# Patient Record
Sex: Female | Born: 1950
Health system: Southern US, Community
[De-identification: ages and names within clinical notes are randomized; demographics above are authoritative.]

## PROBLEM LIST (undated history)

## (undated) DIAGNOSIS — I10 Essential (primary) hypertension: Secondary | ICD-10-CM

## (undated) DIAGNOSIS — T7840XA Allergy, unspecified, initial encounter: Secondary | ICD-10-CM

## (undated) DIAGNOSIS — G709 Myoneural disorder, unspecified: Secondary | ICD-10-CM

## (undated) DIAGNOSIS — H269 Unspecified cataract: Secondary | ICD-10-CM

## (undated) DIAGNOSIS — A048 Other specified bacterial intestinal infections: Secondary | ICD-10-CM

## (undated) DIAGNOSIS — H409 Unspecified glaucoma: Secondary | ICD-10-CM

## (undated) DIAGNOSIS — E1110 Type 2 diabetes mellitus with ketoacidosis without coma: Secondary | ICD-10-CM

## (undated) DIAGNOSIS — M1990 Unspecified osteoarthritis, unspecified site: Secondary | ICD-10-CM

## (undated) DIAGNOSIS — Z8489 Family history of other specified conditions: Secondary | ICD-10-CM

## (undated) DIAGNOSIS — E785 Hyperlipidemia, unspecified: Secondary | ICD-10-CM

## (undated) DIAGNOSIS — R55 Syncope and collapse: Secondary | ICD-10-CM

## (undated) DIAGNOSIS — J101 Influenza due to other identified influenza virus with other respiratory manifestations: Secondary | ICD-10-CM

## (undated) DIAGNOSIS — D649 Anemia, unspecified: Secondary | ICD-10-CM

## (undated) HISTORY — DX: Type 2 diabetes mellitus with ketoacidosis without coma: E11.10

## (undated) HISTORY — DX: Allergy, unspecified, initial encounter: T78.40XA

## (undated) HISTORY — DX: Syncope and collapse: R55

## (undated) HISTORY — DX: Influenza due to other identified influenza virus with other respiratory manifestations: J10.1

## (undated) HISTORY — PX: ABDOMINAL HYSTERECTOMY: SHX81

## (undated) HISTORY — PX: BREAST BIOPSY: SHX20

## (undated) HISTORY — DX: Unspecified glaucoma: H40.9

## (undated) HISTORY — DX: Other specified bacterial intestinal infections: A04.8

## (undated) HISTORY — PX: ROTATOR CUFF REPAIR: SHX139

## (undated) HISTORY — DX: Myoneural disorder, unspecified: G70.9

## (undated) HISTORY — PX: OTHER SURGICAL HISTORY: SHX169

## (undated) HISTORY — DX: Anemia, unspecified: D64.9

## (undated) HISTORY — DX: Unspecified osteoarthritis, unspecified site: M19.90

## (undated) HISTORY — DX: Unspecified cataract: H26.9

## (undated) HISTORY — PX: SHOULDER SURGERY: SHX246

---

## 1998-07-20 ENCOUNTER — Ambulatory Visit (HOSPITAL_COMMUNITY): Admission: RE | Admit: 1998-07-20 | Discharge: 1998-07-20 | Payer: Self-pay | Admitting: *Deleted

## 1998-11-17 ENCOUNTER — Ambulatory Visit (HOSPITAL_COMMUNITY): Admission: RE | Admit: 1998-11-17 | Discharge: 1998-11-17 | Payer: Self-pay | Admitting: General Surgery

## 1998-11-17 ENCOUNTER — Encounter (HOSPITAL_BASED_OUTPATIENT_CLINIC_OR_DEPARTMENT_OTHER): Payer: Self-pay | Admitting: General Surgery

## 1999-03-09 ENCOUNTER — Encounter: Admission: RE | Admit: 1999-03-09 | Discharge: 1999-06-07 | Payer: Self-pay | Admitting: Family Medicine

## 2000-02-16 ENCOUNTER — Other Ambulatory Visit: Admission: RE | Admit: 2000-02-16 | Discharge: 2000-02-16 | Payer: Self-pay | Admitting: Obstetrics & Gynecology

## 2000-02-18 ENCOUNTER — Encounter (HOSPITAL_BASED_OUTPATIENT_CLINIC_OR_DEPARTMENT_OTHER): Payer: Self-pay | Admitting: General Surgery

## 2000-02-18 ENCOUNTER — Ambulatory Visit (HOSPITAL_COMMUNITY): Admission: RE | Admit: 2000-02-18 | Discharge: 2000-02-18 | Payer: Self-pay | Admitting: General Surgery

## 2000-03-22 ENCOUNTER — Encounter: Admission: RE | Admit: 2000-03-22 | Discharge: 2000-06-20 | Payer: Self-pay | Admitting: Family Medicine

## 2001-03-27 ENCOUNTER — Ambulatory Visit (HOSPITAL_COMMUNITY): Admission: RE | Admit: 2001-03-27 | Discharge: 2001-03-27 | Payer: Self-pay | Admitting: General Surgery

## 2001-03-27 ENCOUNTER — Encounter (HOSPITAL_BASED_OUTPATIENT_CLINIC_OR_DEPARTMENT_OTHER): Payer: Self-pay | Admitting: General Surgery

## 2001-05-04 ENCOUNTER — Other Ambulatory Visit: Admission: RE | Admit: 2001-05-04 | Discharge: 2001-05-04 | Payer: Self-pay | Admitting: Obstetrics & Gynecology

## 2002-05-14 ENCOUNTER — Ambulatory Visit (HOSPITAL_COMMUNITY): Admission: RE | Admit: 2002-05-14 | Discharge: 2002-05-14 | Payer: Self-pay | Admitting: Obstetrics & Gynecology

## 2002-05-14 ENCOUNTER — Encounter: Payer: Self-pay | Admitting: Obstetrics & Gynecology

## 2002-09-16 ENCOUNTER — Other Ambulatory Visit: Admission: RE | Admit: 2002-09-16 | Discharge: 2002-09-16 | Payer: Self-pay | Admitting: Obstetrics & Gynecology

## 2003-10-14 ENCOUNTER — Ambulatory Visit (HOSPITAL_COMMUNITY): Admission: RE | Admit: 2003-10-14 | Discharge: 2003-10-14 | Payer: Self-pay | Admitting: Obstetrics & Gynecology

## 2003-10-24 ENCOUNTER — Other Ambulatory Visit: Admission: RE | Admit: 2003-10-24 | Discharge: 2003-10-24 | Payer: Self-pay | Admitting: Obstetrics & Gynecology

## 2003-11-10 ENCOUNTER — Ambulatory Visit (HOSPITAL_COMMUNITY): Admission: RE | Admit: 2003-11-10 | Discharge: 2003-11-10 | Payer: Self-pay | Admitting: Internal Medicine

## 2003-12-02 ENCOUNTER — Encounter (INDEPENDENT_AMBULATORY_CARE_PROVIDER_SITE_OTHER): Payer: Self-pay | Admitting: Specialist

## 2003-12-02 ENCOUNTER — Encounter (INDEPENDENT_AMBULATORY_CARE_PROVIDER_SITE_OTHER): Payer: Self-pay

## 2003-12-02 ENCOUNTER — Inpatient Hospital Stay (HOSPITAL_COMMUNITY): Admission: RE | Admit: 2003-12-02 | Discharge: 2003-12-04 | Payer: Self-pay | Admitting: Obstetrics & Gynecology

## 2005-09-15 ENCOUNTER — Ambulatory Visit: Payer: Self-pay | Admitting: Internal Medicine

## 2005-09-19 ENCOUNTER — Ambulatory Visit: Payer: Self-pay | Admitting: Hospitalist

## 2005-09-26 ENCOUNTER — Ambulatory Visit (HOSPITAL_COMMUNITY): Admission: RE | Admit: 2005-09-26 | Discharge: 2005-09-26 | Payer: Self-pay | Admitting: Nephrology

## 2006-08-08 ENCOUNTER — Emergency Department (HOSPITAL_COMMUNITY): Admission: EM | Admit: 2006-08-08 | Discharge: 2006-08-08 | Payer: Self-pay | Admitting: Emergency Medicine

## 2006-10-09 ENCOUNTER — Emergency Department (HOSPITAL_COMMUNITY): Admission: EM | Admit: 2006-10-09 | Discharge: 2006-10-09 | Payer: Self-pay | Admitting: Family Medicine

## 2006-11-15 ENCOUNTER — Ambulatory Visit: Payer: Self-pay | Admitting: Endocrinology

## 2006-12-15 ENCOUNTER — Ambulatory Visit: Payer: Self-pay | Admitting: Endocrinology

## 2007-01-02 ENCOUNTER — Ambulatory Visit: Payer: Self-pay | Admitting: Endocrinology

## 2007-01-02 LAB — CONVERTED CEMR LAB
ALT: 11 units/L (ref 0–40)
AST: 15 units/L (ref 0–37)
Albumin: 3.8 g/dL (ref 3.5–5.2)
Alkaline Phosphatase: 38 units/L — ABNORMAL LOW (ref 39–117)
Amylase: 53 units/L (ref 27–131)
Bilirubin, Direct: 0.1 mg/dL (ref 0.0–0.3)
HCT: 43.3 % (ref 36.0–46.0)
Hemoglobin: 14.8 g/dL (ref 12.0–15.0)
MCHC: 34.1 g/dL (ref 30.0–36.0)
MCV: 91 fL (ref 78.0–100.0)
Platelets: 421 10*3/uL — ABNORMAL HIGH (ref 150–400)
RBC: 4.76 M/uL (ref 3.87–5.11)
RDW: 12.2 % (ref 11.5–14.6)
Total Bilirubin: 1 mg/dL (ref 0.3–1.2)
Total Protein: 7 g/dL (ref 6.0–8.3)
WBC: 5.9 10*3/uL (ref 4.5–10.5)

## 2007-01-27 ENCOUNTER — Observation Stay (HOSPITAL_COMMUNITY): Admission: EM | Admit: 2007-01-27 | Discharge: 2007-01-28 | Payer: Self-pay | Admitting: Emergency Medicine

## 2007-01-28 ENCOUNTER — Ambulatory Visit: Payer: Self-pay | Admitting: Internal Medicine

## 2007-01-29 ENCOUNTER — Ambulatory Visit: Payer: Self-pay | Admitting: Endocrinology

## 2007-02-09 ENCOUNTER — Ambulatory Visit: Payer: Self-pay

## 2007-02-12 ENCOUNTER — Ambulatory Visit: Payer: Self-pay | Admitting: Endocrinology

## 2007-02-26 ENCOUNTER — Ambulatory Visit: Payer: Self-pay | Admitting: Endocrinology

## 2007-02-26 LAB — CONVERTED CEMR LAB
ALT: 10 units/L (ref 0–40)
AST: 14 units/L (ref 0–37)
Albumin: 3.5 g/dL (ref 3.5–5.2)
Alkaline Phosphatase: 39 units/L (ref 39–117)
BUN: 10 mg/dL (ref 6–23)
Bilirubin, Direct: 0.2 mg/dL (ref 0.0–0.3)
CO2: 32 meq/L (ref 19–32)
Calcium: 9.1 mg/dL (ref 8.4–10.5)
Chloride: 107 meq/L (ref 96–112)
Cholesterol: 126 mg/dL (ref 0–200)
Creatinine, Ser: 0.6 mg/dL (ref 0.4–1.2)
GFR calc Af Amer: 134 mL/min
GFR calc non Af Amer: 111 mL/min
Glucose, Bld: 172 mg/dL — ABNORMAL HIGH (ref 70–99)
HDL: 42.5 mg/dL (ref >39.0)
Hgb A1c MFr Bld: 6.9 % — ABNORMAL HIGH (ref 4.6–6.0)
LDL Cholesterol: 77 mg/dL (ref 0–99)
Potassium: 3.7 meq/L (ref 3.5–5.1)
Sed Rate: 7 mm/hr (ref 0–25)
Sodium: 145 meq/L (ref 135–145)
Total Bilirubin: 1.1 mg/dL (ref 0.3–1.2)
Total CHOL/HDL Ratio: 3
Total Protein: 6.2 g/dL (ref 6.0–8.3)
Triglycerides: 33 mg/dL (ref 0–149)
VLDL: 7 mg/dL (ref 0–40)

## 2007-03-15 ENCOUNTER — Ambulatory Visit: Payer: Self-pay | Admitting: Obstetrics & Gynecology

## 2008-01-14 ENCOUNTER — Ambulatory Visit (HOSPITAL_COMMUNITY): Admission: RE | Admit: 2008-01-14 | Discharge: 2008-01-14 | Payer: Self-pay | Admitting: Obstetrics & Gynecology

## 2010-03-16 ENCOUNTER — Emergency Department (HOSPITAL_COMMUNITY): Admission: EM | Admit: 2010-03-16 | Discharge: 2010-03-16 | Payer: Self-pay | Admitting: Emergency Medicine

## 2010-03-22 ENCOUNTER — Emergency Department (HOSPITAL_COMMUNITY): Admission: EM | Admit: 2010-03-22 | Discharge: 2010-03-22 | Payer: Self-pay | Admitting: Emergency Medicine

## 2010-03-26 ENCOUNTER — Inpatient Hospital Stay (HOSPITAL_COMMUNITY): Admission: EM | Admit: 2010-03-26 | Discharge: 2010-03-29 | Payer: Self-pay | Admitting: Emergency Medicine

## 2010-03-29 ENCOUNTER — Ambulatory Visit: Payer: Self-pay | Admitting: Vascular Surgery

## 2010-04-11 ENCOUNTER — Inpatient Hospital Stay (HOSPITAL_COMMUNITY): Admission: EM | Admit: 2010-04-11 | Discharge: 2010-04-12 | Payer: Self-pay | Admitting: Emergency Medicine

## 2010-05-10 ENCOUNTER — Ambulatory Visit: Payer: Self-pay | Admitting: Internal Medicine

## 2010-05-10 DIAGNOSIS — R55 Syncope and collapse: Secondary | ICD-10-CM | POA: Insufficient documentation

## 2010-05-10 DIAGNOSIS — I10 Essential (primary) hypertension: Secondary | ICD-10-CM | POA: Insufficient documentation

## 2010-05-10 HISTORY — DX: Syncope and collapse: R55

## 2010-05-11 LAB — CONVERTED CEMR LAB
Bilirubin Urine: NEGATIVE
Hemoglobin, Urine: NEGATIVE
Ketones, ur: NEGATIVE mg/dL
Leukocytes, UA: NEGATIVE
Nitrite: NEGATIVE
Specific Gravity, Urine: 1.02 (ref 1.000–1.030)
Urine Glucose: NEGATIVE mg/dL
Urobilinogen, UA: 0.2 (ref 0.0–1.0)
pH: 7.5 (ref 5.0–8.0)

## 2010-10-07 ENCOUNTER — Emergency Department (HOSPITAL_COMMUNITY): Admission: EM | Admit: 2010-10-07 | Discharge: 2010-10-07 | Payer: Self-pay | Admitting: Emergency Medicine

## 2010-11-11 IMAGING — CR DG CHEST 2V
2 series · 2 of 2 positions shown · non-contrast
Comparison: Chest x-ray of 03/26/2010

CLINICAL DATA: Dizziness, near-syncope

CHEST - 2 VIEW

[w chest lat]
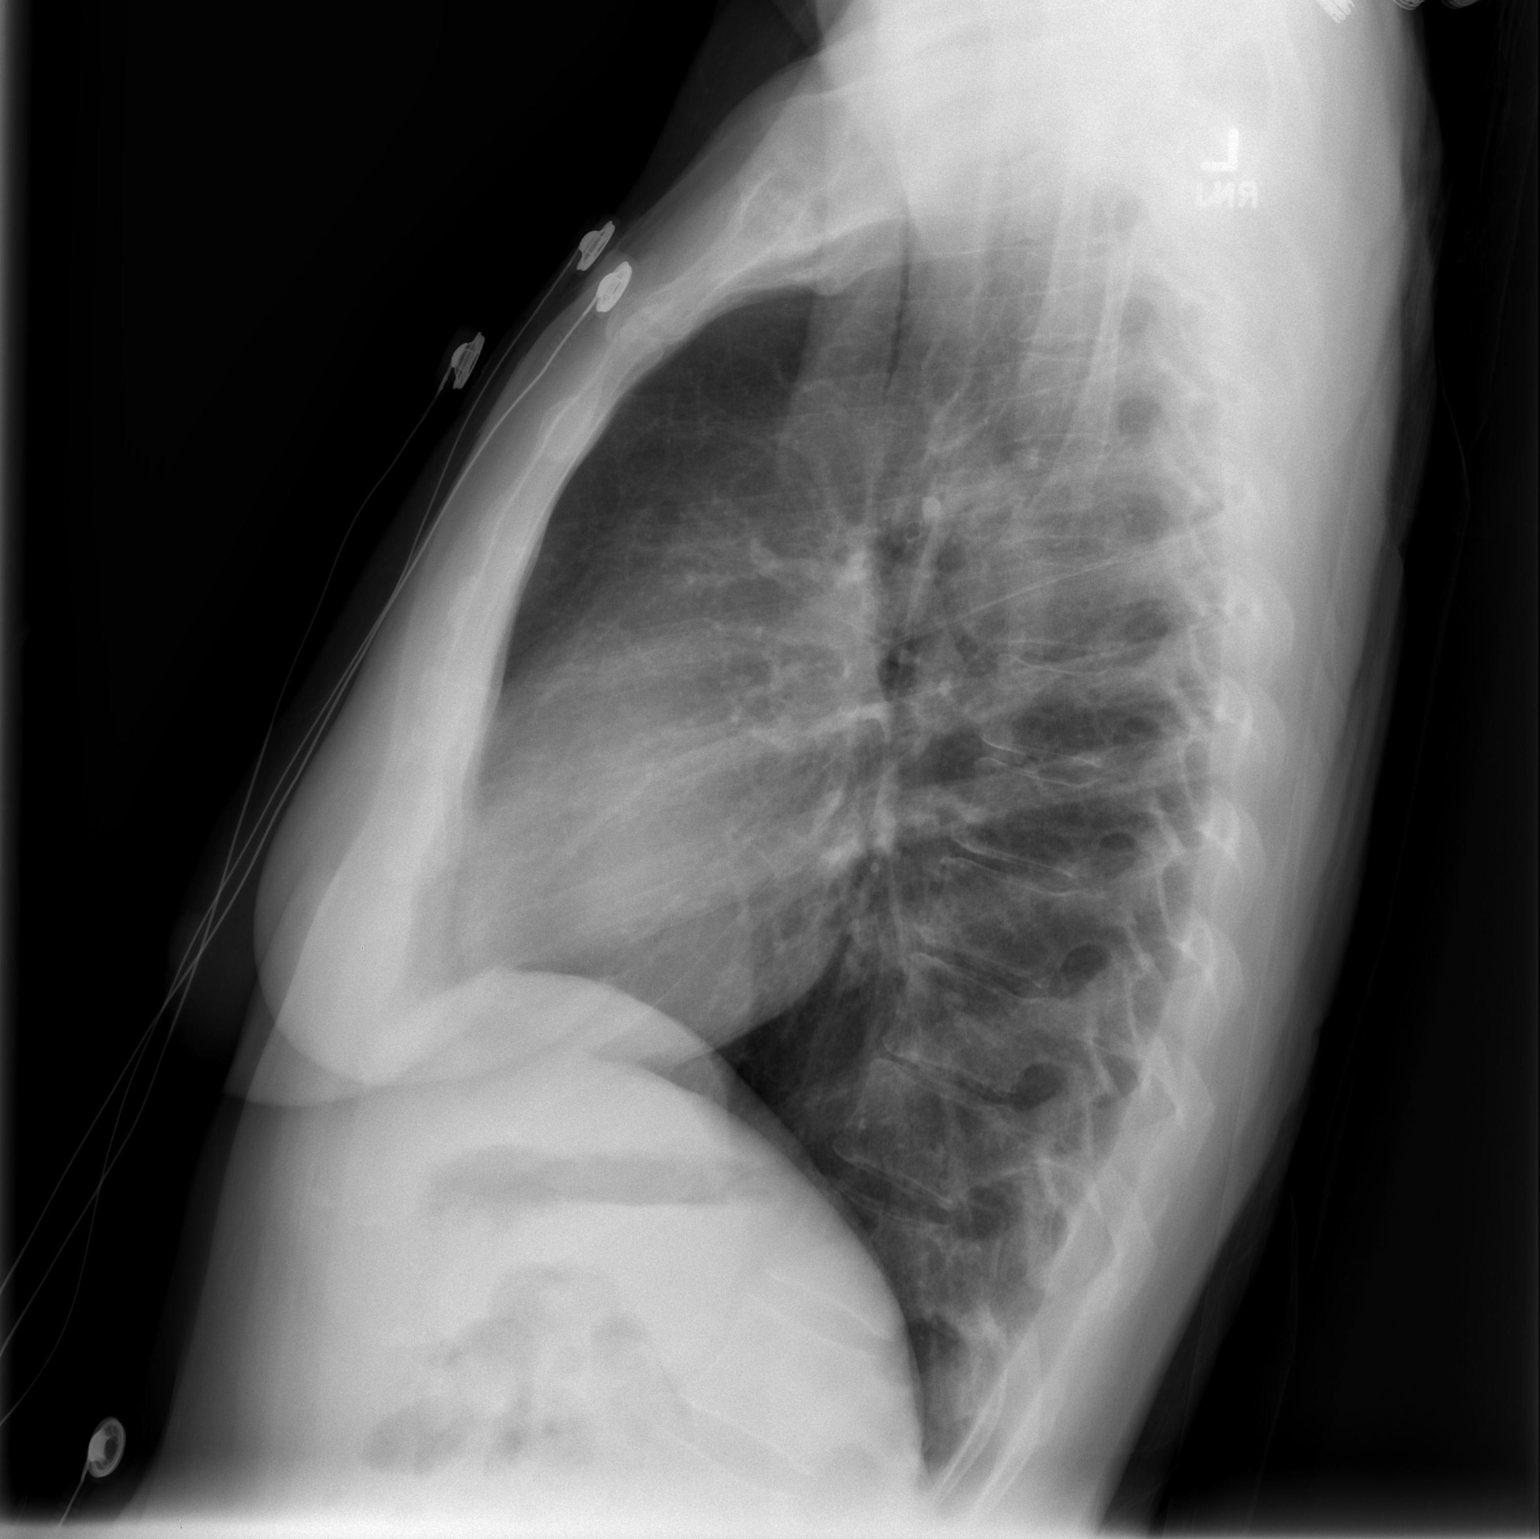

[view not recorded]
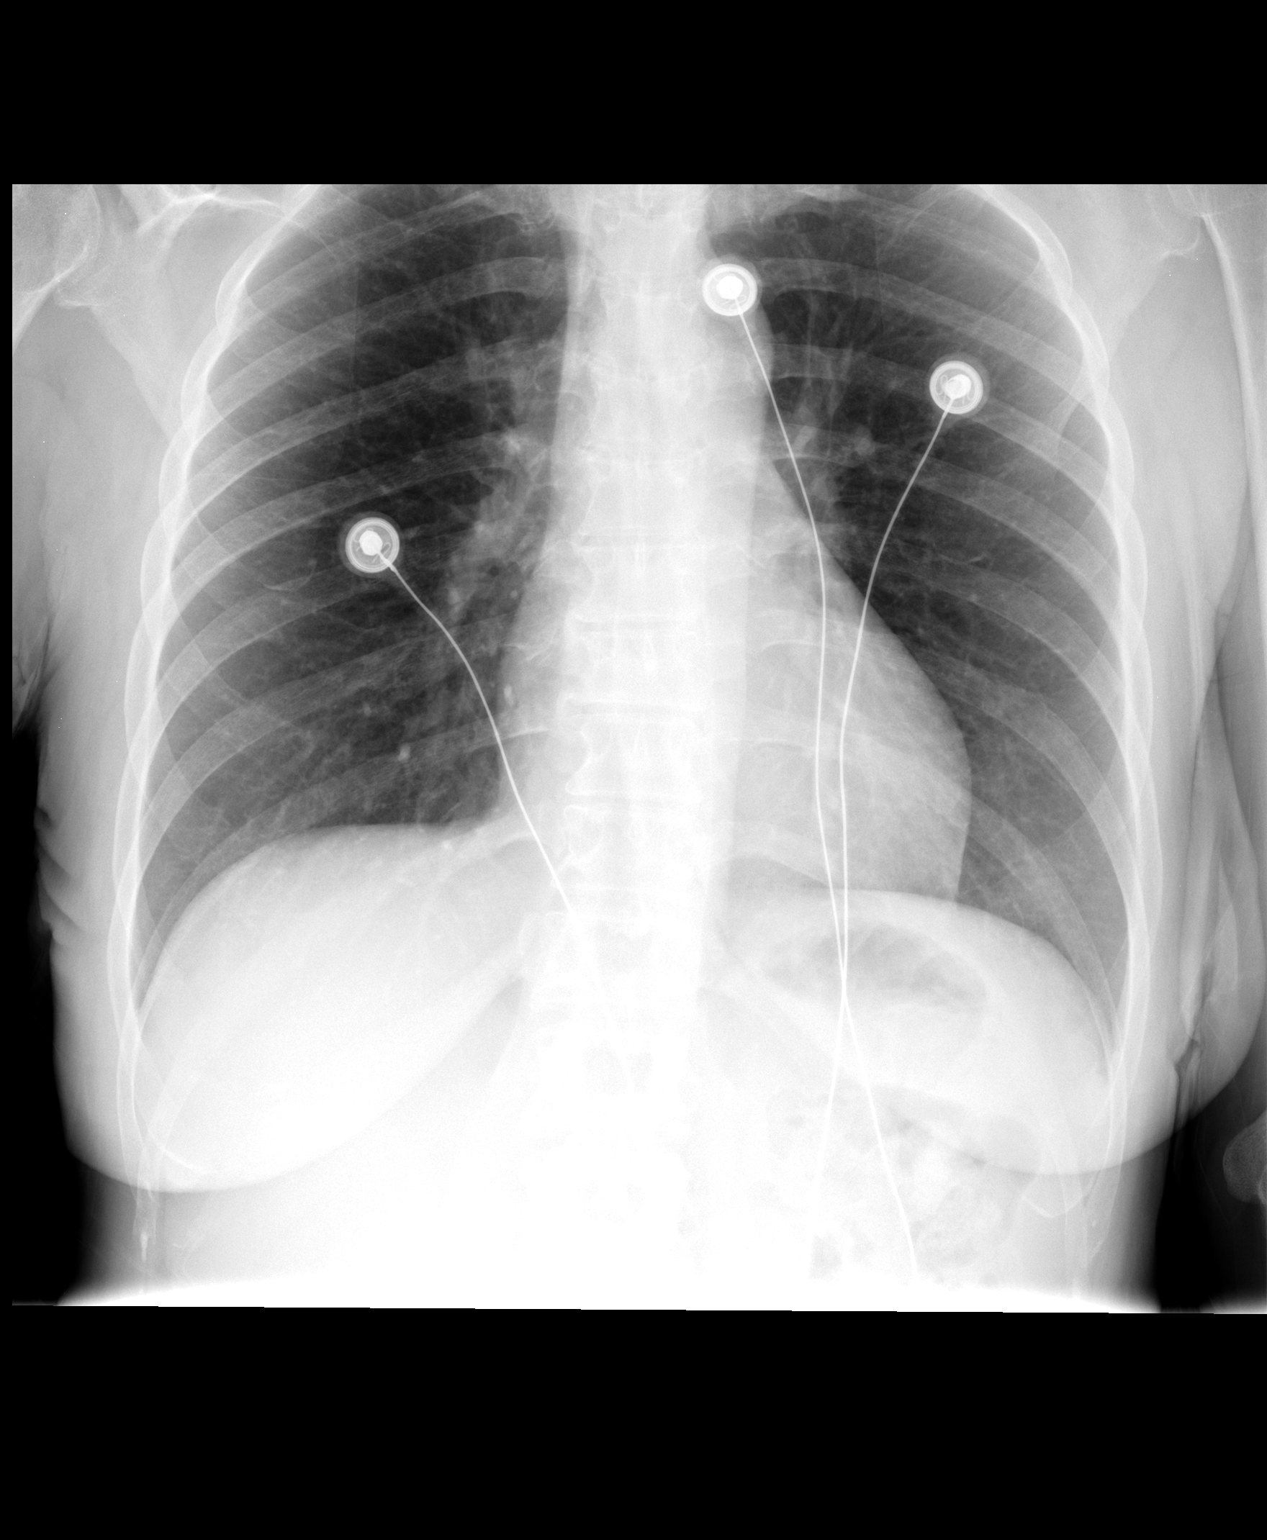

[2 of 2 positions shown; findings below may reference images not displayed]

FINDINGS: The lungs are clear.  Mediastinal contours are stable.
The heart is within normal limits in size.  No bony abnormality is
seen.
IMPRESSION: No active lung disease.

## 2011-01-18 NOTE — Assessment & Plan Note (Signed)
Summary: np6 / self referral - concern with blood pressure/ pt is self...   Visit Type:  Follow-up Primary Provider:  Dr Jackson Latino  CC:  pt has some dizzines and eposides of passing out.She also has occ sob.  History of Present Illness: Patient is a 60 year old who was self referred for evaluation of syncope. The patient says over the past few months she has had increased problems with dizziness, syncope.  Last admitted on 4/8.  Hydrated.  Last seen in ER on 4/24.  ON this visit urine was diluet.  In ER orhtostatic bp:  lying 166/101, pulse 99; sitting:  BP 129/76, pulse 112;  standing:  BP 97/67; pulse 118.   The patient is still taking lisinopril.  Her last syncopal spell was last Thursday.  Alll occur while she is standing.   She feels tired all the time, dizzy alot.  No chest pain.  Says she is drinking fluids.   Review of the electronic record notes syncope in 2008.  She was on diuretic at the time. Note that she has been on Gabapentin for a couple months  she is not sure why.  Current Medications (verified): 1)  Pravachol 20 Mg Tabs (Pravastatin Sodium) .[name].. 1 Atb Once Daily 2)  Alive .... Daily 3)  Probiotic .... Daily 4)  Neurontin 300 Mg Caps (Gabapentin) .[name].. 1 Tab Three Times A Day 5)  Aspirin 81 Mg  Tabs (Aspirin) .[name].. 1 Tab Once Daily 6)  Culturelle  Probiotic .... Daily  Allergies (verified): No Known Drug Allergies  Past History:  Family History: Last updated: 05/07/2010  Significant for her mother who has hypertension, diabetes mellitus as well as osteoporosis, her father who died at 66 years old with a brain aneurysm, her brother who has lung cancer at 54,  her sister who has lupus and multiple other siblings who have diabetes  mellitus and hypertension.   Social History: Last updated: 05/07/2010 Negative for drugs, alcohol and tobacco.  She is  married with a supportive husband and family.  She is employed as a Charity fundraiser at [employer].   Past Medical  History: Hypertension Diabetes mellitus Hyperlipidemia GERD Syncope  Past Surgical History: subtotal TAH and BSO. Shoulder surgery.  Review of Systems       ALl systems reviewed.  Negative to the above problem except as noted above.  Vital Signs:  Patient profile:   60 year old female Height:      61 inches Weight:      118 pounds BMI:     22.38 Pulse (ortho):   103 / minute BP sitting:   131 / 83  (left arm) BP standing:   75 / 56 Cuff size:   regular  Vitals Entered By: Lubertha Basque, CNA (May 10, 2010 10:11 AM)  Serial Vital Signs/Assessments:  Time      Position  BP       Pulse  Resp  Temp     By 0 min     Lying LA  149/97   94                    Lubertha Basque, CNA 0 min     Sitting   131/83   101                   Lubertha Basque, CNA 0 min     Standing  75/56    103  Lubertha Basque, CNA  Comments: 0 min pt is having dizziness throught othrostats- she states like it feels something crawling on her skin-after standing about one min pt had to sit down By: Lubertha Basque, CNA    Physical Exam  Additional Exam:  Patient is a thin 60 year old in NAD HEENT:  Normocephalic, atraumatic. EOMI, PERRLA.  Neck: JVP is normal. No thyromegaly. No bruits.  Lungs: clear to auscultation. No rales no wheezes.  Heart: Regular rate and rhythm. Normal S1, S2. No S3.   No significant murmurs. PMI not displaced.  Abdomen:  Supple, nontender. Normal bowel sounds. No masses. No hepatomegaly.  Extremities:   Good distal pulses throughout. No lower extremity edema.  Musculoskeletal :moving all extremities.  Neuro:   alert and oriented x3.    EKG  Procedure date:  05/10/2010  Findings:      NSR.  97 bpm.  Impression & Recommendations:  Problem # 1:  SYNCOPE (ICD-780.2) Patient is orthostatic today.  I would recommend checking urine for specific gravity. I encouraged increase fluid intake. I would stop lisinopril and start Pndolol.   Review of labs from ER  show no significant abnormality.  Problem # 2:  HYPERTENSION, BENIGN (ICD-401.1) Will need to follow.  Other Orders: EKG w/ Interpretation (93000) TLB-Udip ONLY (81003-UDIP)  Patient Instructions: 1)  Your physician has recommended you make the following change in your medication: decrease Neurontin to 1 time per day for 1 week then stop 2)  Your physician recommends that you return for lab work in: urine test today....we will call you with results 3)  Increase water and salt intake 4)  Your physician recommends that you schedule a follow-up appointment in: 6 weeks Prescriptions: PINDOLOL 5 MG TABS (PINDOLOL) one half a tab 2 times per day  #15 x 6   Entered by:   Francetta Found, RN, BSN   Authorized by:   Annye Rusk, MD, [facility]   Signed by:   Francetta Found, RN, BSN on 05/10/2010   Method used:   Electronically to        [facility] (retail)       [address]       [place], [place]  [address]       Ph: [phone]       Fax: [phone]   RxID:   [id number]

## 2011-03-08 LAB — POCT CARDIAC MARKERS
CKMB, poc: <1 ng/mL — ABNORMAL LOW (ref 1.0–8.0)
Myoglobin, poc: 45.2 ng/mL (ref 12–200)
Troponin i, poc: <0.05 ng/mL (ref 0.00–0.09)

## 2011-03-08 LAB — GLUCOSE, CAPILLARY
Glucose-Capillary: 114 mg/dL — ABNORMAL HIGH (ref 70–99)
Glucose-Capillary: 120 mg/dL — ABNORMAL HIGH (ref 70–99)
Glucose-Capillary: 129 mg/dL — ABNORMAL HIGH (ref 70–99)
Glucose-Capillary: 138 mg/dL — ABNORMAL HIGH (ref 70–99)
Glucose-Capillary: 141 mg/dL — ABNORMAL HIGH (ref 70–99)
Glucose-Capillary: 142 mg/dL — ABNORMAL HIGH (ref 70–99)
Glucose-Capillary: 171 mg/dL — ABNORMAL HIGH (ref 70–99)
Glucose-Capillary: 63 mg/dL — ABNORMAL LOW (ref 70–99)
Glucose-Capillary: 83 mg/dL (ref 70–99)
Glucose-Capillary: 94 mg/dL (ref 70–99)

## 2011-03-08 LAB — DIFFERENTIAL
Basophils Absolute: 0 10*3/uL (ref 0.0–0.1)
Basophils Relative: 0 % (ref 0–1)
Eosinophils Absolute: 0.1 10*3/uL (ref 0.0–0.7)
Eosinophils Relative: 1 % (ref 0–5)
Lymphocytes Relative: 21 % (ref 12–46)
Lymphs Abs: 1.8 10*3/uL (ref 0.7–4.0)
Monocytes Absolute: 0.4 10*3/uL (ref 0.1–1.0)
Monocytes Relative: 5 % (ref 3–12)
Neutro Abs: 6 10*3/uL (ref 1.7–7.7)
Neutrophils Relative %: 72 % (ref 43–77)

## 2011-03-08 LAB — URINALYSIS, ROUTINE W REFLEX MICROSCOPIC
Bilirubin Urine: NEGATIVE
Glucose, UA: NEGATIVE mg/dL
Hgb urine dipstick: NEGATIVE
Ketones, ur: 40 mg/dL — AB
Nitrite: NEGATIVE
Protein, ur: NEGATIVE mg/dL
Specific Gravity, Urine: 1.009 (ref 1.005–1.030)
Urobilinogen, UA: 1 mg/dL (ref 0.0–1.0)
pH: 7 (ref 5.0–8.0)

## 2011-03-08 LAB — BASIC METABOLIC PANEL
BUN: 16 mg/dL (ref 6–23)
CO2: 27 mEq/L (ref 19–32)
Calcium: 9.4 mg/dL (ref 8.4–10.5)
Chloride: 104 mEq/L (ref 96–112)
Creatinine, Ser: 0.73 mg/dL (ref 0.4–1.2)
GFR calc Af Amer: >60 mL/min (ref >60)
GFR calc non Af Amer: >60 mL/min (ref >60)
Glucose, Bld: 170 mg/dL — ABNORMAL HIGH (ref 70–99)
Potassium: 4 mEq/L (ref 3.5–5.1)
Sodium: 138 mEq/L (ref 135–145)

## 2011-03-08 LAB — CBC
HCT: 40.9 % (ref 36.0–46.0)
Hemoglobin: 14.1 g/dL (ref 12.0–15.0)
MCHC: 34.3 g/dL (ref 30.0–36.0)
MCV: 91.4 fL (ref 78.0–100.0)
Platelets: 451 10*3/uL — ABNORMAL HIGH (ref 150–400)
RBC: 4.48 MIL/uL (ref 3.87–5.11)
RDW: 12.9 % (ref 11.5–15.5)
WBC: 8.3 10*3/uL (ref 4.0–10.5)

## 2011-03-08 LAB — URINE CULTURE
Colony Count: NO GROWTH
Culture: NO GROWTH

## 2011-03-08 LAB — COMPREHENSIVE METABOLIC PANEL
ALT: 12 U/L (ref 0–35)
AST: 14 U/L (ref 0–37)
Albumin: 3.3 g/dL — ABNORMAL LOW (ref 3.5–5.2)
Alkaline Phosphatase: 34 U/L — ABNORMAL LOW (ref 39–117)
BUN: 10 mg/dL (ref 6–23)
CO2: 29 mEq/L (ref 19–32)
Calcium: 9 mg/dL (ref 8.4–10.5)
Chloride: 109 mEq/L (ref 96–112)
Creatinine, Ser: 0.67 mg/dL (ref 0.4–1.2)
GFR calc Af Amer: >60 mL/min (ref >60)
GFR calc non Af Amer: >60 mL/min (ref >60)
Glucose, Bld: 141 mg/dL — ABNORMAL HIGH (ref 70–99)
Potassium: 4.3 mEq/L (ref 3.5–5.1)
Sodium: 141 mEq/L (ref 135–145)
Total Bilirubin: 0.9 mg/dL (ref 0.3–1.2)
Total Protein: 6.1 g/dL (ref 6.0–8.3)

## 2011-03-08 LAB — ETHANOL: Alcohol, Ethyl (B): <5 mg/dL (ref 0–10)

## 2011-03-08 LAB — RAPID URINE DRUG SCREEN, HOSP PERFORMED
Amphetamines: NOT DETECTED
Barbiturates: NOT DETECTED
Benzodiazepines: NOT DETECTED
Cocaine: NOT DETECTED
Opiates: NOT DETECTED
Tetrahydrocannabinol: NOT DETECTED

## 2011-03-08 LAB — D-DIMER, QUANTITATIVE (NOT AT ARMC): D-Dimer, Quant: 0.35 ug/mL-FEU (ref 0.00–0.48)

## 2011-03-08 LAB — LACTIC ACID, PLASMA: Lactic Acid, Venous: 1.6 mmol/L (ref 0.5–2.2)

## 2011-03-09 LAB — BASIC METABOLIC PANEL
BUN: 10 mg/dL (ref 6–23)
BUN: 15 mg/dL (ref 6–23)
BUN: 8 mg/dL (ref 6–23)
BUN: 9 mg/dL (ref 6–23)
CO2: 22 mEq/L (ref 19–32)
CO2: 26 mEq/L (ref 19–32)
CO2: 27 mEq/L (ref 19–32)
CO2: 28 mEq/L (ref 19–32)
Calcium: 8.3 mg/dL — ABNORMAL LOW (ref 8.4–10.5)
Calcium: 8.4 mg/dL (ref 8.4–10.5)
Calcium: 8.5 mg/dL (ref 8.4–10.5)
Calcium: 8.9 mg/dL (ref 8.4–10.5)
Chloride: 109 mEq/L (ref 96–112)
Chloride: 109 mEq/L (ref 96–112)
Chloride: 111 mEq/L (ref 96–112)
Chloride: 111 mEq/L (ref 96–112)
Creatinine, Ser: 0.51 mg/dL (ref 0.4–1.2)
Creatinine, Ser: 0.57 mg/dL (ref 0.4–1.2)
Creatinine, Ser: 0.62 mg/dL (ref 0.4–1.2)
Creatinine, Ser: 0.63 mg/dL (ref 0.4–1.2)
GFR calc Af Amer: >60 mL/min (ref >60)
GFR calc Af Amer: >60 mL/min (ref >60)
GFR calc Af Amer: >60 mL/min (ref >60)
GFR calc Af Amer: >60 mL/min (ref >60)
GFR calc non Af Amer: >60 mL/min (ref >60)
GFR calc non Af Amer: >60 mL/min (ref >60)
GFR calc non Af Amer: >60 mL/min (ref >60)
GFR calc non Af Amer: >60 mL/min (ref >60)
Glucose, Bld: 150 mg/dL — ABNORMAL HIGH (ref 70–99)
Glucose, Bld: 153 mg/dL — ABNORMAL HIGH (ref 70–99)
Glucose, Bld: 165 mg/dL — ABNORMAL HIGH (ref 70–99)
Glucose, Bld: 180 mg/dL — ABNORMAL HIGH (ref 70–99)
Potassium: 3.3 mEq/L — ABNORMAL LOW (ref 3.5–5.1)
Potassium: 3.5 mEq/L (ref 3.5–5.1)
Potassium: 3.5 mEq/L (ref 3.5–5.1)
Potassium: 4.7 mEq/L (ref 3.5–5.1)
Sodium: 139 mEq/L (ref 135–145)
Sodium: 140 mEq/L (ref 135–145)
Sodium: 140 mEq/L (ref 135–145)
Sodium: 142 mEq/L (ref 135–145)

## 2011-03-09 LAB — GLUCOSE, CAPILLARY
Glucose-Capillary: 138 mg/dL — ABNORMAL HIGH (ref 70–99)
Glucose-Capillary: 147 mg/dL — ABNORMAL HIGH (ref 70–99)
Glucose-Capillary: 148 mg/dL — ABNORMAL HIGH (ref 70–99)
Glucose-Capillary: 155 mg/dL — ABNORMAL HIGH (ref 70–99)
Glucose-Capillary: 157 mg/dL — ABNORMAL HIGH (ref 70–99)
Glucose-Capillary: 161 mg/dL — ABNORMAL HIGH (ref 70–99)
Glucose-Capillary: 164 mg/dL — ABNORMAL HIGH (ref 70–99)
Glucose-Capillary: 168 mg/dL — ABNORMAL HIGH (ref 70–99)
Glucose-Capillary: 169 mg/dL — ABNORMAL HIGH (ref 70–99)
Glucose-Capillary: 173 mg/dL — ABNORMAL HIGH (ref 70–99)
Glucose-Capillary: 177 mg/dL — ABNORMAL HIGH (ref 70–99)
Glucose-Capillary: 180 mg/dL — ABNORMAL HIGH (ref 70–99)
Glucose-Capillary: 200 mg/dL — ABNORMAL HIGH (ref 70–99)
Glucose-Capillary: 202 mg/dL — ABNORMAL HIGH (ref 70–99)
Glucose-Capillary: 208 mg/dL — ABNORMAL HIGH (ref 70–99)
Glucose-Capillary: 213 mg/dL — ABNORMAL HIGH (ref 70–99)
Glucose-Capillary: 228 mg/dL — ABNORMAL HIGH (ref 70–99)
Glucose-Capillary: 179 mg/dL — ABNORMAL HIGH (ref 70–99)
Glucose-Capillary: 225 mg/dL — ABNORMAL HIGH (ref 70–99)

## 2011-03-09 LAB — POCT I-STAT, CHEM 8
BUN: 24 mg/dL — ABNORMAL HIGH (ref 6–23)
Calcium, Ion: 1.11 mmol/L — ABNORMAL LOW (ref 1.12–1.32)
Chloride: 111 mEq/L (ref 96–112)
Creatinine, Ser: 0.6 mg/dL (ref 0.4–1.2)
Glucose, Bld: 138 mg/dL — ABNORMAL HIGH (ref 70–99)
HCT: 37 % (ref 36.0–46.0)
Hemoglobin: 12.6 g/dL (ref 12.0–15.0)
Potassium: 3.6 mEq/L (ref 3.5–5.1)
Sodium: 143 mEq/L (ref 135–145)
TCO2: 25 mmol/L (ref 0–100)

## 2011-03-09 LAB — URINALYSIS, ROUTINE W REFLEX MICROSCOPIC
Bilirubin Urine: NEGATIVE
Bilirubin Urine: NEGATIVE
Glucose, UA: NEGATIVE mg/dL
Glucose, UA: 250 mg/dL — AB
Hgb urine dipstick: NEGATIVE
Hgb urine dipstick: NEGATIVE
Ketones, ur: NEGATIVE mg/dL
Ketones, ur: NEGATIVE mg/dL
Nitrite: NEGATIVE
Nitrite: NEGATIVE
Protein, ur: NEGATIVE mg/dL
Protein, ur: NEGATIVE mg/dL
Specific Gravity, Urine: 1.012 (ref 1.005–1.030)
Specific Gravity, Urine: 1.009 (ref 1.005–1.030)
Urobilinogen, UA: 0.2 mg/dL (ref 0.0–1.0)
Urobilinogen, UA: 0.2 mg/dL (ref 0.0–1.0)
pH: 6.5 (ref 5.0–8.0)
pH: 7 (ref 5.0–8.0)

## 2011-03-09 LAB — CARDIAC PANEL(CRET KIN+CKTOT+MB+TROPI)
CK, MB: 0.3 ng/mL (ref 0.3–4.0)
CK, MB: 0.3 ng/mL (ref 0.3–4.0)
CK, MB: 0.4 ng/mL (ref 0.3–4.0)
Relative Index: INVALID (ref 0.0–2.5)
Relative Index: INVALID (ref 0.0–2.5)
Relative Index: INVALID (ref 0.0–2.5)
Total CK: 40 U/L (ref 7–177)
Total CK: 45 U/L (ref 7–177)
Total CK: 45 U/L (ref 7–177)
Troponin I: 0.01 ng/mL (ref 0.00–0.06)
Troponin I: 0.02 ng/mL (ref 0.00–0.06)
Troponin I: 0.02 ng/mL (ref 0.00–0.06)

## 2011-03-09 LAB — HEPATIC FUNCTION PANEL
ALT: 12 U/L (ref 0–35)
ALT: 9 U/L (ref 0–35)
AST: 12 U/L (ref 0–37)
AST: 14 U/L (ref 0–37)
Albumin: 3.3 g/dL — ABNORMAL LOW (ref 3.5–5.2)
Albumin: 3.3 g/dL — ABNORMAL LOW (ref 3.5–5.2)
Alkaline Phosphatase: 35 U/L — ABNORMAL LOW (ref 39–117)
Alkaline Phosphatase: 36 U/L — ABNORMAL LOW (ref 39–117)
Bilirubin, Direct: 0.1 mg/dL (ref 0.0–0.3)
Bilirubin, Direct: 0.2 mg/dL (ref 0.0–0.3)
Indirect Bilirubin: 0.5 mg/dL (ref 0.3–0.9)
Indirect Bilirubin: 0.6 mg/dL (ref 0.3–0.9)
Total Bilirubin: 0.7 mg/dL (ref 0.3–1.2)
Total Bilirubin: 0.7 mg/dL (ref 0.3–1.2)
Total Protein: 6.1 g/dL (ref 6.0–8.3)
Total Protein: 6.1 g/dL (ref 6.0–8.3)

## 2011-03-09 LAB — PHOSPHORUS: Phosphorus: 2.9 mg/dL (ref 2.3–4.6)

## 2011-03-09 LAB — TSH: TSH: 2.651 u[IU]/mL (ref 0.350–4.500)

## 2011-03-09 LAB — DIFFERENTIAL
Basophils Absolute: 0 10*3/uL (ref 0.0–0.1)
Basophils Relative: 0 % (ref 0–1)
Eosinophils Absolute: 0 10*3/uL (ref 0.0–0.7)
Eosinophils Relative: 0 % (ref 0–5)
Lymphocytes Relative: 37 % (ref 12–46)
Lymphs Abs: 1.9 10*3/uL (ref 0.7–4.0)
Monocytes Absolute: 0.5 10*3/uL (ref 0.1–1.0)
Monocytes Relative: 11 % (ref 3–12)
Neutro Abs: 2.6 10*3/uL (ref 1.7–7.7)
Neutrophils Relative %: 52 % (ref 43–77)

## 2011-03-09 LAB — CBC
HCT: 33.6 % — ABNORMAL LOW (ref 36.0–46.0)
HCT: 34.8 % — ABNORMAL LOW (ref 36.0–46.0)
HCT: 37 % (ref 36.0–46.0)
HCT: 37.3 % (ref 36.0–46.0)
Hemoglobin: 11.7 g/dL — ABNORMAL LOW (ref 12.0–15.0)
Hemoglobin: 12.3 g/dL (ref 12.0–15.0)
Hemoglobin: 12.5 g/dL (ref 12.0–15.0)
Hemoglobin: 13.1 g/dL (ref 12.0–15.0)
MCHC: 33.9 g/dL (ref 30.0–36.0)
MCHC: 34.9 g/dL (ref 30.0–36.0)
MCHC: 35.2 g/dL (ref 30.0–36.0)
MCHC: 35.3 g/dL (ref 30.0–36.0)
MCV: 90.1 fL (ref 78.0–100.0)
MCV: 91 fL (ref 78.0–100.0)
MCV: 91.5 fL (ref 78.0–100.0)
MCV: 92.2 fL (ref 78.0–100.0)
Platelets: 284 10*3/uL (ref 150–400)
Platelets: 295 10*3/uL (ref 150–400)
Platelets: 310 10*3/uL (ref 150–400)
Platelets: 332 10*3/uL (ref 150–400)
RBC: 3.67 MIL/uL — ABNORMAL LOW (ref 3.87–5.11)
RBC: 3.83 MIL/uL — ABNORMAL LOW (ref 3.87–5.11)
RBC: 4.01 MIL/uL (ref 3.87–5.11)
RBC: 4.13 MIL/uL (ref 3.87–5.11)
RDW: 12.4 % (ref 11.5–15.5)
RDW: 12.5 % (ref 11.5–15.5)
RDW: 12.6 % (ref 11.5–15.5)
RDW: 12.7 % (ref 11.5–15.5)
WBC: 4 10*3/uL (ref 4.0–10.5)
WBC: 4.3 10*3/uL (ref 4.0–10.5)
WBC: 4.4 10*3/uL (ref 4.0–10.5)
WBC: 5.1 10*3/uL (ref 4.0–10.5)

## 2011-03-09 LAB — URINE CULTURE: Colony Count: 15000

## 2011-03-09 LAB — LIPID PANEL
Cholesterol: 139 mg/dL (ref 0–200)
HDL: 47 mg/dL (ref >39)
LDL Cholesterol: 84 mg/dL (ref 0–99)
Total CHOL/HDL Ratio: 3 RATIO
Triglycerides: 40 mg/dL (ref <150)
VLDL: 8 mg/dL (ref 0–40)

## 2011-03-09 LAB — BLOOD GAS, VENOUS
Acid-Base Excess: 4.5 mmol/L — ABNORMAL HIGH (ref 0.0–2.0)
Bicarbonate: 27.7 mEq/L — ABNORMAL HIGH (ref 20.0–24.0)
Drawn by: 290081
FIO2: 0.21 %
O2 Saturation: 54.7 %
Patient temperature: 98.6
TCO2: 24.5 mmol/L (ref 0–100)
pCO2, Ven: 37.2 mmHg — ABNORMAL LOW (ref 45.0–50.0)
pH, Ven: 7.485 — ABNORMAL HIGH (ref 7.250–7.300)
pO2, Ven: 26.2 mmHg — CL (ref 30.0–45.0)

## 2011-03-09 LAB — MAGNESIUM: Magnesium: 1.9 mg/dL (ref 1.5–2.5)

## 2011-03-09 LAB — CULTURE, BLOOD (ROUTINE X 2)
Culture: NO GROWTH
Culture: NO GROWTH

## 2011-03-09 LAB — HEMOCCULT GUIAC POC 1CARD (OFFICE): Fecal Occult Bld: NEGATIVE

## 2011-03-09 LAB — ANA: Anti Nuclear Antibody(ANA): NEGATIVE

## 2011-03-09 LAB — FOLATE RBC: RBC Folate: 963 ng/mL — ABNORMAL HIGH (ref 180–600)

## 2011-03-09 LAB — PREALBUMIN: Prealbumin: 22.3 mg/dL (ref 18.0–45.0)

## 2011-03-09 LAB — LACTIC ACID, PLASMA: Lactic Acid, Venous: 1.2 mmol/L (ref 0.5–2.2)

## 2011-03-09 LAB — CK: Total CK: 77 U/L (ref 7–177)

## 2011-03-09 LAB — T4, FREE: Free T4: 1.36 ng/dL (ref 0.80–1.80)

## 2011-03-09 LAB — HEMOGLOBIN A1C
Hgb A1c MFr Bld: 7.2 % — ABNORMAL HIGH (ref 4.6–6.1)
Mean Plasma Glucose: 160 mg/dL

## 2011-03-09 LAB — VITAMIN B12: Vitamin B-12: 681 pg/mL (ref 211–911)

## 2011-03-13 LAB — COMPREHENSIVE METABOLIC PANEL
ALT: 11 U/L (ref 0–35)
AST: 14 U/L (ref 0–37)
Albumin: 3.4 g/dL — ABNORMAL LOW (ref 3.5–5.2)
Alkaline Phosphatase: 39 U/L (ref 39–117)
BUN: 21 mg/dL (ref 6–23)
CO2: 28 mEq/L (ref 19–32)
Calcium: 8.7 mg/dL (ref 8.4–10.5)
Chloride: 106 mEq/L (ref 96–112)
Creatinine, Ser: 0.56 mg/dL (ref 0.4–1.2)
GFR calc Af Amer: >60 mL/min (ref >60)
GFR calc non Af Amer: >60 mL/min (ref >60)
Glucose, Bld: 114 mg/dL — ABNORMAL HIGH (ref 70–99)
Potassium: 3.7 mEq/L (ref 3.5–5.1)
Sodium: 138 mEq/L (ref 135–145)
Total Bilirubin: 0.7 mg/dL (ref 0.3–1.2)
Total Protein: 6.3 g/dL (ref 6.0–8.3)

## 2011-03-13 LAB — DIFFERENTIAL
Basophils Absolute: 0 10*3/uL (ref 0.0–0.1)
Basophils Relative: 0 % (ref 0–1)
Eosinophils Absolute: 0.1 10*3/uL (ref 0.0–0.7)
Eosinophils Relative: 1 % (ref 0–5)
Lymphocytes Relative: 25 % (ref 12–46)
Lymphs Abs: 1.5 10*3/uL (ref 0.7–4.0)
Monocytes Absolute: 0.4 10*3/uL (ref 0.1–1.0)
Monocytes Relative: 8 % (ref 3–12)
Neutro Abs: 3.8 10*3/uL (ref 1.7–7.7)
Neutrophils Relative %: 66 % (ref 43–77)

## 2011-03-13 LAB — URINALYSIS, ROUTINE W REFLEX MICROSCOPIC
Bilirubin Urine: NEGATIVE
Glucose, UA: NEGATIVE mg/dL
Hgb urine dipstick: NEGATIVE
Ketones, ur: NEGATIVE mg/dL
Nitrite: POSITIVE — AB
Protein, ur: NEGATIVE mg/dL
Specific Gravity, Urine: 1.012 (ref 1.005–1.030)
Urobilinogen, UA: 0.2 mg/dL (ref 0.0–1.0)
pH: 7 (ref 5.0–8.0)

## 2011-03-13 LAB — URINE CULTURE: Colony Count: 65000

## 2011-03-13 LAB — CBC
HCT: 38.9 % (ref 36.0–46.0)
Hemoglobin: 13.3 g/dL (ref 12.0–15.0)
MCHC: 34.1 g/dL (ref 30.0–36.0)
MCV: 91.9 fL (ref 78.0–100.0)
Platelets: 325 10*3/uL (ref 150–400)
RBC: 4.23 MIL/uL (ref 3.87–5.11)
RDW: 12.5 % (ref 11.5–15.5)
WBC: 5.8 10*3/uL (ref 4.0–10.5)

## 2011-03-13 LAB — URINE MICROSCOPIC-ADD ON

## 2011-03-13 LAB — CK TOTAL AND CKMB (NOT AT ARMC)
CK, MB: 0.5 ng/mL (ref 0.3–4.0)
Relative Index: INVALID (ref 0.0–2.5)
Total CK: 56 U/L (ref 7–177)

## 2011-03-13 LAB — TROPONIN I: Troponin I: 0.01 ng/mL (ref 0.00–0.06)

## 2011-03-13 LAB — GLUCOSE, CAPILLARY: Glucose-Capillary: 130 mg/dL — ABNORMAL HIGH (ref 70–99)

## 2011-03-13 LAB — TSH: TSH: 1.68 u[IU]/mL (ref 0.350–4.500)

## 2011-03-13 LAB — T4, FREE: Free T4: 1.2 ng/dL (ref 0.80–1.80)

## 2011-04-14 ENCOUNTER — Emergency Department (HOSPITAL_COMMUNITY)
Admission: EM | Admit: 2011-04-14 | Discharge: 2011-04-14 | Disposition: A | Discharge status: Home / Self Care | Payer: Self-pay | Attending: Emergency Medicine | Admitting: Emergency Medicine

## 2011-04-14 DIAGNOSIS — E119 Type 2 diabetes mellitus without complications: Secondary | ICD-10-CM | POA: Insufficient documentation

## 2011-04-14 DIAGNOSIS — N760 Acute vaginitis: Secondary | ICD-10-CM | POA: Insufficient documentation

## 2011-04-14 DIAGNOSIS — R0789 Other chest pain: Secondary | ICD-10-CM | POA: Insufficient documentation

## 2011-04-14 DIAGNOSIS — J309 Allergic rhinitis, unspecified: Secondary | ICD-10-CM | POA: Insufficient documentation

## 2011-04-14 DIAGNOSIS — Z794 Long term (current) use of insulin: Secondary | ICD-10-CM | POA: Insufficient documentation

## 2011-04-14 LAB — URINALYSIS, ROUTINE W REFLEX MICROSCOPIC
Bilirubin Urine: NEGATIVE
Glucose, UA: 100 mg/dL — AB
Hgb urine dipstick: NEGATIVE
Ketones, ur: NEGATIVE mg/dL
Nitrite: NEGATIVE
Protein, ur: NEGATIVE mg/dL
Specific Gravity, Urine: 1.013 (ref 1.005–1.030)
Urobilinogen, UA: 0.2 mg/dL (ref 0.0–1.0)
pH: 6 (ref 5.0–8.0)

## 2011-04-14 LAB — WET PREP, GENITAL
Clue Cells Wet Prep HPF POC: NONE SEEN
Trich, Wet Prep: NONE SEEN
Yeast Wet Prep HPF POC: NONE SEEN

## 2011-04-14 LAB — GLUCOSE, CAPILLARY: Glucose-Capillary: 136 mg/dL — ABNORMAL HIGH (ref 70–99)

## 2011-04-15 LAB — GC/CHLAMYDIA PROBE AMP, GENITAL
Chlamydia, DNA Probe: NEGATIVE
GC Probe Amp, Genital: NEGATIVE

## 2011-05-06 NOTE — Assessment & Plan Note (Signed)
Amanda [name]                                 ON-CALL NOTE   Amanda [name], Amanda [name]                      MRN:          [id number]  DATE:01/27/2007                            DOB:          [birth year]    Patient calling because she says her blood pressure is 65/60, and she  does not feel good.  She is not lightheaded.  Explained to patient that  a systolic blood pressure of 65, she would not be able to stand up at  all.  Patient is insistent that is what her blood pressure is.  Advised  to go to Urgent Care for evaluation.     Jeffrey A. Sherren Mocha, MD  Electronically Signed    JAT/MedQ  DD: 01/28/2007  DT: 01/28/2007  Job #: [id number]

## 2011-05-06 NOTE — Discharge Summary (Signed)
NAME:  Amanda [name], Amanda [name]                         ACCOUNT NO.:  [id number]   MEDICAL RECORD NO.:  [id number]                   PATIENT TYPE:  INP   LOCATION:  [id number]                                 FACILITY:  [facility]   PHYSICIAN:  Alden Hipp, M.D.                DATE OF BIRTH:  06-Apr-1951   DATE OF ADMISSION:  12/02/2003  DATE OF DISCHARGE:  12/04/2003                                 DISCHARGE SUMMARY   FINAL DIAGNOSES:  1. Myoma uteri.  2. Menorrhagia.  3. Abdominal pain.  4. Genuine urinary stress incontinence.   SECONDARY DIAGNOSES:  1. Adult-onset diabetes mellitus.  2. Hypercholesterolemia.   PROCEDURES:  Subtotal abdominal hysterectomy, bilateral salpingo-  oophorectomy, Burch urethral suspension.   COMPLICATIONS:  None.   CONDITION ON DISCHARGE:  Improved.   HISTORY:  This is a 60 year old primigravida with a 3-year history of  fibroids and a 2-year history of genuine urinary stress incontinence, the  patient also claims that her periods have been becoming significantly  heavier over the last several months.  At her most recent office visit she  was noted to have an abdominal mass which appeared to be separate from her  uterine fibroids and it was felt that based upon the enlargement of the  fibroids and development of a separate abdominal mass which might represent  an ovarian tumor that hysterectomy was indicated.  In addition the patient  complained of urinary stress incontinence and was felt that even this were a  benign mass that patient would benefit from the removal of the uterine  myomas and repair to increase her continence of urine.  The patient  understood the risks of the procedure.  A preoperative analysis showed an  MRI which seemed to indicate that this was indeed a fibroid and was  contiguous with the uterus and a CA 125 which was normal.  The patient was  taken to the operating room on the day of admission where a laparotomy was  performed which  revealed a 12-week enlarged uterus and what indeed was a  fibroid tumor but it was not attached to the uterus at all but apparently  arising from the round ligament.  Both tubes and ovaries appeared normal.  A  BSO was carried out along with a subtotal hysterectomy and removal of the  fibroid from the round ligament.  In addition a Burch urethral suspension  was performed.  The patient did very well postoperatively.  On the second  postoperative day she was passing gas, she was voiding well with no  significant urinary retention after the catheter was removed, she was  tolerating oral medications for pain and she was felt to be ready for  discharge.  She was discharged on a regular diet, told to limit her  activity, she was given Tylox 30 tablets to take one to two every 4 hours  for pain, she was given Climara patch  0.1 mg to be changed weekly and she  was asked to return to the office in 3 weeks for followup evaluation.   LABORATORY DATA:  Her pathology report showed a uterus that weighed 505 g,  the cervix had been left in situ.  There were noted in the uterus a  subserosal, intramural and submucosal fibroid which varied in size from 1 to  6.5 cm.  The ovaries and tubes were normal.  The round ligament fibroid was  422 g with no atypia on evaluation.   OTHER LABORATORY DATA:  EKG which was normal sinus rhythm, cannot rule out  anterior infarct age undetermined, low R in V3 may just be lead placement,  no old tracings to compare, this was read by Levin Erp on November 27, 2003.  Her laboratory data revealed an admission hemoglobin of 13.6 with a  white count of 6200, platelet count was 443,000.  Postoperatively her white  count was 10.7, hemoglobin 11.6, with a platelet count of 383,000.  Her  routine chemistry labs were normal except for a slightly elevated random  glucose of 128.  Urinalysis was benign.                                               Alden Hipp, M.D.     RK/MEDQ  D:  12/28/2003  T:  12/28/2003  Job:  [id number]   cc:   Frann Rider, M.D.  [place]  [place] [address]  Fax: [phone]

## 2011-05-06 NOTE — H&P (Signed)
NAME:  Amanda [name], Amanda [name]                         ACCOUNT NO.:  [id number]   MEDICAL RECORD NO.:  [id number]                   PATIENT TYPE:  INP   LOCATION:  NA                                   FACILITY:  [facility]   PHYSICIAN:  Alden Hipp, M.D.                DATE OF BIRTH:  [birth year]   DATE OF ADMISSION:  DATE OF DISCHARGE:                                HISTORY & PHYSICAL   DATE OF SCHEDULED ADMISSION:  December 02, 2003.   CHIEF COMPLAINT:  Myoma uteri and urinary stress incontinence.   HISTORY OF PRESENT ILLNESS:  This is a 60 year old gravida 1, para 1 who has  a three-year history of fibroids and a two-year history of genuine urinary  stress incontinence.  The patient has been followed in the office for these  problems. The uterus was approximately 12 weeks size and her urine loss was  not significant.  She was encouraged to do Kegel exercises and she was  examined yearly to follow her fibroids.  In 2003 her fibroids had increased  in size to 14-16 weeks size.  At that time she also complained of some lower  abdominal pain.  When she presented to the office in November 2004 she  stated that her urinary stress incontinence was getting worse.  In addition,  on exam a mass was noted in the left lower quadrant, which turned out to be  an 11 cm pedunculated myoma on MRI.  With the presence of enlarging  fibroids, abdominal pain and urinary stress incontinence the decision was  made to proceed with abdominal hysterectomy and a Burch urethral suspension.   ALLERGIES:  The patient's past medical history is negative for drug  allergies.   MEDICATIONS:  Medications Glucovance, Lipitor and Actos.   PAST MEDICAL HISTORY:  Medical problems include:  1. Adult onset diabetes mellitus controlled with oral medications.  2. Hypercholesterolemia.   PAST SURGICAL HISTORY:  Previous surgeries include breast biopsy and  laparoscopic tubal ligation.   SOCIAL HISTORY:  The patient does not  smoke or use alcohol, or use street  drugs.  She has been married for 31 years; and, is unemployed at the present  time.   FAMILY HISTORY:  Family history reveals that her mother, sister and aunts  all have high blood pressure, hypercholesterolemia and diabetes.  There is  no breast, ovarian , uterine or colon cancer in the family, and no heart  disease.   PHYSICAL EXAMINATION:  VITAL SIGNS:  The patient is 5 feet 2 inches tall,  191 pounds.  Afebrile.  Blood pressure is 130/80, regular sinus rhythm.  NECK:  The patient's neck is supple without thyromegaly.  HEART:  Heart rate is regular sinus rhythm without murmurs or gallops.  LUNGS:  The patient's lungs are clear to auscultation and percussion.  ABDOMEN:  The patient's abdomen shows a mass in the left periumbilical area.  PELVIC EXAMINATION:  On pelvic exam her uterus is enlarged. 14-16 weeks, and  the mass is well out of the pelvis.  GENITALIA:  The patient's external genitalia is normal.  Her urethra shows  some mild descent of the ureterovesical junction, but is otherwise normal.  Cervix is normal.  Uterus is 16 weeks size.   LABORATORY DATA:  Pap smear is normal.  Urinalysis was negative.  Ultrasound  revealed a 10 cm mass and MRI confirmed the presence of a mass, and appeared  to confirm that the mass was contiguous with the uterus.   IMPRESSION:  This represents a pedunculated myoma and not an ovarian mass;  however, the fibroids are symptomatic in that they give her abdominal pain.  In addition, the patient has the additional problem of urinary stress  incontinence.   The decision was made after a long discussion with the patient, discussing  the risks and benefits of surgery, to proceed with abdominal hysterectomy  and Burch urethral suspension.                                               Alden Hipp, M.D.    RK/MEDQ  D:  12/01/2003  T:  12/02/2003  Job:  [id number]

## 2011-05-06 NOTE — H&P (Signed)
NAMEMALVINE, [name]               ACCOUNT NO.:  [id number]   MEDICAL RECORD NO.:  [id number]          PATIENT TYPE:  OBV   LOCATION:  0101                         FACILITY:  [facility]   PHYSICIAN:  Sandy Salaam. Shawna Orleans, DO      DATE OF BIRTH:  1951-10-09   DATE OF ADMISSION:  01/27/2007  DATE OF DISCHARGE:                              HISTORY & PHYSICAL   PRIMARY CARE PHYSICIAN:  Dr. Renato Shin.   CHIEF COMPLAINT:  Dizziness/weakness.   HISTORY OF PRESENT ILLNESS:  The patient is a 60 year old African-  American female with a past medical history of hypertension,  hyperlipidemia and type 2 diabetes, who presents with dizziness and  weakness.  Her symptoms have been worsening over the last 10 days. She  had called her primary physician and was scheduled to see him for this  issue on February 10th. The patient states she woke up this morning and  felt very light headed and weak. She had been started on a new blood  pressure medication approximately 2 months ago.  She does not recall the  name of the medication, but Lisinopril/hydrochlorothiazide is listed per  nursing staff.   She has also been taking periodic pain medications, Ultram for abdominal  pain. She states she has not taken any Ultram for the last one week in  time.  She denies any associated symptoms such as chest pain, headache. She  does note mild shortness of breath with standing and mild blurry vision.  She denies any recent preceding illness.   REVIEW OF SYSTEMS:  As noted above. She also notes a mild sore throat.   PAST MEDICAL HISTORY:  1. Type 2 diabetes.  2. Hypertension.  3. Hyperlipidemia.  4. Gastroesophageal reflux disease.  5. She denies any history of coronary artery disease or stroke.   SOCIAL HISTORY:  The patient is married and has one daughter. Lives with  her husband. She works as a Academic librarian.   HABITS:  No alcohol or tobacco. Denies recreational drug use.   FAMILY HISTORY:  Mother is alive  at age 41, has type 2 diabetes and  hypertension, father deceased at age 56 secondary to brain aneurysm.  Multiple siblings with diabetes and hypertension. Brother is noted to  have lung cancer at age 72.   PHYSICAL EXAMINATION:  VITAL SIGNS: Temperature is 97.6, blood pressure  is 90/51, pulse is 135 with standing, respirations 20 and O2 is 99% on  room air.  IN GENERAL: The patient is a pleasant, well-developed, well-nourished 60-  year-old African-American female in no apparent distress. Alert, awake,  oriented x3.  HEENT: Normocephalic, atraumatic. Pupils are equal and reactive to light  bilaterally. Extraocular motility was intact. Anicteric. Conjunctivae  within normal limits.  NECK:  Supple, no adenopathy, no carotid bruit or thyromegaly.  CHEST EXAM: Normal inspiratory effort. Chest was clear to auscultation  bilaterally, no rhonchi, rales or wheezing.  CARDIOVASCULAR: Regular rate and rhythm, no significant murmurs, rubs,  or gallops appreciated.  ABDOMEN: Soft, nontender, positive bowel sounds, no organomegaly.  MUSCULOSKELETAL EXAM: No clubbing, cyanosis or edema.  NEUROLOGIC:  Cranial nerves II-XII grossly intact, she was nonfocal.   LABORATORY DATA:  CBC showed WBC 7,000, H&H of 15.3/43.2, platelet count  of 440,000. Basic metabolic profile notable for potassium of 3.8, BUN  28, creatinine of 0.88, blood sugar was 142. First set of troponin's was  negative at 0.05. EKG showed normal sinus rhythm at 90 beats per minute,  no acute ST, T changes noted.   IMPRESSION/RECOMMENDATIONS:  1. Hypotension with dizziness.  2. Type 2 diabetes.  3. Hyperlipidemia.  4. Gastroesophageal reflux disease.   RECOMMENDATIONS:  I suspect patient's hypotension is likely due to  recent blood pressure medication change. However will admit the patient  for observation, cycle cardiac enzymes and check a D-dimer.  I doubt  that her hypertension is from pulmonary embolism but if positive we  will  order a CT of chest with PE protocol.   We will provide IV hydration. If blood pressure is normal and patient is  asymptomatic, we will consider discharge.      Sandy Salaam. Shawna Orleans, DO  Electronically Signed     RDY/MEDQ  D:  01/27/2007  T:  01/27/2007  Job:  [id number]   cc:   Hilliard Clark A. Loanne Drilling, [place]  [place] [address]

## 2011-05-06 NOTE — Op Note (Signed)
NAME:  Amanda [name], Amanda [name]                         ACCOUNT NO.:  [id number]   MEDICAL RECORD NO.:  [id number]                   PATIENT TYPE:  INP   LOCATION:  0007                                 FACILITY:  [facility]   PHYSICIAN:  Alden Hipp, M.D.                DATE OF BIRTH:  04-10-1951   DATE OF PROCEDURE:  12/02/2003  DATE OF DISCHARGE:                                 OPERATIVE REPORT   PREOPERATIVE DIAGNOSES:  1. Myoma uteri.  2. Pedunculated left myoma.  3. Urinary stress incontinence.   POSTOPERATIVE DIAGNOSES:  1. Myoma uteri.  2. Large myoma, left round ligament.  3. Urinary stress incontinence.   OPERATION/PROCEDURE:  1. Removal of left round ligament myoma.  2. Supracervical hysterectomy.  3. Bilateral salpingo-oophorectomy.  4. Burch urethropexy.   SURGEON:  Alden Hipp, M.D.   ASSISTANT:  Barbaraann Rondo, M.D.   ANESTHESIA:  General endotracheal anesthesia.   ESTIMATED BLOOD LOSS:  200 mL.   FINDINGS:  There was an 11 cm myoma rising from the left round ligament.  The tubes and ovaries were normal.  The uterus was myomatous, approximately  14-16 weeks size.  Appendix appeared normal.  The kidneys were palpable  bilaterally and the liver edge was smooth.   INDICATIONS:  This is a 60 year old, gravida 1, para 1, who has been  followed in the office with known fibroids which were essentially  asymptomatic except for occasional episodes of lower abdominal pain.  On her  most recent pelvic exam, a large mass was noted in the left adnexal area  which could not be defined on pelvic exam as being attached to the uterus.  Because of the appearance of this large mass and the question about whether  it was myoma or an ovarian mass, incision was made to proceed with surgery.  In addition, the patient had a 2-3-year history of increasingly severe  genuine urinary stress incontinence and incision was made to correct this  problem at the time of surgery.   DESCRIPTION  OF PROCEDURE:  The patient was taken to the operating room and  placed in the supine position.  General endotracheal anesthesia was induced.  The patient was placed in the low Trendelenburg position.  The bladder was  catheterized with a three-way catheter with a 10 mL Foley bulb.  A low  vertical incision was made and carried down to the fascia.  It was extended  vertically.  The rectus muscle was divided in the midline, the peritoneum  was then entered sharply and extended vertically.  The Balfour retractor was  placed and the pelvis was explored and the kidneys were palpable  bilaterally.  The liver edge was smooth.  The tubes and ovaries were normal.  There was a large fibroid in the right lower abdomen which was attached onto  the round ligament and not to the fundus of the uterus.  This fibroid had  adhesions to the surrounding peritoneum.  The fibroid was then dissected  free by incising the serosal and peritoneum covering and carefully  dissecting out the surrounding peritoneum so that the fibroid was free.  The  stalk was clamped and the round ligament was clamped and these were suture  ligated.  At this point the fibroid was totally removed.   Attention was turned to the fundus which was grasped with Heaney clamps at  the cornual areas and uterus was delivered through the incision.  The round  ligament on the right side was cut and ligated. The  bladder plane was  created on the right side.  The infundibulopelvic ligament was then  isolated, clamped and doubly ligated.  On the left side, the round ligament  had previously been ligated with the removal of the fibroid.  The serosa of  the bladder flap on the anterior surface of the uterus was dissected free so  the bladder was displaced superiorly.  The infundibulopelvic ligament on the  left side was isolated, clamped and doubly ligated.  The uterine arteries  were skeletonized, clamped, cut, and ligated.  The cardinal ligaments  were  then clamped, cut and ligated.  At this point the fundus was removed.  The  endocervical canal was cauterized and the cervical stump was closed with  interrupted figure-of-eight sutures.  The main reason for leaving the cervix  was a discussion that occurred prior to surgery with the patient and her  husband who wanted to do the minimum amount of surgery necessary to remove  the fibroid and the ovaries and did not want to extend the surgery in any  way that might increase the risks of bladder injury.   At this point the surgeon placed a double glove on the left hand and placed  the left hand in the vagina.  The space of Retzius was dissected.  The  vagina lateral to the urethra was elevated with the finger and a figure-of-  eight suture was placed with an 0 Prolene suture.  The Prolene was then  placed through the Cooper's ligament.  A second stitch on the ipsilateral  side was made so that there were two stitches on the right and two stitches  on the left wall, placed in similar fashion.  The bladder was then filled  with indigo carmine dye and no dye was seen escaping around the suture site  in the vagina, indicating the bladder had not been entered.   At this point the bladder was drained and the sutures were tied, elevating  the urethrovesical junction towards Cooper's ligament.  Surgeon then  regloved.  The peritoneum and fascia were closed with a Smead-Jones type  running suture using PDS suture.  The subcutaneous tissue was closed with  interrupted 2-0 plain suture and the skin was closed with staples.  The  patient tolerated the procedure well and left the operating room in good  condition.                                               Alden Hipp, M.D.    RK/MEDQ  D:  12/02/2003  T:  12/02/2003  Job:  [id number]

## 2011-05-06 NOTE — Group Therapy Note (Signed)
NAME:  Amanda [name], Amanda [name] NO.:  [id number]   MEDICAL RECORD NO.:  [id number]          PATIENT TYPE:  WOC   LOCATION:  [facility]                   FACILITY:  [facility]   PHYSICIAN:  Nicoletta Dress, MD     DATE OF BIRTH:  [birth year]   DATE OF SERVICE:  03/15/2007                                  CLINIC NOTE   HISTORY:  A 60 year old black female presents for a first visit in the  clinic.  In 2003 she had a supracervical abdominal hysterectomy with  removal of tubes and ovaries.  This was done by Dr. Nori Riis.  This was  done, according to the patient, for fibroids.  She resisted being on  estrogen replacement because of what people were saying.  However, in  February of this year which is the last time that she saw Dr. Nori Riis, she  was started on Premarin 0.45 mg a day.  The symptomatic hot flashes that  she was having before this past February have now totally and completely  gone away.   She presents, today, with a history of a problem of sleeping, some lower  abdominal occasional pain, and occasional vaginal dryness.   She is on medications which are listed on the medication list.  She is  diabetic.  She was taking Protonix, but has recently stopped taking  that.   FAMILY HISTORY:  Breast cancer is negative, colon cancer a grandmother  on the mother's side, and ovarian cancer is negative.  The patient last  had a mammogram 1-2 years ago.   REVIEW OF SYSTEMS:  Pretty much unremarkable.  She has no genitourinary  or GI complaints.   PHYSICAL EXAMINATION:  VITAL SIGNS:  Height 5 feet 1/2 inch.  Weight 130  pounds.  Blood pressure 133/86.  NECK:  Thyroid is negative.  BREASTS:  Bilaterally normal.  No masses or tenderness are noted.  ABDOMEN:  Flat and soft and nontender.  There is no guarding.  PELVIC EXAM:  External genitalia and BUS glands were normal.  Vaginal  vault had pretty good rugae.  There was a white vaginal discharge.  Vaginal cuff was well healed.  Cervix is  seen and is normal.  Bimanual  examination is completely normal.  There are no pelvic masses or  tenderness noted.   IMPRESSION:  Normal GYN exam, menopausal symptoms.   DISPOSITION:  1. She is given a prescription for Premarin 0.45/5 mg tablets #60 with      6 refills.  2. She will schedule a mammogram and is going to make out the      scholarship papers.  3. I have asked her to avoid caffeine, such as tea, coffee, colas, or      chocolate within four hours of bedtime.  4. I have asked her to change her fabric softener to the wash cycle      rather than a dryer sheets.  5. I have asked her to refrain from using bubble baths.  6. I have asked her to refrain from using any type of lubricant except      as might be needed with sexual  intercourse.           ______________________________  Nicoletta Dress, MD     ER/MEDQ  D:  03/15/2007  T:  03/15/2007  Job:  [id number]

## 2011-05-06 NOTE — Discharge Summary (Signed)
Amanda [name], Amanda [name]               ACCOUNT NO.:  [id number]   MEDICAL RECORD NO.:  [id number]          PATIENT TYPE:  OBV   LOCATION:  37                         FACILITY:  [facility]   PHYSICIAN:  Valerie A. Asa Lente, MDDATE OF BIRTH:  14-Oct-1951   DATE OF ADMISSION:  01/27/2007  DATE OF DISCHARGE:  01/28/2007                               DISCHARGE SUMMARY   DISCHARGE SUMMARY/OBSERVATION   DISCHARGE DIAGNOSIS:  1. Weakness with hypotension, likely secondary to over diuresis,      improved status post fluid, and discontinuation of      antihypertensives diuretic combination see details below.  2. Type 2 diabetes.  3. History of hypertension.  4. Dyslipidemia.  5. Dyspepsia with nonspecific abdominal pain.   DISCHARGE MEDICATIONS INCLUDE:  1. Discontinuation of lisinopril/HCTZ.  Other medications are as prior to admission and include:  1. Metformin 1 gram b.i.d.  2. Glyburide 10 mg b.i.d.  3. Pravastatin 40 mg q.h.s.  4. Multivitamin 1 daily.  5. Aspirin 81 mg daily.  6. Ultram 1-2 tablets q.4-6 h. p.r.n. pain.  7. Protonix 40 mg daily.  8. Premarin 0.45 mg p.o. daily (note:  The patient began this recently      at recommendation of gynecologist for post menopausal symptoms).   DISPOSITION:  The patient is discharged home in medically stable and  improved condition.  Her hypotension has improved with systolic pressure  ranging 110-114 since admission, status post IV fluids.  She is no  longer having symptomatic dizziness or weakness, though still with  nonspecific abdominal pain.  Hospital followup is as previously  scheduled for tomorrow, Monday, February 11 with primary care physician  Dr. Renato Shin to review these medications, and question further need  for workup of abdominal pain and dyspepsia.   HOSPITAL COURSE BY PROBLEM:  Problem #1:  HYPOTENSION WITH DIZZINESS.  The patient was a 60 year old pleasant African-American woman with  diabetes, dyslipidemia, and a  history of hypertension.  She was  reportedly recently begun on lisinopril/HCTZ for hypertension and  presumed renal protection who since that time has been feeling weak and  dizzy.  She also has been started on Protonix as well as Ultram for  dyspepsia with abdominal pain, and even more recently Premarin for hot  flash symptoms by her gynecologist.  The patient reports that since the  time in addition to these new medications, she has felt progressively  dizzy and weak; and came to the emergency room for further evaluation,  due to these concerns.  She was found to have a blood pressure of 90/50  and a heart rate of 135 with labs otherwise unremarkable.  She was thus  felt to be dehydrated likely from diuretic with possible exacerbation of  hypotension by her ACE inhibitor.  She was thus referred for  observation/admission for IV hydration and holding this medication.  Cardiac enzymes were cycled which were negative.  Telemetry was  monitored which was unremarkable; and a D-dimer was checked which was  undetectable at less than is 0.22.  With overnight hydration and holding  her lisinopril/HCTZ, the patient's symptoms have  resolved.  She still  had concerns regarding her abdominal pain and dyspepsia for which she is  taking Ultram and Protonix, but wonders if there are further underlying  problems.  She also reports that since starting her Premarin, her hot  flash and gynecologic symptoms have greatly improved; and is hesitant to  stop this medication, at this time.  I have encouraged the patient only  to discontinue her lisinopril/HCTZ at this time, maintaining close  observation of her fluid and hydration status; and otherwise maintain  hospital followup with primary care physician tomorrow February 11 at  10:00 a.m. as previously scheduled with Dr. Loanne Drilling to discuss these  other issues, including questionable further GI evaluation needs, and  further discussion on Premarin.  The  patient is felt stable otherwise  for discharge home with no further complaints.      Valerie A. Asa Lente, MD  Electronically Signed     VAL/MEDQ  D:  01/28/2007  T:  01/28/2007  Job:  [id number]

## 2011-12-01 ENCOUNTER — Encounter: Payer: Self-pay | Admitting: Emergency Medicine

## 2011-12-01 ENCOUNTER — Emergency Department (INDEPENDENT_AMBULATORY_CARE_PROVIDER_SITE_OTHER)
Admission: EM | Admit: 2011-12-01 | Discharge: 2011-12-01 | Disposition: A | Discharge status: Home / Self Care | Payer: Self-pay | Source: Home / Self Care | Attending: Family Medicine | Admitting: Family Medicine

## 2011-12-01 DIAGNOSIS — N390 Urinary tract infection, site not specified: Secondary | ICD-10-CM

## 2011-12-01 DIAGNOSIS — J310 Chronic rhinitis: Secondary | ICD-10-CM

## 2011-12-01 LAB — POCT URINALYSIS DIP (DEVICE)
Bilirubin Urine: NEGATIVE
Glucose, UA: NEGATIVE mg/dL
Nitrite: POSITIVE — AB
Protein, ur: 100 mg/dL — AB
Specific Gravity, Urine: 1.01 (ref 1.005–1.030)
Urobilinogen, UA: 0.2 mg/dL (ref 0.0–1.0)
pH: 6.5 (ref 5.0–8.0)

## 2011-12-01 MED ORDER — CEPHALEXIN 500 MG PO CAPS: 500.0000 mg | ORAL_CAPSULE | Freq: Two times a day (BID) | ORAL | Status: AC

## 2011-12-01 MED ORDER — FLUCONAZOLE 150 MG PO TABS: 150.0000 mg | ORAL_TABLET | Freq: Once | ORAL | Status: AC

## 2011-12-01 NOTE — ED Provider Notes (Addendum)
History     CSN: [id number] Arrival date & time: 12/01/2011  3:24 PM   First MD Initiated Contact with Patient 12/01/11 1453      Chief Complaint  Patient presents with  . Urinary Tract Infection  . Sinusitis    (Consider location/radiation/quality/duration/timing/severity/associated sxs/prior treatment) HPI Comments: Amanda [name] presents for evaluation of burning with urination, urinary frequency, urinary urgency. She also reports sinus pressure and head congestion. Her symptoms have been present over the last few days. She denies any fever.   Patient is a 60 y.o. female presenting with urinary tract infection and sinusitis. The history is provided by the patient.  Urinary Tract Infection This is a new problem. The current episode started more than 2 days ago. The problem has not changed since onset.The symptoms are aggravated by nothing. The symptoms are relieved by nothing.  Sinusitis     Past Medical History  Diagnosis Date  . Diabetes mellitus   . Hypotension     No past surgical history on file.  No family history on file.  History  Substance Use Topics  . Smoking status: Not on file  . Smokeless tobacco: Not on file  . Alcohol Use:     OB History    Grav Para Term Preterm Abortions TAB SAB Ect Mult Living                  Review of Systems  Constitutional: Negative.   HENT: Negative.   Eyes: Negative.   Respiratory: Negative.   Cardiovascular: Negative.   Gastrointestinal: Negative.   Genitourinary: Positive for dysuria, urgency, frequency, hematuria, flank pain and difficulty urinating. Negative for vaginal bleeding and vaginal discharge.  Musculoskeletal: Negative.   Skin: Negative.   Neurological: Negative.     Allergies  Review of patient's allergies indicates not on file.  Home Medications   Current Outpatient Rx  Name Route Sig Dispense Refill  . INSULIN ASPART 100 UNIT/ML [place] SOLN Subcutaneous Inject into the skin 3 (three) times daily  before meals. SLIDING SCALE ONLY  WITH MEALS       BP 154/87  Pulse 104  Temp(Src) 98.7 F (37.1 C) (Oral)  Resp 18  SpO2 100%  Physical Exam  Nursing note and vitals reviewed. Constitutional: She is oriented to person, place, and time. She appears well-developed and well-nourished.  HENT:  Head: Normocephalic and atraumatic.  Right Ear: Tympanic membrane is retracted.  Left Ear: Tympanic membrane is retracted.  Mouth/Throat: Uvula is midline, oropharynx is clear and moist and mucous membranes are normal.  Eyes: EOM are normal.  Neck: Normal range of motion.  Pulmonary/Chest: Effort normal and breath sounds normal.  Abdominal: Soft. Bowel sounds are normal. There is no tenderness.  Musculoskeletal: Normal range of motion.  Neurological: She is alert and oriented to person, place, and time.  Skin: Skin is warm and dry.  Psychiatric: Her behavior is normal.    ED Course  Procedures (including critical care time)  Labs Reviewed  POCT URINALYSIS DIP (DEVICE) - Abnormal; Notable for the following:    Ketones, ur TRACE (*)    Hgb urine dipstick LARGE (*)    Protein, ur 100 (*)    Nitrite POSITIVE (*)    Leukocytes, UA LARGE (*) Biochemical Testing Only. Please order routine urinalysis from main lab if confirmatory testing is needed.   All other components within normal limits  URINALYSIS, DIPSTICK ONLY   No results found.   No diagnosis found.    MDM  Marcell Anger, MD 12/01/11 [place], MD 12/01/11 [phone]

## 2011-12-01 NOTE — ED Notes (Signed)
PT HERE WITH POSS SINUS INFECTION AND SX UTI PRESURE AFTER STREAM,BURNING AND CLOUDY URINE THAT STARTED 2 DYS AGO.PT ALSO NOTICED SMALL HEMATURIA THIS AM.DENIES ABD PAIN OR N/V/EVER BUT SOME LOW BACK DISCOMFORT.SX SINUS PRESSURE,SNEEZING AND DRY COUGH.

## 2012-06-19 ENCOUNTER — Emergency Department (INDEPENDENT_AMBULATORY_CARE_PROVIDER_SITE_OTHER): Payer: Self-pay

## 2012-06-19 ENCOUNTER — Encounter (HOSPITAL_COMMUNITY): Payer: Self-pay | Admitting: *Deleted

## 2012-06-19 ENCOUNTER — Emergency Department (INDEPENDENT_AMBULATORY_CARE_PROVIDER_SITE_OTHER)
Admission: EM | Admit: 2012-06-19 | Discharge: 2012-06-19 | Disposition: A | Discharge status: Home / Self Care | Payer: Self-pay | Source: Home / Self Care

## 2012-06-19 DIAGNOSIS — M25539 Pain in unspecified wrist: Secondary | ICD-10-CM

## 2012-06-19 DIAGNOSIS — M25531 Pain in right wrist: Secondary | ICD-10-CM

## 2012-06-19 DIAGNOSIS — H00014 Hordeolum externum left upper eyelid: Secondary | ICD-10-CM

## 2012-06-19 DIAGNOSIS — H00019 Hordeolum externum unspecified eye, unspecified eyelid: Secondary | ICD-10-CM

## 2012-06-19 MED ORDER — ERYTHROMYCIN 2 % EX OINT: TOPICAL_OINTMENT | CUTANEOUS | Status: DC

## 2012-06-19 MED ORDER — IBUPROFEN 800 MG PO TABS: 800.0000 mg | ORAL_TABLET | Freq: Three times a day (TID) | ORAL | Status: AC

## 2012-06-19 NOTE — ED Provider Notes (Signed)
History     CSN: [id number]  Arrival date & time 06/19/12  1153   None     Chief Complaint  Patient presents with  . Eye Problem    (Consider location/radiation/quality/duration/timing/severity/associated sxs/prior treatment) Patient is a 61 y.o. female presenting with eye problem. The history is provided by the patient.  Eye Problem   #1.  patient reports left eyelid pain and "knot" for four days preceded by redness for three days.  [place] the [place] when she noticed redness.  States has applied warm compresses and used eyewash with no change in condition.  Denies previous history of same.  Diabetic, no previous eye surgery/trauma, denies cataracts or glaucoma. No floaters or flashing lights No vision loss No blurred vision No eye pain +eyelid itching No tearing No headache/scalp tenderness  #2.  SUBJECTIVE: Amanda [name] is a 61 y.o. female who complains of right wrist pain for one month.  States two weeks ago noticed swelling and tingling, pain radiates up elbow, worse with movement and lifting.  No known injury, has taken tylenol with partial relief.  Denies previous problems with wrist, no musculoskelatal or neurological disorders.    #3.  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  States unable to return to work until evaluated for same.  Denies rash, itching or symptoms.    Past Medical History  Diagnosis Date  . Diabetes mellitus   . Hypotension     History reviewed. No pertinent past surgical history.  No family history on file.  History  Substance Use Topics  . Smoking status: Not on file  . Smokeless tobacco: Not on file  . Alcohol Use:     OB History    Grav Para Term Preterm Abortions TAB SAB Ect Mult Living                  Review of Systems  All other systems reviewed and are negative.    Allergies  Review of patient's allergies indicates not on file.  Home  Medications   Current Outpatient Rx  Name Route Sig Dispense Refill  . ERYTHROMYCIN 2 % EX OINT  Apply to affected area 2 times daily 25 g 0  . IBUPROFEN 800 MG PO TABS Oral Take 1 tablet (800 mg total) by mouth 3 (three) times daily. 21 tablet 0  . INSULIN ASPART 100 UNIT/ML [place] SOLN Subcutaneous Inject into the skin 3 (three) times daily before meals. SLIDING SCALE ONLY  WITH MEALS       BP 175/80  Pulse 94  Temp 98.2 F (36.8 C) (Oral)  Resp 18  SpO2 100%  Physical Exam  Nursing note and vitals reviewed. Constitutional: She is oriented to person, place, and time. Vital signs are normal. She appears well-developed and well-nourished. She is active and cooperative.  HENT:  Head: Normocephalic.  Right Ear: Hearing, tympanic membrane, external ear and ear canal normal.  Left Ear: Hearing, tympanic membrane, external ear and ear canal normal.  Nose: Nose normal.  Mouth/Throat: Uvula is midline, oropharynx is clear and moist and mucous membranes are normal.  Eyes: Conjunctivae and EOM are normal. Pupils are equal, round, and reactive to light. Right eye exhibits no discharge, no exudate and no hordeolum. Left eye exhibits hordeolum. Left eye exhibits no discharge and no exudate. No scleral icterus.    Neck: Trachea normal and normal range of motion. Neck supple.  Cardiovascular: Normal rate, regular  rhythm, normal heart sounds, intact distal pulses and normal pulses.   Pulmonary/Chest: Effort normal and breath sounds normal.  Musculoskeletal:       Right shoulder: Normal.       Right elbow: Normal.      Right wrist: She exhibits tenderness and swelling. She exhibits no crepitus and no deformity.       Right upper arm: Normal.       Right forearm: She exhibits swelling.  Neurological: She is alert and oriented to person, place, and time. No cranial nerve deficit or sensory deficit. GCS eye subscore is 4. GCS verbal subscore is 5. GCS motor subscore is 6.  Skin: Skin is warm, dry and  intact. No rash noted.  Psychiatric: She has a normal mood and affect. Her speech is normal and behavior is normal. Judgment and thought content normal. Cognition and memory are normal.    ED Course  Procedures (including critical care time)  Labs Reviewed - No data to display Dg Wrist Complete Right  06/19/2012  *RADIOLOGY REPORT*  Clinical Data: Right wrist pain, no known injury  RIGHT WRIST - COMPLETE 3+ VIEW  Comparison: None.  Findings: Four views of the right wrist submitted.  No acute fracture or subluxation.  No radiopaque foreign body.  IMPRESSION: No acute fracture or subluxation.  Original Report Authenticated By: Lahoma Crocker, M.D.     1. Hordeolum externum of left upper eyelid   2. Right wrist pain       MDM  Antibiotic ointment as ordered to left eye.  Take ibuprofen daily for seven days for right wrist pain, ace for comfort.  No rash/no symptoms, I do not believe you were exposed to scabies.        Awilda Metro, NP 06/19/12 2155

## 2012-06-19 NOTE — ED Notes (Signed)
Pt  Has   3    Issues  She  Wants  Too  Be  Checked  For  Scabies    She  Reports  Exposed  To  It  -  She  Has  No    Symptoms -     She  Also  Has     What  Appears  To  Be    Something  Like  A  Stye  On l  Upper  Eyelid            -  She  Also  Reports      Some  Swelling to r    Wrist  Area  For  Quite  A  While  She  denys  Any  Injury

## 2012-06-20 ENCOUNTER — Telehealth (HOSPITAL_COMMUNITY): Payer: Self-pay | Admitting: *Deleted

## 2012-06-20 NOTE — ED Provider Notes (Signed)
Medical screening examination/treatment/procedure(s) were performed by non-physician practitioner and as supervising physician I was immediately available for consultation/collaboration.  Philipp Deputy, M.D.   Harden Mo, MD 06/20/12 1053

## 2012-06-20 NOTE — ED Notes (Signed)
[facility] on [address] called and said the Rx. for Erythromycin 2%, is a gel for the face. She asked if we meant the E-mycin opthalmic ointment that is 0.5 %.  It is for a stye. Discussed with Dr. Milinda Antis and she said yes it should be the opthalmic ointment 0.5% for the eye.  Pharmacy tech notified that Dr. Milinda Antis changed it as noted above. Roselyn Meier 06/20/2012

## 2012-12-30 ENCOUNTER — Encounter (HOSPITAL_COMMUNITY): Payer: Self-pay | Admitting: Emergency Medicine

## 2012-12-30 ENCOUNTER — Emergency Department (HOSPITAL_COMMUNITY)
Admission: EM | Admit: 2012-12-30 | Discharge: 2012-12-30 | Disposition: A | Discharge status: Home / Self Care | Payer: Self-pay | Attending: Emergency Medicine | Admitting: Emergency Medicine

## 2012-12-30 ENCOUNTER — Emergency Department (HOSPITAL_COMMUNITY): Payer: Self-pay

## 2012-12-30 DIAGNOSIS — R05 Cough: Secondary | ICD-10-CM | POA: Insufficient documentation

## 2012-12-30 DIAGNOSIS — Z79899 Other long term (current) drug therapy: Secondary | ICD-10-CM | POA: Insufficient documentation

## 2012-12-30 DIAGNOSIS — R0789 Other chest pain: Secondary | ICD-10-CM | POA: Insufficient documentation

## 2012-12-30 DIAGNOSIS — R059 Cough, unspecified: Secondary | ICD-10-CM | POA: Insufficient documentation

## 2012-12-30 DIAGNOSIS — R739 Hyperglycemia, unspecified: Secondary | ICD-10-CM

## 2012-12-30 DIAGNOSIS — Z794 Long term (current) use of insulin: Secondary | ICD-10-CM | POA: Insufficient documentation

## 2012-12-30 DIAGNOSIS — R7309 Other abnormal glucose: Secondary | ICD-10-CM | POA: Insufficient documentation

## 2012-12-30 DIAGNOSIS — I959 Hypotension, unspecified: Secondary | ICD-10-CM | POA: Insufficient documentation

## 2012-12-30 DIAGNOSIS — E860 Dehydration: Secondary | ICD-10-CM | POA: Insufficient documentation

## 2012-12-30 DIAGNOSIS — E119 Type 2 diabetes mellitus without complications: Secondary | ICD-10-CM | POA: Insufficient documentation

## 2012-12-30 DIAGNOSIS — R112 Nausea with vomiting, unspecified: Secondary | ICD-10-CM | POA: Insufficient documentation

## 2012-12-30 LAB — COMPREHENSIVE METABOLIC PANEL
ALT: 15 U/L (ref 0–35)
AST: 22 U/L (ref 0–37)
Albumin: 3.7 g/dL (ref 3.5–5.2)
Alkaline Phosphatase: 84 U/L (ref 39–117)
BUN: 19 mg/dL (ref 6–23)
CO2: 20 mEq/L (ref 19–32)
Calcium: 9.6 mg/dL (ref 8.4–10.5)
Chloride: 97 mEq/L (ref 96–112)
Creatinine, Ser: 0.58 mg/dL (ref 0.50–1.10)
GFR calc Af Amer: >90 mL/min (ref >90)
GFR calc non Af Amer: >90 mL/min (ref >90)
Glucose, Bld: 442 mg/dL — ABNORMAL HIGH (ref 70–99)
Potassium: 4 mEq/L (ref 3.5–5.1)
Sodium: 138 mEq/L (ref 135–145)
Total Bilirubin: 0.6 mg/dL (ref 0.3–1.2)
Total Protein: 7.6 g/dL (ref 6.0–8.3)

## 2012-12-30 LAB — GLUCOSE, CAPILLARY
Glucose-Capillary: 412 mg/dL — ABNORMAL HIGH (ref 70–99)
Glucose-Capillary: 290 mg/dL — ABNORMAL HIGH (ref 70–99)

## 2012-12-30 LAB — CBC WITH DIFFERENTIAL/PLATELET
Basophils Absolute: 0 10*3/uL (ref 0.0–0.1)
Basophils Relative: 0 % (ref 0–1)
Eosinophils Absolute: 0 10*3/uL (ref 0.0–0.7)
Eosinophils Relative: 0 % (ref 0–5)
HCT: 38.6 % (ref 36.0–46.0)
Hemoglobin: 13.8 g/dL (ref 12.0–15.0)
Lymphocytes Relative: 11 % — ABNORMAL LOW (ref 12–46)
Lymphs Abs: 0.6 10*3/uL — ABNORMAL LOW (ref 0.7–4.0)
MCH: 29.9 pg (ref 26.0–34.0)
MCHC: 35.8 g/dL (ref 30.0–36.0)
MCV: 83.7 fL (ref 78.0–100.0)
Monocytes Absolute: 0.2 10*3/uL (ref 0.1–1.0)
Monocytes Relative: 4 % (ref 3–12)
Neutro Abs: 4.4 10*3/uL (ref 1.7–7.7)
Neutrophils Relative %: 85 % — ABNORMAL HIGH (ref 43–77)
Platelets: 251 10*3/uL (ref 150–400)
RBC: 4.61 MIL/uL (ref 3.87–5.11)
RDW: 12.2 % (ref 11.5–15.5)
WBC: 5.1 10*3/uL (ref 4.0–10.5)

## 2012-12-30 LAB — URINALYSIS, ROUTINE W REFLEX MICROSCOPIC
Bilirubin Urine: NEGATIVE
Glucose, UA: >1000 mg/dL — AB
Ketones, ur: >80 mg/dL — AB
Leukocytes, UA: NEGATIVE
Nitrite: NEGATIVE
Protein, ur: 100 mg/dL — AB
Specific Gravity, Urine: 1.031 — ABNORMAL HIGH (ref 1.005–1.030)
Urobilinogen, UA: 0.2 mg/dL (ref 0.0–1.0)
pH: 5.5 (ref 5.0–8.0)

## 2012-12-30 LAB — D-DIMER, QUANTITATIVE: D-Dimer, Quant: 0.28 ug/mL-FEU (ref 0.00–0.48)

## 2012-12-30 LAB — TROPONIN I: Troponin I: <0.3 ng/mL (ref <0.30)

## 2012-12-30 LAB — LIPASE, BLOOD: Lipase: 12 U/L (ref 11–59)

## 2012-12-30 LAB — URINE MICROSCOPIC-ADD ON

## 2012-12-30 MED ORDER — ONDANSETRON HCL 4 MG/2ML IJ SOLN
4.0000 mg | Freq: Once | INTRAMUSCULAR | Status: AC
  Administered 2012-12-30: 4 mg via INTRAVENOUS
  Filled 2012-12-30: qty 2

## 2012-12-30 MED ORDER — SODIUM CHLORIDE 0.9 % IV SOLN: 1000.0000 mL | Freq: Once | INTRAVENOUS | Status: DC

## 2012-12-30 MED ORDER — MORPHINE SULFATE 4 MG/ML IJ SOLN
4.0000 mg | Freq: Once | INTRAMUSCULAR | Status: AC
  Administered 2012-12-30: 4 mg via INTRAVENOUS
  Filled 2012-12-30: qty 1

## 2012-12-30 MED ORDER — INSULIN ASPART 100 UNIT/ML ~~LOC~~ SOLN
7.0000 [IU] | Freq: Once | SUBCUTANEOUS | Status: AC
  Administered 2012-12-30: 7 [IU] via SUBCUTANEOUS
  Filled 2012-12-30: qty 1

## 2012-12-30 MED ORDER — SODIUM CHLORIDE 0.9 % IV SOLN
1000.0000 mL | INTRAVENOUS | Status: DC
  Administered 2012-12-30: 1000 mL via INTRAVENOUS

## 2012-12-30 MED ORDER — DIPHENHYDRAMINE HCL 50 MG/ML IJ SOLN
25.0000 mg | Freq: Once | INTRAMUSCULAR | Status: AC
  Administered 2012-12-30: 25 mg via INTRAVENOUS
  Filled 2012-12-30: qty 1

## 2012-12-30 MED ORDER — INSULIN REGULAR HUMAN 100 UNIT/ML IJ SOLN: 7.0000 [IU] | Freq: Once | INTRAMUSCULAR | Status: DC

## 2012-12-30 MED ORDER — PROMETHAZINE HCL 25 MG PO TABS: 25.0000 mg | ORAL_TABLET | Freq: Four times a day (QID) | ORAL | Status: DC | PRN

## 2012-12-30 MED ORDER — METOCLOPRAMIDE HCL 5 MG/ML IJ SOLN
10.0000 mg | Freq: Once | INTRAMUSCULAR | Status: AC
  Administered 2012-12-30: 10 mg via INTRAVENOUS
  Filled 2012-12-30: qty 2

## 2012-12-30 NOTE — ED Notes (Signed)
Portable Xray done.

## 2012-12-30 NOTE — ED Notes (Signed)
Pt c/o sudden onset midsternal chest pressure that woke her from her sleep around midnight.  Pt denies accompanying sx, denies radiation of pain.

## 2012-12-30 NOTE — ED Provider Notes (Signed)
History     CSN: [id number]  Arrival date & time 12/30/12  [id number]   First MD Initiated Contact with Patient 12/30/12 [phone]      Chief Complaint  Patient presents with  . Chest Pain    (Consider location/radiation/quality/duration/timing/severity/associated sxs/prior treatment) HPI  Patient reports she's had a cough for the past few weeks. States her cough is dry and without fever. Yesterday she went to [facility] and got their over-the-counter cold and flu medication recommended by the pharmacist. She took it last night. Last night she woke up at midnight with lower sternal chest tightness and pressure. She states she's never had it before. The pain does not radiate. She denies shortness of breath or diaphoresis. She states she is having nausea and vomiting and is vomiting every 2 hours. She also points to her suprapubic area and states she's having pain there. She denies any urinary symptoms. She states she has not seen her physician because "I can't afford it". She states however she is taking her insulin. She states the last time she checked her blood sugar was at least 4 days ago and "it was fine".  PCP Dr. Lin Landsman  Past Medical History  Diagnosis Date  . Diabetes mellitus   . Hypotension     History reviewed. No pertinent past surgical history.  History reviewed. No pertinent family history. A younger sister has had a pacemaker and a stroke Mother patient has diabetes Father patient died in his 74s of a brain aneurysm Several siblings with hypertension and diabetes and one sister has lupus  History  Substance Use Topics  . Smoking status: No  . Smokeless tobacco: Not on file  . Alcohol Use: No  Lives at home Lives with spouse  OB History    Grav Para Term Preterm Abortions TAB SAB Ect Mult Living                  Review of Systems  All other systems reviewed and are negative.    Allergies  Review of patient's allergies indicates no known allergies.  Home  Medications   Current Outpatient Rx  Name  Route  Sig  Dispense  Refill  . ERYTHROMYCIN 2 % EX OINT      Apply to affected area 2 times daily   25 g   0   . INSULIN ASPART 100 UNIT/ML [place] SOLN   Subcutaneous   Inject into the skin 3 (three) times daily before meals. SLIDING SCALE ONLY  WITH MEALS          . PROMETHAZINE HCL 25 MG PO TABS   Oral   Take 1 tablet (25 mg total) by mouth every 6 (six) hours as needed for nausea.   10 tablet   0     BP 175/71  Pulse 108  Temp 98.5 F (36.9 C) (Oral)  Resp 18  SpO2 100%  Vital signs normal except tachycardia and hypertension   Physical Exam  Nursing note and vitals reviewed. Constitutional: She is oriented to person, place, and time.  Non-toxic appearance. She does not appear ill. She appears distressed.       Then patient has trouble lying still  HENT:  Head: Normocephalic and atraumatic.  Right Ear: External ear normal.  Left Ear: External ear normal.  Nose: Nose normal. No mucosal edema or rhinorrhea.  Mouth/Throat: Mucous membranes are normal. No dental abscesses or uvula swelling.       Dry tongue  Eyes: Conjunctivae normal and  EOM are normal. Pupils are equal, round, and reactive to light.  Neck: Normal range of motion and full passive range of motion without pain. Neck supple.  Cardiovascular: Normal rate, regular rhythm and normal heart sounds.  Exam reveals no gallop and no friction rub.   No murmur heard. Pulmonary/Chest: Effort normal and breath sounds normal. No respiratory distress. She has no wheezes. She has no rhonchi. She has no rales. She exhibits no tenderness and no crepitus.         Area of chest pain noted  Abdominal: Soft. Normal appearance and bowel sounds are normal. She exhibits no distension. There is tenderness. There is no rebound and no guarding.    Musculoskeletal: Normal range of motion. She exhibits no edema and no tenderness.       Moves all extremities well.   Neurological: She is  alert and oriented to person, place, and time. She has normal strength. No cranial nerve deficit.  Skin: Skin is warm, dry and intact. No rash noted. No erythema. No pallor.  Psychiatric: She has a normal mood and affect. Her speech is normal and behavior is normal. Her mood appears not anxious.    ED Course  Procedures (including critical care time)   Medications  0.9 %  sodium chloride infusion (0 mL Intravenous Stopped 12/30/12 1058)    Followed by  0.9 %  sodium chloride infusion (1000 mL Intravenous New Bag/Given 12/30/12 1100)  ondansetron (ZOFRAN) injection 4 mg (4 mg Intravenous Given 12/30/12 0937)  morphine 4 MG/ML injection 4 mg (4 mg Intravenous Given 12/30/12 0942)  insulin aspart (novoLOG) injection 7 Units (7 Units Subcutaneous Given 12/30/12 1100)  ondansetron (ZOFRAN) injection 4 mg (4 mg Intravenous Given 12/30/12 1124)  metoCLOPramide (REGLAN) injection 10 mg (10 mg Intravenous Given 12/30/12 1243)  diphenhydrAMINE (BENADRYL) injection 25 mg (25 mg Intravenous Given 12/30/12 1246)   Review prior chart shows she was having syncopal episodes in 2011. She was seen by Dr. Harrington Challenger of [facility] cardiology. At that point the patient was on blood pressure medications.  12:15 states her headache and chest pain is gone. Still has nausea after zofran x 2, will try another nausea med.   14:30 feeling much better, has been able to drink, ready to go home.   Results for orders placed during the hospital encounter of 12/30/12  GLUCOSE, CAPILLARY      Component Value Range   Glucose-Capillary 412 (*) 70 - 99 mg/dL  CBC WITH DIFFERENTIAL      Component Value Range   WBC 5.1  4.0 - 10.5 K/uL   RBC 4.61  3.87 - 5.11 MIL/uL   Hemoglobin 13.8  12.0 - 15.0 g/dL   HCT 38.6  36.0 - 46.0 %   MCV 83.7  78.0 - 100.0 fL   MCH 29.9  26.0 - 34.0 pg   MCHC 35.8  30.0 - 36.0 g/dL   RDW 12.2  11.5 - 15.5 %   Platelets 251  150 - 400 K/uL   Neutrophils Relative 85 (*) 43 - 77 %   Neutro Abs 4.4  1.7 -  7.7 K/uL   Lymphocytes Relative 11 (*) 12 - 46 %   Lymphs Abs 0.6 (*) 0.7 - 4.0 K/uL   Monocytes Relative 4  3 - 12 %   Monocytes Absolute 0.2  0.1 - 1.0 K/uL   Eosinophils Relative 0  0 - 5 %   Eosinophils Absolute 0.0  0.0 - 0.7 K/uL   Basophils Relative  0  0 - 1 %   Basophils Absolute 0.0  0.0 - 0.1 K/uL  COMPREHENSIVE METABOLIC PANEL      Component Value Range   Sodium 138  135 - 145 mEq/L   Potassium 4.0  3.5 - 5.1 mEq/L   Chloride 97  96 - 112 mEq/L   CO2 20  19 - 32 mEq/L   Glucose, Bld 442 (*) 70 - 99 mg/dL   BUN 19  6 - 23 mg/dL   Creatinine, Ser 0.58  0.50 - 1.10 mg/dL   Calcium 9.6  8.4 - 10.5 mg/dL   Total Protein 7.6  6.0 - 8.3 g/dL   Albumin 3.7  3.5 - 5.2 g/dL   AST 22  0 - 37 U/L   ALT 15  0 - 35 U/L   Alkaline Phosphatase 84  39 - 117 U/L   Total Bilirubin 0.6  0.3 - 1.2 mg/dL   GFR calc non Af Amer >90  >90 mL/min   GFR calc Af Amer >90  >90 mL/min  URINALYSIS, ROUTINE W REFLEX MICROSCOPIC      Component Value Range   Color, Urine YELLOW  YELLOW   APPearance CLEAR  CLEAR   Specific Gravity, Urine 1.031 (*) 1.005 - 1.030   pH 5.5  5.0 - 8.0   Glucose, UA >1000 (*) NEGATIVE mg/dL   Hgb urine dipstick SMALL (*) NEGATIVE   Bilirubin Urine NEGATIVE  NEGATIVE   Ketones, ur >80 (*) NEGATIVE mg/dL   Protein, ur 100 (*) NEGATIVE mg/dL   Urobilinogen, UA 0.2  0.0 - 1.0 mg/dL   Nitrite NEGATIVE  NEGATIVE   Leukocytes, UA NEGATIVE  NEGATIVE  TROPONIN I      Component Value Range   Troponin I <0.30  <0.30 ng/mL  LIPASE, BLOOD      Component Value Range   Lipase 12  11 - 59 U/L  D-DIMER, QUANTITATIVE      Component Value Range   D-Dimer, Quant 0.28  0.00 - 0.48 ug/mL-FEU  URINE MICROSCOPIC-ADD ON      Component Value Range   Squamous Epithelial / LPF RARE  RARE   RBC / HPF 0-2  <3 RBC/hpf  GLUCOSE, CAPILLARY      Component Value Range   Glucose-Capillary 290 (*) 70 - 99 mg/dL   Laboratory interpretation all normal except hyperglycemia, concentrated  urine    Dg Chest Portable 1 View  12/30/2012  *RADIOLOGY REPORT*  Clinical Data: Chest pain.  Cough.  PORTABLE CHEST - 1 VIEW  Comparison: Chest x-ray 10/07/2010.  Findings: Lung volumes are normal.  No consolidative airspace disease.  No pleural effusions.  No pneumothorax.  No pulmonary nodule or mass noted.  Pulmonary vasculature and the cardiomediastinal silhouette are within normal limits.  A small skin fold artifact is seen in the lateral aspect of the lower right hemithorax.  Atherosclerotic calcifications are noted within the arch of the aorta.  IMPRESSION: 1. No radiographic evidence of acute cardiopulmonary disease. 2.  Atherosclerosis.   Original Report Authenticated By: Vinnie Langton, M.D.      Date: 12/30/2012  Rate: 111  Rhythm: sinus tachycardia  QRS Axis: right  Intervals: normal  ST/T Wave abnormalities: nonspecific ST changes  Conduction Disutrbances:none  Narrative Interpretation: BAE  Old EKG Reviewed: changes noted from 04/10/2010 HR 95    1. Chest pain, atypical   2. Hyperglycemia   3. Dehydration   4. Nausea and vomiting    New Prescriptions   PROMETHAZINE (PHENERGAN)  25 MG TABLET    Take 1 tablet (25 mg total) by mouth every 6 (six) hours as needed for nausea.    Plan discharge  Rolland Porter, MD, [place], MD 12/30/12 [phone]

## 2013-01-02 ENCOUNTER — Emergency Department (HOSPITAL_COMMUNITY): Payer: Self-pay

## 2013-01-02 ENCOUNTER — Inpatient Hospital Stay (HOSPITAL_COMMUNITY)
Admission: EM | Admit: 2013-01-02 | Discharge: 2013-01-06 | DRG: 638 | Disposition: A | Discharge status: Home / Self Care | Payer: MEDICAID | Attending: Internal Medicine | Admitting: Internal Medicine

## 2013-01-02 ENCOUNTER — Encounter (HOSPITAL_COMMUNITY): Payer: Self-pay

## 2013-01-02 DIAGNOSIS — R5381 Other malaise: Secondary | ICD-10-CM

## 2013-01-02 DIAGNOSIS — J101 Influenza due to other identified influenza virus with other respiratory manifestations: Secondary | ICD-10-CM

## 2013-01-02 DIAGNOSIS — E1110 Type 2 diabetes mellitus with ketoacidosis without coma: Secondary | ICD-10-CM | POA: Diagnosis present

## 2013-01-02 DIAGNOSIS — E1010 Type 1 diabetes mellitus with ketoacidosis without coma: Secondary | ICD-10-CM

## 2013-01-02 DIAGNOSIS — E1149 Type 2 diabetes mellitus with other diabetic neurological complication: Secondary | ICD-10-CM | POA: Diagnosis present

## 2013-01-02 DIAGNOSIS — I959 Hypotension, unspecified: Secondary | ICD-10-CM | POA: Diagnosis not present

## 2013-01-02 DIAGNOSIS — E869 Volume depletion, unspecified: Secondary | ICD-10-CM | POA: Diagnosis present

## 2013-01-02 DIAGNOSIS — N390 Urinary tract infection, site not specified: Secondary | ICD-10-CM | POA: Diagnosis present

## 2013-01-02 DIAGNOSIS — E1065 Type 1 diabetes mellitus with hyperglycemia: Secondary | ICD-10-CM

## 2013-01-02 DIAGNOSIS — E1142 Type 2 diabetes mellitus with diabetic polyneuropathy: Secondary | ICD-10-CM | POA: Diagnosis present

## 2013-01-02 DIAGNOSIS — J1089 Influenza due to other identified influenza virus with other manifestations: Secondary | ICD-10-CM | POA: Diagnosis present

## 2013-01-02 DIAGNOSIS — E1042 Type 1 diabetes mellitus with diabetic polyneuropathy: Secondary | ICD-10-CM

## 2013-01-02 DIAGNOSIS — R531 Weakness: Secondary | ICD-10-CM

## 2013-01-02 DIAGNOSIS — R5383 Other fatigue: Secondary | ICD-10-CM | POA: Diagnosis present

## 2013-01-02 DIAGNOSIS — Z23 Encounter for immunization: Secondary | ICD-10-CM

## 2013-01-02 DIAGNOSIS — R079 Chest pain, unspecified: Secondary | ICD-10-CM | POA: Diagnosis present

## 2013-01-02 DIAGNOSIS — R55 Syncope and collapse: Secondary | ICD-10-CM

## 2013-01-02 DIAGNOSIS — Z8489 Family history of other specified conditions: Secondary | ICD-10-CM

## 2013-01-02 DIAGNOSIS — E1310 Other specified diabetes mellitus with ketoacidosis without coma: Principal | ICD-10-CM | POA: Diagnosis present

## 2013-01-02 DIAGNOSIS — I10 Essential (primary) hypertension: Secondary | ICD-10-CM

## 2013-01-02 HISTORY — DX: Type 2 diabetes mellitus with ketoacidosis without coma: E11.10

## 2013-01-02 HISTORY — DX: Family history of other specified conditions: Z84.89

## 2013-01-02 LAB — BASIC METABOLIC PANEL
BUN: 32 mg/dL — ABNORMAL HIGH (ref 6–23)
BUN: 28 mg/dL — ABNORMAL HIGH (ref 6–23)
BUN: 28 mg/dL — ABNORMAL HIGH (ref 6–23)
BUN: 25 mg/dL — ABNORMAL HIGH (ref 6–23)
BUN: 24 mg/dL — ABNORMAL HIGH (ref 6–23)
BUN: 19 mg/dL (ref 6–23)
CO2: 14 mEq/L — ABNORMAL LOW (ref 19–32)
CO2: 14 mEq/L — ABNORMAL LOW (ref 19–32)
CO2: 15 mEq/L — ABNORMAL LOW (ref 19–32)
CO2: 16 mEq/L — ABNORMAL LOW (ref 19–32)
CO2: 18 mEq/L — ABNORMAL LOW (ref 19–32)
CO2: 21 mEq/L (ref 19–32)
Calcium: 7.6 mg/dL — ABNORMAL LOW (ref 8.4–10.5)
Calcium: 7.4 mg/dL — ABNORMAL LOW (ref 8.4–10.5)
Calcium: 7.5 mg/dL — ABNORMAL LOW (ref 8.4–10.5)
Calcium: 7.6 mg/dL — ABNORMAL LOW (ref 8.4–10.5)
Calcium: 7.7 mg/dL — ABNORMAL LOW (ref 8.4–10.5)
Calcium: 7.7 mg/dL — ABNORMAL LOW (ref 8.4–10.5)
Chloride: 106 mEq/L (ref 96–112)
Chloride: 107 mEq/L (ref 96–112)
Chloride: 108 mEq/L (ref 96–112)
Chloride: 112 mEq/L (ref 96–112)
Chloride: 115 mEq/L — ABNORMAL HIGH (ref 96–112)
Chloride: 112 mEq/L (ref 96–112)
Creatinine, Ser: 0.73 mg/dL (ref 0.50–1.10)
Creatinine, Ser: 0.66 mg/dL (ref 0.50–1.10)
Creatinine, Ser: 0.68 mg/dL (ref 0.50–1.10)
Creatinine, Ser: 0.61 mg/dL (ref 0.50–1.10)
Creatinine, Ser: 0.62 mg/dL (ref 0.50–1.10)
Creatinine, Ser: 0.55 mg/dL (ref 0.50–1.10)
GFR calc Af Amer: >90 mL/min (ref >90)
GFR calc Af Amer: >90 mL/min (ref >90)
GFR calc Af Amer: >90 mL/min (ref >90)
GFR calc Af Amer: >90 mL/min (ref >90)
GFR calc Af Amer: >90 mL/min (ref >90)
GFR calc Af Amer: >90 mL/min (ref >90)
GFR calc non Af Amer: >90 mL/min (ref >90)
GFR calc non Af Amer: >90 mL/min (ref >90)
GFR calc non Af Amer: >90 mL/min (ref >90)
GFR calc non Af Amer: >90 mL/min (ref >90)
GFR calc non Af Amer: >90 mL/min (ref >90)
GFR calc non Af Amer: >90 mL/min (ref >90)
Glucose, Bld: 372 mg/dL — ABNORMAL HIGH (ref 70–99)
Glucose, Bld: 324 mg/dL — ABNORMAL HIGH (ref 70–99)
Glucose, Bld: 325 mg/dL — ABNORMAL HIGH (ref 70–99)
Glucose, Bld: 223 mg/dL — ABNORMAL HIGH (ref 70–99)
Glucose, Bld: 204 mg/dL — ABNORMAL HIGH (ref 70–99)
Glucose, Bld: 148 mg/dL — ABNORMAL HIGH (ref 70–99)
Potassium: 4.2 mEq/L (ref 3.5–5.1)
Potassium: 4 mEq/L (ref 3.5–5.1)
Potassium: 4 mEq/L (ref 3.5–5.1)
Potassium: 3.3 mEq/L — ABNORMAL LOW (ref 3.5–5.1)
Potassium: 3.4 mEq/L — ABNORMAL LOW (ref 3.5–5.1)
Potassium: 3.8 mEq/L (ref 3.5–5.1)
Sodium: 141 mEq/L (ref 135–145)
Sodium: 142 mEq/L (ref 135–145)
Sodium: 143 mEq/L (ref 135–145)
Sodium: 143 mEq/L (ref 135–145)
Sodium: 145 mEq/L (ref 135–145)
Sodium: 142 mEq/L (ref 135–145)

## 2013-01-02 LAB — COMPREHENSIVE METABOLIC PANEL
ALT: 12 U/L (ref 0–35)
AST: 19 U/L (ref 0–37)
Albumin: 3.1 g/dL — ABNORMAL LOW (ref 3.5–5.2)
Alkaline Phosphatase: 70 U/L (ref 39–117)
BUN: 38 mg/dL — ABNORMAL HIGH (ref 6–23)
CO2: 16 mEq/L — ABNORMAL LOW (ref 19–32)
Calcium: 8.8 mg/dL (ref 8.4–10.5)
Chloride: 96 mEq/L (ref 96–112)
Creatinine, Ser: 0.87 mg/dL (ref 0.50–1.10)
GFR calc Af Amer: 82 mL/min — ABNORMAL LOW (ref >90)
GFR calc non Af Amer: 71 mL/min — ABNORMAL LOW (ref >90)
Glucose, Bld: 495 mg/dL — ABNORMAL HIGH (ref 70–99)
Potassium: 4.5 mEq/L (ref 3.5–5.1)
Sodium: 138 mEq/L (ref 135–145)
Total Bilirubin: 0.4 mg/dL (ref 0.3–1.2)
Total Protein: 6.6 g/dL (ref 6.0–8.3)

## 2013-01-02 LAB — CBC WITH DIFFERENTIAL/PLATELET
Basophils Absolute: 0 10*3/uL (ref 0.0–0.1)
Basophils Relative: 0 % (ref 0–1)
Eosinophils Absolute: 0 10*3/uL (ref 0.0–0.7)
Eosinophils Relative: 0 % (ref 0–5)
HCT: 40.4 % (ref 36.0–46.0)
Hemoglobin: 14 g/dL (ref 12.0–15.0)
Lymphocytes Relative: 17 % (ref 12–46)
Lymphs Abs: 1.1 10*3/uL (ref 0.7–4.0)
MCH: 29.7 pg (ref 26.0–34.0)
MCHC: 34.7 g/dL (ref 30.0–36.0)
MCV: 85.8 fL (ref 78.0–100.0)
Monocytes Absolute: 0.5 10*3/uL (ref 0.1–1.0)
Monocytes Relative: 8 % (ref 3–12)
Neutro Abs: 4.8 10*3/uL (ref 1.7–7.7)
Neutrophils Relative %: 75 % (ref 43–77)
Platelets: 212 10*3/uL (ref 150–400)
RBC: 4.71 MIL/uL (ref 3.87–5.11)
RDW: 12.8 % (ref 11.5–15.5)
WBC: 6.4 10*3/uL (ref 4.0–10.5)

## 2013-01-02 LAB — GLUCOSE, CAPILLARY
Glucose-Capillary: 363 mg/dL — ABNORMAL HIGH (ref 70–99)
Glucose-Capillary: 326 mg/dL — ABNORMAL HIGH (ref 70–99)
Glucose-Capillary: 296 mg/dL — ABNORMAL HIGH (ref 70–99)
Glucose-Capillary: 252 mg/dL — ABNORMAL HIGH (ref 70–99)
Glucose-Capillary: 219 mg/dL — ABNORMAL HIGH (ref 70–99)
Glucose-Capillary: 201 mg/dL — ABNORMAL HIGH (ref 70–99)
Glucose-Capillary: 198 mg/dL — ABNORMAL HIGH (ref 70–99)
Glucose-Capillary: 164 mg/dL — ABNORMAL HIGH (ref 70–99)
Glucose-Capillary: 148 mg/dL — ABNORMAL HIGH (ref 70–99)

## 2013-01-02 LAB — URINE MICROSCOPIC-ADD ON

## 2013-01-02 LAB — URINALYSIS, ROUTINE W REFLEX MICROSCOPIC
Bilirubin Urine: NEGATIVE
Glucose, UA: >1000 mg/dL — AB
Ketones, ur: 40 mg/dL — AB
Nitrite: NEGATIVE
Protein, ur: 30 mg/dL — AB
Specific Gravity, Urine: 1.024 (ref 1.005–1.030)
Urobilinogen, UA: 0.2 mg/dL (ref 0.0–1.0)
pH: 5 (ref 5.0–8.0)

## 2013-01-02 LAB — INFLUENZA PANEL BY PCR (TYPE A & B)
H1N1 flu by pcr: DETECTED — AB
Influenza A By PCR: POSITIVE — AB
Influenza B By PCR: NEGATIVE

## 2013-01-02 LAB — TROPONIN I: Troponin I: <0.3 ng/mL (ref <0.30)

## 2013-01-02 LAB — MRSA PCR SCREENING: MRSA by PCR: NEGATIVE

## 2013-01-02 MED ORDER — ONDANSETRON HCL 4 MG/2ML IJ SOLN: 4.0000 mg | Freq: Four times a day (QID) | INTRAMUSCULAR | Status: DC | PRN

## 2013-01-02 MED ORDER — INSULIN REGULAR BOLUS VIA INFUSION
0.0000 [IU] | Freq: Three times a day (TID) | INTRAVENOUS | Status: DC
  Filled 2013-01-02: qty 10

## 2013-01-02 MED ORDER — SODIUM CHLORIDE 0.9 % IV SOLN: INTRAVENOUS | Status: DC

## 2013-01-02 MED ORDER — MAGIC MOUTHWASH
5.0000 mL | Freq: Every day | ORAL | Status: DC
  Administered 2013-01-02 – 2013-01-04 (×3): 5 mL via ORAL
  Filled 2013-01-02 (×5): qty 5

## 2013-01-02 MED ORDER — DEXTROSE-NACL 5-0.45 % IV SOLN: INTRAVENOUS | Status: DC

## 2013-01-02 MED ORDER — SODIUM CHLORIDE 0.9 % IV BOLUS (SEPSIS): 1000.0000 mL | Freq: Once | INTRAVENOUS | Status: DC

## 2013-01-02 MED ORDER — INFLUENZA VIRUS VACC SPLIT PF IM SUSP: 0.5000 mL | INTRAMUSCULAR | Status: DC

## 2013-01-02 MED ORDER — SODIUM CHLORIDE 0.9 % IV SOLN
INTRAVENOUS | Status: DC
  Administered 2013-01-02: 2.4 [IU]/h via INTRAVENOUS
  Filled 2013-01-02: qty 1

## 2013-01-02 MED ORDER — LORAZEPAM 2 MG/ML IJ SOLN: 0.5000 mg | Freq: Once | INTRAMUSCULAR | Status: DC

## 2013-01-02 MED ORDER — SODIUM CHLORIDE 0.9 % IV SOLN
1000.0000 mL | Freq: Once | INTRAVENOUS | Status: AC
  Administered 2013-01-02: 1000 mL via INTRAVENOUS

## 2013-01-02 MED ORDER — POTASSIUM CHLORIDE 10 MEQ/100ML IV SOLN
10.0000 meq | INTRAVENOUS | Status: AC
  Administered 2013-01-02 (×4): 10 meq via INTRAVENOUS
  Filled 2013-01-02 (×4): qty 100

## 2013-01-02 MED ORDER — ACETAMINOPHEN 325 MG PO TABS
650.0000 mg | ORAL_TABLET | Freq: Four times a day (QID) | ORAL | Status: DC | PRN
  Administered 2013-01-03 – 2013-01-05 (×5): 650 mg via ORAL
  Filled 2013-01-02 (×5): qty 2

## 2013-01-02 MED ORDER — SODIUM CHLORIDE 0.9 % IV SOLN: 1000.0000 mL | INTRAVENOUS | Status: DC

## 2013-01-02 MED ORDER — ONDANSETRON HCL 4 MG PO TABS: 4.0000 mg | ORAL_TABLET | Freq: Four times a day (QID) | ORAL | Status: DC | PRN

## 2013-01-02 MED ORDER — LORAZEPAM 2 MG/ML IJ SOLN
1.0000 mg | Freq: Once | INTRAMUSCULAR | Status: AC
  Administered 2013-01-02: 1 mg via INTRAVENOUS
  Filled 2013-01-02: qty 1

## 2013-01-02 MED ORDER — DEXTROSE-NACL 5-0.45 % IV SOLN
INTRAVENOUS | Status: DC
  Administered 2013-01-02: 14:00:00 via INTRAVENOUS

## 2013-01-02 MED ORDER — SODIUM CHLORIDE 0.9 % IV BOLUS (SEPSIS)
1000.0000 mL | Freq: Once | INTRAVENOUS | Status: AC
  Administered 2013-01-02: 1000 mL via INTRAVENOUS

## 2013-01-02 MED ORDER — GADOBENATE DIMEGLUMINE 529 MG/ML IV SOLN
10.0000 mL | Freq: Once | INTRAVENOUS | Status: AC | PRN
  Administered 2013-01-02: 10 mL via INTRAVENOUS

## 2013-01-02 MED ORDER — DEXTROSE 50 % IV SOLN: 25.0000 mL | INTRAVENOUS | Status: DC | PRN

## 2013-01-02 MED ORDER — SODIUM CHLORIDE 0.9 % IV SOLN
INTRAVENOUS | Status: DC
  Administered 2013-01-02: 14:00:00 via INTRAVENOUS

## 2013-01-02 MED ORDER — SODIUM CHLORIDE 0.9 % IV SOLN: 1000.0000 mL | Freq: Once | INTRAVENOUS | Status: DC

## 2013-01-02 MED ORDER — HEPARIN SODIUM (PORCINE) 5000 UNIT/ML IJ SOLN
5000.0000 [IU] | Freq: Three times a day (TID) | INTRAMUSCULAR | Status: DC
  Administered 2013-01-02 – 2013-01-06 (×11): 5000 [IU] via SUBCUTANEOUS
  Filled 2013-01-02 (×15): qty 1

## 2013-01-02 MED ORDER — OSELTAMIVIR PHOSPHATE 75 MG PO CAPS
75.0000 mg | ORAL_CAPSULE | Freq: Two times a day (BID) | ORAL | Status: DC
  Administered 2013-01-02 – 2013-01-06 (×8): 75 mg via ORAL
  Filled 2013-01-02 (×10): qty 1

## 2013-01-02 MED ORDER — SODIUM CHLORIDE 0.9 % IV SOLN
INTRAVENOUS | Status: AC
  Administered 2013-01-02: 2.2 [IU]/h via INTRAVENOUS
  Administered 2013-01-02: 1.6 [IU]/h via INTRAVENOUS
  Filled 2013-01-02: qty 1

## 2013-01-02 MED ORDER — DEXTROSE-NACL 5-0.45 % IV SOLN
INTRAVENOUS | Status: DC
  Administered 2013-01-02: 23:00:00 via INTRAVENOUS
  Administered 2013-01-03: 1000 mL via INTRAVENOUS

## 2013-01-02 MED ORDER — ACETAMINOPHEN 650 MG RE SUPP: 650.0000 mg | Freq: Four times a day (QID) | RECTAL | Status: DC | PRN

## 2013-01-02 NOTE — ED Notes (Signed)
Pt in MRI.CBG to be checked when when pt gets back and titrate insulin infusion.

## 2013-01-02 NOTE — ED Notes (Signed)
Pt attempted to use the restroom but dropped sample. MD aware.

## 2013-01-02 NOTE — ED Notes (Signed)
Family at bedside.  Daughter seems to be upset about something, very short with me and the other nurse.  Will not elaborate on anything I have asked her.

## 2013-01-02 NOTE — ED Provider Notes (Signed)
History     CSN: [id number]  Arrival date & time 01/02/13  [id number]   First MD Initiated Contact with Patient 01/02/13 [phone]      Chief Complaint  Patient presents with  . Hyperglycemia  . Weakness    (Consider location/radiation/quality/duration/timing/severity/associated sxs/prior treatment) HPI Amanda [name] is a 62 y.o. female with insulin-dependent diabetes complaining of generalized weakness, trouble walking, and nausua x3 days. States that "fine when laying down, but dizzy when getting up." Family states that "her sugar was in the 400s on Sunday." Denies chest pain, SOB, fever, cough, diarrhea, or urinary s/s.   Past Medical History  Diagnosis Date  . Diabetes mellitus   . Hypotension     History reviewed. No pertinent past surgical history.  No family history on file.  History  Substance Use Topics  . Smoking status: Never Smoker   . Smokeless tobacco: Never Used  . Alcohol Use: No    OB History    Grav Para Term Preterm Abortions TAB SAB Ect Mult Living                  Review of Systems  Constitutional: Positive for activity change. Negative for fever.       Trouble walking.  Respiratory: Negative for cough, chest tightness and shortness of breath.   Cardiovascular: Negative for chest pain.  Genitourinary: Negative for dysuria and difficulty urinating.  Musculoskeletal: Positive for gait problem.       Feels unsteady when walking due to dizziness.  Neurological: Positive for dizziness and weakness. Negative for speech difficulty.    Allergies  Review of patient's allergies indicates no known allergies.  Home Medications   Current Outpatient Rx  Name  Route  Sig  Dispense  Refill  . ERYTHROMYCIN 2 % EX OINT      Apply to affected area 2 times daily   25 g   0   . INSULIN ASPART 100 UNIT/ML [place] SOLN   Subcutaneous   Inject into the skin 3 (three) times daily before meals. SLIDING SCALE ONLY  WITH MEALS          . PROMETHAZINE HCL 25 MG PO  TABS   Oral   Take 1 tablet (25 mg total) by mouth every 6 (six) hours as needed for nausea.   10 tablet   0     BP 119/77  Pulse 102  Temp 98.3 F (36.8 C)  Resp 20  SpO2 100%  Physical Exam  Nursing note and vitals reviewed. Constitutional: She is oriented to person, place, and time. She appears well-developed and well-nourished. No distress.  HENT:  Head: Normocephalic.  Eyes: Conjunctivae normal and EOM are normal. Pupils are equal, round, and reactive to light.       Dry MM  Neck: Normal range of motion. No JVD present.  Cardiovascular: Normal rate, regular rhythm and normal heart sounds.  Exam reveals no gallop and no friction rub.   No murmur heard. Pulmonary/Chest: Effort normal and breath sounds normal. No stridor. No respiratory distress. She has no wheezes. She has no rales. She exhibits no tenderness.  Abdominal: Soft. Bowel sounds are normal. She exhibits no distension and no mass. There is no tenderness. There is no rebound and no guarding.  Musculoskeletal: Normal range of motion.       Strength is 3/4 bilateral weakness.  Neurological: She is alert and oriented to person, place, and time.       Nonfocal, no facial droop, no  dysarthria strength is 5 out of 5x4 extremities distal sensation is grossly intact.  Psychiatric: She has a normal mood and affect.    ED Course  Procedures (including critical care time)  CRITICAL CARE Performed by: Monico Blitz   Total critical care time: 20  Critical care time was exclusive of separately billable procedures and treating other patients.  Critical care was necessary to treat or prevent imminent or life-threatening deterioration.  Critical care was time spent personally by me on the following activities: development of treatment plan with patient and/or surrogate as well as nursing, discussions with consultants, evaluation of patient's response to treatment, examination of patient, obtaining history from patient  or surrogate, ordering and performing treatments and interventions, ordering and review of laboratory studies, ordering and review of radiographic studies, pulse oximetry and re-evaluation of patient's condition.  Labs Reviewed  COMPREHENSIVE METABOLIC PANEL - Abnormal; Notable for the following:    CO2 16 (*)     Glucose, Bld 495 (*)     BUN 38 (*)     Albumin 3.1 (*)     GFR calc non Af Amer 71 (*)     GFR calc Af Amer 82 (*)     All other components within normal limits  GLUCOSE, CAPILLARY - Abnormal; Notable for the following:    Glucose-Capillary 363 (*)     All other components within normal limits  CBC WITH DIFFERENTIAL  TROPONIN I  URINALYSIS, ROUTINE W REFLEX MICROSCOPIC   No results found.   Date: 01/02/2013  Rate: 99  Rhythm: normal sinus rhythm  QRS Axis: right  Intervals: normal  ST/T Wave abnormalities: nonspecific T wave changes  Conduction Disutrbances:none  Narrative Interpretation:   Old EKG Reviewed: unchanged T-wave inversions in aVL and anterior leads are unchanged from prior   1. DKA (diabetic ketoacidoses)       MDM  Patient presenting with hyperglycemia and generalized weakness. Also with nausea vomiting diarrhea several days ago.  Patient was seen 3 days ago presenting with chest pain and chest tightness. For this reason an EKG and troponins were ordered both are normal.   Scene that shows anion gap of 26 with a bicarbonate of 16. Potassium is  normal at 4.5. Insulin drip and DKA protocol initiated.  Filed Vitals:   01/02/13 0907  BP: 119/77  Pulse: 102  Temp:   Resp: 20    Patient will be admitted to the care of Dr. Jacklynn Ganong Vinia Jemmott, PA-C 01/02/13 1032

## 2013-01-02 NOTE — ED Notes (Signed)
Checked patient blood sugar it was 363 notified RN Traci of blood sugar

## 2013-01-02 NOTE — ED Notes (Signed)
Pt still drowsy and refuses ativan.

## 2013-01-02 NOTE — ED Notes (Signed)
From home; presents with 1 week hx nausea, poor PO intake, anorexia and difficulty walking (bilateral leg weakness). Per EMS, no neuro deficits noted. No orthostatic changes. Positive for vertigo. CBG 502. 12 lead ST.

## 2013-01-02 NOTE — H&P (Signed)
Hospital Admission Note Date: 01/02/2013  Patient name: Amanda [name] Medical record number: [id number] Date of birth: 02-16-1951 Age: 62 y.o. Gender: female PCP: Kristine Garbe, MD  Medical Service: Internal Medicine Teaching Service  Attending physician:  Dr. Daryll Drown    1st Contact:  Dr. Sandy Salaam [id number] 2nd Contact:  Dr. Michail Sermon [id number] After 5 pm or weekends: 1st Contact:      Pager: [phone] 2nd Contact:      Pager: [phone]  Chief Complaint: generalized weakness on LE, Nausea  History of Present Illness: Amanda [name] is a 62 yo W with PMH of DM, HTN presents to the ED for nausea (but n o vomiting), and generalized weakness in lower legs.  Patient reports that Amanda [name] has been having a cough and rhinorrhea for about 2 weeks but now resolved. Last week Amanda [name] went to the emergency room and was seen for chest pain which is resolved. Amanda [name] reported leg weakness in the past several days. Amanda [name] also has chills, abdominal pain on the lower quadrants, increased in thirst, burning sensation with urinating, but denies any increasing urinary frequency or fever, chestpain, SOB, or cough. Amanda [name] also states that Amanda [name] has constipation in the past 6 months however sometimes Amanda [name] is unable to control her bowel and had explosive bowel movement. In the ED Amanda [name] also started to have a headache. As for her diabetes, Amanda [name] takes 6-10 units of NovoLog with meals. Of note, patient was seen in the ED on 12/30/12 and at that time her gap was 21 however Amanda [name] was sent home. Amanda [name] states that Amanda [name] is very anxious and would like some medication to calm her down.  Today in the ED, patient was found have a gap of 26 with blood glucose of 495. He was given 4 L of normal saline boluses.    Meds: Current Outpatient Rx  Name  Route  Sig  Dispense  Refill  . INSULIN ASPART 100 UNIT/ML [place] SOLN   Subcutaneous   Inject 6-10 Units into the skin 3 (three) times daily before meals. SLIDING SCALE ONLY  WITH MEALS            Allergies: Allergies as of 01/02/2013  . (No Known Allergies)   Past Medical History  Diagnosis Date  . Diabetes mellitus   . Hypotension    History reviewed. No pertinent past surgical history.  Family history: DM, HTN, breast cancer in sister, colon cancer in grandmother  History   Social History  . Marital Status: Married    Spouse Name: N/A    Number of Children: N/A  . Years of Education: N/A   Occupational History  . Not on file.   Social History Main Topics  . Smoking status: Never Smoker   . Smokeless tobacco: Never Used  . Alcohol Use: No  . Drug Use: No  . 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    Review of Systems: Constitutional: Denies fever, +chills, diaphoresis, appetite change and fatigue.  HEENT: Denies photophobia, eye pain, redness, hearing loss, ear pain, congestion, sore throat, rhinorrhea, sneezing, mouth sores, trouble swallowing, neck pain, neck stiffness and tinnitus.  Respiratory: Denies SOB, DOE, cough, chest tightness, and wheezing.  Cardiovascular: Denies chest pain, palpitations and leg swelling.  Gastrointestinal: + nausea, vomiting, abdominal pain, diarrhea, constipation, blood in stool and abdominal distention.  Genitourinary: Denies dysuria, urgency, frequency, hematuria, flank pain and difficulty urinating. +burning sensation Musculoskeletal: Denies myalgias, back pain, joint swelling, arthralgias and +gait problem.  Skin: Denies pallor, rash and wound.  Neurological: Denies dizziness, seizures, syncope, +weakness, light-headedness, numbness and+ headaches.  Hematological: Denies adenopathy. Easy bruising, personal or family bleeding history  Psychiatric/Behavioral: Denies suicidal ideation, mood changes, confusion, +nervousness, sleep disturbance and agitation  Physical Exam: Blood pressure 139/78, pulse 102, temperature 98.3 F (36.8 C), resp. rate 17, height 5\' 2"  (1.575 m), weight 120 lb  (54.432 kg), SpO2 100.00%. General: somewhat lethargic, well-developed, and cooperative to examination.  Head: normocephalic and atraumatic.  Eyes: vision grossly intact, pupils ~28mm, pupils round, pupils reactive to light, no injection and anicteric.  Mouth: pharynx pink and moist, no erythema, and +thrush.  Neck: supple, full ROM, no thyromegaly, no JVD, and no carotid bruits.  Lungs: normal respiratory effort, no accessory muscle use, normal breath sounds, no crackles, and no wheezes. Heart: tachycardic, regular rhythm, no murmur, no gallop, and no rub.  Abdomen: soft, +tender to palpation of the lower quadrants, normal bowel sounds, no distention, no guarding, no rebound tenderness, no hepatomegaly, and no splenomegaly.  Msk: no joint swelling, no joint warmth, and no redness over joints.  Pulses: 2+ DP/PT pulses bilaterally Extremities: No cyanosis, clubbing, edema Neurologic: alert & oriented X3, cranial nerves II-XII intact, LE 4/5 bilaterally, UE 4/5 bilaterally, sensation intact to light touch, and gait not tested due to weakness, patellar reflexes +2  Skin: turgor normal and no rashes.  Psych: Oriented X3, memory intact for recent and remote, normally interactive, good eye contact, + anxious appearing, and + depressed appearing.  Lab results: Basic Metabolic Panel:  Basename 01/02/13 0941 01/02/13 0733  NA 141 138  K 4.2 4.5  CL 106 96  CO2 14* 16*  GLUCOSE 372* 495*  BUN 32* 38*  CREATININE 0.73 0.87  CALCIUM 7.6* 8.8  MG -- --  PHOS -- --   Liver Function Tests:  [facility] 01/02/13 0733  AST 19  ALT 12  ALKPHOS 70  BILITOT 0.4  PROT 6.6  ALBUMIN 3.1*   CBC:  Basename 01/02/13 0733  WBC 6.4  NEUTROABS 4.8  HGB 14.0  HCT 40.4  MCV 85.8  PLT 212   Cardiac Enzymes:  Basename 01/02/13 0733  CKTOTAL --  CKMB --  CKMBINDEX --  TROPONINI <0.30   CBG:  Basename 01/02/13 1108 01/02/13 0952 01/02/13 0851 12/30/12 1159  GLUCAP 296* 326* 363* 290*    Imaging results:  Dg Chest 2 View  01/02/2013  *RADIOLOGY REPORT*  Clinical Data: Cough, diabetic ketoacidosis  CHEST - 2 VIEW  Comparison: Prior chest x-ray 12/30/2012  Findings: The lungs are well-aerated and free from pulmonary edema, focal airspace consolidation or pulmonary nodule.  Cardiac and mediastinal contours are within normal limits.  Query trace aortic atherosclerotic calcifications.  No pneumothorax, or pleural effusion. No acute osseous findings.  IMPRESSION:  No acute cardiopulmonary disease, specifically no evidence of pneumonia.   Original Report Authenticated By: Jacqulynn Cadet, M.D.     Other results: EKG: sinus rhythm, T waves inversion in V3-4 and u waves. Normal axis.  Assessment & Plan by Problem: Principal Problem:   1. DKA (diabetic ketoacidoses)- Anion Gap of 26 and glu 495 (insulin was ordered in ED but was not started until I saw patient).  No clear source of infection at this time. Amanda [name] was in ED on 12/31/11 and CXR was negative and urine did not show any nitrites or leukocytes, Amanda [name] did have ketone in her urine at that time and also had a gap of 21 but was sent home.  Tro is negative and no new ST changes to suggest ACS.    -Admit to SDU -Start Glucomander protocol -Monitor K and replete as needed -BMP q2hrs until gap closes -keep NPO  -Zofran for N/V PRN -Patient received 4L of NS in ED, will continue IVF @ 125 cc/hr NS -Check Urinalysis, urine ctx, Chest Xray -Check HbA1c--Her last HbA1c was 7.2 in 03/2010.     2. Volume contraction: BUN/Cr ratio is 43.67, likely from hyperglycemia/DKA.  Will continue IVF hydration.  3. Lower extremities weakness: unclear etiology at this time but this could be viral therefore, GBS is on the differential given that Amanda [name] had URI symptoms about 2 weeks ago vs. Acute illness related to her DKA vs.  Spinal cord compression.  Her neurological exam is unremarkable except for LE weakness 4/5 bilaterally.   -Will keep close monitor  to make sure her respiratory status is ok -Will need PT -Get MRI of lumbar for further evaluation for cord compression -Will get LP if no improvement or worsen. -Will check vitals q4hrs   Dispo: Disposition is deferred at this time, awaiting improvement of current medical problems. Anticipated discharge in approximately 2-3 day(s).   The patient does have a current PCP (REESE,BETTI D, MD), therefore will not require OPC follow-up after discharge.   The patient does not have transportation limitations that hinder transportation to clinic appointments.  Signed: Toriann Spadoni 01/02/2013, 11:37 AM

## 2013-01-02 NOTE — ED Notes (Signed)
Pt returned from MRI.  Family in room.

## 2013-01-02 NOTE — ED Notes (Signed)
Checked patient blood sugar it was 326 notified RN Traci of blood sugar

## 2013-01-03 ENCOUNTER — Inpatient Hospital Stay (HOSPITAL_COMMUNITY): Payer: Self-pay

## 2013-01-03 DIAGNOSIS — IMO0001 Reserved for inherently not codable concepts without codable children: Secondary | ICD-10-CM

## 2013-01-03 DIAGNOSIS — E1110 Type 2 diabetes mellitus with ketoacidosis without coma: Secondary | ICD-10-CM

## 2013-01-03 DIAGNOSIS — N390 Urinary tract infection, site not specified: Secondary | ICD-10-CM | POA: Diagnosis present

## 2013-01-03 DIAGNOSIS — R55 Syncope and collapse: Secondary | ICD-10-CM

## 2013-01-03 DIAGNOSIS — J101 Influenza due to other identified influenza virus with other respiratory manifestations: Secondary | ICD-10-CM

## 2013-01-03 DIAGNOSIS — I10 Essential (primary) hypertension: Secondary | ICD-10-CM

## 2013-01-03 DIAGNOSIS — E1165 Type 2 diabetes mellitus with hyperglycemia: Secondary | ICD-10-CM

## 2013-01-03 HISTORY — DX: Influenza due to other identified influenza virus with other respiratory manifestations: J10.1

## 2013-01-03 LAB — BASIC METABOLIC PANEL
BUN: 15 mg/dL (ref 6–23)
BUN: 11 mg/dL (ref 6–23)
CO2: 21 mEq/L (ref 19–32)
CO2: 20 mEq/L (ref 19–32)
Calcium: 7.7 mg/dL — ABNORMAL LOW (ref 8.4–10.5)
Calcium: 7.9 mg/dL — ABNORMAL LOW (ref 8.4–10.5)
Chloride: 110 mEq/L (ref 96–112)
Chloride: 104 mEq/L (ref 96–112)
Creatinine, Ser: 0.55 mg/dL (ref 0.50–1.10)
Creatinine, Ser: 0.53 mg/dL (ref 0.50–1.10)
GFR calc Af Amer: >90 mL/min (ref >90)
GFR calc Af Amer: >90 mL/min (ref >90)
GFR calc non Af Amer: >90 mL/min (ref >90)
GFR calc non Af Amer: >90 mL/min (ref >90)
Glucose, Bld: 170 mg/dL — ABNORMAL HIGH (ref 70–99)
Glucose, Bld: 161 mg/dL — ABNORMAL HIGH (ref 70–99)
Potassium: 3.6 mEq/L (ref 3.5–5.1)
Potassium: 3.3 mEq/L — ABNORMAL LOW (ref 3.5–5.1)
Sodium: 139 mEq/L (ref 135–145)
Sodium: 135 mEq/L (ref 135–145)

## 2013-01-03 LAB — GLUCOSE, CAPILLARY
Glucose-Capillary: 113 mg/dL — ABNORMAL HIGH (ref 70–99)
Glucose-Capillary: 114 mg/dL — ABNORMAL HIGH (ref 70–99)
Glucose-Capillary: 123 mg/dL — ABNORMAL HIGH (ref 70–99)
Glucose-Capillary: 130 mg/dL — ABNORMAL HIGH (ref 70–99)
Glucose-Capillary: 137 mg/dL — ABNORMAL HIGH (ref 70–99)
Glucose-Capillary: 140 mg/dL — ABNORMAL HIGH (ref 70–99)
Glucose-Capillary: 145 mg/dL — ABNORMAL HIGH (ref 70–99)
Glucose-Capillary: 155 mg/dL — ABNORMAL HIGH (ref 70–99)
Glucose-Capillary: 182 mg/dL — ABNORMAL HIGH (ref 70–99)
Glucose-Capillary: 210 mg/dL — ABNORMAL HIGH (ref 70–99)
Glucose-Capillary: 229 mg/dL — ABNORMAL HIGH (ref 70–99)

## 2013-01-03 LAB — BLOOD GAS, ARTERIAL
Acid-base deficit: 1.4 mmol/L (ref 0.0–2.0)
Bicarbonate: 22.2 mEq/L (ref 20.0–24.0)
Drawn by: 281201
FIO2: 0.21 %
O2 Saturation: 90.1 %
Patient temperature: 98.6
TCO2: 23.2 mmol/L (ref 0–100)
pCO2 arterial: 33.1 mmHg — ABNORMAL LOW (ref 35.0–45.0)
pH, Arterial: 7.441 (ref 7.350–7.450)
pO2, Arterial: 49.5 mmHg — ABNORMAL LOW (ref 80.0–100.0)

## 2013-01-03 LAB — CBC
HCT: 33.5 % — ABNORMAL LOW (ref 36.0–46.0)
Hemoglobin: 12.1 g/dL (ref 12.0–15.0)
MCH: 29.9 pg (ref 26.0–34.0)
MCHC: 36.1 g/dL — ABNORMAL HIGH (ref 30.0–36.0)
MCV: 82.7 fL (ref 78.0–100.0)
Platelets: 145 10*3/uL — ABNORMAL LOW (ref 150–400)
RBC: 4.05 MIL/uL (ref 3.87–5.11)
RDW: 12.1 % (ref 11.5–15.5)
WBC: 9 10*3/uL (ref 4.0–10.5)

## 2013-01-03 LAB — RPR: RPR Ser Ql: NONREACTIVE

## 2013-01-03 LAB — VITAMIN B12: Vitamin B-12: 1279 pg/mL — ABNORMAL HIGH (ref 211–911)

## 2013-01-03 LAB — HEMOGLOBIN A1C
Hgb A1c MFr Bld: 15.8 % — ABNORMAL HIGH (ref <5.7)
Mean Plasma Glucose: 407 mg/dL — ABNORMAL HIGH (ref <117)

## 2013-01-03 LAB — TSH: TSH: 0.76 u[IU]/mL (ref 0.350–4.500)

## 2013-01-03 LAB — FOLATE: Folate: 8.2 ng/mL

## 2013-01-03 LAB — SEDIMENTATION RATE: Sed Rate: 25 mm/hr — ABNORMAL HIGH (ref 0–22)

## 2013-01-03 MED ORDER — POTASSIUM CHLORIDE CRYS ER 20 MEQ PO TBCR
40.0000 meq | EXTENDED_RELEASE_TABLET | Freq: Two times a day (BID) | ORAL | Status: DC
  Administered 2013-01-03 – 2013-01-06 (×7): 40 meq via ORAL
  Filled 2013-01-03 (×9): qty 2

## 2013-01-03 MED ORDER — SODIUM CHLORIDE 0.9 % IV SOLN
INTRAVENOUS | Status: DC
  Administered 2013-01-03: 100 mL/h via INTRAVENOUS
  Administered 2013-01-03 – 2013-01-04 (×2): 1000 mL via INTRAVENOUS

## 2013-01-03 MED ORDER — INSULIN ASPART 100 UNIT/ML ~~LOC~~ SOLN: 0.0000 [IU] | Freq: Every day | SUBCUTANEOUS | Status: DC

## 2013-01-03 MED ORDER — SODIUM CHLORIDE 0.9 % IV BOLUS (SEPSIS)
500.0000 mL | Freq: Once | INTRAVENOUS | Status: AC
  Administered 2013-01-03: 500 mL via INTRAVENOUS

## 2013-01-03 MED ORDER — INSULIN ASPART 100 UNIT/ML ~~LOC~~ SOLN
0.0000 [IU] | Freq: Three times a day (TID) | SUBCUTANEOUS | Status: DC
  Administered 2013-01-03: 2 [IU] via SUBCUTANEOUS
  Administered 2013-01-03: 5 [IU] via SUBCUTANEOUS
  Administered 2013-01-04: 3 [IU] via SUBCUTANEOUS
  Administered 2013-01-04: 2 [IU] via SUBCUTANEOUS
  Administered 2013-01-04: 3 [IU] via SUBCUTANEOUS
  Administered 2013-01-05 (×2): 5 [IU] via SUBCUTANEOUS

## 2013-01-03 MED ORDER — DEXTROSE 5 % IV SOLN
1.0000 g | INTRAVENOUS | Status: DC
  Administered 2013-01-03: 1 g via INTRAVENOUS
  Filled 2013-01-03 (×2): qty 10

## 2013-01-03 MED ORDER — INSULIN ASPART 100 UNIT/ML ~~LOC~~ SOLN
0.0000 [IU] | Freq: Once | SUBCUTANEOUS | Status: AC
  Administered 2013-01-03: 2 [IU] via SUBCUTANEOUS

## 2013-01-03 MED ORDER — LORAZEPAM 2 MG/ML IJ SOLN
1.0000 mg | Freq: Once | INTRAMUSCULAR | Status: AC
  Administered 2013-01-03: 1 mg via INTRAVENOUS
  Filled 2013-01-03: qty 1

## 2013-01-03 MED ORDER — INSULIN GLARGINE 100 UNIT/ML ~~LOC~~ SOLN
15.0000 [IU] | Freq: Once | SUBCUTANEOUS | Status: AC
  Administered 2013-01-03: 15 [IU] via SUBCUTANEOUS

## 2013-01-03 NOTE — Progress Notes (Signed)
RT Note: NIF -35 x3, good pt effort noted

## 2013-01-03 NOTE — Consult Note (Signed)
NEURO HOSPITALIST CONSULT NOTE    Reason for Consult:LE weakness  HPI:                                                                                                                                          Amanda [name] is an 62 y.o. female with uncontrolled DM ( A1c 15), hypotension who was admitted to the hospital for N/V and generalized weakness more noted in LE bilaterally than UE. While in the hospital patient was noted to have UTI and Influenza A positive and recent temperature of 102.8. In addition, her glucose was 495. Per family members, patient was able to walk independently prior to hospitalization but while in the hospital she seems to be come increasingly weaker .  Today neurology was called to evaluate patient for her LE weakness.  Past Medical History  Diagnosis Date  . Diabetes mellitus   . Hypotension     Past Surgical History  Procedure Date  . Rotator cuff repair      Family History: Mother:DM, HTN Father: HTN, brain aneurysm Brother: lung cancer  Social History:  reports that she has never smoked. She has never used smokeless tobacco. She reports that she does not drink alcohol or use illicit drugs.  No Known Allergies  MEDICATIONS:                                                                                                                     Prior to Admission:  Prescriptions prior to admission  Medication Sig Dispense Refill  . insulin aspart (NOVOLOG) 100 UNIT/ML injection Inject 6-10 Units into the skin 3 (three) times daily before meals. SLIDING SCALE ONLY  WITH MEALS       Scheduled:   . cefTRIAXone (ROCEPHIN)  IV  1 g Intravenous Q24H  . heparin  5,000 Units Subcutaneous Q8H  . insulin aspart  0-15 Units Subcutaneous TID WC  . insulin aspart  0-5 Units Subcutaneous QHS  . LORazepam  0.5 mg Intravenous Once  . magic mouthwash  5 mL Oral Daily  . oseltamivir  75 mg Oral BID  . potassium chloride  40 mEq Oral BID      ROS:  History obtained from the patient  General ROS: negative for - chills, fatigue, fever, night sweats, weight gain or weight loss Psychological ROS: negative for - behavioral disorder, hallucinations, memory difficulties, mood swings or suicidal ideation Ophthalmic ROS: negative for - blurry vision, double vision, eye pain or loss of vision ENT ROS: negative for - epistaxis, nasal discharge, oral lesions, sore throat, tinnitus or vertigo Allergy and Immunology ROS: negative for - hives or itchy/watery eyes Hematological and Lymphatic ROS: negative for - bleeding problems, bruising or swollen lymph nodes Endocrine ROS: negative for - galactorrhea, hair pattern changes, polydipsia/polyuria or temperature intolerance Respiratory ROS: negative for - cough, hemoptysis, shortness of breath or wheezing Cardiovascular ROS: negative for - chest pain, dyspnea on exertion, edema or irregular heartbeat Gastrointestinal ROS: negative for - abdominal pain, diarrhea, hematemesis, nausea/vomiting or stool incontinence Genito-Urinary ROS: negative for - dysuria, hematuria, incontinence or urinary frequency/urgency Musculoskeletal ROS: negative for - joint swelling or muscular weakness Neurological ROS: as noted in HPI Dermatological ROS: negative for rash and skin lesion changes   Blood pressure 93/46, pulse 105, temperature 99.6 F (37.6 C), temperature source Oral, resp. rate 28, height 5' 1.5" (1.562 m), weight 61 kg (134 lb 7.7 oz), SpO2 93.00%.   Neurologic Examination:                                                                                                      Mental Status: Alert, oriented, thought content appropriate.  Speech fluent without evidence of aphasia.  Able to follow 3 step commands without difficulty. Cranial Nerves: II: Discs flat  bilaterally; Visual fields grossly normal, pupils equal, round, reactive to light and accommodation III,IV, VI: ptosis not present, extra-ocular motions intact bilaterally V,VII: smile symmetric, facial light touch sensation normal bilaterally VIII: hearing normal bilaterally IX,X: gag reflex present XI: bilateral shoulder shrug XII: midline tongue extension Motor: Right : Upper extremity   4/5    Left:     Upper extremity   4/5  Lower extremity   5/5     Lower extremity   5/5 (with goof effort) Tone and bulk:normal tone throughout; no atrophy noted Sensory: She has decreased sensation of bilateral distal LE and cannot Vibratory sensation at toes or ankles.  Deep Tendon Reflexes: 2+ and symmetric UE bilaterally, no KJ or AJ noted.  Plantars: Mute bilaterally Cerebellar: normal finger-to-nose,  Gait:  Patient is able to stand at edge of bed, sways back and forth showing genera weakness and some trunk instability.  She is able to take 2-3 small short steps but gives little effort. When she was places back in the bed she flopped herself on her back and showed no effort to right herself.  CV: pulses palpable throughout     Lab Results  Component Value Date/Time   CHOL  Value: 139        ATP III CLASSIFICATION:  <200     mg/dL   Desirable  200-239  mg/dL   Borderline High  >=240    mg/dL   High  03/27/2010  3:38 AM    Results for orders placed during the hospital encounter of 01/02/13 (from the past 48 hour(s))  CBC WITH DIFFERENTIAL     Status: Normal   Collection Time   01/02/13  7:33 AM      Component Value Range Comment   WBC 6.4  4.0 - 10.5 K/uL    RBC 4.71  3.87 - 5.11 MIL/uL    Hemoglobin 14.0  12.0 - 15.0 g/dL    HCT 40.4  36.0 - 46.0 %    MCV 85.8  78.0 - 100.0 fL    MCH 29.7  26.0 - 34.0 pg    MCHC 34.7  30.0 - 36.0 g/dL    RDW 12.8  11.5 - 15.5 %    Platelets 212  150 - 400 K/uL    Neutrophils Relative 75  43 - 77 %    Neutro Abs 4.8  1.7 - 7.7 K/uL    Lymphocytes  Relative 17  12 - 46 %    Lymphs Abs 1.1  0.7 - 4.0 K/uL    Monocytes Relative 8  3 - 12 %    Monocytes Absolute 0.5  0.1 - 1.0 K/uL    Eosinophils Relative 0  0 - 5 %    Eosinophils Absolute 0.0  0.0 - 0.7 K/uL    Basophils Relative 0  0 - 1 %    Basophils Absolute 0.0  0.0 - 0.1 K/uL   COMPREHENSIVE METABOLIC PANEL     Status: Abnormal   Collection Time   01/02/13  7:33 AM      Component Value Range Comment   Sodium 138  135 - 145 mEq/L    Potassium 4.5  3.5 - 5.1 mEq/L    Chloride 96  96 - 112 mEq/L    CO2 16 (*) 19 - 32 mEq/L    Glucose, Bld 495 (*) 70 - 99 mg/dL    BUN 38 (*) 6 - 23 mg/dL    Creatinine, Ser 0.87  0.50 - 1.10 mg/dL    Calcium 8.8  8.4 - 10.5 mg/dL    Total Protein 6.6  6.0 - 8.3 g/dL    Albumin 3.1 (*) 3.5 - 5.2 g/dL    AST 19  0 - 37 U/L    ALT 12  0 - 35 U/L    Alkaline Phosphatase 70  39 - 117 U/L    Total Bilirubin 0.4  0.3 - 1.2 mg/dL    GFR calc non Af Amer 71 (*) >90 mL/min    GFR calc Af Amer 82 (*) >90 mL/min   TROPONIN I     Status: Normal   Collection Time   01/02/13  7:33 AM      Component Value Range Comment   Troponin I <0.30  <0.30 ng/mL   GLUCOSE, CAPILLARY     Status: Abnormal   Collection Time   01/02/13  8:51 AM      Component Value Range Comment   Glucose-Capillary 363 (*) 70 - 99 mg/dL   BASIC METABOLIC PANEL     Status: Abnormal   Collection Time   01/02/13  9:41 AM      Component Value Range Comment   Sodium 141  135 - 145 mEq/L    Potassium 4.2  3.5 - 5.1 mEq/L    Chloride 106  96 - 112 mEq/L    CO2 14 (*) 19 - 32 mEq/L    Glucose, Bld 372 (*) 70 -  99 mg/dL    BUN 32 (*) 6 - 23 mg/dL    Creatinine, Ser 0.73  0.50 - 1.10 mg/dL    Calcium 7.6 (*) 8.4 - 10.5 mg/dL    GFR calc non Af Amer >90  >90 mL/min    GFR calc Af Amer >90  >90 mL/min   GLUCOSE, CAPILLARY     Status: Abnormal   Collection Time   01/02/13  9:52 AM      Component Value Range Comment   Glucose-Capillary 326 (*) 70 - 99 mg/dL   GLUCOSE, CAPILLARY      Status: Abnormal   Collection Time   01/02/13 11:08 AM      Component Value Range Comment   Glucose-Capillary 296 (*) 70 - 99 mg/dL   BASIC METABOLIC PANEL     Status: Abnormal   Collection Time   01/02/13 11:33 AM      Component Value Range Comment   Sodium 143  135 - 145 mEq/L    Potassium 4.0  3.5 - 5.1 mEq/L    Chloride 108  96 - 112 mEq/L    CO2 14 (*) 19 - 32 mEq/L    Glucose, Bld 324 (*) 70 - 99 mg/dL    BUN 28 (*) 6 - 23 mg/dL    Creatinine, Ser 0.68  0.50 - 1.10 mg/dL    Calcium 7.4 (*) 8.4 - 10.5 mg/dL    GFR calc non Af Amer >90  >90 mL/min    GFR calc Af Amer >90  >90 mL/min   BASIC METABOLIC PANEL     Status: Abnormal   Collection Time   01/02/13 12:09 PM      Component Value Range Comment   Sodium 142  135 - 145 mEq/L    Potassium 4.0  3.5 - 5.1 mEq/L    Chloride 107  96 - 112 mEq/L    CO2 15 (*) 19 - 32 mEq/L    Glucose, Bld 325 (*) 70 - 99 mg/dL    BUN 28 (*) 6 - 23 mg/dL    Creatinine, Ser 0.66  0.50 - 1.10 mg/dL    Calcium 7.5 (*) 8.4 - 10.5 mg/dL    GFR calc non Af Amer >90  >90 mL/min    GFR calc Af Amer >90  >90 mL/min   GLUCOSE, CAPILLARY     Status: Abnormal   Collection Time   01/02/13 12:19 PM      Component Value Range Comment   Glucose-Capillary 252 (*) 70 - 99 mg/dL   GLUCOSE, CAPILLARY     Status: Abnormal   Collection Time   01/02/13  1:23 PM      Component Value Range Comment   Glucose-Capillary 219 (*) 70 - 99 mg/dL   GLUCOSE, CAPILLARY     Status: Abnormal   Collection Time   01/02/13  2:22 PM      Component Value Range Comment   Glucose-Capillary 201 (*) 70 - 99 mg/dL   GLUCOSE, CAPILLARY     Status: Abnormal   Collection Time   01/02/13  3:54 PM      Component Value Range Comment   Glucose-Capillary 198 (*) 70 - 99 mg/dL   BASIC METABOLIC PANEL     Status: Abnormal   Collection Time   01/02/13  3:55 PM      Component Value Range Comment   Sodium 143  135 - 145 mEq/L    Potassium 3.3 (*) 3.5 - 5.1 mEq/L DELTA CHECK  NOTED   Chloride  112  96 - 112 mEq/L    CO2 16 (*) 19 - 32 mEq/L    Glucose, Bld 223 (*) 70 - 99 mg/dL    BUN 25 (*) 6 - 23 mg/dL    Creatinine, Ser 0.61  0.50 - 1.10 mg/dL    Calcium 7.6 (*) 8.4 - 10.5 mg/dL    GFR calc non Af Amer >90  >90 mL/min    GFR calc Af Amer >90  >90 mL/min   INFLUENZA PANEL BY PCR     Status: Abnormal   Collection Time   01/02/13  4:21 PM      Component Value Range Comment   Influenza A By PCR POSITIVE (*) NEGATIVE    Influenza B By PCR NEGATIVE  NEGATIVE    H1N1 flu by pcr DETECTED (*) NOT DETECTED   BASIC METABOLIC PANEL     Status: Abnormal   Collection Time   01/02/13  5:05 PM      Component Value Range Comment   Sodium 145  135 - 145 mEq/L    Potassium 3.4 (*) 3.5 - 5.1 mEq/L    Chloride 115 (*) 96 - 112 mEq/L    CO2 18 (*) 19 - 32 mEq/L    Glucose, Bld 204 (*) 70 - 99 mg/dL    BUN 24 (*) 6 - 23 mg/dL    Creatinine, Ser 0.62  0.50 - 1.10 mg/dL    Calcium 7.7 (*) 8.4 - 10.5 mg/dL    GFR calc non Af Amer >90  >90 mL/min    GFR calc Af Amer >90  >90 mL/min   HEMOGLOBIN A1C     Status: Abnormal   Collection Time   01/02/13  5:05 PM      Component Value Range Comment   Hemoglobin A1C 15.8 (*) <5.7 %    Mean Plasma Glucose 407 (*) <117 mg/dL   GLUCOSE, CAPILLARY     Status: Abnormal   Collection Time   01/02/13  5:22 PM      Component Value Range Comment   Glucose-Capillary 164 (*) 70 - 99 mg/dL   MRSA PCR SCREENING     Status: Normal   Collection Time   01/02/13  5:44 PM      Component Value Range Comment   MRSA by PCR NEGATIVE  NEGATIVE   GLUCOSE, CAPILLARY     Status: Abnormal   Collection Time   01/02/13  6:30 PM      Component Value Range Comment   Glucose-Capillary 148 (*) 70 - 99 mg/dL    Comment 1 Notify RN     GLUCOSE, CAPILLARY     Status: Abnormal   Collection Time   01/02/13  7:37 PM      Component Value Range Comment   Glucose-Capillary 155 (*) 70 - 99 mg/dL   GLUCOSE, CAPILLARY     Status: Abnormal   Collection Time   01/02/13  8:53 PM       Component Value Range Comment   Glucose-Capillary 114 (*) 70 - 99 mg/dL   BASIC METABOLIC PANEL     Status: Abnormal   Collection Time   01/02/13  9:20 PM      Component Value Range Comment   Sodium 142  135 - 145 mEq/L    Potassium 3.8  3.5 - 5.1 mEq/L    Chloride 112  96 - 112 mEq/L    CO2 21  19 - 32 mEq/L  Glucose, Bld 148 (*) 70 - 99 mg/dL    BUN 19  6 - 23 mg/dL    Creatinine, Ser 0.55  0.50 - 1.10 mg/dL    Calcium 7.7 (*) 8.4 - 10.5 mg/dL    GFR calc non Af Amer >90  >90 mL/min    GFR calc Af Amer >90  >90 mL/min   GLUCOSE, CAPILLARY     Status: Abnormal   Collection Time   01/02/13 10:48 PM      Component Value Range Comment   Glucose-Capillary 113 (*) 70 - 99 mg/dL   URINALYSIS, ROUTINE W REFLEX MICROSCOPIC     Status: Abnormal   Collection Time   01/02/13 10:59 PM      Component Value Range Comment   Color, Urine YELLOW  YELLOW    APPearance CLOUDY (*) CLEAR    Specific Gravity, Urine 1.024  1.005 - 1.030    pH 5.0  5.0 - 8.0    Glucose, UA >1000 (*) NEGATIVE mg/dL    Hgb urine dipstick TRACE (*) NEGATIVE    Bilirubin Urine NEGATIVE  NEGATIVE    Ketones, ur 40 (*) NEGATIVE mg/dL    Protein, ur 30 (*) NEGATIVE mg/dL    Urobilinogen, UA 0.2  0.0 - 1.0 mg/dL    Nitrite NEGATIVE  NEGATIVE    Leukocytes, UA SMALL (*) NEGATIVE   URINE MICROSCOPIC-ADD ON     Status: Abnormal   Collection Time   01/02/13 10:59 PM      Component Value Range Comment   Squamous Epithelial / LPF RARE  RARE    WBC, UA 11-20  <3 WBC/hpf    RBC / HPF 3-6  <3 RBC/hpf    Bacteria, UA FEW (*) RARE    Casts HYALINE CASTS (*) NEGATIVE    Crystals URIC ACID CRYSTALS (*) NEGATIVE   GLUCOSE, CAPILLARY     Status: Abnormal   Collection Time   01/02/13 11:53 PM      Component Value Range Comment   Glucose-Capillary 210 (*) 70 - 99 mg/dL   BASIC METABOLIC PANEL     Status: Abnormal   Collection Time   01/03/13 12:36 AM      Component Value Range Comment   Sodium 139  135 - 145 mEq/L    Potassium  3.6  3.5 - 5.1 mEq/L    Chloride 110  96 - 112 mEq/L    CO2 21  19 - 32 mEq/L    Glucose, Bld 170 (*) 70 - 99 mg/dL    BUN 15  6 - 23 mg/dL    Creatinine, Ser 0.55  0.50 - 1.10 mg/dL    Calcium 7.7 (*) 8.4 - 10.5 mg/dL    GFR calc non Af Amer >90  >90 mL/min    GFR calc Af Amer >90  >90 mL/min   GLUCOSE, CAPILLARY     Status: Abnormal   Collection Time   01/03/13  1:01 AM      Component Value Range Comment   Glucose-Capillary 140 (*) 70 - 99 mg/dL   GLUCOSE, CAPILLARY     Status: Abnormal   Collection Time   01/03/13  2:25 AM      Component Value Range Comment   Glucose-Capillary 123 (*) 70 - 99 mg/dL   BASIC METABOLIC PANEL     Status: Abnormal   Collection Time   01/03/13  4:45 AM      Component Value Range Comment   Sodium 135  135 -  145 mEq/L    Potassium 3.3 (*) 3.5 - 5.1 mEq/L    Chloride 104  96 - 112 mEq/L    CO2 20  19 - 32 mEq/L    Glucose, Bld 161 (*) 70 - 99 mg/dL    BUN 11  6 - 23 mg/dL    Creatinine, Ser 0.53  0.50 - 1.10 mg/dL    Calcium 7.9 (*) 8.4 - 10.5 mg/dL    GFR calc non Af Amer >90  >90 mL/min    GFR calc Af Amer >90  >90 mL/min   GLUCOSE, CAPILLARY     Status: Abnormal   Collection Time   01/03/13  5:54 AM      Component Value Range Comment   Glucose-Capillary 182 (*) 70 - 99 mg/dL   GLUCOSE, CAPILLARY     Status: Abnormal   Collection Time   01/03/13  8:32 AM      Component Value Range Comment   Glucose-Capillary 229 (*) 70 - 99 mg/dL    Comment 1 Notify RN     GLUCOSE, CAPILLARY     Status: Abnormal   Collection Time   01/03/13 12:26 PM      Component Value Range Comment   Glucose-Capillary 137 (*) 70 - 99 mg/dL    Comment 1 Notify RN     CBC     Status: Abnormal   Collection Time   01/03/13 12:50 PM      Component Value Range Comment   WBC 9.0  4.0 - 10.5 K/uL    RBC 4.05  3.87 - 5.11 MIL/uL    Hemoglobin 12.1  12.0 - 15.0 g/dL    HCT 33.5 (*) 36.0 - 46.0 %    MCV 82.7  78.0 - 100.0 fL    MCH 29.9  26.0 - 34.0 pg    MCHC 36.1 (*) 30.0 -  36.0 g/dL    RDW 12.1  11.5 - 15.5 %    Platelets 145 (*) 150 - 400 K/uL     Dg Chest 2 View  01/02/2013  *RADIOLOGY REPORT*  Clinical Data: Cough, diabetic ketoacidosis  CHEST - 2 VIEW  Comparison: Prior chest x-ray 12/30/2012  Findings: The lungs are well-aerated and free from pulmonary edema, focal airspace consolidation or pulmonary nodule.  Cardiac and mediastinal contours are within normal limits.  Query trace aortic atherosclerotic calcifications.  No pneumothorax, or pleural effusion. No acute osseous findings.  IMPRESSION:  No acute cardiopulmonary disease, specifically no evidence of pneumonia.   Original Report Authenticated By: Jacqulynn Cadet, M.D.    Mr Lumbar Spine W Wo Contrast  01/02/2013  *RADIOLOGY REPORT*  Clinical Data: Bilateral lower extremity weakness.  Bowel and bladder incontinence.  MRI LUMBAR SPINE WITHOUT AND WITH CONTRAST  Technique:  Multiplanar and multiecho pulse sequences of the lumbar spine were obtained without and with intravenous contrast.  Contrast: 45mL MULTIHANCE GADOBENATE DIMEGLUMINE 529 MG/ML IV SOLN  Comparison: None  Findings: Examination is limited by patient motion.  The sagittal MR images demonstrate normal alignment of the lumbar vertebral bodies.  They demonstrate normal marrow signal.  The last full intervertebral disc space is labeled L5-S1.  The conus medullaris terminates at L1 and appears normal.  The facets are normally aligned.  No pars defects.  No significant paraspinal or retroperitoneal process is identified.  There is moderate distention of the bladder.  No focal disc protrusions, spinal or foraminal stenosis at L1-2, L2- 3, L3-4 and L4-5  L5-S1:  Mild disc desiccation  and shallow central disc protrusion but no significant neural compression.  No foraminal stenosis.  IMPRESSION:  1. Mild disc desiccation and shallow central disc protrusion at L5- S1. 2. Normal appearance of the lower thoracic spinal cord. 3. Moderate distention of the bladder.    Original Report Authenticated By: Marijo Sanes, M.D.    Dg Chest Port 1 View  01/03/2013  *RADIOLOGY REPORT*  Clinical Data: Hypotension.  Flu.  PORTABLE CHEST - 1 VIEW  Comparison: 01/02/2013  Findings: Mild peribronchial thickening.  No confluent airspace opacities.  No effusions.  Heart is upper limits normal in size. No acute bony abnormality.  IMPRESSION: Peribronchial thickening.   Original Report Authenticated By: Rolm Baptise, M.D.      Assessment/Plan: 62 YO female being treated for uncontrolled DM, UTI, possible PNA, recent temperature of 102.8 this am, and influenza.  Since admission patient has shown increasing generalized weakness most pronounced in her LE. MRI L-spine negative. On exam patient shows normal LE strength, distal hyporeflexia, decreased sensory to both light touch and vibration in bilateral LE.  Given preserved strength hyporeflexia is likely secondary to long standing uncontrolled DM which would also account for her gait instability with generalized malaise.  As she does not show any upper motor neuronal signs likely not a cervical spine pathology.    Recommend:  1) Continue PT/OT 2) Control blood sugar 3) PCCM involved in respiratory care.  4) Given preserved LE strength, do not feel LP is needed at this time.    Etta Quill PA-C [facility] [phone]  01/03/2013, 1:26 PM

## 2013-01-03 NOTE — Progress Notes (Signed)
Rehab Admissions Coordinator Note:  Patient was screened by Cleatrice Burke for appropriateness for an Inpatient Acute Rehab Consult. Noted PT evaluation. Would like to see how pt progresses at next therapy session before an inpt rehab consult.Cleatrice Burke, RN 01/03/2013, 11:07 AM  I can be reached at [phone].

## 2013-01-03 NOTE — Evaluation (Signed)
Occupational Therapy Evaluation Patient Details Name: Amanda [name] MRN: [id number] DOB: [birth year] Today's Date: 01/03/2013 Time: 0935-1000 OT Time Calculation (min): 25 min  OT Assessment / Plan / Recommendation Clinical Impression  Pt admitted with DKA and flu. Pt demonstrating with significant weakness and decreased awareness of deficits.  Will benefit from acute OT services to address below problem list. Recommending CIR to further maximize pt safety and independence with ADLs in prep for eventual return home.      OT Assessment  Patient needs continued OT Services    Follow Up Recommendations  CIR    Barriers to Discharge      Equipment Recommendations   (TBD)    Recommendations for Other Services Rehab consult  Frequency  Min 2X/week    Precautions / Restrictions Precautions Precautions: Fall   Pertinent Vitals/Pain See vitals    ADL  Upper Body Bathing: Simulated;+1 Total assistance Where Assessed - Upper Body Bathing: Supported sitting Lower Body Bathing: Simulated;+1 Total assistance Where Assessed - Lower Body Bathing: Supine, head of bed up Upper Body Dressing: Performed;+1 Total assistance Where Assessed - Upper Body Dressing: Supported sitting Lower Body Dressing: Simulated;+1 Total assistance Where Assessed - Lower Body Dressing: Supine, head of bed up Toileting - Clothing Manipulation and Hygiene: Performed;+1 Total assistance Where Assessed - Toileting Clothing Manipulation and Hygiene: Rolling right and/or left;Supine, head of bed flat Equipment Used:  (none) ADL Comments: Pt with generalized weakness throughout.  Pt gown and linens wet with urine upon PT/OT arrival.  Pt sat EOB with assist while receiving assist to don clean gown.  Pt supine in bed and rolled L/R with assist for peri care.  New linens placed on bed. RN made aware.    OT Diagnosis: Cognitive deficits;Generalized weakness  OT Problem List: Decreased strength;Decreased activity  tolerance;Impaired balance (sitting and/or standing);Decreased cognition;Impaired UE functional use;Decreased knowledge of use of DME or AE OT Treatment Interventions: Self-care/ADL training;Therapeutic exercise;DME and/or AE instruction;Therapeutic activities;Patient/family education;Balance training;Cognitive remediation/compensation;Neuromuscular education   OT Goals Acute Rehab OT Goals OT Goal Formulation: With patient Time For Goal Achievement: 01/17/13 Potential to Achieve Goals: Good ADL Goals Pt Will Perform Eating: with modified independence;Sitting, chair;Supine, head of bed up;with adaptive utensils;Supported ADL Goal: Eating - Progress: Goal set today Pt Will Perform Grooming: with modified independence;Sitting, chair;Sitting, edge of bed;Supported ADL Goal: Grooming - Progress: Goal set today Pt Will Transfer to Toilet: with mod assist;Stand pivot transfer;with DME;3-in-1 ADL Goal: Toilet Transfer - Progress: Goal set today Arm Goals Pt Will Perform AROM: Independently;to maintain range of motion;Bilateral upper extremities;10 reps Arm Goal: AROM - Progress: Goal set today Miscellaneous OT Goals Miscellaneous OT Goal #1: Pt will perform all aspects of bed mobility with mod assist as precursor for ADL retraining. OT Goal: Miscellaneous Goal #1 - Progress: Goal set today Miscellaneous OT Goal #2: Pt will perform static sitting balance task with min assist >5 min as precursor for functional transfer. OT Goal: Miscellaneous Goal #2 - Progress: Goal set today  Visit Information  Last OT Received On: 01/03/13 Assistance Needed: +2 PT/OT Co-Evaluation/Treatment: Yes    Subjective Data      Prior Functioning     Home Living Lives With: Significant other Available Help at Discharge: Family;Available 24 hours/day Type of Home: House Home Access: Stairs to enter [employer] of Steps: 3 Entrance Stairs-Rails: Right;Left;Can reach both Home Layout: One  level Bathroom Shower/Tub: Multimedia programmer: Standard Home Adaptive Equipment: None Prior Function Level of Independence: Independent Able to Take  Stairs?: Yes Driving: Yes Vocation: Retired Dominant Hand: Right         Vision/Perception     Cognition  Overall Cognitive Status: Impaired Area of Impairment: Following commands;Safety/judgement Arousal/Alertness: Awake/alert Orientation Level: Appears intact for tasks assessed Behavior During Session: Flat affect Following Commands: Follows one step commands inconsistently Safety/Judgement: Decreased awareness of need for assistance Cognition - Other Comments: pt unable to locate call button on remote. She stated she could hold the phone, unable to do so even with propping phone and arm. Pt appears generally disjointed from  conversation and instruction and stating she is attempting mobility but limited initiation and no awareness of lack of ability to perform task.     Extremity/Trunk Assessment Right Upper Extremity Assessment RUE ROM/Strength/Tone: Deficits RUE ROM/Strength/Tone Deficits: 2/5 throughout Left Upper Extremity Assessment LUE ROM/Strength/Tone: Deficits LUE ROM/Strength/Tone Deficits: 2/5 throughout Right Lower Extremity Assessment RLE ROM/Strength/Tone: Deficits RLE ROM/Strength/Tone Deficits: grossly 2/5, Pt not moving bil LE on command with transfers and assist would slightly elevate lower leg off surface with knees bent on bed grossly 2/5 minimal short arc quad Left Lower Extremity Assessment LLE ROM/Strength/Tone: Deficits LLE ROM/Strength/Tone Deficits: grossly 2/5, Pt not moving bil LE on command with transfers and assist would slightly elevate lower leg off surface with knees bent on bed grossly 2/5 minimal short arc quad Trunk Assessment Trunk Assessment: Other exceptions (pt not controlling trunk at any point during EOB activity)     Mobility Bed Mobility Bed Mobility: Rolling  Right;Rolling Left;Scooting to HOB;Right Sidelying to Sit;Sit to Supine;Sitting - Scoot to [employer] of Bed Rolling Right: 2: Max assist Rolling Left: 1: +1 Total assist Right Sidelying to Sit: 1: +2 Total assist Right Sidelying to Sit: Patient Percentage: 20% Sitting - Scoot to Edge of Bed: 1: +1 Total assist Sit to Supine: 1: +2 Total assist Sit to Supine: Patient Percentage: 10% Scooting to HOB: 1: +2 Total assist Scooting to [facility]: Patient Percentage: 0% Details for Bed Mobility Assistance: Pt with minimal use of bil UE with reaching despite cueing and assist for transfers. Pt generally floppy with tone of trunk and all extremities exhibiting low tone and lack of initiation throughout all mobility. Pt assisted to sitting with pad for scooting and positioning but unable to maintain trunk control and lowered back to surface. Rolled, cleaned and linens changed due to incontinent on arrival.      Shoulder Instructions     Exercise     Balance Static Sitting Balance Static Sitting - Balance Support: Feet supported Static Sitting - Level of Assistance: 1: +1 Total assist Static Sitting - Comment/# of Minutes: 5, cueing for bil UE use but pt not attempting to push or steady herself with either UE   End of Session OT - End of Session Activity Tolerance: Patient limited by fatigue Patient left: in bed;with call bell/phone within reach;with nursing in room;with bed alarm set Nurse Communication: Mobility status;Other (comment) (urinary incontinence in bed)  GO    01/03/2013 Darrol Jump OTR/L Pager [phone] Office [phone]  Darrol Jump 01/03/2013, 1:16 PM

## 2013-01-03 NOTE — ED Provider Notes (Signed)
Medical screening examination/treatment/procedure(s) were performed by non-physician practitioner and as supervising physician I was immediately available for consultation/collaboration.   Hoy Morn, MD 01/03/13 [phone]

## 2013-01-03 NOTE — Evaluation (Signed)
Physical Therapy Evaluation Patient Details Name: Amanda [name] MRN: [id number] DOB: 09-Apr-1951 Today's Date: 01/03/2013 Time: [id number] PT Time Calculation (min): 25 min  PT Assessment / Plan / Recommendation Clinical Impression  Pt admitted with DKA and flu. Pt able to provide PLOF and ansering orientation questions accurately but clearly unaware of her physical limitations at this time and seems unalarmed by the sudden change. Pt with significant global weakness, lack of motor planning, decreased safety awareness, flat affect and incontinence all limiting mobiltiy at this time. Pt will benefit from acute therapy to maximize mobility, gait, strength and function to return pt to PLOF and decrease burden of care.     PT Assessment  Patient needs continued PT services    Follow Up Recommendations  CIR;Supervision/Assistance - 24 hour    Does the patient have the potential to tolerate intense rehabilitation      Barriers to Discharge None      Equipment Recommendations  Other (comment) (TBD based on progression)    Recommendations for Other Services Rehab consult   Frequency Min 3X/week    Precautions / Restrictions Precautions Precautions: Fall   Pertinent Vitals/Pain No pain reported sats 91% on RA and HR 110-120      Mobility  Bed Mobility Bed Mobility: Rolling Right;Rolling Left;Scooting to HOB;Right Sidelying to Sit;Sit to Supine;Sitting - Scoot to [employer] of Bed Rolling Right: 2: Max assist Rolling Left: 1: +1 Total assist Right Sidelying to Sit: 1: +2 Total assist Right Sidelying to Sit: Patient Percentage: 20% Sitting - Scoot to Edge of Bed: 1: +1 Total assist Sit to Supine: 1: +2 Total assist Sit to Supine: Patient Percentage: 10% Scooting to HOB: 1: +2 Total assist Scooting to [facility]: Patient Percentage: 0% Details for Bed Mobility Assistance: Pt with minimal use of bil UE with reaching despite cueing and assist for transfers. Pt generally floppy with tone of trunk  and all extremities exhibiting low tone and lack of initiation throughout all mobility. Pt assisted to sitting with pad for scooting and positioning but unable to maintain trunk control and lowered back to surface. Rolled, cleaned and linens changed due to incontinent on arrival.  Transfers Transfers: Not assessed Ambulation/Gait Ambulation/Gait Assistance: Not tested (comment)    Shoulder Instructions     Exercises     PT Diagnosis: Generalized weakness;Altered mental status  PT Problem List: Decreased strength;Decreased cognition;Decreased activity tolerance;Decreased balance;Decreased mobility;Decreased coordination;Decreased knowledge of use of DME;Impaired tone;Decreased safety awareness PT Treatment Interventions: Gait training;Functional mobility training;Therapeutic activities;Therapeutic exercise;Balance training;Patient/family education;Cognitive remediation;Neuromuscular re-education   PT Goals Acute Rehab PT Goals PT Goal Formulation: With patient Time For Goal Achievement: 01/17/13 Potential to Achieve Goals: Good Pt will Roll Supine to Right Side: with mod assist PT Goal: Rolling Supine to Right Side - Progress: Goal set today Pt will Roll Supine to Left Side: with mod assist PT Goal: Rolling Supine to Left Side - Progress: Goal set today Pt will go Supine/Side to Sit: with mod assist PT Goal: Supine/Side to Sit - Progress: Goal set today Pt will Sit at Edge of Bed: with min assist;3-5 min;with bilateral upper extremity support PT Goal: Sit at Edge Of Bed - Progress: Goal set today Pt will go Sit to Supine/Side: with mod assist PT Goal: Sit to Supine/Side - Progress: Goal set today Pt will go Sit to Stand: with max assist PT Goal: Sit to Stand - Progress: Goal set today Pt will go Stand to Sit: with max assist PT Goal: Stand to Sit -  Progress: Goal set today  Visit Information  Last PT Received On: 01/03/13 Assistance Needed: +2 PT/OT Co-Evaluation/Treatment: Yes     Subjective Data  Subjective: "the nurse button"- the pt able to state the button to use but unable to locate it or point to it Patient Stated Goal: return home with spouse   Prior Functioning  Home Living Lives With: Significant other Available Help at Discharge: Family;Available 24 hours/day Type of Home: House Home Access: Stairs to enter [employer] of Steps: 3 Entrance Stairs-Rails: Right;Left;Can reach both Home Layout: One level Bathroom Shower/Tub: Multimedia programmer: Standard Home Adaptive Equipment: None Prior Function Level of Independence: Independent Driving: Yes Vocation: Retired    Associate Professor  Overall Cognitive Status: Impaired Area of Impairment: Following commands;Safety/judgement Arousal/Alertness: Awake/alert Orientation Level: Appears intact for tasks assessed Behavior During Session: Flat affect Following Commands: Follows one step commands inconsistently Safety/Judgement: Decreased awareness of need for assistance Cognition - Other Comments: pt unable to locate call button on remote. She stated she could hold the phone, unable to do so even with propping phone and arm. Pt appears generally disjointed from  conversation and instruction and stating she is attempting mobility but limited initiation and no awareness of lack of ability to perform task.     Extremity/Trunk Assessment Right Upper Extremity Assessment RUE ROM/Strength/Tone: Deficits Left Upper Extremity Assessment LUE ROM/Strength/Tone: Deficits Right Lower Extremity Assessment RLE ROM/Strength/Tone: Deficits RLE ROM/Strength/Tone Deficits: grossly 2/5, Pt not moving bil LE on command with transfers and assist would slightly elevate lower leg off surface with knees bent on bed grossly 2/5 minimal short arc quad Left Lower Extremity Assessment LLE ROM/Strength/Tone: Deficits LLE ROM/Strength/Tone Deficits: grossly 2/5, Pt not moving bil LE on command with transfers and  assist would slightly elevate lower leg off surface with knees bent on bed grossly 2/5 minimal short arc quad Trunk Assessment Trunk Assessment: Other exceptions (pt not controlling trunk at any point during EOB activity)   Balance Static Sitting Balance Static Sitting - Balance Support: Feet supported Static Sitting - Level of Assistance: 1: +1 Total assist Static Sitting - Comment/# of Minutes: 5, cueing for bil UE use but pt not attempting to push or steady herself with either UE  End of Session PT - End of Session Activity Tolerance: Patient limited by fatigue Patient left: in bed;with call bell/phone within reach;with nursing in room;with bed alarm set Nurse Communication: Mobility status  GP     Lanetta Inch Beth 01/03/2013, 10:15 AM  Elwyn Reach, [place]

## 2013-01-03 NOTE — Consult Note (Signed)
PULMONARY  / CRITICAL CARE MEDICINE  Name: Amanda [name] MRN: [id number] DOB: May 30, 1951    LOS: 25  REFERRING MD :  Dr Marinda Elk   CHIEF COMPLAINT:  Desatting   BRIEF PATIENT DESCRIPTION:  Amanda [name] is a 62 yo W with PMH of DM, HTN presents to the ED for nausea (but n o vomiting), and generalized weakness in lower legs. Patient was found to have H1N1 with temp of 102.8 today , significant  Generalized  weakness and desatting to the 90s. PCCM consulted for possible pulmonary involvement for possible Guillain -Barre sydnrome   LINES / TUBES: PIV  CULTURES: Blood culture 1/16 >> Urine culture 1/15 >>  ANTIBIOTICS: Tamiflu 1/15 >> Rocephine 1/16 >>  SIGNIFICANT EVENTS:  1/16 Patient was found to have H1N1 with temp of 102.8 today , significant  Generalized  weakness and desatting to the 90s.  LEVEL OF CARE:  SDU PRIMARY SERVICE:  Internal Medicine Teaching Service  CONSULTANTS:  PCCM, Neurology  CODE STATUS: FUll DIET:  Carb modified  DVT Px:  Heparin  GI Px:  None   HISTORY OF PRESENT ILLNESS:   Amanda [name] is a 62 yo W with PMH of DM, HTN presents to the ED for nausea (but n o vomiting), and generalized weakness in lower legs. Patient reports that she has been having a cough and rhinorrhea for about 2 weeks but now resolved. Last week she went to the emergency room and was seen for chest pain which is resolved. She reported leg weakness in the past several days. She also has chills, abdominal pain on the lower quadrants, increased in thirst, burning sensation with urinating, but denies any increasing urinary frequency or fever, chestpain, SOB, or cough. She also states that she has constipation in the past 6 months however sometimes she is unable to control her bowel and had explosive bowel movement. In the ED she also started to have a headache. As for her diabetes, she takes 6-10 units of NovoLog with meals. Of note, patient was seen in the ED on 12/30/12 and at that time her gap was  21 however she was sent home. She states that she is very anxious and would like some medication to calm her down.  On 1/15 in the ED, patient was found have a gap of 26 with blood glucose of 495. He was given 4 L of normal saline boluses   PAST MEDICAL HISTORY :  Past Medical History  Diagnosis Date  . Diabetes mellitus   . Hypotension    Past Surgical History  Procedure Date  . Rotator cuff repair    Prior to Admission medications   Medication Sig Start Date End Date Taking? Authorizing Provider  insulin aspart (NOVOLOG) 100 UNIT/ML injection Inject 6-10 Units into the skin 3 (three) times daily before meals. SLIDING SCALE ONLY  WITH MEALS   Yes Historical Provider, MD   No Known Allergies  FAMILY HISTORY:  History reviewed. No pertinent family history. SOCIAL HISTORY:  reports that she has never smoked. She has never used smokeless tobacco. She reports that she does not drink alcohol or use illicit drugs.  REVIEW OF SYSTEMS:  As per HPI otherwise negative      VITAL SIGNS: Temp:  [99.2 F (37.3 C)-102.8 F (39.3 C)] 99.6 F (37.6 C) (01/16 1555) Pulse Rate:  [98-118] 103  (01/16 1600) Resp:  [16-29] 26  (01/16 1600) BP: (93-179)/(46-87) 139/70 mmHg (01/16 1600) SpO2:  [91 %-98 %] 97 % (01/16 1600)  Weight:  [134 lb 7.7 oz (61 kg)] 134 lb 7.7 oz (61 kg) (01/16 0500) HEMODYNAMICS:      INTAKE / OUTPUT: Intake/Output      01/15 0701 - 01/16 0700 01/16 0701 - 01/17 0700   P.O.  240   I.V. (mL/kg) 4500 (73.8) 700 (11.5)   IV Piggyback 200 50   Total Intake(mL/kg) 4700 (77.1) 990 (16.2)   Urine (mL/kg/hr) 800 (0.5)    Total Output 800    Net +3900 +990        Urine Occurrence  1 x     PHYSICAL EXAMINATION: General:  In no acute distress. Off oxygen  Neuro:  Awake, alert and oriented , CN II- XII intact, 5/5 motor strength , Decreased sensation  HEENT: Perrl, EOMI Cardiovascular:  Tachycardia regular rate, no MRG  Lungs:  Normal Resp effort, Good air  movement, no wheezing  Abdomen:  Soft , non tender, BS + Musculoskeletal:  Moving all four extremities spontaneously  Skin:  Intact, no rashes    LABS: Cbc  Lab 01/03/13 1250 01/02/13 0733 12/30/12 0943  WBC 9.0 -- --  HGB 12.1 14.0 13.8  HCT 33.5* 40.4 38.6  PLT 145* 212 251    Chemistry   Lab 01/03/13 0445 01/03/13 0036 01/02/13 2120  NA 135 139 142  K 3.3* 3.6 3.8  CL 104 110 112  CO2 20 21 21   BUN 11 15 19   CREATININE 0.53 0.55 0.55  CALCIUM 7.9* 7.7* 7.7*  MG -- -- --  PHOS -- -- --  GLUCOSE 161* 170* 148*    Liver fxn  Lab 01/02/13 0733 12/30/12 0943  AST 19 22  ALT 12 15  ALKPHOS 70 84  BILITOT 0.4 0.6  PROT 6.6 7.6  ALBUMIN 3.1* 3.7    Cardiac markers  Lab 01/02/13 0733 12/30/12 0943  CKTOTAL -- --  CKMB -- --  TROPONINI <0.30 <0.30    ABG  Lab 01/03/13 1520  PHART 7.441  PCO2ART 33.1*  PO2ART 49.5*  HCO3 22.2  TCO2 23.2    CBG trend  Lab 01/03/13 1554 01/03/13 1226 01/03/13 0832 01/03/13 0554 01/03/13 0225  GLUCAP 130* 137* 229* 182* 123*    IMAGING: Chest xray 01/03/13  Clinical Data: Hypotension. Flu.  PORTABLE CHEST - 1 VIEW  Comparison: 01/02/2013  Findings: Mild peribronchial thickening. No confluent airspace  opacities. No effusions. Heart is upper limits normal in size.  No acute bony abnormality.  IMPRESSION:  Peribronchial thickening.    DIAGNOSES: Principal Problem:  *Influenza A (H1N1) Active Problems:  DKA (diabetic ketoacidoses)  Generalized weakness  UTI (lower urinary tract infection)   ASSESSMENT / PLAN:  PULMONARY  ASSESSMENT: H1N1 No evidence this far for neuro-resp failure component PLAN:   ABG now, ensure appropriate ventilation NIF and VC now and daily, goal more neg than 25, 10 cc/kg Keep sats > 93 % Continues oxygen monitoring pcxr in am Ensue tsh  CARDIOVASCULAR  ASSESSMENT: Sinus tachycardia  PLAN:  Mild Treat flu May need bolus  RENAL  ASSESSMENT:   Hypokalemia  AG  acidosis on admission - resolved PLAN:   Replete Electrolytes   HEMATOLOGIC  ASSESSMENT:  Thrombocytopenia  PLAN:  Cbc in am    INFECTIOUS  ASSESSMENT:   H1N1 UTI PLAN:   Continue Tamiflu for a total of 5-7 days  Continue Rocephin  ENDOCRINE   ASSESSMENT:   DKA - resolved Hgb A1c 15 PLAN:   Per primary team   NEUROLOGIC  ASSESSMENT:  Generalized weakness - resolved.  unlikely GBS  R/o hypokalemia component R/o diabetic neropathy PLAN:   Per Neuro and primary team Correct K Assessment see resp  CLINICAL SUMMARY: No evidence thus far GBS or ascending paralysis  I have personally obtained a history, examined the patient, evaluated laboratory and imaging results, formulated the assessment and plan and placed orders.  Lavon Paganini. Titus Mould, MD, [place] Pgr: [facility]  Pulmonary and [place] Pager: [phone]  01/03/2013, 5:21 PM

## 2013-01-03 NOTE — Progress Notes (Signed)
1/16  Received diabetes coordinator consult for diabetes education.  Spoke with nurse about this patient.  Patient is very weak, having low BP, unable to feed herself.  Patient non-teachable at this time. HgbA1C is 15.8 % at this time.  Is being followed by Internal Medicine teaching service.  Will continue to follow while in hospital.  Will need to determine what the home and medicine needs might be.  Harvel Ricks RN BSN CDE

## 2013-01-03 NOTE — H&P (Signed)
Internal Medicine Attending Admission Note Date: 01/03/2013  Patient name: Amanda [name] Medical record number: [id number] Date of birth: 07-28-1951 Age: 62 y.o. Gender: female  I saw and evaluated the patient. I reviewed the resident's note and I agree with the resident's findings and plan as documented in the resident's note, with the following additional comments.  Chief Complaint(s): Nausea, generalized weakness  History - key components related to admission: Patient is a 62 year old woman with a history of diabetes mellitus and hypertension, admitted with complaint of nausea and generalized weakness, along with recent chills, cough and rhinorrhea.    Physical Exam - key components related to admission:  Filed Vitals:   01/03/13 0800 01/03/13 0900 01/03/13 1004 01/03/13 1148  BP: 156/77 134/58  93/46  Pulse: 115 115 110 105  Temp:  102.8 F (39.3 C)  99.6 F (37.6 C)  TempSrc:  Oral  Oral  Resp: 22 28  28   Height:      Weight:      SpO2: 92% 94% 91% 93%    General: Alert, oriented Lungs: Scattered bilateral rhonchi Heart: Regular; S1-S2, no S3, no S4, no murmurs Abdomen: Bowel sounds present, soft, nontender Extremities: No edema Neurologic: Generalized weakness of upper and lower extremities, more pronounced in the lower extremities  Lab results:   Basic Metabolic Panel:  Basename 01/03/13 0445 01/03/13 0036  NA 135 139  K 3.3* 3.6  CL 104 110  CO2 20 21  GLUCOSE 161* 170*  BUN 11 15  CREATININE 0.53 0.55  CALCIUM 7.9* 7.7*  MG -- --  PHOS -- --   Liver Function Tests:  [facility] 01/02/13 0733  AST 19  ALT 12  ALKPHOS 70  BILITOT 0.4  PROT 6.6  ALBUMIN 3.1*    CBC:  Basename 01/02/13 0733  WBC 6.4  NEUTROABS 4.8  HGB 14.0  HCT 40.4  MCV 85.8  PLT 212    Cardiac Enzymes:  Basename 01/02/13 0733  CKTOTAL --  CKMB --  CKMBINDEX --  TROPONINI <0.30    CBG:  Basename 01/03/13 0832 01/03/13 0554 01/03/13 0225 01/03/13 0101 01/02/13  2353 01/02/13 2248  GLUCAP 229* 182* 123* 140* 210* 113*    Hemoglobin A1C:  Basename 01/02/13 1705  HGBA1C 15.8*    Imaging results:  Dg Chest 2 View  01/02/2013  *RADIOLOGY REPORT*  Clinical Data: Cough, diabetic ketoacidosis  CHEST - 2 VIEW  Comparison: Prior chest x-ray 12/30/2012  Findings: The lungs are well-aerated and free from pulmonary edema, focal airspace consolidation or pulmonary nodule.  Cardiac and mediastinal contours are within normal limits.  Query trace aortic atherosclerotic calcifications.  No pneumothorax, or pleural effusion. No acute osseous findings.  IMPRESSION:  No acute cardiopulmonary disease, specifically no evidence of pneumonia.   Original Report Authenticated By: Jacqulynn Cadet, M.D.    Mr Lumbar Spine W Wo Contrast  01/02/2013  *RADIOLOGY REPORT*  Clinical Data: Bilateral lower extremity weakness.  Bowel and bladder incontinence.  MRI LUMBAR SPINE WITHOUT AND WITH CONTRAST  Technique:  Multiplanar and multiecho pulse sequences of the lumbar spine were obtained without and with intravenous contrast.  Contrast: 57mL MULTIHANCE GADOBENATE DIMEGLUMINE 529 MG/ML IV SOLN  Comparison: None  Findings: Examination is limited by patient motion.  The sagittal MR images demonstrate normal alignment of the lumbar vertebral bodies.  They demonstrate normal marrow signal.  The last full intervertebral disc space is labeled L5-S1.  The conus medullaris terminates at L1 and appears normal.  The facets are normally  aligned.  No pars defects.  No significant paraspinal or retroperitoneal process is identified.  There is moderate distention of the bladder.  No focal disc protrusions, spinal or foraminal stenosis at L1-2, L2- 3, L3-4 and L4-5  L5-S1:  Mild disc desiccation and shallow central disc protrusion but no significant neural compression.  No foraminal stenosis.  IMPRESSION:  1. Mild disc desiccation and shallow central disc protrusion at L5- S1. 2. Normal appearance of the  lower thoracic spinal cord. 3. Moderate distention of the bladder.   Original Report Authenticated By: Marijo Sanes, M.D.     Other results: EKG: Sinus rhythm, borderline right axis deviation, abnormal R progression, borderline T abnormalities in the anterior leads.   Assessment & Plan by Problem:  #1.  Influenza.  Patient presented with symptoms consistent with influenza and a nasal specimen was positive for influenza A with H1N1 detected.  The plan is treat with Tamiflu; droplet precautions; supportive care; repeat chest x-ray.  #2.  Diabetic ketoacidosis.  Patient has uncontrolled diabetes evidence by her markedly elevated hemoglobin A1c, and DKA likely precipitated by influenza.  Her DKA corrected with IV insulin and normal saline volume replacement.  The plan is subcutaneous insulin and adjust regimen as indicated; follow electrolytes.  #3.  Generalized weakness.  This may debility secondary to influenza, but the severity of her weakness raises question of an acute neurological process including Guillain-Barre syndrome.  Plan is neurology consult; supportive care; follow neurologic status closely.  #4.  Volume depletion.  Plan is normal saline volume replacement; follow blood pressure closely; blood cultures given marginal hypotension; treat influenza as above.

## 2013-01-03 NOTE — Progress Notes (Signed)
Subjective: Patient much weaker this morning. Sleepy when I saw her early this morning and even more somnolent when I returned in early afternoon to see her. Interval worsening of bilateral upper and lower extremity weakness. Episode of hypotension (80s/50s). Pt remains tachycardic. Spiked T to 102.8. Flu A and H1N1 returned positive. Spiked T 102.8 late this morning.  In regards to DKA, AG closed overnight and she is on SQ insulin. Requiring assistance to eat her breakfast due to decreased strength.   Objective: Vital signs in last 24 hours: Filed Vitals:   01/03/13 0300 01/03/13 0500 01/03/13 0900 01/03/13 1004  BP: 161/83 169/87 134/58   Pulse: 112 118 115 110  Temp: 99.9 F (37.7 C)  102.8 F (39.3 C)   TempSrc: Oral  Oral   Resp: 22 27 28    Height:      Weight:  134 lb 7.7 oz (61 kg)    SpO2: 93% 94% 94% 91%   Weight change:   Intake/Output Summary (Last 24 hours) at 01/03/13 1009 Last data filed at 01/03/13 0619  Gross per 24 hour  Intake   1700 ml  Output    800 ml  Net    900 ml   Vitals reviewed. General: laying in bed, sleepy, dozing off during examination HEENT: PERRL, EOMI, no scleral icterus Cardiac: tachycardic to 110, no rubs, murmurs or gallops Pulm: normal respiratory effort, scattered rhonchi bilateral lung fields.  Abd: soft, nontender, nondistended, BS present Ext: warm and well perfused, no pedal edema Neuro:  MS: Somnolent CN: Cranial nerves are intact but marked weakness of facial muscles Motor: 2/5 strength of leg flexion/extension bilaterally, 3-4/5 hip flexion/extension bilaterally, 4/5 strength arm flexion/extension bilaterally, very weak grip bilaterally Sensation: Patient reports intact sensation to fine touch in extremities and face Reflexes: absent patellar, achilles, brachioradialis. Toes appear downgoing Cerebellar: difficult to assess in patient's debilitated state, limited participation.   Lab Results: Basic Metabolic Panel:  Lab  99991111 0445 01/03/13 0036  NA 135 139  K 3.3* 3.6  CL 104 110  CO2 20 21  GLUCOSE 161* 170*  BUN 11 15  CREATININE 0.53 0.55  CALCIUM 7.9* 7.7*  MG -- --  PHOS -- --   Liver Function Tests:  Lab 01/02/13 0733 12/30/12 0943  AST 19 22  ALT 12 15  ALKPHOS 70 84  BILITOT 0.4 0.6  PROT 6.6 7.6  ALBUMIN 3.1* 3.7    Lab 12/30/12 0943  LIPASE 12  AMYLASE --   CBC:  Lab 01/02/13 0733 12/30/12 0943  WBC 6.4 5.1  NEUTROABS 4.8 4.4  HGB 14.0 13.8  HCT 40.4 38.6  MCV 85.8 83.7  PLT 212 251   Cardiac Enzymes:  Lab 01/02/13 0733 12/30/12 0943  CKTOTAL -- --  CKMB -- --  CKMBINDEX -- --  TROPONINI <0.30 <0.30   D-Dimer:  Lab 12/30/12 0943  DDIMER 0.28   CBG:  Lab 01/03/13 0832 01/03/13 0554 01/03/13 0225 01/03/13 0101 01/02/13 2353 01/02/13 2248  GLUCAP 229* 182* 123* 140* 210* 113*   Hemoglobin A1C:  Lab 01/02/13 1705  HGBA1C 15.8*   Urinalysis:  Lab 01/02/13 2259 12/30/12 1019  COLORURINE YELLOW YELLOW  LABSPEC 1.024 1.031*  PHURINE 5.0 5.5  GLUCOSEU >1000* >1000*  HGBUR TRACE* SMALL*  BILIRUBINUR NEGATIVE NEGATIVE  KETONESUR 40* >80*  PROTEINUR 30* 100*  UROBILINOGEN 0.2 0.2  NITRITE NEGATIVE NEGATIVE  LEUKOCYTESUR SMALL* NEGATIVE    Micro Results: Recent Results (from the past 240 hour(s))  MRSA PCR SCREENING  Status: Normal   Collection Time   01/02/13  5:44 PM      Component Value Range Status Comment   MRSA by PCR NEGATIVE  NEGATIVE Final    Studies/Results: Dg Chest 2 View  01/02/2013  *RADIOLOGY REPORT*  Clinical Data: Cough, diabetic ketoacidosis  CHEST - 2 VIEW  Comparison: Prior chest x-ray 12/30/2012  Findings: The lungs are well-aerated and free from pulmonary edema, focal airspace consolidation or pulmonary nodule.  Cardiac and mediastinal contours are within normal limits.  Query trace aortic atherosclerotic calcifications.  No pneumothorax, or pleural effusion. No acute osseous findings.  IMPRESSION:  No acute cardiopulmonary  disease, specifically no evidence of pneumonia.   Original Report Authenticated By: Jacqulynn Cadet, M.D.    Mr Lumbar Spine W Wo Contrast  01/02/2013  *RADIOLOGY REPORT*  Clinical Data: Bilateral lower extremity weakness.  Bowel and bladder incontinence.  MRI LUMBAR SPINE WITHOUT AND WITH CONTRAST  Technique:  Multiplanar and multiecho pulse sequences of the lumbar spine were obtained without and with intravenous contrast.  Contrast: 8mL MULTIHANCE GADOBENATE DIMEGLUMINE 529 MG/ML IV SOLN  Comparison: None  Findings: Examination is limited by patient motion.  The sagittal MR images demonstrate normal alignment of the lumbar vertebral bodies.  They demonstrate normal marrow signal.  The last full intervertebral disc space is labeled L5-S1.  The conus medullaris terminates at L1 and appears normal.  The facets are normally aligned.  No pars defects.  No significant paraspinal or retroperitoneal process is identified.  There is moderate distention of the bladder.  No focal disc protrusions, spinal or foraminal stenosis at L1-2, L2- 3, L3-4 and L4-5  L5-S1:  Mild disc desiccation and shallow central disc protrusion but no significant neural compression.  No foraminal stenosis.  IMPRESSION:  1. Mild disc desiccation and shallow central disc protrusion at L5- S1. 2. Normal appearance of the lower thoracic spinal cord. 3. Moderate distention of the bladder.   Original Report Authenticated By: Marijo Sanes, M.D.    Medications: I have reviewed the patient's current medications. Scheduled Meds:   . heparin  5,000 Units Subcutaneous Q8H  . insulin aspart  0-15 Units Subcutaneous TID WC  . insulin aspart  0-5 Units Subcutaneous QHS  . LORazepam  0.5 mg Intravenous Once  . magic mouthwash  5 mL Oral Daily  . oseltamivir  75 mg Oral BID  . potassium chloride  40 mEq Oral BID   Continuous Infusions:   . sodium chloride 100 mL/hr (01/03/13 0851)   PRN Meds:.acetaminophen, acetaminophen, dextrose, ondansetron  (ZOFRAN) IV, ondansetron Assessment/Plan:  1) DKA Resolved. Suspect influenza precipitated episode (see #2). AG 26 on admission, now 11. Insulin drip turned off last night, on SQ SSI. A1c increase from 7.2 in 2011 to 15.4 here. Will need better assessment of insulin requirement and likely uptitration of meds prior to discharge. - SSI  2) Influenza A/H1N1 PCR positive flu A and H1N1. No respiratory complaints on admission, no myalgias but did have chills. CXR on admission normal. Started tamiflu last night. Episode of hypotension today to 80s/50s and spiked T 102.8.  - Blood cultures - Stat CXR to eval any changes concerning for secondary bacterial PNA in setting of influenza. Low threshold to start therapy for CAP - Fluid bolus 500cc, will repeat if patient tolerates without any evidence of volume overload  3) Urinary tract infection Urine returned with 11-20 WBCs, nitrite is negative. No leukocytosis. Will treat with IV ceftriaxone, especially in setting of hypotension. Blood cultures pending.  -  Ceftriaxone 1g IV qd  4) Lower extremity and upper extremity weakness Prominent and worsening symmetric weakness of bilateral lower and upper extremities as well as face. Weakness more prominent in distal>proximal extremities. This afternoon, I could not ellicit patellar, achilles or brachioradialis reflexes from her. Much more somnolent today. Nurse also reports incontinence this am. Her symmetric ascending weakness is concerning for possible Guillain Barre syndrome, possibly related to viral infection (flu), although timeline does not completely fit. No current evidence of respiratory suppression on exam. MRI lumbar spine yesterday did not show any cord compression.  - Consult neuro, critical care in setting of acute worsening of weakness. May require LP, monitoring of respiratory status - Have ordered baseline neuropathy labs: TSH, RPR, ANA, B12, folate, ESR  Dispo: Disposition is deferred at this  time, awaiting improvement of current medical problems.  Anticipated discharge in approximately 5-6 day(s).   The patient does have a current PCP (REESE,BETTI D, MD), therefore will not be requiring OPC follow-up after discharge.   The patient does have transportation limitations that hinder transportation to clinic appointments.  .Services Needed at time of discharge: Y = Yes, Blank = No PT:   OT:   RN:   Equipment:   Other:     LOS: 1 day   Tonia Brooms 01/03/2013, 10:09 AM

## 2013-01-04 LAB — BASIC METABOLIC PANEL
BUN: 10 mg/dL (ref 6–23)
CO2: 21 mEq/L (ref 19–32)
Calcium: 7.4 mg/dL — ABNORMAL LOW (ref 8.4–10.5)
Chloride: 103 mEq/L (ref 96–112)
Creatinine, Ser: 0.54 mg/dL (ref 0.50–1.10)
GFR calc Af Amer: >90 mL/min (ref >90)
GFR calc non Af Amer: >90 mL/min (ref >90)
Glucose, Bld: 163 mg/dL — ABNORMAL HIGH (ref 70–99)
Potassium: 3.4 mEq/L — ABNORMAL LOW (ref 3.5–5.1)
Sodium: 134 mEq/L — ABNORMAL LOW (ref 135–145)

## 2013-01-04 LAB — CBC WITH DIFFERENTIAL/PLATELET
Basophils Absolute: 0 10*3/uL (ref 0.0–0.1)
Basophils Relative: 0 % (ref 0–1)
Eosinophils Absolute: 0 10*3/uL (ref 0.0–0.7)
Eosinophils Relative: 0 % (ref 0–5)
HCT: 34.9 % — ABNORMAL LOW (ref 36.0–46.0)
Hemoglobin: 12.3 g/dL (ref 12.0–15.0)
Lymphocytes Relative: 16 % (ref 12–46)
Lymphs Abs: 1.5 10*3/uL (ref 0.7–4.0)
MCH: 29.3 pg (ref 26.0–34.0)
MCHC: 35.2 g/dL (ref 30.0–36.0)
MCV: 83.1 fL (ref 78.0–100.0)
Monocytes Absolute: 0.5 10*3/uL (ref 0.1–1.0)
Monocytes Relative: 5 % (ref 3–12)
Neutro Abs: 7.6 10*3/uL (ref 1.7–7.7)
Neutrophils Relative %: 79 % — ABNORMAL HIGH (ref 43–77)
Platelets: 122 10*3/uL — ABNORMAL LOW (ref 150–400)
RBC: 4.2 MIL/uL (ref 3.87–5.11)
RDW: 12.5 % (ref 11.5–15.5)
WBC Morphology: INCREASED
WBC: 9.6 10*3/uL (ref 4.0–10.5)

## 2013-01-04 LAB — GLUCOSE, CAPILLARY
Glucose-Capillary: 159 mg/dL — ABNORMAL HIGH (ref 70–99)
Glucose-Capillary: 145 mg/dL — ABNORMAL HIGH (ref 70–99)
Glucose-Capillary: 180 mg/dL — ABNORMAL HIGH (ref 70–99)
Glucose-Capillary: 146 mg/dL — ABNORMAL HIGH (ref 70–99)

## 2013-01-04 LAB — ANA: Anti Nuclear Antibody(ANA): NEGATIVE

## 2013-01-04 MED ORDER — LIVING WELL WITH DIABETES BOOK
Freq: Once | Status: AC
  Administered 2013-01-04: 12:00:00
  Filled 2013-01-04: qty 1

## 2013-01-04 MED ORDER — INSULIN GLARGINE 100 UNIT/ML ~~LOC~~ SOLN
8.0000 [IU] | Freq: Every day | SUBCUTANEOUS | Status: DC
  Administered 2013-01-04 – 2013-01-05 (×2): 8 [IU] via SUBCUTANEOUS

## 2013-01-04 MED ORDER — IBUPROFEN 400 MG PO TABS
400.0000 mg | ORAL_TABLET | Freq: Once | ORAL | Status: AC
  Administered 2013-01-04: 400 mg via ORAL
  Filled 2013-01-04: qty 1

## 2013-01-04 MED ORDER — LEVOFLOXACIN 500 MG PO TABS
500.0000 mg | ORAL_TABLET | Freq: Every day | ORAL | Status: DC
  Administered 2013-01-04 – 2013-01-06 (×4): 500 mg via ORAL
  Filled 2013-01-04 (×3): qty 1

## 2013-01-04 NOTE — Progress Notes (Signed)
Internal Medicine Attending  Date: 01/04/2013  Patient name: Amanda [name] Medical record number: [id number] Date of birth: 10/04/1951 Age: 62 y.o. Gender: female  I saw and evaluated the patient on a.m. Rounds with house staff. I reviewed the resident's note by Dr. Sandy Salaam and I agree with the resident's findings and plans as documented in her note.

## 2013-01-04 NOTE — Care Management Note (Signed)
    Page 1 of 2   01/04/2013     6:07:19 PM   CARE MANAGEMENT NOTE 01/04/2013  Patient:  Amanda [name], Amanda [name]   Account Number:  [id number]  Date Initiated:  01/04/2013  Documentation initiated by:  GRAVES-BIGELOW,Baleigh Rennaker  Subjective/Objective Assessment:   Pt admitted with weakness and flu. Pt is form home with husband.     Action/Plan:   CM did speak to family for [facility] services choice. They chose [facility] for services. Unable to provide pt with PT services due to no inusrance. HHRN was orderd for help with exercises. DME rollator RW to be delivered to room when stable.   Anticipated DC Date:  01/07/2013   Anticipated DC Plan:  [place]  In-house referral  [place]  CM consult      [facility] Choice  HOME HEALTH   Choice offered to / List presented to:  C-1 Patient   DME arranged  Vassie Moselle      DME agency  [place] arranged  HH-1 RN  [place].   Status of service:  Completed, signed off Medicare Important Message given?   (If response is "NO", the following Medicare IM given date fields will be blank) Date Medicare IM given:   Date Additional Medicare IM given:    Discharge Disposition:  [place]  Per UR Regulation:  Reviewed for med. necessity/level of care/duration of stay  If discussed at [place] of Stay Meetings, dates discussed:    Comments:  01-04-13 Jacqlyn Krauss, RN,BSN [phone] CM did make referral with [facility] for services and dme will be delivered to room before d/c.

## 2013-01-04 NOTE — Progress Notes (Signed)
Physical Therapy Treatment Patient Details Name: Amanda [name] MRN: [id number] DOB: 11-21-1951 Today's Date: 01/04/2013 Time: [id number] PT Time Calculation (min): 25 min  PT Assessment / Plan / Recommendation Comments on Treatment Session  Pt admitted with flu, UTI, DKA and significant weakness. Pt profoundly different from evaluation yesterday able to perform bed mobility unassisted and walk in the hall min assist. Family- mother and sister present on arrival and spouse just left from visiting from his hospital room. Pt with bil LE strength 4-5/5 and trunk control today. Pt states she doesn't recall yesterday and that she is eager to improve. Will follow acutely to improve balance and function but feel pt should be able to discharge home at this point with HHPT and RW if unable to meet goals prior to discharge. Pt encouraged to mobilize over the weekend with nursing and continue HEP.     Follow Up Recommendations  Home health PT     Does the patient have the potential to tolerate intense rehabilitation     Barriers to Discharge        Equipment Recommendations  Rolling walker with 5" wheels    Recommendations for Other Services    Frequency     Plan Discharge plan needs to be updated;Frequency remains appropriate    Precautions / Restrictions Precautions Precautions: Fall   Pertinent Vitals/Pain Pt reports HA but improving after RN gave tylenol beginning of session 95% throughout activity on RA, HR 99    Mobility  Bed Mobility Bed Mobility: Rolling Right;Right Sidelying to Sit Rolling Right: 6: Modified independent (Device/Increase time);With rail Right Sidelying to Sit: 6: Modified independent (Device/Increase time);With rails;HOB flat Sitting - Scoot to Edge of Bed: 6: Modified independent (Device/Increase time) Transfers Transfers: Sit to Stand;Stand to Sit Sit to Stand: 4: Min guard;From bed Stand to Sit: To chair/3-in-1;With armrests Details for Transfer Assistance:  cueing for hand placement and sequence for safety Ambulation/Gait Ambulation/Gait Assistance: 4: Min assist Ambulation Distance (Feet): 100 Feet Assistive device: 1 person hand held assist Ambulation/Gait Assistance Details: Pt with unsteady gait with assist at pelvis to control balance with gait with 3 periods of LOB with assist to correct. Pt fatigued after 100' Gait Pattern: Step-through pattern;Decreased stride length;Scissoring;Narrow base of support Gait velocity: decreased Stairs: No (pt denied attempting due to fatigue)    Exercises General Exercises - Lower Extremity Long Arc Quad: AROM;Both;20 reps;Seated Hip ABduction/ADduction: AROM;Both;20 reps;Seated Hip Flexion/Marching: AROM;Both;20 reps;Seated   PT Diagnosis:    PT Problem List:   PT Treatment Interventions:     PT Goals Acute Rehab PT Goals PT Goal: Rolling Supine to Right Side - Progress: Met PT Goal: Rolling Supine to Left Side - Progress: Met PT Goal: Supine/Side to Sit - Progress: Met PT Goal: Sit at Edge Of Bed - Progress: Met Pt will go Sit to Supine/Side: with modified independence;with HOB 0 degrees PT Goal: Sit to Supine/Side - Progress: Updated due to goal met Pt will go Sit to Stand: with modified independence PT Goal: Sit to Stand - Progress: Updated due to goal met Pt will go Stand to Sit: with modified independence PT Goal: Stand to Sit - Progress: Updated due to goals met Pt will Ambulate: >150 feet;with modified independence;with least restrictive assistive device PT Goal: Ambulate - Progress: Goal set today Pt will Go Up / Down Stairs: 3-5 stairs;with supervision;with least restrictive assistive device PT Goal: Up/Down Stairs - Progress: Goal set today  Visit Information  Last PT Received On: 01/04/13 Assistance Needed: +  1    Subjective Data  Subjective: "I don't really remember yesterday"   Cognition  Overall Cognitive Status: Appears within functional limits for tasks  assessed/performed Arousal/Alertness: Awake/alert Orientation Level: Appears intact for tasks assessed Behavior During Session: [facility] for tasks performed    Balance  Static Sitting Balance Static Sitting - Balance Support: No upper extremity supported;Feet supported Static Sitting - Level of Assistance: 6: Modified independent (Device/Increase time) Static Sitting - Comment/# of Minutes: 2  End of Session PT - End of Session Equipment Utilized During Treatment: Gait belt Activity Tolerance: Patient tolerated treatment well Patient left: in chair;with call bell/phone within [name];with family/visitor present Nurse Communication: Mobility status   GP     Amanda [name] 01/04/2013, 3:23 PM Amanda [name], [place]

## 2013-01-04 NOTE — Progress Notes (Signed)
NEURO HOSPITALIST PROGRESS NOTE   SUBJECTIVE:                                                                                                                        Patient is very drowsy when I entered the room.  She will only awaken to sternal rub or noxious stimuli to nailbeds.  When this is administered she quickly arouses, pushes my hands away and withdraws both legs and arms.  She also states"stop doing that".  Nurse tells me she ate well this AM. I later checked back on patient and she is awake and in no distress. Patient continues to have fever between 101.4-99.1.  OBJECTIVE:                                                                                                                           Vital signs in last 24 hours: Temp:  [99 F (37.2 C)-101.5 F (38.6 C)] 99.1 F (37.3 C) (01/17 0801) Pulse Rate:  [86-116] 86  (01/17 1100) Resp:  [7-29] 17  (01/17 1100) BP: (100-164)/(53-95) 102/56 mmHg (01/17 1100) SpO2:  [91 %-100 %] 100 % (01/17 1100)  Intake/Output from previous day: 01/16 0701 - 01/17 0700 In: 2510 [P.O.:360; I.V.:2100; IV Piggyback:50] Out: 300 [Urine:300] Intake/Output this shift: Total I/O In: 640 [P.O.:240; I.V.:400] Out: -  Nutritional status: Carb Control  Past Medical History  Diagnosis Date  . Diabetes mellitus   . Hypotension     Neurologic ROS negative with exception of above. Musculoskeletal ROS weakness  Neurologic Exam:   Mental Status:  Drowsy but awakens briskly to pain. Speech minimal but fluent without evidence of aphasia. Able to follow 3 step commands without difficulty.  Cranial Nerves:  II: Visual fields grossly normal, pupils equal, round, reactive to light and accommodation  III,IV, VI: ptosis not present, extra-ocular motions intact bilaterally  V,VII: face symmetric, facial light touch sensation normal bilaterally  VIII: hearing normal bilaterally  IX,X: gag reflex present  XI:  bilateral shoulder shrug  XII: midline tongue extension  Motor:  Moving all extremities antigravity with 4+/5 strength and following commands intermittently.   Sensory: She has decreased sensation of bilateral distal LE and cannot Vibratory sensation at toes or ankles.  Deep Tendon Reflexes: 2+ and symmetric UE bilaterally, no KJ or AJ noted.  Plantars:  Mute bilaterally  Cerebellar:  normal finger-to-nose,  CV: pulses palpable throughout   Lab Results: Lab Results  Component Value Date/Time   CHOL  Value: 139        ATP III CLASSIFICATION:  <200     mg/dL   Desirable  200-239  mg/dL   Borderline High  >=240    mg/dL   High        03/27/2010  3:38 AM   Lipid Panel No results found for this basename: CHOL,TRIG,HDL,CHOLHDL,VLDL,LDLCALC in the last 72 hours  Studies/Results: Mr Cervical Spine Wo Contrast  01/03/2013  *RADIOLOGY REPORT*  Clinical Data: Lower extremity weakness.  Falls.  MRI CERVICAL SPINE WITHOUT CONTRAST  Technique:  Multiplanar and multiecho pulse sequences of the cervical spine, to include the craniocervical junction and cervicothoracic junction, were obtained according to standard protocol without intravenous contrast.  Comparison: None.  Findings: The craniocervical junction appears unremarkable.  Inversion recovery weighted images demonstrate no significant abnormal vertebral or periligamentous edema.  No significant vertebral subluxation.  No significant abnormal cord signal is identified.  Degenerative arthropathy at the sternoclavicular joints noted bilaterally  Additional findings at individual levels are as follows:  C2-3:  Unremarkable.  C3-4:  No impingement.  Mild bilateral facet arthropathy.  C4-5:  No impingement.  Mild bilateral facet arthropathy.  C5-6:  No impingement.  Mild left facet arthropathy.  C6-7:  Moderate left and mild right foraminal stenosis and mild central stenosis due to disc bulge, uncinate spurring, and facet arthropathy.  C7-T1:  Borderline  bilateral foraminal stenosis due to uncinate and facet spurring.  IMPRESSION:  1.  Cervical spondylosis and degenerative disc disease, contributing to moderate impingement at C6-7 and borderline impingement at C7-T1. 2.  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   Original Report Authenticated By: Van Clines, M.D.    Mr Lumbar Spine W Wo Contrast  01/02/2013  *RADIOLOGY REPORT*  Clinical Data: Bilateral lower extremity weakness.  Bowel and bladder incontinence.  MRI LUMBAR SPINE WITHOUT AND WITH CONTRAST  Technique:  Multiplanar and multiecho pulse sequences of the lumbar spine were obtained without and with intravenous contrast.  Contrast: 58mL MULTIHANCE GADOBENATE DIMEGLUMINE 529 MG/ML IV SOLN  Comparison: None  Findings: Examination is limited by patient motion.  The sagittal MR images demonstrate normal alignment of the lumbar vertebral bodies.  They demonstrate normal marrow signal.  The last full intervertebral disc space is labeled L5-S1.  The conus medullaris terminates at L1 and appears normal.  The facets are normally aligned.  No pars defects.  No significant paraspinal or retroperitoneal process is identified.  There is moderate distention of the bladder.  No focal disc protrusions, spinal or foraminal stenosis at L1-2, L2- 3, L3-4 and L4-5  L5-S1:  Mild disc desiccation and shallow central disc protrusion but no significant neural compression.  No foraminal stenosis.  IMPRESSION:  1. Mild disc desiccation and shallow central disc protrusion at L5- S1. 2. Normal appearance of the lower thoracic spinal cord. 3. Moderate distention of the bladder.   Original Report Authenticated By: Marijo Sanes, M.D.    Dg Chest Port 1 View  01/03/2013  *RADIOLOGY REPORT*  Clinical Data: Hypotension.  Flu.  PORTABLE CHEST - 1 VIEW  Comparison: 01/02/2013  Findings: Mild peribronchial thickening.  No confluent airspace opacities.  No effusions.  Heart is upper limits normal in size.  No acute bony abnormality.  IMPRESSION: Peribronchial  thickening.   Original Report Authenticated By: Rolm Baptise, M.D.     MEDICATIONS                                                                                                                        Scheduled:   . heparin  5,000 Units Subcutaneous Q8H  . insulin aspart  0-15 Units Subcutaneous TID WC  . insulin aspart  0-5 Units Subcutaneous QHS  . insulin glargine  8 Units Subcutaneous QHS  . levofloxacin  500 mg Oral Daily  . living well with diabetes book   Does not apply Once  . LORazepam  0.5 mg Intravenous Once  . magic mouthwash  5 mL Oral Daily  . oseltamivir  75 mg Oral BID  . potassium chloride  40 mEq Oral BID    ASSESSMENT/PLAN:                                                                                                               Patient Active Hospital Problem List:  Generalized weakness (01/02/2013)   Assessment: Patient continues to show good bilateral UE and LE strength.  MRI C-spine shows no sever stenosis or cord impingement to explain patients LE weakness. 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  Etiology LE weakness likely multifactorial including uncontrolled DM, elevated temperature and UTI.     Recommend: 1) Continue PT/OT 2) Continue to treat influenza and UTI 3) Continue to control blood glucose  No further Neurology recommendations.   I have discussed case with Dr. Armida Sans  Neurology will S/O    Etta Quill PA-C [facility] [phone]  01/04/2013, 12:04 PM

## 2013-01-04 NOTE — Progress Notes (Signed)
Subjective: Patient would not awake for my exam early this morning but upon returning for lunch she was sitting upright, leaning forward, moving extremities, saying feels better. Neuro workup not concerning for GB or cord compression.  Denies CP, SOB, N/V, dizziness, abdominal pain, diarrhea.  Objective: Vital signs in last 24 hours: Filed Vitals:   01/04/13 0900 01/04/13 1000 01/04/13 1100 01/04/13 1200  BP: 122/95 125/64 102/56 116/66  Pulse: 93 94 86 92  Temp:    97.7 F (36.5 C)  TempSrc:    Oral  Resp: 14 14 17 23   Height:      Weight:      SpO2: 100% 100% 100% 100%   Weight change:   Intake/Output Summary (Last 24 hours) at 01/04/13 1313 Last data filed at 01/04/13 1100  Gross per 24 hour  Intake   2630 ml  Output    300 ml  Net   2330 ml   Vitals reviewed. General: sitting up in bed, smiling HEENT:MMM of OP, EOMI, no scleral icterus Cardiac: RRR, no rubs, murmurs or gallops Pulm: good air movement, some upper airway rhonchi Abd: soft, slightly tender to palpation, nondistended, BS present Ext: warm and well perfused, no pedal edema Neuro: alert and oriented X3, cranial nerves II-XII grossly intact, strength 4/5 LE, 5/5 UE this morning. Unable to elicit patellar or achilles reflexes.   Lab Results: Basic Metabolic Panel:  Lab [id number] 0500 01/03/13 0445  NA 134* 135  K 3.4* 3.3*  CL 103 104  CO2 21 20  GLUCOSE 163* 161*  BUN 10 11  CREATININE 0.54 0.53  CALCIUM 7.4* 7.9*  MG -- --  PHOS -- --   Liver Function Tests:  Lab 01/02/13 0733 12/30/12 0943  AST 19 22  ALT 12 15  ALKPHOS 70 84  BILITOT 0.4 0.6  PROT 6.6 7.6  ALBUMIN 3.1* 3.7    Lab 12/30/12 0943  LIPASE 12  AMYLASE --   No results found for this basename: AMMONIA:2 in the last 168 hours CBC:  Lab 01/04/13 0500 01/03/13 1250 01/02/13 0733  WBC 9.6 9.0 --  NEUTROABS 7.6 -- 4.8  HGB 12.3 12.1 --  HCT 34.9* 33.5* --  MCV 83.1 82.7 --  PLT 122* 145* --   Cardiac Enzymes:  Lab  01/02/13 0733 12/30/12 0943  CKTOTAL -- --  CKMB -- --  CKMBINDEX -- --  TROPONINI <0.30 <0.30   BNP: No results found for this basename: PROBNP:3 in the last 168 hours D-Dimer:  Lab 12/30/12 0943  DDIMER 0.28   CBG:  Lab 01/04/13 1224 01/04/13 0800 01/03/13 2150 01/03/13 1554 01/03/13 1226 01/03/13 0832  GLUCAP 145* 159* 145* 130* 137* 229*   Hemoglobin A1C:  Lab 01/02/13 1705  HGBA1C 15.8*   Thyroid Function Tests:  Lab 01/03/13 1530  TSH 0.760  T4TOTAL --  FREET4 --  T3FREE --  THYROIDAB --  \Anemia Panel:  Lab 01/03/13 1530  VITAMINB12 1279*  FOLATE 8.2  FERRITIN --  TIBC --  IRON --  RETICCTPCT --   Urine Drug Screen: Drugs of Abuse     Component Value Date/Time   LABOPIA NONE DETECTED 04/11/2010 0214   COCAINSCRNUR NONE DETECTED 04/11/2010 0214   LABBENZ NONE DETECTED 04/11/2010 0214   AMPHETMU NONE DETECTED 04/11/2010 0214   THCU NONE DETECTED 04/11/2010 0214   LABBARB  Value: NONE DETECTED        DRUG SCREEN FOR MEDICAL PURPOSES ONLY.  IF CONFIRMATION IS NEEDED FOR ANY PURPOSE, NOTIFY  LAB WITHIN 5 DAYS.        LOWEST DETECTABLE LIMITS FOR URINE DRUG SCREEN Drug Class       Cutoff (ng/mL) Amphetamine      1000 Barbiturate      200 Benzodiazepine   [id number] Tricyclics       [id number] Opiates          300 Cocaine          300 THC              50 04/11/2010 0214    Alcohol Level: No results found for this basename: ETH:2 in the last 168 hours Urinalysis:  Lab 01/02/13 2259 12/30/12 1019  COLORURINE YELLOW YELLOW  LABSPEC 1.024 1.031*  PHURINE 5.0 5.5  GLUCOSEU >1000* >1000*  HGBUR TRACE* SMALL*  BILIRUBINUR NEGATIVE NEGATIVE  KETONESUR 40* >80*  PROTEINUR 30* 100*  UROBILINOGEN 0.2 0.2  NITRITE NEGATIVE NEGATIVE  LEUKOCYTESUR SMALL* NEGATIVE   Micro Results: Recent Results (from the past 240 hour(s))  MRSA PCR SCREENING     Status: Normal   Collection Time   01/02/13  5:44 PM      Component Value Range Status Comment   MRSA by PCR NEGATIVE  NEGATIVE Final    URINE CULTURE     Status: Normal (Preliminary result)   Collection Time   01/02/13 10:59 PM      Component Value Range Status Comment   Specimen Description URINE, CLEAN CATCH   Final    Special Requests NONE   Final    Culture  Setup Time 01/02/2013 23:24   Final    Colony Count PENDING   Incomplete    Culture Culture reincubated for better growth   Final    Report Status PENDING   Incomplete   CULTURE, BLOOD (ROUTINE X 2)     Status: Normal (Preliminary result)   Collection Time   01/03/13 12:30 PM      Component Value Range Status Comment   Specimen Description BLOOD HAND RIGHT   Final    Special Requests BOTTLES DRAWN AEROBIC ONLY 10CC   Final    Culture  Setup Time 01/03/2013 22:12   Final    Culture     Final    Value:        BLOOD CULTURE RECEIVED NO GROWTH TO DATE CULTURE WILL BE HELD FOR 5 DAYS BEFORE ISSUING A FINAL NEGATIVE REPORT   Report Status PENDING   Incomplete   CULTURE, BLOOD (ROUTINE X 2)     Status: Normal (Preliminary result)   Collection Time   01/03/13 12:39 PM      Component Value Range Status Comment   Specimen Description BLOOD HAND LEFT   Final    Special Requests BOTTLES DRAWN AEROBIC ONLY 10CC   Final    Culture  Setup Time 01/03/2013 22:12   Final    Culture     Final    Value:        BLOOD CULTURE RECEIVED NO GROWTH TO DATE CULTURE WILL BE HELD FOR 5 DAYS BEFORE ISSUING A FINAL NEGATIVE REPORT   Report Status PENDING   Incomplete    Studies/Results: Mr Cervical Spine Wo Contrast  01/03/2013  *RADIOLOGY REPORT*  Clinical Data: Lower extremity weakness.  Falls.  MRI CERVICAL SPINE WITHOUT CONTRAST  Technique:  Multiplanar and multiecho pulse sequences of the cervical spine, to include the craniocervical junction and cervicothoracic junction, were obtained according to standard protocol without intravenous contrast.  Comparison: None.  Findings:  The craniocervical junction appears unremarkable.  Inversion recovery weighted images demonstrate no significant  abnormal vertebral or periligamentous edema.  No significant vertebral subluxation.  No significant abnormal cord signal is identified.  Degenerative arthropathy at the sternoclavicular joints noted bilaterally  Additional findings at individual levels are as follows:  C2-3:  Unremarkable.  C3-4:  No impingement.  Mild bilateral facet arthropathy.  C4-5:  No impingement.  Mild bilateral facet arthropathy.  C5-6:  No impingement.  Mild left facet arthropathy.  C6-7:  Moderate left and mild right foraminal stenosis and mild central stenosis due to disc bulge, uncinate spurring, and facet arthropathy.  C7-T1:  Borderline bilateral foraminal stenosis due to uncinate and facet spurring.  IMPRESSION:  1.  Cervical spondylosis and degenerative disc disease, contributing to moderate impingement at C6-7 and borderline impingement at C7-T1. 2.  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   Original Report Authenticated By: Van Clines, M.D.    Mr Lumbar Spine W Wo Contrast  01/02/2013  *RADIOLOGY REPORT*  Clinical Data: Bilateral lower extremity weakness.  Bowel and bladder incontinence.  MRI LUMBAR SPINE WITHOUT AND WITH CONTRAST  Technique:  Multiplanar and multiecho pulse sequences of the lumbar spine were obtained without and with intravenous contrast.  Contrast: 31mL MULTIHANCE GADOBENATE DIMEGLUMINE 529 MG/ML IV SOLN  Comparison: None  Findings: Examination is limited by patient motion.  The sagittal MR images demonstrate normal alignment of the lumbar vertebral bodies.  They demonstrate normal marrow signal.  The last full intervertebral disc space is labeled L5-S1.  The conus medullaris terminates at L1 and appears normal.  The facets are normally aligned.  No pars defects.  No significant paraspinal or retroperitoneal process is identified.  There is moderate distention of the bladder.  No focal disc protrusions, spinal or foraminal stenosis at L1-2, L2- 3, L3-4 and L4-5  L5-S1:   Mild disc desiccation and shallow central disc protrusion but no significant neural compression.  No foraminal stenosis.  IMPRESSION:  1. Mild disc desiccation and shallow central disc protrusion at L5- S1. 2. Normal appearance of the lower thoracic spinal cord. 3. Moderate distention of the bladder.   Original Report Authenticated By: Marijo Sanes, M.D.    Dg Chest Port 1 View  01/03/2013  *RADIOLOGY REPORT*  Clinical Data: Hypotension.  Flu.  PORTABLE CHEST - 1 VIEW  Comparison: 01/02/2013  Findings: Mild peribronchial thickening.  No confluent airspace opacities.  No effusions.  Heart is upper limits normal in size. No acute bony abnormality.  IMPRESSION: Peribronchial thickening.   Original Report Authenticated By: Rolm Baptise, M.D.    Medications: I have reviewed the patient's current medications. Scheduled Meds:   . heparin  5,000 Units Subcutaneous Q8H  . insulin aspart  0-15 Units Subcutaneous TID WC  . insulin aspart  0-5 Units Subcutaneous QHS  . insulin glargine  8 Units Subcutaneous QHS  . levofloxacin  500 mg Oral Daily  . living well with diabetes book   Does not apply Once  . LORazepam  0.5 mg Intravenous Once  . magic mouthwash  5 mL Oral Daily  . oseltamivir  75 mg Oral BID  . potassium chloride  40 mEq Oral BID   Continuous Infusions:   . sodium chloride 1,000 mL (01/04/13 0612)   PRN Meds:.acetaminophen, acetaminophen, dextrose, ondansetron (ZOFRAN) IV, ondansetron Assessment/Plan: 1) DKA  Resolved. Suspect influenza precipitated episode (see #2). AG 26 on admission, now 10. On on SQ SSI. Only required 7U sliding scale aspart coverage total yesterday,  and no basal insulin given in past 36 hours.  A1c increase from 7.2 in 2011 to 15.4 here. Such a high A1c seems out of proportion to blood glucoses here, which are in low to mid 100s. Suspect noncompliance with insulin at home. Will start patient on 7U of lantus tonight, will continue on discharge.    2) Influenza A/H1N1    PCR positive flu A and H1N1. No respiratory complaints on admission, no myalgias but did have chills. CXR normal x2, leukocytosis. Has spiked some fevers, most recently 101.4 last night. Cultures NGTD. Blood pressures stable.  Continues on tamiflu.  - Continue tamiflu.  - Adding levaquin for UTI (see #3), this should cover for CAP as well.   3) Urinary tract infection  Urine returned with 11-20 WBCs, nitrite is negative. Culture pending. Received IV Rocephin yesterday, transitioning to levaquin today.  - Levaquin 500mg  po qd  4) Lower extremity and upper extremity weakness  Has weakness that appears to come and go throughout the day. MRI of cervical and lumbar spine unrevealing for cause of weakness. Seen by neuro, no concern for Guillain Barre or need for LP. PCCM saw and noted good vital capacity as demonstrated by NIF. This morning on rounds patient moving all extremities against gravity and able to sit up without assistance. 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 PT will see her again today.  - PT/OT  Dispo: Disposition is deferred at this time, awaiting improvement of current medical problems.  Anticipated discharge in approximately 1-2 day(s).   The patient does have a current PCP (REESE,BETTI D, MD), therefore will not be requiring OPC follow-up after discharge.   The patient does not have transportation limitations that hinder transportation to clinic appointments.  .Services Needed at time of discharge: Y = Yes, Blank = No PT:   OT:   RN:   Equipment:   Other:     LOS: 2 days   Tonia Brooms 01/04/2013, 1:13 PM

## 2013-01-04 NOTE — Progress Notes (Signed)
Patient's temp 101.4, (tylenol given earlier in shift 2019), blood pressure 119/57, SR 95, O2 sat 98% on RA. Patient drowsy, easy to arouse, oriented times 4. Patient denies having pain or respiratory distress. Physician notified of temp and no new orders given.

## 2013-01-05 LAB — GLUCOSE, CAPILLARY
Glucose-Capillary: 226 mg/dL — ABNORMAL HIGH (ref 70–99)
Glucose-Capillary: 122 mg/dL — ABNORMAL HIGH (ref 70–99)

## 2013-01-05 LAB — BASIC METABOLIC PANEL
BUN: 10 mg/dL (ref 6–23)
CO2: 19 mEq/L (ref 19–32)
Calcium: 7.3 mg/dL — ABNORMAL LOW (ref 8.4–10.5)
Chloride: 107 mEq/L (ref 96–112)
Creatinine, Ser: 0.52 mg/dL (ref 0.50–1.10)
GFR calc Af Amer: >90 mL/min (ref >90)
GFR calc non Af Amer: >90 mL/min (ref >90)
Glucose, Bld: 196 mg/dL — ABNORMAL HIGH (ref 70–99)
Potassium: 4 mEq/L (ref 3.5–5.1)
Sodium: 135 mEq/L (ref 135–145)

## 2013-01-05 LAB — CBC WITH DIFFERENTIAL/PLATELET
Basophils Absolute: 0 10*3/uL (ref 0.0–0.1)
Basophils Relative: 0 % (ref 0–1)
Eosinophils Absolute: 0 10*3/uL (ref 0.0–0.7)
Eosinophils Relative: 0 % (ref 0–5)
HCT: 29.9 % — ABNORMAL LOW (ref 36.0–46.0)
Hemoglobin: 10.6 g/dL — ABNORMAL LOW (ref 12.0–15.0)
Lymphocytes Relative: 22 % (ref 12–46)
Lymphs Abs: 1.8 10*3/uL (ref 0.7–4.0)
MCH: 29.7 pg (ref 26.0–34.0)
MCHC: 35.5 g/dL (ref 30.0–36.0)
MCV: 83.8 fL (ref 78.0–100.0)
Monocytes Absolute: 0.4 10*3/uL (ref 0.1–1.0)
Monocytes Relative: 5 % (ref 3–12)
Neutro Abs: 5.8 10*3/uL (ref 1.7–7.7)
Neutrophils Relative %: 73 % (ref 43–77)
Platelets: 119 10*3/uL — ABNORMAL LOW (ref 150–400)
RBC: 3.57 MIL/uL — ABNORMAL LOW (ref 3.87–5.11)
RDW: 12.6 % (ref 11.5–15.5)
WBC: 8 10*3/uL (ref 4.0–10.5)

## 2013-01-05 LAB — URINE CULTURE: Colony Count: >=100000

## 2013-01-05 MED ORDER — OSELTAMIVIR PHOSPHATE 75 MG PO CAPS: 75.0000 mg | ORAL_CAPSULE | Freq: Two times a day (BID) | ORAL | Status: DC

## 2013-01-05 MED ORDER — RELION PRIME MONITOR DEVI: 1.0000 | Freq: Three times a day (TID) | Status: DC | PRN

## 2013-01-05 MED ORDER — ACETAMINOPHEN 325 MG PO TABS: 650.0000 mg | ORAL_TABLET | Freq: Four times a day (QID) | ORAL | Status: AC | PRN

## 2013-01-05 MED ORDER — GLUCOSE BLOOD VI STRP: ORAL_STRIP | Status: DC

## 2013-01-05 MED ORDER — LEVOFLOXACIN 500 MG PO TABS: 500.0000 mg | ORAL_TABLET | Freq: Every day | ORAL | Status: DC

## 2013-01-05 MED ORDER — INSULIN GLARGINE 100 UNIT/ML ~~LOC~~ SOLN: 8.0000 [IU] | Freq: Every day | SUBCUTANEOUS | Status: DC

## 2013-01-05 NOTE — Progress Notes (Signed)
Pt. In high spirits.  Ambulating in room without need of assistance.  Requesting to use shower, but physician's order not in place.  States, ' I'm glad I was able to stay another night......[name] I needed the rest.'

## 2013-01-05 NOTE — Progress Notes (Signed)
Patient's sister came to me, demanding to know why we were sending her sister home when she was obviously so debilitated in her eyes.  I attempted to calm her by telling her that she had made remarkable strides since her admission and was now in a stable enough condition that the treatment team felt that she would benefit from a home environment with [facility] support.  This did not satisfy the sister, who continued to hurl verbal assaults at the staff, stating we didn't know what we were doing and she would write to whoever she had to to make sure that everyone knew how inadequate we were.  She paced the hallway and we were unable to calm her.  The patient's only concern was that she felt she needed one more day of "observation" to be sure she would be "100%" when she went home.  Plan is to notify MD and have Dr. Marinda Elk see Pt and sisters prior to discharge and come up with a contingency plan which would benefit all parties.   See F/U notes by MD

## 2013-01-05 NOTE — Progress Notes (Signed)
Internal Medicine Attending  Date: 01/05/2013  Patient name: Amanda [name] Medical record number: [id number] Date of birth: 12-Feb-1951 Age: 62 y.o. Gender: female  I saw and evaluated the patient, and discussed her care with house staff. She is afebrile on Tamiflu and oral Levofloxacin.  Her room air oxygen saturations are 95% at rest.  Her strength continues to improve.  Exam is notable for bibasilar crackles, otherwise clear lungs; heart regular, no extra sounds or murmurs; abdomen soft and nontender with positive bowel sounds; no edema; motor 5 over 5.  The initial plan was to discharge patient home today; however patient and family are concerned about her mobility.  Plan is to have physical therapy see today to ambulate; check oxygen saturations with ambulation; continue current antibiotics.  If she does well, anticipate discharge home tomorrow.

## 2013-01-05 NOTE — Progress Notes (Signed)
Pt ambulated with 4WRW with SBA without difficulty in hall for approx. 250 ft.   Oxygen sats were 97-98% on R/A consistently.  Pt. Denied SOB.

## 2013-01-05 NOTE — Progress Notes (Signed)
Subjective: No acute events overnight, feeling better, eating breakfast, anticipating d/c home today, no family present but states that she has transportation home  Objective: Vital signs in last 24 hours: Filed Vitals:   01/04/13 1420 01/04/13 2051 01/05/13 0517 01/05/13 0541  BP:  101/64 157/95 142/85  Pulse: 99 98 97   Temp:  98.8 F (37.1 C) 99.6 F (37.6 C)   TempSrc:  Oral Oral   Resp:  15 15   Height:      Weight:   141 lb 8 oz (64.184 kg)   SpO2: 95% 95% 95%    Weight change:   Intake/Output Summary (Last 24 hours) at 01/05/13 0912 Last data filed at 01/05/13 0600  Gross per 24 hour  Intake   1980 ml  Output    500 ml  Net   1480 ml   Physical Exam General: Well-developed, well-nourished, in no acute distress; resting in bed, breakfast tray present Head: Normocephalic, atraumatic, hair bonnet in place Lungs: Normal respiratory effort. Clear to auscultation bilaterally from apices to bases without crackles or wheezes appreciated. Heart: normal rate, regular rhythm, normal S1 and S2, no gallop, murmur, or rubs appreciated. Extremities: No pretibial edema, distal pulses intact Neurologic: grossly non-focal, alert and oriented x3, appropriate and cooperative throughout examination.     Lab Results: Basic Metabolic Panel:  Lab [id number] 0515 01/04/13 0500  NA 135 134*  K 4.0 3.4*  CL 107 103  CO2 19 21  GLUCOSE 196* 163*  BUN 10 10  CREATININE 0.52 0.54  CALCIUM 7.3* 7.4*  MG -- --  PHOS -- --   Liver Function Tests:  Lab 01/02/13 0733 12/30/12 0943  AST 19 22  ALT 12 15  ALKPHOS 70 84  BILITOT 0.4 0.6  PROT 6.6 7.6  ALBUMIN 3.1* 3.7    Lab 12/30/12 0943  LIPASE 12  AMYLASE --   No results found for this basename: AMMONIA:2 in the last 168 hours CBC:  Lab 01/05/13 0515 01/04/13 0500  WBC 8.0 9.6  NEUTROABS 5.8 7.6  HGB 10.6* 12.3  HCT 29.9* 34.9*  MCV 83.8 83.1  PLT 119* 122*   Cardiac Enzymes:  Lab 01/02/13 0733 12/30/12 0943    CKTOTAL -- --  CKMB -- --  CKMBINDEX -- --  TROPONINI <0.30 <0.30   BNP: No results found for this basename: PROBNP:3 in the last 168 hours D-Dimer:  Lab 12/30/12 0943  DDIMER 0.28   CBG:  Lab 01/04/13 2057 01/04/13 1638 01/04/13 1224 01/04/13 0800 01/03/13 2150 01/03/13 1554  GLUCAP 146* 180* 145* 159* 145* 130*   Hemoglobin A1C:  Lab 01/02/13 1705  HGBA1C 15.8*   Thyroid Function Tests:  Lab 01/03/13 1530  TSH 0.760  T4TOTAL --  FREET4 --  T3FREE --  Benjaman Kindler --  \Anemia Panel:  Lab 01/03/13 1530  VITAMINB12 1279*  FOLATE 8.2  FERRITIN --  TIBC --  IRON --  RETICCTPCT --   Urine Drug Screen: Drugs of Abuse     Component Value Date/Time   LABOPIA NONE DETECTED 04/11/2010 0214   COCAINSCRNUR NONE DETECTED 04/11/2010 0214   LABBENZ NONE DETECTED 04/11/2010 0214   AMPHETMU NONE DETECTED 04/11/2010 0214   THCU NONE DETECTED 04/11/2010 0214   LABBARB  Value: NONE DETECTED        DRUG SCREEN FOR MEDICAL PURPOSES ONLY.  IF CONFIRMATION IS NEEDED FOR ANY PURPOSE, NOTIFY LAB WITHIN 5 DAYS.        LOWEST DETECTABLE LIMITS FOR URINE DRUG  SCREEN Drug Class       Cutoff (ng/mL) Amphetamine      1000 Barbiturate      200 Benzodiazepine   [id number] Tricyclics       [id number] Opiates          300 Cocaine          300 THC              50 04/11/2010 0214    Alcohol Level: No results found for this basename: ETH:2 in the last 168 hours Urinalysis:  Lab 01/02/13 2259 12/30/12 1019  COLORURINE YELLOW YELLOW  LABSPEC 1.024 1.031*  PHURINE 5.0 5.5  GLUCOSEU >1000* >1000*  HGBUR TRACE* SMALL*  BILIRUBINUR NEGATIVE NEGATIVE  KETONESUR 40* >80*  PROTEINUR 30* 100*  UROBILINOGEN 0.2 0.2  NITRITE NEGATIVE NEGATIVE  LEUKOCYTESUR SMALL* NEGATIVE   Micro Results: Recent Results (from the past 240 hour(s))  MRSA PCR SCREENING     Status: Normal   Collection Time   01/02/13  5:44 PM      Component Value Range Status Comment   MRSA by PCR NEGATIVE  NEGATIVE Final   URINE CULTURE     Status:  Normal (Preliminary result)   Collection Time   01/02/13 10:59 PM      Component Value Range Status Comment   Specimen Description URINE, CLEAN CATCH   Final    Special Requests NONE   Final    Culture  Setup Time 01/02/2013 23:24   Final    Colony Count PENDING   Incomplete    Culture Culture reincubated for better growth   Final    Report Status PENDING   Incomplete   CULTURE, BLOOD (ROUTINE X 2)     Status: Normal (Preliminary result)   Collection Time   01/03/13 12:30 PM      Component Value Range Status Comment   Specimen Description BLOOD HAND RIGHT   Final    Special Requests BOTTLES DRAWN AEROBIC ONLY 10CC   Final    Culture  Setup Time 01/03/2013 22:12   Final    Culture     Final    Value:        BLOOD CULTURE RECEIVED NO GROWTH TO DATE CULTURE WILL BE HELD FOR 5 DAYS BEFORE ISSUING A FINAL NEGATIVE REPORT   Report Status PENDING   Incomplete   CULTURE, BLOOD (ROUTINE X 2)     Status: Normal (Preliminary result)   Collection Time   01/03/13 12:39 PM      Component Value Range Status Comment   Specimen Description BLOOD HAND LEFT   Final    Special Requests BOTTLES DRAWN AEROBIC ONLY 10CC   Final    Culture  Setup Time 01/03/2013 22:12   Final    Culture     Final    Value:        BLOOD CULTURE RECEIVED NO GROWTH TO DATE CULTURE WILL BE HELD FOR 5 DAYS BEFORE ISSUING A FINAL NEGATIVE REPORT   Report Status PENDING   Incomplete    Studies/Results: Mr Cervical Spine Wo Contrast  01/03/2013  *RADIOLOGY REPORT*  Clinical Data: Lower extremity weakness.  Falls.  MRI CERVICAL SPINE WITHOUT CONTRAST  Technique:  Multiplanar and multiecho pulse sequences of the cervical spine, to include the craniocervical junction and cervicothoracic junction, were obtained according to standard protocol without intravenous contrast.  Comparison: None.  Findings: The craniocervical junction appears unremarkable.  Inversion recovery weighted images demonstrate no significant abnormal vertebral or  periligamentous edema.  No significant vertebral subluxation.  No significant abnormal cord signal is identified.  Degenerative arthropathy at the sternoclavicular joints noted bilaterally  Additional findings at individual levels are as follows:  C2-3:  Unremarkable.  C3-4:  No impingement.  Mild bilateral facet arthropathy.  C4-5:  No impingement.  Mild bilateral facet arthropathy.  C5-6:  No impingement.  Mild left facet arthropathy.  C6-7:  Moderate left and mild right foraminal stenosis and mild central stenosis due to disc bulge, uncinate spurring, and facet arthropathy.  C7-T1:  Borderline bilateral foraminal stenosis due to uncinate and facet spurring.  IMPRESSION:  1.  Cervical spondylosis and degenerative disc disease, contributing to moderate impingement at C6-7 and borderline impingement at C7-T1. 2.  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   Original Report Authenticated By: Van Clines, M.D.    Dg Chest Port 1 View  01/03/2013  *RADIOLOGY REPORT*  Clinical Data: Hypotension.  Flu.  PORTABLE CHEST - 1 VIEW  Comparison: 01/02/2013  Findings: Mild peribronchial thickening.  No confluent airspace opacities.  No effusions.  Heart is upper limits normal in size. No acute bony abnormality.  IMPRESSION: Peribronchial thickening.   Original Report Authenticated By: Rolm Baptise, M.D.    Medications: I have reviewed the patient's current medications. Scheduled Meds:    . heparin  5,000 Units Subcutaneous Q8H  . insulin aspart  0-15 Units Subcutaneous TID WC  . insulin aspart  0-5 Units Subcutaneous QHS  . insulin glargine  8 Units Subcutaneous QHS  . levofloxacin  500 mg Oral Daily  . LORazepam  0.5 mg Intravenous Once  . magic mouthwash  5 mL Oral Daily  . oseltamivir  75 mg Oral BID  . potassium chloride  40 mEq Oral BID   Continuous Infusions:    . sodium chloride 100 mL/hr at 01/05/13 0600   PRN Meds:.acetaminophen, acetaminophen, dextrose,  ondansetron (ZOFRAN) IV, ondansetron Assessment/Plan: 1) DMType2 with Diabetic Ketoacidosis  DKA Resolved; cbgs stable -plan to continue home dose Lantus 8 Units qhs with prescription for glucometer and test strips, DME recommending [employer] Prime meter and strips from [facility] which are less costly (~$16.24, $9 for #50 strips) -f/u with PCP for further Diabetic Education  CBG (last 3)   Basename 01/04/13 2057 01/04/13 1638 01/04/13 1224  GLUCAP 146* 180* 145*      2) Influenza A/H1N1  Afebrile, no leukocytosis, Day #4 of % day course of tamiflu, No respiratory complaints since admission, no myalgias, CXR w/o infiltrates, Blood cultures negative  - Continue tamiflu as outpt   3) Urinary tract infection  Urine returned with 11-20 WBCs, nitrite is negative. Culture without growth thus far. Day #2 of 7 of Levaquin - cont Levaquin 500mg  po qd as outpt for course of 7 days  4) Lower extremity and upper extremity weakness  appears to be more of a generalized weakness, which has greatly improved since admission. MRI of cervical and lumbar spine unrevealing for neurologic etiology, Evaluated by Neurology and Critical Care, no concern for Guillain Barre or need for LP. Pt has good vital capacity. This morning on rounds patient moving all extremities against gravity and able to sit up without assistance. Physical exam today unremarkable without focal findings. Evaluated by Physical therapy again yesterday with recommendation for [facility] and rolling walker - Home Health RN order, walker provided on discharge   5) DVT ppx: Heparin [place] tid  6) Disposition: pt vastly improved and stable for discharge home.  Should follow-up with  her PCP as instructed. Emphasized compliance with her medications.   The patient does have a current PCP (REESE,BETTI D, MD), therefore will not be requiring OPC follow-up after discharge.   The patient does not have transportation limitations that hinder  transportation to clinic appointments.  .Services Needed at time of discharge: Y = Yes, Blank = No PT:   OT:   RN: Yes  Equipment:   Other:     LOS: 3 days   Amanda [name] 01/05/2013, 9:12 AM

## 2013-01-06 LAB — BASIC METABOLIC PANEL
BUN: 7 mg/dL (ref 6–23)
CO2: 24 mEq/L (ref 19–32)
Calcium: 8 mg/dL — ABNORMAL LOW (ref 8.4–10.5)
Chloride: 106 mEq/L (ref 96–112)
Creatinine, Ser: 0.53 mg/dL (ref 0.50–1.10)
GFR calc Af Amer: >90 mL/min (ref >90)
GFR calc non Af Amer: >90 mL/min (ref >90)
Glucose, Bld: 124 mg/dL — ABNORMAL HIGH (ref 70–99)
Potassium: 3.9 mEq/L (ref 3.5–5.1)
Sodium: 137 mEq/L (ref 135–145)

## 2013-01-06 LAB — CBC WITH DIFFERENTIAL/PLATELET
Basophils Absolute: 0 10*3/uL (ref 0.0–0.1)
Basophils Relative: 0 % (ref 0–1)
Eosinophils Absolute: 0 10*3/uL (ref 0.0–0.7)
Eosinophils Relative: 0 % (ref 0–5)
HCT: 29 % — ABNORMAL LOW (ref 36.0–46.0)
Hemoglobin: 10.2 g/dL — ABNORMAL LOW (ref 12.0–15.0)
Lymphocytes Relative: 37 % (ref 12–46)
Lymphs Abs: 1.8 10*3/uL (ref 0.7–4.0)
MCH: 29.5 pg (ref 26.0–34.0)
MCHC: 35.2 g/dL (ref 30.0–36.0)
MCV: 83.8 fL (ref 78.0–100.0)
Monocytes Absolute: 0.6 10*3/uL (ref 0.1–1.0)
Monocytes Relative: 12 % (ref 3–12)
Neutro Abs: 2.5 10*3/uL (ref 1.7–7.7)
Neutrophils Relative %: 51 % (ref 43–77)
Platelets: 142 10*3/uL — ABNORMAL LOW (ref 150–400)
RBC: 3.46 MIL/uL — ABNORMAL LOW (ref 3.87–5.11)
RDW: 12.5 % (ref 11.5–15.5)
WBC: 4.9 10*3/uL (ref 4.0–10.5)

## 2013-01-06 LAB — GLUCOSE, CAPILLARY: Glucose-Capillary: 118 mg/dL — ABNORMAL HIGH (ref 70–99)

## 2013-01-06 NOTE — Progress Notes (Signed)
Received call from Unit RN that pt was d/c home and sister, Rosann Auerbach had called back requesting assistance with medications. Rikki Spearing, # [phone] and she states meds are $400 at [facility]. Her sister is unable to afford them at this time. NCM contacted [facility] and they do have Rx. Faxed [place] letter to [facility] on [address] # [phone]. NCM did make sister aware program can be used once per year and meds will have a $3.00 copay. Jonnie Finner RN CCM Case Mgmt phone [phone]

## 2013-01-06 NOTE — Discharge Summary (Signed)
[facility] Discharge Note  Name: Amanda [name] MRN: [id number] DOB: [birth year] 62 y.o.  Date of Admission: 01/02/2013  6:57 AM Date of Discharge: 01/06/2013 Attending Physician: Axel Filler, MD  Discharge Diagnosis:  DKA (diabetic ketoacidoses)  Influenza A (H1N1)  Generalized weakness  UTI (lower urinary tract infection)   Discharge Medications:   Medication List     As of 01/06/2013  9:10 AM    TAKE these medications         acetaminophen 325 MG tablet   Commonly known as: TYLENOL   Take 2 tablets (650 mg total) by mouth every 6 (six) hours as needed (or Fever >/= 101).      glucose blood test strip   Use as instructed      insulin aspart 100 UNIT/ML injection   Commonly known as: novoLOG   Inject 6-10 Units into the skin 3 (three) times daily before meals. SLIDING SCALE ONLY  WITH MEALS      insulin glargine 100 UNIT/ML injection   Commonly known as: LANTUS   Inject 8 Units into the skin at bedtime.      levofloxacin 500 MG tablet   Commonly known as: LEVAQUIN   Take 1 tablet (500 mg total) by mouth daily.      oseltamivir 75 MG capsule   Commonly known as: TAMIFLU   Take 1 capsule (75 mg total) by mouth 2 (two) times daily.      [place]   1 Device by Does not apply route 3 (three) times daily with meals as needed.        Disposition and follow-up:   Ms.Genoa R Ottenbacher was [facility].  At the hospital follow up visit please address  1) Control of T2DM Patient with interval increase in HbA1c from 7.2 in 2011 to 15.4 here. Suspect poor compliance with insulin at home. Discharged her on Lantus 8U qhs and 6-10U premeals as determined by carb counting. She will need tighter control of disease in outpatient setting. 2) LE weakness Suspect related to diabetic neuropathy. This will need monitoring in outpatient setting.   Follow-up Appointments:       Follow-up Information    Schedule an appointment as soon as possible for a visit with REESE,BETTI D, MD. (Call primary care doctor on Monday and make an appointment as soon as possible for hospital follow up. )    Contact information:   [facility] [address] [place] [address] [phone]         Discharge Orders    Future Orders Please Complete By Expires   Diet - low sodium heart healthy      Increase activity slowly      Discharge instructions      Comments:   Pick up your prescriptions from the pharmacy and take as instructed.  Follow-up with your Primary Doctor as instructed. Home Health will contact you this week to evaluate your progress.      Consultations: Treatment Team:  Md Pccm, MD Neuro  Procedures Performed:  Dg Chest 2 View  01/02/2013  *RADIOLOGY REPORT*  Clinical Data: Cough, diabetic ketoacidosis  CHEST - 2 VIEW  Comparison: Prior chest x-ray 12/30/2012  Findings: The lungs are well-aerated and free from pulmonary edema, focal airspace consolidation or pulmonary nodule.  Cardiac and mediastinal contours are within normal limits.  Query trace aortic atherosclerotic calcifications.  No pneumothorax, or pleural  effusion. No acute osseous findings.  IMPRESSION:  No acute cardiopulmonary disease, specifically no evidence of pneumonia.   Original Report Authenticated By: Jacqulynn Cadet, M.D.    Mr Cervical Spine Wo Contrast  01/03/2013  *RADIOLOGY REPORT*  Clinical Data: Lower extremity weakness.  Falls.  MRI CERVICAL SPINE WITHOUT CONTRAST  Technique:  Multiplanar and multiecho pulse sequences of the cervical spine, to include the craniocervical junction and cervicothoracic junction, were obtained according to standard protocol without intravenous contrast.  Comparison: None.  Findings: The craniocervical junction appears unremarkable.  Inversion recovery weighted images demonstrate no significant abnormal vertebral or  periligamentous edema.  No significant vertebral subluxation.  No significant abnormal cord signal is identified.  Degenerative arthropathy at the sternoclavicular joints noted bilaterally  Additional findings at individual levels are as follows:  C2-3:  Unremarkable.  C3-4:  No impingement.  Mild bilateral facet arthropathy.  C4-5:  No impingement.  Mild bilateral facet arthropathy.  C5-6:  No impingement.  Mild left facet arthropathy.  C6-7:  Moderate left and mild right foraminal stenosis and mild central stenosis due to disc bulge, uncinate spurring, and facet arthropathy.  C7-T1:  Borderline bilateral foraminal stenosis due to uncinate and facet spurring.  IMPRESSION:  1.  Cervical spondylosis and degenerative disc disease, contributing to moderate impingement at C6-7 and borderline impingement at C7-T1. 2.  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   Original Report Authenticated By: Van Clines, M.D.    Mr Lumbar Spine W Wo Contrast  01/02/2013  *RADIOLOGY REPORT*  Clinical Data: Bilateral lower extremity weakness.  Bowel and bladder incontinence.  MRI LUMBAR SPINE WITHOUT AND WITH CONTRAST  Technique:  Multiplanar and multiecho pulse sequences of the lumbar spine were obtained without and with intravenous contrast.  Contrast: 101mL MULTIHANCE GADOBENATE DIMEGLUMINE 529 MG/ML IV SOLN  Comparison: None  Findings: Examination is limited by patient motion.  The sagittal MR images demonstrate normal alignment of the lumbar vertebral bodies.  They demonstrate normal marrow signal.  The last full intervertebral disc space is labeled L5-S1.  The conus medullaris terminates at L1 and appears normal.  The facets are normally aligned.  No pars defects.  No significant paraspinal or retroperitoneal process is identified.  There is moderate distention of the bladder.  No focal disc protrusions, spinal or foraminal stenosis at L1-2, L2- 3, L3-4 and L4-5  L5-S1:  Mild disc desiccation and  shallow central disc protrusion but no significant neural compression.  No foraminal stenosis.  IMPRESSION:  1. Mild disc desiccation and shallow central disc protrusion at L5- S1. 2. Normal appearance of the lower thoracic spinal cord. 3. Moderate distention of the bladder.   Original Report Authenticated By: Marijo Sanes, M.D.    Dg Chest Port 1 View  01/03/2013  *RADIOLOGY REPORT*  Clinical Data: Hypotension.  Flu.  PORTABLE CHEST - 1 VIEW  Comparison: 01/02/2013  Findings: Mild peribronchial thickening.  No confluent airspace opacities.  No effusions.  Heart is upper limits normal in size. No acute bony abnormality.  IMPRESSION: Peribronchial thickening.   Original Report Authenticated By: Rolm Baptise, M.D.    Dg Chest Portable 1 View  12/30/2012  *RADIOLOGY REPORT*  Clinical Data: Chest pain.  Cough.  PORTABLE CHEST - 1 VIEW  Comparison: Chest x-ray 10/07/2010.  Findings: Lung volumes are normal.  No consolidative airspace disease.  No pleural effusions.  No pneumothorax.  No pulmonary nodule or mass noted.  Pulmonary vasculature and the cardiomediastinal silhouette are within normal limits.  A small skin  fold artifact is seen in the lateral aspect of the lower right hemithorax.  Atherosclerotic calcifications are noted within the arch of the aorta.  IMPRESSION: 1. No radiographic evidence of acute cardiopulmonary disease. 2.  Atherosclerosis.   Original Report Authenticated By: Vinnie Langton, M.D.     Admission HPI:  Ms. Giannuzzi is a 62 yo W with PMH of DM, HTN presents to the ED for nausea (but n o vomiting), and generalized weakness in lower legs. Patient reports that she has been having a cough and rhinorrhea for about 2 weeks but now resolved. Last week she went to the emergency room and was seen for chest pain which is resolved. She reported leg weakness in the past several days. She also has chills, abdominal pain on the lower quadrants, increased in thirst, burning sensation with urinating,  but denies any increasing urinary frequency or fever, chestpain, SOB, or cough. She also states that she has constipation in the past 6 months however sometimes she is unable to control her bowel and had explosive bowel movement. In the ED she also started to have a headache. As for her diabetes, she takes 6-10 units of NovoLog with meals. Of note, patient was seen in the ED on 12/30/12 and at that time her gap was 21 however she was sent home. She states that she is very anxious and would like some medication to calm her down.  Today in the ED, patient was found have a gap of 26 with blood glucose of 495. He was given 4 L of normal saline boluses.    Hospital Course by problem list: 1) DKA  AG 26 on admission. Started on insulin drip, potassium supplementation. Normal WBC on admission. No infiltrate on CXR. Urine with 11-20 WBCs but negative nitrite.  Flu PCR returned + for flu A and H1N1 (see #2) below. Suspect flu precipitated DKA. AG closed the night of admission and she was transitioned to [place] insulin and diet. A1c increase from 7.2 in 2011 to 15.4 during admission. Such a high A1c seems out of proportion to blood glucoses here, which were in low to mid 100s after several days if po intake. Suspect noncompliance with insulin at home. Discharged patient on 8U of lantus and aspart coverage calculated for carb intake. She will follow up with Dr. Zenia Resides office (her PCP).   2) Influenza A/H1N1  PCR positive flu A and H1N1. Tamiflu was started the day of admission for a 5 day course. Patient did spike a couple of fevers on hospital day 1 and 2, as high as 102.8, accompanied by hypotension (90s/50s). No leukocytosis. Patient's blood pressure recovered after 500cc fluid bolus. Had some nasal congestion and cough but no complaints of SOB. CXR x2 negative for PNA. Suspect that fevers were related to the flu and not secondary bacterial PNA. Antibiotic used to treat UTI (see # 3 below) would cover for CAP as well  (levaquin). Blood clutures NGTD.  Patient felt much better at time of discharge, walking around halls and bathing herself. Almost back at full strength.   3) Urinary tract infection  Urine returned with 11-20 WBCs, nitrite is negative. Received IV Rocephin for 2 days, then transitioned to oral levaquin. Levaquin was continued on discharge. Urine culture returned coag-negative Staph species, sensitive to levaquin. She should be adequately treated.   4) Lower extremity and upper extremity weakness  Patient presented with complaints of LE weankess for "weeks." During admission, she was noted to have weakness that appeared to  come and go throughout the day. MRI of cervical and lumbar spine unrevealing for cause of weakness. She was areflexic in lower extremities. Neuro (Dr. Armida Sans)  was consulted to evaluate for possible Guillain Barre Syndrome as patient had distal>proximal LE weakness, no reflexes, and current viral infection as possible precipitant. They were confident that symptoms were not consistent with GBS and patient did not require LP. Suspected LE areflexia a sequela of poorly controlled T2DM. PCCM (Dr. Titus Mould) also saw and noted good vital capacity as demonstrated by NIF.  On hospital day 3, patient was able to move all extremities with nearly 5/5 strength of bilateral LE.  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 Physical therapy evaluated her and noted good mobility. Recommended short term home health assistance for ambulation, as well as rolling walker.   Discharge Vitals:  BP 136/81  Pulse 89  Temp 98.7 F (37.1 C) (Oral)  Resp 16  Ht 5\' 2"  (1.575 m)  Wt 141 lb 11.2 oz (64.275 kg)  BMI 25.92 kg/m2  SpO2 99%  Discharge Labs:  Results for orders placed during the hospital encounter of 01/02/13 (from the past 24 hour(s))  GLUCOSE, CAPILLARY     Status: Abnormal   Collection Time   01/05/13  11:58 AM      Component Value Range   Glucose-Capillary 226 (*) 70 - 99 mg/dL   Comment 1 Notify RN    GLUCOSE, CAPILLARY     Status: Abnormal   Collection Time   01/05/13  9:01 PM      Component Value Range   Glucose-Capillary 122 (*) 70 - 99 mg/dL  CBC WITH DIFFERENTIAL     Status: Abnormal   Collection Time   01/06/13  6:12 AM      Component Value Range   WBC 4.9  4.0 - 10.5 K/uL   RBC 3.46 (*) 3.87 - 5.11 MIL/uL   Hemoglobin 10.2 (*) 12.0 - 15.0 g/dL   HCT 29.0 (*) 36.0 - 46.0 %   MCV 83.8  78.0 - 100.0 fL   MCH 29.5  26.0 - 34.0 pg   MCHC 35.2  30.0 - 36.0 g/dL   RDW 12.5  11.5 - 15.5 %   Platelets 142 (*) 150 - 400 K/uL   Neutrophils Relative 51  43 - 77 %   Lymphocytes Relative 37  12 - 46 %   Monocytes Relative 12  3 - 12 %   Eosinophils Relative 0  0 - 5 %   Basophils Relative 0  0 - 1 %   Neutro Abs 2.5  1.7 - 7.7 K/uL   Lymphs Abs 1.8  0.7 - 4.0 K/uL   Monocytes Absolute 0.6  0.1 - 1.0 K/uL   Eosinophils Absolute 0.0  0.0 - 0.7 K/uL   Basophils Absolute 0.0  0.0 - 0.1 K/uL   RBC Morphology ELLIPTOCYTES     WBC Morphology ATYPICAL LYMPHOCYTES     Smear Review LARGE PLATELETS PRESENT    BASIC METABOLIC PANEL     Status: Abnormal   Collection Time   01/06/13  6:12 AM      Component Value Range   Sodium 137  135 - 145 mEq/L   Potassium 3.9  3.5 - 5.1 mEq/L   Chloride 106  96 - 112 mEq/L   CO2 24  19 - 32 mEq/L   Glucose, Bld 124 (*) 70 - 99 mg/dL   BUN  7  6 - 23 mg/dL   Creatinine, Ser 0.53  0.50 - 1.10 mg/dL   Calcium 8.0 (*) 8.4 - 10.5 mg/dL   GFR calc non Af Amer >90  >90 mL/min   GFR calc Af Amer >90  >90 mL/min  GLUCOSE, CAPILLARY     Status: Abnormal   Collection Time   01/06/13  7:46 AM      Component Value Range   Glucose-Capillary 118 (*) 70 - 99 mg/dL    Signed: Tonia Brooms 01/06/2013, 9:10 AM   Time Spent on Discharge: 35 min Services Ordered on Discharge: [place] on Discharge: Conservation officer, nature

## 2013-01-09 LAB — CULTURE, BLOOD (ROUTINE X 2)
Culture: NO GROWTH
Culture: NO GROWTH

## 2013-01-24 ENCOUNTER — Telehealth: Payer: Self-pay | Admitting: *Deleted

## 2013-01-24 NOTE — Telephone Encounter (Signed)
Yes, she can see the patient one more time.  Thanks Karnell Vanderloop

## 2013-01-24 NOTE — Telephone Encounter (Signed)
Nurse informed

## 2013-01-24 NOTE — Telephone Encounter (Signed)
Call from [place] with RN [facility] [phone] Nurse has been seeing pt for diabetic teaching.  She wants to see pt for one more visit. We saw pt in hospital and she has [facility] appointment 2/13 in clinic.  She plans on changing to our service.  Can I give permission for 1 last visit from [facility]?

## 2013-01-31 ENCOUNTER — Ambulatory Visit: Payer: Self-pay | Admitting: Internal Medicine

## 2013-01-31 ENCOUNTER — Ambulatory Visit: Payer: Self-pay

## 2013-02-07 ENCOUNTER — Ambulatory Visit: Payer: Self-pay | Admitting: Internal Medicine

## 2013-02-07 ENCOUNTER — Ambulatory Visit: Payer: Self-pay

## 2013-02-14 ENCOUNTER — Ambulatory Visit (INDEPENDENT_AMBULATORY_CARE_PROVIDER_SITE_OTHER): Payer: Self-pay | Admitting: Internal Medicine

## 2013-02-14 ENCOUNTER — Encounter: Payer: Self-pay | Admitting: Internal Medicine

## 2013-02-14 ENCOUNTER — Ambulatory Visit: Payer: Self-pay

## 2013-02-14 VITALS — BP 147/79 | HR 93 | Temp 97.5°F | Ht 62.0 in | Wt 136.8 lb

## 2013-02-14 DIAGNOSIS — R5381 Other malaise: Secondary | ICD-10-CM

## 2013-02-14 DIAGNOSIS — R531 Weakness: Secondary | ICD-10-CM

## 2013-02-14 DIAGNOSIS — D649 Anemia, unspecified: Secondary | ICD-10-CM

## 2013-02-14 DIAGNOSIS — E1165 Type 2 diabetes mellitus with hyperglycemia: Secondary | ICD-10-CM

## 2013-02-14 DIAGNOSIS — Z79899 Other long term (current) drug therapy: Secondary | ICD-10-CM

## 2013-02-14 DIAGNOSIS — E119 Type 2 diabetes mellitus without complications: Secondary | ICD-10-CM | POA: Insufficient documentation

## 2013-02-14 DIAGNOSIS — Z299 Encounter for prophylactic measures, unspecified: Secondary | ICD-10-CM

## 2013-02-14 DIAGNOSIS — Z23 Encounter for immunization: Secondary | ICD-10-CM | POA: Insufficient documentation

## 2013-02-14 DIAGNOSIS — E1110 Type 2 diabetes mellitus with ketoacidosis without coma: Secondary | ICD-10-CM

## 2013-02-14 DIAGNOSIS — IMO0001 Reserved for inherently not codable concepts without codable children: Secondary | ICD-10-CM

## 2013-02-14 DIAGNOSIS — I10 Essential (primary) hypertension: Secondary | ICD-10-CM

## 2013-02-14 DIAGNOSIS — E118 Type 2 diabetes mellitus with unspecified complications: Secondary | ICD-10-CM | POA: Insufficient documentation

## 2013-02-14 HISTORY — DX: Anemia, unspecified: D64.9

## 2013-02-14 LAB — LIPID PANEL
Cholesterol: 210 mg/dL — ABNORMAL HIGH (ref 0–200)
HDL: 79 mg/dL (ref >39)
LDL Cholesterol: 123 mg/dL — ABNORMAL HIGH (ref 0–99)
Total CHOL/HDL Ratio: 2.7 Ratio
Triglycerides: 42 mg/dL (ref <150)
VLDL: 8 mg/dL (ref 0–40)

## 2013-02-14 LAB — GLUCOSE, CAPILLARY: Glucose-Capillary: 160 mg/dL — ABNORMAL HIGH (ref 70–99)

## 2013-02-14 LAB — ANEMIA PANEL
%SAT: 28 % (ref 20–55)
ABS Retic: 60.8 10*3/uL (ref 19.0–186.0)
Ferritin: 152 ng/mL (ref 10–291)
Folate: 19.6 ng/mL
Iron: 74 ug/dL (ref 42–145)
RBC.: 4.05 MIL/uL (ref 3.87–5.11)
Retic Ct Pct: 1.5 % (ref 0.4–2.3)
TIBC: 264 ug/dL (ref 250–470)
UIBC: 190 ug/dL (ref 125–400)
Vitamin B-12: 813 pg/mL (ref 211–911)

## 2013-02-14 LAB — POCT GLYCOSYLATED HEMOGLOBIN (HGB A1C): Hemoglobin A1C: 9.6

## 2013-02-14 MED ORDER — INSULIN GLARGINE 100 UNIT/ML ~~LOC~~ SOLN: 12.0000 [IU] | Freq: Every day | SUBCUTANEOUS | Status: DC

## 2013-02-14 MED ORDER — LISINOPRIL 5 MG PO TABS: 5.0000 mg | ORAL_TABLET | Freq: Every day | ORAL | Status: DC

## 2013-02-14 NOTE — Assessment & Plan Note (Signed)
Pneumococcal vaccination given today.

## 2013-02-14 NOTE — Assessment & Plan Note (Signed)
I will start patient on lisinopril 5 mg daily considering patient diabetes status. I will reevaluate patient in 2 weeks.

## 2013-02-14 NOTE — Progress Notes (Signed)
Subjective:   Patient ID: Amanda [name] female   DOB: 10-16-1951 62 y.o.   MRN: [id number]  HPI: Amanda [name] is a 62 y.o. female with past medical history significant as outlined below who presented to the clinic for the first time. Patient was admitted on January 15 and was discharged on January 19 for DKA as well as Influenza and UTI.  Patient reports that she has been doing fine since being discharge. She had been using her walker less and less and her muscle strength has significantly improved. She has no complaints except occasionally episodes of diarrhea and constipation along with bloating.  Diabetes: Patient is currently taking Lantus 8 units daily. She has brought her meter with her but unfortunately were not able to read it. Per patient's report her morning blood sugars are ranging between 170-180s and evening CBGs over 200s. She has improved his diet but has not started yet to exercise but is more than ready to do so. She's planning to sign up at a gym.    Past Medical History  Diagnosis Date  . Diabetes mellitus   . Hypotension   . Influenza A (H1N1) 01/03/2013  . DKA (diabetic ketoacidoses) 01/02/2013  . Anemia    Current Outpatient Prescriptions  Medication Sig Dispense Refill  . aspirin 81 MG tablet Take 81 mg by mouth daily.      [name] acetaminophen (TYLENOL) 325 MG tablet Take 2 tablets (650 mg total) by mouth every 6 (six) hours as needed (or Fever >/= 101).  60 tablet  0  . Blood Glucose Monitoring Suppl (RELION PRIME MONITOR) DEVI 1 Device by Does not apply route 3 (three) times daily with meals as needed.  1 Device  0  . glucose blood (RELION PRIME TEST) test strip Use as instructed  100 each  12  . insulin glargine (LANTUS) 100 UNIT/ML injection Inject 8 Units into the skin at bedtime.  10 mL  3   No current facility-administered medications for this visit.   Family History  Problem Relation Age of Onset  . Diabetes Mother   . Hypertension Mother   .  Aneurysm Father   . Diabetes Sister   . Hypertension Sister   . Kidney disease Sister     One Kidney transplant, one sister on HD   History   Social History  . Marital Status: Married    Spouse Name: N/A    Number of Children: N/A  . Years of Education: N/A   Social History Main Topics  . Smoking status: Never Smoker   . Smokeless tobacco: Never Used  . Alcohol Use: No  . Drug Use: No  . Sexually Active: None   Other Topics Concern  . None   Social History Narrative  . None   Review of Systems: Constitutional: Denies fever, chills, diaphoresis, appetite change and fatigue.  HEENT: Denies photophobia, eye pain, redness, hearing loss, ear pain, congestion, sore throat, trouble swallowing, neck pain, neck stiffness and tinnitus but noted some trouble seeing.   Respiratory: Denies SOB, DOE, cough, chest tightness,  and wheezing.   Cardiovascular: Denies chest pain, palpitations and leg swelling.  Gastrointestinal: Denies nausea, vomiting, abdominal pain,  blood in stool but noted occasionally diarrhea, constipation, and abdominal bloating  Genitourinary: Denies dysuria, urgency, frequency, hematuria, flank pain and difficulty urinating.  Musculoskeletal: Denies myalgias,  joint swelling, arthralgias and gait problem.  Skin: Denies pallor, rash and wound.  Neurological: Denies dizziness, seizures, syncope,light-headedness, numbness and headaches.  Hematological:  Denies adenopathy. Easy bruising, personal or family bleeding history    Objective:  Physical Exam: Filed Vitals:   02/14/13 1106  BP: 183/88  Pulse: 100  Temp: 97.5 F (36.4 C)  TempSrc: Oral  Height: 5\' 2"  (1.575 m)  Weight: 136 lb 12.8 oz (62.052 kg)  SpO2: 98%   Constitutional: Vital signs reviewed.  Patient is a well-developed and well-nourished female in no acute distress and cooperative with exam. Alert and oriented x3.  Ear: TM normal bilaterally Mouth: no erythema or exudates, MMM Eyes: PERRL, EOMI,  conjunctivae normal, No scleral icterus.  Neck: Supple,  Cardiovascular: RRR, S1 normal, S2 normal, no MRG, pulses symmetric and intact bilaterally Pulmonary/Chest: CTAB, no wheezes, rales, or rhonchi Abdominal: Soft. Non-tender, non-distended, bowel sounds are normal, GU: no CVA tenderness Musculoskeletal: No joint deformities, erythema, or stiffness, ROM full and no nontender Hematology: no cervical adenopathy.  Neurological: A&O x3, Strength is normal and symmetric bilaterally, cranial nerve II-XII are grossly intact, no focal motor deficit, sensory intact to light touch bilaterally.  Skin: Warm, dry and intact. No rash, cyanosis, or clubbing.

## 2013-02-14 NOTE — Assessment & Plan Note (Signed)
Patient reports it is resolved. Neurologic exam was unremarkable. We'll continue to monitor.

## 2013-02-14 NOTE — Patient Instructions (Signed)
1. please increase her Lantus to 12 units at bedtime. 2. I will start you on a blood pressure medication called lisinopril. You have to take it every day. 3. Please followup with Amanda [name] ( financial advisor) 4. Amanda [name] our diabetic and nutrition educator will get in touch with you 5. if you have any questions please call the clinic. Number is [phone]

## 2013-02-14 NOTE — Assessment & Plan Note (Signed)
Will obtain anemia panel today. Patient reports she had a colonoscopy 10 years ago and polyps were removed at that time. But she did not followup after that. She said colonoscopy in the near future. Currently no insurance. Referred patient to Bonna Gains to assist.

## 2013-02-14 NOTE — Assessment & Plan Note (Signed)
Influenza vaccination given today. 

## 2013-02-14 NOTE — Assessment & Plan Note (Addendum)
Hemoglobin A1c today is 9.6. Which is surprising down from 15.6 a month ago in the hospital. Considering CBGs in the 170 range in the morning I will increase Lantus to 12 units daily. Patient was given from the social worker components one in the hospital so she is able to buy Lantus at this point. Patient was instructed to take Novolog tid but she has not been doing this. She tried initially but noticed that her sugars were too low. I will hold off for now.  I will refer patient to diabetic educator.  Foot exam was performed today. I will obtain microalbumin/crea ratio today appeared

## 2013-02-15 ENCOUNTER — Telehealth: Payer: Self-pay | Admitting: Internal Medicine

## 2013-02-15 DIAGNOSIS — E785 Hyperlipidemia, unspecified: Secondary | ICD-10-CM | POA: Insufficient documentation

## 2013-02-15 DIAGNOSIS — E1169 Type 2 diabetes mellitus with other specified complication: Secondary | ICD-10-CM | POA: Insufficient documentation

## 2013-02-15 LAB — MICROALBUMIN / CREATININE URINE RATIO
Creatinine, Urine: 55.8 mg/dL
Microalb Creat Ratio: 1157.5 mg/g — ABNORMAL HIGH (ref 0.0–30.0)
Microalb, Ur: 64.59 mg/dL — ABNORMAL HIGH (ref 0.00–1.89)

## 2013-02-15 MED ORDER — PRAVASTATIN SODIUM 20 MG PO TABS: 20.0000 mg | ORAL_TABLET | Freq: Every day | ORAL | Status: DC

## 2013-02-15 NOTE — Telephone Encounter (Signed)
I called patient to discuss about lab work. Patient was unavailable I left a message. I will start patient on pravastatin 20 mg daily. I will send prescription to the pharmacy.

## 2013-03-04 ENCOUNTER — Encounter: Payer: Self-pay | Admitting: Internal Medicine

## 2013-03-04 ENCOUNTER — Encounter: Payer: Self-pay | Admitting: Dietician

## 2013-03-04 ENCOUNTER — Ambulatory Visit: Payer: Self-pay

## 2013-03-12 ENCOUNTER — Encounter: Payer: Self-pay | Admitting: Internal Medicine

## 2013-03-12 ENCOUNTER — Ambulatory Visit: Payer: Self-pay

## 2013-03-12 ENCOUNTER — Ambulatory Visit (INDEPENDENT_AMBULATORY_CARE_PROVIDER_SITE_OTHER): Payer: Self-pay | Admitting: Dietician

## 2013-03-12 ENCOUNTER — Ambulatory Visit (INDEPENDENT_AMBULATORY_CARE_PROVIDER_SITE_OTHER): Payer: Self-pay | Admitting: Internal Medicine

## 2013-03-12 ENCOUNTER — Encounter: Payer: Self-pay | Admitting: Dietician

## 2013-03-12 ENCOUNTER — Other Ambulatory Visit: Payer: Self-pay | Admitting: Internal Medicine

## 2013-03-12 VITALS — BP 162/88 | HR 93 | Temp 97.7°F | Ht 62.0 in | Wt 138.8 lb

## 2013-03-12 DIAGNOSIS — J302 Other seasonal allergic rhinitis: Secondary | ICD-10-CM

## 2013-03-12 DIAGNOSIS — M722 Plantar fascial fibromatosis: Secondary | ICD-10-CM

## 2013-03-12 DIAGNOSIS — I10 Essential (primary) hypertension: Secondary | ICD-10-CM

## 2013-03-12 DIAGNOSIS — E1165 Type 2 diabetes mellitus with hyperglycemia: Secondary | ICD-10-CM

## 2013-03-12 DIAGNOSIS — E118 Type 2 diabetes mellitus with unspecified complications: Secondary | ICD-10-CM

## 2013-03-12 DIAGNOSIS — M2540 Effusion, unspecified joint: Secondary | ICD-10-CM

## 2013-03-12 DIAGNOSIS — J309 Allergic rhinitis, unspecified: Secondary | ICD-10-CM

## 2013-03-12 LAB — GLUCOSE, CAPILLARY: Glucose-Capillary: 121 mg/dL — ABNORMAL HIGH (ref 70–99)

## 2013-03-12 MED ORDER — LISINOPRIL 10 MG PO TABS: 10.0000 mg | ORAL_TABLET | Freq: Every day | ORAL | Status: DC

## 2013-03-12 MED ORDER — IBUPROFEN 200 MG PO TABS: 200.0000 mg | ORAL_TABLET | Freq: Two times a day (BID) | ORAL | Status: DC

## 2013-03-12 MED ORDER — LORATADINE 10 MG PO TABS: 10.0000 mg | ORAL_TABLET | Freq: Every day | ORAL | Status: DC

## 2013-03-12 NOTE — Progress Notes (Signed)
Diabetes Self-Management Education  Visit Number: First/Initial  03/12/2013 [name], identified by name and date of birth, is a 62 y.o. female with Diabetes Type: Type 2.  Other people present during visit:      ASSESSMENT  Patient Concerns:  Nutrition/Meal planning;Monitoring;Glycemic Control;Support  Ht and weight in doctors visit  Lab Results: LDL Cholesterol  Date Value Range Status  02/14/2013 123* 0 - 99 mg/dL Final                 Hemoglobin A1C  Date Value Range Status  02/14/2013 9.6   Final  01/02/2013 15.8* <5.7 % Final     (NOTE)     Family History  Problem Relation Age of Onset  . Diabetes Mother   . Hypertension Mother   . Aneurysm Father   . Diabetes Sister   . Hypertension Sister   . Kidney disease Sister     One Kidney transplant, one sister on HD   History  Substance Use Topics  . Smoking status: Never Smoker   . Smokeless tobacco: Never Used  . Alcohol Use: No    Support Systems:  Other (comment) Special Needs:     Prior DM Education:   Daily Foot Exams: Yes Patient Belief / Attitude about Diabetes:  Diabetes can be controlled  Assessment comments: patient very interested in diabetes self care.     Diet Recall: 3 meals and 3 snacks   Individualized Plan for Diabetes Self-Management Training:  Patient individualized diabetes plan discussed today with patient and includes:  Nutrition, medications, monitoring, physical activity, how to handle highs and lows, Special care for my body when I have diabetes, Dealing daily with diabetes  Education Topics Reviewed with Patient Today:  Topic Points Discussed  Disease State    Nutrition Management Role of diet in the treatment of diabetes and the relationship between the three main macronutrients and blood glucose level  Physical Activity and Exercise Role of exercise on diabetes management, blood pressure control and cardiac health.  Medications Reviewed patients medication for  diabetes, action, purpose, timing of dose and side effects.  Monitoring Purpose and frequency of SMBG.  Acute Complications    Chronic Complications    Psychosocial Adjustment    Goal Setting    Preconception Care (if applicable)      PATIENTS GOALS   Learning Objective(s):     Goal The patient agrees to:  Nutrition    Physical Activity Exercise 5-7 days per week  Medications Other (comment)  Monitoring    Problem Solving    Reducing Risk    Health Coping     Patient Self-Evaluation of Goals (Subsequent Visits)  Goal The patient rates self as meeting goals (% of time)  Nutrition    Physical Activity    Medications    Monitoring    Problem Solving    Reducing Risk    Health Coping       PERSONALIZED PLAN / SUPPORT  Self-Management Support:  [place] office;Family;CDE visits ______________________________________________________________________  Outcomes  Expected Outcomes:  Demonstrated interest in learning. Expect positive outcomes Self-Care Barriers:  None Education material provided: yes If problems or questions, patient to contact team via:  Phone Time in: 1210    Time out: 1300 Future DSME appointment: - 4-6 wks   Amanda [name] 03/12/2013 1:33 PM

## 2013-03-12 NOTE — Progress Notes (Signed)
Subjective:   Patient ID: DELCIA [name] female   DOB: [birth year] 62 y.o.   MRN: [id number]  HPI: Amanda [name] is a 62 y.o. female with past medical history significant as outlined below who presented to the clinic for regular followup. Patient reports her only concern today is runny nose, itchy eyes. She is known to have seasonal allergies. Denies any fevers or chills, cough, shortness of breath. Patient further noted that she has been experiencing episodes of constipation and A. and followed by diarrhea. Denies any abdominal pain, blood in the stool, nausea vomiting. This has been present since her last six-month likely.  Furthermore patient is complaining about hand and ankle swelling. Patient reports that her hands in the morning of very stiff and swollen and it improves during the day somewhat. She further noted swelling in her ankles bilaterally. It does improve with elevation. Patient further endorses plantar pain. Whenever she wears flat shoes her symptoms worsen.  She would stretch  her feet which helps somewhat.   Patient has met Bonna Gains today and we will try to obtain insurance.       Past Medical History  Diagnosis Date  . Diabetes mellitus   . Hypotension   . Influenza A (H1N1) 01/03/2013  . DKA (diabetic ketoacidoses) 01/02/2013  . Anemia    Current Outpatient Prescriptions  Medication Sig Dispense Refill  . acetaminophen (TYLENOL) 325 MG tablet Take 2 tablets (650 mg total) by mouth every 6 (six) hours as needed (or Fever >/= 101).  60 tablet  0  . aspirin 81 MG tablet Take 81 mg by mouth daily.      . Blood Glucose Monitoring Suppl (RELION PRIME MONITOR) DEVI 1 Device by Does not apply route 3 (three) times daily with meals as needed.  1 Device  0  . glucose blood (RELION PRIME TEST) test strip Use as instructed  100 each  12  . insulin glargine (LANTUS) 100 UNIT/ML injection Inject 12 Units into the skin at bedtime.  10 mL  3  . lisinopril (PRINIVIL,ZESTRIL)  5 MG tablet Take 1 tablet (5 mg total) by mouth daily.  30 tablet  0  . pravastatin (PRAVACHOL) 20 MG tablet Take 1 tablet (20 mg total) by mouth daily.  30 tablet  2   No current facility-administered medications for this visit.   Family History  Problem Relation Age of Onset  . Diabetes Mother   . Hypertension Mother   . Aneurysm Father   . Diabetes Sister   . Hypertension Sister   . Kidney disease Sister     One Kidney transplant, one sister on HD   History   Social History  . Marital Status: Married    Spouse Name: N/A    Number of Children: N/A  . Years of Education: N/A   Social History Main Topics  . Smoking status: Never Smoker   . Smokeless tobacco: Never Used  . Alcohol Use: No  . Drug Use: No  . Sexually Active: Not on file   Other Topics Concern  . Not on file   Social History Narrative  . No narrative on file   Review of Systems: Constitutional: Denies fever, chills, diaphoresis, appetite change and fatigue.  HEENT: Noted redness, congestion, sore throat, rhinorrhea but denies trouble swallowing, neck pain, tinnitus, hearing loss or ear pain   Respiratory: Denies SOB, DOE, cough, chest tightness,  and wheezing.   Cardiovascular: Denies chest pain, palpitations  Gastrointestinal: Denies nausea, vomiting, abdominal  pain, , blood in stool and abdominal distention.  Genitourinary: Denies dysuria, urgency, frequency, hematuria, flank pain and difficulty urinating.  Musculoskeletal: Noted occasionally some numbness in her feet all the way to the knee bilaterally. Skin: Denies pallor, rash and wound.  Neurological: Denies dizziness,  weakness, light-headedness, numbness and headaches.  Hematological: Denies adenopathy. Easy bruising, personal or family bleeding history    Objective:  Physical Exam: Filed Vitals:   03/12/13 1052  BP: 183/100  Pulse: 98  Temp: 97.7 F (36.5 C)  TempSrc: Oral  Height: 5\' 2"  (1.575 m)  Weight: 138 lb 12.8 oz (62.959 kg)   SpO2: 99%   Constitutional: Vital signs reviewed.  Patient is a well-developed and well-nourished female in no acute distress and cooperative with exam. Alert and oriented x3.  Ear: TM normal bilaterally Mouth: no erythema or exudates, MMM Nose: mild erythema noted Eyes: PERRL, EOMI, conjunctivae mildly injected, No scleral icterus.  Neck: Supple,  Cardiovascular: RRR, S1 normal, S2 normal, no MRG, pulses symmetric and intact bilaterally Pulmonary/Chest: CTAB, no wheezes, rales, or rhonchi Abdominal: Soft. Non-tender, non-distended, bowel sounds are normal, no masses, organomegaly, or guarding present.  GU: no CVA tenderness Musculoskeletal: Bilateral metacarpal phalangeal joint joints as well as proximal interphalangeal joints: Mildly tender and puffy on palpation Bilateral ankle nonpitting edema notable. Range of motion within normal limits in all joints. No erythema noted. Hematology: no cervical  adenopathy.  Neurological: A&O x3, Strength is normal and symmetric bilaterally,  no focal motor deficit, sensory intact to light touch bilaterally.  Skin: Warm, dry and intact. No rash, cyanosis, or clubbing.

## 2013-03-12 NOTE — Patient Instructions (Signed)
1. Ibuprofen 200 mg twice a day for joints ( hand and ankle ) as well as feet 2. Loratadine every day for allergies

## 2013-03-12 NOTE — Patient Instructions (Addendum)
Keep walking as much as you can- that helps keep blood sugars stable and lower!  Please call us if you want [place] to call in a new meter at [facility].  Bring 2-3 days of blood sugar records with you to your next appointment.  Hunger can be a symptoms of low blood sugar- you could check your blood sugar to tell the difference between hunger and a low blood sugar.

## 2013-03-13 ENCOUNTER — Encounter: Payer: Self-pay | Admitting: Internal Medicine

## 2013-03-13 DIAGNOSIS — M2540 Effusion, unspecified joint: Secondary | ICD-10-CM | POA: Insufficient documentation

## 2013-03-13 NOTE — Assessment & Plan Note (Signed)
Past outpatient on loratadine 10 mg daily and reevaluate in the next office visit

## 2013-03-13 NOTE — Assessment & Plan Note (Signed)
Likely inflammatory in origin. Patient has currently no insurance. I would defer any further linear lab work and evaluation until insurance coverage is present. At this point I will treat patient symptomatically with ibuprofen 200 mg twice a day. Considering hip patient's history of hypertension and diabetes uncontrolled patient needs a basic metabolic panel during the next office visit to monitor renal function. She should not take ibuprofen for more than a month or at least close monitoring of renal function.

## 2013-03-13 NOTE — Assessment & Plan Note (Signed)
Foot pain most likely consistent with plantar bursitis. Patient would be started on ibuprofen 200 mg twice a day . Patient was further instructed with stretching exercises and use of good footwear.

## 2013-03-13 NOTE — Assessment & Plan Note (Signed)
Blood pressure continues to be elevated. I will increase lisinopril from 5 mg to 10 mg daily. During the next office visit patient needs to have a basic metabolic panel to follow renal function.

## 2013-04-08 ENCOUNTER — Encounter: Payer: Self-pay | Admitting: *Deleted

## 2013-04-19 ENCOUNTER — Ambulatory Visit (INDEPENDENT_AMBULATORY_CARE_PROVIDER_SITE_OTHER): Payer: Self-pay | Admitting: Dietician

## 2013-04-19 ENCOUNTER — Ambulatory Visit (HOSPITAL_COMMUNITY)
Admission: RE | Admit: 2013-04-19 | Discharge: 2013-04-19 | Disposition: A | Discharge status: Home / Self Care | Payer: Self-pay | Source: Ambulatory Visit | Attending: Internal Medicine | Admitting: Internal Medicine

## 2013-04-19 ENCOUNTER — Encounter: Payer: Self-pay | Admitting: Internal Medicine

## 2013-04-19 ENCOUNTER — Ambulatory Visit (INDEPENDENT_AMBULATORY_CARE_PROVIDER_SITE_OTHER): Payer: Self-pay | Admitting: Internal Medicine

## 2013-04-19 VITALS — BP 169/94 | HR 87 | Temp 97.0°F | Ht 63.0 in | Wt 143.3 lb

## 2013-04-19 DIAGNOSIS — E1165 Type 2 diabetes mellitus with hyperglycemia: Secondary | ICD-10-CM

## 2013-04-19 DIAGNOSIS — R079 Chest pain, unspecified: Secondary | ICD-10-CM | POA: Insufficient documentation

## 2013-04-19 DIAGNOSIS — N058 Unspecified nephritic syndrome with other morphologic changes: Secondary | ICD-10-CM

## 2013-04-19 DIAGNOSIS — E118 Type 2 diabetes mellitus with unspecified complications: Secondary | ICD-10-CM

## 2013-04-19 DIAGNOSIS — E1121 Type 2 diabetes mellitus with diabetic nephropathy: Secondary | ICD-10-CM

## 2013-04-19 DIAGNOSIS — E113513 Type 2 diabetes mellitus with proliferative diabetic retinopathy with macular edema, bilateral: Secondary | ICD-10-CM | POA: Insufficient documentation

## 2013-04-19 DIAGNOSIS — J309 Allergic rhinitis, unspecified: Secondary | ICD-10-CM

## 2013-04-19 DIAGNOSIS — E1129 Type 2 diabetes mellitus with other diabetic kidney complication: Secondary | ICD-10-CM

## 2013-04-19 DIAGNOSIS — J302 Other seasonal allergic rhinitis: Secondary | ICD-10-CM

## 2013-04-19 DIAGNOSIS — I10 Essential (primary) hypertension: Secondary | ICD-10-CM

## 2013-04-19 LAB — BASIC METABOLIC PANEL WITH GFR
BUN: 16 mg/dL (ref 6–23)
CO2: 30 mEq/L (ref 19–32)
Calcium: 9 mg/dL (ref 8.4–10.5)
Chloride: 106 mEq/L (ref 96–112)
Creat: 0.65 mg/dL (ref 0.50–1.10)
GFR, Est African American: >89 mL/min
GFR, Est Non African American: >89 mL/min
Glucose, Bld: 113 mg/dL — ABNORMAL HIGH (ref 70–99)
Potassium: 4.5 mEq/L (ref 3.5–5.3)
Sodium: 142 mEq/L (ref 135–145)

## 2013-04-19 LAB — GLUCOSE, CAPILLARY: Glucose-Capillary: 124 mg/dL — ABNORMAL HIGH (ref 70–99)

## 2013-04-19 LAB — POCT GLYCOSYLATED HEMOGLOBIN (HGB A1C): Hemoglobin A1C: 6.9

## 2013-04-19 MED ORDER — OMEPRAZOLE 40 MG PO CPDR: 40.0000 mg | DELAYED_RELEASE_CAPSULE | Freq: Every day | ORAL | Status: DC

## 2013-04-19 MED ORDER — LISINOPRIL 20 MG PO TABS: 20.0000 mg | ORAL_TABLET | Freq: Every day | ORAL | Status: DC

## 2013-04-19 NOTE — Assessment & Plan Note (Signed)
Patient's symptoms are not controlled with Loratadine but patient reports she has not enough money to buy any further medication. At this point I will just continue with loratadine and reevaluate patient during next office visit.

## 2013-04-19 NOTE — Progress Notes (Signed)
Diabetes Self-Management Education  Visit Number: Follow-up 1  04/19/2013 [name], identified by name and date of birth, is a 62 y.o. female with Type2 diabetes  .      ASSESSMENT  Patient Concerns:     Lab Results: LDL Cholesterol  Date Value Range Status  02/14/2013 123* 0 - 99 mg/dL Final           Hemoglobin A1C  Date Value Range Status  04/19/2013 6.9   Final  01/02/2013 15.8* <5.7 % Final     (NOTE)                                                                               Family History  Problem Relation Age of Onset  . Diabetes Mother   . Hypertension Mother   . Aneurysm Father   . Diabetes Sister   . Hypertension Sister   . Kidney disease Sister     One Kidney transplant, one sister on HD   History  Substance Use Topics  . Smoking status: Never Smoker   . Smokeless tobacco: Never Used  . Alcohol Use: No    Assessment comments: patient long term goal is to be able to control her diabetes without insulin.     Diet Recall: oatmeal or cornflakes & banana,  Salad often for lunch, meat,vegetables starch, snacks in evening- peanutbutter and grahams. Drinks fruit juice, water and coffee    Education Topics Reviewed with Patient Today:  Topic Points Discussed  Disease State    Nutrition Management Carbohydrate counting;Reviewed blood glucose goals for pre and post meals and how to evaluate the patients' food intake on their blood glucose level.;Meal timing in regards to the patients' current diabetes medication.;Meal options for control of blood glucose level and chronic complications.  Physical Activity and Exercise Role of exercise on diabetes management, blood pressure control and cardiac health.  Medications    Monitoring Taught/discussed recording of test results and interpretation of SMBG.  Acute Complications  Discussed troublehshooting blood sugars higher than target.  Chronic Complications    Psychosocial Adjustment    Goal Setting     Preconception Care (if applicable)      PATIENTS GOALS   Learning Objective(s):     Goal The patient agrees to:  Nutrition    Physical Activity Exercise 5-7 days per week;15 minutes per day  Medications    Monitoring    Problem Solving    Reducing Risk    Health Coping     Patient Self-Evaluation of Goals (Subsequent Visits)  Goal The patient rates self as meeting goals (% of time)  Nutrition    Physical Activity 25 - 50%  Medications    Monitoring    Problem Solving    Reducing Risk    Health Coping       PERSONALIZED PLAN / SUPPORT  Self-Management Support:  [place] office;Family;CDE visits ______________________________________________________________________  Outcomes  Expected Outcomes:  Demonstrated interest in learning. Expect positive outcomes Self-Care Barriers:  Lack of material resources Education material provided: yes If problems or questions, patient to contact team via:  Phone  Time in: 1130     Time out: 1200  Future DSME appointment: -  2 wks   Plyler, Butch Penny 04/19/2013 4:38 PM

## 2013-04-19 NOTE — Patient Instructions (Addendum)
1. please go to the emergency room as soon as possible if you're experiencing any further chest pain or shortness of breath 2.  Will increase Lisinopril ( blood pressure medication) from 10 mg to 20 mg  3. Start you on a reflux medication ( Omeprazole)

## 2013-04-19 NOTE — Assessment & Plan Note (Signed)
Hemoglobin A1c today is 6.9. Reviewing his CBGs patient is let us are between 78 and 150 before breakfast. At this point I will continue Lantus 12 units daily at bedtime. We can not refer patient to ophthalmology evaluation since patient has no insurance

## 2013-04-19 NOTE — Progress Notes (Signed)
Subjective:   Patient ID: Amanda [name] female   DOB: 1951-11-16 62 y.o.   MRN: [id number]  HPI: Amanda [name] is a 62 y.o. female with past medical history significant as outlined below presented to the clinic for regular office visit.  Chest pain: pressure like , located substernal, it is worse when leaning forward, worse in the morning, experiences in the night especially, uses 2 pillows which somewhat makes a difference. The pain only occurs when sitting or lying. It is a catchy feeling. No symptoms with walking. No SOB. No radiation. The pain has been stable for few months without any change in character.  Leg swelling: worse in the evening. Is somewhat improved. Noted muscle cramps and pain ( 4/7 days, pain is around 8/10)  Blood pressure: Average 140-170/72-98 at home   Allergies: Currently taking Loratadine without any significant relieve. Notes congestion and runny nose. Patient had long history of allergies and had received allergy shot which improved her symptoms  Preventive health maintenance cannot be done at this point since patient has no insurance and she does not want to pay out of pocket.  Past Medical History  Diagnosis Date  . Diabetes mellitus   . Hypotension   . Influenza A (H1N1) 01/03/2013  . DKA (diabetic ketoacidoses) 01/02/2013  . Anemia   . SYNCOPE 05/10/2010    Qualifier: Diagnosis of  By: Marilynne Halsted RN, BSN, Jacquelyn     Current Outpatient Prescriptions  Medication Sig Dispense Refill  . acetaminophen (TYLENOL) 325 MG tablet Take 2 tablets (650 mg total) by mouth every 6 (six) hours as needed (or Fever >/= 101).  60 tablet  0  . aspirin 81 MG tablet Take 81 mg by mouth daily.      . Blood Glucose Monitoring Suppl (RELION PRIME MONITOR) DEVI 1 Device by Does not apply route 3 (three) times daily with meals as needed.  1 Device  0  . glucose blood (RELION PRIME TEST) test strip Use as instructed  100 each  12  . insulin glargine (LANTUS) 100 UNIT/ML  injection Inject 12 Units into the skin at bedtime.  10 mL  3  . lisinopril (PRINIVIL,ZESTRIL) 20 MG tablet Take 1 tablet (20 mg total) by mouth daily.  30 tablet  2  . loratadine (CLARITIN) 10 MG tablet Take 1 tablet (10 mg total) by mouth daily.  30 tablet  1  . omeprazole (PRILOSEC) 40 MG capsule Take 1 capsule (40 mg total) by mouth daily.  30 capsule  0  . pravastatin (PRAVACHOL) 20 MG tablet Take 1 tablet (20 mg total) by mouth daily.  30 tablet  2   No current facility-administered medications for this visit.   Family History  Problem Relation Age of Onset  . Diabetes Mother   . Hypertension Mother   . Aneurysm Father   . Diabetes Sister   . Hypertension Sister   . Kidney disease Sister     One Kidney transplant, one sister on HD   History   Social History  . Marital Status: Married    Spouse Name: N/A    Number of Children: N/A  . Years of Education: N/A   Social History Main Topics  . Smoking status: Never Smoker   . Smokeless tobacco: Never Used  . Alcohol Use: No  . Drug Use: No  . Sexually Active: None   Other Topics Concern  . None   Social History Narrative  . None   Review of Systems: Constitutional:  Denies fever, chills, diaphoresis, appetite change and fatigue.  HEENT: Noted  congestion, rhinorrhea, sneezing but denies mouth sores, trouble swallowing, neck pain,  Respiratory: Denies SOB, DOE, cough, chest tightness,  and wheezing.   Cardiovascular: Noted chest pain, palpitations and leg swelling.  Gastrointestinal: Denies nausea, vomiting, abdominal pain,  blood in stool and abdominal distention but noted episode of diarrhea and constipation.  Genitourinary: Denies dysuria, urgency, frequency, hematuria, flank pain and difficulty urinating.  Endocrine: Denies: hot or cold intolerance, sweats, changes in hair or nails, polyuria, polydipsia. Skin: Denies pallor, rash and wound.  Neurological: Denies dizziness but noted headaches.    Objective:   Physical Exam: Filed Vitals:   04/19/13 0931 04/19/13 1038  BP: 194/96 169/94  Pulse: 93 87  Temp: 97 F (36.1 C)   TempSrc: Oral   Height: 5\' 3"  (1.6 m)   Weight: 143 lb 4.8 oz (65 kg)   SpO2: 98%    Constitutional: Vital signs reviewed.  Patient is a well-developed and well-nourished female in no acute distress and cooperative with exam. Alert and oriented x3.  Mouth: no erythema or exudates, MMM Eyes: PERRL, EOMI, conjunctivae normal, No scleral icterus.  Neck: Supple,   Cardiovascular: RRR, S1 normal, S2 normal, no MRG, pulses symmetric and intact bilaterally. Pain reproducible with forward bending and palpation  Pulmonary/Chest: CTAB, no wheezes, rales, or rhonchi Abdominal: Soft. Non-tender, non-distended, bowel sounds are normal,  Hematology: no cervical adenopathy.  Neurological: A&O x3, Strength is normal and symmetric bilaterally, cranial nerve II-XII are grossly intact, no focal motor deficit, sensory intact to light touch bilaterally.  Skin: Warm, dry and intact. No rash, cyanosis, or clubbing.

## 2013-04-19 NOTE — Assessment & Plan Note (Signed)
Unclear etiology. EKG was obtained which did not show any significant changes. Patient's symptoms are very atypical. No exertional pain present. But patient has high risk for coronary artery disease. Considering [place] patient has the 25% risk of coronary disease in 10 years. The key is controlling risk factors including diabetes, hypertension and hyperlipidemia but patient should be evaluated with exercise stress test as an outpatient as soon as possible. Unfortunately she has no insurance at this point. I advised patient if she is experiencing any further chest pain she needs to be evaluated in the ED. Other differential diagnosis include GERD considering symptoms mostly experiencing during the night when lying flat or bending forward. Will start patient on a PPI and reevaluate patient in 2 weeks.

## 2013-04-19 NOTE — Assessment & Plan Note (Signed)
Blood pressure significantly elevated today. Along with significant proteinuria I'll increase lisinopril 20 mg daily. I will also obtain a basic metabolic panel today. I will reevaluate patient in 2 weeks to

## 2013-04-19 NOTE — Patient Instructions (Addendum)
.  Please make a follow up appointment for 3-4 weeks  Your goal is to walk once a day for the next few weeks.   We talked about increased fiber, healthy fats to help blood sugar, cholesterol and kidneys!

## 2013-04-25 NOTE — Progress Notes (Signed)
Case discussed with Dr. Newt Lukes immediately after the resident saw the patient. We reviewed the resident's history and exam and pertinent patient test results. I agree with the assessment, diagnosis and plan of care documented in the resident's note.

## 2013-05-07 ENCOUNTER — Ambulatory Visit: Payer: Self-pay | Admitting: Internal Medicine

## 2013-05-20 ENCOUNTER — Ambulatory Visit (INDEPENDENT_AMBULATORY_CARE_PROVIDER_SITE_OTHER): Payer: Self-pay | Admitting: Internal Medicine

## 2013-05-20 VITALS — BP 151/82 | HR 86 | Temp 98.2°F | Ht 62.5 in | Wt 148.3 lb

## 2013-05-20 DIAGNOSIS — E1149 Type 2 diabetes mellitus with other diabetic neurological complication: Secondary | ICD-10-CM

## 2013-05-20 DIAGNOSIS — R079 Chest pain, unspecified: Secondary | ICD-10-CM

## 2013-05-20 DIAGNOSIS — M2540 Effusion, unspecified joint: Secondary | ICD-10-CM

## 2013-05-20 DIAGNOSIS — E1140 Type 2 diabetes mellitus with diabetic neuropathy, unspecified: Secondary | ICD-10-CM

## 2013-05-20 DIAGNOSIS — E1142 Type 2 diabetes mellitus with diabetic polyneuropathy: Secondary | ICD-10-CM

## 2013-05-20 DIAGNOSIS — I10 Essential (primary) hypertension: Secondary | ICD-10-CM

## 2013-05-20 HISTORY — DX: Type 2 diabetes mellitus with diabetic neuropathy, unspecified: E11.40

## 2013-05-20 LAB — BASIC METABOLIC PANEL WITH GFR
BUN: 15 mg/dL (ref 6–23)
CO2: 29 mEq/L (ref 19–32)
Calcium: 8.8 mg/dL (ref 8.4–10.5)
Chloride: 105 mEq/L (ref 96–112)
Creat: 0.84 mg/dL (ref 0.50–1.10)
GFR, Est African American: 87 mL/min
GFR, Est Non African American: 76 mL/min
Glucose, Bld: 223 mg/dL — ABNORMAL HIGH (ref 70–99)
Potassium: 4.3 mEq/L (ref 3.5–5.3)
Sodium: 142 mEq/L (ref 135–145)

## 2013-05-20 MED ORDER — AMITRIPTYLINE HCL 50 MG PO TABS: 25.0000 mg | ORAL_TABLET | Freq: Every day | ORAL | Status: DC

## 2013-05-20 MED ORDER — LISINOPRIL 40 MG PO TABS: 40.0000 mg | ORAL_TABLET | Freq: Every day | ORAL | Status: DC

## 2013-05-20 NOTE — Assessment & Plan Note (Signed)
Patient reports that her chest pain has almost resolved after starting omeprazole. She is no longer experience of chest discomfort. We'll continue to monitor at this point.

## 2013-05-20 NOTE — Assessment & Plan Note (Signed)
Leg pain most likely due to diabetic neuropathy. I'll start patient on amitriptyline 25 mg daily at bedtime. Reevaluate patient in the next office visit.

## 2013-05-20 NOTE — Assessment & Plan Note (Signed)
Blood pressure continues to be elevated. I will increase lisinopril from 20 mg to 40 mg daily. Reevaluate patient in 2 weeks. Will obtain a basic metabolic panel today to monitor renal function and electrolytes.

## 2013-05-20 NOTE — Progress Notes (Signed)
Subjective:   Patient ID: Amanda [name] female   DOB: [birth year] 62 y.o.   MRN: [id number]  HPI: Amanda [name] is a 62 y.o. female with past medical history as outlined below who presented to the clinic for followup. Patient was evaluated on 04/19/13 for elevated blood pressure. Her lisinopril was increased to 20 mg daily. Patient noted that she has been doing fine. She definitely noticed that her blood pressure had improved since being on lisinopril 20 mg daily. Main concern today is that she continues to have numbness and tingling and pain in her legs as well as pain in her hands. The hands are stiff for a few hours in the morning . Her joints are swollen especially the MCP and PIP and wrist. Whenever she took ibuprofen for symptoms improved. She denies any rashes, fevers, any other joint involvement.    Past Medical History  Diagnosis Date  . Diabetes mellitus   . Hypotension   . Influenza A (H1N1) 01/03/2013  . DKA (diabetic ketoacidoses) 01/02/2013  . Anemia   . SYNCOPE 05/10/2010    Qualifier: Diagnosis of  By: Marilynne Halsted RN, BSN, Jacquelyn     Current Outpatient Prescriptions  Medication Sig Dispense Refill  . acetaminophen (TYLENOL) 325 MG tablet Take 2 tablets (650 mg total) by mouth every 6 (six) hours as needed (or Fever >/= 101).  60 tablet  0  . aspirin 81 MG tablet Take 81 mg by mouth daily.      . Blood Glucose Monitoring Suppl (RELION PRIME MONITOR) DEVI 1 Device by Does not apply route 3 (three) times daily with meals as needed.  1 Device  0  . glucose blood (RELION PRIME TEST) test strip Use as instructed  100 each  12  . insulin glargine (LANTUS) 100 UNIT/ML injection Inject 12 Units into the skin at bedtime.  10 mL  3  . lisinopril (PRINIVIL,ZESTRIL) 20 MG tablet Take 1 tablet (20 mg total) by mouth daily.  30 tablet  2  . loratadine (CLARITIN) 10 MG tablet Take 1 tablet (10 mg total) by mouth daily.  30 tablet  1  . omeprazole (PRILOSEC) 40 MG capsule Take 1 capsule  (40 mg total) by mouth daily.  30 capsule  0  . pravastatin (PRAVACHOL) 20 MG tablet Take 1 tablet (20 mg total) by mouth daily.  30 tablet  2   No current facility-administered medications for this visit.   Family History  Problem Relation Age of Onset  . Diabetes Mother   . Hypertension Mother   . Aneurysm Father   . Diabetes Sister   . Hypertension Sister   . Kidney disease Sister     One Kidney transplant, one sister on HD   History   Social History  . Marital Status: Married    Spouse Name: N/A    Number of Children: N/A  . Years of Education: N/A   Social History Main Topics  . Smoking status: Never Smoker   . Smokeless tobacco: Never Used  . Alcohol Use: No  . Drug Use: No  . Sexually Active: Not on file   Other Topics Concern  . Not on file   Social History Narrative  . No narrative on file   Review of Systems: Constitutional: Denies fever, chills, diaphoresis, appetite change and fatigue.  Respiratory: Denies SOB, DOE, cough, chest tightness,  and wheezing.   Cardiovascular: Denies chest pain, palpitations and leg swelling.  Gastrointestinal: Denies nausea, vomiting, abdominal pain, diarrhea, constipation,  blood in stool and abdominal distention.  Genitourinary: Denies dysuria, urgency, frequency, hematuria, flank pain and difficulty urinating.  Endocrine: Denies: hot or cold intolerance, sweats, changes in hair or nails, polyuria, polydipsia. Musculoskeletal: Noted joint swelling, arthralgias  Skin: Denies pallor, rash and wound.  Neurological: Denies dizziness,  weakness, light-headedness,  and headaches.  Hematological: Denies adenopathy.  Objective:  Physical Exam: Filed Vitals:   05/20/13 1437  BP: 151/82  Pulse: 86  Temp: 98.2 F (36.8 C)  TempSrc: Oral  Height: 5' 2.5" (1.588 m)  Weight: 148 lb 4.8 oz (67.268 kg)  SpO2: 97%   Constitutional: Vital signs reviewed.  Patient is a well-developed and well-nourished female  in no acute distress  and cooperative with exam. Alert and oriented x3.  Eyes: PERRL, EOMI, conjunctivae normal, No scleral icterus.  Neck: Supple,  Cardiovascular: RRR, S1 normal, S2 normal, no MRG, pulses symmetric and intact bilaterally, no edema  Pulmonary/Chest: normal respiratory effort, CTAB, no wheezes, rales, or rhonchi Abdominal: Soft. Non-tender, non-distended, bowel sounds are normal,  Musculoskeletal: Mild swelling and tenderness of MCP and  PIP joint bilaterally. Normal ROM. No erythema. No joint deformities  Hematology: no cervical, inginal, or axillary adenopathy.  Neurological: A&O x3, Strength is normal and symmetric bilaterally, cranial nerve II-XII are grossly intact, no focal motor deficit, sensory intact to light touch bilaterally.  Skin: Warm, dry and intact. No rash, cyanosis, or clubbing.

## 2013-05-20 NOTE — Patient Instructions (Signed)
1. Please increase lisinopril from 20 mg to 40 mg daily 2. I will start you on amitriptyline which is for your leg pain. Please take it before bedtime

## 2013-05-20 NOTE — Assessment & Plan Note (Signed)
E. and likely inflammatory in origin. Unsure when patient will get her insurance. I will obtain today ANA, rheumatoid factor, ESR and CRP. Await renal function to consider restarting nonsteroidal on a regular basis.

## 2013-05-21 LAB — RHEUMATOID FACTOR: Rhuematoid fact SerPl-aCnc: <10 IU/mL (ref <=14)

## 2013-05-21 LAB — C-REACTIVE PROTEIN: CRP: <0.5 mg/dL (ref <0.60)

## 2013-05-21 LAB — SEDIMENTATION RATE: Sed Rate: 1 mm/hr (ref 0–22)

## 2013-05-21 LAB — ANA: Anti Nuclear Antibody(ANA): NEGATIVE

## 2013-05-24 NOTE — Progress Notes (Signed)
TEACHING ATTENDING ADDENDUM: I discussed this case with Dr. Newt Lukes at the time of the patient visit. I agree with the HPI, exam findings and have read the documentation provided by the resident,  and I concur with the plan of care. Please see the resident note for details of management.

## 2013-06-03 ENCOUNTER — Encounter: Payer: Self-pay | Admitting: Internal Medicine

## 2013-06-03 ENCOUNTER — Ambulatory Visit (INDEPENDENT_AMBULATORY_CARE_PROVIDER_SITE_OTHER): Payer: Self-pay | Admitting: Internal Medicine

## 2013-06-03 VITALS — BP 128/74 | HR 86 | Temp 98.2°F | Ht 62.5 in | Wt 147.3 lb

## 2013-06-03 DIAGNOSIS — M2540 Effusion, unspecified joint: Secondary | ICD-10-CM

## 2013-06-03 DIAGNOSIS — Z299 Encounter for prophylactic measures, unspecified: Secondary | ICD-10-CM | POA: Insufficient documentation

## 2013-06-03 DIAGNOSIS — E1142 Type 2 diabetes mellitus with diabetic polyneuropathy: Secondary | ICD-10-CM

## 2013-06-03 DIAGNOSIS — E1149 Type 2 diabetes mellitus with other diabetic neurological complication: Secondary | ICD-10-CM

## 2013-06-03 DIAGNOSIS — Z0000 Encounter for general adult medical examination without abnormal findings: Secondary | ICD-10-CM

## 2013-06-03 DIAGNOSIS — E1140 Type 2 diabetes mellitus with diabetic neuropathy, unspecified: Secondary | ICD-10-CM

## 2013-06-03 DIAGNOSIS — I10 Essential (primary) hypertension: Secondary | ICD-10-CM

## 2013-06-03 MED ORDER — GABAPENTIN 100 MG PO CAPS: 100.0000 mg | ORAL_CAPSULE | Freq: Three times a day (TID) | ORAL | Status: DC

## 2013-06-03 NOTE — Assessment & Plan Note (Addendum)
History, clinical exam, and labs point more toward non-inflammatory arthritis, likely OA. No evidence of active synovitis or effusions today. Renal func is stable. Recommend ibuprofen 600mg  TID prn pain. Return to clinic if any erythema/effusions. May need plain films of hands if sx do not improve. Clinically no indication for further imaging, lab work up, or referral at this time.

## 2013-06-03 NOTE — Assessment & Plan Note (Signed)
Due for multiple HC maintenance today: tetanus, MMG, colonoscopy, eye exam.  Pt says she has meeting w financial counselor, prefers to defer nonurgent medical work up until that time. Recommend f/u appt in 6-8 weeks for HM.

## 2013-06-03 NOTE — Assessment & Plan Note (Signed)
BP Readings from Last 3 Encounters:  06/03/13 128/74  05/20/13 151/82  04/19/13 169/94    Lab Results  Component Value Date   NA 142 05/20/2013   K 4.3 05/20/2013   CREATININE 0.84 05/20/2013    Assessment: Blood pressure control: controlled Progress toward BP goal:  at goal Comments:At last visit increased lisinopril from 20-->40mg  daily  Plan: Medications:  continue current medications

## 2013-06-03 NOTE — Patient Instructions (Addendum)
1. Take gabapentin (neurontin) 1 pill three times a day for your numbness/tingling. If you have side effects you can decrease to 1 or 2 times per day. If you don't get relief, we can increase the dose, please call us. 2. You can take ibuprofen 600-800mg  (3-4) tablets NO MORE than 3 times per day as needed for pain. If you get a red, swollen joint please call us and we will work you into the clinic to examine you.  3. We will see you again once you've finished your orange card application and can refer you for colonoscopy and eye exam then.    Treatment Goals:  Goals (1 Years of Data) as of 06/03/13         As of Today 05/20/13 04/19/13 04/19/13 03/12/13     Blood Pressure    . Blood Pressure < 140/90  128/74 151/82 169/94 194/96 162/88     Result Component    . HEMOGLOBIN A1C < 7.0    6.9      . LDL CALC < 100            Progress Toward Treatment Goals:  Treatment Goal 06/03/2013  Blood pressure at goal    Self Care Goals & Plans:  Self Care Goal 05/20/2013  Manage my medications bring my medications to every visit; take my medicines as prescribed  Monitor my health check my feet daily; keep track of my blood glucose  Eat healthy foods eat foods that are low in salt; eat baked foods instead of fried foods  Be physically active -       Care Management & Community Referrals:  Referral 03/12/2013  Referrals made for care management support diabetes educator

## 2013-06-03 NOTE — Progress Notes (Signed)
I discussed patient with resident Dr. Sandy Salaam, and I agree with the plans as outlined in her note.

## 2013-06-03 NOTE — Assessment & Plan Note (Signed)
Couldn't tolerate amitriptyline 2/2 SE. Will Rx gabapentin 100mg  TID to be titrated up or down prn

## 2013-06-03 NOTE — Progress Notes (Signed)
Patient ID: STARLETTE COHEA, female   DOB: 05-20-1951, 62 y.o.   MRN: [id number]  Subjective:   Patient ID: MAANVI WENTZELL female   DOB: July 13, 1951 62 y.o.   MRN: [id number]  HPI: Ibis Steven Konkel is a 62 y.o. female with history of HTN, DM w neuropathy/nephropathy presenting to the clinic for follow up.  HTN Lisinopril increased from 20-->40mg  daily and she has had much better BP control without SE.  Joint swelling Reported swelling throughout day, AM stiffness in wrists and several MCP and PIP joints. Ibuprofen prn helps. Labs ordered last visit all negative (ANA, CRP, ESR, RF). No recent flares/erythema. No systemic sx (malaise, fevers, chills).  DM neuropathy Amitriptyline helped numbness/tingling but couldn't tolerate sedating SE.     Past Medical History  Diagnosis Date  . Diabetes mellitus   . Hypotension   . Influenza A (H1N1) 01/03/2013  . DKA (diabetic ketoacidoses) 01/02/2013  . Anemia   . SYNCOPE 05/10/2010    Qualifier: Diagnosis of  By: Marilynne Halsted RN, BSN, Jacquelyn     Current Outpatient Prescriptions  Medication Sig Dispense Refill  . acetaminophen (TYLENOL) 325 MG tablet Take 2 tablets (650 mg total) by mouth every 6 (six) hours as needed (or Fever >/= 101).  60 tablet  0  . aspirin 81 MG tablet Take 81 mg by mouth daily.      . Blood Glucose Monitoring Suppl (RELION PRIME MONITOR) DEVI 1 Device by Does not apply route 3 (three) times daily with meals as needed.  1 Device  0  . gabapentin (NEURONTIN) 100 MG capsule Take 1 capsule (100 mg total) by mouth 3 (three) times daily.  90 capsule  2  . glucose blood (RELION PRIME TEST) test strip Use as instructed  100 each  12  . insulin glargine (LANTUS) 100 UNIT/ML injection Inject 12 Units into the skin at bedtime.  10 mL  3  . lisinopril (PRINIVIL,ZESTRIL) 40 MG tablet Take 1 tablet (40 mg total) by mouth daily.  30 tablet  2  . loratadine (CLARITIN) 10 MG tablet Take 1 tablet (10 mg total) by mouth daily.  30 tablet  1  .  omeprazole (PRILOSEC) 40 MG capsule Take 1 capsule (40 mg total) by mouth daily.  30 capsule  0  . pravastatin (PRAVACHOL) 20 MG tablet Take 1 tablet (20 mg total) by mouth daily.  30 tablet  2   No current facility-administered medications for this visit.   Family History  Problem Relation Age of Onset  . Diabetes Mother   . Hypertension Mother   . Aneurysm Father   . Diabetes Sister   . Hypertension Sister   . Kidney disease Sister     One Kidney transplant, one sister on HD   History   Social History  . Marital Status: Married    Spouse Name: N/A    Number of Children: N/A  . Years of Education: N/A   Social History Main Topics  . Smoking status: Never Smoker   . Smokeless tobacco: Never Used  . Alcohol Use: No  . Drug Use: No  . Sexually Active: Not on file   Other Topics Concern  . Not on file   Social History Narrative  . No narrative on file   Review of Systems: 10 pt ROS performed, pertinent positives and negatives noted in HPI Objective:  Physical Exam: Filed Vitals:   06/03/13 1533  BP: 128/74   Vitals reviewed. General: sitting in chair, NAD HEENT:  PERRL, EOMI, no scleral icterus Cardiac: RRR, no rubs, murmurs or gallops Pulm: clear to auscultation bilaterally, no wheezes, rales, or rhonchi Abd: soft, nontender, nondistended, BS present Ext: Examination of wrists, MCP, PIP and DIP joints of bilateral hands revealed no synovitis, erythema, effusions or tenderness. No nodules or deformities. Warm and well perfused, 2+ DP pulses bilaterally, no ulcers or blisters Neuro: alert and oriented X3, cranial nerves II-XII grossly intact, strength and sensation to light touch equal in bilateral upper and lower extremities  Assessment & Plan:   Please see problem-based charting for assessment and plan.

## 2013-06-17 ENCOUNTER — Ambulatory Visit: Payer: Self-pay

## 2013-06-20 ENCOUNTER — Other Ambulatory Visit: Payer: Self-pay | Admitting: Internal Medicine

## 2013-06-27 ENCOUNTER — Other Ambulatory Visit: Payer: Self-pay

## 2013-07-02 ENCOUNTER — Ambulatory Visit: Payer: Self-pay

## 2013-07-29 ENCOUNTER — Encounter: Payer: Self-pay | Admitting: Internal Medicine

## 2013-07-29 ENCOUNTER — Ambulatory Visit: Payer: Self-pay

## 2013-08-05 ENCOUNTER — Encounter: Payer: Self-pay | Admitting: Internal Medicine

## 2013-08-30 ENCOUNTER — Ambulatory Visit: Payer: Self-pay

## 2013-09-06 ENCOUNTER — Ambulatory Visit: Payer: Self-pay

## 2013-09-09 ENCOUNTER — Encounter: Payer: Self-pay | Admitting: Internal Medicine

## 2013-09-09 ENCOUNTER — Ambulatory Visit (INDEPENDENT_AMBULATORY_CARE_PROVIDER_SITE_OTHER): Payer: Self-pay | Admitting: Internal Medicine

## 2013-09-09 VITALS — BP 135/77 | HR 84 | Temp 98.3°F | Ht 62.0 in | Wt 152.3 lb

## 2013-09-09 DIAGNOSIS — I10 Essential (primary) hypertension: Secondary | ICD-10-CM

## 2013-09-09 DIAGNOSIS — E1149 Type 2 diabetes mellitus with other diabetic neurological complication: Secondary | ICD-10-CM

## 2013-09-09 DIAGNOSIS — E1140 Type 2 diabetes mellitus with diabetic neuropathy, unspecified: Secondary | ICD-10-CM

## 2013-09-09 DIAGNOSIS — Z0000 Encounter for general adult medical examination without abnormal findings: Secondary | ICD-10-CM

## 2013-09-09 DIAGNOSIS — IMO0002 Reserved for concepts with insufficient information to code with codable children: Secondary | ICD-10-CM

## 2013-09-09 DIAGNOSIS — E1165 Type 2 diabetes mellitus with hyperglycemia: Secondary | ICD-10-CM

## 2013-09-09 DIAGNOSIS — E1142 Type 2 diabetes mellitus with diabetic polyneuropathy: Secondary | ICD-10-CM

## 2013-09-09 LAB — GLUCOSE, CAPILLARY
Glucose-Capillary: 53 mg/dL — ABNORMAL LOW (ref 70–99)
Glucose-Capillary: 68 mg/dL — ABNORMAL LOW (ref 70–99)
Glucose-Capillary: 168 mg/dL — ABNORMAL HIGH (ref 70–99)

## 2013-09-09 LAB — POCT GLYCOSYLATED HEMOGLOBIN (HGB A1C): Hemoglobin A1C: 5.9

## 2013-09-09 MED ORDER — GABAPENTIN 100 MG PO CAPS: 100.0000 mg | ORAL_CAPSULE | Freq: Three times a day (TID) | ORAL | Status: DC

## 2013-09-09 MED ORDER — INSULIN GLARGINE 100 UNIT/ML ~~LOC~~ SOLN: 8.0000 [IU] | Freq: Every day | SUBCUTANEOUS | Status: DC

## 2013-09-09 NOTE — Assessment & Plan Note (Signed)
Well controlled today, 135/77. Lisinopril increased to 40 mg qd at last clinic visit.

## 2013-09-09 NOTE — Assessment & Plan Note (Signed)
Patient changed from Elavil to Neurontin 100 mg tid at her last clinic visit, however, she never filled the Rx because of cost concerns. She still complains of neuropathic pain. Given a discount coupon for Neurontin an given a paper Rx to take where she desires for the best price.  -Patient is to obtain [employer] in early October.

## 2013-09-09 NOTE — Assessment & Plan Note (Signed)
Given flu shot today. Patient is due for colonoscopy, eye exam, and mammogram, however she does not have health insurance at this time and is scheduled to come back early October to discuss [employer] card with Bonna Gains. -Will refer patient for health maintenance issues at that time. Will see back in 4 weeks.

## 2013-09-09 NOTE — Patient Instructions (Addendum)
General Instructions: 1. Please make a follow up appointment with me in 4 weeks (after you meet with Bonna Gains about the [employer]). 2. Please take all medications as prescribed. Start taking 8 units of Lantus at night instead of 12. Please keep track of your blood sugar for the next month and bring your meter to your next appointment. 3. If you have worsening of your symptoms or new symptoms arise, please call the clinic [phone]), or go to the ER immediately if symptoms are severe.  You have done great job in taking all your medications. I appreciate it very much. Please continue doing that.   Treatment Goals:  Goals (1 Years of Data) as of 09/09/13         As of Today 06/03/13 05/20/13 04/19/13 04/19/13     Blood Pressure    . Blood Pressure < 140/90  135/77 128/74 151/82 169/94 194/96     Result Component    . HEMOGLOBIN A1C < 7.0     6.9     . LDL CALC < 100            Progress Toward Treatment Goals:  Treatment Goal 09/09/2013  Hemoglobin A1C at goal  Blood pressure at goal    Self Care Goals & Plans:  Self Care Goal 09/09/2013  Manage my medications take my medicines as prescribed; refill my medications on time  Monitor my health keep track of my blood glucose  Eat healthy foods -  Be physically active -    Home Blood Glucose Monitoring 09/09/2013  Check my blood sugar 2 times a day  When to check my blood sugar before breakfast; before dinner     Care Management & Community Referrals:  Referral 09/09/2013  Referrals made for care management support none needed

## 2013-09-09 NOTE — Progress Notes (Signed)
Hypoglycemic Event  CBG: 58  Treatment: 15 GM carbohydrate snack  Symptoms: None  Follow-up CBG: Time:4:15 PM CBG Result: 68  Possible Reasons for Event: Inadequate meal intake  Comments/MD notified:   Dr. Fernanda Drum, Trey Sailors  Remember to initiate Hypoglycemia Order Set & complete

## 2013-09-09 NOTE — Progress Notes (Signed)
Patient ID: Amanda [name], female   DOB: 16-Apr-1951, 62 y.o.   MRN: [id number]   Subjective:   Patient ID: Amanda [name] female   DOB: Feb 16, 1951 62 y.o.   MRN: [id number]  HPI: Amanda [name] is a 62 y.o. F w/ PMHx of DM type II (w/ neuropathy), HTN, and dyslipidemia, presents to the clinic today for follow up. She was last seen in the clinic on 06/03/13 for follow up and had her Lisinopril increased to 40 mg qd, which has seemed to be adequate to control her BP. Her last A1c was 6.9 on 04/19/13 and she is currently taking 12 units of Lantus for her DM. Today, her blood sugar was low at 53 and she did report some lightheadedness and tremulousness. She was given some crackers and orange juice and her blood sugar came up to 168 by the end of her clinic visit. She does report many episodes of symptomatic hypoglycemia, with lightheadedness, tremulousness, and dizziness, sometimes once a day. She says she checks her blood sugar usually 2x daily, once in the early morning, which is usually between 70-120. She reports that her blood sugar is never above 220. She claims her eating habits have been less than usual as she has been very busy recently with family issues as her sister just passed away in early September 03, 2023.  Repeat HbA1c during this visit was 5.9. She does still report some neuropathic pain 2/2 diabetic neuropathy, and was switched form Elavil to Neurontin at her last visit, however she was unable to afford this medication at that time as she is not currently insured. She is scheduled to meet with Bonna Gains in early October to discuss obtaining the [facility]. She otherwise has no issues. She denies any SOB, chest pain, nausea, vomiting, diarrhea, or abdominal pain.  Past Medical History  Diagnosis Date  . Diabetes mellitus   . Hypotension   . Influenza A (H1N1) 01/03/2013  . DKA (diabetic ketoacidoses) 01/02/2013  . Anemia   . SYNCOPE 05/10/2010    Qualifier: Diagnosis of  By: Marilynne Halsted RN, BSN,  Jacquelyn     Current Outpatient Prescriptions  Medication Sig Dispense Refill  . acetaminophen (TYLENOL) 325 MG tablet Take 2 tablets (650 mg total) by mouth every 6 (six) hours as needed (or Fever >/= 101).  60 tablet  0  . aspirin 81 MG tablet Take 81 mg by mouth daily.      . Blood Glucose Monitoring Suppl (RELION PRIME MONITOR) DEVI 1 Device by Does not apply route 3 (three) times daily with meals as needed.  1 Device  0  . gabapentin (NEURONTIN) 100 MG capsule Take 1 capsule (100 mg total) by mouth 3 (three) times daily.  90 capsule  2  . glucose blood (RELION PRIME TEST) test strip Use as instructed  100 each  12  . insulin glargine (LANTUS) 100 UNIT/ML injection Inject 12 Units into the skin at bedtime.  10 mL  3  . lisinopril (PRINIVIL,ZESTRIL) 40 MG tablet Take 1 tablet (40 mg total) by mouth daily.  30 tablet  2  . loratadine (CLARITIN) 10 MG tablet Take 1 tablet (10 mg total) by mouth daily.  30 tablet  1  . omeprazole (PRILOSEC) 40 MG capsule Take 1 capsule (40 mg total) by mouth daily.  30 capsule  0  . pravastatin (PRAVACHOL) 20 MG tablet TAKE ONE TABLET BY MOUTH ONCE DAILY  30 tablet  2   No current facility-administered medications for this  visit.   Family History  Problem Relation Age of Onset  . Diabetes Mother   . Hypertension Mother   . Aneurysm Father   . Diabetes Sister   . Hypertension Sister   . Kidney disease Sister     One Kidney transplant, one sister on HD   History   Social History  . Marital Status: Married    Spouse Name: N/A    Number of Children: N/A  . Years of Education: N/A   Social History Main Topics  . Smoking status: Never Smoker   . Smokeless tobacco: Never Used  . Alcohol Use: No  . Drug Use: No  . Sexual Activity: None   Other Topics Concern  . None   Social History Narrative  . None   Review of Systems: General: Denies fever, chills, diaphoresis, appetite change and fatigue.    Respiratory: Denies SOB, DOE, cough, chest  tightness, and wheezing.   Cardiovascular: Positive for infrequent palpitations associated with hypoglycemia. Denies chest pain, palpitations and leg swelling.  Gastrointestinal: Denies nausea, vomiting, abdominal pain, diarrhea, constipation, blood in stool and abdominal distention.  Endocrine: Denies hot or cold intolerance, sweats, polyuria, polydipsia. Musculoskeletal: Denies myalgias, back pain, joint swelling, arthralgias and gait problem.  Skin: Denies pallor, rash and wounds.  Neurological: Positive for lightheadedness, dizziness, and tremulousness, associated with hypoglycemic episodes. Denies seizures, syncope, weakness, numbness and headaches.   Psychiatric/Behavioral: Denies mood changes, confusion, nervousness, sleep disturbance and agitation.   Objective:  Physical Exam: Filed Vitals:   09/09/13 1518  BP: 135/77  Pulse: 84  Temp: 98.3 F (36.8 C)  TempSrc: Oral  Height: 5\' 2"  (1.575 m)  Weight: 152 lb 4.8 oz (69.083 kg)  SpO2: 96%   General: Vital signs reviewed.  Patient is a well-developed and well-nourished, in no acute distress and cooperative with exam. Alert and oriented x3.  Head: Normocephalic and atraumatic. Eyes: PERRL, EOMI, conjunctivae normal, No scleral icterus.  Neck: Supple, trachea midline, normal ROM, No JVD, masses, thyromegaly, or carotid bruit present.  Cardiovascular: RRR, S1 normal, S2 normal, no murmurs, gallops, or rubs. Pulmonary/Chest: normal respiratory effort, CTAB, no wheezes, rales, or rhonchi. Abdominal: Soft. Non-tender, non-distended, bowel sounds are normal, no masses, organomegaly, or guarding present.  Extremities: No swelling or edema,  pulses symmetric and intact bilaterally. No cyanosis or clubbing. Neurological: A&O x3, Strength is normal and symmetric bilaterally, cranial nerve II-XII are grossly intact, no focal motor deficit, sensory intact to light touch bilaterally.  Skin: Warm, dry and intact. No rashes or  erythema. Psychiatric: Normal mood and affect. speech and behavior is normal. Cognition and memory are normal.   Assessment & Plan:   Please see problem-based assessment and plan.

## 2013-09-09 NOTE — Assessment & Plan Note (Addendum)
Patient presented with blood glucose of 53. Given crackers and OJ, increased blood sugar to 168 by the end of her visit. She reports several episodes of hypoglycemic symptoms and reports some recent low blood sugars. She is currently on 12 units of Lantus at night. HbA1c today was 5.9.  -Decreased Lantus to 8 units at night. -Encouraged patient to eat consistent meals as she reports poor intake recently 2/2 family issues as her sister recently passed away. -Will see patient pack in 4 weeks to discuss further regimen alteration.

## 2013-09-12 NOTE — Progress Notes (Signed)
I saw and evaluated the patient.  I personally confirmed the key portions of the history and exam documented by Dr. Jones and I reviewed pertinent patient test results.  The assessment, diagnosis, and plan were formulated together and I agree with the documentation in the resident's note.   

## 2013-09-24 ENCOUNTER — Other Ambulatory Visit: Payer: Self-pay | Admitting: Internal Medicine

## 2013-09-24 DIAGNOSIS — E1165 Type 2 diabetes mellitus with hyperglycemia: Secondary | ICD-10-CM

## 2013-09-27 ENCOUNTER — Ambulatory Visit: Payer: Self-pay

## 2013-10-04 ENCOUNTER — Ambulatory Visit: Payer: Self-pay

## 2013-10-11 ENCOUNTER — Ambulatory Visit: Payer: Self-pay

## 2013-11-22 ENCOUNTER — Ambulatory Visit: Payer: Self-pay

## 2013-11-27 ENCOUNTER — Emergency Department (HOSPITAL_COMMUNITY)
Admission: EM | Admit: 2013-11-27 | Discharge: 2013-11-27 | Disposition: A | Discharge status: Home / Self Care | Payer: Self-pay | Attending: Emergency Medicine | Admitting: Emergency Medicine

## 2013-11-27 ENCOUNTER — Emergency Department (HOSPITAL_COMMUNITY): Payer: Self-pay

## 2013-11-27 ENCOUNTER — Encounter (HOSPITAL_COMMUNITY): Payer: Self-pay | Admitting: Emergency Medicine

## 2013-11-27 DIAGNOSIS — E119 Type 2 diabetes mellitus without complications: Secondary | ICD-10-CM | POA: Insufficient documentation

## 2013-11-27 DIAGNOSIS — I10 Essential (primary) hypertension: Secondary | ICD-10-CM | POA: Insufficient documentation

## 2013-11-27 DIAGNOSIS — Z794 Long term (current) use of insulin: Secondary | ICD-10-CM | POA: Insufficient documentation

## 2013-11-27 DIAGNOSIS — S060X9A Concussion with loss of consciousness of unspecified duration, initial encounter: Secondary | ICD-10-CM | POA: Insufficient documentation

## 2013-11-27 DIAGNOSIS — W1809XA Striking against other object with subsequent fall, initial encounter: Secondary | ICD-10-CM | POA: Insufficient documentation

## 2013-11-27 DIAGNOSIS — Z7982 Long term (current) use of aspirin: Secondary | ICD-10-CM | POA: Insufficient documentation

## 2013-11-27 DIAGNOSIS — Z862 Personal history of diseases of the blood and blood-forming organs and certain disorders involving the immune mechanism: Secondary | ICD-10-CM | POA: Insufficient documentation

## 2013-11-27 DIAGNOSIS — E785 Hyperlipidemia, unspecified: Secondary | ICD-10-CM | POA: Insufficient documentation

## 2013-11-27 DIAGNOSIS — Z79899 Other long term (current) drug therapy: Secondary | ICD-10-CM | POA: Insufficient documentation

## 2013-11-27 DIAGNOSIS — Y9289 Other specified places as the place of occurrence of the external cause: Secondary | ICD-10-CM | POA: Insufficient documentation

## 2013-11-27 DIAGNOSIS — W010XXA Fall on same level from slipping, tripping and stumbling without subsequent striking against object, initial encounter: Secondary | ICD-10-CM | POA: Insufficient documentation

## 2013-11-27 DIAGNOSIS — Z8619 Personal history of other infectious and parasitic diseases: Secondary | ICD-10-CM | POA: Insufficient documentation

## 2013-11-27 DIAGNOSIS — W19XXXA Unspecified fall, initial encounter: Secondary | ICD-10-CM

## 2013-11-27 DIAGNOSIS — Y9389 Activity, other specified: Secondary | ICD-10-CM | POA: Insufficient documentation

## 2013-11-27 HISTORY — DX: Hyperlipidemia, unspecified: E78.5

## 2013-11-27 HISTORY — DX: Essential (primary) hypertension: I10

## 2013-11-27 LAB — GLUCOSE, CAPILLARY: Glucose-Capillary: 62 mg/dL — ABNORMAL LOW (ref 70–99)

## 2013-11-27 MED ORDER — OXYCODONE-ACETAMINOPHEN 5-325 MG PO TABS: 1.0000 | ORAL_TABLET | Freq: Four times a day (QID) | ORAL | Status: DC | PRN

## 2013-11-27 MED ORDER — OXYCODONE-ACETAMINOPHEN 5-325 MG PO TABS
1.0000 | ORAL_TABLET | Freq: Once | ORAL | Status: AC
  Administered 2013-11-27: 1 via ORAL
  Filled 2013-11-27: qty 1

## 2013-11-27 NOTE — ED Provider Notes (Signed)
CSN: [id number]     Arrival date & time 11/27/13  0848 History   First MD Initiated Contact with Patient 11/27/13 0913     Chief Complaint  Patient presents with  . Fall  . Headache   (Consider location/radiation/quality/duration/timing/severity/associated sxs/prior Treatment) Patient is a 62 y.o. female presenting with fall and headaches. The history is provided by the patient.  Fall This is a new problem. The current episode started 1 to 2 hours ago. Episode frequency: once. The problem has been resolved. Associated symptoms include headaches. Pertinent negatives include no chest pain, no abdominal pain and no shortness of breath. Nothing aggravates the symptoms. Nothing relieves the symptoms. She has tried nothing for the symptoms. The treatment provided no relief.  Headache Pain location:  L parietal Quality:  Dull Radiates to:  Does not radiate Severity currently:  9/10 Severity at highest:  9/10 Onset quality:  Sudden Duration:  2 hours Timing:  Constant Progression:  Unchanged Chronicity:  New Context comment:  Fell and hit head Relieved by:  Nothing Worsened by:  Nothing tried Ineffective treatments:  None tried Associated symptoms: no abdominal pain, no back pain, no congestion, no cough, no diarrhea, no dizziness, no pain, no fatigue, no fever, no nausea, no neck pain and no vomiting     Past Medical History  Diagnosis Date  . Diabetes mellitus   . Hypotension   . Influenza A (H1N1) 01/03/2013  . DKA (diabetic ketoacidoses) 01/02/2013  . Anemia   . SYNCOPE 05/10/2010    Qualifier: Diagnosis of  By: Marilynne Halsted, RN, BSN, Jacquelyn    . Hypertension   . Hyperlipidemia    Past Surgical History  Procedure Laterality Date  . Rotator cuff repair    . Abdominal hysterectomy      10 years ago   . Shoulder surgery      Rotator cuff tear    Family History  Problem Relation Age of Onset  . Diabetes Mother   . Hypertension Mother   . Aneurysm Father   . Diabetes Sister    . Hypertension Sister   . Kidney disease Sister     One Kidney transplant, one sister on HD   History  Substance Use Topics  . Smoking status: Never Smoker   . Smokeless tobacco: Never Used  . Alcohol Use: No   OB History   Grav Para Term Preterm Abortions TAB SAB Ect Mult Living                 Review of Systems  Constitutional: Negative for fever and fatigue.  HENT: Negative for congestion and drooling.   Eyes: Negative for pain.  Respiratory: Negative for cough and shortness of breath.   Cardiovascular: Negative for chest pain.  Gastrointestinal: Negative for nausea, vomiting, abdominal pain and diarrhea.  Genitourinary: Negative for dysuria and hematuria.  Musculoskeletal: Negative for back pain, gait problem and neck pain.  Skin: Negative for color change.  Neurological: Positive for headaches. Negative for dizziness.  Hematological: Negative for adenopathy.  Psychiatric/Behavioral: Negative for behavioral problems.  All other systems reviewed and are negative.    Allergies  Review of patient's allergies indicates no known allergies.  Home Medications   Current Outpatient Rx  Name  Route  Sig  Dispense  Refill  . acetaminophen (TYLENOL) 325 MG tablet   Oral   Take 2 tablets (650 mg total) by mouth every 6 (six) hours as needed (or Fever >/= 101).   60 tablet   0   .  aspirin 81 MG tablet   Oral   Take 81 mg by mouth daily.         [name] gabapentin (NEURONTIN) 100 MG capsule   Oral   Take 1 capsule (100 mg total) by mouth 3 (three) times daily.   90 capsule   2   . ibuprofen (ADVIL,MOTRIN) 200 MG tablet   Oral   Take 400 mg by mouth every 6 (six) hours as needed.         . insulin glargine (LANTUS) 100 UNIT/ML injection   Subcutaneous   Inject 0.08 mLs (8 Units total) into the skin at bedtime.   10 mL   3   . lisinopril (PRINIVIL,ZESTRIL) 40 MG tablet   Oral   Take 1 tablet (40 mg total) by mouth daily.   30 tablet   2   . omeprazole  (PRILOSEC) 40 MG capsule   Oral   Take 1 capsule (40 mg total) by mouth daily.   30 capsule   0   . pravastatin (PRAVACHOL) 20 MG tablet   Oral   Take 20 mg by mouth daily.         . Probiotic Product (ALIGN) 4 MG CAPS   Oral   Take 1 capsule by mouth daily.          BP 205/87  Pulse 93  Temp(Src) 98.5 F (36.9 C) (Oral)  Resp 16  SpO2 100% Physical Exam  Nursing note and vitals reviewed. Constitutional: She is oriented to person, place, and time. She appears well-developed and well-nourished.  HENT:  Head: Normocephalic.  Mouth/Throat: No oropharyngeal exudate.  Eyes: Conjunctivae and EOM are normal. Pupils are equal, round, and reactive to light.  Neck: Normal range of motion. Neck supple.   No focal vertebral tenderness to palpation.  Cardiovascular: Normal rate, regular rhythm, normal heart sounds and intact distal pulses.  Exam reveals no gallop and no friction rub.   No murmur heard. Pulmonary/Chest: Effort normal and breath sounds normal. No respiratory distress. She has no wheezes.  Abdominal: Soft. Bowel sounds are normal. There is no tenderness. There is no rebound and no guarding.  Musculoskeletal: Normal range of motion. She exhibits no edema and no tenderness.  Neurological: She is alert and oriented to person, place, and time. She has normal strength. No cranial nerve deficit or sensory deficit. She displays a negative Romberg sign. Coordination and gait normal.  Normal finger to nose bilaterally.  Skin: Skin is warm and dry.  Psychiatric: She has a normal mood and affect. Her behavior is normal.    ED Course  Procedures (including critical care time) Labs Review Labs Reviewed  GLUCOSE, CAPILLARY - Abnormal; Notable for the following:    Glucose-Capillary 62 (*)    All other components within normal limits   Imaging Review Ct Head Wo Contrast  11/27/2013   CLINICAL DATA:  Headache, slipped and fell, reported loss of consciousness for several  minutes  EXAM: CT HEAD WITHOUT CONTRAST  TECHNIQUE: Contiguous axial images were obtained from the base of the skull through the vertex without intravenous contrast.  COMPARISON:  04/10/2010  FINDINGS: No acute hemorrhage, infarct, or mass lesion is identified. No midline shift. No ventriculomegaly. Orbits and paranasal sinuses are intact. No skull fracture.  IMPRESSION: Normal head CT.   Electronically Signed   By: Conchita Paris M.D.   On: 11/27/2013 10:21    EKG Interpretation   None       MDM   1. Fall,  initial encounter   2. Concussion, with loss of consciousness of unspecified duration, initial encounter    9:25 AM 62 y.o. female who presents with a mechanical fall which occurred at approximately 8 AM this morning. The patient states that she was rushing down a wooden ramp when she slipped backwards hitting the occipital area of her head. She believes that she had loss of consciousness for several seconds. She complains of a 9/10 headache currently on exam. She is afebrile and hypertensive here. She cannot remember whether she took her medications this morning. Will get a CT of her head, pain control, and continue to monitor blood pressure.  11:05 AM: CT imaging non-contrib. Pt continues to appear well, HA decreasing. She is ambulatory on exam. Likely mild concussion.  I have discussed the diagnosis/risks/treatment options with the patient and believe the pt to be eligible for discharge home to follow-up with pcp as needed. We also discussed returning to the ED immediately if new or worsening sx occur. We discussed the sx which are most concerning (e.g., worsening pain, AMS) that necessitate immediate return. Any new prescriptions provided to the patient are listed below.  New Prescriptions   OXYCODONE-ACETAMINOPHEN (PERCOCET) 5-325 MG PER TABLET    Take 1 tablet by mouth every 6 (six) hours as needed for moderate pain.       Blanchard Kelch, MD 11/27/13 1115

## 2013-11-27 NOTE — Progress Notes (Signed)
[facility] did not get to see patient but will be sending information about a [employer] the address provided.

## 2013-11-27 NOTE — ED Notes (Signed)
Pt c/o headache and generalized soreness after slipping and falling this morning.  Pain score 5/10.  Pt sts "I was rushing, slipped, fell, and hit my head on my wooden ramp."  Sts LOC for "a few minutes."  Denies blood thinners.  Pt's sister sts Pt has returned to neuro baseline.

## 2013-11-29 ENCOUNTER — Ambulatory Visit: Payer: Self-pay

## 2013-12-02 ENCOUNTER — Ambulatory Visit (INDEPENDENT_AMBULATORY_CARE_PROVIDER_SITE_OTHER): Payer: Self-pay | Admitting: Internal Medicine

## 2013-12-02 ENCOUNTER — Encounter: Payer: Self-pay | Admitting: Internal Medicine

## 2013-12-02 VITALS — BP 172/91 | HR 81 | Temp 97.8°F | Ht 62.0 in | Wt 153.8 lb

## 2013-12-02 DIAGNOSIS — I10 Essential (primary) hypertension: Secondary | ICD-10-CM

## 2013-12-02 DIAGNOSIS — T148XXA Other injury of unspecified body region, initial encounter: Secondary | ICD-10-CM

## 2013-12-02 DIAGNOSIS — J309 Allergic rhinitis, unspecified: Secondary | ICD-10-CM

## 2013-12-02 DIAGNOSIS — E1165 Type 2 diabetes mellitus with hyperglycemia: Secondary | ICD-10-CM

## 2013-12-02 DIAGNOSIS — J302 Other seasonal allergic rhinitis: Secondary | ICD-10-CM

## 2013-12-02 LAB — GLUCOSE, CAPILLARY
Glucose-Capillary: 55 mg/dL — ABNORMAL LOW (ref 70–99)
Glucose-Capillary: 137 mg/dL — ABNORMAL HIGH (ref 70–99)

## 2013-12-02 MED ORDER — MOMETASONE FUROATE 50 MCG/ACT NA SUSP: 2.0000 | Freq: Every day | NASAL | Status: DC

## 2013-12-02 NOTE — Patient Instructions (Addendum)
-  Please take OTC pain medication such as tylenol, ibuprofen, or alieve -Use hot compresses over your tense muscles -Please discontinue taking Lantus  -Check you sugars before breakfast and dinner and bring your meter at next visit -Will prescribe you nasonex for allergies  -Will see you back in 2 weeks

## 2013-12-04 DIAGNOSIS — T148XXA Other injury of unspecified body region, initial encounter: Secondary | ICD-10-CM | POA: Insufficient documentation

## 2013-12-04 NOTE — Assessment & Plan Note (Addendum)
Assessment: Pt with uncontrolled allergic rhinitis with rhinorrhea unrelieved with OTC anti-histamines who presents without symptoms suggestive of URI or sinusitis.   Plan -Prescribe mometasone  2 sprays into each nostril daily (most effective mediation for allergic rhinitis)   -Pt instructed to avoid known allergens -Continue to monitor

## 2013-12-04 NOTE — Assessment & Plan Note (Addendum)
Assessment: Pt with uncontrolled hypertension who is compliant with one class anti-hypertensive therapy (ACEi) with BP of  205/87 in ED on 11/27/13 in setting of fall and headache, who presents with asymptomatic blood pressure of 154/85 later worsened to 172/91.  Plan:  -BP 172/91 not at goal < 140/80 -Continue lisinopril 40 mg to prevent CKD progression (proteinuria 02/14/13) -If BP continues to be uncontrolled, consider adding diuretic therapy in setting of chronic LE edema -Obtain BMP at next visit -Continue to monitor blood pressure

## 2013-12-04 NOTE — Assessment & Plan Note (Addendum)
Assessment: Pt with pre-diabetes with last A1c of 5.9 on [id number] complicated by peripheral neuropathy and proteinuria who is compliant with recently adjusted insulin regimen (12 to 8 U Lantus) who presents with asymptomatic hypoglycemia with CBG of 55 later improved to 132 after administration of food most likely due to no longer being insulin dependent in setting of pre-diabetes and decreased PO intake.   Plan: -HbA1c 5.9 at goal <7.0 -Discontinue Lantus 8 U due to goal HbA1c and frequent asymptomatic hypoglycemic events -Pt to monitor BG before breakfast and before dinner and bring in meter at next visit in 2 weeks  -BP 172/91 not at goal < 140/80, consider adding diuretic therapy in setting of chronic LE edema -Continue lisinopril 40 mg to prevent CKD progression (proteinuria 02/14/13) -LDL 127 not at goal <70-100, continue pravastatin 20 mg daily -Uncontrolled peripheral neuropathy, continue gabapentin 100 mg TID, consider increasing to 300 mg TID  -Pt due to receive annual dilated eye exam ( right eye blurriness)  -BMI 28.2 not at goal <25, encourage weight loss -Continue daily 81 mg aspirin for primary CVD prevention

## 2013-12-04 NOTE — Progress Notes (Signed)
Patient ID: Amanda [name], female   DOB: Sep 19, 1951, 62 y.o.   MRN: [id number]   Subjective:   Patient ID: Amanda [name] female   DOB: 1951/08/09 62 y.o.   MRN: [id number]  HPI: Marleena Mckeel Bergan is a 61 y.o. pleasant woman with past medical history of insulin dependent Type II DM (last A1c 5.9 on 09/09/13) with neuropathy, hypertension, hyperlipidemia, GERD, and allergic rhinitis who presents with chief complaint of recent fall with neck and shoulder stiffness and pain.  Pt reports that on 11/27/13 she was rushing to her front door and slipped on the wood ramp due to the floor being wet. She fell backwards and consequently hit the back of her head after which she lost consciousness for a few seconds. At the time she did not have neck or shoulder pain, but did have severe headache (9/10), dizziness, and mental status changes (confused with memory lapse). She was seen in the ED the same day where CT head revealed no abnormalities and her mental status and headache improved. She was given percocet which made her feel drowsy without much pain relief. Three days later she began having neck and bilateral shoulder pain with radiation to her chest wall  that she describes as feeling tense and stiff. She is able to raise her arms above her head and has normal ROM without shooting pain down her arms or sensory changes. Pain is relieved with rest, hot shower, and OTC ibuprofen.    She reports checking her blood sugars daily with no symptomatic hypoglycemic events since last visit. She is compliant with Lantus 8 U daily. She denies polydipsia, polyuria, polyphagia, or weight changes. She has right eye blurriness and is due for her annual diabetic exam. She has chronic neuropathy in her feet and reports being compliant with gabapentin 100 mg TID.        She reports being complaint with her antihypertensive medication, Lisinopril 40 mg daily. She has chronic LE bilateral swelling and reports having varicose veins.    She has history of allergic rhinitis with chronic sinus congestion that is unrelieved with OTC anti-histamines.     Past Medical History  Diagnosis Date  . Diabetes mellitus   . Hypotension   . Influenza A (H1N1) 01/03/2013  . DKA (diabetic ketoacidoses) 01/02/2013  . Anemia   . SYNCOPE 05/10/2010    Qualifier: Diagnosis of  By: Marilynne Halsted, RN, BSN, Jacquelyn    . Hypertension   . Hyperlipidemia    Current Outpatient Prescriptions  Medication Sig Dispense Refill  . acetaminophen (TYLENOL) 325 MG tablet Take 2 tablets (650 mg total) by mouth every 6 (six) hours as needed (or Fever >/= 101).  60 tablet  0  . aspirin 81 MG tablet Take 81 mg by mouth daily.      [name] gabapentin (NEURONTIN) 100 MG capsule Take 1 capsule (100 mg total) by mouth 3 (three) times daily.  90 capsule  2  . ibuprofen (ADVIL,MOTRIN) 200 MG tablet Take 400 mg by mouth every 6 (six) hours as needed.      [name] lisinopril (PRINIVIL,ZESTRIL) 40 MG tablet Take 1 tablet (40 mg total) by mouth daily.  30 tablet  2  . mometasone (NASONEX) 50 MCG/ACT nasal spray Place 2 sprays into the nose daily.  17 g  11  . omeprazole (PRILOSEC) 40 MG capsule Take 1 capsule (40 mg total) by mouth daily.  30 capsule  0  . oxyCODONE-acetaminophen (PERCOCET) 5-325 MG per tablet Take 1 tablet by  mouth every 6 (six) hours as needed for moderate pain.  15 tablet  0  . pravastatin (PRAVACHOL) 20 MG tablet Take 20 mg by mouth daily.      . Probiotic Product (ALIGN) 4 MG CAPS Take 1 capsule by mouth daily.       No current facility-administered medications for this visit.   Family History  Problem Relation Age of Onset  . Diabetes Mother   . Hypertension Mother   . Aneurysm Father   . Diabetes Sister   . Hypertension Sister   . Kidney disease Sister     One Kidney transplant, one sister on HD   History   Social History  . Marital Status: Married    Spouse Name: N/A    Number of Children: N/A  . Years of Education: N/A   Social History Main  Topics  . Smoking status: Never Smoker   . Smokeless tobacco: Never Used  . Alcohol Use: No  . Drug Use: No  . Sexual Activity: None   Other Topics Concern  . None   Social History Narrative  . None   Review of Systems: Review of Systems  Constitutional: Positive for chills. Negative for fever, weight loss, malaise/fatigue and diaphoresis.  HENT: Positive for congestion (due to allergic rhinitis ). Negative for sore throat.   Eyes: Positive for blurred vision (right eye).  Respiratory: Positive for shortness of breath.   Cardiovascular: Positive for leg swelling (chronic b/l). Negative for chest pain and palpitations.  Gastrointestinal: Positive for diarrhea ( loose stools ). Negative for nausea, vomiting, constipation and blood in stool.  Genitourinary: Negative for dysuria, urgency, frequency and hematuria.  Musculoskeletal: Positive for falls (on 11/27/13), myalgias (shoulders) and neck pain. Negative for back pain and joint pain.  Skin: Negative for rash.  Neurological: Positive for sensory change (chronic peripheral neuropathy in b/l LE), loss of consciousness (on 11/27/13) and headaches (resolved). Negative for dizziness, tingling, focal weakness and weakness.    Objective:  Physical Exam: Filed Vitals:   12/02/13 1540 12/02/13 1721  BP: 154/85 172/91  Pulse: 80 81  Temp: 97.8 F (36.6 C)   TempSrc: Oral   Height: 5\' 2"  (1.575 m)   Weight: 153 lb 12.8 oz (69.763 kg)   SpO2: 98%     Physical Exam  Constitutional: She is oriented to person, place, and time. She appears well-developed and well-nourished. No distress.  HENT:  Head: Normocephalic and atraumatic.  Right Ear: External ear normal.  Left Ear: External ear normal.  Mouth/Throat: Oropharynx is clear and moist.  No sinus tenderness  Eyes: Conjunctivae and EOM are normal. Pupils are equal, round, and reactive to light. Right eye exhibits no discharge. Left eye exhibits no discharge.  Neck: Normal range of  motion. Neck supple.  Cardiovascular: Normal rate, regular rhythm and normal heart sounds.   Pulmonary/Chest: Effort normal and breath sounds normal. No respiratory distress. She has no wheezes. She has no rales.  Abdominal: Soft. Bowel sounds are normal. She exhibits no distension. There is no tenderness. There is no rebound and no guarding.  Musculoskeletal: Normal range of motion. She exhibits edema (+1 LE b/l edema ). She exhibits no tenderness.  Tense trapezius & SCM muscles Negative drop arm test b/l  Neurological: She is alert and oriented to person, place, and time. She displays normal reflexes. No cranial nerve deficit. She exhibits normal muscle tone. Coordination normal.  Skin: Skin is warm and dry. No rash noted. She is not diaphoretic. No  erythema. No pallor.  Psychiatric: She has a normal mood and affect. Her behavior is normal. Thought content normal.    Assessment & Plan:  Please see problem list for problem-based assessment and plan

## 2013-12-04 NOTE — Assessment & Plan Note (Addendum)
Assessment: Pt with mechanical fall on 11/27/13 with neck and shoulder stiffness beginning 11/30/13 with no arm weakness or paraesthesias and found to have normal neck and shoulder ROM with evidence of tense SCM and trapezius muscles improved with hot bath and OTC pain medications with no concern for herniated disc, fracture, dislocation, bursitis, or rotator cuff injury with etiology most likely due to muscle strain after fall.   Plan:  -Pt instructed to apply warm compresses to shoulder and neck  -Pt instructed to perform ROM exercises   -Pt instructed to take OTC tylenol or NSAIDs as needed for pain  -If worsens or does not improve with conservative therapy consider imaging and sports medicine referral

## 2013-12-05 ENCOUNTER — Ambulatory Visit: Payer: Self-pay | Admitting: Internal Medicine

## 2013-12-05 NOTE — Progress Notes (Signed)
I saw and evaluated the patient.  I personally confirmed the key portions of the history and exam documented by Dr. Wilson and I reviewed pertinent patient test results.  The assessment, diagnosis, and plan were formulated together and I agree with the documentation in the resident's note. 

## 2013-12-27 ENCOUNTER — Ambulatory Visit: Payer: Self-pay | Admitting: Internal Medicine

## 2014-01-03 ENCOUNTER — Encounter: Payer: Self-pay | Admitting: Internal Medicine

## 2014-01-03 ENCOUNTER — Ambulatory Visit (INDEPENDENT_AMBULATORY_CARE_PROVIDER_SITE_OTHER): Payer: No Typology Code available for payment source | Admitting: Internal Medicine

## 2014-01-03 VITALS — BP 110/61 | HR 84 | Temp 96.3°F | Ht 62.0 in | Wt 142.5 lb

## 2014-01-03 DIAGNOSIS — E118 Type 2 diabetes mellitus with unspecified complications: Principal | ICD-10-CM

## 2014-01-03 DIAGNOSIS — J0100 Acute maxillary sinusitis, unspecified: Secondary | ICD-10-CM | POA: Insufficient documentation

## 2014-01-03 DIAGNOSIS — I10 Essential (primary) hypertension: Secondary | ICD-10-CM

## 2014-01-03 DIAGNOSIS — K5900 Constipation, unspecified: Secondary | ICD-10-CM

## 2014-01-03 DIAGNOSIS — R300 Dysuria: Secondary | ICD-10-CM

## 2014-01-03 DIAGNOSIS — J019 Acute sinusitis, unspecified: Secondary | ICD-10-CM

## 2014-01-03 DIAGNOSIS — N390 Urinary tract infection, site not specified: Secondary | ICD-10-CM

## 2014-01-03 DIAGNOSIS — E1140 Type 2 diabetes mellitus with diabetic neuropathy, unspecified: Secondary | ICD-10-CM

## 2014-01-03 DIAGNOSIS — E1165 Type 2 diabetes mellitus with hyperglycemia: Secondary | ICD-10-CM

## 2014-01-03 DIAGNOSIS — E785 Hyperlipidemia, unspecified: Secondary | ICD-10-CM

## 2014-01-03 DIAGNOSIS — IMO0002 Reserved for concepts with insufficient information to code with codable children: Secondary | ICD-10-CM

## 2014-01-03 LAB — POCT URINALYSIS DIPSTICK
Glucose, UA: 100
Ketones, UA: 15
Nitrite, UA: NEGATIVE
Protein, UA: >=300
Spec Grav, UA: >=1.03
Urobilinogen, UA: 1
pH, UA: 5.5

## 2014-01-03 LAB — GLUCOSE, CAPILLARY: Glucose-Capillary: 164 mg/dL — ABNORMAL HIGH (ref 70–99)

## 2014-01-03 MED ORDER — POLYETHYLENE GLYCOL 3350 17 G PO PACK: 17.0000 g | PACK | Freq: Every day | ORAL | Status: DC

## 2014-01-03 MED ORDER — LISINOPRIL 40 MG PO TABS: 40.0000 mg | ORAL_TABLET | Freq: Every day | ORAL | Status: DC

## 2014-01-03 MED ORDER — LEVOFLOXACIN 750 MG PO TABS: 750.0000 mg | ORAL_TABLET | Freq: Every day | ORAL | Status: DC

## 2014-01-03 MED ORDER — METFORMIN HCL 500 MG PO TABS: 500.0000 mg | ORAL_TABLET | Freq: Two times a day (BID) | ORAL | Status: DC

## 2014-01-03 MED ORDER — GABAPENTIN 100 MG PO CAPS: 100.0000 mg | ORAL_CAPSULE | Freq: Three times a day (TID) | ORAL | Status: DC

## 2014-01-03 MED ORDER — PRAVASTATIN SODIUM 20 MG PO TABS: 20.0000 mg | ORAL_TABLET | Freq: Every day | ORAL | Status: DC

## 2014-01-03 NOTE — Assessment & Plan Note (Addendum)
Assessment:  She reports clear rhinorrhea, nasal congestion, cough, sore throat and itchy eyes x 1 week.  + sinus tenderness on exam.  No fever, oropharyngeal exudate, purulent discharge noted on exam.  Likely acute viral sinusitis.  + history of seasonal allergy but does not use Nasonex regularly.   Doubt bacterial process given short duration of symptoms.  Patient found to have UTI so I have prescribed Levaquin 750mg  daily x 5 days which will cover upper respiratory tract as well.  Plan:  1) Levaquin 750mg  daily x 5 days (for UTI but will cover bacterial sinusitis)            2) Nasonex            3) Follow-up in 1 week

## 2014-01-03 NOTE — Progress Notes (Signed)
   Subjective:    Patient ID: Amanda [name], female    DOB: Mar 04, 1951, 63 y.o.   MRN: [id number]  HPI Comments: Amanda [name] is a 89 year with DM type 2, HTN, seasonal allergies who presents for follow-up of her DM and HTN.  Her insulin was discontinued four months ago because her HgbA1c 5.9 and she was experiencing increasing hypoglycemic events.  Her HgbA1c is 11.1 today.  She denies dietary indiscretions.  She did not bring her meter or logs.  She checks her blood sugar about four times per week and reports her fasting AM sugars are usually low 90s to low 120s.  She says she has only noticed 300s and 400s about four times in the past month.  She has had about six low blood sugar episodes including a BG of 47 today at [facility].  She says she did eat grits at 11AM.  BG in clinic this afternoon is in the 160s.  She does not usually experience symptoms when her sugars are low but she did become diaphoretic and tremulous with the low of 47 today.  She has noticed recent blurry vision and polyuria likely due to hyperglycemia.     Review of Systems  HENT: Positive for congestion, rhinorrhea, sinus pressure and sore throat.   Eyes: Positive for visual disturbance.  Respiratory: Positive for cough. Negative for shortness of breath.   Cardiovascular: Negative for chest pain.  Gastrointestinal: Positive for constipation. Negative for nausea, vomiting and diarrhea.  Endocrine: Positive for polyuria. Negative for polydipsia.  Genitourinary: Positive for dysuria. Negative for vaginal bleeding, vaginal discharge and vaginal pain.  Neurological: Negative for weakness and light-headedness.       Objective:   Physical Exam  Vitals reviewed. Constitutional: She is oriented to person, place, and time. She appears well-developed. No distress.  HENT:  Mouth/Throat: Oropharynx is clear and moist. No oropharyngeal exudate.  Eyes: EOM are normal. Pupils are equal, round, and reactive to light. Right eye exhibits no  discharge. Left eye exhibits no discharge.  Neck: Neck supple.  Cardiovascular: Normal rate, regular rhythm and normal heart sounds.   Pulmonary/Chest: Effort normal and breath sounds normal. No respiratory distress. She has no wheezes. She has no rales.  Abdominal: Soft. Bowel sounds are normal. She exhibits no distension. There is no tenderness.  Lymphadenopathy:    She has no cervical adenopathy.  Neurological: She is alert and oriented to person, place, and time. No cranial nerve deficit. Coordination normal.  Skin: Skin is warm. She is not diaphoretic.  Psychiatric: She has a normal mood and affect. Her behavior is normal.          Assessment & Plan:  Please see problem based assessment and plan.

## 2014-01-03 NOTE — Patient Instructions (Addendum)
1. Please take Metformin 500mg  BID.  Check you blood sugars every morning when you get up and every evening.  Record your blood sugars and bring them to clinic in 1 week.  2. Try Nasonex for your allergy symptoms.  If your symptoms continue for another week you may need additional treatment.  3.. Please take all medications as prescribed.    4. If you have worsening of your symptoms or new symptoms arise, please call the clinic [phone]), or go to the ER immediately if symptoms are severe.

## 2014-01-03 NOTE — Assessment & Plan Note (Signed)
Lab Results  Component Value Date   HGBA1C 5.9 09/09/2013   HGBA1C 6.9 04/19/2013   HGBA1C 9.6 02/14/2013     Assessment: Diabetes control:  uncontrolled Progress toward A1C goal:   deteriorated Comments: HgbA1c 11.1 today (was 5.9 four months ago).  She denies any dietary indiscretions.  Is agreeable to resuming medication.  Plan: Medications:  Start metformin 500mg  BID Home glucose monitoring: Frequency:  2-3 times daily Timing:  at least every AM and PM Self management tools provided: home glucose logbook Other plans: She is agreeable to starting Metformin.  Did not re-start insulin today because she is experiencing hypoglycemia. Can dose insulin once logs are reviewed.

## 2014-01-03 NOTE — Assessment & Plan Note (Signed)
BP Readings from Last 3 Encounters:  01/03/14 110/61  12/02/13 172/91  11/27/13 132/71    Lab Results  Component Value Date   NA 142 05/20/2013   K 4.3 05/20/2013   CREATININE 0.84 05/20/2013    Assessment: Blood pressure control:  well controlled Progress toward BP goal:   at goal Comments: continue current therapy  Plan: Medications:  Continue Lisinopril 40mg  daily Other plans: follow-up in 3 months for BP check

## 2014-01-03 NOTE — Assessment & Plan Note (Addendum)
Assessment:  + dysuria and suprapubic pain.  Urine dipstick leukesterase +.  Given her DM will treat as complicated.  She has tolerated Levaquin for UTI treatment in the past.    Plan:  1) 750mg  Levaquin daily x 5 days            2) urine culture

## 2014-01-08 NOTE — Progress Notes (Signed)
Case discussed with Dr. Wilson at the time of the visit.  We reviewed the resident's history and exam and pertinent patient test results.  I agree with the assessment, diagnosis, and plan of care documented in the resident's note. 

## 2014-01-15 ENCOUNTER — Ambulatory Visit: Payer: No Typology Code available for payment source | Admitting: Internal Medicine

## 2014-01-17 ENCOUNTER — Ambulatory Visit: Payer: No Typology Code available for payment source | Admitting: Internal Medicine

## 2014-01-17 ENCOUNTER — Encounter: Payer: Self-pay | Admitting: Internal Medicine

## 2014-01-17 ENCOUNTER — Ambulatory Visit (INDEPENDENT_AMBULATORY_CARE_PROVIDER_SITE_OTHER): Payer: No Typology Code available for payment source | Admitting: Internal Medicine

## 2014-01-17 VITALS — BP 183/89 | HR 90 | Temp 98.1°F | Wt 143.1 lb

## 2014-01-17 DIAGNOSIS — J309 Allergic rhinitis, unspecified: Secondary | ICD-10-CM

## 2014-01-17 DIAGNOSIS — J302 Other seasonal allergic rhinitis: Secondary | ICD-10-CM

## 2014-01-17 DIAGNOSIS — E118 Type 2 diabetes mellitus with unspecified complications: Principal | ICD-10-CM

## 2014-01-17 DIAGNOSIS — IMO0002 Reserved for concepts with insufficient information to code with codable children: Secondary | ICD-10-CM

## 2014-01-17 DIAGNOSIS — Z0000 Encounter for general adult medical examination without abnormal findings: Secondary | ICD-10-CM

## 2014-01-17 DIAGNOSIS — K219 Gastro-esophageal reflux disease without esophagitis: Secondary | ICD-10-CM

## 2014-01-17 DIAGNOSIS — E1165 Type 2 diabetes mellitus with hyperglycemia: Secondary | ICD-10-CM

## 2014-01-17 LAB — GLUCOSE, CAPILLARY: Glucose-Capillary: 89 mg/dL (ref 70–99)

## 2014-01-17 MED ORDER — OMEPRAZOLE 40 MG PO CPDR: 40.0000 mg | DELAYED_RELEASE_CAPSULE | Freq: Every day | ORAL | Status: DC

## 2014-01-17 MED ORDER — INSULIN GLARGINE 100 UNIT/ML ~~LOC~~ SOLN: 6.0000 [IU] | Freq: Every day | SUBCUTANEOUS | Status: DC

## 2014-01-17 NOTE — Assessment & Plan Note (Addendum)
She has schedule eye appointment for April 2015.  Will defer other health maintenance for visit with PCP next month.

## 2014-01-17 NOTE — Assessment & Plan Note (Signed)
Lab Results  Component Value Date   HGBA1C 5.9 09/09/2013   HGBA1C 6.9 04/19/2013   HGBA1C 9.6 02/14/2013  HgbA1c:  11.1         01/03/2014                                Assessment: Diabetes control:  uncontrolled Progress toward A1C goal:   unable to assess Comments: She has only had one low BG but now with highs of 300 and 400, particularly in the AM. She says she does not eat after 10PM and has been compliant with BID Metformin.  She would like to start insulin again and I feel she does need it.  Plan: Medications:  Resume insulin, start at 6 units qHS.  Continue Metformin 500mg  BID. Home glucose monitoring: Frequency:  3-4 times daily Timing:  at least fasting AM and a PM Self management tools provided: home glucose logbook Other plans: Will ask DM educator to call her next week to see how she is doing back on insulin regime.  Return for follow-up with PCP next month.  Bring BG logbook to visit.  Insulin will likely need to be increased.

## 2014-01-17 NOTE — Assessment & Plan Note (Addendum)
She continues to exhibit clear rhinorrhea, congestion and cough.  No fever, dyspnea or lower respiratory findings.  Signs and symptoms consistent with allergic rhinitis.  I have advised her to continue Nasonex and to add an OTC decongestant if necessary.  She should return to office if symptoms worsen or new symptoms arise.

## 2014-01-17 NOTE — Progress Notes (Signed)
   Subjective:    Patient ID: Amanda [name], female    DOB: 1951-01-21, 63 y.o.   MRN: [id number]  HPI Comments: Amanda [name] is a 63 year with DM type 2 (HgbA1c 11.19 December 2013), HTN, seasonal allergies who presents for follow-up of her DM and HTN.  Her insulin was stopped four months ago after HgbA1c 5.9 and several hypoglycemic episodes.  Hgb A1c was 11.1 two weeks ago and she started on Metformin 500mg  BID and asked to record her BG and return to clinic.  She takes Metformin with breakfast and again around 8-9PM.  No snacking past 10PM.  Her average AM fastings are in the 300s.  Pre-lunch is usually low 100s.  Pre-dinner 200s. One BG of 69 during the day occurred after she had not eaten for several hours.  No other low BGs.  She notes increased diarrhea on the Metformin but is willing to continue using it.  Denies polydipsia or polyuria. Still with occasional blurry vision.     Review of Systems  Constitutional: Positive for chills. Negative for fever.  HENT: Positive for congestion, rhinorrhea and sinus pressure.   Respiratory: Positive for cough. Negative for shortness of breath.   Cardiovascular: Negative for leg swelling.  Gastrointestinal: Positive for diarrhea. Negative for nausea, vomiting, abdominal pain and blood in stool.       Diarrhea associated with Metformin.   Genitourinary: Negative for dysuria and hematuria.       Objective:   Physical Exam  Constitutional: She is oriented to person, place, and time. She appears well-developed. No distress.  HENT:  Mouth/Throat: Oropharynx is clear and moist. No oropharyngeal exudate.  Nasal mucosa erythematous.  Tympanic membranes intact B/L  Eyes: EOM are normal. Pupils are equal, round, and reactive to light.  Neck: Neck supple.  Cardiovascular: Normal rate, regular rhythm and normal heart sounds.   Pulmonary/Chest: Effort normal and breath sounds normal. No respiratory distress. She has no wheezes. She has no rales.  Abdominal:  Soft. Bowel sounds are normal. She exhibits no distension. There is no tenderness.  Neurological: She is alert and oriented to person, place, and time. No cranial nerve deficit.  Skin: Skin is warm. She is not diaphoretic.  Psychiatric: She has a normal mood and affect. Her behavior is normal.          Assessment & Plan:  Please see problem based assessment and plan.

## 2014-01-17 NOTE — Patient Instructions (Signed)
1. Please use 6 units of Lantus every evening.  Continue to take Metformin 500mg  twice daily.  Please continue to check your BG 3-4 times per day (morning before your eat), lunch, dinner and before bed.   2. Please take all medications as prescribed.    3. If you have worsening of your symptoms or new symptoms arise, please call the clinic [phone]), or go to the ER immediately if symptoms are severe.  Return to clinic for your appointment on Feb 24.

## 2014-01-18 NOTE — Progress Notes (Signed)
Case discussed with Dr. Wilson at the time of the visit.  We reviewed the resident's history and exam and pertinent patient test results.  I agree with the assessment, diagnosis, and plan of care documented in the resident's note. 

## 2014-02-11 ENCOUNTER — Encounter: Payer: Self-pay | Admitting: Internal Medicine

## 2014-02-19 ENCOUNTER — Ambulatory Visit (INDEPENDENT_AMBULATORY_CARE_PROVIDER_SITE_OTHER): Payer: No Typology Code available for payment source | Admitting: Internal Medicine

## 2014-02-19 ENCOUNTER — Encounter: Payer: Self-pay | Admitting: Internal Medicine

## 2014-02-19 VITALS — BP 190/97 | HR 82 | Temp 98.4°F | Wt 150.1 lb

## 2014-02-19 DIAGNOSIS — IMO0002 Reserved for concepts with insufficient information to code with codable children: Secondary | ICD-10-CM

## 2014-02-19 DIAGNOSIS — Z23 Encounter for immunization: Secondary | ICD-10-CM

## 2014-02-19 DIAGNOSIS — E118 Type 2 diabetes mellitus with unspecified complications: Principal | ICD-10-CM

## 2014-02-19 DIAGNOSIS — E1165 Type 2 diabetes mellitus with hyperglycemia: Secondary | ICD-10-CM

## 2014-02-19 DIAGNOSIS — I10 Essential (primary) hypertension: Secondary | ICD-10-CM

## 2014-02-19 DIAGNOSIS — Z0000 Encounter for general adult medical examination without abnormal findings: Secondary | ICD-10-CM

## 2014-02-19 LAB — POCT GLYCOSYLATED HEMOGLOBIN (HGB A1C)
Hemoglobin A1C: 11.1
Hemoglobin A1C: 9.1

## 2014-02-19 LAB — GLUCOSE, CAPILLARY: Glucose-Capillary: 71 mg/dL (ref 70–99)

## 2014-02-19 MED ORDER — HYDROCHLOROTHIAZIDE 25 MG PO TABS
25.0000 mg | ORAL_TABLET | Freq: Every day | ORAL | Status: DC
Start: 2014-02-19 — End: 2014-09-13

## 2014-02-19 MED ORDER — METFORMIN HCL 500 MG PO TABS: 1000.0000 mg | ORAL_TABLET | Freq: Two times a day (BID) | ORAL | Status: DC

## 2014-02-19 MED ORDER — INSULIN DETEMIR 100 UNIT/ML FLEXPEN: 6.0000 [IU] | PEN_INJECTOR | Freq: Every day | SUBCUTANEOUS | Status: DC

## 2014-02-19 MED ORDER — HYDROCHLOROTHIAZIDE 12.5 MG PO TABS: 25.0000 mg | ORAL_TABLET | Freq: Every day | ORAL | Status: DC

## 2014-02-19 NOTE — Assessment & Plan Note (Signed)
Lab Results  Component Value Date   HGBA1C 9.1 02/19/2014   HGBA1C 11.1 01/03/2014   HGBA1C 5.9 09/09/2013     Assessment: Diabetes control: poor control (HgbA1C >9%) Progress toward A1C goal:  improved Comments:   Plan: Medications:  Increase Metformin to 1g BID, Change Lantus to Levemir (per formulary) continue at 6units QHS Home glucose monitoring: Frequency: 2 times a day Timing: before breakfast;before dinner Instruction/counseling given: reminded to get eye exam, reminded to bring blood glucose meter & log to each visit, reminded to bring medications to each visit, discussed foot care and discussed diet Educational resources provided: brochure Self management tools provided: home glucose logbook Other plans: Patient instructed multiple small meals throughout day are much better and told to at least have 3 meals a day.  She will continue with 6 units of levemir, this may need to be increased after glucometer review at next visit in 2 weeks.

## 2014-02-19 NOTE — Patient Instructions (Signed)
Please increase metformin to 1000mg  twice a day.   We have changed lantus to Levemir, continue to take 6 units each day. Remember to eat at least 3 meals a day, preferable to eat multiple smaller meals. Start taking hydrochlorothiazide 25mg  each day. Work on finding the name of the doctor who preformed you last colonoscopy. We will try to help with scheduling your follow up mammogram.  Hydrochlorothiazide, HCTZ capsules or tablets What is this medicine? HYDROCHLOROTHIAZIDE (hye droe klor oh THYE a zide) is a diuretic. It increases the amount of urine passed, which causes the body to lose salt and water. This medicine is used to treat high blood pressure. It is also reduces the swelling and water retention caused by various medical conditions, such as heart, liver, or kidney disease. This medicine may be used for other purposes; ask your health care provider or pharmacist if you have questions. COMMON BRAND NAME(S): Esidrix, Ezide, HydroDIURIL, Microzide, Oretic, Zide What should I tell my health care provider before I take this medicine? They need to know if you have any of these conditions: -diabetes -gout -immune system problems, like lupus -kidney disease or kidney stones -liver disease -pancreatitis -small amount of urine or difficulty passing urine -an unusual or allergic reaction to hydrochlorothiazide, sulfa drugs, other medicines, foods, dyes, or preservatives -pregnant or trying to get pregnant -breast-feeding How should I use this medicine? Take this medicine by mouth with a glass of water. Follow the directions on the prescription label. Take your medicine at regular intervals. Remember that you will need to pass urine frequently after taking this medicine. Do not take your doses at a time of day that will cause you problems. Do not stop taking your medicine unless your doctor tells you to. Talk to your pediatrician regarding the use of this medicine in children. Special care may  be needed. Overdosage: If you think you have taken too much of this medicine contact a poison control center or emergency room at once. NOTE: This medicine is only for you. Do not share this medicine with others. What if I miss a dose? If you miss a dose, take it as soon as you can. If it is almost time for your next dose, take only that dose. Do not take double or extra doses. What may interact with this medicine? -cholestyramine -colestipol -digoxin -dofetilide -lithium -medicines for blood pressure -medicines for diabetes -medicines that relax muscles for surgery -other diuretics -steroid medicines like prednisone or cortisone This list may not describe all possible interactions. Give your health care provider a list of all the medicines, herbs, non-prescription drugs, or dietary supplements you use. Also tell them if you smoke, drink alcohol, or use illegal drugs. Some items may interact with your medicine. What should I watch for while using this medicine? Visit your doctor or health care professional for regular checks on your progress. Check your blood pressure as directed. Ask your doctor or health care professional what your blood pressure should be and when you should contact him or her. You may need to be on a special diet while taking this medicine. Ask your doctor. Check with your doctor or health care professional if you get an attack of severe diarrhea, nausea and vomiting, or if you sweat a lot. The loss of too much body fluid can make it dangerous for you to take this medicine. You may get drowsy or dizzy. Do not drive, use machinery, or do anything that needs mental alertness until you know how  this medicine affects you. Do not stand or sit up quickly, especially if you are an older patient. This reduces the risk of dizzy or fainting spells. Alcohol may interfere with the effect of this medicine. Avoid alcoholic drinks. This medicine may affect your blood sugar level. If you  have diabetes, check with your doctor or health care professional before changing the dose of your diabetic medicine. This medicine can make you more sensitive to the sun. Keep out of the sun. If you cannot avoid being in the sun, wear protective clothing and use sunscreen. Do not use sun lamps or tanning beds/booths. What side effects may I notice from receiving this medicine? Side effects that you should report to your doctor or health care professional as soon as possible: -allergic reactions such as skin rash or itching, hives, swelling of the lips, mouth, tongue, or throat -changes in vision -chest pain -eye pain -fast or irregular heartbeat -feeling faint or lightheaded, falls -gout attack -muscle pain or cramps -pain or difficulty when passing urine -pain, tingling, numbness in the hands or feet -redness, blistering, peeling or loosening of the skin, including inside the mouth -unusually weak or tired Side effects that usually do not require medical attention (report to your doctor or health care professional if they continue or are bothersome): -change in sex drive or performance -dry mouth -headache -stomach upset This list may not describe all possible side effects. Call your doctor for medical advice about side effects. You may report side effects to FDA at 1-800-FDA-1088. Where should I keep my medicine? Keep out of the reach of children. Store at room temperature between 15 and 30 degrees C (59 and 86 degrees F). Do not freeze. Protect from light and moisture. Keep container closed tightly. Throw away any unused medicine after the expiration date. NOTE: This sheet is a summary. It may not cover all possible information. If you have questions about this medicine, talk to your doctor, pharmacist, or health care provider.  2014, [employer]vier/[employer] Standard. (2010-07-30 12:57:37)

## 2014-02-19 NOTE — Progress Notes (Signed)
Subjective:   Patient ID: Amanda [name] female   DOB: 11/19/1951 63 y.o.   MRN: [id number]  HPI: Amanda [name] is a 63 y.o. female with DM type 2 (HgbA1c 11.19 December 2013), HTN, seasonal allergies who presents for follow-up of her DM and HTN. Hgb A1c was 11.1 in January ago and she started on Metformin 500mg  BID and asked to record her BG and return to clinic.She was subsequently restarted at follow up 2 weeks later on Lantus 6 units QHS.  She brings in a glucose log of twice daily readings for the past week.  There are 3 low readings of 67,63, and 63 all of these were checked between 11-11:30am her other AM readings are between 120-160 and taken early in the morning.  No sugars are about 280.  She denies polydipsia or polyuria, no blurry vision.   Past Medical History  Diagnosis Date  . Diabetes mellitus   . Hypotension   . Influenza A (H1N1) 01/03/2013  . DKA (diabetic ketoacidoses) 01/02/2013  . Anemia   . SYNCOPE 05/10/2010    Qualifier: Diagnosis of  By: Marilynne Halsted, RN, BSN, Jacquelyn    . Hypertension   . Hyperlipidemia    Current Outpatient Prescriptions  Medication Sig Dispense Refill  . acetaminophen (TYLENOL) 325 MG tablet Take 2 tablets (650 mg total) by mouth every 6 (six) hours as needed (or Fever >/= 101).  60 tablet  0  . aspirin 81 MG tablet Take 81 mg by mouth daily.      [name] gabapentin (NEURONTIN) 100 MG capsule Take 1 capsule (100 mg total) by mouth 3 (three) times daily.  90 capsule  2  . hydrochlorothiazide (HYDRODIURIL) 25 MG tablet Take 1 tablet (25 mg total) by mouth daily.  30 tablet  5  . ibuprofen (ADVIL,MOTRIN) 200 MG tablet Take 400 mg by mouth every 6 (six) hours as needed.      . insulin glargine (LANTUS) 100 UNIT/ML injection Inject 0.06 mLs (6 Units total) into the skin at bedtime.  10 mL  3  . lisinopril (PRINIVIL,ZESTRIL) 40 MG tablet Take 1 tablet (40 mg total) by mouth daily.  30 tablet  2  . metFORMIN (GLUCOPHAGE) 500 MG tablet Take 1 tablet (500 mg  total) by mouth 2 (two) times daily.  60 tablet  1  . mometasone (NASONEX) 50 MCG/ACT nasal spray Place 2 sprays into the nose daily.  17 g  11  . omeprazole (PRILOSEC) 40 MG capsule Take 1 capsule (40 mg total) by mouth daily.  30 capsule  1  . polyethylene glycol (MIRALAX) packet Take 17 g by mouth daily.  14 each  0  . pravastatin (PRAVACHOL) 20 MG tablet Take 1 tablet (20 mg total) by mouth daily.  30 tablet  2   No current facility-administered medications for this visit.   Family History  Problem Relation Age of Onset  . Diabetes Mother   . Hypertension Mother   . Aneurysm Father   . Diabetes Sister   . Hypertension Sister   . Kidney disease Sister     One Kidney transplant, one sister on HD   History   Social History  . Marital Status: Married    Spouse Name: N/A    Number of Children: N/A  . Years of Education: N/A   Social History Main Topics  . Smoking status: Never Smoker   . Smokeless tobacco: Never Used  . Alcohol Use: No  . Drug Use: No  .  Sexual Activity: None   Other Topics Concern  . None   Social History Narrative  . None   Review of Systems: Review of Systems  Constitutional: Negative for fever, chills, weight loss, malaise/fatigue and diaphoresis.  HENT: Negative for hearing loss.   Eyes: Negative for blurred vision.  Respiratory: Negative for cough, sputum production and shortness of breath.   Cardiovascular: Positive for leg swelling (occasionally at end of day). Negative for chest pain and palpitations.  Gastrointestinal: Negative for nausea, vomiting, abdominal pain, diarrhea and constipation.  Genitourinary: Negative for dysuria.  Musculoskeletal: Negative for myalgias.  Neurological: Positive for tingling (occasionally in feet at night). Negative for dizziness, sensory change, speech change and headaches.     Objective:  Physical Exam: Filed Vitals:   02/19/14 1450  BP: 186/90  Pulse: 80  Temp: 98.4 F (36.9 C)  TempSrc: Oral   Weight: 150 lb 1.6 oz (68.085 kg)  SpO2: 98%  Physical Exam  Nursing note and vitals reviewed. Constitutional: She is oriented to person, place, and time and well-developed, well-nourished, and in no distress. No distress.  HENT:  Head: Normocephalic and atraumatic.  Eyes: EOM are normal. Pupils are equal, round, and reactive to light.  Cardiovascular: Normal rate, regular rhythm, normal heart sounds and intact distal pulses.   No murmur heard. Pulmonary/Chest: Effort normal and breath sounds normal. No respiratory distress. She has no wheezes.  Abdominal: Soft. Bowel sounds are normal. She exhibits no distension. There is no tenderness.  Musculoskeletal: She exhibits edema (trace pedal).  Neurological: She is alert and oriented to person, place, and time. No cranial nerve deficit.  Skin: Skin is warm and dry. She is not diaphoretic.  Psychiatric: Memory, affect and judgment normal.    Assessment & Plan:   See Problem Based Assessment and Plan Meds ordered this encounter  Medications  . DISCONTD: hydrochlorothiazide (HYDRODIURIL) 12.5 MG tablet    Sig: Take 2 tablets (25 mg total) by mouth daily.    Dispense:  30 tablet    Refill:  5  . hydrochlorothiazide (HYDRODIURIL) 25 MG tablet    Sig: Take 1 tablet (25 mg total) by mouth daily.    Dispense:  30 tablet    Refill:  5    Please D/C order for 12.5mg  tablets    Orders Placed This Encounter  Procedures  . POCT glycosylated hemoglobin (Hb A1C)

## 2014-02-19 NOTE — Assessment & Plan Note (Signed)
BP Readings from Last 3 Encounters:  02/19/14 190/97  01/17/14 183/89  01/03/14 110/61    Lab Results  Component Value Date   NA 142 05/20/2013   K 4.3 05/20/2013   CREATININE 0.84 05/20/2013    Assessment: Blood pressure control: moderately elevated Progress toward BP goal:  unchanged Comments:   Plan: Medications:  Lisinopril 40mg  daily, will start HCTZ 25mg  daily Educational resources provided: brochure Self management tools provided: home blood pressure logbook Other plans: Adding HCTZ, patient instructed to maintain low salt diet.  She will follow up in 2 weeks for recheck of BP and review of blood glucose log.

## 2014-02-19 NOTE — Assessment & Plan Note (Signed)
Patient reports abnormal Mammogram at mobile mammogram screening recently.  We are requesting records. She reports she has a diagnostic mammogram follow up scheduled but not sure of when.  Zigmund Daniel is working on tracking this down too. Patient reports she had a colonoscopy at around age 63 and one polyp was removed.  She thinks she was supposed to follow up in 3 or 5 years, but lost insurance.  She will try to bring the name of the GI who did the colonoscopy at that time so we can refer for follow up at next visit in 2 weeks. She has an eye appointment scheduled. She received TDap and Influenza vaccinations today.

## 2014-02-20 NOTE — Progress Notes (Signed)
Case discussed with Dr. Hoffman at the time of the visit.  We reviewed the resident's history and exam and pertinent patient test results.  I agree with the assessment, diagnosis, and plan of care documented in the resident's note. 

## 2014-03-05 ENCOUNTER — Ambulatory Visit: Payer: No Typology Code available for payment source | Admitting: Internal Medicine

## 2014-03-07 ENCOUNTER — Ambulatory Visit: Payer: No Typology Code available for payment source | Admitting: Internal Medicine

## 2014-03-07 ENCOUNTER — Other Ambulatory Visit: Payer: Self-pay | Admitting: Obstetrics & Gynecology

## 2014-03-07 DIAGNOSIS — R922 Inconclusive mammogram: Secondary | ICD-10-CM

## 2014-03-10 ENCOUNTER — Ambulatory Visit: Payer: No Typology Code available for payment source | Admitting: Internal Medicine

## 2014-03-14 ENCOUNTER — Encounter: Payer: Self-pay | Admitting: Internal Medicine

## 2014-03-14 ENCOUNTER — Ambulatory Visit (INDEPENDENT_AMBULATORY_CARE_PROVIDER_SITE_OTHER): Payer: No Typology Code available for payment source | Admitting: Internal Medicine

## 2014-03-14 VITALS — BP 142/81 | HR 86 | Temp 97.3°F | Ht 62.0 in | Wt 140.0 lb

## 2014-03-14 DIAGNOSIS — E1165 Type 2 diabetes mellitus with hyperglycemia: Secondary | ICD-10-CM

## 2014-03-14 DIAGNOSIS — I10 Essential (primary) hypertension: Secondary | ICD-10-CM

## 2014-03-14 DIAGNOSIS — E1139 Type 2 diabetes mellitus with other diabetic ophthalmic complication: Secondary | ICD-10-CM

## 2014-03-14 DIAGNOSIS — E785 Hyperlipidemia, unspecified: Secondary | ICD-10-CM

## 2014-03-14 NOTE — Progress Notes (Signed)
[facility] Subjective:   Patient ID: Amanda [name] female   DOB: 07/23/1951 63 y.o.   MRN: [id number]  HPI: Amanda [name] is a 63 y.o. female with a PMH significant for T2DM, HTN, HLD. She presents today for 3 week follow up of blood pressure and blood sugar, at last visit HCTZ was added and metformin was increased.  In the interim she reports she saw Dr. Katy Fitch for her eye exam and was told she had diabetic retinopathy and has since started treatment with Dr. Zadie Rhine.  For her blood glucose she reports her AM sugars have been much better controlled with many readings 110s-140s  She does note that she has had 2 readings below 70. One of 58 and one of 68.  On review of her meter I do not see these readings and she reports they may have been before the last visit.  She has been eating more regular meals (2 to 3 full meals) and occasional snacks.  For her BP she reports she has been taking HCTZ most days occasionally she does note take it if she feels "like I'm drying out."  She has checked her BP on a few occasions at [facility] and reports numbers in 140s/80s.  Past Medical History  Diagnosis Date  . Diabetes mellitus   . Hypotension   . Influenza A (H1N1) 01/03/2013  . DKA (diabetic ketoacidoses) 01/02/2013  . Anemia   . SYNCOPE 05/10/2010    Qualifier: Diagnosis of  By: Marilynne Halsted, RN, BSN, Jacquelyn    . Hypertension   . Hyperlipidemia    Current Outpatient Prescriptions  Medication Sig Dispense Refill  . acetaminophen (TYLENOL) 325 MG tablet Take 2 tablets (650 mg total) by mouth every 6 (six) hours as needed (or Fever >/= 101).  60 tablet  0  . aspirin 81 MG tablet Take 81 mg by mouth daily.      [name] gabapentin (NEURONTIN) 100 MG capsule Take 1 capsule (100 mg total) by mouth 3 (three) times daily.  90 capsule  2  . hydrochlorothiazide (HYDRODIURIL) 25 MG tablet Take 1 tablet (25 mg total) by mouth daily.  30 tablet  5  . ibuprofen (ADVIL,MOTRIN) 200 MG tablet Take  400 mg by mouth every 6 (six) hours as needed.      . Insulin Detemir (LEVEMIR) 100 UNIT/ML Pen Inject 6 Units into the skin daily.  15 mL  5  . lisinopril (PRINIVIL,ZESTRIL) 40 MG tablet Take 1 tablet (40 mg total) by mouth daily.  30 tablet  2  . metFORMIN (GLUCOPHAGE) 500 MG tablet Take 2 tablets (1,000 mg total) by mouth 2 (two) times daily with a meal.  120 tablet  5  . mometasone (NASONEX) 50 MCG/ACT nasal spray Place 2 sprays into the nose daily.  17 g  11  . omeprazole (PRILOSEC) 40 MG capsule Take 1 capsule (40 mg total) by mouth daily.  30 capsule  1  . polyethylene glycol (MIRALAX) packet Take 17 g by mouth daily.  14 each  0  . pravastatin (PRAVACHOL) 20 MG tablet Take 1 tablet (20 mg total) by mouth daily.  30 tablet  2   No current facility-administered medications for this visit.   Family History  Problem Relation Age of Onset  . Diabetes Mother   . Hypertension Mother   . Aneurysm Father   . Diabetes Sister   . Hypertension Sister   . Kidney disease Sister     One Kidney transplant,  one sister on HD   History   Social History  . Marital Status: Married    Spouse Name: N/A    Number of Children: N/A  . Years of Education: N/A   Social History Main Topics  . Smoking status: Never Smoker   . Smokeless tobacco: Never Used  . Alcohol Use: No  . Drug Use: No  . Sexual Activity: None   Other Topics Concern  . None   Social History Narrative  . None   Review of Systems: Review of Systems  Constitutional: Negative for malaise/fatigue.  Eyes: Negative for blurred vision.  Respiratory: Negative for shortness of breath.   Cardiovascular: Negative for chest pain.  Gastrointestinal: Negative for abdominal pain.  Neurological: Negative for dizziness.  Endo/Heme/Allergies: Negative for polydipsia.    Objective:  Physical Exam: Filed Vitals:   03/14/14 1349  BP: 142/81  Pulse: 86  Temp: 97.3 F (36.3 C)  TempSrc: Oral  Height: 5\' 2"  (1.575 m)  Weight: 140  lb (63.504 kg)  SpO2: 100%  Physical Exam  Nursing note and vitals reviewed. Constitutional: She is well-developed, well-nourished, and in no distress.  HENT:  Head: Normocephalic and atraumatic.  Cardiovascular: Normal rate, regular rhythm and normal heart sounds.   Pulmonary/Chest: Effort normal and breath sounds normal.  Abdominal: Soft. Bowel sounds are normal.  Skin:  Monofilament exam preformed with sensation intact   Assessment & Plan:  Case discussed with Dr. Lynnae January See Problem Based Assessment and Plan Medications Ordered No orders of the defined types were placed in this encounter.   Other Orders Orders Placed This Encounter  Procedures  . HM Diabetes Foot Exam

## 2014-03-14 NOTE — Patient Instructions (Signed)
Please check you blood sugars as directed over the weekend. Remember to come fasting on Tuesday morning.  Please bring your medicines with you each time you come.   Medicines may be  Eye drops  Herbal   Vitamins  Pills  Seeing these help [place] take care of you.

## 2014-03-15 ENCOUNTER — Encounter: Payer: Self-pay | Admitting: Internal Medicine

## 2014-03-15 NOTE — Assessment & Plan Note (Signed)
BP Readings from Last 3 Encounters:  03/14/14 142/81  02/19/14 190/97  01/17/14 183/89    Lab Results  Component Value Date   NA 142 05/20/2013   K 4.3 05/20/2013   CREATININE 0.84 05/20/2013    Assessment: Blood pressure control:  controlled Progress toward BP goal:   at goal Comments:   Plan: Medications:  Lisinopril 40mg  daily, HCTZ 25mg  daily Educational resources provided: brochure;handout Self management tools provided: home blood pressure logbook Other plans: Encouraged patient to take HCTZ every day and she will return in 3 days.

## 2014-03-15 NOTE — Assessment & Plan Note (Signed)
Given patient's BMI of 25, Hx of DKA, low insulin requirement (6 units), and multiple complications I am concerned she may be a type 1 diabetic.  She reports she was diagnosed with Diabetes in 1998 and was only started on insulin a few years ago. - Will check GAD65 antibody, anti islet cell antibody, and fasting insulin. (Orders placed or patient to have drawn on Tuesday morning) - Patient given printout of Accucheck 360 view worksheet and instructed to check BS 7 times a day for the next 3 days. - Will have patient follow up on Tuesday morning to review glucose log. - Will check BMP to evaluate current renal function.

## 2014-03-15 NOTE — Assessment & Plan Note (Signed)
Repeat lipid panel Goal LDL <100. Continue Pravastatin 20mg 

## 2014-03-17 ENCOUNTER — Other Ambulatory Visit: Payer: No Typology Code available for payment source

## 2014-03-17 NOTE — Progress Notes (Signed)
Case discussed with Dr. Hoffman at the time of the visit.  We reviewed the resident's history and exam and pertinent patient test results.  I agree with the assessment, diagnosis, and plan of care documented in the resident's note. 

## 2014-03-18 ENCOUNTER — Ambulatory Visit (INDEPENDENT_AMBULATORY_CARE_PROVIDER_SITE_OTHER): Payer: No Typology Code available for payment source | Admitting: Internal Medicine

## 2014-03-18 ENCOUNTER — Encounter: Payer: Self-pay | Admitting: Internal Medicine

## 2014-03-18 VITALS — BP 126/80 | HR 91 | Temp 98.2°F | Wt 140.2 lb

## 2014-03-18 DIAGNOSIS — E1165 Type 2 diabetes mellitus with hyperglycemia: Principal | ICD-10-CM

## 2014-03-18 DIAGNOSIS — E1139 Type 2 diabetes mellitus with other diabetic ophthalmic complication: Secondary | ICD-10-CM

## 2014-03-18 DIAGNOSIS — E11319 Type 2 diabetes mellitus with unspecified diabetic retinopathy without macular edema: Secondary | ICD-10-CM | POA: Insufficient documentation

## 2014-03-18 DIAGNOSIS — E785 Hyperlipidemia, unspecified: Secondary | ICD-10-CM

## 2014-03-18 DIAGNOSIS — Z0000 Encounter for general adult medical examination without abnormal findings: Secondary | ICD-10-CM

## 2014-03-18 LAB — LIPID PANEL
Cholesterol: 160 mg/dL (ref 0–200)
HDL: 63 mg/dL (ref >39)
LDL Cholesterol: 88 mg/dL (ref 0–99)
Total CHOL/HDL Ratio: 2.5 Ratio
Triglycerides: 43 mg/dL (ref <150)
VLDL: 9 mg/dL (ref 0–40)

## 2014-03-18 LAB — BASIC METABOLIC PANEL WITH GFR
BUN: 29 mg/dL — ABNORMAL HIGH (ref 6–23)
CO2: 25 mEq/L (ref 19–32)
Calcium: 9.1 mg/dL (ref 8.4–10.5)
Chloride: 107 mEq/L (ref 96–112)
Creat: 0.87 mg/dL (ref 0.50–1.10)
GFR, Est African American: 83 mL/min
GFR, Est Non African American: 72 mL/min
Glucose, Bld: 163 mg/dL — ABNORMAL HIGH (ref 70–99)
Potassium: 4.2 mEq/L (ref 3.5–5.3)
Sodium: 143 mEq/L (ref 135–145)

## 2014-03-18 MED ORDER — INSULIN ASPART 100 UNIT/ML FLEXPEN: 2.0000 [IU] | PEN_INJECTOR | Freq: Every day | SUBCUTANEOUS | Status: DC

## 2014-03-18 MED ORDER — WOMENS 50+ MULTI VITAMIN/MIN PO TABS: 1.0000 | ORAL_TABLET | Freq: Every day | ORAL | Status: AC

## 2014-03-18 NOTE — Patient Instructions (Signed)
You can start taking a Women's 50+ multivitamin ([facility] has them) You can also get Claritin (loratadine) or Zyrtec (certrizine) or allergies at [facility]  Please bring your medicines with you each time you come.   Medicines may be  Eye drops  Herbal   Vitamins  Pills  Seeing these help [place] take care of you.

## 2014-03-18 NOTE — Assessment & Plan Note (Signed)
LDL at goal Continue Pravastatin 20mg 

## 2014-03-18 NOTE — Progress Notes (Signed)
[facility] Subjective:   Patient ID: Amanda [name] female   DOB: 1951-12-12 63 y.o.   MRN: [id number]  HPI: Amanda [name] is a 63 y.o. female with a PMH significant for DM, HTN, HLD. At our last visit we discussed the possibility that she may have adult onset autoimmune diabetes which may explain her variable blood sugars, Hx of DKA, low isulin requirements and multiple complications.  She recording her BS prior to each meal and 2 hours after and at bedtime over the past 3 days.  This recordings show AM sugars consistently of 140s, post meal ~150s, lunch 140-150, post lunch 160s, pre dinner 160s and post dinner ~200s and greater and bedtime of 160s. She does reports she has been taking HCTZ every day.  Past Medical History  Diagnosis Date  . Diabetes mellitus   . Hypotension   . Influenza A (H1N1) 01/03/2013  . DKA (diabetic ketoacidoses) 01/02/2013  . Anemia   . SYNCOPE 05/10/2010    Qualifier: Diagnosis of  By: Marilynne Halsted, RN, BSN, Jacquelyn    . Hypertension   . Hyperlipidemia    Current Outpatient Prescriptions  Medication Sig Dispense Refill  . acetaminophen (TYLENOL) 325 MG tablet Take 2 tablets (650 mg total) by mouth every 6 (six) hours as needed (or Fever >/= 101).  60 tablet  0  . aspirin 81 MG tablet Take 81 mg by mouth daily.      . hydrochlorothiazide (HYDRODIURIL) 25 MG tablet Take 1 tablet (25 mg total) by mouth daily.  30 tablet  5  . ibuprofen (ADVIL,MOTRIN) 200 MG tablet Take 400 mg by mouth every 6 (six) hours as needed.      . Insulin Detemir (LEVEMIR) 100 UNIT/ML Pen Inject 6 Units into the skin daily.  15 mL  5  . lisinopril (PRINIVIL,ZESTRIL) 40 MG tablet Take 1 tablet (40 mg total) by mouth daily.  30 tablet  2  . metFORMIN (GLUCOPHAGE) 500 MG tablet Take 2 tablets (1,000 mg total) by mouth 2 (two) times daily with a meal.  120 tablet  5  . mometasone (NASONEX) 50 MCG/ACT nasal spray Place 2 sprays into the nose daily.  17 g  11  .  omeprazole (PRILOSEC) 40 MG capsule Take 1 capsule (40 mg total) by mouth daily.  30 capsule  1  . polyethylene glycol (MIRALAX) packet Take 17 g by mouth daily.  14 each  0  . pravastatin (PRAVACHOL) 20 MG tablet Take 1 tablet (20 mg total) by mouth daily.  30 tablet  2   No current facility-administered medications for this visit.   Family History  Problem Relation Age of Onset  . Diabetes Mother   . Hypertension Mother   . Aneurysm Father   . Diabetes Sister   . Hypertension Sister   . Kidney disease Sister     One Kidney transplant, one sister on HD   History   Social History  . Marital Status: Married    Spouse Name: N/A    Number of Children: N/A  . Years of Education: N/A   Social History Main Topics  . Smoking status: Never Smoker   . Smokeless tobacco: Never Used  . Alcohol Use: No  . Drug Use: No  . Sexual Activity: None   Other Topics Concern  . None   Social History Narrative  . None   Review of Systems: Review of Systems  Constitutional: Negative for malaise/fatigue.  Eyes: Negative for blurred vision.  Respiratory: Negative for shortness of breath.   Cardiovascular: Negative for chest pain.  Gastrointestinal: Negative for abdominal pain.  Neurological: Negative for dizziness.  Endo/Heme/Allergies: Negative for polydipsia.    Objective:  Physical Exam: Filed Vitals:   03/18/14 0926  BP: 177/92  Pulse: 96  Temp: 98.2 F (36.8 C)  TempSrc: Oral  Weight: 140 lb 3.2 oz (63.594 kg)  SpO2: 100%  Physical Exam  Nursing note and vitals reviewed. Constitutional: She is well-developed, well-nourished, and in no distress.  HENT:  Head: Normocephalic and atraumatic.  Cardiovascular: Normal rate, regular rhythm and normal heart sounds.   Pulmonary/Chest: Effort normal and breath sounds normal.  Abdominal: Soft. Bowel sounds are normal.   Assessment & Plan:  Case discussed with Dr. Eppie Gibson See Problem Based Assessment and Plan Medications  Ordered Meds ordered this encounter  Medications  . insulin aspart (NOVOLOG) 100 UNIT/ML FlexPen    Sig: Inject 2 Units into the skin daily before supper.    Dispense:  15 mL    Refill:  11  . Multiple Vitamins-Minerals (WOMENS 50+ MULTI VITAMIN/MIN) TABS    Sig: Take 1 tablet by mouth daily.    Dispense:  30 tablet   Other Orders Orders Placed This Encounter  Procedures  . DG Bone Density    Standing Status: Future     Number of Occurrences:      Standing Expiration Date: 05/19/2015    Order Specific Question:  Reason for Exam (SYMPTOM  OR DIAGNOSIS REQUIRED)    Answer:  screening    Order Specific Question:  Preferred imaging location?    Answer:  [facility]  . Glutamic acid decarboxylase auto abs

## 2014-03-18 NOTE — Assessment & Plan Note (Signed)
Blood sugars are pretty well controlled with Levemir 6 units.  It appears supper is when patient has largest spike in blood sugar.  She report she has tried a SSI before but has become hypoglycemic with that.  For now will try to keep her regimen simple with just adding Novolog 2 units before supper.  Will continue metformin pending labwork.  GAD65 ab, anti islet cell ab and fasting insulin are pending.

## 2014-03-18 NOTE — Assessment & Plan Note (Signed)
Patient undergoing treatment with Dr. Zadie Rhine. BP well controlled here today, will need to maintain good BP control to prevent progression.

## 2014-03-18 NOTE — Assessment & Plan Note (Signed)
Will refer for Dexa today. Patient to have follow up diagnostic mammogram this week.

## 2014-03-19 LAB — INSULIN, FASTING: Insulin fasting, serum: 6 u[IU]/mL (ref 3–28)

## 2014-03-20 LAB — GLUTAMIC ACID DECARBOXYLASE AUTO ABS: Glutamic Acid Decarb Ab: <1 U/mL (ref <=1.0)

## 2014-03-20 NOTE — Progress Notes (Signed)
Case discussed with Dr. Hoffman at the time of the visit.  We reviewed the resident's history and exam and pertinent patient test results.  I agree with the assessment, diagnosis and plan of care documented in the resident's note. 

## 2014-03-26 LAB — ANTI-ISLET CELL ANTIBODY: Pancreatic Islet Cell Antibody: <5 JDF Units (ref <5)

## 2014-03-28 ENCOUNTER — Other Ambulatory Visit: Payer: No Typology Code available for payment source

## 2014-04-02 ENCOUNTER — Ambulatory Visit
Admission: RE | Admit: 2014-04-02 | Discharge: 2014-04-02 | Disposition: A | Discharge status: Home / Self Care | Payer: No Typology Code available for payment source | Source: Ambulatory Visit | Attending: Obstetrics & Gynecology | Admitting: Obstetrics & Gynecology

## 2014-04-02 ENCOUNTER — Other Ambulatory Visit: Payer: Self-pay | Admitting: Obstetrics & Gynecology

## 2014-04-02 DIAGNOSIS — R922 Inconclusive mammogram: Secondary | ICD-10-CM

## 2014-04-05 ENCOUNTER — Encounter (HOSPITAL_COMMUNITY): Payer: Self-pay | Admitting: Emergency Medicine

## 2014-04-05 ENCOUNTER — Emergency Department (HOSPITAL_COMMUNITY)
Admission: EM | Admit: 2014-04-05 | Discharge: 2014-04-05 | Disposition: A | Discharge status: Home / Self Care | Payer: No Typology Code available for payment source | Attending: Emergency Medicine | Admitting: Emergency Medicine

## 2014-04-05 DIAGNOSIS — E785 Hyperlipidemia, unspecified: Secondary | ICD-10-CM | POA: Insufficient documentation

## 2014-04-05 DIAGNOSIS — R42 Dizziness and giddiness: Secondary | ICD-10-CM | POA: Insufficient documentation

## 2014-04-05 DIAGNOSIS — Z79899 Other long term (current) drug therapy: Secondary | ICD-10-CM | POA: Insufficient documentation

## 2014-04-05 DIAGNOSIS — Z7982 Long term (current) use of aspirin: Secondary | ICD-10-CM | POA: Insufficient documentation

## 2014-04-05 DIAGNOSIS — E119 Type 2 diabetes mellitus without complications: Secondary | ICD-10-CM | POA: Insufficient documentation

## 2014-04-05 DIAGNOSIS — R51 Headache: Secondary | ICD-10-CM | POA: Insufficient documentation

## 2014-04-05 DIAGNOSIS — Z794 Long term (current) use of insulin: Secondary | ICD-10-CM | POA: Insufficient documentation

## 2014-04-05 DIAGNOSIS — IMO0002 Reserved for concepts with insufficient information to code with codable children: Secondary | ICD-10-CM | POA: Insufficient documentation

## 2014-04-05 DIAGNOSIS — E860 Dehydration: Secondary | ICD-10-CM | POA: Insufficient documentation

## 2014-04-05 DIAGNOSIS — I10 Essential (primary) hypertension: Secondary | ICD-10-CM | POA: Insufficient documentation

## 2014-04-05 DIAGNOSIS — Z8709 Personal history of other diseases of the respiratory system: Secondary | ICD-10-CM | POA: Insufficient documentation

## 2014-04-05 LAB — CBC
HCT: 33.2 % — ABNORMAL LOW (ref 36.0–46.0)
Hemoglobin: 11.7 g/dL — ABNORMAL LOW (ref 12.0–15.0)
MCH: 30.1 pg (ref 26.0–34.0)
MCHC: 35.2 g/dL (ref 30.0–36.0)
MCV: 85.3 fL (ref 78.0–100.0)
Platelets: 223 10*3/uL (ref 150–400)
RBC: 3.89 MIL/uL (ref 3.87–5.11)
RDW: 12.8 % (ref 11.5–15.5)
WBC: 3.4 10*3/uL — ABNORMAL LOW (ref 4.0–10.5)

## 2014-04-05 LAB — URINALYSIS, ROUTINE W REFLEX MICROSCOPIC
Bilirubin Urine: NEGATIVE
Glucose, UA: NEGATIVE mg/dL
Ketones, ur: NEGATIVE mg/dL
Leukocytes, UA: NEGATIVE
Nitrite: NEGATIVE
Protein, ur: 30 mg/dL — AB
Specific Gravity, Urine: 1.014 (ref 1.005–1.030)
Urobilinogen, UA: 0.2 mg/dL (ref 0.0–1.0)
pH: 5 (ref 5.0–8.0)

## 2014-04-05 LAB — BASIC METABOLIC PANEL
BUN: 26 mg/dL — ABNORMAL HIGH (ref 6–23)
CO2: 22 mEq/L (ref 19–32)
Calcium: 8.8 mg/dL (ref 8.4–10.5)
Chloride: 103 mEq/L (ref 96–112)
Creatinine, Ser: 1.1 mg/dL (ref 0.50–1.10)
GFR calc Af Amer: 61 mL/min — ABNORMAL LOW (ref >90)
GFR calc non Af Amer: 53 mL/min — ABNORMAL LOW (ref >90)
Glucose, Bld: 141 mg/dL — ABNORMAL HIGH (ref 70–99)
Potassium: 3.8 mEq/L (ref 3.7–5.3)
Sodium: 139 mEq/L (ref 137–147)

## 2014-04-05 LAB — URINE MICROSCOPIC-ADD ON

## 2014-04-05 MED ORDER — ONDANSETRON HCL 4 MG PO TABS: 4.0000 mg | ORAL_TABLET | Freq: Four times a day (QID) | ORAL | Status: DC

## 2014-04-05 MED ORDER — ACETAMINOPHEN 500 MG PO TABS
1000.0000 mg | ORAL_TABLET | Freq: Once | ORAL | Status: AC
  Administered 2014-04-05: 1000 mg via ORAL
  Filled 2014-04-05: qty 2

## 2014-04-05 MED ORDER — SODIUM CHLORIDE 0.9 % IV BOLUS (SEPSIS)
1000.0000 mL | Freq: Once | INTRAVENOUS | Status: AC
  Administered 2014-04-05: 1000 mL via INTRAVENOUS

## 2014-04-05 NOTE — ED Notes (Signed)
Pt aware of need to provide urine.

## 2014-04-05 NOTE — ED Notes (Addendum)
Pt states she had a bout of diarrhea Thursday.  Pt states since yesterday she's had general weakness, dizziness, and chills.  Pt alert and oriented.  Pt initially denied pain then her friend asked her about her headache.  Pt now c/o headache as well.

## 2014-04-05 NOTE — ED Provider Notes (Signed)
CSN: [id number]     Arrival date & time 04/05/14  1317 History   First MD Initiated Contact with Patient 04/05/14 1506     Chief Complaint  Patient presents with  . Weakness  . Dizziness     (Consider location/radiation/quality/duration/timing/severity/associated sxs/prior Treatment) Patient is a 63 y.o. female presenting with weakness and dizziness. The history is provided by the patient. No language interpreter was used.  Weakness This is a new problem. The current episode started yesterday. Associated symptoms include weakness. Associated symptoms comments: She describes generalized dizziness and weakness without fever, or fall. She states she had a diarrheal illness 2 days prior to that consisting of mild nausea without vomiting and >10 non-bloody stools over 2 days. She denies history of vertigo. No sick contacts. She is a Type 2 DM who reports her blood sugar is usually well controlled. Currently, while supine, she feels well and is asymptomatic. She denies pain, SOB, cough, dysuria..  Dizziness   Past Medical History  Diagnosis Date  . Diabetes mellitus   . Hypotension   . Influenza A (H1N1) 01/03/2013  . DKA (diabetic ketoacidoses) 01/02/2013  . Anemia   . SYNCOPE 05/10/2010    Qualifier: Diagnosis of  By: Marilynne Halsted, RN, BSN, Jacquelyn    . Hypertension   . Hyperlipidemia    Past Surgical History  Procedure Laterality Date  . Rotator cuff repair    . Abdominal hysterectomy      10 years ago   . Shoulder surgery      Rotator cuff tear    Family History  Problem Relation Age of Onset  . Diabetes Mother   . Hypertension Mother   . Aneurysm Father   . Diabetes Sister   . Hypertension Sister   . Kidney disease Sister     One Kidney transplant, one sister on HD   History  Substance Use Topics  . Smoking status: Never Smoker   . Smokeless tobacco: Never Used  . Alcohol Use: No   OB History   Grav Para Term Preterm Abortions TAB SAB Ect Mult Living                  Review of Systems  Neurological: Positive for dizziness and weakness.      Allergies  Gabapentin  Home Medications   Prior to Admission medications   Medication Sig Start Date End Date Taking? Authorizing Provider  aspirin 81 MG tablet Take 81 mg by mouth daily.   Yes Historical Provider, MD  hydrochlorothiazide (HYDRODIURIL) 25 MG tablet Take 1 tablet (25 mg total) by mouth daily. 02/19/14  Yes Joni Reining, DO  ibuprofen (ADVIL,MOTRIN) 200 MG tablet Take 400 mg by mouth every 6 (six) hours as needed.   Yes Historical Provider, MD  insulin aspart (NOVOLOG) 100 UNIT/ML FlexPen Inject 2 Units into the skin daily before supper. 03/18/14  Yes Joni Reining, DO  Insulin Detemir (LEVEMIR) 100 UNIT/ML Pen Inject 6 Units into the skin daily. 02/19/14  Yes Joni Reining, DO  lisinopril (PRINIVIL,ZESTRIL) 40 MG tablet Take 1 tablet (40 mg total) by mouth daily. 01/03/14  Yes Duwaine Maxin, DO  metFORMIN (GLUCOPHAGE) 500 MG tablet Take 2 tablets (1,000 mg total) by mouth 2 (two) times daily with a meal. 02/19/14  Yes Joni Reining, DO  mometasone (NASONEX) 50 MCG/ACT nasal spray Place 2 sprays into the nose daily. 12/02/13  Yes Juluis Mire, MD  Multiple Vitamins-Minerals (WOMENS 50+ MULTI VITAMIN/MIN) TABS Take 1 tablet by mouth daily. 03/18/14  Yes Joni Reining, DO  omeprazole (PRILOSEC) 40 MG capsule Take 1 capsule (40 mg total) by mouth daily. 01/17/14  Yes Duwaine Maxin, DO  polyethylene glycol [facility]) packet Take 17 g by mouth daily. 01/03/14  Yes Duwaine Maxin, DO  pravastatin (PRAVACHOL) 20 MG tablet Take 1 tablet (20 mg total) by mouth daily. 01/03/14  Yes Duwaine Maxin, DO  acetaminophen (TYLENOL) 325 MG tablet Take 2 tablets (650 mg total) by mouth every 6 (six) hours as needed (or Fever >/= 101). 01/05/13   Jeralene Huff, MD   BP 147/68  Pulse 79  Temp(Src) 98.1 F (36.7 C) (Oral)  Resp 18  SpO2 96% Physical Exam  Constitutional: She is oriented to person, place, and time. She appears  well-developed and well-nourished.  HENT:  Head: Normocephalic.  Neck: Normal range of motion. Neck supple.  Cardiovascular: Normal rate and regular rhythm.   Pulmonary/Chest: Effort normal and breath sounds normal.  Abdominal: Soft. Bowel sounds are normal. There is no tenderness. There is no rebound and no guarding.  Musculoskeletal: Normal range of motion.  Neurological: She is alert and oriented to person, place, and time. Coordination normal.  CN's 3-12 grossly intact. No pronator drift.   Skin: Skin is warm and dry. No rash noted.  Psychiatric: She has a normal mood and affect.    ED Course  Procedures (including critical care time) Labs Review Labs Reviewed  CBC - Abnormal; Notable for the following:    WBC 3.4 (*)    Hemoglobin 11.7 (*)    HCT 33.2 (*)    All other components within normal limits  BASIC METABOLIC PANEL - Abnormal; Notable for the following:    Glucose, Bld 141 (*)    BUN 26 (*)    GFR calc non Af Amer 53 (*)    GFR calc Af Amer 61 (*)    All other components within normal limits  URINALYSIS, ROUTINE W REFLEX MICROSCOPIC   Results for orders placed during the hospital encounter of 04/05/14  CBC      Result Value Ref Range   WBC 3.4 (*) 4.0 - 10.5 K/uL   RBC 3.89  3.87 - 5.11 MIL/uL   Hemoglobin 11.7 (*) 12.0 - 15.0 g/dL   HCT 33.2 (*) 36.0 - 46.0 %   MCV 85.3  78.0 - 100.0 fL   MCH 30.1  26.0 - 34.0 pg   MCHC 35.2  30.0 - 36.0 g/dL   RDW 12.8  11.5 - 15.5 %   Platelets 223  150 - 400 K/uL  BASIC METABOLIC PANEL      Result Value Ref Range   Sodium 139  137 - 147 mEq/L   Potassium 3.8  3.7 - 5.3 mEq/L   Chloride 103  96 - 112 mEq/L   CO2 22  19 - 32 mEq/L   Glucose, Bld 141 (*) 70 - 99 mg/dL   BUN 26 (*) 6 - 23 mg/dL   Creatinine, Ser 1.10  0.50 - 1.10 mg/dL   Calcium 8.8  8.4 - 10.5 mg/dL   GFR calc non Af Amer 53 (*) >90 mL/min   GFR calc Af Amer 61 (*) >90 mL/min  URINALYSIS, ROUTINE W REFLEX MICROSCOPIC      Result Value Ref Range    Color, Urine YELLOW  YELLOW   APPearance CLEAR  CLEAR   Specific Gravity, Urine 1.014  1.005 - 1.030   pH 5.0  5.0 - 8.0   Glucose, UA NEGATIVE  NEGATIVE  mg/dL   Hgb urine dipstick TRACE (*) NEGATIVE   Bilirubin Urine NEGATIVE  NEGATIVE   Ketones, ur NEGATIVE  NEGATIVE mg/dL   Protein, ur 30 (*) NEGATIVE mg/dL   Urobilinogen, UA 0.2  0.0 - 1.0 mg/dL   Nitrite NEGATIVE  NEGATIVE   Leukocytes, UA NEGATIVE  NEGATIVE  URINE MICROSCOPIC-ADD ON      Result Value Ref Range   RBC / HPF 0-2  <3 RBC/hpf   Casts HYALINE CASTS (*) NEGATIVE    Imaging Review No results found.   EKG Interpretation None      MDM   Final diagnoses:  None    1. Lightheadedness 2. Mild dehydration  She is feeling improved with fluids. Ambulatory to the bathroom with less lightheadedness or dizziness. She is eating and drinking without problem. Re-evaluation shows no change in neurologic exam. No ataxia with ambulation.   Dr. Jeanell Sparrow in to see the patient. Feels she is appropriate for discharge home.     Dewaine Oats, PA-C 04/05/14 1819

## 2014-04-05 NOTE — ED Notes (Signed)
Ambulated in the hallway to the bathroom. 150ft. No dizzyness or any problems

## 2014-04-05 NOTE — Discharge Instructions (Signed)
Dehydration, Adult Dehydration is when you lose more fluids from the body than you take in. Vital organs like the kidneys, brain, and heart cannot function without a proper amount of fluids and salt. Any loss of fluids from the body can cause dehydration.  CAUSES   Vomiting.  Diarrhea.  Excessive sweating.  Excessive urine output.  Fever. SYMPTOMS  Mild dehydration  Thirst.  Dry lips.  Slightly dry mouth. Moderate dehydration  Very dry mouth.  Sunken eyes.  Skin does not bounce back quickly when lightly pinched and released.  Dark urine and decreased urine production.  Decreased tear production.  Headache. Severe dehydration  Very dry mouth.  Extreme thirst.  Rapid, weak pulse (more than 100 beats per minute at rest).  Cold hands and feet.  Not able to sweat in spite of heat and temperature.  Rapid breathing.  Blue lips.  Confusion and lethargy.  Difficulty being awakened.  Minimal urine production.  No tears. DIAGNOSIS  Your caregiver will diagnose dehydration based on your symptoms and your exam. Blood and urine tests will help confirm the diagnosis. The diagnostic evaluation should also identify the cause of dehydration. TREATMENT  Treatment of mild or moderate dehydration can often be done at home by increasing the amount of fluids that you drink. It is best to drink small amounts of fluid more often. Drinking too much at one time can make vomiting worse. Refer to the home care instructions below. Severe dehydration needs to be treated at the hospital where you will probably be given intravenous (IV) fluids that contain water and electrolytes. HOME CARE INSTRUCTIONS   Ask your caregiver about specific rehydration instructions.  Drink enough fluids to keep your urine clear or pale yellow.  Drink small amounts frequently if you have nausea and vomiting.  Eat as you normally do.  Avoid:  Foods or drinks high in sugar.  Carbonated  drinks.  Juice.  Extremely hot or cold fluids.  Drinks with caffeine.  Fatty, greasy foods.  Alcohol.  Tobacco.  Overeating.  Gelatin desserts.  Wash your hands well to avoid spreading bacteria and viruses.  Only take over-the-counter or prescription medicines for pain, discomfort, or fever as directed by your caregiver.  Ask your caregiver if you should continue all prescribed and over-the-counter medicines.  Keep all follow-up appointments with your caregiver. SEEK MEDICAL CARE IF:  You have abdominal pain and it increases or stays in one area (localizes).  You have a rash, stiff neck, or severe headache.  You are irritable, sleepy, or difficult to awaken.  You are weak, dizzy, or extremely thirsty. SEEK IMMEDIATE MEDICAL CARE IF:   You are unable to keep fluids down or you get worse despite treatment.  You have frequent episodes of vomiting or diarrhea.  You have blood or green matter (bile) in your vomit.  You have blood in your stool or your stool looks black and tarry.  You have not urinated in 6 to 8 hours, or you have only urinated a small amount of very dark urine.  You have a fever.  You faint. MAKE SURE YOU:   Understand these instructions.  Will watch your condition.  Will get help right away if you are not doing well or get worse. Document Released: 12/05/2005 Document Revised: 02/27/2012 Document Reviewed: 07/25/2011 [employer] Patient Information 2014 [employer].  

## 2014-04-06 NOTE — ED Provider Notes (Signed)
63 y.o. Female with lightheadednss and multiple episodes of diarrhea recently.  PE- Neuro exam- normal.  No nystagmus or ataxia.    Suspect some volume depletion.   I performed a history and physical examination of MERAL MORAVEC and discussed her management with ms. Upstill.  I agree with the history, physical, assessment, and plan of care, with the following exceptions: None  I was present for the following procedures: None Time Spent in Critical Care of the patient: None Time spent in discussions with the patient and family: [place], MD 04/06/14 [phone]

## 2014-04-07 ENCOUNTER — Encounter: Payer: Self-pay | Admitting: Internal Medicine

## 2014-04-08 ENCOUNTER — Encounter: Payer: Self-pay | Admitting: Gastroenterology

## 2014-04-28 ENCOUNTER — Other Ambulatory Visit: Payer: Self-pay | Admitting: *Deleted

## 2014-04-28 DIAGNOSIS — I10 Essential (primary) hypertension: Secondary | ICD-10-CM

## 2014-04-28 MED ORDER — LISINOPRIL 40 MG PO TABS: 40.0000 mg | ORAL_TABLET | Freq: Every day | ORAL | Status: DC

## 2014-04-30 ENCOUNTER — Ambulatory Visit: Payer: No Typology Code available for payment source | Admitting: Internal Medicine

## 2014-05-01 ENCOUNTER — Encounter: Payer: Self-pay | Admitting: Internal Medicine

## 2014-05-01 ENCOUNTER — Ambulatory Visit (INDEPENDENT_AMBULATORY_CARE_PROVIDER_SITE_OTHER): Payer: No Typology Code available for payment source | Admitting: Internal Medicine

## 2014-05-01 VITALS — BP 148/93 | HR 82 | Temp 97.2°F | Ht 62.0 in | Wt 137.1 lb

## 2014-05-01 DIAGNOSIS — E1129 Type 2 diabetes mellitus with other diabetic kidney complication: Secondary | ICD-10-CM

## 2014-05-01 DIAGNOSIS — N058 Unspecified nephritic syndrome with other morphologic changes: Secondary | ICD-10-CM

## 2014-05-01 DIAGNOSIS — E1139 Type 2 diabetes mellitus with other diabetic ophthalmic complication: Secondary | ICD-10-CM

## 2014-05-01 DIAGNOSIS — E1142 Type 2 diabetes mellitus with diabetic polyneuropathy: Secondary | ICD-10-CM

## 2014-05-01 DIAGNOSIS — I10 Essential (primary) hypertension: Secondary | ICD-10-CM

## 2014-05-01 DIAGNOSIS — E1149 Type 2 diabetes mellitus with other diabetic neurological complication: Secondary | ICD-10-CM

## 2014-05-01 DIAGNOSIS — E1140 Type 2 diabetes mellitus with diabetic neuropathy, unspecified: Secondary | ICD-10-CM

## 2014-05-01 DIAGNOSIS — E11319 Type 2 diabetes mellitus with unspecified diabetic retinopathy without macular edema: Secondary | ICD-10-CM

## 2014-05-01 DIAGNOSIS — E785 Hyperlipidemia, unspecified: Secondary | ICD-10-CM

## 2014-05-01 DIAGNOSIS — E1121 Type 2 diabetes mellitus with diabetic nephropathy: Secondary | ICD-10-CM

## 2014-05-01 DIAGNOSIS — E1165 Type 2 diabetes mellitus with hyperglycemia: Principal | ICD-10-CM

## 2014-05-01 LAB — GLUCOSE, CAPILLARY: Glucose-Capillary: 135 mg/dL — ABNORMAL HIGH (ref 70–99)

## 2014-05-01 MED ORDER — PRAVASTATIN SODIUM 20 MG PO TABS: 20.0000 mg | ORAL_TABLET | Freq: Every day | ORAL | Status: DC

## 2014-05-01 MED ORDER — INSULIN DETEMIR 100 UNIT/ML FLEXPEN: 10.0000 [IU] | PEN_INJECTOR | Freq: Every day | SUBCUTANEOUS | Status: DC

## 2014-05-01 MED ORDER — LISINOPRIL 40 MG PO TABS: 40.0000 mg | ORAL_TABLET | Freq: Every day | ORAL | Status: DC

## 2014-05-01 NOTE — Progress Notes (Signed)
Patient ID: Amanda [name], female   DOB: November 15, 1951, 63 y.o.   MRN: [id number]  Subjective:   Patient ID: Amanda [name] female   DOB: [birth year] 63 y.o.   MRN: [id number]  HPI: Amanda [name] is a 63 y.o. F w/ PMHx of DM type II (w/ neuropathy), HTN, and dyslipidemia, presents to the clinic today for follow up.  Patient was last seen in the clinic on 03/18/14 for DM follow up. At that time, it was suspected that the patient has an autoimmune component to her DM, however, both pancreatic islet cell Ab and glutamic acid decarboxylase Ab were negative. Fasting serum insulin was low normal at 6 (normal range 3-28). Most recent HbA1c was 9.1 on 02/19/14. She was recently placed on Levemir 6 units in the AM + Novolog 2 units w/ dinner as it was found her CBG's were highest in the evening. However, Ms. Toto had been taking 4 units Levemir w/ 2 units Novolog 1-2 times daily w/ a snack. She found her blood sugars were ~200 at times w/ this dose of Levemir, so she increased the dose herself to 10 units and she claims her sugars have been very well controlled on this regimen, w/out any low blood sugars or symptoms of hypoglycemia. She claims she has been checking her blood sugars 2-3 times daily and ranges from 90-160.  Additionally, BP had been high in the recent past and patient was started on HCTZ 25 mg po qd. Today, she presents w/ BP of 148/93 and claims she has not been taking her Lisinopril 40 mg for the past 2-3 days.  She denies any recent headaches, fever, chills, nausea, vomiting, diarrhea, SOB, chest pain, dizziness, lightheadedness, or palpitations.  She does claim to have infrequent neuropathic pain in her hands and feet, which she has taken Elavil and Neurontin before in the past, but made her feel "out of it".   Past Medical History  Diagnosis Date  . Diabetes mellitus   . Hypotension   . Influenza A (H1N1) 01/03/2013  . DKA (diabetic ketoacidoses) 01/02/2013  . Anemia   . SYNCOPE 05/10/2010    Qualifier: Diagnosis of  By: Marilynne Halsted, RN, BSN, Jacquelyn    . Hypertension   . Hyperlipidemia    Current Outpatient Prescriptions  Medication Sig Dispense Refill  . acetaminophen (TYLENOL) 325 MG tablet Take 2 tablets (650 mg total) by mouth every 6 (six) hours as needed (or Fever >/= 101).  60 tablet  0  . aspirin 81 MG tablet Take 81 mg by mouth daily.      . hydrochlorothiazide (HYDRODIURIL) 25 MG tablet Take 1 tablet (25 mg total) by mouth daily.  30 tablet  5  . ibuprofen (ADVIL,MOTRIN) 200 MG tablet Take 400 mg by mouth every 6 (six) hours as needed.      . insulin aspart (NOVOLOG) 100 UNIT/ML FlexPen Inject 2 Units into the skin daily before supper.  15 mL  11  . Insulin Detemir (LEVEMIR) 100 UNIT/ML Pen Inject 6 Units into the skin daily.  15 mL  5  . lisinopril (PRINIVIL,ZESTRIL) 40 MG tablet Take 1 tablet (40 mg total) by mouth daily.  30 tablet  5  . metFORMIN (GLUCOPHAGE) 500 MG tablet Take 2 tablets (1,000 mg total) by mouth 2 (two) times daily with a meal.  120 tablet  5  . mometasone (NASONEX) 50 MCG/ACT nasal spray Place 2 sprays into the nose daily.  17 g  11  . Multiple Vitamins-Minerals (WOMENS  50+ MULTI VITAMIN/MIN) TABS Take 1 tablet by mouth daily.  30 tablet    . omeprazole (PRILOSEC) 40 MG capsule Take 1 capsule (40 mg total) by mouth daily.  30 capsule  1  . ondansetron (ZOFRAN) 4 MG tablet Take 1 tablet (4 mg total) by mouth every 6 (six) hours.  12 tablet  0  . polyethylene glycol (MIRALAX) packet Take 17 g by mouth daily.  14 each  0  . pravastatin (PRAVACHOL) 20 MG tablet Take 1 tablet (20 mg total) by mouth daily.  30 tablet  2   No current facility-administered medications for this visit.   Family History  Problem Relation Age of Onset  . Diabetes Mother   . Hypertension Mother   . Aneurysm Father   . Diabetes Sister   . Hypertension Sister   . Kidney disease Sister     One Kidney transplant, one sister on HD   History   Social History  . Marital  Status: Married    Spouse Name: N/A    Number of Children: N/A  . Years of Education: N/A   Social History Main Topics  . Smoking status: Never Smoker   . Smokeless tobacco: Never Used  . Alcohol Use: No  . Drug Use: No  . Sexual Activity: Not on file   Other Topics Concern  . Not on file   Social History Narrative  . No narrative on file   Review of Systems: General: Denies fever, chills, diaphoresis, appetite change and fatigue.    Respiratory: Denies SOB, DOE, cough, chest tightness, and wheezing.   Cardiovascular: Denies chest pain, palpitations and leg swelling.  Gastrointestinal: Denies nausea, vomiting, abdominal pain, diarrhea, constipation, blood in stool and abdominal distention.  Endocrine: Denies hot or cold intolerance, sweats, polyuria, polydipsia. Musculoskeletal: Positive for mild pain in hands and feet. Denies myalgias, back pain, joint swelling, arthralgias and gait problem.  Skin: Denies pallor, rash and wounds.  Neurological: Denies seizures, dizziness, lightheadedness, syncope, weakness, numbness and headaches.   Psychiatric/Behavioral: Denies mood changes, confusion, nervousness, sleep disturbance and agitation.   Objective:  Physical Exam: Filed Vitals:   05/01/14 0818  BP: 148/93  Pulse: 82  Temp: 97.2 F (36.2 C)  TempSrc: Oral  Height: 5\' 2"  (1.575 m)  Weight: 137 lb 1.6 oz (62.188 kg)  SpO2: 100%   General: Vital signs reviewed.  Patient is a well-developed and well-nourished, in no acute distress and cooperative with exam. Alert and oriented x3.  Head: Normocephalic and atraumatic. Eyes: PERRL, EOMI, conjunctivae normal, No scleral icterus.  Neck: Supple, trachea midline, normal ROM, No JVD, masses, thyromegaly, or carotid bruit present.  Cardiovascular: RRR, S1 normal, S2 normal, no murmurs, gallops, or rubs. Pulmonary/Chest: Normal respiratory effort, CTAB, no wheezes, rales, or rhonchi. Abdominal: Soft. Non-tender, non-distended, bowel  sounds are normal, no masses, organomegaly, or guarding present.  Extremities: No swelling or edema,  pulses symmetric and intact bilaterally. No cyanosis or clubbing. Neurological: A&O x3, Strength is normal and symmetric bilaterally, cranial nerve II-XII are grossly intact, no focal motor deficit, sensory intact to light touch bilaterally.  Skin: Warm, dry and intact. No rashes or erythema. Psychiatric: Normal mood and affect. speech and behavior is normal. Cognition and memory are normal.   Assessment & Plan:   Please see problem-based assessment and plan.

## 2014-05-01 NOTE — Assessment & Plan Note (Signed)
Patient has had neuropathy in feet and hands for some time and has tried both Elavil + Neurontin in the past and claims these medications make her feel very strange and she stopped taking them. We discussed other options such as pregabalin, however, this medication is very expensive and given patient's financial status, would not be able to afford.  -For now, patient is to try Capsacin Cream. If ineffective, can try nortryptyline starting at her next visit.

## 2014-05-01 NOTE — Assessment & Plan Note (Addendum)
Lab Results  Component Value Date   HGBA1C 9.1 02/19/2014   HGBA1C 11.1 01/03/2014   HGBA1C 5.9 09/09/2013     Assessment: Diabetes control: fair control Progress toward A1C goal:  unchanged Comments: Patient w/ extensive h/o of hypoglycemia in the past, recently had significant changes made to her insulin regimen. Some question of autoimmune component to DM, however both Islet Cell and GAD Ab were undetectable. During last visit, patient was started on Levemir 6 units in the AM + Novolog 2 units before dinner. However, Amanda [name] had been using 4 units of Levemir and 2 units Novolog 1-2 times daily (and inconsistently) w/ a snack. Her sugars at this time were slightly high (~200), so she increased her Levemir to 10 units in the AM. Since that time, her CBG's have been well controlled, still highest in the PM. She also reports no low blood sugars or symptoms of hypoglycemia. We discussed her new insulin regimen (see below) and patient understands.   Plan: Medications:  Levemir 10 units in AM + Novolog 2 units w/ dinner Home glucose monitoring: Frequency: 3 times a day Timing: before breakfast;before lunch;before dinner Instruction/counseling given: reminded to bring blood glucose meter & log to each visit Educational resources provided: brochure Self management tools provided: copy of home glucose meter download Other plans: Checking Urine Microalbumin/Cr today. Had previously been very high at 1157 mg/g on 02/14/13. Patient to return in ~1 month for continued DM check and repeat HbA1c. Will make further adjustments to insulin regimen at that time.

## 2014-05-01 NOTE — Patient Instructions (Signed)
General Instructions:  1. Please schedule a follow up appointment for 1 month.  2. Please take all medications as prescribed. PLEASE TAKE LEVEMIR 10 UNITS IN AM (long acting insulin), AND NOVOLOG 2 UNITS BEFORE DINNER (short acting insulin).  Please continue to check blood sugars 3 times daily. Bring your meter next visit!  3. If you have worsening of your symptoms or new symptoms arise, please call the clinic [phone]), or go to the ER immediately if symptoms are severe.  You have done a great job in taking all your medications. I appreciate it very much. Please continue doing that.   Thank you for bringing your medicines today. This helps [place] keep you safe from mistakes.   Progress Toward Treatment Goals:  Treatment Goal 05/01/2014  Hemoglobin A1C unchanged  Blood pressure at goal  Prevent falls -    Self Care Goals & Plans:  Self Care Goal 05/01/2014  Manage my medications take my medicines as prescribed; bring my medications to every visit  Monitor my health keep track of my blood glucose  Eat healthy foods drink diet soda or water instead of juice or soda; eat more vegetables; eat foods that are low in salt; eat baked foods instead of fried foods; eat fruit for snacks and desserts  Be physically active find an activity I enjoy; take a walk every day  Prevent falls -  Meeting treatment goals maintain the current self-care plan    Home Blood Glucose Monitoring 05/01/2014  Check my blood sugar 3 times a day  When to check my blood sugar before breakfast; before lunch; before dinner     Care Management & Community Referrals:  Referral 05/01/2014  Referrals made for care management support none needed  Referrals made to community resources none

## 2014-05-01 NOTE — Assessment & Plan Note (Signed)
BP Readings from Last 3 Encounters:  05/01/14 148/93  04/05/14 94/57  03/18/14 126/80    Lab Results  Component Value Date   NA 139 04/05/2014   K 3.8 04/05/2014   CREATININE 1.10 04/05/2014    Assessment: Blood pressure control: controlled Progress toward BP goal:  at goal Comments: Patient currently taking HCTZ 25 mg po qd + Lisinopril 40 mg po qd, however, patient has been without Lisinopril for 2-3 days. BP still controlled at this time.   Plan: Medications:  continue current medications Educational resources provided: brochure Self management tools provided:   Other plans: BP recheck in 1 month. If BP is LOW, will decrease HCTZ to 12.5 daily.

## 2014-05-01 NOTE — Assessment & Plan Note (Signed)
Patient has h/o very high Urine Microalbumin/Cr at 1157 mg/g on 02/14/13. Currently on Lisinopril 40 mg po qd. -Recheck Urine Microalb/Cr today.

## 2014-05-01 NOTE — Assessment & Plan Note (Signed)
Refilled Pravastatin

## 2014-05-01 NOTE — Assessment & Plan Note (Signed)
Patient continues to follow w/ Dr. Zadie Rhine, recently had right cataract surgery.

## 2014-05-02 ENCOUNTER — Ambulatory Visit: Payer: Self-pay | Admitting: Internal Medicine

## 2014-05-02 LAB — MICROALBUMIN / CREATININE URINE RATIO
Creatinine, Urine: 80.3 mg/dL
Microalb Creat Ratio: 515.1 mg/g — ABNORMAL HIGH (ref 0.0–30.0)
Microalb, Ur: 41.36 mg/dL — ABNORMAL HIGH (ref 0.00–1.89)

## 2014-05-06 NOTE — Progress Notes (Signed)
Case discussed with Dr. Jones at the time of the visit.  We reviewed the resident's history and exam and pertinent patient test results.  I agree with the assessment, diagnosis, and plan of care documented in the resident's note. 

## 2014-05-27 ENCOUNTER — Ambulatory Visit (INDEPENDENT_AMBULATORY_CARE_PROVIDER_SITE_OTHER): Payer: No Typology Code available for payment source | Admitting: Gastroenterology

## 2014-05-27 ENCOUNTER — Encounter: Payer: Self-pay | Admitting: Gastroenterology

## 2014-05-27 VITALS — BP 112/70 | HR 77 | Ht 62.0 in | Wt 132.0 lb

## 2014-05-27 DIAGNOSIS — R197 Diarrhea, unspecified: Secondary | ICD-10-CM

## 2014-05-27 MED ORDER — MOVIPREP 100 G PO SOLR: 1.0000 | Freq: Once | ORAL | Status: DC

## 2014-05-27 NOTE — Patient Instructions (Addendum)
#  1 side effect of glucophage is diarrhea, this may contribute to your loose stools.  Prinivil and pravastatin can both cause loose stools as well, but less often. You will be set up for a colonoscopy for diarrhea. Please start taking citrucel (orange flavored) powder fiber supplement.  This may cause some bloating at first but that usually goes away. Begin with a small spoonful and work your way up to a large, heaping spoonful daily over a week.

## 2014-05-27 NOTE — Progress Notes (Signed)
HPI: This is a  very pleasant 63 year old woman whom I am meeting for the first time today.  Has had loose stools for about a year.  Non bloody. She will go 3-4 times per day.  Followed by NO BM for 3-4 days.   Can be watery. No significant abd pains. Her weight has been overall stable.   No family history of  colon cancer, colon polyps.   Labs 03/2014: cbc normal except Hb 11.5, BMET normal except BUN 27  Believes she had colonoscopy 11 years ago; she doesn't recall who did it, when exactly it was done, believes one polyps was removed, where it was done (probably in [place]).   Has been on glucophage for about 5 years. Dose was increased about 2 months ago and also 6 months ago.  Will take imodium periodically, only on loose stool days.  Previously she would have 1-2 formed BMs daily.  Review of systems: Pertinent positive and negative review of systems were noted in the above HPI section. Complete review of systems was performed and was otherwise normal.    Past Medical History  Diagnosis Date  . Diabetes mellitus   . Hypotension   . Influenza A (H1N1) 01/03/2013  . DKA (diabetic ketoacidoses) 01/02/2013  . Anemia   . SYNCOPE 05/10/2010    Qualifier: Diagnosis of  By: Marilynne Halsted, RN, BSN, Jacquelyn    . Hypertension   . Hyperlipidemia     Past Surgical History  Procedure Laterality Date  . Rotator cuff repair    . Abdominal hysterectomy      10 years ago   . Shoulder surgery      Rotator cuff tear     Current Outpatient Prescriptions  Medication Sig Dispense Refill  . acetaminophen (TYLENOL) 325 MG tablet Take 2 tablets (650 mg total) by mouth every 6 (six) hours as needed (or Fever >/= 101).  60 tablet  0  . aspirin 81 MG tablet Take 81 mg by mouth daily.      . hydrochlorothiazide (HYDRODIURIL) 25 MG tablet Take 1 tablet (25 mg total) by mouth daily.  30 tablet  5  . ibuprofen (ADVIL,MOTRIN) 200 MG tablet Take 400 mg by mouth every 6 (six) hours as needed.      .  insulin aspart (NOVOLOG) 100 UNIT/ML FlexPen Inject 2 Units into the skin daily before supper.  15 mL  11  . Insulin Detemir (LEVEMIR) 100 UNIT/ML Pen Inject 10 Units into the skin daily.      [name] lisinopril (PRINIVIL,ZESTRIL) 40 MG tablet Take 1 tablet (40 mg total) by mouth daily.  30 tablet  5  . metFORMIN (GLUCOPHAGE) 500 MG tablet Take 2 tablets (1,000 mg total) by mouth 2 (two) times daily with a meal.  120 tablet  5  . mometasone (NASONEX) 50 MCG/ACT nasal spray Place 2 sprays into the nose daily.  17 g  11  . Multiple Vitamins-Minerals (WOMENS 50+ MULTI VITAMIN/MIN) TABS Take 1 tablet by mouth daily.  30 tablet    . omeprazole (PRILOSEC) 40 MG capsule Take 1 capsule (40 mg total) by mouth daily.  30 capsule  1  . ondansetron (ZOFRAN) 4 MG tablet Take 1 tablet (4 mg total) by mouth every 6 (six) hours.  12 tablet  0  . polyethylene glycol (MIRALAX) packet Take 17 g by mouth daily.  14 each  0  . pravastatin (PRAVACHOL) 20 MG tablet Take 1 tablet (20 mg total) by mouth daily.  30 tablet  5   No current facility-administered medications for this visit.    Allergies as of 05/27/2014 - Review Complete 05/27/2014  Allergen Reaction Noted  . Gabapentin  01/17/2014    Family History  Problem Relation Age of Onset  . Diabetes Mother   . Hypertension Mother   . Aneurysm Father   . Diabetes Sister   . Hypertension Sister   . Kidney disease Sister     One Kidney transplant, one sister on HD    History   Social History  . Marital Status: Married    Spouse Name: N/A    Number of Children: N/A  . Years of Education: N/A   Occupational History  . Not on file.   Social History Main Topics  . Smoking status: Never Smoker   . Smokeless tobacco: Never Used  . Alcohol Use: No  . Drug Use: No  . Sexual Activity: Not on file   Other Topics Concern  . Not on file   Social History Narrative  . No narrative on file       Physical Exam: BP 112/70  Pulse 77  Ht 5\' 2"  (1.575 m)   Wt 132 lb (59.875 kg)  BMI 24.14 kg/m2 Constitutional: generally well-appearing Psychiatric: alert and oriented x3 Eyes: extraocular movements intact Mouth: oral pharynx moist, no lesions Neck: supple no lymphadenopathy Cardiovascular: heart regular rate and rhythm Lungs: clear to auscultation bilaterally Abdomen: soft, nontender, nondistended, no obvious ascites, no peritoneal signs, normal bowel sounds Extremities: no lower extremity edema bilaterally Skin: no lesions on visible extremities    Assessment and plan: 63 y.o. female with  chronic loose stools intermittently  Glucophage is well known to cause loose stools and perhaps is contributing here somewhat. It is interesting how her loose stools started over the past year and during the past year her Glucophage dose has been increased twice. She is going to try fiber supplements once daily to see if that can even out her bowel pattern a day. I am not overly convinced that this will help and in fact for intermittent loose stools such as hers it could make matters worse. She agreed to a trial for at least a week however. She has not had colonoscopy in 10 or 11 years and we will proceed with that at her soonest convenience as well.

## 2014-06-02 ENCOUNTER — Encounter: Payer: Self-pay | Admitting: Internal Medicine

## 2014-06-02 ENCOUNTER — Ambulatory Visit (INDEPENDENT_AMBULATORY_CARE_PROVIDER_SITE_OTHER): Payer: No Typology Code available for payment source | Admitting: Internal Medicine

## 2014-06-02 VITALS — BP 114/72 | HR 77 | Temp 99.8°F | Ht 62.0 in | Wt 135.2 lb

## 2014-06-02 DIAGNOSIS — E1140 Type 2 diabetes mellitus with diabetic neuropathy, unspecified: Secondary | ICD-10-CM

## 2014-06-02 DIAGNOSIS — E1142 Type 2 diabetes mellitus with diabetic polyneuropathy: Secondary | ICD-10-CM

## 2014-06-02 DIAGNOSIS — E1139 Type 2 diabetes mellitus with other diabetic ophthalmic complication: Secondary | ICD-10-CM

## 2014-06-02 DIAGNOSIS — I10 Essential (primary) hypertension: Secondary | ICD-10-CM

## 2014-06-02 DIAGNOSIS — E1165 Type 2 diabetes mellitus with hyperglycemia: Principal | ICD-10-CM

## 2014-06-02 DIAGNOSIS — R197 Diarrhea, unspecified: Secondary | ICD-10-CM | POA: Insufficient documentation

## 2014-06-02 DIAGNOSIS — E1149 Type 2 diabetes mellitus with other diabetic neurological complication: Secondary | ICD-10-CM

## 2014-06-02 DIAGNOSIS — Z0000 Encounter for general adult medical examination without abnormal findings: Secondary | ICD-10-CM

## 2014-06-02 LAB — GLUCOSE, CAPILLARY: Glucose-Capillary: 96 mg/dL (ref 70–99)

## 2014-06-02 LAB — POCT GLYCOSYLATED HEMOGLOBIN (HGB A1C): Hemoglobin A1C: 7

## 2014-06-02 MED ORDER — SIMETHICONE 80 MG PO CHEW: 80.0000 mg | CHEWABLE_TABLET | Freq: Four times a day (QID) | ORAL | Status: DC | PRN

## 2014-06-02 MED ORDER — METFORMIN HCL ER 500 MG PO TB24: 1000.0000 mg | ORAL_TABLET | Freq: Every day | ORAL | Status: DC

## 2014-06-02 MED ORDER — NORTRIPTYLINE HCL 10 MG PO CAPS: 10.0000 mg | ORAL_CAPSULE | Freq: Every day | ORAL | Status: DC

## 2014-06-02 NOTE — Assessment & Plan Note (Signed)
BP Readings from Last 3 Encounters:  06/02/14 114/72  05/27/14 112/70  05/01/14 148/93    Lab Results  Component Value Date   NA 139 04/05/2014   K 3.8 04/05/2014   CREATININE 1.10 04/05/2014    Assessment: Blood pressure control: controlled Progress toward BP goal:  at goal Comments: None  Plan: Medications:  continue current medications; HCTZ 25 mg qd + Lisinopril 40 mg qd. Educational resources provided:   Self management tools provided:   Other plans: RTC in 4 weeks.

## 2014-06-02 NOTE — Assessment & Plan Note (Signed)
Lab Results  Component Value Date   HGBA1C 7.0 06/02/2014   HGBA1C 9.1 02/19/2014   HGBA1C 11.1 01/03/2014     Assessment: Diabetes control: good control (HgbA1C at goal) Progress toward A1C goal:  improved Comments: Significant improvement in HbA1c, now 7.0 decreased from 9.1. Has been taking Metformin 1000 mg bid, however patient has had diarrhea chronically for quite some time, possibly 2/2 medication. Also using Levemir 10 units in the AM + Novolog 2 units w/ dinner.   Plan: Medications:  continue current medications, Change Metformin to Glucophage XR 1000 mg daily (shown to cause less diarrhea). Can titrate up if CBG's are elevated and patient tolerates. Continue Insulin as above.  Home glucose monitoring: Frequency: 2 times a day Timing: before breakfast;after dinner Instruction/counseling given: reminded to bring blood glucose meter & log to each visit Educational resources provided:   Self management tools provided: copy of home glucose meter download Other plans: Change to XR metformin as described above.  -Patient to continue to check CBG's regularly and return to clinic in 4 weeks for follow up after change in medication.

## 2014-06-02 NOTE — Progress Notes (Signed)
Subjective:   Patient ID: Amanda [name] female   DOB: 1951-11-05 63 y.o.   MRN: [id number]  HPI: Amanda [name] is a 64 y.o. F w/ PMHx of DM type II (w/ neuropathy), HTN, and dyslipidemia, presents to the clinic today for follow up.  Patient was last seen in the clinic on 05/01/14 for management of her DM, at which time her Levemir was changed to 10 units in the AM and instructed to use Novolog 2 units in the PM w/ dinner. The patient claims her blood sugar has been very well controlled recently, none over 200, none below 70, w/ an average around 130. The patient checks her blood sugar 2 times daily, once before breakfast (usually around 100), and once after dinner (usually around 160-170). She does not report symptoms of dizziness, tremulousness, or diaphoresis. She was also referred to Gastroenterology at last visit for screening colonoscopy and saw Dr. Ardis Hughs on 05/27/14. She says she had been having issues w/ loose stools for about 1 year or so, but says it has become slightly worse recently (most likely associated w/ Metformin use). During her visit w/ Dr. Ardis Hughs, she was instructed to take fiber supplements in attempt to bulk her stools. She says it may have helped somewhat since she started using this, but she still has loose stools more than she would like.  Lastly, patient continues to complain of neuropathic pain in her feet bilaterally, likely related to her DM. She has tried Amitryptiline and Neurontin in the past and says these meds made her "feel funny". She claims this pain is the worst at night, mostly in the bottom of her feet.   Past Medical History  Diagnosis Date  . Diabetes mellitus   . Hypotension   . Influenza A (H1N1) 01/03/2013  . DKA (diabetic ketoacidoses) 01/02/2013  . Anemia   . SYNCOPE 05/10/2010    Qualifier: Diagnosis of  By: Marilynne Halsted, RN, BSN, Jacquelyn    . Hypertension   . Hyperlipidemia    Current Outpatient Prescriptions  Medication Sig Dispense Refill  .  acetaminophen (TYLENOL) 325 MG tablet Take 2 tablets (650 mg total) by mouth every 6 (six) hours as needed (or Fever >/= 101).  60 tablet  0  . aspirin 81 MG tablet Take 81 mg by mouth daily.      . brimonidine-timolol (COMBIGAN) 0.2-0.5 % ophthalmic solution Place 1 drop into both eyes every 12 (twelve) hours.      . hydrochlorothiazide (HYDRODIURIL) 25 MG tablet Take 1 tablet (25 mg total) by mouth daily.  30 tablet  5  . ibuprofen (ADVIL,MOTRIN) 200 MG tablet Take 400 mg by mouth every 6 (six) hours as needed.      . insulin aspart (NOVOLOG) 100 UNIT/ML FlexPen Inject 2 Units into the skin daily before supper.  15 mL  11  . Insulin Detemir (LEVEMIR) 100 UNIT/ML Pen Inject 10 Units into the skin daily.      [name] lisinopril (PRINIVIL,ZESTRIL) 40 MG tablet Take 1 tablet (40 mg total) by mouth daily.  30 tablet  5  . metFORMIN (GLUCOPHAGE) 500 MG tablet Take 2 tablets (1,000 mg total) by mouth 2 (two) times daily with a meal.  120 tablet  5  . mometasone (NASONEX) 50 MCG/ACT nasal spray Place 2 sprays into the nose daily.  17 g  11  . MOVIPREP 100 G SOLR Take 1 kit (200 g total) by mouth once.  1 kit  0  . Multiple Vitamins-Minerals (WOMENS 50+  MULTI VITAMIN/MIN) TABS Take 1 tablet by mouth daily.  30 tablet    . ofloxacin (OCUFLOX) 0.3 % ophthalmic solution 1 drop 4 (four) times daily.      [name] omeprazole (PRILOSEC) 40 MG capsule Take 1 capsule (40 mg total) by mouth daily.  30 capsule  1  . ondansetron (ZOFRAN) 4 MG tablet Take 1 tablet (4 mg total) by mouth every 6 (six) hours.  12 tablet  0  . polyethylene glycol (MIRALAX) packet Take 17 g by mouth daily.  14 each  0  . pravastatin (PRAVACHOL) 20 MG tablet Take 1 tablet (20 mg total) by mouth daily.  30 tablet  5   No current facility-administered medications for this visit.   Family History  Problem Relation Age of Onset  . Diabetes Mother   . Hypertension Mother   . Aneurysm Father   . Diabetes Sister   . Hypertension Sister   . Kidney  disease Sister     One Kidney transplant, one sister on HD   History   Social History  . Marital Status: Married    Spouse Name: N/A    Number of Children: N/A  . Years of Education: N/A   Social History Main Topics  . Smoking status: Never Smoker   . Smokeless tobacco: Never Used  . Alcohol Use: No  . Drug Use: No  . Sexual Activity: None   Other Topics Concern  . None   Social History Narrative  . None   Review of Systems: General: Denies fever, chills, diaphoresis, appetite change and fatigue.    Respiratory: Denies SOB, DOE, cough, chest tightness, and wheezing.   Cardiovascular: Denies chest pain, palpitations and leg swelling.  Gastrointestinal: Positive for diarrhea. Denies nausea, vomiting, abdominal pain, constipation, blood in stool and abdominal distention.  Endocrine: Denies hot or cold intolerance, sweats, polyuria, polydipsia. Musculoskeletal: Positive for foot pain bilaterally. Denies myalgias, back pain, joint swelling, arthralgias and gait problem.  Skin: Denies pallor, rash and wounds.  Neurological: Denies seizures, dizziness, lightheadedness, syncope, weakness, numbness and headaches.   Psychiatric/Behavioral: Denies mood changes, confusion, nervousness, sleep disturbance and agitation.   Objective:  Physical Exam: Filed Vitals:   06/02/14 1453  BP: 114/72  Pulse: 77  Temp: 99.8 F (37.7 C)  TempSrc: Oral  Height: '5\' 2"'  (1.575 m)  Weight: 135 lb 3.2 oz (61.326 kg)  SpO2: 100%   General: Vital signs reviewed.  Patient is a well-developed and well-nourished, in no acute distress and cooperative with exam. Alert and oriented x3.  Head: Normocephalic and atraumatic. Eyes: PERRL, EOMI, conjunctivae normal, No scleral icterus.  Neck: Supple, trachea midline, normal ROM, No JVD, masses, thyromegaly, or carotid bruit present.  Cardiovascular: RRR, S1 normal, S2 normal, no murmurs, gallops, or rubs. Pulmonary/Chest: Normal respiratory effort, CTAB, no  wheezes, rales, or rhonchi. Abdominal: Soft. Non-tender, non-distended, bowel sounds are normal, no masses, organomegaly, or guarding present.  Extremities: No swelling or edema,  pulses symmetric and intact bilaterally. No cyanosis or clubbing. Mild sensitivityTTP in feet bilaterally.  Neurological: A&O x3, Strength is normal and symmetric bilaterally, cranial nerve II-XII are grossly intact, no focal motor deficit, sensory intact to light touch bilaterally.  Skin: Warm, dry and intact. No rashes or erythema. Psychiatric: Normal mood and affect. Speech and behavior is normal. Cognition and memory are normal.   Assessment & Plan:   Please see problem-based assessment and plan.

## 2014-06-02 NOTE — Assessment & Plan Note (Signed)
Patient complains of having loose bowel movements (~4 daily) on most days, but then says she will go a couple days without having a bowel movement. She says this has been going on for about 1 year, but says that it may have become worse recently. Denies blood or mucus, no significant foul odor or color change. Does describe associated bloating and flatus. This very likely could be related to Metformin, titrated up to 1000 mg bid over the past several months. Seen by Dr. Ardis Hughs (GI) provided patient w/ fiber supplement to bulk stools, patient claims this has helped somewhat.  -Change Metformin to Metformin XR 1000 mg daily for now. Continue to follow w/ respect to symptoms and blood sugars. Reluctant to decrease the dose as DM has become very well controlled.  -Simethicone for gas

## 2014-06-02 NOTE — Assessment & Plan Note (Signed)
Patient continues to complain of neuropathic-type pain in her feet. Has tried Elavil and Neurontin in the past but says these meds made her feel weird.  -Will try Pamelor 10 mg po qhs for now

## 2014-06-02 NOTE — Patient Instructions (Signed)
General Instructions:  1. Please schedule a follow up appointment for 4-6 weeks.  2. Please take all medications as prescribed.   Start taking Metformin XR 1000 mg qd instead.   Start taking Nortriptyline 10 mg before bed for neuropathic foot pain  Take Simethicone for gas.   3. If you have worsening of your symptoms or new symptoms arise, please call the clinic [phone] or go to the ER immediately if symptoms are severe.  You have done a great job in taking all your medications. I appreciate it very much. Please continue doing that.  Thank you for bringing your medicines today. This helps [place] keep you safe from mistakes.   Progress Toward Treatment Goals:  Treatment Goal 06/02/2014  Hemoglobin A1C improved  Blood pressure at goal  Prevent falls -    Self Care Goals & Plans:  Self Care Goal 06/02/2014  Manage my medications take my medicines as prescribed; bring my medications to every visit; refill my medications on time  Monitor my health keep track of my blood glucose; bring my glucose meter and log to each visit  Eat healthy foods drink diet soda or water instead of juice or soda; eat more vegetables; eat fruit for snacks and desserts  Be physically active find an activity I enjoy; take a walk every day  Prevent falls -  Meeting treatment goals maintain the current self-care plan    Home Blood Glucose Monitoring 06/02/2014  Check my blood sugar 2 times a day  When to check my blood sugar before breakfast; after dinner     Care Management & Community Referrals:  Referral 06/02/2014  Referrals made for care management support none needed  Referrals made to community resources none

## 2014-06-02 NOTE — Assessment & Plan Note (Signed)
Scheduled for colonoscopy on 06/04/14 w/ Dr. Ardis Hughs.

## 2014-06-04 ENCOUNTER — Encounter: Payer: Self-pay | Admitting: Gastroenterology

## 2014-06-04 ENCOUNTER — Ambulatory Visit (AMBULATORY_SURGERY_CENTER): Payer: Self-pay | Admitting: Gastroenterology

## 2014-06-04 VITALS — BP 160/90 | HR 79 | Temp 97.4°F | Resp 14 | Ht 62.0 in | Wt 132.0 lb

## 2014-06-04 DIAGNOSIS — D126 Benign neoplasm of colon, unspecified: Secondary | ICD-10-CM

## 2014-06-04 DIAGNOSIS — K599 Functional intestinal disorder, unspecified: Secondary | ICD-10-CM

## 2014-06-04 DIAGNOSIS — R197 Diarrhea, unspecified: Secondary | ICD-10-CM

## 2014-06-04 DIAGNOSIS — K5730 Diverticulosis of large intestine without perforation or abscess without bleeding: Secondary | ICD-10-CM

## 2014-06-04 LAB — GLUCOSE, CAPILLARY: Glucose-Capillary: 86 mg/dL (ref 70–99)

## 2014-06-04 MED ORDER — DEXTROSE 5 % IV SOLN: INTRAVENOUS | Status: DC

## 2014-06-04 NOTE — Progress Notes (Signed)
Case discussed with Dr. Jones at the time of the visit.  We reviewed the resident's history and exam and pertinent patient test results.  I agree with the assessment, diagnosis, and plan of care documented in the resident's note. 

## 2014-06-04 NOTE — Progress Notes (Signed)
Procedure ends, to recovery, report given and VSS. 

## 2014-06-04 NOTE — Patient Instructions (Signed)
Discharge instructions given with verbal understanding. Handouts on polyps and diverticulosis. Resume previous medications. YOU HAD AN ENDOSCOPIC PROCEDURE TODAY AT THE [facility]: Refer to the procedure report that was given to you for any specific questions about what was found during the examination.  If the procedure report does not answer your questions, please call your gastroenterologist to clarify.  If you requested that your care partner not be given the details of your procedure findings, then the procedure report has been included in a sealed envelope for you to review at your convenience later.  YOU SHOULD EXPECT: Some feelings of bloating in the abdomen. Passage of more gas than usual.  Walking can help get rid of the air that was put into your GI tract during the procedure and reduce the bloating. If you had a lower endoscopy (such as a colonoscopy or flexible sigmoidoscopy) you may notice spotting of blood in your stool or on the toilet paper. If you underwent a bowel prep for your procedure, then you may not have a normal bowel movement for a few days.  DIET: Your first meal following the procedure should be a light meal and then it is ok to progress to your normal diet.  A half-sandwich or bowl of soup is an example of a good first meal.  Heavy or fried foods are harder to digest and may make you feel nauseous or bloated.  Likewise meals heavy in dairy and vegetables can cause extra gas to form and this can also increase the bloating.  Drink plenty of fluids but you should avoid alcoholic beverages for 24 hours.  ACTIVITY: Your care partner should take you home directly after the procedure.  You should plan to take it easy, moving slowly for the rest of the day.  You can resume normal activity the day after the procedure however you should NOT DRIVE or use heavy machinery for 24 hours (because of the sedation medicines used during the test).    SYMPTOMS TO REPORT  IMMEDIATELY: A gastroenterologist can be reached at any hour.  During normal business hours, 8:30 AM to 5:00 PM Monday through Friday, call [phone].  After hours and on weekends, please call the GI answering service at [phone] who will take a message and have the physician on call contact you.   Following lower endoscopy (colonoscopy or flexible sigmoidoscopy):  Excessive amounts of blood in the stool  Significant tenderness or worsening of abdominal pains  Swelling of the abdomen that is new, acute  Fever of 100F or higher  FOLLOW UP: If any biopsies were taken you will be contacted by phone or by letter within the next 1-3 weeks.  Call your gastroenterologist if you have not heard about the biopsies in 3 weeks.  Our staff will call the home number listed on your records the next business day following your procedure to check on you and address any questions or concerns that you may have at that time regarding the information given to you following your procedure. This is a courtesy call and so if there is no answer at the home number and we have not heard from you through the emergency physician on call, we will assume that you have returned to your regular daily activities without incident.  SIGNATURES/CONFIDENTIALITY: You and/or your care partner have signed paperwork which will be entered into your electronic medical record.  These signatures attest to the fact that that the information above on your After Visit Summary   has been reviewed and is understood.  Full responsibility of the confidentiality of this discharge information lies with you and/or your care-partner. 

## 2014-06-04 NOTE — Progress Notes (Signed)
Called to room to assist during endoscopic procedure.  Patient ID and intended procedure confirmed with present staff. Received instructions for my participation in the procedure from the performing physician.  

## 2014-06-04 NOTE — Op Note (Signed)
[facility]  [address] [place], [id number]   COLONOSCOPY PROCEDURE REPORT  PATIENT: Amanda [name], Amanda [name]  MR#: [id number] BIRTHDATE: 14-Jun-1951 , 62  yrs. old GENDER: Female ENDOSCOPIST: Milus Banister, MD REFERRED BY:  Luanne Bras, MD PROCEDURE DATE:  06/04/2014 PROCEDURE:   Colonoscopy with biopsy and Colonoscopy with snare polypectomy First Screening Colonoscopy - Avg.  risk and is 50 yrs.  old or older - No.  Prior Negative Screening - Now for repeat screening. 10 or more years since last screening  History of Adenoma - Now for follow-up colonoscopy & has been > or = to 3 yrs.  N/A  Polyps Removed Today? Yes. ASA CLASS:   Class II INDICATIONS:intermittent diarrhea. MEDICATIONS: MAC sedation, administered by CRNA and propofol (Diprivan) 200mg  IV  DESCRIPTION OF PROCEDURE:   After the risks benefits and alternatives of the procedure were thoroughly explained, informed consent was obtained.  A digital rectal exam revealed no abnormalities of the rectum.   The LB [id number]  endoscope was introduced through the anus and advanced to the terminal ileum which was intubated for a short distance. No adverse events experienced.   The quality of the prep was excellent.  The instrument was then slowly withdrawn as the colon was fully examined.  COLON FINDINGS: The terminal ileum was normal.  There were two polyps that were found, removed and sent to pathology.  These were both sessile, 2-91mm across, located in ascending segment, removed with cold snare.  There were multiple diverticulum throughout the left colon.  The examination was otherwise normal.  The colon was randomly biopsied.  Retroflexed views revealed no abnormalities. The time to cecum=4 minutes 23 seconds.  Withdrawal time=9 minutes 50 seconds.  The scope was withdrawn and the procedure completed. COMPLICATIONS: There were no complications.  ENDOSCOPIC IMPRESSION: The terminal ileum was normal.   There were two polyps that were found, removed and sent to pathology. There were multiple diverticulum throughout the left colon.  The examination was otherwise normal.  The colon was randomly biopsied to check for microscopic colitis.  RECOMMENDATIONS: If the polyp(s) removed today are proven to be adenomatous (pre-cancerous) polyps, you will need a repeat colonoscopy in 5 years.  Otherwise you should continue to follow colorectal cancer screening guidelines for "routine risk" patients with colonoscopy in 10 years.  You will receive a letter within 1-2 weeks with the results of your biopsy as well as final recommendations.  Please call my office if you have not received a letter after 3 weeks. Await random colon biopsy results. For now stay on daily fiber supplements (seems like this is helping)  eSigned:  Milus Banister, MD 06/04/2014 3:25 PM

## 2014-06-05 ENCOUNTER — Telehealth: Payer: Self-pay

## 2014-06-05 LAB — GLUCOSE, CAPILLARY: Glucose-Capillary: 156 mg/dL — ABNORMAL HIGH (ref 70–99)

## 2014-06-05 NOTE — Telephone Encounter (Signed)
  Follow up Call-  Call back number 06/04/2014  Post procedure Call Back phone  # [phone]  Permission to leave phone message Yes     Patient questions:  Do you have a fever, pain , or abdominal swelling? no Pain Score  0 *  Have you tolerated food without any problems? yes  Have you been able to return to your normal activities? yes  Do you have any questions about your discharge instructions: Diet   no Medications  no Follow up visit  no  Do you have questions or concerns about your Care? no  Actions: * If pain score is 4 or above: No action needed, pain <4.

## 2014-06-12 ENCOUNTER — Encounter: Payer: Self-pay | Admitting: Gastroenterology

## 2014-07-29 ENCOUNTER — Ambulatory Visit (INDEPENDENT_AMBULATORY_CARE_PROVIDER_SITE_OTHER): Payer: No Typology Code available for payment source | Admitting: Internal Medicine

## 2014-07-29 ENCOUNTER — Encounter: Payer: Self-pay | Admitting: Internal Medicine

## 2014-07-29 VITALS — BP 139/83 | HR 93 | Temp 98.6°F | Ht 62.0 in | Wt 131.4 lb

## 2014-07-29 DIAGNOSIS — J309 Allergic rhinitis, unspecified: Secondary | ICD-10-CM

## 2014-07-29 DIAGNOSIS — E1349 Other specified diabetes mellitus with other diabetic neurological complication: Secondary | ICD-10-CM

## 2014-07-29 DIAGNOSIS — J302 Other seasonal allergic rhinitis: Secondary | ICD-10-CM

## 2014-07-29 DIAGNOSIS — E1139 Type 2 diabetes mellitus with other diabetic ophthalmic complication: Secondary | ICD-10-CM

## 2014-07-29 DIAGNOSIS — E0843 Diabetes mellitus due to underlying condition with diabetic autonomic (poly)neuropathy: Secondary | ICD-10-CM

## 2014-07-29 DIAGNOSIS — G909 Disorder of the autonomic nervous system, unspecified: Secondary | ICD-10-CM

## 2014-07-29 DIAGNOSIS — R197 Diarrhea, unspecified: Secondary | ICD-10-CM

## 2014-07-29 DIAGNOSIS — R300 Dysuria: Secondary | ICD-10-CM | POA: Insufficient documentation

## 2014-07-29 DIAGNOSIS — E1165 Type 2 diabetes mellitus with hyperglycemia: Secondary | ICD-10-CM

## 2014-07-29 LAB — GLUCOSE, CAPILLARY: Glucose-Capillary: 118 mg/dL — ABNORMAL HIGH (ref 70–99)

## 2014-07-29 MED ORDER — FLUTICASONE PROPIONATE 50 MCG/ACT NA SUSP: 2.0000 | Freq: Every day | NASAL | Status: DC

## 2014-07-29 NOTE — Patient Instructions (Signed)
General Instructions:  1. Please schedule a follow up appointment for 6-8 weeks.  2. Please take all medications as prescribed.   Please start using Flonase for sinusitis and allergy symptoms as this may be associated with ear complaints.   3. If you have worsening of your symptoms or new symptoms arise, please call the clinic [phone]), or go to the ER immediately if symptoms are severe.  You have done a great job in taking all your medications. I appreciate it very much. Please continue doing that.   Please bring your medicines with you each time you come to clinic.  Medicines may include prescription medications, over-the-counter medications, herbal remedies, eye drops, vitamins, or other pills.   Progress Toward Treatment Goals:  Treatment Goal 06/02/2014  Hemoglobin A1C improved  Blood pressure at goal  Prevent falls -    Self Care Goals & Plans:  Self Care Goal 07/29/2014  Manage my medications take my medicines as prescribed; bring my medications to every visit; refill my medications on time  Monitor my health keep track of my blood glucose; bring my glucose meter and log to each visit  Eat healthy foods drink diet soda or water instead of juice or soda; eat more vegetables; eat foods that are low in salt; eat baked foods instead of fried foods; eat fruit for snacks and desserts  Be physically active -  Prevent falls -  Meeting treatment goals -    Home Blood Glucose Monitoring 06/02/2014  Check my blood sugar 2 times a day  When to check my blood sugar before breakfast; after dinner     Care Management & Community Referrals:  Referral 06/02/2014  Referrals made for care management support none needed  Referrals made to community resources none

## 2014-07-29 NOTE — Progress Notes (Signed)
Patient ID: Amanda [name], female   DOB: [birth year], 63 y.o.   MRN: [id number]  Subjective:   Patient ID: Amanda [name] female   DOB: [birth year] 63 y.o.   MRN: [id number]  HPI: Amanda [name] is a 63 y.o. F w/ PMHx of DM type II (w/ neuropathy), HTN, and dyslipidemia, presents to the clinic today for follow up.  Patient was last seen in the clinic on 06/02/61 for management of her DM. Today she comes to clinic after a fall yesterday. She claims she was walking in  Her house and tripped on a part of the carpet, causing her to fall forward onto her knees and right elbow, sustaining only minor injuries. She denies any pre-syncopal symptoms or loss of consciousness.  Amanda [name] also complains of dysuria for the past week or so. She also describes associated increased frequency, and mild urgency as well. Otherwise, no flank pain, change in urine color, hematuria, or odor.  During our last clinic visit, her Metformin was changed to the longer acting Glucophage XR b/c she complained of diarrhea in which she was having 5-6 bowel movements daily. She now says her diarrhea is significantly improved, only having 2-3 bowel movements daily, not as loose as before. She also described issues w/ peripheral neuropathy. She had previously tried amytriptyline and gabapentin, both of which she claimed made her feel badly. She was started on Nortriptyline 10 mg qhs during her last visit and says this has helped her quite a bit. No further complaints.   Past Medical History  Diagnosis Date  . Diabetes mellitus   . Hypotension   . Influenza A (H1N1) 01/03/2013  . DKA (diabetic ketoacidoses) 01/02/2013  . Anemia   . SYNCOPE 05/10/2010    Qualifier: Diagnosis of  By: Marilynne Halsted, RN, BSN, Jacquelyn    . Hypertension   . Hyperlipidemia   . Allergy     SEASONAL  . Arthritis   . Cataract     PRESENT IN LEFT/ CATARACTS REMOVED FROM RIGHT  . Neuromuscular disorder     NEUROPATHY   Current Outpatient Prescriptions   Medication Sig Dispense Refill  . acetaminophen (TYLENOL) 325 MG tablet Take 2 tablets (650 mg total) by mouth every 6 (six) hours as needed (or Fever >/= 101).  60 tablet  0  . aspirin 81 MG tablet Take 81 mg by mouth daily.      [name] BESIVANCE 0.6 % SUSP       . brimonidine-timolol (COMBIGAN) 0.2-0.5 % ophthalmic solution Place 1 drop into both eyes every 12 (twelve) hours.      . DUREZOL 0.05 % EMUL       . hydrochlorothiazide (HYDRODIURIL) 25 MG tablet Take 1 tablet (25 mg total) by mouth daily.  30 tablet  5  . ibuprofen (ADVIL,MOTRIN) 200 MG tablet Take 400 mg by mouth every 6 (six) hours as needed.      . insulin aspart (NOVOLOG) 100 UNIT/ML FlexPen Inject 2 Units into the skin daily before supper.  15 mL  11  . Insulin Detemir (LEVEMIR) 100 UNIT/ML Pen Inject 10 Units into the skin daily.      [name] lisinopril (PRINIVIL,ZESTRIL) 40 MG tablet Take 1 tablet (40 mg total) by mouth daily.  30 tablet  5  . metFORMIN (GLUCOPHAGE XR) 500 MG 24 hr tablet Take 2 tablets (1,000 mg total) by mouth daily with breakfast.  60 tablet  2  . mometasone (NASONEX) 50 MCG/ACT nasal spray Place 2 sprays into the  nose daily.  17 g  11  . Multiple Vitamins-Minerals (WOMENS 50+ MULTI VITAMIN/MIN) TABS Take 1 tablet by mouth daily.  30 tablet    . nortriptyline (PAMELOR) 10 MG capsule Take 1 capsule (10 mg total) by mouth at bedtime.  30 capsule  2  . ofloxacin (OCUFLOX) 0.3 % ophthalmic solution 1 drop 4 (four) times daily.      [name] omeprazole (PRILOSEC) 40 MG capsule Take 1 capsule (40 mg total) by mouth daily.  30 capsule  1  . ondansetron (ZOFRAN) 4 MG tablet Take 1 tablet (4 mg total) by mouth every 6 (six) hours.  12 tablet  0  . polyethylene glycol (MIRALAX) packet Take 17 g by mouth daily.  14 each  0  . pravastatin (PRAVACHOL) 20 MG tablet Take 1 tablet (20 mg total) by mouth daily.  30 tablet  5  . simethicone (GAS-X) 80 MG chewable tablet Chew 1 tablet (80 mg total) by mouth 4 (four) times daily as needed for  flatulence.  100 tablet  2   No current facility-administered medications for this visit.   Family History  Problem Relation Age of Onset  . Diabetes Mother   . Hypertension Mother   . Aneurysm Father   . Diabetes Sister   . Hypertension Sister   . Kidney disease Sister     One Kidney transplant, one sister on HD   History   Social History  . Marital Status: Married    Spouse Name: N/A    Number of Children: N/A  . Years of Education: N/A   Social History Main Topics  . Smoking status: Never Smoker   . Smokeless tobacco: Never Used  . Alcohol Use: No  . Drug Use: No  . Sexual Activity: None   Other Topics Concern  . None   Social History Narrative  . None   Review of Systems: General: Denies fever, chills, diaphoresis, appetite change and fatigue.  Respiratory: Denies SOB, DOE, cough, chest tightness, and wheezing.   Cardiovascular: Denies chest pain and palpitations.  Gastrointestinal: Denies nausea, vomiting, abdominal pain, diarrhea, constipation, blood in stool and abdominal distention.  Genitourinary: Positive for dysuria, increased frequency, and urgency. Denies hematuria, and flank pain. Endocrine: Denies hot or cold intolerance, polyuria, and polydipsia. Musculoskeletal: Positive for bilateral knee pain and right elbow pain. Denies myalgias, back pain, joint swelling, and gait problem.  Skin: Denies pallor, rash and wounds.  Neurological: Denies dizziness, seizures, syncope, weakness, lightheadedness, numbness and headaches.  Psychiatric/Behavioral: Denies mood changes, confusion, nervousness, sleep disturbance and agitation.   Objective:  Physical Exam: Filed Vitals:   07/29/14 1545  BP: 139/83  Pulse: 93  Temp: 98.6 F (37 C)  TempSrc: Oral  Height: 5\' 2"  (1.575 m)  Weight: 131 lb 6.4 oz (59.603 kg)  SpO2: 98%   General: Vital signs reviewed.  Patient is a well-developed and well-nourished, in no acute distress and cooperative with exam.  Head:  Normocephalic and atraumatic. Eyes: PERRL, EOMI, conjunctivae normal, No scleral icterus.  Neck: Supple, trachea midline, normal ROM, No JVD, masses, thyromegaly, or carotid bruit present.  Cardiovascular: RRR, S1 normal, S2 normal, no murmurs, gallops, or rubs. Pulmonary/Chest: Normal respiratory effort, CTAB, no wheezes, rales, or rhonchi. Abdominal: Soft. Non-tender, non-distended, bowel sounds are normal, no masses, organomegaly, or guarding present.  Extremities: No swelling or edema,  pulses symmetric and intact bilaterally. No cyanosis or clubbing. Mild bruising over her knees bilaterally, small healing scab on right elbow.  Neurological: A&O  x3, Strength is normal and symmetric bilaterally, cranial nerve II-XII are grossly intact, no focal motor deficit, sensory intact to light touch bilaterally.  Skin: Warm, dry and intact. No rashes or erythema. Psychiatric: Normal mood and affect. Speech and behavior is normal. Cognition and memory are normal.   Assessment & Plan:   Please see problem-based assessment and plan.

## 2014-07-30 LAB — URINALYSIS, ROUTINE W REFLEX MICROSCOPIC
Bilirubin Urine: NEGATIVE
Glucose, UA: NEGATIVE mg/dL
Ketones, ur: NEGATIVE mg/dL
Nitrite: NEGATIVE
Protein, ur: 30 mg/dL — AB
Specific Gravity, Urine: 1.011 (ref 1.005–1.030)
Urobilinogen, UA: 0.2 mg/dL (ref 0.0–1.0)
pH: 6 (ref 5.0–8.0)

## 2014-07-30 LAB — URINALYSIS, MICROSCOPIC ONLY
Bacteria, UA: NONE SEEN
Casts: NONE SEEN
Crystals: NONE SEEN
Squamous Epithelial / LPF: NONE SEEN

## 2014-07-30 MED ORDER — SULFAMETHOXAZOLE-TMP DS 800-160 MG PO TABS: 1.0000 | ORAL_TABLET | Freq: Two times a day (BID) | ORAL | Status: DC

## 2014-07-31 ENCOUNTER — Encounter: Payer: Self-pay | Admitting: Internal Medicine

## 2014-07-31 LAB — URINE CULTURE
Colony Count: NO GROWTH
Organism ID, Bacteria: NO GROWTH

## 2014-07-31 NOTE — Assessment & Plan Note (Signed)
Lab Results  Component Value Date   HGBA1C 7.0 06/02/2014   HGBA1C 9.1 02/19/2014   HGBA1C 11.1 01/03/2014     Assessment: Comments: Patient forgot her meter today, however, she claims her sugars continue to be well controlled on her same regimen. She currently uses Levemir 10 units daily, Novolog 2 units at bedtime, and Glucophage XR 1000 mg daily (changed from metformin 1000 mg bid at last visit). Diarrhea significantly improved with this change.   Plan: Medications:  continue current medications Home glucose monitoring: Frequency:   Timing:   Instruction/counseling given: reminded to bring blood glucose meter & log to each visit Educational resources provided: brochure Self management tools provided:   Other plans: RTC in 6-8 weeks for HbA1c recheck. If A1c has increased, may be able to titrate up long acting metformin since she is tolerating this better.

## 2014-07-31 NOTE — Assessment & Plan Note (Signed)
Patient claims to have had dysuria and increased frequency of urination for the past week. No flank pain, abdominal tenderness, or hematuria. UA showed small white cells.  -Given presence of symptoms, will treat w/ Bactrim-DS bid for 3 days. Discussed w/ patient who understood.  -Urine culture pending

## 2014-07-31 NOTE — Assessment & Plan Note (Signed)
Significantly improved since starting Nortryptiline 10 mg qhs. SHe claims she does not always take it daily, however, discussed that this may further improve her symptoms.  -Continue Pamelor

## 2014-07-31 NOTE — Assessment & Plan Note (Signed)
Improved since changing to long acting metformin. Also taking ?fiber supplement given to her by Dr. Ardis Hughs who she recently saw for a screening colonoscopy.  -Continue to monitor

## 2014-07-31 NOTE — Assessment & Plan Note (Signed)
Patient described some ear fullness and sinus pressure w/ rhinorrhea. No fever, chills, sore throat. Tenderness to palpation over the maxillary sinuses. Ears appear normal on exam. No erythema or fluid.  -Started Flonase for now; can add Claritin or Zyrtec for continued allergy symptoms

## 2014-07-31 NOTE — Progress Notes (Signed)
Case discussed with Dr. Jones at the time of the visit.  We reviewed the resident's history and exam and pertinent patient test results.  I agree with the assessment, diagnosis and plan of care documented in the resident's note. 

## 2014-08-18 ENCOUNTER — Other Ambulatory Visit: Payer: Self-pay | Admitting: *Deleted

## 2014-08-18 DIAGNOSIS — E1165 Type 2 diabetes mellitus with hyperglycemia: Principal | ICD-10-CM

## 2014-08-18 DIAGNOSIS — E1139 Type 2 diabetes mellitus with other diabetic ophthalmic complication: Secondary | ICD-10-CM

## 2014-08-18 NOTE — Telephone Encounter (Signed)
Pt calls and was confused about how much of each insulin to take, she stated she has continued to use lantus but will start the levemir tomorrow am, instructed her to call back for any problems. She wasn't sure of how much she had of each but did state she had both the levemir and lantus. You may reach her at [phone]

## 2014-08-19 MED ORDER — INSULIN ASPART 100 UNIT/ML FLEXPEN: 2.0000 [IU] | PEN_INJECTOR | Freq: Every day | SUBCUTANEOUS | Status: DC

## 2014-08-19 MED ORDER — INSULIN DETEMIR 100 UNIT/ML FLEXPEN: 10.0000 [IU] | PEN_INJECTOR | Freq: Every day | SUBCUTANEOUS | Status: DC

## 2014-09-13 ENCOUNTER — Other Ambulatory Visit: Payer: Self-pay | Admitting: Internal Medicine

## 2014-09-16 ENCOUNTER — Encounter: Payer: Self-pay | Admitting: Internal Medicine

## 2014-09-16 ENCOUNTER — Ambulatory Visit (INDEPENDENT_AMBULATORY_CARE_PROVIDER_SITE_OTHER): Payer: Self-pay | Admitting: Internal Medicine

## 2014-09-16 VITALS — BP 176/74 | HR 83 | Temp 98.4°F | Ht 62.0 in | Wt 128.7 lb

## 2014-09-16 DIAGNOSIS — E1149 Type 2 diabetes mellitus with other diabetic neurological complication: Secondary | ICD-10-CM

## 2014-09-16 DIAGNOSIS — I10 Essential (primary) hypertension: Secondary | ICD-10-CM

## 2014-09-16 DIAGNOSIS — E1139 Type 2 diabetes mellitus with other diabetic ophthalmic complication: Secondary | ICD-10-CM

## 2014-09-16 DIAGNOSIS — E1142 Type 2 diabetes mellitus with diabetic polyneuropathy: Secondary | ICD-10-CM

## 2014-09-16 DIAGNOSIS — K5901 Slow transit constipation: Secondary | ICD-10-CM | POA: Insufficient documentation

## 2014-09-16 DIAGNOSIS — E1165 Type 2 diabetes mellitus with hyperglycemia: Secondary | ICD-10-CM

## 2014-09-16 DIAGNOSIS — Z23 Encounter for immunization: Secondary | ICD-10-CM

## 2014-09-16 DIAGNOSIS — K5900 Constipation, unspecified: Secondary | ICD-10-CM | POA: Insufficient documentation

## 2014-09-16 DIAGNOSIS — Z0000 Encounter for general adult medical examination without abnormal findings: Secondary | ICD-10-CM

## 2014-09-16 LAB — GLUCOSE, CAPILLARY: Glucose-Capillary: 94 mg/dL (ref 70–99)

## 2014-09-16 LAB — POCT GLYCOSYLATED HEMOGLOBIN (HGB A1C): Hemoglobin A1C: 6.3

## 2014-09-16 MED ORDER — SENNA 8.6 MG PO TABS: 1.0000 | ORAL_TABLET | Freq: Every day | ORAL | Status: DC | PRN

## 2014-09-16 MED ORDER — DULOXETINE HCL 60 MG PO CPEP: 60.0000 mg | ORAL_CAPSULE | Freq: Every day | ORAL | Status: DC

## 2014-09-16 NOTE — Patient Instructions (Signed)
General Instructions:  1. Please schedule a follow up appointment for 6 weeks.  2. Please take all medications as prescribed.   Take Senna 1 tablet daily as NEEDED for hard stools/constipation  Start taking Cymbalta 60 mg daily for neuropathic pain.   3. If you have worsening of your symptoms or new symptoms arise, please call the clinic [phone]), or go to the ER immediately if symptoms are severe.  You have done a great job in taking all your medications. I appreciate it very much. Please continue doing that.   Please bring your medicines with you each time you come to clinic.  Medicines may include prescription medications, over-the-counter medications, herbal remedies, eye drops, vitamins, or other pills.   Progress Toward Treatment Goals:  Treatment Goal 09/16/2014  Hemoglobin A1C improved  Blood pressure unchanged  Prevent falls -    Self Care Goals & Plans:  Self Care Goal 09/16/2014  Manage my medications take my medicines as prescribed; bring my medications to every visit; refill my medications on time  Monitor my health keep track of my blood glucose; bring my glucose meter and log to each visit  Eat healthy foods drink diet soda or water instead of juice or soda; eat more vegetables; eat foods that are low in salt; eat baked foods instead of fried foods; eat fruit for snacks and desserts  Be physically active -  Prevent falls -  Meeting treatment goals maintain the current self-care plan    Home Blood Glucose Monitoring 09/16/2014  Check my blood sugar 2 times a day  When to check my blood sugar N/A     Care Management & Community Referrals:  Referral 09/16/2014  Referrals made for care management support none needed  Referrals made to community resources none

## 2014-09-16 NOTE — Progress Notes (Signed)
Subjective:   Patient ID: Amanda [name] female   DOB: 10-01-1951 63 y.o.   MRN: [id number]  HPI: Amanda [name] is a 63 y.o. female w/ PMHx of DM type II (w/ neuropathy), HTN, and dyslipidemia, presents to the clinic today for a follow up for DM. Patient was last seen on 07/29/14 for diabetes follow up, as well as problems w/ her allergies and dysuria. She was treated w/ Bactrim for 3 days. Urine culture showed no growth. Today she has no significant complaints. She no longer complains of dysuria and says her allergies are no longer an issue currently. She does claim to be having issues w/ her polyneuropathy lately. She has tried Neurontin, Amytriptiline, and recently Nortryptiline, all without improvement of her symptoms. She also states that these meds made her feel ill or "strange" as well. She states her pain is mostly located in her feet and lower legs, stocking-like distribution and accompanied by paraesthesias. She also claims that she has had similar issues in her fingers and hands recently.  The patient continues to be very complaint w/ her diabetic regimen, has been taking Levemir 10 units in the AM, Novolog 2 units w/ dinner, and Metformin XR 1000 mg in AM. Most recent HbA1c on 06/02/14 was 7.0, repeat 6.3 today.  The patient also presents w/ a small cut above her left eye, covered w/ a band-aid, says she slipped in the grocery store and hit her head on a shelf. Does not appear to be infected or actively bleeding.   Past Medical History  Diagnosis Date  . Diabetes mellitus   . Hypotension   . Influenza A (H1N1) 01/03/2013  . DKA (diabetic ketoacidoses) 01/02/2013  . Anemia   . SYNCOPE 05/10/2010    Qualifier: Diagnosis of  By: Marilynne Halsted, RN, BSN, Jacquelyn    . Hypertension   . Hyperlipidemia   . Allergy     SEASONAL  . Arthritis   . Cataract     PRESENT IN LEFT/ CATARACTS REMOVED FROM RIGHT  . Neuromuscular disorder     NEUROPATHY   Current Outpatient Prescriptions  Medication  Sig Dispense Refill  . acetaminophen (TYLENOL) 325 MG tablet Take 2 tablets (650 mg total) by mouth every 6 (six) hours as needed (or Fever >/= 101).  60 tablet  0  . aspirin 81 MG tablet Take 81 mg by mouth daily.      [name] BESIVANCE 0.6 % SUSP       . brimonidine-timolol (COMBIGAN) 0.2-0.5 % ophthalmic solution Place 1 drop into both eyes every 12 (twelve) hours.      . DUREZOL 0.05 % EMUL       . fluticasone (FLONASE) 50 MCG/ACT nasal spray Place 2 sprays into both nostrils daily.  16 g  2  . hydrochlorothiazide (HYDRODIURIL) 25 MG tablet TAKE ONE TABLET BY MOUTH ONCE DAILY  30 tablet  5  . ibuprofen (ADVIL,MOTRIN) 200 MG tablet Take 400 mg by mouth every 6 (six) hours as needed.      . insulin aspart (NOVOLOG) 100 UNIT/ML FlexPen Inject 2 Units into the skin daily before supper.  45 mL  4  . Insulin Detemir (LEVEMIR) 100 UNIT/ML Pen Inject 10 Units into the skin daily.  45 mL  4  . lisinopril (PRINIVIL,ZESTRIL) 40 MG tablet Take 1 tablet (40 mg total) by mouth daily.  30 tablet  5  . metFORMIN (GLUCOPHAGE-XR) 500 MG 24 hr tablet TAKE TWO TABLETS BY MOUTH ONCE DAILY WITH  BREAKFAST  60 tablet  5  . Multiple Vitamins-Minerals (WOMENS 50+ MULTI VITAMIN/MIN) TABS Take 1 tablet by mouth daily.  30 tablet    . nortriptyline (PAMELOR) 10 MG capsule Take 1 capsule (10 mg total) by mouth at bedtime.  30 capsule  2  . ofloxacin (OCUFLOX) 0.3 % ophthalmic solution 1 drop 4 (four) times daily.      [name] omeprazole (PRILOSEC) 40 MG capsule Take 1 capsule (40 mg total) by mouth daily.  30 capsule  1  . ondansetron (ZOFRAN) 4 MG tablet Take 1 tablet (4 mg total) by mouth every 6 (six) hours.  12 tablet  0  . pravastatin (PRAVACHOL) 20 MG tablet Take 1 tablet (20 mg total) by mouth daily.  30 tablet  5  . simethicone (GAS-X) 80 MG chewable tablet Chew 1 tablet (80 mg total) by mouth 4 (four) times daily as needed for flatulence.  100 tablet  2  . sulfamethoxazole-trimethoprim (BACTRIM DS) 800-160 MG per tablet  Take 1 tablet by mouth 2 (two) times daily.  6 tablet  0   No current facility-administered medications for this visit.    Review of Systems: General: Denies fever, chills, diaphoresis, appetite change and fatigue.  Respiratory: Denies SOB, DOE, cough, and wheezing.   Cardiovascular: Denies chest pain and palpitations.  Gastrointestinal: Denies nausea, vomiting, abdominal pain, and diarrhea.  Genitourinary: Denies dysuria, increased frequency, and flank pain. Endocrine: Denies hot or cold intolerance, polyuria, and polydipsia. Musculoskeletal: Positive for leg pain. Denies myalgias, back pain, joint swelling, and gait problem.  Skin: Denies pallor, rash and wounds.  Neurological: Denies dizziness, seizures, syncope, weakness, lightheadedness, numbness and headaches.  Psychiatric/Behavioral: Denies mood changes, and sleep disturbances.  Objective:   Physical Exam: Filed Vitals:   09/16/14 1529  BP: 176/74  Pulse: 83  Temp: 98.4 F (36.9 C)  TempSrc: Oral  Height: 5\' 2"  (1.575 m)  Weight: 128 lb 11.2 oz (58.378 kg)  SpO2: 99%    General: Alert, cooperative, NAD. Left orbital lesion, small laceration, clean, covered w/ bandage.  HEENT: PERRL, EOMI. Moist mucus membranes Neck: Full range of motion without pain, supple, no lymphadenopathy or carotid bruits Lungs: Clear to ascultation bilaterally, normal work of respiration, no wheezes, rales, rhonchi Heart: RRR, no murmurs, gallops, or rubs Abdomen: Soft, non-tender, non-distended, BS + Extremities: No cyanosis, clubbing, or edema. TTP bilaterally over feet and pretibial regions bilaterally.  Neurologic: Alert & oriented X3, cranial nerves II-XII intact, strength grossly intact, sensation intact to light touch   Assessment & Plan:   Please see problem based assessment and plan.

## 2014-09-17 ENCOUNTER — Encounter: Payer: Self-pay | Admitting: Internal Medicine

## 2014-09-17 NOTE — Assessment & Plan Note (Signed)
BP Readings from Last 3 Encounters:  09/16/14 176/74  07/29/14 139/83  06/04/14 160/90    Lab Results  Component Value Date   NA 139 04/05/2014   K 3.8 04/05/2014   CREATININE 1.10 04/05/2014    Assessment: Blood pressure control: mildly elevated Progress toward BP goal:  unchanged Comments: Takes Lisinopril 40 mg qd, HCTZ 25 mg qd. BP elevated today, claims that her leg pain may be contributing to this. Denies non-compliance.   Plan: Medications:  continue current medications Educational resources provided: brochure Self management tools provided:   Other plans: If BP elevated at next visit, can consider adjusting medications at that time.

## 2014-09-17 NOTE — Assessment & Plan Note (Signed)
Has tried Neurontin, Amitryptiline, and Nortriptyline at this point, all found to be ineffective.  -Given worsening of symptoms, will try duloxetine 60 mg po qd at this time -Patient to return on 4 weeks for re-evaluation after starting medication.

## 2014-09-17 NOTE — Progress Notes (Signed)
INTERNAL MEDICINE TEACHING ATTENDING ADDENDUM - Hernandez Losasso, MD: I reviewed and discussed at the time of visit with the resident Dr. Jones, the patient's medical history, physical examination, diagnosis and results of pertinent tests and treatment and I agree with the patient's care as documented.  

## 2014-09-17 NOTE — Assessment & Plan Note (Signed)
Flu shot given today

## 2014-09-17 NOTE — Assessment & Plan Note (Signed)
Lab Results  Component Value Date   HGBA1C 6.3 09/16/2014   HGBA1C 7.0 06/02/2014   HGBA1C 9.1 02/19/2014     Assessment: Diabetes control: good control (HgbA1C at goal) Progress toward A1C goal:  improved Comments: On Levemir 10 units, Novolog 2 units w/ dinner, and Metformin XR 1000 mg qd.   Plan: Medications:  continue current medications Home glucose monitoring: Frequency: 2 times a day Timing: N/A Instruction/counseling given: reminded to bring blood glucose meter & log to each visit and discussed diet Educational resources provided: brochure Self management tools provided:   Other plans: No changes at this time given significant improvement in diabetic control.

## 2014-09-17 NOTE — Assessment & Plan Note (Signed)
Patient w/ constipation, previously had issues w/ diarrhea prior to switching to XR Metformin. Has been taking Psyllium powder at home, according to the patient. May be associated w/ Nortriptyline (however patient not taking).  -Given Senna 8.6 mg qd prn.

## 2014-10-02 ENCOUNTER — Ambulatory Visit: Payer: Self-pay | Admitting: Podiatry

## 2014-10-17 ENCOUNTER — Ambulatory Visit (INDEPENDENT_AMBULATORY_CARE_PROVIDER_SITE_OTHER): Payer: No Typology Code available for payment source | Admitting: Podiatry

## 2014-10-17 ENCOUNTER — Encounter: Payer: Self-pay | Admitting: Podiatry

## 2014-10-17 VITALS — BP 153/78 | HR 89 | Resp 16

## 2014-10-17 DIAGNOSIS — E1149 Type 2 diabetes mellitus with other diabetic neurological complication: Secondary | ICD-10-CM

## 2014-10-17 DIAGNOSIS — B351 Tinea unguium: Secondary | ICD-10-CM

## 2014-10-17 DIAGNOSIS — M79673 Pain in unspecified foot: Secondary | ICD-10-CM

## 2014-10-17 DIAGNOSIS — E1140 Type 2 diabetes mellitus with diabetic neuropathy, unspecified: Secondary | ICD-10-CM

## 2014-10-17 NOTE — Patient Instructions (Addendum)
Diabetes and Foot Care Diabetes may cause you to have problems because of poor blood supply (circulation) to your feet and legs. This may cause the skin on your feet to become thinner, break easier, and heal more slowly. Your skin may become dry, and the skin may peel and crack. You may also have nerve damage in your legs and feet causing decreased feeling in them. You may not notice minor injuries to your feet that could lead to infections or more serious problems. Taking care of your feet is one of the most important things you can do for yourself.  HOME CARE INSTRUCTIONS  Wear shoes at all times, even in the house. Do not go barefoot. Bare feet are easily injured.  Check your feet daily for blisters, cuts, and redness. If you cannot see the bottom of your feet, use a mirror or ask someone for help.  Wash your feet with warm water (do not use hot water) and mild soap. Then pat your feet and the areas between your toes until they are completely dry. Do not soak your feet as this can dry your skin.  Apply a moisturizing lotion or petroleum jelly (that does not contain alcohol and is unscented) to the skin on your feet and to dry, brittle toenails. Do not apply lotion between your toes.  Trim your toenails straight across. Do not dig under them or around the cuticle. File the edges of your nails with an emery board or nail file.  Do not cut corns or calluses or try to remove them with medicine.  Wear clean socks or stockings every day. Make sure they are not too tight. Do not wear knee-high stockings since they may decrease blood flow to your legs.  Wear shoes that fit properly and have enough cushioning. To break in new shoes, wear them for just a few hours a day. This prevents you from injuring your feet. Always look in your shoes before you put them on to be sure there are no objects inside.  Do not cross your legs. This may decrease the blood flow to your feet.  If you find a minor scrape,  cut, or break in the skin on your feet, keep it and the skin around it clean and dry. These areas may be cleansed with mild soap and water. Do not cleanse the area with peroxide, alcohol, or iodine.  When you remove an adhesive bandage, be sure not to damage the skin around it.  If you have a wound, look at it several times a day to make sure it is healing.  Do not use heating pads or hot water bottles. They may burn your skin. If you have lost feeling in your feet or legs, you may not know it is happening until it is too late.  Make sure your health care provider performs a complete foot exam at least annually or more often if you have foot problems. Report any cuts, sores, or bruises to your health care provider immediately. SEEK MEDICAL CARE IF:   You have an injury that is not healing.  You have cuts or breaks in the skin.  You have an ingrown nail.  You notice redness on your legs or feet.  You feel burning or tingling in your legs or feet.  You have pain or cramps in your legs and feet.  Your legs or feet are numb.  Your feet always feel cold. SEEK IMMEDIATE MEDICAL CARE IF:   There is increasing redness,   swelling, or pain in or around a wound.  There is a red line that goes up your leg.  Pus is coming from a wound.  You develop a fever or as directed by your health care provider.  You notice a bad smell coming from an ulcer or wound. Document Released: 12/02/2000 Document Revised: 08/07/2013 Document Reviewed: 05/14/2013 [facility] Patient Information 2015 [place], [place]. This information is not intended to replace advice given to you by your health care provider. Make sure you discuss any questions you have with your health care provider.  Neuropathic Pain We often think that pain has a physical cause. If we get rid of the cause, the pain should go away. Nerves themselves can also cause pain. It is called neuropathic pain, which means nerve abnormality. It may be  difficult for the patients who have it and for the treating caregivers. Pain is usually described as acute (short-lived) or chronic (long-lasting). Acute pain is related to the physical sensations caused by an injury. It can last from a few seconds to many weeks, but it usually goes away when normal healing occurs. Chronic pain lasts beyond the typical healing time. With neuropathic pain, the nerve fibers themselves may be damaged or injured. They then send incorrect signals to other pain centers. The pain you feel is real, but the cause is not easy to find.  CAUSES  Chronic pain can result from diseases, such as diabetes and shingles (an infection related to chickenpox), or from trauma, surgery, or amputation. It can also happen without any known injury or disease. The nerves are sending pain messages, even though there is no identifiable cause for such messages.   Other common causes of neuropathy include diabetes, phantom limb pain, or Regional Pain Syndrome (RPS).  As with all forms of chronic back pain, if neuropathy is not correctly treated, there can be a number of associated problems that lead to a downward cycle for the patient. These include depression, sleeplessness, feelings of fear and anxiety, limited social interaction and inability to do normal daily activities or work.  The most dramatic and mysterious example of neuropathic pain is called "phantom limb syndrome." This occurs when an arm or a leg has been removed because of illness or injury. The brain still gets pain messages from the nerves that originally carried impulses from the missing limb. These nerves now seem to misfire and cause troubling pain.  Neuropathic pain often seems to have no cause. It responds poorly to standard pain treatment. Neuropathic pain can occur after:  Shingles (herpes zoster virus infection).  A lasting burning sensation of the skin, caused usually by injury to a peripheral nerve.  Peripheral  neuropathy which is widespread nerve damage, often caused by diabetes or alcoholism.  Phantom limb pain following an amputation.  Facial nerve problems (trigeminal neuralgia).  Multiple sclerosis.  Reflex sympathetic dystrophy.  Pain which comes with cancer and cancer chemotherapy.  Entrapment neuropathy such as when pressure is put on a nerve such as in carpal tunnel syndrome.  Back, leg, and hip problems (sciatica).  Spine or back surgery.  HIV Infection or AIDS where nerves are infected by viruses. Your caregiver can explain items in the above list which may apply to you. SYMPTOMS  Characteristics of neuropathic pain are:  Severe, sharp, electric shock-like, shooting, lightening-like, knife-like.  Pins and needles sensation.  Deep burning, deep cold, or deep ache.  Persistent numbness, tingling, or weakness.  Pain resulting from light touch or other stimulus that would  not usually cause pain.  Increased sensitivity to something that would normally cause pain, such as a pinprick. Pain may persist for months or years following the healing of damaged tissues. When this happens, pain signals no longer sound an alarm about current injuries or injuries about to happen. Instead, the alarm system itself is not working correctly.  Neuropathic pain may get worse instead of better over time. For some people, it can lead to serious disability. It is important to be aware that severe injury in a limb can occur without a proper, protective pain response.Burns, cuts, and other injuries may go unnoticed. Without proper treatment, these injuries can become infected or lead to further disability. Take any injury seriously, and consult your caregiver for treatment. DIAGNOSIS  When you have a pain with no known cause, your caregiver will probably ask some specific questions:   Do you have any other conditions, such as diabetes, shingles, multiple sclerosis, or HIV infection?  How would you  describe your pain? (Neuropathic pain is often described as shooting, stabbing, burning, or searing.)  Is your pain worse at any time of the day? (Neuropathic pain is usually worse at night.)  Does the pain seem to follow a certain physical pathway?  Does the pain come from an area that has missing or injured nerves? (An example would be phantom limb pain.)  Is the pain triggered by minor things such as rubbing against the sheets at night? These questions often help define the type of pain involved. Once your caregiver knows what is happening, treatment can begin. Anticonvulsant, antidepressant drugs, and various pain relievers seem to work in some cases. If another condition, such as diabetes is involved, better management of that disorder may relieve the neuropathic pain.  TREATMENT  Neuropathic pain is frequently long-lasting and tends not to respond to treatment with narcotic type pain medication. It may respond well to other drugs such as antiseizure and antidepressant medications. Usually, neuropathic problems do not completely go away, but partial improvement is often possible with proper treatment. Your caregivers have large numbers of medications available to treat you. Do not be discouraged if you do not get immediate relief. Sometimes different medications or a combination of medications will be tried before you receive the results you are hoping for. See your caregiver if you have pain that seems to be coming from nowhere and does not go away. Help is available.  SEEK IMMEDIATE MEDICAL CARE IF:   There is a sudden change in the quality of your pain, especially if the change is on only one side of the body.  You notice changes of the skin, such as redness, black or purple discoloration, swelling, or an ulcer.  You cannot move the affected limbs. Document Released: 09/01/2004 Document Revised: 02/27/2012 Document Reviewed: 09/01/2004 [facility] Patient Information 2015 [place], [place].  This information is not intended to replace advice given to you by your health care provider. Make sure you discuss any questions you have with your health care provider.

## 2014-10-17 NOTE — Progress Notes (Signed)
   Subjective:    Patient ID: Amanda [name], female    DOB: [birth year], 63 y.o.   MRN: [id number]  HPI Comments: "I have some problems with my feet"  Ms. Burrous, 63 year old female, presents the office today for diabetic risk assessment and painful elongated nails. She states that she periodically gets ingrown toenails.Patient also states that she has tingling and numbness to her feet going up to her knees and the pain is worse at night. She has previously been treated with Neurontin, amitriptyline, nortriptyline, duloxetine. She states that she is unable to tolerate these medications. She denies any history of ulceration. Denies any claudication symptoms. No other complaints at this time. Her last A1C was 6.3.     Review of Systems  All other systems reviewed and are negative.      Objective:   Physical Exam AAO x3, NAD DP/PT pulses palpable bilaterally, CRT less than 3 seconds Protective sensation decreased with Simms Weinstein monofilament, decreased vibratory sensation. Nails are hypertrophic, dystrophic, elongated, discolored without any surrounding erythema or drainage. Mild incurvation along the nail borders. No open lesions or pre-ulcerative lesions. Pain, swelling, warmth, erythema MMT 5/5, ROM WNL      Assessment & Plan:  63 year old female presents with diabetic risk assessment, painful elongated nails, polyneuropathy. -Treatment options discussed including alternatives, risks, complications. -Nail sharply debrided 10 without complications. -Patient has previously tried multiple medications for polyneuropathy. However she is unable to tolerate these medications. Possibly consider evaluation by neurology. -Discussed the importance of daily foot inspection. -Follow-up in 3 months or sooner if any problems are to arise. In the meantime call the office with any questions, concerns, change in symptoms.

## 2014-11-16 ENCOUNTER — Other Ambulatory Visit: Payer: Self-pay | Admitting: Internal Medicine

## 2014-11-22 ENCOUNTER — Emergency Department (HOSPITAL_COMMUNITY)
Admission: EM | Admit: 2014-11-22 | Discharge: 2014-11-22 | Disposition: A | Discharge status: Home / Self Care | Payer: Self-pay | Attending: Emergency Medicine | Admitting: Emergency Medicine

## 2014-11-22 ENCOUNTER — Encounter (HOSPITAL_COMMUNITY): Payer: Self-pay | Admitting: Emergency Medicine

## 2014-11-22 DIAGNOSIS — E785 Hyperlipidemia, unspecified: Secondary | ICD-10-CM | POA: Insufficient documentation

## 2014-11-22 DIAGNOSIS — Z7951 Long term (current) use of inhaled steroids: Secondary | ICD-10-CM | POA: Insufficient documentation

## 2014-11-22 DIAGNOSIS — Z79899 Other long term (current) drug therapy: Secondary | ICD-10-CM | POA: Insufficient documentation

## 2014-11-22 DIAGNOSIS — R51 Headache: Secondary | ICD-10-CM | POA: Insufficient documentation

## 2014-11-22 DIAGNOSIS — J069 Acute upper respiratory infection, unspecified: Secondary | ICD-10-CM | POA: Insufficient documentation

## 2014-11-22 DIAGNOSIS — H6592 Unspecified nonsuppurative otitis media, left ear: Secondary | ICD-10-CM | POA: Insufficient documentation

## 2014-11-22 DIAGNOSIS — Z8619 Personal history of other infectious and parasitic diseases: Secondary | ICD-10-CM | POA: Insufficient documentation

## 2014-11-22 DIAGNOSIS — Z794 Long term (current) use of insulin: Secondary | ICD-10-CM | POA: Insufficient documentation

## 2014-11-22 DIAGNOSIS — E1310 Other specified diabetes mellitus with ketoacidosis without coma: Secondary | ICD-10-CM | POA: Insufficient documentation

## 2014-11-22 DIAGNOSIS — I10 Essential (primary) hypertension: Secondary | ICD-10-CM | POA: Insufficient documentation

## 2014-11-22 DIAGNOSIS — M1990 Unspecified osteoarthritis, unspecified site: Secondary | ICD-10-CM | POA: Insufficient documentation

## 2014-11-22 DIAGNOSIS — Z7982 Long term (current) use of aspirin: Secondary | ICD-10-CM | POA: Insufficient documentation

## 2014-11-22 DIAGNOSIS — Z862 Personal history of diseases of the blood and blood-forming organs and certain disorders involving the immune mechanism: Secondary | ICD-10-CM | POA: Insufficient documentation

## 2014-11-22 MED ORDER — AMOXICILLIN 500 MG PO CAPS: 500.0000 mg | ORAL_CAPSULE | Freq: Three times a day (TID) | ORAL | Status: DC

## 2014-11-22 NOTE — ED Notes (Signed)
Pt. Stated, I've had ear pain for about a week, and sore throat for one day.

## 2014-11-22 NOTE — ED Provider Notes (Signed)
CSN: [id number]     Arrival date & time 11/22/14  1205 History  This chart was scribed for Margarita Mail, PA-C, working with Richarda Blade, MD by Steva Colder, ED Scribe. The patient was seen in room TR10C/TR10C at 1:08 PM.    Chief Complaint  Patient presents with  . Otalgia  . Sore Throat    The history is provided by the patient. No language interpreter was used.   HPI Comments: Amanda [name] is a 63 y.o. female who presents to the Emergency Department complaining of bilateral oltagia onset 1 week. She states that she is having associated symptoms of sore throat, HA, body aches, productive cough, hearing change, tinnitus. The cough is productive of clear phlegm. She woke up this morning with a sore throat. She didn't take any medications because she didn't know how it would affect her DM. She denies fever and any other symptoms.  Past Medical History  Diagnosis Date  . Diabetes mellitus   . Hypotension   . Influenza A (H1N1) 01/03/2013  . DKA (diabetic ketoacidoses) 01/02/2013  . Anemia   . SYNCOPE 05/10/2010    Qualifier: Diagnosis of  By: Marilynne Halsted, RN, BSN, Jacquelyn    . Hypertension   . Hyperlipidemia   . Allergy     SEASONAL  . Arthritis   . Cataract     PRESENT IN LEFT/ CATARACTS REMOVED FROM RIGHT  . Neuromuscular disorder     NEUROPATHY   Past Surgical History  Procedure Laterality Date  . Rotator cuff repair    . Abdominal hysterectomy      10 years ago   . Shoulder surgery      Rotator cuff tear   . Cataracts     Family History  Problem Relation Age of Onset  . Diabetes Mother   . Hypertension Mother   . Aneurysm Father   . Diabetes Sister   . Hypertension Sister   . Kidney disease Sister     One Kidney transplant, one sister on HD   History  Substance Use Topics  . Smoking status: Never Smoker   . Smokeless tobacco: Never Used  . Alcohol Use: No   OB History    No data available     Review of Systems  Constitutional: Negative for fever.   HENT: Positive for ear pain, hearing loss, sneezing, sore throat and tinnitus.   Respiratory: Positive for cough.       Allergies  Gabapentin  Home Medications   Prior to Admission medications   Medication Sig Start Date End Date Taking? Authorizing Provider  acetaminophen (TYLENOL) 325 MG tablet Take 2 tablets (650 mg total) by mouth every 6 (six) hours as needed (or Fever >/= 101). 01/05/13   Dorian Heckle, MD  amoxicillin (AMOXIL) 500 MG capsule Take 1 capsule (500 mg total) by mouth 3 (three) times daily. 11/22/14   Margarita Mail, PA-C  aspirin 81 MG tablet Take 81 mg by mouth daily.    Historical Provider, MD  BESIVANCE 0.6 % SUSP  04/18/14   Historical Provider, MD  brimonidine-timolol (COMBIGAN) 0.2-0.5 % ophthalmic solution Place 1 drop into both eyes every 12 (twelve) hours.    Historical Provider, MD  DULoxetine (CYMBALTA) 60 MG capsule Take 1 capsule (60 mg total) by mouth daily. 09/16/14   Corky Sox, MD  DUREZOL 0.05 % EMUL  04/18/14   Historical Provider, MD  fluticasone (FLONASE) 50 MCG/ACT nasal spray Place 2 sprays into both nostrils daily. 07/29/14 07/29/15  Corky Sox, MD  hydrochlorothiazide (HYDRODIURIL) 25 MG tablet TAKE ONE TABLET BY MOUTH ONCE DAILY 09/15/14   Corky Sox, MD  ibuprofen (ADVIL,MOTRIN) 200 MG tablet Take 400 mg by mouth every 6 (six) hours as needed.    Historical Provider, MD  insulin aspart (NOVOLOG) 100 UNIT/ML FlexPen Inject 2 Units into the skin daily before supper. 08/19/14   Corky Sox, MD  Insulin Detemir (LEVEMIR) 100 UNIT/ML Pen Inject 10 Units into the skin daily. 08/19/14   Corky Sox, MD  lisinopril (PRINIVIL,ZESTRIL) 40 MG tablet Take 1 tablet (40 mg total) by mouth daily. 11/18/14   Corky Sox, MD  metFORMIN (GLUCOPHAGE-XR) 500 MG 24 hr tablet TAKE TWO TABLETS BY MOUTH ONCE DAILY WITH  BREAKFAST 09/15/14   Corky Sox, MD  Multiple Vitamins-Minerals (WOMENS 50+ MULTI VITAMIN/MIN) TABS Take 1 tablet by mouth daily. 03/18/14   Lucious Groves, DO  ofloxacin (OCUFLOX) 0.3 % ophthalmic solution 1 drop 4 (four) times daily.    Historical Provider, MD  omeprazole (PRILOSEC) 40 MG capsule Take 1 capsule (40 mg total) by mouth daily. 01/17/14   Francesca Oman, DO  ondansetron (ZOFRAN) 4 MG tablet Take 1 tablet (4 mg total) by mouth every 6 (six) hours. 04/05/14   Shari A Upstill, PA-C  pravastatin (PRAVACHOL) 20 MG tablet TAKE ONE TABLET BY MOUTH ONCE DAILY 11/18/14   Corky Sox, MD  senna (SENOKOT) 8.6 MG TABS tablet Take 1 tablet (8.6 mg total) by mouth daily as needed for mild constipation. 09/16/14   Corky Sox, MD  simethicone (GAS-X) 80 MG chewable tablet Chew 1 tablet (80 mg total) by mouth 4 (four) times daily as needed for flatulence. 06/02/14 06/02/15  Corky Sox, MD   BP 161/90 mmHg  Pulse 90  Temp(Src) 97.8 F (36.6 C) (Oral)  Resp 18  SpO2 100%  Physical Exam  Constitutional: She is oriented to person, place, and time. She appears well-developed and well-nourished. No distress.  HENT:  Head: Normocephalic and atraumatic.  Mouth/Throat: No oropharyngeal exudate.  Mild Air fluid level on the left. Phlegm present. No tonsillar swelling.   Eyes: EOM are normal.  Neck: Neck supple. No tracheal deviation present.  Cardiovascular: Normal rate, regular rhythm and normal heart sounds.   Pulmonary/Chest: Effort normal and breath sounds normal. No respiratory distress. She has no wheezes. She has no rales.  Musculoskeletal: Normal range of motion.  Lymphadenopathy:       Head (right side): No preauricular and no posterior auricular adenopathy present.       Head (left side): Preauricular and posterior auricular adenopathy present.  No mastoid tenderness on the right.   Neurological: She is alert and oriented to person, place, and time.  Skin: Skin is warm and dry.  Psychiatric: She has a normal mood and affect. Her behavior is normal.  Nursing note and vitals reviewed.   ED Course  Procedures (including critical  care time) DIAGNOSTIC STUDIES: Oxygen Saturation is 100% on room air, normal by my interpretation.    COORDINATION OF CARE: 1:12 PM-Discussed treatment plan which includes abx with pt at bedside and pt agreed to plan.   Labs Review Labs Reviewed - No data to display  Imaging Review No results found.   EKG Interpretation None      MDM   Final diagnoses:  URI (upper respiratory infection)  OME (otitis media with effusion), left   + OME left side. Will treat with amoxicillin.  F/u with pcp No mastoid tenderness, afebrile, HDS  Patients symptoms are consistent with URI, likely viral etiology. Discussed that antibiotics are not indicated for viral infections. Pt will be discharged with symptomatic treatment.  Verbalizes understanding and is agreeable with plan. Pt is hemodynamically stable & in NAD prior to dc.   I personally performed the services described in this documentation, which was scribed in my presence. The recorded information has been reviewed and is accurate.    Margarita Mail, PA-C 11/22/14 [place], MD 11/22/14 [phone]

## 2014-11-22 NOTE — Discharge Instructions (Signed)
Upper Respiratory Infection, Adult An upper respiratory infection (URI) is also sometimes known as the common cold. The upper respiratory tract includes the nose, sinuses, throat, trachea, and bronchi. Bronchi are the airways leading to the lungs. Most people improve within 1 week, but symptoms can last up to 2 weeks. A residual cough may last even longer.  CAUSES Many different viruses can infect the tissues lining the upper respiratory tract. The tissues become irritated and inflamed and often become very moist. Mucus production is also common. A cold is contagious. You can easily spread the virus to others by oral contact. This includes kissing, sharing a glass, coughing, or sneezing. Touching your mouth or nose and then touching a surface, which is then touched by another person, can also spread the virus. SYMPTOMS  Symptoms typically develop 1 to 3 days after you come in contact with a cold virus. Symptoms vary from person to person. They may include:  Runny nose.  Sneezing.  Nasal congestion.  Sinus irritation.  Sore throat.  Loss of voice (laryngitis).  Cough.  Fatigue.  Muscle aches.  Loss of appetite.  Headache.  Low-grade fever. DIAGNOSIS  You might diagnose your own cold based on familiar symptoms, since most people get a cold 2 to 3 times a year. Your caregiver can confirm this based on your exam. Most importantly, your caregiver can check that your symptoms are not due to another disease such as strep throat, sinusitis, pneumonia, asthma, or epiglottitis. Blood tests, throat tests, and X-rays are not necessary to diagnose a common cold, but they may sometimes be helpful in excluding other more serious diseases. Your caregiver will decide if any further tests are required. RISKS AND COMPLICATIONS  You may be at risk for a more severe case of the common cold if you smoke cigarettes, have chronic heart disease (such as heart failure) or lung disease (such as asthma), or if  you have a weakened immune system. The very young and very old are also at risk for more serious infections. Bacterial sinusitis, middle ear infections, and bacterial pneumonia can complicate the common cold. The common cold can worsen asthma and chronic obstructive pulmonary disease (COPD). Sometimes, these complications can require emergency medical care and may be life-threatening. PREVENTION  The best way to protect against getting a cold is to practice good hygiene. Avoid oral or hand contact with people with cold symptoms. Wash your hands often if contact occurs. There is no clear evidence that vitamin C, vitamin E, echinacea, or exercise reduces the chance of developing a cold. However, it is always recommended to get plenty of rest and practice good nutrition. TREATMENT  Treatment is directed at relieving symptoms. There is no cure. Antibiotics are not effective, because the infection is caused by a virus, not by bacteria. Treatment may include:  Increased fluid intake. Sports drinks offer valuable electrolytes, sugars, and fluids.  Breathing heated mist or steam (vaporizer or shower).  Eating chicken soup or other clear broths, and maintaining good nutrition.  Getting plenty of rest.  Using gargles or lozenges for comfort.  Controlling fevers with ibuprofen or acetaminophen as directed by your caregiver.  Increasing usage of your inhaler if you have asthma. Zinc gel and zinc lozenges, taken in the first 24 hours of the common cold, can shorten the duration and lessen the severity of symptoms. Pain medicines may help with fever, muscle aches, and throat pain. A variety of non-prescription medicines are available to treat congestion and runny nose. Your caregiver  can make recommendations and may suggest nasal or lung inhalers for other symptoms.  HOME CARE INSTRUCTIONS   Only take over-the-counter or prescription medicines for pain, discomfort, or fever as directed by your  caregiver.  Use a warm mist humidifier or inhale steam from a shower to increase air moisture. This may keep secretions moist and make it easier to breathe.  Drink enough water and fluids to keep your urine clear or pale yellow.  Rest as needed.  Return to work when your temperature has returned to normal or as your caregiver advises. You may need to stay home longer to avoid infecting others. You can also use a face mask and careful hand washing to prevent spread of the virus. SEEK MEDICAL CARE IF:   After the first few days, you feel you are getting worse rather than better.  You need your caregiver's advice about medicines to control symptoms.  You develop chills, worsening shortness of breath, or [place] or red sputum. These may be signs of pneumonia.  You develop yellow or [place] nasal discharge or pain in the face, especially when you bend forward. These may be signs of sinusitis.  You develop a fever, swollen neck glands, pain with swallowing, or white areas in the back of your throat. These may be signs of strep throat. SEEK IMMEDIATE MEDICAL CARE IF:   You have a fever.  You develop severe or persistent headache, ear pain, sinus pain, or chest pain.  You develop wheezing, a prolonged cough, cough up blood, or have a change in your usual mucus (if you have chronic lung disease).  You develop sore muscles or a stiff neck. Document Released: 05/31/2001 Document Revised: 02/27/2012 Document Reviewed: 03/12/2014 [facility] Patient Information 2015 [place], [place]. This information is not intended to replace advice given to you by your health care provider. Make sure you discuss any questions you have with your health care provider.  Otitis Media Otitis media is redness, soreness, and inflammation of the middle ear. Otitis media may be caused by allergies or, most commonly, by infection. Often it occurs as a complication of the common cold. SIGNS AND SYMPTOMS Symptoms of otitis media  may include:  Earache.  Fever.  Ringing in your ear.  Headache.  Leakage of fluid from the ear. DIAGNOSIS To diagnose otitis media, your health care provider will examine your ear with an otoscope. This is an instrument that allows your health care provider to see into your ear in order to examine your eardrum. Your health care provider also will ask you questions about your symptoms. TREATMENT  Typically, otitis media resolves on its own within 3-5 days. Your health care provider may prescribe medicine to ease your symptoms of pain. If otitis media does not resolve within 5 days or is recurrent, your health care provider may prescribe antibiotic medicines if he or she suspects that a bacterial infection is the cause. HOME CARE INSTRUCTIONS   If you were prescribed an antibiotic medicine, finish it all even if you start to feel better.  Take medicines only as directed by your health care provider.  Keep all follow-up visits as directed by your health care provider. SEEK MEDICAL CARE IF:  You have otitis media only in one ear, or bleeding from your nose, or both.  You notice a lump on your neck.  You are not getting better in 3-5 days.  You feel worse instead of better. SEEK IMMEDIATE MEDICAL CARE IF:   You have pain that is not  controlled with medicine.  You have swelling, redness, or pain around your ear or stiffness in your neck.  You notice that part of your face is paralyzed.  You notice that the bone behind your ear (mastoid) is tender when you touch it. MAKE SURE YOU:   Understand these instructions.  Will watch your condition.  Will get help right away if you are not doing well or get worse. Document Released: 09/09/2004 Document Revised: 04/21/2014 Document Reviewed: 07/02/2013 [facility] Patient Information 2015 [place], [place]. This information is not intended to replace advice given to you by your health care provider. Make sure you discuss any questions you  have with your health care provider.

## 2014-11-22 NOTE — ED Notes (Signed)
Declined W/C at D/C and was escorted to lobby by RN. 

## 2015-01-13 ENCOUNTER — Ambulatory Visit: Payer: No Typology Code available for payment source | Admitting: Podiatry

## 2015-01-16 ENCOUNTER — Ambulatory Visit: Payer: No Typology Code available for payment source | Admitting: Podiatry

## 2015-04-10 ENCOUNTER — Other Ambulatory Visit: Payer: Self-pay | Admitting: Internal Medicine

## 2015-06-16 ENCOUNTER — Encounter: Payer: Self-pay | Admitting: Internal Medicine

## 2015-06-16 ENCOUNTER — Ambulatory Visit (INDEPENDENT_AMBULATORY_CARE_PROVIDER_SITE_OTHER): Payer: Self-pay | Admitting: Internal Medicine

## 2015-06-16 VITALS — BP 155/48 | HR 87 | Temp 97.9°F | Ht 61.5 in | Wt 141.3 lb

## 2015-06-16 DIAGNOSIS — E1142 Type 2 diabetes mellitus with diabetic polyneuropathy: Secondary | ICD-10-CM

## 2015-06-16 DIAGNOSIS — E08319 Diabetes mellitus due to underlying condition with unspecified diabetic retinopathy without macular edema: Secondary | ICD-10-CM

## 2015-06-16 DIAGNOSIS — E119 Type 2 diabetes mellitus without complications: Secondary | ICD-10-CM

## 2015-06-16 DIAGNOSIS — I10 Essential (primary) hypertension: Secondary | ICD-10-CM

## 2015-06-16 DIAGNOSIS — Z0000 Encounter for general adult medical examination without abnormal findings: Secondary | ICD-10-CM

## 2015-06-16 DIAGNOSIS — E1140 Type 2 diabetes mellitus with diabetic neuropathy, unspecified: Secondary | ICD-10-CM

## 2015-06-16 DIAGNOSIS — E11359 Type 2 diabetes mellitus with proliferative diabetic retinopathy without macular edema: Secondary | ICD-10-CM

## 2015-06-16 DIAGNOSIS — Z794 Long term (current) use of insulin: Secondary | ICD-10-CM

## 2015-06-16 LAB — GLUCOSE, CAPILLARY
Glucose-Capillary: 50 mg/dL — ABNORMAL LOW (ref 65–99)
Glucose-Capillary: 63 mg/dL — ABNORMAL LOW (ref 65–99)
Glucose-Capillary: 145 mg/dL — ABNORMAL HIGH (ref 65–99)

## 2015-06-16 LAB — POCT GLYCOSYLATED HEMOGLOBIN (HGB A1C): Hemoglobin A1C: 6.7

## 2015-06-16 MED ORDER — AMLODIPINE BESYLATE 5 MG PO TABS: 5.0000 mg | ORAL_TABLET | Freq: Every day | ORAL | Status: DC

## 2015-06-16 MED ORDER — INSULIN DETEMIR 100 UNIT/ML FLEXPEN: 8.0000 [IU] | PEN_INJECTOR | Freq: Every day | SUBCUTANEOUS | Status: DC

## 2015-06-16 MED ORDER — DULOXETINE HCL 60 MG PO CPEP: 60.0000 mg | ORAL_CAPSULE | Freq: Every day | ORAL | Status: DC

## 2015-06-16 NOTE — Assessment & Plan Note (Addendum)
HbA1c, Urine Microalb/cr, foot exam, BMP, ferral for ophthalmology placed today.

## 2015-06-16 NOTE — Patient Instructions (Signed)
1. Please schedule a follow up appointment for 1 week.   2. Please take all medications as previously prescribed with the following changes:  Start taking Norvasc 5 mg daily for BP.   STOP Novolog. Decrease Levemir to 8 units daily.  RESTART Cymbalta 60 mg daily for depression + neuropathic pain  3. If you have worsening of your symptoms or new symptoms arise, please call the clinic [phone]), or go to the ER immediately if symptoms are severe.  You have done a great job in taking all your medications. Please continue to do this.

## 2015-06-16 NOTE — Assessment & Plan Note (Addendum)
BP Readings from Last 3 Encounters:  06/16/15 155/48  11/22/14 169/81  10/17/14 153/78    Lab Results  Component Value Date   NA 143 06/16/2015   K 4.2 06/16/2015   CREATININE 0.97 06/16/2015    Assessment: Blood pressure control:  Significantly elevated today initially, SBP 205. Asymptomatic. Decreased to [id number] systolic by the ned of visit.  Comments: Taking Lisinopril 40 mg daily + HCTZ 25 mg daily. Claims to have been compliant w/ these.   Plan: Medications:  continue current medications; ADD Norvasc 5 mg daily.  Other plans: Patient to return in 1-2 weeks for BP recheck.

## 2015-06-16 NOTE — Progress Notes (Signed)
Subjective:   Patient ID: Amanda [name] female   DOB: 1951-03-12 64 y.o.   MRN: [id number]  HPI: [name] is a 64 y.o. female w/ PMHx of DM type II (w/ neuropathy), HTN, and dyslipidemia, presents to the clinic today for a follow up for DM. Patient hypoglycemic today, CBG in the 50's, only mildly symptomatic w/ slight tremulousness. Patient has been taking Metformin 1000 mg daily + Levemir 10 units daily + Novolog 2 units at dinner time. She states that her blood sugar may be between 50-70 5-6 times per month. She denies any real significant symptoms when her sugar is this low. No severely high blood sugars, states it is never over 180, according to the patient.  Her main complaint today is that of mild depression symptoms. States her sister passed recently, her daughter is having some behavioral issues, and her husband had a stroke some time ago and she is his primary caregiver. All of these things combined have been quite overwhelming for her. She is slightly tearful on exam. She denies suicidal or homicidal ideations.   The patient has elevated BP today, states she has taken her medication today and denies missing doses. She denies symptoms of headache, confusion, nausea, or vomiting.   Patient also states she is having worsening vision in her left eye. Patient has known h/o proliferative retinopathy, has seen ophthalmology in the past for this but has not seen someone in quite some time.   Still having neuropathic pain in feet bilaterally, started on Cymbalta at last visit, however, she states she had not taken it for more than 2 days as she states it wasn't helping.    Current Outpatient Prescriptions  Medication Sig Dispense Refill  . acetaminophen (TYLENOL) 325 MG tablet Take 2 tablets (650 mg total) by mouth every 6 (six) hours as needed (or Fever >/= 101). 60 tablet 0  . amLODipine (NORVASC) 5 MG tablet Take 1 tablet (5 mg total) by mouth daily. 30 tablet 2  . aspirin 81 MG  tablet Take 81 mg by mouth daily.    [name] BESIVANCE 0.6 % SUSP     . brimonidine-timolol (COMBIGAN) 0.2-0.5 % ophthalmic solution Place 1 drop into both eyes every 12 (twelve) hours.    . DULoxetine (CYMBALTA) 60 MG capsule Take 1 capsule (60 mg total) by mouth daily. 30 capsule 3  . DUREZOL 0.05 % EMUL     . fluticasone (FLONASE) 50 MCG/ACT nasal spray Place 2 sprays into both nostrils daily. 16 g 2  . hydrochlorothiazide (HYDRODIURIL) 25 MG tablet TAKE ONE TABLET BY MOUTH ONCE DAILY 30 tablet 5  . ibuprofen (ADVIL,MOTRIN) 200 MG tablet Take 400 mg by mouth every 6 (six) hours as needed.    . Insulin Detemir (LEVEMIR) 100 UNIT/ML Pen Inject 8 Units into the skin daily. 45 mL 4  . lisinopril (PRINIVIL,ZESTRIL) 40 MG tablet Take 1 tablet (40 mg total) by mouth daily. 30 tablet 5  . metFORMIN (GLUCOPHAGE-XR) 500 MG 24 hr tablet TAKE TWO TABLETS BY MOUTH ONCE DAILY WITH  BREAKFAST 60 tablet 5  . Multiple Vitamins-Minerals (WOMENS 50+ MULTI VITAMIN/MIN) TABS Take 1 tablet by mouth daily. 30 tablet   . ofloxacin (OCUFLOX) 0.3 % ophthalmic solution 1 drop 4 (four) times daily.    [name] omeprazole (PRILOSEC) 40 MG capsule Take 1 capsule (40 mg total) by mouth daily. 30 capsule 1  . ondansetron (ZOFRAN) 4 MG tablet Take 1 tablet (4 mg total) by mouth every  6 (six) hours. 12 tablet 0  . pravastatin (PRAVACHOL) 20 MG tablet TAKE ONE TABLET BY MOUTH ONCE DAILY 30 tablet 5  . senna (SENOKOT) 8.6 MG TABS tablet Take 1 tablet (8.6 mg total) by mouth daily as needed for mild constipation. 120 each 0  . simethicone (GAS-X) 80 MG chewable tablet Chew 1 tablet (80 mg total) by mouth 4 (four) times daily as needed for flatulence. 100 tablet 2   No current facility-administered medications for this visit.   Review of Systems  General: Denies fever, diaphoresis, appetite change, and fatigue.  Respiratory: Denies SOB, cough, and wheezing.   Cardiovascular: Denies chest pain and palpitations.  Gastrointestinal: Denies  nausea, vomiting, abdominal pain, and diarrhea Musculoskeletal: Denies myalgias, arthralgias, back pain, and gait problem.  Neurological: Denies dizziness, syncope, weakness, lightheadedness, and headaches.  Psychiatric/Behavioral: Denies mood changes, sleep disturbance, and agitation.   Objective:   Physical Exam: Filed Vitals:   06/16/15 1554 06/16/15 1658  BP: 205/97 155/48  Pulse: 88 87  Temp: 97.9 F (36.6 C)   TempSrc: Oral   Height: 5' 1.5" (1.562 m)   Weight: 141 lb 4.8 oz (64.093 kg)   SpO2: 100%     General: AA female, alert, cooperative, NAD. Slightly tearful on exam.  HEENT: PERRL, EOMI. Moist mucus membranes Neck: Full range of motion without pain, supple, no lymphadenopathy or carotid bruits Lungs: Clear to ascultation bilaterally, normal work of respiration, no wheezes, rales, rhonchi Heart: RRR, no murmurs, gallops, or rubs Abdomen: Soft, non-tender, non-distended, BS + Extremities: No cyanosis, clubbing, or edema Neurologic: Alert & oriented x3, cranial nerves II-XII intact, strength grossly intact, sensation intact to light touch   Assessment & Plan:   Please see problem based assessment and plan.

## 2015-06-17 LAB — MICROALBUMIN / CREATININE URINE RATIO
Creatinine, Urine: 35.2 mg/dL
Microalb Creat Ratio: 522.7 mg/g — ABNORMAL HIGH (ref 0.0–30.0)
Microalb, Ur: 18.4 mg/dL — ABNORMAL HIGH (ref <2.0)

## 2015-06-17 LAB — BASIC METABOLIC PANEL WITH GFR
BUN: 28 mg/dL — ABNORMAL HIGH (ref 6–23)
CO2: 26 mEq/L (ref 19–32)
Calcium: 9.2 mg/dL (ref 8.4–10.5)
Chloride: 108 mEq/L (ref 96–112)
Creat: 0.97 mg/dL (ref 0.50–1.10)
GFR, Est African American: 72 mL/min
GFR, Est Non African American: 63 mL/min
Glucose, Bld: 53 mg/dL — ABNORMAL LOW (ref 70–99)
Potassium: 4.2 mEq/L (ref 3.5–5.3)
Sodium: 143 mEq/L (ref 135–145)

## 2015-06-17 NOTE — Assessment & Plan Note (Signed)
Patient w/ known proliferative retinopathy, has worsening vision in her left eye. Has seen ophtho in the past. Has no insurance at this time, definitely needs follow up w/ ophthalmology.  -Referral placed

## 2015-06-17 NOTE — Progress Notes (Signed)
INTERNAL MEDICINE TEACHING ATTENDING ADDENDUM - Bejamin Hackbart, MD: I reviewed and discussed at the time of visit with the resident Dr. Jones, the patient's medical history, physical examination, diagnosis and results of pertinent tests and treatment and I agree with the patient's care as documented.  

## 2015-06-17 NOTE — Assessment & Plan Note (Signed)
Patient still having foot pain bilaterally. Has tried many things for this. Started Cymbalta at her last visit, she only took for 2 days as she states it didn't help. Emphasized importance to continuing for prolonged period of time.  -Continue Cymbalta for now. Patient will return 1-2 weeks. Can readdress neuropathy at that time.

## 2015-06-17 NOTE — Assessment & Plan Note (Signed)
Lab Results  Component Value Date   HGBA1C 6.7 06/16/2015   HGBA1C 6.3 09/16/2014   HGBA1C 7.0 06/02/2014     Assessment: Diabetes control:  Good control, has had hypoglycemia at home, 50's during visit today, increased to 130's by the end of the visit.  Comments: Currently on Levemir 10 units qhs, Metformin 1000 mg daily, and Novolog 2 units w/ dinner.   Plan: Medications: Decrease Levemir to 8 units, discontinue Novolog. Continue Metformin 1000 mg daily.  Instruction/counseling given: reminded to get eye exam, reminded to bring blood glucose meter & log to each visit, reminded to bring medications to each visit and discussed foot care Self management tools provided:  (UNABLE TO DOWNLOAD METER) Other plans: RTC in 1 week for BP recheck, can reassess home CBG's given change in DM medications.

## 2015-06-19 ENCOUNTER — Other Ambulatory Visit: Payer: Self-pay | Admitting: Internal Medicine

## 2015-06-30 ENCOUNTER — Ambulatory Visit: Payer: Self-pay

## 2015-07-02 ENCOUNTER — Ambulatory Visit: Payer: Self-pay

## 2015-07-06 ENCOUNTER — Ambulatory Visit: Payer: Self-pay

## 2015-07-06 ENCOUNTER — Ambulatory Visit (INDEPENDENT_AMBULATORY_CARE_PROVIDER_SITE_OTHER): Payer: Self-pay | Admitting: Internal Medicine

## 2015-07-06 ENCOUNTER — Encounter: Payer: Self-pay | Admitting: Internal Medicine

## 2015-07-06 VITALS — BP 151/73 | HR 85 | Temp 98.2°F | Ht 61.5 in | Wt 138.4 lb

## 2015-07-06 DIAGNOSIS — Z794 Long term (current) use of insulin: Secondary | ICD-10-CM

## 2015-07-06 DIAGNOSIS — E1142 Type 2 diabetes mellitus with diabetic polyneuropathy: Secondary | ICD-10-CM

## 2015-07-06 DIAGNOSIS — J302 Other seasonal allergic rhinitis: Secondary | ICD-10-CM

## 2015-07-06 DIAGNOSIS — I10 Essential (primary) hypertension: Secondary | ICD-10-CM

## 2015-07-06 DIAGNOSIS — Z79899 Other long term (current) drug therapy: Secondary | ICD-10-CM

## 2015-07-06 DIAGNOSIS — E119 Type 2 diabetes mellitus without complications: Secondary | ICD-10-CM

## 2015-07-06 LAB — GLUCOSE, CAPILLARY: Glucose-Capillary: 83 mg/dL (ref 65–99)

## 2015-07-06 MED ORDER — INSULIN DETEMIR 100 UNIT/ML FLEXPEN: 6.0000 [IU] | PEN_INJECTOR | Freq: Every day | SUBCUTANEOUS | Status: DC

## 2015-07-06 MED ORDER — LORATADINE 10 MG PO TABS
10.0000 mg | ORAL_TABLET | Freq: Every day | ORAL | Status: DC
Start: 2015-07-06 — End: 2015-10-06

## 2015-07-06 MED ORDER — FLUTICASONE PROPIONATE 50 MCG/ACT NA SUSP: 2.0000 | Freq: Every day | NASAL | Status: DC

## 2015-07-06 NOTE — Patient Instructions (Signed)
1. Please schedule a follow up appointment for 3 months.   2. Please take all medications as previously prescribed with the following changes:  Start taking Norvasc (amlodipine) 5 mg daily for blood pressure.   Start taking Cymbalta (duloxetine) 60 mg daily for depression and neuropathy pain.   Start using Flonase nasal spray and Claritin 10 mg daily for ear pain and sinus congestion.   DECREASE Levemir insulin to 6 units daily.   3. If you have worsening of your symptoms or new symptoms arise, please call the clinic [phone]), or go to the ER immediately if symptoms are severe.  You have done a great job in taking all your medications. Please continue to do this.

## 2015-07-06 NOTE — Progress Notes (Signed)
Subjective:   Patient ID: Amanda [name] female   DOB: [birth year] 64 y.o.   MRN: [id number]  HPI: [name] is a 64 y.o. female w/ PMHx of DM type II (w/ neuropathy), HTN, and dyslipidemia, presents to the clinic today for a follow up for DM. Patient is doing well today, states she has had less low blood sugars, but did state she had one in the 60's the other day. Has not yet picked up her Norvasc or Cymbalta as discussed at her last clinic visit.   Today she is complaining of some mild ear pain (bilateral), w/ associated sinus congestion. No discharge from the ear, no tenderness over the helix or the tragus. States sometimes she feels her hearing is slightly muffled, but denies tinnitus or any further issues.    Current Outpatient Prescriptions  Medication Sig Dispense Refill  . acetaminophen (TYLENOL) 325 MG tablet Take 2 tablets (650 mg total) by mouth every 6 (six) hours as needed (or Fever >/= 101). 60 tablet 0  . amLODipine (NORVASC) 5 MG tablet Take 1 tablet (5 mg total) by mouth daily. 30 tablet 2  . aspirin 81 MG tablet Take 81 mg by mouth daily.    [name] BESIVANCE 0.6 % SUSP     . brimonidine-timolol (COMBIGAN) 0.2-0.5 % ophthalmic solution Place 1 drop into both eyes every 12 (twelve) hours.    . DULoxetine (CYMBALTA) 60 MG capsule Take 1 capsule (60 mg total) by mouth daily. 30 capsule 3  . DUREZOL 0.05 % EMUL     . fluticasone (FLONASE) 50 MCG/ACT nasal spray Place 2 sprays into both nostrils daily. 16 g 2  . hydrochlorothiazide (HYDRODIURIL) 25 MG tablet TAKE ONE TABLET BY MOUTH ONCE DAILY 30 tablet 5  . ibuprofen (ADVIL,MOTRIN) 200 MG tablet Take 400 mg by mouth every 6 (six) hours as needed.    . Insulin Detemir (LEVEMIR) 100 UNIT/ML Pen Inject 8 Units into the skin daily. 45 mL 4  . lisinopril (PRINIVIL,ZESTRIL) 40 MG tablet TAKE ONE TABLET BY MOUTH ONCE DAILY 30 tablet 5  . metFORMIN (GLUCOPHAGE-XR) 500 MG 24 hr tablet TAKE TWO TABLETS BY MOUTH ONCE DAILY WITH   BREAKFAST 60 tablet 5  . Multiple Vitamins-Minerals (WOMENS 50+ MULTI VITAMIN/MIN) TABS Take 1 tablet by mouth daily. 30 tablet   . ofloxacin (OCUFLOX) 0.3 % ophthalmic solution 1 drop 4 (four) times daily.    [name] omeprazole (PRILOSEC) 40 MG capsule Take 1 capsule (40 mg total) by mouth daily. 30 capsule 1  . ondansetron (ZOFRAN) 4 MG tablet Take 1 tablet (4 mg total) by mouth every 6 (six) hours. 12 tablet 0  . pravastatin (PRAVACHOL) 20 MG tablet TAKE ONE TABLET BY MOUTH ONCE DAILY 30 tablet 5  . senna (SENOKOT) 8.6 MG TABS tablet Take 1 tablet (8.6 mg total) by mouth daily as needed for mild constipation. 120 each 0  . simethicone (GAS-X) 80 MG chewable tablet Chew 1 tablet (80 mg total) by mouth 4 (four) times daily as needed for flatulence. 100 tablet 2   No current facility-administered medications for this visit.   Review of Systems  General: Positive for ear pain. Denies fever, diaphoresis, appetite change, and fatigue.  Respiratory: Denies SOB, cough, and wheezing.   Cardiovascular: Denies chest pain and palpitations.  Gastrointestinal: Denies nausea, vomiting, abdominal pain, and diarrhea Musculoskeletal: Denies myalgias, arthralgias, back pain, and gait problem.  Neurological: Denies dizziness, syncope, weakness, lightheadedness, and headaches.  Psychiatric/Behavioral: Denies mood changes, sleep  disturbance, and agitation.   Objective:   Physical Exam: Filed Vitals:   07/06/15 1542  BP: 151/73  Pulse: 85  Temp: 98.2 F (36.8 C)  TempSrc: Oral  Height: 5' 1.5" (1.562 m)  Weight: 138 lb 6.4 oz (62.778 kg)  SpO2: 100%    General: AA female, alert, cooperative, NAD.  HEENT: PERRL, EOMI. Moist mucus membranes. Ears w/ no erythema, effusions, or abnormalities. Mild sinus tenderness (frontal, maxillary).  Neck: Full range of motion without pain, supple, no lymphadenopathy or carotid bruits Lungs: Clear to ascultation bilaterally, normal work of respiration, no wheezes,  rales, rhonchi Heart: RRR, no murmurs, gallops, or rubs Abdomen: Soft, non-tender, non-distended, BS + Extremities: No cyanosis, clubbing, or edema Neurologic: Alert & oriented x3, cranial nerves II-XII intact, strength grossly intact, sensation intact to light touch   Assessment & Plan:   Please see problem based assessment and plan.

## 2015-07-07 NOTE — Assessment & Plan Note (Signed)
Patient states she still had one low CBG into the 60's the other day. No CBG's over 140.  -Decrease Levemir to 6 units daily.  -Continue Metformin

## 2015-07-07 NOTE — Assessment & Plan Note (Signed)
BP Readings from Last 3 Encounters:  07/06/15 151/73  06/16/15 155/48  11/22/14 169/81    Lab Results  Component Value Date   NA 143 06/16/2015   K 4.2 06/16/2015   CREATININE 0.97 06/16/2015    Assessment: Blood pressure control:  Still mild elevation Comments: Has not picked up her Norvasc  Plan: Medications:  continue current medications; Lisinopril 40 mg daily, HCTZ 25 mg daily, + Norvasc 5 mg daily.  Other plans: RTC in 3 months

## 2015-07-07 NOTE — Assessment & Plan Note (Signed)
Likely cause of her ear pain and sinus pain. States she has sinus pressure and some post nasal drip as well. Ear exam not suggestive of infection.  -Start Flonase + Claritin

## 2015-07-11 NOTE — Progress Notes (Signed)
Internal Medicine Clinic Attending  Case discussed with Dr. Jones soon after the resident saw the patient.  We reviewed the resident's history and exam and pertinent patient test results.  I agree with the assessment, diagnosis, and plan of care documented in the resident's note. 

## 2015-07-23 ENCOUNTER — Ambulatory Visit: Payer: Self-pay

## 2015-07-27 DIAGNOSIS — Z961 Presence of intraocular lens: Secondary | ICD-10-CM | POA: Insufficient documentation

## 2015-07-28 ENCOUNTER — Ambulatory Visit: Payer: Self-pay

## 2015-08-05 ENCOUNTER — Ambulatory Visit: Payer: Self-pay

## 2015-08-08 ENCOUNTER — Other Ambulatory Visit: Payer: Self-pay | Admitting: Internal Medicine

## 2015-08-14 ENCOUNTER — Ambulatory Visit: Payer: Self-pay

## 2015-08-17 ENCOUNTER — Encounter: Payer: Self-pay | Admitting: *Deleted

## 2015-08-17 ENCOUNTER — Telehealth: Payer: Self-pay | Admitting: *Deleted

## 2015-08-17 NOTE — Telephone Encounter (Signed)
CALLED PATIENT X 2 LM FOR PATIENT TO CALL THE CLINIC. THIS IS IN REGARDS TO HER GETTING ORANGE WITH DEBORAH HILL FOR PAYING OUT OF POCKED FOR HER EYE EXAM.

## 2015-08-21 ENCOUNTER — Ambulatory Visit: Payer: Self-pay

## 2015-08-28 ENCOUNTER — Ambulatory Visit: Payer: Self-pay

## 2015-09-11 ENCOUNTER — Ambulatory Visit: Payer: Self-pay

## 2015-09-14 ENCOUNTER — Telehealth: Payer: Self-pay | Admitting: Licensed Clinical Social Worker

## 2015-09-14 NOTE — Telephone Encounter (Signed)
Amanda [name] was referred to CSW as pt is in need of a referral to an Ophthalmologist due to vision changes.  Pt is currently uninsured and has been unable to make her appointment with the Financial Counselor due to lack of transportation.  CSW placed call to Amanda [name], pt states her last appointment she thought was in the afternoon, but was in the morning.  Pt states she only has transportation available after 2:30 in the afternoon.  Amanda [name] is anticipating that she will be approved for the [facility] [employer] and feels that she has all her documentation.  CSW inquired if pt utilizes the [place] fixed route system.  Pt states she has not used it in many years and would need someone to go along with her for the first couple of times.   CSW discussed [facility] and provided the name of the assigned Social Worker for pt's last name.  CSW will send information in the mail regarding [employer] as an option for transportation as well as the fixed route for transportation to/from [facility].  Pt denies add'l social work needs at this time.

## 2015-09-22 ENCOUNTER — Other Ambulatory Visit: Payer: Self-pay | Admitting: Obstetrics and Gynecology

## 2015-09-22 DIAGNOSIS — Z1231 Encounter for screening mammogram for malignant neoplasm of breast: Secondary | ICD-10-CM

## 2015-10-02 ENCOUNTER — Ambulatory Visit: Payer: Self-pay

## 2015-10-06 ENCOUNTER — Other Ambulatory Visit: Payer: Self-pay | Admitting: Internal Medicine

## 2015-10-08 ENCOUNTER — Ambulatory Visit (HOSPITAL_COMMUNITY)
Admission: RE | Admit: 2015-10-08 | Discharge: 2015-10-08 | Disposition: A | Discharge status: Home / Self Care | Payer: Self-pay | Source: Ambulatory Visit | Attending: Obstetrics and Gynecology | Admitting: Obstetrics and Gynecology

## 2015-10-26 ENCOUNTER — Ambulatory Visit
Admission: RE | Admit: 2015-10-26 | Discharge: 2015-10-26 | Disposition: A | Discharge status: Home / Self Care | Payer: No Typology Code available for payment source | Source: Ambulatory Visit | Attending: Obstetrics and Gynecology | Admitting: Obstetrics and Gynecology

## 2015-10-26 DIAGNOSIS — Z1231 Encounter for screening mammogram for malignant neoplasm of breast: Secondary | ICD-10-CM

## 2015-10-27 ENCOUNTER — Encounter: Payer: Self-pay | Admitting: Internal Medicine

## 2015-10-27 ENCOUNTER — Ambulatory Visit (INDEPENDENT_AMBULATORY_CARE_PROVIDER_SITE_OTHER): Payer: Self-pay | Admitting: Internal Medicine

## 2015-10-27 VITALS — BP 124/69 | HR 87 | Temp 98.4°F | Ht 61.5 in | Wt 146.2 lb

## 2015-10-27 DIAGNOSIS — E1140 Type 2 diabetes mellitus with diabetic neuropathy, unspecified: Secondary | ICD-10-CM

## 2015-10-27 DIAGNOSIS — E1142 Type 2 diabetes mellitus with diabetic polyneuropathy: Secondary | ICD-10-CM

## 2015-10-27 DIAGNOSIS — Z7984 Long term (current) use of oral hypoglycemic drugs: Secondary | ICD-10-CM

## 2015-10-27 DIAGNOSIS — Z23 Encounter for immunization: Secondary | ICD-10-CM

## 2015-10-27 DIAGNOSIS — J302 Other seasonal allergic rhinitis: Secondary | ICD-10-CM

## 2015-10-27 DIAGNOSIS — Z0000 Encounter for general adult medical examination without abnormal findings: Secondary | ICD-10-CM

## 2015-10-27 DIAGNOSIS — I10 Essential (primary) hypertension: Secondary | ICD-10-CM

## 2015-10-27 DIAGNOSIS — K5900 Constipation, unspecified: Secondary | ICD-10-CM

## 2015-10-27 DIAGNOSIS — Z79899 Other long term (current) drug therapy: Secondary | ICD-10-CM

## 2015-10-27 DIAGNOSIS — Z794 Long term (current) use of insulin: Secondary | ICD-10-CM

## 2015-10-27 LAB — GLUCOSE, CAPILLARY: Glucose-Capillary: 167 mg/dL — ABNORMAL HIGH (ref 65–99)

## 2015-10-27 MED ORDER — FLUTICASONE PROPIONATE 50 MCG/ACT NA SUSP: 2.0000 | Freq: Every day | NASAL | Status: DC

## 2015-10-27 NOTE — Progress Notes (Signed)
Subjective:   Patient ID: [name] female   DOB: 10-12-1951 64 y.o.   MRN: [id number]  HPI: [name] is a 64 y.o. female w/ PMHx of DM type II (w/ neuropathy), HTN, and dyslipidemia, presents to the clinic today for a follow up for DM and HTN. Patient states she is doing well recently, feels her blood sugars are well controlled. No recent low CBG's. She still complains of neuropathic type pain in her feet and right hand. She has tried almost every therapy for neuropathic pain with no success, either adverse reaction or the therapy does not help. She states the pain is bearable but still wishes something could be done about it.  BP well controlled currently, states she has not been taking Norvasc as she never picked it up (despite multiple discussions about this medication). No recent issues with depression or anxiety. Patient has a known history of proliferative diabetic retinopathy and is now seeing an ophthalmologist at [facility] and has been receiving injections. She is currently not using any eye drops per request of her eye doctor.   Current Outpatient Prescriptions  Medication Sig Dispense Refill  . acetaminophen (TYLENOL) 325 MG tablet Take 2 tablets (650 mg total) by mouth every 6 (six) hours as needed (or Fever >/= 101). 60 tablet 0  . amLODipine (NORVASC) 5 MG tablet Take 1 tablet (5 mg total) by mouth daily. 30 tablet 2  . aspirin 81 MG tablet Take 81 mg by mouth daily.    . fluticasone (FLONASE) 50 MCG/ACT nasal spray Place 2 sprays into both nostrils daily. 16 g 2  . hydrochlorothiazide (HYDRODIURIL) 25 MG tablet TAKE ONE TABLET BY MOUTH ONCE DAILY 30 tablet 5  . Insulin Detemir (LEVEMIR) 100 UNIT/ML Pen Inject 6 Units into the skin daily. 45 mL 4  . lisinopril (PRINIVIL,ZESTRIL) 40 MG tablet TAKE ONE TABLET BY MOUTH ONCE DAILY 30 tablet 5  . loratadine (CLARITIN) 10 MG tablet TAKE ONE TABLET BY MOUTH ONCE DAILY 30 tablet 0  . metFORMIN (GLUCOPHAGE-XR) 500 MG 24 hr  tablet TAKE TWO TABLETS BY MOUTH ONCE DAILY WITH  BREAKFAST 60 tablet 5  . Multiple Vitamins-Minerals (WOMENS 50+ MULTI VITAMIN/MIN) TABS Take 1 tablet by mouth daily. 30 tablet   . pravastatin (PRAVACHOL) 20 MG tablet TAKE ONE TABLET BY MOUTH ONCE DAILY 30 tablet 5  . senna (SENOKOT) 8.6 MG TABS tablet Take 1 tablet (8.6 mg total) by mouth daily as needed for mild constipation. 120 each 0  . simethicone (GAS-X) 80 MG chewable tablet Chew 1 tablet (80 mg total) by mouth 4 (four) times daily as needed for flatulence. 100 tablet 2   No current facility-administered medications for this visit.   Review of Systems  General: Denies fever, diaphoresis, appetite change, and fatigue.  Respiratory: Denies SOB, cough, and wheezing.   Cardiovascular: Denies chest pain and palpitations.  Gastrointestinal: Denies nausea, vomiting, abdominal pain, and diarrhea Musculoskeletal: Positive for right wrist and bilateral foot pain. Denies myalgias, arthralgias, back pain, and gait problem.  Neurological: Denies dizziness, syncope, weakness, lightheadedness, and headaches.  Psychiatric/Behavioral: Denies mood changes, sleep disturbance, and agitation.   Objective:   Physical Exam: Filed Vitals:   10/27/15 1445  BP: 124/69  Pulse: 87  Temp: 98.4 F (36.9 C)  TempSrc: Oral  Height: 5' 1.5" (1.562 m)  Weight: 146 lb 3.2 oz (66.316 kg)  SpO2: 100%    General: AA female, alert, cooperative, NAD.  HEENT: PERRL, EOMI. Moist mucus  membranes.  Neck: Full range of motion without pain, supple, no lymphadenopathy or carotid bruits Lungs: Clear to ascultation bilaterally, normal work of respiration, no wheezes, rales, rhonchi Heart: RRR, no murmurs, gallops, or rubs Abdomen: Soft, non-tender, non-distended, BS + Extremities: No cyanosis, clubbing, or edema Neurologic: Alert & oriented x3, cranial nerves II-XII intact, strength grossly intact, sensation intact to light touch   Assessment & Plan:   Please  see problem based assessment and plan.

## 2015-10-27 NOTE — Patient Instructions (Signed)
General Instructions:  1. Please schedule a follow up appointment for 3 months.   2. Please take all medications as previously prescribed.  3. If you have worsening of your symptoms or new symptoms arise, please call the clinic [phone]), or go to the ER immediately if symptoms are severe.  You have done a great job in taking all your medications. Please continue to do this.  Please bring your medicines with you each time you come to clinic.  Medicines may include prescription medications, over-the-counter medications, herbal remedies, eye drops, vitamins, or other pills.   Progress Toward Treatment Goals:  Treatment Goal 10/27/2015  Hemoglobin A1C at goal  Blood pressure at goal  Prevent falls -    Self Care Goals & Plans:  Self Care Goal 10/27/2015  Manage my medications take my medicines as prescribed  Monitor my health keep track of my blood glucose  Eat healthy foods -  Be physically active -  Prevent falls -  Meeting treatment goals maintain the current self-care plan    Home Blood Glucose Monitoring 10/27/2015  Check my blood sugar 2 times a day  When to check my blood sugar before breakfast; at bedtime     Care Management & Community Referrals:  Referral 10/27/2015  Referrals made for care management support none needed  Referrals made to community resources none

## 2015-10-28 ENCOUNTER — Other Ambulatory Visit: Payer: Self-pay | Admitting: Obstetrics and Gynecology

## 2015-10-28 DIAGNOSIS — R928 Other abnormal and inconclusive findings on diagnostic imaging of breast: Secondary | ICD-10-CM

## 2015-10-28 NOTE — Assessment & Plan Note (Signed)
Still w/ bilateral foot pain and right wrist pain 2/2 diabetic neuropathy. We have tried Neurontin (adverse reaction; dizziness), TCA's (ineffective, dizziness), and Cymbalta (jitteriness, ineffective) which she does not wish to continue. Suspect that Lyrica would cause similar adverse effects in addition to the patient not having insurance, therefore this would be terribly expensive.  -Try Capsaicin cream prn for pain

## 2015-10-28 NOTE — Assessment & Plan Note (Signed)
Patient with mild allergies currently.  -Refill Flonase

## 2015-10-28 NOTE — Assessment & Plan Note (Addendum)
Flu shot given today. Had mammo yesterday, read pending.

## 2015-10-28 NOTE — Assessment & Plan Note (Signed)
Patient states she will go 34 days without having a bowel movement. No abdominal pain or nausea.  -Discussed fiber, metamucil, psyllium, increased fluid intake, exercise.  -Will give Rx for stool softener or Miralax prn if necessary if above modalities do not work.

## 2015-10-28 NOTE — Assessment & Plan Note (Signed)
BP Readings from Last 3 Encounters:  10/27/15 124/69  07/06/15 151/73  06/16/15 155/48    Lab Results  Component Value Date   NA 143 06/16/2015   K 4.2 06/16/2015   CREATININE 0.97 06/16/2015    Assessment: Blood pressure control: controlled Progress toward BP goal:  at goal Comments: Currently taking Lisinopril 40 mg daily + HCTZ 25 mg daily. States she has not been taking Norvasc despite previous discussions about this.   Plan: Medications:  Given BP control on 2 medications, will continue ACEI + HCTZ, stop Norvasc as she has not been taking this and her BP is well controlled.  Other plans: Can add Norvasc 5 mg daily if BP elevated at next clinic visit.

## 2015-10-28 NOTE — Assessment & Plan Note (Signed)
Lab Results  Component Value Date   HGBA1C 6.7 06/16/2015   HGBA1C 6.3 09/16/2014   HGBA1C 7.0 06/02/2014     Assessment: Diabetes control: good control (HgbA1C at goal) Progress toward A1C goal:  at goal Comments: CBG's at home in the low 100's.   Plan: Medications:  continue current medications; Metformin 1000 mg bid + Levemir 6 mg qhs Home glucose monitoring: Frequency: 2 times a day Timing: before breakfast, at bedtime Instruction/counseling given: reminded to get eye exam, reminded to bring blood glucose meter & log to each visit, reminded to bring medications to each visit, discussed foot care, discussed the need for weight loss and discussed diet Educational resources provided:   Self management tools provided: copy of home glucose meter download Other plans: RTC in 3 months

## 2015-11-02 ENCOUNTER — Other Ambulatory Visit: Payer: Self-pay

## 2015-11-02 NOTE — Progress Notes (Signed)
Internal Medicine Clinic Attending  Case discussed with Dr. Jones soon after the resident saw the patient.  We reviewed the resident's history and exam and pertinent patient test results.  I agree with the assessment, diagnosis, and plan of care documented in the resident's note. 

## 2015-11-03 ENCOUNTER — Other Ambulatory Visit (HOSPITAL_COMMUNITY): Payer: Self-pay | Admitting: *Deleted

## 2015-11-03 ENCOUNTER — Other Ambulatory Visit: Payer: Self-pay

## 2015-11-03 DIAGNOSIS — R928 Other abnormal and inconclusive findings on diagnostic imaging of breast: Secondary | ICD-10-CM

## 2015-11-06 ENCOUNTER — Encounter (HOSPITAL_COMMUNITY): Payer: Self-pay

## 2015-11-06 ENCOUNTER — Ambulatory Visit
Admission: RE | Admit: 2015-11-06 | Discharge: 2015-11-06 | Disposition: A | Discharge status: Home / Self Care | Payer: No Typology Code available for payment source | Source: Ambulatory Visit | Attending: Obstetrics and Gynecology | Admitting: Obstetrics and Gynecology

## 2015-11-06 ENCOUNTER — Ambulatory Visit (HOSPITAL_COMMUNITY)
Admission: RE | Admit: 2015-11-06 | Discharge: 2015-11-06 | Disposition: A | Discharge status: Home / Self Care | Payer: Self-pay | Source: Ambulatory Visit | Attending: Obstetrics and Gynecology | Admitting: Obstetrics and Gynecology

## 2015-11-06 VITALS — BP 158/84 | Temp 97.9°F | Ht 61.5 in | Wt 149.0 lb

## 2015-11-06 DIAGNOSIS — Z1239 Encounter for other screening for malignant neoplasm of breast: Secondary | ICD-10-CM

## 2015-11-06 DIAGNOSIS — R928 Other abnormal and inconclusive findings on diagnostic imaging of breast: Secondary | ICD-10-CM

## 2015-11-06 NOTE — Patient Instructions (Signed)
Educational materials on self breast awareness given. Explained to Amanda [name] that she did not need a Pap smear today due to her history of a hysterectomy for benign reasons. Informed patient that she no longer needs Pap smears due to her history of a hysterectomy for benign reasons. Referred patient to the [facility] for left breast diagnostic mammogram and possible ultrasound per recommendation. Appointment scheduled for Friday, November 06, 2015 at 1000. Patient aware of appointment and will be there. Amanda [name] verbalized understanding.  Norman Piacentini, Arvil Chaco, RN 8:50 AM

## 2015-11-06 NOTE — Progress Notes (Addendum)
Complaints of some occasional tingling of left breast since last mammogram. Patient referred to [facility] by the [facility] due to recommending additional imaging of the left breast. Screening mammogram completed 11/05/2015.  Pap Smear:  Pap smear not completed today. Last Pap smear was in 2014 at [facility] and normal per patient. Per patient has no history of an abnormal Pap smear. No pap smear results in EPIC. Patient has a history of a hysterectomy for fibroids 12/02/2003. Patient no longer needs Pap smears due to her history of a hysterectomy for benign reasons. Surgical pathology report is in EPIC.  Physical exam: Breasts Breasts symmetrical. No skin abnormalities bilateral breasts. No nipple retraction bilateral breasts. No nipple discharge bilateral breasts. No lymphadenopathy. No lumps palpated bilateral breasts. No complaints of pain or tenderness on exam. Referred patient to the [name] for left breast diagnostic mammogram and possible ultrasound per recommendation.     Pelvic/Bimanual No Pap smear completed today since patient has a history of a hysterectomy for benign reasons. Pap smear not indicated per BCCCP guidelines.

## 2015-11-21 ENCOUNTER — Other Ambulatory Visit: Payer: Self-pay | Admitting: Internal Medicine

## 2015-11-24 ENCOUNTER — Encounter: Payer: Self-pay | Admitting: Internal Medicine

## 2015-11-24 ENCOUNTER — Ambulatory Visit (INDEPENDENT_AMBULATORY_CARE_PROVIDER_SITE_OTHER): Payer: Self-pay | Admitting: Internal Medicine

## 2015-11-24 VITALS — BP 151/74 | HR 84 | Temp 97.9°F | Wt 144.8 lb

## 2015-11-24 DIAGNOSIS — Z794 Long term (current) use of insulin: Secondary | ICD-10-CM

## 2015-11-24 DIAGNOSIS — R5381 Other malaise: Secondary | ICD-10-CM

## 2015-11-24 DIAGNOSIS — E1142 Type 2 diabetes mellitus with diabetic polyneuropathy: Secondary | ICD-10-CM

## 2015-11-24 DIAGNOSIS — R300 Dysuria: Secondary | ICD-10-CM | POA: Insufficient documentation

## 2015-11-24 DIAGNOSIS — E119 Type 2 diabetes mellitus without complications: Secondary | ICD-10-CM

## 2015-11-24 DIAGNOSIS — Z7984 Long term (current) use of oral hypoglycemic drugs: Secondary | ICD-10-CM

## 2015-11-24 DIAGNOSIS — I10 Essential (primary) hypertension: Secondary | ICD-10-CM

## 2015-11-24 LAB — POCT URINALYSIS DIPSTICK
Bilirubin, UA: NEGATIVE
Glucose, UA: NEGATIVE
Ketones, UA: NEGATIVE
Nitrite, UA: NEGATIVE
Protein, UA: 100
Spec Grav, UA: 1.025
Urobilinogen, UA: 0.2
pH, UA: 5

## 2015-11-24 LAB — POCT GLYCOSYLATED HEMOGLOBIN (HGB A1C): Hemoglobin A1C: 7.2

## 2015-11-24 LAB — GLUCOSE, CAPILLARY: Glucose-Capillary: 78 mg/dL (ref 65–99)

## 2015-11-24 MED ORDER — METFORMIN HCL ER 500 MG PO TB24
ORAL_TABLET | ORAL | Status: DC
Start: 2015-11-24 — End: 2016-07-04

## 2015-11-24 MED ORDER — LORATADINE 10 MG PO TABS: 10.0000 mg | ORAL_TABLET | Freq: Every day | ORAL | Status: DC

## 2015-11-24 MED ORDER — LISINOPRIL 40 MG PO TABS: 40.0000 mg | ORAL_TABLET | Freq: Every day | ORAL | Status: DC

## 2015-11-24 MED ORDER — CAPSAICIN 0.025 % EX CREA: TOPICAL_CREAM | Freq: Two times a day (BID) | CUTANEOUS | Status: DC

## 2015-11-24 MED ORDER — HYDROCHLOROTHIAZIDE 25 MG PO TABS: 25.0000 mg | ORAL_TABLET | Freq: Every day | ORAL | Status: DC

## 2015-11-24 NOTE — Assessment & Plan Note (Addendum)
She has had dysuria and a green vaginal discharge for the last 3 months. Notably, she is postmenopausal and has had a total abdominal hysterectomy. She is currently sexually active with one partner. During the pelvic exam, she did not have vaginal discharge, but we obtained a urinalysis and GC/chlamydia and wet prep here in clinic today. Given her systemic malaise and worsening of her arthralgias, I question whether she may have disseminated gonococcus or perhaps reactive arthritis, although she does not have any skin lesions to suggest either of these diagnoses. Her symptoms are quite vague so I do not feel she needs empiric antibiotics at this point so we will follow her results and treat accordingly.  ADDENDUM: Her wet prep, GC chlamydia, and urinalysis were all normal; as mentioned, I did not see any vaginal discharge on pelvic exam. I called and left a voice mail for her to call me back.

## 2015-11-24 NOTE — Addendum Note (Signed)
Addended by: Truddie Crumble on: 11/24/2015 04:04 PM   Modules accepted: Orders

## 2015-11-24 NOTE — Assessment & Plan Note (Signed)
Her blood pressure today was 151/74. She says her home pressures have ranged between 130 to 150. I refilled several of her medications for her, she has follow-up with Dr. Ronnald Ramp in 2 months.

## 2015-11-24 NOTE — Patient Instructions (Signed)
Amanda [name],  It was a pleasure meeting you today, although I'm sorry was under such circumstances.  As we discussed, there could be a number of things causing your generalized tiredness, muscle aches, joint pains, and vaginal discharge. We've sent off samples from the pelvic exam, and if tested a number of things in your blood which we talked about, namely evaluating for rheumatoid arthritis, thyroid disease, or other types of infections. I'll be in touch with you with those results.  In the meantime, try taking Benadryl to help your runny nose and also help you sleep.  Take care, and let us know if there is anything else we can do to help.  Dr. Melburn Hake

## 2015-11-24 NOTE — Assessment & Plan Note (Addendum)
She has had ongoing malaise, myalgias, and arthralgias for the past year or so. She has a notable family history of rheumatoid arthritis in both sides of her family. Her exam is notable for diffuse point tenderness to her muscles, and some subtle fullness in her MCPs. The differential is very broad at this point, and includes rheumatoid arthritis, fibromyalgia, statin-induced myopathy, polymyositis, and hypothyroidism, so we will evaluate with a rheumatoid factor, CCP, TSH, ESR, CK, and HIV and hepatitis C since she has not been screened for these in the past.  ADDENDUM: All of the above-mentioned labs came back normal. Given her diffuse point tenderness and numerous complaints of pain, I suspect there is a big psychiatric component going on; specifically I'm wondering whether she has fibromyalgia. At the next visit, I think a PHQ9 would be nice to get some objective information of any underlying depression as well.

## 2015-11-24 NOTE — Addendum Note (Signed)
Addended by: Carlota Raspberry on: 11/24/2015 04:41 PM   Modules accepted: Orders

## 2015-11-24 NOTE — Progress Notes (Signed)
Patient ID: Amanda [name], female   DOB: [birth year], 64 y.o.   MRN: [id number]    Subjective:   Patient ID: Amanda [name] female   DOB: 1951/09/11 64 y.o.   MRN: [id number]  HPI: Amanda [name] is a 65 y.o. lady with a history of insulin-dependent type 2 diabetes complicated by neuropathy, hypertension, and hyperlipidemia, presenting to the clinic today with malaise, low-grade fevers, arthralgias, dysuria, vaginal discharge for the last 3 weeks.  She says the onset of her dysuria, vaginal discharge, and associated lower back pain began 3 weeks ago. The discharge is green and smells different. She thinks this is another yeast infection but she's not positive.  Her malaise, low-grade fevers, and arthralgias have been ongoing for the last year or so. She has an extensive history of rheumatoid arthritis in her family. She has morning stiffness in the morning that lasts 1-2 hours. She also complains of myalgias during this time. His pain is worsened when she presses on her muscles. She's been very stressed recently as her husband had a stroke in 2011 and her daughter is engaged in delinquent activities. She's been sleeping about 5 hours per night.  Please see the assessment and plan for the status of the patient's chronic medical problems.   Past Medical History  Diagnosis Date  . Diabetes mellitus   . Hypotension   . Influenza A (H1N1) 01/03/2013  . DKA (diabetic ketoacidoses) ([place]) 01/02/2013  . Anemia   . SYNCOPE 05/10/2010    Qualifier: Diagnosis of  By: Marilynne Halsted, RN, BSN, Jacquelyn    . Hypertension   . Hyperlipidemia   . Allergy     SEASONAL  . Arthritis   . Cataract     PRESENT IN LEFT/ CATARACTS REMOVED FROM RIGHT  . Neuromuscular disorder ([place])     NEUROPATHY   Current Outpatient Prescriptions  Medication Sig Dispense Refill  . acetaminophen (TYLENOL) 325 MG tablet Take 2 tablets (650 mg total) by mouth every 6 (six) hours as needed (or Fever >/= 101). 60 tablet 0  . aspirin 81  MG tablet Take 81 mg by mouth daily.    . fluticasone (FLONASE) 50 MCG/ACT nasal spray Place 2 sprays into both nostrils daily. 16 g 2  . hydrochlorothiazide (HYDRODIURIL) 25 MG tablet TAKE ONE TABLET BY MOUTH ONCE DAILY 30 tablet 5  . Insulin Detemir (LEVEMIR) 100 UNIT/ML Pen Inject 6 Units into the skin daily. 45 mL 4  . lisinopril (PRINIVIL,ZESTRIL) 40 MG tablet TAKE ONE TABLET BY MOUTH ONCE DAILY 30 tablet 5  . loratadine (CLARITIN) 10 MG tablet TAKE ONE TABLET BY MOUTH ONCE DAILY 30 tablet 0  . metFORMIN (GLUCOPHAGE-XR) 500 MG 24 hr tablet TAKE TWO TABLETS BY MOUTH ONCE DAILY WITH  BREAKFAST 60 tablet 5  . Multiple Vitamins-Minerals (WOMENS 50+ MULTI VITAMIN/MIN) TABS Take 1 tablet by mouth daily. 30 tablet   . pravastatin (PRAVACHOL) 20 MG tablet TAKE ONE TABLET BY MOUTH ONCE DAILY 30 tablet 5  . senna (SENOKOT) 8.6 MG TABS tablet Take 1 tablet (8.6 mg total) by mouth daily as needed for mild constipation. 120 each 0  . simethicone (GAS-X) 80 MG chewable tablet Chew 1 tablet (80 mg total) by mouth 4 (four) times daily as needed for flatulence. 100 tablet 2   No current facility-administered medications for this visit.   Family History  Problem Relation Age of Onset  . Diabetes Mother   . Hypertension Mother   . Aneurysm Father   . Diabetes  Sister   . Hypertension Sister   . Kidney disease Sister     One Kidney transplant, one sister on HD  . Breast cancer Brother    Social History   Social History  . Marital Status: Married    Spouse Name: N/A  . Number of Children: N/A  . Years of Education: N/A   Social History Main Topics  . Smoking status: Never Smoker   . Smokeless tobacco: Never Used  . Alcohol Use: No  . Drug Use: No  . Sexual Activity: Yes    Birth Control/ Protection: Surgical   Other Topics Concern  . Not on file   Social History Narrative   Review of Systems  Constitutional: Positive for malaise/fatigue. Negative for fever, chills and weight loss.    Eyes: Negative for blurred vision and double vision.  Respiratory: Negative for cough.   Cardiovascular: Negative for chest pain.  Gastrointestinal: Positive for abdominal pain. Negative for heartburn, nausea, vomiting, diarrhea, constipation, blood in stool and melena.  Genitourinary: Positive for dysuria, urgency and flank pain. Negative for frequency and hematuria.  Musculoskeletal: Positive for myalgias and joint pain.  Skin: Negative for itching and rash.  Neurological: Positive for weakness and headaches. Negative for dizziness, sensory change, focal weakness and loss of consciousness.  Psychiatric/Behavioral: Positive for depression. The patient is nervous/anxious.      Objective:  Physical Exam: Filed Vitals:   11/24/15 1429  BP: 151/74  Pulse: 84  Temp: 97.9 F (36.6 C)  TempSrc: Oral  Weight: 144 lb 12.8 oz (65.681 kg)  SpO2: 100%   General: resting in chair comfortably, appropriately conversational HEENT: no scleral icterus, extra-ocular muscles intact, oropharynx without lesions Cardiac: regular rate and rhythm, no rubs, murmurs or gallops Pulm: breathing well, clear to auscultation bilaterally Abd: bowel sounds normal, soft, slightly tender in the suprapubic region, no CVA tenderness Ext: warm and well perfused, without pedal edema MSK: There is subtle fullness in the MCPs bilaterally, but no other evidence of synovitis. She has point tenderness in nearly all of her muscle groups. Good range of motion of the shoulders and knees. Lymph: no cervical or supraclavicular lymphadenopathy Skin: no rash, hair, or nail changes Neuro: alert and oriented X3, cranial nerves II-XII grossly intact, moving all extremities well  Assessment & Plan:   Please see problem-based charting for my assessment and plan. Please see problem-based charting for my assessment and plan.

## 2015-11-24 NOTE — Assessment & Plan Note (Signed)
She says her home sugars have all been below 180. Her A1c today was 7.2 She is currently on metformin and 6 units Levemir in the evenings. I refilled her metformin today.

## 2015-11-25 LAB — HIV ANTIBODY (ROUTINE TESTING W REFLEX): HIV Screen 4th Generation wRfx: NONREACTIVE

## 2015-11-25 LAB — CK: Total CK: 131 U/L (ref 24–173)

## 2015-11-25 LAB — CERVICOVAGINAL ANCILLARY ONLY
Chlamydia: NEGATIVE
Neisseria Gonorrhea: NEGATIVE

## 2015-11-25 LAB — HEPATITIS C ANTIBODY: Hep C Virus Ab: <0.1 s/co ratio (ref 0.0–0.9)

## 2015-11-25 LAB — TSH: TSH: 1.52 u[IU]/mL (ref 0.450–4.500)

## 2015-11-25 LAB — RHEUMATOID FACTOR: Rhuematoid fact SerPl-aCnc: <10 IU/mL (ref 0.0–13.9)

## 2015-11-25 LAB — SEDIMENTATION RATE: Sed Rate: 8 mm/hr (ref 0–40)

## 2015-11-25 NOTE — Progress Notes (Signed)
Internal Medicine Clinic Attending  I saw and evaluated the patient.  I personally confirmed the key portions of the history and exam documented by Dr. Melburn Hake and I reviewed pertinent patient test results.  The assessment, diagnosis, and plan were formulated together and I agree with the documentation in the resident's note.  Patient with diffuse polyarthralgias and morning stiffness in her hands. I don't detect any synovitis on exam. RF and ESR are negative today, so I doubt RA as the cause. We will treat conservatively, follow up culture of vaginal discharge for GU infection that may be causing reactive arthritis.

## 2015-11-26 ENCOUNTER — Telehealth: Payer: Self-pay | Admitting: Internal Medicine

## 2015-11-26 ENCOUNTER — Ambulatory Visit (HOSPITAL_COMMUNITY): Payer: Self-pay

## 2015-11-26 LAB — URINE CULTURE

## 2015-11-26 LAB — CERVICOVAGINAL ANCILLARY ONLY: Wet Prep (BD Affirm): NEGATIVE

## 2015-11-26 LAB — CYCLIC CITRUL PEPTIDE ANTIBODY, IGG/IGA: Cyclic Citrullin Peptide Ab: 12 units (ref 0–19)

## 2015-11-26 NOTE — Telephone Encounter (Signed)
I tried calling Miss Appell to give her the completely normal results from all of the lab work we did 2 days ago; unfortunately she didn't answer, so I left a voicemail requesting her to call me back at the office sometime today.

## 2015-11-27 ENCOUNTER — Other Ambulatory Visit: Payer: Self-pay | Admitting: Internal Medicine

## 2015-11-27 DIAGNOSIS — E1142 Type 2 diabetes mellitus with diabetic polyneuropathy: Secondary | ICD-10-CM

## 2015-11-27 MED ORDER — CAPSAICIN-MENTHOL-METHYL SAL 0.025-1-12 % EX CREA: TOPICAL_CREAM | CUTANEOUS | Status: DC

## 2015-12-01 ENCOUNTER — Telehealth: Payer: Self-pay | Admitting: Internal Medicine

## 2015-12-01 NOTE — Telephone Encounter (Addendum)
Patient would like to know why she was not able to get her Norvasc medications.  She would also like to make sure why Dr Ronnald Ramp prescribed medcation.

## 2015-12-01 NOTE — Telephone Encounter (Signed)
Called pt, discussed nov appt w/ dr Ronnald Ramp when she stated she had not been taking norvasc, states there was a mix up, states her bp is good and she has been w/o norvasc, wants appt for re-eval of bp. 12/27 0945 dr Ronnald Ramp

## 2015-12-15 ENCOUNTER — Ambulatory Visit: Payer: Self-pay | Admitting: Internal Medicine

## 2015-12-28 ENCOUNTER — Other Ambulatory Visit: Payer: Self-pay | Admitting: Internal Medicine

## 2016-01-20 ENCOUNTER — Ambulatory Visit: Payer: Self-pay | Admitting: Internal Medicine

## 2016-01-26 ENCOUNTER — Ambulatory Visit: Payer: Self-pay | Admitting: Internal Medicine

## 2016-01-26 LAB — HM DIABETES EYE EXAM

## 2016-02-01 ENCOUNTER — Ambulatory Visit: Payer: Self-pay | Admitting: Internal Medicine

## 2016-02-01 ENCOUNTER — Other Ambulatory Visit: Payer: Self-pay | Admitting: Internal Medicine

## 2016-02-08 ENCOUNTER — Encounter: Payer: Self-pay | Admitting: Internal Medicine

## 2016-02-08 ENCOUNTER — Ambulatory Visit (INDEPENDENT_AMBULATORY_CARE_PROVIDER_SITE_OTHER): Payer: Self-pay | Admitting: Internal Medicine

## 2016-02-08 VITALS — BP 121/72 | HR 88 | Temp 98.4°F | Ht 61.5 in | Wt 145.4 lb

## 2016-02-08 DIAGNOSIS — Z7984 Long term (current) use of oral hypoglycemic drugs: Secondary | ICD-10-CM

## 2016-02-08 DIAGNOSIS — K5900 Constipation, unspecified: Secondary | ICD-10-CM

## 2016-02-08 DIAGNOSIS — E1142 Type 2 diabetes mellitus with diabetic polyneuropathy: Secondary | ICD-10-CM

## 2016-02-08 DIAGNOSIS — Z79899 Other long term (current) drug therapy: Secondary | ICD-10-CM

## 2016-02-08 DIAGNOSIS — Z0000 Encounter for general adult medical examination without abnormal findings: Secondary | ICD-10-CM

## 2016-02-08 DIAGNOSIS — I10 Essential (primary) hypertension: Secondary | ICD-10-CM

## 2016-02-08 DIAGNOSIS — F331 Major depressive disorder, recurrent, moderate: Secondary | ICD-10-CM | POA: Insufficient documentation

## 2016-02-08 DIAGNOSIS — Z794 Long term (current) use of insulin: Secondary | ICD-10-CM

## 2016-02-08 LAB — GLUCOSE, CAPILLARY: Glucose-Capillary: 107 mg/dL — ABNORMAL HIGH (ref 65–99)

## 2016-02-08 MED ORDER — SERTRALINE HCL 50 MG PO TABS: 50.0000 mg | ORAL_TABLET | Freq: Every day | ORAL | Status: DC

## 2016-02-08 MED ORDER — CAPSAICIN-MENTHOL-METHYL SAL 0.025-1-12 % EX CREA: TOPICAL_CREAM | CUTANEOUS | Status: DC

## 2016-02-08 NOTE — Progress Notes (Signed)
Subjective:   Patient ID: Amanda [name] female   DOB: [birth year] 65 y.o.   MRN: [id number]  HPI: [name] is a 65 y.o. female w/ PMHx of DM type II (w/ neuropathy), HTN, and dyslipidemia, presents to the clinic today for a follow up for DM and HTN. Her main complaint today is that of symptoms of depression. She feels this has been an issue for some time and has been a topic of discussion during out clinic visits before. Previously she had been reluctant to start medications or address her depression. Today, she says she is doing well overall, but continues to have decreased energy, sleep difficulty, feelings of guilt, mild issues with concentration, and almost daily feelings of sadness and anxiety. She denies feelings of hopelessness, changes in her appetite, and suicidal ideations.   Current Outpatient Prescriptions  Medication Sig Dispense Refill  . acetaminophen (TYLENOL) 325 MG tablet Take 2 tablets (650 mg total) by mouth every 6 (six) hours as needed (or Fever >/= 101). 60 tablet 0  . aspirin 81 MG tablet Take 81 mg by mouth daily.    . Capsaicin-Menthol-Methyl Sal (CAPSAICIN-METHYL SAL-MENTHOL) 0.025-1-12 % CREA Apply over painful areas 1 Tube 3  . fluticasone (FLONASE) 50 MCG/ACT nasal spray Place 2 sprays into both nostrils daily. 16 g 2  . hydrochlorothiazide (HYDRODIURIL) 25 MG tablet Take 1 tablet (25 mg total) by mouth daily. 30 tablet 5  . Insulin Detemir (LEVEMIR) 100 UNIT/ML Pen Inject 6 Units into the skin daily. 45 mL 4  . lisinopril (PRINIVIL,ZESTRIL) 40 MG tablet TAKE ONE TABLET BY MOUTH ONCE DAILY 30 tablet 5  . loratadine (CLARITIN) 10 MG tablet TAKE ONE TABLET BY MOUTH ONCE DAILY 30 tablet 1  . metFORMIN (GLUCOPHAGE-XR) 500 MG 24 hr tablet TAKE TWO TABLETS BY MOUTH ONCE DAILY WITH  BREAKFAST 60 tablet 5  . Multiple Vitamins-Minerals (WOMENS 50+ MULTI VITAMIN/MIN) TABS Take 1 tablet by mouth daily. 30 tablet   . pravastatin (PRAVACHOL) 20 MG tablet TAKE ONE  TABLET BY MOUTH ONCE DAILY 30 tablet 5  . senna (SENOKOT) 8.6 MG TABS tablet Take 1 tablet (8.6 mg total) by mouth daily as needed for mild constipation. 120 each 0  . simethicone (GAS-X) 80 MG chewable tablet Chew 1 tablet (80 mg total) by mouth 4 (four) times daily as needed for flatulence. 100 tablet 2   No current facility-administered medications for this visit.   Review of Systems  General: Denies fever, diaphoresis, appetite change, and fatigue.  Respiratory: Denies SOB, cough, and wheezing.   Cardiovascular: Denies chest pain and palpitations.  Gastrointestinal: Denies nausea, vomiting, abdominal pain, and diarrhea Musculoskeletal: Denies myalgias, arthralgias, back pain, and gait problem.  Neurological: Denies dizziness, syncope, weakness, lightheadedness, and headaches.  Psychiatric/Behavioral: Positive for depression and mild sleep disturbance. Denies agitation.    Objective:   Physical Exam: Filed Vitals:   02/08/16 1433  BP: 121/72  Pulse: 88  Temp: 98.4 F (36.9 C)  TempSrc: Oral  Height: 5' 1.5" (1.562 m)  Weight: 145 lb 6.4 oz (65.953 kg)  SpO2: 99%    General: AA female, alert, cooperative, NAD.  HEENT: PERRL, EOMI. Moist mucus membranes.  Neck: Full range of motion without pain, supple, no lymphadenopathy or carotid bruits Lungs: Clear to ascultation bilaterally, normal work of respiration, no wheezes, rales, rhonchi Heart: RRR, no murmurs, gallops, or rubs Abdomen: Soft, non-tender, non-distended, BS + Extremities: No cyanosis, clubbing, or edema Neurologic: Alert & oriented x3, cranial nerves  II-XII intact, strength grossly intact, sensation intact to light touch   Assessment & Plan:   Please see problem based assessment and plan.

## 2016-02-08 NOTE — Patient Instructions (Signed)
1. Please make a follow up appointment for 4 weeks.   2. Please take all medications as previously prescribed with the following changes:  Start taking Zoloft 50 mg daily.   We will call you regarding eye doctor and setting up a bone density study.   3. If you have worsening of your symptoms or new symptoms arise, please call the clinic [phone] or go to the ER immediately if symptoms are severe.  You have done a great job in taking all your medications. Please continue to do this.

## 2016-02-09 NOTE — Assessment & Plan Note (Signed)
Discussed use of Capsaicin. Sent Rx to pharmacy. Has failed other therapies in the past.

## 2016-02-09 NOTE — Assessment & Plan Note (Addendum)
Patient has intermittent constipation and diarrhea at times. Wonder if this is a component of IBS given her extensive complaints with ongoing depression. Previous colonoscopy with no explanation for this.  -Continue Senna prn for constipation if she goes > 1-2 days without BM -See depression A&P

## 2016-02-09 NOTE — Assessment & Plan Note (Signed)
Patient describes symptoms of moderate depression involving daily feelings of sadness, guilt, difficulty concentrating, low energy, sleep difficulties, feelings of anxiety, and irritability. Denies changes in appetite and suicidal ideations. Says she has had these symptoms for quite some time, has previously been reluctant to treat her depression. Interested in doing something at this time.  -Referral for psychology -Start Zoloft 50 mg daily -RTC in 4 weeks for follow up -Had extensive discussion about continuing medication for at least 6 weeks before determining effectiveness. She understands she may not experience benefit for several weeks.

## 2016-02-09 NOTE — Assessment & Plan Note (Signed)
Most recent HbA1c 7.2. CBG's seem to be reasonably well controlled per the patient.  -Continue Metformin + Levemir 6 units daily (has previously been sensitive to even mild increases in insulin).

## 2016-02-09 NOTE — Assessment & Plan Note (Signed)
Patient given referral for bone density today. Also has known h/o diabetic retinopathy, follows with a retinal specialist at [facility], but wants to see an eye doctor here in [place]. Given referral for this. Also interested in OB/GYN/women's health specialist referral as well. Previously had hysterectomy, no longer requires Pap. Previous mammo in 10/2015 read as normal.

## 2016-02-09 NOTE — Assessment & Plan Note (Signed)
BP Readings from Last 3 Encounters:  02/08/16 121/72  11/24/15 151/74  11/06/15 158/84    Lab Results  Component Value Date   NA 143 06/16/2015   K 4.2 06/16/2015   CREATININE 0.97 06/16/2015    Assessment: Blood pressure control:  Well controlled today.  Progress toward BP goal:   At goal Comments: Taking Lisinopril 40 mg daily + HCTZ 25 mg daily  Plan: Medications:  continue current medications; not necessary to add another agent at this time.  Educational resources provided: brochure (denies) Self management tools provided:   Other plans: RTC in 4 weeks for follow up regarding other medical issues.

## 2016-02-10 ENCOUNTER — Telehealth: Payer: Self-pay | Admitting: Licensed Clinical Social Worker

## 2016-02-10 NOTE — Progress Notes (Signed)
Internal Medicine Clinic Attending  Case discussed with Dr. Jones soon after the resident saw the patient.  We reviewed the resident's history and exam and pertinent patient test results.  I agree with the assessment, diagnosis, and plan of care documented in the resident's note. 

## 2016-02-10 NOTE — Telephone Encounter (Signed)
Amanda [name] was referred to CSW for counseling referrals.  Pt is currently uninsured but has completed [employer] Counselor forms and has the [employer] card.  CSW gathered listing of available providers.  Call placed to Amanda [name] to discuss referral.  Pt continues to desire counseling services and is aware of options available with [facility] and [facility]FA.  Pt requesting referral to [facility].  CSW confirmed address and will mail information to Amanda [name].

## 2016-02-12 ENCOUNTER — Encounter: Payer: Self-pay | Admitting: *Deleted

## 2016-02-15 ENCOUNTER — Other Ambulatory Visit: Payer: Self-pay | Admitting: Internal Medicine

## 2016-02-15 DIAGNOSIS — H26492 Other secondary cataract, left eye: Secondary | ICD-10-CM | POA: Insufficient documentation

## 2016-02-15 DIAGNOSIS — E1142 Type 2 diabetes mellitus with diabetic polyneuropathy: Secondary | ICD-10-CM

## 2016-02-15 HISTORY — DX: Other secondary cataract, left eye: H26.492

## 2016-02-15 LAB — HM DIABETES EYE EXAM

## 2016-02-15 NOTE — Telephone Encounter (Signed)
Pt requesting cream for neuropathy to be filled.

## 2016-02-15 NOTE — Telephone Encounter (Signed)
This is an otc cream, called spoke w/ spouse he said he would tell her

## 2016-02-15 NOTE — Telephone Encounter (Signed)
Call to [facility] on  [address].  Prescription for Capsaicin-Menthol-Methyl Creme not received at Pharmacy.  Prescription was given to pharmacist with direction to apply over painful areas. Dispense 20 G tube with 3 refills.  Patient was called and informed that prescription for the creme would be ready in about an hour for pick up.  Patient voiced understanding of the plan and will get medication.  Sander Nephew, RN 02/15/2016 11:28 AM

## 2016-02-17 ENCOUNTER — Encounter: Payer: Self-pay | Admitting: *Deleted

## 2016-03-09 ENCOUNTER — Ambulatory Visit: Payer: Self-pay | Admitting: Internal Medicine

## 2016-03-10 ENCOUNTER — Ambulatory Visit: Payer: Self-pay | Admitting: Internal Medicine

## 2016-03-12 ENCOUNTER — Encounter (HOSPITAL_COMMUNITY): Payer: Self-pay

## 2016-03-12 ENCOUNTER — Emergency Department (HOSPITAL_COMMUNITY)
Admission: EM | Admit: 2016-03-12 | Discharge: 2016-03-12 | Disposition: A | Discharge status: Home / Self Care | Payer: Self-pay | Attending: Emergency Medicine | Admitting: Emergency Medicine

## 2016-03-12 DIAGNOSIS — J069 Acute upper respiratory infection, unspecified: Secondary | ICD-10-CM | POA: Insufficient documentation

## 2016-03-12 DIAGNOSIS — Z794 Long term (current) use of insulin: Secondary | ICD-10-CM | POA: Insufficient documentation

## 2016-03-12 DIAGNOSIS — R110 Nausea: Secondary | ICD-10-CM | POA: Insufficient documentation

## 2016-03-12 DIAGNOSIS — M1990 Unspecified osteoarthritis, unspecified site: Secondary | ICD-10-CM | POA: Insufficient documentation

## 2016-03-12 DIAGNOSIS — Z7982 Long term (current) use of aspirin: Secondary | ICD-10-CM | POA: Insufficient documentation

## 2016-03-12 DIAGNOSIS — H9209 Otalgia, unspecified ear: Secondary | ICD-10-CM | POA: Insufficient documentation

## 2016-03-12 DIAGNOSIS — Z7952 Long term (current) use of systemic steroids: Secondary | ICD-10-CM | POA: Insufficient documentation

## 2016-03-12 DIAGNOSIS — Z862 Personal history of diseases of the blood and blood-forming organs and certain disorders involving the immune mechanism: Secondary | ICD-10-CM | POA: Insufficient documentation

## 2016-03-12 DIAGNOSIS — E785 Hyperlipidemia, unspecified: Secondary | ICD-10-CM | POA: Insufficient documentation

## 2016-03-12 DIAGNOSIS — Z79899 Other long term (current) drug therapy: Secondary | ICD-10-CM | POA: Insufficient documentation

## 2016-03-12 DIAGNOSIS — E119 Type 2 diabetes mellitus without complications: Secondary | ICD-10-CM | POA: Insufficient documentation

## 2016-03-12 DIAGNOSIS — Z7984 Long term (current) use of oral hypoglycemic drugs: Secondary | ICD-10-CM | POA: Insufficient documentation

## 2016-03-12 DIAGNOSIS — K5900 Constipation, unspecified: Secondary | ICD-10-CM | POA: Insufficient documentation

## 2016-03-12 DIAGNOSIS — M791 Myalgia: Secondary | ICD-10-CM | POA: Insufficient documentation

## 2016-03-12 DIAGNOSIS — I10 Essential (primary) hypertension: Secondary | ICD-10-CM | POA: Insufficient documentation

## 2016-03-12 MED ORDER — BENZONATATE 100 MG PO CAPS: 100.0000 mg | ORAL_CAPSULE | Freq: Three times a day (TID) | ORAL | Status: DC

## 2016-03-12 NOTE — Discharge Instructions (Signed)
1. Medications: flonase and/or mucinex for nasal congestion, tessalon for cough, continue usual home medications Put 3 capfuls of MiraLAX into water and drink entire solution today. Then, take one capful of MiraLAX mixed with water daily until bowel movements are regular. Use over-the-counter wax softener drops in both ears for the next 3-5 days which can be found at any local pharmacy. 2. Treatment: rest, drink plenty of fluids, take tylenol or ibuprofen for fever control if needed 3. Follow Up: Please follow up with your primary doctor in 3 days for discussion of your diagnoses and further evaluation after today's visit; Return to the ER for high fevers, difficulty breathing or other concerning symptoms

## 2016-03-12 NOTE — ED Provider Notes (Signed)
CSN: [id number]     Arrival date & time 03/12/16  1434 History  By signing my name below, I, Nicole Kindred, attest that this documentation has been prepared under the direction and in the presence of No att. providers found.   Electronically Signed: Nicole Kindred, ED Scribe. 03/12/2016. 4:29 PM  Chief Complaint  Patient presents with  . Sore Throat  . Generalized Body Aches   The history is provided by the patient. No language interpreter was used.   HPI Comments: Amanda [name] is a 65 y.o. female with PMHx of DM, HTN, HLD, and anemia who presents to the Emergency Department complaining of gradual onset, constant, non-productive cough, ongoing for one week. Pt reports associated otalgia worse on the right side, abdominal pain, nausea, generalized body aches, fatigue, sore throat, constipation, and, rhinorrhea. Her last normal bowel movement was 03/09/2016. At baseline, she has a bowel movement every two days. No worsening or alleviating factors noted. Pt denies fever, vomiting, diarrhea, or any other pertinent symptoms.   Past Medical History  Diagnosis Date  . Diabetes mellitus   . Hypotension   . Influenza A (H1N1) 01/03/2013  . DKA (diabetic ketoacidoses) ([place]) 01/02/2013  . Anemia   . SYNCOPE 05/10/2010    Qualifier: Diagnosis of  By: Marilynne Halsted, RN, BSN, Jacquelyn    . Hypertension   . Hyperlipidemia   . Allergy     SEASONAL  . Arthritis   . Cataract     PRESENT IN LEFT/ CATARACTS REMOVED FROM RIGHT  . Neuromuscular disorder ([place])     NEUROPATHY   Past Surgical History  Procedure Laterality Date  . Rotator cuff repair    . Abdominal hysterectomy      10 years ago   . Shoulder surgery      Rotator cuff tear   . Cataracts     Family History  Problem Relation Age of Onset  . Diabetes Mother   . Hypertension Mother   . Aneurysm Father   . Diabetes Sister   . Hypertension Sister   . Kidney disease Sister     One Kidney transplant, one sister on HD  . Breast cancer  Brother    Social History  Substance Use Topics  . Smoking status: Never Smoker   . Smokeless tobacco: Never Used  . Alcohol Use: No   OB History    Gravida Para Term Preterm AB TAB SAB Ectopic Multiple Living   1 1 1       1      Review of Systems  Constitutional: Positive for fatigue. Negative for fever.  HENT: Positive for ear pain, rhinorrhea and sore throat.   Gastrointestinal: Positive for nausea, abdominal pain and constipation. Negative for vomiting and diarrhea.  Musculoskeletal: Positive for myalgias.    Allergies  Gabapentin  Home Medications   Prior to Admission medications   Medication Sig Start Date End Date Taking? Authorizing Provider  acetaminophen (TYLENOL) 325 MG tablet Take 2 tablets (650 mg total) by mouth every 6 (six) hours as needed (or Fever >/= 101). 01/05/13   Dorian Heckle, MD  aspirin 81 MG tablet Take 81 mg by mouth daily.    Historical Provider, MD  benzonatate (TESSALON) 100 MG capsule Take 1 capsule (100 mg total) by mouth every 8 (eight) hours. 03/12/16   Ozella Almond Alayasia Breeding, PA-C  Capsaicin-Menthol-Methyl Sal (CAPSAICIN-METHYL SAL-MENTHOL) 0.025-1-12 % CREA Apply over painful areas 02/08/16   Corky Sox, MD  fluticasone [facility]) 50 MCG/ACT nasal spray Place  2 sprays into both nostrils daily. 10/27/15 10/26/16  Corky Sox, MD  hydrochlorothiazide (HYDRODIURIL) 25 MG tablet Take 1 tablet (25 mg total) by mouth daily. 11/24/15   Loleta Chance, MD  Insulin Detemir (LEVEMIR) 100 UNIT/ML Pen Inject 6 Units into the skin daily. 07/06/15   Corky Sox, MD  lisinopril (PRINIVIL,ZESTRIL) 40 MG tablet TAKE ONE TABLET BY MOUTH ONCE DAILY 02/01/16   Corky Sox, MD  loratadine (CLARITIN) 10 MG tablet TAKE ONE TABLET BY MOUTH ONCE DAILY 12/30/15   Corky Sox, MD  metFORMIN (GLUCOPHAGE-XR) 500 MG 24 hr tablet TAKE TWO TABLETS BY MOUTH ONCE DAILY WITH  BREAKFAST 11/24/15   Loleta Chance, MD  Multiple Vitamins-Minerals (WOMENS 50+ [place] VITAMIN/MIN) TABS Take 1 tablet  by mouth daily. 03/18/14   Lucious Groves, DO  pravastatin (PRAVACHOL) 20 MG tablet TAKE ONE TABLET BY MOUTH ONCE DAILY 02/15/16   Corky Sox, MD  senna (SENOKOT) 8.6 MG TABS tablet Take 1 tablet (8.6 mg total) by mouth daily as needed for mild constipation. 09/16/14   Corky Sox, MD  sertraline (ZOLOFT) 50 MG tablet Take 1 tablet (50 mg total) by mouth daily. 02/08/16 02/07/17  Corky Sox, MD  simethicone (GAS-X) 80 MG chewable tablet Chew 1 tablet (80 mg total) by mouth 4 (four) times daily as needed for flatulence. 06/02/14 06/02/15  Corky Sox, MD   BP 146/75 mmHg  Pulse 87  Temp(Src) 97.9 F (36.6 C) (Oral)  Resp 16  SpO2 100% Physical Exam  Constitutional: She is oriented to person, place, and time. She appears well-developed and well-nourished.  HENT:  Head: Normocephalic and atraumatic.  OP clear - no erythema, exudates, or tonsillar hypertrophy. Mucus membranes moist.  + nasal congestion and mucosal rhinorrhea. No focal sinus tenderness.  Eyes: EOM are normal.  Neck: Normal range of motion. Neck supple. No tracheal deviation present.  Cardiovascular: Normal rate, regular rhythm and normal heart sounds.   Pulmonary/Chest: Effort normal and breath sounds normal. No respiratory distress. She has no wheezes. She has no rales.  Abdominal: Soft. Bowel sounds are normal. She exhibits no distension. There is no tenderness.  Musculoskeletal: Normal range of motion.  No CVA tenderness.   Lymphadenopathy:    She has no cervical adenopathy.  Neurological: She is alert and oriented to person, place, and time.  Skin: Skin is warm and dry.  Psychiatric: She has a normal mood and affect.  Nursing note and vitals reviewed.   ED Course  Procedures (including critical care time) DIAGNOSTIC STUDIES: Oxygen Saturation is 100% on RA, normal by my interpretation.    COORDINATION OF CARE: 3:26 PM-Discussed treatment plan which includes flonase, miralax, and tessalon with pt at bedside and pt  agreed to plan.   Labs Review Labs Reviewed - No data to display  Imaging Review No results found.   EKG Interpretation None      MDM   Patient with cough/congestion.  Vitals are stable, afebrile.  No signs of dehydration, tolerating PO's. OP clear.  Lungs are clear. Due to patient's presentation and physical exam a chest x-ray was not ordered. Patient will be discharged with instructions to orally hydrate, rest, and use over-the-counter medications such as anti-inflammatories ibuprofen and Aleve for muscle aches and Tylenol for fever. Tessalon given for cough. Recommend flonase OTC for nasal congestion. PCP follow up strongly encouraged. Return precautions given, all questions answered.   Patient also with complaint of constipation. On exam, non-surgical abdomen with  no TTP. Miralax instructions given. PCP follow up. Return precautions given. All questions answered.   Final diagnoses:  URI (upper respiratory infection)   I personally performed the services described in this documentation, which was scribed in my presence. The recorded information has been reviewed and is accurate.   [facility] Elvin Mccartin, PA-C 03/12/16 [place], MD 03/13/16 [phone]

## 2016-03-12 NOTE — ED Notes (Signed)
Sore throat, body aches, earache, runny nose x 1 week.  No fever

## 2016-03-18 ENCOUNTER — Ambulatory Visit: Payer: Self-pay | Admitting: Internal Medicine

## 2016-03-27 ENCOUNTER — Other Ambulatory Visit: Payer: Self-pay | Admitting: Internal Medicine

## 2016-03-30 ENCOUNTER — Ambulatory Visit: Payer: Self-pay

## 2016-03-31 ENCOUNTER — Ambulatory Visit: Payer: Self-pay

## 2016-04-01 ENCOUNTER — Ambulatory Visit: Payer: Self-pay

## 2016-04-04 ENCOUNTER — Ambulatory Visit (HOSPITAL_COMMUNITY): Payer: Self-pay | Admitting: Clinical

## 2016-04-14 ENCOUNTER — Ambulatory Visit: Payer: Self-pay

## 2016-04-19 ENCOUNTER — Telehealth: Payer: Self-pay | Admitting: Internal Medicine

## 2016-04-19 NOTE — Telephone Encounter (Signed)
APPT. REMINDER CALL, LMTCB °

## 2016-04-21 ENCOUNTER — Ambulatory Visit: Payer: Self-pay

## 2016-04-26 ENCOUNTER — Ambulatory Visit (HOSPITAL_COMMUNITY)
Admission: RE | Admit: 2016-04-26 | Discharge: 2016-04-26 | Disposition: A | Discharge status: Home / Self Care | Payer: Self-pay | Source: Ambulatory Visit | Attending: Internal Medicine | Admitting: Internal Medicine

## 2016-04-26 ENCOUNTER — Ambulatory Visit (INDEPENDENT_AMBULATORY_CARE_PROVIDER_SITE_OTHER): Payer: Self-pay | Admitting: Internal Medicine

## 2016-04-26 ENCOUNTER — Encounter: Payer: Self-pay | Admitting: Internal Medicine

## 2016-04-26 VITALS — BP 176/87 | HR 85 | Temp 98.1°F | Ht 61.5 in | Wt 155.2 lb

## 2016-04-26 DIAGNOSIS — E119 Type 2 diabetes mellitus without complications: Secondary | ICD-10-CM

## 2016-04-26 DIAGNOSIS — X58XXXA Exposure to other specified factors, initial encounter: Secondary | ICD-10-CM | POA: Insufficient documentation

## 2016-04-26 DIAGNOSIS — S99912A Unspecified injury of left ankle, initial encounter: Secondary | ICD-10-CM | POA: Insufficient documentation

## 2016-04-26 DIAGNOSIS — W19XXXA Unspecified fall, initial encounter: Secondary | ICD-10-CM

## 2016-04-26 DIAGNOSIS — R2242 Localized swelling, mass and lump, left lower limb: Secondary | ICD-10-CM | POA: Insufficient documentation

## 2016-04-26 DIAGNOSIS — S99812A Other specified injuries of left ankle, initial encounter: Secondary | ICD-10-CM

## 2016-04-26 DIAGNOSIS — I10 Essential (primary) hypertension: Secondary | ICD-10-CM

## 2016-04-26 DIAGNOSIS — Z794 Long term (current) use of insulin: Secondary | ICD-10-CM

## 2016-04-26 DIAGNOSIS — E1142 Type 2 diabetes mellitus with diabetic polyneuropathy: Secondary | ICD-10-CM

## 2016-04-26 LAB — POCT GLYCOSYLATED HEMOGLOBIN (HGB A1C): Hemoglobin A1C: 7.4

## 2016-04-26 LAB — GLUCOSE, CAPILLARY: Glucose-Capillary: 132 mg/dL — ABNORMAL HIGH (ref 65–99)

## 2016-04-26 MED ORDER — MELOXICAM 7.5 MG PO TABS
7.5000 mg | ORAL_TABLET | Freq: Every day | ORAL | Status: AC
Start: 2016-04-26 — End: 2017-04-26

## 2016-04-26 MED ORDER — SPIRONOLACTONE 25 MG PO TABS: 25.0000 mg | ORAL_TABLET | Freq: Every day | ORAL | Status: DC

## 2016-04-26 MED ORDER — DICLOFENAC SODIUM 1 % TD GEL: 4.0000 g | Freq: Four times a day (QID) | TRANSDERMAL | Status: DC

## 2016-04-26 NOTE — Patient Instructions (Signed)
Ms. Cullop,  It was a JOY taking care of you today.  For your ankle pain, we will obtain some X-rays today. You'll only get a call from [place] if they are abnormal. If you don't hear from [place], it is a good thing. Please apply Voltaren gel 4 times a day. Take Meloxicam (Mobic) once daily for 7 days. Please apply ice every 15 to 20 minutes every two to three hours until swelling is improved. Please pick up an ankle brace from the pharmacy.  For your diabetes, we are checking your A1c today. Remember, only take 6 Units of Levemir, since it sounds like you are having some low blood sugars.  Your blood pressure remains elevated. We are starting a new blood pressure medication, Spironolactone, which you will take once daily. You'll need to have your potassium checked the next visit. Please remind Dr. Gretta Cool" Ronnald Ramp that you need this done.  Have a beautiful day, and I hope your ankle feels better.

## 2016-04-26 NOTE — Assessment & Plan Note (Addendum)
A: Ankle series obtained since she met [place] criteria for ankle. Possible Navicular fracture shown in left foot. She was given instructions and prescriptions for anti-inflammatories. She was called, and I informed her of the possible fracture and sports medicine referral.  P: ice every 15 to 20 minutes every two to three hours until swelling is improved. Voltaren gel Mobic 7.5 mg daily x 7 days Ankle brace Sport medicine referral

## 2016-04-26 NOTE — Assessment & Plan Note (Signed)
A: Even though elevated BP of 176/87 may be, in part, related to pain, she has consistently had an elevated BPs over the past year without changes in her BP regimen. Therefore, we will an additional antihypertensive  P: Add Spironolactone 25 mg daily Continue HCTZ 25 mg daily + HCTZ 25 mg daily BMET and reassess BP at next visit

## 2016-04-26 NOTE — Assessment & Plan Note (Signed)
A: Patient had two episodes of symptomatic hypoglycemia since February. She has been taking more Insulin than prescribed (8U versus 6U). Educated to take 6U. A1c this visit is 7.4. It was 7.2 5 months ago.l  P:  Continue metformin 500 mg 24 hr tablet + Levemir 6U daily

## 2016-04-26 NOTE — Progress Notes (Signed)
   Subjective:    Patient ID: Amanda [name], female    DOB: 11/21/1951, 65 y.o.   MRN: [id number]  HPI  Amanda [name] is a 65 year old woman with a PMH of HTN, T2DM with nephropathy who comes to the clinic to discuss left ankle pain she's had since 5/5. She describes being leaving the bank in a hurry and not noticing that the sidewalk "was about to run out." She tripped and inverted her left ankle, falling on her left shoulder without hitting her head. She denies any shoulder pain, but says shortly thereafter, her left ankle began to swell, there was limited range of motion, and she began to limp on her left foot. These symptoms have not abated. She has used ice intermittently for no more than 5 minutes at a time. She is able to walk, but she cannot put much weight on her left foot. She has not taken any medications for it. There were no prodromal symptoms or LOC related to her fall.  With respect to her diabetes, she take 8U Levemir daily (prescribed 6U) and metformin. She reports two episodes of symptomatic hypoglycemia (light-headedness) with BGs in the 60s. She says these episodes were unrelated to her fall. She says her fasting BG in the AM is typically in the 70s , rising to the 130s after breakfast (light breakfast with fruit), and rising past the 170s later in the day after dinner.   She has been taking her blood pressure medications and tolerating them well. She denies headache and vision changes.    Review of Systems  Constitutional: Negative for activity change and fatigue.  HENT: Negative for congestion and sneezing.   Eyes: Negative for redness and itching.  Respiratory: Negative for cough and shortness of breath.   Cardiovascular: Negative for chest pain and palpitations.  Gastrointestinal: Negative for nausea and abdominal pain.  Endocrine: Negative for polydipsia, polyphagia and polyuria.  Genitourinary: Negative for dysuria and urgency.  Musculoskeletal: Positive for arthralgias  and gait problem. Negative for back pain.  Skin: Negative for rash and wound.  Neurological: Negative for syncope, light-headedness and headaches.  Psychiatric/Behavioral: Negative for dysphoric mood. The patient is not nervous/anxious.        Objective:   Physical Exam  Constitutional: She appears well-developed. No distress.  HENT:  Mouth/Throat: Oropharynx is clear and moist. No oropharyngeal exudate.  Eyes: Conjunctivae are normal. No scleral icterus.  Neck: Neck supple.  Cardiovascular: Normal rate, regular rhythm and normal heart sounds.   Pulmonary/Chest: Effort normal and breath sounds normal. No respiratory distress. She has no wheezes.  Abdominal: Soft. Bowel sounds are normal. She exhibits no distension. There is no tenderness.  Musculoskeletal:  Left foot: TTP in malleolar zone, distal fibular, 5th metatarsal. Limited range of motion of left foot. Antalgic gait.  Lymphadenopathy:    She has no cervical adenopathy.  Neurological: She is alert. She has normal reflexes.  Skin: Skin is warm and dry. No rash noted.  Psychiatric: She has a normal mood and affect. Her behavior is normal.  Vitals reviewed.         Assessment & Plan:   Please see problem based assessment and plan for details.

## 2016-04-27 ENCOUNTER — Telehealth: Payer: Self-pay

## 2016-04-28 NOTE — Progress Notes (Signed)
Case discussed with Dr. Marijean Bravo at the time of the visit.  We reviewed the resident's history and exam and pertinent patient test results.  I agree with the assessment, diagnosis, and plan of care documented in the resident's note.  If she is a type 2 diabetic with low insulin requirements and continued hypoglycemic episodes on insulin she may be a candidate for oral therapy alone with increased metformin doses and alternative therapy including some of the newer agents assuming they would be covered by her insurance until she reaches Medicare age.

## 2016-05-04 ENCOUNTER — Encounter: Payer: Self-pay | Admitting: Internal Medicine

## 2016-05-04 ENCOUNTER — Ambulatory Visit (INDEPENDENT_AMBULATORY_CARE_PROVIDER_SITE_OTHER): Payer: Self-pay | Admitting: Internal Medicine

## 2016-05-04 VITALS — BP 161/74 | HR 85 | Temp 98.2°F | Ht 61.5 in | Wt 155.3 lb

## 2016-05-04 DIAGNOSIS — M8580 Other specified disorders of bone density and structure, unspecified site: Secondary | ICD-10-CM | POA: Insufficient documentation

## 2016-05-04 DIAGNOSIS — E11319 Type 2 diabetes mellitus with unspecified diabetic retinopathy without macular edema: Secondary | ICD-10-CM

## 2016-05-04 DIAGNOSIS — I10 Essential (primary) hypertension: Secondary | ICD-10-CM

## 2016-05-04 DIAGNOSIS — F331 Major depressive disorder, recurrent, moderate: Secondary | ICD-10-CM

## 2016-05-04 DIAGNOSIS — E2839 Other primary ovarian failure: Secondary | ICD-10-CM

## 2016-05-04 DIAGNOSIS — Z0000 Encounter for general adult medical examination without abnormal findings: Secondary | ICD-10-CM

## 2016-05-04 DIAGNOSIS — S99912A Unspecified injury of left ankle, initial encounter: Secondary | ICD-10-CM

## 2016-05-04 DIAGNOSIS — X58XXXD Exposure to other specified factors, subsequent encounter: Secondary | ICD-10-CM

## 2016-05-04 DIAGNOSIS — S99912D Unspecified injury of left ankle, subsequent encounter: Secondary | ICD-10-CM

## 2016-05-04 HISTORY — DX: Other specified disorders of bone density and structure, unspecified site: M85.80

## 2016-05-04 LAB — GLUCOSE, CAPILLARY: Glucose-Capillary: 104 mg/dL — ABNORMAL HIGH (ref 65–99)

## 2016-05-04 MED ORDER — SERTRALINE HCL 50 MG PO TABS: 50.0000 mg | ORAL_TABLET | Freq: Every day | ORAL | Status: DC

## 2016-05-04 MED ORDER — AMLODIPINE BESYLATE 5 MG PO TABS
5.0000 mg | ORAL_TABLET | Freq: Every day | ORAL | Status: DC
Start: 2016-05-04 — End: 2016-07-04

## 2016-05-04 NOTE — Patient Instructions (Signed)
1. Please schedule a follow up appointment for 6 weeks.   2. Please take all medications as previously prescribed with the following changes:  PLEASE START NORVASC 5 MG daily for blood pressure.   PLEASE START ZOLOFT 50 MG daily for depression.   Please follow up with Sports Medicine for your foot.   We will arrange for a bone density test for you.   3. If you have worsening of your symptoms or new symptoms arise, please call the clinic [phone]), or go to the ER immediately if symptoms are severe.

## 2016-05-04 NOTE — Progress Notes (Signed)
Subjective:   Patient ID: Amanda [name] female   DOB: 1951-03-31 65 y.o.   MRN: [id number]  HPI: Amanda [name] is a 65 y.o. female w/ PMHx of DM type II (w/ neuropathy), HTN, and dyslipidemia, presents to the clinic today for a follow up for DM and HTN. Patient has been doing well recently, had a left foot fracture (navicular) for which she was seen in the clinic in the past couple weeks. Says her foot is much better, decreased swelling and pain. She has been wearing a brace. Is scheduled to see Sports Medicine this week for further management. Was supposed to have a bone density test as well a few months ago, but has not had this.   BP is slightly elevated today, has been taking HCTZ and Lisinopril. Was supposed to start taking Spironolacone at her last visit but says she did not know she was supposoed to take this. Also admits to continued symptoms of mild to moderate depression and has not started taking Zoloft which was prescribed at a previous visit. Says she is interested to pursuing this again.   No other complaints.    Current Outpatient Prescriptions  Medication Sig Dispense Refill  . acetaminophen (TYLENOL) 325 MG tablet Take 2 tablets (650 mg total) by mouth every 6 (six) hours as needed (or Fever >/= 101). 60 tablet 0  . aspirin 81 MG tablet Take 81 mg by mouth daily.    . benzonatate (TESSALON) 100 MG capsule Take 1 capsule (100 mg total) by mouth every 8 (eight) hours. 21 capsule 0  . Capsaicin-Menthol-Methyl Sal (CAPSAICIN-METHYL SAL-MENTHOL) 0.025-1-12 % CREA Apply over painful areas 1 Tube 3  . diclofenac sodium (VOLTAREN) 1 % GEL Apply 4 g topically 4 (four) times daily. 1 Tube 0  . fluticasone (FLONASE) 50 MCG/ACT nasal spray Place 2 sprays into both nostrils daily. 16 g 2  . hydrochlorothiazide (HYDRODIURIL) 25 MG tablet Take 1 tablet (25 mg total) by mouth daily. 30 tablet 5  . Insulin Detemir (LEVEMIR) 100 UNIT/ML Pen Inject 6 Units into the skin daily. 45 mL 4  .  lisinopril (PRINIVIL,ZESTRIL) 40 MG tablet TAKE ONE TABLET BY MOUTH ONCE DAILY 30 tablet 5  . loratadine (CLARITIN) 10 MG tablet TAKE ONE TABLET BY MOUTH ONCE DAILY 30 tablet 0  . meloxicam (MOBIC) 7.5 MG tablet Take 1 tablet (7.5 mg total) by mouth daily. 7 tablet 0  . metFORMIN (GLUCOPHAGE-XR) 500 MG 24 hr tablet TAKE TWO TABLETS BY MOUTH ONCE DAILY WITH  BREAKFAST 60 tablet 5  . Multiple Vitamins-Minerals (WOMENS 50+ MULTI VITAMIN/MIN) TABS Take 1 tablet by mouth daily. 30 tablet   . pravastatin (PRAVACHOL) 20 MG tablet TAKE ONE TABLET BY MOUTH ONCE DAILY 30 tablet 5  . senna (SENOKOT) 8.6 MG TABS tablet Take 1 tablet (8.6 mg total) by mouth daily as needed for mild constipation. 120 each 0  . sertraline (ZOLOFT) 50 MG tablet Take 1 tablet (50 mg total) by mouth daily. 30 tablet 2  . simethicone (GAS-X) 80 MG chewable tablet Chew 1 tablet (80 mg total) by mouth 4 (four) times daily as needed for flatulence. 100 tablet 2  . spironolactone (ALDACTONE) 25 MG tablet Take 1 tablet (25 mg total) by mouth daily. 30 tablet 11   No current facility-administered medications for this visit.   Review of Systems  General: Denies fever, diaphoresis, appetite change, and fatigue.  Respiratory: Denies SOB, cough, and wheezing.   Cardiovascular: Denies chest pain and  palpitations.  Gastrointestinal: Denies nausea, vomiting, abdominal pain, and diarrhea Musculoskeletal: Positive for left foot pain. Denies myalgias, back pain, and gait problem.  Neurological: Positive for decreased vision in the right eye. Denies dizziness, syncope, weakness, lightheadedness, and headaches.  Psychiatric/Behavioral: Positive for mild depression. Denies sleep disturbance, and agitation.    Objective:   Physical Exam: Filed Vitals:   05/04/16 1400  BP: 161/74  Pulse: 85  Temp: 98.2 F (36.8 C)  TempSrc: Oral  Height: 5' 1.5" (1.562 m)  Weight: 155 lb 4.8 oz (70.444 kg)  SpO2: 100%    General: AA female, alert,  cooperative, NAD.  HEENT: PERRL, EOMI. Moist mucus membranes.  Neck: Full range of motion without pain, supple, no lymphadenopathy or carotid bruits Lungs: Clear to ascultation bilaterally, normal work of respiration, no wheezes, rales, rhonchi Heart: RRR, no murmurs, gallops, or rubs Abdomen: Soft, non-tender, non-distended, BS + Extremities: No cyanosis, clubbing, or edema. Left foot with mild swelling over the proximal portion of the dorsum of the foot. No obvious tenderness. No ecchymosis or erythema.  Neurologic: Alert & oriented x3, cranial nerves II-XII intact, strength grossly intact, sensation intact to light touch   Assessment & Plan:   Please see problem based assessment and plan.

## 2016-05-05 NOTE — Progress Notes (Signed)
Internal Medicine Clinic Attending  Case discussed with Dr. Jones at the time of the visit.  We reviewed the resident's history and exam and pertinent patient test results.  I agree with the assessment, diagnosis, and plan of care documented in the resident's note.  

## 2016-05-05 NOTE — Assessment & Plan Note (Signed)
Patient states she had recent acute onset decreased vision in her right eye. Went to her ophthalmologist at [facility] and states that she apparently had a small bleed in her eye, according to the patient. No retinal detachment. Is scheduled for a Vitrectomy on 05/12/16 according to the patient.

## 2016-05-05 NOTE — Assessment & Plan Note (Signed)
Suspected left navicular fracture. Seems to be doing well, swelling still present but significantly improved.  -Schedule to see Sports Medicine this week for possible further management

## 2016-05-05 NOTE — Assessment & Plan Note (Signed)
Have discussed SSRI's with patient multiple times, prescribed Zoloft previously, however, she has not started this medication. Still says she is interested in starting this given her continued symptoms of mild to moderate depression. -Start Zoloft 50 mg daily

## 2016-05-05 NOTE — Assessment & Plan Note (Signed)
Have been attempting to have patient receive bone density for some time, however, this has not been arranged. Think this is especially important given recent navicular fracture.  -Bone density scheduled

## 2016-05-05 NOTE — Assessment & Plan Note (Signed)
Please see estrogen deficiency section regarding bone density

## 2016-05-05 NOTE — Assessment & Plan Note (Signed)
BP Readings from Last 3 Encounters:  05/04/16 161/74  04/26/16 176/87  03/12/16 146/75    Lab Results  Component Value Date   NA 143 06/16/2015   K 4.2 06/16/2015   CREATININE 0.97 06/16/2015    Assessment: Blood pressure control:  Elevated Progress toward BP goal:   Not at goal Comments: Taking HCTZ 25 mg daily + Lisinopril 40 mg daily. Was previously tried on Norvasc several months back but never picked up the medication. Her BP was then somewhat better controlled and this was not pursued further. Then at her last visit, she was started on Spironolactone by another provider (no apparent hypokalemia or hyperaldo, no CHF), but the patient did not pick this medication up either.   Plan: Medications:  Continue Lisinopril 40 mg daily + HCTZ 25 mg daily. START NORVASC 5 mg daily. Cancelled Rx for Spironolactone.  Other plans: RTC in 6 weeks for follow up

## 2016-05-06 ENCOUNTER — Ambulatory Visit (INDEPENDENT_AMBULATORY_CARE_PROVIDER_SITE_OTHER): Payer: Self-pay | Admitting: Family Medicine

## 2016-05-06 ENCOUNTER — Encounter: Payer: Self-pay | Admitting: Family Medicine

## 2016-05-06 VITALS — BP 140/80 | Ht 61.5 in | Wt 155.0 lb

## 2016-05-06 DIAGNOSIS — S99912A Unspecified injury of left ankle, initial encounter: Secondary | ICD-10-CM

## 2016-05-06 NOTE — Progress Notes (Signed)
Amanda [name] - 65 y.o. female MRN [id number]  Date of birth: 12-12-1951  CC: Left ankle pain  SUBJECTIVE:   HPI Amanda [name] is a very pleasant 65 year old female who reports with left ankle pain following an inversion injury while walking about 2 weeks ago. She had an x-ray completed on 04/26/2016 showing a possible avulsion injury. Since that time she's been using an Ace wrap and elevating it. She is also taking pain medications intermittently with minimal relief. She feels significantly better all she does still have some swelling and pain around the ankle. She is diabetic and has diabetic neuropathy. She is able to bear weight at the time and is able to walk now with mild discomfort throughout her ankle.   ROS:     As above no fevers chills or night sweats. No other joint swelling.  HISTORY: Past Medical, Surgical, Social, and Family History Reviewed & Updated per EMR.  Pertinent Historical Findings include: Diabetes and diabetic neuropathy with recent A1c of 7.4, hypertension, depression  Data review: Three-view of the left ankle reveals possible avulsion fracture from the navicular. We did review these images directly and agree that this is a possible avulsion.  OBJECTIVE: BP 140/80 mmHg  Ht 5' 1.5" (1.562 m)  Wt 155 lb (70.308 kg)  BMI 28.82 kg/m2  Physical Exam Calm, no acute distress No shortness of breath  Ankle: left Generalized dorsal swelling extending to the anterior lateral malleolus. No bruising.  Range of motion is full in all directions. Strength is 4+/5 in all directions. Stable lateral and medial ligaments; squeeze test and kleiger test unremarkable; Talar dome nontender; No pain at base of 5th MT; tenderness over the cuboid and navicular near the region of the tibialis anterior insertion. No specific N spot tenderness. Mild generalized tenderness overlying the malleoli both anteriorly and posteriorly. No sign of peroneal tendon subluxations; Negative tarsal  tunnel tinel's Able to walk 4 steps with minimal discomfort.  Ultrasound: Long and short axis views of the dorsal medial left foot reveal no obvious bony evulsion although there is increased hypoechoic signal extending out from the chemotherapy navicular joint, which does correlate to her pain. The tibialis anterior is a uniform fibrillar structure with no surrounding hypoechoic fluid or irregularity.  MEDICATIONS, LABS & OTHER ORDERS: Previous Medications   ACETAMINOPHEN (TYLENOL) 325 MG TABLET    Take 2 tablets (650 mg total) by mouth every 6 (six) hours as needed (or Fever >/= 101).   AMLODIPINE (NORVASC) 5 MG TABLET    Take 1 tablet (5 mg total) by mouth daily.   ASPIRIN 81 MG TABLET    Take 81 mg by mouth daily.   BENZONATATE (TESSALON) 100 MG CAPSULE    Take 1 capsule (100 mg total) by mouth every 8 (eight) hours.   CAPSAICIN-MENTHOL-METHYL SAL (CAPSAICIN-METHYL SAL-MENTHOL) 0.025-1-12 % CREA    Apply over painful areas   DICLOFENAC SODIUM (VOLTAREN) 1 % GEL    Apply 4 g topically 4 (four) times daily.   FLUTICASONE (FLONASE) 50 MCG/ACT NASAL SPRAY    Place 2 sprays into both nostrils daily.   HYDROCHLOROTHIAZIDE (HYDRODIURIL) 25 MG TABLET    Take 1 tablet (25 mg total) by mouth daily.   INSULIN DETEMIR (LEVEMIR) 100 UNIT/ML PEN    Inject 6 Units into the skin daily.   LISINOPRIL (PRINIVIL,ZESTRIL) 40 MG TABLET    TAKE ONE TABLET BY MOUTH ONCE DAILY   LORATADINE (CLARITIN) 10 MG TABLET    TAKE ONE TABLET BY MOUTH  ONCE DAILY   MELOXICAM (MOBIC) 7.5 MG TABLET    Take 1 tablet (7.5 mg total) by mouth daily.   METFORMIN (GLUCOPHAGE-XR) 500 MG 24 HR TABLET    TAKE TWO TABLETS BY MOUTH ONCE DAILY WITH  BREAKFAST   MULTIPLE VITAMINS-MINERALS (WOMENS 50+ MULTI VITAMIN/MIN) TABS    Take 1 tablet by mouth daily.   PRAVASTATIN (PRAVACHOL) 20 MG TABLET    TAKE ONE TABLET BY MOUTH ONCE DAILY   SENNA (SENOKOT) 8.6 MG TABS TABLET    Take 1 tablet (8.6 mg total) by mouth daily as needed for mild  constipation.   SERTRALINE (ZOLOFT) 50 MG TABLET    Take 1 tablet (50 mg total) by mouth daily.   SIMETHICONE (GAS-X) 80 MG CHEWABLE TABLET    Chew 1 tablet (80 mg total) by mouth 4 (four) times daily as needed for flatulence.   Modified Medications   No medications on file   New Prescriptions   No medications on file   Discontinued Medications   No medications on file  No orders of the defined types were placed in this encounter.   ASSESSMENT & PLAN: Amanda [name] presents with a left ankle injury roughly 2 weeks ago with a questionable x-ray showing a navicular avulsion. She has made significant improvement in the last couple weeks and has minimal pain weightbearing. She's not been wearing any protective brace. Unfortunately we do not have a boot to provide her today but have given her an ASO and we are ordering a postop shoe which she'll wear together until we see her back in 2 weeks. 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 We're being more cautious with her considering her diabetic neuropathy. She will work on active range of motion of the ankle in the next 2 weeks without resistance. She can continue using diclofenac gel as needed and wrapping the ankle to help with swelling. Call with any questions or concerns in the interim.

## 2016-05-06 NOTE — Progress Notes (Signed)
Patient ID: Amanda [name], female   DOB: 1951/10/18, 65 y.o.   MRN: [id number] [facility]: Attending Note: I have reviewed the chart, discussed wit the Sports Medicine Fellow. I agree with assessment and treatment plan as detailed in the [place] note. The [place] images with color doppler showed a moderate amount of increased vascular activity indicating to me this is an active area, acute injury. Her symptoms are less than I might expect but she also has peripheral neuropathy so that may be masking what would otherwise be greater pain. Will recommend cam walker boot (short) to immobilize 2 weeks, rtc and then likely progress to brace and start proprioceptive rehab.

## 2016-05-10 ENCOUNTER — Other Ambulatory Visit: Payer: Self-pay | Admitting: Internal Medicine

## 2016-05-13 DIAGNOSIS — H4312 Vitreous hemorrhage, left eye: Secondary | ICD-10-CM | POA: Insufficient documentation

## 2016-05-13 HISTORY — DX: Vitreous hemorrhage, left eye: H43.12

## 2016-05-20 ENCOUNTER — Inpatient Hospital Stay: Admission: RE | Admit: 2016-05-20 | Payer: Self-pay | Source: Ambulatory Visit

## 2016-05-30 ENCOUNTER — Encounter: Payer: Self-pay | Admitting: *Deleted

## 2016-06-01 ENCOUNTER — Ambulatory Visit
Admission: RE | Admit: 2016-06-01 | Discharge: 2016-06-01 | Disposition: A | Discharge status: Home / Self Care | Payer: No Typology Code available for payment source | Source: Ambulatory Visit | Attending: Internal Medicine | Admitting: Internal Medicine

## 2016-06-01 DIAGNOSIS — E2839 Other primary ovarian failure: Secondary | ICD-10-CM

## 2016-06-01 DIAGNOSIS — S99912A Unspecified injury of left ankle, initial encounter: Secondary | ICD-10-CM

## 2016-06-01 DIAGNOSIS — Z0000 Encounter for general adult medical examination without abnormal findings: Secondary | ICD-10-CM

## 2016-06-03 ENCOUNTER — Ambulatory Visit (INDEPENDENT_AMBULATORY_CARE_PROVIDER_SITE_OTHER): Payer: Self-pay | Admitting: Family Medicine

## 2016-06-03 ENCOUNTER — Encounter: Payer: Self-pay | Admitting: Family Medicine

## 2016-06-03 VITALS — BP 182/75 | HR 86 | Ht 61.5 in | Wt 155.0 lb

## 2016-06-03 DIAGNOSIS — M25579 Pain in unspecified ankle and joints of unspecified foot: Secondary | ICD-10-CM

## 2016-06-03 NOTE — Progress Notes (Signed)
.sp  Amanda [name] - 65 y.o. female MRN [id number]  Date of birth: [birth year]  CC: Left ankle pain  SUBJECTIVE:   HPI 05/06/2016: Amanda [name] is a very pleasant 65 year old female who reports with left ankle pain following an inversion injury while walking about 2 weeks ago. Amanda [name] had an x-ray completed on 04/26/2016 showing a possible avulsion injury. Since that time Amanda [name]'s been using an Ace wrap and elevating it. Amanda [name] is also taking pain medications intermittently with minimal relief. Amanda [name] feels significantly better all Amanda [name] does still have some swelling and pain around the ankle. Amanda [name] is diabetic and has diabetic neuropathy. Amanda [name] is able to bear weight at the time and is able to walk now with mild discomfort throughout her ankle.   Today: Amanda [name] is Amanda [name] is doing better. At last month's visit Amanda [name] received a boot which Amanda [name] is wearing 60% of the time. Believes her previous ankle pain has resolved and Amanda [name] now has mild pain on the plantar aspect of her medial foot. Amanda [name] is unsure of the biggest causes pain. No swelling. No pain medications. He is able to walk without significant discomfort. No new injuries.  ROS:     As above no fevers chills or night sweats. No other joint swelling.  HISTORY: Past Medical, Surgical, Social, and Family History Reviewed & Updated per EMR.  Pertinent Historical Findings include: Diabetes and diabetic neuropathy with recent A1c of 7.4, hypertension, depression  Data review: Three-view of the left ankle reveals possible avulsion fracture from the navicular. We did review these images directly and agree that this is a possible avulsion.  OBJECTIVE: BP 182/75 mmHg  Pulse 86  Ht 5' 1.5" (1.562 m)  Wt 155 lb (70.308 kg)  BMI 28.82 kg/m2  Physical Exam Calm, no acute distress No shortness of breath  Ankle: left No swelling or bruising. Mild tenderness to palpation over the plantar aspect of the mid longitudinal arch Range of motion is full in all directions. Strength is 5/5  in all directions with minimal discomfort in all directions except for plantarflexion. Stable lateral and medial ligaments; squeeze test and kleiger test unremarkable; Talar dome nontender; No pain at base of 5th MT; no longer tender over the insertion of the tibialis anterior. No specific N spot tenderness. Mild generalized tenderness overlying the malleoli both anteriorly and posteriorly. No sign of peroneal tendon subluxations; Negative tarsal tunnel tinel's Able to walk 4 steps with minimal discomfort.  Ultrasound: None today  MEDICATIONS, LABS & OTHER ORDERS: Previous Medications   ACETAMINOPHEN (TYLENOL) 325 MG TABLET    Take 2 tablets (650 mg total) by mouth every 6 (six) hours as needed (or Fever >/= 101).   AMLODIPINE (NORVASC) 5 MG TABLET    Take 1 tablet (5 mg total) by mouth daily.   ASPIRIN 81 MG TABLET    Take 81 mg by mouth daily.   BENZONATATE (TESSALON) 100 MG CAPSULE    Take 1 capsule (100 mg total) by mouth every 8 (eight) hours.   CAPSAICIN-MENTHOL-METHYL SAL (CAPSAICIN-METHYL SAL-MENTHOL) 0.025-1-12 % CREA    Apply over painful areas   DICLOFENAC SODIUM (VOLTAREN) 1 % GEL    Apply 4 g topically 4 (four) times daily.   FLUTICASONE (FLONASE) 50 MCG/ACT NASAL SPRAY    Place 2 sprays into both nostrils daily.   HYDROCHLOROTHIAZIDE (HYDRODIURIL) 25 MG TABLET    Take 1 tablet (25 mg total) by mouth daily.   INSULIN DETEMIR (LEVEMIR) 100 UNIT/ML PEN    Inject 6  Units into the skin daily.   LISINOPRIL (PRINIVIL,ZESTRIL) 40 MG TABLET    TAKE ONE TABLET BY MOUTH ONCE DAILY   LORATADINE (CLARITIN) 10 MG TABLET    TAKE ONE TABLET BY MOUTH ONCE DAILY   MELOXICAM (MOBIC) 7.5 MG TABLET    Take 1 tablet (7.5 mg total) by mouth daily.   METFORMIN (GLUCOPHAGE-XR) 500 MG 24 HR TABLET    TAKE TWO TABLETS BY MOUTH ONCE DAILY WITH  BREAKFAST   MULTIPLE VITAMINS-MINERALS (WOMENS 50+ MULTI VITAMIN/MIN) TABS    Take 1 tablet by mouth daily.   PRAVASTATIN (PRAVACHOL) 20 MG TABLET    TAKE ONE  TABLET BY MOUTH ONCE DAILY   SENNA (SENOKOT) 8.6 MG TABS TABLET    Take 1 tablet (8.6 mg total) by mouth daily as needed for mild constipation.   SERTRALINE (ZOLOFT) 50 MG TABLET    Take 1 tablet (50 mg total) by mouth daily.   SIMETHICONE (GAS-X) 80 MG CHEWABLE TABLET    Chew 1 tablet (80 mg total) by mouth 4 (four) times daily as needed for flatulence.   Modified Medications   No medications on file   New Prescriptions   No medications on file   Discontinued Medications   No medications on file  No orders of the defined types were placed in this encounter.   ASSESSMENT & PLAN: Amanda [name] presents with a left ankle injury roughly 6 weeks ago with a questionable x-ray showing a navicular avulsion: Amanda [name] has been in a boot for the majority of the time since her last visit. Her previous pain has resolved on the dorsal aspect of the foot. As stated last time our concern for a actual avulsion fractures relatively low but we were extra careful considering her diabetes and peripheral neuropathy. Today her previous pain is gone. Amanda [name] has plantar arch pain likely exacerbated by wearing the boot. We provided her with an ASO today. We also added scaphoid pads to her shoes. The insoles in her shoes are not removable. Amanda [name] may be a candidate for orthotics in the future. We'll see her back in 3-4 weeks and Amanda [name] may transition out of ASO over the final 2 weeks prior to her visit. Amanda [name] should call with any questions or concerns. Amanda [name] should continue checking her feet daily considering her diabetes. Call with any questions or concerns.

## 2016-06-07 ENCOUNTER — Telehealth: Payer: Self-pay | Admitting: Pharmacist

## 2016-06-07 DIAGNOSIS — E1142 Type 2 diabetes mellitus with diabetic polyneuropathy: Secondary | ICD-10-CM

## 2016-06-07 DIAGNOSIS — E785 Hyperlipidemia, unspecified: Secondary | ICD-10-CM

## 2016-06-07 DIAGNOSIS — Z794 Long term (current) use of insulin: Secondary | ICD-10-CM

## 2016-06-07 DIAGNOSIS — I10 Essential (primary) hypertension: Secondary | ICD-10-CM

## 2016-06-07 DIAGNOSIS — J302 Other seasonal allergic rhinitis: Secondary | ICD-10-CM

## 2016-06-07 MED ORDER — ATORVASTATIN CALCIUM 10 MG PO TABS: 10.0000 mg | ORAL_TABLET | Freq: Every day | ORAL | Status: DC

## 2016-06-07 MED ORDER — AMLODIPINE BESYLATE 5 MG PO TABS
5.0000 mg | ORAL_TABLET | Freq: Every day | ORAL | Status: DC
Start: 2016-06-07 — End: 2016-06-07

## 2016-06-07 MED ORDER — LISINOPRIL 40 MG PO TABS: 40.0000 mg | ORAL_TABLET | Freq: Every day | ORAL | Status: DC

## 2016-06-07 MED ORDER — LORATADINE 10 MG PO TABS: 10.0000 mg | ORAL_TABLET | Freq: Every day | ORAL | Status: DC

## 2016-06-07 MED ORDER — METFORMIN HCL ER 500 MG PO TB24: 1000.0000 mg | ORAL_TABLET | Freq: Every day | ORAL | Status: DC

## 2016-06-07 MED FILL — ATORVASTATIN 10 MG TABLET: 10 | 30 days supply | Qty: 30 | Fill #0

## 2016-06-07 MED FILL — AMLODIPINE BESYLATE 5 MG TA: 5 | 30 days supply | Qty: 30 | Fill #0

## 2016-06-07 MED FILL — LORATADINE 10 MG TABLET: 10 | 30 days supply | Qty: 30 | Fill #0

## 2016-06-07 MED FILL — LISINOPRIL 40 MG TABLET: 40 | 30 days supply | Qty: 30 | Fill #0

## 2016-06-07 MED FILL — METFORMIN HCL ER 500 MG TAB: 500 | 30 days supply | Qty: 60 | Fill #0

## 2016-06-07 NOTE — Telephone Encounter (Signed)
Working with patient and PCP for medication access. Patient referred to financial counselor and one-time prescriptions sent to [facility] outpatient pharmacy. Patient requested appointment 06/09/16 for medication help, appointment scheduled.

## 2016-06-09 ENCOUNTER — Ambulatory Visit: Payer: No Typology Code available for payment source | Admitting: Pharmacist

## 2016-06-09 DIAGNOSIS — Z79899 Other long term (current) drug therapy: Secondary | ICD-10-CM

## 2016-06-09 NOTE — Progress Notes (Signed)
Patient ID: Amanda [name], female   DOB: December 15, 1951, 65 y.o.   MRN: [id number] All medications were reviewed with the patient, including name, instructions, indication, goals of therapy, potential side effects, importance of adherence, and safe use.  Patient reports adherence challenges regarding affordability. Helped patient apply for [facility] to receive free medications.  Patient verbalized understanding by repeating back information and was advised to contact me if further medication-related questions arise. Patient was also provided an information handout.  Collaborated with outpatient pharmacy to obtain supply of patient's medications.  Medication Samples have been provided to the patient.  Drug name: Levemir       Strength: 100u/ml        Qty: 1 pen  LOT: [id number]  Exp.Date: 06/2017  Dosing instructions: Inject 6 units into the skin once a day.  The patient has been instructed regarding the correct time, dose, and frequency of taking this medication, including desired effects and most common side effects.   Amanda [name] 12:26 PM 06/09/2016

## 2016-06-09 NOTE — Progress Notes (Signed)
Patient was seen in clinic by Juanell Fairly, [name] candidate. I agree with the assessment and plan of care documented.

## 2016-06-15 ENCOUNTER — Ambulatory Visit: Payer: No Typology Code available for payment source | Admitting: Pharmacist

## 2016-06-15 ENCOUNTER — Ambulatory Visit (INDEPENDENT_AMBULATORY_CARE_PROVIDER_SITE_OTHER): Payer: No Typology Code available for payment source | Admitting: Internal Medicine

## 2016-06-15 ENCOUNTER — Encounter: Payer: Self-pay | Admitting: Internal Medicine

## 2016-06-15 DIAGNOSIS — K0889 Other specified disorders of teeth and supporting structures: Secondary | ICD-10-CM

## 2016-06-15 DIAGNOSIS — F331 Major depressive disorder, recurrent, moderate: Secondary | ICD-10-CM

## 2016-06-15 DIAGNOSIS — S025XXA Fracture of tooth (traumatic), initial encounter for closed fracture: Secondary | ICD-10-CM

## 2016-06-15 DIAGNOSIS — I10 Essential (primary) hypertension: Secondary | ICD-10-CM

## 2016-06-15 MED ORDER — SERTRALINE HCL 50 MG PO TABS: 50.0000 mg | ORAL_TABLET | Freq: Every day | ORAL | Status: DC

## 2016-06-15 MED FILL — SERTRALINE HCL 50 MG TABLET: 50 | 30 days supply | Qty: 30 | Fill #0

## 2016-06-15 NOTE — Patient Instructions (Signed)
General Instructions: - Your blood pressure is excellent! Continue the Lisinopril, Amlodipine, and HCTZ - Start Sertraline 50 mg daily. This will help with your depression symptoms. - Will have you follow up in 6 weeks to see how your depression is doing  - Referral to Dentistry made. Their office will contact you for an appointment.  Thank you for bringing your medicines today. This helps [place] keep you safe from mistakes.   Progress Toward Treatment Goals:  Treatment Goal 10/27/2015  Hemoglobin A1C at goal  Blood pressure at goal    Self Care Goals & Plans:  Self Care Goal 06/15/2016  Manage my medications take my medicines as prescribed; bring my medications to every visit; refill my medications on time  Monitor my health keep track of my blood pressure; bring my glucose meter and log to each visit; keep track of my blood glucose  Eat healthy foods eat more vegetables; eat foods that are low in salt; eat baked foods instead of fried foods  Be physically active find an activity I enjoy  Prevent falls wear appropriate shoes  Meeting treatment goals -    Home Blood Glucose Monitoring 10/27/2015  Check my blood sugar 2 times a day  When to check my blood sugar before breakfast; at bedtime     Care Management & Community Referrals:  Referral 10/27/2015  Referrals made for care management support none needed  Referrals made to community resources none

## 2016-06-15 NOTE — Progress Notes (Signed)
Physician pharmacist co-visit

## 2016-06-15 NOTE — Progress Notes (Signed)
   Subjective:    Patient ID: Amanda [name], female    DOB: 1951-10-19, 65 y.o.   MRN: [id number]  HPI Amanda [name] is a 65yo woman with PMHx of HTN, type 2 DM, and hyperlipidemia who presents today for follow up of her hypertension.  HTN: BP 127/67 today. She is taking Lisinopril 40 mg daily, HCTZ 25 mg daily, and Norvasc 5 mg daily. Norvasc was added at her last visit.   Depression: Her PHQ 9 has gone from 6 to 1 today. However, she reports feeling depressed, difficulty concentrating, trouble sleeping, and feeling fatigued. She denies any SI/HI. She was prescribed Zoloft at her last visit but has not started this medication. She reports having to make some difficult decisions soon, but sees change in a positive light. She thinks she would benefit from starting an antidepressant.   Tooth Chipping: Reports she needs to see a dentist as she has tooth chipping on her upper left side. She notes she has changed the way she is chewing to avoid more chipping but this has caused left jaw pain.    Review of Systems General: Denies fever, chills, night sweats, changes in weight, changes in appetite HEENT: Denies headaches, ear pain, changes in vision, rhinorrhea, sore throat CV: Denies CP, palpitations, SOB, orthopnea Pulm: Denies SOB, cough, wheezing GI: Denies abdominal pain, nausea, vomiting, diarrhea, constipation, melena, hematochezia GU: Denies dysuria, hematuria, frequency Msk: Denies muscle cramps, joint pains Neuro: Denies weakness, numbness, tingling Skin: Denies rashes, bruising Psych: Denies anxiety, hallucinations    Objective:   Physical Exam General: alert, sitting up, NAD HEENT: [place]/AT, EOMI, sclera anicteric, mucus membranes moist. There are several teeth missing on the bottom left side. She has several chipped teeth on the upper left side.  CV: RRR, no m/g/r Pulm: CTA bilaterally, breaths non-labored Abd: BS+, soft, non-tender Ext: warm, no peripheral edema Neuro: alert and  oriented x 3     Assessment & Plan:  Please refer to A&P documentation.

## 2016-06-16 DIAGNOSIS — S025XXA Fracture of tooth (traumatic), initial encounter for closed fracture: Secondary | ICD-10-CM | POA: Insufficient documentation

## 2016-06-16 NOTE — Progress Notes (Signed)
Medicine attending: Medical history, presenting problems, physical findings, and medications, reviewed with resident physician Dr Carly Rivet on the day of the patient visit and I concur with her evaluation and management plan. 

## 2016-06-16 NOTE — Assessment & Plan Note (Signed)
Despite her PHQ9 being low, she still has symptoms of clinical depression. She would like to try an antidepressant and I think this is appropriate for her symptoms. Will start Zoloft 50 mg daily. We discussed that this medication will take several weeks to take effect and that she may need uptitration of the dose. We also discussed potential side effects. Will have her follow up in 6 weeks to reassess her symptoms.

## 2016-06-16 NOTE — Assessment & Plan Note (Signed)
BP well controlled with addition of Amlodipine. Continue Lisinopril 40 mg daily, HCTZ 25 mg daily, and Norvasc 5 mg daily.

## 2016-06-16 NOTE — Assessment & Plan Note (Signed)
Referral to Dentistry made.

## 2016-07-01 ENCOUNTER — Encounter: Payer: Self-pay | Admitting: Family Medicine

## 2016-07-01 ENCOUNTER — Ambulatory Visit (INDEPENDENT_AMBULATORY_CARE_PROVIDER_SITE_OTHER): Payer: Self-pay | Admitting: Family Medicine

## 2016-07-01 VITALS — BP 153/74 | HR 79 | Ht 61.5 in | Wt 142.0 lb

## 2016-07-01 DIAGNOSIS — S99912D Unspecified injury of left ankle, subsequent encounter: Secondary | ICD-10-CM

## 2016-07-01 DIAGNOSIS — M25572 Pain in left ankle and joints of left foot: Secondary | ICD-10-CM

## 2016-07-01 NOTE — Progress Notes (Signed)
   Subjective:    Patient ID: Amanda [name], female    DOB: 06-Nov-1951, 65 y.o.   MRN: [id number]  HPI Left foot and ankle pain  Follow-up for small avulsion fracture from the navicular bone in the left foot. She's much improved. She's been wearing the brace most of the time. Overall her symptoms are about 75-80% better. She still having some swelling particularly at night.   Review of Systems See history of present illness above. No fever, sweats, chills.    Objective:   Physical Exam Vital signs are reviewed GEN.: Well-developed female no acute distress ANKLE: Left. Intact anterior drawer that symmetrical to the other side. No tenderness to palpation over the navicular area. Eversion inversion strength and motion intact. SENSATION: Slight decreased sensation to soft touch bilateral feet which is consistent with some mild diabetic neuropathy. VASCULAR: Dorsalis pedis pulses 2+ bilaterally equal       Assessment & Plan:

## 2016-07-01 NOTE — Assessment & Plan Note (Signed)
90% resolved avulsion fracture from the left navicular bone. She still having some swelling so I encouraged her to keep her feet elevated when seated. I gave her handout on ankle strengthening exercise and reviewed that with her, gave her a yellow exercise band. She can continue to wear the brace during the day when necessary and gradually wean out of it. She can follow-up when necessary. I would expect this to fully resolve over the next 4 weeks.

## 2016-07-04 ENCOUNTER — Other Ambulatory Visit: Payer: Self-pay | Admitting: Pharmacist

## 2016-07-04 DIAGNOSIS — E1142 Type 2 diabetes mellitus with diabetic polyneuropathy: Secondary | ICD-10-CM

## 2016-07-04 DIAGNOSIS — J302 Other seasonal allergic rhinitis: Secondary | ICD-10-CM

## 2016-07-04 DIAGNOSIS — F331 Major depressive disorder, recurrent, moderate: Secondary | ICD-10-CM

## 2016-07-04 DIAGNOSIS — I10 Essential (primary) hypertension: Secondary | ICD-10-CM

## 2016-07-04 DIAGNOSIS — Z9189 Other specified personal risk factors, not elsewhere classified: Secondary | ICD-10-CM

## 2016-07-04 DIAGNOSIS — E785 Hyperlipidemia, unspecified: Secondary | ICD-10-CM

## 2016-07-04 DIAGNOSIS — Z794 Long term (current) use of insulin: Secondary | ICD-10-CM

## 2016-07-04 DIAGNOSIS — R300 Dysuria: Secondary | ICD-10-CM

## 2016-07-04 MED ORDER — LORATADINE 10 MG PO TABS: 10.0000 mg | ORAL_TABLET | Freq: Every day | ORAL | Status: DC

## 2016-07-04 MED ORDER — MOMETASONE FUROATE 50 MCG/ACT NA SUSP: 2.0000 | Freq: Every day | NASAL | Status: DC

## 2016-07-04 MED ORDER — ATORVASTATIN CALCIUM 10 MG PO TABS: 10.0000 mg | ORAL_TABLET | Freq: Every day | ORAL | Status: DC

## 2016-07-04 MED ORDER — ASPIRIN 81 MG PO TABS: 81.0000 mg | ORAL_TABLET | Freq: Every day | ORAL | Status: DC

## 2016-07-04 MED ORDER — BASAGLAR KWIKPEN 100 UNIT/ML ~~LOC~~ SOPN: 6.0000 [IU] | PEN_INJECTOR | Freq: Every day | SUBCUTANEOUS | Status: DC

## 2016-07-04 MED ORDER — SERTRALINE HCL 50 MG PO TABS: 50.0000 mg | ORAL_TABLET | Freq: Every day | ORAL | Status: DC

## 2016-07-04 MED ORDER — METFORMIN HCL 500 MG PO TABS: 500.0000 mg | ORAL_TABLET | Freq: Two times a day (BID) | ORAL | Status: DC

## 2016-07-04 MED ORDER — AMLODIPINE BESYLATE 5 MG PO TABS: 5.0000 mg | ORAL_TABLET | Freq: Every day | ORAL | Status: DC

## 2016-07-04 MED ORDER — LISINOPRIL 40 MG PO TABS
40.0000 mg | ORAL_TABLET | Freq: Every day | ORAL | Status: DC
Start: 2016-07-04 — End: 2016-08-25

## 2016-07-04 MED ORDER — HYDROCHLOROTHIAZIDE 25 MG PO TABS: 25.0000 mg | ORAL_TABLET | Freq: Every day | ORAL | Status: DC

## 2016-07-04 NOTE — Progress Notes (Signed)
Patient enrolled into [place] by pharmacy student, prescriptions transferred.

## 2016-07-05 ENCOUNTER — Telehealth: Payer: Self-pay | Admitting: Internal Medicine

## 2016-07-05 NOTE — Telephone Encounter (Signed)
Pt requesting her [facility] results.

## 2016-07-08 ENCOUNTER — Telehealth: Payer: Self-pay | Admitting: Internal Medicine

## 2016-07-08 ENCOUNTER — Other Ambulatory Visit: Payer: Self-pay | Admitting: *Deleted

## 2016-07-08 NOTE — Telephone Encounter (Signed)
These have been sent on the 17th

## 2016-07-08 NOTE — Telephone Encounter (Signed)
NORVASC, METFORMIN, LISNOPRIL,LIPITOR AND HYDRODIURIL REFILLS

## 2016-07-08 NOTE — Telephone Encounter (Signed)
Refills have been done

## 2016-07-22 NOTE — Telephone Encounter (Signed)
Called pt, she stated she was asleep and call her back, informed her to call clinic at her convenience

## 2016-08-15 ENCOUNTER — Ambulatory Visit (INDEPENDENT_AMBULATORY_CARE_PROVIDER_SITE_OTHER): Payer: No Typology Code available for payment source | Admitting: Internal Medicine

## 2016-08-15 VITALS — BP 144/68 | HR 81 | Temp 98.1°F | Wt 149.9 lb

## 2016-08-15 DIAGNOSIS — M8580 Other specified disorders of bone density and structure, unspecified site: Secondary | ICD-10-CM

## 2016-08-15 DIAGNOSIS — Z23 Encounter for immunization: Secondary | ICD-10-CM

## 2016-08-15 DIAGNOSIS — I10 Essential (primary) hypertension: Secondary | ICD-10-CM

## 2016-08-15 DIAGNOSIS — Z7984 Long term (current) use of oral hypoglycemic drugs: Secondary | ICD-10-CM

## 2016-08-15 DIAGNOSIS — E1140 Type 2 diabetes mellitus with diabetic neuropathy, unspecified: Secondary | ICD-10-CM

## 2016-08-15 DIAGNOSIS — Z79899 Other long term (current) drug therapy: Secondary | ICD-10-CM

## 2016-08-15 DIAGNOSIS — Z0000 Encounter for general adult medical examination without abnormal findings: Secondary | ICD-10-CM

## 2016-08-15 DIAGNOSIS — E1142 Type 2 diabetes mellitus with diabetic polyneuropathy: Secondary | ICD-10-CM

## 2016-08-15 DIAGNOSIS — Z794 Long term (current) use of insulin: Secondary | ICD-10-CM

## 2016-08-15 DIAGNOSIS — F331 Major depressive disorder, recurrent, moderate: Secondary | ICD-10-CM

## 2016-08-15 DIAGNOSIS — K5900 Constipation, unspecified: Secondary | ICD-10-CM

## 2016-08-15 LAB — GLUCOSE, CAPILLARY: Glucose-Capillary: 108 mg/dL — ABNORMAL HIGH (ref 65–99)

## 2016-08-15 LAB — POCT GLYCOSYLATED HEMOGLOBIN (HGB A1C): Hemoglobin A1C: 7.3

## 2016-08-15 MED ORDER — GABAPENTIN 300 MG PO CAPS: 300.0000 mg | ORAL_CAPSULE | Freq: Every day | ORAL | 2 refills | Status: DC

## 2016-08-15 MED ORDER — METFORMIN HCL 500 MG PO TABS: 1000.0000 mg | ORAL_TABLET | Freq: Two times a day (BID) | ORAL | 0 refills | Status: DC

## 2016-08-15 NOTE — Patient Instructions (Addendum)
Pleased to meet you today!  For your depression, please take the Zoloft every day.  For you diabetes, I have increased your Metformin to 1000 mg twice per day.  Please continue taking the same 6U of insulin and checking your blood sugars in the morning.  For your diabetic nerve pain, I have prescribed Gabapentin.  It can make you feel sleepy, but the sleepiness usually gets less as you continuing taking the medicine. Start by taking 300 mg (one pill) at bedtime.  After taking it at bedtime for a couple of days, you can also take it in the middle of the day.  If that doesn't make you too sleepy, a few days later you can also add a morning dose so that you are taking it 3 times per day.

## 2016-08-15 NOTE — Assessment & Plan Note (Signed)
Numbness and painful paresthesias in feet ("pins and needles") have been bothering her more in past several months.  Worst at night, somewhat better while active, though she also thinks she'd walk more for exercise if her feet didn't hurt.  Previously trial of gabapentin several years ago; recalls feeling drowsy, does not remember if efficacious.  -Start Gabapentin 300 mg QHS -Option to increase to 300 mg TID if drowsiness is tolerated -Consider Duloextine for depression and neuropathy in future

## 2016-08-15 NOTE — Assessment & Plan Note (Addendum)
BP Readings from Last 3 Encounters:  08/15/16 (!) 144/68  07/01/16 (!) 153/74  06/15/16 127/67   Lab Results  Component Value Date   CREATININE 0.97 06/16/2015   Lab Results  Component Value Date   K 4.2 06/16/2015   On lisinopril 40 mg daily, amlodipine 5 mg daily, HCTZ 25 mg daily. Reports BPs 130s/80s with home BP monitor.  Assessment BP goal: <130/90 BP control: Near goal  Plan Medications: Continue current meds Other: Discussed exercise and weight loss

## 2016-08-15 NOTE — Assessment & Plan Note (Addendum)
Lab Results  Component Value Date   HGBA1C 7.3 08/15/2016   HGBA1C 7.4 04/26/2016   HGBA1C 7.2 11/24/2015   Reports fasting BGs at home are normally 100-200, with occasional symptomatic lows in the 70s resolved with a snack. Followed by [facility] for proliferative diabetic retinopathy and diabetic macular edema.  Assessment HgbA1c goal: <7 Blood Sugar control: Near goal  On 6U glargine qAM and 500 mg metformin BID.  Plan Medications: Increase Metformin to 1000 mg BID Insulin: Continue 6U glargine Other: Counseled regarding diet and weight loss Check BMP now and at next visit for renal function with metformin dose increase.

## 2016-08-15 NOTE — Assessment & Plan Note (Signed)
DEXA scan 05/2016 showing osteopenia.  She reports not having taken her Vitamin D and Calcium supplements for "a little bit". -Reviewed results and implications of osteopenia diagnosis -Recommended resuming D3 supplements and multivitamin

## 2016-08-15 NOTE — Assessment & Plan Note (Signed)
Flu shot today 

## 2016-08-15 NOTE — Progress Notes (Signed)
   CC: Painful paresthesias in feet  HPI:  Amanda [name] is a 65 y.o. woman with history of DM2, HTN, and HL who presents for a scheduled visit for treatment of her chronic diseases.  She notes worse mood and painful tingling in her feet.  Please see Encounters tab for further history by problem.    Past Medical History:  Diagnosis Date  . Allergy    SEASONAL  . Anemia   . Arthritis   . Cataract    PRESENT IN LEFT/ CATARACTS REMOVED FROM RIGHT  . Diabetes mellitus   . DKA (diabetic ketoacidoses) ([place]) 01/02/2013  . Hyperlipidemia   . Hypertension   . Hypotension   . Influenza A (H1N1) 01/03/2013  . Neuromuscular disorder ([place])    NEUROPATHY  . SYNCOPE 05/10/2010   Qualifier: Diagnosis of  By: Marilynne Halsted RN, BSN, Jacquelyn      Review of Systems:  Review of Systems  Constitutional: Positive for malaise/fatigue. Negative for weight loss.  Respiratory: Negative for cough and shortness of breath.   Cardiovascular: Negative for chest pain and palpitations.  Gastrointestinal: Positive for constipation and diarrhea. Negative for blood in stool and melena.  Neurological:       Numbness and painful tingling in feet, worse at night  Psychiatric/Behavioral: Positive for depression. The patient has insomnia.     Physical Exam:  Vitals:   08/15/16 1558  BP: (!) 144/68  Pulse: 81  Temp: 98.1 F (36.7 C)  TempSrc: Oral  SpO2: 97%  Weight: 149 lb 14.4 oz (68 kg)   Physical Exam  Constitutional: She appears well-developed and well-nourished. No distress.  Cardiovascular: Normal rate and regular rhythm.   Pulmonary/Chest: Effort normal and breath sounds normal.  Neurological:  Sensation in bilateral LE reduced to pinprick to the ankle Proprioception intact in bilateral great toes  Skin: Skin is warm and dry.  Feet without lesions, heavy calluses, or ulcer  Psychiatric:  Pleasant, appropriate, conversational, affect euthymic and reactive    Assessment & Plan:   See  Encounters Tab for problem based charting.  Patient seen with Dr. Evette Doffing

## 2016-08-15 NOTE — Assessment & Plan Note (Addendum)
Continuing intermittent diarrhea and constipation.  Not associated with eating or specific foods.  Had colonoscopy in 2015 without evidence of IBD.  No hematochezia or melena.  IBS a possibility. -Recommend increasing dietary fiber

## 2016-08-15 NOTE — Assessment & Plan Note (Addendum)
  Depression screen [facility] 2/9 08/15/2016 07/01/2016 06/15/2016 06/03/2016 05/06/2016  Decreased Interest 2 0 0 0 0  Down, Depressed, Hopeless 2 0 1 0 0  PHQ - 2 Score 4 0 1 0 0  Altered sleeping 2 - - - 0  Tired, decreased energy 3 - - - 0  Change in appetite 2 - - - 0  Feeling bad or failure about yourself  2 - - - 0  Trouble concentrating 2 - - - 0  Moving slowly or fidgety/restless 0 - - - 0  Suicidal thoughts 0 - - - 0  PHQ-9 Score 15 - - - 0  Difficult doing work/chores - - - - -  Some recent data might be hidden   Reports worse mood for last few months in context of ongoing social stress caring for husband post-stroke and other family stressors.  Depressed mood most days, insomnia, poor concentration, and fatigue.  PHQ-9 15 (moderately severe).  Started taking Sertraline 50 mg only a week ago.  Had previously self-discontinued around 3 months ago when her mood was better and she thought it was making her dizzy.  -Continue Sertraline 50 daily -Emphasized importance of taking medication daily -Re-evaluate at next visit

## 2016-08-16 ENCOUNTER — Telehealth: Payer: Self-pay

## 2016-08-16 ENCOUNTER — Telehealth: Payer: Self-pay | Admitting: *Deleted

## 2016-08-16 LAB — BMP8+ANION GAP
Anion Gap: 15 mmol/L (ref 10.0–18.0)
BUN/Creatinine Ratio: 25 (ref 12–28)
BUN: 26 mg/dL (ref 8–27)
CO2: 20 mmol/L (ref 18–29)
Calcium: 9 mg/dL (ref 8.7–10.3)
Chloride: 106 mmol/L (ref 96–106)
Creatinine, Ser: 1.05 mg/dL — ABNORMAL HIGH (ref 0.57–1.00)
GFR calc Af Amer: 65 mL/min/{1.73_m2} (ref >59)
GFR calc non Af Amer: 56 mL/min/{1.73_m2} — ABNORMAL LOW (ref >59)
Glucose: 107 mg/dL — ABNORMAL HIGH (ref 65–99)
Potassium: 4.8 mmol/L (ref 3.5–5.2)
Sodium: 141 mmol/L (ref 134–144)

## 2016-08-16 NOTE — Telephone Encounter (Signed)
Questions about meds. Please call pt back.  

## 2016-08-16 NOTE — Telephone Encounter (Signed)
Received call from pt's pharmacy stating they had received rx for gabapentin (which they show pt has an allergy to). Per pt's EMR-this was discussed with pt and MD during 08/15/2016 office visit in [facility].    Dr. Inda Castle,  Pt's pharmacy is calling to confirm that gabapentin was rx'd-although chart shows an allergy. Please confirm.Regenia Skeeter, Eudora Guevarra Cassady8/29/201710:36 AM

## 2016-08-17 ENCOUNTER — Telehealth: Payer: Self-pay

## 2016-08-17 NOTE — Telephone Encounter (Signed)
I would like her to have Gabapentin.  She does not have an allergy.  She previously had some sleepiness with gabapentin, but with the progression of her neuropathy symptoms she would like to try gabapentin again.

## 2016-08-17 NOTE — Telephone Encounter (Signed)
Called patient at home and cell numbers.  No answer.

## 2016-08-17 NOTE — Progress Notes (Signed)
Internal Medicine Clinic Attending  I saw and evaluated the patient.  I personally confirmed the key portions of the history and exam documented by Dr. O'Sullivan and I reviewed pertinent patient test results.  The assessment, diagnosis, and plan were formulated together and I agree with the documentation in the resident's note.   

## 2016-08-17 NOTE — Telephone Encounter (Signed)
Talked to patient and communicated results.

## 2016-08-17 NOTE — Telephone Encounter (Signed)
Request send to her doctor.

## 2016-08-17 NOTE — Telephone Encounter (Signed)
Returned pt's call - has questions about medications; making sure she's taking the correct ones;on several meds; suggested talking to Mannie Stabile. Appt scheduled w/Dr Maudie Mercury 9/5.

## 2016-08-17 NOTE — Telephone Encounter (Signed)
Thank you Glenda... 

## 2016-08-17 NOTE — Telephone Encounter (Signed)
Requesting lab result. Please call pt back.  

## 2016-08-23 ENCOUNTER — Ambulatory Visit: Payer: No Typology Code available for payment source | Admitting: Pharmacist

## 2016-08-25 ENCOUNTER — Ambulatory Visit (INDEPENDENT_AMBULATORY_CARE_PROVIDER_SITE_OTHER): Payer: Self-pay | Admitting: Internal Medicine

## 2016-08-25 ENCOUNTER — Encounter: Payer: Self-pay | Admitting: Internal Medicine

## 2016-08-25 VITALS — BP 140/69 | HR 77 | Temp 98.2°F | Ht 61.5 in | Wt 147.1 lb

## 2016-08-25 DIAGNOSIS — E785 Hyperlipidemia, unspecified: Secondary | ICD-10-CM

## 2016-08-25 DIAGNOSIS — K5900 Constipation, unspecified: Secondary | ICD-10-CM

## 2016-08-25 DIAGNOSIS — E1140 Type 2 diabetes mellitus with diabetic neuropathy, unspecified: Secondary | ICD-10-CM

## 2016-08-25 DIAGNOSIS — Z794 Long term (current) use of insulin: Secondary | ICD-10-CM

## 2016-08-25 DIAGNOSIS — J302 Other seasonal allergic rhinitis: Secondary | ICD-10-CM

## 2016-08-25 DIAGNOSIS — Z79899 Other long term (current) drug therapy: Secondary | ICD-10-CM

## 2016-08-25 DIAGNOSIS — F331 Major depressive disorder, recurrent, moderate: Secondary | ICD-10-CM

## 2016-08-25 DIAGNOSIS — Z7984 Long term (current) use of oral hypoglycemic drugs: Secondary | ICD-10-CM

## 2016-08-25 DIAGNOSIS — Z9189 Other specified personal risk factors, not elsewhere classified: Secondary | ICD-10-CM

## 2016-08-25 DIAGNOSIS — E1142 Type 2 diabetes mellitus with diabetic polyneuropathy: Secondary | ICD-10-CM

## 2016-08-25 DIAGNOSIS — I10 Essential (primary) hypertension: Secondary | ICD-10-CM

## 2016-08-25 MED ORDER — LISINOPRIL 40 MG PO TABS: 40.0000 mg | ORAL_TABLET | Freq: Every day | ORAL | 3 refills | Status: DC

## 2016-08-25 MED ORDER — DULOXETINE HCL 60 MG PO CPEP: 60.0000 mg | ORAL_CAPSULE | Freq: Every day | ORAL | 3 refills | Status: DC

## 2016-08-25 MED ORDER — MOMETASONE FUROATE 50 MCG/ACT NA SUSP: 2.0000 | Freq: Every day | NASAL | 2 refills | Status: DC

## 2016-08-25 MED ORDER — CETIRIZINE HCL 10 MG PO TABS: 5.0000 mg | ORAL_TABLET | Freq: Every day | ORAL | 2 refills | Status: DC

## 2016-08-25 MED ORDER — HYDROCHLOROTHIAZIDE 25 MG PO TABS: 25.0000 mg | ORAL_TABLET | Freq: Every day | ORAL | 3 refills | Status: DC

## 2016-08-25 MED ORDER — ATORVASTATIN CALCIUM 10 MG PO TABS: 10.0000 mg | ORAL_TABLET | Freq: Every day | ORAL | 3 refills | Status: DC

## 2016-08-25 MED ORDER — METFORMIN HCL 500 MG PO TABS
500.0000 mg | ORAL_TABLET | Freq: Two times a day (BID) | ORAL | 3 refills | Status: DC
Start: 2016-08-25 — End: 2016-10-19

## 2016-08-25 MED ORDER — SENNA 8.6 MG PO TABS: 1.0000 | ORAL_TABLET | Freq: Every day | ORAL | 2 refills | Status: DC | PRN

## 2016-08-25 MED ORDER — ASPIRIN 81 MG PO TABS: 81.0000 mg | ORAL_TABLET | Freq: Every day | ORAL | 3 refills | Status: DC

## 2016-08-25 NOTE — Assessment & Plan Note (Signed)
Long-standing problem, states that she does not have a bowel movement for 2-3 days and then has large loose bowel movements. Has not tried taking anything for this.  Plan: - Order previously prescribed Senna QD PRN

## 2016-08-25 NOTE — Assessment & Plan Note (Signed)
Endorsing significant congestion headaches postnasal drip and ear discomfort for the past several weeks. She is taking her mometasone nasal spray daily.  Plan: - Add cetirizine 5-10 mg daily

## 2016-08-25 NOTE — Assessment & Plan Note (Signed)
Patient has been hypotensive and orthostatic in the systolics 161-096 range at home. She endorses dizziness and subjective weakness. No blood pressure medication changes were made at last visit, however metformin was increased and gabapentin was added.  Plan: -Discontinue amlodipine  -Continue HCTZ and lisinopril at current dosing -Follow up in 2 weeks

## 2016-08-25 NOTE — Assessment & Plan Note (Addendum)
Began taking gabapentin 300 mg nightly at last visit, and endorsing significant drowsiness and malaise with no resolution of neuropathic symptoms.  Plan: - Stop Gabapentin - Start Cymbalta 60 mg daily for neuropathic pain and depression

## 2016-08-25 NOTE — Patient Instructions (Signed)
Thank you for visiting [place] today!  We have made several changes to your medications: - Stop taking Amlodipine, Gabapentin, and Zoloft - Take a reduced dose of Metformin (500 mg BID) - Start taking Cymbalta 60 mg daily for neuropathic pain and depression - Start taking 0.5-1 tablets of Zyrtec daily for allergies/congestion - Start taking Senna daily as needed for constipation (you may also find Miralax over the counter to be helpful for this)  Please visit [place] in 2 weeks to keep track of your symptoms.

## 2016-08-25 NOTE — Progress Notes (Signed)
   CC: Orthostatic symptoms, malaise  HPI:  Amanda [name] is a 65 y.o. female with PMHx detailed below presenting with low blood pressures at home and orthostatic symptoms, as well as difficulties with constipation and congestion.  See problem based assessment and plan below for additional details.  Past Medical History:  Diagnosis Date  . Allergy    SEASONAL  . Anemia   . Arthritis   . Cataract    PRESENT IN LEFT/ CATARACTS REMOVED FROM RIGHT  . Diabetes mellitus   . DKA (diabetic ketoacidoses) ([place]) 01/02/2013  . Hyperlipidemia   . Hypertension   . Hypotension   . Influenza A (H1N1) 01/03/2013  . Neuromuscular disorder ([place])    NEUROPATHY  . SYNCOPE 05/10/2010   Qualifier: Diagnosis of  By: Marilynne Halsted RN, BSN, Jacquelyn      Review of Systems: Review of Systems  Constitutional: Positive for chills and malaise/fatigue. Negative for fever.  HENT: Positive for congestion. Negative for ear discharge, ear pain and sore throat.   Eyes: Positive for blurred vision.  Respiratory: Negative for cough.   Cardiovascular: Negative for chest pain.  Gastrointestinal: Positive for constipation and diarrhea. Negative for abdominal pain.  Musculoskeletal: Positive for neck pain.  Neurological: Positive for weakness and headaches.  Psychiatric/Behavioral: Positive for depression.     Physical Exam: Vitals:   08/25/16 0826  BP: 140/69  Pulse: 77  Temp: 98.2 F (36.8 C)  TempSrc: Oral  SpO2: 100%  Weight: 147 lb 1.6 oz (66.7 kg)  Height: 5' 1.5" (1.562 m)   GENERAL- Woman sitting comfortably in exam room chair, alert, in no distress HEENT- Atraumatic, PERRL, EOMI, moist mucous membranes, good dentition, no carotid bruit, no cervical lymphadenopathy, sinuses tender to palpation bilaterally, tense neck musculature on the right, TMs appear normal bilaterally CARDIAC- Regular rate and rhythm, no murmurs, rubs or gallops. RESP- Clear to ascultation bilaterally, no wheezing or crackles,  normal work of breathing ABDOMEN- Normoactive bowel sounds, soft, nontender, nondistended BACK- Normal curvature, no spinal tenderness EXTREMITIES- Normal bulk and range of motion, no edema, 2+ peripheral pulses SKIN- Warm, dry, intact, without visible rash PSYCH- Tearful affect, clear speech, thoughts linear and goal-directed   Assessment & Plan:   See encounters tab for problem based medical decision making.  Patient seen with Dr. Daryll Drown

## 2016-08-25 NOTE — Assessment & Plan Note (Signed)
Ongoing depression and poor mood due to social stressors including husband requiring significant assistance following a stroke. Has poor tolerance for SSRIs in the past ("feels fuzzy headed") but never takes them more than a couple days before stopping.  Plan: - Starting Cymbalta 60 mg daily

## 2016-08-29 NOTE — Progress Notes (Signed)
Internal Medicine Clinic Attending  I saw and evaluated the patient.  I personally confirmed the key portions of the history and exam documented by Dr. Johnson and I reviewed pertinent patient test results.  The assessment, diagnosis, and plan were formulated together and I agree with the documentation in the resident's note.  

## 2016-09-01 ENCOUNTER — Ambulatory Visit (INDEPENDENT_AMBULATORY_CARE_PROVIDER_SITE_OTHER): Payer: Self-pay | Admitting: Internal Medicine

## 2016-09-01 ENCOUNTER — Telehealth: Payer: Self-pay | Admitting: Internal Medicine

## 2016-09-01 DIAGNOSIS — K5900 Constipation, unspecified: Secondary | ICD-10-CM

## 2016-09-01 DIAGNOSIS — I10 Essential (primary) hypertension: Secondary | ICD-10-CM

## 2016-09-01 DIAGNOSIS — F331 Major depressive disorder, recurrent, moderate: Secondary | ICD-10-CM

## 2016-09-01 DIAGNOSIS — E1140 Type 2 diabetes mellitus with diabetic neuropathy, unspecified: Secondary | ICD-10-CM

## 2016-09-01 DIAGNOSIS — Z794 Long term (current) use of insulin: Secondary | ICD-10-CM

## 2016-09-01 NOTE — Telephone Encounter (Signed)
Agree with appointment in [facility] today.  With the very narrow (and even non-existent) pulse pressures she is getting at home I have significant concerns about the quality and accuracy of this data and the quality and accuracy of the blood pressure machine she is using at home.  We definitely need to check its calibration compared to our blood pressure measurements.

## 2016-09-01 NOTE — Assessment & Plan Note (Signed)
She has an history of depression but unable to tolerate most of SSRI. She was recently started on Cymbalta 60 mg daily and we stopped her Zoloft as she was not tolerating that. She thinks her current symptoms of being lightheaded started after she started Cymbalta. Plan. Stop Cymbalta, we advised her to watch for any worsening depression. We will reassess her during her next follow-up visit.

## 2016-09-01 NOTE — Telephone Encounter (Signed)
Return pt's call - concern about low blood pressures since started new medication, Cymbalta. States it helps w/neuropathy of feet. States she feeling weak. I ask about BP's - has home BP machine - this am around 2 Am 48/38 then 102 /91 hr 70 then 11AM 70/70  HR 89. Earlier this week BP's have been 109/93, 103/71.  Gave appt for today [facility] @ 1315PM; she will bring her BP machine. But told her if she feels worse to call 911 or go to ED.

## 2016-09-01 NOTE — Patient Instructions (Addendum)
It was pleasure taking care of today. Please stop taking your Cymbalta and see if your symptoms resolved. I am also stopping your HCTZ, we will reassess your blood pressure during her next follow-up. Whenever you go see your eye Dr. talk with him regarding your eyedrops(Cosopt) and possibility of them decreasing your heart rate. Ask him if he can replace those eyedrops is something else. Please come back in 1 week time to have your blood pressure checkup and see if your symptoms resolve.

## 2016-09-01 NOTE — Telephone Encounter (Signed)
Has question for nurse about her BP

## 2016-09-01 NOTE — Assessment & Plan Note (Signed)
She do complain of on and off of constipation. She said she had irritable bowel. I advised her to add some fiber in her diet and drink plenty of fluids. She can use senna as needed.

## 2016-09-01 NOTE — Assessment & Plan Note (Signed)
Her blood sugar remained between 100-120 at home mostly.  Continue with the current management.

## 2016-09-01 NOTE — Progress Notes (Signed)
   CC: Becoming dizzy with change in posture for about 1 week.  HPI:  Amanda [name] is a 65 y.o.with PMHx as listed below. Came to the clinic with c/o being dizzy, lightheaded with change in position especially from lying to sitting or standing. She  complaint of getting these symptoms 3-4 times daily for the last 1 week. She becomes lightheaded accompanied with sweating, mild nausea without any vomiting and blurry vision lasted for a few minutes. She thinks her symptoms started after she was started on Cymbalta last week. She is checking her blood pressure at home, she had some pretty off readings. We compare her home let pressure monitor with our blood pressure machines at clinic and found that her blood pressure home monitor was not working properly. She to have positive orthostatic during this visit. She denies any headache, vertigo, recent URI, palpitations, chest pain or exertional dyspnea.  Past Medical History:  Diagnosis Date  . Allergy    SEASONAL  . Anemia   . Arthritis   . Cataract    PRESENT IN LEFT/ CATARACTS REMOVED FROM RIGHT  . Diabetes mellitus   . DKA (diabetic ketoacidoses) ([place]) 01/02/2013  . Hyperlipidemia   . Hypertension   . Hypotension   . Influenza A (H1N1) 01/03/2013  . Neuromuscular disorder ([place])    NEUROPATHY  . SYNCOPE 05/10/2010   Qualifier: Diagnosis of  By: Marilynne Halsted, RN, BSN, Jacquelyn      Review of Systems:  As per HPI  Physical Exam:  Vitals:   09/01/16 1605  BP: 105/62  Pulse: 85  Temp: 98.3 F (36.8 C)  TempSrc: Oral  SpO2: 100%  Weight: 143 lb 8 oz (65.1 kg)   Orthostatic VS for the past 24 hrs (Last 3 readings):  BP- Lying Pulse- Lying BP- Sitting Pulse- Sitting BP- Standing at 0 minutes Pulse- Standing at 0 minutes BP- Standing at 3 minutes Pulse- Standing at 3 minutes  09/01/16 1610 140/74 75 102/66 76 (!) 129/112 65 (!) 120/104 78   Gen. Well-built, well-nourished lady, no acute distress. HEENT. Atraumatic,  normocephalic,PERRL. Lungs. Clear bilaterally CV.RRR,No R/G/M Abdomen. Soft, nontender, nondistended, bowel sounds positive. Extremities. No edema, no cyanosis, pulses 2+ bilaterally.  Assessment & Plan:   See Encounters Tab for problem based charting.  Patient seen with Dr. Eppie Gibson

## 2016-09-01 NOTE — Assessment & Plan Note (Signed)
Her Neurontin was stopped during last visit as she was complaining of more drowsiness with that. She was started on Cymbalta to address both her pain and depression.  We stopped Cymbalta due to her current complaint. I advised her to take some Tylenol if her pain bothers her. We can reassess and maybe start Lyrica if needed.

## 2016-09-01 NOTE — Assessment & Plan Note (Addendum)
BP Readings from Last 3 Encounters:  09/01/16 105/62  08/25/16 140/69  08/15/16 (!) 144/68   Orthostatic VS for the past 24 hrs (Last 3 readings):  BP- Lying Pulse- Lying BP- Sitting Pulse- Sitting BP- Standing at 0 minutes Pulse- Standing at 0 minutes BP- Standing at 3 minutes Pulse- Standing at 3 minutes  09/01/16 1610 140/74 75 102/66 76 (!) 129/112 65 (!) 120/104 78   She was found to be orthostatic, without any concurrent increase in her heart rate.A signed of chronotropic incompetence.  She did not took any of her medicine today. She is not on a beta blocker, but to use eyedrops containing temolol for her glaucoma. She was concerned that her symptoms started after she was started on Cymbalta last week. Plan. Stop Cymbalta, and watch if her symptoms improve. I stopped her HCTZ today too, we will reassess her next week and make adjustment accordingly. We advised her to talk with her eye doctor regarding those eyedrops, although unlikely but it can cause decrease in her heart rate. She has her eye appointment coming up next Monday.

## 2016-09-02 LAB — GLUCOSE, POCT (MANUAL RESULT ENTRY): POC Glucose: 212 mg/dl — AB (ref 70–99)

## 2016-09-06 NOTE — Progress Notes (Signed)
I saw and evaluated the patient.  I personally confirmed the key portions of Dr. Amin's history and exam and reviewed pertinent patient test results.  The assessment, diagnosis, and plan were formulated together and I agree with the documentation in the resident's note. 

## 2016-09-07 ENCOUNTER — Telehealth: Payer: Self-pay | Admitting: Internal Medicine

## 2016-09-07 NOTE — Telephone Encounter (Signed)
APT. REMINDER CALL, LMTCB °

## 2016-09-08 ENCOUNTER — Encounter: Payer: Self-pay | Admitting: Internal Medicine

## 2016-09-08 ENCOUNTER — Ambulatory Visit (INDEPENDENT_AMBULATORY_CARE_PROVIDER_SITE_OTHER): Payer: Self-pay | Admitting: Internal Medicine

## 2016-09-08 VITALS — BP 113/62 | HR 74 | Temp 98.1°F | Ht 61.5 in | Wt 142.1 lb

## 2016-09-08 DIAGNOSIS — M545 Low back pain, unspecified: Secondary | ICD-10-CM

## 2016-09-08 DIAGNOSIS — Z79899 Other long term (current) drug therapy: Secondary | ICD-10-CM

## 2016-09-08 DIAGNOSIS — M542 Cervicalgia: Secondary | ICD-10-CM

## 2016-09-08 DIAGNOSIS — S99912A Unspecified injury of left ankle, initial encounter: Secondary | ICD-10-CM

## 2016-09-08 DIAGNOSIS — I10 Essential (primary) hypertension: Secondary | ICD-10-CM

## 2016-09-08 DIAGNOSIS — M549 Dorsalgia, unspecified: Secondary | ICD-10-CM | POA: Insufficient documentation

## 2016-09-08 DIAGNOSIS — G8929 Other chronic pain: Secondary | ICD-10-CM

## 2016-09-08 MED ORDER — NAPROXEN 500 MG PO TABS: 500.0000 mg | ORAL_TABLET | Freq: Two times a day (BID) | ORAL | 1 refills | Status: DC

## 2016-09-08 MED ORDER — LISINOPRIL 10 MG PO TABS: 10.0000 mg | ORAL_TABLET | Freq: Every day | ORAL | 3 refills | Status: DC

## 2016-09-08 MED ORDER — DICLOFENAC SODIUM 1 % TD GEL: 4.0000 g | Freq: Four times a day (QID) | TRANSDERMAL | 0 refills | Status: DC

## 2016-09-08 NOTE — Patient Instructions (Addendum)
Thanks for coming to clinic today. You are still having decrease in your blood pressure with change in posture. I'm decreasing her blood pressure medicine lisinopril from 40 to 10 mg daily. I'm giving you a Voltaren gel and a prescription for naproxen for your back ache. I'm also providing you with some instructions to take care of your back and to do some back exercises. Please follow up in about a week for your blood pressure checkup.     Back Pain, Adult Back pain is very common in adults.The cause of back pain is rarely dangerous and the pain often gets better over time.The cause of your back pain may not be known. Some common causes of back pain include:  Strain of the muscles or ligaments supporting the spine.  Wear and tear (degeneration) of the spinal disks.  Arthritis.  Direct injury to the back. For many people, back pain may return. Since back pain is rarely dangerous, most people can learn to manage this condition on their own. HOME CARE INSTRUCTIONS Watch your back pain for any changes. The following actions may help to lessen any discomfort you are feeling:  Remain active. It is stressful on your back to sit or stand in one place for long periods of time. Do not sit, drive, or stand in one place for more than 30 minutes at a time. Take short walks on even surfaces as soon as you are able.Try to increase the length of time you walk each day.  Exercise regularly as directed by your health care provider. Exercise helps your back heal faster. It also helps avoid future injury by keeping your muscles strong and flexible.  Do not stay in bed.Resting more than 1-2 days can delay your recovery.  Pay attention to your body when you bend and lift. The most comfortable positions are those that put less stress on your recovering back. Always use proper lifting techniques, including:  Bending your knees.  Keeping the load close to your body.  Avoiding twisting.  Find a  comfortable position to sleep. Use a firm mattress and lie on your side with your knees slightly bent. If you lie on your back, put a pillow under your knees.  Avoid feeling anxious or stressed.Stress increases muscle tension and can worsen back pain.It is important to recognize when you are anxious or stressed and learn ways to manage it, such as with exercise.  Take medicines only as directed by your health care provider. Over-the-counter medicines to reduce pain and inflammation are often the most helpful.Your health care provider may prescribe muscle relaxant drugs.These medicines help dull your pain so you can more quickly return to your normal activities and healthy exercise.  Apply ice to the injured area:  Put ice in a plastic bag.  Place a towel between your skin and the bag.  Leave the ice on for 20 minutes, 2-3 times a day for the first 2-3 days. After that, ice and heat may be alternated to reduce pain and spasms.  Maintain a healthy weight. Excess weight puts extra stress on your back and makes it difficult to maintain good posture. SEEK MEDICAL CARE IF:  You have pain that is not relieved with rest or medicine.  You have increasing pain going down into the legs or buttocks.  You have pain that does not improve in one week.  You have night pain.  You lose weight.  You have a fever or chills. SEEK IMMEDIATE MEDICAL CARE IF:   You  develop new bowel or bladder control problems.  You have unusual weakness or numbness in your arms or legs.  You develop nausea or vomiting.  You develop abdominal pain.  You feel faint.   This information is not intended to replace advice given to you by your health care provider. Make sure you discuss any questions you have with your health care provider.   Document Released: 12/05/2005 Document Revised: 12/26/2014 Document Reviewed: 04/08/2014 Elsevier Interactive Patient Education 2016 [employer]  Back Exercises The  following exercises strengthen the muscles that help to support the back. They also help to keep the lower back flexible. Doing these exercises can help to prevent back pain or lessen existing pain. If you have back pain or discomfort, try doing these exercises 2-3 times each day or as told by your health care provider. When the pain goes away, do them once each day, but increase the number of times that you repeat the steps for each exercise (do more repetitions). If you do not have back pain or discomfort, do these exercises once each day or as told by your health care provider. EXERCISES Single Knee to Chest Repeat these steps 3-5 times for each leg: 1. Lie on your back on a firm bed or the floor with your legs extended. 2. Bring one knee to your chest. Your other leg should stay extended and in contact with the floor. 3. Hold your knee in place by grabbing your knee or thigh. 4. Pull on your knee until you feel a gentle stretch in your lower back. 5. Hold the stretch for 10-30 seconds. 6. Slowly release and straighten your leg. Pelvic Tilt Repeat these steps 5-10 times: 1. Lie on your back on a firm bed or the floor with your legs extended. 2. Bend your knees so they are pointing toward the ceiling and your feet are flat on the floor. 3. Tighten your lower abdominal muscles to press your lower back against the floor. This motion will tilt your pelvis so your tailbone points up toward the ceiling instead of pointing to your feet or the floor. 4. With gentle tension and even breathing, hold this position for 5-10 seconds. Cat-Cow Repeat these steps until your lower back becomes more flexible: 1. Get into a hands-and-knees position on a firm surface. Keep your hands under your shoulders, and keep your knees under your hips. You may place padding under your knees for comfort. 2. Let your head hang down, and point your tailbone toward the floor so your lower back becomes rounded like the back of a  cat. 3. Hold this position for 5 seconds. 4. Slowly lift your head and point your tailbone up toward the ceiling so your back forms a sagging arch like the back of a cow. 5. Hold this position for 5 seconds. Press-Ups Repeat these steps 5-10 times: 1. Lie on your abdomen (face-down) on the floor. 2. Place your palms near your head, about shoulder-width apart. 3. While you keep your back as relaxed as possible and keep your hips on the floor, slowly straighten your arms to raise the top half of your body and lift your shoulders. Do not use your back muscles to raise your upper torso. You may adjust the placement of your hands to make yourself more comfortable. 4. Hold this position for 5 seconds while you keep your back relaxed. 5. Slowly return to lying flat on the floor. Bridges Repeat these steps 10 times: 1. Lie on your back on  a firm surface. 2. Bend your knees so they are pointing toward the ceiling and your feet are flat on the floor. 3. Tighten your buttocks muscles and lift your buttocks off of the floor until your waist is at almost the same height as your knees. You should feel the muscles working in your buttocks and the back of your thighs. If you do not feel these muscles, slide your feet 1-2 inches farther away from your buttocks. 4. Hold this position for 3-5 seconds. 5. Slowly lower your hips to the starting position, and allow your buttocks muscles to relax completely. If this exercise is too easy, try doing it with your arms crossed over your chest. Abdominal Crunches Repeat these steps 5-10 times: 1. Lie on your back on a firm bed or the floor with your legs extended. 2. Bend your knees so they are pointing toward the ceiling and your feet are flat on the floor. 3. Cross your arms over your chest. 4. Tip your chin slightly toward your chest without bending your neck. 5. Tighten your abdominal muscles and slowly raise your trunk (torso) high enough to lift your shoulder  blades a tiny bit off of the floor. Avoid raising your torso higher than that, because it can put too much stress on your low back and it does not help to strengthen your abdominal muscles. 6. Slowly return to your starting position. Back Lifts Repeat these steps 5-10 times: 1. Lie on your abdomen (face-down) with your arms at your sides, and rest your forehead on the floor. 2. Tighten the muscles in your legs and your buttocks. 3. Slowly lift your chest off of the floor while you keep your hips pressed to the floor. Keep the back of your head in line with the curve in your back. Your eyes should be looking at the floor. 4. Hold this position for 3-5 seconds. 5. Slowly return to your starting position. SEEK MEDICAL CARE IF:  Your back pain or discomfort gets much worse when you do an exercise.  Your back pain or discomfort does not lessen within 2 hours after you exercise. If you have any of these problems, stop doing these exercises right away. Do not do them again unless your health care provider says that you can. SEEK IMMEDIATE MEDICAL CARE IF:  You develop sudden, severe back pain. If this happens, stop doing the exercises right away. Do not do them again unless your health care provider says that you can.   This information is not intended to replace advice given to you by your health care provider. Make sure you discuss any questions you have with your health care provider.   Document Released: 01/12/2005 Document Revised: 08/26/2015 Document Reviewed: 01/29/2015 Elsevier Interactive Patient Education [employer].

## 2016-09-08 NOTE — Assessment & Plan Note (Signed)
She also complained of having chronic low back pain and occasional pain in her lower neck. She denies any radiation, tingling , numbness or weakness. Her back pain get worse with more walking around or standing for longer periods. She states that the pain does not effect her daily living. She was using some Tylenol which used to help her before , currently not as helpful . Assessment. Her lower neck pain and back pain most probably due to osteoarthritis,, or some spinal stenosis. She had no nerve compression symptoms at this point. In her pain was controlled with Tylenol, currently Tylenol is not helping her much. Plan. Naproxen 500 mg twice daily if needed. Voltaren gel. I provided her with some back exercise literature. She does not have any coverage at this point, she will be legible for Medicare next year. If her symptoms started bothering her more, we can consider sending her for physical therapy.

## 2016-09-08 NOTE — Assessment & Plan Note (Signed)
BP Readings from Last 3 Encounters:  09/08/16 113/62  09/01/16 105/62  08/25/16 140/69   Orthostatic VS for the past 24 hrs:  BP- Lying Pulse- Lying BP- Sitting Pulse- Sitting BP- Standing at 0 minutes Pulse- Standing at 0 minutes  09/08/16 0903 129/65 70 120/65 72 (!) 77/60 75   She was found to be orthostatic, without any concurrent increase in her heart rate.A signed of chronotropic incompetence.  She did not took any of her medicine today. She brought her blood pressure log and her blood pressure stays within normal limit. There were occasions when the blood pressure was on the softer side. She is still having mild dizziness with change in posture. She did not went to see her ophthalmologist due to some personal reasons and rescheduled her appointment. But she states that she did talk with him on phone about her eye drops containing Timolol and he said that might be a possible reason for her chronotropic incompetence. She will discuss with him regarding change in her management during her next visit.  Plan. She is on lisinopril 40 mg daily I decreased her lisinopril to 10 mg daily considering those positive orthostasis and her blood pressure being on softer side most of her BP log. Follow-up in 2 weeks for blood pressure checkup.

## 2016-09-08 NOTE — Progress Notes (Addendum)
   CC: For her blood pressure follow-up Lower backache.  HPI:  [name] is a 65 y.o. came to the clinic today for her blood pressure follow-up. She was seen 1 week ago in the clinic with complaint of dizziness and found positive orthostatic vitals. We stopped her hydrochlorothiazide at that time. She said that she'll occasionally do become little dizzy with change in posture, but otherwise feeling much better than before. She also complained of having chronic low back pain and occasional pain in her lower neck. She denies any radiation, tingling , numbness or weakness. Her back pain get worse with more walking around or standing for longer periods. She states that the pain does not effect her daily living.  Past Medical History:  Diagnosis Date  . Allergy    SEASONAL  . Anemia   . Arthritis   . Cataract    PRESENT IN LEFT/ CATARACTS REMOVED FROM RIGHT  . Diabetes mellitus   . DKA (diabetic ketoacidoses) ([place]) 01/02/2013  . Hyperlipidemia   . Hypertension   . Hypotension   . Influenza A (H1N1) 01/03/2013  . Neuromuscular disorder ([place])    NEUROPATHY  . SYNCOPE 05/10/2010   Qualifier: Diagnosis of  By: Marilynne Halsted, RN, BSN, Jacquelyn      Review of Systems:  AS per HPI>  Physical Exam:  Vitals:   09/08/16 0833  BP: 113/62  Pulse: 74  Temp: 98.1 F (36.7 C)  TempSrc: Oral  SpO2: 100%  Weight: 142 lb 1.6 oz (64.5 kg)  Height: 5' 1.5" (1.562 m)   Orthostatic VS for the past 24 hrs:  BP- Lying Pulse- Lying BP- Sitting Pulse- Sitting BP- Standing at 0 minutes Pulse- Standing at 0 minutes  09/08/16 0903 129/65 70 120/65 72 (!) 77/60 75   Gen. Well-built, well-nourished lady, no acute distress. HEENT. Atraumatic, normocephalic,PERRL. Lungs. Clear bilaterally CV.RRR,No R/G/M Abdomen. Soft, nontender, nondistended, bowel sounds positive. Musculoskeletal. No erythema, no tenderness, no edema. Range of motion normal. Strength 5/5 in all extremities. Sensations intact  grossly. Extremities. No edema, no cyanosis, pulses 2+ bilaterally.   Assessment & Plan:   See Encounters Tab for problem based charting.  Patient seen with Dr. Dareen Piano.

## 2016-09-12 NOTE — Progress Notes (Signed)
Internal Medicine Clinic Attending  I saw and evaluated the patient.  I personally confirmed the key portions of the history and exam documented by Dr. Amin and I reviewed pertinent patient test results.  The assessment, diagnosis, and plan were formulated together and I agree with the documentation in the resident's note. 

## 2016-09-15 ENCOUNTER — Ambulatory Visit (INDEPENDENT_AMBULATORY_CARE_PROVIDER_SITE_OTHER): Payer: Self-pay | Admitting: Internal Medicine

## 2016-09-15 VITALS — BP 121/51 | HR 71 | Temp 98.0°F | Ht 61.5 in | Wt 142.2 lb

## 2016-09-15 DIAGNOSIS — I951 Orthostatic hypotension: Secondary | ICD-10-CM

## 2016-09-15 DIAGNOSIS — J302 Other seasonal allergic rhinitis: Secondary | ICD-10-CM

## 2016-09-15 DIAGNOSIS — Z794 Long term (current) use of insulin: Secondary | ICD-10-CM

## 2016-09-15 DIAGNOSIS — I10 Essential (primary) hypertension: Secondary | ICD-10-CM

## 2016-09-15 DIAGNOSIS — E119 Type 2 diabetes mellitus without complications: Secondary | ICD-10-CM

## 2016-09-15 MED ORDER — FEXOFENADINE HCL 180 MG PO TABS: 180.0000 mg | ORAL_TABLET | Freq: Every day | ORAL | 1 refills | Status: DC

## 2016-09-15 NOTE — Patient Instructions (Addendum)
Thank you for a visiting clinic today. As you still having drop in blood pressure with change in position we will stop your blood pressure medicine at this point. Please keep logging your blood pressure and if you'll find a consistently increased, you can follow-up with [place]. Please hold your insulin as your blood sugar remains low most of the time. Continue metformin 500 mg twice daily. Keep checking her blood sugar twice daily. Bring your glucometer meter during her next follow-up visit. We will check some blood for the hormones secreted by your adrenal glands as we have discussed. We will call do with any abnormal results. I have changed your allergy medicine to Allegra 180 mg daily. You can follow-up in 2 weeks for reevaluation.

## 2016-09-15 NOTE — Assessment & Plan Note (Signed)
She was complaining that Zyrtec was not controlling her allergy symptoms as it used to do before. She was requesting a change if possible.  Stop Zyrtec. Started her on Allegra 180 mg daily.

## 2016-09-15 NOTE — Assessment & Plan Note (Signed)
BP Readings from Last 3 Encounters:  09/15/16 (!) 121/51  09/08/16 113/62  09/01/16 105/62   She was normotensive at clinic. She never took lisinopril this morning. Please see orthostatic hypertension charting for further evaluation.

## 2016-09-15 NOTE — Progress Notes (Signed)
   CC: For follow-up of her blood pressure. Still complains of dizziness with change in posture.  HPI:  Ms.Aniyiah R Spizzirri is a 65 y.o. with past medical history as listed below, came to the clinic for follow-up of her blood pressure. She was found to be having positive orthostatic vitals with no change in her heart rate during her last 2 visits. He states still complains of being dizzy with change in posture although less as compared to before. During first visit we stopped her hydrochlorothiazide and then on subsequent visit we decreased her lisinopril from 40 mg to 10 mg daily. She did brought a blood pressure log with her today, her blood pressure seems on the softer side most of the time. She also complained of occasionally being little shaky. Her shakiness resolved with eating something. She is on metformin 500 mg twice a day and Lantus 6 units at bedtime for her diabetes. I advised her last time to skip Lantus if her blood sugar is less than 120 after dinner, and she reports that she has skipped quite a few times already. She also complained of mild sinus congestion due to allergies and states that Zyrtec does not work as it used to before. She was provided with naproxen 500 mg twice a day and Voltaren gel for her lower back and neck pains. She was unable to get Voltaren gel from her insurance but she is using it her husband's and found it really useful. She states that her pain is very much controlled with both of them.  Past Medical History:  Diagnosis Date  . Allergy    SEASONAL  . Anemia   . Arthritis   . Cataract    PRESENT IN LEFT/ CATARACTS REMOVED FROM RIGHT  . Diabetes mellitus   . DKA (diabetic ketoacidoses) ([place]) 01/02/2013  . Hyperlipidemia   . Hypertension   . Hypotension   . Influenza A (H1N1) 01/03/2013  . Neuromuscular disorder ([place])    NEUROPATHY  . SYNCOPE 05/10/2010   Qualifier: Diagnosis of  By: Marilynne Halsted, RN, BSN, Jacquelyn      Review of Systems: As per  HPI.  Physical Exam:  Vitals:   09/15/16 0818  BP: (!) 121/51  Pulse: 71  Temp: 98 F (36.7 C)  TempSrc: Oral  SpO2: 100%  Weight: 142 lb 3.2 oz (64.5 kg)  Height: 5' 1.5" (1.562 m)   Gen. well-built, lean lady, no acute distress Lungs.Clear bilaterally CV.RRR,No R/G/M Abdomen.Soft, nontender, nondistended, bowel sounds positive. Musculoskeletal. No erythema, no tenderness, no edema. Range of motion normal. Strength 5/5 in all extremities. Sensations intact grossly. Extremities.No edema, no cyanosis, pulses 2+ bilaterally.   Assessment & Plan:   See Encounters Tab for problem based charting.  Patient seen with Dr. Dareen Piano.

## 2016-09-15 NOTE — Assessment & Plan Note (Addendum)
Orthostatic VS for the past 24 hrs (Last 3 readings):  BP- Lying Pulse- Lying BP- Sitting Pulse- Sitting BP- Standing at 0 minutes Pulse- Standing at 0 minutes  09/15/16 0907 145/59 71 134/60 70 109/57 74   She is still having positive orthostatic vitals. She does complain of being dizzy with change in posture. And she brought the blood pressure log in today her blood pressure stays more on the softer side.  We stopped the lisinopril today. Advised her to keep the blood pressure log and if it stays high consistently she should call us or come back to visit [place]. We will check her cortisol level today, as she is becoming more hypotensive and having more frequent hypoglycemia. She told us today that her ophthalmologist has changed the timolol eyedrop, but she never got the new one and have not started using that. We will reassess her heart rate with decrease in blood pressure during next visit once she has stopped timolol eyedrops.

## 2016-09-15 NOTE — Assessment & Plan Note (Signed)
Her last A1c on August 28 was 7.3. She did brought her blood sugar log today, which shows minimum of 79 and highest of 166. With her blood sugars staying around 100 most of the time. She did complain of couple of incidents where she become little shaky, her shakiness resolved with eating something.  I stopped her Lantus today. Continue with metformin 500 mg twice daily. I told her to keep checking her blood sugar twice daily and bring the glucometer meter during her next follow-up exam.

## 2016-09-15 NOTE — Progress Notes (Signed)
Internal Medicine Clinic Attending  I saw and evaluated the patient.  I personally confirmed the key portions of the history and exam documented by Dr. Amin and I reviewed pertinent patient test results.  The assessment, diagnosis, and plan were formulated together and I agree with the documentation in the resident's note. 

## 2016-09-16 ENCOUNTER — Other Ambulatory Visit: Payer: Self-pay | Admitting: Internal Medicine

## 2016-09-16 DIAGNOSIS — E1121 Type 2 diabetes mellitus with diabetic nephropathy: Secondary | ICD-10-CM

## 2016-09-16 LAB — CORTISOL: Cortisol: 11.1 ug/dL

## 2016-09-22 NOTE — Progress Notes (Signed)
Thank you :)

## 2016-09-28 ENCOUNTER — Telehealth: Payer: Self-pay | Admitting: Internal Medicine

## 2016-09-28 NOTE — Telephone Encounter (Signed)
APT. REMINDER CALL, LMTCB °

## 2016-09-29 ENCOUNTER — Ambulatory Visit: Payer: Self-pay

## 2016-09-29 ENCOUNTER — Ambulatory Visit: Payer: Self-pay | Admitting: Dietician

## 2016-10-05 ENCOUNTER — Ambulatory Visit (INDEPENDENT_AMBULATORY_CARE_PROVIDER_SITE_OTHER): Payer: Self-pay | Admitting: Dietician

## 2016-10-05 ENCOUNTER — Ambulatory Visit (INDEPENDENT_AMBULATORY_CARE_PROVIDER_SITE_OTHER): Payer: Self-pay | Admitting: Internal Medicine

## 2016-10-05 VITALS — BP 112/64 | HR 86 | Temp 97.8°F | Wt 145.6 lb

## 2016-10-05 DIAGNOSIS — Z794 Long term (current) use of insulin: Secondary | ICD-10-CM

## 2016-10-05 DIAGNOSIS — Z9181 History of falling: Secondary | ICD-10-CM

## 2016-10-05 DIAGNOSIS — Z7984 Long term (current) use of oral hypoglycemic drugs: Secondary | ICD-10-CM

## 2016-10-05 DIAGNOSIS — E119 Type 2 diabetes mellitus without complications: Secondary | ICD-10-CM

## 2016-10-05 DIAGNOSIS — I10 Essential (primary) hypertension: Secondary | ICD-10-CM

## 2016-10-05 DIAGNOSIS — R55 Syncope and collapse: Secondary | ICD-10-CM

## 2016-10-05 DIAGNOSIS — Z713 Dietary counseling and surveillance: Secondary | ICD-10-CM

## 2016-10-05 DIAGNOSIS — E1143 Type 2 diabetes mellitus with diabetic autonomic (poly)neuropathy: Secondary | ICD-10-CM

## 2016-10-05 DIAGNOSIS — I951 Orthostatic hypotension: Secondary | ICD-10-CM

## 2016-10-05 DIAGNOSIS — E113519 Type 2 diabetes mellitus with proliferative diabetic retinopathy with macular edema, unspecified eye: Secondary | ICD-10-CM

## 2016-10-05 DIAGNOSIS — E1142 Type 2 diabetes mellitus with diabetic polyneuropathy: Secondary | ICD-10-CM

## 2016-10-05 MED ORDER — ABDOMINAL BINDER/ELASTIC MED MISC: 0 refills | Status: DC

## 2016-10-05 MED ORDER — LISINOPRIL 20 MG PO TABS: 20.0000 mg | ORAL_TABLET | Freq: Every day | ORAL | 11 refills | Status: DC

## 2016-10-05 MED ORDER — MEDICAL COMPRESSION STOCKINGS MISC: 1.0000 | Freq: Every day | 2 refills | Status: DC

## 2016-10-05 NOTE — Assessment & Plan Note (Signed)
BP Readings from Last 3 Encounters:  10/05/16 112/64  09/15/16 (!) 121/51  09/08/16 113/62    Vitals:   10/05/16 0821 10/05/16 0846 10/05/16 0847 10/05/16 0848  BP: (!) 201/85 (!) 170/76 (!) 155/70 112/64  Pulse: 86 85 85 86  Temp: 97.8 F (36.6 C)     TempSrc: Oral     SpO2: 100%     Weight: 145 lb 9.6 oz (66 kg)       Lab Results  Component Value Date   CREATININE 1.05 (H) 08/15/2016   Lab Results  Component Value Date   K 4.8 08/15/2016    Current medications: none  Assessment BP goal: 140/90 BP control: complicated by supine hypertension and positive orthostatics  She has had her medications held over the past month with no improvement in presyncopal symptoms or orthostatic hypotension.  She now has marked supine hypertension.  We will have to work on balancing managing hypertension for cardiovascular health and orthostasis for quality of life.  I think resuming antihypertensives may allow for addition of fludrocortisone or midodrine in the future to try and reduce orthostasis.  Plan Medications: Resume lisinopril 20 mg Other:

## 2016-10-05 NOTE — Patient Instructions (Addendum)
You were seen today for blood pressure and dizzy spells, which we think are due to orthostatic hypotension.  This means that your body isn't regulating blood pressure well, and your blood pressure falls when you stand up.  I think this is most likely due to nerve damage from your diabetes.  I have given you prescriptions for compression stockings and an abdominal binder.  Please get fitted for these at the medical supply store.  Wear the stocking all the time while awake, and try wearing the abdominal binder as much as possible.  I have also re-started your Lisinopril for high blood pressure at a dose of 20 mg per day.  Continue trying to stay well hydrated by drinking lots of water as you have been.  Stand up slowly and make sure you have you balance before walking.  Use a cane, and if you start feeling faint sit down immediately.   Orthostatic Hypotension Orthostatic hypotension is a sudden drop in blood pressure. It happens when you quickly stand up from a seated or lying position. You may feel dizzy or light-headed. This can last for just a few seconds or for up to a few minutes. It is usually not a serious problem. However, if this happens frequently or gets worse, it can be a sign of something more serious. CAUSES  Different things can cause orthostatic hypotension, including:   Loss of body fluids (dehydration).  Medicines that lower blood pressure.  Sudden changes in posture, such as standing up quickly after you have been sitting or lying down.  Taking too much of your medicine. SIGNS AND SYMPTOMS   Light-headedness or dizziness.   Fainting or near-fainting.   A fast heart rate.   Weakness.   Feeling tired (fatigue).  DIAGNOSIS  Your health care provider may do several things to help diagnose your condition and identify the cause. These may include:   Taking a medical history and doing a physical exam.  Checking your blood pressure. Your health care provider will  check your blood pressure when you are:  Lying down.  Sitting.  Standing.  Using tilt table testing. In this test, you lie down on a table that moves from a lying position to a standing position. You will be strapped onto the table. This test monitors your blood pressure and heart rate when you are in different positions. TREATMENT  Treatment will vary depending on the cause. Possible treatments include:   Changing the dosage of your medicines.  Wearing compression stockings on your lower legs.  Standing up slowly after sitting or lying down.  Eating more salt.  Eating frequent, small meals.  In some cases, getting IV fluids.  Taking medicine to enhance fluid retention. HOME CARE INSTRUCTIONS  Only take over-the-counter or prescription medicines as directed by your health care provider.  Follow your health care provider's instructions for changing the dosage of your current medicines.  Do not stop or adjust your medicine on your own.  Stand up slowly after sitting or lying down. This allows your body to adjust to the different position.  Wear compression stockings as directed.  Eat extra salt as directed.  Do not add extra salt to your diet unless directed to by your health care provider.  Eat frequent, small meals.  Avoid standing suddenly after eating.  Avoid hot showers or excessive heat as directed by your health care provider.  Keep all follow-up appointments. SEEK MEDICAL CARE IF:  You continue to feel dizzy or light-headed after  standing.  You feel groggy or confused.  You feel cold, clammy, or sick to your stomach (nauseous).  You have blurred vision.  You feel short of breath. SEEK IMMEDIATE MEDICAL CARE IF:   You faint after standing.  You have chest pain.  You have difficulty breathing.   You lose feeling or movement in your arms or legs.   You have slurred speech or difficulty talking, or you are unable to talk.  MAKE SURE YOU:    Understand these instructions.  Will watch your condition.  Will get help right away if you are not doing well or get worse.   This information is not intended to replace advice given to you by your health care provider. Make sure you discuss any questions you have with your health care provider.   Document Released: 11/25/2002 Document Revised: 12/10/2013 Document Reviewed: 09/27/2013 Elsevier Interactive Patient Education [employer].

## 2016-10-05 NOTE — Patient Instructions (Addendum)
Ms. Math,   It was a pleasure meeting you! Hope to see you again soon. Please do not hesitate to call or stop by if I can be of service to you.   There recommended amount of fiber for you is about 20-25 grams each day. Try to get a good portion of this from foods with soluble/viscous fiber as we discussed. Also, cooking foods high in fiber can help.   Butch Penny Toretto Tingler [phone]

## 2016-10-05 NOTE — Assessment & Plan Note (Addendum)
She continues to have presyncopal and syncopal episodes even after withdrawal of nearly all vasoactive medications.  Orthostatic vitals again positive today with no positional variation in heart rate and marked supine hypertension.  Combined with her long history of diabetes, peripheral neuropathy, and symptoms of flushing and GI irregularity, I think her syncopal episodes are orthostasis from autonomic dysfunction secondary to diabetic neuropathy.  Other etiologies, such as Parkinsonian disorders (MSA) were considered, but she has no tremor or bradykinesia.  Her syncopal episodes could also hypoglycemia, but she denies low CBGs when symptomatic and insulin has been held.  It will be challenging to manage her essential hypertension and orthostasis concurrently, but her syncopal episodes are significantly impairing her functionality and quality of life. -Compression stockings -Abdominal binder -Continue ample fluid intake -Check CBG whenever symptomatic -Consider fludrocortisone or midodrine if supine hypertension can be controlled

## 2016-10-05 NOTE — Progress Notes (Signed)
   CC: dizzy spells  HPI:  [name] is a 65 y.o. woman with history of DM2, HTN, and depression who presents for management of her hypertension and orthostatic hypotension.  She feels nausea, lightheaded, vision fades out, need to sit down.  Passed out 3-4x in last month, last time 2 weeks ago.  Not consistently postural or exertional, sometimes happens when she has been standing or working for a while. Also has had ~3x falls in last year without LOC, sometime without clear precipitant. Trying to drink one of gallon of water per day, thinks she is eating/drinking well.    Seen several times in September for management of HTN/orthostatic hypotension.  Her medications were sequentially discontinued to see if medications were causing her orthostasis.  At this point, she is not taking HCTZ, Lisinopril down to 10 mg, stopped Cymbalta and Gabapentin.  Glaucoma drops changed from timolol to latanaprost 3 weeks ago.  Occasionally has palpitations that are unrelated to her presyncopal episodes. Also has episodes of diaphoresis and flushing  Intermittent constipation/diarrhea, nausea, occasional premature satiety.   Also has had ~3x falls in last year, sometime without clear precipitant.  Diabetes since 1996, had diabetic retinopathy at diagnosis.   Past Medical History:  Diagnosis Date  . Allergy    SEASONAL  . Anemia   . Arthritis   . Cataract    PRESENT IN LEFT/ CATARACTS REMOVED FROM RIGHT  . Diabetes mellitus   . DKA (diabetic ketoacidoses) ([place]) 01/02/2013  . Hyperlipidemia   . Hypertension   . Hypotension   . Influenza A (H1N1) 01/03/2013  . Neuromuscular disorder ([place])    NEUROPATHY  . SYNCOPE 05/10/2010   Qualifier: Diagnosis of  By: Marilynne Halsted RN, BSN, Jacquelyn      Review of Systems: Review of Systems  Constitutional: Negative for chills and fever.  Respiratory: Negative for shortness of breath and wheezing.   Cardiovascular: Positive for leg swelling. Negative for  chest pain and orthopnea.  Musculoskeletal: Positive for falls.  Neurological: Positive for dizziness and loss of consciousness. Negative for tremors and focal weakness.    Physical Exam:  Vitals:   10/05/16 0821 10/05/16 0846 10/05/16 0847 10/05/16 0848  BP: (!) 201/85 (!) 170/76 (!) 155/70 112/64  Pulse: 86 85 85 86  Temp: 97.8 F (36.6 C)     TempSrc: Oral     SpO2: 100%     Weight: 145 lb 9.6 oz (66 kg)      Lying 170/76 85 Sitting 155/70 85 dizzy Standing 112/64 86  dizzy  Physical Exam  Constitutional: She appears well-developed and well-nourished. No distress.  Cardiovascular: Normal rate, regular rhythm and normal heart sounds.   Pulmonary/Chest: Effort normal and breath sounds normal.  Musculoskeletal:  Bilateral 1+ pitting edema to mid shin  Neurological:  Alert, oriented No resting, postural, or action tremor with finger to nose No bradykinesia Normal gait EOMI  Psychiatric: She has a normal mood and affect. Her behavior is normal.    Assessment & Plan:   See Encounters Tab for problem based charting.  Patient seen with Dr. Beryle Beams

## 2016-10-05 NOTE — Progress Notes (Signed)
  Medical Nutrition Therapy:  Appt start time: 1000 end time:  1100. Visit # 1  Assessment:  Primary concerns today: diabetes, irritable bowels Ms. Cordell has had diabetes since ~ 1996, She has had diabetes training both at hr doctor's offices and has gone to classes, but she couldn't remember where.  She cares for her husband, and helps care for mother and 2 sisters who do not live with her as well. She reports liking  vegetables and does the cooking and shopping.  Preferred Learning Style:No preference indicated  Learning Readiness: Contemplating  ANTHROPOMETRICS: weight-145#, height-61.5", BMI-27, IBW-108-116# , 126 %IBW WEIGHT HISTORY:118-153# over past 9 years SLEEP:930 Pm to 6AM with 1-2 awakenings for trips to bathroom and looks at [place]: metformin 1000mg  QAM LAbs: A1C 7.3% in August BLOOD SUGAR:reports her highest value is around lunch DIETARY INTAKE: Usual eating pattern includes 2 meals and 2 snacks per day. Everyday foods include cabbage, beans  Avoided foods include kidney beans.   24-hr recall:not done today per patient preference  Usual physical activity: walks 3-5 days a week for 30 minutes  Estimated daily energy needs: 1200-1500 calories for ~ 0.5 # weight loss per week 140-160 g carbohydrates (9-12 servings per day) 45-50 g protein   Progress Towards Goal(s):  In progress.   Nutritional Diagnosis:  NB-1.1 Food and nutrition-related knowledge deficit As related to lack of sufficient diabetes training.  As evidenced by her report and questions.    Intervention:  Nutrition education about high fiber diet, label readings, meal panning for diabetes, her lab work including results of last A1C, cholesterol and mmicroalbumion Coordination of care:   Teaching Method Utilized: Visual, Auditory,Hands on Handouts given during visit include: Carb counting and meal planning for diabetes, high fiber handout and food lists Barriers to learning/adherence to  lifestyle change: competing values, lack of material resources Demonstrated degree of understanding via:  Teach Back   Monitoring/Evaluation:  Dietary intake, exercise, meter, and body weight in 6 months to 1 year.

## 2016-10-05 NOTE — Assessment & Plan Note (Addendum)
Lab Results  Component Value Date   HGBA1C 7.3 08/15/2016   HGBA1C 7.4 04/26/2016   HGBA1C 7.2 11/24/2015   Current medications: 500 mg metformin BID  Insulin has been held since last visit, reports BGs at home are normally 100-200. 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  Assessment HgbA1c goal: <7 Blood Sugar control: Near goal  BGs tolerating withdrawal of insulin while working up syncopal episodes.  Plan Medications: Contine Metformin to 500 mg BID Insulin: Hold 6U glargine Other: Reassess glycemic control, insulin regimen, and syncopal symptoms at next visit.  Check BGs whenever she feels symptomatic.

## 2016-10-06 NOTE — Progress Notes (Signed)
Medicine attending: I personally interviewed and briefly examined this patient on the day of the patient visit and reviewed pertinent clinical ,laboratory, and radiographic data  with resident physician Dr. Minus Liberty and we discussed a management plan. 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 Significant orthostatic hypotension with hx of syncopal episodes. Discontinuation results in recurrent hypertension. Systolic BP 592.  No effective medial therapy that won't exacerbate her HTN (florinef or midodrine). We will prescribe Jobst garment elastic pressure stockings. Patient educated re not standing up too quickly and to use a cane or walker for support. We will resume her ACE inhibitor.

## 2016-10-14 ENCOUNTER — Telehealth: Payer: Self-pay | Admitting: Internal Medicine

## 2016-10-14 NOTE — Telephone Encounter (Signed)
APT. REMINDER CALL, LMTCB °

## 2016-10-17 ENCOUNTER — Ambulatory Visit: Payer: Self-pay

## 2016-10-19 ENCOUNTER — Ambulatory Visit (INDEPENDENT_AMBULATORY_CARE_PROVIDER_SITE_OTHER): Payer: Self-pay | Admitting: Internal Medicine

## 2016-10-19 VITALS — BP 148/85 | HR 80 | Temp 97.8°F | Wt 147.2 lb

## 2016-10-19 DIAGNOSIS — E785 Hyperlipidemia, unspecified: Secondary | ICD-10-CM

## 2016-10-19 DIAGNOSIS — E1142 Type 2 diabetes mellitus with diabetic polyneuropathy: Secondary | ICD-10-CM

## 2016-10-19 DIAGNOSIS — Z79899 Other long term (current) drug therapy: Secondary | ICD-10-CM

## 2016-10-19 DIAGNOSIS — Z7984 Long term (current) use of oral hypoglycemic drugs: Secondary | ICD-10-CM

## 2016-10-19 DIAGNOSIS — I10 Essential (primary) hypertension: Secondary | ICD-10-CM

## 2016-10-19 DIAGNOSIS — E1165 Type 2 diabetes mellitus with hyperglycemia: Secondary | ICD-10-CM

## 2016-10-19 DIAGNOSIS — I951 Orthostatic hypotension: Secondary | ICD-10-CM

## 2016-10-19 DIAGNOSIS — Z794 Long term (current) use of insulin: Secondary | ICD-10-CM

## 2016-10-19 MED ORDER — ATORVASTATIN CALCIUM 10 MG PO TABS: 10.0000 mg | ORAL_TABLET | Freq: Every day | ORAL | 3 refills | Status: DC

## 2016-10-19 MED ORDER — LISINOPRIL 20 MG PO TABS: 20.0000 mg | ORAL_TABLET | Freq: Every day | ORAL | 11 refills | Status: DC

## 2016-10-19 MED ORDER — METFORMIN HCL 500 MG PO TABS: 500.0000 mg | ORAL_TABLET | Freq: Two times a day (BID) | ORAL | 3 refills | Status: DC

## 2016-10-19 NOTE — Assessment & Plan Note (Signed)
Assessment Her blood sugars have trended mostly in the mid to low 100s. She has had no values over 200. Her only medications are metformin 500 mg twice daily.  Plan -Recommended she remain off of Lantus until she can follow-up -Advised earlier follow-up if she is having persistent blood sugars over 200 -Recommended she to have her blood sugars around 150 -Discontinued aspirin 81 mg in the absence of prior cardiac disease and the risk of GI bleeding

## 2016-10-19 NOTE — Assessment & Plan Note (Signed)
Assessment She would like a refill of her lisinopril 20 mg.  Plan -Refilled lisinopril 20 mg

## 2016-10-19 NOTE — Patient Instructions (Signed)
Please come back in 1 month to see your PCP for your diabetes just to make sure the sugars stay where they are.  I will call you about the results of your labwork today.

## 2016-10-19 NOTE — Assessment & Plan Note (Signed)
Assessment She would like a refill of atorvastatin 10 mg daily  Plan -Refilled atorvastatin 10 mg daily

## 2016-10-19 NOTE — Progress Notes (Signed)
   CC: Orthostatic hypotension  HPI:  [name] is a 65 y.o. female who presents today for orthostatic hypotension. Please see assessment & plan for status of chronic medical problems.  Past Medical History:  Diagnosis Date  . Allergy    SEASONAL  . Anemia   . Arthritis   . Cataract    PRESENT IN LEFT/ CATARACTS REMOVED FROM RIGHT  . Diabetes mellitus 1996  . DKA (diabetic ketoacidoses) ([place]) 01/02/2013  . Hyperlipidemia   . Hypertension   . Hypotension   . Influenza A (H1N1) 01/03/2013  . Neuromuscular disorder ([place])    NEUROPATHY  . SYNCOPE 05/10/2010   Qualifier: Diagnosis of  By: Marilynne Halsted RN, BSN, Jacquelyn      Review of Systems:  Please see each problem below for a pertinent review of systems.   Physical Exam:  Vitals:   10/19/16 0905  BP: (!) 148/85  Pulse: 80  Temp: 97.8 F (36.6 C)  TempSrc: Oral  SpO2: 100%  Weight: 147 lb 3.2 oz (66.8 kg)   Physical Exam  Constitutional: She is oriented to person, place, and time. No distress.  HENT:  Head: Normocephalic and atraumatic.  Eyes: Conjunctivae are normal. No scleral icterus.  Pulmonary/Chest: Effort normal. No respiratory distress.  Neurological: She is alert and oriented to person, place, and time. Gait normal.  Difficulty standing still with eyes closed after 5 seconds  Skin: Skin is warm and dry. She is not diaphoretic.    Assessment & Plan:   See Encounters Tab for problem based charting.  Patient discussed with Dr. Beryle Beams

## 2016-10-19 NOTE — Assessment & Plan Note (Addendum)
Assessment Since her last office visit, she has experienced one presyncopal episode daily. She does acknowledge more stress as she is the primary caregiver for her husband, younger sister, and mother was recently diagnosed with recurrent breast cancer. Over this interval, she has been checking her sugars and they have been trending mostly in the low to mid 100s. Her only value below 100 was this morning at 77. She has not checked her blood sugars in the middle of a presyncopal episode. She did attempt to look for leg stockings and an abdominal binder but was unable to afford the cost of either of them.  With these episodes, she feels some associated dizziness which she describes as a combination of lightheadedness and "the room spinning." She has also had an intermittent headache.   Orthostatic vital signs: -Supine 189/86, pulse 80 -Sitting upright 195/86, pulse 76 -Standing 132/72, pulse 83 -Standing after 3 minutes 129/71, pulse 86  I agree with her PCPs assessment that she likely has autonomic dysfunction. She has had diabetes for 21 years which is certainly contributory. We can also exclude other causes like amyloidosis, given that her most recent hemoglobin was 11.7 back in April 2015 and could not be completely accounted for by her renal function, and B12 deficiency.  Plan -Encouraged the patient that she is no longer having syncopal episodes which is promising. -Check vitamin B12 -Check serum/lambda light chains and IFE -Advised the patient that I will call her early next week with results -Follow-up with PCP in one month  ADDENDUM 10/20/2016  1:18 PM:  Vitamin B12 815, within normal limits.  ADDENDUM 10/20/2016  3:29 PM:  IgG kappa free light chain elevated at 36.3 with mildly elevated kappa/lambda ratio 1.66. IgG lambda free light chain level within the reference range of normal. We will await IFE though appears amyloidosis is less likely.  ADDENDUM 10/21/2016  5:42 PM:  Serum IFE  without abnormal immunofixation pattern. I feel confident excluding amyloidosis as a cause of her autonomic dysfunction.

## 2016-10-20 LAB — KAPPA/LAMBDA LIGHT CHAINS
Ig Kappa Free Light Chain: 36.3 mg/L — ABNORMAL HIGH (ref 3.3–19.4)
Ig Lambda Free Light Chain: 21.9 mg/L (ref 5.7–26.3)
Kappa/Lambda FluidC Ratio: 1.66 — ABNORMAL HIGH (ref 0.26–1.65)

## 2016-10-20 LAB — IMMUNOFIXATION, SERUM
IgA/Immunoglobulin A, Serum: 290 mg/dL (ref 87–352)
IgG (Immunoglobin G), Serum: 1008 mg/dL (ref 700–1600)
IgM (Immunoglobulin M), Srm: 88 mg/dL (ref 26–217)

## 2016-10-20 LAB — VITAMIN B12: Vitamin B-12: 815 pg/mL (ref 211–946)

## 2016-10-20 NOTE — Progress Notes (Signed)
Medicine attending: Medical history, presenting problems, physical findings, and medications, reviewed with resident physician Dr Rushil Patel on the day of the patient visit and I concur with his evaluation and management plan. 

## 2016-10-24 ENCOUNTER — Other Ambulatory Visit: Payer: Self-pay | Admitting: Family

## 2016-10-24 DIAGNOSIS — E785 Hyperlipidemia, unspecified: Secondary | ICD-10-CM

## 2016-10-24 NOTE — Telephone Encounter (Signed)
Rx was filled by correct PCP [facility]

## 2016-10-24 NOTE — Telephone Encounter (Signed)
Message routed to wrong PCP Please advise

## 2016-11-13 NOTE — Assessment & Plan Note (Addendum)
Lab Results  Component Value Date   HGBA1C 7.0 11/14/2016   HGBA1C 7.3 08/15/2016   HGBA1C 7.4 04/26/2016    Recent Labs  11/14/16 1602  GLUCAP 122*    Previously on Lantus 6U QHS, which was discontinued while working up recent syncopal episodes  Fasting BGs in the AM 140s.  Current medications: metformin 500 mg BID Current insulin: none  Assessment HgbA1c goal: <7.0 Glycemic control: at goal Complications: polyneuropathy, proliferative retinopathy, nephropathy  Plan Medications: continue current meds Insulin: none Other: -Ophthalmology appointment scheduled 12/21

## 2016-11-13 NOTE — Assessment & Plan Note (Addendum)
Orthostatic VS for the past 24 hrs:  BP- Lying Pulse- Lying BP- Sitting Pulse- Sitting BP- Standing at 0 minutes Pulse- Standing at 0 minutes  11/14/16 1602 180/80 87 174/76 82 117/62 79   Her symptoms of orthostasis have improved, with 1 mild presyncopal episode per week when she stands up fast. No falls or loss of consciousness.  She has been unable to afford compression stocking or abdominal binder.  Her orthostatic hypotension is consistent with autonomic dysfunction from her longstanding diabetes  Balancing management of her essential hypertension and orthostatic hypotension concurrently is challenging, but her syncopal episodes are dangerous and significantly impairing her functionality and quality of life.  Her supine hypertension may have to be tolerated in order to prevent falls.  If she wears compression stockings and orthostasis is improved, may be able to better control her hypertension.  -Recommend compression stockings -Continue ample fluid intake -Check CBG whenever symptomatic

## 2016-11-13 NOTE — Progress Notes (Signed)
   CC: sinus pain  HPI:  Amanda [name] is a 65 y.o. woman with history of DM2, HTN, and depression who presents for management of her diabetes.  Please see A&P for status of the patient's chronic medical conditions.    Past Medical History:  Diagnosis Date  . Allergy    SEASONAL  . Anemia   . Arthritis   . Cataract    PRESENT IN LEFT/ CATARACTS REMOVED FROM RIGHT  . Diabetes mellitus 1996  . DKA (diabetic ketoacidoses) ([place]) 01/02/2013  . Hyperlipidemia   . Hypertension   . Hypotension   . Influenza A (H1N1) 01/03/2013  . Neuromuscular disorder ([place])    NEUROPATHY  . SYNCOPE 05/10/2010   Qualifier: Diagnosis of  By: Marilynne Halsted RN, BSN, Jacquelyn      Review of Systems: Review of Systems  Constitutional: Negative for chills and fever.  HENT: Positive for congestion and sinus pain.   Eyes: Positive for redness.  Respiratory: Negative for cough and shortness of breath.   Musculoskeletal: Positive for back pain, joint pain and myalgias.  Neurological: Positive for headaches.    Physical Exam:  Vitals:   11/14/16 1515  BP: (!) 145/74  Pulse: 87  Temp: 98.7 F (37.1 C)  TempSrc: Oral  SpO2: 100%  Weight: 145 lb 1.6 oz (65.8 kg)  Height: 5' 1.5" (1.562 m)    Physical Exam  Constitutional: She is oriented to person, place, and time. She appears well-developed and well-nourished. No distress.  HENT:  Nose: Nose normal.  Mouth/Throat: Oropharynx is clear and moist. No oropharyngeal exudate.  Bilateral frontal and maxillary sinus tenderness  Eyes: Conjunctivae are normal. Right eye exhibits no discharge. Left eye exhibits no discharge.  Cardiovascular: Normal rate and regular rhythm.   Pulmonary/Chest: Effort normal and breath sounds normal.  Musculoskeletal:  Bilateral shoulders without deformity or tenderness. Active ROM full.  Neurological: She is alert and oriented to person, place, and time.  Psychiatric: She has a normal mood and affect. Her behavior is  normal.    Assessment & Plan:   See Encounters Tab for problem based charting.  Patient seen with Dr. Dr Dareen Piano

## 2016-11-13 NOTE — Assessment & Plan Note (Addendum)
BP Readings from Last 3 Encounters:  11/14/16 (!) 145/74  10/19/16 (!) 148/85  10/05/16 112/64   Lab Results  Component Value Date   CREATININE 1.05 (H) 08/15/2016   Lab Results  Component Value Date   K 4.8 08/15/2016   She has been taking Lisinopril 40 mg daily by accident rather than the 20 mg which was most recently prescribed.  Current medications: lisinopril 40 mg daily  Assessment BP goal: 140/90 BP control: complicated by autonomic dysfunction; uncontrolled supine hypertension and orthostatic hypotension  Plan Medications: continue current meds Other: -Recommend compression stockings

## 2016-11-14 ENCOUNTER — Ambulatory Visit (INDEPENDENT_AMBULATORY_CARE_PROVIDER_SITE_OTHER): Payer: Self-pay | Admitting: Internal Medicine

## 2016-11-14 ENCOUNTER — Encounter: Payer: Self-pay | Admitting: Internal Medicine

## 2016-11-14 ENCOUNTER — Other Ambulatory Visit: Payer: Self-pay | Admitting: Obstetrics and Gynecology

## 2016-11-14 VITALS — BP 145/74 | HR 87 | Temp 98.7°F | Ht 61.5 in | Wt 145.1 lb

## 2016-11-14 DIAGNOSIS — M542 Cervicalgia: Secondary | ICD-10-CM

## 2016-11-14 DIAGNOSIS — J0100 Acute maxillary sinusitis, unspecified: Secondary | ICD-10-CM

## 2016-11-14 DIAGNOSIS — M79602 Pain in left arm: Secondary | ICD-10-CM

## 2016-11-14 DIAGNOSIS — Z7984 Long term (current) use of oral hypoglycemic drugs: Secondary | ICD-10-CM

## 2016-11-14 DIAGNOSIS — Z794 Long term (current) use of insulin: Secondary | ICD-10-CM

## 2016-11-14 DIAGNOSIS — M25512 Pain in left shoulder: Secondary | ICD-10-CM

## 2016-11-14 DIAGNOSIS — Z1231 Encounter for screening mammogram for malignant neoplasm of breast: Secondary | ICD-10-CM

## 2016-11-14 DIAGNOSIS — E1121 Type 2 diabetes mellitus with diabetic nephropathy: Secondary | ICD-10-CM

## 2016-11-14 DIAGNOSIS — E1142 Type 2 diabetes mellitus with diabetic polyneuropathy: Secondary | ICD-10-CM

## 2016-11-14 DIAGNOSIS — E113599 Type 2 diabetes mellitus with proliferative diabetic retinopathy without macular edema, unspecified eye: Secondary | ICD-10-CM

## 2016-11-14 DIAGNOSIS — J3489 Other specified disorders of nose and nasal sinuses: Secondary | ICD-10-CM

## 2016-11-14 DIAGNOSIS — R05 Cough: Secondary | ICD-10-CM

## 2016-11-14 DIAGNOSIS — M79601 Pain in right arm: Secondary | ICD-10-CM

## 2016-11-14 DIAGNOSIS — I10 Essential (primary) hypertension: Secondary | ICD-10-CM

## 2016-11-14 DIAGNOSIS — M25511 Pain in right shoulder: Secondary | ICD-10-CM

## 2016-11-14 DIAGNOSIS — I951 Orthostatic hypotension: Secondary | ICD-10-CM

## 2016-11-14 DIAGNOSIS — R51 Headache: Secondary | ICD-10-CM

## 2016-11-14 DIAGNOSIS — Z79899 Other long term (current) drug therapy: Secondary | ICD-10-CM

## 2016-11-14 HISTORY — DX: Pain in left shoulder: M25.511

## 2016-11-14 LAB — POCT GLYCOSYLATED HEMOGLOBIN (HGB A1C): Hemoglobin A1C: 7

## 2016-11-14 LAB — GLUCOSE, CAPILLARY: Glucose-Capillary: 122 mg/dL — ABNORMAL HIGH (ref 65–99)

## 2016-11-14 MED ORDER — LISINOPRIL 40 MG PO TABS: 40.0000 mg | ORAL_TABLET | Freq: Every day | ORAL | 11 refills | Status: DC

## 2016-11-14 NOTE — Assessment & Plan Note (Signed)
For the past 1-2 weeks she has had rhinorrhea, cough, sneezing, sinus pressure, and intermittent bifrontal headaches.  She noticed her headaches ~3 days per week for several hours on those days.   7/10 in intensity, sharp, throbbing, ocasionally associated with mild nausea and photophobia.  Denies visual disturbances.  No headache history.  Has not been using Nasonex which has previously been prescribed her.  Will occasionally take 1 Extra Strength Tylenol which improves her headache.   Her symptoms are most consistent with acute/subacute sinusitis, especially with her history of nasal allergies.  She has used nasal corticosteroids to good effect previously, but has not been recently using them.  I think her headaches are from her sinus congestion; it is unlikely for her to develop new migraines in her 60s, and no red flag headache symptoms.  -Nasonex daily -Tylenol as needed for headaches, no more than 3000 mg daily -Stay hydrated

## 2016-11-14 NOTE — Patient Instructions (Signed)
For your sinus pressure and headache, start taking the Nasonex nasal spray once a day every day.  Drink plenty of water to stay hydrated.  For you arm and shoulder pains, take Naproxen twice a day for two weeks and see if that helps.  If things don't improve, please call for another appointment.  Your blood pressure and diabetes are doing well!  Keep taking Lisinopril 40 mg daily and Metformin 500 mg twice per day.  I'll see you in 3 months.

## 2016-11-14 NOTE — Assessment & Plan Note (Addendum)
Diffuse aching pain in her upper back, bilateral shoulders, and bilateral arms for last month.  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  She occasionally notes shooting pains down each arm.  No weakness, numbness, or parasthesias.  Most consistent with musculoskeletal pain.  Could also be cervical radiculopathy.  Will trial conservative management with NSAIDs first.  -Naproxen 500 mg BID for 2 weeks -RTC if no improvement -Continue MRI C-spine if persists, develops parasthesias, or weakness

## 2016-11-14 NOTE — Assessment & Plan Note (Deleted)
Sinus pressure, congestion, rhinorrhea, and dry/pruritic eyes consistent with her history of seasonal allergies.  She has used nasal corticosteroids to good effect previously, but has not been recently using them. -Nasonex daily

## 2016-11-15 LAB — BMP8+ANION GAP
Anion Gap: 16 mmol/L (ref 10.0–18.0)
BUN/Creatinine Ratio: 21 (ref 12–28)
BUN: 23 mg/dL (ref 8–27)
CO2: 24 mmol/L (ref 18–29)
Calcium: 9.1 mg/dL (ref 8.7–10.3)
Chloride: 102 mmol/L (ref 96–106)
Creatinine, Ser: 1.09 mg/dL — ABNORMAL HIGH (ref 0.57–1.00)
GFR calc Af Amer: 62 mL/min/{1.73_m2} (ref >59)
GFR calc non Af Amer: 54 mL/min/{1.73_m2} — ABNORMAL LOW (ref >59)
Glucose: 118 mg/dL — ABNORMAL HIGH (ref 65–99)
Potassium: 4.9 mmol/L (ref 3.5–5.2)
Sodium: 142 mmol/L (ref 134–144)

## 2016-11-16 NOTE — Progress Notes (Signed)
Internal Medicine Clinic Attending  I saw and evaluated the patient.  I personally confirmed the key portions of the history and exam documented by Dr. O'Sullivan and I reviewed pertinent patient test results.  The assessment, diagnosis, and plan were formulated together and I agree with the documentation in the resident's note.   

## 2016-11-26 ENCOUNTER — Encounter: Payer: Self-pay | Admitting: Internal Medicine

## 2016-12-08 DIAGNOSIS — H40053 Ocular hypertension, bilateral: Secondary | ICD-10-CM | POA: Insufficient documentation

## 2016-12-08 HISTORY — DX: Ocular hypertension, bilateral: H40.053

## 2016-12-15 ENCOUNTER — Ambulatory Visit (INDEPENDENT_AMBULATORY_CARE_PROVIDER_SITE_OTHER): Payer: Self-pay | Admitting: Internal Medicine

## 2016-12-15 ENCOUNTER — Encounter (HOSPITAL_COMMUNITY): Payer: Self-pay | Admitting: *Deleted

## 2016-12-15 ENCOUNTER — Ambulatory Visit
Admission: RE | Admit: 2016-12-15 | Discharge: 2016-12-15 | Disposition: A | Discharge status: Home / Self Care | Payer: No Typology Code available for payment source | Source: Ambulatory Visit | Attending: Obstetrics and Gynecology | Admitting: Obstetrics and Gynecology

## 2016-12-15 ENCOUNTER — Ambulatory Visit (HOSPITAL_COMMUNITY)
Admission: RE | Admit: 2016-12-15 | Discharge: 2016-12-15 | Disposition: A | Discharge status: Home / Self Care | Payer: Self-pay | Source: Ambulatory Visit | Attending: Obstetrics and Gynecology | Admitting: Obstetrics and Gynecology

## 2016-12-15 ENCOUNTER — Encounter: Payer: Self-pay | Admitting: Internal Medicine

## 2016-12-15 VITALS — BP 226/110 | Temp 98.2°F | Ht 61.5 in | Wt 149.0 lb

## 2016-12-15 VITALS — BP 198/88 | HR 86 | Temp 97.7°F | Ht 61.5 in | Wt 148.1 lb

## 2016-12-15 DIAGNOSIS — I10 Essential (primary) hypertension: Secondary | ICD-10-CM

## 2016-12-15 DIAGNOSIS — Z7984 Long term (current) use of oral hypoglycemic drugs: Secondary | ICD-10-CM

## 2016-12-15 DIAGNOSIS — Z79899 Other long term (current) drug therapy: Secondary | ICD-10-CM

## 2016-12-15 DIAGNOSIS — Z1239 Encounter for other screening for malignant neoplasm of breast: Secondary | ICD-10-CM

## 2016-12-15 DIAGNOSIS — Z1231 Encounter for screening mammogram for malignant neoplasm of breast: Secondary | ICD-10-CM

## 2016-12-15 DIAGNOSIS — E1143 Type 2 diabetes mellitus with diabetic autonomic (poly)neuropathy: Secondary | ICD-10-CM

## 2016-12-15 MED ORDER — HYDROCHLOROTHIAZIDE 25 MG PO TABS: 12.5000 mg | ORAL_TABLET | Freq: Every day | ORAL | 2 refills | Status: DC

## 2016-12-15 MED ORDER — HYDROCHLOROTHIAZIDE 12.5 MG PO CAPS: 12.5000 mg | ORAL_CAPSULE | Freq: Every day | ORAL | 2 refills | Status: DC

## 2016-12-15 MED FILL — HYDROCHLOROTHIAZIDE 12.5 MG: 12.5 | 30 days supply | Qty: 30 | Fill #0

## 2016-12-15 NOTE — Assessment & Plan Note (Signed)
BP uncontrolled at 198/88 here in the office. Repeat BP 185/84. She did not take her Lisinopril today which is likely partly why her BP is so elevated. We discussed the importance of taking BP meds daily even when going to doctor's appointments or procedures unless directed by her doctor. She was having episodes of orthostatic hypotension which is why her HCTZ was initially stopped but she has occasional episodes despite being off this medication. I think these episodes are more related to her autonomic dysfunction due to her poorly controlled diabetes. Will have her restart HCTZ at a low dose and see how she tolerates this medication. Prescription for  HCTZ 12.5 mg daily sent to [facility] ($4 list). Will have her record daily BPs at home (log given) and follow up in 1 week for BP recheck.

## 2016-12-15 NOTE — Patient Instructions (Signed)
General Instructions: - Please take Lisinopril 40 mg daily when you get home - Pick up prescription for Hydrochlorothiazide (HCTZ). You will take 12.5 mg (half tablet) daily.  - Follow up in 1 week to recheck blood pressure - Please check blood pressures at home and record them on paper. Bring this sheet to your next visit  Please bring your medicines with you each time you come to clinic.  Medicines may include prescription medications, over-the-counter medications, herbal remedies, eye drops, vitamins, or other pills.   Progress Toward Treatment Goals:  Treatment Goal 10/27/2015  Hemoglobin A1C at goal  Blood pressure at goal  Prevent falls -    Self Care Goals & Plans:  Self Care Goal 06/15/2016  Manage my medications take my medicines as prescribed; bring my medications to every visit; refill my medications on time  Monitor my health keep track of my blood pressure; bring my glucose meter and log to each visit; keep track of my blood glucose  Eat healthy foods eat more vegetables; eat foods that are low in salt; eat baked foods instead of fried foods  Be physically active find an activity I enjoy  Prevent falls wear appropriate shoes  Meeting treatment goals -    Home Blood Glucose Monitoring 10/27/2015  Check my blood sugar 2 times a day  When to check my blood sugar before breakfast; at bedtime     Care Management & Community Referrals:  Referral 10/27/2015  Referrals made for care management support none needed  Referrals made to community resources none

## 2016-12-15 NOTE — Patient Instructions (Addendum)
Explained breast self awareness to [name]. Patient did not need a Pap smear today due to a history of a hysterectomy for benign reasons. Let patient know that she doesn't need any further Pap smears due to her history of a hysterectomy for benign reasons. Referred patient to the [facility] for a mammogram. Appointment scheduled for Thursday, December 15, 2016 at 0920. Let patient know the [facility] will follow up with her within the next couple weeks with results of mammogram by letter or phone. Discussed with patient about her high blood pressure and importance of taking medications. Patient complained of a headache for the last three days and stated she is taking her prescription medications for her high BP. Referred patient to [facility] with her PCP to follow up for her high blood pressure in clinic this morning of 226/140 and 220/104 manually. Told patient that have scheduled her an appointment after her mammogram with her PCP. Told patient to go to appointment after mammogram is completed. [name] verbalized understanding.  Meganne Rita, Arvil Chaco, RN 9:23 AM

## 2016-12-15 NOTE — Progress Notes (Addendum)
No complaints today.   Pap Smear: Pap smear not completed today. Last Pap smear was 12/02/2003 and normal. Per patient has no history of an abnormal Pap smear. Patient has a history of a hysterectomy 10-15 years ago due to AUB. Patient no longer needs Pap smears per BCCCP and ACOG due to her history of a hysterectomy for benign reasons. Last Pap smear result is in EPIC.  Physical exam: Breasts Breasts symmetrical. No skin abnormalities bilateral breasts. No nipple retraction bilateral breasts. No nipple discharge bilateral breasts. No lymphadenopathy. No lumps palpated bilateral breasts. Complaints of slight left outer breast tenderness on exam. Referred patient to the [facility] for a mammogram. Appointment scheduled for Thursday, December 15, 2016 at 0920.        Pelvic/Bimanual No Pap smear completed today since patient has a history of a hysterectomy for benign reasons. Pap smear not indicated per BCCCP guidelines.   Smoking History: Patient has never smoked.  Patient Navigation: Patient education provided. Access to services provided for patient through [facility] program.   Colorectal Cancer Screening: Per patient had a colonoscopy completed around 3 years ago. No complaints today.

## 2016-12-15 NOTE — Progress Notes (Signed)
   CC: High blood pressure  HPI:  [name] is a 65 y.o. woman with PMHx as noted below who presents today for follow up of her hypertension.   She went to the breast center this morning for a mammogram and had her vitals checked when she was notified that her BP was elevated at 212/108. She reports she has been taking her Lisinopril 40 mg daily without any issues. However, she did not take her medication this morning. She reports there were concerns about her BP dropping about 1 month ago when she was seen in clinic. She reports she used to be on a diuretic but this had been stopped too. Per review of records, appears she was having orthostatic hypotension complicated by autonomic dysfunction due to her diabetes so HCTZ had been stopped. She was recommended to use compression stockings but she was not able to get these due to cost. She reports an associated headache but denies chest pain, weakness, changes in vision or speech. She has chronic numbness/tingling in her hands and legs due to her diabetes.   Past Medical History:  Diagnosis Date  . Allergy    SEASONAL  . Anemia   . Arthritis   . Cataract    PRESENT IN LEFT/ CATARACTS REMOVED FROM RIGHT  . Diabetes mellitus 1996  . DKA (diabetic ketoacidoses) ([place]) 01/02/2013  . Hyperlipidemia   . Hypertension   . Hypotension   . Influenza A (H1N1) 01/03/2013  . Neuromuscular disorder ([place])    NEUROPATHY  . SYNCOPE 05/10/2010   Qualifier: Diagnosis of  By: Marilynne Halsted, RN, BSN, Jacquelyn      Review of Systems:  All negative except per HPI  Physical Exam:  Vitals:   12/15/16 0924  BP: (!) 198/88  Pulse: 86  Temp: 97.7 F (36.5 C)  TempSrc: Oral  SpO2: 100%  Weight: 148 lb 1.6 oz (67.2 kg)  Height: 5' 1.5" (1.562 m)   Repeat BP 185/84  General: alert, sitting up, pleasant, NAD HEENT: [place]/AT, EOMI, sclera anicteric, mucus membranes moist CV: RRR, no m/g/r Pulm: CTA bilaterally, breaths non-labored Ext: warm, no  edema Neuro: alert and oriented x 3. Smile symmetric. Tongue midline. Shoulder shrug intact. Strength 5/5 in all extremities. Sensation symmetric and intact.  Assessment & Plan:   See Encounters Tab for problem based charting.  Patient discussed with Dr. Lynnae January

## 2016-12-15 NOTE — Addendum Note (Signed)
Encounter addended by: Loletta Parish, RN on: 12/15/2016  9:33 AM<BR>    Actions taken: Sign clinical note

## 2016-12-20 ENCOUNTER — Ambulatory Visit (INDEPENDENT_AMBULATORY_CARE_PROVIDER_SITE_OTHER): Payer: Self-pay | Admitting: Internal Medicine

## 2016-12-20 VITALS — BP 138/73 | HR 85 | Temp 98.0°F | Ht 61.5 in | Wt 148.3 lb

## 2016-12-20 DIAGNOSIS — Z79899 Other long term (current) drug therapy: Secondary | ICD-10-CM

## 2016-12-20 DIAGNOSIS — J069 Acute upper respiratory infection, unspecified: Secondary | ICD-10-CM | POA: Insufficient documentation

## 2016-12-20 DIAGNOSIS — B9789 Other viral agents as the cause of diseases classified elsewhere: Secondary | ICD-10-CM

## 2016-12-20 DIAGNOSIS — I10 Essential (primary) hypertension: Secondary | ICD-10-CM

## 2016-12-20 NOTE — Assessment & Plan Note (Addendum)
Patient was seen on 12/28 with significantly elevated blood pressure in the setting of having missed her daily lisinopril. She was also noted to recently stopped her HCTZ for concern orthostatic hypotension. Chart review indicates that she has likely autonomic dysfunction related to long-standing diabetes mellitus. Dr. Arcelia Jew on last exam felt that her orthostasis was most likely related to autonomic dysfunction rather than excessive diuresis and HCTZ was restarted on low-dose 12.5 mg. Patient presents today for follow-up of blood pressure and titration of medications as appropriate.  Patient presents with blood pressure log which demonstrates some decent control at 130s/80s but occasionally uncontrolled throughout the day with blood pressures ranging from 370-488 systolic. As diabetic her blood pressure target is 130/80. She has not had any symptoms of orthostasis / lightheadedness on the HCTZ and I would like to increase this to 25 mg daily and reassess blood pressure.  I'll plan to follow-up with her by phone in one to 2 weeks to assess blood pressure control and titrate medications accordingly. If blood pressures are well controlled on this regimen we will have her follow-up with her PCP in 3 months.

## 2016-12-20 NOTE — Patient Instructions (Addendum)
Your blood pressure is very good today at 138/73, however appears that it has been elevated at home with blood pressure still in the 150s and 170s. I would like to increase your dose of HCTZ from 12.5 mg to 25 mg every day. These take 2 tablets daily starting on Sunday, January 7 and record her blood pressures. I will plan to call you on January 11 or 12 to check how well this is been working. If he should notice lightheadedness or dizziness please call the clinic back as this may be a sign of dehydration related to the increased dose of your medicine.  For your upper respiratory infection, I would recommend taking an over-the-counter medication which has 2 primary active ingredients. First is guaifenesin which will be helpful for your congestion, and the second is dextromethorphan which will be helpful for your cough.

## 2016-12-20 NOTE — Assessment & Plan Note (Signed)
Patient presents with symptoms of viral upper respiratory tract infection including cough, sore throat, nasal congestion, rhinorrhea, ear pain. There are no indications of bacterial infection at this time and exam is benign without middle ear effusion, only mildly edematous nasal turbinates and mild erythema without exudates in the oropharynx.  I recommended symptomatically management with over-the-counter remedies and cost her on using guaifenesin and dextromethorphan. I recommended that if her symptoms do not improve in the next 1-2 weeks she should follow-up with [place] for reevaluation.

## 2016-12-20 NOTE — Progress Notes (Signed)
CC: BP check HPI: Amanda [name] is a 66 y.o. female with a h/o of HTN, autonomic dysfunction 2/2 DM, HLD who presents for f/u of HTN after medication adjustment 1 week ago  Please see Problem-based charting for HPI and the status of patient's chronic medical conditions.  Past Medical History:  Diagnosis Date  . Allergy    SEASONAL  . Anemia   . Arthritis   . Cataract    PRESENT IN LEFT/ CATARACTS REMOVED FROM RIGHT  . Diabetes mellitus 1996  . DKA (diabetic ketoacidoses) ([place]) 01/02/2013  . Hyperlipidemia   . Hypertension   . Hypotension   . Influenza A (H1N1) 01/03/2013  . Neuromuscular disorder ([place])    NEUROPATHY  . SYNCOPE 05/10/2010   Qualifier: Diagnosis of  By: Marilynne Halsted RN, BSN, Jacquelyn      Social Hx: Social History  Substance Use Topics  . Smoking status: Never Smoker  . Smokeless tobacco: Never Used  . Alcohol use No   Review of Systems: ROS in HPI. Otherwise: Review of Systems  Constitutional: Positive for malaise/fatigue. Negative for chills, fever and weight loss.  HENT: Positive for congestion, ear pain, sinus pain and sore throat. Negative for ear discharge and nosebleeds.   Eyes: Negative for blurred vision.  Respiratory: Negative for cough, hemoptysis, sputum production, shortness of breath, wheezing and stridor.   Cardiovascular: Negative for chest pain and leg swelling.  Gastrointestinal: Positive for nausea. Negative for abdominal pain, constipation, diarrhea and vomiting.  Genitourinary: Negative for dysuria, frequency and urgency.  Musculoskeletal: Positive for myalgias.    Physical Exam: Vitals:   12/20/16 1533  BP: 138/73  Pulse: 85  Temp: 98 F (36.7 C)  TempSrc: Oral  SpO2: 100%  Weight: 148 lb 4.8 oz (67.3 kg)  Height: 5' 1.5" (1.562 m)   Physical Exam  Constitutional: She appears well-developed. She is cooperative. No distress.  HENT:  Head: Normocephalic.  Right Ear: Tympanic membrane and ear canal normal.  Left Ear:  Tympanic membrane and ear canal normal.  Nose: Mucosal edema and rhinorrhea present.  Mouth/Throat: Posterior oropharyngeal erythema present. No oropharyngeal exudate or posterior oropharyngeal edema. No tonsillar exudate.  Cardiovascular: Normal rate, regular rhythm, normal heart sounds and normal pulses.  Exam reveals no gallop.   No murmur heard. Pulmonary/Chest: Effort normal and breath sounds normal. No respiratory distress. She has no wheezes. She has no rhonchi. She has no rales. Breasts are symmetrical.  Abdominal: Soft. Bowel sounds are normal. There is no tenderness.  Musculoskeletal: She exhibits no edema.    Assessment & Plan:  See encounters tab for problem based medical decision making. Patient seen with Dr. Lynnae January  Essential hypertension, benign Patient was seen on 12/28 with significantly elevated blood pressure in the setting of having missed her daily lisinopril. She was also noted to recently stopped her HCTZ for concern orthostatic hypotension. Chart review indicates that she has likely autonomic dysfunction related to long-standing diabetes mellitus. Dr. Arcelia Jew on last exam felt that her orthostasis was most likely related to autonomic dysfunction rather than excessive diuresis and HCTZ was restarted on low-dose 12.5 mg. Patient presents today for follow-up of blood pressure and titration of medications as appropriate.  Patient presents with blood pressure log which demonstrates some decent control at 130s/80s but occasionally uncontrolled throughout the day with blood pressures ranging from 643-329 systolic. As diabetic her blood pressure target is 130/80. She has not had any symptoms of orthostasis / lightheadedness on the HCTZ and I would  like to increase this to 25 mg daily and reassess blood pressure.  I'll plan to follow-up with her by phone in one to 2 weeks to assess blood pressure control and titrate medications accordingly. If blood pressures are well controlled on  this regimen we will have her follow-up with her PCP in 3 months.  Viral URI with cough Patient presents with symptoms of viral upper respiratory tract infection including cough, sore throat, nasal congestion, rhinorrhea, ear pain. There are no indications of bacterial infection at this time and exam is benign without middle ear effusion, only mildly edematous nasal turbinates and mild erythema without exudates in the oropharynx.  I recommended symptomatically management with over-the-counter remedies and cost her on using guaifenesin and dextromethorphan. I recommended that if her symptoms do not improve in the next 1-2 weeks she should follow-up with [place] for reevaluation.   Signed: Holley Raring, MD 12/20/2016, 4:44 PM  Pager: [phone]

## 2016-12-20 NOTE — Progress Notes (Signed)
Internal Medicine Clinic Attending  Case discussed with Dr. Rivet at the time of the visit.  We reviewed the resident's history and exam and pertinent patient test results.  I agree with the assessment, diagnosis, and plan of care documented in the resident's note.  

## 2016-12-23 ENCOUNTER — Encounter (HOSPITAL_COMMUNITY): Payer: Self-pay | Admitting: *Deleted

## 2016-12-23 NOTE — Progress Notes (Signed)
Internal Medicine Clinic Attending  Case discussed with Dr. Strelow at the time of the visit.  We reviewed the resident's history and exam and pertinent patient test results.  I agree with the assessment, diagnosis, and plan of care documented in the resident's note.  

## 2017-01-17 MED FILL — HYDROCHLOROTHIAZIDE 12.5 MG: 12.5 | 30 days supply | Qty: 30 | Fill #1

## 2017-01-26 MED FILL — AMOXICILLIN 500 MG CAPSULE: 500 | 7 days supply | Qty: 21 | Fill #0

## 2017-01-28 ENCOUNTER — Ambulatory Visit (HOSPITAL_COMMUNITY)
Admission: EM | Admit: 2017-01-28 | Discharge: 2017-01-28 | Disposition: A | Discharge status: Home / Self Care | Payer: No Typology Code available for payment source | Attending: Internal Medicine | Admitting: Internal Medicine

## 2017-01-28 ENCOUNTER — Encounter (HOSPITAL_COMMUNITY): Payer: Self-pay | Admitting: *Deleted

## 2017-01-28 DIAGNOSIS — R6889 Other general symptoms and signs: Secondary | ICD-10-CM

## 2017-01-28 DIAGNOSIS — R197 Diarrhea, unspecified: Secondary | ICD-10-CM

## 2017-01-28 DIAGNOSIS — R112 Nausea with vomiting, unspecified: Secondary | ICD-10-CM

## 2017-01-28 DIAGNOSIS — K529 Noninfective gastroenteritis and colitis, unspecified: Secondary | ICD-10-CM

## 2017-01-28 LAB — POCT URINALYSIS DIP (DEVICE)
Glucose, UA: 500 mg/dL — AB
Ketones, ur: NEGATIVE mg/dL
Leukocytes, UA: NEGATIVE
Nitrite: NEGATIVE
Protein, ur: >=300 mg/dL — AB
Specific Gravity, Urine: 1.025 (ref 1.005–1.030)
Urobilinogen, UA: 0.2 mg/dL (ref 0.0–1.0)
pH: 5 (ref 5.0–8.0)

## 2017-01-28 MED ORDER — ONDANSETRON 4 MG PO TBDP
ORAL_TABLET | ORAL | Status: AC
  Filled 2017-01-28: qty 1

## 2017-01-28 MED ORDER — ONDANSETRON 4 MG PO TBDP
4.0000 mg | ORAL_TABLET | Freq: Once | ORAL | Status: AC
  Administered 2017-01-28: 4 mg via ORAL

## 2017-01-28 MED ORDER — TRAMADOL-ACETAMINOPHEN 37.5-325 MG PO TABS: 1.0000 | ORAL_TABLET | Freq: Four times a day (QID) | ORAL | 0 refills | Status: DC | PRN

## 2017-01-28 NOTE — ED Provider Notes (Signed)
CSN: [id number]     Arrival date & time 01/28/17  1538 History   First MD Initiated Contact with Patient 01/28/17 1744     Chief Complaint  Patient presents with  . Emesis   (Consider location/radiation/quality/duration/timing/severity/associated sxs/prior Treatment) 66 year old female complaining of body aches and muscle aches all over this started at 1:00 this morning. She has had 5 episodes of vomiting throughout the day with the last episode at 2 PM, 4 hours ago. She also has a cough, runny nose, right earache and pain across the lower abdomen. She is had a few episodes of diarrhea. Denies sore throat. She does complain of occasional mild dysuria. She received a flu shot this season.      Past Medical History:  Diagnosis Date  . Allergy    SEASONAL  . Anemia   . Arthritis   . Cataract    PRESENT IN LEFT/ CATARACTS REMOVED FROM RIGHT  . Diabetes mellitus 1996  . DKA (diabetic ketoacidoses) ([place]) 01/02/2013  . Hyperlipidemia   . Hypertension   . Hypotension   . Influenza A (H1N1) 01/03/2013  . Neuromuscular disorder ([place])    NEUROPATHY  . SYNCOPE 05/10/2010   Qualifier: Diagnosis of  By: Marilynne Halsted RN, BSN, Jacquelyn     Past Surgical History:  Procedure Laterality Date  . ABDOMINAL HYSTERECTOMY     10 years ago   . Cataracts    . ROTATOR CUFF REPAIR    . SHOULDER SURGERY     Rotator cuff tear    Family History  Problem Relation Age of Onset  . Diabetes Mother   . Hypertension Mother   . Breast cancer Mother   . Aneurysm Father   . Diabetes Sister   . Hypertension Sister   . Kidney disease Sister     One Kidney transplant, one sister on HD  . Breast cancer Sister    Social History  Substance Use Topics  . Smoking status: Never Smoker  . Smokeless tobacco: Never Used  . Alcohol use No   OB History    Gravida Para Term Preterm AB Living   1 1 1     1    SAB TAB Ectopic Multiple Live Births                 Review of Systems  Constitutional: Positive for  activity change, appetite change, fatigue and fever. Negative for chills.  HENT: Positive for congestion, ear pain, postnasal drip and rhinorrhea. Negative for facial swelling and sore throat.   Eyes: Negative.   Respiratory: Positive for cough. Negative for shortness of breath.   Cardiovascular: Negative.   Gastrointestinal: Positive for abdominal pain, diarrhea and vomiting.       Points to the lower abdomen.  Genitourinary: Positive for dysuria. Negative for urgency.  Musculoskeletal: Negative for neck pain and neck stiffness.  Skin: Negative for pallor and rash.  Neurological: Positive for headaches.    Allergies  Gabapentin  Home Medications   Prior to Admission medications   Medication Sig Start Date End Date Taking? Authorizing Provider  amoxicillin (AMOXIL) 500 MG capsule Take 500 mg by mouth 3 (three) times daily.   Yes Historical Provider, MD  aspirin EC 81 MG tablet Take 81 mg by mouth daily.   Yes Historical Provider, MD  hydrochlorothiazide (HYDRODIURIL) 25 MG tablet Take 0.5 tablets (12.5 mg total) by mouth daily. 12/15/16  Yes Carly J Rivet, MD  latanoprost (XALATAN) 0.005 % ophthalmic solution 1 drop at bedtime.  Yes Historical Provider, MD  lisinopril (PRINIVIL,ZESTRIL) 40 MG tablet Take 1 tablet (40 mg total) by mouth daily. 11/14/16 11/14/17 Yes Minus Liberty, MD  metFORMIN (GLUCOPHAGE) 500 MG tablet Take 1 tablet (500 mg total) by mouth 2 (two) times daily with a meal. 10/19/16  Yes Rushil Sherrye Payor, MD  mometasone (NASONEX) 50 MCG/ACT nasal spray Place 2 sprays into the nose daily. 08/25/16 08/25/17 Yes Asencion Partridge, MD  mometasone (NASONEX) 50 MCG/ACT nasal spray Place 2 sprays into the nose daily.   Yes Historical Provider, MD  acetaminophen (TYLENOL) 325 MG tablet Take 2 tablets (650 mg total) by mouth every 6 (six) hours as needed (or Fever >/= 101). 01/05/13   Dorian Heckle, MD  Capsaicin-Menthol-Methyl Sal (CAPSAICIN-METHYL SAL-MENTHOL) 0.025-1-12 % CREA Apply  over painful areas 02/08/16   Corky Sox, MD  Cholecalciferol (VITAMIN D3) 2000 units capsule Take by mouth.    Historical Provider, MD  diclofenac sodium (VOLTAREN) 1 % GEL Apply 4 g topically 4 (four) times daily. 09/08/16   Lorella Nimrod, MD  dorzolamide-timolol (COSOPT) 22.3-6.8 MG/ML ophthalmic solution  06/16/16   Historical Provider, MD  fexofenadine (ALLEGRA ALLERGY) 180 MG tablet Take 1 tablet (180 mg total) by mouth daily. 09/15/16   Lorella Nimrod, MD  meloxicam (MOBIC) 7.5 MG tablet Take 1 tablet (7.5 mg total) by mouth daily. 04/26/16 04/26/17  Liberty Handy, MD  Multiple Vitamins-Minerals (WOMENS 50+ MULTI VITAMIN/MIN) TABS Take 1 tablet by mouth daily. 03/18/14   Lucious Groves, DO  naproxen (NAPROSYN) 500 MG tablet Take 1 tablet (500 mg total) by mouth 2 (two) times daily with a meal. 09/08/16   Lorella Nimrod, MD  senna (SENOKOT) 8.6 MG TABS tablet Take 1 tablet (8.6 mg total) by mouth daily as needed for mild constipation. 08/25/16   Asencion Partridge, MD  simethicone (GAS-X) 80 MG chewable tablet Chew 1 tablet (80 mg total) by mouth 4 (four) times daily as needed for flatulence. 06/02/14 06/02/15  Corky Sox, MD  traMADol-acetaminophen (ULTRACET) 37.5-325 MG tablet Take 1 tablet by mouth every 6 (six) hours as needed. 01/28/17   Janne Napoleon, NP   Meds Ordered and Administered this Visit   Medications  ondansetron (ZOFRAN-ODT) disintegrating tablet 4 mg (4 mg Oral Given 01/28/17 1811)    BP 146/77   Pulse 106   Temp 100.6 F (38.1 C) (Oral)   Resp 18   SpO2 98%  No data found.   Physical Exam  Constitutional: She is oriented to person, place, and time. She appears well-developed and well-nourished. No distress.  HENT:  Right Ear: External ear normal.  Left Ear: External ear normal.  Bilateral TMs are normal. Oropharynx with minor erythema no exudates or swelling.  Eyes: EOM are normal. Pupils are equal, round, and reactive to light.  Neck: Normal range of motion. Neck supple.   Cardiovascular: Normal rate, regular rhythm, normal heart sounds and intact distal pulses.   Pulmonary/Chest: Effort normal and breath sounds normal. No respiratory distress. She has no wheezes.  Abdominal: Bowel sounds are normal. She exhibits no distension. There is no rebound and no guarding.  Abdomen with minor tenderness across the lower most abdomen left and right lower quadrants.  Musculoskeletal: Normal range of motion. She exhibits no edema.  Lymphadenopathy:    She has no cervical adenopathy.  Neurological: She is alert and oriented to person, place, and time. No cranial nerve deficit.  Skin: Skin is warm and dry. No rash noted.  Psychiatric: She has a  normal mood and affect.  Nursing note and vitals reviewed.   Urgent Care Course     Procedures (including critical care time)  Labs Review Labs Reviewed  POCT URINALYSIS DIP (DEVICE) - Abnormal; Notable for the following:       Result Value   Glucose, UA 500 (*)    Bilirubin Urine SMALL (*)    Hgb urine dipstick MODERATE (*)    Protein, ur >=300 (*)    All other components within normal limits    Imaging Review No results found.   Visual Acuity Review  Right Eye Distance:   Left Eye Distance:   Bilateral Distance:    Right Eye Near:   Left Eye Near:    Bilateral Near:         MDM   1. Nausea vomiting and diarrhea   2. Gastroenteritis   3. Flu-like symptoms    This patient has made of upper respiratory symptoms, influenza-like symptoms and gastroenteritis. It started suddenly early this morning. She is a little dehydrated. We will treat with antiemetics, drink plenty of fluids and try to hydrate at home. Sudafed PE 10 mg every 4 to 6 hours as needed for congestion Allegra or Zyrtec daily as needed for drainage and runny nose. For stronger antihistamine may take Chlor-Trimeton 2 to 4 mg every 4 to 6 hours, may cause drowsiness. Saline nasal spray used frequently. Tylenol every 4 hours as needed for  pain, discomfort or fever. Drink plenty of fluids and stay well-hydrated. Robitussin DM for cough. Meds ordered this encounter  Medications  . latanoprost (XALATAN) 0.005 % ophthalmic solution    Sig: 1 drop at bedtime.  [name] amoxicillin (AMOXIL) 500 MG capsule    Sig: Take 500 mg by mouth 3 (three) times daily.  [name] aspirin EC 81 MG tablet    Sig: Take 81 mg by mouth daily.  . mometasone (NASONEX) 50 MCG/ACT nasal spray    Sig: Place 2 sprays into the nose daily.  . ondansetron (ZOFRAN-ODT) disintegrating tablet 4 mg  . traMADol-acetaminophen (ULTRACET) 37.5-325 MG tablet    Sig: Take 1 tablet by mouth every 6 (six) hours as needed.    Dispense:  15 tablet    Refill:  0    Order Specific Question:   Supervising Provider    Answer:   Sherlene Shams [[id number]]       Janne Napoleon, NP 01/28/17 1850

## 2017-01-28 NOTE — ED Triage Notes (Signed)
Started amoxicillin yesterday for dental infection.

## 2017-01-28 NOTE — Discharge Instructions (Signed)
Sudafed PE 10 mg every 4 to 6 hours as needed for congestion Allegra or Zyrtec daily as needed for drainage and runny nose. For stronger antihistamine may take Chlor-Trimeton 2 to 4 mg every 4 to 6 hours, may cause drowsiness. Saline nasal spray used frequently. Tylenol every 4 hours as needed for pain, discomfort or fever. Drink plenty of fluids and stay well-hydrated. Robitussin DM for cough.

## 2017-01-28 NOTE — ED Triage Notes (Signed)
C/O generalized pain, aches, vomiting, diarrhea, abd discomfort, cough since 0100 this AM.

## 2017-02-06 ENCOUNTER — Encounter: Payer: Self-pay | Admitting: Internal Medicine

## 2017-02-06 ENCOUNTER — Ambulatory Visit (INDEPENDENT_AMBULATORY_CARE_PROVIDER_SITE_OTHER): Payer: Self-pay | Admitting: Internal Medicine

## 2017-02-06 ENCOUNTER — Encounter (INDEPENDENT_AMBULATORY_CARE_PROVIDER_SITE_OTHER): Payer: Self-pay

## 2017-02-06 DIAGNOSIS — Z7984 Long term (current) use of oral hypoglycemic drugs: Secondary | ICD-10-CM

## 2017-02-06 DIAGNOSIS — Z79899 Other long term (current) drug therapy: Secondary | ICD-10-CM

## 2017-02-06 DIAGNOSIS — E1142 Type 2 diabetes mellitus with diabetic polyneuropathy: Secondary | ICD-10-CM

## 2017-02-06 DIAGNOSIS — B9789 Other viral agents as the cause of diseases classified elsewhere: Principal | ICD-10-CM

## 2017-02-06 DIAGNOSIS — E1165 Type 2 diabetes mellitus with hyperglycemia: Secondary | ICD-10-CM

## 2017-02-06 DIAGNOSIS — Z794 Long term (current) use of insulin: Secondary | ICD-10-CM

## 2017-02-06 DIAGNOSIS — J069 Acute upper respiratory infection, unspecified: Secondary | ICD-10-CM

## 2017-02-06 DIAGNOSIS — I10 Essential (primary) hypertension: Secondary | ICD-10-CM

## 2017-02-06 NOTE — Assessment & Plan Note (Signed)
Patient states that her glucose has recently been more elevated in the 200s. She reports compliance with her metformin 500 mg twice a day. She does report increased sugary drinks such as Gatorade recently given her recent illness. Her last A1c was 7.0 in November 2017.  Her recent elevated glucose is likely secondary to recent illness and sugary drink intake.  Plan: -Continue metformin 500 mg twice a day with meals -Patient is to call the clinic or follow-up sooner if her glucose remains elevated despite returning to her normal diet

## 2017-02-06 NOTE — Progress Notes (Signed)
Case discussed with Dr. Quay Burow soon after the resident saw the patient. We reviewed the resident's history and exam and pertinent patient test results. I agree with the assessment, diagnosis, and plan of care documented in the resident's note.

## 2017-02-06 NOTE — Assessment & Plan Note (Signed)
Patient was recently seen in urgent care on 01/28/2017. At that time, she complained of chills, body aches, nausea, vomiting, diarrhea, cough, nasal congestion, earache, mild lower abdominal pain. She also had mild dysuria. She had received her flu shot earlier this year. The urgent care checked a urinalysis which was negative for infection. It felt her symptoms are most consistent with a viral illness. She was prescribed nasal spray, Zyrtec, Tylenol, given a dose of Zofran in the urgent care, and Ultracet for pain. Today, patient states that she is feeling much better. She continues to have some nasal congestion and a mild productive cough and occasional aches, but overall feels much better. She continues to take Tylenol and DayQuil as needed.  Assessment: Resolving URI  Plan: -Continue nasal spray -Continue allergy medication -Continue Tylenol as needed

## 2017-02-06 NOTE — Progress Notes (Signed)
CC: Follow-up for acute URI  HPI: [name] is a 66 y.o. female with PMHx of diabetes, hypertension who presents to the clinic for follow-up from urgent care for viral illness.  Patient was recently seen in urgent care on 01/28/2017. At that time, she complained of chills, body aches, nausea, vomiting, diarrhea, cough, nasal congestion, earache, mild lower abdominal pain. She also had mild dysuria. She had received her flu shot earlier this year. The urgent care checked a urinalysis which was negative for infection. It felt her symptoms are most consistent with a viral illness. She was prescribed nasal spray, Zyrtec, Tylenol, given a dose of Zofran in the urgent care, and Ultracet for pain. Today, patient states that she is feeling much better. She continues to have some nasal congestion and a mild productive cough and occasional aches, but overall feels much better. She continues to take Tylenol and DayQuil as needed.  Patient states that her glucose has recently been more elevated in the 200s. She reports compliance with her metformin 500 mg twice a day. She does report increased sugary drinks such as Gatorade recently given her recent illness. Her last A1c was 7.0 in November 2017.  Blood pressure initially was 158/82. On repeat her blood pressure was 135/82. She shows me her blood pressure log from home which shows good control in the 110 to 120s over 80s. She reports compliance with her lisinopril 40 mg once a day and hydrochlorothiazide 12.5 mg once a day. There have been questionable whether or not her hydrochlorothiazide 12.5 mg per day should be increased to 25 mg once a day. However, patient does have a history of orthostatic hypotension which has been attributed to autonomic dysfunction in the past. Given her relatively good control today and recent illness, I will not make changes to this medication. Patient is to follow-up with her PCP in one month.   Past Medical History:    Diagnosis Date  . Allergy    SEASONAL  . Anemia   . Arthritis   . Cataract    PRESENT IN LEFT/ CATARACTS REMOVED FROM RIGHT  . Diabetes mellitus 1996  . DKA (diabetic ketoacidoses) ([place]) 01/02/2013  . Hyperlipidemia   . Hypertension   . Hypotension   . Influenza A (H1N1) 01/03/2013  . Neuromuscular disorder ([place])    NEUROPATHY  . SYNCOPE 05/10/2010   Qualifier: Diagnosis of  By: Marilynne Halsted RN, BSN, Jacquelyn      Review of Systems: Please see pertinent ROS reviewed in HPI and problem based charting.   Physical Exam: Vitals:   02/06/17 0952 02/06/17 0954  BP: (!) 158/82 135/82  Pulse: 81   Temp: 98 F (36.7 C)   TempSrc: Oral   SpO2: 100%   Weight: 145 lb 8 oz (66 kg)    General: Vital signs reviewed.  Patient is well-developed and well-nourished, in no acute distress and cooperative with exam.  Head: Normocephalic and atraumatic. Eyes: PERRLA, conjunctivae normal, no scleral icterus.  Mouth: Normal posterior oropharynx Nose: Erythematous, edematous nasal turbinates  Neck: Supple, trachea midline  Cardiovascular: RRR, S1 normal, S2 normal, no murmurs, gallops, or rubs. Pulmonary/Chest: Clear to auscultation bilaterally, no wheezes, rales, or rhonchi. Abdominal: Soft, non-tender, non-distended, BS + Extremities: No lower extremity edema bilaterally Skin: Warm, dry and intact. No rashes or erythema. Psychiatric: Normal mood and affect. speech and behavior is normal. Cognition and memory are normal.   Assessment & Plan:  See encounters tab for problem based medical decision making.  Patient discussed with Dr. Eppie Gibson

## 2017-02-06 NOTE — Assessment & Plan Note (Signed)
Blood pressure initially was 158/82. On repeat her blood pressure was 135/82. She shows me her blood pressure log from home which shows good control in the 110 to 120s over 80s. She reports compliance with her lisinopril 40 mg once a day and hydrochlorothiazide 12.5 mg once a day. There have been questionable whether or not her hydrochlorothiazide 12.5 mg per day should be increased to 25 mg once a day. However, patient does have a history of orthostatic hypotension which has been attributed to autonomic dysfunction in the past. Given her relatively good control today and recent illness, I will not make changes to this medication. Patient is to follow-up with her PCP in one month.   Plan: -Continue lisinopril 40 mg once a day and hydrochlorothiazide 12.5 mg once a day -Follow-up with primary care provider in one month

## 2017-02-06 NOTE — Patient Instructions (Signed)
Mrs. Chesmore,  For your viral illness, continue taking allergy medication, your nasal spray, Tylenol or aches and pains.  For your diabetes, continue taking metformin 500 mg twice a day. If your glucose continues to remain elevated even after you are feeling better, please call our clinic and we can discuss increasing her metformin.  For your high blood pressure, continue taking lisinopril 40 mg once a day and hydrochlorothiazide 12.5 mg once a day. If your blood pressure starts becoming more elevated, please call our clinic ceasing comeback for follow-up visit. For now, continue taking the same medications.  You can move up your appointment with Dr. Inda Castle to March 26.

## 2017-02-08 ENCOUNTER — Other Ambulatory Visit: Payer: Self-pay | Admitting: Pharmacist

## 2017-02-08 DIAGNOSIS — I10 Essential (primary) hypertension: Secondary | ICD-10-CM

## 2017-02-09 MED ORDER — HYDROCHLOROTHIAZIDE 25 MG PO TABS: 12.5000 mg | ORAL_TABLET | Freq: Every day | ORAL | 1 refills | Status: DC

## 2017-03-08 NOTE — Assessment & Plan Note (Addendum)
BP Readings from Last 3 Encounters:  03/13/17 (!) 163/91  02/06/17 135/82  01/28/17 146/77   Lab Results  Component Value Date   CREATININE 1.09 (H) 11/14/2016   Lab Results  Component Value Date   K 4.9 11/14/2016   Current medications: lisinopril 40 mg daily, HCTZ 12.5 mg daily  Assessment BP goal: 140/90 BP control: Above goal.  Complicated by orthostatic hypotension.  Plan Medications: increase HCTZ to 25 mg daily Other: -Check BMP next visit

## 2017-03-08 NOTE — Assessment & Plan Note (Addendum)
Lab Results  Component Value Date   HGBA1C 8.9 03/13/2017   HGBA1C 7.0 11/14/2016   HGBA1C 7.3 08/15/2016    Recent Labs  03/13/17 1339  GLUCAP 123*    Previously on Lantus 6U QHS, which was discontinued while working up recent syncopal episodes  Current medications: metformin 500 mg BID Current insulin: none  Notes substantially worsened diet with dietary indiscretions.  Eating out more recently with friends and family, eating desserts like cake more.  Assessment HgbA1c goal: <7.0 Glycemic control: at goal Complications: polyneuropathy, proliferative retinopathy, nephropathy  Plan Medications: increase metformin to 1000 mg bid; add liraglutide Insulin: none Other: -Saw optho 3/1 for PDR and DME, stable -Avoid simple carbs in diet -If unable to afford liraglutide through [facility], will resume Lantus -F/u 3 months

## 2017-03-08 NOTE — Assessment & Plan Note (Addendum)
No recent syncopal or presyncopal episodes.  Orthostatics today Lying 186/89 85 Sitting 163/91 109 Standing 128/72 87  Orthostatic hypotension with supine hypertension likely combination of essential hypertension and autonomic dysfunction due to diabetes.  -Recommend compression stockings -Continue ample fluid intake -Check CBG whenever symptomatic -Increase HCTZ for HTN; monitor for presyncopal symptoms

## 2017-03-08 NOTE — Progress Notes (Signed)
   CC: "I've not been eating as well recently."  HPI:  [name] is a 66 y.o. woman with history of DM2, HTN, and depression who presents for management of her diabetes.  Please see A&P for status of the patient's chronic medical conditions.  Leg tightness and discomfort bilaterally for last month.    Past Medical History:  Diagnosis Date  . Allergy    SEASONAL  . Anemia   . Arthritis   . Cataract    PRESENT IN LEFT/ CATARACTS REMOVED FROM RIGHT  . Diabetes mellitus 1996  . DKA (diabetic ketoacidoses) ([place]) 01/02/2013  . Hyperlipidemia   . Hypertension   . Hypotension   . Influenza A (H1N1) 01/03/2013  . Neuromuscular disorder ([place])    NEUROPATHY  . SYNCOPE 05/10/2010   Qualifier: Diagnosis of  By: Marilynne Halsted RN, BSN, Jacquelyn      Review of Systems: Review of Systems  Constitutional: Negative for chills and fever.  Respiratory: Negative for cough and shortness of breath.   Cardiovascular: Negative for chest pain and palpitations.  Gastrointestinal: Positive for constipation. Negative for abdominal pain.  Musculoskeletal: Positive for back pain and joint pain.    Physical Exam:  Vitals:   03/13/17 1321  BP: 134/72  Pulse: 82  Temp: 98.2 F (36.8 C)  TempSrc: Oral  SpO2: 100%  Weight: 147 lb 11.2 oz (67 kg)  Height: 5\' 1"  (1.549 m)    Physical Exam  Constitutional: She is oriented to person, place, and time. She appears well-developed and well-nourished. No distress.  Cardiovascular: Normal rate, regular rhythm and normal heart sounds.   Pulmonary/Chest: Effort normal and breath sounds normal.  Neurological: She is alert and oriented to person, place, and time.  Psychiatric: She has a normal mood and affect. Her behavior is normal.    Assessment & Plan:   See Encounters Tab for problem based charting.  Patient discussed with Dr. Lynnae January

## 2017-03-13 ENCOUNTER — Ambulatory Visit (INDEPENDENT_AMBULATORY_CARE_PROVIDER_SITE_OTHER): Payer: Self-pay | Admitting: Internal Medicine

## 2017-03-13 ENCOUNTER — Encounter (INDEPENDENT_AMBULATORY_CARE_PROVIDER_SITE_OTHER): Payer: Self-pay

## 2017-03-13 ENCOUNTER — Encounter: Payer: Self-pay | Admitting: Internal Medicine

## 2017-03-13 VITALS — BP 163/91 | HR 109 | Temp 98.2°F | Ht 61.0 in | Wt 147.7 lb

## 2017-03-13 DIAGNOSIS — E113599 Type 2 diabetes mellitus with proliferative diabetic retinopathy without macular edema, unspecified eye: Secondary | ICD-10-CM

## 2017-03-13 DIAGNOSIS — E1142 Type 2 diabetes mellitus with diabetic polyneuropathy: Secondary | ICD-10-CM

## 2017-03-13 DIAGNOSIS — Z794 Long term (current) use of insulin: Secondary | ICD-10-CM

## 2017-03-13 DIAGNOSIS — E1121 Type 2 diabetes mellitus with diabetic nephropathy: Secondary | ICD-10-CM

## 2017-03-13 DIAGNOSIS — Z7984 Long term (current) use of oral hypoglycemic drugs: Secondary | ICD-10-CM

## 2017-03-13 DIAGNOSIS — J069 Acute upper respiratory infection, unspecified: Secondary | ICD-10-CM

## 2017-03-13 DIAGNOSIS — I951 Orthostatic hypotension: Secondary | ICD-10-CM

## 2017-03-13 DIAGNOSIS — Z8709 Personal history of other diseases of the respiratory system: Secondary | ICD-10-CM

## 2017-03-13 DIAGNOSIS — B9789 Other viral agents as the cause of diseases classified elsewhere: Secondary | ICD-10-CM

## 2017-03-13 DIAGNOSIS — I10 Essential (primary) hypertension: Secondary | ICD-10-CM

## 2017-03-13 DIAGNOSIS — Z79899 Other long term (current) drug therapy: Secondary | ICD-10-CM

## 2017-03-13 DIAGNOSIS — K5900 Constipation, unspecified: Secondary | ICD-10-CM

## 2017-03-13 LAB — GLUCOSE, CAPILLARY: Glucose-Capillary: 123 mg/dL — ABNORMAL HIGH (ref 65–99)

## 2017-03-13 LAB — POCT GLYCOSYLATED HEMOGLOBIN (HGB A1C): Hemoglobin A1C: 8.9

## 2017-03-13 MED ORDER — LIRAGLUTIDE 18 MG/3ML ~~LOC~~ SOPN: 1.2000 mg | PEN_INJECTOR | Freq: Every day | SUBCUTANEOUS | 11 refills | Status: DC

## 2017-03-13 MED ORDER — METFORMIN HCL 1000 MG PO TABS: 1000.0000 mg | ORAL_TABLET | Freq: Two times a day (BID) | ORAL | 11 refills | Status: DC

## 2017-03-13 MED ORDER — HYDROCHLOROTHIAZIDE 25 MG PO TABS: 25.0000 mg | ORAL_TABLET | Freq: Every day | ORAL | 1 refills | Status: DC

## 2017-03-13 MED ORDER — DOCUSATE SODIUM 100 MG PO CAPS: 100.0000 mg | ORAL_CAPSULE | Freq: Every day | ORAL | 1 refills | Status: DC | PRN

## 2017-03-13 MED ORDER — ATORVASTATIN CALCIUM 10 MG PO TABS: 10.0000 mg | ORAL_TABLET | Freq: Every day | ORAL | 11 refills | Status: DC

## 2017-03-13 NOTE — Patient Instructions (Signed)
For your diabetes, start taking 1000 mg metformin twice per day. I have also sent a prescription for Victoza to the [facility].  Please go there to pick it up and make sure you can afford it.  Try and get back to watching what you eat!  For your blood pressure, increase the HCTZ to 25 mg daily.  If you start feeling dizzy when you stand up, go back to taking 12.5 mg per day instead.  To try and get your bowel movements regular, try taking Metamucil and Colace (docusate) every day.

## 2017-03-13 NOTE — Assessment & Plan Note (Signed)
Alternating constipation for several days and then large loose stools.   -Stay hydrated -Metamucil daily -Docusate PRN

## 2017-03-13 NOTE — Assessment & Plan Note (Signed)
Cough, fevers, shortness of breath now resolved.

## 2017-03-14 NOTE — Progress Notes (Signed)
Internal Medicine Clinic Attending  Case discussed with Dr. O'Sullivan at the time of the visit.  We reviewed the resident's history and exam and pertinent patient test results.  I agree with the assessment, diagnosis, and plan of care documented in the resident's note. 

## 2017-04-03 ENCOUNTER — Telehealth: Payer: Self-pay | Admitting: Pharmacist

## 2017-04-03 DIAGNOSIS — E1142 Type 2 diabetes mellitus with diabetic polyneuropathy: Secondary | ICD-10-CM

## 2017-04-03 DIAGNOSIS — Z794 Long term (current) use of insulin: Secondary | ICD-10-CM

## 2017-04-03 DIAGNOSIS — I10 Essential (primary) hypertension: Secondary | ICD-10-CM

## 2017-04-03 MED ORDER — HYDROCHLOROTHIAZIDE 25 MG PO TABS: 25.0000 mg | ORAL_TABLET | Freq: Every day | ORAL | 1 refills | Status: DC

## 2017-04-03 MED ORDER — METFORMIN HCL ER 500 MG PO TB24: 1000.0000 mg | ORAL_TABLET | Freq: Two times a day (BID) | ORAL | 3 refills | Status: DC

## 2017-04-03 MED ORDER — ATORVASTATIN CALCIUM 10 MG PO TABS: 10.0000 mg | ORAL_TABLET | Freq: Every day | ORAL | 3 refills | Status: DC

## 2017-04-03 NOTE — Progress Notes (Signed)
Patient states she has not yet started the liraglutide due to not having a chance to follow up with [facility]. Provided patient with information on [facility] and advised her to become established in order to obtain liraglutide. Patient also requested transfer of atorvastatin, metformin, and HCTZ prescriptions to [place]. Prescriptions sent. Notified patient to also schedule a PCP follow up appointment. Patient verbalized understanding.

## 2017-04-17 ENCOUNTER — Encounter: Payer: No Typology Code available for payment source | Admitting: Internal Medicine

## 2017-04-24 ENCOUNTER — Telehealth: Payer: Self-pay | Admitting: Internal Medicine

## 2017-04-24 NOTE — Telephone Encounter (Signed)
CALLED PATIENT LMTCB, TIME TO RENEW GCCN CARD °

## 2017-04-26 ENCOUNTER — Encounter: Payer: Self-pay | Admitting: Pharmacist

## 2017-04-26 NOTE — Progress Notes (Signed)
Medication Samples have been provided to the patient.  Drug: liraglutide (Victoza) Strength: 18 mg/32mL Qty: 1 LOT: [id number] Exp.Date: Jan 2019 Dosing instructions: 0.6 mg daily x 1 week, then 1.2 mg daily  The patient has been instructed regarding the correct time, dose, and frequency of taking this medication, including desired effects and most common side effects.   Flossie Dibble 3:48 PM 04/26/2017

## 2017-05-02 ENCOUNTER — Telehealth: Payer: Self-pay | Admitting: *Deleted

## 2017-05-02 NOTE — Telephone Encounter (Signed)
Spoke w/ pt again and she checked her BP 108/66 hr 95, she has appt tomorrow and reassured her that she should do just fine, also ask her to stay well hydrated, alerted her again to any abnormal feelings- rash, short of breath, severe h/a, dizziness, weakness to call clinic or 911, she was agreeable. Passed info to dr Daryll Drown verbally.

## 2017-05-02 NOTE — Telephone Encounter (Signed)
I reviewed her medications.  She takes lisinopril and hctz herself.  She can plan to not take her BP medications today, check her BP again this afternoon and resume her normal medications tomorrow.  She should monitor for any acute reaction to amlodipine as she does not take this medication regularly (allergic type reaction, rash, SOB, etc).  Call the clinic for any concerns.

## 2017-05-02 NOTE — Telephone Encounter (Signed)
Pt calls and states she accidentally took 2 of her husband's meds this am, she has not taken any of her meds this am, she took: AMLODIPINE 10MG  1 TABLET ATORVASTATIN 40MG  1 TABLET She took these at appr 1115 Her bp at present is 136/75 hr 92, she feels fine, a little anxious Please advise, sending to attending and pcp and dr Maudie Mercury

## 2017-05-03 ENCOUNTER — Encounter (INDEPENDENT_AMBULATORY_CARE_PROVIDER_SITE_OTHER): Payer: Self-pay

## 2017-05-03 ENCOUNTER — Ambulatory Visit: Payer: Self-pay

## 2017-05-03 ENCOUNTER — Ambulatory Visit (INDEPENDENT_AMBULATORY_CARE_PROVIDER_SITE_OTHER): Payer: Self-pay | Admitting: Internal Medicine

## 2017-05-03 VITALS — BP 116/60 | HR 89 | Temp 98.2°F | Ht 61.0 in | Wt 142.9 lb

## 2017-05-03 DIAGNOSIS — M4722 Other spondylosis with radiculopathy, cervical region: Secondary | ICD-10-CM

## 2017-05-03 DIAGNOSIS — M5416 Radiculopathy, lumbar region: Secondary | ICD-10-CM

## 2017-05-03 DIAGNOSIS — E1129 Type 2 diabetes mellitus with other diabetic kidney complication: Secondary | ICD-10-CM

## 2017-05-03 DIAGNOSIS — G8929 Other chronic pain: Secondary | ICD-10-CM

## 2017-05-03 DIAGNOSIS — R809 Proteinuria, unspecified: Secondary | ICD-10-CM

## 2017-05-03 DIAGNOSIS — Z79899 Other long term (current) drug therapy: Secondary | ICD-10-CM

## 2017-05-03 DIAGNOSIS — I10 Essential (primary) hypertension: Secondary | ICD-10-CM

## 2017-05-03 HISTORY — DX: Other spondylosis with radiculopathy, cervical region: M47.22

## 2017-05-03 MED ORDER — GABAPENTIN 300 MG PO CAPS: 300.0000 mg | ORAL_CAPSULE | Freq: Every day | ORAL | 1 refills | Status: DC

## 2017-05-03 MED ORDER — IBUPROFEN 600 MG PO TABS: 600.0000 mg | ORAL_TABLET | Freq: Four times a day (QID) | ORAL | 0 refills | Status: DC | PRN

## 2017-05-03 MED ORDER — CYCLOBENZAPRINE HCL 5 MG PO TABS: 5.0000 mg | ORAL_TABLET | Freq: Two times a day (BID) | ORAL | 0 refills | Status: DC | PRN

## 2017-05-03 MED ORDER — HYDROCHLOROTHIAZIDE 12.5 MG PO TABS: 12.5000 mg | ORAL_TABLET | Freq: Every day | ORAL | 1 refills | Status: DC

## 2017-05-03 MED FILL — GABAPENTIN 300 MG CAPSULE: 300 | 30 days supply | Qty: 30 | Fill #0

## 2017-05-03 MED FILL — IBUPROFEN 600 MG TABLET: 600 | 8 days supply | Qty: 30 | Fill #0

## 2017-05-03 MED FILL — HYDROCHLOROTHIAZIDE 12.5 MG: 12.5 | 30 days supply | Qty: 30 | Fill #0

## 2017-05-03 MED FILL — CYCLOBENZAPRINE 5 MG TABLET: 5 | 15 days supply | Qty: 30 | Fill #0

## 2017-05-03 NOTE — Patient Instructions (Signed)
Reduce your hydrochlorothiazide to 12.5 mg daily.  Take flexeril muscle relaxer as needed  Take ibuprofen every 6 hours for next 1 week for pain  Take gabapentin at night time.  Follow up with your primary care in 1 month.

## 2017-05-03 NOTE — Assessment & Plan Note (Signed)
Neck pain and arm pain on left is likely 2/2 to cervical radiculopathy. Has hx of Cspine DDD based on MRI 2014. This is likely an acute pain flare of her chronic cervical DDD. No significant neuro changes, strength intact, normal sensation. Will not do any new imaging since we have previous MRI showing DDD.   Will do supportive care with ibuprofen 6000mg  q6hr prn + flexeril Also will start gabapentin for chronic lower back radiculopathy and cervical radiculopathy.

## 2017-05-03 NOTE — Assessment & Plan Note (Signed)
BP  At home has been low sometimes 88 or low 90's, other times 120-130's. Today BP is 116/60. Given it runs low, will cut down hctz from 25 mg to 12.5mg  daily. Continue lasix 40mg  daily given her diabetes with proteinuria.   F/up in 1 month.

## 2017-05-03 NOTE — Progress Notes (Signed)
   CC: left neck pain and arm pain   HPI:  [name] is a 66 y.o. with pmh as listed below is here for left sided neck and arm pain   Past Medical History:  Diagnosis Date  . Allergy    SEASONAL  . Anemia   . Arthritis   . Cataract    PRESENT IN LEFT/ CATARACTS REMOVED FROM RIGHT  . Diabetes mellitus 1996  . DKA (diabetic ketoacidoses) ([place]) 01/02/2013  . Hyperlipidemia   . Hypertension   . Hypotension   . Influenza A (H1N1) 01/03/2013  . Neuromuscular disorder ([place])    NEUROPATHY  . SYNCOPE 05/10/2010   Qualifier: Diagnosis of  By: Marilynne Halsted, RN, BSN, Jacquelyn      Has left sided neck and arm pain for 4 weeks, no injuries or heavy lifting, pain radiates from her neck to her left arm, no weakness, numbness, or tingling. Has not tried anything for the pain. Also has chronic lower back pain with tingling of both legs. No falls, no loss of bowel or bladder control, no fevers.   Last MRI Cspine 12/2012: "Cervical spondylosis and degenerative disc disease, contributing to moderate impingement at C6-7 and borderline impingement at C7-T1."  Last MRI L Spine 12/2012 "Mild disc desiccation and shallow central disc protrusion at L5-S1."  Review of Systems:   Review of Systems  Constitutional: Negative for chills and fever.  Cardiovascular: Negative for chest pain and palpitations.  Gastrointestinal: Negative for abdominal pain, heartburn, nausea and vomiting.  Genitourinary: Negative for dysuria and urgency.  Musculoskeletal: Positive for back pain, myalgias and neck pain.  Neurological: Positive for tingling. Negative for dizziness, sensory change and headaches.     Physical Exam:  Vitals:   05/03/17 1359  BP: 116/60  Pulse: 89  Temp: 98.2 F (36.8 C)  TempSrc: Oral  SpO2: 100%  Weight: 142 lb 14.4 oz (64.8 kg)  Height: 5\' 1"  (1.549 m)   Physical Exam  Constitutional: She is oriented to person, place, and time. She appears well-developed and well-nourished.  HENT:   Head: Normocephalic and atraumatic.  Eyes: Conjunctivae are normal. Right eye exhibits no discharge. Left eye exhibits no discharge.  Cardiovascular: Normal rate and regular rhythm.  Exam reveals no gallop and no friction rub.   No murmur heard. Respiratory: Effort normal and breath sounds normal. No respiratory distress. She has no wheezes.  Musculoskeletal:  Left shoulder ROM is limited to active motion due to pain, also has pain with passive motion. No swelling or erythema over the shoulder joint, no tenderness over the shoulder joint. Has some tenderness over the left Trap muscle. + Spurling's test  5/5 str on upper and lower exts. Sensation intact.   Neurological: She is alert and oriented to person, place, and time.    Assessment & Plan:   See Encounters Tab for problem based charting.  Patient discussed with Dr. Angelia Mould

## 2017-05-05 NOTE — Progress Notes (Signed)
Internal Medicine Clinic Attending  Case discussed with Dr. Genene Churn at the time of the visit.  We reviewed the resident's history and exam and pertinent patient test results.  I agree with the assessment, diagnosis, and plan of care documented in the resident's note with the following correction, Ibuprofen is 600mg  q6h PRN

## 2017-05-12 ENCOUNTER — Ambulatory Visit: Payer: Self-pay

## 2017-05-17 ENCOUNTER — Ambulatory Visit: Payer: Self-pay

## 2017-05-26 ENCOUNTER — Encounter: Payer: Self-pay | Admitting: *Deleted

## 2017-06-05 ENCOUNTER — Ambulatory Visit (INDEPENDENT_AMBULATORY_CARE_PROVIDER_SITE_OTHER): Payer: Self-pay | Admitting: Internal Medicine

## 2017-06-05 ENCOUNTER — Encounter: Payer: Self-pay | Admitting: Internal Medicine

## 2017-06-05 VITALS — BP 154/85 | HR 88 | Temp 98.2°F | Wt 141.8 lb

## 2017-06-05 DIAGNOSIS — E1122 Type 2 diabetes mellitus with diabetic chronic kidney disease: Secondary | ICD-10-CM

## 2017-06-05 DIAGNOSIS — E1121 Type 2 diabetes mellitus with diabetic nephropathy: Secondary | ICD-10-CM

## 2017-06-05 DIAGNOSIS — Z7984 Long term (current) use of oral hypoglycemic drugs: Secondary | ICD-10-CM

## 2017-06-05 DIAGNOSIS — I951 Orthostatic hypotension: Secondary | ICD-10-CM

## 2017-06-05 DIAGNOSIS — I10 Essential (primary) hypertension: Secondary | ICD-10-CM

## 2017-06-05 DIAGNOSIS — I129 Hypertensive chronic kidney disease with stage 1 through stage 4 chronic kidney disease, or unspecified chronic kidney disease: Secondary | ICD-10-CM

## 2017-06-05 DIAGNOSIS — E113599 Type 2 diabetes mellitus with proliferative diabetic retinopathy without macular edema, unspecified eye: Secondary | ICD-10-CM

## 2017-06-05 DIAGNOSIS — N183 Chronic kidney disease, stage 3 (moderate): Secondary | ICD-10-CM

## 2017-06-05 DIAGNOSIS — E1142 Type 2 diabetes mellitus with diabetic polyneuropathy: Secondary | ICD-10-CM

## 2017-06-05 DIAGNOSIS — M4722 Other spondylosis with radiculopathy, cervical region: Secondary | ICD-10-CM

## 2017-06-05 DIAGNOSIS — Z794 Long term (current) use of insulin: Secondary | ICD-10-CM

## 2017-06-05 LAB — POCT GLYCOSYLATED HEMOGLOBIN (HGB A1C): Hemoglobin A1C: 7.5

## 2017-06-05 LAB — HM DIABETES EYE EXAM

## 2017-06-05 LAB — GLUCOSE, CAPILLARY: Glucose-Capillary: 101 mg/dL — ABNORMAL HIGH (ref 65–99)

## 2017-06-05 NOTE — Assessment & Plan Note (Addendum)
Seen in clinic one month ago for left-sided neck and arm pain.  Felt to be due to her known degenerative cervical spine, given trial of conservative therapy with NSAIDs, muscle relaxant, gabapentin.  The pain is somewhat improved, but she will still have occasional "twinges" of pain which are brought on by movement or lifting. When she is sitting still she does not have pain. No radiation of pain down into her hands, no weakness in her hands, not dropping objects.  Taking Advil 2-3 times per week as needed for pain.  A/P Neck and shoulder pain due to degenerative cervical spine and trapezius spasm. -Continue gabapentin daily at bedtime -Tylenol when necessary -use NSAIDs sparingly when necessary

## 2017-06-05 NOTE — Progress Notes (Signed)
   CC: "I've been getting dizzy a bit more often again."  HPI:  Amanda [name] is a 66 y.o. woman with history of DM2, HTN, and depression who presents for management of her diabetes.  Please see A&P for status of the patient's chronic medical conditions.  Past Medical History:  Diagnosis Date  . Allergy    SEASONAL  . Anemia   . Arthritis   . Cataract    PRESENT IN LEFT/ CATARACTS REMOVED FROM RIGHT  . Diabetes mellitus 1996  . DKA (diabetic ketoacidoses) ([place]) 01/02/2013  . Hyperlipidemia   . Hypertension   . Hypotension   . Influenza A (H1N1) 01/03/2013  . Neuromuscular disorder ([place])    NEUROPATHY  . SYNCOPE 05/10/2010   Qualifier: Diagnosis of  By: Marilynne Halsted RN, BSN, Jacquelyn      Review of Systems: Review of Systems  Constitutional: Negative for chills and fever.  Respiratory: Negative for cough and shortness of breath.   Cardiovascular: Negative for chest pain and palpitations.  Musculoskeletal: Positive for joint pain and neck pain. Negative for falls.  Neurological: Positive for dizziness. Negative for loss of consciousness.    Physical Exam:  Vitals:   06/05/17 1401  BP: (!) 154/85  Pulse: 88  Temp: 98.2 F (36.8 C)  TempSrc: Oral  SpO2: 100%  Weight: 141 lb 12.8 oz (64.3 kg)    Physical Exam  Constitutional: She is oriented to person, place, and time. She appears well-developed and well-nourished. No distress.  Cardiovascular: Normal rate and regular rhythm.   DP pulses 3+ bilaterally  Pulmonary/Chest: Effort normal and breath sounds normal.  Musculoskeletal:  Neck and shoulders with good active range of motion movement Mild tenderness to palpation over her left lateral neck and superior left shoulder, most over trapezius  Neurological: She is alert and oriented to person, place, and time.  Psychiatric: She has a normal mood and affect. Her behavior is normal.    Assessment & Plan:   See Encounters Tab for problem based charting.  Patient  discussed with Dr. Daryll Drown

## 2017-06-05 NOTE — Patient Instructions (Addendum)
Because you've been coming close to passing out, I would like you to stop taking HCTZ altogether once you get your referral of lisinopril. If you feel like you're coming close to passing out, or do actually pass out, please call the clinic for an appointment right away.  Your diabetes is doing much better. Keep taking the Victoza and the metformin.  You doing a great job eating better and walking more. Keep it up!

## 2017-06-05 NOTE — Assessment & Plan Note (Addendum)
Lab Results  Component Value Date   HGBA1C 7.5 06/05/2017   HGBA1C 8.9 03/13/2017   HGBA1C 7.0 11/14/2016    Recent Labs  06/05/17 1401  GLUCAP 101*    Current medications:  metformin 1000 mg bid, liraglutide 1.2 mg daily Current insulin: none  At her last visit she noted that her diet and had markedly worsened in the interval, corresponding with increased A1c.  Now she thinks she has been better about her eating habits again, eating more vegetables and fruits.  She only increased her dose of Victoza to 1.2 mg from 0.6 mg in the last week, and now has run out of Victoza after finishing the sample was provided to her from the clinic. She also ran out of metformin to 3 days ago and is waiting for a refill in the mail from [facility].  Assessment HgbA1c goal: <7.0 Glycemic control:  Complications: polyneuropathy, proliferative retinopathy, nephropathy  Plan Medications: Continue current medications Insulin: none Other: -A1c today -Recommend scheduling eye exam

## 2017-06-05 NOTE — Assessment & Plan Note (Addendum)
  Orthostatic VS for the past 24 hrs:  BP- Lying Pulse- Lying BP- Sitting Pulse- Sitting BP- Standing at 0 minutes Pulse- Standing at 0 minutes  06/05/17 1419 163/83 85 138/78 82 103/66 91   Has had a few severe episodes in last couple of months with dizziness, vision fading, sits down and goes.  Please see further discussion essential hypertension problem.  Orthostatic hypotension with supine hypertension likely combination of essential hypertension and autonomic dysfunction due to her diabetes. -Compression stockings -Stay well-hydrated

## 2017-06-05 NOTE — Assessment & Plan Note (Addendum)
BP Readings from Last 3 Encounters:  06/05/17 (!) 154/85  05/03/17 116/60  03/13/17 (!) 163/91   Orthostatic VS for the past 24 hrs:  BP- Lying Pulse- Lying BP- Sitting Pulse- Sitting BP- Standing at 0 minutes Pulse- Standing at 0 minutes  06/05/17 1419 163/83 85 138/78 82 103/66 91    Lab Results  Component Value Date   CREATININE 1.09 (H) 11/14/2016   Lab Results  Component Value Date   K 4.9 11/14/2016   Current medications: lisinopril 40 mg daily, HCTZ 12.5 mg daily  I increased HCTZ to 25 mg at last visit 3 months ago, at acute visit 1 month ago decreased back to 12.5 mg With borderline hypotensive blood pressures recorded. She reports that in the last several months she has had presyncopal episodes with increasing frequency again. She has not passed out or fallen, but sometimes she feels like she has to sit down immediately or she will be in danger of passing out, with symptoms of dizziness, fading vision, sometimes nausea.  She has had 1-2 these severe presyncopal episodes per month couple of months.  She tries to stay well hydrated and has been wearing knee-high compression stockings for the last several months. She checks her blood pressures at home with an arm cuff, and sometimes sees systolic blood pressures in the 80s.  She has not had lisinopril in several days due to awaiting a refill in the mail from [facility], but she did take her HCTZ today.  Assessment BP goal: Complicated by orthostatic hypotension. Goal is to control hypertension without putting her at risk of syncope  She is at his usual severely orthostatic today, with borderline hypotensive standing pressures and supine hypertension. This is with her having taken her HCTZ but not lisinopril today.  With her worsening presyncopal symptoms I'm concerned that she is currently over treated again.  He acute danger of syncope and fall in this case outweighs chronic dangers of uncontrolled  hypertension.  Plan Medications: Discontinue HCTZ, continue lisinopril 40 mg daily Other: -check BMP -Recommend continued compression stockings, thigh-high if tolerated -Sleep with head of bed elevated about 30

## 2017-06-06 ENCOUNTER — Encounter: Payer: Self-pay | Admitting: Internal Medicine

## 2017-06-06 LAB — BMP8+ANION GAP
Anion Gap: 15 mmol/L (ref 10.0–18.0)
BUN/Creatinine Ratio: 19 (ref 12–28)
BUN: 26 mg/dL (ref 8–27)
CO2: 21 mmol/L (ref 20–29)
Calcium: 8.9 mg/dL (ref 8.7–10.3)
Chloride: 105 mmol/L (ref 96–106)
Creatinine, Ser: 1.35 mg/dL — ABNORMAL HIGH (ref 0.57–1.00)
GFR calc Af Amer: 48 mL/min/{1.73_m2} — ABNORMAL LOW (ref >59)
GFR calc non Af Amer: 42 mL/min/{1.73_m2} — ABNORMAL LOW (ref >59)
Glucose: 102 mg/dL — ABNORMAL HIGH (ref 65–99)
Potassium: 4.8 mmol/L (ref 3.5–5.2)
Sodium: 141 mmol/L (ref 134–144)

## 2017-06-06 LAB — SPECIMEN STATUS

## 2017-06-06 NOTE — Progress Notes (Signed)
Internal Medicine Clinic Attending  Case discussed with Dr. O'Sullivan at the time of the visit.  We reviewed the resident's history and exam and pertinent patient test results.  I agree with the assessment, diagnosis, and plan of care documented in the resident's note. 

## 2017-06-06 NOTE — Assessment & Plan Note (Signed)
  BMP Latest Ref Rng & Units 06/05/2017 11/14/2016 08/15/2016  Glucose 65 - 99 mg/dL 102(H) 118(H) 107(H)  BUN 8 - 27 mg/dL 26 23 26   Creatinine 0.57 - 1.00 mg/dL 1.35(H) 1.09(H) 1.05(H)  BUN/Creat Ratio 12 - 28 19 21 25   Sodium 134 - 144 mmol/L 141 142 141  Potassium 3.5 - 5.2 mmol/L 4.8 4.9 4.8  Chloride 96 - 106 mmol/L 105 102 106  CO2 20 - 29 mmol/L 21 24 20   Calcium 8.7 - 10.3 mg/dL 8.9 9.1 9.0   History of microalbuminemia with previously stable normal renal function.  Her renal function may be showing a trend towards worsening in the past year, with GFR today placing her in CKD 3 range.  -Continue lisinopril 40 mg daily -Continue working to optimize glycemic blood pressure control -Repeat BMP at next visit

## 2017-06-12 ENCOUNTER — Other Ambulatory Visit: Payer: Self-pay

## 2017-06-12 DIAGNOSIS — Z794 Long term (current) use of insulin: Secondary | ICD-10-CM

## 2017-06-12 DIAGNOSIS — E1142 Type 2 diabetes mellitus with diabetic polyneuropathy: Secondary | ICD-10-CM

## 2017-06-12 DIAGNOSIS — I10 Essential (primary) hypertension: Secondary | ICD-10-CM

## 2017-06-12 MED ORDER — ATORVASTATIN CALCIUM 10 MG PO TABS: 10.0000 mg | ORAL_TABLET | Freq: Every day | ORAL | 3 refills | Status: DC

## 2017-06-12 MED ORDER — METFORMIN HCL ER 500 MG PO TB24: 1000.0000 mg | ORAL_TABLET | Freq: Two times a day (BID) | ORAL | 3 refills | Status: DC

## 2017-06-12 MED ORDER — LISINOPRIL 40 MG PO TABS: 40.0000 mg | ORAL_TABLET | Freq: Every day | ORAL | 3 refills | Status: DC

## 2017-06-12 NOTE — Telephone Encounter (Signed)
metFORMIN (GLUCOPHAGE XR) 500 MG 24 hr tablet,  lisinopril (PRINIVIL,ZESTRIL) 40 MG tablet,  atorvastatin (LIPITOR) 10 MG tablet, Refill  Request @ [facility] on [address]

## 2017-06-13 ENCOUNTER — Other Ambulatory Visit: Payer: Self-pay | Admitting: *Deleted

## 2017-06-13 DIAGNOSIS — I10 Essential (primary) hypertension: Secondary | ICD-10-CM

## 2017-06-13 NOTE — Telephone Encounter (Signed)
Has been addressed

## 2017-06-14 ENCOUNTER — Ambulatory Visit: Payer: Self-pay | Admitting: Pharmacist

## 2017-06-14 VITALS — BP 127/74 | HR 85

## 2017-06-14 DIAGNOSIS — Z79899 Other long term (current) drug therapy: Secondary | ICD-10-CM

## 2017-06-14 NOTE — Progress Notes (Signed)
Med Review: Amanda [name] is a 66 y.o. female reports to clinical pharmacist appointment for HTN and DM management. Pt brought in medication bottles. Medication review was completed with the pt. Pt states that she has been out of atorvastatin, lisinopril and metformin for two weeks. A new prescription was sent to [facility] on 6/25 but samples were given today in clinic for patient until the new medications arrive in the mail.   HTN: HTN was controlled today with BP at 127/49mmHg, down from 154/85 at last visit. Today patient has taken Hydrochlorothiazide 12.5mg  for blood pressure. Pt reports feeling better and no dizziness since stopping lisinopril 2 weeks ago. Pt reports that she was instructed to continue taking Hydrochlorothiazide until she received metformin and lisinopril prescriptions. Pt will stop Hydrochlorothiazide today. Pt reports she has been walking more and is interested in joining "[employer]" and learning to swim at the [facility].  DM: Pt has been without medication. Today patient received metformin and Victoza samples to hold over until she receives refills from [facility] and gets enrolled in MAP Program.   ASCVD protection: Pt received atorvastatin samples today to hold over until she receives refill from [facility], [name] Candidate

## 2017-07-04 ENCOUNTER — Other Ambulatory Visit: Payer: Self-pay | Admitting: Internal Medicine

## 2017-07-04 ENCOUNTER — Other Ambulatory Visit: Payer: Self-pay | Admitting: Family

## 2017-07-04 DIAGNOSIS — Z794 Long term (current) use of insulin: Secondary | ICD-10-CM

## 2017-07-04 DIAGNOSIS — J302 Other seasonal allergic rhinitis: Secondary | ICD-10-CM

## 2017-07-04 DIAGNOSIS — F331 Major depressive disorder, recurrent, moderate: Secondary | ICD-10-CM

## 2017-07-04 DIAGNOSIS — E1142 Type 2 diabetes mellitus with diabetic polyneuropathy: Secondary | ICD-10-CM

## 2017-07-04 DIAGNOSIS — I10 Essential (primary) hypertension: Secondary | ICD-10-CM

## 2017-07-05 NOTE — Telephone Encounter (Signed)
She was only supposed to be on this for 2 weeks back in September.  At last visit in June, she reported taking ibuprofen as well.  She should not be taking both.  I can give her a 2 week supply at most, but would prefer her to come in to discuss continued pain as her previous PCP wanted her to use NSAIDs only sparingly.

## 2017-07-07 ENCOUNTER — Ambulatory Visit: Payer: Self-pay | Admitting: Pharmacist

## 2017-07-07 DIAGNOSIS — I10 Essential (primary) hypertension: Secondary | ICD-10-CM

## 2017-07-07 DIAGNOSIS — M4722 Other spondylosis with radiculopathy, cervical region: Secondary | ICD-10-CM

## 2017-07-07 MED ORDER — ATORVASTATIN CALCIUM 10 MG PO TABS: 10.0000 mg | ORAL_TABLET | Freq: Every day | ORAL | 0 refills | Status: DC

## 2017-07-07 MED ORDER — GABAPENTIN 300 MG PO CAPS: 300.0000 mg | ORAL_CAPSULE | Freq: Every day | ORAL | 1 refills | Status: DC

## 2017-07-07 MED ORDER — LISINOPRIL 40 MG PO TABS: 40.0000 mg | ORAL_TABLET | Freq: Every day | ORAL | 0 refills | Status: DC

## 2017-07-07 MED FILL — GABAPENTIN 300 MG CAPSULE: 300 | 30 days supply | Qty: 30 | Fill #0

## 2017-07-07 MED FILL — ATORVASTATIN 10 MG TABLET: 10 | 30 days supply | Qty: 30 | Fill #0

## 2017-07-07 MED FILL — LISINOPRIL 40 MG TAB: 40 | 30 days supply | Qty: 30 | Fill #0

## 2017-07-07 NOTE — Telephone Encounter (Signed)
Can you get these sent to [place] as new refill requests?   Thanks

## 2017-07-08 NOTE — Progress Notes (Signed)
S: Amanda [name] is a 66 y.o. female reports to clinical pharmacist appointment for help with medication.   Allergies  Allergen Reactions  . Gabapentin     Pt states medication made her feel "out of it".     Medication Sig  acetaminophen (TYLENOL) 325 MG tablet Take 2 tablets (650 mg total) by mouth every 6 (six) hours as needed (or Fever >/= 101).  aspirin EC 81 MG tablet Take 81 mg by mouth daily.  atorvastatin (LIPITOR) 10 MG tablet Take 1 tablet (10 mg total) by mouth daily. D/c previous atorvastatin prescription  Capsaicin-Menthol-Methyl Sal (CAPSAICIN-METHYL SAL-MENTHOL) 0.025-1-12 % CREA Apply over painful areas  Cholecalciferol (VITAMIN D3) 2000 units capsule Take by mouth.  cyclobenzaprine (FLEXERIL) 5 MG tablet Take 1 tablet (5 mg total) by mouth 2 (two) times daily as needed for muscle spasms.  diclofenac sodium (VOLTAREN) 1 % GEL Apply 4 g topically 4 (four) times daily.  dorzolamide-timolol (COSOPT) 22.3-6.8 MG/ML ophthalmic solution   gabapentin (NEURONTIN) 300 MG capsule Take 1 capsule (300 mg total) by mouth at bedtime. im program 1 time fill  latanoprost (XALATAN) 0.005 % ophthalmic solution 1 drop at bedtime.  liraglutide 18 MG/3ML SOPN Inject 0.2 mLs (1.2 mg total) into the skin daily. 0.6 mg daily x 1 week, then increase to 1.2 mg daily. PHARMACIST counsel, MAP program  lisinopril (PRINIVIL,ZESTRIL) 40 MG tablet Take 1 tablet (40 mg total) by mouth daily.  metFORMIN (GLUCOPHAGE XR) 500 MG 24 hr tablet Take 2 tablets (1,000 mg total) by mouth 2 (two) times daily with a meal. D/c previous metformin prescription  mometasone (NASONEX) 50 MCG/ACT nasal spray Place 2 sprays into the nose daily.  Multiple Vitamins-Minerals (WOMENS 50+ MULTI VITAMIN/MIN) TABS Take 1 tablet by mouth daily.  naproxen (NAPROSYN) 500 MG tablet Take 1 tablet (500 mg total) by mouth 2 (two) times daily with a meal.  senna (SENOKOT) 8.6 MG TABS tablet Take 1 tablet (8.6 mg total) by mouth daily as  needed for mild constipation.   Past Medical History:  Diagnosis Date  . Allergy    SEASONAL  . Anemia   . Arthritis   . Cataract    PRESENT IN LEFT/ CATARACTS REMOVED FROM RIGHT  . Diabetes mellitus 1996  . DKA (diabetic ketoacidoses) ([place]) 01/02/2013  . Hyperlipidemia   . Hypertension   . Hypotension   . Influenza A (H1N1) 01/03/2013  . Neuromuscular disorder ([place])    NEUROPATHY  . SYNCOPE 05/10/2010   Qualifier: Diagnosis of  By: Marilynne Halsted RN, BSN, Jacquelyn     Social History   Social History  . Marital status: Married    Spouse name: N/A  . Number of children: N/A  . Years of education: N/A   Social History Main Topics  . Smoking status: Never Smoker  . Smokeless tobacco: Never Used  . Alcohol use No  . Drug use: No  . Sexual activity: Not on file   Other Topics Concern  . Not on file   Social History Narrative  . No narrative on file   Family History  Problem Relation Age of Onset  . Diabetes Mother   . Hypertension Mother   . Breast cancer Mother   . Aneurysm Father   . Diabetes Sister   . Hypertension Sister   . Kidney disease Sister        One Kidney transplant, one sister on HD  . Breast cancer Sister    O: Component Value Date/Time   CHOL  160 03/18/2014 0908   HDL 63 03/18/2014 0908   TRIG 43 03/18/2014 0908   AST 19 01/02/2013 0733   ALT 12 01/02/2013 0733   NA 141 06/05/2017 1401   K 4.8 06/05/2017 1401   CL 105 06/05/2017 1401   CO2 21 06/05/2017 1401   GLUCOSE 102 (H) 06/05/2017 1401   GLUCOSE 53 (L) 06/16/2015 1553   HGBA1C 7.5 06/05/2017 1428   HGBA1C 15.8 (H) 01/02/2013 1705   BUN 26 06/05/2017 1401   CREATININE 1.35 (H) 06/05/2017 1401   CREATININE 0.97 06/16/2015 1553   CALCIUM 8.9 06/05/2017 1401   GFRAA 48 (L) 06/05/2017 1401   GFRAA 72 06/16/2015 1553   WBC WILL FOLLOW 06/05/2017 1401   WBC 3.4 (L) 04/05/2014 1410   HGB WILL FOLLOW 06/05/2017 1401   HCT WILL FOLLOW 06/05/2017 1401   PLT WILL FOLLOW 06/05/2017 1401   TSH  1.520 11/24/2015 1543   Ht Readings from Last 2 Encounters:  05/03/17 5\' 1"  (1.549 m)  03/13/17 5\' 1"  (1.549 m)   Wt Readings from Last 2 Encounters:  06/05/17 141 lb 12.8 oz (64.3 kg)  05/03/17 142 lb 14.4 oz (64.8 kg)   There is no height or weight on file to calculate BMI. BP Readings from Last 3 Encounters:  06/14/17 127/74  06/05/17 (!) 154/85  05/03/17 116/60   A/P:  Hx of uncontrolled DM and HTN, BP at goal, A1C improved from 8.9 to 7.5  Patient states she ran out of medications and is working on enrolling in [employer], paperwork submitted  Helped patient access a short supply of medication while awaiting enrollment, Victoza sample provided, will continue to assist as needed.  Advised patient to schedule appointment with new PCP, contact clinic if concerns arise.  Patient verbalized understanding

## 2017-07-10 NOTE — Telephone Encounter (Signed)
So they do not need to be filled, correct?  Thank you!

## 2017-07-10 NOTE — Telephone Encounter (Signed)
Needs appt per dr Daryll Drown

## 2017-07-10 NOTE — Telephone Encounter (Signed)
meds discussed with Dr. Maudie Mercury (saw pt 7/20). Patient not currently on these meds.

## 2017-07-21 ENCOUNTER — Ambulatory Visit: Payer: Self-pay

## 2017-07-21 NOTE — Telephone Encounter (Signed)
Per [facility], she has an appt in [facility] on 8/10.

## 2017-07-21 NOTE — Telephone Encounter (Signed)
Since being on this medication, her Cr has risen from 1.09 to 1.35.  I do not think it is appropriate to continue.  She was supposed to come back in if pain did not improve. She can discuss it with resident she sees on 8/10.  But refills should be held until renal function is assessed.  If she calls in asking, please give her this information.  Thanks

## 2017-07-28 ENCOUNTER — Ambulatory Visit: Payer: Self-pay

## 2017-08-04 ENCOUNTER — Ambulatory Visit (INDEPENDENT_AMBULATORY_CARE_PROVIDER_SITE_OTHER): Payer: Medicare Other | Admitting: Internal Medicine

## 2017-08-04 ENCOUNTER — Ambulatory Visit: Payer: Medicare Other | Admitting: Pharmacist

## 2017-08-04 VITALS — BP 155/69 | HR 76 | Temp 98.1°F | Wt 142.3 lb

## 2017-08-04 DIAGNOSIS — Z79899 Other long term (current) drug therapy: Secondary | ICD-10-CM | POA: Diagnosis not present

## 2017-08-04 DIAGNOSIS — Z8249 Family history of ischemic heart disease and other diseases of the circulatory system: Secondary | ICD-10-CM

## 2017-08-04 DIAGNOSIS — I10 Essential (primary) hypertension: Secondary | ICD-10-CM | POA: Diagnosis not present

## 2017-08-04 MED ORDER — DILTIAZEM HCL ER COATED BEADS 180 MG PO CP24: 180.0000 mg | ORAL_CAPSULE | Freq: Every day | ORAL | 3 refills | Status: DC

## 2017-08-04 NOTE — Assessment & Plan Note (Addendum)
The patient presents for hypertension follow-up. BP today 172/76 and 155/79 when rechecked. Currently on lisinopril 40. Reports compliance of medication. Previously on HCTZ, but discontinued secondary to orthostatic hypotension. Creatinine 1.3 and 05/2017. Patient denies HA, blurry vision, chest pain, SOB, and LE edema.   - BMET to check renal function  - Continue lisinopril 40mg  daily. Will switch to a different agent if Cr remains elevated.  - START cardizem 180mg  daily  - Patient instructed to call if she experiences symptoms of orthostatic hypotension  - F/u in 4 weeks for BP recheck   8/21: Spoke to patient about lab results (Na 146, K 5.3, Cr 1.33 stable from prior). Patient reported recent episodes of diarrhea. Has scheduled appt with GI. Emphasized importance of oral hydration given labs consistent with mild dehydration, especially on days where she is having multiple of episodes of diarrhea.

## 2017-08-04 NOTE — Progress Notes (Signed)
   CC: HTN follow-up  HPI:  Amanda [name] is a 66 y.o. female with PMH as described below who presents for HTN follow-up. Please problem based assessment and plan for further details.   Past Medical History:  Diagnosis Date  . Allergy    SEASONAL  . Anemia   . Arthritis   . Cataract    PRESENT IN LEFT/ CATARACTS REMOVED FROM RIGHT  . Diabetes mellitus 1996  . DKA (diabetic ketoacidoses) ([place]) 01/02/2013  . Hyperlipidemia   . Hypertension   . Hypotension   . Influenza A (H1N1) 01/03/2013  . Neuromuscular disorder ([place])    NEUROPATHY  . SYNCOPE 05/10/2010   Qualifier: Diagnosis of  By: Marilynne Halsted RN, BSN, Jacquelyn     Review of Systems:  Review of Systems  Constitutional: Negative for chills, fever and malaise/fatigue.  Respiratory: Negative for cough and shortness of breath.   Cardiovascular: Negative for chest pain, palpitations and leg swelling.  Gastrointestinal: Positive for diarrhea. Negative for abdominal pain, constipation, nausea and vomiting.    Physical Exam:  Vitals:   08/04/17 1032 08/04/17 1112  BP: (!) 172/76 (!) 155/69  Pulse: 79 76  Temp: 98.1 F (36.7 C)   TempSrc: Oral   SpO2: 100%   Weight: 142 lb 4.8 oz (64.5 kg)    General: pleasant female, well-nourished, well-developed, in NAD  HENT: NCAT,neck supple and FROM, MMM, OP clear without exudates or erythema  Cardiac: regular rate and rhythm, nl S1/S2, no murmurs, rubs or gallops  Pulm: CTAB, no wheezes or crackles, no increased work of breathing  Ext: warm and well perfused, no peripheral edema    Assessment & Plan:   See Encounters Tab for problem based charting.  Patient seen with Dr. Lynnae January

## 2017-08-04 NOTE — Patient Instructions (Signed)
It was nice to meet you today, [name].  Please start taking diltiazem 180 mg 1 tablet every day. These continue taking lisinopril 40 mg 1 tablet every day.  I will call you if your blood test results are abnormal.

## 2017-08-05 LAB — BMP8+ANION GAP
Anion Gap: 16 mmol/L (ref 10.0–18.0)
BUN/Creatinine Ratio: 25 (ref 12–28)
BUN: 33 mg/dL — ABNORMAL HIGH (ref 8–27)
CO2: 18 mmol/L — ABNORMAL LOW (ref 20–29)
Calcium: 9 mg/dL (ref 8.7–10.3)
Chloride: 111 mmol/L — ABNORMAL HIGH (ref 96–106)
Creatinine, Ser: 1.33 mg/dL — ABNORMAL HIGH (ref 0.57–1.00)
GFR calc Af Amer: 48 mL/min/{1.73_m2} — ABNORMAL LOW (ref >59)
GFR calc non Af Amer: 42 mL/min/{1.73_m2} — ABNORMAL LOW (ref >59)
Glucose: 114 mg/dL — ABNORMAL HIGH (ref 65–99)
Potassium: 5.4 mmol/L — ABNORMAL HIGH (ref 3.5–5.2)
Sodium: 145 mmol/L — ABNORMAL HIGH (ref 134–144)

## 2017-08-07 MED FILL — CARTIA XT 180 MG CAPSULE SA: 180 | 30 days supply | Qty: 30 | Fill #0

## 2017-08-08 NOTE — Progress Notes (Signed)
Internal Medicine Clinic Attending  I saw and evaluated the patient.  I personally confirmed the key portions of the history and exam documented by Dr. Santos-Sanchez and I reviewed pertinent patient test results.  The assessment, diagnosis, and plan were formulated together and I agree with the documentation in the resident's note. 

## 2017-08-17 MED ORDER — DILTIAZEM HCL ER COATED BEADS 180 MG PO CP24: 180.0000 mg | ORAL_CAPSULE | Freq: Every day | ORAL | 3 refills | Status: DC

## 2017-08-17 NOTE — Addendum Note (Signed)
Addended by: Forde Dandy on: 08/17/2017 04:46 PM   Modules accepted: Orders

## 2017-08-24 ENCOUNTER — Ambulatory Visit (INDEPENDENT_AMBULATORY_CARE_PROVIDER_SITE_OTHER): Payer: Medicare Other | Admitting: Podiatry

## 2017-08-24 DIAGNOSIS — M79674 Pain in right toe(s): Secondary | ICD-10-CM | POA: Diagnosis not present

## 2017-08-24 DIAGNOSIS — M79675 Pain in left toe(s): Secondary | ICD-10-CM | POA: Diagnosis not present

## 2017-08-24 DIAGNOSIS — B351 Tinea unguium: Secondary | ICD-10-CM | POA: Diagnosis not present

## 2017-08-24 DIAGNOSIS — E1149 Type 2 diabetes mellitus with other diabetic neurological complication: Secondary | ICD-10-CM | POA: Diagnosis not present

## 2017-08-24 DIAGNOSIS — L600 Ingrowing nail: Secondary | ICD-10-CM

## 2017-08-24 NOTE — Patient Instructions (Signed)
Have a great weekend! If you have any questions, please give [place] a call at [phone]   Diabetes and Foot Care Diabetes may cause you to have problems because of poor blood supply (circulation) to your feet and legs. This may cause the skin on your feet to become thinner, break easier, and heal more slowly. Your skin may become dry, and the skin may peel and crack. You may also have nerve damage in your legs and feet causing decreased feeling in them. You may not notice minor injuries to your feet that could lead to infections or more serious problems. Taking care of your feet is one of the most important things you can do for yourself. Follow these instructions at home:  Wear shoes at all times, even in the house. Do not go barefoot. Bare feet are easily injured.  Check your feet daily for blisters, cuts, and redness. If you cannot see the bottom of your feet, use a mirror or ask someone for help.  Wash your feet with warm water (do not use hot water) and mild soap. Then pat your feet and the areas between your toes until they are completely dry. Do not soak your feet as this can dry your skin.  Apply a moisturizing lotion or petroleum jelly (that does not contain alcohol and is unscented) to the skin on your feet and to dry, brittle toenails. Do not apply lotion between your toes.  Trim your toenails straight across. Do not dig under them or around the cuticle. File the edges of your nails with an emery board or nail file.  Do not cut corns or calluses or try to remove them with medicine.  Wear clean socks or stockings every day. Make sure they are not too tight. Do not wear knee-high stockings since they may decrease blood flow to your legs.  Wear shoes that fit properly and have enough cushioning. To break in new shoes, wear them for just a few hours a day. This prevents you from injuring your feet. Always look in your shoes before you put them on to be sure there are no objects  inside.  Do not cross your legs. This may decrease the blood flow to your feet.  If you find a minor scrape, cut, or break in the skin on your feet, keep it and the skin around it clean and dry. These areas may be cleansed with mild soap and water. Do not cleanse the area with peroxide, alcohol, or iodine.  When you remove an adhesive bandage, be sure not to damage the skin around it.  If you have a wound, look at it several times a day to make sure it is healing.  Do not use heating pads or hot water bottles. They may burn your skin. If you have lost feeling in your feet or legs, you may not know it is happening until it is too late.  Make sure your health care provider performs a complete foot exam at least annually or more often if you have foot problems. Report any cuts, sores, or bruises to your health care provider immediately. Contact a health care provider if:  You have an injury that is not healing.  You have cuts or breaks in the skin.  You have an ingrown nail.  You notice redness on your legs or feet.  You feel burning or tingling in your legs or feet.  You have pain or cramps in your legs and feet.  Your legs or  feet are numb.  Your feet always feel cold. Get help right away if:  There is increasing redness, swelling, or pain in or around a wound.  There is a red line that goes up your leg.  Pus is coming from a wound.  You develop a fever or as directed by your health care provider.  You notice a bad smell coming from an ulcer or wound. This information is not intended to replace advice given to you by your health care provider. Make sure you discuss any questions you have with your health care provider. Document Released: 12/02/2000 Document Revised: 05/12/2016 Document Reviewed: 05/14/2013 Elsevier Interactive Patient Education  2017 [employer].

## 2017-08-28 NOTE — Progress Notes (Signed)
Subjective:    Patient ID: Amanda [name], female   DOB: 67 y.o.   MRN: [id number]   HPI 66 year old female presents the office they for concerns of thick, discolored toenails that she cannot trim herself and for possible ingrown toenails. Denies he swelling or redness to the toenail sites. She states that she is doing well and her A1c is 7.5. She states that she does have neuropathy that she's been seen for previously and is currently controlled. She denies any claudication symptoms. She has no other concerns.   Review of Systems  All other systems reviewed and are negative.       Objective:  Physical Exam General: AAO x3, NAD  Dermatological: Nails are hypertrophic, dystrophic, brittle, discolored, elongated 10. There is incurvation of multiple nails particularly big toenails. No surrounding redness or drainage. Tenderness nails 1-5 bilaterally. No open lesions or pre-ulcerative lesions are identified today.  Vascular: Dorsalis Pedis artery and Posterior Tibial artery pedal pulses are 2/4 bilateral with immedate capillary fill time.  There is no pain with calf compression, swelling, warmth, erythema.   Neruologic: Sensation decreased with Derrel Nip monofilament.   Musculoskeletal: No gross boney pedal deformities bilateral. No pain, crepitus, or limitation noted with foot and ankle range of motion bilateral. Muscular strength 5/5 in all groups tested bilateral.  Gait: Unassisted, Nonantalgic.     Assessment:     Symptomatic onychomycosis, ingrown toenails    Plan:     -Treatment options discussed including all alternatives, risks, and complications -Etiology of symptoms were discussed -Nails debrided 10 without complications or bleeding. -Discussion and options for nail fungus. She wishes to proceed with treatment. I ordered a compound cream for onychomycosis. This was ordered through [employer].  -Daily foot inspection -Follow-up in 3 months or sooner if any problems  arise. In the meantime, encouraged to call the office with any questions, concerns, change in symptoms.   Celesta Gentile, DPM

## 2017-09-01 NOTE — Addendum Note (Signed)
Addended by: Forde Dandy on: 09/01/2017 09:59 AM   Modules accepted: Orders

## 2017-09-05 ENCOUNTER — Encounter: Payer: Self-pay | Admitting: Gastroenterology

## 2017-09-05 ENCOUNTER — Ambulatory Visit (INDEPENDENT_AMBULATORY_CARE_PROVIDER_SITE_OTHER): Payer: Medicare Other | Admitting: Gastroenterology

## 2017-09-05 VITALS — BP 110/70 | HR 80 | Ht 61.0 in | Wt 140.2 lb

## 2017-09-05 DIAGNOSIS — K5900 Constipation, unspecified: Secondary | ICD-10-CM | POA: Diagnosis not present

## 2017-09-05 DIAGNOSIS — R197 Diarrhea, unspecified: Secondary | ICD-10-CM | POA: Diagnosis not present

## 2017-09-05 NOTE — Patient Instructions (Addendum)
Please start taking citrucel (orange flavored) powder fiber supplement.  This may cause some bloating at first but that usually goes away. Begin with a small spoonful and work your way up to a large, heaping spoonful daily over a week.  Call Dr. Ardis Hughs' office in 4-5 weeks to report on your response.  Normal BMI (Body Mass Index- based on height and weight) is between 23 and 30. Your BMI today is Body mass index is 26.5 kg/m. [name] Please consider follow up  regarding your BMI with your Primary Care Provider.

## 2017-09-05 NOTE — Progress Notes (Signed)
Review of pertinent gastrointestinal problems: 1. Adenomatous colon polyps, colonoscopy June 2015 Dr. Ardis Hughs for chronic intermittent diarrhea found two subcentimeter adenomas. Recommended recall at 5 year interval. 2. Diverticulosis 3. Chronic intermittent diarrhea, colonoscopy June 2015 found normal terminal ileum, random biopsies of the colon showed no microscopic colitis.   Fiber seemed to help in 2015  HPI: This is a  very pleasant 66 year old woman whom I last saw about 3 years ago  Chief complaint is alternating constipation diarrhea  Alternates constipation, diarrhea.  This has been going on for many years  Will go 4-5 days with constipation, then will have diarrhea for a day.    Never sees blood rectally.  Only very rarely takes takes fiber.  ROS: complete GI ROS as described in HPI, all other review negative.  Constitutional:  No unintentional weight loss  Weight is up 8 pounds since OV here (same scale) 2015   Past Medical History:  Diagnosis Date  . Allergy    SEASONAL  . Anemia   . Arthritis   . Cataract    PRESENT IN LEFT/ CATARACTS REMOVED FROM RIGHT  . Diabetes mellitus 1996  . DKA (diabetic ketoacidoses) ([place]) 01/02/2013  . Hyperlipidemia   . Hypertension   . Hypotension   . Influenza A (H1N1) 01/03/2013  . Neuromuscular disorder ([place])    NEUROPATHY  . SYNCOPE 05/10/2010   Qualifier: Diagnosis of  By: Marilynne Halsted RN, BSN, Jacquelyn      Past Surgical History:  Procedure Laterality Date  . ABDOMINAL HYSTERECTOMY     10 years ago   . Cataracts    . ROTATOR CUFF REPAIR    . SHOULDER SURGERY     Rotator cuff tear     Current Outpatient Prescriptions  Medication Sig Dispense Refill  . acetaminophen (TYLENOL) 325 MG tablet Take 2 tablets (650 mg total) by mouth every 6 (six) hours as needed (or Fever >/= 101). 60 tablet 0  . aspirin EC 81 MG tablet Take 81 mg by mouth daily.    [name] atorvastatin (LIPITOR) 10 MG tablet Take 1 tablet (10 mg total) by mouth  daily. D/c previous atorvastatin prescription 90 tablet 3  . Capsaicin-Menthol-Methyl Sal (CAPSAICIN-METHYL SAL-MENTHOL) 0.025-1-12 % CREA Apply over painful areas 1 Tube 3  . Cholecalciferol (VITAMIN D3) 2000 units capsule Take by mouth.    . cyclobenzaprine (FLEXERIL) 5 MG tablet Take 1 tablet (5 mg total) by mouth 2 (two) times daily as needed for muscle spasms. 30 tablet 0  . diltiazem (CARDIZEM CD) 180 MG 24 hr capsule Take 1 capsule (180 mg total) by mouth daily. IM program 30 capsule 3  . dorzolamide-timolol (COSOPT) 22.3-6.8 MG/ML ophthalmic solution     . latanoprost (XALATAN) 0.005 % ophthalmic solution 1 drop at bedtime.    . liraglutide 18 MG/3ML SOPN Inject 0.2 mLs (1.2 mg total) into the skin daily. 0.6 mg daily x 1 week, then increase to 1.2 mg daily. PHARMACIST counsel, MAP program 6 mL 11  . lisinopril (PRINIVIL,ZESTRIL) 40 MG tablet Take 1 tablet (40 mg total) by mouth daily. 90 tablet 3  . metFORMIN (GLUCOPHAGE XR) 500 MG 24 hr tablet Take 2 tablets (1,000 mg total) by mouth 2 (two) times daily with a meal. D/c previous metformin prescription 360 tablet 3  . Multiple Vitamins-Minerals (WOMENS 50+ MULTI VITAMIN/MIN) TABS Take 1 tablet by mouth daily. 30 tablet    No current facility-administered medications for this visit.     Allergies as of 09/05/2017 - Review  Complete 09/05/2017  Allergen Reaction Noted  . Gabapentin  01/17/2014    Family History  Problem Relation Age of Onset  . Diabetes Mother   . Hypertension Mother   . Breast cancer Mother   . Aneurysm Father   . Diabetes Sister   . Hypertension Sister   . Kidney disease Sister        One Kidney transplant, one sister on HD  . Breast cancer Sister     Social History   Social History  . Marital status: Married    Spouse name: N/A  . Number of children: 1  . Years of education: N/A   Occupational History  . retired    Social History Main Topics  . Smoking status: Never Smoker  . Smokeless tobacco:  Never Used  . Alcohol use No  . Drug use: No  . Sexual activity: Not on file   Other Topics Concern  . Not on file   Social History Narrative  . No narrative on file     Physical Exam: BP 110/70   Pulse 80   Ht 5\' 1"  (1.549 m)   Wt 140 lb 4 oz (63.6 kg)   BMI 26.50 kg/m  Constitutional: generally well-appearing Psychiatric: alert and oriented x3 Abdomen: soft, nontender, nondistended, no obvious ascites, no peritoneal signs, normal bowel sounds No peripheral edema noted in lower extremities  Assessment and plan: 66 y.o. female with alternating constipation and diarrhea  She had a colonoscopy in 2015 and that does not need to be repeated now. She will need recall colonoscopy 2020 for  2 subcentimeter adenomatous polyps. I recommended she restart fiber supplements on a daily basis and call in for 5 weeks to report on her response. Previously this seemed to help, she discussed away from it over time.  Please see the "Patient Instructions" section for addition details about the plan.  Owens Loffler, MD [facility] 09/05/2017, 10:59 AM

## 2017-09-20 MED FILL — CARTIA XT 180 MG CAPSULE SA: 180 | 30 days supply | Qty: 30 | Fill #1

## 2017-09-20 MED FILL — PENICILLIN VK 500 MG TABLET: 500 | 10 days supply | Qty: 40 | Fill #0

## 2017-09-20 MED FILL — HYDROCODON-APAP 10-325: 10-325 | 5 days supply | Qty: 30 | Fill #0

## 2017-10-13 ENCOUNTER — Ambulatory Visit: Payer: Medicare Other | Admitting: Pharmacist

## 2017-10-16 ENCOUNTER — Encounter (INDEPENDENT_AMBULATORY_CARE_PROVIDER_SITE_OTHER): Payer: Self-pay

## 2017-10-16 ENCOUNTER — Ambulatory Visit (INDEPENDENT_AMBULATORY_CARE_PROVIDER_SITE_OTHER): Payer: Medicare Other | Admitting: Pharmacist

## 2017-10-16 DIAGNOSIS — Z841 Family history of disorders of kidney and ureter: Secondary | ICD-10-CM | POA: Diagnosis not present

## 2017-10-16 DIAGNOSIS — E1142 Type 2 diabetes mellitus with diabetic polyneuropathy: Secondary | ICD-10-CM

## 2017-10-16 DIAGNOSIS — Z79899 Other long term (current) drug therapy: Secondary | ICD-10-CM

## 2017-10-16 DIAGNOSIS — Z803 Family history of malignant neoplasm of breast: Secondary | ICD-10-CM

## 2017-10-16 DIAGNOSIS — I10 Essential (primary) hypertension: Secondary | ICD-10-CM | POA: Diagnosis not present

## 2017-10-16 DIAGNOSIS — Z833 Family history of diabetes mellitus: Secondary | ICD-10-CM | POA: Diagnosis not present

## 2017-10-16 DIAGNOSIS — Z8249 Family history of ischemic heart disease and other diseases of the circulatory system: Secondary | ICD-10-CM | POA: Diagnosis not present

## 2017-10-16 DIAGNOSIS — Z794 Long term (current) use of insulin: Secondary | ICD-10-CM | POA: Diagnosis not present

## 2017-10-16 DIAGNOSIS — Z7982 Long term (current) use of aspirin: Secondary | ICD-10-CM

## 2017-10-16 MED ORDER — LISINOPRIL 40 MG PO TABS: 40.0000 mg | ORAL_TABLET | Freq: Every day | ORAL | 3 refills | Status: DC

## 2017-10-16 MED ORDER — METFORMIN HCL ER 500 MG PO TB24: 1000.0000 mg | ORAL_TABLET | Freq: Two times a day (BID) | ORAL | 3 refills | Status: DC

## 2017-10-16 MED ORDER — DILTIAZEM HCL ER COATED BEADS 180 MG PO CP24: 180.0000 mg | ORAL_CAPSULE | Freq: Every day | ORAL | 0 refills | Status: DC

## 2017-10-16 MED ORDER — ATORVASTATIN CALCIUM 10 MG PO TABS: 10.0000 mg | ORAL_TABLET | Freq: Every day | ORAL | 3 refills | Status: DC

## 2017-10-16 MED ORDER — LIRAGLUTIDE 18 MG/3ML ~~LOC~~ SOPN: 1.2000 mg | PEN_INJECTOR | Freq: Every day | SUBCUTANEOUS | 11 refills | Status: DC

## 2017-10-16 NOTE — Progress Notes (Addendum)
S: Amanda [name] is a 66 y.o. female reports to clinical pharmacist appointment for medication help  Allergies  Allergen Reactions  . Gabapentin     Pt states medication made her feel "out of it".     Medication Sig  acetaminophen (TYLENOL) 325 MG tablet Take 2 tablets (650 mg total) by mouth every 6 (six) hours as needed (or Fever >/= 101).  aspirin EC 81 MG tablet Take 81 mg by mouth daily.  atorvastatin (LIPITOR) 10 MG tablet Take 1 tablet (10 mg total) by mouth daily. D/c previous atorvastatin prescription  Capsaicin-Menthol-Methyl Sal (CAPSAICIN-METHYL SAL-MENTHOL) 0.025-1-12 % CREA Apply over painful areas  Cholecalciferol (VITAMIN D3) 2000 units capsule Take by mouth.  cyclobenzaprine (FLEXERIL) 5 MG tablet Take 1 tablet (5 mg total) by mouth 2 (two) times daily as needed for muscle spasms.  diltiazem (CARDIZEM CD) 180 MG 24 hr capsule Take 1 capsule (180 mg total) by mouth daily. IM program  dorzolamide-timolol (COSOPT) 22.3-6.8 MG/ML ophthalmic solution   latanoprost (XALATAN) 0.005 % ophthalmic solution 1 drop at bedtime.  liraglutide 18 MG/3ML SOPN Inject 0.2 mLs (1.2 mg total) into the skin daily. 0.6 mg daily x 1 week, then increase to 1.2 mg daily. PHARMACIST counsel, MAP program  lisinopril (PRINIVIL,ZESTRIL) 40 MG tablet Take 1 tablet (40 mg total) by mouth daily.  metFORMIN (GLUCOPHAGE XR) 500 MG 24 hr tablet Take 2 tablets (1,000 mg total) by mouth 2 (two) times daily with a meal. D/c previous metformin prescription  Multiple Vitamins-Minerals (WOMENS 50+ MULTI VITAMIN/MIN) TABS Take 1 tablet by mouth daily.   Past Medical History:  Diagnosis Date  . Allergy    SEASONAL  . Anemia   . Arthritis   . Cataract    PRESENT IN LEFT/ CATARACTS REMOVED FROM RIGHT  . Diabetes mellitus 1996  . DKA (diabetic ketoacidoses) ([place]) 01/02/2013  . Hyperlipidemia   . Hypertension   . Hypotension   . Influenza A (H1N1) 01/03/2013  . Neuromuscular disorder ([place])    NEUROPATHY  .  SYNCOPE 05/10/2010   Qualifier: Diagnosis of  By: Marilynne Halsted RN, BSN, Jacquelyn     Social History   Social History  . Marital status: Married    Spouse name: N/A  . Number of children: 1  . Years of education: N/A   Occupational History  . retired    Social History Main Topics  . Smoking status: Never Smoker  . Smokeless tobacco: Never Used  . Alcohol use No  . Drug use: No  . Sexual activity: Not on file   Other Topics Concern  . Not on file   Social History Narrative  . No narrative on file   Family History  Problem Relation Age of Onset  . Diabetes Mother   . Hypertension Mother   . Breast cancer Mother   . Aneurysm Father   . Diabetes Sister   . Hypertension Sister   . Kidney disease Sister        One Kidney transplant, one sister on HD  . Breast cancer Sister    O: Component Value Date/Time   CHOL 160 03/18/2014 0908   HDL 63 03/18/2014 0908   TRIG 43 03/18/2014 0908   AST 19 01/02/2013 0733   ALT 12 01/02/2013 0733   NA 145 (H) 08/04/2017 1155   K 5.4 (H) 08/04/2017 1155   CL 111 (H) 08/04/2017 1155   CO2 18 (L) 08/04/2017 1155   GLUCOSE 114 (H) 08/04/2017 1155   GLUCOSE 53 (  L) 06/16/2015 1553   HGBA1C 7.5 06/05/2017 1428   HGBA1C 15.8 (H) 01/02/2013 1705   BUN 33 (H) 08/04/2017 1155   CREATININE 1.33 (H) 08/04/2017 1155   CREATININE 0.97 06/16/2015 1553   CALCIUM 9.0 08/04/2017 1155   GFRNONAA 42 (L) 08/04/2017 1155   GFRNONAA 63 06/16/2015 1553   GFRAA 48 (L) 08/04/2017 1155   GFRAA 72 06/16/2015 1553   WBC WILL FOLLOW 06/05/2017 1401   WBC 3.4 (L) 04/05/2014 1410   HGB WILL FOLLOW 06/05/2017 1401   HCT WILL FOLLOW 06/05/2017 1401   PLT WILL FOLLOW 06/05/2017 1401   TSH 1.520 11/24/2015 1543   Ht Readings from Last 2 Encounters:  09/05/17 5\' 1"  (1.549 m)  05/03/17 5\' 1"  (1.549 m)   Wt Readings from Last 2 Encounters:  09/05/17 140 lb 4 oz (63.6 kg)  08/04/17 142 lb 4.8 oz (64.5 kg)   There is no height or weight on file to calculate  BMI. BP Readings from Last 3 Encounters:  09/05/17 110/70  08/04/17 (!) 155/69  06/14/17 127/74   A/P:  Patient with controlled HTN and DM reports status change and now has Medicare but is unable to afford Victoza, needs help transitioning her other medications. Sample of Victoza provided today and patient was referred to [facility] Extra Help program  Patient requested Victoza prescription sent to [facility]. Transferred per patient request.  Patient also informed about [facility] free generic program through [employer]. Patient requested prescription transfers, prescriptions sent to [facility] for lisinopril, metformin, atorvastatin, and diltiazem.  Patient reports no sign/symptoms or other concerns today.  An after visit summary was provided and patient advised to follow up if any changes in condition or questions regarding medications arise.  The patient verbalized understanding of information provided by repeating back concepts discussed.

## 2017-10-17 ENCOUNTER — Other Ambulatory Visit: Payer: Self-pay | Admitting: *Deleted

## 2017-10-17 MED ORDER — INSULIN PEN NEEDLE 33G X 5 MM MISC: 1.0000 | Freq: Every day | 3 refills | Status: DC

## 2017-10-23 ENCOUNTER — Encounter: Payer: Self-pay | Admitting: Internal Medicine

## 2017-10-23 ENCOUNTER — Ambulatory Visit (INDEPENDENT_AMBULATORY_CARE_PROVIDER_SITE_OTHER): Payer: Medicare Other | Admitting: Internal Medicine

## 2017-10-23 VITALS — BP 138/77 | HR 88 | Temp 98.2°F | Ht 61.0 in | Wt 139.1 lb

## 2017-10-23 DIAGNOSIS — I10 Essential (primary) hypertension: Secondary | ICD-10-CM

## 2017-10-23 DIAGNOSIS — B351 Tinea unguium: Secondary | ICD-10-CM | POA: Diagnosis not present

## 2017-10-23 DIAGNOSIS — E119 Type 2 diabetes mellitus without complications: Secondary | ICD-10-CM

## 2017-10-23 DIAGNOSIS — Z841 Family history of disorders of kidney and ureter: Secondary | ICD-10-CM | POA: Diagnosis not present

## 2017-10-23 DIAGNOSIS — Z803 Family history of malignant neoplasm of breast: Secondary | ICD-10-CM | POA: Diagnosis not present

## 2017-10-23 DIAGNOSIS — Z7984 Long term (current) use of oral hypoglycemic drugs: Secondary | ICD-10-CM

## 2017-10-23 DIAGNOSIS — E1142 Type 2 diabetes mellitus with diabetic polyneuropathy: Secondary | ICD-10-CM

## 2017-10-23 DIAGNOSIS — Z833 Family history of diabetes mellitus: Secondary | ICD-10-CM

## 2017-10-23 DIAGNOSIS — Z794 Long term (current) use of insulin: Secondary | ICD-10-CM

## 2017-10-23 DIAGNOSIS — Z79899 Other long term (current) drug therapy: Secondary | ICD-10-CM

## 2017-10-23 DIAGNOSIS — Z8249 Family history of ischemic heart disease and other diseases of the circulatory system: Secondary | ICD-10-CM

## 2017-10-23 HISTORY — DX: Tinea unguium: B35.1

## 2017-10-23 LAB — POCT GLYCOSYLATED HEMOGLOBIN (HGB A1C): Hemoglobin A1C: 8.7

## 2017-10-23 LAB — GLUCOSE, CAPILLARY: Glucose-Capillary: 136 mg/dL — ABNORMAL HIGH (ref 65–99)

## 2017-10-23 MED ORDER — CLOTRIMAZOLE 1 % EX CREA: 1.0000 "application " | TOPICAL_CREAM | Freq: Two times a day (BID) | CUTANEOUS | 0 refills | Status: AC

## 2017-10-23 NOTE — Patient Instructions (Addendum)
It was great to meet you today.  You are doing well.  Your A1C was elevated to 8.7 but we understand you've had a lot going on and have had trouble getting the victoza.  Lets continue with the current plan and see how the victoza, metformin and diet changes do.  Your repeat blood pressure was much better.  I'm inclined to continue the lisinopril 40mg  for now and see how you are doing in your next visit.  I have discontinued the cardizem also called diltiazem which you were not taking anyways.  If you notice your blood pressure continuing to run high at home please come in before your next scheduled appointment and we can evaluate it and see if we need to start amlodipine.  We have also written a prescription for a different antifungal that will hopefully be more affordable for your toenail fungus.

## 2017-10-23 NOTE — Progress Notes (Signed)
CC: T2DM and HTN follow up  HPI:  Amanda [name] is a 66 y.o. w/ PMH below Has had a tough time recently with her husband being in the hospital but does report taking her medications.  Not checking blood sugars like she normally would.  She reports readings of about 130 to 150's with the highest ever being around 180 but not frequently.  Has a podiatrist on [address] she saw two months ago who is treating foot fungus and cutting her toenails.    Please see A&P for status of the patient's chronic medical conditions  Past Medical History:  Diagnosis Date  . Allergy    SEASONAL  . Anemia   . Arthritis   . Cataract    PRESENT IN LEFT/ CATARACTS REMOVED FROM RIGHT  . Diabetes mellitus 1996  . DKA (diabetic ketoacidoses) ([place]) 01/02/2013  . Hyperlipidemia   . Hypertension   . Hypotension   . Influenza A (H1N1) 01/03/2013  . Neuromuscular disorder ([place])    NEUROPATHY  . SYNCOPE 05/10/2010   Qualifier: Diagnosis of  By: Marilynne Halsted, RN, BSN, Jacquelyn     Review of Systems:  ROS: Pulmonary: pt denies increased work of breathing, shortness of breath,  Cardiac: pt denies palpitations, chest pain,  Abdominal: pt denies abdominal pain, nausea, vomiting, or diarrhea  Physical Exam:  Vitals:   10/23/17 1407 10/23/17 1444  BP: (!) 172/82 138/77  Pulse: 90 88  Temp: 98.2 F (36.8 C)   TempSrc: Oral   SpO2: 100%   Weight: 139 lb 1.6 oz (63.1 kg)   Height: 5\' 1"  (1.549 m)    Physical Exam  Constitutional: No distress.  Cardiovascular: Normal rate, regular rhythm and normal heart sounds. Exam reveals no gallop and no friction rub.  No murmur heard. Pulmonary/Chest: Effort normal and breath sounds normal. No respiratory distress. She has no wheezes. She has no rales. She exhibits no tenderness.  Abdominal: Soft. Bowel sounds are normal. She exhibits no distension and no mass. There is no tenderness. There is no rebound and no guarding.  Neurological: She is alert.  Skin: She is not  diaphoretic.    Social History   Socioeconomic History  . Marital status: Married    Spouse name: Not on file  . Number of children: 1  . Years of education: Not on file  . Highest education level: Not on file  Social Needs  . Financial resource strain: Not on file  . Food insecurity - worry: Not on file  . Food insecurity - inability: Not on file  . Transportation needs - medical: Not on file  . Transportation needs - non-medical: Not on file  Occupational History  . Occupation: retired  Tobacco Use  . Smoking status: Never Smoker  . Smokeless tobacco: Never Used  Substance and Sexual Activity  . Alcohol use: No    Alcohol/week: 0.0 oz  . Drug use: No  . Sexual activity: Not on file  Other Topics Concern  . Not on file  Social History Narrative  . Not on file    Family History  Problem Relation Age of Onset  . Diabetes Mother   . Hypertension Mother   . Breast cancer Mother   . Aneurysm Father   . Diabetes Sister   . Hypertension Sister   . Kidney disease Sister        One Kidney transplant, one sister on HD  . Breast cancer Sister     Assessment & Plan:  See Encounters Tab for problem based charting.  Patient seen with Dr. Eppie Gibson

## 2017-10-23 NOTE — Assessment & Plan Note (Addendum)
Lab Results  Component Value Date   HGBA1C 8.7 10/23/2017   HGBA1C elevated about one point today but patient dealing with her husband being ill recently and has been focusing on taking care of him and not sticking to her diet.  Also pt Started on victoza one year ago but has Been off victoza for a few weeks due to cost but restarted with help of Dr. Maudie Mercury and has been on for a week now. Pt has eye exam scheduled for December.  Pts husband will be moved to rehab facility and this should give her more time to focus on her diet and exercise.    -Will continue current regimen of victoza 1.2mg  daily, metformin 2000mg  daily -pt will bring log of blood sugars next visit

## 2017-10-24 LAB — CMP14 + ANION GAP
ALT: 8 IU/L (ref 0–32)
AST: 12 IU/L (ref 0–40)
Albumin/Globulin Ratio: 1.5 (ref 1.2–2.2)
Albumin: 4 g/dL (ref 3.6–4.8)
Alkaline Phosphatase: 68 IU/L (ref 39–117)
Anion Gap: 14 mmol/L (ref 10.0–18.0)
BUN/Creatinine Ratio: 27 (ref 12–28)
BUN: 32 mg/dL — ABNORMAL HIGH (ref 8–27)
Bilirubin Total: 0.2 mg/dL (ref 0.0–1.2)
CO2: 20 mmol/L (ref 20–29)
Calcium: 9.4 mg/dL (ref 8.7–10.3)
Chloride: 107 mmol/L — ABNORMAL HIGH (ref 96–106)
Creatinine, Ser: 1.19 mg/dL — ABNORMAL HIGH (ref 0.57–1.00)
GFR calc Af Amer: 55 mL/min/{1.73_m2} — ABNORMAL LOW (ref >59)
GFR calc non Af Amer: 48 mL/min/{1.73_m2} — ABNORMAL LOW (ref >59)
Globulin, Total: 2.7 g/dL (ref 1.5–4.5)
Glucose: 141 mg/dL — ABNORMAL HIGH (ref 65–99)
Potassium: 5.2 mmol/L (ref 3.5–5.2)
Sodium: 141 mmol/L (ref 134–144)
Total Protein: 6.7 g/dL (ref 6.0–8.5)

## 2017-10-24 NOTE — Assessment & Plan Note (Addendum)
Pt has toe fungus under left first toenail.  Pt being followed by podiatry for toe fungus was given a cream but it was not covered by insurance.  -wrote for clotrimazole cream generic that will hopefully be covered by pts insurance.

## 2017-10-24 NOTE — Assessment & Plan Note (Signed)
BP Readings from Last 3 Encounters:  10/23/17 138/77  09/05/17 110/70  08/04/17 (!) 155/69   Blood pressure on recheck was within goal today.  Patient started on cardizem for BP last visit.  Pt never picked up prescription and blood pressure is fine today will discontinue this med.  If BP elevated on recheck I will start amlodipine 5mg  but nothing to do for now. Creatinine checked this visit and is coming down nicely.    -Continue lisinopril 40mg 

## 2017-10-27 NOTE — Progress Notes (Signed)
I saw and evaluated the patient.  I personally confirmed the key portions of Dr. [name]'s history and exam and reviewed pertinent patient test results.  The assessment, diagnosis, and plan were formulated together and I agree with the documentation in the resident's note. 

## 2017-11-15 ENCOUNTER — Telehealth: Payer: Self-pay

## 2017-11-15 NOTE — Telephone Encounter (Signed)
Faxed advanced diabetes supply @ [phone] on 11/15/2017.

## 2017-11-23 ENCOUNTER — Ambulatory Visit: Payer: Medicare Other | Admitting: Podiatry

## 2017-11-27 MED FILL — ATORVASTATIN 10 MG TABLET: 10 | 30 days supply | Qty: 30 | Fill #0

## 2017-12-01 ENCOUNTER — Encounter: Payer: Self-pay | Admitting: Podiatry

## 2017-12-01 ENCOUNTER — Ambulatory Visit: Payer: Medicare Other | Admitting: Podiatry

## 2017-12-01 DIAGNOSIS — M79605 Pain in left leg: Secondary | ICD-10-CM

## 2017-12-01 DIAGNOSIS — R29898 Other symptoms and signs involving the musculoskeletal system: Secondary | ICD-10-CM | POA: Diagnosis not present

## 2017-12-01 DIAGNOSIS — E1149 Type 2 diabetes mellitus with other diabetic neurological complication: Secondary | ICD-10-CM

## 2017-12-01 DIAGNOSIS — M79674 Pain in right toe(s): Secondary | ICD-10-CM

## 2017-12-01 DIAGNOSIS — M79604 Pain in right leg: Secondary | ICD-10-CM

## 2017-12-01 DIAGNOSIS — B351 Tinea unguium: Secondary | ICD-10-CM

## 2017-12-01 DIAGNOSIS — M79675 Pain in left toe(s): Secondary | ICD-10-CM

## 2017-12-04 ENCOUNTER — Other Ambulatory Visit: Payer: Self-pay | Admitting: Internal Medicine

## 2017-12-04 DIAGNOSIS — Z1231 Encounter for screening mammogram for malignant neoplasm of breast: Secondary | ICD-10-CM

## 2017-12-04 NOTE — Progress Notes (Signed)
Subjective: 66 y.o. returns the office today for painful, elongated, thickened toenails which she cannot trim herself. Denies any redness or drainage around the nails. She states that she has been getting some tiredness feeling to her legs when she walks but denies any cramping.  She does wear compression socks which is new and this does help.  She is never had her circulation checked.  Denies any acute changes since last appointment and no new complaints today. Denies any systemic complaints such as fevers, chills, nausea, vomiting.   PCP: [name], MD   Objective: AAO 3, NAD DP/PT pulses palpable, CRT less than 3 seconds Nails hypertrophic, dystrophic, elongated, brittle, discolored 10. There is tenderness overlying the nails 1-5 bilaterally. There is no surrounding erythema or drainage along the nail sites. No open lesions or pre-ulcerative lesions are identified. No other areas of tenderness bilateral lower extremities. No overlying edema, erythema, increased warmth. No pain with calf compression, swelling, warmth, erythema.  Assessment: Patient presents with symptomatic onychomycosis; leg pain/fatigue  Plan: -Treatment options including alternatives, risks, complications were discussed -Nails sharply debrided 10 without complication/bleeding. -I did an ABI in the office today to make sure her circulation is adequate.  The left side was 1.2 and the right side was 1.23.  I do not feel that her leg fatigue, discomfort is arterial in nature.  Discussed can continue with compression socks and encouraged exercise and walking.  If symptoms worsen to let me know. -Discussed daily foot inspection. If there are any changes, to call the office immediately.  -Follow-up in 3 months or sooner if any problems are to arise. In the meantime, encouraged to call the office with any questions, concerns, changes symptoms.  Celesta Gentile, DPM

## 2017-12-25 ENCOUNTER — Ambulatory Visit (INDEPENDENT_AMBULATORY_CARE_PROVIDER_SITE_OTHER): Payer: Medicare Other | Admitting: Gastroenterology

## 2017-12-25 ENCOUNTER — Other Ambulatory Visit (INDEPENDENT_AMBULATORY_CARE_PROVIDER_SITE_OTHER): Payer: Medicare Other

## 2017-12-25 ENCOUNTER — Encounter: Payer: Self-pay | Admitting: Gastroenterology

## 2017-12-25 VITALS — BP 130/80 | HR 76 | Ht 61.5 in | Wt 136.0 lb

## 2017-12-25 DIAGNOSIS — R109 Unspecified abdominal pain: Secondary | ICD-10-CM | POA: Diagnosis not present

## 2017-12-25 DIAGNOSIS — R634 Abnormal weight loss: Secondary | ICD-10-CM

## 2017-12-25 LAB — COMPREHENSIVE METABOLIC PANEL
ALT: 7 U/L (ref 0–35)
AST: 11 U/L (ref 0–37)
Albumin: 3.7 g/dL (ref 3.5–5.2)
Alkaline Phosphatase: 52 U/L (ref 39–117)
BUN: 26 mg/dL — ABNORMAL HIGH (ref 6–23)
CO2: 27 mEq/L (ref 19–32)
Calcium: 9.1 mg/dL (ref 8.4–10.5)
Chloride: 106 mEq/L (ref 96–112)
Creatinine, Ser: 1.17 mg/dL (ref 0.40–1.20)
GFR: 59.62 mL/min — ABNORMAL LOW (ref >60.00)
Glucose, Bld: 187 mg/dL — ABNORMAL HIGH (ref 70–99)
Potassium: 4.1 mEq/L (ref 3.5–5.1)
Sodium: 140 mEq/L (ref 135–145)
Total Bilirubin: 0.4 mg/dL (ref 0.2–1.2)
Total Protein: 6.5 g/dL (ref 6.0–8.3)

## 2017-12-25 LAB — CBC WITH DIFFERENTIAL/PLATELET
Basophils Absolute: 0 10*3/uL (ref 0.0–0.1)
Basophils Relative: 0.5 % (ref 0.0–3.0)
Eosinophils Absolute: 0.1 10*3/uL (ref 0.0–0.7)
Eosinophils Relative: 1 % (ref 0.0–5.0)
HCT: 30.3 % — ABNORMAL LOW (ref 36.0–46.0)
Hemoglobin: 10.3 g/dL — ABNORMAL LOW (ref 12.0–15.0)
Lymphocytes Relative: 32.9 % (ref 12.0–46.0)
Lymphs Abs: 1.8 10*3/uL (ref 0.7–4.0)
MCHC: 34.2 g/dL (ref 30.0–36.0)
MCV: 88.7 fl (ref 78.0–100.0)
Monocytes Absolute: 0.4 10*3/uL (ref 0.1–1.0)
Monocytes Relative: 7.4 % (ref 3.0–12.0)
Neutro Abs: 3.2 10*3/uL (ref 1.4–7.7)
Neutrophils Relative %: 58.2 % (ref 43.0–77.0)
Platelets: 324 10*3/uL (ref 150.0–400.0)
RBC: 3.41 Mil/uL — ABNORMAL LOW (ref 3.87–5.11)
RDW: 13.2 % (ref 11.5–15.5)
WBC: 5.4 10*3/uL (ref 4.0–10.5)

## 2017-12-25 NOTE — Progress Notes (Signed)
Review of pertinent gastrointestinal problems: 1. Adenomatous colon polyps, colonoscopy June 2015 Dr. Ardis Hughs for chronic intermittent diarrhea found two subcentimeter adenomas. Recommended recall at 5 year interval. 2. Diverticulosis 3. Chronic intermittent diarrhea, colonoscopy June 2015 found normal terminal ileum, random biopsies of the colon showed no microscopic colitis.   Fiber seemed to help in 2015   HPI: This is a very pleasant 67 year old woman whom I last saw several months ago.  At that time she was complaining of alternating constipation and diarrhea.  She had had a pretty recent colonoscopy and I recommended a trial of fiber supplements.  She did try those and they helped for 2 or 3 months but she is actually having more diarrheal episodes now.  Sometimes she has to get to the bathroom quite quickly when she is out of the house even.  She has seen no overt rectal bleeding.  She has abdominal pain periumbilically.  These are constant but can be related to body position twisting back and forth.  She has also lost 5-7 pounds in the past 2 or 3 months without intention.   She has had no fevers or chills  Chief complaint is abdominal pain, loose stools, weight loss    ROS: complete GI ROS as described in HPI, all other review negative.  Constitutional:  No unintentional weight loss   Past Medical History:  Diagnosis Date  . Allergy    SEASONAL  . Anemia   . Arthritis   . Cataract    PRESENT IN LEFT/ CATARACTS REMOVED FROM RIGHT  . Diabetes mellitus 1996  . DKA (diabetic ketoacidoses) ([place]) 01/02/2013  . Hyperlipidemia   . Hypertension   . Hypotension   . Influenza A (H1N1) 01/03/2013  . Neuromuscular disorder ([place])    NEUROPATHY  . SYNCOPE 05/10/2010   Qualifier: Diagnosis of  By: Marilynne Halsted RN, BSN, Jacquelyn      Past Surgical History:  Procedure Laterality Date  . ABDOMINAL HYSTERECTOMY     10 years ago   . Cataracts    . ROTATOR CUFF REPAIR    . SHOULDER SURGERY      Rotator cuff tear     Current Outpatient Medications  Medication Sig Dispense Refill  . acetaminophen (TYLENOL) 325 MG tablet Take 2 tablets (650 mg total) by mouth every 6 (six) hours as needed (or Fever >/= 101). 60 tablet 0  . aspirin EC 81 MG tablet Take 81 mg by mouth daily.    [name] atorvastatin (LIPITOR) 10 MG tablet Take 1 tablet (10 mg total) by mouth daily. 90 tablet 3  . Capsaicin-Menthol-Methyl Sal (CAPSAICIN-METHYL SAL-MENTHOL) 0.025-1-12 % CREA Apply over painful areas 1 Tube 3  . Cholecalciferol (VITAMIN D3) 2000 units capsule Take by mouth.    . cyclobenzaprine (FLEXERIL) 5 MG tablet Take 1 tablet (5 mg total) by mouth 2 (two) times daily as needed for muscle spasms. 30 tablet 0  . dorzolamide-timolol (COSOPT) 22.3-6.8 MG/ML ophthalmic solution     . Insulin Pen Needle 33G X 5 MM MISC 1 applicator by Does not apply route daily. Use to inject insulin once daily. Diagnosis code E11.42, Z79.4 100 each 3  . latanoprost (XALATAN) 0.005 % ophthalmic solution 1 drop at bedtime.    . liraglutide 18 MG/3ML SOPN Inject 0.2 mLs (1.2 mg total) into the skin daily. 6 mL 11  . lisinopril (PRINIVIL,ZESTRIL) 40 MG tablet Take 1 tablet (40 mg total) by mouth daily. 90 tablet 3  . metFORMIN (GLUCOPHAGE XR) 500 MG 24  hr tablet Take 2 tablets (1,000 mg total) by mouth 2 (two) times daily with a meal. 360 tablet 3  . Multiple Vitamins-Minerals (WOMENS 50+ MULTI VITAMIN/MIN) TABS Take 1 tablet by mouth daily. 30 tablet    No current facility-administered medications for this visit.     Allergies as of 12/25/2017 - Review Complete 12/25/2017  Allergen Reaction Noted  . Gabapentin  01/17/2014    Family History  Problem Relation Age of Onset  . Diabetes Mother   . Hypertension Mother   . Breast cancer Mother   . Aneurysm Father   . Diabetes Sister   . Hypertension Sister   . Kidney disease Sister        One Kidney transplant, one sister on HD  . Breast cancer Sister     Social History    Socioeconomic History  . Marital status: Married    Spouse name: Not on file  . Number of children: 1  . Years of education: Not on file  . Highest education level: Not on file  Social Needs  . Financial resource strain: Not on file  . Food insecurity - worry: Not on file  . Food insecurity - inability: Not on file  . Transportation needs - medical: Not on file  . Transportation needs - non-medical: Not on file  Occupational History  . Occupation: retired  Tobacco Use  . Smoking status: Never Smoker  . Smokeless tobacco: Never Used  Substance and Sexual Activity  . Alcohol use: No    Alcohol/week: 0.0 oz  . Drug use: No  . Sexual activity: Not on file  Other Topics Concern  . Not on file  Social History Narrative  . Not on file     Physical Exam: BP 130/80   Pulse 76   Ht 5' 1.5" (1.562 m)   Wt 136 lb (61.7 kg)   BMI 25.28 kg/m  Constitutional: generally well-appearing Psychiatric: alert and oriented x3 Abdomen: soft, mildly tender throughout nondistended, no obvious ascites, no peritoneal signs, normal bowel sounds No peripheral edema noted in lower extremities  Assessment and plan: 67 y.o. female with loose stools, abdominal pain, unintentional weight loss  Previously I had recommended fiber supplements and they seem to help for 2 or 3 months but now I think some of her loose stools may be related to the fiber.  I recommended she stop the fiber supplements for now to see if that helps even out her stools.  Bit concerned with her unintentional weight loss her mild abdominal pains and I am getting a CT scan abdomen pelvis and a set of labs including a C met and CBC.  Please see the "Patient Instructions" section for addition details about the plan.  Owens Loffler, MD [facility] 12/25/2017, 2:05 PM

## 2017-12-25 NOTE — Patient Instructions (Addendum)
You will be set up for a CT scan of abdomen and pelvis with IV and oral contrast (for abdominal pain, weight loss).  You have been scheduled for a CT scan of the abdomen and pelvis at [facility] ([address]---this is in the same building as Press [facility]).   You are scheduled on 12/26/17 at 100 pm. You should arrive 15 minutes prior to your appointment time for registration. Please follow the written instructions below on the day of your exam:  WARNING: IF YOU ARE ALLERGIC TO IODINE/X-RAY DYE, PLEASE NOTIFY RADIOLOGY IMMEDIATELY AT [phone]! YOU WILL BE GIVEN A 13 HOUR PREMEDICATION PREP.  1) Do not eat or drink anything after 9 am (4 hours prior to your test) 2) You have been given 2 bottles of oral contrast to drink. The solution may taste better if refrigerated, but do NOT add ice or any other liquid to this solution. Shake well before drinking.    Drink 1 bottle of contrast @ 11 am (2 hours prior to your exam)  Drink 1 bottle of contrast @ 12 noon (1 hour prior to your exam)  You may take any medications as prescribed with a small amount of water except for the following: Metformin, Glucophage, Glucovance, Avandamet, Riomet, Fortamet, Actoplus Met, Janumet, Glumetza or Metaglip. The above medications must be held the day of the exam AND 48 hours after the exam.  The purpose of you drinking the oral contrast is to aid in the visualization of your intestinal tract. The contrast solution may cause some diarrhea. Before your exam is started, you will be given a small amount of fluid to drink. Depending on your individual set of symptoms, you may also receive an intravenous injection of x-ray contrast/dye. Plan on being at [facility] for 30 minutes or longer, depending on the type of exam you are having performed.  This test typically takes 30-45 minutes to complete.  If you have any questions regarding your exam or if you need to reschedule, you may call the CT  department at [phone] between the hours of 8:00 am and 5:00 pm, Monday-Friday.  ________________________________________________________________   Dennis Bast will have labs checked today in the basement lab.  Please head down after you check out with the front desk  (cbc, cmet).  Stop your fiber supplement for now.  Normal BMI (Body Mass Index- based on height and weight) is between 19 and 25. Your BMI today is Body mass index is 25.28 kg/m. [name] Please consider follow up  regarding your BMI with your Primary Care Provider.

## 2017-12-26 ENCOUNTER — Ambulatory Visit (INDEPENDENT_AMBULATORY_CARE_PROVIDER_SITE_OTHER)
Admission: RE | Admit: 2017-12-26 | Discharge: 2017-12-26 | Disposition: A | Discharge status: Home / Self Care | Payer: Medicare Other | Source: Ambulatory Visit | Attending: Gastroenterology | Admitting: Gastroenterology

## 2017-12-26 DIAGNOSIS — R109 Unspecified abdominal pain: Secondary | ICD-10-CM | POA: Diagnosis not present

## 2017-12-26 DIAGNOSIS — R634 Abnormal weight loss: Secondary | ICD-10-CM | POA: Diagnosis not present

## 2017-12-26 MED ORDER — IOPAMIDOL (ISOVUE-300) INJECTION 61%
100.0000 mL | Freq: Once | INTRAVENOUS | Status: AC | PRN
  Administered 2017-12-26: 100 mL via INTRAVENOUS

## 2018-01-01 ENCOUNTER — Telehealth: Payer: Self-pay | Admitting: Gastroenterology

## 2018-01-01 NOTE — Telephone Encounter (Signed)
Milus Banister, MD  Jeoffrey Massed, RN          Please call the patient. CT scan is unrevealing, labs show mild chronic anemia (but stable for past 4 years). Should continue with the suggestions outlined at recent visit, rov with me in 2-3 months for weight check, update on her bowels. Would consider further testing (colonoscopy) at that point if she is doing poorly. thanks    The pt has been advised of the results and f/u scheduled.

## 2018-01-02 ENCOUNTER — Ambulatory Visit
Admission: RE | Admit: 2018-01-02 | Discharge: 2018-01-02 | Disposition: A | Discharge status: Home / Self Care | Payer: Medicare Other | Source: Ambulatory Visit | Attending: Hematology and Oncology | Admitting: Hematology and Oncology

## 2018-01-02 DIAGNOSIS — Z1231 Encounter for screening mammogram for malignant neoplasm of breast: Secondary | ICD-10-CM

## 2018-01-19 ENCOUNTER — Ambulatory Visit: Payer: Medicare Other | Admitting: Pharmacist

## 2018-01-22 ENCOUNTER — Ambulatory Visit: Payer: Medicare Other | Admitting: Pharmacist

## 2018-01-29 ENCOUNTER — Ambulatory Visit: Payer: Medicare Other | Admitting: Pharmacist

## 2018-01-29 ENCOUNTER — Other Ambulatory Visit: Payer: Self-pay

## 2018-01-29 ENCOUNTER — Encounter: Payer: Medicare Other | Admitting: Internal Medicine

## 2018-01-29 ENCOUNTER — Ambulatory Visit (INDEPENDENT_AMBULATORY_CARE_PROVIDER_SITE_OTHER): Payer: Medicare Other | Admitting: Internal Medicine

## 2018-01-29 ENCOUNTER — Encounter: Payer: Self-pay | Admitting: Internal Medicine

## 2018-01-29 VITALS — BP 116/64 | HR 84 | Temp 97.5°F | Ht 61.0 in | Wt 138.0 lb

## 2018-01-29 DIAGNOSIS — Z23 Encounter for immunization: Secondary | ICD-10-CM | POA: Diagnosis not present

## 2018-01-29 DIAGNOSIS — Z7984 Long term (current) use of oral hypoglycemic drugs: Secondary | ICD-10-CM

## 2018-01-29 DIAGNOSIS — E1140 Type 2 diabetes mellitus with diabetic neuropathy, unspecified: Secondary | ICD-10-CM | POA: Diagnosis not present

## 2018-01-29 DIAGNOSIS — E785 Hyperlipidemia, unspecified: Secondary | ICD-10-CM

## 2018-01-29 DIAGNOSIS — I10 Essential (primary) hypertension: Secondary | ICD-10-CM

## 2018-01-29 DIAGNOSIS — Z833 Family history of diabetes mellitus: Secondary | ICD-10-CM

## 2018-01-29 DIAGNOSIS — Z79899 Other long term (current) drug therapy: Secondary | ICD-10-CM

## 2018-01-29 DIAGNOSIS — Z794 Long term (current) use of insulin: Secondary | ICD-10-CM

## 2018-01-29 DIAGNOSIS — E1142 Type 2 diabetes mellitus with diabetic polyneuropathy: Secondary | ICD-10-CM

## 2018-01-29 DIAGNOSIS — J302 Other seasonal allergic rhinitis: Secondary | ICD-10-CM

## 2018-01-29 LAB — POCT GLYCOSYLATED HEMOGLOBIN (HGB A1C): Hemoglobin A1C: 10.7

## 2018-01-29 LAB — GLUCOSE, CAPILLARY: Glucose-Capillary: 195 mg/dL — ABNORMAL HIGH (ref 65–99)

## 2018-01-29 MED ORDER — FLUTICASONE PROPIONATE 50 MCG/ACT NA SUSP: 1.0000 | Freq: Every day | NASAL | 2 refills | Status: DC

## 2018-01-29 MED ORDER — DULOXETINE HCL 20 MG PO CPEP: 40.0000 mg | ORAL_CAPSULE | Freq: Every day | ORAL | 3 refills | Status: DC

## 2018-01-29 MED ORDER — ATORVASTATIN CALCIUM 40 MG PO TABS: 40.0000 mg | ORAL_TABLET | Freq: Every day | ORAL | 3 refills | Status: DC

## 2018-01-29 MED FILL — DULoxetine HCL 20 MG CPEP: 20 | 15 days supply | Qty: 30 | Fill #0

## 2018-01-29 MED FILL — FLUTICASONE PROP 50 MCG SPR: 50 | 30 days supply | Qty: 16 | Fill #0

## 2018-01-29 MED FILL — ATORVASTATIN 40 MG TABLET: 40 | 90 days supply | Qty: 90 | Fill #0

## 2018-01-29 NOTE — Patient Instructions (Addendum)
Amanda [name], your A1C was 10.9 this visit, it has continued to go up.  I'm concerned about this.  Please take your blood sugars at home for the next few weeks fasting, 2 hours after a meal and before bed time and work on the diet changes we discussed.  Come back with your meter and we can re evaluate what to do from there with managing your diabetes.  I have increased your dosage of atorvastatin to 40mg  and prescribed you a new medication called cymbalta to help with your diabetic neuropathy.

## 2018-01-29 NOTE — Progress Notes (Signed)
CC: T2DM, HLD, diabetic neuropathy  HPI:  Amanda [name] is a 67 y.o. female who is here to follow up on her T2DM, HLD, Diabetic neuropathy.  Her main complaint today is the diabetic neuropathy it is been worse recently.  She admits that her diet has been very poor as she has been taking care of her sick mother as of late.    Please see A&P for status of the patient's chronic medical conditions  Past Medical History:  Diagnosis Date  . Allergy    SEASONAL  . Anemia   . Arthritis   . Cataract    PRESENT IN LEFT/ CATARACTS REMOVED FROM RIGHT  . Diabetes mellitus 1996  . DKA (diabetic ketoacidoses) ([place]) 01/02/2013  . Hyperlipidemia   . Hypertension   . Hypotension   . Influenza A (H1N1) 01/03/2013  . Neuromuscular disorder ([place])    NEUROPATHY  . SYNCOPE 05/10/2010   Qualifier: Diagnosis of  By: Marilynne Halsted, RN, BSN, Jacquelyn     Review of Systems:  ROS: Pulmonary: pt denies increased work of breathing, shortness of breath,  Cardiac: pt denies palpitations, chest pain,  Abdominal: pt denies abdominal pain, nausea, vomiting, or diarrhea  Physical Exam:  Vitals:   01/29/18 1536 01/29/18 1641  BP: (!) 154/80 116/64  Pulse: 88 84  Temp: (!) 97.5 F (36.4 C)   TempSrc: Oral   SpO2: 100%   Weight: 138 lb (62.6 kg)   Height: 5\' 1"  (1.549 m)    Physical Exam  Constitutional: She is oriented to person, place, and time. No distress.  Neck: No JVD present.  Cardiovascular: Normal rate, regular rhythm and normal heart sounds. Exam reveals no gallop and no friction rub.  No murmur heard. Pulmonary/Chest: Effort normal and breath sounds normal. No respiratory distress. She has no wheezes. She has no rales. She exhibits no tenderness.  Abdominal: Soft. Bowel sounds are normal. She exhibits no distension and no mass. There is no tenderness. There is no rebound and no guarding.  Musculoskeletal: She exhibits no edema.  Neurological: She is alert and oriented to person, place, and  time.  Skin: She is not diaphoretic.    Social History   Socioeconomic History  . Marital status: Married    Spouse name: Not on file  . Number of children: 1  . Years of education: Not on file  . Highest education level: Not on file  Social Needs  . Financial resource strain: Not on file  . Food insecurity - worry: Not on file  . Food insecurity - inability: Not on file  . Transportation needs - medical: Not on file  . Transportation needs - non-medical: Not on file  Occupational History  . Occupation: retired  Tobacco Use  . Smoking status: Never Smoker  . Smokeless tobacco: Never Used  Substance and Sexual Activity  . Alcohol use: No    Alcohol/week: 0.0 oz  . Drug use: No  . Sexual activity: Not on file  Other Topics Concern  . Not on file  Social History Narrative  . Not on file    Family History  Problem Relation Age of Onset  . Diabetes Mother   . Hypertension Mother   . Breast cancer Mother   . Aneurysm Father   . Diabetes Sister   . Hypertension Sister   . Kidney disease Sister        One Kidney transplant, one sister on HD  . Breast cancer Sister     Assessment &  Plan:   See Encounters Tab for problem based charting.  Patient discussed with Dr. Dareen Piano

## 2018-01-30 LAB — LIPID PANEL
Chol/HDL Ratio: 2.3 ratio (ref 0.0–4.4)
Cholesterol, Total: 158 mg/dL (ref 100–199)
HDL: 68 mg/dL (ref >39)
LDL Calculated: 80 mg/dL (ref 0–99)
Triglycerides: 51 mg/dL (ref 0–149)
VLDL Cholesterol Cal: 10 mg/dL (ref 5–40)

## 2018-01-30 NOTE — Assessment & Plan Note (Signed)
Lab Results  Component Value Date   CHOL 158 01/29/2018   HDL 68 01/29/2018   LDLCALC 80 01/29/2018   TRIG 51 01/29/2018   CHOLHDL 2.3 01/29/2018   Repeated patient's lipid profile as she was only on atorvastatin 10mg  and has considerable ASCVD risk.  -increased Lipitor to 40mg  daily

## 2018-01-30 NOTE — Assessment & Plan Note (Signed)
BP Readings from Last 3 Encounters:  01/29/18 116/64  12/25/17 130/80  10/23/17 138/77   Blood pressure well within goal today.  -continue lisinopril 40mg 

## 2018-01-30 NOTE — Assessment & Plan Note (Addendum)
A1C today was 10.7, this is increased from 8.7 on her last visit.  She usually is able to keep her A1c closer to 7.0.  Unfortunately she has been gradually heading in the wrong direction.  She admits her diet has been extremely poor recently she has been eating and drinking all the wrong things and she has been cutting corners due to having less time now that she takes care of her mother.  Unfortunately affordability of her medications is an issue Dr. Flossie Dibble has been working with her to get her more consistent Victoza she has only had access for the last few weeks.  I considered adding some mealtime insulin but the patient did not bring her meter and can only give me subjective report of what her blood sugars have been doing.  Additionally, she has been complaining of worsening neuropathy of the hands and feet but especially the feet and lower legs.  The symptoms are not suggestive of claudication and the patient had normal ABIs about 1 month prior.    -discussed dietary modification with patient and gave a diabetic diet plan handout from [employer] -referral placed for formal diabetic education with donna plyler -For now we will continue Metformin 1000 mg twice daily, Victoza 1.2 mg daily.  Patient must bring [place] a log of her blood sugars I have instructed her on when and how to take the sugars and to please bring her meter in her next visit. -Explained that the most important way to control her neuropathy would be to control her blood sugar however we will give a trial of Cymbalta for her neuroapthy.

## 2018-02-09 ENCOUNTER — Encounter: Payer: Medicare Other | Admitting: Dietician

## 2018-02-12 NOTE — Progress Notes (Signed)
[facility] Medicine Clinic Attending  Case discussed with Dr. [name]  at the time of the visit.  We reviewed the resident's history and exam and pertinent patient test results.  I agree with the assessment, diagnosis, and plan of care documented in the resident's note.  

## 2018-02-20 DIAGNOSIS — E113513 Type 2 diabetes mellitus with proliferative diabetic retinopathy with macular edema, bilateral: Secondary | ICD-10-CM | POA: Diagnosis not present

## 2018-02-20 DIAGNOSIS — H40053 Ocular hypertension, bilateral: Secondary | ICD-10-CM | POA: Diagnosis not present

## 2018-02-20 DIAGNOSIS — H4312 Vitreous hemorrhage, left eye: Secondary | ICD-10-CM | POA: Diagnosis not present

## 2018-02-20 DIAGNOSIS — Z961 Presence of intraocular lens: Secondary | ICD-10-CM | POA: Diagnosis not present

## 2018-02-20 DIAGNOSIS — H26491 Other secondary cataract, right eye: Secondary | ICD-10-CM | POA: Diagnosis not present

## 2018-02-20 DIAGNOSIS — Z794 Long term (current) use of insulin: Secondary | ICD-10-CM | POA: Diagnosis not present

## 2018-02-20 LAB — HM DIABETES EYE EXAM

## 2018-02-27 MED FILL — LATANOPROST 0.005% EYE DROP: 0.005 | 25 days supply | Qty: 3 | Fill #0

## 2018-03-01 ENCOUNTER — Encounter: Payer: Self-pay | Admitting: Podiatry

## 2018-03-01 ENCOUNTER — Ambulatory Visit: Payer: Medicare Other | Admitting: Podiatry

## 2018-03-01 DIAGNOSIS — B351 Tinea unguium: Secondary | ICD-10-CM

## 2018-03-01 DIAGNOSIS — M79675 Pain in left toe(s): Secondary | ICD-10-CM

## 2018-03-01 DIAGNOSIS — E1149 Type 2 diabetes mellitus with other diabetic neurological complication: Secondary | ICD-10-CM

## 2018-03-01 DIAGNOSIS — M79674 Pain in right toe(s): Secondary | ICD-10-CM

## 2018-03-01 NOTE — Progress Notes (Signed)
Subjective: 67 y.o. returns the office today for painful, elongated, thickened toenails which she cannot trim herself. Denies any redness or drainage around the nails. She has no new concerns today. Denies any acute changes since last appointment and no new complaints today. Denies any systemic complaints such as fevers, chills, nausea, vomiting.   PCP: [name], MD   Objective: AAO 3, NAD DP/PT pulses palpable, CRT less than 3 seconds Nails hypertrophic, dystrophic, elongated, brittle, discolored 10. There is tenderness overlying the nails 1-5 bilaterally. There is no surrounding erythema or drainage along the nail sites. No open lesions or pre-ulcerative lesions are identified. No other areas of tenderness bilateral lower extremities. No overlying edema, erythema, increased warmth. No pain with calf compression, swelling, warmth, erythema.  Assessment: Patient presents with symptomatic onychomycosis; leg pain/fatigue  Plan: -Treatment options including alternatives, risks, complications were discussed -Nails sharply debrided 10 without complication/bleeding. -Discussed daily foot inspection. If there are any changes, to call the office immediately.  -Follow-up in 3 months or sooner if any problems are to arise. In the meantime, encouraged to call the office with any questions, concerns, changes symptoms.  Celesta Gentile, DPM

## 2018-03-02 ENCOUNTER — Ambulatory Visit: Payer: Medicare Other | Admitting: Gastroenterology

## 2018-03-02 ENCOUNTER — Encounter: Payer: Self-pay | Admitting: Gastroenterology

## 2018-03-02 VITALS — BP 134/64 | HR 88 | Ht 61.75 in | Wt 136.1 lb

## 2018-03-02 DIAGNOSIS — R197 Diarrhea, unspecified: Secondary | ICD-10-CM | POA: Diagnosis not present

## 2018-03-02 NOTE — Patient Instructions (Addendum)
Call if the diarrhea episodes worsen.  Normal BMI (Body Mass Index- based on height and weight) is between 23 and 30. Your BMI today is Body mass index is 25.1 kg/m. [name] Please consider follow up  regarding your BMI with your Primary Care Provider.

## 2018-03-02 NOTE — Progress Notes (Signed)
Review of pertinent gastrointestinal problems: 1.Adenomatous colon polyps,colonoscopy June 2015 Dr. Ardis Hughs for chronic intermittent diarrhea found twosubcentimeter adenomas. Recommended recall at 5 year interval. 2.Diverticulosis 3.Chronic intermittent diarrhea,colonoscopy June 2015 found normal terminal ileum, random biopsies of the colon showed no microscopic colitis. Fiber seemed to helpin 2015 4. Abdominal pain, mild unintentional weight loss workup 12/2017: labs 1/2019cmet normal, Hb 10.3 (relatively stable vs previous 4 years).  CT scan with IV and po contrast 12/2017: unrevealing except large stool burden    HPI: This is a very pleasant 67 year old woman whom I last saw about 2 months ago.  Chief complaint is intermittent diarrhea episodes  Hb A1C 01/2018 was 10.7  I want her to come back to see how she responded to cutting out fiber supplements in terms of her intermittent episodes of diarrhea.  I also wanted a weight check since she had been losing a bit of weight unintentionally.  Her weight today is exactly the same as it was 2-3 months ago.  Her diarrhea episodes have significantly improved.  She was having 3 episodes per week, but since cutting back on her fiber supplements she is down to about 2 episodes per month only.  This is a big improvement.  ROS: complete GI ROS as described in HPI, all other review negative.  Constitutional:  No unintentional weight loss (weight unchanged vs 2 months ago, same scale her in GI office)   Past Medical History:  Diagnosis Date  . Allergy    SEASONAL  . Anemia   . Arthritis   . Cataract    PRESENT IN LEFT/ CATARACTS REMOVED FROM RIGHT  . Diabetes mellitus 1996  . DKA (diabetic ketoacidoses) ([place]) 01/02/2013  . Hyperlipidemia   . Hypertension   . Hypotension   . Influenza A (H1N1) 01/03/2013  . Neuromuscular disorder ([place])    NEUROPATHY  . SYNCOPE 05/10/2010   Qualifier: Diagnosis of  By: Marilynne Halsted RN, BSN, Jacquelyn      Past  Surgical History:  Procedure Laterality Date  . ABDOMINAL HYSTERECTOMY     10 years ago   . BREAST BIOPSY    . Cataracts    . ROTATOR CUFF REPAIR    . SHOULDER SURGERY     Rotator cuff tear     Current Outpatient Medications  Medication Sig Dispense Refill  . acetaminophen (TYLENOL) 325 MG tablet Take 2 tablets (650 mg total) by mouth every 6 (six) hours as needed (or Fever >/= 101). 60 tablet 0  . aspirin EC 81 MG tablet Take 81 mg by mouth daily.    [name] atorvastatin (LIPITOR) 40 MG tablet Take 1 tablet (40 mg total) by mouth daily. 90 tablet 3  . Capsaicin-Menthol-Methyl Sal (CAPSAICIN-METHYL SAL-MENTHOL) 0.025-1-12 % CREA Apply over painful areas 1 Tube 3  . Cholecalciferol (VITAMIN D3) 2000 units capsule Take by mouth.    . cyclobenzaprine (FLEXERIL) 5 MG tablet Take 1 tablet (5 mg total) by mouth 2 (two) times daily as needed for muscle spasms. 30 tablet 0  . dorzolamide-timolol (COSOPT) 22.3-6.8 MG/ML ophthalmic solution     . DULoxetine (CYMBALTA) 20 MG capsule Take 2 capsules (40 mg total) by mouth daily. 30 capsule 3  . fluticasone (FLONASE) 50 MCG/ACT nasal spray Place 1-2 sprays into both nostrils daily. 16 g 2  . Insulin Pen Needle 33G X 5 MM MISC 1 applicator by Does not apply route daily. Use to inject insulin once daily. Diagnosis code E11.42, Z79.4 100 each 3  . latanoprost (XALATAN) 0.005 %  ophthalmic solution 1 drop at bedtime.    . liraglutide 18 MG/3ML SOPN Inject 0.2 mLs (1.2 mg total) into the skin daily. 6 mL 11  . lisinopril (PRINIVIL,ZESTRIL) 40 MG tablet Take 1 tablet (40 mg total) by mouth daily. 90 tablet 3  . metFORMIN (GLUCOPHAGE XR) 500 MG 24 hr tablet Take 2 tablets (1,000 mg total) by mouth 2 (two) times daily with a meal. 360 tablet 3  . Multiple Vitamins-Minerals (WOMENS 50+ MULTI VITAMIN/MIN) TABS Take 1 tablet by mouth daily. 30 tablet    No current facility-administered medications for this visit.     Allergies as of 03/02/2018 - Review Complete  03/02/2018  Allergen Reaction Noted  . Gabapentin  01/17/2014    Family History  Problem Relation Age of Onset  . Diabetes Mother   . Hypertension Mother   . Breast cancer Mother   . Aneurysm Father   . Diabetes Sister   . Hypertension Sister   . Kidney disease Sister        One Kidney transplant, one sister on HD  . Breast cancer Sister     Social History   Socioeconomic History  . Marital status: Married    Spouse name: Not on file  . Number of children: 1  . Years of education: Not on file  . Highest education level: Not on file  Social Needs  . Financial resource strain: Not on file  . Food insecurity - worry: Not on file  . Food insecurity - inability: Not on file  . Transportation needs - medical: Not on file  . Transportation needs - non-medical: Not on file  Occupational History  . Occupation: retired  Tobacco Use  . Smoking status: Never Smoker  . Smokeless tobacco: Never Used  Substance and Sexual Activity  . Alcohol use: No    Alcohol/week: 0.0 oz  . Drug use: No  . Sexual activity: Not on file  Other Topics Concern  . Not on file  Social History Narrative  . Not on file     Physical Exam: BP 134/64 (BP Location: Left Arm, Patient Position: Sitting, Cuff Size: Normal)   Pulse 88   Ht 5' 1.75" (1.568 m) Comment: height measured without shoes  Wt 136 lb 2 oz (61.7 kg)   BMI 25.10 kg/m  Constitutional: generally well-appearing Psychiatric: alert and oriented x3 Abdomen: soft, nontender, nondistended, no obvious ascites, no peritoneal signs, normal bowel sounds No peripheral edema noted in lower extremities  Assessment and plan: 67 y.o. female with intermittent loose stools, much improved, unintentional weight loss which has stopped  Seems like she has responded favorably to cutting out fiber supplements from her regimen.  Her weight loss has stopped.  Her hemoglobin A1c last month was 10.7 and that is probably her biggest health concern  currently.  Out of control blood sugars may have contributed to some of her intermittent loose stools as well.  Recommended simply observing her clinically.  She is not in need of surveillance colonoscopy for another year and a half but if her diarrhea episodes return significantly then that could certainly be done sooner.  Please see the "Patient Instructions" section for addition details about the plan.  Owens Loffler, MD [facility] 03/02/2018, 2:10 PM

## 2018-03-09 NOTE — Addendum Note (Signed)
Addended by: Hulan Fray on: 03/09/2018 07:04 AM   Modules accepted: Orders

## 2018-04-04 ENCOUNTER — Telehealth: Payer: Self-pay

## 2018-04-04 NOTE — Telephone Encounter (Signed)
Patient called earlier to request Victoza sample.  She came into clinic today to pick it up: Drug name: Victoza (liraglutide) Lot: [id number] Exp: 03/2019 Instructions: Inject 1.2 mg (0.2 mg) into skin once daily  Evangeline Gula, [name] Candidate

## 2018-04-05 NOTE — Telephone Encounter (Signed)
Thank you, Arden!

## 2018-05-07 ENCOUNTER — Ambulatory Visit (INDEPENDENT_AMBULATORY_CARE_PROVIDER_SITE_OTHER): Payer: Medicare Other | Admitting: Internal Medicine

## 2018-05-07 ENCOUNTER — Encounter: Payer: Self-pay | Admitting: Internal Medicine

## 2018-05-07 VITALS — BP 121/73 | HR 89 | Temp 98.2°F | Wt 134.2 lb

## 2018-05-07 DIAGNOSIS — Z7984 Long term (current) use of oral hypoglycemic drugs: Secondary | ICD-10-CM | POA: Diagnosis not present

## 2018-05-07 DIAGNOSIS — Z79899 Other long term (current) drug therapy: Secondary | ICD-10-CM | POA: Diagnosis not present

## 2018-05-07 DIAGNOSIS — Z23 Encounter for immunization: Secondary | ICD-10-CM | POA: Diagnosis not present

## 2018-05-07 DIAGNOSIS — Z8249 Family history of ischemic heart disease and other diseases of the circulatory system: Secondary | ICD-10-CM | POA: Diagnosis not present

## 2018-05-07 DIAGNOSIS — I10 Essential (primary) hypertension: Secondary | ICD-10-CM | POA: Diagnosis not present

## 2018-05-07 DIAGNOSIS — Z794 Long term (current) use of insulin: Secondary | ICD-10-CM

## 2018-05-07 DIAGNOSIS — Z833 Family history of diabetes mellitus: Secondary | ICD-10-CM

## 2018-05-07 DIAGNOSIS — E119 Type 2 diabetes mellitus without complications: Secondary | ICD-10-CM | POA: Diagnosis not present

## 2018-05-07 DIAGNOSIS — E1142 Type 2 diabetes mellitus with diabetic polyneuropathy: Secondary | ICD-10-CM

## 2018-05-07 LAB — GLUCOSE, CAPILLARY: Glucose-Capillary: 139 mg/dL — ABNORMAL HIGH (ref 65–99)

## 2018-05-07 LAB — POCT URINALYSIS DIPSTICK

## 2018-05-07 LAB — POCT GLYCOSYLATED HEMOGLOBIN (HGB A1C): Hemoglobin A1C: 9.1 % — AB (ref 4.0–5.6)

## 2018-05-07 MED ORDER — DULOXETINE HCL 20 MG PO CPEP: 40.0000 mg | ORAL_CAPSULE | Freq: Every day | ORAL | 3 refills | Status: DC

## 2018-05-07 MED ORDER — LIRAGLUTIDE 18 MG/3ML ~~LOC~~ SOPN: 1.8000 mg | PEN_INJECTOR | Freq: Every day | SUBCUTANEOUS | 4 refills | Status: DC

## 2018-05-07 NOTE — Progress Notes (Signed)
CC: follow up T2DM, HTN  HPI:  [name] is a 67 y.o. female with PMH below.  She is here to follow upon her T2DM and HTN.    Please see A&P for status of the patient's chronic medical conditions  Past Medical History:  Diagnosis Date  . Allergy    SEASONAL  . Anemia   . Arthritis   . Cataract    PRESENT IN LEFT/ CATARACTS REMOVED FROM RIGHT  . Diabetes mellitus 1996  . DKA (diabetic ketoacidoses) ([place]) 01/02/2013  . Hyperlipidemia   . Hypertension   . Hypotension   . Influenza A (H1N1) 01/03/2013  . Neuromuscular disorder ([place])    NEUROPATHY  . SYNCOPE 05/10/2010   Qualifier: Diagnosis of  By: Marilynne Halsted, RN, BSN, Jacquelyn     Review of Systems:  ROS: Pulmonary: pt denies increased work of breathing, shortness of breath,  Cardiac: pt denies palpitations, chest pain,  Abdominal: pt denies abdominal pain, nausea, vomiting, or diarrhea  Physical Exam:  Vitals:   05/07/18 1545  BP: (!) 146/70  Pulse: 93  Temp: 98.2 F (36.8 C)  TempSrc: Oral  SpO2: 99%  Weight: 134 lb 3.2 oz (60.9 kg)   Physical Exam  Constitutional: No distress.  Cardiovascular: Normal rate, regular rhythm and normal heart sounds. Exam reveals no gallop and no friction rub.  No murmur heard. Pulmonary/Chest: Effort normal and breath sounds normal. No respiratory distress. She has no wheezes. She has no rales. She exhibits no tenderness.  Abdominal: Soft. Bowel sounds are normal. She exhibits no distension and no mass. There is no tenderness. There is no rebound and no guarding.  Neurological: She is alert.  Skin: She is not diaphoretic.    Social History   Socioeconomic History  . Marital status: Married    Spouse name: Not on file  . Number of children: 1  . Years of education: Not on file  . Highest education level: Not on file  Occupational History  . Occupation: retired  Scientific laboratory technician  . Financial resource strain: Not on file  . Food insecurity:    Worry: Not on file   Inability: Not on file  . Transportation needs:    Medical: Not on file    Non-medical: Not on file  Tobacco Use  . Smoking status: Never Smoker  . Smokeless tobacco: Never Used  Substance and Sexual Activity  . Alcohol use: No    Alcohol/week: 0.0 oz  . Drug use: No  . Sexual activity: Not on file  Lifestyle  . Physical activity:    Days per week: Not on file    Minutes per session: Not on file  . Stress: Not on file  Relationships  . Social connections:    Talks on phone: Not on file    Gets together: Not on file    Attends religious service: Not on file    Active member of club or organization: Not on file    Attends meetings of clubs or organizations: Not on file    Relationship status: Not on file  . Intimate partner violence:    Fear of current or ex partner: Not on file    Emotionally abused: Not on file    Physically abused: Not on file    Forced sexual activity: Not on file  Other Topics Concern  . Not on file  Social History Narrative  . Not on file    Family History  Problem Relation Age of Onset  .  Diabetes Mother   . Hypertension Mother   . Breast cancer Mother   . Aneurysm Father   . Diabetes Sister   . Hypertension Sister   . Kidney disease Sister        One Kidney transplant, one sister on HD  . Breast cancer Sister     Assessment & Plan:   See Encounters Tab for problem based charting.  Patient discussed with Dr. Dareen Piano

## 2018-05-07 NOTE — Patient Instructions (Addendum)
Ms. Girardot, good to see you again.  Continue to work to change your diet to help with your diabetes.  I have given you a log to bring back to your visit in July.  We have increased your dose of victoza to 1.8mg  daily.  Your blood pressure looked good today.  Keep taking the atorvastatin 40mg  we will recheck your lipid panel at your next visit.

## 2018-05-08 NOTE — Progress Notes (Signed)
[facility] Medicine Clinic Attending  Case discussed with Dr. [name]  at the time of the visit.  We reviewed the resident's history and exam and pertinent patient test results.  I agree with the assessment, diagnosis, and plan of care documented in the resident's note.  

## 2018-05-08 NOTE — Assessment & Plan Note (Signed)
BP Readings from Last 3 Encounters:  05/07/18 121/73  03/02/18 134/64  01/29/18 116/64   Repeat blood pressure well within goal.  Currently on lisinopril 40mg  daily.    -continue lisinopril 40mg  daily.

## 2018-05-08 NOTE — Assessment & Plan Note (Addendum)
Lab Results  Component Value Date   HGBA1C 9.1 (A) 05/07/2018   Pts A1C has come down almost 2 points despite dietary indiscretion, I expect she is taking her victoza more consistently which is corroborated by the patient.  As it is coming down the patient would like to try diet changes and continuing the course, she declines nutrition referral.  She was agreeable to increasing the dose of victoza to 1.8mg  daily.    -increased victoza to 1.8mg  daily, dietary changes with increased walking.   -refilled cymbalta for neuropathy, pt having continued symptoms, pt forgot she was supposed to be taking this.

## 2018-05-17 MED FILL — DULoxetine HCL 20 MG CPEP: 20 | 30 days supply | Qty: 60 | Fill #0

## 2018-05-31 ENCOUNTER — Ambulatory Visit: Payer: Medicare Other | Admitting: Podiatry

## 2018-06-01 ENCOUNTER — Ambulatory Visit: Payer: Medicare Other | Admitting: Podiatry

## 2018-06-29 ENCOUNTER — Ambulatory Visit (INDEPENDENT_AMBULATORY_CARE_PROVIDER_SITE_OTHER): Payer: Medicare Other | Admitting: Podiatry

## 2018-06-29 ENCOUNTER — Encounter: Payer: Self-pay | Admitting: Podiatry

## 2018-06-29 DIAGNOSIS — M79674 Pain in right toe(s): Secondary | ICD-10-CM

## 2018-06-29 DIAGNOSIS — M79675 Pain in left toe(s): Secondary | ICD-10-CM

## 2018-06-29 DIAGNOSIS — B351 Tinea unguium: Secondary | ICD-10-CM

## 2018-07-01 NOTE — Progress Notes (Signed)
Subjective: [name] is a 67 yo AAF who presents to clinic today with cc of painful, discolored, thick toenails which interfere with activities of daily living. Pain is aggravated when wearing enclosed shoe gear. Pain is getting progressively worse and relieved with periodic professional debridement.  [name] denies any new pedal concerns on today's visit.  Objective: There were no vitals filed for this visit. Vascular Examination: Capillary refill time <3 seconds x 10 digits Dorsalis pedis and posterior tibial pulses present b/l No digital hair x 10 digits Skin temperature warm to warm b/l  Dermatological Examination: Skin with normal turgor, texture and tone b/l Toenails 1-5 b/l discolored, thick, dystrophic with subungual debris and pain with palpation to nailbeds due to thickness of nails.  Musculoskeletal: Muscle strength 5/5 to all LE muscle groups  Neurological: Sensation intact with 10 gram monofilament. Vibratory sensation intact.  Assessment: Painful onychomycosis toenails 1-5 b/l   Plan: 1. Toenails 1-5 b/l were debrided in length and girth without iatrogenic bleeding. 2. Patient to continue soft, supportive shoe gear 3. Patient to report any pedal injuries to medical professional immediately. 4. Follow up 3 months.  5. Patient/POA to call should there be a concern in the interim.

## 2018-07-16 ENCOUNTER — Ambulatory Visit (HOSPITAL_COMMUNITY)
Admission: EM | Admit: 2018-07-16 | Discharge: 2018-07-16 | Disposition: A | Discharge status: Home / Self Care | Payer: Medicare Other | Attending: Family Medicine | Admitting: Family Medicine

## 2018-07-16 ENCOUNTER — Encounter (HOSPITAL_COMMUNITY): Payer: Self-pay | Admitting: Emergency Medicine

## 2018-07-16 ENCOUNTER — Encounter: Payer: Medicare Other | Admitting: Internal Medicine

## 2018-07-16 ENCOUNTER — Other Ambulatory Visit: Payer: Self-pay

## 2018-07-16 DIAGNOSIS — J069 Acute upper respiratory infection, unspecified: Secondary | ICD-10-CM | POA: Diagnosis not present

## 2018-07-16 DIAGNOSIS — B9789 Other viral agents as the cause of diseases classified elsewhere: Secondary | ICD-10-CM

## 2018-07-16 MED ORDER — CETIRIZINE HCL 5 MG PO TABS: 5.0000 mg | ORAL_TABLET | Freq: Every day | ORAL | 1 refills | Status: DC

## 2018-07-16 MED ORDER — BENZONATATE 100 MG PO CAPS: 100.0000 mg | ORAL_CAPSULE | Freq: Three times a day (TID) | ORAL | 0 refills | Status: DC

## 2018-07-16 MED ORDER — FLUTICASONE PROPIONATE 50 MCG/ACT NA SUSP: 2.0000 | Freq: Every day | NASAL | 2 refills | Status: DC

## 2018-07-16 MED FILL — FLUTICASONE PROP 50 MCG SPR: 50 | 30 days supply | Qty: 16 | Fill #0

## 2018-07-16 MED FILL — BENZONATATE 100 MG CAPS: 100 | 7 days supply | Qty: 21 | Fill #0

## 2018-07-16 NOTE — Discharge Instructions (Addendum)
It was nice meeting you!!  I believe you have a viral upper respiratory infection. It could take 7 to 10 days for the symptoms to decrease.  We will treat your  cough with Tessalon Perles. Zyrtec for congestion and Flonase for allergy symptoms.  Make sure you are staying hydrated and monitor for worsening symptoms. Warm tea with honey can also help with the cough.  Follow-up with your PCP if not getting any better. I believe that your leg pain is due to diabetic neuropathy. This may be something that you want to talk to your PCP about.

## 2018-07-16 NOTE — ED Provider Notes (Signed)
[place]    CSN: [id number] Arrival date & time: 07/16/18  1226     History   Chief Complaint Chief Complaint  Patient presents with  . URI  . Generalized Body Aches    BLE    HPI Amanda [name] is a 67 y.o. female.   Patient is a 67 year old female with past medical history of allergies, anemia, arthritis, diabetes,, DKA, hyperlipidemia, hypertension, neuropathy.  She presents with 2 weeks of upper respiratory symptoms to include cough, nasal congestion, ear pain, dizziness, and drainage in throat.  The symptoms have been waxing and waning. Nothing makes it better or worse She denies any fever, chills, body aches.  She has been using Tylenol but denies any over-the-counter cold medication use.  She has also been having BLE pain x 2 weeks. reports that the pain starts in her thighs and radiates into her feet. She describes it as a burning, sharp and aching at times. The pain in her thighs has improved since it started but still remains in her lowers legs and feet. She denies any redness, swelling. Denies any hx of DVT or recent travels.   ROS per HPI      Past Medical History:  Diagnosis Date  . Allergy    SEASONAL  . Anemia   . Arthritis   . Cataract    PRESENT IN LEFT/ CATARACTS REMOVED FROM RIGHT  . Diabetes mellitus 1996  . DKA (diabetic ketoacidoses) ([place]) 01/02/2013  . Hyperlipidemia   . Hypertension   . Hypotension   . Influenza A (H1N1) 01/03/2013  . Neuromuscular disorder ([place])    NEUROPATHY  . SYNCOPE 05/10/2010   Qualifier: Diagnosis of  By: Marilynne Halsted RN, BSN, Jacquelyn      Patient Active Problem List   Diagnosis Date Noted  . Nail fungus 10/23/2017  . Cervical radiculopathy due to degenerative joint disease of spine 05/03/2017  . Pain of both shoulder joints 11/14/2016  . Orthostatic hypotension 09/15/2016  . Osteopenia 05/04/2016  . MDD (major depressive disorder), recurrent episode, moderate ([place]) 02/08/2016  . Malaise 11/24/2015  .  Constipation 09/16/2014  . Diabetic retinopathy ([place]) 03/18/2014  . Healthcare maintenance 06/03/2013  . Diabetic neuropathy ([place]) 05/20/2013  . Diabetic nephropathy ([place]) 04/19/2013  . Seasonal allergies 03/12/2013  . Dyslipidemia 02/15/2013  . DM II (diabetes mellitus, type II), controlled ([place]) 02/14/2013  . Essential hypertension, benign 05/10/2010    Past Surgical History:  Procedure Laterality Date  . ABDOMINAL HYSTERECTOMY     10 years ago   . BREAST BIOPSY    . Cataracts    . ROTATOR CUFF REPAIR    . SHOULDER SURGERY     Rotator cuff tear     OB History    Gravida  1   Para  1   Term  1   Preterm      AB      Living  1     SAB      TAB      Ectopic      Multiple      Live Births               Home Medications    Prior to Admission medications   Medication Sig Start Date End Date Taking? Authorizing Provider  acetaminophen (TYLENOL) 325 MG tablet Take 2 tablets (650 mg total) by mouth every 6 (six) hours as needed (or Fever >/= 101). 01/05/13  Yes Dorian Heckle, MD  aspirin EC 81  MG tablet Take 81 mg by mouth daily.   Yes [provider]  atorvastatin (LIPITOR) 40 MG tablet Take 1 tablet (40 mg total) by mouth daily. 01/29/18 01/29/19 Yes [name], [name], MD  latanoprost (XALATAN) 0.005 % ophthalmic solution 1 drop at bedtime.   Yes [provider]  liraglutide (VICTOZA) 18 MG/3ML SOPN Inject 0.3 mLs (1.8 mg total) into the skin daily. 05/07/18 08/05/18 Yes [name], MD  lisinopril (PRINIVIL,ZESTRIL) 40 MG tablet Take 1 tablet (40 mg total) by mouth daily. 10/16/17 10/16/18 Yes [name], MD  metFORMIN (GLUCOPHAGE XR) 500 MG 24 hr tablet Take 2 tablets (1,000 mg total) by mouth 2 (two) times daily with a meal. 10/16/17 10/16/18 Yes [name], [name], MD  benzonatate (TESSALON) 100 MG capsule Take 1 capsule (100 mg total) by mouth every 8 (eight) hours. 07/16/18   Orvan July, NP  Capsaicin-Menthol-Methyl Sal  (CAPSAICIN-METHYL SAL-MENTHOL) 0.025-1-12 % CREA Apply over painful areas 02/08/16   Corky Sox, MD  cetirizine (ZYRTEC) 5 MG tablet Take 1 tablet (5 mg total) by mouth daily. 07/16/18   Loura Halt A, NP  Cholecalciferol (VITAMIN D3) 2000 units capsule Take by mouth.    [provider]  cyclobenzaprine (FLEXERIL) 5 MG tablet Take 1 tablet (5 mg total) by mouth 2 (two) times daily as needed for muscle spasms. 05/03/17   Dellia Nims, MD  dorzolamide-timolol (COSOPT) 22.3-6.8 MG/ML ophthalmic solution  06/16/16   [provider]  DULoxetine (CYMBALTA) 20 MG capsule Take 2 capsules (40 mg total) by mouth daily. 05/07/18   [name], MD  fluticasone (FLONASE) 50 MCG/ACT nasal spray Place 2 sprays into both nostrils daily. 07/16/18   Annelle Behrendt, Tressia Miners A, NP  Insulin Pen Needle 33G X 5 MM MISC 1 applicator by Does not apply route daily. Use to inject insulin once daily. Diagnosis code E11.42, Z79.4 10/17/17 08/13/18  [name], MD  Multiple Vitamins-Minerals (WOMENS 50+ MULTI VITAMIN/MIN) TABS Take 1 tablet by mouth daily. 03/18/14   Lucious Groves, DO    Family History Family History  Problem Relation Age of Onset  . Diabetes Mother   . Hypertension Mother   . Breast cancer Mother   . Aneurysm Father   . Diabetes Sister   . Hypertension Sister   . Kidney disease Sister        One Kidney transplant, one sister on HD  . Breast cancer Sister     Social History Social History   Tobacco Use  . Smoking status: Never Smoker  . Smokeless tobacco: Never Used  Substance Use Topics  . Alcohol use: No    Alcohol/week: 0.0 oz  . Drug use: No     Allergies   Gabapentin   Review of Systems Review of Systems   Physical Exam Triage Vital Signs ED Triage Vitals  Enc Vitals Group     BP 07/16/18 1244 (!) 159/88     Pulse Rate 07/16/18 1244 82     Resp --      Temp 07/16/18 1244 97.9 F (36.6 C)     Temp Source 07/16/18 1244 Oral     SpO2 07/16/18 1244 98 %       Weight --      Height --      Head Circumference --      Peak Flow --      Pain Score 07/16/18 1245 8     Pain Loc --  Pain Edu? --      Excl. in [place]? --    No data found.  Updated Vital Signs BP (!) 159/88 (BP Location: Left Arm)   Pulse 82   Temp 97.9 F (36.6 C) (Oral)   SpO2 98%   Visual Acuity Right Eye Distance:   Left Eye Distance:   Bilateral Distance:    Right Eye Near:   Left Eye Near:    Bilateral Near:     Physical Exam  Constitutional: She is oriented to person, place, and time. She appears well-developed and well-nourished.  HENT:  Head: Normocephalic and atraumatic.  Right Ear: External ear normal.  Left Ear: External ear normal.  Nose: Nose normal.  Mouth/Throat: No oropharyngeal exudate.  Elongated uvula with significant drainage.   Eyes: Pupils are equal, round, and reactive to light. Conjunctivae and EOM are normal.  Neck: Normal range of motion.  Cardiovascular: Normal rate, regular rhythm and normal heart sounds.  Pulmonary/Chest: Effort normal and breath sounds normal.  Lungs clear  Musculoskeletal:  Negative for erythema, swelling, ecchymosis, deformity, color change, temperature change to BLE.  Sensation and distal pulses intact to bilateral lower extremities.  Nontender to palpation of bilateral lower extremities  Neurological: She is alert and oriented to person, place, and time.  Skin: Skin is warm and dry. Capillary refill takes less than 2 seconds.  Psychiatric: She has a normal mood and affect.  Nursing note and vitals reviewed.    UC Treatments / Results  Labs (all labs ordered are listed, but only abnormal results are displayed) Labs Reviewed - No data to display  EKG None  Radiology No results found.  Procedures Procedures (including critical care time)  Medications Ordered in UC Medications - No data to display  Initial Impression / Assessment and Plan / UC Course  I have reviewed the triage vital signs and  the nursing notes.  Pertinent labs & imaging results that were available during my care of the patient were reviewed by me and considered in my medical decision making (see chart for details).     First problem Viral URI.  Lungs clear, afebrile.  No concern for bronchitis or pneumonia.  Will treat with Tessalon, Flonase, Zyrtec.  Second problem   Examination of bilateral lower extremities completely normal.  No concern for PVD, PAD, DVT. I believe the pain in her lower extremities is neuropathic pain due to her diabetes.   She was instructed to speak with her doctor about possibly starting something for neuropathic pain.  Final Clinical Impressions(s) / UC Diagnoses   Final diagnoses:  Viral URI with cough     Discharge Instructions     It was nice meeting you!!  I believe you have a viral upper respiratory infection. It could take 7 to 10 days for the symptoms to decrease.  We will treat your  cough with Tessalon Perles. Zyrtec for congestion and Flonase for allergy symptoms.  Make sure you are staying hydrated and monitor for worsening symptoms. Warm tea with honey can also help with the cough.  Follow-up with your PCP if not getting any better. I believe that your leg pain is due to diabetic neuropathy. This may be something that you want to talk to your PCP about.     ED Prescriptions    Medication Sig Dispense Auth. Provider   benzonatate (TESSALON) 100 MG capsule Take 1 capsule (100 mg total) by mouth every 8 (eight) hours. 21 capsule Orvan July, NP  fluticasone (FLONASE) 50 MCG/ACT nasal spray Place 2 sprays into both nostrils daily. 16 g Payam Gribble A, NP   cetirizine (ZYRTEC) 5 MG tablet Take 1 tablet (5 mg total) by mouth daily. 30 tablet Loura Halt A, NP     Controlled Substance Prescriptions [place] Controlled Substance Registry consulted? Not Applicable   Orvan July, NP 07/16/18 1738

## 2018-07-16 NOTE — ED Triage Notes (Signed)
Pt reports nasal congestion and drainage, cough, sore throat and bilateral ear discomfort along with BLE pain for about two weeks.

## 2018-07-20 MED FILL — LATANOPROST 0.005% EYE DRP: 0.005 | 25 days supply | Qty: 3 | Fill #0

## 2018-07-23 ENCOUNTER — Ambulatory Visit: Payer: Medicare Other | Admitting: Pharmacist

## 2018-07-23 ENCOUNTER — Encounter: Payer: Self-pay | Admitting: Internal Medicine

## 2018-07-23 ENCOUNTER — Ambulatory Visit (INDEPENDENT_AMBULATORY_CARE_PROVIDER_SITE_OTHER): Payer: Medicare Other | Admitting: Internal Medicine

## 2018-07-23 ENCOUNTER — Ambulatory Visit (INDEPENDENT_AMBULATORY_CARE_PROVIDER_SITE_OTHER): Payer: Medicare Other | Admitting: Dietician

## 2018-07-23 ENCOUNTER — Encounter: Payer: Self-pay | Admitting: Dietician

## 2018-07-23 VITALS — BP 158/78 | HR 92 | Temp 99.5°F | Wt 127.5 lb

## 2018-07-23 DIAGNOSIS — Z794 Long term (current) use of insulin: Secondary | ICD-10-CM

## 2018-07-23 DIAGNOSIS — I959 Hypotension, unspecified: Secondary | ICD-10-CM

## 2018-07-23 DIAGNOSIS — E1140 Type 2 diabetes mellitus with diabetic neuropathy, unspecified: Secondary | ICD-10-CM | POA: Diagnosis not present

## 2018-07-23 DIAGNOSIS — F331 Major depressive disorder, recurrent, moderate: Secondary | ICD-10-CM

## 2018-07-23 DIAGNOSIS — E1142 Type 2 diabetes mellitus with diabetic polyneuropathy: Secondary | ICD-10-CM

## 2018-07-23 DIAGNOSIS — E860 Dehydration: Secondary | ICD-10-CM | POA: Diagnosis not present

## 2018-07-23 DIAGNOSIS — E118 Type 2 diabetes mellitus with unspecified complications: Secondary | ICD-10-CM

## 2018-07-23 DIAGNOSIS — I10 Essential (primary) hypertension: Secondary | ICD-10-CM | POA: Diagnosis not present

## 2018-07-23 DIAGNOSIS — E119 Type 2 diabetes mellitus without complications: Secondary | ICD-10-CM | POA: Diagnosis not present

## 2018-07-23 DIAGNOSIS — Z79899 Other long term (current) drug therapy: Secondary | ICD-10-CM

## 2018-07-23 DIAGNOSIS — Z8249 Family history of ischemic heart disease and other diseases of the circulatory system: Secondary | ICD-10-CM

## 2018-07-23 DIAGNOSIS — Z833 Family history of diabetes mellitus: Secondary | ICD-10-CM

## 2018-07-23 LAB — POCT GLYCOSYLATED HEMOGLOBIN (HGB A1C): HbA1c POC (<> result, manual entry): >14 % — AB (ref 4.0–5.6)

## 2018-07-23 LAB — BASIC METABOLIC PANEL
Anion gap: 8 (ref 5–15)
BUN: 27 mg/dL — ABNORMAL HIGH (ref 8–23)
CO2: 25 mmol/L (ref 22–32)
Calcium: 9.1 mg/dL (ref 8.9–10.3)
Chloride: 104 mmol/L (ref 98–111)
Creatinine, Ser: 1.5 mg/dL — ABNORMAL HIGH (ref 0.44–1.00)
GFR calc Af Amer: 41 mL/min — ABNORMAL LOW (ref >60)
GFR calc non Af Amer: 35 mL/min — ABNORMAL LOW (ref >60)
Glucose, Bld: 279 mg/dL — ABNORMAL HIGH (ref 70–99)
Potassium: 4.8 mmol/L (ref 3.5–5.1)
Sodium: 137 mmol/L (ref 135–145)

## 2018-07-23 LAB — GLUCOSE, CAPILLARY: Glucose-Capillary: 250 mg/dL — ABNORMAL HIGH (ref 70–99)

## 2018-07-23 MED ORDER — SODIUM CHLORIDE 0.9 % IV SOLN
INTRAVENOUS | Status: AC
  Administered 2018-07-23: 16:00:00 via INTRAVENOUS

## 2018-07-23 MED ORDER — INSULIN PEN NEEDLE 33G X 5 MM MISC: 1.0000 | Freq: Every day | 3 refills | Status: DC

## 2018-07-23 MED ORDER — DULOXETINE HCL 20 MG PO CPEP: 20.0000 mg | ORAL_CAPSULE | Freq: Every day | ORAL | 3 refills | Status: DC

## 2018-07-23 MED ORDER — INSULIN ASPART 100 UNIT/ML FLEXPEN: 3.0000 [IU] | PEN_INJECTOR | Freq: Three times a day (TID) | SUBCUTANEOUS | 0 refills | Status: DC

## 2018-07-23 MED ORDER — METFORMIN HCL ER 500 MG PO TB24: 1000.0000 mg | ORAL_TABLET | Freq: Two times a day (BID) | ORAL | 3 refills | Status: DC

## 2018-07-23 MED ORDER — LIRAGLUTIDE 18 MG/3ML ~~LOC~~ SOPN: 1.8000 mg | PEN_INJECTOR | Freq: Every day | SUBCUTANEOUS | 4 refills | Status: DC

## 2018-07-23 MED ORDER — DULAGLUTIDE 1.5 MG/0.5ML ~~LOC~~ SOAJ
1.5000 mg | SUBCUTANEOUS | 2 refills | Status: DC
Start: 2018-07-23 — End: 2018-07-23

## 2018-07-23 MED ORDER — INSULIN DETEMIR 100 UNIT/ML FLEXPEN: 10.0000 [IU] | Freq: Every day | SUBCUTANEOUS | 0 refills | Status: DC

## 2018-07-23 MED FILL — METFORMIN HCL ER 500 MG TAB: 500 | 90 days supply | Qty: 360 | Fill #0

## 2018-07-23 MED FILL — DULoxetine HCL 20 MG CPEP: 20 | 30 days supply | Qty: 30 | Fill #0

## 2018-07-23 NOTE — Assessment & Plan Note (Addendum)
Lab Results  Component Value Date   HGBA1C >14.0 (A) 07/23/2018   She reports that her blood sugars have been running very high, usually checks her blood sugar randomly admits not often.  Stressed, not keeping a particular schedule.  Not taking victoza consistently again reports missing doses about 3 days a week.  She reports that she is taking metformin daily.  Eating sweets and white breads, [place] rice, white rice and some pasta.  Endorses polyuria today.  Dry on exam and labs.  Feeling poorly, no vomiting or nausea.  Checked stat bmp and this showed a mild AKI from osmotic diuresis.   -I attempted to switch pt to dulaglitide once weekly from [place] but copay was too high for patient, patient is getting victoza from Dr. Maudie Mercury -Her A1C is extremely high today I will start her on basal bolus today 10 units of levemir and 3 unist novolog TID with meals. -continue victoza 1.8mg  daily, metformin 1000mg  BID -gave 1L NS -placed CGM today and pt will follow up in one week to adjust insulin therapy

## 2018-07-23 NOTE — Assessment & Plan Note (Signed)
BP Readings from Last 3 Encounters:  07/23/18 (!) 158/78  07/16/18 (!) 159/88  05/07/18 121/73   Patient's original blood pressure was 87/62 due to osmotic diuresis from uncontrolled T2DM.  Pt given 1L NS with great improvement.  -hold lisinopril 40mg   -check blood pressure at one week cgm follow up and can restart if pt maintaining volume.

## 2018-07-23 NOTE — Assessment & Plan Note (Signed)
Pt reports more stress at home.  She says she has put others above herself and her family issues have continued to weigh heavily on her.  She denies SI or HI.  She admits that she only took cymbalta about twice says it made her feel lightheaded, but will try again at the lower dose, was never taking the low dose consistently before I increased her dosage last visit.  Patient does not want to start any medications for her depression.  She tolerates the cymbalta because it has a dual role to treat her neuropathy.    -will start back pts cymbalta 20mg  daily

## 2018-07-23 NOTE — Progress Notes (Signed)
Documentation:  Discussed patient care with patient and Dr. Shan Levans. Amanda [name] agreed to have a Continuous glucose monitor sensor for the professional Continuous glucose monitor placed today.   Freestyle Libre Pro CGM sensor placed and started. Patient was educated about wearing sensor, keeping food, activity and medication log and when to call office. She was educated how to care for the sesnor and  Not to have a CT or an MRI with it on. She verbalized understanding.  She was also educated about actions of victoza and meal planning for diabetes.  Follow up was arranged with the patient.  [name], RD 07/23/2018 3:12 PM.

## 2018-07-23 NOTE — Progress Notes (Signed)
CC: follow up on her T2DM, HTN, Depression  HPI:  Amanda [name] is a 67 y.o. female with PMH below.  She is here for follow up on her T2DM, HTN, depression  Please see A&P for status of the patient's chronic medical conditions  Past Medical History:  Diagnosis Date  . Allergy    SEASONAL  . Anemia   . Arthritis   . Cataract    PRESENT IN LEFT/ CATARACTS REMOVED FROM RIGHT  . Diabetes mellitus 1996  . DKA (diabetic ketoacidoses) ([place]) 01/02/2013  . Hyperlipidemia   . Hypertension   . Hypotension   . Influenza A (H1N1) 01/03/2013  . Neuromuscular disorder ([place])    NEUROPATHY  . SYNCOPE 05/10/2010   Qualifier: Diagnosis of  By: Marilynne Halsted, RN, BSN, Jacquelyn     Review of Systems:  ROS: Pulmonary: pt denies increased work of breathing, shortness of breath,  Cardiac: pt denies palpitations, chest pain,  Abdominal: pt denies abdominal pain, nausea, vomiting, or diarrhea  Physical Exam:  Vitals:   07/23/18 1346 07/23/18 1636  BP: (!) 87/62 (!) 158/78  Pulse: (!) 102 92  Temp: 99.5 F (37.5 C)   TempSrc: Oral   SpO2: 100%   Weight: 127 lb 8 oz (57.8 kg)    Physical Exam  Constitutional: No distress.  Cardiovascular: Regular rhythm and normal heart sounds. Tachycardia present. Exam reveals no gallop and no friction rub.  No murmur heard. Pulmonary/Chest: Effort normal and breath sounds normal. No respiratory distress. She has no wheezes. She has no rales. She exhibits no tenderness.  Neurological: She is alert.  Skin: She is not diaphoretic.    Social History   Socioeconomic History  . Marital status: Married    Spouse name: Not on file  . Number of children: 1  . Years of education: Not on file  . Highest education level: Not on file  Occupational History  . Occupation: retired  Scientific laboratory technician  . Financial resource strain: Not on file  . Food insecurity:    Worry: Not on file    Inability: Not on file  . Transportation needs:    Medical: Not on file   Non-medical: Not on file  Tobacco Use  . Smoking status: Never Smoker  . Smokeless tobacco: Never Used  Substance and Sexual Activity  . Alcohol use: No    Alcohol/week: 0.0 oz  . Drug use: No  . Sexual activity: Not on file  Lifestyle  . Physical activity:    Days per week: Not on file    Minutes per session: Not on file  . Stress: Not on file  Relationships  . Social connections:    Talks on phone: Not on file    Gets together: Not on file    Attends religious service: Not on file    Active member of club or organization: Not on file    Attends meetings of clubs or organizations: Not on file    Relationship status: Not on file  . Intimate partner violence:    Fear of current or ex partner: Not on file    Emotionally abused: Not on file    Physically abused: Not on file    Forced sexual activity: Not on file  Other Topics Concern  . Not on file  Social History Narrative  . Not on file    Family History  Problem Relation Age of Onset  . Diabetes Mother   . Hypertension Mother   . Breast cancer  Mother   . Aneurysm Father   . Diabetes Sister   . Hypertension Sister   . Kidney disease Sister        One Kidney transplant, one sister on HD  . Breast cancer Sister     Assessment & Plan:   See Encounters Tab for problem based charting.  Patient discussed with Dr. Dareen Piano

## 2018-07-23 NOTE — Patient Instructions (Signed)
Amanda [name], you were hypotensive today due to dehydration from your high blood sugars.  We gave you some IV fluids.  We would like you to please return after about one week on your insulin to recheck your blood sugar and look at your continuous monitor.  During this time please do not take your lisinopril your blood pressure medicine.  The doctor's will reassess your bp and renal function at your next visit.

## 2018-07-24 ENCOUNTER — Encounter: Payer: Self-pay | Admitting: Dietician

## 2018-07-24 ENCOUNTER — Ambulatory Visit: Payer: Medicare Other | Admitting: Pharmacist

## 2018-07-24 ENCOUNTER — Telehealth: Payer: Self-pay | Admitting: Internal Medicine

## 2018-07-24 ENCOUNTER — Telehealth: Payer: Self-pay | Admitting: *Deleted

## 2018-07-24 ENCOUNTER — Other Ambulatory Visit: Payer: Medicare Other

## 2018-07-24 DIAGNOSIS — E1142 Type 2 diabetes mellitus with diabetic polyneuropathy: Secondary | ICD-10-CM | POA: Diagnosis not present

## 2018-07-24 DIAGNOSIS — I10 Essential (primary) hypertension: Secondary | ICD-10-CM

## 2018-07-24 DIAGNOSIS — Z794 Long term (current) use of insulin: Secondary | ICD-10-CM | POA: Diagnosis not present

## 2018-07-24 DIAGNOSIS — E118 Type 2 diabetes mellitus with unspecified complications: Secondary | ICD-10-CM | POA: Diagnosis not present

## 2018-07-24 MED ORDER — METFORMIN HCL ER 500 MG PO TB24: 1000.0000 mg | ORAL_TABLET | Freq: Two times a day (BID) | ORAL | 3 refills | Status: DC

## 2018-07-24 MED ORDER — ATORVASTATIN CALCIUM 40 MG PO TABS: 40.0000 mg | ORAL_TABLET | Freq: Every day | ORAL | 3 refills | Status: DC

## 2018-07-24 MED ORDER — LISINOPRIL 40 MG PO TABS: 40.0000 mg | ORAL_TABLET | Freq: Every day | ORAL | 3 refills | Status: DC

## 2018-07-24 NOTE — Telephone Encounter (Signed)
Dr Shan Levans, insurance prefers humalog will not cover novolog, please advise

## 2018-07-24 NOTE — Telephone Encounter (Signed)
   Reason for call:   I received a call from Ms. [name] at 8 AM indicating insulin was too expensive.   Pertinent Data:   Patient was prescribed insulin detemir and aspart yesterday by Dr. Shan Levans, however noted a $190 copay for 3 months for each which is too expensive for her. She was wondering if there was another option.  She has previously been on insulin through pen and vials.   Assessment / Plan / Recommendations:   Since this is a non-urgent situation and Dr. Shan Levans saw patient yesterday in clinic and prescribed these meds, I will forward to him. It seems patient is agreeable to insulin vials if these are more affordable. Will also forward to Dr. Maudie Mercury. Told patient to expect a call from someone in the clinic later today.   Alphonzo Grieve, MD   07/24/2018, 8:14 AM

## 2018-07-24 NOTE — Telephone Encounter (Signed)
No problem, Dr. Jari Favre! I provided samples while we look into options---a bit challenging with Medicare but will try.  Thank you!

## 2018-07-24 NOTE — Progress Notes (Signed)
S: Amanda [name] is a 67 y.o. female reports to clinical pharmacist appointment for medication help  Allergies  Allergen Reactions  . Gabapentin     Pt states medication made her feel "out of it".     Medication Sig  acetaminophen (TYLENOL) 325 MG tablet Take 2 tablets (650 mg total) by mouth every 6 (six) hours as needed (or Fever >/= 101).  aspirin EC 81 MG tablet Take 81 mg by mouth daily.  atorvastatin (LIPITOR) 40 MG tablet Take 1 tablet (40 mg total) by mouth daily.  benzonatate (TESSALON) 100 MG capsule Take 1 capsule (100 mg total) by mouth every 8 (eight) hours.  Capsaicin-Menthol-Methyl Sal (CAPSAICIN-METHYL SAL-MENTHOL) 0.025-1-12 % CREA Apply over painful areas  cetirizine (ZYRTEC) 5 MG tablet Take 1 tablet (5 mg total) by mouth daily.  Cholecalciferol (VITAMIN D3) 2000 units capsule Take by mouth.  cyclobenzaprine (FLEXERIL) 5 MG tablet Take 1 tablet (5 mg total) by mouth 2 (two) times daily as needed for muscle spasms.  dorzolamide-timolol (COSOPT) 22.3-6.8 MG/ML ophthalmic solution   DULoxetine (CYMBALTA) 20 MG capsule Take 1 capsule (20 mg total) by mouth daily.  fluticasone (FLONASE) 50 MCG/ACT nasal spray Place 2 sprays into both nostrils daily.  insulin aspart (NOVOLOG) 100 UNIT/ML FlexPen Inject 3 Units into the skin 3 (three) times daily with meals.  insulin detemir (LEVEMIR) 100 unit/ml SOLN Inject 0.1 mLs (10 Units total) into the skin at bedtime.  Insulin Pen Needle 33G X 5 MM MISC 1 applicator by Does not apply route daily. The patient is insulin requiring, ICD 10 code E11.65. The patient tests 5 times per day.  latanoprost (XALATAN) 0.005 % ophthalmic solution 1 drop at bedtime.  liraglutide (VICTOZA) 18 MG/3ML SOPN Inject 0.3 mLs (1.8 mg total) into the skin daily.  lisinopril (PRINIVIL,ZESTRIL) 40 MG tablet Take 1 tablet (40 mg total) by mouth daily.  metFORMIN (GLUCOPHAGE XR) 500 MG 24 hr tablet Take 2 tablets (1,000 mg total) by mouth 2 (two) times daily with  a meal.  Multiple Vitamins-Minerals (WOMENS 50+ MULTI VITAMIN/MIN) TABS Take 1 tablet by mouth daily.   Past Medical History:  Diagnosis Date  . Allergy    SEASONAL  . Anemia   . Arthritis   . Cataract    PRESENT IN LEFT/ CATARACTS REMOVED FROM RIGHT  . Diabetes mellitus 1996  . DKA (diabetic ketoacidoses) ([place]) 01/02/2013  . Hyperlipidemia   . Hypertension   . Hypotension   . Influenza A (H1N1) 01/03/2013  . Neuromuscular disorder ([place])    NEUROPATHY  . SYNCOPE 05/10/2010   Qualifier: Diagnosis of  By: Marilynne Halsted RN, BSN, Jacquelyn     Social History   Socioeconomic History  . Marital status: Married    Spouse name: Not on file  . Number of children: 1  . Years of education: Not on file  . Highest education level: Not on file  Occupational History  . Occupation: retired  Scientific laboratory technician  . Financial resource strain: Not on file  . Food insecurity:    Worry: Not on file    Inability: Not on file  . Transportation needs:    Medical: Not on file    Non-medical: Not on file  Tobacco Use  . Smoking status: Never Smoker  . Smokeless tobacco: Never Used  Substance and Sexual Activity  . Alcohol use: No    Alcohol/week: 0.0 oz  . Drug use: No  . Sexual activity: Not on file  Lifestyle  . Physical activity:  Days per week: Not on file    Minutes per session: Not on file  . Stress: Not on file  Relationships  . Social connections:    Talks on phone: Not on file    Gets together: Not on file    Attends religious service: Not on file    Active member of club or organization: Not on file    Attends meetings of clubs or organizations: Not on file    Relationship status: Not on file  Other Topics Concern  . Not on file  Social History Narrative  . Not on file   Family History  Problem Relation Age of Onset  . Diabetes Mother   . Hypertension Mother   . Breast cancer Mother   . Aneurysm Father   . Diabetes Sister   . Hypertension Sister   . Kidney disease Sister         One Kidney transplant, one sister on HD  . Breast cancer Sister    O:    Component Value Date/Time   CHOL 158 01/29/2018 1648   HDL 68 01/29/2018 1648   TRIG 51 01/29/2018 1648   AST 11 12/25/2017 1426   ALT 7 12/25/2017 1426   NA 137 07/23/2018 1450   NA 141 10/23/2017 1505   K 4.8 07/23/2018 1450   CL 104 07/23/2018 1450   CO2 25 07/23/2018 1450   GLUCOSE 279 (H) 07/23/2018 1450   HGBA1C >14.0 (A) 07/23/2018 1411   BUN 27 (H) 07/23/2018 1450   BUN 32 (H) 10/23/2017 1505   CREATININE 1.50 (H) 07/23/2018 1450   CREATININE 0.97 06/16/2015 1553   CALCIUM 9.1 07/23/2018 1450   GFRNONAA 35 (L) 07/23/2018 1450   GFRNONAA 63 06/16/2015 1553   GFRAA 41 (L) 07/23/2018 1450   GFRAA 72 06/16/2015 1553   WBC 5.4 12/25/2017 1426   HGB 10.3 (L) 12/25/2017 1426   HGB WILL FOLLOW 06/05/2017 1401   HCT 30.3 (L) 12/25/2017 1426   HCT WILL FOLLOW 06/05/2017 1401   PLT 324.0 12/25/2017 1426   PLT WILL FOLLOW 06/05/2017 1401   TSH 1.520 11/24/2015 1543   Ht Readings from Last 2 Encounters:  03/02/18 5' 1.75" (1.568 m)  01/29/18 5\' 1"  (1.549 m)   Wt Readings from Last 2 Encounters:  07/23/18 127 lb 8 oz (57.8 kg)  05/07/18 134 lb 3.2 oz (60.9 kg)   There is no height or weight on file to calculate BMI. BP Readings from Last 3 Encounters:  07/23/18 (!) 158/78  07/16/18 (!) 159/88  05/07/18 121/73   A/P: Patient was started on insulin yesterday in response to symptomatic A1C > 14. Provided insulin samples today, will follow up with patient to help with ongoing medication access. Insulin was reviewed with the patient, including name, instructions, indication, goals of therapy, potential side effects, importance of adherence, and safe use.  Patient verbalized understanding by repeating back information and was advised to contact clinic if medication-related questions arise.  Follow up appointment in 1 week per PCP.

## 2018-07-24 NOTE — Progress Notes (Addendum)
Patient was seen today in a co-visit between the physician and pharmacist. Patient reports not feeling well, main concern with medications today is medication access. Victoza samples provided, will continue to assist in care of patient as needed.  See documentation under Dr. Maudie Flakes visit for details.

## 2018-07-24 NOTE — Progress Notes (Signed)
[facility] Medicine Clinic Attending  Case discussed with Dr. [name]  at the time of the visit.  We reviewed the resident's history and exam and pertinent patient test results.  I agree with the assessment, diagnosis, and plan of care documented in the resident's note.  

## 2018-07-25 ENCOUNTER — Other Ambulatory Visit: Payer: Medicare Other

## 2018-07-25 LAB — MICROALBUMIN / CREATININE URINE RATIO
Creatinine, Urine: 91.2 mg/dL
Microalb/Creat Ratio: 238.5 mg/g creat — ABNORMAL HIGH (ref 0.0–30.0)
Microalbumin, Urine: 217.5 ug/mL

## 2018-07-25 NOTE — Telephone Encounter (Signed)
Spoke w/ dr Shan Levans he is working w/ dr Maudie Mercury for alternative

## 2018-07-30 ENCOUNTER — Ambulatory Visit: Payer: Medicare Other | Admitting: Pharmacist

## 2018-07-30 ENCOUNTER — Encounter (INDEPENDENT_AMBULATORY_CARE_PROVIDER_SITE_OTHER): Payer: Self-pay

## 2018-07-30 DIAGNOSIS — E118 Type 2 diabetes mellitus with unspecified complications: Secondary | ICD-10-CM

## 2018-07-30 NOTE — Progress Notes (Signed)
S: Amanda [name] is a 67 y.o. female who reports to clinical pharmacist appointment for diabetes management. Patient did bring medication bottles.  Allergies  Allergen Reactions  . Gabapentin     Pt states medication made her feel "out of it".     Prior to Admission medications   Medication Sig Start Date End Date Taking? Authorizing Provider  acetaminophen (TYLENOL) 325 MG tablet Take 2 tablets (650 mg total) by mouth every 6 (six) hours as needed (or Fever >/= 101). 01/05/13   Dorian Heckle, MD  aspirin EC 81 MG tablet Take 81 mg by mouth daily.    [provider]  atorvastatin (LIPITOR) 40 MG tablet Take 1 tablet (40 mg total) by mouth daily. 07/24/18 07/24/19  [name], MD  benzonatate (TESSALON) 100 MG capsule Take 1 capsule (100 mg total) by mouth every 8 (eight) hours. 07/16/18   Orvan July, NP  Capsaicin-Menthol-Methyl Sal (CAPSAICIN-METHYL SAL-MENTHOL) 0.025-1-12 % CREA Apply over painful areas 02/08/16   Corky Sox, MD  cetirizine (ZYRTEC) 5 MG tablet Take 1 tablet (5 mg total) by mouth daily. 07/16/18   Loura Halt A, NP  Cholecalciferol (VITAMIN D3) 2000 units capsule Take by mouth.    [provider]  cyclobenzaprine (FLEXERIL) 5 MG tablet Take 1 tablet (5 mg total) by mouth 2 (two) times daily as needed for muscle spasms. 05/03/17   Dellia Nims, MD  dorzolamide-timolol (COSOPT) 22.3-6.8 MG/ML ophthalmic solution  06/16/16   [provider]  DULoxetine (CYMBALTA) 20 MG capsule Take 1 capsule (20 mg total) by mouth daily. 07/23/18   [name], MD  fluticasone (FLONASE) 50 MCG/ACT nasal spray Place 2 sprays into both nostrils daily. 07/16/18   Loura Halt A, NP  insulin aspart (NOVOLOG) 100 UNIT/ML FlexPen Inject 3 Units into the skin 3 (three) times daily with meals. 07/23/18   [name], MD  insulin detemir (LEVEMIR) 100 unit/ml SOLN Inject 0.1 mLs (10 Units total) into the skin at bedtime. 07/23/18   [name], MD  Insulin  Pen Needle 33G X 5 MM MISC 1 applicator by Does not apply route daily. The patient is insulin requiring, ICD 10 code E11.65. The patient tests 5 times per day. 07/23/18 05/19/19  [name], MD  latanoprost (XALATAN) 0.005 % ophthalmic solution 1 drop at bedtime.    [provider]  liraglutide (VICTOZA) 18 MG/3ML SOPN Inject 0.3 mLs (1.8 mg total) into the skin daily. 07/23/18 10/21/18  [name], MD  lisinopril (PRINIVIL,ZESTRIL) 40 MG tablet Take 1 tablet (40 mg total) by mouth daily. 07/24/18 07/24/19  [name], MD  metFORMIN (GLUCOPHAGE XR) 500 MG 24 hr tablet Take 2 tablets (1,000 mg total) by mouth 2 (two) times daily with a meal. 07/24/18 07/24/19  [name], MD  Multiple Vitamins-Minerals (WOMENS 50+ MULTI VITAMIN/MIN) TABS Take 1 tablet by mouth daily. 03/18/14   Lucious Groves, DO   Past Medical History:  Diagnosis Date  . Allergy    SEASONAL  . Anemia   . Arthritis   . Cataract    PRESENT IN LEFT/ CATARACTS REMOVED FROM RIGHT  . Diabetes mellitus 1996  . DKA (diabetic ketoacidoses) ([place]) 01/02/2013  . Hyperlipidemia   . Hypertension   . Hypotension   . Influenza A (H1N1) 01/03/2013  . Neuromuscular disorder ([place])    NEUROPATHY  . SYNCOPE 05/10/2010   Qualifier: Diagnosis of  By: Marilynne Halsted RN, BSN, Jacquelyn  Social History   Socioeconomic History  . Marital status: Married    Spouse name: Not on file  . Number of children: 1  . Years of education: Not on file  . Highest education level: Not on file  Occupational History  . Occupation: retired  Scientific laboratory technician  . Financial resource strain: Not on file  . Food insecurity:    Worry: Not on file    Inability: Not on file  . Transportation needs:    Medical: Not on file    Non-medical: Not on file  Tobacco Use  . Smoking status: Never Smoker  . Smokeless tobacco: Never Used  Substance and Sexual Activity  . Alcohol use: No    Alcohol/week: 0.0 standard drinks  . Drug use: No  . Sexual  activity: Not on file  Lifestyle  . Physical activity:    Days per week: Not on file    Minutes per session: Not on file  . Stress: Not on file  Relationships  . Social connections:    Talks on phone: Not on file    Gets together: Not on file    Attends religious service: Not on file    Active member of club or organization: Not on file    Attends meetings of clubs or organizations: Not on file    Relationship status: Not on file  Other Topics Concern  . Not on file  Social History Narrative  . Not on file   Family History  Problem Relation Age of Onset  . Diabetes Mother   . Hypertension Mother   . Breast cancer Mother   . Aneurysm Father   . Diabetes Sister   . Hypertension Sister   . Kidney disease Sister        One Kidney transplant, one sister on HD  . Breast cancer Sister     O:  Ht Readings from Last 2 Encounters:  03/02/18 5' 1.75" (1.568 m)  01/29/18 5\' 1"  (1.549 m)   Wt Readings from Last 2 Encounters:  07/23/18 127 lb 8 oz (57.8 kg)  05/07/18 134 lb 3.2 oz (60.9 kg)   There is no height or weight on file to calculate BMI. BP Readings from Last 3 Encounters:  07/23/18 (!) 158/78  07/16/18 (!) 159/88  05/07/18 121/73     A/P: Patient was started on Tresiba 10 units at night and Novolog 5 units TID on 8/6. Patient has personal CGM but forgot to bring meter with her to appointment. However, patient did write down a few blood glucose readings over the last week that averaged around lower 100's (110-130). Patient does not report any hypoglycemic events and states she does not need insulin samples today. Patient will continue current regimen and will follow up in 1 week with meter.   The patient verbalized understanding of information provided by repeating back concepts discussed.

## 2018-07-30 NOTE — Progress Notes (Signed)
Patient was seen in clinic with Justice Deeds, [name] candidate. I agree with the assessment and plan of care documented.

## 2018-08-01 ENCOUNTER — Encounter: Payer: Self-pay | Admitting: *Deleted

## 2018-08-02 ENCOUNTER — Other Ambulatory Visit: Payer: Self-pay | Admitting: Internal Medicine

## 2018-08-02 MED ORDER — INSULIN LISPRO 100 UNIT/ML (KWIKPEN): 3.0000 [IU] | PEN_INJECTOR | Freq: Three times a day (TID) | SUBCUTANEOUS | 2 refills | Status: DC

## 2018-08-02 NOTE — Telephone Encounter (Signed)
Received fax from [place] stating, "Insurance prefers Humalog and will not cover novolog. Please send alternative." L. Silvano Rusk, RN, BSN

## 2018-08-02 NOTE — Progress Notes (Signed)
Insurance prefers Media planner wrote for [employer].

## 2018-08-08 ENCOUNTER — Ambulatory Visit: Payer: Medicare Other | Admitting: Pharmacist

## 2018-08-08 ENCOUNTER — Other Ambulatory Visit: Payer: Self-pay

## 2018-08-08 ENCOUNTER — Ambulatory Visit (INDEPENDENT_AMBULATORY_CARE_PROVIDER_SITE_OTHER): Payer: Medicare Other | Admitting: Internal Medicine

## 2018-08-08 VITALS — BP 135/67 | HR 88 | Temp 98.7°F | Ht 61.5 in | Wt 130.5 lb

## 2018-08-08 DIAGNOSIS — R51 Headache: Secondary | ICD-10-CM | POA: Diagnosis not present

## 2018-08-08 DIAGNOSIS — R202 Paresthesia of skin: Secondary | ICD-10-CM

## 2018-08-08 DIAGNOSIS — Z79899 Other long term (current) drug therapy: Secondary | ICD-10-CM

## 2018-08-08 DIAGNOSIS — E118 Type 2 diabetes mellitus with unspecified complications: Secondary | ICD-10-CM | POA: Diagnosis not present

## 2018-08-08 DIAGNOSIS — I10 Essential (primary) hypertension: Secondary | ICD-10-CM

## 2018-08-08 DIAGNOSIS — Z794 Long term (current) use of insulin: Secondary | ICD-10-CM

## 2018-08-08 DIAGNOSIS — E785 Hyperlipidemia, unspecified: Secondary | ICD-10-CM | POA: Diagnosis not present

## 2018-08-08 DIAGNOSIS — R200 Anesthesia of skin: Secondary | ICD-10-CM

## 2018-08-08 DIAGNOSIS — F329 Major depressive disorder, single episode, unspecified: Secondary | ICD-10-CM

## 2018-08-08 NOTE — Patient Instructions (Addendum)
It was a pleasure to see you today Ms. Owens Shark. Please make the following changes:  -Please continue victoza 1.8mg  daily  -Please take levemir 6units in the morning -Please take humalog 2units at mealtime  If you have any questions or concerns, please call our clinic at [phone] between 9am-5pm and after hours call [phone] and ask for the internal medicine resident on call. If you feel you are having a medical emergency please call 911.   Thank you, we look forward to help you remain healthy!  Lars Mage, MD Internal Medicine PGY2

## 2018-08-08 NOTE — Progress Notes (Addendum)
Patient was seen today in a co-visit with Dr. Maricela Bo.  Freestyle [employer] and Reviewed  Freestyle Libre sensor placed 07/23/18  Week #2 of monitoring  14-day BG average 140---this correlates with an A1C of around 6% to 7%, within target range 64% of the time  Above 180 mg/dL 24% of the time, highest around evening  Below 70 mg/dL 12% of the time, lowest around morning  Below 54 mg/dL 4% of the time  Coefficient of variation 51.1%  Standard deviation: 71.6 mg/dL  Hypoglycemic episodes are mostly occurring during sleeping hours, some days throughout the day. Discussed with Dr. Maricela Bo and Dr. Lynnae January, and educated patient on the following changes:  Reduce insulin degludec Tyler Aas) from 10 units daily to 6 units daily Reduce insulin aspart (Novolog) from 3 units with each meal to 2 units with each meal Increase liraglutide (Victoza) from 1.2 mg daily to 1.8 mg daily Continue to monitor BG and BP at home Patient does report working on health diet/lifestyle, advised her to continue progress  Patient verbalized understanding by repeating back information and was advised to contact me if medication-related questions arise. Patient was also provided an information handout.  Other recommendations for PCP consideration: Patient reports stopping lisinopril due to low BP at home (90s/50s). Patient did bring BP monitor to clinic, which showed some lows, but otherwise SBP in the 100s, up to 140s. BP controlled today without lisinopril. 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

## 2018-08-08 NOTE — Progress Notes (Deleted)
   CC: ***  HPI:  [name] is a 67 y.o.   Past Medical History:  Diagnosis Date  . Allergy    SEASONAL  . Anemia   . Arthritis   . Cataract    PRESENT IN LEFT/ CATARACTS REMOVED FROM RIGHT  . Diabetes mellitus 1996  . DKA (diabetic ketoacidoses) ([place]) 01/02/2013  . Hyperlipidemia   . Hypertension   . Hypotension   . Influenza A (H1N1) 01/03/2013  . Neuromuscular disorder ([place])    NEUROPATHY  . SYNCOPE 05/10/2010   Qualifier: Diagnosis of  By: Marilynne Halsted, RN, BSN, Jacquelyn     Review of Systems:  ***  Physical Exam:  Vitals:   08/08/18 1033  BP: (!) 149/69  Pulse: 89  Temp: 98.7 F (37.1 C)  TempSrc: Oral  SpO2: 100%  Weight: 130 lb 8 oz (59.2 kg)  Height: 5' 1.5" (1.562 m)   ***  Assessment & Plan:   See Encounters Tab for problem based charting.  Patient {GC/GE:[id number]::"discussed with","seen with"} Dr. {NAMES:[id number]::"Butcher","Granfortuna","E. Hoffman","Klima","Mullen","Narendra","Raines","Vincent"}

## 2018-08-08 NOTE — Assessment & Plan Note (Signed)
The patient's last A1c was 9.1 in May 2019.  She is currently on liraglutide 1.8 mg daily, Levemir 10 units daily, lispro 3 units 3 times daily. The patient was placed on CGM on 07/23/2018.  Her average glucose per CGM was 140 and she has a 12% time during which she was below 70 and a significant portion of that time was spent during the nighttime hours.  Assessment and plan Based on the CGM readings there was concern for hypoglycemia.  Therefore, will decrease Levemir to 6 units daily and lispro to 2 units 3 times daily.  We will continue Victoza at 1.8 mg daily.  -Recommend the patient continue lifestyle modifications -CGM follow-up in 2 weeks -Follow-up with PCP in 1 month

## 2018-08-08 NOTE — Assessment & Plan Note (Addendum)
The patient's blood pressure during this visit was 149/69.  At previous visit she was hypotensive with a reading of 87/62, 102 that was thought to be due to hypovolemia secondary to osmotic diuresis.  Therefore, the patient's lisinopril 40 mg daily was held.  Assessment and plan Since the patient is not at goal blood pressure of less than 130/80 today and the patient's volume status is euvolemic encourage the patient to resume her lisinopril 40 mg daily.  -Continue lisinopril 40 mg daily

## 2018-08-08 NOTE — Progress Notes (Addendum)
CC: Diabetes Follow up  HPI:  [name] is a 67 y.o. year old with diabetes mellitus type 2, essential hypertension, hyperlipidemia, major depressive disorder who presents for diabetes follow up. Please see problem based charting for evaluation, assessment, and plan.   Past Medical History:  Diagnosis Date  . Allergy    SEASONAL  . Anemia   . Arthritis   . Cataract    PRESENT IN LEFT/ CATARACTS REMOVED FROM RIGHT  . Diabetes mellitus 1996  . DKA (diabetic ketoacidoses) ([place]) 01/02/2013  . Hyperlipidemia   . Hypertension   . Hypotension   . Influenza A (H1N1) 01/03/2013  . Neuromuscular disorder ([place])    NEUROPATHY  . SYNCOPE 05/10/2010   Qualifier: Diagnosis of  By: Amanda Halsted, RN, BSN, Amanda [name]     Review of Systems:   Has headaches, tingling, numbness  Denies chest pain, shortness of breath, dizziness  Physical Exam:  Vitals:   08/08/18 1033 08/08/18 1058  BP: (!) 149/69 135/67  Pulse: 89 88  Temp: 98.7 F (37.1 C)   TempSrc: Oral   SpO2: 100%   Weight: 130 lb 8 oz (59.2 kg)   Height: 5' 1.5" (1.562 m)    Physical Exam  Constitutional: She appears well-developed and well-nourished. No distress.  HENT:  Head: Normocephalic and atraumatic.  Eyes: Conjunctivae are normal.  Cardiovascular: Normal rate, regular rhythm and normal heart sounds.  Respiratory: Effort normal and breath sounds normal. No respiratory distress. She has no wheezes.  GI: Soft. Bowel sounds are normal. She exhibits no distension. There is no tenderness.  Musculoskeletal: She exhibits no edema.  Neurological: She is alert.  Skin: She is not diaphoretic. No erythema.  Psychiatric: She has a normal mood and affect. Her behavior is normal. Judgment and thought content normal.      Assessment & Plan:   See Encounters Tab for problem based charting.  Diabetes mellitus Type2 The patient's last A1c was 9.1 in May 2019.  She is currently on liraglutide 1.8 mg daily, Levemir 10 units  daily, lispro 3 units 3 times daily. The patient was placed on CGM on 07/23/2018.  Her average glucose per CGM was 140 and she has a 12% time during which she was below 70 and a significant portion of that time was spent during the nighttime hours.  Assessment and plan Based on the CGM readings there was concern for hypoglycemia.  Therefore, will decrease Levemir to 6 units daily and lispro to 2 units 3 times daily.  We will continue Victoza at 1.8 mg daily.  -Recommend the patient continue lifestyle modifications -CGM follow-up in 2 weeks -Follow-up with PCP in 1 month  Amanda [name] wore the CGM for 14 days. The average reading was 145, % time in target was 64%, She was above180 24% of the time, below 7012% of the time, below 54 4% of the time. Intervention will be to decrease Levemir to 6 units daily and lispro to 2 units 3 times daily.  We will continue Victoza at 1.8 mg daily. The patient will be scheduled to see Amanda [name] for a final appointment.    Essential hypertension The patient's blood pressure during this visit was 149/69.  At previous visit she was hypotensive with a reading of 87/62, 102 that was thought to be due to hypovolemia secondary to osmotic diuresis.  Therefore, the patient's lisinopril 40 mg daily was held.  Assessment and plan Since the patient is not at goal blood pressure of less  than 130/80 today and the patient's volume status is euvolemic encourage the patient to resume her lisinopril 40 mg daily.  -Continue lisinopril 40 mg daily   Patient discussed with Amanda [name]

## 2018-08-10 NOTE — Progress Notes (Signed)
Internal Medicine Clinic Attending Case discussed with Dr. Maricela Bo at the time of the visit.  We reviewed the resident's history and exam and pertinent patient test results. I personally reviewed the CGM data & the resident's interpretation. I agree with the assessment, diagnosis, and plan of care documented in the resident's note.

## 2018-08-27 ENCOUNTER — Encounter: Payer: Medicare Other | Admitting: Internal Medicine

## 2018-08-28 ENCOUNTER — Other Ambulatory Visit: Payer: Self-pay

## 2018-08-28 ENCOUNTER — Ambulatory Visit: Payer: Medicare Other | Admitting: Dietician

## 2018-08-28 ENCOUNTER — Encounter: Payer: Self-pay | Admitting: Internal Medicine

## 2018-08-28 ENCOUNTER — Ambulatory Visit (INDEPENDENT_AMBULATORY_CARE_PROVIDER_SITE_OTHER): Payer: Medicare Other | Admitting: Internal Medicine

## 2018-08-28 ENCOUNTER — Encounter: Payer: Self-pay | Admitting: Dietician

## 2018-08-28 VITALS — BP 149/83 | HR 84 | Temp 98.4°F | Wt 125.8 lb

## 2018-08-28 DIAGNOSIS — I10 Essential (primary) hypertension: Secondary | ICD-10-CM

## 2018-08-28 DIAGNOSIS — Z794 Long term (current) use of insulin: Principal | ICD-10-CM

## 2018-08-28 DIAGNOSIS — Z79899 Other long term (current) drug therapy: Secondary | ICD-10-CM | POA: Diagnosis not present

## 2018-08-28 DIAGNOSIS — E118 Type 2 diabetes mellitus with unspecified complications: Secondary | ICD-10-CM

## 2018-08-28 DIAGNOSIS — Z23 Encounter for immunization: Secondary | ICD-10-CM

## 2018-08-28 DIAGNOSIS — E119 Type 2 diabetes mellitus without complications: Secondary | ICD-10-CM | POA: Diagnosis not present

## 2018-08-28 MED ORDER — ACCU-CHEK SOFTCLIX LANCETS MISC: 7 refills | Status: DC

## 2018-08-28 MED ORDER — GLUCOSE BLOOD VI STRP: ORAL_STRIP | 12 refills | Status: DC

## 2018-08-28 MED ORDER — SITAGLIP PHOS-METFORMIN HCL ER 50-1000 MG PO TB24: 1.0000 | ORAL_TABLET | Freq: Two times a day (BID) | ORAL | 3 refills | Status: DC

## 2018-08-28 MED ORDER — FREESTYLE LIBRE 14 DAY SENSOR MISC: 1.0000 | Freq: Four times a day (QID) | 12 refills | Status: DC

## 2018-08-28 MED ORDER — FREESTYLE LIBRE 14 DAY READER DEVI: 1.0000 | Freq: Four times a day (QID) | 0 refills | Status: DC

## 2018-08-28 MED FILL — ACCU-CHEK SOFTCLIX LANCETS: 50 days supply | Qty: 200 | Fill #0

## 2018-08-28 MED FILL — ACCU-CHEK AVIVA PLUS TEST S: 25 days supply | Qty: 100 | Fill #0

## 2018-08-28 NOTE — Patient Instructions (Addendum)
Thank you for visiting clinic today. As we discussed I am stopping Victoza because of your concern of decreased appetite and weight loss. Please stop taking Metformin, I am replacing it with a combination pill which has Metformin and sitagliptin.  You will continue taking 1 pill twice a day. Continue taking your insulin as you were taking it before. You are provided with a new glucometer, please check your blood sugar at least 4 times a day and bring that log with you during your next follow-up appointment in 2 weeks to make necessity changes in your insulin requirement. Please also check your blood pressure at home while resting and bring that log with you.  You will continue taking your lisinopril as you was doing it before. Follow up in 2 weeks.  Metformin; Sitagliptin Oral Tablets What is this medicine? METFORMIN; SITAGLIPTIN (met FOR min; sit a GLIP tin) is a combination of 2 medicines used to treat type 2 diabetes. This medicine lowers blood sugar. Treatment is combined with a balanced diet and exercise. This medicine may be used for other purposes; ask your health care provider or pharmacist if you have questions. COMMON BRAND NAME(S): Janumet What should I tell my health care provider before I take this medicine? They need to know if you have any of these conditions: -become easily dehydrated -diabetic ketoacidosis -frequently drink alcohol-containing beverages -heart disease, past heart attack -kidney disease -liver disease -pancreatitis -polycystic ovary syndrome -previous swelling of the tongue, face, or lips with difficulty breathing, difficulty swallowing, hoarseness, or tightening of the throat -serious infection or injury -thyroid disease -undergoing surgery or certain x-ray procedures with injectable contrast agents -an unusual or allergic reaction to metformin, sitagliptin, other medicines, foods, dyes, or preservatives -pregnant or trying to get  pregnant -breast-feeding How should I use this medicine? Take this medicine by mouth with a glass of water. Follow the directions on the prescription label. Take this medicine with food. Take your doses at regular intervals. Do not take your medicine more often than directed. Do not stop taking except on your doctor's advice. A special MedGuide will be given to you by the pharmacist with each prescription and refill. Be sure to read this information carefully each time. Talk to your pediatrician regarding the use of this medicine in children. Special care may be needed. Overdosage: If you think you have taken too much of this medicine contact a poison control center or emergency room at once. NOTE: This medicine is only for you. Do not share this medicine with others. What if I miss a dose? If you miss a dose, take it as soon as you can. If it is almost time for your next dose, take only that dose. Do not take double or extra doses. What may interact with this medicine? Do not take this medicine with any of the following medications: -dofetilide -gatifloxacin -certain contrast medicines given before X-rays, CT scans, MRI, or other procedures This medicine may also interact with the following medications: -acetazolamide -amiloride -certain antiviral medicines for HIV infection or hepatitis -cimetidine -crizotinib -digoxin -diuretics -female hormones, like estrogens or progestins and birth control pills -glycopyrrolate -isoniazid -lamotrigine -medicines for blood pressure, heart disease, irregular heart beat -memantine -methazolamide -midodrine -morphine -nicotinic acid -phenothiazines like chlorpromazine, mesoridazine, prochlorperazine, thioridazine -phenytoin -procainamide -propantheline -quinidine -quinine -ranitidine -ranolazine -steroid medicines like prednisone or cortisone -stimulant medicines for attention disorders, weight loss, or to stay awake -thyroid  medicines -topiramate -trimethoprim -trospium -vancomycin -vandetanib -zonisamide This list may not describe all possible  interactions. Give your health care provider a list of all the medicines, herbs, non-prescription drugs, or dietary supplements you use. Also tell them if you smoke, drink alcohol, or use illegal drugs. Some items may interact with your medicine. What should I watch for while using this medicine? Visit your doctor or health care professional for regular checks on your progress. A test called the HbA1C (A1C) will be monitored. This is a simple blood test. It measures your blood sugar control over the last 2 to 3 months. You will receive this test every 3 to 6 months. Learn how to check your blood sugar. Learn the symptoms of high or low blood sugar and how to manage them. Always carry a quick-source of sugar with you in case you have symptoms of low blood sugar. Examples include hard sugar candy or glucose tablets. Make sure others know that you can choke if you eat or drink when you develop serious symptoms of low blood sugar, such as seizures or unconsciousness. They must get medical help at once. Tell your doctor or health care professional if you have high blood sugar. You might need to change the dose of your medicine. If you are sick or exercising more than usual, you might need to change the dose of your medicine. Do not skip meals. Ask your doctor or health care professional if you should avoid alcohol. Many nonprescription cough and cold products contain sugar or alcohol. These can affect blood sugar. This medicine may cause ovulation in premenopausal women who do not have regular monthly periods. This may increase your chances of becoming pregnant. You should not take this medicine if you become pregnant or think you may be pregnant. Talk with your doctor or health care professional about your birth control options while taking this medicine. Contact your doctor or health  care professional right away if think you are pregnant. If you are going to need surgery, a MRI, CT scan, or other procedure, tell your doctor that you are taking this medicine. You may need to stop taking this medicine before the procedure. Wear a medical ID bracelet or chain, and carry a card that describes your disease and details of your medicine and dosage times. What side effects may I notice from receiving this medicine? Side effects that you should report to your doctor or health care professional as soon as possible: -allergic reactions like skin rash, itching or hives, swelling of the face, lips, or tongue -breathing problems -feeling faint or lightheaded, falls -fever, chills -joint pain -loss of appetite -muscle aches or pains -nausea, vomiting -signs and symptoms of low blood sugar such as feeling anxious, confusion, dizziness, increased hunger, unusually weak or tired, sweating, shakiness, cold, irritable, headache, blurred vision, fast heartbeat, loss of consciousness -slow or irregular heartbeat -swelling of the hands, legs, and/or feet -unusual stomach pain or discomfort Side effects that usually do not require medical attention (report to your doctor or health care professional if they continue or are bothersome): -diarrhea -headache -metallic taste in the mouth -stomach gas -stuffy or runny nose This list may not describe all possible side effects. Call your doctor for medical advice about side effects. You may report side effects to FDA at 1-800-FDA-1088. Where should I keep my medicine? Keep out of the reach of children. Store at room temperature between 15 and 30 degrees C (59 and 86 degrees F). Throw away any unused medicine after the expiration date. NOTE: This sheet is a summary. It may not cover all  possible information. If you have questions about this medicine, talk to your doctor, pharmacist, or health care provider.  2018 Elsevier/Gold Standard (2014-08-15  17:51:37)

## 2018-08-28 NOTE — Telephone Encounter (Signed)
Called the patient and advised her to continue taking her original Metformin and Victoza both till we can talk with Dr. Maudie Mercury and find out how we can help her with Janumet.

## 2018-08-28 NOTE — Telephone Encounter (Signed)
Dr. Reesa Chew saw this patient today. I will forward this to her

## 2018-08-28 NOTE — Progress Notes (Signed)
Provided Ms. Escue with a new blood glucose meter ( sample accu chek Aviva meter) and instruction son how to use and obtain supplies. I also reviewed times of day to check blood sugar and why, criteria to qualify for a personal Freestyle CGM.  P recommend follow up with me in 1 week to review glucose values and continue education on insulin action, meal planning, timing, purpose (food insulin vs correction insulin) [name], RD 08/28/2018 1:46 PM.

## 2018-08-28 NOTE — Telephone Encounter (Signed)
Pt states SitaGLIPtin-MetFORMIN HCl 50-1000 MG TB24 cost $45.00, cannot afford this med. Please call pt back.

## 2018-08-28 NOTE — Progress Notes (Signed)
   CC: For follow-up of her diabetes and hypertension.  HPI:  [name] is a 67 y.o. past medical history as listed below came to the clinic for follow-up of her diabetes and hypertension.  Patient was using CGM for 2 weeks in August 2019.  Her Levemir dose was decreased to 6 units from 10 because of some concerns of hypoglycemia during the night.  Patient did bring her glucometer but we were unable to download and it was showing wrong dates and times.  To patient she did had 1 or 2 more incidence of hypoglycemia with blood sugar in low 80s and high 70s and she does become symptomatic. She was also concerned about her weight loss, she is on Victoza and experiencing mild nausea and decreased appetite and a weight loss of about 20 pound in the past year.  She does not want to lose anymore weight and was asking to change her regimen. She was also interested in getting her own personal CGM.  She has no other complaints today.  Please see assessment and plan for her chronic conditions.  Past Medical History:  Diagnosis Date  . Allergy    SEASONAL  . Anemia   . Arthritis   . Cataract    PRESENT IN LEFT/ CATARACTS REMOVED FROM RIGHT  . Diabetes mellitus 1996  . DKA (diabetic ketoacidoses) ([place]) 01/02/2013  . Hyperlipidemia   . Hypertension   . Hypotension   . Influenza A (H1N1) 01/03/2013  . Neuromuscular disorder ([place])    NEUROPATHY  . SYNCOPE 05/10/2010   Qualifier: Diagnosis of  By: Marilynne Halsted, RN, BSN, Jacquelyn     Review of Systems: Negative except mentioned in HPI.  Physical Exam:  Vitals:   08/28/18 1114  BP: 140/77  Pulse: 86  Temp: 98.4 F (36.9 C)  TempSrc: Oral  SpO2: 100%  Weight: 125 lb 12.8 oz (57.1 kg)   General: Vital signs reviewed.  Patient is well-developed and well-nourished, in no acute distress and cooperative with exam.  Head: Normocephalic and atraumatic. Eyes: EOMI, conjunctivae normal, no scleral icterus.  Cardiovascular: RRR, S1 normal, S2  normal, no murmurs, gallops, or rubs. Pulmonary/Chest: Clear to auscultation bilaterally, no wheezes, rales, or rhonchi. Abdominal: Soft, non-tender, non-distended, BS +, Extremities: No lower extremity edema bilaterally,  pulses symmetric and intact bilaterally. No cyanosis or clubbing. Skin: Warm, dry and intact. No rashes or erythema. Psychiatric: Normal mood and affect. speech and behavior is normal. Cognition and memory are normal.  Assessment & Plan:   See Encounters Tab for problem based charting.  Patient discussed with Dr. Eppie Gibson.

## 2018-08-29 MED ORDER — METFORMIN HCL ER 500 MG PO TB24: 1000.0000 mg | ORAL_TABLET | Freq: Two times a day (BID) | ORAL | 2 refills | Status: DC

## 2018-08-29 MED FILL — METFORMIN HCL ER 500 MG TAB: 500 | 90 days supply | Qty: 360 | Fill #1

## 2018-08-29 NOTE — Assessment & Plan Note (Signed)
BP Readings from Last 3 Encounters:  08/28/18 (!) 149/83  08/08/18 135/67  07/23/18 (!) 158/78   She was having elevated blood pressure. According to patient when she checked her blood pressure earlier today in the morning it was 119/65.  She has not taken any medications for the day yet.  Asked the patient to keep checking her blood pressure at home and make a log, she should bring that log to her next follow-up appointment. Elevated during next follow-up visit. Continue current management.

## 2018-08-29 NOTE — Assessment & Plan Note (Addendum)
We were unable to download her glucometer. Found to have wrong dates and time on her glucometer. Patient does want to have her on personal CGM, a prescription for freestyle Elenor Legato was sent.  She might have to wait because we could not provide prove that she is checking her blood sugar 4 times a day.  Patient is on a long and short-acting insulin and injecting 4 times daily. Unable to obtain a download from the glucometer. She was provided with a new glucometer and asked to check her blood sugar at least 4 times every day to qualify for personal CGM.  According to patient she did had 2 episodes of hypoglycemia when she did become mildly symptomatic, symptoms relieved with food.  There was no documentation available. She has uncontrolled diabetes with A1c of more than 14 checked last month.  She is on a very low dose of insulin.  She was also concerned about her weight loss which can be due to Victoza.  She wants to stop taking Victoza.  Initially Victoza was discontinued and patient was given a prescription for Janumet. Later got a call from patient that her insurance is not paying for Janumet and she cannot afford it.  She is getting Victoza with some assistance program. I asked the patient to resume her existing regimen which includes Metformin 1000 mg twice daily, Tresiba 6 units at bedtime and NovoLog 2 units with meals 3 times a day and Victoza. Needs to follow-up in 1 to 2 weeks with her glucometer reading for further adjustment. Message was sent to Dr. Maudie Mercury to look for any persistent programs to get her Janumet.

## 2018-08-29 NOTE — Progress Notes (Signed)
Case discussed with Dr. Amin at the time of the visit.  We reviewed the resident's history and exam and pertinent patient test results.  I agree with the assessment, diagnosis and plan of care documented in the resident's note. 

## 2018-08-29 NOTE — Assessment & Plan Note (Signed)
Flu shot was provided.

## 2018-09-04 ENCOUNTER — Telehealth: Payer: Self-pay | Admitting: Dietician

## 2018-09-04 ENCOUNTER — Ambulatory Visit: Payer: Medicare Other | Admitting: Pharmacist

## 2018-09-04 ENCOUNTER — Encounter: Payer: Medicare Other | Admitting: Dietician

## 2018-09-04 NOTE — Telephone Encounter (Signed)
Blood sugar and blood pressure is better, tremendously hungry. Taking victoza, grabs food rather than eating a meal. Feels she needs more help with meal planning. "beginning to come out of the quicksand" She agreed to continue checking blood sugar before and after meals and keeping records of foods eaten. Rescheduled appointment to begin on carb counting before she sees dr. Shan Levans next Monday.

## 2018-09-10 ENCOUNTER — Encounter: Payer: Medicare Other | Admitting: Internal Medicine

## 2018-09-10 ENCOUNTER — Telehealth: Payer: Self-pay | Admitting: Dietician

## 2018-09-10 ENCOUNTER — Ambulatory Visit: Payer: Medicare Other | Admitting: Dietician

## 2018-09-12 NOTE — Telephone Encounter (Signed)
Returned Amanda [name]'s call at her request. She had to miss her appointment this week with certified diabetes educator. She was at the eye doctor at the time of my call and could not talk. She agreed to follow up at her convenience.

## 2018-09-24 MED FILL — LATANOPROST 0.005% EYE DRP: 0.005 | 25 days supply | Qty: 3 | Fill #1

## 2018-09-28 ENCOUNTER — Ambulatory Visit: Payer: Medicare Other | Admitting: Podiatry

## 2018-10-08 ENCOUNTER — Encounter: Payer: Self-pay | Admitting: Internal Medicine

## 2018-10-08 ENCOUNTER — Ambulatory Visit (INDEPENDENT_AMBULATORY_CARE_PROVIDER_SITE_OTHER): Payer: Medicare Other | Admitting: Dietician

## 2018-10-08 ENCOUNTER — Other Ambulatory Visit: Payer: Self-pay

## 2018-10-08 ENCOUNTER — Ambulatory Visit (INDEPENDENT_AMBULATORY_CARE_PROVIDER_SITE_OTHER): Payer: Medicare Other | Admitting: Internal Medicine

## 2018-10-08 ENCOUNTER — Encounter: Payer: Self-pay | Admitting: Dietician

## 2018-10-08 VITALS — BP 136/76 | HR 88 | Temp 98.7°F | Wt 128.1 lb

## 2018-10-08 DIAGNOSIS — Z794 Long term (current) use of insulin: Secondary | ICD-10-CM | POA: Diagnosis not present

## 2018-10-08 DIAGNOSIS — Z79899 Other long term (current) drug therapy: Secondary | ICD-10-CM

## 2018-10-08 DIAGNOSIS — Z9111 Patient's noncompliance with dietary regimen: Secondary | ICD-10-CM | POA: Diagnosis not present

## 2018-10-08 DIAGNOSIS — E1140 Type 2 diabetes mellitus with diabetic neuropathy, unspecified: Secondary | ICD-10-CM | POA: Diagnosis not present

## 2018-10-08 DIAGNOSIS — E118 Type 2 diabetes mellitus with unspecified complications: Secondary | ICD-10-CM

## 2018-10-08 DIAGNOSIS — Z6823 Body mass index (BMI) 23.0-23.9, adult: Secondary | ICD-10-CM

## 2018-10-08 DIAGNOSIS — E11649 Type 2 diabetes mellitus with hypoglycemia without coma: Secondary | ICD-10-CM

## 2018-10-08 DIAGNOSIS — R238 Other skin changes: Secondary | ICD-10-CM

## 2018-10-08 DIAGNOSIS — Z713 Dietary counseling and surveillance: Secondary | ICD-10-CM

## 2018-10-08 DIAGNOSIS — E1142 Type 2 diabetes mellitus with diabetic polyneuropathy: Secondary | ICD-10-CM

## 2018-10-08 DIAGNOSIS — Z833 Family history of diabetes mellitus: Secondary | ICD-10-CM

## 2018-10-08 LAB — GLUCOSE, CAPILLARY
Glucose-Capillary: 54 mg/dL — ABNORMAL LOW (ref 70–99)
Glucose-Capillary: 112 mg/dL — ABNORMAL HIGH (ref 70–99)

## 2018-10-08 LAB — POCT GLYCOSYLATED HEMOGLOBIN (HGB A1C): Hemoglobin A1C: 7.9 % — AB (ref 4.0–5.6)

## 2018-10-08 MED ORDER — CAPSAICIN-MENTHOL-METHYL SAL 0.025-1-12 % EX CREA: TOPICAL_CREAM | CUTANEOUS | 3 refills | Status: DC

## 2018-10-08 MED ORDER — DULOXETINE HCL 30 MG PO CPEP: ORAL_CAPSULE | ORAL | 2 refills | Status: DC

## 2018-10-08 NOTE — Progress Notes (Signed)
CC: left leg pain likely 2/2 diabetic neuropathy, T2DM,   HPI:  [name] is a 67 y.o. female with PMH below.  Today we will address T2DM and new left leg pain likely 2/2 diabetic neuropathy.  Please see A&P for status of the patient's chronic medical conditions  Past Medical History:  Diagnosis Date  . Allergy    SEASONAL  . Anemia   . Arthritis   . Cataract    PRESENT IN LEFT/ CATARACTS REMOVED FROM RIGHT  . Diabetes mellitus 1996  . DKA (diabetic ketoacidoses) ([place]) 01/02/2013  . Hyperlipidemia   . Hypertension   . Hypotension   . Influenza A (H1N1) 01/03/2013  . Neuromuscular disorder ([place])    NEUROPATHY  . SYNCOPE 05/10/2010   Qualifier: Diagnosis of  By: Marilynne Halsted, RN, BSN, Jacquelyn     Review of Systems:  ROS: Pulmonary: pt denies increased work of breathing, shortness of breath,  Cardiac: pt denies palpitations, chest pain,  Abdominal: pt denies abdominal pain, nausea, vomiting, or diarrhea   Physical Exam:  Vitals:   10/08/18 1520  BP: 136/76  Pulse: 88  Temp: 98.7 F (37.1 C)  TempSrc: Oral  SpO2: 100%  Weight: 128 lb 1.6 oz (58.1 kg)   General: healthy appearing female, not in distress Cardiac: normal rate and rhythm, clear s1 and s2 Pulmonary: CTAB, not in distress Abdominal: non distended abdomen, soft and nontender, normal BS Extremities: tender to palpation lateral left lower extremity in a patch of skin spanning from 3cm above the lateral malleolus and 6cm below the lateral knee.  There is no erythema, edema, rash or induration,  present.  Homan's sign is negative, sensation is intact and vibratory sense is also likely in tact (was somewhat equivocal) LLE size is consistent with the right leg.  RLE normal, non tender, no edema, erythema or induration, rash Psych: Alert, conversant, in good spirits   Social History   Socioeconomic History  . Marital status: Married    Spouse name: Not on file  . Number of children: 1  . Years of  education: Not on file  . Highest education level: Not on file  Occupational History  . Occupation: retired  Scientific laboratory technician  . Financial resource strain: Not on file  . Food insecurity:    Worry: Not on file    Inability: Not on file  . Transportation needs:    Medical: Not on file    Non-medical: Not on file  Tobacco Use  . Smoking status: Never Smoker  . Smokeless tobacco: Never Used  Substance and Sexual Activity  . Alcohol use: No    Alcohol/week: 0.0 standard drinks  . Drug use: No  . Sexual activity: Not on file  Lifestyle  . Physical activity:    Days per week: Not on file    Minutes per session: Not on file  . Stress: Not on file  Relationships  . Social connections:    Talks on phone: Not on file    Gets together: Not on file    Attends religious service: Not on file    Active member of club or organization: Not on file    Attends meetings of clubs or organizations: Not on file    Relationship status: Not on file  . Intimate partner violence:    Fear of current or ex partner: Not on file    Emotionally abused: Not on file    Physically abused: Not on file    Forced sexual  activity: Not on file  Other Topics Concern  . Not on file  Social History Narrative  . Not on file    Family History  Problem Relation Age of Onset  . Diabetes Mother   . Hypertension Mother   . Breast cancer Mother   . Aneurysm Father   . Diabetes Sister   . Hypertension Sister   . Kidney disease Sister        One Kidney transplant, one sister on HD  . Breast cancer Sister     Assessment & Plan:   See Encounters Tab for problem based charting.  Patient seen with Dr. Eppie Gibson

## 2018-10-08 NOTE — Progress Notes (Signed)
Hypoglycemic Event  CBG: 54   Treatment: 15 GM carbohydrate snack x 2   Symptoms: None   Follow-up CBG: Time:1606 CBG Result:112  Possible Reasons for Event: Inadequate meal intake  Comments/MD notified:yes ([name])    Regenia Skeeter, Darlene [place]

## 2018-10-08 NOTE — Patient Instructions (Signed)
Your diabetes is doing much better.  We will stop your lunch insulin, no more novolog at lunch.  We will keep everything else the same.  Please check your blood sugars as we discussed first thing in the morning before breakfast, once before lunch and once before bedtime.  We will start you back on your cymbalta, and I have prescribed a cream that will be helpful for your left leg pain.

## 2018-10-08 NOTE — Progress Notes (Signed)
  Medical Nutrition Therapy:  Appt start time: 1430 end time:  1510. Visit # 1  Assessment:  Primary concerns today: meal planning for diabeteas.  Ms. Arlen is most concerned with her glycemic control and reports her blood sugar are much better now. She is eating and drinking modest portions, more vegetables and several small servings of fruits daily. Her beverages are low sugar. Her weight is appropriate.   She does not know how to read labels for carb content. She also reports problems with alternating diarrhea and constipation despite her water, fruit and vegetable intake.   Preferred Learning Style: No preference indicated  Learning Readiness: Ready, Change in progress  ANTHROPOMETRICS:Estimated body mass index is 23.81 kg/m as calculated from the following:   Height as of 08/08/18: 5' 1.5" (1.562 m).   Weight as of an earlier encounter on 10/08/18: 128 lb 1.6 oz (58.1 kg).  WEIGHT HISTORY: Wt Readings from Last 5 Encounters:  10/08/18 128 lb 1.6 oz (58.1 kg)  08/28/18 125 lb 12.8 oz (57.1 kg)  08/08/18 130 lb 8 oz (59.2 kg)  07/23/18 127 lb 8 oz (57.8 kg)  05/07/18 134 lb 3.2 oz (60.9 kg)   SLEEP:need to assess at future visit MEDICATIONS: victoza 1.2 daily, 6 units Tresiba daily, 2 units Novolog three times daily before meals BLOOD SUGAR:METER DOWNLOAD showed most results in target.  Lab Results  Component Value Date   HGBA1C 7.9 (A) 10/08/2018   DIETARY INTAKE: Usual eating pattern includes 3 meals and 0-2 snacks per day. Everyday foods include fruits, vegetables.  Avoided foods include fried foods.  Progress Towards Goal(s):  Some progress.   Nutritional Diagnosis:  NB-1.1 Food and nutrition-related knowledge deficit As related to  lack of sufficient training to build self confidence in diabetes self care.  As evidenced by her report and beign soemwhat familiar with carb containing foods, but not reading labels or aware of all food groups that are high in carbs .     Intervention:  Nutrition education about carb counting meal planning Coordination of care: none  Teaching Method Utilized: Visual,Auditory,Hands on Handouts given during visit include:avs, carb counting meal planning booklet Barriers to learning/adherence to lifestyle change: competing values Demonstrated degree of understanding via:  Teach Back   Monitoring/Evaluation:  Dietary intake, exercise, meter, and body weight in 4 week(s). [name], RD 10/09/2018 3:28 PM.

## 2018-10-11 NOTE — Progress Notes (Signed)
I saw and evaluated the patient.  I personally confirmed the key portions of Dr. [name]'s history and exam and reviewed pertinent patient test results.  The assessment, diagnosis, and plan were formulated together and I agree with the documentation in the resident's note. 

## 2018-10-11 NOTE — Assessment & Plan Note (Signed)
Lab Results  Component Value Date   HGBA1C 7.9 (A) 10/08/2018   Ms. Maiers is taking a little more time for herself recently and is working hard on her diabetes.  She is making great strides with her insulin and taking her blood sugars.  She admits to some continued dietary indiscretion but much less than before.  She was on metformin 1000 mg twice daily, Victoza 1.2 mg daily, 6 units of degludec and 2 units NovoLog 3 times daily with meals.  We reviewed her blood sugar log today.  Her fasting blood sugars look excellent within goal, although a few readings taken her before lunch blood sugars are also excellent.  However on several occasions she was having low blood sugars before dinner.  Reports that lunch is her largest meal of the day.  She also had a low blood sugar in the office today which responded well to treatment.  She is asymptomatic during these lows although she did say occasionally she feels dizzy she is not sure if that is correlated.    -As this is low blood sugar also happened during the afternoon I think it is prudent to stop her lunchtime insulin altogether -She was instructed to watch out for lows we can continue to down titrate her mealtime insulin as needed and she may eventually be able to have great control on just a long-acting agent -Otherwise I will not alter her regimen from the above assessment

## 2018-10-11 NOTE — Assessment & Plan Note (Signed)
Patient has had issues with her diabetic neuropathy for years at least 10.  Comes in today with a complaint of left lateral lower leg pain on examination it is not consistent for infectious etiology, embolus, there are no skin changes suggestive of zoster and she has no decreased sensation or signs of a radiculopathy. She describes the pain as a pens and needles feeling that has been constant for four weeks.  In reviewing her medications she brought with her for some reason she has not been picking up her cymbalta this has worked well for her neuropathy in the past.    -I feel a trial of cymbalta and capsaicin cream will hopefully alleviate her symptoms.  Although the pain is somewhat atypical in location when compared to her usual neuropathy the symptoms are consistent and I cannot think of a better diagnosis at this time.   -She is instructed to contact me if she does not get any relief or something changes.

## 2018-10-16 ENCOUNTER — Ambulatory Visit: Payer: Medicare Other | Admitting: Podiatry

## 2018-10-18 MED FILL — DULoxetine HCL 30 MG CPEP: 30 | 30 days supply | Qty: 60 | Fill #0

## 2018-10-19 ENCOUNTER — Ambulatory Visit: Payer: Medicare Other | Admitting: Podiatry

## 2018-10-19 DIAGNOSIS — B351 Tinea unguium: Secondary | ICD-10-CM | POA: Diagnosis not present

## 2018-10-19 DIAGNOSIS — E1142 Type 2 diabetes mellitus with diabetic polyneuropathy: Secondary | ICD-10-CM | POA: Diagnosis not present

## 2018-10-19 DIAGNOSIS — M79675 Pain in left toe(s): Secondary | ICD-10-CM | POA: Diagnosis not present

## 2018-10-19 DIAGNOSIS — M79674 Pain in right toe(s): Secondary | ICD-10-CM

## 2018-10-19 NOTE — Patient Instructions (Signed)

## 2018-10-26 MED FILL — PENICILLIN VK 500 MG TABLET: 500 | 7 days supply | Qty: 21 | Fill #0

## 2018-11-07 ENCOUNTER — Encounter: Payer: Self-pay | Admitting: Podiatry

## 2018-11-07 NOTE — Progress Notes (Signed)
Subjective: Amanda [name] presents today with history of diabetic neuropathy with cc of painful, mycotic toenails.  Pain is aggravated when wearing enclosed shoe gear and relieved with periodic professional debridement.  Patient has peripheral neuropathy managed with Cymbalta.   Past Medical History:  Diagnosis Date  . Allergy    SEASONAL  . Anemia   . Arthritis   . Cataract    PRESENT IN LEFT/ CATARACTS REMOVED FROM RIGHT  . Diabetes mellitus 1996  . DKA (diabetic ketoacidoses) ([place]) 01/02/2013  . Hyperlipidemia   . Hypertension   . Hypotension   . Influenza A (H1N1) 01/03/2013  . Neuromuscular disorder ([place])    NEUROPATHY  . SYNCOPE 05/10/2010   Qualifier: Diagnosis of  By: Marilynne Halsted RN, BSN, Jacquelyn     Current Outpatient Medications:  .  ACCU-CHEK SOFTCLIX LANCETS lancets, Check blood sugar 4x/day before meal an bedtime, Disp: 200 each, Rfl: 7 .  acetaminophen (TYLENOL) 325 MG tablet, Take 2 tablets (650 mg total) by mouth every 6 (six) hours as needed (or Fever >/= 101)., Disp: 60 tablet, Rfl: 0 .  aspirin EC 81 MG tablet, Take 81 mg by mouth daily., Disp: , Rfl:  .  atorvastatin (LIPITOR) 40 MG tablet, Take 1 tablet (40 mg total) by mouth daily., Disp: 90 tablet, Rfl: 3 .  benzonatate (TESSALON) 100 MG capsule, Take 1 capsule (100 mg total) by mouth every 8 (eight) hours., Disp: 21 capsule, Rfl: 0 .  Capsaicin-Menthol-Methyl Sal (CAPSAICIN-METHYL SAL-MENTHOL) 0.025-1-12 % CREA, Apply over painful areas, Disp: 1 Tube, Rfl: 3 .  cetirizine (ZYRTEC) 5 MG tablet, Take 1 tablet (5 mg total) by mouth daily., Disp: 30 tablet, Rfl: 1 .  Cholecalciferol (VITAMIN D3) 2000 units capsule, Take by mouth., Disp: , Rfl:  .  Continuous Blood Gluc Receiver (FREESTYLE LIBRE 14 DAY READER) DEVI, 1 each by Does not apply route 4 (four) times daily., Disp: 1 Device, Rfl: 0 .  Continuous Blood Gluc Sensor (FREESTYLE LIBRE 14 DAY SENSOR) MISC, 1 each by Does not apply route 4 (four) times daily.,  Disp: 2 each, Rfl: 12 .  cyclobenzaprine (FLEXERIL) 5 MG tablet, Take 1 tablet (5 mg total) by mouth 2 (two) times daily as needed for muscle spasms., Disp: 30 tablet, Rfl: 0 .  dorzolamide-timolol (COSOPT) 22.3-6.8 MG/ML ophthalmic solution, , Disp: , Rfl:  .  DULoxetine (CYMBALTA) 30 MG capsule, Please take 1 tablet daily for one week, then take 2 tablets daily (60mg ) indefinitely., Disp: 180 capsule, Rfl: 2 .  fluticasone (FLONASE) 50 MCG/ACT nasal spray, Place 2 sprays into both nostrils daily., Disp: 16 g, Rfl: 2 .  glucose blood (ACCU-CHEK AVIVA PLUS) test strip, Check blood sugar 4x/day before all meals and bedtime, Disp: 125 each, Rfl: 12 .  insulin aspart (NOVOLOG FLEXPEN) 100 UNIT/ML FlexPen, Inject 2 Units into the skin 3 (three) times daily with meals., Disp: , Rfl:  .  insulin degludec (TRESIBA FLEXTOUCH) 100 UNIT/ML SOPN FlexTouch Pen, Inject 6 Units into the skin daily., Disp: , Rfl:  .  Insulin Pen Needle 33G X 5 MM MISC, 1 applicator by Does not apply route daily. The patient is insulin requiring, ICD 10 code E11.65. The patient tests 5 times per day., Disp: 100 each, Rfl: 3 .  latanoprost (XALATAN) 0.005 % ophthalmic solution, 1 drop at bedtime., Disp: , Rfl:  .  liraglutide (VICTOZA) 18 MG/3ML SOPN, Inject 1.2 mg into the skin., Disp: , Rfl:  .  lisinopril (PRINIVIL,ZESTRIL) 40 MG tablet, Take 1  tablet (40 mg total) by mouth daily., Disp: 90 tablet, Rfl: 3 .  metFORMIN (GLUCOPHAGE-XR) 500 MG 24 hr tablet, Take 2 tablets (1,000 mg total) by mouth 2 (two) times daily., Disp: 360 tablet, Rfl: 2 .  Multiple Vitamins-Minerals (WOMENS 50+ MULTI VITAMIN/MIN) TABS, Take 1 tablet by mouth daily., Disp: 30 tablet, Rfl:     Allergies  Allergen Reactions  . Gabapentin Other (See Comments)    Pt states medication made her feel "out of it".   Pt states medication made her feel "out of it".     Objective:  Vascular Examination: Capillary refill time <3 seconds x 10 digits Dorsalis pedis  pulses present b/l Posterior tibial pulses present b/l Digital hair x 10 digits present Skin temperature gradient WNL b/l  Dermatological Examination: Skin with normal turgor, texture and tone b/l  Toenails 1-5 b/l discolored, thick, dystrophic with subungual debris and pain with palpation to nailbeds due to thickness of nails.  Musculoskeletal: Muscle strength 5/5 to all muscle groups b/l  Neurological: Sensation with 10 gram monofilament. Vibratory sensation   Assessment: 1. Painful onychomycosis toenails 1-5 b/l 2. NIDDM with  neuropathy  Plan: 1. Toenails 1-5 b/l were debrided in length and girth without iatrogenic bleeding. 2. Patient to continue soft, supportive shoe gear 3. Patient to report any pedal injuries to medical professional  4. Follow up 3 months. Patient/POA to call should there be a concern in the interim.

## 2018-11-12 ENCOUNTER — Ambulatory Visit: Payer: Medicare Other | Admitting: Pharmacist

## 2018-11-12 DIAGNOSIS — E118 Type 2 diabetes mellitus with unspecified complications: Secondary | ICD-10-CM

## 2018-11-12 NOTE — Progress Notes (Signed)
S: Amanda [name] is a 67 y.o. female reports to clinical pharmacist appointment for diabetes medication management.  Allergies  Allergen Reactions  . Gabapentin Other (See Comments)    Pt states medication made her feel "out of it".   Pt states medication made her feel "out of it".     Medication Sig  ACCU-CHEK SOFTCLIX LANCETS lancets Check blood sugar 4x/day before meal an bedtime  acetaminophen (TYLENOL) 325 MG tablet Take 2 tablets (650 mg total) by mouth every 6 (six) hours as needed (or Fever >/= 101).  aspirin EC 81 MG tablet Take 81 mg by mouth daily.  atorvastatin (LIPITOR) 40 MG tablet Take 1 tablet (40 mg total) by mouth daily.  benzonatate (TESSALON) 100 MG capsule Take 1 capsule (100 mg total) by mouth every 8 (eight) hours.  Capsaicin-Menthol-Methyl Sal (CAPSAICIN-METHYL SAL-MENTHOL) 0.025-1-12 % CREA Apply over painful areas  cetirizine (ZYRTEC) 5 MG tablet Take 1 tablet (5 mg total) by mouth daily.  Cholecalciferol (VITAMIN D3) 2000 units capsule Take by mouth.  Continuous Blood Gluc Receiver (FREESTYLE LIBRE 14 DAY READER) DEVI 1 each by Does not apply route 4 (four) times daily.  Continuous Blood Gluc Sensor (FREESTYLE LIBRE 14 DAY SENSOR) MISC 1 each by Does not apply route 4 (four) times daily.  cyclobenzaprine (FLEXERIL) 5 MG tablet Take 1 tablet (5 mg total) by mouth 2 (two) times daily as needed for muscle spasms.  dorzolamide-timolol (COSOPT) 22.3-6.8 MG/ML ophthalmic solution   DULoxetine (CYMBALTA) 30 MG capsule Please take 1 tablet daily for one week, then take 2 tablets daily (60mg ) indefinitely.  fluticasone (FLONASE) 50 MCG/ACT nasal spray Place 2 sprays into both nostrils daily.  glucose blood (ACCU-CHEK AVIVA PLUS) test strip Check blood sugar 4x/day before all meals and bedtime  insulin aspart (NOVOLOG FLEXPEN) 100 UNIT/ML FlexPen Inject 2 Units into the skin 3 (three) times daily with meals.  insulin degludec (TRESIBA FLEXTOUCH) 100 UNIT/ML SOPN FlexTouch  Pen Inject 6 Units into the skin daily.  Insulin Pen Needle 33G X 5 MM MISC 1 applicator by Does not apply route daily. The patient is insulin requiring, ICD 10 code E11.65. The patient tests 5 times per day.  latanoprost (XALATAN) 0.005 % ophthalmic solution 1 drop at bedtime.  liraglutide (VICTOZA) 18 MG/3ML SOPN Inject 1.2 mg into the skin.  lisinopril (PRINIVIL,ZESTRIL) 40 MG tablet Take 1 tablet (40 mg total) by mouth daily.  metFORMIN (GLUCOPHAGE-XR) 500 MG 24 hr tablet Take 2 tablets (1,000 mg total) by mouth 2 (two) times daily.  Multiple Vitamins-Minerals (WOMENS 50+ MULTI VITAMIN/MIN) TABS Take 1 tablet by mouth daily.   Past Medical History:  Diagnosis Date  . Allergy    SEASONAL  . Anemia   . Arthritis   . Cataract    PRESENT IN LEFT/ CATARACTS REMOVED FROM RIGHT  . Diabetes mellitus 1996  . DKA (diabetic ketoacidoses) ([place]) 01/02/2013  . Hyperlipidemia   . Hypertension   . Hypotension   . Influenza A (H1N1) 01/03/2013  . Neuromuscular disorder ([place])    NEUROPATHY  . SYNCOPE 05/10/2010   Qualifier: Diagnosis of  By: Marilynne Halsted RN, BSN, Jacquelyn     Social History   Socioeconomic History  . Marital status: Married    Spouse name: Not on file  . Number of children: 1  . Years of education: Not on file  . Highest education level: Not on file  Occupational History  . Occupation: retired  Scientific laboratory technician  . Financial resource strain: Not on file  .  Food insecurity:    Worry: Not on file    Inability: Not on file  . Transportation needs:    Medical: Not on file    Non-medical: Not on file  Tobacco Use  . Smoking status: Never Smoker  . Smokeless tobacco: Never Used  Substance and Sexual Activity  . Alcohol use: No    Alcohol/week: 0.0 standard drinks  . Drug use: No  . Sexual activity: Not on file  Lifestyle  . Physical activity:    Days per week: Not on file    Minutes per session: Not on file  . Stress: Not on file  Relationships  . Social connections:     Talks on phone: Not on file    Gets together: Not on file    Attends religious service: Not on file    Active member of club or organization: Not on file    Attends meetings of clubs or organizations: Not on file    Relationship status: Not on file  Other Topics Concern  . Not on file  Social History Narrative  . Not on file   Family History  Problem Relation Age of Onset  . Diabetes Mother   . Hypertension Mother   . Breast cancer Mother   . Aneurysm Father   . Diabetes Sister   . Hypertension Sister   . Kidney disease Sister        One Kidney transplant, one sister on HD  . Breast cancer Sister    O: Component Value Date/Time   CHOL 158 01/29/2018 1648   HDL 68 01/29/2018 1648   TRIG 51 01/29/2018 1648   AST 11 12/25/2017 1426   ALT 7 12/25/2017 1426   NA 137 07/23/2018 1450   NA 141 10/23/2017 1505   K 4.8 07/23/2018 1450   CL 104 07/23/2018 1450   CO2 25 07/23/2018 1450   GLUCOSE 279 (H) 07/23/2018 1450   HGBA1C 7.9 (A) 10/08/2018 1541   HGBA1C >14.0 (A) 07/23/2018 1411   BUN 27 (H) 07/23/2018 1450   BUN 32 (H) 10/23/2017 1505   CREATININE 1.50 (H) 07/23/2018 1450   CREATININE 0.97 06/16/2015 1553   CALCIUM 9.1 07/23/2018 1450   GFRNONAA 35 (L) 07/23/2018 1450   GFRNONAA 63 06/16/2015 1553   GFRAA 41 (L) 07/23/2018 1450   GFRAA 72 06/16/2015 1553   WBC 5.4 12/25/2017 1426   HGB 10.3 (L) 12/25/2017 1426   HGB WILL FOLLOW 06/05/2017 1401   HCT 30.3 (L) 12/25/2017 1426   HCT WILL FOLLOW 06/05/2017 1401   PLT 324.0 12/25/2017 1426   PLT WILL FOLLOW 06/05/2017 1401   TSH 1.520 11/24/2015 1543   Ht Readings from Last 2 Encounters:  08/08/18 5' 1.5" (1.562 m)  03/02/18 5' 1.75" (1.568 m)   Wt Readings from Last 2 Encounters:  10/08/18 128 lb 1.6 oz (58.1 kg)  08/28/18 125 lb 12.8 oz (57.1 kg)   There is no height or weight on file to calculate BMI. BP Readings from Last 3 Encounters:  10/08/18 136/76  08/28/18 (!) 149/83  08/08/18 135/67    A/P: Patient presents for help with diabetes medications. She reports currently taking metformin 1000 mg BID, liraglutide 1.2 mg daily, insulin degludec 6 units daily, and insulin aspart 2 units TID with meals. No concerns or symptoms reported today. Home BG have been in the 100s, no longer experiencing hypoglycemia. Patient ran out of liraglutide 1 week ago, sample provided and re-titrated from 0.6 mg daily for tolerability.  Patient requested CGM service for closer DM management. Scheduled appointment for 11/22/18 and will notify PCP.  An after visit summary was provided and patient advised to follow up in 1 week or sooner if any changes in condition or questions regarding medications arise. The patient verbalized understanding of information provided by repeating back concepts discussed.

## 2018-11-13 MED FILL — LATANOPROST 0.005% EYE DRP: 0.005 | 25 days supply | Qty: 3 | Fill #2

## 2018-11-16 DIAGNOSIS — E113512 Type 2 diabetes mellitus with proliferative diabetic retinopathy with macular edema, left eye: Secondary | ICD-10-CM | POA: Diagnosis not present

## 2018-11-16 DIAGNOSIS — E113591 Type 2 diabetes mellitus with proliferative diabetic retinopathy without macular edema, right eye: Secondary | ICD-10-CM | POA: Diagnosis not present

## 2018-11-16 DIAGNOSIS — Z794 Long term (current) use of insulin: Secondary | ICD-10-CM | POA: Diagnosis not present

## 2018-11-16 DIAGNOSIS — H40053 Ocular hypertension, bilateral: Secondary | ICD-10-CM | POA: Diagnosis not present

## 2018-11-16 DIAGNOSIS — H35373 Puckering of macula, bilateral: Secondary | ICD-10-CM | POA: Diagnosis not present

## 2018-11-16 LAB — HM DIABETES EYE EXAM

## 2018-11-22 ENCOUNTER — Other Ambulatory Visit: Payer: Self-pay | Admitting: Hematology and Oncology

## 2018-11-22 ENCOUNTER — Ambulatory Visit: Payer: Medicare Other | Admitting: Pharmacist

## 2018-11-22 DIAGNOSIS — Z1231 Encounter for screening mammogram for malignant neoplasm of breast: Secondary | ICD-10-CM

## 2018-11-26 ENCOUNTER — Other Ambulatory Visit: Payer: Self-pay

## 2018-11-26 ENCOUNTER — Telehealth: Payer: Self-pay | Admitting: Internal Medicine

## 2018-11-26 ENCOUNTER — Ambulatory Visit (INDEPENDENT_AMBULATORY_CARE_PROVIDER_SITE_OTHER): Payer: Medicare Other | Admitting: Internal Medicine

## 2018-11-26 ENCOUNTER — Ambulatory Visit (HOSPITAL_COMMUNITY)
Admission: RE | Admit: 2018-11-26 | Discharge: 2018-11-26 | Disposition: A | Discharge status: Home / Self Care | Payer: Medicare Other | Source: Ambulatory Visit | Attending: Internal Medicine | Admitting: Internal Medicine

## 2018-11-26 VITALS — BP 127/69 | HR 93 | Temp 97.6°F | Ht 61.0 in | Wt 129.4 lb

## 2018-11-26 DIAGNOSIS — E119 Type 2 diabetes mellitus without complications: Secondary | ICD-10-CM | POA: Diagnosis not present

## 2018-11-26 DIAGNOSIS — Z794 Long term (current) use of insulin: Secondary | ICD-10-CM | POA: Diagnosis not present

## 2018-11-26 DIAGNOSIS — R55 Syncope and collapse: Secondary | ICD-10-CM

## 2018-11-26 DIAGNOSIS — R51 Headache: Secondary | ICD-10-CM

## 2018-11-26 DIAGNOSIS — R0602 Shortness of breath: Secondary | ICD-10-CM

## 2018-11-26 NOTE — Progress Notes (Signed)
   CC: Fall  HPI:  [name] is a 67 y.o. with a PMHx listed below presenting today for evaluation after a fall at church yesterday.   For details of today's visit and the status of his chronic medical issues please refer to the assessment and plan.   Past Medical History:  Diagnosis Date  . Allergy    SEASONAL  . Anemia   . Arthritis   . Cataract    PRESENT IN LEFT/ CATARACTS REMOVED FROM RIGHT  . Diabetes mellitus 1996  . DKA (diabetic ketoacidoses) ([place]) 01/02/2013  . Hyperlipidemia   . Hypertension   . Hypotension   . Influenza A (H1N1) 01/03/2013  . Neuromuscular disorder ([place])    NEUROPATHY  . SYNCOPE 05/10/2010   Qualifier: Diagnosis of  By: Marilynne Halsted, RN, BSN, Jacquelyn     Review of Systems:   Review of Systems  Eyes: Negative for blurred vision and double vision.  Respiratory: Positive for shortness of breath.   Cardiovascular: Negative for chest pain and palpitations.  Neurological: Positive for headaches. Negative for dizziness and weakness.     Physical Exam:  Vitals:   11/26/18 1116  BP: 127/69  Pulse: 93  Temp: 97.6 F (36.4 C)  TempSrc: Oral  SpO2: 100%  Weight: 129 lb 6.4 oz (58.7 kg)  Height: 5\' 1"  (1.549 m)   Physical Exam  Constitutional: She is oriented to person, place, and time and well-developed, well-nourished, and in no distress.  Eyes: EOM are normal.  Cardiovascular: Normal rate and normal heart sounds.  No murmur heard. Pulmonary/Chest: Effort normal and breath sounds normal. No respiratory distress. She has no wheezes.  Abdominal: Soft. Bowel sounds are normal. She exhibits no distension. There is no tenderness.  Neurological: She is alert and oriented to person, place, and time.  Skin: Skin is warm and dry.  Psychiatric: Mood, memory, affect and judgment normal.    Assessment & Plan:   See Encounters Tab for problem based charting.  Patient seen with Dr. Lynnae January

## 2018-11-26 NOTE — Assessment & Plan Note (Addendum)
Patient presented to the clinic today for evaluation after sustaining a fall at church yesterday.  She was sitting down looking for her keys in her bag and then she stood up and bent over to continue looking for her keys.  When she stood up straight she suddenly felt everything was blacking out and fell backwards onto her head.  She denied any prodromal symptoms of feeling lightheaded, dizzy, chest pain, palpitations, shortness of breath or headaches.  She denied losing consciousness.  She reported feeling a severe frontal and posterior headache after as well as soreness in her back and legs.  She also that she was sweating after this episode as well as having bilateral upper extremity shaking.  She said she checked her sugar in the morning it was 111 and she did not take her insulin prior to church. She said in the past she has been worked up for similar episodes her blood pressure would drop and she was told she was hypoglycemic at that time as well.  EKG done in office showed normal sinus rhythm.  Orthostatic vitals taking during this visit showed a BP of 139/73 when she was lying, 129/65 when she was sitting, and 86/49 when she was standing.  Patient complained that she was feeling dizzy and as if she was going to faint she was asked to stand.  Test was stopped at this point.  Patient educated on orthostatic hypotension and recommended lifestyle modifications at this point such as increased fluid intake, compression stockings, and making sure to avoid sudden positional changes.   Plan: -Lifestyle modifications such as increased fluid intake, compression stockings, etc. patient given information on orthostatic hypotension and changes that she can make to prevent further near syncopal episodes - Recommended Tylenol and heat therapy for her headache and back soreness

## 2018-11-26 NOTE — Patient Instructions (Addendum)
Amanda [name],  It was a pleasure meeting you today! I am sorry to hear about your fall at church. Based on your symptoms and your blood pressure that we checked as you went from laying to sitting to standing, your fall is most likely due to something called orthostatic hypotension. This is a sudden drop in your blood pressure from suddenly changing positions.   I would recommend some lifestyle modifications at this point to prevent any more falls. Try increasing your fluid intake. Also, try to avoid standing up suddenly. When you wake up sit up in bed for a few minutes before standing up and make sure there is something near by that can help support you.     Orthostatic Hypotension Orthostatic hypotension is a sudden drop in blood pressure that happens when you quickly change positions, such as when you get up from a seated or lying position. Blood pressure is a measurement of how strongly, or weakly, your blood is pressing against the walls of your arteries. Arteries are blood vessels that carry blood from your heart throughout your body. When blood pressure is too low, you may not get enough blood to your brain or to the rest of your organs. This can cause weakness, light-headedness, rapid heartbeat, and fainting. This can last for just a few seconds or for up to a few minutes. Orthostatic hypotension is usually not a serious problem. However, if it happens frequently or gets worse, it may be a sign of something more serious. What are the causes? This condition may be caused by:  Sudden changes in posture, such as standing up quickly after you have been sitting or lying down.  Blood loss.  Loss of body fluids (dehydration).  Heart problems.  Hormone (endocrine) problems.  Pregnancy.  Severe infection.  Lack of certain nutrients.  Severe allergic reactions (anaphylaxis).  Certain medicines, such as blood pressure medicine or medicines that make the body lose excess fluids (diuretics).  Sometimes, this condition can be caused by not taking medicine as directed, such as taking too much of a certain medicine.  What increases the risk? Certain factors can make you more likely to develop orthostatic hypotension, including:  Age. Risk increases as you get older.  Conditions that affect the heart or the central nervous system.  Taking certain medicines, such as blood pressure medicine or diuretics.  Being pregnant.  What are the signs or symptoms? Symptoms of this condition may include:  Weakness.  Light-headedness.  Dizziness.  Blurred vision.  Fatigue.  Rapid heartbeat.  Fainting, in severe cases.  How is this diagnosed? This condition is diagnosed based on:  Your medical history.  Your symptoms.  Your blood pressure measurement. Your health care provider will check your blood pressure when you are: ? Lying down. ? Sitting. ? Standing.  A blood pressure reading is recorded as two numbers, such as "120 over 80" (or 120/80). The first ("top") number is called the systolic pressure. It is a measure of the pressure in your arteries as your heart beats. The second ("bottom") number is called the diastolic pressure. It is a measure of the pressure in your arteries when your heart relaxes between beats. Blood pressure is measured in a unit called mm Hg. Healthy blood pressure for adults is 120/80. If your blood pressure is below 90/60, you may be diagnosed with hypotension. Other information or tests that may be used to diagnose orthostatic hypotension include:  Your other vital signs, such as your heart rate and  temperature.  Blood tests.  Tilt table test. For this test, you will be safely secured to a table that moves you from a lying position to an upright position. Your heart rhythm and blood pressure will be monitored during the test.  How is this treated? Treatment for this condition may include:  Changing your diet. This may involve eating more salt  (sodium) or drinking more water.  Taking medicines to raise your blood pressure.  Changing the dosage of certain medicines you are taking that might be lowering your blood pressure.  Wearing compression stockings. These stockings help to prevent blood clots and reduce swelling in your legs.  In some cases, you may need to go to the hospital for:  Fluid replacement. This means you will receive fluids through an IV tube.  Blood replacement. This means you will receive donated blood through an IV tube (transfusion).  Treating an infection or heart problems, if this applies.  Monitoring. You may need to be monitored while medicines that you are taking wear off.  Follow these instructions at home: Eating and drinking   Drink enough fluid to keep your urine clear or pale yellow.  Eat a healthy diet and follow instructions from your health care provider about eating or drinking restrictions. A healthy diet includes: ? Fresh fruits and vegetables. ? Whole grains. ? Lean meats. ? Low-fat dairy products.  Eat extra salt only as directed. Do not add extra salt to your diet unless your health care provider told you to do that.  Eat frequent, small meals.  Avoid standing up suddenly after eating. Medicines  Take over-the-counter and prescription medicines only as told by your health care provider. ? Follow instructions from your health care provider about changing the dosage of your current medicines, if this applies. ? Do not stop or adjust any of your medicines on your own. General instructions  Wear compression stockings as told by your health care provider.  Get up slowly from lying down or sitting positions. This gives your blood pressure a chance to adjust.  Avoid hot showers and excessive heat as directed by your health care provider.  Return to your normal activities as told by your health care provider. Ask your health care provider what activities are safe for you.  Do  not use any products that contain nicotine or tobacco, such as cigarettes and e-cigarettes. If you need help quitting, ask your health care provider.  Keep all follow-up visits as told by your health care provider. This is important. Contact a health care provider if:  You vomit.  You have diarrhea.  You have a fever for more than 2-3 days.  You feel more thirsty than usual.  You feel weak and tired. Get help right away if:  You have chest pain.  You have a fast or irregular heartbeat.  You develop numbness in any part of your body.  You cannot move your arms or your legs.  You have trouble speaking.  You become sweaty or feel lightheaded.  You faint.  You feel short of breath.  You have trouble staying awake.  You feel confused. This information is not intended to replace advice given to you by your health care provider. Make sure you discuss any questions you have with your health care provider. Document Released: 11/25/2002 Document Revised: 08/23/2016 Document Reviewed: 05/27/2016 Elsevier Interactive Patient Education  2018 [employer].

## 2018-11-27 ENCOUNTER — Other Ambulatory Visit: Payer: Self-pay | Admitting: Pharmacist

## 2018-11-27 ENCOUNTER — Ambulatory Visit: Payer: Medicare Other

## 2018-11-27 DIAGNOSIS — I10 Essential (primary) hypertension: Secondary | ICD-10-CM

## 2018-11-27 MED FILL — ATORVASTATIN 40 MG TABLET: 40 | 90 days supply | Qty: 90 | Fill #1

## 2018-11-27 MED FILL — metFORMIN HCL ER 500 MG TB2: 500 | 90 days supply | Qty: 360 | Fill #2

## 2018-11-27 NOTE — Progress Notes (Signed)
Internal Medicine Clinic Attending  I saw and evaluated the patient.  I personally confirmed the key portions of the history and exam documented by Dr. Laural Golden and I reviewed pertinent patient test results.  The assessment, diagnosis, and plan were formulated together and I agree with the documentation in the resident's note.   I personally viewed the EKG which showed sinus, nl axis, biphasic p in V1, and no ischemic changes.

## 2018-11-28 ENCOUNTER — Encounter: Payer: Self-pay | Admitting: *Deleted

## 2018-11-28 ENCOUNTER — Other Ambulatory Visit: Payer: Self-pay

## 2018-11-28 NOTE — Patient Outreach (Signed)
[name]) Care Management  11/28/2018  Amanda [name] August 03, 1951 [id number]   Medication Adherence call to [name] spoke with patient she is due on Atorvastatin 40 mg she pick this medication from [facility] yesterday. [name] is showing past due under [place].   [facility] Direct Dial [phone]  Fax [phone] [email or web address]

## 2018-11-29 ENCOUNTER — Other Ambulatory Visit: Payer: Self-pay | Admitting: *Deleted

## 2018-11-29 ENCOUNTER — Ambulatory Visit: Payer: Medicare Other | Admitting: Pharmacist

## 2018-11-29 DIAGNOSIS — Z794 Long term (current) use of insulin: Secondary | ICD-10-CM

## 2018-11-29 DIAGNOSIS — E1142 Type 2 diabetes mellitus with diabetic polyneuropathy: Secondary | ICD-10-CM

## 2018-11-29 DIAGNOSIS — I10 Essential (primary) hypertension: Secondary | ICD-10-CM

## 2018-11-29 MED ORDER — LISINOPRIL 40 MG PO TABS: 40.0000 mg | ORAL_TABLET | Freq: Every day | ORAL | 3 refills | Status: DC

## 2018-11-29 NOTE — Telephone Encounter (Signed)
Pt calls and states she will need her [facility]atin [facility] by Monday 12/16 at the latest

## 2018-11-30 MED ORDER — ATORVASTATIN CALCIUM 40 MG PO TABS: 40.0000 mg | ORAL_TABLET | Freq: Every day | ORAL | 3 refills | Status: DC

## 2018-11-30 NOTE — Progress Notes (Signed)
S: Amanda [name] is a 67 y.o. female reports to clinical pharmacist appointment for help with diabetes medication management per PCP.  Allergies  Allergen Reactions  . Gabapentin Other (See Comments)    Pt states medication made her feel "out of it".   Pt states medication made her feel "out of it".     Medication Sig  ACCU-CHEK SOFTCLIX LANCETS lancets Check blood sugar 4x/day before meal an bedtime  acetaminophen (TYLENOL) 325 MG tablet Take 2 tablets (650 mg total) by mouth every 6 (six) hours as needed (or Fever >/= 101).  aspirin EC 81 MG tablet Take 81 mg by mouth daily.  atorvastatin (LIPITOR) 40 MG tablet Take 1 tablet (40 mg total) by mouth daily.  benzonatate (TESSALON) 100 MG capsule Take 1 capsule (100 mg total) by mouth every 8 (eight) hours.  Capsaicin-Menthol-Methyl Sal (CAPSAICIN-METHYL SAL-MENTHOL) 0.025-1-12 % CREA Apply over painful areas  cetirizine (ZYRTEC) 5 MG tablet Take 1 tablet (5 mg total) by mouth daily.  Cholecalciferol (VITAMIN D3) 2000 units capsule Take by mouth.  Continuous Blood Gluc Receiver (FREESTYLE LIBRE 14 DAY READER) DEVI 1 each by Does not apply route 4 (four) times daily.  Continuous Blood Gluc Sensor (FREESTYLE LIBRE 14 DAY SENSOR) MISC 1 each by Does not apply route 4 (four) times daily.  cyclobenzaprine (FLEXERIL) 5 MG tablet Take 1 tablet (5 mg total) by mouth 2 (two) times daily as needed for muscle spasms.  dorzolamide-timolol (COSOPT) 22.3-6.8 MG/ML ophthalmic solution   DULoxetine (CYMBALTA) 30 MG capsule Please take 1 tablet daily for one week, then take 2 tablets daily (60mg ) indefinitely.  fluticasone (FLONASE) 50 MCG/ACT nasal spray Place 2 sprays into both nostrils daily.  glucose blood (ACCU-CHEK AVIVA PLUS) test strip Check blood sugar 4x/day before all meals and bedtime  insulin aspart (NOVOLOG FLEXPEN) 100 UNIT/ML FlexPen Inject 2 Units into the skin 3 (three) times daily with meals.  insulin degludec (TRESIBA FLEXTOUCH) 100  UNIT/ML SOPN FlexTouch Pen Inject 6 Units into the skin daily.  Insulin Pen Needle 33G X 5 MM MISC 1 applicator by Does not apply route daily. The patient is insulin requiring, ICD 10 code E11.65. The patient tests 5 times per day.  latanoprost (XALATAN) 0.005 % ophthalmic solution 1 drop at bedtime.  liraglutide (VICTOZA) 18 MG/3ML SOPN Inject 1.2 mg into the skin.  lisinopril (PRINIVIL,ZESTRIL) 40 MG tablet Take 1 tablet (40 mg total) by mouth daily.  metFORMIN (GLUCOPHAGE-XR) 500 MG 24 hr tablet Take 2 tablets (1,000 mg total) by mouth 2 (two) times daily.  Multiple Vitamins-Minerals (WOMENS 50+ MULTI VITAMIN/MIN) TABS Take 1 tablet by mouth daily.   Past Medical History:  Diagnosis Date  . Allergy    SEASONAL  . Anemia   . Arthritis   . Cataract    PRESENT IN LEFT/ CATARACTS REMOVED FROM RIGHT  . Diabetes mellitus 1996  . DKA (diabetic ketoacidoses) ([place]) 01/02/2013  . Hyperlipidemia   . Hypertension   . Hypotension   . Influenza A (H1N1) 01/03/2013  . Neuromuscular disorder ([place])    NEUROPATHY  . SYNCOPE 05/10/2010   Qualifier: Diagnosis of  By: Marilynne Halsted RN, BSN, Jacquelyn     Social History   Socioeconomic History  . Marital status: Married    Spouse name: Not on file  . Number of children: 1  . Years of education: Not on file  . Highest education level: Not on file  Occupational History  . 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 Food insecurity:    Worry: Not on file    Inability: Not on file  . Transportation needs:    Medical: Not on file    Non-medical: Not on file  Tobacco Use  . Smoking status: Never Smoker  . Smokeless tobacco: Never Used  Substance and Sexual Activity  . Alcohol use: No    Alcohol/week: 0.0 standard drinks  . Drug use: No  . Sexual activity: Not on file  Lifestyle  . Physical activity:    Days per week: Not on file    Minutes per session: Not on file  . Stress: Not on file  Relationships  .  Social connections:    Talks on phone: Not on file    Gets together: Not on file    Attends religious service: Not on file    Active member of club or organization: Not on file    Attends meetings of clubs or organizations: Not on file    Relationship status: Not on file  Other Topics Concern  . Not on file  Social History Narrative  . Not on file   Family History  Problem Relation Age of Onset  . Diabetes Mother   . Hypertension Mother   . Breast cancer Mother   . Aneurysm Father   . Diabetes Sister   . Hypertension Sister   . Kidney disease Sister        One Kidney transplant, one sister on HD  . Breast cancer Sister    O: Component Value Date/Time   CHOL 158 01/29/2018 1648   HDL 68 01/29/2018 1648   LDLCALC 80 01/29/2018 1648   TRIG 51 01/29/2018 1648   GLUCOSE 279 (H) 07/23/2018 1450   HGBA1C 7.9 (A) 10/08/2018 1541   HGBA1C >14.0 (A) 07/23/2018 1411   NA 137 07/23/2018 1450   NA 141 10/23/2017 1505   K 4.8 07/23/2018 1450   CL 104 07/23/2018 1450   CO2 25 07/23/2018 1450   BUN 27 (H) 07/23/2018 1450   BUN 32 (H) 10/23/2017 1505   CREATININE 1.50 (H) 07/23/2018 1450   CREATININE 0.97 06/16/2015 1553   CALCIUM 9.1 07/23/2018 1450   GFRNONAA 35 (L) 07/23/2018 1450   GFRNONAA 63 06/16/2015 1553   GFRAA 41 (L) 07/23/2018 1450   GFRAA 72 06/16/2015 1553   AST 11 12/25/2017 1426   ALT 7 12/25/2017 1426   WBC 5.4 12/25/2017 1426   HGB 10.3 (L) 12/25/2017 1426   HGB WILL FOLLOW 06/05/2017 1401   HCT 30.3 (L) 12/25/2017 1426   HCT WILL FOLLOW 06/05/2017 1401   PLT 324.0 12/25/2017 1426   PLT WILL FOLLOW 06/05/2017 1401   TSH 1.520 11/24/2015 1543   Ht Readings from Last 2 Encounters:  11/26/18 5\' 1"  (1.549 m)  08/08/18 5' 1.5" (1.562 m)   Wt Readings from Last 2 Encounters:  11/26/18 129 lb 6.4 oz (58.7 kg)  10/08/18 128 lb 1.6 oz (58.1 kg)   There is no height or weight on file to calculate BMI. BP Readings from Last 3 Encounters:  11/26/18 127/69   10/08/18 136/76  08/28/18 (!) 149/83    A/P:  Patient currently takes liraglutide 1.2 mg daily, insulin degludec 6 units daily, and insulin aspart 2 units TID with meals. She reports taking home BG once daily or every other day, states she had 1 result of 54 yesterday, and is curious if low BG is contributing to her falls. She states she  fell a few days ago and also has a history of falling. Otherwise, her BG are in the 100s.  Patient requested CGM, but would need to wait until after the holidays. For today, I instructed patient to stop the insulin degludec and to increase liraglutide to 1.8 mg daily. I advised her to try increasing frequency of SMBG to 2-3 times daily, call clinic if she experiences hypo or hyperglycemia or any medication side effects. Patient agrees to this plan.  An after visit summary was provided and patient advised to follow up in 3 weeks or sooner if any changes in condition or questions regarding medications arise.   The patient verbalized understanding of information provided by repeating back concepts discussed.

## 2018-12-04 ENCOUNTER — Ambulatory Visit (INDEPENDENT_AMBULATORY_CARE_PROVIDER_SITE_OTHER): Payer: Medicare Other | Admitting: Internal Medicine

## 2018-12-04 ENCOUNTER — Other Ambulatory Visit: Payer: Self-pay

## 2018-12-04 ENCOUNTER — Encounter: Payer: Self-pay | Admitting: Internal Medicine

## 2018-12-04 VITALS — Ht 61.0 in | Wt 126.4 lb

## 2018-12-04 DIAGNOSIS — I951 Orthostatic hypotension: Secondary | ICD-10-CM | POA: Diagnosis not present

## 2018-12-04 DIAGNOSIS — R011 Cardiac murmur, unspecified: Secondary | ICD-10-CM

## 2018-12-04 DIAGNOSIS — Z79899 Other long term (current) drug therapy: Secondary | ICD-10-CM

## 2018-12-04 DIAGNOSIS — Z9181 History of falling: Secondary | ICD-10-CM

## 2018-12-04 DIAGNOSIS — Z794 Long term (current) use of insulin: Secondary | ICD-10-CM

## 2018-12-04 DIAGNOSIS — R0981 Nasal congestion: Secondary | ICD-10-CM | POA: Diagnosis not present

## 2018-12-04 DIAGNOSIS — Z7982 Long term (current) use of aspirin: Secondary | ICD-10-CM

## 2018-12-04 MED ORDER — LISINOPRIL 20 MG PO TABS: 20.0000 mg | ORAL_TABLET | Freq: Every day | ORAL | 1 refills | Status: DC

## 2018-12-04 MED FILL — LISINOPRIL 20 MG TABLET: 20 | 30 days supply | Qty: 30 | Fill #0

## 2018-12-04 NOTE — Patient Instructions (Addendum)
Amanda [name]   It was a pleasure meeting you today.  Please follow up with your PCP in February.    Orthostatic Hypotension Orthostatic hypotension is a sudden drop in blood pressure that happens when you quickly change positions, such as when you get up from a seated or lying position. Blood pressure is a measurement of how strongly, or weakly, your blood is pressing against the walls of your arteries. Arteries are blood vessels that carry blood from your heart throughout your body. When blood pressure is too low, you may not get enough blood to your brain or to the rest of your organs. This can cause weakness, light-headedness, rapid heartbeat, and fainting. This can last for just a few seconds or for up to a few minutes. Orthostatic hypotension is usually not a serious problem. However, if it happens frequently or gets worse, it may be a sign of something more serious. What are the causes? This condition may be caused by:  Sudden changes in posture, such as standing up quickly after you have been sitting or lying down.  Blood loss.  Loss of body fluids (dehydration).  Heart problems.  Hormone (endocrine) problems.  Pregnancy.  Severe infection.  Lack of certain nutrients.  Severe allergic reactions (anaphylaxis).  Certain medicines, such as blood pressure medicine or medicines that make the body lose excess fluids (diuretics). Sometimes, this condition can be caused by not taking medicine as directed, such as taking too much of a certain medicine.  What increases the risk? Certain factors can make you more likely to develop orthostatic hypotension, including:  Age. Risk increases as you get older.  Conditions that affect the heart or the central nervous system.  Taking certain medicines, such as blood pressure medicine or diuretics.  Being pregnant.  What are the signs or symptoms? Symptoms of this condition may  include:  Weakness.  Light-headedness.  Dizziness.  Blurred vision.  Fatigue.  Rapid heartbeat.  Fainting, in severe cases.  How is this diagnosed? This condition is diagnosed based on:  Your medical history.  Your symptoms.  Your blood pressure measurement. Your health care provider will check your blood pressure when you are: ? Lying down. ? Sitting. ? Standing.  A blood pressure reading is recorded as two numbers, such as "120 over 80" (or 120/80). The first ("top") number is called the systolic pressure. It is a measure of the pressure in your arteries as your heart beats. The second ("bottom") number is called the diastolic pressure. It is a measure of the pressure in your arteries when your heart relaxes between beats. Blood pressure is measured in a unit called mm Hg. Healthy blood pressure for adults is 120/80. If your blood pressure is below 90/60, you may be diagnosed with hypotension. Other information or tests that may be used to diagnose orthostatic hypotension include:  Your other vital signs, such as your heart rate and temperature.  Blood tests.  Tilt table test. For this test, you will be safely secured to a table that moves you from a lying position to an upright position. Your heart rhythm and blood pressure will be monitored during the test.  How is this treated? Treatment for this condition may include:  Changing your diet. This may involve eating more salt (sodium) or drinking more water.  Taking medicines to raise your blood pressure.  Changing the dosage of certain medicines you are taking that might be lowering your blood pressure.  Wearing compression stockings. These stockings help to  prevent blood clots and reduce swelling in your legs.  In some cases, you may need to go to the hospital for:  Fluid replacement. This means you will receive fluids through an IV tube.  Blood replacement. This means you will receive donated blood through an  IV tube (transfusion).  Treating an infection or heart problems, if this applies.  Monitoring. You may need to be monitored while medicines that you are taking wear off.  Follow these instructions at home: Eating and drinking   Drink enough fluid to keep your urine clear or pale yellow.  Eat a healthy diet and follow instructions from your health care provider about eating or drinking restrictions. A healthy diet includes: ? Fresh fruits and vegetables. ? Whole grains. ? Lean meats. ? Low-fat dairy products.  Eat extra salt only as directed. Do not add extra salt to your diet unless your health care provider told you to do that.  Eat frequent, small meals.  Avoid standing up suddenly after eating. Medicines  Take over-the-counter and prescription medicines only as told by your health care provider. ? Follow instructions from your health care provider about changing the dosage of your current medicines, if this applies. ? Do not stop or adjust any of your medicines on your own. General instructions  Wear compression stockings as told by your health care provider.  Get up slowly from lying down or sitting positions. This gives your blood pressure a chance to adjust.  Avoid hot showers and excessive heat as directed by your health care provider.  Return to your normal activities as told by your health care provider. Ask your health care provider what activities are safe for you.  Do not use any products that contain nicotine or tobacco, such as cigarettes and e-cigarettes. If you need help quitting, ask your health care provider.  Keep all follow-up visits as told by your health care provider. This is important. Contact a health care provider if:  You vomit.  You have diarrhea.  You have a fever for more than 2-3 days.  You feel more thirsty than usual.  You feel weak and tired. Get help right away if:  You have chest pain.  You have a fast or irregular  heartbeat.  You develop numbness in any part of your body.  You cannot move your arms or your legs.  You have trouble speaking.  You become sweaty or feel lightheaded.  You faint.  You feel short of breath.  You have trouble staying awake.  You feel confused. This information is not intended to replace advice given to you by your health care provider. Make sure you discuss any questions you have with your health care provider. Document Released: 11/25/2002 Document Revised: 08/23/2016 Document Reviewed: 05/27/2016 Elsevier Interactive Patient Education  2018 [employer].

## 2018-12-04 NOTE — Assessment & Plan Note (Addendum)
  History of present illness: Patient states that she's had 3 falls in the last 3 weeks. Her last fall was when she was walking to her car. She states she felt lightheaded and then she fell. She states she did not lose consciousness or hit her head.  Her first fall was mechanical as she slipped on a slick surface.  The other fall was at church after standing from a seated position.    Assessment: Orthostatic hypotension Patient is positive for orthostatic hypotension in office today.  Her sitting blood pressure was 158/80 and on standing after 3 minutes was 99/59. She states she has tried to take it slow when getting up recently.  She also reports using compression stockings although does not have them on today.  She had her medications with her today. She sates she is currently taking lisinopril, metformin, aspirin, Victoza, atorvastatin and NovoLog.    At this time will cut lisinopril in half and have patient follow-up in one week. Will also obtain an echo for possible murmur heard on exam.  Discussed the importance of staying hydrated, using compression stockings and getting up slowly from a seated position and moving slowly when turning. Upon further review of the EMR this issue has been going on for at least 2 years and her blood pressure has been difficult to control in the setting of presyncopal episodes.  Patient may benefit from EP evaluation.  Plan -decrease lisinopril 40 mg to lisinopril 20 mg daily -Echo   -compression stockings - encouraged hydration - follow up 12/27 for repeat blood pressure  - May need EP evaluation, consider this referral on 12/27 visit

## 2018-12-04 NOTE — Progress Notes (Signed)
   CC: Fall  HPI:  Amanda [name] is a 67 y.o. female with history noted below that presents to the internal medicine clinic for fall.  Please see problem based charting from the status of patient's chronic medical conditions.  Past Medical History:  Diagnosis Date  . Allergy    SEASONAL  . Anemia   . Arthritis   . Cataract    PRESENT IN LEFT/ CATARACTS REMOVED FROM RIGHT  . Diabetes mellitus 1996  . DKA (diabetic ketoacidoses) ([facility]) 01/02/2013  . Hyperlipidemia   . Hypertension   . Hypotension   . Influenza A (H1N1) 01/03/2013  . Neuromuscular disorder ([place])    NEUROPATHY  . SYNCOPE 05/10/2010   Qualifier: Diagnosis of  By: Marilynne Halsted RN, BSN, Jacquelyn      Review of Systems:  Review of Systems  Constitutional: Negative for chills, fever and malaise/fatigue.  HENT: Positive for congestion.   Respiratory: Negative for shortness of breath.   Cardiovascular: Negative for chest pain, palpitations, orthopnea and leg swelling.  Gastrointestinal: Negative for abdominal pain, nausea and vomiting.  Musculoskeletal: Positive for falls.     Physical Exam:  Vitals:   12/04/18 0906  SpO2: 100%  Weight: 126 lb 6.4 oz (57.3 kg)  Height: 5\' 1"  (1.549 m)   Physical Exam  Constitutional: She is well-developed, well-nourished, and in no distress.  Cardiovascular: Normal rate and regular rhythm. Exam reveals no gallop and no friction rub.  Murmur heard. Pulmonary/Chest: Effort normal and breath sounds normal. No respiratory distress. She has no wheezes. She has no rales.  Musculoskeletal:        General: No edema.  Skin: Skin is warm and dry.     Assessment & Plan:   See encounters tab for problem based medical decision making.   Patient discussed with Dr. Angelia Mould

## 2018-12-07 NOTE — Progress Notes (Signed)
Internal Medicine Clinic Attending  Case discussed with Dr. Tandy Grawe at the time of the visit.  We reviewed the resident's history and exam and pertinent patient test results.  I agree with the assessment, diagnosis, and plan of care documented in the resident's note.  

## 2018-12-10 ENCOUNTER — Ambulatory Visit: Payer: Medicare Other | Admitting: Physician Assistant

## 2018-12-10 ENCOUNTER — Telehealth: Payer: Self-pay

## 2018-12-10 ENCOUNTER — Encounter: Payer: Self-pay | Admitting: Physician Assistant

## 2018-12-10 VITALS — BP 132/72 | HR 94 | Ht 61.5 in | Wt 126.2 lb

## 2018-12-10 DIAGNOSIS — R194 Change in bowel habit: Secondary | ICD-10-CM

## 2018-12-10 MED ORDER — PEG 3350-KCL-NA BICARB-NACL 420 G PO SOLR: 4000.0000 mL | Freq: Once | ORAL | 0 refills | Status: AC

## 2018-12-10 MED FILL — PEG-3350 SOLUTION: 420 | 1 days supply | Qty: 4000 | Fill #0

## 2018-12-10 NOTE — Patient Instructions (Signed)
If you are age 66 or older, your body mass index should be between 23-30. Your Body mass index is 23.47 kg/m. If this is out of the aforementioned range listed, please consider follow up with your Primary Care Provider.  If you are age 36 or younger, your body mass index should be between 19-25. Your Body mass index is 23.47 kg/m. If this is out of the aformentioned range listed, please consider follow up with your Primary Care Provider.   You have been scheduled for a colonoscopy. Please follow written instructions given to you at your visit today.  Please pick up your prep supplies at the pharmacy within the next 1-3 days. If you use inhalers (even only as needed), please bring them with you on the day of your procedure. Your physician has requested that you go to www.startemmi.com and enter the access code given to you at your visit today. This web site gives a general overview about your procedure. However, you should still follow specific instructions given to you by our office regarding your preparation for the procedure.  We have sent the following medications to your pharmacy for you to pick up at your convenience: Golytely  Start Fiber supplement daily. Citrucel causes less gas.  Drink 6-8 8 ounce glasses of water daily.  Thank you for choosing me and [facility].    Ellouise Newer, PA-C

## 2018-12-10 NOTE — Progress Notes (Deleted)
Hypertension Patient presents for follow-up of hypertension.  This was last evaluated in office visit 1 week ago.  At that time she described frequent falls which were attributed to orthostatic hypotension. At this visit she was taking lisinopril 40 mg daily and this was reduced to 20 mg daily. A new murmur was also noted on exam so an echo was ordered. *** Hx of cardiogenic syncope?  - *** lisinopril 20 mg daily  - Follow-up orthostatic blood pressure testing today - Continue to reinforce orthostatic precautions including wearing compression stockings

## 2018-12-10 NOTE — Progress Notes (Signed)
I agree with the above note, plan 

## 2018-12-10 NOTE — Telephone Encounter (Signed)
[facility] [address] [place], [place]  [address] Phone:  [phone]   Fax:  [phone]  December 10, 2018  Re:  Amanda [name] [address] [place] [address]   Dear Dr. Shan Levans,  This patient has been scheduled for a colonoscopy on 01/11/19.  The patient states that she is having an EKG performed the first part of January.  Please advise as to whether this patient should proceed with her colonoscopy.  If you have any questions or concerns, please don't hesitate to call.  Sincerely,    Levin Erp, [place]

## 2018-12-10 NOTE — Progress Notes (Signed)
Chief Complaint: Diarrhea alternating with constipation  Review of pertinent gastrointestinal problems: 1. Adenomatous colon polyps, colonoscopy June 2015 Dr. Ardis Hughs for chronic intermittent diarrhea found two subcentimeter adenomas. Recommended recall at 5 year interval. 2. Diverticulosis 3. Chronic intermittent diarrhea, colonoscopy June 2015 found normal terminal ileum, random biopsies of the colon showed no microscopic colitis.   Fiber seemed to help in 2015 4. Abdominal pain, mild unintentional weight loss workup 12/2017: labs 1/2019cmet normal, Hb 10.3 (relatively stable vs previous 4 years).  CT scan with IV and po contrast 12/2017: unrevealing except large stool burden   HPI:    Amanda [name] is a 67 year old African-American female, known to Dr. Ardis Hughs, who presents to clinic today for alternating diarrhea with constipation.    03/02/2018 office visit with Dr. Ardis Hughs.  At that time hemoglobin A1c was noted to be 10.7.  At that time her diarrhea episodes had significantly improved since cutting back on her fiber supplements.  She was down to 2 episodes per month.  It was discussed that her out-of-control blood sugars may contribute to her mid intermittent loose stools.  Also discussed that if her diarrhea returns then she could have a sooner colonoscopy.    10/08/2018 hemoglobin A1c 7.9.,  Previously 9.17 months ago.    Today, presents to clinic and explains that she was doing fairly well describing being "more normal than before" as far as her bowel movements but over the past couple of weeks has had a change again.  Describes 2 to 3 days of no bowel movement and then "being rushed to the bathroom" with at least 5-6 episodes of loose/barely formed stool.  Also describes when she is sitting she feels somewhat uncomfortable and is now experiencing abdominal discomfort prior to bowel movements, mostly relieved afterwards.  Patient just "feels like there is something going on".  Denies any new  medications or weight loss.    Denies fever, chills, anorexia, nausea, vomiting, heartburn, reflux, blood in her stool or symptoms that awaken her from sleep.  Past Medical History:  Diagnosis Date  . Allergy    SEASONAL  . Anemia   . Arthritis   . Cataract    PRESENT IN LEFT/ CATARACTS REMOVED FROM RIGHT  . Diabetes mellitus 1996  . DKA (diabetic ketoacidoses) ([facility]) 01/02/2013  . Hyperlipidemia   . Hypertension   . Hypotension   . Influenza A (H1N1) 01/03/2013  . Neuromuscular disorder ([facility])    NEUROPATHY  . SYNCOPE 05/10/2010   Qualifier: Diagnosis of  By: Marilynne Halsted RN, BSN, Jacquelyn      Past Surgical History:  Procedure Laterality Date  . ABDOMINAL HYSTERECTOMY     10 years ago   . BREAST BIOPSY    . Cataracts    . ROTATOR CUFF REPAIR    . SHOULDER SURGERY     Rotator cuff tear     Current Outpatient Medications  Medication Sig Dispense Refill  . ACCU-CHEK SOFTCLIX LANCETS lancets Check blood sugar 4x/day before meal an bedtime 200 each 7  . acetaminophen (TYLENOL) 325 MG tablet Take 2 tablets (650 mg total) by mouth every 6 (six) hours as needed (or Fever >/= 101). 60 tablet 0  . aspirin EC 81 MG tablet Take 81 mg by mouth daily.    [name] atorvastatin (LIPITOR) 40 MG tablet Take 1 tablet (40 mg total) by mouth daily. 90 tablet 3  . benzonatate (TESSALON) 100 MG capsule Take 1 capsule (100 mg total) by mouth every 8 (eight) hours. (Patient  taking differently: Take 100 mg by mouth every 8 (eight) hours as needed. ) 21 capsule 0  . Capsaicin-Menthol-Methyl Sal (CAPSAICIN-METHYL SAL-MENTHOL) 0.025-1-12 % CREA Apply over painful areas (Patient taking differently: as needed. Apply over painful areas) 1 Tube 3  . cetirizine (ZYRTEC) 5 MG tablet Take 1 tablet (5 mg total) by mouth daily. 30 tablet 1  . Cholecalciferol (VITAMIN D3) 2000 units capsule Take by mouth daily.     . Continuous Blood Gluc Sensor (FREESTYLE LIBRE 14 DAY SENSOR) MISC 1 each by Does not apply route 4 (four)  times daily. 2 each 12  . cyclobenzaprine (FLEXERIL) 5 MG tablet Take 1 tablet (5 mg total) by mouth 2 (two) times daily as needed for muscle spasms. 30 tablet 0  . fluticasone (FLONASE) 50 MCG/ACT nasal spray Place 2 sprays into both nostrils daily. 16 g 2  . insulin aspart (NOVOLOG FLEXPEN) 100 UNIT/ML FlexPen Inject 2 Units into the skin 3 (three) times daily with meals.    . Insulin Pen Needle 33G X 5 MM MISC 1 applicator by Does not apply route daily. The patient is insulin requiring, ICD 10 code E11.65. The patient tests 5 times per day. 100 each 3  . latanoprost (XALATAN) 0.005 % ophthalmic solution 1 drop at bedtime.    . liraglutide (VICTOZA) 18 MG/3ML SOPN Inject 1.8 mg into the skin.     [name] lisinopril (PRINIVIL,ZESTRIL) 20 MG tablet Take 1 tablet (20 mg total) by mouth daily. 30 tablet 1  . metFORMIN (GLUCOPHAGE-XR) 500 MG 24 hr tablet Take 2 tablets (1,000 mg total) by mouth 2 (two) times daily. 360 tablet 2  . Multiple Vitamins-Minerals (WOMENS 50+ MULTI VITAMIN/MIN) TABS Take 1 tablet by mouth daily. 30 tablet   . dorzolamide-timolol (COSOPT) 22.3-6.8 MG/ML ophthalmic solution     . DULoxetine (CYMBALTA) 30 MG capsule Please take 1 tablet daily for one week, then take 2 tablets daily (60mg ) indefinitely. (Patient not taking: Reported on 12/10/2018) 180 capsule 2  . glucose blood (ACCU-CHEK AVIVA PLUS) test strip Check blood sugar 4x/day before all meals and bedtime 125 each 12  . insulin degludec (TRESIBA FLEXTOUCH) 100 UNIT/ML SOPN FlexTouch Pen Inject 6 Units into the skin daily.     No current facility-administered medications for this visit.     Allergies as of 12/10/2018 - Review Complete 12/10/2018  Allergen Reaction Noted  . Gabapentin Other (See Comments) 01/17/2014    Family History  Problem Relation Age of Onset  . Diabetes Mother   . Hypertension Mother   . Breast cancer Mother   . Aneurysm Father   . Diabetes Sister   . Hypertension Sister   . Kidney disease  Sister        One Kidney transplant, one sister on HD  . Breast cancer Sister   . Colon cancer Neg Hx   . Esophageal cancer Neg Hx     Social History   Socioeconomic History  . Marital status: Married    Spouse name: Not on file  . Number of children: 1  . Years of education: Not on file  . Highest education level: Not on file  Occupational History  . Occupation: retired  Scientific laboratory technician  . Financial resource strain: Not on file  . Food insecurity:    Worry: Not on file    Inability: Not on file  . Transportation needs:    Medical: Not on file    Non-medical: Not on file  Tobacco Use  . Smoking  status: Never Smoker  . Smokeless tobacco: Never Used  Substance and Sexual Activity  . Alcohol use: No    Alcohol/week: 0.0 standard drinks  . Drug use: No  . Sexual activity: Not on file  Lifestyle  . Physical activity:    Days per week: Not on file    Minutes per session: Not on file  . Stress: Not on file  Relationships  . Social connections:    Talks on phone: Not on file    Gets together: Not on file    Attends religious service: Not on file    Active member of club or organization: Not on file    Attends meetings of clubs or organizations: Not on file    Relationship status: Not on file  . Intimate partner violence:    Fear of current or ex partner: Not on file    Emotionally abused: Not on file    Physically abused: Not on file    Forced sexual activity: Not on file  Other Topics Concern  . Not on file  Social History Narrative  . Not on file    Review of Systems:    Constitutional: No weight loss, fever or chills Cardiovascular: No chest pain Respiratory: No SOB  Gastrointestinal: See HPI and otherwise negative   Physical Exam:  Vital signs: BP 132/72   Pulse 94   Ht 5' 1.5" (1.562 m)   Wt 126 lb 4 oz (57.3 kg)   BMI 23.47 kg/m   Constitutional:   Pleasant AA female appears to be in NAD, Well developed, Well nourished, alert and  cooperative Respiratory: Respirations even and unlabored. Lungs clear to auscultation bilaterally.   No wheezes, crackles, or rhonchi.  Cardiovascular: Normal S1, S2. No MRG. Regular rate and rhythm. No peripheral edema, cyanosis or pallor.  Gastrointestinal:  Soft, nondistended, nontender. No rebound or guarding. Normal bowel sounds. No appreciable masses or hepatomegaly. Rectal:  Not performed.  Psychiatric: Demonstrates good judgement and reason without abnormal affect or behaviors.  No recent labs or imaging.  Assessment: 1.  Change in bowel habits: Patient was doing fairly well until about 2 to 3 weeks ago when she had an increase in loose stools "rushing to the bathroom" at least 5-6 times a day, patient has now become somewhat nervous in regards to this as it has been going off and on over the past year, hemoglobin A1c is still greater than 7; consider IBS versus diabetic colopathy versus other  Plan: 1.  Discussed with patient that her diabetes may be playing a role in her diarrhea. 2.  Encouraged the patient to restart her fiber, 25-35 g/day with use of fiber segment such as Citrucel which is less gas causing. 3.  Recommend the patient increase her water intake to the 6-8 eight ounce glasses of water per day. 4.  Patient was scheduled for a diagnostic colonoscopy in the [facility] with Dr. Ardis Hughs due to a change in bowel habits.  Did discuss risks, benefits, limitations and alternatives and the patient agrees to proceed. 5.  Patient to follow in clinic per recommendations from Dr. Ardis Hughs after time of procedure.  Ellouise Newer, PA-C [facility] 12/10/2018, 9:51 AM  Cc: [name], MD

## 2018-12-14 ENCOUNTER — Ambulatory Visit: Payer: Medicare Other

## 2018-12-20 ENCOUNTER — Ambulatory Visit: Payer: Medicare Other

## 2018-12-21 ENCOUNTER — Ambulatory Visit (INDEPENDENT_AMBULATORY_CARE_PROVIDER_SITE_OTHER): Payer: Medicare Other | Admitting: Dietician

## 2018-12-21 ENCOUNTER — Ambulatory Visit (INDEPENDENT_AMBULATORY_CARE_PROVIDER_SITE_OTHER): Payer: Medicare Other | Admitting: Internal Medicine

## 2018-12-21 ENCOUNTER — Ambulatory Visit (HOSPITAL_COMMUNITY)
Admission: RE | Admit: 2018-12-21 | Discharge: 2018-12-21 | Disposition: A | Discharge status: Home / Self Care | Payer: Medicare Other | Source: Ambulatory Visit | Attending: Internal Medicine | Admitting: Internal Medicine

## 2018-12-21 ENCOUNTER — Other Ambulatory Visit: Payer: Self-pay | Admitting: Dietician

## 2018-12-21 ENCOUNTER — Encounter: Payer: Self-pay | Admitting: Dietician

## 2018-12-21 ENCOUNTER — Other Ambulatory Visit: Payer: Self-pay

## 2018-12-21 ENCOUNTER — Encounter: Payer: Self-pay | Admitting: Internal Medicine

## 2018-12-21 VITALS — Temp 97.7°F | Ht 61.0 in | Wt 129.1 lb

## 2018-12-21 DIAGNOSIS — I951 Orthostatic hypotension: Secondary | ICD-10-CM | POA: Insufficient documentation

## 2018-12-21 DIAGNOSIS — E1140 Type 2 diabetes mellitus with diabetic neuropathy, unspecified: Secondary | ICD-10-CM | POA: Diagnosis not present

## 2018-12-21 DIAGNOSIS — R55 Syncope and collapse: Secondary | ICD-10-CM | POA: Diagnosis not present

## 2018-12-21 DIAGNOSIS — E1142 Type 2 diabetes mellitus with diabetic polyneuropathy: Secondary | ICD-10-CM

## 2018-12-21 DIAGNOSIS — F329 Major depressive disorder, single episode, unspecified: Secondary | ICD-10-CM

## 2018-12-21 DIAGNOSIS — I10 Essential (primary) hypertension: Secondary | ICD-10-CM

## 2018-12-21 DIAGNOSIS — Z794 Long term (current) use of insulin: Secondary | ICD-10-CM

## 2018-12-21 DIAGNOSIS — R197 Diarrhea, unspecified: Secondary | ICD-10-CM | POA: Diagnosis not present

## 2018-12-21 DIAGNOSIS — E118 Type 2 diabetes mellitus with unspecified complications: Secondary | ICD-10-CM

## 2018-12-21 DIAGNOSIS — R05 Cough: Secondary | ICD-10-CM

## 2018-12-21 DIAGNOSIS — Z79899 Other long term (current) drug therapy: Secondary | ICD-10-CM

## 2018-12-21 DIAGNOSIS — R269 Unspecified abnormalities of gait and mobility: Secondary | ICD-10-CM

## 2018-12-21 LAB — ECHOCARDIOGRAM COMPLETE
Height: 61 in
Weight: 2065.6 oz

## 2018-12-21 NOTE — Progress Notes (Signed)
I agree will sign

## 2018-12-21 NOTE — Progress Notes (Signed)
.     CC: BP/orthostatic BP follow up   HPI:  [name] is a 68 y.o. with past medical history as documented below, came to the clinic today for follow-up. Patient seen in December 17 for work-up of frequent fall and lightheadedness and found to have orthostatic hypotension,  Lisinopril decreased from 40 to 20 mg QD. And planned to do echocardiogram. She is here today for follow up and BP recheck. Please see problem based charting charting for further details and assessment and plan.  Past Medical History:  Diagnosis Date  . Allergy    SEASONAL  . Anemia   . Arthritis   . Cataract    PRESENT IN LEFT/ CATARACTS REMOVED FROM RIGHT  . Diabetes mellitus 1996  . DKA (diabetic ketoacidoses) ([facility]) 01/02/2013  . Hyperlipidemia   . Hypertension   . Hypotension   . Influenza A (H1N1) 01/03/2013  . Neuromuscular disorder ([facility])    NEUROPATHY  . SYNCOPE 05/10/2010   Qualifier: Diagnosis of  By: Marilynne Halsted RN, BSN, Jacquelyn     Family Hx:  HTN          Mother DM            Mother Breast cancer    Mother  Social Hx: Non smoker No EtoH use No Illicit drug use   Review of Systems:  Review of Systems  Constitutional: Negative for chills and fever.  Respiratory: Positive for cough.   Gastrointestinal: Positive for diarrhea. Negative for abdominal pain, nausea and vomiting.  Genitourinary: Negative for dysuria, frequency, hematuria and urgency.  Neurological: Positive for dizziness and headaches.  Psychiatric/Behavioral: Positive for depression.   Physical Exam:  There were no vitals filed for this visit. Physical Exam  Constitutional: She is oriented to person, place, and time and well-developed, well-nourished, and in no distress. No distress.  HENT:  Head: Normocephalic and atraumatic.  Eyes: Pupils are equal, round, and reactive to light. EOM are normal.  Cardiovascular: Normal rate, regular rhythm and normal heart sounds.  No murmur heard. Pulmonary/Chest: Effort normal and  breath sounds normal. No respiratory distress. She has no wheezes. She has no rales.  Abdominal: Soft. Bowel sounds are normal.  Musculoskeletal:        General: No tenderness or edema.  Neurological: She is alert and oriented to person, place, and time.  Psychiatric: Affect and judgment normal.    Assessment & Plan:   See Encounters Tab for problem based charting.  Patient seen with Dr. Lynnae January

## 2018-12-21 NOTE — Assessment & Plan Note (Addendum)
Denies any further fall/ Lisinopril decreased from 40 to 20 mg on last clinic visit. she still has some dizziness and orthostatic hypotension today. Reports that her blood pressure at home has been around 140s over 80s after decreasing lisinopril.  Orthostatic hypotension today (192/82 standing and 111/73 standing after 3 minutes)  Patient also has Hx of intermittent diarrhea that can play a role in her symptoms. last area was 2 days ago. She follows up with GI and has a colonoscopy scheduled in few weeks. Will also check CBC and Ferritin. In addition to B12 (due to possibility of malabsorption in setting of diarrhea)  We will hold lisinopril for now due to risk of fall and plan to monitor BP and orthostatic BP at home and follow up in clinic in 2-3 weeks or earlier if symptomatic or significantly elevated blood pressure.   -Hold lisinopril -Ambulatory referral to PT for orthostatic occasion and checking orthostatic blood pressure at home -Follow up in clinic in 2-3 weeks or earlier as needed Has Echo scheduled for today.  -F/U Echo -BMP -CBC -Ferritin -B12

## 2018-12-21 NOTE — Assessment & Plan Note (Signed)
Patient with history of dizziness, fall and near syncopal episode, had orthostatic hypotension on last visit.  Lisinopril decreased from 40 to 20 mg on last clinic visit. Reports that her blood pressure at home has been around 140s over 80s after decreasing lisinopril No further fall since then but she still has some dizziness and orthostatic hypotension today.   We will hold lisinopril for now due to risk of fall and plan to monitor BP and orthostatic BP at home and follow up in clinic in 2-3 weeks or earlier if symptomatic or significantly elevated blood pressure.  -Hold lisinopril -Ambulatory referral to PT for orthostatic occasion and checking orthostatic blood pressure at home -Follow up in clinic in 2-3 weeks or earlier as needed

## 2018-12-21 NOTE — Progress Notes (Signed)
Annual DSMT & MNT referral request. Please sign the referral in this note if you agree. Thank you!

## 2018-12-21 NOTE — Assessment & Plan Note (Addendum)
Patient with Hx of dizziness and near syncope, Reports that lowest BG at home has been around 70s.   -BMP -CGM -Continue current DM regimen

## 2018-12-21 NOTE — Patient Instructions (Addendum)
Thank you for allowing [place] to provide your care today. Today we discussed your blood pressure and dizziness. I am glad that you did not have any further falling. How ever your positional blood pressure is still low. As we discussed, I hold your blood pressure medicine and refer you to physical therapy for some education as well as checking positional blood pressure at home. I also start the process for home blood sugar monitoring today (CGM). Today you have scheduled for Echo (heart ultrasound) we will follow up the result and I will call if any are abnormal.   We will do some blood work today and will call you with result. Please follow-up in 2-3 weeks in our clinic.  Should you have any questions or concerns please call the internal medicine clinic at [phone].   Thank you

## 2018-12-21 NOTE — Patient Instructions (Signed)
Please record the time, amount and what food drinks and activities you have while wearing the continuous glucose monitor(CGM).  Bring the folder with you to follow up appointments  Do not have a CT or an MRI while wearing the CGM.   Please make an appointment for 1 week with me and a doctor for the first of two CGM downloads..   You will also return in 2 weeks to have your second download and the CGM remove  Donna [phone] 

## 2018-12-21 NOTE — Assessment & Plan Note (Signed)
We continue work up (please see orthostatic hypotension under problem list) -Ambulatory referral to PT for orthostatic education

## 2018-12-21 NOTE — Progress Notes (Addendum)
  Medical Nutrition Therapy:  Appt start time: 1030 end time:  1050. Visit # 2  Assessment:  Primary concerns today: CGM per Dr. Myrtie Hawk. Ms. Pitney presents for CGM placement today. She says  "I think I need that other insulin". We discussed her weight, blood sugars and symptoms (which she has none) are a good indication that she needs more or different insulin. 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  DM diagnosis: 1994 with history of DKA Preferred Learning Style:No preference indicated  Learning Readiness: Ready ANTHROPOMETRICS: no change reproted MEDICATIONS: she reports BLOOD SUGAR:she did not bring her meter today nor has she checked her blood sugar today, she says that some readings over the holidays were in the high 200s and she thinks her fasting blood sugar was ~ 140 yesterday.  DIETARY INTAKE: eating normally, note in chart that per GI her bowels are alternating between constipation and diarrhea. Gi mentions diabetic gastropathy. Usual physical activity: no changes in activities of daily living  Progress Towards Goal(s):  In progress.   Nutritional Diagnosis:  NB-1.1 Food and nutrition-related knowledge deficit As related to  lack of sufficient training to build self confidence in diabetes self care  As evidenced by her report.    Intervention:  Nutrition education  Action Goal:keep food, mealtime insulin and activity records   Outcome goal: improved blood sugars Coordination of care: work with Dr. Maudie Mercury and Shan Levans to assist Ms. Owens Shark with both gastrointestinal symptoms and meal planning for blood sugar control ( which often also helps GI symptoms)   Teaching Method Utilized: Visual, Auditory,Hands on Handouts given during visit include:cgm and  After visit summary Barriers to learning/adherence to lifestyle change: competing values Demonstrated degree of understanding via:  Teach Back   Monitoring/Evaluation:  Dietary intake, exercise, meter and cgm, and  body weight in 1 week(s). [name], RD 12/21/2018 11:49 AM.

## 2018-12-21 NOTE — Assessment & Plan Note (Signed)
Patient has stopped Cymbalta due to side effect (Mentions that did not feel good with it). Mentions that her symptoms are tolerable without that. Having dizziness and near syncope, we avoid to restart or giving new medications for now.

## 2018-12-21 NOTE — Progress Notes (Addendum)
Documentation for Freestyle Libre Pro Continuous glucose monitoring Time for putting the sensor on and educating was 15 minutes Freestyle Libre Pro CGM sensor placed today. Patient was educated about wearing sensor, keeping food, activity and medication log and when to call office. Patient was educated about how to care for the sensor and not to have an MRI, CT or Diathermy while wearing the sensor. Follow up was arranged with the patient for 1 week.   Lot #: [id number] Serial #: [id number] Expiration Date: 04/18/2019  [name], RD 12/21/2018 11:56 AM.

## 2018-12-22 LAB — CBC
Hematocrit: 30.3 % — ABNORMAL LOW (ref 34.0–46.6)
Hemoglobin: 10 g/dL — ABNORMAL LOW (ref 11.1–15.9)
MCH: 29.5 pg (ref 26.6–33.0)
MCHC: 33 g/dL (ref 31.5–35.7)
MCV: 89 fL (ref 79–97)
Platelets: 358 10*3/uL (ref 150–450)
RBC: 3.39 x10E6/uL — ABNORMAL LOW (ref 3.77–5.28)
RDW: 11.7 % — ABNORMAL LOW (ref 12.3–15.4)
WBC: 5.7 10*3/uL (ref 3.4–10.8)

## 2018-12-22 LAB — BMP8+ANION GAP
Anion Gap: 13 mmol/L (ref 10.0–18.0)
BUN/Creatinine Ratio: 24 (ref 12–28)
BUN: 26 mg/dL (ref 8–27)
CO2: 21 mmol/L (ref 20–29)
Calcium: 9.2 mg/dL (ref 8.7–10.3)
Chloride: 109 mmol/L — ABNORMAL HIGH (ref 96–106)
Creatinine, Ser: 1.07 mg/dL — ABNORMAL HIGH (ref 0.57–1.00)
GFR calc Af Amer: 63 mL/min/{1.73_m2} (ref >59)
GFR calc non Af Amer: 54 mL/min/{1.73_m2} — ABNORMAL LOW (ref >59)
Glucose: 110 mg/dL — ABNORMAL HIGH (ref 65–99)
Potassium: 4.6 mmol/L (ref 3.5–5.2)
Sodium: 143 mmol/L (ref 134–144)

## 2018-12-22 LAB — FERRITIN: Ferritin: 70 ng/mL (ref 15–150)

## 2018-12-22 LAB — VITAMIN B12: Vitamin B-12: 713 pg/mL (ref 232–1245)

## 2018-12-25 NOTE — Progress Notes (Signed)
Internal Medicine Clinic Attending  I saw and evaluated the patient.  I personally confirmed the key portions of the history and exam documented by Dr. Masoudi and I reviewed pertinent patient test results.  The assessment, diagnosis, and plan were formulated together and I agree with the documentation in the resident's note. 

## 2018-12-26 ENCOUNTER — Other Ambulatory Visit: Payer: Self-pay

## 2018-12-26 ENCOUNTER — Ambulatory Visit (INDEPENDENT_AMBULATORY_CARE_PROVIDER_SITE_OTHER): Payer: Medicare Other | Admitting: Internal Medicine

## 2018-12-26 ENCOUNTER — Ambulatory Visit: Payer: Medicare Other | Admitting: Pharmacist

## 2018-12-26 ENCOUNTER — Encounter: Payer: Self-pay | Admitting: Internal Medicine

## 2018-12-26 VITALS — BP 128/70 | HR 91 | Temp 97.9°F | Ht 61.75 in | Wt 126.4 lb

## 2018-12-26 DIAGNOSIS — E785 Hyperlipidemia, unspecified: Secondary | ICD-10-CM

## 2018-12-26 DIAGNOSIS — Z794 Long term (current) use of insulin: Principal | ICD-10-CM

## 2018-12-26 DIAGNOSIS — I951 Orthostatic hypotension: Secondary | ICD-10-CM

## 2018-12-26 DIAGNOSIS — E1136 Type 2 diabetes mellitus with diabetic cataract: Secondary | ICD-10-CM | POA: Diagnosis not present

## 2018-12-26 DIAGNOSIS — E1142 Type 2 diabetes mellitus with diabetic polyneuropathy: Secondary | ICD-10-CM | POA: Diagnosis not present

## 2018-12-26 DIAGNOSIS — E118 Type 2 diabetes mellitus with unspecified complications: Secondary | ICD-10-CM

## 2018-12-26 DIAGNOSIS — Z79899 Other long term (current) drug therapy: Secondary | ICD-10-CM

## 2018-12-26 DIAGNOSIS — H269 Unspecified cataract: Secondary | ICD-10-CM

## 2018-12-26 MED ORDER — LIRAGLUTIDE 18 MG/3ML ~~LOC~~ SOPN: 1.8000 mg | PEN_INJECTOR | Freq: Every day | SUBCUTANEOUS | 11 refills | Status: DC

## 2018-12-26 MED ORDER — CAPSAICIN-MENTHOL-METHYL SAL 0.025-1-12 % EX CREA: 1.0000 "application " | TOPICAL_CREAM | CUTANEOUS | 1 refills | Status: DC | PRN

## 2018-12-26 NOTE — Assessment & Plan Note (Addendum)
Amanda [name] wore the CGM for 6 days. The average reading was 100, % time in target was 78, % time below target was 18, and % time above target was. 4. Intervention will be to STOP novolog 2 units TID qc. The patient will be scheduled to see in 7 days (01/02/19) for a final appointment.   Amanda [name] returns for T2DM management after CGM placed last week. She states she has had no issues with the monitor itself. Continuing to use liraglutide 1.8mg  and novolog 2 units TID qc. She mentions prior history of difficulty affording her insulin and needing to ask for samples as a result. She states she has not had any hypoglycemic symptoms such as cold sweats, tremors, syncopal episodes. She mentions hyperglycemic episodes in line with meals heavy in white bread. Discussed importance of reducing simple carbs in her diet. Chart review shows no frequent DKA events. She is amenable to discontinuing her insulin all together.   - STOP novolog 2 units TID qc - C/w metformin 1000mg  BID, liraglutide 1.8mg  subq daily - F/u in 1 week

## 2018-12-26 NOTE — Assessment & Plan Note (Addendum)
Presenting with questions about prior work up for syncopal episodes. States she has not had any further syncopal episodes since last week after stopping lisinopril. Bp at visit today at goal 128/60. Has not had chance to follow up with PT for orthostatic hypotension education. Echo performed shows 55-60% EF w grade 1 diastolic dysfunction without significant valvular abnormalities. Discussed and explained Echo findings with her and she expressed understanding about current plan to f/u with PT.  - F/u with PT

## 2018-12-26 NOTE — Progress Notes (Signed)
S: Amanda [name] is a 68 y.o. female reports to clinical pharmacist appointment for help with diabetes management  Allergies  Allergen Reactions  . Gabapentin Other (See Comments)    Pt states medication made her feel "out of it".   Pt states medication made her feel "out of it".     Medication Sig  ACCU-CHEK SOFTCLIX LANCETS lancets Check blood sugar 4x/day before meal an bedtime  acetaminophen (TYLENOL) 325 MG tablet Take 2 tablets (650 mg total) by mouth every 6 (six) hours as needed (or Fever >/= 101).  aspirin EC 81 MG tablet Take 81 mg by mouth daily.  atorvastatin (LIPITOR) 40 MG tablet Take 1 tablet (40 mg total) by mouth daily.  benzonatate (TESSALON) 100 MG capsule Take 1 capsule (100 mg total) by mouth every 8 (eight) hours. Patient taking differently: Take 100 mg by mouth every 8 (eight) hours as needed.   Capsaicin-Menthol-Methyl Sal (CAPSAICIN-METHYL SAL-MENTHOL) 0.025-1-12 % CREA Apply 1 application topically as needed. Apply over painful areas  cetirizine (ZYRTEC) 5 MG tablet Take 1 tablet (5 mg total) by mouth daily.  Cholecalciferol (VITAMIN D3) 2000 units capsule Take by mouth daily.   Continuous Blood Gluc Sensor (FREESTYLE LIBRE 14 DAY SENSOR) MISC 1 each by Does not apply route 4 (four) times daily.  fluticasone (FLONASE) 50 MCG/ACT nasal spray Place 2 sprays into both nostrils daily.  glucose blood (ACCU-CHEK AVIVA PLUS) test strip Check blood sugar 4x/day before all meals and bedtime  Insulin Pen Needle 33G X 5 MM MISC 1 applicator by Does not apply route daily. The patient is insulin requiring, ICD 10 code E11.65. The patient tests 5 times per day.  latanoprost (XALATAN) 0.005 % ophthalmic solution 1 drop at bedtime.  liraglutide (VICTOZA) 18 MG/3ML SOPN Inject 1.8 mg into the skin.   metFORMIN (GLUCOPHAGE-XR) 500 MG 24 hr tablet Take 2 tablets (1,000 mg total) by mouth 2 (two) times daily.  Multiple Vitamins-Minerals (WOMENS 50+ MULTI VITAMIN/MIN) TABS Take 1  tablet by mouth daily.   Past Medical History:  Diagnosis Date  . Allergy    SEASONAL  . Anemia   . Arthritis   . Cataract    PRESENT IN LEFT/ CATARACTS REMOVED FROM RIGHT  . Diabetes mellitus 1996  . DKA (diabetic ketoacidoses) ([place]) 01/02/2013  . Hyperlipidemia   . Hypertension   . Hypotension   . Influenza A (H1N1) 01/03/2013  . Neuromuscular disorder ([place])    NEUROPATHY  . SYNCOPE 05/10/2010   Qualifier: Diagnosis of  By: Marilynne Halsted RN, BSN, Jacquelyn     Social History   Socioeconomic History  . Marital status: Married    Spouse name: Not on file  . Number of children: 1  . Years of education: Not on file  . Highest education level: Not on file  Occupational History  . Occupation: retired  Scientific laboratory technician  . Financial resource strain: Not on file  . Food insecurity:    Worry: Not on file    Inability: Not on file  . Transportation needs:    Medical: Not on file    Non-medical: Not on file  Tobacco Use  . Smoking status: Never Smoker  . Smokeless tobacco: Never Used  Substance and Sexual Activity  . Alcohol use: No    Alcohol/week: 0.0 standard drinks  . Drug use: No  . Sexual activity: Not on file  Lifestyle  . Physical activity:    Days per week: Not on file    Minutes per session:  Not on file  . Stress: Not on file  Relationships  . Social connections:    Talks on phone: Not on file    Gets together: Not on file    Attends religious service: Not on file    Active member of club or organization: Not on file    Attends meetings of clubs or organizations: Not on file    Relationship status: Not on file  Other Topics Concern  . Not on file  Social History Narrative  . Not on file   Family History  Problem Relation Age of Onset  . Diabetes Mother   . Hypertension Mother   . Breast cancer Mother   . Aneurysm Father   . Diabetes Sister   . Hypertension Sister   . Kidney disease Sister        One Kidney transplant, one sister on HD  . Breast cancer  Sister   . Colon cancer Neg Hx   . Esophageal cancer Neg Hx    O:    Component Value Date/Time   CHOL 158 01/29/2018 1648   HDL 68 01/29/2018 1648   LDLCALC 80 01/29/2018 1648   TRIG 51 01/29/2018 1648   GLUCOSE 110 (H) 12/21/2018 1102   GLUCOSE 279 (H) 07/23/2018 1450   HGBA1C 7.9 (A) 10/08/2018 1541   HGBA1C >14.0 (A) 07/23/2018 1411   NA 143 12/21/2018 1102   K 4.6 12/21/2018 1102   CL 109 (H) 12/21/2018 1102   CO2 21 12/21/2018 1102   BUN 26 12/21/2018 1102   CREATININE 1.07 (H) 12/21/2018 1102   CREATININE 0.97 06/16/2015 1553   CALCIUM 9.2 12/21/2018 1102   GFRNONAA 54 (L) 12/21/2018 1102   GFRNONAA 63 06/16/2015 1553   GFRAA 63 12/21/2018 1102   GFRAA 72 06/16/2015 1553   AST 11 12/25/2017 1426   ALT 7 12/25/2017 1426   WBC 5.7 12/21/2018 1102   WBC 5.4 12/25/2017 1426   HGB 10.0 (L) 12/21/2018 1102   HCT 30.3 (L) 12/21/2018 1102   PLT 358 12/21/2018 1102   TSH 1.520 11/24/2015 1543   Ht Readings from Last 2 Encounters:  12/26/18 5' 1.75" (1.568 m)  12/21/18 5\' 1"  (1.549 m)   Wt Readings from Last 2 Encounters:  12/26/18 126 lb 6.4 oz (57.3 kg)  12/21/18 129 lb 1.6 oz (58.6 kg)   There is no height or weight on file to calculate BMI. BP Readings from Last 3 Encounters:  12/26/18 128/70  12/10/18 132/72  11/26/18 127/69    A/P: Patient was seen today in a co-visit with Dr. Truman Hayward. Changes made: d/c insulin.   See documentation under Dr. Marguerita Beards visit for details.

## 2018-12-26 NOTE — Assessment & Plan Note (Signed)
Continuing to endorse peripheral neuropathy from bottom of feet up to knees. Mentions that she is allergic to gabapentin and cannot recall if she had used other meds for neuropathic pain in the past - chart review shows she had used Cymbalta in the past but was discontinued. She mentions constant burning sensation intermittently throughout the day and night. She endorse prior history of symptom relief with capsaicin cream.   - Refilled capsaicin cream as requested

## 2018-12-26 NOTE — Progress Notes (Signed)
CC: Type 2 Diabetes Mellitus  HPI: Amanda [name] is a 68 y.o. F w/ PMH of T2DM with neuropathy and cataracts, HLD, and orthostatic hypotension presenting for management of her T2DM after CGM placement. She had Cgm placed last week and has been off tresiba and lisinopril after experiencing multiple syncopal episodes last month. She denies any additional falls or syncopal episodes and she has tolerated the CGM placement well without irritation around the placement site. She is continuing to endorse peripheral neuropathy of lower extremities without significant relief and is requesting refill of her capsaicin cream. She denies any episodes of hypoglycemia such as cold sweats, nausea, palpitations, malaise.  Past Medical History:  Diagnosis Date  . Allergy    SEASONAL  . Anemia   . Arthritis   . Cataract    PRESENT IN LEFT/ CATARACTS REMOVED FROM RIGHT  . Diabetes mellitus 1996  . DKA (diabetic ketoacidoses) ([place]) 01/02/2013  . Hyperlipidemia   . Hypertension   . Hypotension   . Influenza A (H1N1) 01/03/2013  . Neuromuscular disorder ([place])    NEUROPATHY  . SYNCOPE 05/10/2010   Qualifier: Diagnosis of  By: Marilynne Halsted RN, BSN, Jacquelyn     Review of Systems: Review of Systems  Constitutional: Negative for chills, diaphoresis, fever and malaise/fatigue.  Respiratory: Negative for cough and shortness of breath.   Cardiovascular: Negative for chest pain, palpitations and leg swelling.  Gastrointestinal: Negative for constipation, diarrhea, nausea and vomiting.  Neurological: Positive for tingling. Negative for dizziness, sensory change, focal weakness and headaches.     Physical Exam: Vitals:   12/26/18 1010 12/26/18 1047  BP: (!) 182/89 128/70  Pulse: 91   Temp: 97.9 F (36.6 C)   TempSrc: Oral   SpO2: 100%   Weight: 126 lb 6.4 oz (57.3 kg)   Height: 5' 1.75" (1.568 m)     Physical Exam  Constitutional: She is oriented to person, place, and time. She appears well-developed  and well-nourished. No distress.  HENT:  Mouth/Throat: Oropharynx is clear and moist.  Eyes: Conjunctivae are normal. No scleral icterus.  Neck: Normal range of motion. Neck supple. No JVD present.  Cardiovascular: Normal rate, regular rhythm, normal heart sounds and intact distal pulses.  No murmur heard. Respiratory: Effort normal and breath sounds normal. She has no rales.  GI: Soft. Bowel sounds are normal. There is no abdominal tenderness.  Musculoskeletal: Normal range of motion.        General: No edema.  Lymphadenopathy:    She has no cervical adenopathy.  Neurological: She is alert and oriented to person, place, and time.  Intact pinprick and fine touch sensation on bilateral lower extremities     Assessment & Plan:   DM II (diabetes mellitus, type II), controlled ([place]) [name] wore the CGM for 6 days. The average reading was 100, % time in target was 78, % time below target was 18, and % time above target was. 4. Intervention will be to STOP novolog 2 units TID qc. The patient will be scheduled to see in 7 days (01/02/19) for a final appointment.   Amanda [name] returns for T2DM management after CGM placed last week. She states she has had no issues with the monitor itself. Continuing to use liraglutide 1.8mg  and novolog 2 units TID qc. She mentions prior history of difficulty affording her insulin and needing to ask for samples as a result. She states she has not had any hypoglycemic symptoms such as cold sweats, tremors, syncopal episodes.  She mentions hyperglycemic episodes in line with meals heavy in white bread. Discussed importance of reducing simple carbs in her diet. Chart review shows no frequent DKA events. She is amenable to discontinuing her insulin all together.   - STOP novolog 2 units TID qc - C/w metformin 1000mg  BID, liraglutide 1.8mg  subq daily - F/u in 1 week  Orthostatic hypotension Presenting with questions about prior work up for syncopal episodes.  States she has not had any further syncopal episodes since last week after stopping lisinopril. Bp at visit today at goal 128/60. Has not had chance to follow up with PT for orthostatic hypotension education. Echo performed shows 55-60% EF w grade 1 diastolic dysfunction without significant valvular abnormalities. Discussed and explained Echo findings with her and she expressed understanding about current plan to f/u with PT.  - F/u with PT  Diabetic neuropathy Continuing to endorse peripheral neuropathy from bottom of feet up to knees. Mentions that she is allergic to gabapentin and cannot recall if she had used other meds for neuropathic pain in the past - chart review shows she had used Cymbalta in the past but was discontinued. She mentions constant burning sensation intermittently throughout the day and night. She endorse prior history of symptom relief with capsaicin cream.   - Refilled capsaicin cream as requested    Patient discussed with Dr. Evette Doffing   -Gilberto Better, PGY1

## 2018-12-26 NOTE — Addendum Note (Signed)
Addended by: Forde Dandy on: 12/26/2018 03:14 PM   Modules accepted: Orders

## 2018-12-26 NOTE — Patient Instructions (Addendum)
Mrs.Weniger  Thank you for coming in to the clinic. Here are our recommendations for this visit:  - STOP taking your novolog insulin - Please return in 1 week to download your CGM readings - Please continue to use your capsaicin cream for your pain  It was a pleasure to see you again. Hope you have a great day.  Please return in 7 days with [place] for Download #2.

## 2018-12-27 NOTE — Progress Notes (Signed)
Patty- cvan you make sure pt set up for colon with Dr. Ardis Hughs, cardiac clearance received. Thanks-JLL

## 2018-12-27 NOTE — Progress Notes (Signed)
Internal Medicine Clinic Attending ° °Case discussed with Dr. Lee at the time of the visit.  We reviewed the resident’s history and exam and pertinent patient test results.  I agree with the assessment, diagnosis, and plan of care documented in the resident’s note.  °

## 2019-01-02 ENCOUNTER — Telehealth: Payer: Self-pay | Admitting: Physician Assistant

## 2019-01-02 ENCOUNTER — Encounter: Payer: Self-pay | Admitting: Internal Medicine

## 2019-01-02 ENCOUNTER — Ambulatory Visit (INDEPENDENT_AMBULATORY_CARE_PROVIDER_SITE_OTHER): Payer: Medicare Other | Admitting: Internal Medicine

## 2019-01-02 ENCOUNTER — Ambulatory Visit: Payer: Medicare Other | Admitting: Pharmacist

## 2019-01-02 VITALS — BP 162/78 | HR 82 | Temp 98.1°F | Ht 61.0 in | Wt 130.2 lb

## 2019-01-02 DIAGNOSIS — F419 Anxiety disorder, unspecified: Secondary | ICD-10-CM | POA: Diagnosis not present

## 2019-01-02 DIAGNOSIS — E785 Hyperlipidemia, unspecified: Secondary | ICD-10-CM | POA: Diagnosis not present

## 2019-01-02 DIAGNOSIS — I10 Essential (primary) hypertension: Secondary | ICD-10-CM

## 2019-01-02 DIAGNOSIS — Z6379 Other stressful life events affecting family and household: Secondary | ICD-10-CM

## 2019-01-02 DIAGNOSIS — E118 Type 2 diabetes mellitus with unspecified complications: Secondary | ICD-10-CM

## 2019-01-02 DIAGNOSIS — E119 Type 2 diabetes mellitus without complications: Secondary | ICD-10-CM | POA: Diagnosis not present

## 2019-01-02 DIAGNOSIS — Z794 Long term (current) use of insulin: Principal | ICD-10-CM

## 2019-01-02 DIAGNOSIS — Z79899 Other long term (current) drug therapy: Secondary | ICD-10-CM

## 2019-01-02 DIAGNOSIS — E1142 Type 2 diabetes mellitus with diabetic polyneuropathy: Secondary | ICD-10-CM

## 2019-01-02 DIAGNOSIS — Z7984 Long term (current) use of oral hypoglycemic drugs: Secondary | ICD-10-CM

## 2019-01-02 MED ORDER — LISINOPRIL 20 MG PO TABS: 20.0000 mg | ORAL_TABLET | Freq: Every day | ORAL | 0 refills | Status: DC

## 2019-01-02 NOTE — Progress Notes (Signed)
CC: T2DM, Hypertension  HPI: Amanda [name] is a 68 y.o. F w/ PMh of T2DM, HLD, HTN presenting for management of her chronic conditions. She has been off her insulin since her first glucometer download and states she feels well without it. She mentions that her mother had a mastectomy yesterday and she has been feeling distressed and overwhelmed as mother is still in the hospital and she was tearful initially during evaluation. She mentions that she noticed her bp is elevated today and wonder if these recent stressful events may have played a role in it. She denies any insomnia, loss of appetite, lack of interest, difficulty with concentration. She denies any SIHI. Her bp was noted to be above 315 systolic but she denies any chest pain, palpitations, blurry vision, headaches, nausea, vomiting.  Past Medical History:  Diagnosis Date  . Allergy    SEASONAL  . Anemia   . Arthritis   . Cataract    PRESENT IN LEFT/ CATARACTS REMOVED FROM RIGHT  . Diabetes mellitus 1996  . DKA (diabetic ketoacidoses) ([place]) 01/02/2013  . Hyperlipidemia   . Hypertension   . Hypotension   . Influenza A (H1N1) 01/03/2013  . Neuromuscular disorder ([place])    NEUROPATHY  . SYNCOPE 05/10/2010   Qualifier: Diagnosis of  By: Marilynne Halsted RN, BSN, Jacquelyn     Review of Systems: Review of Systems  Constitutional: Negative for chills, fever and malaise/fatigue.  Eyes: Negative for blurred vision.  Respiratory: Negative for cough and shortness of breath.   Cardiovascular: Negative for chest pain, palpitations and leg swelling.  Gastrointestinal: Negative for constipation, diarrhea, nausea and vomiting.  Neurological: Negative for dizziness, tingling and headaches.  Psychiatric/Behavioral: Negative for depression and suicidal ideas. The patient is nervous/anxious. The patient does not have insomnia.     Physical Exam: Vitals:   01/02/19 1033 01/02/19 1103 01/02/19 1109  BP: (!) 206/88 (!) 168/86 (!) 162/78  Pulse:  82    Temp: 98.1 F (36.7 C)    TempSrc: Oral    SpO2: 100%    Weight: 130 lb 3.2 oz (59.1 kg)    Height: 5\' 1"  (1.549 m)      Physical Exam  Constitutional: She is oriented to person, place, and time. She appears well-developed and well-nourished. No distress.  HENT:  Head: Atraumatic.  Mouth/Throat: Oropharynx is clear and moist. No oropharyngeal exudate.  Eyes: Conjunctivae are normal. No scleral icterus.  Neck: Normal range of motion. Neck supple.  Cardiovascular: Normal rate, regular rhythm, normal heart sounds and intact distal pulses.  No murmur heard. Respiratory: Effort normal and breath sounds normal. She has no wheezes. She has no rales.  GI: Soft. Bowel sounds are normal. She exhibits no distension. There is no abdominal tenderness.  Musculoskeletal: Normal range of motion.        General: No edema.  Neurological: She is alert and oriented to person, place, and time.  Skin: Skin is warm and dry.    Assessment & Plan:   DM II (diabetes mellitus, type II), controlled ([place]) Amanda [name] wore the CGM for 13 days. The average reading was 117, % time in target was 83, % time below target was 9, and % time above target was. 8. Intervention will be to continue metformin 1000mg  BID and liraglutide 1.8mg  subq daily. The patient will be scheduled to see PCP at her regularly scheduled appointment.  [name] returns for T2DM management after CGM placement 2 weeks ago. She no longer has hypoglycemic events  and her daily Cgm graphs show cessation of no recorded hypoglycemic events after her novolog was discontinued last week. She had hyperglycemia above 180 at nighttime on 12/31/18 but she had consumed moderate amount of pecan pie on that day. Overall she states she feels better now that she is no longer having to take insulin TID.  - C/w metformin 1000mg  BID, liraglutide 1.8mg  subqdaily - F/u with PCP at regularly scheduled interval for hgb a1c check    Essential hypertension BP  Readings from Last 3 Encounters:  01/02/19 (!) 162/78  12/26/18 128/70  12/10/18 132/72   Her blood pressure is significantly elevated this morning despite multiple checks. Possibly due to recent social stressors including her mother's surgery. She denies any chest pain, palpitations, headaches, blurry vision, nausea or vomiting. She mentions her lisinopril was d/ced last time due to hx of orthostatic hypotension. Considering her hx of proteinuria, we will restart her lisinopril with recommendation to monitor at home. She mentions she has blood pressure cuff at home but was unable to bring her logs today.  - Restart lisinopril 20mg  daily    Patient discussed with Dr. Lynnae January   -Gilberto Better, PGY1

## 2019-01-02 NOTE — Assessment & Plan Note (Addendum)
BP Readings from Last 3 Encounters:  01/02/19 (!) 162/78  12/26/18 128/70  12/10/18 132/72   Her blood pressure is significantly elevated this morning despite multiple checks. Possibly due to recent social stressors including her mother's surgery. She denies any chest pain, palpitations, headaches, blurry vision, nausea or vomiting. She mentions her lisinopril was d/ced last time due to hx of orthostatic hypotension. Considering her hx of proteinuria, we will restart her lisinopril with recommendation to monitor at home. She mentions she has blood pressure cuff at home but was unable to bring her logs today.  - Restart lisinopril 20mg  daily - Agrees to call and update [place] on bp at home

## 2019-01-02 NOTE — Patient Instructions (Addendum)
Thank you for allowing [place] to provide your care today. Today we discussed your diabetes and hypertension  Your sugars are looking great. Make sure to avoid any significant sugary foods   Today we recommend re-starting your lisinopril 20mg  daily   Please follow-up in 1 month with Dr.[name].    Should you have any questions or concerns please call the internal medicine clinic at [phone].     DASH Eating Plan DASH stands for "Dietary Approaches to Stop Hypertension." The DASH eating plan is a healthy eating plan that has been shown to reduce high blood pressure (hypertension). It may also reduce your risk for type 2 diabetes, heart disease, and stroke. The DASH eating plan may also help with weight loss. What are tips for following this plan?  General guidelines  Avoid eating more than 2,300 mg (milligrams) of salt (sodium) a day. If you have hypertension, you may need to reduce your sodium intake to 1,500 mg a day.  Limit alcohol intake to no more than 1 drink a day for nonpregnant women and 2 drinks a day for men. One drink equals 12 oz of beer, 5 oz of wine, or 1 oz of hard liquor.  Work with your health care provider to maintain a healthy body weight or to lose weight. Ask what an ideal weight is for you.  Get at least 30 minutes of exercise that causes your heart to beat faster (aerobic exercise) most days of the week. Activities may include walking, swimming, or biking.  Work with your health care provider or diet and nutrition specialist (dietitian) to adjust your eating plan to your individual calorie needs. Reading food labels   Check food labels for the amount of sodium per serving. Choose foods with less than 5 percent of the Daily Value of sodium. Generally, foods with less than 300 mg of sodium per serving fit into this eating plan.  To find whole grains, look for the word "whole" as the first word in the ingredient list. Shopping  Buy products labeled as  "low-sodium" or "no salt added."  Buy fresh foods. Avoid canned foods and premade or frozen meals. Cooking  Avoid adding salt when cooking. Use salt-free seasonings or herbs instead of table salt or sea salt. Check with your health care provider or pharmacist before using salt substitutes.  Do not fry foods. Cook foods using healthy methods such as baking, boiling, grilling, and broiling instead.  Cook with heart-healthy oils, such as olive, canola, soybean, or sunflower oil. Meal planning  Eat a balanced diet that includes: ? 5 or more servings of fruits and vegetables each day. At each meal, try to fill half of your plate with fruits and vegetables. ? Up to 6-8 servings of whole grains each day. ? Less than 6 oz of lean meat, poultry, or fish each day. A 3-oz serving of meat is about the same size as a deck of cards. One egg equals 1 oz. ? 2 servings of low-fat dairy each day. ? A serving of nuts, seeds, or beans 5 times each week. ? Heart-healthy fats. Healthy fats called Omega-3 fatty acids are found in foods such as flaxseeds and coldwater fish, like sardines, salmon, and mackerel.  Limit how much you eat of the following: ? Canned or prepackaged foods. ? Food that is high in trans fat, such as fried foods. ? Food that is high in saturated fat, such as fatty meat. ? Sweets, desserts, sugary drinks, and other foods with added sugar. ?  Full-fat dairy products.  Do not salt foods before eating.  Try to eat at least 2 vegetarian meals each week.  Eat more home-cooked food and less restaurant, buffet, and fast food.  When eating at a restaurant, ask that your food be prepared with less salt or no salt, if possible. What foods are recommended? The items listed may not be a complete list. Talk with your dietitian about what dietary choices are best for you. Grains Whole-grain or whole-wheat bread. Whole-grain or whole-wheat pasta. Sias rice. [place]. Bulgur. Whole-grain  and low-sodium cereals. Pita bread. Low-fat, low-sodium crackers. Whole-wheat flour tortillas. Vegetables Fresh or frozen vegetables (raw, steamed, roasted, or grilled). Low-sodium or reduced-sodium tomato and vegetable juice. Low-sodium or reduced-sodium tomato sauce and tomato paste. Low-sodium or reduced-sodium canned vegetables. Fruits All fresh, dried, or frozen fruit. Canned fruit in natural juice (without added sugar). Meat and other protein foods Skinless chicken or [place]. Ground chicken or [place]. Pork with fat trimmed off. Fish and seafood. Egg whites. Dried beans, peas, or lentils. Unsalted nuts, nut butters, and seeds. Unsalted canned beans. Lean cuts of beef with fat trimmed off. Low-sodium, lean deli meat. Dairy Low-fat (1%) or fat-free (skim) milk. Fat-free, low-fat, or reduced-fat cheeses. Nonfat, low-sodium ricotta or cottage cheese. Low-fat or nonfat yogurt. Low-fat, low-sodium cheese. Fats and oils Soft margarine without trans fats. Vegetable oil. Low-fat, reduced-fat, or light mayonnaise and salad dressings (reduced-sodium). Canola, safflower, olive, soybean, and sunflower oils. Avocado. Seasoning and other foods Herbs. Spices. Seasoning mixes without salt. Unsalted popcorn and pretzels. Fat-free sweets. What foods are not recommended? The items listed may not be a complete list. Talk with your dietitian about what dietary choices are best for you. Grains Baked goods made with fat, such as croissants, muffins, or some breads. Dry pasta or rice meal packs. Vegetables Creamed or fried vegetables. Vegetables in a cheese sauce. Regular canned vegetables (not low-sodium or reduced-sodium). Regular canned tomato sauce and paste (not low-sodium or reduced-sodium). Regular tomato and vegetable juice (not low-sodium or reduced-sodium). Angie Fava. Olives. Fruits Canned fruit in a light or heavy syrup. Fried fruit. Fruit in cream or butter sauce. Meat and other protein foods Fatty cuts  of meat. Ribs. Fried meat. Berniece Salines. Sausage. Bologna and other processed lunch meats. Salami. Fatback. Hotdogs. Bratwurst. Salted nuts and seeds. Canned beans with added salt. Canned or smoked fish. Whole eggs or egg yolks. Chicken or [place] with skin. Dairy Whole or 2% milk, cream, and half-and-half. Whole or full-fat cream cheese. Whole-fat or sweetened yogurt. Full-fat cheese. Nondairy creamers. Whipped toppings. Processed cheese and cheese spreads. Fats and oils Butter. Stick margarine. Lard. Shortening. Ghee. Bacon fat. Tropical oils, such as coconut, palm kernel, or palm oil. Seasoning and other foods Salted popcorn and pretzels. Onion salt, garlic salt, seasoned salt, table salt, and sea salt. Worcestershire sauce. Tartar sauce. Barbecue sauce. Teriyaki sauce. Soy sauce, including reduced-sodium. Steak sauce. Canned and packaged gravies. Fish sauce. Oyster sauce. Cocktail sauce. Horseradish that you find on the shelf. Ketchup. Mustard. Meat flavorings and tenderizers. Bouillon cubes. Hot sauce and Tabasco sauce. Premade or packaged marinades. Premade or packaged taco seasonings. Relishes. Regular salad dressings. Where to find more information:  National Heart, Lung, and [place]: https://wilson-eaton.com/  American Heart Association: www.heart.org Summary  The DASH eating plan is a healthy eating plan that has been shown to reduce high blood pressure (hypertension). It may also reduce your risk for type 2 diabetes, heart disease, and stroke.  With the DASH eating plan, you  should limit salt (sodium) intake to 2,300 mg a day. If you have hypertension, you may need to reduce your sodium intake to 1,500 mg a day.  When on the DASH eating plan, aim to eat more fresh fruits and vegetables, whole grains, lean proteins, low-fat dairy, and heart-healthy fats.  Work with your health care provider or diet and nutrition specialist (dietitian) to adjust your eating plan to your individual calorie  needs. This information is not intended to replace advice given to you by your health care provider. Make sure you discuss any questions you have with your health care provider. Document Released: 11/24/2011 Document Revised: 11/28/2016 Document Reviewed: 11/28/2016 Elsevier Interactive Patient Education  2019 [employer].

## 2019-01-02 NOTE — Assessment & Plan Note (Signed)
Amanda [name] wore the CGM for 13 days. The average reading was 117, % time in target was 83, % time below target was 9, and % time above target was. 8. Intervention will be to continue metformin 1000mg  BID and liraglutide 1.8mg  subq daily. The patient will be scheduled to see PCP at her regularly scheduled appointment.  Amanda [name] returns for T2DM management after CGM placement 2 weeks ago. She no longer has hypoglycemic events and her daily Cgm graphs show cessation of no recorded hypoglycemic events after her novolog was discontinued last week. She had hyperglycemia above 180 at nighttime on 12/31/18 but she had consumed moderate amount of pecan pie on that day. Overall she states she feels better now that she is no longer having to take insulin TID.  - C/w metformin 1000mg  BID, liraglutide 1.8mg  subqdaily - F/u with PCP at regularly scheduled interval for hgb a1c check

## 2019-01-02 NOTE — Telephone Encounter (Signed)
May from [facility] called to inform that Dr. Shan Levans has already given complete clearance for colon.

## 2019-01-03 ENCOUNTER — Encounter: Payer: Self-pay | Admitting: Gastroenterology

## 2019-01-04 ENCOUNTER — Ambulatory Visit: Payer: Medicare Other

## 2019-01-04 NOTE — Progress Notes (Signed)
S: Amanda [name] is a 68 y.o. female reports to clinical pharmacist appointment for diabetes management  Allergies  Allergen Reactions  . Gabapentin Other (See Comments)    Pt states medication made her feel "out of it".   Pt states medication made her feel "out of it".     Medication Sig  ACCU-CHEK SOFTCLIX LANCETS lancets Check blood sugar 4x/day before meal an bedtime  acetaminophen (TYLENOL) 325 MG tablet Take 2 tablets (650 mg total) by mouth every 6 (six) hours as needed (or Fever >/= 101).  aspirin EC 81 MG tablet Take 81 mg by mouth daily.  atorvastatin (LIPITOR) 40 MG tablet Take 1 tablet (40 mg total) by mouth daily.  benzonatate (TESSALON) 100 MG capsule Take 1 capsule (100 mg total) by mouth every 8 (eight) hours. Patient taking differently: Take 100 mg by mouth every 8 (eight) hours as needed.   Capsaicin-Menthol-Methyl Sal (CAPSAICIN-METHYL SAL-MENTHOL) 0.025-1-12 % CREA Apply 1 application topically as needed. Apply over painful areas  cetirizine (ZYRTEC) 5 MG tablet Take 1 tablet (5 mg total) by mouth daily.  Cholecalciferol (VITAMIN D3) 2000 units capsule Take by mouth daily.   Continuous Blood Gluc Sensor (FREESTYLE LIBRE 14 DAY SENSOR) MISC 1 each by Does not apply route 4 (four) times daily.  fluticasone (FLONASE) 50 MCG/ACT nasal spray Place 2 sprays into both nostrils daily.  glucose blood (ACCU-CHEK AVIVA PLUS) test strip Check blood sugar 4x/day before all meals and bedtime  latanoprost (XALATAN) 0.005 % ophthalmic solution 1 drop at bedtime.  liraglutide (VICTOZA) 18 MG/3ML SOPN Inject 0.3 mLs (1.8 mg total) into the skin daily.  lisinopril (PRINIVIL,ZESTRIL) 20 MG tablet Take 1 tablet (20 mg total) by mouth daily.  metFORMIN (GLUCOPHAGE-XR) 500 MG 24 hr tablet Take 2 tablets (1,000 mg total) by mouth 2 (two) times daily.  Multiple Vitamins-Minerals (WOMENS 50+ MULTI VITAMIN/MIN) TABS Take 1 tablet by mouth daily.   Past Medical History:  Diagnosis Date  .  Allergy    SEASONAL  . Anemia   . Arthritis   . Cataract    PRESENT IN LEFT/ CATARACTS REMOVED FROM RIGHT  . Diabetes mellitus 1996  . DKA (diabetic ketoacidoses) ([place]) 01/02/2013  . Hyperlipidemia   . Hypertension   . Hypotension   . Influenza A (H1N1) 01/03/2013  . Neuromuscular disorder ([place])    NEUROPATHY  . SYNCOPE 05/10/2010   Qualifier: Diagnosis of  By: Marilynne Halsted RN, BSN, Jacquelyn     Social History   Socioeconomic History  . Marital status: Married    Spouse name: Not on file  . Number of children: 1  . Years of education: Not on file  . Highest education level: Not on file  Occupational History  . Occupation: retired  Scientific laboratory technician  . Financial resource strain: Not on file  . Food insecurity:    Worry: Not on file    Inability: Not on file  . Transportation needs:    Medical: Not on file    Non-medical: Not on file  Tobacco Use  . Smoking status: Never Smoker  . Smokeless tobacco: Never Used  Substance and Sexual Activity  . Alcohol use: No    Alcohol/week: 0.0 standard drinks  . Drug use: No  . Sexual activity: Not on file  Lifestyle  . Physical activity:    Days per week: Not on file    Minutes per session: Not on file  . Stress: Not on file  Relationships  . Social connections:  Talks on phone: Not on file    Gets together: Not on file    Attends religious service: Not on file    Active member of club or organization: Not on file    Attends meetings of clubs or organizations: Not on file    Relationship status: Not on file  Other Topics Concern  . Not on file  Social History Narrative  . Not on file   Family History  Problem Relation Age of Onset  . Diabetes Mother   . Hypertension Mother   . Breast cancer Mother   . Aneurysm Father   . Diabetes Sister   . Hypertension Sister   . Kidney disease Sister        One Kidney transplant, one sister on HD  . Breast cancer Sister   . Colon cancer Neg Hx   . Esophageal cancer Neg Hx    O:     Component Value Date/Time   CHOL 158 01/29/2018 1648   HDL 68 01/29/2018 1648   LDLCALC 80 01/29/2018 1648   TRIG 51 01/29/2018 1648   GLUCOSE 110 (H) 12/21/2018 1102   GLUCOSE 279 (H) 07/23/2018 1450   HGBA1C 7.9 (A) 10/08/2018 1541   HGBA1C >14.0 (A) 07/23/2018 1411   NA 143 12/21/2018 1102   K 4.6 12/21/2018 1102   CL 109 (H) 12/21/2018 1102   CO2 21 12/21/2018 1102   BUN 26 12/21/2018 1102   CREATININE 1.07 (H) 12/21/2018 1102   CREATININE 0.97 06/16/2015 1553   CALCIUM 9.2 12/21/2018 1102   GFRNONAA 54 (L) 12/21/2018 1102   GFRNONAA 63 06/16/2015 1553   GFRAA 63 12/21/2018 1102   GFRAA 72 06/16/2015 1553   AST 11 12/25/2017 1426   ALT 7 12/25/2017 1426   WBC 5.7 12/21/2018 1102   WBC 5.4 12/25/2017 1426   HGB 10.0 (L) 12/21/2018 1102   HCT 30.3 (L) 12/21/2018 1102   PLT 358 12/21/2018 1102   TSH 1.520 11/24/2015 1543   Ht Readings from Last 2 Encounters:  01/02/19 5\' 1"  (1.549 m)  12/26/18 5' 1.75" (1.568 m)   Wt Readings from Last 2 Encounters:  01/02/19 130 lb 3.2 oz (59.1 kg)  12/26/18 126 lb 6.4 oz (57.3 kg)   There is no height or weight on file to calculate BMI. BP Readings from Last 3 Encounters:  01/02/19 (!) 162/78  12/26/18 128/70  12/10/18 132/72   A/P: Patient was seen today in a co-visit with Dr. Truman Hayward and Butch Penny Plyler. Patient was continued on liraglutide 1.8 mg daily. See documentation under Dr. Marguerita Beards visit for details.   An after visit summary was provided and patient advised to follow up if any changes in condition or questions regarding medications arise.   The patient verbalized understanding of information provided by repeating back concepts discussed.

## 2019-01-04 NOTE — Progress Notes (Signed)
Internal Medicine Clinic Attending  Case discussed with Dr. Truman Hayward at the time of the visit.  We reviewed the resident's history and exam and pertinent patient test results.  I agree with the assessment, diagnosis, and plan of care documented in the resident's note.  I personally reviewed the CGM data, the resident's interpretation, and agree with the intervention.

## 2019-01-07 ENCOUNTER — Ambulatory Visit: Payer: Medicare Other | Admitting: Rehabilitation

## 2019-01-08 ENCOUNTER — Ambulatory Visit
Admission: RE | Admit: 2019-01-08 | Discharge: 2019-01-08 | Disposition: A | Discharge status: Home / Self Care | Payer: Medicare Other | Source: Ambulatory Visit | Attending: Hematology and Oncology | Admitting: Hematology and Oncology

## 2019-01-08 DIAGNOSIS — Z1231 Encounter for screening mammogram for malignant neoplasm of breast: Secondary | ICD-10-CM | POA: Diagnosis not present

## 2019-01-10 ENCOUNTER — Ambulatory Visit: Payer: Medicare Other | Admitting: Physical Therapy

## 2019-01-10 DIAGNOSIS — R269 Unspecified abnormalities of gait and mobility: Secondary | ICD-10-CM

## 2019-01-10 HISTORY — DX: Unspecified abnormalities of gait and mobility: R26.9

## 2019-01-10 NOTE — Assessment & Plan Note (Signed)
Placed order for physical therapy.

## 2019-01-10 NOTE — Addendum Note (Signed)
Addended byDewayne Hatch on: 01/10/2019 05:40 PM   Modules accepted: Orders

## 2019-01-11 ENCOUNTER — Encounter: Payer: Self-pay | Admitting: Gastroenterology

## 2019-01-11 ENCOUNTER — Ambulatory Visit (AMBULATORY_SURGERY_CENTER): Payer: Medicare Other | Admitting: Gastroenterology

## 2019-01-11 VITALS — BP 155/79 | HR 94 | Temp 97.3°F | Resp 12

## 2019-01-11 DIAGNOSIS — R194 Change in bowel habit: Secondary | ICD-10-CM | POA: Diagnosis not present

## 2019-01-11 DIAGNOSIS — R197 Diarrhea, unspecified: Secondary | ICD-10-CM

## 2019-01-11 DIAGNOSIS — K635 Polyp of colon: Secondary | ICD-10-CM | POA: Diagnosis not present

## 2019-01-11 DIAGNOSIS — D123 Benign neoplasm of transverse colon: Secondary | ICD-10-CM

## 2019-01-11 MED ORDER — SODIUM CHLORIDE 0.9 % IV SOLN: 500.0000 mL | Freq: Once | INTRAVENOUS | Status: DC

## 2019-01-11 NOTE — Progress Notes (Signed)
Report given to PACU, vss 

## 2019-01-11 NOTE — Patient Instructions (Signed)
Handout given on polyp   YOU HAD AN ENDOSCOPIC PROCEDURE TODAY AT [facility]:   Refer to the procedure report that was given to you for any specific questions about what was found during the examination.  If the procedure report does not answer your questions, please call your gastroenterologist to clarify.  If you requested that your care partner not be given the details of your procedure findings, then the procedure report has been included in a sealed envelope for you to review at your convenience later.  YOU SHOULD EXPECT: Some feelings of bloating in the abdomen. Passage of more gas than usual.  Walking can help get rid of the air that was put into your GI tract during the procedure and reduce the bloating. If you had a lower endoscopy (such as a colonoscopy or flexible sigmoidoscopy) you may notice spotting of blood in your stool or on the toilet paper. If you underwent a bowel prep for your procedure, you may not have a normal bowel movement for a few days.  Please Note:  You might notice some irritation and congestion in your nose or some drainage.  This is from the oxygen used during your procedure.  There is no need for concern and it should clear up in a day or so.  SYMPTOMS TO REPORT IMMEDIATELY:   Following lower endoscopy (colonoscopy or flexible sigmoidoscopy):  Excessive amounts of blood in the stool  Significant tenderness or worsening of abdominal pains  Swelling of the abdomen that is new, acute  Fever of 100F or higher   For urgent or emergent issues, a gastroenterologist can be reached at any hour by calling [phone].   DIET:  We do recommend a small meal at first, but then you may proceed to your regular diet.  Drink plenty of fluids but you should avoid alcoholic beverages for 24 hours.  ACTIVITY:  You should plan to take it easy for the rest of today and you should NOT DRIVE or use heavy machinery until tomorrow (because of the sedation  medicines used during the test).    FOLLOW UP: Our staff will call the number listed on your records the next business day following your procedure to check on you and address any questions or concerns that you may have regarding the information given to you following your procedure. If we do not reach you, we will leave a message.  However, if you are feeling well and you are not experiencing any problems, there is no need to return our call.  We will assume that you have returned to your regular daily activities without incident.  If any biopsies were taken you will be contacted by phone or by letter within the next 1-3 weeks.  Please call us at [phone] if you have not heard about the biopsies in 3 weeks.    SIGNATURES/CONFIDENTIALITY: You and/or your care partner have signed paperwork which will be entered into your electronic medical record.  These signatures attest to the fact that that the information above on your After Visit Summary has been reviewed and is understood.  Full responsibility of the confidentiality of this discharge information lies with you and/or your care-partner.

## 2019-01-11 NOTE — Progress Notes (Signed)
Pt's states no medical or surgical changes since previsit or office visit. 

## 2019-01-11 NOTE — Op Note (Signed)
[facility] Patient Name: Amanda [name] Procedure Date: 01/11/2019 1:26 PM MRN: [id number] Endoscopist: Milus Banister , MD Age: 68 Referring MD:  Date of Birth: 10-Apr-1951 Gender: Female Account #: [id number] Procedure:                Colonoscopy Indications:              Chronic intermittent diarrhea; Adenomatous colon                            polyps,colonoscopy June 2015 Dr. Ardis Hughs for                            chronic intermittent diarrhea found                            twosubcentimeter adenomas. Recommended recall at 5                            year interval.Chronic intermittent                            diarrhea,colonoscopy June 2015 found normal                            terminal ileum, random biopsies of the colon showed                            no microscopic colitis. Fiber seemed to helpin                            2015 Medicines:                Monitored Anesthesia Care Procedure:                Pre-Anesthesia Assessment:                           - Prior to the procedure, a History and Physical                            was performed, and patient medications and                            allergies were reviewed. The patient's tolerance of                            previous anesthesia was also reviewed. The risks                            and benefits of the procedure and the sedation                            options and risks were discussed with the patient.                            All questions were answered,  and informed consent                            was obtained. Prior Anticoagulants: The patient has                            taken no previous anticoagulant or antiplatelet                            agents. ASA Grade Assessment: II - A patient with                            mild systemic disease. After reviewing the risks                            and benefits, the patient was deemed in   satisfactory condition to undergo the procedure.                           After obtaining informed consent, the colonoscope                            was passed under direct vision. Throughout the                            procedure, the patient's blood pressure, pulse, and                            oxygen saturations were monitored continuously. The                            Colonoscope was introduced through the anus and                            advanced to the the cecum, identified by                            appendiceal orifice and ileocecal valve. The                            colonoscopy was performed without difficulty. The                            patient tolerated the procedure well. The quality                            of the bowel preparation was good. The ileocecal                            valve, appendiceal orifice, and rectum were                            photographed. Scope In: 1:33:54 PM Scope Out: 1:48:38 PM Scope Withdrawal Time: 0 hours 9 minutes 4 seconds  Total Procedure Duration:  0 hours 14 minutes 44 seconds  Findings:                 A 1 mm polyp was found in the transverse colon. The                            polyp was sessile. The polyp was removed with a                            cold snare. Resection and retrieval were complete.                           The exam was otherwise without abnormality on                            direct and retroflexion views.                           Biopsies for histology were taken with a cold                            forceps from the entire colon for evaluation of                            microscopic colitis. Complications:            No immediate complications. Estimated blood loss:                            None. Estimated Blood Loss:     Estimated blood loss: none. Impression:               - One 1 mm polyp in the transverse colon, removed                            with a cold snare. Resected and  retrieved.                           - The examination was otherwise normal on direct                            and retroflexion views.                           - Biopsies were taken with a cold forceps from the                            entire colon for evaluation of microscopic colitis. Recommendation:           - Patient has a contact number available for                            emergencies. The signs and symptoms of potential                            delayed complications  were discussed with the                            patient. Return to normal activities tomorrow.                            Written discharge instructions were provided to the                            patient.                           - Resume previous diet.                           - Continue present medications.                           You will receive a letter within 2-3 weeks with the                            pathology results and my final recommendations.                           If the polyp(s) is proven to be 'pre-cancerous' on                            pathology, you will need repeat colonoscopy in 5                            years. If the polyp(s) is NOT 'precancerous' on                            pathology then you should repeat colon cancer                            screening in 10 years with colonoscopy without need                            for colon cancer screening by any method prior to                            then (including stool testing). Milus Banister, MD 01/11/2019 1:52:40 PM This report has been signed electronically.

## 2019-01-11 NOTE — Progress Notes (Signed)
Called to room to assist during endoscopic procedure.  Patient ID and intended procedure confirmed with present staff. Received instructions for my participation in the procedure from the performing physician.  

## 2019-01-14 ENCOUNTER — Telehealth: Payer: Self-pay

## 2019-01-14 NOTE — Telephone Encounter (Signed)
  Follow up Call-  Call back number 01/11/2019  Post procedure Call Back phone  # [phone]  Permission to leave phone message Yes  Some recent data might be hidden     Patient questions:  Do you have a fever, pain , or abdominal swelling? No. Pain Score  0 *  Have you tolerated food without any problems? Yes.    Have you been able to return to your normal activities? Yes.    Do you have any questions about your discharge instructions: Diet   No. Medications  No. Follow up visit  No.  Do you have questions or concerns about your Care? No.  Actions: * If pain score is 4 or above: No action needed, pain <4.

## 2019-01-14 NOTE — Telephone Encounter (Signed)
Left message to follow up later this afternoon.

## 2019-01-16 MED FILL — LISINOPRIL 20 MG TABLET: 20 | 30 days supply | Qty: 30 | Fill #1

## 2019-01-16 MED FILL — LATANOPROST 0.005% EYE DRP: 0.005 | 25 days supply | Qty: 3 | Fill #3

## 2019-01-18 ENCOUNTER — Ambulatory Visit: Payer: Medicare Other | Admitting: Podiatry

## 2019-01-18 ENCOUNTER — Telehealth: Payer: Self-pay

## 2019-01-18 NOTE — Telephone Encounter (Signed)
Pt would like a sample on liraglutide (VICTOZA) 18 MG/3ML SOPN. Please call pt back.

## 2019-01-18 NOTE — Telephone Encounter (Signed)
Patient last seen in [facility] on 01/02/2019 and was to continue Victoza 3 mg/mL 1.8 mg daily. Next appt with PCP 02/04/2019. Pharm D unavailable today. Will send request to Clinical Director. [name], RN, BSN

## 2019-01-18 NOTE — Telephone Encounter (Signed)
There is a bag with 3 boxes of victoza and pen needles in Sample fridge with patient's name on it. Patient notified and will come by today to pick it up. [name], RN, BSN

## 2019-01-21 ENCOUNTER — Encounter: Payer: Self-pay | Admitting: Gastroenterology

## 2019-01-22 ENCOUNTER — Ambulatory Visit: Payer: Medicare Other | Admitting: Physical Therapy

## 2019-01-23 ENCOUNTER — Encounter: Payer: Self-pay | Admitting: *Deleted

## 2019-01-23 ENCOUNTER — Ambulatory Visit: Payer: Medicare Other | Admitting: Podiatry

## 2019-02-04 ENCOUNTER — Encounter: Payer: Medicare Other | Admitting: Internal Medicine

## 2019-02-05 ENCOUNTER — Encounter: Payer: Self-pay | Admitting: Physical Therapy

## 2019-02-05 ENCOUNTER — Ambulatory Visit: Payer: Medicare Other | Attending: Internal Medicine | Admitting: Physical Therapy

## 2019-02-05 DIAGNOSIS — R42 Dizziness and giddiness: Secondary | ICD-10-CM | POA: Diagnosis not present

## 2019-02-05 DIAGNOSIS — R2689 Other abnormalities of gait and mobility: Secondary | ICD-10-CM

## 2019-02-05 DIAGNOSIS — M6281 Muscle weakness (generalized): Secondary | ICD-10-CM | POA: Diagnosis not present

## 2019-02-05 NOTE — Therapy (Addendum)
[facility] [address] [place], [place], [address] Phone: [phone]   Fax:  [phone]  Physical Therapy Evaluation Addendum to add in BP recordings as they did not pull through Elsie Ra, PT, DPT 02/05/19 3:56 PM   Patient Details  Name: Amanda [name] MRN: [id number] Date of Birth: 03-07-1951 Referring Provider (PT): Bartholomew Crews, MD   Encounter Date: 02/05/2019  PT End of Session - 02/05/19 1101    Visit Number  1    Number of Visits  12    Date for PT Re-Evaluation  03/19/19    Authorization Type  [facility] MCR    PT Start Time  0850    PT Stop Time  0940    PT Time Calculation (min)  50 min    Activity Tolerance  Patient tolerated treatment well    Behavior During Therapy  [facility] for tasks assessed/performed       Past Medical History:  Diagnosis Date  . Allergy    SEASONAL  . Anemia   . Arthritis   . Cataract    PRESENT IN LEFT/ CATARACTS REMOVED FROM RIGHT  . Diabetes mellitus 1996  . DKA (diabetic ketoacidoses) ([place]) 01/02/2013  . Hyperlipidemia   . Hypertension   . Hypotension   . Influenza A (H1N1) 01/03/2013  . Neuromuscular disorder ([place])    NEUROPATHY  . SYNCOPE 05/10/2010   Qualifier: Diagnosis of  By: Marilynne Halsted RN, BSN, Jacquelyn      Past Surgical History:  Procedure Laterality Date  . ABDOMINAL HYSTERECTOMY     10 years ago   . BREAST BIOPSY    . Cataracts    . ROTATOR CUFF REPAIR    . SHOULDER SURGERY     Rotator cuff tear       Orthostatic Blood Pressure recordings as follows Supine 161/90, after supine to sit dropped to 141/77, after sit to stand dropped to 119/67, she expresses mild lightheadedness after each transition.   Subjective Assessment - 02/05/19 0853    Subjective  Pt relays 3 falls in January 2 going down ramp to get in car, and one just walking she got wobbly and feel. Her MD has noted some orthostatic hypotension and wants to have this checked today. 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  5 min    How long can you walk comfortably?  one block    Patient Stated Goals  be more steady on her feet    Currently in Pain?  Other (Comment)   light neuropathy pain in feet and calves        [facility] PT Assessment - 02/05/19 0001      Assessment   Medical Diagnosis  Gait abnormality, Orthostatic hypotension    Referring Provider (PT)  Bartholomew Crews, MD    Hand Dominance  Right    Next MD Visit  ?    Prior Therapy  none      Precautions   Precautions  Fall      Balance Screen   Has the patient fallen in the past 6 months  Yes    How many times?  3    Has the patient had a decrease in activity level because of a fear of falling?  Yes    Is the patient reluctant to leave their home because of a fear of falling?   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   Leisure  meet with family and church      Cognition   Overall Cognitive Status  Within Functional Limits for tasks assessed      Sensation   Light Touch  Appears Intact      Coordination   Gross Motor Movements are Fluid and Coordinated  Yes    Finger Nose Finger Test  good    Heel Shin Test  good      ROM / Strength   AROM / PROM / Strength  Strength      Strength   Overall Strength Comments  UE strength 5/5 grossly, LE strength Rt 5/5, Lt 4/5 MMT all grossly tested in sitting      Ambulation/Gait   Ambulation/Gait  Yes    Ambulation/Gait Assistance  5: Supervision    Ambulation Distance (Feet)  100 Feet    Gait Pattern  Step-through pattern    Gait velocity  3.3  Ft/sec on 20 ft walk test    Stairs  Yes    Stairs Assistance  5: Supervision    Stair Management Technique  Alternating pattern   no handrail ascending but needed one handrail descending   Number of Stairs  4    Height of Stairs  6    Gait Comments  wider BOS, at times she is stead and other times tends to be more unsteady to her Lt and this is her weaker leg      Balance   Balance Assessed  Yes      Standardized Balance Assessment   Standardized Balance Assessment  Berg Balance Test;Dynamic Gait Index;Five Times Sit to Stand    Five times sit to stand comments   18   some complaints of lightheadedness     Berg Balance Test   Sit to Stand  Able to stand without using hands and stabilize independently    Standing Unsupported  Able to stand safely 2 minutes    Sitting with Back Unsupported but Feet Supported on Floor or Stool  Able to sit safely and securely 2 minutes    Stand to Sit  Sits safely with minimal use of hands    Transfers  Able to transfer safely, minor use of hands    Standing Unsupported with Eyes Closed  Able to stand 10 seconds with supervision    Standing Ubsupported with Feet Together  Able to place feet together independently and stand for 1 minute with supervision    From Standing, Reach Forward with Outstretched Arm  Can reach confidently >25 cm (10")    From Standing Position, Pick up Object from Floor  Able to pick up shoe, needs supervision    From Standing Position, Turn to Look Behind Over each Shoulder  Looks behind from both sides and weight shifts well    Turn 360 Degrees  Able to turn 360 degrees safely one side only in 4 seconds or less    Standing Unsupported, Alternately Place Feet on Step/Stool  Able to stand independently and complete 8 steps >20 seconds  Standing Unsupported, One Foot in [place] to take small step independently and hold 30 seconds    Standing on One Leg  Able to lift leg independently and hold equal to or more than 3 seconds     Total Score  47      Dynamic Gait Index   Level Surface  Normal    Change in Gait Speed  Normal    Gait with Horizontal Head Turns  Moderate Impairment    Gait with Vertical Head Turns  Mild Impairment    Gait and Pivot Turn  Mild Impairment    Step Over Obstacle  Mild Impairment    Step Around Obstacles  Mild Impairment    Steps  Mild Impairment    Total Score  17                Objective measurements completed on examination: See above findings.              PT Education - 02/05/19 1100    Education Details  HEP, exam findings, POC, increase time to transition from supine to sit then sit to stand to let BP acclimate better    Person(s) Educated  Patient    Methods  Explanation;Demonstration;Handout;Verbal cues    Comprehension  Verbalized understanding;Need further instruction;Returned demonstration          PT Long Term Goals - 02/05/19 1544      PT LONG TERM GOAL #1   Title  Pt will be I and compliant with HEP. 6 weeks 03/19/19    Status  New      PT LONG TERM GOAL #2   Title  Pt will improve BERG to >50 and DGI to 19 to show improved balance. 6 weeks 03/19/19      PT LONG TERM GOAL #3   Title  Pt will show slower safer transition from supine to to stand to allow for less symptoms of orthostatic HTN. 6 weeks 03/19/19    Status  New      PT LONG TERM GOAL #4   Title  Pt will improve Lt Leg strength to at least 4+/5 MMT grossly tested in sitting. 6 weeks 03/19/19             Plan - 02/05/19 1120    Clinical Impression Statement  Pt presents with gait/balance unsteadiness, 3 recent falls, and orthostatic hypotension which was assessed today and she did did have significant drop in blood pressure transitoning from supine to sit to stand (see above for BP recordings). She was educated to transition more slowly to allow her BP a chance to acclimate better and she needs to wait until she does not feel lightheadedness before she transitions again.  She also has Lt leg weakness which may also be contibuting to her decreased balance. Balance tests place her at a mild increased risk for falls. She will benefiit from skilled PT to address her defecits.     History and Personal Factors relevant to plan of care:  PMH: DM,DKA,HTN,HTN and also orthostatic hypotension,neuropathy    Clinical Presentation  Evolving    Clinical Presentation due to:  recent falls and unstable BP changes    Clinical Decision Making  High    Rehab Potential  Good    PT Frequency  2x / week   1-2   PT Duration  6 weeks    PT Treatment/Interventions  Cryotherapy;Moist Heat;Gait training;Stair training;Functional mobility training;Therapeutic activities;Therapeutic exercise;Balance training;Manual techniques;Vestibular  PT Next Visit Plan  review HEP and udate as needed for lt Leg strength, balance, monitor BP and teach to transition more slowly.     PT Home Exercise Plan  [email or web address]    Consulted and Agree with Plan of Care  Patient       Patient will benefit from skilled therapeutic intervention in order to improve the following deficits and impairments:  Abnormal gait, Decreased activity tolerance, Decreased endurance, Decreased strength, Decreased balance, Difficulty walking, Pain  Visit Diagnosis: Other abnormalities of gait and mobility  Dizziness and giddiness  Muscle weakness (generalized)     Problem List Patient Active Problem List   Diagnosis Date Noted  . Gait abnormality 01/10/2019  . Near syncope 11/26/2018  . Nail fungus 10/23/2017  . Cervical radiculopathy due to degenerative joint disease of spine 05/03/2017  . 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 Pain of both shoulder joints 11/14/2016  . Orthostatic hypotension 09/15/2016  . Vitreous hemorrhage of left eye ([place]) 05/13/2016  . Osteopenia 05/04/2016  . Posterior capsular opacification of left eye, obscuring vision 02/15/2016  . MDD (major depressive disorder), recurrent episode,  moderate ([place]) 02/08/2016  . Malaise 11/24/2015  . Pseudophakia of both eyes 07/27/2015  . Constipation 09/16/2014  . Healthcare maintenance 06/03/2013  . Diabetic neuropathy ([place]) 05/20/2013  . Type 2 diabetes mellitus with both eyes affected by proliferative retinopathy and macular edema, with long-term current use of insulin ([place]) 04/19/2013  . Seasonal allergies 03/12/2013  . Dyslipidemia 02/15/2013  . DM II (diabetes mellitus, type II), controlled ([place]) 02/14/2013  . Need for immunization against influenza 02/14/2013  . Essential hypertension 05/10/2010    Silvestre Mesi 02/05/2019, 3:49 PM  [facility] [address] [place], [place], [address] Phone: [phone]   Fax:  [phone]  Name: TESIA LYBRAND MRN: [id number] Date of Birth: [birth year]

## 2019-02-05 NOTE — Patient Instructions (Signed)
Access Code: [email or web address]  URL: [email or web address]  Date: 02/05/2019  Prepared by: Elsie Ra   Exercises  Mini Squat with Counter Support - 10 reps - 3 sets - 2x daily - 6x weekly  Backward Walking with Counter Support - 3-5 reps - 1 sets - 2x daily - 6x weekly  Side Stepping with Counter Support - 3-5 reps - 1 sets - 2x daily - 6x weekly  Standing Tandem Balance with Counter Support - 3 reps - 1 sets - 30 hold - 2x daily - 6x weekly  Standing Single Leg Stance with Counter Support - 10 reps - 3 sets - 2x daily - 6x weekly  Sit to Stand without Arm Support - 10 reps - 1-2 sets - 2x daily - 6x weekly

## 2019-02-06 ENCOUNTER — Other Ambulatory Visit: Payer: Self-pay

## 2019-02-06 ENCOUNTER — Ambulatory Visit (INDEPENDENT_AMBULATORY_CARE_PROVIDER_SITE_OTHER): Payer: Medicare Other | Admitting: Internal Medicine

## 2019-02-06 DIAGNOSIS — R5381 Other malaise: Secondary | ICD-10-CM | POA: Diagnosis not present

## 2019-02-06 DIAGNOSIS — R0981 Nasal congestion: Secondary | ICD-10-CM | POA: Diagnosis not present

## 2019-02-06 DIAGNOSIS — N189 Chronic kidney disease, unspecified: Secondary | ICD-10-CM

## 2019-02-06 DIAGNOSIS — R05 Cough: Secondary | ICD-10-CM

## 2019-02-06 DIAGNOSIS — J029 Acute pharyngitis, unspecified: Secondary | ICD-10-CM | POA: Diagnosis not present

## 2019-02-06 DIAGNOSIS — R6889 Other general symptoms and signs: Secondary | ICD-10-CM

## 2019-02-06 DIAGNOSIS — R5383 Other fatigue: Secondary | ICD-10-CM

## 2019-02-06 DIAGNOSIS — R6883 Chills (without fever): Secondary | ICD-10-CM

## 2019-02-06 LAB — INFLUENZA PANEL BY PCR (TYPE A & B)
Influenza A By PCR: NEGATIVE
Influenza B By PCR: NEGATIVE

## 2019-02-06 MED ORDER — HYDROCOD POLST-CPM POLST ER 10-8 MG/5ML PO SUER: 5.0000 mL | Freq: Every evening | ORAL | 0 refills | Status: DC | PRN

## 2019-02-06 NOTE — Assessment & Plan Note (Addendum)
Patient is here with 3 days of flulike symptoms.  She received her flu shot this year and has not had any sick contacts that she is aware of.  She reports symptoms of diffuse body aches, chills, sore throat, congestion, and cough.  She has been taking Tylenol and an old prescription of tessalon perles with only minimal relief.  Unable to take NSAIDs with CKD.  Discussed likely diagnosis of influenza infection.  She is outside of the treatment window for Tamiflu.  Advised supportive care.  Continue Tylenol, will prescribe short course of Tussionex cough syrup as needed. --Follow-up flu swab -Tussionex 5 mL nightly as needed --Continue Tylenol as needed  ADDENDUM: flu negative. Called patient with results. Reports she was unable to afford the tussionex cough syrup but feels slightly better today. Requesting a more affordable cough medication. Sent message to clinic pharmacist for assistance.

## 2019-02-06 NOTE — Progress Notes (Signed)
   CC: Cough  HPI:  [name] is a 68 y.o. female with past medical history outlined below here for cough. For the details of today's visit, please refer to the assessment and plan.  Past Medical History:  Diagnosis Date  . Allergy    SEASONAL  . Anemia   . Arthritis   . Cataract    PRESENT IN LEFT/ CATARACTS REMOVED FROM RIGHT  . Diabetes mellitus 1996  . DKA (diabetic ketoacidoses) ([place]) 01/02/2013  . Hyperlipidemia   . Hypertension   . Hypotension   . Influenza A (H1N1) 01/03/2013  . Neuromuscular disorder ([place])    NEUROPATHY  . SYNCOPE 05/10/2010   Qualifier: Diagnosis of  By: Marilynne Halsted RN, BSN, Jacquelyn      Review of Systems  Constitutional: Positive for chills and malaise/fatigue. Negative for fever.  HENT: Positive for sinus pain and sore throat.   Musculoskeletal: Positive for myalgias.    Physical Exam:  Vitals:   02/06/19 1423  BP: (!) 152/78  Pulse: 100  Temp: 99.6 F (37.6 C)  TempSrc: Oral  SpO2: 100%  Weight: 124 lb 3.2 oz (56.3 kg)  Height: 5\' 1"  (1.549 m)    Constitutional: NAD, appears comfortable.  Cardiovascular: RRR, no m/r/g Pulmonary/Chest: CTAB, no wheezes, rales, or rhonchi.  Extremities: Warm and well perfused. No edema.  Psychiatric: Normal mood and affect  Assessment & Plan:   See Encounters Tab for problem based charting.  Patient discussed with Dr. Daryll Drown

## 2019-02-06 NOTE — Patient Instructions (Addendum)
Ms. Charon,  I am sorry you have not been feeling well! I will call you with the results of your flu test today. I have sent a prescription for a cough medicine to your pharmacy.   Please keep you scheduled appointment with Dr. Shan Levans next month. If you have any questions or concerns, call our clinic at [phone] or after hours call [phone] and ask for the internal medicine resident on call. Thank you!  Dr. Philipp Ovens

## 2019-02-07 NOTE — Progress Notes (Signed)
Internal Medicine Clinic Attending  Case discussed with Dr. Guilloud at the time of the visit.  We reviewed the resident's history and exam and pertinent patient test results.  I agree with the assessment, diagnosis, and plan of care documented in the resident's note.  

## 2019-02-11 ENCOUNTER — Ambulatory Visit: Payer: Medicare Other | Admitting: Physical Therapy

## 2019-02-11 ENCOUNTER — Telehealth: Payer: Self-pay | Admitting: Pharmacist

## 2019-02-11 DIAGNOSIS — R6889 Other general symptoms and signs: Secondary | ICD-10-CM

## 2019-02-11 NOTE — Progress Notes (Signed)
Contacted patient to help with Tussionex pricing. Patient states she is interested in trying OTC agents and will contact clinic if symptoms persist/worsen. We discussed OTC options including dextromethorphan for cough. She also reports rhinorrhea/congestion for which she is taking cetirizine and fluticasone nasal spray.

## 2019-02-13 ENCOUNTER — Telehealth: Payer: Self-pay | Admitting: Internal Medicine

## 2019-02-13 ENCOUNTER — Ambulatory Visit: Payer: Medicare Other | Admitting: Physical Therapy

## 2019-02-13 NOTE — Telephone Encounter (Signed)
Return call made to [facility].  CMA is unfamiliar with this program, unable to speak to anyone regarding 90 day supply request-had to leave message on recorder.  Call was also made to patient to clarify pharmacy, she is currently using [facility], but would consider mail order if her copay is cheaper.Despina Hidden Cassady2/26/202012:20 PM   Will await call from [facility]

## 2019-02-13 NOTE — Telephone Encounter (Signed)
[employer] wanted to know if the physician can write the prescription for lisinopril (PRINIVIL,ZESTRIL) 20 MG tablet for 90days supply instead of 30 day supply  ;contact [phone]

## 2019-02-14 MED FILL — LISINOPRIL 20 MG TABLET: 20 | 90 days supply | Qty: 90 | Fill #0

## 2019-02-18 ENCOUNTER — Ambulatory Visit: Payer: Medicare Other | Admitting: Podiatry

## 2019-02-18 DIAGNOSIS — M79675 Pain in left toe(s): Secondary | ICD-10-CM

## 2019-02-18 DIAGNOSIS — E1142 Type 2 diabetes mellitus with diabetic polyneuropathy: Secondary | ICD-10-CM

## 2019-02-18 DIAGNOSIS — B351 Tinea unguium: Secondary | ICD-10-CM

## 2019-02-18 DIAGNOSIS — M79674 Pain in right toe(s): Secondary | ICD-10-CM | POA: Diagnosis not present

## 2019-02-18 NOTE — Patient Instructions (Signed)

## 2019-02-19 ENCOUNTER — Ambulatory Visit: Payer: Medicare Other | Admitting: Pharmacist

## 2019-02-19 ENCOUNTER — Ambulatory Visit: Payer: Medicare Other | Admitting: Physical Therapy

## 2019-02-21 ENCOUNTER — Ambulatory Visit: Payer: Medicare Other | Admitting: Physical Therapy

## 2019-02-24 ENCOUNTER — Encounter: Payer: Self-pay | Admitting: Podiatry

## 2019-02-24 NOTE — Progress Notes (Signed)
Subjective: Amanda [name] presents today with history of neuropathy with cc of painful, mycotic toenails.  Pain is aggravated when wearing enclosed shoe gear and relieved with periodic professional debridement.  Patient has peripheral neuropathy managed with  [name], MD is her PCP and last vist was 10/08/2018.   Current Outpatient Medications:  .  ACCU-CHEK SOFTCLIX LANCETS lancets, Check blood sugar 4x/day before meal an bedtime, Disp: 200 each, Rfl: 7 .  acetaminophen (TYLENOL) 325 MG tablet, Take 2 tablets (650 mg total) by mouth every 6 (six) hours as needed (or Fever >/= 101)., Disp: 60 tablet, Rfl: 0 .  aspirin EC 81 MG tablet, Take 81 mg by mouth daily., Disp: , Rfl:  .  atorvastatin (LIPITOR) 40 MG tablet, Take 1 tablet (40 mg total) by mouth daily., Disp: 90 tablet, Rfl: 3 .  benzonatate (TESSALON) 100 MG capsule, Take 1 capsule (100 mg total) by mouth every 8 (eight) hours. (Patient taking differently: Take 100 mg by mouth every 8 (eight) hours as needed. ), Disp: 21 capsule, Rfl: 0 .  Capsaicin-Menthol-Methyl Sal (CAPSAICIN-METHYL SAL-MENTHOL) 0.025-1-12 % CREA, Apply 1 application topically as needed. Apply over painful areas, Disp: 1 Tube, Rfl: 1 .  cetirizine (ZYRTEC) 5 MG tablet, Take 1 tablet (5 mg total) by mouth daily., Disp: 30 tablet, Rfl: 1 .  Cholecalciferol (VITAMIN D3) 2000 units capsule, Take by mouth daily. , Disp: , Rfl:  .  Continuous Blood Gluc Sensor (FREESTYLE LIBRE 14 DAY SENSOR) MISC, 1 each by Does not apply route 4 (four) times daily., Disp: 2 each, Rfl: 12 .  fluticasone (FLONASE) 50 MCG/ACT nasal spray, Place 2 sprays into both nostrils daily., Disp: 16 g, Rfl: 2 .  glucose blood (ACCU-CHEK AVIVA PLUS) test strip, Check blood sugar 4x/day before all meals and bedtime, Disp: 125 each, Rfl: 12 .  latanoprost (XALATAN) 0.005 % ophthalmic solution, 1 drop at bedtime., Disp: , Rfl:  .  liraglutide (VICTOZA) 18 MG/3ML SOPN, Inject 0.3 mLs (1.8 mg  total) into the skin daily., Disp: 3 pen, Rfl: 11 .  lisinopril (PRINIVIL,ZESTRIL) 20 MG tablet, Take 1 tablet (20 mg total) by mouth daily., Disp: 90 tablet, Rfl: 0 .  metFORMIN (GLUCOPHAGE-XR) 500 MG 24 hr tablet, Take 2 tablets (1,000 mg total) by mouth 2 (two) times daily., Disp: 360 tablet, Rfl: 2 .  Multiple Vitamins-Minerals (WOMENS 50+ MULTI VITAMIN/MIN) TABS, Take 1 tablet by mouth daily., Disp: 30 tablet, Rfl:   Allergies  Allergen Reactions  . Gabapentin Other (See Comments)    Pt states medication made her feel "out of it".   Pt states medication made her feel "out of it".      Objective:  Vascular Examination: Capillary refill time <3 seconds x 10 digits  Dorsalis pedis and Posterior tibial pulses present b/l  Digital hair x 10 digits was present   Skin temperature gradient WNL b/l  Dermatological Examination: Skin with normal turgor, texture and tone b/l  Toenails 1-5 b/l discolored, thick, dystrophic with subungual debris and pain with palpation to nailbeds due to thickness of nails.  Musculoskeletal: Muscle strength 5/5 to all muscle groups b/l  Neurological: Sensation with 10 gram monofilament is intact  b/l Vibratory sensation intact  b/l  Assessment: 1. Painful onychomycosis toenails 1-5 b/l 2. NIDDM with neuropathy  Plan: 1. Toenails 1-5 b/l were debrided in length and girth without iatrogenic bleeding. 2. Patient to continue soft, supportive shoe gear 3. Patient to report any pedal injuries to medical professional  4. Follow up 3 months.  5. Patient/POA to call should there be a concern in the interim.

## 2019-02-25 ENCOUNTER — Encounter: Payer: Self-pay | Admitting: Physical Therapy

## 2019-02-25 ENCOUNTER — Ambulatory Visit: Payer: Medicare Other | Admitting: Physical Therapy

## 2019-02-25 VITALS — BP 198/106 | HR 85

## 2019-02-25 DIAGNOSIS — R42 Dizziness and giddiness: Secondary | ICD-10-CM | POA: Insufficient documentation

## 2019-02-25 DIAGNOSIS — M6281 Muscle weakness (generalized): Secondary | ICD-10-CM | POA: Insufficient documentation

## 2019-02-25 DIAGNOSIS — R2689 Other abnormalities of gait and mobility: Secondary | ICD-10-CM | POA: Insufficient documentation

## 2019-02-25 NOTE — Therapy (Signed)
[facility] [address] [place], [place], [address] Phone: [phone]   Fax:  [phone]  Physical Therapy Treatment  Patient Details  Name: Amanda [name] MRN: [id number] Date of Birth: 05-Oct-1951 Referring Provider (PT): Bartholomew Crews, MD   Encounter Date: 02/25/2019  PT End of Session - 02/25/19 0809    Visit Number  --    Number of Visits  12    Date for PT Re-Evaluation  03/19/19    Authorization Type  [facility] M[facility]    PT Start Time  0806    PT Stop Time  0820   no charge due to medical issues   PT Time Calculation (min)  14 min    Activity Tolerance  Treatment limited secondary to medical complications (Comment)    Behavior During Therapy  [facility] for tasks assessed/performed       Past Medical History:  Diagnosis Date  . Allergy    SEASONAL  . Anemia   . Arthritis   . Cataract    PRESENT IN LEFT/ CATARACTS REMOVED FROM RIGHT  . Diabetes mellitus 1996  . DKA (diabetic ketoacidoses) ([place]) 01/02/2013  . Hyperlipidemia   . Hypertension   . Hypotension   . Influenza A (H1N1) 01/03/2013  . Neuromuscular disorder ([place])    NEUROPATHY  . SYNCOPE 05/10/2010   Qualifier: Diagnosis of  By: Marilynne Halsted RN, BSN, Jacquelyn      Past Surgical History:  Procedure Laterality Date  . ABDOMINAL HYSTERECTOMY     10 years ago   . BREAST BIOPSY    . Cataracts    . ROTATOR CUFF REPAIR    . SHOULDER SURGERY     Rotator cuff tear     Vitals:   02/25/19 0808 02/25/19 0815  BP: (!) 201/105 (!) 198/106  Pulse: 85     Subjective Assessment - 02/25/19 0808    Subjective  No new complaitns. Has had the flu for the past several weeks, so not exercising much. No falls.     Pertinent History  PMH: DM,DKA,HTN,HTN and also orthostatic hypotension,neuropathy    How long can you stand comfortably?  5 min    How long can you walk comfortably?  one block    Patient Stated Goals  be more steady on her feet    Currently in Pain?   No/denies    Pain Score  0-No pain              PT Long Term Goals - 02/05/19 1544      PT LONG TERM GOAL #1   Title  Pt will be I and compliant with HEP. 6 weeks 03/19/19    Status  New      PT LONG TERM GOAL #2   Title  Pt will improve BERG to >50 and DGI to 19 to show improved balance. 6 weeks 03/19/19      PT LONG TERM GOAL #3   Title  Pt will show slower safer transition from supine to to stand to allow for less symptoms of orthostatic HTN. 6 weeks 03/19/19    Status  New      PT LONG TERM GOAL #4   Title  Pt will improve Lt Leg strength to at least 4+/5 MMT grossly tested in sitting. 6 weeks 03/19/19            Plan - 02/25/19 0809    Clinical Impression Statement  Pt with elevated BP on initial check with  automated machine. Manual recheck placed BP at 198/106. Pt's reports not taking her meds this am, usually takes them between 9-10am. Session cancelled with pt to go home and take meds. She is to recheck her BP about an hour after taking her meds. Pt re-educated on s/s of CVA and to call 911 if they occur. Pt verbalized understanding.     Rehab Potential  Good    PT Frequency  2x / week   1-2   PT Duration  6 weeks    PT Treatment/Interventions  Cryotherapy;Moist Heat;Gait training;Stair training;Functional mobility training;Therapeutic activities;Therapeutic exercise;Balance training;Manual techniques;Vestibular    PT Next Visit Plan  review HEP and udate as needed for lt Leg strength, balance, monitor BP and teach to transition more slowly.     PT Home Exercise Plan  [email or web address]    Consulted and Agree with Plan of Care  Patient       Patient will benefit from skilled therapeutic intervention in order to improve the following deficits and impairments:  Abnormal gait, Decreased activity tolerance, Decreased endurance, Decreased strength, Decreased balance, Difficulty walking, Pain  Visit Diagnosis: Dizziness and giddiness     Problem List Patient Active  Problem List   Diagnosis Date Noted  . Flu-like symptoms 02/06/2019  . Gait abnormality 01/10/2019  . Near syncope 11/26/2018  . Nail fungus 10/23/2017  . Cervical radiculopathy due to degenerative joint disease of spine 05/03/2017  . 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 Pain of both shoulder joints 11/14/2016  . Orthostatic hypotension 09/15/2016  . Vitreous hemorrhage of left eye ([place]) 05/13/2016  . Osteopenia 05/04/2016  . Posterior capsular opacification of left eye, obscuring vision 02/15/2016  . MDD (major depressive disorder), recurrent episode, moderate ([place]) 02/08/2016  . Malaise 11/24/2015  . Pseudophakia of both eyes 07/27/2015  . Constipation 09/16/2014  . Healthcare maintenance 06/03/2013  . Diabetic neuropathy ([place]) 05/20/2013  . Type 2 diabetes mellitus with both eyes affected by proliferative retinopathy and macular edema, with long-term current use of insulin ([place]) 04/19/2013  . Seasonal allergies 03/12/2013  . Dyslipidemia 02/15/2013  . DM II (diabetes mellitus, type II), controlled ([place]) 02/14/2013  . Need for immunization against influenza 02/14/2013  . Essential hypertension 05/10/2010    Willow Ora, PTA, [facility] [address], [place], [place] [address] [phone] 02/25/19, 8:25 AM   Name: Amanda [name] MRN: [id number] Date of Birth: [birth year]

## 2019-02-27 ENCOUNTER — Encounter: Payer: Self-pay | Admitting: Physical Therapy

## 2019-02-27 ENCOUNTER — Ambulatory Visit: Payer: Medicare Other | Attending: Internal Medicine | Admitting: Physical Therapy

## 2019-02-27 VITALS — BP 142/79 | HR 92

## 2019-02-27 DIAGNOSIS — R42 Dizziness and giddiness: Secondary | ICD-10-CM | POA: Diagnosis present

## 2019-02-27 DIAGNOSIS — M6281 Muscle weakness (generalized): Secondary | ICD-10-CM | POA: Diagnosis present

## 2019-02-27 DIAGNOSIS — R2689 Other abnormalities of gait and mobility: Secondary | ICD-10-CM

## 2019-02-27 NOTE — Patient Instructions (Signed)
Access Code: [email or web address]  URL: [email or web address]  Date: 02/27/2019  Prepared by: Willow Ora   Exercises  Standing Hip Extension with Counter Support - 10 reps - 1-2 sets - 2x daily - 6x weekly  Side Stepping with Counter Support - 3 reps - 1 sets - 1x daily - 5x weekly  Walking March - 3 reps - 1 sets - 1x daily - 5x weekly  Heel Toe Raises with Counter Support - 10 reps - 1-2 sets - 2x daily - 6x weekly  Standing Single Leg Stance with Counter Support - 3 reps - 1 sets - 10 hold - 1x daily - 5x weekly  Standing Tandem Balance with Counter Support - 3 reps - 1 sets - 20 hold - 1x daily - 5x weekly  Romberg Stance Eyes Closed on Foam Pad - 3 reps - 1 sets - 30 hold - 1x daily - 5x weekly  Wide Stance with Eyes Closed and Head Rotation on Foam Pad - 10 reps - 1 sets - 1x daily - 5x weekly  Wide Stance with Eyes Closed and Head Nods on Foam Pad - 10 reps - 1 sets - 1x daily - 5x weekly

## 2019-02-27 NOTE — Therapy (Signed)
[facility] [address] [place], [place], [address] Phone: [phone]   Fax:  [phone]  Physical Therapy Treatment  Patient Details  Name: Amanda [name] MRN: [id number] Date of Birth: 07-07-1951 Referring Provider (PT): Bartholomew Crews, MD   Encounter Date: 02/27/2019    02/27/19 0855  PT Visits / Re-Eval  Visit Number 2  Number of Visits 12  Date for PT Re-Evaluation 03/19/19  Authorization  Authorization Type [facility] MCR  PT Time Calculation  PT Start Time 0851  PT Stop Time 0930  PT Time Calculation (min) 39 min  PT - End of Session  Equipment Utilized During Treatment Gait belt  Activity Tolerance Patient tolerated treatment well;No increased pain  Behavior During Therapy WFL for tasks assessed/performed    Past Medical History:  Diagnosis Date  . Allergy    SEASONAL  . Anemia   . Arthritis   . Cataract    PRESENT IN LEFT/ CATARACTS REMOVED FROM RIGHT  . Diabetes mellitus 1996  . DKA (diabetic ketoacidoses) ([place]) 01/02/2013  . Hyperlipidemia   . Hypertension   . Hypotension   . Influenza A (H1N1) 01/03/2013  . Neuromuscular disorder ([place])    NEUROPATHY  . SYNCOPE 05/10/2010   Qualifier: Diagnosis of  By: Marilynne Halsted RN, BSN, Jacquelyn      Past Surgical History:  Procedure Laterality Date  . ABDOMINAL HYSTERECTOMY     10 years ago   . BREAST BIOPSY    . Cataracts    . ROTATOR CUFF REPAIR    . SHOULDER SURGERY     Rotator cuff tear     Vitals:   02/27/19 0853 02/27/19 0906 02/27/19 0920  BP: 134/77 (!) 156/77 (!) 142/79  Pulse:  95 92    Subjective Assessment - 02/27/19 0853    Subjective  No new complatins. No falls. Some shoulder pain today.     Pertinent History  PMH: DM,DKA,HTN,HTN and also orthostatic hypotension,neuropathy    How long can you walk comfortably?  one block    Patient Stated Goals  be more steady on her feet    Currently in Pain?  Yes    Pain Score  8     Pain  Location  Shoulder    Pain Orientation  Left    Pain Descriptors / Indicators  Aching    Pain Type  Acute pain    Pain Onset  In the past 7 days    Pain Frequency  Intermittent    Aggravating Factors   carrying items such as purse, etc    Pain Relieving Factors  tylenol       Treatment: focused on review and advancement of HEP with the following issued to HEP. Min guard assist for balance with balance ex's. Cues for posture and ex form with all ex's.   Access Code: [email or web address]  URL: [email or web address]  Date: 02/27/2019  Prepared by: Willow Ora   Exercises  Standing Hip Extension with Counter Support - 10 reps - 1-2 sets - 2x daily - 6x weekly  Side Stepping with Counter Support - 3 reps - 1 sets - 1x daily - 5x weekly  Walking March - 3 reps - 1 sets - 1x daily - 5x weekly  Heel Toe Raises with Counter Support - 10 reps - 1-2 sets - 2x daily - 6x weekly  Standing Single Leg Stance with Counter Support - 3 reps - 1 sets - 10 hold - 1x daily - 5x weekly  Standing Tandem Balance with Counter Support - 3 reps - 1 sets - 20 hold - 1x daily - 5x weekly  Romberg Stance Eyes Closed on Foam Pad - 3 reps - 1 sets - 30 hold - 1x daily - 5x weekly  Wide Stance with Eyes Closed and Head Rotation on Foam Pad - 10 reps - 1 sets - 1x daily - 5x weekly  Wide Stance with Eyes Closed and Head Nods on Foam Pad - 10 reps - 1 sets - 1x daily - 5x weekly      PT Education - 02/27/19 0925    Education Details  advanced/updated HEP; BP response to session today.     Person(s) Educated  Patient    Methods  Explanation;Demonstration;Verbal cues;Handout    Comprehension  Verbalized understanding;Returned demonstration;Verbal cues required;Need further instruction          PT Long Term Goals - 02/05/19 1544      PT LONG TERM GOAL #1   Title  Pt will be I and compliant with HEP. 6 weeks 03/19/19    Status  New      PT LONG TERM GOAL #2   Title  Pt will improve BERG to >50 and DGI to  19 to show improved balance. 6 weeks 03/19/19      PT LONG TERM GOAL #3   Title  Pt will show slower safer transition from supine to to stand to allow for less symptoms of orthostatic HTN. 6 weeks 03/19/19    Status  New      PT LONG TERM GOAL #4   Title  Pt will improve Lt Leg strength to at least 4+/5 MMT grossly tested in sitting. 6 weeks 03/19/19            Plan - 02/27/19 0856    Clinical Impression Statement  Today's skilled session focused on review and advancement of pt's HEP as she reports most were too easy. BP responded well to session today. The pt is progressing toward goals and should benefit from continued PT to progress toward unmet goals     Rehab Potential  Good    PT Frequency  2x / week   1-2   PT Duration  6 weeks    PT Treatment/Interventions  Cryotherapy;Moist Heat;Gait training;Stair training;Functional mobility training;Therapeutic activities;Therapeutic exercise;Balance training;Manual techniques;Vestibular    PT Next Visit Plan  LE strengthening (left>right), balance reactions, monitor BP and reinforce to transition more slowly.     PT Home Exercise Plan  [email or web address]    Consulted and Agree with Plan of Care  Patient       Patient will benefit from skilled therapeutic intervention in order to improve the following deficits and impairments:  Abnormal gait, Decreased activity tolerance, Decreased endurance, Decreased strength, Decreased balance, Difficulty walking, Pain  Visit Diagnosis: Dizziness and giddiness  Other abnormalities of gait and mobility  Muscle weakness (generalized)     Problem List Patient Active Problem List   Diagnosis Date Noted  . Flu-like symptoms 02/06/2019  . Gait abnormality 01/10/2019  . Near syncope 11/26/2018  . Nail fungus 10/23/2017  . Cervical radiculopathy due to degenerative joint disease of spine 05/03/2017  . 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 Pain of both shoulder joints 11/14/2016  . Orthostatic  hypotension 09/15/2016  . Vitreous hemorrhage of left eye ([place]) 05/13/2016  . Osteopenia 05/04/2016  . Posterior capsular opacification of left eye, obscuring vision 02/15/2016  . MDD (major depressive disorder), recurrent episode, moderate ([place]) 02/08/2016  .  Malaise 11/24/2015  . Pseudophakia of both eyes 07/27/2015  . Constipation 09/16/2014  . Healthcare maintenance 06/03/2013  . Diabetic neuropathy ([place]) 05/20/2013  . Type 2 diabetes mellitus with both eyes affected by proliferative retinopathy and macular edema, with long-term current use of insulin ([place]) 04/19/2013  . Seasonal allergies 03/12/2013  . Dyslipidemia 02/15/2013  . DM II (diabetes mellitus, type II), controlled ([place]) 02/14/2013  . Need for immunization against influenza 02/14/2013  . Essential hypertension 05/10/2010    Willow Ora, PTA, [facility] [address] [place], [place] [address] [phone] 02/28/19, 9:12 PM   Name: Amanda [name] MRN: [id number] Date of Birth: 10/19/1951

## 2019-03-01 ENCOUNTER — Ambulatory Visit: Payer: Medicare Other | Admitting: Physical Therapy

## 2019-03-04 ENCOUNTER — Ambulatory Visit: Payer: Medicare Other | Admitting: Physical Therapy

## 2019-03-05 MED FILL — ACCU-CHEK AVIVA PLUS TEST S: 25 days supply | Qty: 100 | Fill #1

## 2019-03-06 ENCOUNTER — Ambulatory Visit: Payer: Medicare Other | Admitting: Physical Therapy

## 2019-03-06 MED FILL — ATORVASTATIN 40 MG TABLET: 40 | 90 days supply | Qty: 90 | Fill #0

## 2019-03-06 MED FILL — metFORMIN HCL ER 500 MG TB2: 500 | 90 days supply | Qty: 360 | Fill #3

## 2019-03-11 ENCOUNTER — Telehealth: Payer: Self-pay | Admitting: Physical Therapy

## 2019-03-11 NOTE — Telephone Encounter (Signed)
[facility] was contacted today regarding the temporary closing of [facility] due to Covid-19.  Therapist discussed: Pt was issued an HEP at her last visit here. Therapist reviewed the HEP verbally with the pt stating she has been doing them and has no concerns. Advised the pt the front office will contact her to make new appointments on reopening of clinic. The pt was appreciative of the phone call.    [facility] will follow up with patients when we are able to resume care.  Willow Ora, PTA, [facility] [address] [place], [place] [address] [phone] 03/11/19, 10:28 AM   [facility] [address] [place], [place]  [address] Phone:  [phone] Fax:  [phone] \

## 2019-03-12 ENCOUNTER — Ambulatory Visit: Payer: Medicare Other | Admitting: Physical Therapy

## 2019-03-14 ENCOUNTER — Ambulatory Visit: Payer: Medicare Other | Admitting: Physical Therapy

## 2019-03-18 ENCOUNTER — Encounter: Payer: Self-pay | Admitting: Internal Medicine

## 2019-03-18 ENCOUNTER — Other Ambulatory Visit: Payer: Self-pay

## 2019-03-18 ENCOUNTER — Ambulatory Visit (INDEPENDENT_AMBULATORY_CARE_PROVIDER_SITE_OTHER): Payer: Medicare Other | Admitting: Internal Medicine

## 2019-03-18 ENCOUNTER — Other Ambulatory Visit: Payer: Self-pay | Admitting: Internal Medicine

## 2019-03-18 DIAGNOSIS — E118 Type 2 diabetes mellitus with unspecified complications: Secondary | ICD-10-CM | POA: Diagnosis not present

## 2019-03-18 DIAGNOSIS — D649 Anemia, unspecified: Secondary | ICD-10-CM

## 2019-03-18 DIAGNOSIS — Z794 Long term (current) use of insulin: Secondary | ICD-10-CM | POA: Diagnosis not present

## 2019-03-18 MED ORDER — FLUTICASONE PROPIONATE 50 MCG/ACT NA SUSP: 2.0000 | Freq: Every day | NASAL | 2 refills | Status: DC

## 2019-03-18 MED ORDER — CETIRIZINE HCL 5 MG PO TABS: 5.0000 mg | ORAL_TABLET | Freq: Every day | ORAL | 1 refills | Status: DC

## 2019-03-18 NOTE — Assessment & Plan Note (Addendum)
A: T2DM: averaging 120's-130's, no low sugars feels she is eating well at this time.  She says she was switched to ozempic once weekly at a dose of 0.25mg  due to availability by Dr. Maudie Mercury was on trulicity prior.  She also takes metformin 1000mg  BID.  She is opposed to taking insulin.  I would prefer less glp-1 agonists due to her weight but don't have many options.  Not a good candidate for SGLT-2i due to orthostatic hypotension history and tendency to get dehydrated.   P: Continue current regimen

## 2019-03-18 NOTE — Progress Notes (Addendum)
.     [facility] Telephone Encounter  Reason for call:   This telephone encounter was created for Ms. [name] on 03/18/2019 for the following purpose/cc Normocytic Anemia, T2DM.   Pertinent Data:   Normal colonoscopy, mammo, pap   Assessment / Plan / Recommendations:   A: Normocytic Anemia: has had to take iron in the past years ago doesn't remember when.  Ferritin still within the normal range and no known chronic inflammatory state to explain a higher value.  Had recent colonoscopy which was normal. Up to date on mammo and pap.  She is a never smoker.  Does report less energy over the last few years.    P: at next visit will get spep, upep IFE, kappa/lambda, LDH, CMP, CBC, iron profile, reticulocyte count, urinalysis  A: T2DM: averaging 120's-130's, no low sugars feels she is eating well at this time.  She says she was switched to ozempic once weekly at a dose of 0.25mg  for four weeks then increase to 0.5mg  weekly after that due to availability by Dr. Maudie Mercury was on trulicity prior.  She also takes metformin 1000mg  BID.  She is opposed to taking insulin would prefer less glp-1 agonists due to her weight but don't have many options.  Not a good candidate for SGLT-2i due to orthostatic hypotension history and tendency to get dehydrated.  P: Continue current regimen   Consent and Medical Decision Making:   Patient discussed with Dr. Beryle Beams  This is a telephone encounter between [name] and Vickki Muff on 03/18/2019 for Normocytic Anemia and T2DM. The visit was conducted with the patient located at home and Vickki Muff at [facility]. The patient's identity was confirmed using their DOB and current address. The patient has consented to being evaluated through a telephone encounter and understands the associated risks (an examination cannot be done and the patient may need to come in for an appointment) / benefits (allows the patient to remain at home,  decreasing exposure to coronavirus). I personally spent 25 minutes on medical discussion.

## 2019-03-18 NOTE — Assessment & Plan Note (Signed)
A: Normocytic Anemia: has had to take iron in the past years ago doesn't remember when.  Ferritin still within the normal range and no known chronic inflammatory state to explain a higher value.  Had recent colonoscopy which was normal. Up to date on mammo and pap.  She is a never smoker.  Does report less energy over the last few years.  Denies any vaginal bleeding.  Last UA in 2015 which was neg for RBC's  P: at next visit will get spep, upep IFE, kappa/lambda, LDH, CMP, CBC, iron profile, reticulocyte count, urinalysis

## 2019-03-19 ENCOUNTER — Ambulatory Visit: Payer: Medicare Other | Admitting: Physical Therapy

## 2019-03-19 NOTE — Progress Notes (Signed)
Medicine attending: Medical history, presenting problems, and medications, reviewed with resident physician Dr Guadlupe Spanish on the day of the patient telephone consultation and I concur with his evaluation and management plan.

## 2019-03-20 ENCOUNTER — Telehealth: Payer: Self-pay

## 2019-03-20 NOTE — Telephone Encounter (Signed)
Left pt a message, after discussing with Dr. Darnell Level I was deterred from a trial of iron at this time, we will repeat blood work at her next visit.  No urgency to this.

## 2019-03-20 NOTE — Telephone Encounter (Signed)
Pt states the iron med is not at the pharmacy. Pt is using  [facility] [address] [phone] (Phone) [phone] (Fax)   Please call pt back.

## 2019-03-20 NOTE — Telephone Encounter (Signed)
Pt is calling check on medicine [phone]

## 2019-03-21 ENCOUNTER — Ambulatory Visit: Payer: Medicare Other | Admitting: Physical Therapy

## 2019-05-20 ENCOUNTER — Ambulatory Visit: Payer: Medicare Other | Admitting: Podiatry

## 2019-05-20 ENCOUNTER — Other Ambulatory Visit: Payer: Self-pay

## 2019-05-20 ENCOUNTER — Encounter: Payer: Self-pay | Admitting: Podiatry

## 2019-05-20 VITALS — Temp 97.3°F

## 2019-05-20 DIAGNOSIS — M79674 Pain in right toe(s): Secondary | ICD-10-CM

## 2019-05-20 DIAGNOSIS — B351 Tinea unguium: Secondary | ICD-10-CM

## 2019-05-20 DIAGNOSIS — M79675 Pain in left toe(s): Secondary | ICD-10-CM | POA: Diagnosis not present

## 2019-05-20 NOTE — Patient Instructions (Signed)
Diabetes Mellitus and Foot Care °Foot care is an important part of your health, especially when you have diabetes. Diabetes may cause you to have problems because of poor blood flow (circulation) to your feet and legs, which can cause your skin to: °· Become thinner and drier. °· Break more easily. °· Heal more slowly. °· Peel and crack. °You may also have nerve damage (neuropathy) in your legs and feet, causing decreased feeling in them. This means that you may not notice minor injuries to your feet that could lead to more serious problems. Noticing and addressing any potential problems early is the best way to prevent future foot problems. °How to care for your feet °Foot hygiene °· Wash your feet daily with warm water and mild soap. Do not use hot water. Then, pat your feet and the areas between your toes until they are completely dry. Do not soak your feet as this can dry your skin. °· Trim your toenails straight across. Do not dig under them or around the cuticle. File the edges of your nails with an emery board or nail file. °· Apply a moisturizing lotion or petroleum jelly to the skin on your feet and to dry, brittle toenails. Use lotion that does not contain alcohol and is unscented. Do not apply lotion between your toes. °Shoes and socks °· Wear clean socks or stockings every day. Make sure they are not too tight. Do not wear knee-high stockings since they may decrease blood flow to your legs. °· Wear shoes that fit properly and have enough cushioning. Always look in your shoes before you put them on to be sure there are no objects inside. °· To break in new shoes, wear them for just a few hours a day. This prevents injuries on your feet. °Wounds, scrapes, corns, and calluses °· Check your feet daily for blisters, cuts, bruises, sores, and redness. If you cannot see the bottom of your feet, use a mirror or ask someone for help. °· Do not cut corns or calluses or try to remove them with medicine. °· If you  find a minor scrape, cut, or break in the skin on your feet, keep it and the skin around it clean and dry. You may clean these areas with mild soap and water. Do not clean the area with peroxide, alcohol, or iodine. °· If you have a wound, scrape, corn, or callus on your foot, look at it several times a day to make sure it is healing and not infected. Check for: °? Redness, swelling, or pain. °? Fluid or blood. °? Warmth. °? Pus or a bad smell. °General instructions °· Do not cross your legs. This may decrease blood flow to your feet. °· Do not use heating pads or hot water bottles on your feet. They may burn your skin. If you have lost feeling in your feet or legs, you may not know this is happening until it is too late. °· Protect your feet from hot and cold by wearing shoes, such as at the beach or on hot pavement. °· Schedule a complete foot exam at least once a year (annually) or more often if you have foot problems. If you have foot problems, report any cuts, sores, or bruises to your health care provider immediately. °Contact a health care provider if: °· You have a medical condition that increases your risk of infection and you have any cuts, sores, or bruises on your feet. °· You have an injury that is not   healing. °· You have redness on your legs or feet. °· You feel burning or tingling in your legs or feet. °· You have pain or cramps in your legs and feet. °· Your legs or feet are numb. °· Your feet always feel cold. °· You have pain around a toenail. °Get help right away if: °· You have a wound, scrape, corn, or callus on your foot and: °? You have pain, swelling, or redness that gets worse. °? You have fluid or blood coming from the wound, scrape, corn, or callus. °? Your wound, scrape, corn, or callus feels warm to the touch. °? You have pus or a bad smell coming from the wound, scrape, corn, or callus. °? You have a fever. °? You have a red line going up your leg. °Summary °· Check your feet every day  for cuts, sores, red spots, swelling, and blisters. °· Moisturize feet and legs daily. °· Wear shoes that fit properly and have enough cushioning. °· If you have foot problems, report any cuts, sores, or bruises to your health care provider immediately. °· Schedule a complete foot exam at least once a year (annually) or more often if you have foot problems. °This information is not intended to replace advice given to you by your health care provider. Make sure you discuss any questions you have with your health care provider. °Document Released: 12/02/2000 Document Revised: 01/17/2018 Document Reviewed: 01/06/2017 °Elsevier Interactive Patient Education © 2019 [employer] ° °Diabetic Neuropathy °Diabetic neuropathy refers to nerve damage that is caused by diabetes (diabetes mellitus). Over time, people with diabetes can develop nerve damage throughout the body. There are several types of diabetic neuropathy: °· Peripheral neuropathy. This is the most common type of diabetic neuropathy. It causes damage to nerves that carry signals between the spinal cord and other parts of the body (peripheral nerves). This usually affects nerves in the feet and legs first, and may eventually affect the hands and arms. The damage affects the ability to sense touch or temperature. °· Autonomic neuropathy. This type causes damage to nerves that control involuntary functions (autonomic nerves). These nerves carry signals that control: °? Heartbeat. °? Body temperature. °? Blood pressure. °? Urination. °? Digestion. °? Sweating. °? Sexual function. °? Response to changing blood sugar (glucose) levels. °· Focal neuropathy. This type of nerve damage affects one area of the body, such as an arm, a leg, or the face. The injury may involve one nerve or a small group of nerves. Focal neuropathy can be painful and unpredictable, and occurs most often in older adults with diabetes. This often develops suddenly, but usually improves over time  and does not cause long-term problems. °· Proximal neuropathy. This type of nerve damage affects the nerves of the thighs, hips, buttocks, or legs. It causes severe pain, weakness, and muscle death (atrophy), usually in the thigh muscles. It is more common among older men and people who have type 2 diabetes. The length of recovery time may vary. °What are the causes? °Peripheral, autonomic, and focal neuropathies are caused by diabetes that is not well controlled with treatment. The cause of proximal neuropathy is not known, but it may be caused by inflammation related to uncontrolled blood glucose levels. °What are the signs or symptoms? °Peripheral neuropathy °Peripheral neuropathy develops slowly over time. When the nerves of the feet and legs no longer work, you may experience: °· Burning, stabbing, or aching pain in the legs or feet. °· Pain or cramping in the   legs or feet. °· Loss of feeling (numbness) and inability to feel pressure or pain in the feet. This can lead to: °? Thick calluses or sores on areas of constant pressure. °? Ulcers. °? Reduced ability to feel temperature changes. °· Foot deformities. °· Muscle weakness. °· Loss of balance or coordination. °Autonomic neuropathy °The symptoms of autonomic neuropathy vary depending on which nerves are affected. Symptoms may include: °· Problems with digestion, such as: °? Nausea or vomiting. °? Poor appetite. °? Bloating. °? Diarrhea or constipation. °? Trouble swallowing. °? Losing weight without trying to. °· Problems with the heart, blood and lungs, such as: °? Dizziness, especially when standing up. °? Fainting. °? Shortness of breath. °? Irregular heartbeat. °· Bladder problems, such as: °? Trouble starting or stopping urination. °? Leaking urine. °? Trouble emptying the bladder. °? Urinary tract infections (UTIs). °· Problems with other body functions, such as: °? Sweat. You may sweat too much or too little. °? Temperature. You might get hot easily.  Or, you might feel cold more than usual. °? Sexual function. Men may not be able to get or maintain an erection. Women may have vaginal dryness and difficulty with arousal. °Focal neuropathy °Symptoms affect only one area of the body. Common symptoms include: °· Numbness. °· Tingling. °· Burning pain. °· Prickling feeling. °· Very sensitive skin. °· Weakness. °· Inability to move (paralysis). °· Muscle twitching. °· Muscles getting smaller (wasting). °· Poor coordination. °· Double or blurred vision. °Proximal neuropathy °· Sudden, severe pain in the hip, thigh, or buttocks. Pain may spread from the back into the legs (sciatica). °· Pain and numbness in the arms and legs. °· Tingling. °· Loss of bladder control or bowel control. °· Weakness and wasting of thigh muscles. °· Difficulty getting up from a seated position. °· Abdominal swelling. °· Unexplained weight loss. °How is this diagnosed? °Diagnosis usually involves reviewing your medical history and any symptoms you have. Diagnosis varies depending on the type of neuropathy your health care provider suspects. °Peripheral neuropathy °Your health care provider will check areas that are affected by your nervous system (neurologic exam), such as your reflexes, how you move, and what you can feel. You may have other tests, such as: °· Blood tests. °· Removal and examination of fluid that surrounds the spinal cord (lumbar puncture). °· CT scan. °· MRI. °· A test to check the nerves that control muscles (electromyogram, EMG). °· Tests of how quickly messages pass through your nerves (nerve conduction velocity tests). °· Removal of a small piece of nerve to be examined under a microscope (biopsy). °Autonomic neuropathy °You may have tests, such as: °· Tests to measure your blood pressure and heart rate. This may include monitoring you while you are safely secured to an exam table that moves you from a lying position to an upright position (table tilt test). °· Breathing  tests to check your lungs. °· Tests to check how food moves through the digestive system (gastric emptying tests). °· Blood, sweat, or urine tests. °· Ultrasound of your bladder. °· Spinal fluid tests. °Focal neuropathy °This condition may be diagnosed with: °· A neurologic exam. °· CT scan. °· MRI. °· EMG. °· Nerve conduction velocity tests. °Proximal neuropathy °There is no test to diagnose this type of neuropathy. You may have tests to rule out other possible causes of this type of neuropathy. Tests may include: °· X-rays of your spine and lumbar region. °· Lumbar puncture. °· MRI. °How is this treated? °The   goal of treatment is to keep nerve damage from getting worse. The most important part of treatment is keeping your blood glucose level and your A1C level within your target range by following your diabetes management plan. Over time, maintaining lower blood glucose levels helps lessen symptoms. In some cases, you may need prescription pain medicine. °Follow these instructions at home: ° °Lifestyle ° °· Do not use any products that contain nicotine or tobacco, such as cigarettes and e-cigarettes. If you need help quitting, ask your health care provider. °· Be physically active every day. Include strength training and balance exercises. °· Follow a healthy meal plan. °· Work with your health care provider to manage your blood pressure. °General instructions °· Follow your diabetes management plan as directed. °? Check your blood glucose levels as directed by your health care provider. °? Keep your blood glucose in your target range as directed by your health care provider. °? Have your A1C level checked at least two times a year, or as often as told by your health care provider. °· Take over the counter and prescription medicines only as told by your health care provider. This includes insulin and diabetes medicine. °· Do not drive or use heavy machinery while taking prescription pain medicines. °· Check your  skin and feet every day for cuts, bruises, redness, blisters, or sores. °· Keep all follow up visits as told by your health care provider. This is important. °Contact a health care provider if: °· You have burning, stabbing, or aching pain in your legs or feet. °· You are unable to feel pressure or pain in your feet. °· You develop problems with digestion, such as: °? Nausea. °? Vomiting. °? Bloating. °? Constipation. °? Diarrhea. °? Abdominal pain. °· You have difficulty with urination, such as inability: °? To control when you urinate (incontinence). °? To completely empty the bladder (retention). °· You have palpitations. °· You feel dizzy, weak, or faint when you stand up. °Get help right away if: °· You cannot urinate. °· You have sudden weakness or loss of coordination. °· You have trouble speaking. °· You have pain or pressure in your chest. °· You have an irregular heart beat. °· You have sudden inability to move a part of your body. °Summary °· Diabetic neuropathy refers to nerve damage that is caused by diabetes. It can affect nerves throughout the entire body, causing numbness and pain in the arms, legs, digestive tract, heart, and other body systems. °· Keep your blood glucose level and your blood pressure in your target range, as directed by your health care provider. This can help prevent neuropathy from getting worse. °· Check your skin and feet every day for cuts, bruises, redness, blisters, or sores. °· Do not use any products that contain nicotine or tobacco, such as cigarettes and e-cigarettes. If you need help quitting, ask your health care provider. °This information is not intended to replace advice given to you by your health care provider. Make sure you discuss any questions you have with your health care provider. °Document Released: 02/13/2002 Document Revised: 01/17/2018 Document Reviewed: 01/09/2017 °Elsevier Interactive Patient Education © 2019 [employer]. ° °

## 2019-05-21 ENCOUNTER — Ambulatory Visit: Payer: Medicare Other | Admitting: Podiatry

## 2019-05-23 MED FILL — LATANOPROST 0.005% EYE DRP: 0.005 | 25 days supply | Qty: 3 | Fill #4

## 2019-05-23 MED FILL — ACCU-CHEK AVIVA PLUS TEST S: 25 days supply | Qty: 100 | Fill #2

## 2019-05-25 NOTE — Progress Notes (Signed)
Subjective:  Amanda [name] presents for preventative diabetic foot care to clinic today with cc of  painful, thick, discolored, elongated toenails 1-5 b/l that become tender and cannot cut because of thickness.  Pain is aggravated when wearing enclosed shoe gear.  [name], MD is her PCP.   She states her blood sugar was 110 mg/dl this morning.   Current Outpatient Medications:  .  ACCU-CHEK SOFTCLIX LANCETS lancets, Check blood sugar 4x/day before meal an bedtime, Disp: 200 each, Rfl: 7 .  acetaminophen (TYLENOL) 325 MG tablet, Take 2 tablets (650 mg total) by mouth every 6 (six) hours as needed (or Fever >/= 101)., Disp: 60 tablet, Rfl: 0 .  aspirin EC 81 MG tablet, Take 81 mg by mouth daily., Disp: , Rfl:  .  atorvastatin (LIPITOR) 40 MG tablet, Take 1 tablet (40 mg total) by mouth daily., Disp: 90 tablet, Rfl: 3 .  benzonatate (TESSALON) 100 MG capsule, Take 1 capsule (100 mg total) by mouth every 8 (eight) hours. (Patient taking differently: Take 100 mg by mouth every 8 (eight) hours as needed. ), Disp: 21 capsule, Rfl: 0 .  Capsaicin-Menthol-Methyl Sal (CAPSAICIN-METHYL SAL-MENTHOL) 0.025-1-12 % CREA, Apply 1 application topically as needed. Apply over painful areas, Disp: 1 Tube, Rfl: 1 .  cetirizine (ZYRTEC) 5 MG tablet, Take 1 tablet (5 mg total) by mouth daily., Disp: 30 tablet, Rfl: 1 .  Cholecalciferol (VITAMIN D3) 2000 units capsule, Take by mouth daily. , Disp: , Rfl:  .  Continuous Blood Gluc Sensor (FREESTYLE LIBRE 14 DAY SENSOR) MISC, 1 each by Does not apply route 4 (four) times daily., Disp: 2 each, Rfl: 12 .  fluticasone (FLONASE) 50 MCG/ACT nasal spray, Place 2 sprays into both nostrils daily., Disp: 16 g, Rfl: 2 .  glucose blood (ACCU-CHEK AVIVA PLUS) test strip, Check blood sugar 4x/day before all meals and bedtime, Disp: 125 each, Rfl: 12 .  latanoprost (XALATAN) 0.005 % ophthalmic solution, 1 drop at bedtime., Disp: , Rfl:  .  lisinopril (PRINIVIL,ZESTRIL)  20 MG tablet, Take 1 tablet (20 mg total) by mouth daily., Disp: 90 tablet, Rfl: 0 .  metFORMIN (GLUCOPHAGE-XR) 500 MG 24 hr tablet, Take 2 tablets (1,000 mg total) by mouth 2 (two) times daily., Disp: 360 tablet, Rfl: 2 .  Multiple Vitamins-Minerals (WOMENS 50+ MULTI VITAMIN/MIN) TABS, Take 1 tablet by mouth daily., Disp: 30 tablet, Rfl:  .  Semaglutide (OZEMPIC, 0.25 OR 0.5 MG/DOSE, Dinuba), Inject 0.25 mg into the skin. Inject 0.25mg  weekly for four weeks then increase to 0.5mg  weekly, Disp: , Rfl:    Allergies  Allergen Reactions  . Gabapentin Other (See Comments)    Pt states medication made her feel "out of it".   Pt states medication made her feel "out of it".       Objective: Vitals:   05/20/19 1043  Temp: (!) 97.3 F (36.3 C)    Physical Examination:  Vascular Examination: Capillary refill time <3 seconds x 10 digits.  Palpable DP/PT pulses b/l.  Digital hair present b/l.  No edema noted b/l.  Skin temperature gradient WNL b/l.  Dermatological Examination: Skin with normal turgor, texture and tone b/l.  No open wounds b/l.  No interdigital macerations noted b/l.  Elongated, thick, discolored brittle toenails with subungual debris and pain on dorsal palpation of nailbeds 1-5 b/l.  Musculoskeletal Examination: Muscle strength 5/5 to all muscle groups b/l  No pain, crepitus or joint discomfort with active/passive ROM.  Neurological Examination: Sensation intact 5/5  b/l with 10 gram monofilament.  Vibratory sensation intact b/l.  Proprioceptive sensation intact b/l.  Assessment: Mycotic nail infection with pain 1-5 b/l  Plan: 1. Continue diabetic foot care principles. Literature dispensed on today. 2. Toenails 1-5 b/l were debrided in length and girth without iatrogenic laceration. 3. Continue soft, supportive shoe gear daily. 4. Report any pedal injuries to medical professional. 5. Follow up 3 months. 6. Patient/POA to call should there be a  question/concern in there interim.

## 2019-05-28 ENCOUNTER — Ambulatory Visit: Payer: Medicare Other | Admitting: Pharmacist

## 2019-05-28 ENCOUNTER — Other Ambulatory Visit: Payer: Self-pay

## 2019-05-28 DIAGNOSIS — E118 Type 2 diabetes mellitus with unspecified complications: Secondary | ICD-10-CM

## 2019-05-28 DIAGNOSIS — Z794 Long term (current) use of insulin: Secondary | ICD-10-CM

## 2019-05-28 NOTE — Patient Outreach (Signed)
[facility]) Care Management  05/28/2019  Amanda [name] Oct 01, 1951 [id number]   Telephone Screen  Referral Date: 05/28/2019 Referral Source: [facility] Office Referral Reason: " medication cost help-Ozempic" Insurance: [facility] Medicare   Outreach attempt # 1 to patient. Spoke with patient who voices she is doing well. She denies any acute issues or concerns. She resides in her home with spouse. She is independent with ADLs/IADLs. She voices no recent falls. Per chart  Review, patient has PMH of DM, arthritis,HLD and HTN. She voices that she has been a diabetic since 1996. Patient reports she is monitoring cbgs at home and they are ranging in the low 100s to 150s.She reports that she is knowledgeable regarding conditions and doing what she needs to do to stay healthy. Patient states that she has been working with pharmacist at MD office regarding her diabetic meds. She was taking Victoza up until about 4-6wks ago. She has since been started on Ozempic and picked up two boxes of samples from MD office today. She states that she does not know how much med will cost at pharmacy. However, Victoza and all other diabetic meds have cost her about $400 or more and she expects the same with this med. She would like assistance with affording med She is agreeable to referral to pharmacy dept for possible assistance. She denies any further [facility] needs or concerns at this time.      Plan: RN CM will send [facility] pharmacy referral for possible med assistance.   Enzo Montgomery, RN,BSN,CCM [facility] Telephonic Care Management Coordinator Direct Phone: [phone] Toll Free: [phone] Fax: [phone]

## 2019-05-28 NOTE — Progress Notes (Addendum)
S: Amanda [name] is a 68 y.o. female reports to clinical pharmacist appointment for diabetes medication management  Allergies  Allergen Reactions  . Gabapentin Other (See Comments)    Pt states medication made her feel "out of it".   Pt states medication made her feel "out of it".      Current Outpatient Medications:  .  ACCU-CHEK SOFTCLIX LANCETS lancets, Check blood sugar 4x/day before meal an bedtime, Disp: 200 each, Rfl: 7 .  acetaminophen (TYLENOL) 325 MG tablet, Take 2 tablets (650 mg total) by mouth every 6 (six) hours as needed (or Fever >/= 101)., Disp: 60 tablet, Rfl: 0 .  aspirin EC 81 MG tablet, Take 81 mg by mouth daily., Disp: , Rfl:  .  atorvastatin (LIPITOR) 40 MG tablet, Take 1 tablet (40 mg total) by mouth daily., Disp: 90 tablet, Rfl: 3 .  benzonatate (TESSALON) 100 MG capsule, Take 1 capsule (100 mg total) by mouth every 8 (eight) hours. (Patient taking differently: Take 100 mg by mouth every 8 (eight) hours as needed. ), Disp: 21 capsule, Rfl: 0 .  Capsaicin-Menthol-Methyl Sal (CAPSAICIN-METHYL SAL-MENTHOL) 0.025-1-12 % CREA, Apply 1 application topically as needed. Apply over painful areas, Disp: 1 Tube, Rfl: 1 .  cetirizine (ZYRTEC) 5 MG tablet, Take 1 tablet (5 mg total) by mouth daily., Disp: 30 tablet, Rfl: 1 .  Cholecalciferol (VITAMIN D3) 2000 units capsule, Take by mouth daily. , Disp: , Rfl:  .  Continuous Blood Gluc Sensor (FREESTYLE LIBRE 14 DAY SENSOR) MISC, 1 each by Does not apply route 4 (four) times daily., Disp: 2 each, Rfl: 12 .  fluticasone (FLONASE) 50 MCG/ACT nasal spray, Place 2 sprays into both nostrils daily., Disp: 16 g, Rfl: 2 .  glucose blood (ACCU-CHEK AVIVA PLUS) test strip, Check blood sugar 4x/day before all meals and bedtime, Disp: 125 each, Rfl: 12 .  latanoprost (XALATAN) 0.005 % ophthalmic solution, 1 drop at bedtime., Disp: , Rfl:  .  lisinopril (PRINIVIL,ZESTRIL) 20 MG tablet, Take 1 tablet (20 mg total) by mouth daily., Disp: 90  tablet, Rfl: 0 .  metFORMIN (GLUCOPHAGE-XR) 500 MG 24 hr tablet, Take 2 tablets (1,000 mg total) by mouth 2 (two) times daily., Disp: 360 tablet, Rfl: 2 .  Multiple Vitamins-Minerals (WOMENS 50+ MULTI VITAMIN/MIN) TABS, Take 1 tablet by mouth daily., Disp: 30 tablet, Rfl:  .  Semaglutide (OZEMPIC, 0.25 OR 0.5 MG/DOSE, [place]), Inject 0.25 mg into the skin. Inject 0.25mg  weekly for four weeks then increase to 0.5mg  weekly, Disp: , Rfl:  Past Medical History:  Diagnosis Date  . Allergy    SEASONAL  . Anemia   . Arthritis   . Cataract    PRESENT IN LEFT/ CATARACTS REMOVED FROM RIGHT  . Diabetes mellitus 1996  . DKA (diabetic ketoacidoses) ([place]) 01/02/2013  . Hyperlipidemia   . Hypertension   . Hypotension   . Influenza A (H1N1) 01/03/2013  . Neuromuscular disorder ([place])    NEUROPATHY  . SYNCOPE 05/10/2010   Qualifier: Diagnosis of  By: Marilynne Halsted RN, BSN, Jacquelyn     Social History   Socioeconomic History  . Marital status: Married    Spouse name: Not on file  . Number of children: 1  . Years of education: Not on file  . Highest education level: Not on file  Occupational History  . Occupation: retired  Scientific laboratory technician  . Financial resource strain: Not on file  . Food insecurity:    Worry: Not on file    Inability:  Not on file  . Transportation needs:    Medical: Not on file    Non-medical: Not on file  Tobacco Use  . Smoking status: Never Smoker  . Smokeless tobacco: Never Used  Substance and Sexual Activity  . Alcohol use: No    Alcohol/week: 0.0 standard drinks  . Drug use: No  . Sexual activity: Not on file  Lifestyle  . Physical activity:    Days per week: Not on file    Minutes per session: Not on file  . Stress: Not on file  Relationships  . Social connections:    Talks on phone: Not on file    Gets together: Not on file    Attends religious service: Not on file    Active member of club or organization: Not on file    Attends meetings of clubs or organizations: Not  on file    Relationship status: Not on file  Other Topics Concern  . Not on file  Social History Narrative  . Not on file   Family History  Problem Relation Age of Onset  . Diabetes Mother   . Hypertension Mother   . Breast cancer Mother   . Aneurysm Father   . Diabetes Sister   . Hypertension Sister   . Kidney disease Sister        One Kidney transplant, one sister on HD  . Breast cancer Sister   . Colon cancer Neg Hx   . Esophageal cancer Neg Hx    O:    Component Value Date/Time   CHOL 158 01/29/2018 1648   HDL 68 01/29/2018 1648   LDLCALC 80 01/29/2018 1648   TRIG 51 01/29/2018 1648   GLUCOSE 110 (H) 12/21/2018 1102   GLUCOSE 279 (H) 07/23/2018 1450   HGBA1C 7.9 (A) 10/08/2018 1541   HGBA1C >14.0 (A) 07/23/2018 1411   NA 143 12/21/2018 1102   K 4.6 12/21/2018 1102   CL 109 (H) 12/21/2018 1102   CO2 21 12/21/2018 1102   BUN 26 12/21/2018 1102   CREATININE 1.07 (H) 12/21/2018 1102   CREATININE 0.97 06/16/2015 1553   CALCIUM 9.2 12/21/2018 1102   GFRNONAA 54 (L) 12/21/2018 1102   GFRNONAA 63 06/16/2015 1553   GFRAA 63 12/21/2018 1102   GFRAA 72 06/16/2015 1553   AST 11 12/25/2017 1426   ALT 7 12/25/2017 1426   WBC 5.7 12/21/2018 1102   WBC 5.4 12/25/2017 1426   HGB 10.0 (L) 12/21/2018 1102   HCT 30.3 (L) 12/21/2018 1102   PLT 358 12/21/2018 1102   TSH 1.520 11/24/2015 1543   Ht Readings from Last 2 Encounters:  02/06/19 5\' 1"  (1.549 m)  01/02/19 5\' 1"  (1.549 m)   Wt Readings from Last 2 Encounters:  02/06/19 124 lb 3.2 oz (56.3 kg)  01/02/19 130 lb 3.2 oz (59.1 kg)   There is no height or weight on file to calculate BMI. BP Readings from Last 3 Encounters:  02/27/19 (!) 142/79  02/25/19 (!) 198/106  02/06/19 (!) 152/78    A/P: Patient reports BG at home in the 100s, no signs or symptoms of hyper- or hypoglycemia. She requests help with Ozempic affordability, referral placed to [facility] for assistance and explained to patient they will contact  her.  Medication Samples have been provided to the patient. Drug name: Ozempic       Strength: 0.5 mg        Qty: 1  LOT: [id number]  Exp.Date: 03/2021  Dosing instructions: inject  0.5 mg into skin once weekly  The patient has been instructed regarding the correct time, dose, and frequency of taking this medication, including desired effects and most common side effects.   Flossie Dibble 12:27 PM 05/28/2019  Did not order labs or other assessment today, as patient reports doing well and DM control is fair, patient is due for PCP visit. Appointment scheduled with PCP for 06/03/2019.  An after visit summary was provided and patient advised to follow up if any changes in condition or questions regarding medications arise.   The patient verbalized understanding of information provided by repeating back concepts discussed.   15 minutes spent face-to-face with the patient during the encounter. 50% of time spent on education. 50% of time was spent on assessment, plan, and coordination of care.

## 2019-05-29 ENCOUNTER — Telehealth: Payer: Self-pay | Admitting: Pharmacist

## 2019-05-29 NOTE — Patient Outreach (Signed)
[facility]) Care Management  [place]   05/29/2019  Amanda [name] 21-Sep-1951 [id number]  Reason for referral: Medication Assistance  Referral source: [facility] RN Current insurance: [facility]  PMHx includes but not limited to:   Constipation, type 2 diabetes with secondary neuropathy, hyperlipidemia and seasonal allergies.  Outreach:  Successful telephone call with patient.  HIPAA identifiers verified.   Subjective:  Patient reports feeling fine and taking her medications as prescribed.  She reported Ozpemic as being expensive but she has never had   Objective: The 10-year ASCVD risk score Mikey Bussing DC Jr., et al., 2013) is: 21.4%   Values used to calculate the score:     Age: 68 years     Sex: Female     Is Non-Hispanic African American: Yes     Diabetic: Yes     Tobacco smoker: No     Systolic Blood Pressure: 703 mmHg     Is BP treated: Yes     HDL Cholesterol: 68 mg/dL     Total Cholesterol: 158 mg/dL  Lab Results  Component Value Date   CREATININE 1.07 (H) 12/21/2018   CREATININE 1.50 (H) 07/23/2018   CREATININE 1.17 12/25/2017    Lab Results  Component Value Date   HGBA1C 7.9 (A) 10/08/2018    Lipid Panel     Component Value Date/Time   CHOL 158 01/29/2018 1648   TRIG 51 01/29/2018 1648   HDL 68 01/29/2018 1648   CHOLHDL 2.3 01/29/2018 1648   CHOLHDL 2.5 03/18/2014 0908   VLDL 9 03/18/2014 0908   LDLCALC 80 01/29/2018 1648    BP Readings from Last 3 Encounters:  02/27/19 (!) 142/79  02/25/19 (!) 198/106  02/06/19 (!) 152/78    Allergies  Allergen Reactions  . Gabapentin Other (See Comments)    Pt states medication made her feel "out of it".   Pt states medication made her feel "out of it".      Medications Reviewed Today    Reviewed by [name], [facility] (Pharmacist) on 05/29/19 at 1514  Med List Status: <None>  Medication Order Taking? Sig Documenting Provider Last Dose Status Informant  ACCU-CHEK  SOFTCLIX LANCETS lancets [id number] Yes Check blood sugar 4x/day before meal an bedtime Lorella Nimrod, MD Taking Active   acetaminophen (TYLENOL) 325 MG tablet [id number] Yes Take 2 tablets (650 mg total) by mouth every 6 (six) hours as needed (or Fever >/= 101). Dorian Heckle, MD Taking Active Self  aspirin EC 81 MG tablet [id number] Yes Take 81 mg by mouth daily. [provider] Taking Active   atorvastatin (LIPITOR) 40 MG tablet [id number] Yes Take 1 tablet (40 mg total) by mouth daily. Forde Dandy, [name] Taking Active   benzonatate (TESSALON) 100 MG capsule [id number] Yes Take 1 capsule (100 mg total) by mouth every 8 (eight) hours.  Patient taking differently:  Take 100 mg by mouth every 8 (eight) hours as needed.    Loura Halt A, NP Taking Active   Capsaicin-Menthol-Methyl Sal (CAPSAICIN-METHYL SAL-MENTHOL) 0.025-1-12 % CREA [id number] Yes Apply 1 application topically as needed. Apply over painful areas Mosetta Anis, MD Taking Active   cetirizine (ZYRTEC) 5 MG tablet [id number] Yes Take 1 tablet (5 mg total) by mouth daily. [name], MD Taking Active   Cholecalciferol (VITAMIN D3) 2000 units capsule [id number] Yes Take by mouth daily.  [provider] Taking Active            Med  Note (DAVIS, SOPHIA A   Fri Mar 02, 2018  2:06 PM)    Continuous Blood Gluc Sensor (FREESTYLE LIBRE 14 DAY SENSOR) [place] [id number] Yes 1 each by Does not apply route 4 (four) times daily. Lorella Nimrod, MD Taking Active   fluticasone [facility]) 50 MCG/ACT nasal spray [id number] Yes Place 2 sprays into both nostrils daily. [name], MD Taking Active   glucose blood (ACCU-CHEK AVIVA PLUS) test strip [id number] Yes Check blood sugar 4x/day before all meals and bedtime Lorella Nimrod, MD Taking Active   latanoprost (XALATAN) 0.005 % ophthalmic solution [id number] Yes 1 drop at bedtime. [provider] Taking Active   lisinopril (PRINIVIL,ZESTRIL) 20 MG tablet [id number] Yes Take 1  tablet (20 mg total) by mouth daily. Mosetta Anis, MD Taking Active   metFORMIN (GLUCOPHAGE-XR) 500 MG 24 hr tablet [id number] Yes Take 2 tablets (1,000 mg total) by mouth 2 (two) times daily. Lorella Nimrod, MD Taking Active   Multiple Vitamins-Minerals (WOMENS 50+ [place] VITAMIN/MIN) TABS [id number] Yes Take 1 tablet by mouth daily. Lucious Groves, DO Taking Active Self  Semaglutide (OZEMPIC, 0.25 OR 0.5 MG/DOSE, [place]) [id number] Yes Inject 0.25 mg into the skin. Inject 0.56m weekly for four weeks then increase to 0.547mweekly [provider] Taking Active           Assessment: Drugs sorted by system:  Cardiovascular:  Aspirin, Atorvastatin, Cetirizine, Lisinopril,   Pulmonary/Allergy: Benzonatate, Fluticasone,   Endocrine: Metformin, Ozempic  Topical: Capsaicin-methyl-Menthol  Pain: Acetaminophen  Vitamins/Minerals/Supplements: Cholecalciferol, Multiple Vitamin  Miscellaneous: Latanoprost,  Medication Assistance Findings:  Medication assistance needs identified: OzLyonatient has a $95 deductible she has not met. If she had a 90 day supply of Ozempic filled her copay would be $220 a 30 day supply would be $124.  Extra Help:  May be eligible for Full or Partial Extra Help Low Income Subsidy based on reported income and assets  Patient Assistance Programs: Ozempic made by NoTallasseeequirement met: Yes o Out-of-pocket prescription expenditure met:   Not Applicable - Patient has met application requirements to apply for this patient assistance program.     Additional medication assistance options reviewed with patient as warranted:  No other options identified    Plan: . Assisted patient with applying for Extra Help.  . I will route patient assistance letter to THLewisportechnician who will coordinate patient assistance program application process for medications listed above.  THThree Gables Surgery Centerharmacy technician will assist  with obtaining all required documents from both patient and provider(s) and submit application(s) once completed.  . Will route note to PCP.  . [name] Kitchenill follow-up in 1 month.   KaElayne GuerinPharmD, BCClemmonslinical Pharmacist ([phone]

## 2019-05-30 ENCOUNTER — Other Ambulatory Visit: Payer: Self-pay | Admitting: Pharmacy Technician

## 2019-05-30 NOTE — Patient Outreach (Signed)
[place] [facility]) Care Management  05/30/2019  CHALONDA SCHLATTER [birth year] [id number]                           Medication Assistance Referral  Referral From: [place]  Medication/Company: [employer] / [employer] Patient application portion:  Education officer, museum portion: Faxed  to Dr. Guadlupe Spanish    Follow up:  Will follow up with patient in 5-10 business days to confirm application(s) have been received.  Octavio Matheney P. Jheremy Boger, [place] Management [phone]

## 2019-06-03 ENCOUNTER — Encounter: Payer: Medicare Other | Admitting: Internal Medicine

## 2019-06-06 ENCOUNTER — Telehealth: Payer: Self-pay

## 2019-06-06 NOTE — Telephone Encounter (Signed)
ERROR

## 2019-06-12 ENCOUNTER — Other Ambulatory Visit: Payer: Self-pay | Admitting: Pharmacy Technician

## 2019-06-12 NOTE — Patient Outreach (Signed)
[name] Care Management  06/12/2019  Amanda [name] 06-07-1951 [id number]   Unsuccessful outreach call placed to patient in regards to [employer] application for [employer].  Unfortunately patient did not answer the phone, HIPAA compliant voicemail left.  Was calling patient to inquire if she received the patient assistance application that was mailed to her on 05/30/2019.  Will followup with patient in 3-7 business days if call is not returned.  [name] P. Michaiah Holsopple, [place] Management [phone]

## 2019-06-13 ENCOUNTER — Other Ambulatory Visit: Payer: Self-pay | Admitting: Internal Medicine

## 2019-06-13 DIAGNOSIS — E118 Type 2 diabetes mellitus with unspecified complications: Secondary | ICD-10-CM

## 2019-06-13 DIAGNOSIS — Z794 Long term (current) use of insulin: Secondary | ICD-10-CM

## 2019-06-13 MED FILL — ATORVASTATIN 40 MG TABLET: 40 | 90 days supply | Qty: 90 | Fill #1

## 2019-06-13 NOTE — Telephone Encounter (Signed)
Refill appropriate 

## 2019-06-14 MED FILL — LISINOPRIL 20 MG TABLET: 20 | 90 days supply | Qty: 90 | Fill #0

## 2019-06-14 MED FILL — metFORMIN HCL ER 500 MG TB2: 500 | 90 days supply | Qty: 360 | Fill #0

## 2019-06-14 NOTE — Telephone Encounter (Signed)
refilled 

## 2019-06-17 ENCOUNTER — Other Ambulatory Visit: Payer: Self-pay | Admitting: Pharmacy Technician

## 2019-06-17 NOTE — Patient Outreach (Signed)
[facility]) Care Management  06/17/2019  Amanda [name] 10/03/1951 [id number]   Successful outgoing call placed to patient in regards to [employer] application for [place].  Spoke to patient, HIPAA identifiers verified.  Patient informed she has not received the application and she checks mail daily. Informed patient that another application would be placed in the mail on 06/18/2019 when back in the office. Patient was agreeable.  Will followup with patient in 5-7 business days to inquire if she has received the 2nd application.  [name] P. Bevin Mayall, [place] Management [phone]

## 2019-06-18 ENCOUNTER — Other Ambulatory Visit: Payer: Self-pay | Admitting: Pharmacy Technician

## 2019-06-18 NOTE — Patient Outreach (Signed)
[facility]) Care Management  06/18/2019  Amanda [name] February 09, 1951 [id number]    Error.  [name] P. Demontray Franta, [place] Management [phone]

## 2019-06-27 ENCOUNTER — Other Ambulatory Visit: Payer: Self-pay | Admitting: Pharmacy Technician

## 2019-06-27 NOTE — Patient Outreach (Signed)
[place] [facility]) Care Management  06/27/2019  Amanda [name] 1951-06-11 [id number]    Unsuccessful outreach call placed to patient in regards to [employer] application for [employer].  Unfortunately patient did not answer the phone, HIPAA compliant voicemail left.  Was calling patient to inquire if she had received the 2nd application that was mailed out on 06/18/2019.  Will followup with patient in 3-7 business days if call is not returned.  Coltan Spinello P. Dayanna Pryce, [place] Management [phone]

## 2019-07-01 ENCOUNTER — Ambulatory Visit: Payer: Medicare Other | Admitting: Pharmacist

## 2019-07-02 ENCOUNTER — Encounter: Payer: Self-pay | Admitting: Pharmacist

## 2019-07-02 NOTE — Progress Notes (Signed)
Medication Samples have been coordinated for patient.  Drug name: dulaglutide (Trulicity)       Strength: 1.5 mg        Qty: 2 boxes (4 pens)  LOT: [id number]  Exp.Date: 06/2020  Dosing instructions: 1.5 mg weekly  The patient has been instructed regarding the correct time, dose, and frequency of taking this medication, including desired effects and most common side effects.   Amanda [name] 7:45 AM 07/02/2019

## 2019-07-08 ENCOUNTER — Other Ambulatory Visit: Payer: Self-pay | Admitting: Pharmacy Technician

## 2019-07-08 NOTE — Patient Outreach (Signed)
[facility] [facility]) Care Management  07/08/2019  Amanda [name] [birth year] [id number]  Successful outreach call (4th overall call) placed to patient in regards to [employer] application for [place].  Spoke to patient, HIPAA identifiers verified.  Patient informed she did receive the 2nd application that was mailed to her. She informed she has not completed the application yet. She informed she was going to make it a priority today to fill it out and mail it back in.  Will followup with last outreach attempt in 7-10 business days.  Kimley Apsey P. Frazer [place], [place] Management [phone]

## 2019-07-09 ENCOUNTER — Encounter: Payer: Medicare Other | Admitting: Internal Medicine

## 2019-07-15 ENCOUNTER — Ambulatory Visit: Payer: Self-pay | Admitting: Pharmacist

## 2019-07-18 ENCOUNTER — Other Ambulatory Visit: Payer: Self-pay | Admitting: Pharmacy Technician

## 2019-07-18 NOTE — Patient Outreach (Signed)
[facility]) Care Management  07/18/2019  Amanda [name] 1951-11-25 [id number]    Successful outreach call placed to patient in regards to [employer] application for [place].  Spoke to patient, HIPAA identifiers verified.  Patient informed she had mailed the application back on 01/10/7989.  Will followup with patient in 10-14 business days if application has not been received.  [name] P. Juvon Teater, [place] Management [phone]

## 2019-07-22 NOTE — Progress Notes (Deleted)
CC: ***  HPI:  Amanda [name] is a 68 y.o. female with PMH below.  Today we will address T2DM,   T2DM:  Lab Results  Component Value Date   HGBA1C 7.9 (A) 10/08/2018   Currently taking metformin 1000mg  BID, Ozempic 0.5mg  weekly.  She does not want to go on insulin.  Given her age I think if she stays in an A1C between 7 and 8 favoring closer to 7 this will be fine.  Not a good candidate for SGLT-2 given orthostatic hypotension, very low body weight.   Normocytic anemia:  has had to take iron in the past years ago doesn't remember when.  Ferritin still within the normal range and no known chronic inflammatory state to explain a higher value.  Had recent colonoscopy which was normal. Up to date on mammo and pap.  She is a never smoker.  Does report less energy over the last few years.    -will get spep, upep IFE, kappa/lambda, LDH, CMP, CBC, iron profile, reticulocyte count, urinalysis   Current Outpatient Medications:  .  ACCU-CHEK SOFTCLIX LANCETS lancets, Check blood sugar 4x/day before meal an bedtime, Disp: 200 each, Rfl: 7 .  acetaminophen (TYLENOL) 325 MG tablet, Take 2 tablets (650 mg total) by mouth every 6 (six) hours as needed (or Fever >/= 101)., Disp: 60 tablet, Rfl: 0 .  aspirin EC 81 MG tablet, Take 81 mg by mouth daily., Disp: , Rfl:  .  atorvastatin (LIPITOR) 40 MG tablet, Take 1 tablet (40 mg total) by mouth daily., Disp: 90 tablet, Rfl: 3 .  benzonatate (TESSALON) 100 MG capsule, Take 1 capsule (100 mg total) by mouth every 8 (eight) hours. (Patient taking differently: Take 100 mg by mouth every 8 (eight) hours as needed. ), Disp: 21 capsule, Rfl: 0 .  Capsaicin-Menthol-Methyl Sal (CAPSAICIN-METHYL SAL-MENTHOL) 0.025-1-12 % CREA, Apply 1 application topically as needed. Apply over painful areas, Disp: 1 Tube, Rfl: 1 .  cetirizine (ZYRTEC) 5 MG tablet, Take 1 tablet (5 mg total) by mouth daily., Disp: 30 tablet, Rfl: 1 .  Cholecalciferol (VITAMIN D3) 2000 units  capsule, Take by mouth daily. , Disp: , Rfl:  .  Continuous Blood Gluc Sensor (FREESTYLE LIBRE 14 DAY SENSOR) MISC, 1 each by Does not apply route 4 (four) times daily., Disp: 2 each, Rfl: 12 .  fluticasone (FLONASE) 50 MCG/ACT nasal spray, Place 2 sprays into both nostrils daily., Disp: 16 g, Rfl: 2 .  glucose blood (ACCU-CHEK AVIVA PLUS) test strip, Check blood sugar 4x/day before all meals and bedtime, Disp: 125 each, Rfl: 12 .  latanoprost (XALATAN) 0.005 % ophthalmic solution, 1 drop at bedtime., Disp: , Rfl:  .  lisinopril (ZESTRIL) 20 MG tablet, TAKE 1 TABLET (20 MG TOTAL) BY MOUTH DAILY., Disp: 90 tablet, Rfl: 1 .  metFORMIN (GLUCOPHAGE-XR) 500 MG 24 hr tablet, TAKE 2 TABLETS BY MOUTH 2 TIMES DAILY WITH A MEAL., Disp: 360 tablet, Rfl: 3 .  Multiple Vitamins-Minerals (WOMENS 50+ MULTI VITAMIN/MIN) TABS, Take 1 tablet by mouth daily., Disp: 30 tablet, Rfl:  .  Semaglutide (OZEMPIC, 0.25 OR 0.5 MG/DOSE, [place]), Inject 0.25 mg into the skin. Inject 0.25mg  weekly for four weeks then increase to 0.5mg  weekly, Disp: , Rfl:    Please see A&P for status of the patient's chronic medical conditions  Past Medical History:  Diagnosis Date  . Allergy    SEASONAL  . Anemia   . Arthritis   . Cataract    PRESENT IN  LEFT/ CATARACTS REMOVED FROM RIGHT  . Diabetes mellitus 1996  . DKA (diabetic ketoacidoses) ([place]) 01/02/2013  . Hyperlipidemia   . Hypertension   . Hypotension   . Influenza A (H1N1) 01/03/2013  . Neuromuscular disorder ([place])    NEUROPATHY  . SYNCOPE 05/10/2010   Qualifier: Diagnosis of  By: Marilynne Halsted RN, BSN, Jacquelyn     Review of Systems:  ***  Physical Exam:  There were no vitals filed for this visit. ***  Social History   Socioeconomic History  . Marital status: Married    Spouse name: Not on file  . Number of children: 1  . Years of education: Not on file  . Highest education level: Not on file  Occupational History  . Occupation: retired  Scientific laboratory technician  . Financial  resource strain: Not on file  . Food insecurity    Worry: Not on file    Inability: Not on file  . Transportation needs    Medical: Not on file    Non-medical: Not on file  Tobacco Use  . Smoking status: Never Smoker  . Smokeless tobacco: Never Used  Substance and Sexual Activity  . Alcohol use: No    Alcohol/week: 0.0 standard drinks  . Drug use: No  . Sexual activity: Not on file  Lifestyle  . Physical activity    Days per week: Not on file    Minutes per session: Not on file  . Stress: Not on file  Relationships  . Social Herbalist on phone: Not on file    Gets together: Not on file    Attends religious service: Not on file    Active member of club or organization: Not on file    Attends meetings of clubs or organizations: Not on file    Relationship status: Not on file  . Intimate partner violence    Fear of current or ex partner: Not on file    Emotionally abused: Not on file    Physically abused: Not on file    Forced sexual activity: Not on file  Other Topics Concern  . Not on file  Social History Narrative  . Not on file   *** Family History  Problem Relation Age of Onset  . Diabetes Mother   . Hypertension Mother   . Breast cancer Mother   . Aneurysm Father   . Diabetes Sister   . Hypertension Sister   . Kidney disease Sister        One Kidney transplant, one sister on HD  . Breast cancer Sister   . Colon cancer Neg Hx   . Esophageal cancer Neg Hx     Assessment & Plan:   See Encounters Tab for problem based charting.  Patient {GC/GE:[id number]::"discussed with","seen with"} Dr. {NAMES:[id number]::"Butcher","Granfortuna","E. Hoffman","Klima","Mullen","Narendra","Raines","Vincent"}

## 2019-07-29 ENCOUNTER — Encounter: Payer: Medicare Other | Admitting: Internal Medicine

## 2019-07-29 ENCOUNTER — Encounter: Payer: Self-pay | Admitting: Pharmacist

## 2019-07-29 ENCOUNTER — Ambulatory Visit: Payer: Medicare Other | Admitting: Pharmacist

## 2019-07-29 NOTE — Progress Notes (Deleted)
Medication Samples have been provided to the patient.  Drug: Trulicity Strength: 1.5 mg Qty: 2 LOT: [id number] E Exp.Date: 01/2021 Dosing instructions: inject 1 pen into skin once weekly  The patient has been instructed regarding the correct time, dose, and frequency of taking this medication, including desired effects and most common side effects. Patient advised to contact clinic if any questions arise and verbalized understanding by repeat back.  Amanda [name] 8:32 AM 07/29/2019  Patient is working with [facility] for access to [employer] (semaglutide) as a substitute for dulaglutide (unfortunately we did not have samples of Ozempic to give patient).

## 2019-07-29 NOTE — Progress Notes (Signed)
Medication Samples have been coordinated for the patient.  Drug: Trulicity Strength: 1.5 mg Qty: 2 LOT: [id number] E Exp.Date: 01/2021 Dosing instructions: inject 1 pen into skin once weekly  The patient has been instructed regarding the correct time, dose, and frequency of taking this medication, including desired effects and most common side effects. Patient advised to contact clinic if any questions arise and verbalized understanding by repeat back.  Amanda [name] 8:32 AM 07/29/2019  Patient is working with [facility] for access to [employer] (semaglutide) as a substitute for dulaglutide (unfortunately we did not have samples of Ozempic to give patient).

## 2019-08-05 ENCOUNTER — Ambulatory Visit: Payer: Medicare Other | Admitting: Internal Medicine

## 2019-08-05 MED FILL — ACCU-CHEK AVIVA PLUS TEST S: 25 days supply | Qty: 100 | Fill #3

## 2019-08-07 ENCOUNTER — Other Ambulatory Visit: Payer: Self-pay | Admitting: Pharmacy Technician

## 2019-08-07 NOTE — Patient Outreach (Signed)
[facility]) Care Management  08/07/2019  Amanda [name] [birth year] [id number]  Unsuccessful outreach call placed to patient in regards to [employer] application for [employer].  Today is the 6th overall call to the patient.  Unfortunately patient did not answer the phone.  Was calling patient to inform that [facility] has not received her application back that she informed she had placed in mail on 07/16/2019.  Mail deliveries have been an issue due to [place].  Will route note to [place] that patient assistance case is being closed due to not receiving the application back with option to reopen if application is received back. Will remove myself from care team.  Luiz Ochoa. Shamus Desantis, [place] Management [phone]

## 2019-08-11 IMAGING — MG DIGITAL SCREENING BILATERAL MAMMOGRAM WITH TOMO AND CAD
8 series · 9 of 24 positions shown · non-contrast
Comparison: Previous exam(s).

CLINICAL DATA: Screening.

EXAM:
DIGITAL SCREENING BILATERAL MAMMOGRAM WITH TOMO AND CAD

[R CC synth-2D]
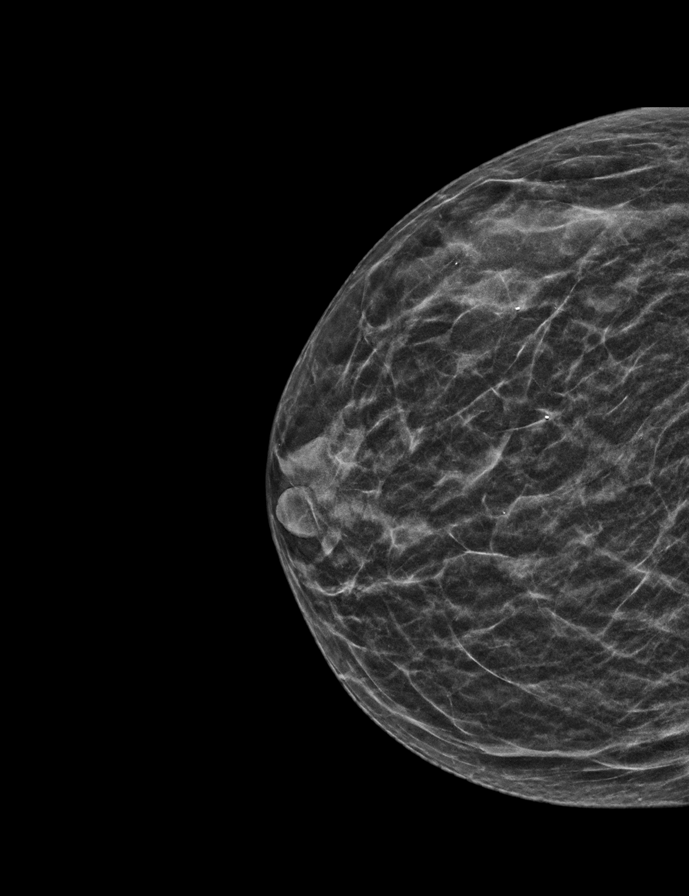

[L MLO synth-2D]
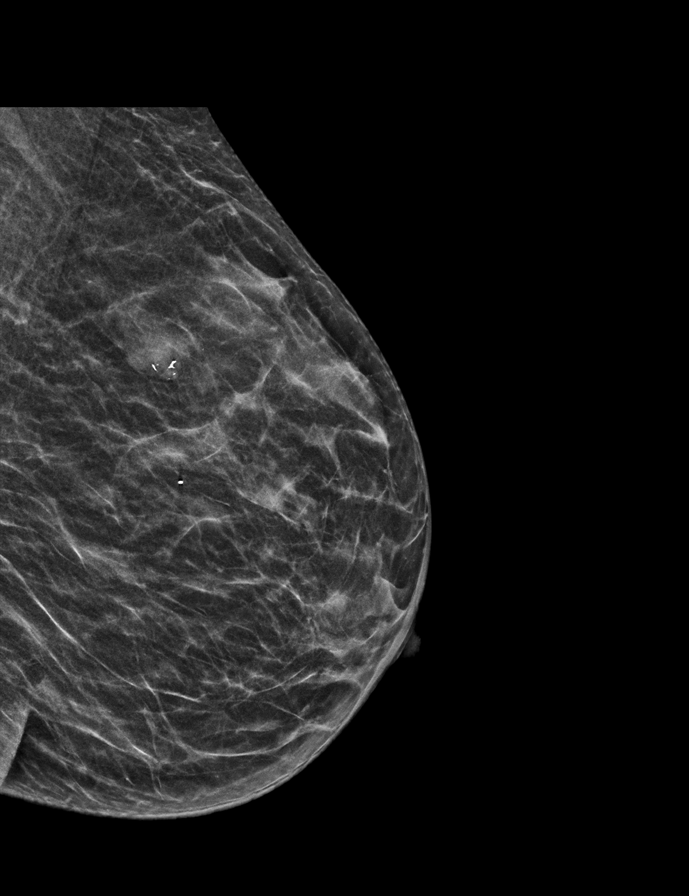

[R MLO synth-2D]
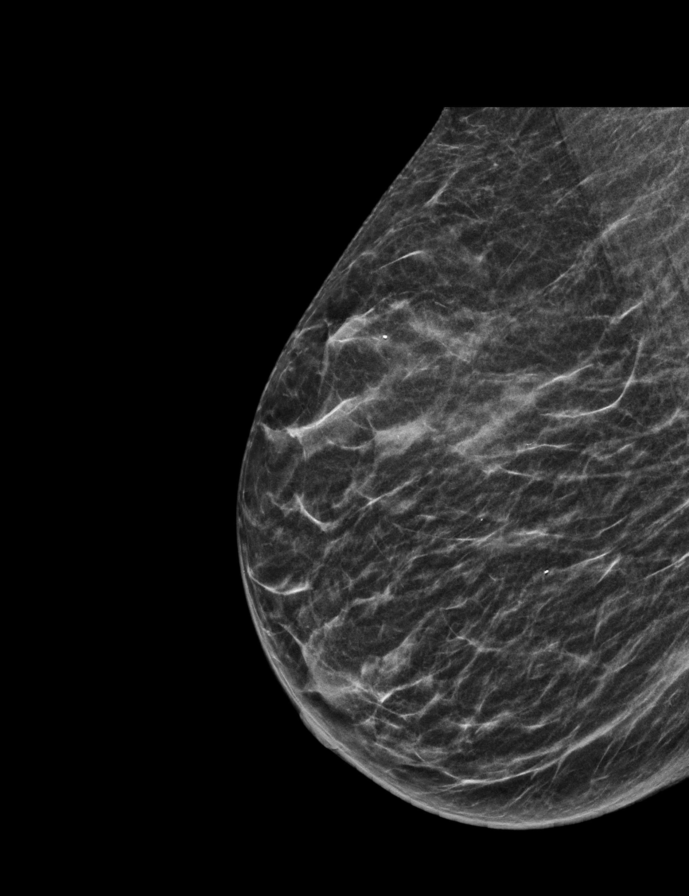

[L CC synth-2D]
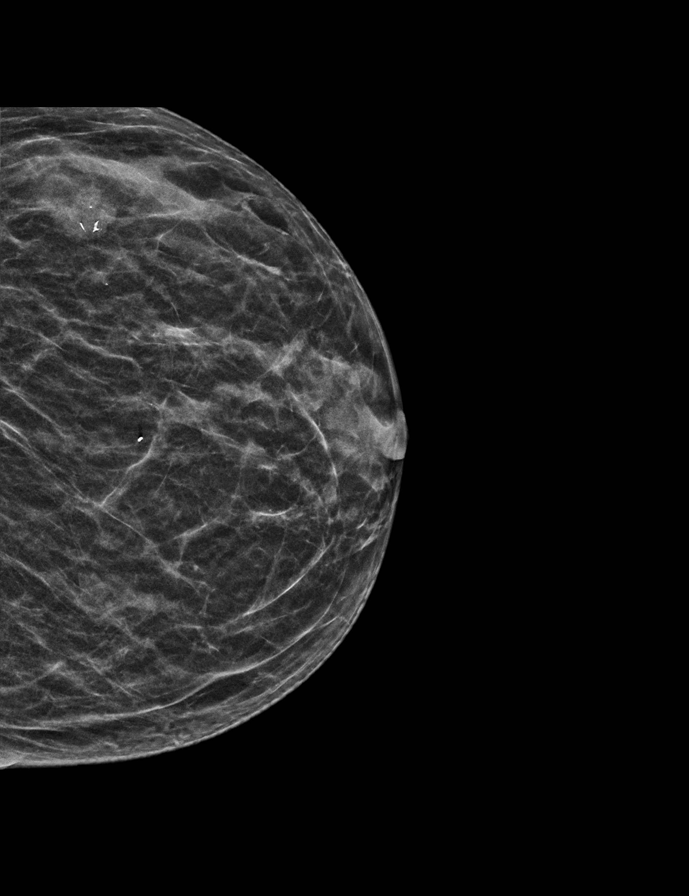

[R CC tomo · 2 of 37 frames shown]
[frame 13/37]
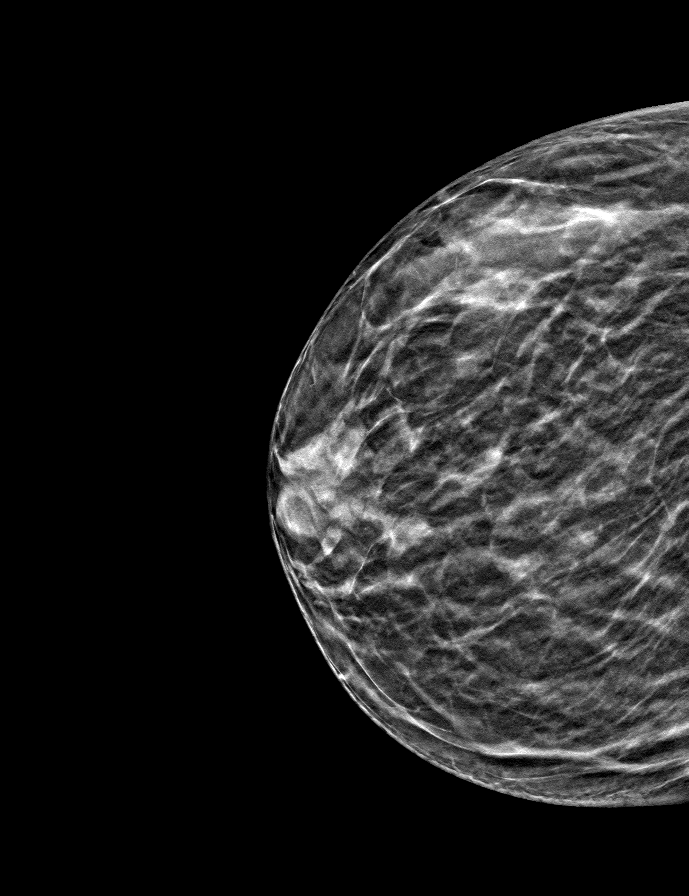
[frame 19/37]
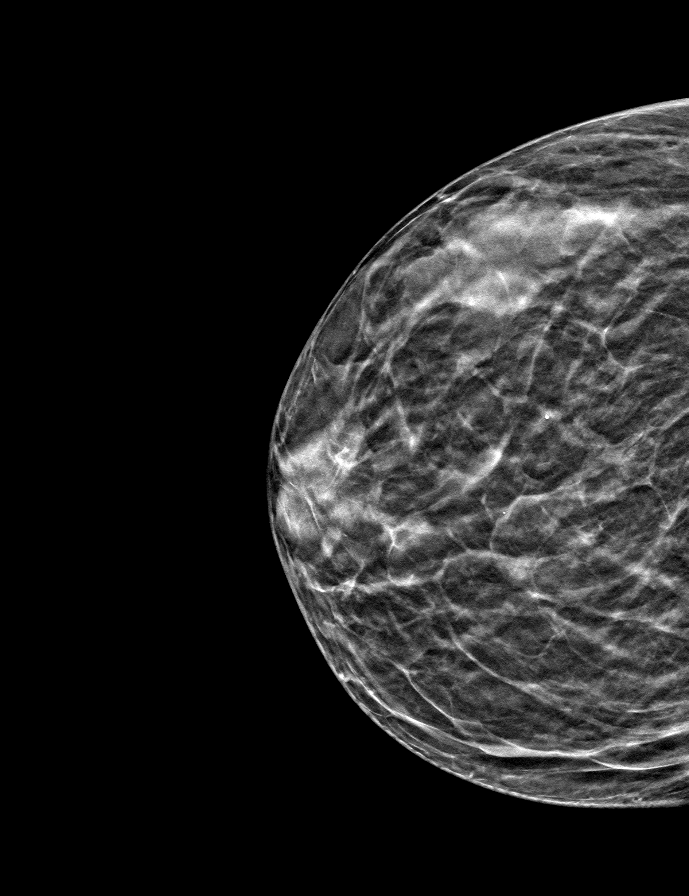

[L MLO tomo · tomo slice 20/39.0]
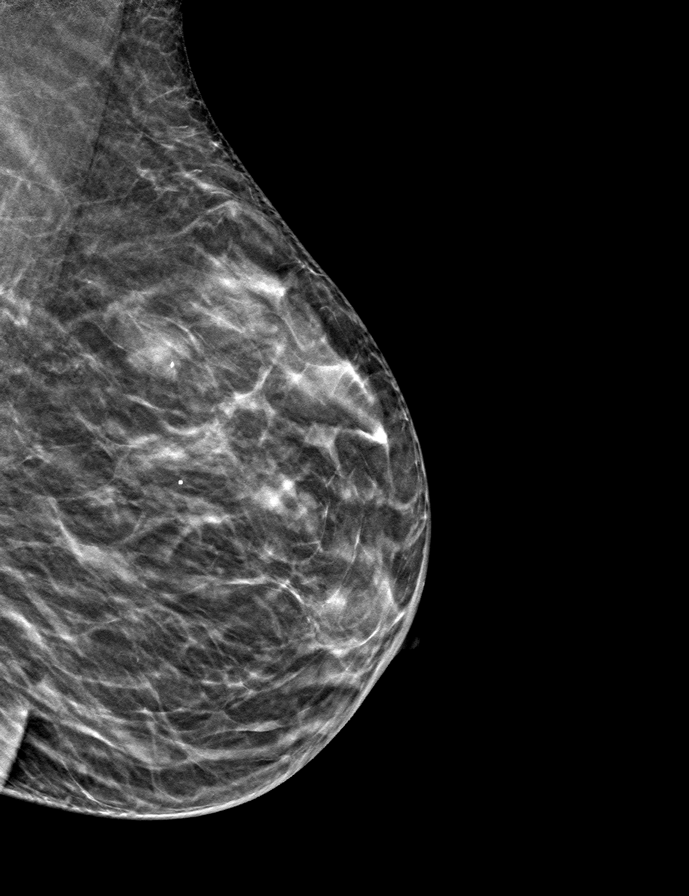

[L CC tomo · tomo slice 19/36.0]
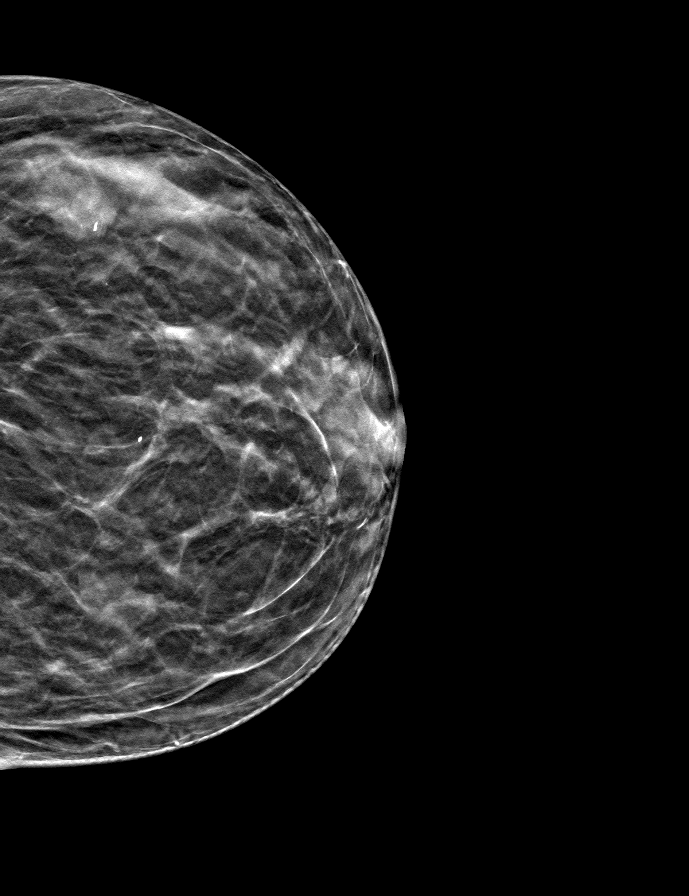

[R MLO tomo · tomo slice 19/37.0]
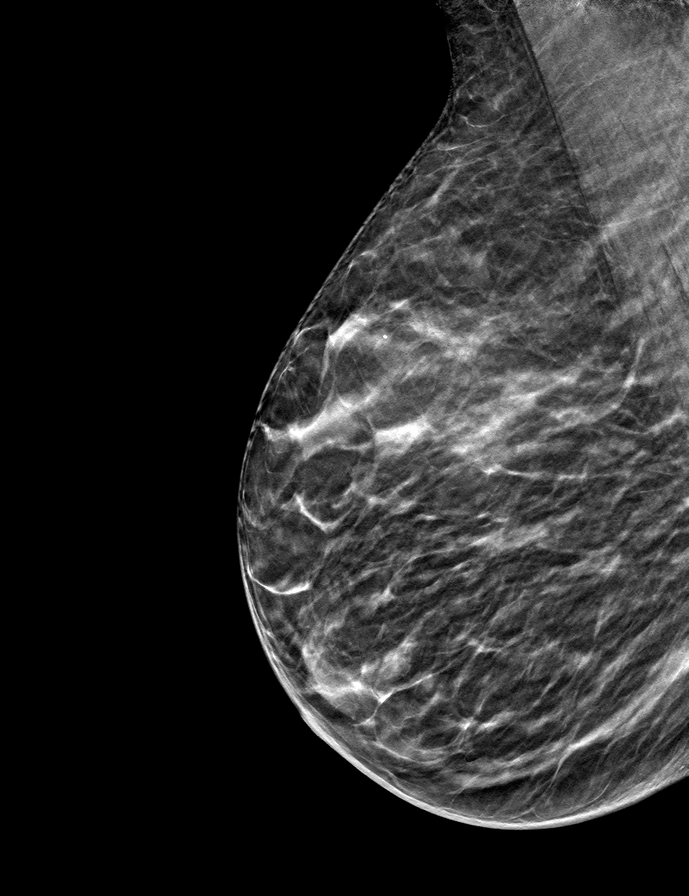

[9 of 24 positions shown; findings below may reference images not displayed]

ACR Breast Density Category b: There are scattered areas of
fibroglandular density.
FINDINGS: There are no findings suspicious for malignancy. Images were
processed with CAD.
IMPRESSION: No mammographic evidence of malignancy. A result letter of this
screening mammogram will be mailed directly to the patient.

RECOMMENDATION:
Screening mammogram in one year. (Code:[id number])

BI-RADS CATEGORY  1: Negative.

## 2019-08-19 ENCOUNTER — Other Ambulatory Visit: Payer: Self-pay | Admitting: Pharmacist

## 2019-08-19 NOTE — Patient Outreach (Signed)
[facility]) Care Management  08/19/2019  Amanda [name] October 04, 1951 [id number]  Patient was called to follow up on medication assistance. HIPAA identifiers were obtained. Patient said she was not sure about the application we did or a letter from [employer] about [employer].  She requested that another application be sent to her for Ozempic.  She reported being at a [place] appointment and could not talk long.  Plan: Route note to Sabinal Simcox to send patient another application for Ozempic.  [name], [name], [place] Clinical Pharmacist [phone]

## 2019-08-20 ENCOUNTER — Other Ambulatory Visit: Payer: Self-pay | Admitting: Pharmacy Technician

## 2019-08-20 NOTE — Patient Outreach (Signed)
[facility]) Care Management  08/20/2019  NYASHIA RANEY [birth year] [id number]                                        Medication Assistance Referral  Referral From: [place]  Medication/Company: Larna Daughters / [employer] Patient application portion:  Education officer, museum portion:  N/A already have provider's part back to Dr. Guadlupe Spanish Provider address/fax verified via: Office website  This will be the 3rd application that has been mailed to the patient for the same medication.   Follow up:  Will follow up with patient in 5-10 business days to confirm application(s) have been received.  Presly Steinruck P. Sharron Petruska, [place] Management [phone]

## 2019-08-21 ENCOUNTER — Ambulatory Visit: Payer: Medicare Other | Admitting: Podiatry

## 2019-08-28 ENCOUNTER — Other Ambulatory Visit: Payer: Self-pay | Admitting: Pharmacy Technician

## 2019-08-28 NOTE — Patient Outreach (Signed)
[facility]) Care Management  08/28/2019  Amanda [name] [birth year] [id number]   Successful outreach call placed to patient in regards to [employer] application for [place].  Spoke to patient, HIPAA identifiers verified.  Patient informed she did receive the application but has not mailed it back due to family circumstances. She informed she plans to do it ASAP and have it in the mail this week.  Will followup with patient in 10-15 business days if application has not been received.  Mohini Heathcock P. Crecencio Kwiatek, [place] Management [phone]

## 2019-09-02 ENCOUNTER — Encounter: Payer: Medicare Other | Admitting: Internal Medicine

## 2019-09-09 ENCOUNTER — Emergency Department (HOSPITAL_COMMUNITY): Payer: Medicare Other

## 2019-09-09 ENCOUNTER — Other Ambulatory Visit (HOSPITAL_COMMUNITY): Payer: Medicare Other

## 2019-09-09 ENCOUNTER — Encounter: Payer: Medicare Other | Admitting: Internal Medicine

## 2019-09-09 ENCOUNTER — Inpatient Hospital Stay (HOSPITAL_COMMUNITY)
Admission: EM | Admit: 2019-09-09 | Discharge: 2019-09-24 | DRG: 070 | Disposition: A | Discharge status: Skilled Nursing Facility | Payer: Medicare Other | Attending: Internal Medicine | Admitting: Internal Medicine

## 2019-09-09 ENCOUNTER — Encounter (HOSPITAL_COMMUNITY): Payer: Self-pay | Admitting: Emergency Medicine

## 2019-09-09 DIAGNOSIS — E1165 Type 2 diabetes mellitus with hyperglycemia: Secondary | ICD-10-CM | POA: Diagnosis present

## 2019-09-09 DIAGNOSIS — M1990 Unspecified osteoarthritis, unspecified site: Secondary | ICD-10-CM | POA: Diagnosis present

## 2019-09-09 DIAGNOSIS — M8580 Other specified disorders of bone density and structure, unspecified site: Secondary | ICD-10-CM | POA: Diagnosis present

## 2019-09-09 DIAGNOSIS — E118 Type 2 diabetes mellitus with unspecified complications: Secondary | ICD-10-CM

## 2019-09-09 DIAGNOSIS — J690 Pneumonitis due to inhalation of food and vomit: Secondary | ICD-10-CM | POA: Diagnosis not present

## 2019-09-09 DIAGNOSIS — E785 Hyperlipidemia, unspecified: Secondary | ICD-10-CM | POA: Diagnosis present

## 2019-09-09 DIAGNOSIS — Z7982 Long term (current) use of aspirin: Secondary | ICD-10-CM

## 2019-09-09 DIAGNOSIS — Z79899 Other long term (current) drug therapy: Secondary | ICD-10-CM

## 2019-09-09 DIAGNOSIS — Z20828 Contact with and (suspected) exposure to other viral communicable diseases: Secondary | ICD-10-CM | POA: Diagnosis present

## 2019-09-09 DIAGNOSIS — E1140 Type 2 diabetes mellitus with diabetic neuropathy, unspecified: Secondary | ICD-10-CM | POA: Diagnosis present

## 2019-09-09 DIAGNOSIS — I6783 Posterior reversible encephalopathy syndrome: Secondary | ICD-10-CM | POA: Diagnosis present

## 2019-09-09 DIAGNOSIS — I161 Hypertensive emergency: Secondary | ICD-10-CM | POA: Diagnosis present

## 2019-09-09 DIAGNOSIS — Z4659 Encounter for fitting and adjustment of other gastrointestinal appliance and device: Secondary | ICD-10-CM

## 2019-09-09 DIAGNOSIS — R1310 Dysphagia, unspecified: Secondary | ICD-10-CM | POA: Diagnosis not present

## 2019-09-09 DIAGNOSIS — K529 Noninfective gastroenteritis and colitis, unspecified: Secondary | ICD-10-CM | POA: Diagnosis present

## 2019-09-09 DIAGNOSIS — R0602 Shortness of breath: Secondary | ICD-10-CM

## 2019-09-09 DIAGNOSIS — J9690 Respiratory failure, unspecified, unspecified whether with hypoxia or hypercapnia: Secondary | ICD-10-CM

## 2019-09-09 DIAGNOSIS — G9341 Metabolic encephalopathy: Secondary | ICD-10-CM | POA: Diagnosis present

## 2019-09-09 DIAGNOSIS — S062X4A Diffuse traumatic brain injury with loss of consciousness of 6 hours to 24 hours, initial encounter: Secondary | ICD-10-CM | POA: Diagnosis not present

## 2019-09-09 DIAGNOSIS — N179 Acute kidney failure, unspecified: Secondary | ICD-10-CM | POA: Diagnosis present

## 2019-09-09 DIAGNOSIS — Z803 Family history of malignant neoplasm of breast: Secondary | ICD-10-CM

## 2019-09-09 DIAGNOSIS — Z8249 Family history of ischemic heart disease and other diseases of the circulatory system: Secondary | ICD-10-CM

## 2019-09-09 DIAGNOSIS — E876 Hypokalemia: Secondary | ICD-10-CM | POA: Diagnosis not present

## 2019-09-09 DIAGNOSIS — J9600 Acute respiratory failure, unspecified whether with hypoxia or hypercapnia: Secondary | ICD-10-CM | POA: Diagnosis not present

## 2019-09-09 DIAGNOSIS — Z23 Encounter for immunization: Secondary | ICD-10-CM | POA: Diagnosis present

## 2019-09-09 DIAGNOSIS — G9340 Encephalopathy, unspecified: Secondary | ICD-10-CM | POA: Diagnosis present

## 2019-09-09 DIAGNOSIS — R4182 Altered mental status, unspecified: Secondary | ICD-10-CM | POA: Diagnosis present

## 2019-09-09 DIAGNOSIS — Z888 Allergy status to other drugs, medicaments and biological substances status: Secondary | ICD-10-CM

## 2019-09-09 DIAGNOSIS — G931 Anoxic brain damage, not elsewhere classified: Secondary | ICD-10-CM | POA: Diagnosis present

## 2019-09-09 DIAGNOSIS — E1111 Type 2 diabetes mellitus with ketoacidosis with coma: Secondary | ICD-10-CM | POA: Diagnosis not present

## 2019-09-09 DIAGNOSIS — N170 Acute kidney failure with tubular necrosis: Secondary | ICD-10-CM | POA: Diagnosis not present

## 2019-09-09 DIAGNOSIS — I5032 Chronic diastolic (congestive) heart failure: Secondary | ICD-10-CM | POA: Diagnosis present

## 2019-09-09 DIAGNOSIS — M6282 Rhabdomyolysis: Secondary | ICD-10-CM | POA: Diagnosis present

## 2019-09-09 DIAGNOSIS — Z7984 Long term (current) use of oral hypoglycemic drugs: Secondary | ICD-10-CM

## 2019-09-09 DIAGNOSIS — N183 Chronic kidney disease, stage 3 (moderate): Secondary | ICD-10-CM | POA: Diagnosis not present

## 2019-09-09 DIAGNOSIS — J9601 Acute respiratory failure with hypoxia: Secondary | ICD-10-CM | POA: Diagnosis present

## 2019-09-09 DIAGNOSIS — N184 Chronic kidney disease, stage 4 (severe): Secondary | ICD-10-CM | POA: Diagnosis present

## 2019-09-09 DIAGNOSIS — E1100 Type 2 diabetes mellitus with hyperosmolarity without nonketotic hyperglycemic-hyperosmolar coma (NKHHC): Secondary | ICD-10-CM | POA: Diagnosis present

## 2019-09-09 DIAGNOSIS — E870 Hyperosmolality and hypernatremia: Secondary | ICD-10-CM | POA: Diagnosis not present

## 2019-09-09 DIAGNOSIS — N182 Chronic kidney disease, stage 2 (mild): Secondary | ICD-10-CM | POA: Diagnosis not present

## 2019-09-09 DIAGNOSIS — Z841 Family history of disorders of kidney and ureter: Secondary | ICD-10-CM

## 2019-09-09 DIAGNOSIS — Z794 Long term (current) use of insulin: Secondary | ICD-10-CM

## 2019-09-09 DIAGNOSIS — I130 Hypertensive heart and chronic kidney disease with heart failure and stage 1 through stage 4 chronic kidney disease, or unspecified chronic kidney disease: Secondary | ICD-10-CM | POA: Diagnosis present

## 2019-09-09 DIAGNOSIS — E872 Acidosis: Secondary | ICD-10-CM | POA: Diagnosis present

## 2019-09-09 DIAGNOSIS — Z833 Family history of diabetes mellitus: Secondary | ICD-10-CM

## 2019-09-09 DIAGNOSIS — R1110 Vomiting, unspecified: Secondary | ICD-10-CM

## 2019-09-09 DIAGNOSIS — N189 Chronic kidney disease, unspecified: Secondary | ICD-10-CM

## 2019-09-09 DIAGNOSIS — D631 Anemia in chronic kidney disease: Secondary | ICD-10-CM | POA: Diagnosis present

## 2019-09-09 LAB — POCT I-STAT EG7
Acid-base deficit: 4 mmol/L — ABNORMAL HIGH (ref 0.0–2.0)
Acid-base deficit: 6 mmol/L — ABNORMAL HIGH (ref 0.0–2.0)
Bicarbonate: 22 mmol/L (ref 20.0–28.0)
Bicarbonate: 17.4 mmol/L — ABNORMAL LOW (ref 20.0–28.0)
Calcium, Ion: 1.2 mmol/L (ref 1.15–1.40)
Calcium, Ion: 1.1 mmol/L — ABNORMAL LOW (ref 1.15–1.40)
HCT: 37 % (ref 36.0–46.0)
HCT: 33 % — ABNORMAL LOW (ref 36.0–46.0)
Hemoglobin: 12.6 g/dL (ref 12.0–15.0)
Hemoglobin: 11.2 g/dL — ABNORMAL LOW (ref 12.0–15.0)
O2 Saturation: 91 %
O2 Saturation: 99 %
Potassium: 4.5 mmol/L (ref 3.5–5.1)
Potassium: 4.7 mmol/L (ref 3.5–5.1)
Sodium: 139 mmol/L (ref 135–145)
Sodium: 138 mmol/L (ref 135–145)
TCO2: 23 mmol/L (ref 22–32)
TCO2: 18 mmol/L — ABNORMAL LOW (ref 22–32)
pCO2, Ven: 40.2 mmHg — ABNORMAL LOW (ref 44.0–60.0)
pCO2, Ven: 26.1 mmHg — ABNORMAL LOW (ref 44.0–60.0)
pH, Ven: 7.345 (ref 7.250–7.430)
pH, Ven: 7.431 — ABNORMAL HIGH (ref 7.250–7.430)
pO2, Ven: 66 mmHg — ABNORMAL HIGH (ref 32.0–45.0)
pO2, Ven: 141 mmHg — ABNORMAL HIGH (ref 32.0–45.0)

## 2019-09-09 LAB — CBC WITH DIFFERENTIAL/PLATELET
Abs Immature Granulocytes: 0.05 10*3/uL (ref 0.00–0.07)
Basophils Absolute: 0 10*3/uL (ref 0.0–0.1)
Basophils Relative: 0 %
Eosinophils Absolute: 0 10*3/uL (ref 0.0–0.5)
Eosinophils Relative: 0 %
HCT: 36.4 % (ref 36.0–46.0)
Hemoglobin: 12.2 g/dL (ref 12.0–15.0)
Immature Granulocytes: 0 %
Lymphocytes Relative: 4 %
Lymphs Abs: 0.5 10*3/uL — ABNORMAL LOW (ref 0.7–4.0)
MCH: 30.3 pg (ref 26.0–34.0)
MCHC: 33.5 g/dL (ref 30.0–36.0)
MCV: 90.5 fL (ref 80.0–100.0)
Monocytes Absolute: 0.8 10*3/uL (ref 0.1–1.0)
Monocytes Relative: 5 %
Neutro Abs: 13.1 10*3/uL — ABNORMAL HIGH (ref 1.7–7.7)
Neutrophils Relative %: 91 %
Platelets: 360 10*3/uL (ref 150–400)
RBC: 4.02 MIL/uL (ref 3.87–5.11)
RDW: 11.6 % (ref 11.5–15.5)
WBC: 14.4 10*3/uL — ABNORMAL HIGH (ref 4.0–10.5)
nRBC: 0 % (ref 0.0–0.2)

## 2019-09-09 LAB — POCT I-STAT 7, (LYTES, BLD GAS, ICA,H+H)
Acid-base deficit: 3 mmol/L — ABNORMAL HIGH (ref 0.0–2.0)
Bicarbonate: 20.6 mmol/L (ref 20.0–28.0)
Calcium, Ion: 1.22 mmol/L (ref 1.15–1.40)
HCT: 31 % — ABNORMAL LOW (ref 36.0–46.0)
Hemoglobin: 10.5 g/dL — ABNORMAL LOW (ref 12.0–15.0)
O2 Saturation: 100 %
Patient temperature: 105.1
Potassium: 4.4 mmol/L (ref 3.5–5.1)
Sodium: 143 mmol/L (ref 135–145)
TCO2: 22 mmol/L (ref 22–32)
pCO2 arterial: 35.6 mmHg (ref 32.0–48.0)
pH, Arterial: 7.386 (ref 7.350–7.450)
pO2, Arterial: 370 mmHg — ABNORMAL HIGH (ref 83.0–108.0)

## 2019-09-09 LAB — COMPREHENSIVE METABOLIC PANEL
ALT: 51 U/L — ABNORMAL HIGH (ref 0–44)
AST: 45 U/L — ABNORMAL HIGH (ref 15–41)
Albumin: 4.5 g/dL (ref 3.5–5.0)
Alkaline Phosphatase: 79 U/L (ref 38–126)
Anion gap: 17 — ABNORMAL HIGH (ref 5–15)
BUN: 36 mg/dL — ABNORMAL HIGH (ref 8–23)
CO2: 21 mmol/L — ABNORMAL LOW (ref 22–32)
Calcium: 10 mg/dL (ref 8.9–10.3)
Chloride: 101 mmol/L (ref 98–111)
Creatinine, Ser: 1.87 mg/dL — ABNORMAL HIGH (ref 0.44–1.00)
GFR calc Af Amer: 32 mL/min — ABNORMAL LOW (ref >60)
GFR calc non Af Amer: 27 mL/min — ABNORMAL LOW (ref >60)
Glucose, Bld: 522 mg/dL (ref 70–99)
Potassium: 4.6 mmol/L (ref 3.5–5.1)
Sodium: 139 mmol/L (ref 135–145)
Total Bilirubin: 1.2 mg/dL (ref 0.3–1.2)
Total Protein: 7.8 g/dL (ref 6.5–8.1)

## 2019-09-09 LAB — BASIC METABOLIC PANEL
Anion gap: 16 — ABNORMAL HIGH (ref 5–15)
Anion gap: 16 — ABNORMAL HIGH (ref 5–15)
BUN: 36 mg/dL — ABNORMAL HIGH (ref 8–23)
BUN: 33 mg/dL — ABNORMAL HIGH (ref 8–23)
CO2: 19 mmol/L — ABNORMAL LOW (ref 22–32)
CO2: 18 mmol/L — ABNORMAL LOW (ref 22–32)
Calcium: 9.5 mg/dL (ref 8.9–10.3)
Calcium: 9.1 mg/dL (ref 8.9–10.3)
Chloride: 103 mmol/L (ref 98–111)
Chloride: 105 mmol/L (ref 98–111)
Creatinine, Ser: 1.74 mg/dL — ABNORMAL HIGH (ref 0.44–1.00)
Creatinine, Ser: 1.82 mg/dL — ABNORMAL HIGH (ref 0.44–1.00)
GFR calc Af Amer: 35 mL/min — ABNORMAL LOW (ref >60)
GFR calc Af Amer: 33 mL/min — ABNORMAL LOW (ref >60)
GFR calc non Af Amer: 30 mL/min — ABNORMAL LOW (ref >60)
GFR calc non Af Amer: 28 mL/min — ABNORMAL LOW (ref >60)
Glucose, Bld: 532 mg/dL (ref 70–99)
Glucose, Bld: 491 mg/dL — ABNORMAL HIGH (ref 70–99)
Potassium: 4.7 mmol/L (ref 3.5–5.1)
Potassium: 5.1 mmol/L (ref 3.5–5.1)
Sodium: 138 mmol/L (ref 135–145)
Sodium: 139 mmol/L (ref 135–145)

## 2019-09-09 LAB — URINALYSIS, ROUTINE W REFLEX MICROSCOPIC
Bacteria, UA: NONE SEEN
Bilirubin Urine: NEGATIVE
Glucose, UA: >=500 mg/dL — AB
Ketones, ur: 5 mg/dL — AB
Leukocytes,Ua: NEGATIVE
Nitrite: NEGATIVE
Protein, ur: >=300 mg/dL — AB
Specific Gravity, Urine: 1.023 (ref 1.005–1.030)
pH: 5 (ref 5.0–8.0)

## 2019-09-09 LAB — CBG MONITORING, ED
Glucose-Capillary: 313 mg/dL — ABNORMAL HIGH (ref 70–99)
Glucose-Capillary: 332 mg/dL — ABNORMAL HIGH (ref 70–99)
Glucose-Capillary: 386 mg/dL — ABNORMAL HIGH (ref 70–99)
Glucose-Capillary: 483 mg/dL — ABNORMAL HIGH (ref 70–99)
Glucose-Capillary: 483 mg/dL — ABNORMAL HIGH (ref 70–99)

## 2019-09-09 LAB — APTT: aPTT: 26 seconds (ref 24–36)

## 2019-09-09 LAB — LACTIC ACID, PLASMA
Lactic Acid, Venous: 2.6 mmol/L (ref 0.5–1.9)
Lactic Acid, Venous: 3 mmol/L (ref 0.5–1.9)

## 2019-09-09 LAB — PROTIME-INR
INR: 1.1 (ref 0.8–1.2)
Prothrombin Time: 14.5 seconds (ref 11.4–15.2)

## 2019-09-09 LAB — HEMOGLOBIN A1C
Hgb A1c MFr Bld: 10.9 % — ABNORMAL HIGH (ref 4.8–5.6)
Mean Plasma Glucose: 266.13 mg/dL

## 2019-09-09 LAB — CK: Total CK: 125 U/L (ref 38–234)

## 2019-09-09 LAB — SARS CORONAVIRUS 2 BY RT PCR (HOSPITAL ORDER, PERFORMED IN ~~LOC~~ HOSPITAL LAB): SARS Coronavirus 2: NEGATIVE

## 2019-09-09 LAB — TSH: TSH: 0.552 u[IU]/mL (ref 0.350–4.500)

## 2019-09-09 MED ORDER — METRONIDAZOLE IN NACL 5-0.79 MG/ML-% IV SOLN
500.0000 mg | Freq: Once | INTRAVENOUS | Status: AC
  Administered 2019-09-09: 500 mg via INTRAVENOUS
  Filled 2019-09-09: qty 100

## 2019-09-09 MED ORDER — DEXTROSE-NACL 5-0.45 % IV SOLN
INTRAVENOUS | Status: DC
  Administered 2019-09-10: 125 mL/h via INTRAVENOUS

## 2019-09-09 MED ORDER — ORAL CARE MOUTH RINSE
15.0000 mL | OROMUCOSAL | Status: DC
  Administered 2019-09-10 – 2019-09-13 (×34): 15 mL via OROMUCOSAL

## 2019-09-09 MED ORDER — LACTATED RINGERS IV BOLUS (SEPSIS)
1000.0000 mL | Freq: Once | INTRAVENOUS | Status: AC
  Administered 2019-09-09: 1000 mL via INTRAVENOUS

## 2019-09-09 MED ORDER — VANCOMYCIN VARIABLE DOSE PER UNSTABLE RENAL FUNCTION (PHARMACIST DOSING): Status: DC

## 2019-09-09 MED ORDER — PROPOFOL 1000 MG/100ML IV EMUL
INTRAVENOUS | Status: DC | PRN
  Administered 2019-09-09: 10 ug/kg/min via INTRAVENOUS

## 2019-09-09 MED ORDER — PANTOPRAZOLE SODIUM 40 MG IV SOLR
40.0000 mg | Freq: Every day | INTRAVENOUS | Status: DC
  Administered 2019-09-10 – 2019-09-11 (×2): 40 mg via INTRAVENOUS
  Filled 2019-09-09 (×2): qty 40

## 2019-09-09 MED ORDER — ACETAMINOPHEN 650 MG RE SUPP
650.0000 mg | Freq: Once | RECTAL | Status: AC
  Administered 2019-09-09: 650 mg via RECTAL
  Filled 2019-09-09: qty 1

## 2019-09-09 MED ORDER — SODIUM CHLORIDE 0.9 % IV SOLN
INTRAVENOUS | Status: DC
  Administered 2019-09-09: 21:00:00 via INTRAVENOUS

## 2019-09-09 MED ORDER — SODIUM CHLORIDE 0.9 % IV SOLN: 2.0000 g | INTRAVENOUS | Status: DC

## 2019-09-09 MED ORDER — FENTANYL CITRATE (PF) 100 MCG/2ML IJ SOLN
25.0000 ug | INTRAMUSCULAR | Status: DC | PRN
  Administered 2019-09-10 – 2019-09-11 (×3): 100 ug via INTRAVENOUS
  Filled 2019-09-09 (×4): qty 2

## 2019-09-09 MED ORDER — POTASSIUM CHLORIDE 10 MEQ/100ML IV SOLN
10.0000 meq | INTRAVENOUS | Status: AC
  Administered 2019-09-09 (×2): 10 meq via INTRAVENOUS
  Filled 2019-09-09 (×2): qty 100

## 2019-09-09 MED ORDER — SODIUM CHLORIDE 0.9 % IV SOLN
2.0000 g | Freq: Once | INTRAVENOUS | Status: AC
  Administered 2019-09-09: 2 g via INTRAVENOUS
  Filled 2019-09-09: qty 20

## 2019-09-09 MED ORDER — ETOMIDATE 2 MG/ML IV SOLN
INTRAVENOUS | Status: AC | PRN
  Administered 2019-09-09: 18 mg via INTRAVENOUS

## 2019-09-09 MED ORDER — CHLORHEXIDINE GLUCONATE 0.12% ORAL RINSE (MEDLINE KIT)
15.0000 mL | Freq: Two times a day (BID) | OROMUCOSAL | Status: DC
  Administered 2019-09-10 – 2019-09-24 (×28): 15 mL via OROMUCOSAL

## 2019-09-09 MED ORDER — VANCOMYCIN HCL IN DEXTROSE 1-5 GM/200ML-% IV SOLN
1000.0000 mg | Freq: Once | INTRAVENOUS | Status: AC
  Administered 2019-09-09: 1000 mg via INTRAVENOUS
  Filled 2019-09-09: qty 200

## 2019-09-09 MED ORDER — LACTATED RINGERS IV BOLUS (SEPSIS)
500.0000 mL | Freq: Once | INTRAVENOUS | Status: AC
  Administered 2019-09-09: 500 mL via INTRAVENOUS

## 2019-09-09 MED ORDER — LACTATED RINGERS IV BOLUS (SEPSIS)
250.0000 mL | Freq: Once | INTRAVENOUS | Status: AC
  Administered 2019-09-09: 250 mL via INTRAVENOUS

## 2019-09-09 MED ORDER — PROPOFOL 1000 MG/100ML IV EMUL
INTRAVENOUS | Status: AC
  Filled 2019-09-09: qty 100

## 2019-09-09 MED ORDER — INSULIN REGULAR(HUMAN) IN NACL 100-0.9 UT/100ML-% IV SOLN
INTRAVENOUS | Status: AC
  Administered 2019-09-09: 4.2 [IU]/h via INTRAVENOUS
  Filled 2019-09-09: qty 100

## 2019-09-09 MED ORDER — HEPARIN SODIUM (PORCINE) 5000 UNIT/ML IJ SOLN
5000.0000 [IU] | Freq: Three times a day (TID) | INTRAMUSCULAR | Status: DC
  Administered 2019-09-10 – 2019-09-24 (×44): 5000 [IU] via SUBCUTANEOUS
  Filled 2019-09-09 (×44): qty 1

## 2019-09-09 MED ORDER — SUCCINYLCHOLINE CHLORIDE 20 MG/ML IJ SOLN
INTRAMUSCULAR | Status: AC | PRN
  Administered 2019-09-09: 130 mg via INTRAVENOUS

## 2019-09-09 MED ORDER — FENTANYL CITRATE (PF) 100 MCG/2ML IJ SOLN: 25.0000 ug | INTRAMUSCULAR | Status: DC | PRN

## 2019-09-09 MED ORDER — IOHEXOL 350 MG/ML SOLN
75.0000 mL | Freq: Once | INTRAVENOUS | Status: AC | PRN
  Administered 2019-09-09: 75 mL via INTRAVENOUS

## 2019-09-09 MED ORDER — LACTATED RINGERS IV BOLUS
1000.0000 mL | Freq: Once | INTRAVENOUS | Status: AC
  Administered 2019-09-09: 1000 mL via INTRAVENOUS

## 2019-09-09 MED ORDER — PROPOFOL 1000 MG/100ML IV EMUL
0.0000 ug/kg/min | INTRAVENOUS | Status: DC
  Administered 2019-09-09: 10 ug/kg/min via INTRAVENOUS
  Administered 2019-09-10: 5 ug/kg/min via INTRAVENOUS
  Administered 2019-09-10: 10 ug/kg/min via INTRAVENOUS
  Administered 2019-09-11: 25 ug/kg/min via INTRAVENOUS
  Filled 2019-09-09 (×3): qty 100

## 2019-09-09 MED ORDER — SODIUM CHLORIDE 0.9 % IV SOLN
2.0000 g | Freq: Once | INTRAVENOUS | Status: AC
  Administered 2019-09-09: 2 g via INTRAVENOUS
  Filled 2019-09-09: qty 2

## 2019-09-09 NOTE — ED Notes (Signed)
CBG Results of 483 reported to [place], Therapist, sports.

## 2019-09-09 NOTE — Progress Notes (Signed)
EEG complete - results pending 

## 2019-09-09 NOTE — ED Notes (Signed)
Called CT to verify an estimated time for pt scan.

## 2019-09-09 NOTE — ED Triage Notes (Signed)
Per EMS: pt from home, found by (disabled) husband on the floor at the edge of bed. Unknown down time and unknown LSN. EMS notes the pt's car door and front door to house was open. Unknown baseline mental status. Pt not following commands.   EMS Vitals: CBG - High  HR 120 Spo2 98% RA

## 2019-09-09 NOTE — ED Notes (Signed)
ED Provider at bedside. 

## 2019-09-09 NOTE — H&P (Signed)
NAME:  Amanda [name], MRN:  [id number], DOB:  Dec 14, 1951, LOS: 0 ADMISSION DATE:  09/09/2019, CONSULTATION DATE:  09/09/2019 REFERRING MD:  Dr. Rex Kras, CHIEF COMPLAINT:  Acute encephalopathy  Brief History   68 year old female presenting from home with acute encephalopathy.  No clear LSW.  Remained hypertensive, hyperglycemic, febrile, and ongoing unresponsiveness requiring intubation for airway protection.  CT/ CTA neg.   Neurology consulted, PCCM to admit.  History of present illness   HPI obtained from medical chart review as patient is sedated on mechanical ventilation and no family able to be reached.   68 year old female with history of diabetes, HTN, and neuropathy presenting from home with altered mental status.  EMS had reported that patient was found alert with eyes open, not following commands, and left arm contracted in at the base of her disabled husband bed covered in feces.  Husband not ale to provide any insight on scene or last seen well. Neighbors had reported that her car door had been opened since last night and front door found open.  Found to glucose reading high.  In ER, patient febrile 101.9 rectal, hypertensive 207/104, and tachycardic.  CT head and CTA head and neck without acute findings.  Labs noted for  Na 139, CO2 21, glucose 522, BUN 36, sCr 1.87, CK 125, LA 2.6->3, WBC 14.4, normal coags, UA with high glucose, 5 ketones, >300 protein.  She required intubation for airway protection after some desaturation episodes in the 80's.  Neurology consulted.  Cultures sent and started on empiric vancomycin, cefepime, and flagyl and ceftriaxone add for meningeal coverage.  30 cc/kg fluid bolus given.  EEG showing negative for seizure activity, showing encephalopathy.  LP pending.  PCCM to admit.   Past Medical History  Never smoker, diabetes, neuropathy, HTN, [place] [facility] Events   09/09/2019  Consults:  Neurology  Procedures:  9/21 ETT >>  9/21 LP    Significant Diagnostic Tests:  9/21 [facility] >>neg 9/21 CTchest/  abd >> 1. Respiratory motion artifact versus less likely developing infiltrate in the left lower lobe. Clinical correlation is recommended. 2. Artifact versus mild colitis. Clinical correlation is recommended. No bowel obstruction. 3. Cholelithiasis. Ultrasound is recommended for better evaluation of the gallbladder. Aortic Atherosclerosis   9/21 CTA head/ neck >> 1. 60% diameter stenosis proximal right internal carotid artery due to heavily calcified plaque. Moderate stenosis right cavernous carotid. 2. Mild atherosclerotic calcification left carotid bulb without stenosis. Moderate stenosis left cavernous carotid due to calcific stenosis. 3. Mild stenosis of the vertebral artery bilaterally at the skull base otherwise clear 4. No intracranial large vessel occlusion. Mild stenosis right M1 segment. 5. 6 x 10 mm ossification ligamentum flavum on the right at C6-7 may represent meningioma.  9/21 EGG >>generalized nonspecific cerebral dysfunction (encephalopathy). There was no seizure or seizure predisposition recorded on this study. 9/21 LP  Micro Data:  9/21 SARS CoV2 >> 9/21 BCx 2 >> 9/21 CSF >>  Antimicrobials:  9/21 flagyl 9/21 vanc >> 9/21 cefepime >> 9/21 ceftriaxone >>  Interim history/subjective:  LP being performed by EDP.  Patient on propofol 40 mcg/kg/min for procedure, due to restlessness and ongoing posturing   Objective   Blood pressure (!) 187/82, pulse (!) 135, temperature (!) 104.3 F (40.2 C), resp. rate 19, height 5\' 4"  (1.626 m), weight 61.3 kg, SpO2 100 %.    Vent Mode: PRVC FiO2 (%):  [100 %] 100 % Set Rate:  [18 bmp] 18 bmp  Vt Set:  [440 mL] 440 mL PEEP:  [5 cmH20] 5 cmH20   Intake/Output Summary (Last 24 hours) at 09/09/2019 2303 Last data filed at 09/09/2019 2211 Gross per 24 hour  Intake 1950 ml  Output -  Net 1950 ml   Filed Weights   09/09/19 1700  Weight: 61.3 kg   Examination:  General:  Ill appearing female in NAD HEENT: MM pink/moist, right pupil irregular 3/ nonreactive, left pupil 3/non reactive, absent corneal reflexes, ETT/ OGT, no obvious nucal rigidity  Neuro: decorticate posturing spontaneously, does not open eyes, or follow commands CV: rr, ST 130's, no murmur PULM:  Diffuse rhonchi, clear thin white secretions, delayed cough reflex, is breathing over set MV rate GI: soft, NT/ ND, no masses, +bs, foley- CYU Extremities: warm/dry, no LE edema  Skin: no rashes  Resolved Hospital Problem list     Assessment & Plan:   Acute encephalopathy - etiology unclear at this time, toxic vs metabolic  - presentation does not fit a specific toxidrome - CT/ CTA without acute findings, EEG c/w nonspecific encephalopathy, absent seizure activity - ddx broad: toxic vs metabolic, vs PRES, vs CNS process, vs viral/ infectious process P:  Appreciate Neurology assistance LP results pending  MRI brain stat ordered Trending neuro exams Labs pending UDS, ETOH, ammonia  TSH normal  Home meds reviewed for possible medication reactions, no areas of concern   Acute respiratory insufficiency Possible aspiration  - CXR with satisfactory ETT placement, no acute process. - CT chest questioning developing infiltrate in LLL but motion degraded P:  Full MV support, PRVC 8cc/kg, rate 18- ABG satisfactory On minimal Fio2/ PEEP support Daily SBT VAP bundle PAD protocol with prn fentanyl/ propofol as needed   Hypertensive crisis - prior TTE 12/2018 mild HFpEF, EF 55-60% P:  SBP goal 180 Prn labetalol May need to add cleviprex  Hold home lisinopril  Checking lipid panel    SIRS Leukocytosis P:  Follow UC, BC, and CSF cultures PCT pending S/p LP Check RVP for ck for viral syndrome Stool sample to be sent- GI panel/ Cdiff Continue empiric vanc, cefepine, and ceftriaxone 2gm coverage for now Trending WBC/ fever curve Prn acetaminophen and cooling measures    AKI  on CKD - previous sCr 1.07 AMGA/ lactic acidosis - likely type B, on metformin at home +/- type A w/ possible sepsis P:  LR at 100 ml/hr  Continue foley Trend BMP / urinary output Replace electrolytes as indicated Avoid nephrotoxic agents, ensure adequate renal perfusion   DM, poorly controlled Hyperglycemia - 5 Ketones on UA, AG 17 - A1c 10.9 P:  Insulin gtt per protocol  Hourly CBG/ Insulin gtt  Beta-hdroxybutyric level pending   Diarrhea - CT showing mild diffuse colitis and Cholelithiasis.  - LFTs mildly elevated  P:  RUQ ultrasound pending Trend LFTs Stool for GI panel/ Cdiff  Trending lactic   Best practice:  Diet: NPO Pain/Anxiety/Delirium protocol (if indicated): prn VAP protocol (if indicated): yes DVT prophylaxis: heparin SQ GI prophylaxis: PPI Glucose control: hourly/ insulin gtt Mobility: BR Code Status: full  Family Communication: Unable.  Attempted to call daughter, Chanell, and home number several times without answer.  Reportedly patient's husband is disabled and patient appears to be care provider.   Disposition: ICU  Labs   CBC: Recent Labs  Lab 09/09/19 1636 09/09/19 1720 09/09/19 2125  WBC 14.4*  --   --   NEUTROABS 13.1*  --   --   HGB 12.2 12.6 11.2*  HCT 36.4 37.0 33.0*  MCV 90.5  --   --   PLT 360  --   --     Basic Metabolic Panel: Recent Labs  Lab 09/09/19 1636 09/09/19 1720 09/09/19 1930 09/09/19 2110 09/09/19 2125  NA 139 139 138 139 138  K 4.6 4.5 4.7 5.1 4.7  CL 101  --  103 105  --   CO2 21*  --  19* 18*  --   GLUCOSE 522*  --  532* 491*  --   BUN 36*  --  36* 33*  --   CREATININE 1.87*  --  1.74* 1.82*  --   CALCIUM 10.0  --  9.5 9.1  --    GFR: Estimated Creatinine Clearance: 25.9 mL/min (A) (by C-G formula based on SCr of 1.82 mg/dL (H)). Recent Labs  Lab 09/09/19 1636 09/09/19 1930  WBC 14.4*  --   LATICACIDVEN 2.6* 3.0*    Liver Function Tests: Recent Labs  Lab 09/09/19 1636  AST 45*  ALT 51*   ALKPHOS 79  BILITOT 1.2  PROT 7.8  ALBUMIN 4.5   No results for input(s): LIPASE, AMYLASE in the last 168 hours. No results for input(s): AMMONIA in the last 168 hours.  ABG    Component Value Date/Time   PHART 7.441 01/03/2013 1520   PCO2ART 33.1 (L) 01/03/2013 1520   PO2ART 49.5 (L) 01/03/2013 1520   HCO3 17.4 (L) 09/09/2019 2125   TCO2 18 (L) 09/09/2019 2125   ACIDBASEDEF 6.0 (H) 09/09/2019 2125   O2SAT 99.0 09/09/2019 2125     Coagulation Profile: Recent Labs  Lab 09/09/19 1636  INR 1.1    Cardiac Enzymes: Recent Labs  Lab 09/09/19 1636  CKTOTAL 125    HbA1C: Hemoglobin A1C  Date/Time Value Ref Range Status  10/08/2018 03:41 PM 7.9 (A) 4.0 - 5.6 % Final    Comment:    CBG 54  results reported to [place] 1528p 10-08-18  05/07/2018 03:55 PM 9.1 (A) 4.0 - 5.6 % Final   HbA1c POC (<> result, manual entry)  Date/Time Value Ref Range Status  07/23/2018 02:11 PM >14.0 (A) 4.0 - 5.6 % Final   Hgb A1c MFr Bld  Date/Time Value Ref Range Status  09/09/2019 09:10 PM 10.9 (H) 4.8 - 5.6 % Final    Comment:    (NOTE) Pre diabetes:          5.7%-6.4% Diabetes:              >6.4% Glycemic control for   <7.0% adults with diabetes   01/02/2013 05:05 PM 15.8 (H) <5.7 % Final    Comment:    (NOTE)                                                                       According to the ADA Clinical Practice Recommendations for 2011, when HbA1c is used as a screening test:  >=6.5%   Diagnostic of Diabetes Mellitus           (if abnormal result is confirmed) 5.7-6.4%   Increased risk of developing Diabetes Mellitus References:Diagnosis and Classification of Diabetes Mellitus,Diabetes FVCB,4496,75(FFMBW 1):S62-S69 and Standards of Medical Care in  Diabetes - 2011,Diabetes Care,2011,34 (Suppl 1):S11-S61.    CBG: Recent Labs  Lab 09/09/19 1622 09/09/19 2036 09/09/19 2151 09/09/19 2259  GLUCAP 483* 483* 386* 332*    Review of Systems:   Unable  Past  Medical History  She,  has a past medical history of Allergy, Anemia, Arthritis, Cataract, Diabetes mellitus (1996), DKA (diabetic ketoacidoses) ([place]) (01/02/2013), Hyperlipidemia, Hypertension, Hypotension, Influenza A (H1N1) (01/03/2013), Neuromuscular disorder ([place]), and SYNCOPE (05/10/2010).   Surgical History    Past Surgical History:  Procedure Laterality Date  . ABDOMINAL HYSTERECTOMY     10 years ago   . BREAST BIOPSY    . Cataracts    . ROTATOR CUFF REPAIR    . SHOULDER SURGERY     Rotator cuff tear      Social History   reports that she has never smoked. She has never used smokeless tobacco. She reports that she does not drink alcohol or use drugs.   Family History   Her family history includes Aneurysm in her father; Breast cancer in her mother and sister; Diabetes in her mother and sister; Hypertension in her mother and sister; Kidney disease in her sister. There is no history of Colon cancer or Esophageal cancer.   Allergies Allergies  Allergen Reactions  . Gabapentin Other (See Comments)    Pt states medication made her feel "out of it".   Pt states medication made her feel "out of it".       Home Medications  Prior to Admission medications   Medication Sig Start Date End Date Taking? Authorizing Provider  ACCU-CHEK SOFTCLIX LANCETS lancets Check blood sugar 4x/day before meal an bedtime 08/28/18   Lorella Nimrod, MD  acetaminophen (TYLENOL) 325 MG tablet Take 2 tablets (650 mg total) by mouth every 6 (six) hours as needed (or Fever >/= 101). 01/05/13   Dorian Heckle, MD  aspirin EC 81 MG tablet Take 81 mg by mouth daily.    [provider]  atorvastatin (LIPITOR) 40 MG tablet Take 1 tablet (40 mg total) by mouth daily. 11/30/18 11/30/19  Forde Dandy, [name]  benzonatate (TESSALON) 100 MG capsule Take 1 capsule (100 mg total) by mouth every 8 (eight) hours. Patient taking differently: Take 100 mg by mouth every 8 (eight) hours as needed.  07/16/18   Loura Halt A, NP  Capsaicin-Menthol-Methyl Sal (CAPSAICIN-METHYL SAL-MENTHOL) 0.025-1-12 % CREA Apply 1 application topically as needed. Apply over painful areas 12/26/18   Mosetta Anis, MD  cetirizine (ZYRTEC) 5 MG tablet Take 1 tablet (5 mg total) by mouth daily. 03/18/19   [name], MD  Cholecalciferol (VITAMIN D3) 2000 units capsule Take by mouth daily.     [provider]  Continuous Blood Gluc Sensor (FREESTYLE LIBRE 14 DAY SENSOR) MISC 1 each by Does not apply route 4 (four) times daily. 08/28/18   Lorella Nimrod, MD  fluticasone (FLONASE) 50 MCG/ACT nasal spray Place 2 sprays into both nostrils daily. 03/18/19   [name], MD  glucose blood (ACCU-CHEK AVIVA PLUS) test strip Check blood sugar 4x/day before all meals and bedtime 08/28/18   Lorella Nimrod, MD  latanoprost (XALATAN) 0.005 % ophthalmic solution 1 drop at bedtime.    [provider]  lisinopril (ZESTRIL) 20 MG tablet TAKE 1 TABLET (20 MG TOTAL) BY MOUTH DAILY. 06/13/19   [name], MD  metFORMIN (GLUCOPHAGE-XR) 500 MG 24 hr tablet TAKE 2 TABLETS BY MOUTH 2 TIMES DAILY WITH A MEAL. 06/14/19   [name],  [name], MD  Multiple Vitamins-Minerals (WOMENS 50+ MULTI VITAMIN/MIN) TABS Take 1 tablet by mouth daily. 03/18/14   Lucious Groves, DO  Semaglutide (OZEMPIC, 0.25 OR 0.5 MG/DOSE, [place]) Inject 0.25 mg into the skin. Inject 0.25mg  weekly for four weeks then increase to 0.5mg  weekly    [provider]     I spent 60 minutes in direct patient care including reviewing data,  discussing with other providers, assessment, planning and stabilization and documentation. Time is exclusive to this patient and does not include procedures.    Kennieth Rad, MSN, AGACNP-BC [facility] Pgr: [phone] or if no answer [phone] 09/10/2019, 1:02 AM

## 2019-09-09 NOTE — ED Notes (Signed)
Pt continues to be unresponsive, not following commands, posturing, pt to be intubated and LP performed, family updated by MD

## 2019-09-09 NOTE — ED Notes (Signed)
Patient transported to CT 

## 2019-09-09 NOTE — ED Notes (Signed)
CBG Results of 386 reported to [place], [place].

## 2019-09-09 NOTE — Consult Note (Signed)
Neurology Consultation Reason for Consult: Altered mental status Referring Physician: Little, R  CC: Altered mental status  History is obtained from: Chart review  HPI: Amanda [name] is a 68 y.o. female with a history of diabetes who was found down after an unknown downtime.  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  Her car door and front door to the house were open.  She was brought into the emergency department where CBG was somewhat elevated at 43 and lactate was 2.6.  Due to concern for possible basilar thrombosis, she was taken for a CT angiogram of the head and neck which was negative.  She was having intermittent posturing type movements and therefore a stat EEG was obtained which did capture these movements and was negative for any type of ongoing seizure activity.  Due to her being severely encephalopathic, ED has decided that she needs to have airway protection and is proceeding with intubation.  LKW: Unclear tpa given?: no, unclear time of onset   ROS:  Unable to obtain due to altered mental status.   Past Medical History:  Diagnosis Date  . Allergy    SEASONAL  . Anemia   . Arthritis   . Cataract    PRESENT IN LEFT/ CATARACTS REMOVED FROM RIGHT  . Diabetes mellitus 1996  . DKA (diabetic ketoacidoses) ([place]) 01/02/2013  . Hyperlipidemia   . Hypertension   . Hypotension   . Influenza A (H1N1) 01/03/2013  . Neuromuscular disorder ([place])    NEUROPATHY  . SYNCOPE 05/10/2010   Qualifier: Diagnosis of  By: Marilynne Halsted RN, BSN, Jacquelyn       Family History  Problem Relation Age of Onset  . Diabetes Mother   . Hypertension Mother   . Breast cancer Mother   . Aneurysm Father   . Diabetes Sister   . Hypertension Sister   . Kidney disease Sister        One Kidney transplant, one sister on HD  . Breast cancer Sister   . Colon cancer Neg Hx   . Esophageal cancer Neg Hx      Social History:  reports that she has never smoked.  She has never used smokeless tobacco. She reports that she does not drink alcohol or use drugs.   Exam: Current vital signs: BP (!) 187/82   Pulse (!) 135   Temp (!) 104.3 F (40.2 C)   Resp 19   Ht 5\' 4"  (1.626 m)   Wt 61.3 kg   SpO2 100%   BMI 23.20 kg/m  Vital signs in last 24 hours: Temp:  [101.9 F (38.8 C)-104.3 F (40.2 C)] 104.3 F (40.2 C) (09/21 2255) Pulse Rate:  [124-139] 135 (09/21 2255) Resp:  [14-31] 19 (09/21 2255) BP: (142-207)/(82-155) 187/82 (09/21 2255) SpO2:  [97 %-100 %] 100 % (09/21 2255) FiO2 (%):  [100 %] 100 % (09/21 2240) Weight:  [61.3 kg] 61.3 kg (09/21 1700)   Physical Exam  Constitutional: Appears well-developed and well-nourished.  Psych: Affect appropriate to situation Eyes: No scleral injection HENT: No OP obstrucion Head: Normocephalic.  Cardiovascular: Normal rate and regular rhythm.  Respiratory: Effort normal, non-labored breathing GI: Soft.  No distension. There is no tenderness.  Skin: WDI  Neuro: Mental Status: Patient is obtunded, she does not open eyes or follow commands. Cranial Nerves: II: She does not blink to threat.  Eyes are 2 mm bilaterally, right is irregular, but both are reactive III,IV, VI: doll's  eye response intact V: VII: Corneals intact Motor: Flexions posturing bilaterally to noxious stimulation.  Sensory: As above.  Cerebellar: Does not perform   I have reviewed labs in epic and the results pertinent to this consultation are: Glucose 522 Creatinine 1.87 CK 125   I have reviewed the images obtained: CT/CTA-no acute findings  Impression: 68 year old female with acute onset encephalopathy of unclear origin.  Her exam is concerning for possible brainstem pathology, but the more likely this represents some form of toxic/metabolic encephalopathy.  Possible etiologies would be diabetic ketoacidosis, hepatic encephalopathy, overdose.  Recommendations: 1) LP being performed by ER, cells, glucose,  protein, cultures 2) MRI brain  3) Ammonia, TSH 4) UDS, blood cultures,  5) treat hyperglycemia 6) Neurology to follow.   Roland Rack, MD [facility] [phone]  If 7pm- 7am, please page neurology on call as listed in [place].

## 2019-09-09 NOTE — ED Provider Notes (Signed)
[facility] EMERGENCY DEPARTMENT Provider Note   CSN: [id number] Arrival date & time: 09/09/19  1546     History   Chief Complaint Chief Complaint  Patient presents with   Altered Mental Status    HPI Amanda [name] is a 68 y.o. female.     HPI  68 year old female with a medical history of diabetes mellitus who presents with AMS after being found down for an unknown period of time at home. Per EMS, the patient was found by her disabled husband on the floor at the edge of the bed. Her car door and the front door to her house was open. She has a history of DKA and neuropathy. Blood glucose on arrival 483.   The patient was contracting on arrival in her LUE. She would not respond to external stimuli and was covered in feces from head to toe.   Per the patient's mother over the phone, she started "acting funny" over the weekend, not quite herself. Her normal mental status is alert and oriented x3, follows commands, able to have a conversation. Her baseline neurologic status is normal.  Past Medical History:  Diagnosis Date   Allergy    SEASONAL   Anemia    Arthritis    Cataract    PRESENT IN LEFT/ CATARACTS REMOVED FROM RIGHT   Diabetes mellitus 1996   DKA (diabetic ketoacidoses) ([place]) 01/02/2013   Hyperlipidemia    Hypertension    Hypotension    Influenza A (H1N1) 01/03/2013   Neuromuscular disorder ([place])    NEUROPATHY   SYNCOPE 05/10/2010   Qualifier: Diagnosis of  By: Marilynne Halsted RN, BSN, Jacquelyn      Patient Active Problem List   Diagnosis Date Noted   Hypertensive emergency    Diabetic ketoacidosis with coma associated with type 2 diabetes mellitus ([place])    Acute encephalopathy 09/09/2019   Flu-like symptoms 02/06/2019   Gait abnormality 01/10/2019   Near syncope 11/26/2018   Nail fungus 10/23/2017   Cervical radiculopathy due to degenerative joint disease of spine 05/03/2017   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    Pain of both shoulder joints 11/14/2016   Orthostatic hypotension 09/15/2016   Vitreous hemorrhage of left eye ([place]) 05/13/2016   Osteopenia 05/04/2016   Posterior capsular opacification of left eye, obscuring vision 02/15/2016   MDD (major depressive disorder), recurrent episode, moderate ([place]) 02/08/2016   Malaise 11/24/2015   Pseudophakia of both eyes 07/27/2015   Constipation 09/16/2014   Healthcare maintenance 06/03/2013   Diabetic neuropathy ([place]) 05/20/2013   Type 2 diabetes mellitus with both eyes affected by proliferative retinopathy and macular edema, with long-term current use of insulin ([place]) 04/19/2013   Seasonal allergies 03/12/2013   Dyslipidemia 02/15/2013   DM II (diabetes mellitus, type II), controlled ([place]) 02/14/2013   Need for immunization against influenza 02/14/2013   Normocytic anemia 02/14/2013   Essential hypertension 05/10/2010    Past Surgical History:  Procedure Laterality Date   ABDOMINAL HYSTERECTOMY     10 years ago    BREAST BIOPSY     Cataracts     ROTATOR CUFF REPAIR     SHOULDER SURGERY     Rotator cuff tear      OB History    Gravida  1   Para  1   Term  1   Preterm      AB      Living  1     SAB      TAB  Ectopic      Multiple      Live Births               Home Medications    Prior to Admission medications   Medication Sig Start Date End Date Taking? Authorizing Provider  ACCU-CHEK SOFTCLIX LANCETS lancets Check blood sugar 4x/day before meal an bedtime 08/28/18   Lorella Nimrod, MD  acetaminophen (TYLENOL) 325 MG tablet Take 2 tablets (650 mg total) by mouth every 6 (six) hours as needed (or Fever >/= 101). 01/05/13   Dorian Heckle, MD  aspirin EC 81 MG tablet Take 81 mg by mouth daily.    [provider]  atorvastatin (LIPITOR) 40 MG tablet Take 1 tablet (40 mg total) by mouth daily. 11/30/18 11/30/19  Forde Dandy, [name]  benzonatate (TESSALON) 100 MG capsule Take 1  capsule (100 mg total) by mouth every 8 (eight) hours. Patient taking differently: Take 100 mg by mouth every 8 (eight) hours as needed.  07/16/18   Loura Halt A, NP  Capsaicin-Menthol-Methyl Sal (CAPSAICIN-METHYL SAL-MENTHOL) 0.025-1-12 % CREA Apply 1 application topically as needed. Apply over painful areas 12/26/18   Mosetta Anis, MD  cetirizine (ZYRTEC) 5 MG tablet Take 1 tablet (5 mg total) by mouth daily. 03/18/19   [name], MD  Cholecalciferol (VITAMIN D3) 2000 units capsule Take by mouth daily.     [provider]  Continuous Blood Gluc Sensor (FREESTYLE LIBRE 14 DAY SENSOR) MISC 1 each by Does not apply route 4 (four) times daily. 08/28/18   Lorella Nimrod, MD  fluticasone (FLONASE) 50 MCG/ACT nasal spray Place 2 sprays into both nostrils daily. 03/18/19   [name], MD  glucose blood (ACCU-CHEK AVIVA PLUS) test strip Check blood sugar 4x/day before all meals and bedtime 08/28/18   Lorella Nimrod, MD  latanoprost (XALATAN) 0.005 % ophthalmic solution 1 drop at bedtime.    [provider]  lisinopril (ZESTRIL) 20 MG tablet TAKE 1 TABLET (20 MG TOTAL) BY MOUTH DAILY. 06/13/19   [name], MD  metFORMIN (GLUCOPHAGE-XR) 500 MG 24 hr tablet TAKE 2 TABLETS BY MOUTH 2 TIMES DAILY WITH A MEAL. 06/14/19   [name], MD  Multiple Vitamins-Minerals (WOMENS 50+ MULTI VITAMIN/MIN) TABS Take 1 tablet by mouth daily. 03/18/14   Lucious Groves, DO  Semaglutide (OZEMPIC, 0.25 OR 0.5 MG/DOSE, [place]) Inject 0.25 mg into the skin. Inject 0.10m weekly for four weeks then increase to 0.569mweekly    [provider]    Family History Family History  Problem Relation Age of Onset   Diabetes Mother    Hypertension Mother    Breast cancer Mother    Aneurysm Father    Diabetes Sister    Hypertension Sister    Kidney disease Sister        One Kidney transplant, one sister on HD   Breast cancer Sister    Colon cancer Neg Hx    Esophageal cancer  Neg Hx     Social History Social History   Tobacco Use   Smoking status: Never Smoker   Smokeless tobacco: Never Used  Substance Use Topics   Alcohol use: No    Alcohol/week: 0.0 standard drinks   Drug use: No     Allergies   Gabapentin   Review of Systems Review of Systems  Unable to perform ROS: Mental status change     Physical Exam Updated Vital Signs BP (!) 140/92    Pulse (![name]  130    Temp (!) 104 F (40 C) (Rectal)    Resp (!) 27    Ht '5\' 4"'  (1.626 m)    Wt 56.3 kg    SpO2 100%    BMI 21.30 kg/m   Physical Exam Vitals signs and nursing note reviewed.  Constitutional:      General: She is not in acute distress.    Appearance: She is well-developed. She is ill-appearing and toxic-appearing.     Comments: GCS 9  HENT:     Head: Normocephalic and atraumatic.  Eyes:     Conjunctiva/sclera: Conjunctivae normal.  Neck:     Musculoskeletal: Neck supple.  Cardiovascular:     Rate and Rhythm: Normal rate and regular rhythm.     Heart sounds: No murmur.  Pulmonary:     Effort: Pulmonary effort is normal. No respiratory distress.     Breath sounds: Normal breath sounds.  Abdominal:     Palpations: Abdomen is soft.     Tenderness: There is no abdominal tenderness.  Skin:    General: Skin is warm and dry.  Neurological:     Mental Status: She is alert.     GCS: GCS eye subscore is 4. GCS verbal subscore is 1. GCS motor subscore is 4.     Cranial Nerves: Facial asymmetry present.     Motor: Abnormal muscle tone present.     Comments: LUE contracting in flexion with rigidity noted, moving all four extremities spontaneously. Eyes do not track. Pupils pinpoint and minimally reactive. Unable to assess coordination or gait.      ED Treatments / Results  Labs (all labs ordered are listed, but only abnormal results are displayed) Labs Reviewed  LACTIC ACID, PLASMA - Abnormal; Notable for the following components:      Result Value   Lactic Acid, Venous 2.6  (*)    All other components within normal limits  LACTIC ACID, PLASMA - Abnormal; Notable for the following components:   Lactic Acid, Venous 3.0 (*)    All other components within normal limits  COMPREHENSIVE METABOLIC PANEL - Abnormal; Notable for the following components:   CO2 21 (*)    Glucose, Bld 522 (*)    BUN 36 (*)    Creatinine, Ser 1.87 (*)    AST 45 (*)    ALT 51 (*)    GFR calc non Af Amer 27 (*)    GFR calc Af Amer 32 (*)    Anion gap 17 (*)    All other components within normal limits  CBC WITH DIFFERENTIAL/PLATELET - Abnormal; Notable for the following components:   WBC 14.4 (*)    Neutro Abs 13.1 (*)    Lymphs Abs 0.5 (*)    All other components within normal limits  URINALYSIS, ROUTINE W REFLEX MICROSCOPIC - Abnormal; Notable for the following components:   APPearance HAZY (*)    Glucose, UA >=500 (*)    Hgb urine dipstick SMALL (*)    Ketones, ur 5 (*)    Protein, ur >=300 (*)    All other components within normal limits  BASIC METABOLIC PANEL - Abnormal; Notable for the following components:   CO2 19 (*)    Glucose, Bld 532 (*)    BUN 36 (*)    Creatinine, Ser 1.74 (*)    GFR calc non Af Amer 30 (*)    GFR calc Af Amer 35 (*)    Anion gap 16 (*)    All  other components within normal limits  BASIC METABOLIC PANEL - Abnormal; Notable for the following components:   CO2 18 (*)    Glucose, Bld 491 (*)    BUN 33 (*)    Creatinine, Ser 1.82 (*)    GFR calc non Af Amer 28 (*)    GFR calc Af Amer 33 (*)    Anion gap 16 (*)    All other components within normal limits  BASIC METABOLIC PANEL - Abnormal; Notable for the following components:   CO2 19 (*)    Glucose, Bld 337 (*)    BUN 31 (*)    Creatinine, Ser 1.80 (*)    GFR calc non Af Amer 29 (*)    GFR calc Af Amer 33 (*)    All other components within normal limits  HEMOGLOBIN A1C - Abnormal; Notable for the following components:   Hgb A1c MFr Bld 10.9 (*)    All other components within normal  limits  PROTEIN AND GLUCOSE, CSF - Abnormal; Notable for the following components:   Glucose, CSF 283 (*)    Total  Protein, CSF 104 (*)    All other components within normal limits  MAGNESIUM - Abnormal; Notable for the following components:   Magnesium 1.5 (*)    All other components within normal limits  BETA-HYDROXYBUTYRIC ACID - Abnormal; Notable for the following components:   Beta-Hydroxybutyric Acid 0.52 (*)    All other components within normal limits  GLUCOSE, CAPILLARY - Abnormal; Notable for the following components:   Glucose-Capillary 258 (*)    All other components within normal limits  GLUCOSE, CAPILLARY - Abnormal; Notable for the following components:   Glucose-Capillary 180 (*)    All other components within normal limits  CBG MONITORING, ED - Abnormal; Notable for the following components:   Glucose-Capillary 483 (*)    All other components within normal limits  POCT I-STAT EG7 - Abnormal; Notable for the following components:   pCO2, Ven 40.2 (*)    pO2, Ven 66.0 (*)    Acid-base deficit 4.0 (*)    All other components within normal limits  CBG MONITORING, ED - Abnormal; Notable for the following components:   Glucose-Capillary 483 (*)    All other components within normal limits  POCT I-STAT EG7 - Abnormal; Notable for the following components:   pH, Ven 7.431 (*)    pCO2, Ven 26.1 (*)    pO2, Ven 141.0 (*)    Bicarbonate 17.4 (*)    TCO2 18 (*)    Acid-base deficit 6.0 (*)    Calcium, Ion 1.10 (*)    HCT 33.0 (*)    Hemoglobin 11.2 (*)    All other components within normal limits  CBG MONITORING, ED - Abnormal; Notable for the following components:   Glucose-Capillary 386 (*)    All other components within normal limits  CBG MONITORING, ED - Abnormal; Notable for the following components:   Glucose-Capillary 332 (*)    All other components within normal limits  CBG MONITORING, ED - Abnormal; Notable for the following components:   Glucose-Capillary  313 (*)    All other components within normal limits  POCT I-STAT 7, (LYTES, BLD GAS, ICA,H+H) - Abnormal; Notable for the following components:   pO2, Arterial 370.0 (*)    Acid-base deficit 3.0 (*)    HCT 31.0 (*)    Hemoglobin 10.5 (*)    All other components within normal limits  POCT I-STAT EG7 - Abnormal; Notable  for the following components:   pCO2, Ven 28.7 (*)    pO2, Ven 52.0 (*)    Bicarbonate 17.6 (*)    TCO2 18 (*)    Acid-base deficit 6.0 (*)    Calcium, Ion 1.10 (*)    HCT 33.0 (*)    Hemoglobin 11.2 (*)    All other components within normal limits  SARS CORONAVIRUS 2 (HOSPITAL ORDER, [facility])  CSF CULTURE  CULTURE, BLOOD (ROUTINE X 2)  CULTURE, BLOOD (ROUTINE X 2)  URINE CULTURE  C DIFFICILE QUICK SCREEN W PCR REFLEX  RESPIRATORY PANEL BY PCR  MRSA PCR SCREENING  APTT  PROTIME-INR  CK  TSH  PROCALCITONIN  ETHANOL  BASIC METABOLIC PANEL  CSF CELL COUNT WITH DIFFERENTIAL  CSF CELL COUNT WITH DIFFERENTIAL  HERPES SIMPLEX VIRUS(HSV) DNA BY PCR  BLOOD GAS, ARTERIAL  GI PATHOGEN PANEL BY PCR, STOOL  HIV ANTIBODY (ROUTINE TESTING W REFLEX)  RAPID URINE DRUG SCREEN, HOSP PERFORMED  VANCOMYCIN, RANDOM  T4, FREE  LIPID PANEL  CK  COMPREHENSIVE METABOLIC PANEL  CBC  AMMONIA  LACTIC ACID, PLASMA  I-STAT VENOUS BLOOD GAS, ED  I-STAT VENOUS BLOOD GAS, ED  I-STAT VENOUS BLOOD GAS, ED    EKG EKG Interpretation  Date/Time:  Monday September 09 2019 16:24:35 EDT Ventricular Rate:  124 PR Interval:    QRS Duration: 81 QT Interval:  310 QTC Calculation: 446 R Axis:   74 Text Interpretation:  Sinus tachycardia Consider right atrial enlargement Borderline repolarization abnormality Artifact in lead(s) I II III aVR aVL aVF V1 V2 V5 V6 Interpretation limited secondary to artifact tachycardia new from previous Confirmed by Theotis Burrow [phone]) on 09/09/2019 4:50:15 PM   Radiology Ct Abdomen Pelvis Wo Contrast  Result Date:  09/09/2019 CLINICAL DATA:  68 year old female with shortness of breath. Patient was found on the floor. EXAM: CT CHEST, ABDOMEN AND PELVIS WITHOUT CONTRAST TECHNIQUE: Multidetector CT imaging of the chest, abdomen and pelvis was performed following the standard protocol without IV contrast. COMPARISON:  CT of the abdomen pelvis dated 12/26/2017 FINDINGS: Evaluation of this exam is limited in the absence of intravenous contrast. Evaluation is also limited due to respiratory motion artifact as well as streak artifact caused by patient's arms. CT CHEST FINDINGS Cardiovascular: There is no cardiomegaly or pericardial effusion. There is coronary vascular calcification. The thoracic aorta and central pulmonary arteries are grossly unremarkable on this noncontrast CT. Mediastinum/Nodes: No hilar or mediastinal adenopathy. The esophagus and the thyroid gland are grossly unremarkable. No mediastinal fluid collection. Lungs/Pleura: Evaluation of the lungs is somewhat limited due to respiratory motion artifact. Focal area of increased hazy density at the left lower lobe (series 4, image 79 and coronal series 5, image 61) may be artifactual and represent respiratory motion. Developing infiltrate is not excluded. Clinical correlation is recommended. No focal consolidation, pleural effusion, or pneumothorax. Mucus secretions noted within the trachea. The central airways otherwise remain patent. Musculoskeletal: No chest wall mass or suspicious bone lesions identified. CT ABDOMEN PELVIS FINDINGS No intra-abdominal free air or free fluid. Hepatobiliary: The liver is grossly unremarkable as visualized. No intrahepatic biliary ductal dilatation. There is layering stone and sludge within the gallbladder. Evaluation of the gallbladder is otherwise very limited due to respiratory motion and streak artifact caused by patient's arms. Ultrasound is recommended for better evaluation of the gallbladder. Pancreas: The pancreas is grossly  unremarkable as visualized. Spleen: Normal in size without focal abnormality. Adrenals/Urinary Tract: The adrenal glands are  unremarkable. There is no hydronephrosis or nephrolithiasis on either side. Subcentimeter left renal upper pole hypodense lesions are not characterized. The visualized ureters and urinary bladder appear unremarkable. Stomach/Bowel: Mild diffuse thickened appearance of the colon likely related to underdistention and respiratory motion artifact. Mild colitis is not entirely excluded. Clinical correlation is recommended. There is no bowel obstruction. The appendix is unremarkable as visualized. Vascular/Lymphatic: Mild aortoiliac atherosclerotic disease. The IVC is unremarkable. No portal venous gas. There is no adenopathy. Reproductive: The uterus is suboptimally visualized, hysterectomy. Other: Midline vertical anterior pelvic wall incisional scar. Musculoskeletal: Degenerative changes of the spine. No acute osseous pathology. IMPRESSION: 1. Respiratory motion artifact versus less likely developing infiltrate in the left lower lobe. Clinical correlation is recommended. 2. Artifact versus mild colitis. Clinical correlation is recommended. No bowel obstruction. 3. Cholelithiasis. Ultrasound is recommended for better evaluation of the gallbladder. Aortic Atherosclerosis (ICD10-I70.0). Electronically Signed   By: Anner Crete M.D.   On: 09/09/2019 19:31   Ct Angio Head W Or Wo Contrast  Result Date: 09/09/2019 CLINICAL DATA:  Unresponsive. EXAM: CT ANGIOGRAPHY HEAD AND NECK TECHNIQUE: Multidetector CT imaging of the head and neck was performed using the standard protocol during bolus administration of intravenous contrast. Multiplanar CT image reconstructions and MIPs were obtained to evaluate the vascular anatomy. Carotid stenosis measurements (when applicable) are obtained utilizing NASCET criteria, using the distal internal carotid diameter as the denominator. CONTRAST:  66m OMNIPAQUE  IOHEXOL 350 MG/ML SOLN COMPARISON:  CT head 09/09/2019 FINDINGS: CTA NECK FINDINGS Aortic arch: Standard branching. Imaged portion shows no evidence of aneurysm or dissection. No significant stenosis of the major arch vessel origins. Right carotid system: Heavily calcified plaque right carotid bifurcation. Right internal carotid artery lumen narrowed by 60% diameter stenosis. Moderate narrowing of the proximal right external carotid artery. Left carotid system: Mild atherosclerotic disease left carotid bulb without significant stenosis. Vertebral arteries: Both vertebral arteries patent to the basilar. Mild stenosis at the skull base otherwise widely patent Skeleton: No acute skeletal abnormality. Ossification in the ligamentum flavum on the right at C6-7 could represent a meningioma. This measures approximately 6 x 10 mm. Other neck: Negative for mass or adenopathy. The patient is intubated. Upper chest: Lung apices clear bilaterally. Review of the MIP images confirms the above findings CTA HEAD FINDINGS Anterior circulation: Extensive calcification in the cavernous carotid bilaterally. Moderate stenosis bilaterally. Anterior and middle cerebral arteries patent bilaterally. Mild stenosis right M1 segment. Left middle cerebral artery patent without stenosis. Anterior cerebral arteries patent bilaterally. Posterior circulation: Atherosclerotic calcification and mild stenosis in both vertebral arteries at the skull base. Both vertebral arteries patent to the basilar. PICA patent bilaterally. Basilar widely patent. Superior cerebellar and posterior cerebral arteries patent bilaterally without significant stenosis. Venous sinuses: Normal venous enhancement. Anatomic variants: None Review of the MIP images confirms the above findings IMPRESSION: 1. 60% diameter stenosis proximal right internal carotid artery due to heavily calcified plaque. Moderate stenosis right cavernous carotid. 2. Mild atherosclerotic calcification  left carotid bulb without stenosis. Moderate stenosis left cavernous carotid due to calcific stenosis. 3. Mild stenosis of the vertebral artery bilaterally at the skull base otherwise clear 4. No intracranial large vessel occlusion. Mild stenosis right M1 segment. 5. 6 x 10 mm ossification ligamentum flavum on the right at C6-7 may represent meningioma. Electronically Signed   By: CFranchot GalloM.D.   On: 09/09/2019 21:15   Ct Head Wo Contrast  Result Date: 09/09/2019 CLINICAL DATA:  Altered mental status, found down EXAM:  CT HEAD WITHOUT CONTRAST TECHNIQUE: Contiguous axial images were obtained from the base of the skull through the vertex without intravenous contrast. COMPARISON:  11/27/2013 FINDINGS: Brain: No evidence of acute infarction, hemorrhage, hydrocephalus, extra-axial collection or mass lesion/mass effect. Vascular: Mild atherosclerotic calcifications involving the large vessels of the skull base. No unexpected hyperdense vessel. Skull: Normal. Negative for fracture or focal lesion. Sinuses/Orbits: No acute finding. Other: None. IMPRESSION: No acute intracranial findings. Electronically Signed   By: Davina Poke M.D.   On: 09/09/2019 19:15   Ct Angio Neck W Or Wo Contrast  Result Date: 09/09/2019 CLINICAL DATA:  Unresponsive. EXAM: CT ANGIOGRAPHY HEAD AND NECK TECHNIQUE: Multidetector CT imaging of the head and neck was performed using the standard protocol during bolus administration of intravenous contrast. Multiplanar CT image reconstructions and MIPs were obtained to evaluate the vascular anatomy. Carotid stenosis measurements (when applicable) are obtained utilizing NASCET criteria, using the distal internal carotid diameter as the denominator. CONTRAST:  73m OMNIPAQUE IOHEXOL 350 MG/ML SOLN COMPARISON:  CT head 09/09/2019 FINDINGS: CTA NECK FINDINGS Aortic arch: Standard branching. Imaged portion shows no evidence of aneurysm or dissection. No significant stenosis of the major arch  vessel origins. Right carotid system: Heavily calcified plaque right carotid bifurcation. Right internal carotid artery lumen narrowed by 60% diameter stenosis. Moderate narrowing of the proximal right external carotid artery. Left carotid system: Mild atherosclerotic disease left carotid bulb without significant stenosis. Vertebral arteries: Both vertebral arteries patent to the basilar. Mild stenosis at the skull base otherwise widely patent Skeleton: No acute skeletal abnormality. Ossification in the ligamentum flavum on the right at C6-7 could represent a meningioma. This measures approximately 6 x 10 mm. Other neck: Negative for mass or adenopathy. The patient is intubated. Upper chest: Lung apices clear bilaterally. Review of the MIP images confirms the above findings CTA HEAD FINDINGS Anterior circulation: Extensive calcification in the cavernous carotid bilaterally. Moderate stenosis bilaterally. Anterior and middle cerebral arteries patent bilaterally. Mild stenosis right M1 segment. Left middle cerebral artery patent without stenosis. Anterior cerebral arteries patent bilaterally. Posterior circulation: Atherosclerotic calcification and mild stenosis in both vertebral arteries at the skull base. Both vertebral arteries patent to the basilar. PICA patent bilaterally. Basilar widely patent. Superior cerebellar and posterior cerebral arteries patent bilaterally without significant stenosis. Venous sinuses: Normal venous enhancement. Anatomic variants: None Review of the MIP images confirms the above findings IMPRESSION: 1. 60% diameter stenosis proximal right internal carotid artery due to heavily calcified plaque. Moderate stenosis right cavernous carotid. 2. Mild atherosclerotic calcification left carotid bulb without stenosis. Moderate stenosis left cavernous carotid due to calcific stenosis. 3. Mild stenosis of the vertebral artery bilaterally at the skull base otherwise clear 4. No intracranial large  vessel occlusion. Mild stenosis right M1 segment. 5. 6 x 10 mm ossification ligamentum flavum on the right at C6-7 may represent meningioma. Electronically Signed   By: CFranchot GalloM.D.   On: 09/09/2019 21:15   Ct Chest Wo Contrast  Result Date: 09/09/2019 CLINICAL DATA:  68year old female with shortness of breath. Patient was found on the floor. EXAM: CT CHEST, ABDOMEN AND PELVIS WITHOUT CONTRAST TECHNIQUE: Multidetector CT imaging of the chest, abdomen and pelvis was performed following the standard protocol without IV contrast. COMPARISON:  CT of the abdomen pelvis dated 12/26/2017 FINDINGS: Evaluation of this exam is limited in the absence of intravenous contrast. Evaluation is also limited due to respiratory motion artifact as well as streak artifact caused by patient's arms. CT CHEST FINDINGS Cardiovascular:  There is no cardiomegaly or pericardial effusion. There is coronary vascular calcification. The thoracic aorta and central pulmonary arteries are grossly unremarkable on this noncontrast CT. Mediastinum/Nodes: No hilar or mediastinal adenopathy. The esophagus and the thyroid gland are grossly unremarkable. No mediastinal fluid collection. Lungs/Pleura: Evaluation of the lungs is somewhat limited due to respiratory motion artifact. Focal area of increased hazy density at the left lower lobe (series 4, image 79 and coronal series 5, image 61) may be artifactual and represent respiratory motion. Developing infiltrate is not excluded. Clinical correlation is recommended. No focal consolidation, pleural effusion, or pneumothorax. Mucus secretions noted within the trachea. The central airways otherwise remain patent. Musculoskeletal: No chest wall mass or suspicious bone lesions identified. CT ABDOMEN PELVIS FINDINGS No intra-abdominal free air or free fluid. Hepatobiliary: The liver is grossly unremarkable as visualized. No intrahepatic biliary ductal dilatation. There is layering stone and sludge  within the gallbladder. Evaluation of the gallbladder is otherwise very limited due to respiratory motion and streak artifact caused by patient's arms. Ultrasound is recommended for better evaluation of the gallbladder. Pancreas: The pancreas is grossly unremarkable as visualized. Spleen: Normal in size without focal abnormality. Adrenals/Urinary Tract: The adrenal glands are unremarkable. There is no hydronephrosis or nephrolithiasis on either side. Subcentimeter left renal upper pole hypodense lesions are not characterized. The visualized ureters and urinary bladder appear unremarkable. Stomach/Bowel: Mild diffuse thickened appearance of the colon likely related to underdistention and respiratory motion artifact. Mild colitis is not entirely excluded. Clinical correlation is recommended. There is no bowel obstruction. The appendix is unremarkable as visualized. Vascular/Lymphatic: Mild aortoiliac atherosclerotic disease. The IVC is unremarkable. No portal venous gas. There is no adenopathy. Reproductive: The uterus is suboptimally visualized, hysterectomy. Other: Midline vertical anterior pelvic wall incisional scar. Musculoskeletal: Degenerative changes of the spine. No acute osseous pathology. IMPRESSION: 1. Respiratory motion artifact versus less likely developing infiltrate in the left lower lobe. Clinical correlation is recommended. 2. Artifact versus mild colitis. Clinical correlation is recommended. No bowel obstruction. 3. Cholelithiasis. Ultrasound is recommended for better evaluation of the gallbladder. Aortic Atherosclerosis (ICD10-I70.0). Electronically Signed   By: Anner Crete M.D.   On: 09/09/2019 19:31   Dg Chest Portable 1 View  Result Date: 09/09/2019 CLINICAL DATA:  Intubation. EXAM: PORTABLE CHEST 1 VIEW COMPARISON:  Radiographs and CT earlier this day. FINDINGS: Endotracheal tube tip 5.4 cm from the carina at the thoracic inlet. The cardiomediastinal contours are normal. No definite  consolidation at the left lung base. Pulmonary vasculature is normal. No confluent consolidation, pleural effusion, or pneumothorax. No acute osseous abnormalities are seen. Overlying artifact obscures the right costophrenic angle. Presumed external artifact in the right supraclavicular region. IMPRESSION: 1. Endotracheal tube tip 5.4 cm from the carina at the thoracic inlet. 2. No acute chest findings. Electronically Signed   By: Keith Rake M.D.   On: 09/09/2019 22:50   Dg Chest Port 1 View  Result Date: 09/09/2019 CLINICAL DATA:  Altered mental status, fever EXAM: PORTABLE CHEST 1 VIEW COMPARISON:  01/03/2013 FINDINGS: The heart size and mediastinal contours are within normal limits. No focal airspace consolidation. No pleural effusion. No pneumothorax. IMPRESSION: No acute cardiopulmonary findings. Electronically Signed   By: Davina Poke M.D.   On: 09/09/2019 17:10    Procedures Procedure Name: Intubation Date/Time: 09/09/2019 10:45 PM Performed by: Regan Lemming, MD Pre-anesthesia Checklist: Patient identified, Emergency Drugs available, Suction available, Patient being monitored and Timeout performed Oxygen Delivery Method: Non-rebreather mask Preoxygenation: Pre-oxygenation with 100% oxygen Induction  Type: Rapid sequence Laryngoscope Size: Glidescope and 3 Grade View: Grade II Tube size: 7.5 mm Number of attempts: 1 Airway Equipment and Method: Patient positioned with wedge pillow,  Rigid stylet and Video-laryngoscopy Placement Confirmation: ETT inserted through vocal cords under direct vision,  CO2 detector and Breath sounds checked- equal and bilateral Secured at: 25 cm Tube secured with: ETT holder    .Critical Care Performed by: Regan Lemming, MD Authorized by: Regan Lemming, MD   Critical care provider statement:    Critical care time (minutes):  40   Critical care start time:  09/09/2019 4:30 PM   Critical care end time:  09/09/2019 11:15 PM   Critical care  was necessary to treat or prevent imminent or life-threatening deterioration of the following conditions:  Sepsis and CNS failure or compromise .Lumbar Puncture  Date/Time: 09/09/2019 11:10 PM Performed by: Regan Lemming, MD Authorized by: Regan Lemming, MD   Consent:    Consent obtained:  Verbal   Consent given by:  Parent   Risks discussed:  Bleeding, infection, nerve damage and headache   Alternatives discussed:  No treatment Pre-procedure details:    Procedure purpose:  Diagnostic   Preparation: Patient was prepped and draped in usual sterile fashion   Sedation:    Sedation type:  Deep Procedure details:    Lumbar space:  L4-L5 interspace   Patient position:  R lateral decubitus   Needle gauge:  22   Needle type:  Spinal needle - Quincke tip   Needle length (in):  1.5   Ultrasound guidance: no     Number of attempts:  3   Opening pressure (cm H2O):  14   Fluid appearance:  Blood-tinged then clearing   Tubes of fluid:  4   Total volume (ml):  5 Post-procedure:    Puncture site:  Adhesive bandage applied and direct pressure applied   Patient tolerance of procedure:  Tolerated well, no immediate complications   (including critical care time)  Medications Ordered in ED Medications  ceFEPIme (MAXIPIME) 2 g in sodium chloride 0.9 % 100 mL IVPB (has no administration in time range)  vancomycin variable dose per unstable renal function (pharmacist dosing) (has no administration in time range)  dextrose 5 %-0.45 % sodium chloride infusion ( Intravenous Hold 09/09/19 2107)  insulin regular, human (MYXREDLIN) 100 units/ 100 mL infusion (4.8 Units/hr Intravenous Rate/Dose Change 09/10/19 0222)  propofol (DIPRIVAN) 1000 MG/100ML infusion (10 mcg/kg/min  61.3 kg Intravenous New Bag/Given 09/09/19 2244)  heparin injection 5,000 Units (has no administration in time range)  chlorhexidine gluconate (MEDLINE KIT) (PERIDEX) 0.12 % solution 15 mL (has no administration in time range)   MEDLINE mouth rinse (15 mLs Mouth Rinse Given 09/10/19 0128)  pantoprazole (PROTONIX) injection 40 mg (has no administration in time range)  fentaNYL (SUBLIMAZE) injection 25 mcg (has no administration in time range)  fentaNYL (SUBLIMAZE) injection 25-100 mcg (has no administration in time range)  Chlorhexidine Gluconate Cloth 2 % PADS 6 each (has no administration in time range)  lactated ringers infusion (100 mL/hr Intravenous New Bag/Given 09/10/19 0114)  labetalol (NORMODYNE) injection 20 mg (has no administration in time range)  magnesium sulfate IVPB 2 g 50 mL (has no administration in time range)  midazolam (VERSED) injection 2 mg (has no administration in time range)  midazolam (VERSED) 2 MG/2ML injection (has no administration in time range)  lactated ringers bolus 1,000 mL (0 mLs Intravenous Stopped 09/09/19 2159)    And  lactated ringers bolus 500 mL (  0 mLs Intravenous Stopped 09/09/19 2100)    And  lactated ringers bolus 250 mL (0 mLs Intravenous Stopped 09/09/19 2059)  ceFEPIme (MAXIPIME) 2 g in sodium chloride 0.9 % 100 mL IVPB (0 g Intravenous Stopped 09/09/19 1736)  metroNIDAZOLE (FLAGYL) IVPB 500 mg (0 mg Intravenous Stopped 09/09/19 1900)  vancomycin (VANCOCIN) IVPB 1000 mg/200 mL premix (0 mg Intravenous Stopped 09/09/19 1900)  acetaminophen (TYLENOL) suppository 650 mg (650 mg Rectal Given 09/09/19 1722)  lactated ringers bolus 1,000 mL (0 mLs Intravenous Stopped 09/09/19 2211)  potassium chloride 10 mEq in 100 mL IVPB (0 mEq Intravenous Stopped 09/09/19 2207)  iohexol (OMNIPAQUE) 350 MG/ML injection 75 mL (75 mLs Intravenous Contrast Given 09/09/19 2054)  cefTRIAXone (ROCEPHIN) 2 g in sodium chloride 0.9 % 100 mL IVPB (0 g Intravenous Stopped 09/09/19 2159)  etomidate (AMIDATE) injection (18 mg Intravenous Given 09/09/19 2226)  succinylcholine (ANECTINE) injection (130 mg Intravenous Given 09/09/19 2227)  acetaminophen (TYLENOL) suppository 650 mg (650 mg Rectal Given 09/09/19 2245)   acetaminophen (OFIRMEV) IV 1,000 mg (1,000 mg Intravenous New Bag/Given 09/10/19 0210)     Initial Impression / Assessment and Plan / ED Course  I have reviewed the triage vital signs and the nursing notes.  Pertinent labs & imaging results that were available during my care of the patient were reviewed by me and considered in my medical decision making (see chart for details).  Clinical Course as of Sep 10 243  Mon Sep 09, 2019  1628 Temp(!): 101.9 F (38.8 C) [JL]  1628 Pulse Rate(!): 127 [JL]  1628 BP(!): 207/104 [JL]  1628 Glucose-Capillary(!): 483 [JL]  1755 Lactic Acid, Venous(!!): 2.6 [JL]  1758 WBC(!): 14.4 [JL]  1811 Anion gap(!): 17 [JL]  1811 CO2(!): 21 [JL]  1811 Glucose(!!): 522 [JL]  1811 Creatinine(!): 1.87 [JL]    Clinical Course User Index [JL] Regan Lemming, MD       This is a 68 year old female with a history of DM2 and DKA who presents to the ED with hyperthermia and altered mental status. On arrival, the patient was hypertensive, BP 207/104, febrile to 101.9 (Tmax 105.36F later during ED stay), tachycardic 125, RR 22. She was a GCS 9 on arrival, eye subscore 4, verbal subscore 1, motor subscore 4.   On arrival, CODE SEPSIS was initiated and the patient was administered 30cc/kg LR and Vancomycin, Cefipime, and Flagyl for empiric coverage. Bloods cultures and labs were drawn. COVID-19 testing was drawn. Initial blood glucose on arrival 483, lactic acid 2.6, bicarb 21, AG 17 concerning for developing DKA vs lactic acidosis from sepsis. We will treat her with fluids as per our sepsis protocol and see if her lactic acidosis clears.  WBC elevated to 14.4 concerning for ongoing infectious process. Cr at 1.87 (elevated from baseline of around 1-1.3) concerning for an AKI.   DDx: Sepsis from unclear source (PNA, UTI, Abdominal source, meningitis, viral syndrome), Encephalitis, CVA, ICH, toxic/metabolic derangement, hypertensive encephalopathy, drug overdose,  seizures.  UA without evidence of urosepsis. CK normal. COVID-19 was negative. VBG with a pH of 7.34, pCO2 of 40, pO2 66, bicarb of 22. Repeat lactic acid elevated to 3.0. Repeat BMP with an elevated AG to 16, BG 532, bicarb 19. Concern for developing DKA. Will treat with IVFs and insulin gtt. K 4.6.   A CXR was negative for acute findings. The patient underwent CT imaging that revealed a CT Head WO without acute intracranial findings, CT Chest Abdomen Pelvis WO with possible developing LLL  infiltrate and possible mild colitis. Cholelithiasis present.   On re-evaluation, the patient's GCS and overall clinical status showed evidence of decline. Dr. Leonel Ramsay of neurology was consulted to evaluate for possible CVA vs subclinical seizures/status epilepticus. A STAT CTA of the Head and Neck was negative for acute findings. A STAT EEG was performed which did not reveal evidence of seizure activity but did show evidence of global encephalopathy. Incidental findings of a possible 6x10 ossification of the ligamentum flavum on the R at C6-C7 which may represent a meningioma. Mild-moderate stenosis but no occlusive disease. Per neurology assessment, patient is with "acute onset encephalopathy of unclear origin.  Her exam is concerning for possible brainstem pathology, but the more likely this represents some form of toxic/metabolic encephalopathy.  Possible etiologies would be diabetic ketoacidosis, hepatic encephalopathy, overdose."  Given the patient's worsening mental status with several episodes of desaturations in the 80s requiring initial support with a non-rebreather, the decision was made to intubate for airway protection.   Additional concern for possible CNS infection. Rocephin added. A subsequent lumbar puncture was performed for CSF analysis. CSF was noted to be clear and non-purulent on LP. C. Diff and GIP ordered due to history of diarrhea.   Spoke to Dr. Oletta Darter of MICU for an admission for acute  encephalopathy. The patient was subsequently admitted in critical condition.  Final Clinical Impressions(s) / ED Diagnoses   - Hypertension (concern for hypertensive emergency in setting of AKI) - Sepsis and Lactic Acidosis - Acute Encephalopathy - Fever and Altered Mental Status - Hyperglycemia - Anion Gap Metabolic Acidosis - AKI - Acute Diarrhea  ED Discharge Orders    None       Regan Lemming, MD 09/10/19 0256    Rex Kras Wenda Overland, MD 09/11/19 [phone]

## 2019-09-09 NOTE — ED Notes (Signed)
Pt arrives covered in feces.

## 2019-09-09 NOTE — Procedures (Signed)
History: 68 year old female being evaluated for altered mental status  Sedation: None  Technique: This is a 21 channel routine scalp EEG performed at the bedside with bipolar and monopolar montages arranged in accordance to the international 10/20 system of electrode placement. One channel was dedicated to EKG recording.    Background: The background consists of generalized irregular low voltage delta activity with a range of 2 to 3 Hz.  There are occasional bifrontally predominant discharges with triphasic morphology without periodicity.  Photic stimulation: Physiologic driving is not performed  EEG Abnormalities: 1) triphasic waves  2) generalized irregular slow activity 3) absent PDR   Clinical Interpretation: This EEG is consistent with a generalized nonspecific cerebral dysfunction (encephalopathy). There was no seizure or seizure predisposition recorded on this study. Please note that lack of epileptiform activity on EEG does not preclude the possibility of epilepsy.   Roland Rack, MD [facility] [phone]  If 7pm- 7am, please page neurology on call as listed in [place].

## 2019-09-09 NOTE — Progress Notes (Signed)
Paged for admission. Presented to bedside to evaluate the patient. GCS calculated at 3. SpO2 noted to be 86% on evaluation. On physical exam she has bilateral upper extremity rigidity, pin point pupils, clenched jaw, and right facial spasms. Spoke with the ED MD. Have consulted neurology and the ED MD will consult PCCM given patient's inability to protect her airway.   Ina Homes, MD  [facility] PGY 3

## 2019-09-09 NOTE — Progress Notes (Signed)
Notified bedside nurse of need to draw repeat lactic acid @ 1900.  

## 2019-09-09 NOTE — Progress Notes (Signed)
Pharmacy Antibiotic Note  Amanda [name] is a 68 y.o. female admitted on 09/09/2019 with sepsis.  Pharmacy has been consulted for Cefepime and Vancomycin dosing.  Patient with AKI.  Plan: Cefepime 2 grams IV q24hr Vanc variable dosing per pharmacy Monitor renal function, C&S, clinical status and vanc levels as needed  Weight: 135 lb 2.3 oz (61.3 kg)  Temp (24hrs), Avg:101.9 F (38.8 C), Min:101.9 F (38.8 C), Max:101.9 F (38.8 C)  Recent Labs  Lab 09/09/19 1636  WBC 14.4*  CREATININE 1.87*  LATICACIDVEN 2.6*    CrCl cannot be calculated (Unknown ideal weight.).    Allergies  Allergen Reactions  . Gabapentin Other (See Comments)    Pt states medication made her feel "out of it".   Pt states medication made her feel "out of it".      Antimicrobials this admission: Vanc 9/21 >>  Cefepime 9/21>>   Thank you for allowing pharmacy to be a part of this patient's care.  Alanda Slim, [name], [facility] Clinical Pharmacist Please see AMION for all Pharmacists' Contact Phone Numbers 09/09/2019, 6:37 PM

## 2019-09-09 NOTE — ED Notes (Signed)
CBG Results of 483 reported to [place], Therapist, sports.

## 2019-09-10 ENCOUNTER — Inpatient Hospital Stay (HOSPITAL_COMMUNITY): Payer: Medicare Other

## 2019-09-10 DIAGNOSIS — E1111 Type 2 diabetes mellitus with ketoacidosis with coma: Secondary | ICD-10-CM | POA: Insufficient documentation

## 2019-09-10 DIAGNOSIS — I161 Hypertensive emergency: Secondary | ICD-10-CM | POA: Insufficient documentation

## 2019-09-10 DIAGNOSIS — J9601 Acute respiratory failure with hypoxia: Secondary | ICD-10-CM

## 2019-09-10 DIAGNOSIS — S062X4A Diffuse traumatic brain injury with loss of consciousness of 6 hours to 24 hours, initial encounter: Secondary | ICD-10-CM

## 2019-09-10 LAB — GLUCOSE, CAPILLARY
Glucose-Capillary: 109 mg/dL — ABNORMAL HIGH (ref 70–99)
Glucose-Capillary: 139 mg/dL — ABNORMAL HIGH (ref 70–99)
Glucose-Capillary: 144 mg/dL — ABNORMAL HIGH (ref 70–99)
Glucose-Capillary: 145 mg/dL — ABNORMAL HIGH (ref 70–99)
Glucose-Capillary: 146 mg/dL — ABNORMAL HIGH (ref 70–99)
Glucose-Capillary: 153 mg/dL — ABNORMAL HIGH (ref 70–99)
Glucose-Capillary: 160 mg/dL — ABNORMAL HIGH (ref 70–99)
Glucose-Capillary: 173 mg/dL — ABNORMAL HIGH (ref 70–99)
Glucose-Capillary: 180 mg/dL — ABNORMAL HIGH (ref 70–99)
Glucose-Capillary: 258 mg/dL — ABNORMAL HIGH (ref 70–99)
Glucose-Capillary: 75 mg/dL (ref 70–99)
Glucose-Capillary: 81 mg/dL (ref 70–99)
Glucose-Capillary: 86 mg/dL (ref 70–99)

## 2019-09-10 LAB — CSF CELL COUNT WITH DIFFERENTIAL
Eosinophils, CSF: 0 % (ref 0–1)
Eosinophils, CSF: 0 % (ref 0–1)
Lymphs, CSF: 16 % — ABNORMAL LOW (ref 40–80)
Lymphs, CSF: 4 % — ABNORMAL LOW (ref 40–80)
Monocyte-Macrophage-Spinal Fluid: 4 % — ABNORMAL LOW (ref 15–45)
Monocyte-Macrophage-Spinal Fluid: 4 % — ABNORMAL LOW (ref 15–45)
RBC Count, CSF: 115 /mm3 — ABNORMAL HIGH
RBC Count, CSF: 13 /mm3 — ABNORMAL HIGH
Segmented Neutrophils-CSF: 80 % — ABNORMAL HIGH (ref 0–6)
Segmented Neutrophils-CSF: 92 % — ABNORMAL HIGH (ref 0–6)
Tube #: 1
Tube #: 4
WBC, CSF: 11 /mm3 (ref 0–5)
WBC, CSF: 12 /mm3 (ref 0–5)

## 2019-09-10 LAB — COMPREHENSIVE METABOLIC PANEL
ALT: 47 U/L — ABNORMAL HIGH (ref 0–44)
AST: 62 U/L — ABNORMAL HIGH (ref 15–41)
Albumin: 3.4 g/dL — ABNORMAL LOW (ref 3.5–5.0)
Alkaline Phosphatase: 56 U/L (ref 38–126)
Anion gap: 13 (ref 5–15)
BUN: 32 mg/dL — ABNORMAL HIGH (ref 8–23)
CO2: 20 mmol/L — ABNORMAL LOW (ref 22–32)
Calcium: 9 mg/dL (ref 8.9–10.3)
Chloride: 111 mmol/L (ref 98–111)
Creatinine, Ser: 2.08 mg/dL — ABNORMAL HIGH (ref 0.44–1.00)
GFR calc Af Amer: 28 mL/min — ABNORMAL LOW (ref >60)
GFR calc non Af Amer: 24 mL/min — ABNORMAL LOW (ref >60)
Glucose, Bld: 96 mg/dL (ref 70–99)
Potassium: 4.1 mmol/L (ref 3.5–5.1)
Sodium: 144 mmol/L (ref 135–145)
Total Bilirubin: 0.7 mg/dL (ref 0.3–1.2)
Total Protein: 6.3 g/dL — ABNORMAL LOW (ref 6.5–8.1)

## 2019-09-10 LAB — BASIC METABOLIC PANEL
Anion gap: 14 (ref 5–15)
Anion gap: 8 (ref 5–15)
BUN: 31 mg/dL — ABNORMAL HIGH (ref 8–23)
BUN: 36 mg/dL — ABNORMAL HIGH (ref 8–23)
CO2: 19 mmol/L — ABNORMAL LOW (ref 22–32)
CO2: 21 mmol/L — ABNORMAL LOW (ref 22–32)
Calcium: 9 mg/dL (ref 8.9–10.3)
Calcium: 8.7 mg/dL — ABNORMAL LOW (ref 8.9–10.3)
Chloride: 110 mmol/L (ref 98–111)
Chloride: 112 mmol/L — ABNORMAL HIGH (ref 98–111)
Creatinine, Ser: 1.8 mg/dL — ABNORMAL HIGH (ref 0.44–1.00)
Creatinine, Ser: 2.03 mg/dL — ABNORMAL HIGH (ref 0.44–1.00)
GFR calc Af Amer: 33 mL/min — ABNORMAL LOW (ref >60)
GFR calc Af Amer: 29 mL/min — ABNORMAL LOW (ref >60)
GFR calc non Af Amer: 29 mL/min — ABNORMAL LOW (ref >60)
GFR calc non Af Amer: 25 mL/min — ABNORMAL LOW (ref >60)
Glucose, Bld: 337 mg/dL — ABNORMAL HIGH (ref 70–99)
Glucose, Bld: 170 mg/dL — ABNORMAL HIGH (ref 70–99)
Potassium: 4.4 mmol/L (ref 3.5–5.1)
Potassium: 4.1 mmol/L (ref 3.5–5.1)
Sodium: 143 mmol/L (ref 135–145)
Sodium: 141 mmol/L (ref 135–145)

## 2019-09-10 LAB — LIPID PANEL
Cholesterol: 127 mg/dL (ref 0–200)
HDL: 54 mg/dL (ref >40)
LDL Cholesterol: 54 mg/dL (ref 0–99)
Total CHOL/HDL Ratio: 2.4 RATIO
Triglycerides: 96 mg/dL (ref <150)
VLDL: 19 mg/dL (ref 0–40)

## 2019-09-10 LAB — CBC
HCT: 31.7 % — ABNORMAL LOW (ref 36.0–46.0)
Hemoglobin: 11 g/dL — ABNORMAL LOW (ref 12.0–15.0)
MCH: 31.3 pg (ref 26.0–34.0)
MCHC: 34.7 g/dL (ref 30.0–36.0)
MCV: 90.3 fL (ref 80.0–100.0)
Platelets: 291 10*3/uL (ref 150–400)
RBC: 3.51 MIL/uL — ABNORMAL LOW (ref 3.87–5.11)
RDW: 11.9 % (ref 11.5–15.5)
WBC: 14.7 10*3/uL — ABNORMAL HIGH (ref 4.0–10.5)
nRBC: 0 % (ref 0.0–0.2)

## 2019-09-10 LAB — BETA-HYDROXYBUTYRIC ACID: Beta-Hydroxybutyric Acid: 0.52 mmol/L — ABNORMAL HIGH (ref 0.05–0.27)

## 2019-09-10 LAB — POCT I-STAT EG7
Acid-base deficit: 6 mmol/L — ABNORMAL HIGH (ref 0.0–2.0)
Bicarbonate: 17.6 mmol/L — ABNORMAL LOW (ref 20.0–28.0)
Calcium, Ion: 1.1 mmol/L — ABNORMAL LOW (ref 1.15–1.40)
HCT: 33 % — ABNORMAL LOW (ref 36.0–46.0)
Hemoglobin: 11.2 g/dL — ABNORMAL LOW (ref 12.0–15.0)
O2 Saturation: 87 %
Potassium: 4.3 mmol/L (ref 3.5–5.1)
Sodium: 143 mmol/L (ref 135–145)
TCO2: 18 mmol/L — ABNORMAL LOW (ref 22–32)
pCO2, Ven: 28.7 mmHg — ABNORMAL LOW (ref 44.0–60.0)
pH, Ven: 7.395 (ref 7.250–7.430)
pO2, Ven: 52 mmHg — ABNORMAL HIGH (ref 32.0–45.0)

## 2019-09-10 LAB — VANCOMYCIN, RANDOM: Vancomycin Rm: 10

## 2019-09-10 LAB — URINE CULTURE: Culture: NO GROWTH

## 2019-09-10 LAB — LACTIC ACID, PLASMA
Lactic Acid, Venous: 6 mmol/L (ref 0.5–1.9)
Lactic Acid, Venous: 3.1 mmol/L (ref 0.5–1.9)
Lactic Acid, Venous: 1.9 mmol/L (ref 0.5–1.9)

## 2019-09-10 LAB — HIV ANTIBODY (ROUTINE TESTING W REFLEX): HIV Screen 4th Generation wRfx: NONREACTIVE

## 2019-09-10 LAB — PROCALCITONIN: Procalcitonin: 3.98 ng/mL

## 2019-09-10 LAB — AMMONIA: Ammonia: 22 umol/L (ref 9–35)

## 2019-09-10 LAB — MRSA PCR SCREENING: MRSA by PCR: NEGATIVE

## 2019-09-10 LAB — PROTEIN AND GLUCOSE, CSF
Glucose, CSF: 283 mg/dL — ABNORMAL HIGH (ref 40–70)
Total  Protein, CSF: 104 mg/dL — ABNORMAL HIGH (ref 15–45)

## 2019-09-10 LAB — ETHANOL: Alcohol, Ethyl (B): <10 mg/dL (ref <10)

## 2019-09-10 LAB — MAGNESIUM
Magnesium: 1.5 mg/dL — ABNORMAL LOW (ref 1.7–2.4)
Magnesium: 2.2 mg/dL (ref 1.7–2.4)

## 2019-09-10 LAB — PHOSPHORUS: Phosphorus: 2.9 mg/dL (ref 2.5–4.6)

## 2019-09-10 LAB — CK: Total CK: 534 U/L — ABNORMAL HIGH (ref 38–234)

## 2019-09-10 LAB — T4, FREE: Free T4: 1.19 ng/dL — ABNORMAL HIGH (ref 0.61–1.12)

## 2019-09-10 MED ORDER — MIDAZOLAM HCL 2 MG/2ML IJ SOLN
2.0000 mg | Freq: Once | INTRAMUSCULAR | Status: AC
  Administered 2019-09-10: 03:00:00 2 mg via INTRAVENOUS

## 2019-09-10 MED ORDER — VITAL AF 1.2 CAL PO LIQD
1000.0000 mL | ORAL | Status: DC
  Administered 2019-09-10 – 2019-09-11 (×2): 1000 mL

## 2019-09-10 MED ORDER — CHLORHEXIDINE GLUCONATE CLOTH 2 % EX PADS
6.0000 | MEDICATED_PAD | Freq: Every day | CUTANEOUS | Status: DC
  Administered 2019-09-10 – 2019-09-13 (×4): 6 via TOPICAL

## 2019-09-10 MED ORDER — DEXTROSE 5 % IV SOLN
500.0000 mg | Freq: Once | INTRAVENOUS | Status: AC
  Administered 2019-09-10: 500 mg via INTRAVENOUS
  Filled 2019-09-10: qty 10

## 2019-09-10 MED ORDER — SODIUM CHLORIDE 0.9 % IV SOLN
INTRAVENOUS | Status: DC
  Administered 2019-09-10 – 2019-09-12 (×5): via INTRAVENOUS

## 2019-09-10 MED ORDER — INSULIN DETEMIR 100 UNIT/ML ~~LOC~~ SOLN
10.0000 [IU] | Freq: Two times a day (BID) | SUBCUTANEOUS | Status: DC
  Administered 2019-09-10 (×2): 10 [IU] via SUBCUTANEOUS
  Filled 2019-09-10 (×4): qty 0.1

## 2019-09-10 MED ORDER — LABETALOL HCL 5 MG/ML IV SOLN
20.0000 mg | INTRAVENOUS | Status: DC | PRN
  Administered 2019-09-10: 20 mg via INTRAVENOUS
  Filled 2019-09-10 (×2): qty 4

## 2019-09-10 MED ORDER — INSULIN ASPART 100 UNIT/ML ~~LOC~~ SOLN
1.0000 [IU] | SUBCUTANEOUS | Status: DC
  Administered 2019-09-10: 2 [IU] via SUBCUTANEOUS
  Administered 2019-09-10: 1 [IU] via SUBCUTANEOUS
  Administered 2019-09-11: 3 [IU] via SUBCUTANEOUS

## 2019-09-10 MED ORDER — SODIUM CHLORIDE 0.9 % IV SOLN
2.0000 g | INTRAVENOUS | Status: AC
  Administered 2019-09-10 – 2019-09-14 (×5): 2 g via INTRAVENOUS
  Filled 2019-09-10 (×5): qty 2

## 2019-09-10 MED ORDER — INSULIN ASPART 100 UNIT/ML ~~LOC~~ SOLN: 0.0000 [IU] | Freq: Three times a day (TID) | SUBCUTANEOUS | Status: DC

## 2019-09-10 MED ORDER — MIDAZOLAM HCL 2 MG/2ML IJ SOLN
INTRAMUSCULAR | Status: AC
  Administered 2019-09-10: 2 mg via INTRAVENOUS
  Filled 2019-09-10: qty 2

## 2019-09-10 MED ORDER — LACTATED RINGERS IV SOLN
INTRAVENOUS | Status: DC
  Administered 2019-09-10: 100 mL/h via INTRAVENOUS

## 2019-09-10 MED ORDER — VANCOMYCIN HCL IN DEXTROSE 1-5 GM/200ML-% IV SOLN
1000.0000 mg | INTRAVENOUS | Status: DC
  Administered 2019-09-10: 1000 mg via INTRAVENOUS
  Filled 2019-09-10 (×2): qty 200

## 2019-09-10 MED ORDER — ACETAMINOPHEN 10 MG/ML IV SOLN
1000.0000 mg | Freq: Once | INTRAVENOUS | Status: AC
  Administered 2019-09-10: 1000 mg via INTRAVENOUS
  Filled 2019-09-10 (×2): qty 100

## 2019-09-10 MED ORDER — DEXTROSE 5 % IV SOLN
500.0000 mg | INTRAVENOUS | Status: DC
  Administered 2019-09-11 – 2019-09-12 (×2): 500 mg via INTRAVENOUS
  Filled 2019-09-10 (×3): qty 10

## 2019-09-10 MED ORDER — CLEVIDIPINE BUTYRATE 0.5 MG/ML IV EMUL
0.0000 mg/h | INTRAVENOUS | Status: DC
  Filled 2019-09-10: qty 50

## 2019-09-10 MED ORDER — SODIUM CHLORIDE 0.9 % IV SOLN
INTRAVENOUS | Status: DC | PRN
  Administered 2019-09-10: 250 mL via INTRAVENOUS

## 2019-09-10 MED ORDER — MAGNESIUM SULFATE 2 GM/50ML IV SOLN
2.0000 g | Freq: Once | INTRAVENOUS | Status: AC
  Administered 2019-09-10: 2 g via INTRAVENOUS
  Filled 2019-09-10: qty 50

## 2019-09-10 NOTE — Progress Notes (Signed)
Pharmacy Antibiotic Note  Amanda [name] is a 68 y.o. female admitted on 09/09/2019 with sepsis.  Pharmacy has been consulted for vancomycin and cefepime dosing.  Also suspecting herpes encephalitis.  Patient is s/p LP on 09/10/19.  Patient with AKI and SCr stabilizing - SCr 2.03, CrCL 23 ml/min, Tmax 102.3, WBC 14.7, LA up 3.1, PCT 4.  Plan: Vanc 1gm IV Q48H for AUC 521 using SCr 2.03 Cefepime 2gm IV Q24H Acyclovir 500mg  IV Q24H  Monitor renal fxn, clinical progress, vanc AUC as indicated  Height: 5\' 4"  (162.6 cm) Weight: 124 lb 1.9 oz (56.3 kg) IBW/kg (Calculated) : 54.7  Temp (24hrs), Avg:103.5 F (39.7 C), Min:101.5 F (38.6 C), Max:105.1 F (40.6 C)  Recent Labs  Lab 09/09/19 1636 09/09/19 1930 09/09/19 2110 09/10/19 0032 09/10/19 0206 09/10/19 0404 09/10/19 0615 09/10/19 0835 09/10/19 1154  WBC 14.4*  --   --   --   --  14.7*  --   --   --   CREATININE 1.87* 1.74* 1.82* 1.80*  --  2.08*  --  2.03*  --   LATICACIDVEN 2.6* 3.0*  --   --  6.0*  --  3.1*  --  1.9  VANCORANDOM  --   --   --   --   --   --   --   --  10    Estimated Creatinine Clearance: 23.2 mL/min (A) (by C-G formula based on SCr of 2.03 mg/dL (H)).    Allergies  Allergen Reactions  . Gabapentin Other (See Comments)    Pt states medication made her feel "out of it".   Pt states medication made her feel "out of it".      Vanc 9/21 >> Cefepime 9/21 >> Acyclovir 9/22 >>  Vanc 1g 9/21 at 1730 >> VR 9/22 at 12N = 10 mcg/mL.  Schedule vanc.  9/21 covid - negative 9/21 UCx -  9/21 BCx -  9/21 RVP PCR -  9/21 C.diff PCR -  9/22 MRSA PCR - negative 9/22 CSF cx -   Amanda [name] D. Mina Marble, [name], BCPS, [place] 09/10/2019, 12:50 PM

## 2019-09-10 NOTE — Progress Notes (Signed)
Patient MRI results back.  Paged attending Neuro Dr. Leonel Ramsay.  Per MD okay to restart Propofol.  Additional orders received.

## 2019-09-10 NOTE — Progress Notes (Signed)
Pharmacy Anti-infective Note  Amanda [name] is a 68 y.o. 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  Pharmacy has been consulted for acyclovir dosing.  Plan: Acyclovir 500mg  IV Q24H.  Height: 5\' 4"  (162.6 cm) Weight: 124 lb 1.9 oz (56.3 kg) IBW/kg (Calculated) : 54.7  Temp (24hrs), Avg:103.8 F (39.9 C), Min:101.6 F (38.7 C), Max:105.1 F (40.6 C)  Recent Labs  Lab 09/09/19 1636 09/09/19 1930 09/09/19 2110 09/10/19 0032 09/10/19 0206 09/10/19 0404  WBC 14.4*  --   --   --   --  14.7*  CREATININE 1.87* 1.74* 1.82* 1.80*  --  2.08*  LATICACIDVEN 2.6* 3.0*  --   --  6.0*  --     Estimated Creatinine Clearance: 22.7 mL/min (A) (by C-G formula based on SCr of 2.08 mg/dL (H)).    Allergies  Allergen Reactions  . Gabapentin Other (See Comments)    Pt states medication made her feel "out of it".   Pt states medication made her feel "out of it".       Thank you for allowing pharmacy to be a part of this patient's care.  Wynona Neat, [name], BCPS  09/10/2019 6:39 AM

## 2019-09-10 NOTE — ED Notes (Signed)
This RN attempted to get pt to CT STAT Also 2nd RN Velna Hatchet) walked over to CT to advocate for immediate CT scan.  Pt not following commands and decerebrate posturing.

## 2019-09-10 NOTE — Progress Notes (Signed)
CRITICAL VALUE ALERT  Critical Value: CSF - WBC 12 and 11  Date & Time Notied: 09/10/2019 @ 0335  Provider Notified: [place] Sayers, RN  Orders Received/Actions taken: See orders

## 2019-09-10 NOTE — Progress Notes (Signed)
Transported to MRI and back without complications

## 2019-09-10 NOTE — Progress Notes (Signed)
[facility] Progress Note Patient Name: Amanda [name] DOB: 03/21/1951 MRN: [id number]   Date of Service  09/10/2019  HPI/Events of Note  Agitation/Posturing - Request for Versed to facilitate MRI Scan.   eICU Interventions  Will order: 1. Versed 2 mg IV prior to MRI.      Intervention Category Major Interventions: Delirium, psychosis, severe agitation - evaluation and management  Sommer,Steven Eugene 09/10/2019, 2:34 AM

## 2019-09-10 NOTE — Progress Notes (Signed)
Initial Nutrition Assessment  DOCUMENTATION CODES:   Not applicable  INTERVENTION:   Tube Feeding:  Vital AF at 1.2 at 65 ml/hr Provides 117 g of protein, 1872 kcals and 1264 mL of free water Meets 100% estimated calorie and protein neeeds   NUTRITION DIAGNOSIS:   Inadequate oral intake related to acute illness as evidenced by NPO status.  GOAL:   Patient will meet greater than or equal to 90% of their needs  MONITOR:   Vent status, TF tolerance, Labs, Weight trends, Skin  REASON FOR ASSESSMENT:   Consult Assessment of nutrition requirement/status, Enteral/tube feeding initiation and management  ASSESSMENT:   68 yo female AMS with suspected PRES, HTN emergency, acute respiratory failure requiring intubation, AKI with rhabdomyolysis.PMH includes HTN, DM, normocytic anemia, dyslipidemia, MDD  Patient is currently intubated on ventilator support MV: 11.1 L/min Temp (24hrs), Avg:103.5 F (39.7 C), Min:101.5 F (38.6 C), Max:105.1 F (40.6 C)  Propofol: OFF  Unable to obtain diet and weight history at this time  No significant weight loss per weight encounters  Labs: Creatinine 2.03, BUN 36 Meds: insulin drip   NUTRITION - FOCUSED PHYSICAL EXAM:  Unable to perform, on droplet precautions  Diet Order:   Diet Order            Diet NPO time specified  Diet effective now              EDUCATION NEEDS:   Education needs have been addressed  Skin:  Skin Assessment: Reviewed RN Assessment  Last BM:  9/22  Height:   Ht Readings from Last 1 Encounters:  09/09/19 5\' 4"  (1.626 m)    Weight:   Wt Readings from Last 1 Encounters:  09/10/19 56.3 kg    Ideal Body Weight:  54.5 kg  BMI:  Body mass index is 21.3 kg/m.  Estimated Nutritional Needs:   Kcal:  1957 kcals  Protein:  112-140 g  Fluid:  >/= 1.9 L  Cate Nyshaun Standage MS, RDN, LDN, CNSC [phone] Pager  [phone] Weekend/On-Call Pager

## 2019-09-10 NOTE — Progress Notes (Signed)
Subjective: LTM is being started.   Objective: Current vital signs: BP (!) 195/64   Pulse (!) 106   Temp (!) 102.3 F (39.1 C)   Resp 20   Ht 5' 4" (1.626 m)   Wt 56.3 kg   SpO2 100%   BMI 21.30 kg/m  Vital signs in last 24 hours: Temp:  [101.6 F (38.7 C)-105.1 F (40.6 C)] 102.3 F (39.1 C) (09/22 0757) Pulse Rate:  [102-139] 106 (09/22 0800) Resp:  [14-31] 20 (09/22 0800) BP: (90-207)/(45-155) 195/64 (09/22 0800) SpO2:  [97 %-100 %] 100 % (09/22 0800) FiO2 (%):  [40 %-100 %] 40 % (09/22 0800) Weight:  [56.3 kg-61.3 kg] 56.3 kg (09/22 0408)  Intake/Output from previous day: 09/21 0701 - 09/22 0700 In: 2869.7 [I.V.:869.8; IV Piggyback:2000] Out: 400 [Urine:400] Intake/Output this shift: No intake/output data recorded. Nutritional status:  Diet Order            Diet NPO time specified  Diet effective now             HEENT: [place]/AT Ext: No edema  Neurologic Exam: Ment: No responses to external stimuli. CN: Right pupil irregular and 2 mm, sluggishly reactive. Left pupil 2 mm round and sluggishly reactive. No blink to threat. No oculocephalic reflex. No corneal reflex.  Motor/Sensory: Flaccid tone to RUE and RLE. Decreased tone to LUE and LLE.  No movement to noxious stimuli Reflexes: Hypoactive throughout. Toes mute bilaterally.  Cerebellar/Gait: Unable to assess   Lab Results: Results for orders placed or performed during the hospital encounter of 09/09/19 (from the past 48 hour(s))  CBG monitoring, ED     Status: Abnormal   Collection Time: 09/09/19  4:22 PM  Result Value Ref Range   Glucose-Capillary 483 (H) 70 - 99 mg/dL  Lactic acid, plasma     Status: Abnormal   Collection Time: 09/09/19  4:36 PM  Result Value Ref Range   Lactic Acid, Venous 2.6 (HH) 0.5 - 1.9 mmol/L    Comment: CRITICAL RESULT CALLED TO, READ BACK BY AND VERIFIED WITHWynelle Fanny 1753 09/09/2019 D BRADLEY Performed at [facility], [place] [address] [place], [place] [address]    Comprehensive metabolic panel     Status: Abnormal   Collection Time: 09/09/19  4:36 PM  Result Value Ref Range   Sodium 139 135 - 145 mmol/L   Potassium 4.6 3.5 - 5.1 mmol/L   Chloride 101 98 - 111 mmol/L   CO2 21 (L) 22 - 32 mmol/L   Glucose, Bld 522 (HH) 70 - 99 mg/dL    Comment: CRITICAL RESULT CALLED TO, READ BACK BY AND VERIFIED WITH: C MARSHALL,RN 1803 09/09/2019 D BRADLEY    BUN 36 (H) 8 - 23 mg/dL   Creatinine, Ser 1.87 (H) 0.44 - 1.00 mg/dL   Calcium 10.0 8.9 - 10.3 mg/dL   Total Protein 7.8 6.5 - 8.1 g/dL   Albumin 4.5 3.5 - 5.0 g/dL   AST 45 (H) 15 - 41 U/L   ALT 51 (H) 0 - 44 U/L   Alkaline Phosphatase 79 38 - 126 U/L   Total Bilirubin 1.2 0.3 - 1.2 mg/dL   GFR calc non Af Amer 27 (L) >60 mL/min   GFR calc Af Amer 32 (L) >60 mL/min   Anion gap 17 (H) 5 - 15    Comment: Performed at [facility], [place] [address] [place], [place] [address]  CBC WITH DIFFERENTIAL     Status: Abnormal  Collection Time: 09/09/19  4:36 PM  Result Value Ref Range   WBC 14.4 (H) 4.0 - 10.5 K/uL   RBC 4.02 3.87 - 5.11 MIL/uL   Hemoglobin 12.2 12.0 - 15.0 g/dL   HCT 36.4 36.0 - 46.0 %   MCV 90.5 80.0 - 100.0 fL   MCH 30.3 26.0 - 34.0 pg   MCHC 33.5 30.0 - 36.0 g/dL   RDW 11.6 11.5 - 15.5 %   Platelets 360 150 - 400 K/uL   nRBC 0.0 0.0 - 0.2 %   Neutrophils Relative % 91 %   Neutro Abs 13.1 (H) 1.7 - 7.7 K/uL   Lymphocytes Relative 4 %   Lymphs Abs 0.5 (L) 0.7 - 4.0 K/uL   Monocytes Relative 5 %   Monocytes Absolute 0.8 0.1 - 1.0 K/uL   Eosinophils Relative 0 %   Eosinophils Absolute 0.0 0.0 - 0.5 K/uL   Basophils Relative 0 %   Basophils Absolute 0.0 0.0 - 0.1 K/uL   Immature Granulocytes 0 %   Abs Immature Granulocytes 0.05 0.00 - 0.07 K/uL    Comment: Performed at [facility], [address], [place], [place] [address]  APTT     Status: None   Collection Time: 09/09/19  4:36 PM  Result Value Ref Range   aPTT 26 24 - 36 seconds    Comment: Performed at [place] [address] [place], [place] [address]  Protime-INR     Status: None   Collection Time: 09/09/19  4:36 PM  Result Value Ref Range   Prothrombin Time 14.5 11.4 - 15.2 seconds   INR 1.1 0.8 - 1.2    Comment: (NOTE) INR goal varies based on device and disease states. Performed at [facility], [place] [address], [place], [place] [address]   Urinalysis, Routine w reflex microscopic     Status: Abnormal   Collection Time: 09/09/19  4:36 PM  Result Value Ref Range   Color, Urine YELLOW YELLOW   APPearance HAZY (A) CLEAR   Specific Gravity, Urine 1.023 1.005 - 1.030   pH 5.0 5.0 - 8.0   Glucose, UA >=500 (A) NEGATIVE mg/dL   Hgb urine dipstick SMALL (A) NEGATIVE   Bilirubin Urine NEGATIVE NEGATIVE   Ketones, ur 5 (A) NEGATIVE mg/dL   Protein, ur >=300 (A) NEGATIVE mg/dL   Nitrite NEGATIVE NEGATIVE   Leukocytes,Ua NEGATIVE NEGATIVE   RBC / HPF 0-5 0 - 5 RBC/hpf   WBC, UA 0-5 0 - 5 WBC/hpf   Bacteria, UA NONE SEEN NONE SEEN   Mucus PRESENT     Comment: Performed at [place] [address], [place], [place] [address]  CK     Status: None   Collection Time: 09/09/19  4:36 PM  Result Value Ref Range   Total CK 125 38 - 234 U/L    Comment: Performed at [facility], [place] [address], [place], [place] [address]  SARS Coronavirus 2 [facility] order, Performed in [facility] hospital lab) Nasopharyngeal Urine, Catheterized     Status: None   Collection Time: 09/09/19  4:52 PM   Specimen: Urine, Catheterized; Nasopharyngeal  Result Value Ref Range   SARS Coronavirus 2 NEGATIVE NEGATIVE    Comment: (NOTE) If result is NEGATIVE SARS-CoV-2 target nucleic acids are NOT DETECTED. The SARS-CoV-2 RNA is generally detectable in upper and lower  respiratory specimens during the acute phase of infection. The lowest  concentration of SARS-CoV-2  viral copies this assay can detect is 250  copies / mL. A negative result does not preclude SARS-CoV-2 infection  and  should not be used as the sole basis for treatment or other  patient management decisions.  A negative result may occur with  improper specimen collection / handling, submission of specimen other  than nasopharyngeal swab, presence of viral mutation(s) within the  areas targeted by this assay, and inadequate number of viral copies  (<250 copies / mL). A negative result must be combined with clinical  observations, patient history, and epidemiological information. If result is POSITIVE SARS-CoV-2 target nucleic acids are DETECTED. The SARS-CoV-2 RNA is generally detectable in upper and lower  respiratory specimens dur ing the acute phase of infection.  Positive  results are indicative of active infection with SARS-CoV-2.  Clinical  correlation with patient history and other diagnostic information is  necessary to determine patient infection status.  Positive results do  not rule out bacterial infection or co-infection with other viruses. If result is PRESUMPTIVE POSTIVE SARS-CoV-2 nucleic acids MAY BE PRESENT.   A presumptive positive result was obtained on the submitted specimen  and confirmed on repeat testing.  While 2019 novel coronavirus  (SARS-CoV-2) nucleic acids may be present in the submitted sample  additional confirmatory testing may be necessary for epidemiological  and / or clinical management purposes  to differentiate between  SARS-CoV-2 and other Sarbecovirus currently known to infect humans.  If clinically indicated additional testing with an alternate test  methodology [phone]) is advised. The SARS-CoV-2 RNA is generally  detectable in upper and lower respiratory sp ecimens during the acute  phase of infection. The expected result is Negative. Fact Sheet for Patients:  StrictlyIdeas.no Fact Sheet for Healthcare Providers: BankingDealers.co.za This test is not yet approved or cleared by the [place] FDA and has been  authorized for detection and/or diagnosis of SARS-CoV-2 by FDA under an Emergency Use Authorization (EUA).  This EUA will remain in effect (meaning this test can be used) for the duration of the COVID-19 declaration under Section 564(b)(1) of the Act, 21 U.S.C. section 360bbb-3(b)(1), unless the authorization is terminated or revoked sooner. Performed at [facility], [place] [address] [place], [place] [address]   POCT I-Stat EG7     Status: Abnormal   Collection Time: 09/09/19  5:20 PM  Result Value Ref Range   pH, Ven 7.345 7.250 - 7.430   pCO2, Ven 40.2 (L) 44.0 - 60.0 mmHg   pO2, Ven 66.0 (H) 32.0 - 45.0 mmHg   Bicarbonate 22.0 20.0 - 28.0 mmol/L   TCO2 23 22 - 32 mmol/L   O2 Saturation 91.0 %   Acid-base deficit 4.0 (H) 0.0 - 2.0 mmol/L   Sodium 139 135 - 145 mmol/L   Potassium 4.5 3.5 - 5.1 mmol/L   Calcium, Ion 1.20 1.15 - 1.40 mmol/L   HCT 37.0 36.0 - 46.0 %   Hemoglobin 12.6 12.0 - 15.0 g/dL   Patient temperature HIDE    Sample type VENOUS   Lactic acid, plasma     Status: Abnormal   Collection Time: 09/09/19  7:30 PM  Result Value Ref Range   Lactic Acid, Venous 3.0 (HH) 0.5 - 1.9 mmol/L    Comment: CRITICAL VALUE NOTED.  VALUE IS CONSISTENT WITH PREVIOUSLY REPORTED AND CALLED VALUE. Performed at [facility], [place] [address] [place], [place] [address]   Basic metabolic panel     Status: Abnormal   Collection Time:  09/09/19  7:30 PM  Result Value Ref Range   Sodium 138 135 - 145 mmol/L   Potassium 4.7 3.5 - 5.1 mmol/L   Chloride 103 98 - 111 mmol/L   CO2 19 (L) 22 - 32 mmol/L   Glucose, Bld 532 (HH) 70 - 99 mg/dL    Comment: CRITICAL RESULT CALLED TO, READ BACK BY AND VERIFIED WITH: J FERRINOLO,RN 2005 09/09/2019 WBON    BUN 36 (H) 8 - 23 mg/dL   Creatinine, Ser 1.74 (H) 0.44 - 1.00 mg/dL   Calcium 9.5 8.9 - 10.3 mg/dL   GFR calc non Af Amer 30 (L) >60 mL/min   GFR calc Af Amer 35 (L) >60 mL/min   Anion gap 16 (H) 5 - 15    Comment: Performed at  [place] [address] [place], [place] [address]  CBG monitoring, ED     Status: Abnormal   Collection Time: 09/09/19  8:36 PM  Result Value Ref Range   Glucose-Capillary 483 (H) 70 - 99 mg/dL  Basic metabolic panel     Status: Abnormal   Collection Time: 09/09/19  9:10 PM  Result Value Ref Range   Sodium 139 135 - 145 mmol/L   Potassium 5.1 3.5 - 5.1 mmol/L   Chloride 105 98 - 111 mmol/L   CO2 18 (L) 22 - 32 mmol/L   Glucose, Bld 491 (H) 70 - 99 mg/dL   BUN 33 (H) 8 - 23 mg/dL   Creatinine, Ser 1.82 (H) 0.44 - 1.00 mg/dL   Calcium 9.1 8.9 - 10.3 mg/dL   GFR calc non Af Amer 28 (L) >60 mL/min   GFR calc Af Amer 33 (L) >60 mL/min   Anion gap 16 (H) 5 - 15    Comment: Performed at [facility], [place] [address], [place], [place] [address]  Hemoglobin A1c     Status: Abnormal   Collection Time: 09/09/19  9:10 PM  Result Value Ref Range   Hgb A1c MFr Bld 10.9 (H) 4.8 - 5.6 %    Comment: (NOTE) Pre diabetes:          5.7%-6.4% Diabetes:              >6.4% Glycemic control for   <7.0% adults with diabetes    Mean Plasma Glucose 266.13 mg/dL    Comment: Performed at [place] [address] [place], [place] [address]  TSH     Status: None   Collection Time: 09/09/19  9:10 PM  Result Value Ref Range   TSH 0.552 0.350 - 4.500 uIU/mL    Comment: Performed by a 3rd Generation assay with a functional sensitivity of <=0.01 uIU/mL. Performed at [facility], [place] [address] , [place] [address]   POCT I-Stat EG7     Status: Abnormal   Collection Time: 09/09/19  9:25 PM  Result Value Ref Range   pH, Ven 7.431 (H) 7.250 - 7.430   pCO2, Ven 26.1 (L) 44.0 - 60.0 mmHg   pO2, Ven 141.0 (H) 32.0 - 45.0 mmHg   Bicarbonate 17.4 (L) 20.0 - 28.0 mmol/L   TCO2 18 (L) 22 - 32 mmol/L   O2 Saturation 99.0 %   Acid-base deficit 6.0 (H) 0.0 - 2.0 mmol/L   Sodium 138 135 - 145 mmol/L   Potassium 4.7 3.5 - 5.1 mmol/L   Calcium, Ion 1.10 (L) 1.15 - 1.40  mmol/L   HCT 33.0 (L) 36.0 - 46.0 %  Hemoglobin 11.2 (L) 12.0 - 15.0 g/dL   Patient temperature HIDE    Sample type VENOUS   CBG monitoring, ED     Status: Abnormal   Collection Time: 09/09/19  9:51 PM  Result Value Ref Range   Glucose-Capillary 386 (H) 70 - 99 mg/dL  CBG monitoring, ED     Status: Abnormal   Collection Time: 09/09/19 10:59 PM  Result Value Ref Range   Glucose-Capillary 332 (H) 70 - 99 mg/dL  CBG monitoring, ED     Status: Abnormal   Collection Time: 09/09/19 11:42 PM  Result Value Ref Range   Glucose-Capillary 313 (H) 70 - 99 mg/dL  I-STAT 7, (LYTES, BLD GAS, ICA, H+H)     Status: Abnormal   Collection Time: 09/09/19 11:57 PM  Result Value Ref Range   pH, Arterial 7.386 7.350 - 7.450   pCO2 arterial 35.6 32.0 - 48.0 mmHg   pO2, Arterial 370.0 (H) 83.0 - 108.0 mmHg   Bicarbonate 20.6 20.0 - 28.0 mmol/L   TCO2 22 22 - 32 mmol/L   O2 Saturation 100.0 %   Acid-base deficit 3.0 (H) 0.0 - 2.0 mmol/L   Sodium 143 135 - 145 mmol/L   Potassium 4.4 3.5 - 5.1 mmol/L   Calcium, Ion 1.22 1.15 - 1.40 mmol/L   HCT 31.0 (L) 36.0 - 46.0 %   Hemoglobin 10.5 (L) 12.0 - 15.0 g/dL   Patient temperature 105.1 F    Collection site RADIAL, ALLEN'S TEST ACCEPTABLE    Drawn by RT    Sample type ARTERIAL   CSF cell count with differential collection tube #: 1     Status: Abnormal   Collection Time: 09/10/19 12:02 AM  Result Value Ref Range   Tube # 1    Color, CSF COLORLESS COLORLESS   Appearance, CSF CLEAR (A) CLEAR   Supernatant NOT INDICATED    RBC Count, CSF 115 (H) 0 /cu mm   WBC, CSF 12 (HH) 0 - 5 /cu mm    Comment: CRITICAL RESULT CALLED TO, READ BACK BY AND VERIFIED WITH: RN K PHILLIPS @ [phone] 09/10/19 BY S GEZAHEGN    Segmented Neutrophils-CSF 92 (H) 0 - 6 %   Lymphs, CSF 4 (L) 40 - 80 %   Monocyte-Macrophage-Spinal Fluid 4 (L) 15 - 45 %   Eosinophils, CSF 0 0 - 1 %    Comment: Performed at [facility], [place] [address], [place], [place] [address]  CSF cell  count with differential collection tube #: 4     Status: Abnormal   Collection Time: 09/10/19 12:02 AM  Result Value Ref Range   Tube # 4    Color, CSF COLORLESS COLORLESS   Appearance, CSF CLEAR (A) CLEAR   Supernatant NOT INDICATED    RBC Count, CSF 13 (H) 0 /cu mm   WBC, CSF 11 (HH) 0 - 5 /cu mm    Comment: CRITICAL RESULT CALLED TO, READ BACK BY AND VERIFIED WITH: RN K PHILLIPS @ [phone] 09/10/19 BY S GEZAHEGN    Segmented Neutrophils-CSF 80 (H) 0 - 6 %   Lymphs, CSF 16 (L) 40 - 80 %   Monocyte-Macrophage-Spinal Fluid 4 (L) 15 - 45 %   Eosinophils, CSF 0 0 - 1 %    Comment: Performed at [facility], [place] [address] [place], [place] [address]  CSF culture     Status: None (Preliminary result)   Collection Time: 09/10/19 12:02 AM   Specimen: CSF;  Cerebrospinal Fluid  Result Value Ref Range   Specimen Description CSF    Special Requests NONE    Gram Stain      WBC PRESENT,BOTH PMN AND MONONUCLEAR NO ORGANISMS SEEN CYTOSPIN SMEAR Performed at [facility], [address] [place], [place] [address]    Culture PENDING    Report Status PENDING   Protein and glucose, CSF     Status: Abnormal   Collection Time: 09/10/19 12:02 AM  Result Value Ref Range   Glucose, CSF 283 (H) 40 - 70 mg/dL   Total  Protein, CSF 104 (H) 15 - 45 mg/dL    Comment: Performed at [facility] [address], [place], [place] [address]  Basic metabolic panel     Status: Abnormal   Collection Time: 09/10/19 12:32 AM  Result Value Ref Range   Sodium 143 135 - 145 mmol/L   Potassium 4.4 3.5 - 5.1 mmol/L   Chloride 110 98 - 111 mmol/L   CO2 19 (L) 22 - 32 mmol/L   Glucose, Bld 337 (H) 70 - 99 mg/dL   BUN 31 (H) 8 - 23 mg/dL   Creatinine, Ser 1.80 (H) 0.44 - 1.00 mg/dL   Calcium 9.0 8.9 - 10.3 mg/dL   GFR calc non Af Amer 29 (L) >60 mL/min   GFR calc Af Amer 33 (L) >60 mL/min   Anion gap 14 5 - 15    Comment: Performed at [facility], [place] [address], [place], [place] [address]   Magnesium     Status: Abnormal   Collection Time: 09/10/19 12:32 AM  Result Value Ref Range   Magnesium 1.5 (L) 1.7 - 2.4 mg/dL    Comment: Performed at [facility] [address] [place], [place] [address]  Procalcitonin     Status: None   Collection Time: 09/10/19 12:32 AM  Result Value Ref Range   Procalcitonin 3.98 ng/mL    Comment:        Interpretation: PCT > 2 ng/mL: Systemic infection (sepsis) is likely, unless other causes are known. (NOTE)       Sepsis PCT Algorithm           Lower Respiratory Tract                                      Infection PCT Algorithm    ----------------------------     ----------------------------         PCT < 0.25 ng/mL                PCT < 0.10 ng/mL         Strongly encourage             Strongly discourage   discontinuation of antibiotics    initiation of antibiotics    ----------------------------     -----------------------------       PCT 0.25 - 0.50 ng/mL            PCT 0.10 - 0.25 ng/mL               OR       >80% decrease in PCT            Discourage initiation of  antibiotics      Encourage discontinuation           of antibiotics    ----------------------------     -----------------------------         PCT >= 0.50 ng/mL              PCT 0.26 - 0.50 ng/mL               AND       <80% decrease in PCT              Encourage initiation of                                             antibiotics       Encourage continuation           of antibiotics    ----------------------------     -----------------------------        PCT >= 0.50 ng/mL                  PCT > 0.50 ng/mL               AND         increase in PCT                  Strongly encourage                                      initiation of antibiotics    Strongly encourage escalation           of antibiotics                                     -----------------------------                                           PCT <=  0.25 ng/mL                                                 OR                                        > 80% decrease in PCT                                     Discontinue / Do not initiate                                             antibiotics Performed at [facility], [place] [address] [place], [place] [address]   Ethanol     Status: None   Collection Time: 09/10/19 12:32  AM  Result Value Ref Range   Alcohol, Ethyl (B) <10 <10 mg/dL    Comment: (NOTE) Lowest detectable limit for serum alcohol is 10 mg/dL. For medical purposes only. Performed at [facility], [place] [address], [place], [place] [address]   Beta-hydroxybutyric acid     Status: Abnormal   Collection Time: 09/10/19 12:32 AM  Result Value Ref Range   Beta-Hydroxybutyric Acid 0.52 (H) 0.05 - 0.27 mmol/L    Comment: Performed at [facility] [address] [place], [place] [address]  T4, free     Status: Abnormal   Collection Time: 09/10/19 12:32 AM  Result Value Ref Range   Free T4 1.19 (H) 0.61 - 1.12 ng/dL    Comment: (NOTE) Biotin ingestion may interfere with free T4 tests. If the results are inconsistent with the TSH level, previous test results, or the clinical presentation, then consider biotin interference. If needed, order repeat testing after stopping biotin. Performed at [facility], [place] [address], [place], [place] [address]   POCT I-Stat EG7     Status: Abnormal   Collection Time: 09/10/19 12:41 AM  Result Value Ref Range   pH, Ven 7.395 7.250 - 7.430   pCO2, Ven 28.7 (L) 44.0 - 60.0 mmHg   pO2, Ven 52.0 (H) 32.0 - 45.0 mmHg   Bicarbonate 17.6 (L) 20.0 - 28.0 mmol/L   TCO2 18 (L) 22 - 32 mmol/L   O2 Saturation 87.0 %   Acid-base deficit 6.0 (H) 0.0 - 2.0 mmol/L   Sodium 143 135 - 145 mmol/L   Potassium 4.3 3.5 - 5.1 mmol/L   Calcium, Ion 1.10 (L) 1.15 - 1.40 mmol/L   HCT 33.0 (L) 36.0 - 46.0 %   Hemoglobin 11.2 (L) 12.0 - 15.0 g/dL   Patient temperature HIDE    Sample  type VENOUS   Glucose, capillary     Status: Abnormal   Collection Time: 09/10/19 12:50 AM  Result Value Ref Range   Glucose-Capillary 258 (H) 70 - 99 mg/dL  MRSA PCR Screening     Status: None   Collection Time: 09/10/19 12:54 AM  Result Value Ref Range   MRSA by PCR NEGATIVE NEGATIVE    Comment:        The GeneXpert MRSA Assay (FDA approved for NASAL specimens only), is one component of a comprehensive MRSA colonization surveillance program. It is not intended to diagnose MRSA infection nor to guide or monitor treatment for MRSA infections. Performed at [facility], [place] [address], [place], [place] [address]   Ammonia     Status: None   Collection Time: 09/10/19  2:06 AM  Result Value Ref Range   Ammonia 22 9 - 35 umol/L    Comment: Performed at [facility], [place] [address] [place], [place] [address]  Lactic acid, plasma     Status: Abnormal   Collection Time: 09/10/19  2:06 AM  Result Value Ref Range   Lactic Acid, Venous 6.0 (HH) 0.5 - 1.9 mmol/L    Comment: CRITICAL VALUE NOTED.  VALUE IS CONSISTENT WITH PREVIOUSLY REPORTED AND CALLED VALUE. Performed at [facility], [place] [address] [place], [place] [address]   Glucose, capillary     Status: Abnormal   Collection Time: 09/10/19  2:16 AM  Result Value Ref Range   Glucose-Capillary 180 (H) 70 - 99 mg/dL  Lipid panel     Status: None   Collection Time: 09/10/19  4:04  AM  Result Value Ref Range   Cholesterol 127 0 - 200 mg/dL   Triglycerides 96 <150 mg/dL   HDL 54 >40 mg/dL   Total CHOL/HDL Ratio 2.4 RATIO   VLDL 19 0 - 40 mg/dL   LDL Cholesterol 54 0 - 99 mg/dL    Comment:        Total Cholesterol/HDL:CHD Risk Coronary Heart Disease Risk Table                     Men   Women  1/2 Average Risk   3.4   3.3  Average Risk       5.0   4.4  2 X Average Risk   9.6   7.1  3 X Average Risk  23.4   11.0        Use the calculated Patient Ratio above and the CHD Risk Table to determine the patient's  CHD Risk.        ATP III CLASSIFICATION (LDL):  <100     mg/dL   Optimal  100-129  mg/dL   Near or Above                    Optimal  130-159  mg/dL   Borderline  160-189  mg/dL   High  >190     mg/dL   Very High Performed at [facility] [address] [place], [place] [address]   CK     Status: Abnormal   Collection Time: 09/10/19  4:04 AM  Result Value Ref Range   Total CK 534 (H) 38 - 234 U/L    Comment: Performed at [facility], [place] [address] [place], [place] [address]  Comprehensive metabolic panel     Status: Abnormal   Collection Time: 09/10/19  4:04 AM  Result Value Ref Range   Sodium 144 135 - 145 mmol/L   Potassium 4.1 3.5 - 5.1 mmol/L   Chloride 111 98 - 111 mmol/L   CO2 20 (L) 22 - 32 mmol/L   Glucose, Bld 96 70 - 99 mg/dL   BUN 32 (H) 8 - 23 mg/dL   Creatinine, Ser 2.08 (H) 0.44 - 1.00 mg/dL   Calcium 9.0 8.9 - 10.3 mg/dL   Total Protein 6.3 (L) 6.5 - 8.1 g/dL   Albumin 3.4 (L) 3.5 - 5.0 g/dL   AST 62 (H) 15 - 41 U/L   ALT 47 (H) 0 - 44 U/L   Alkaline Phosphatase 56 38 - 126 U/L   Total Bilirubin 0.7 0.3 - 1.2 mg/dL   GFR calc non Af Amer 24 (L) >60 mL/min   GFR calc Af Amer 28 (L) >60 mL/min   Anion gap 13 5 - 15    Comment: Performed at [facility], [place] [address] [place], [place] [address]  CBC     Status: Abnormal   Collection Time: 09/10/19  4:04 AM  Result Value Ref Range   WBC 14.7 (H) 4.0 - 10.5 K/uL   RBC 3.51 (L) 3.87 - 5.11 MIL/uL   Hemoglobin 11.0 (L) 12.0 - 15.0 g/dL   HCT 31.7 (L) 36.0 - 46.0 %   MCV 90.3 80.0 - 100.0 fL   MCH 31.3 26.0 - 34.0 pg   MCHC 34.7 30.0 - 36.0 g/dL   RDW 11.9 11.5 - 15.5 %   Platelets 291 150 - 400 K/uL   nRBC 0.0 0.0 - 0.2 %  Comment: Performed at [facility], [place] [address], [place], [place] [address]  Glucose, capillary     Status: None   Collection Time: 09/10/19  4:05 AM  Result Value Ref Range   Glucose-Capillary 81 70 - 99 mg/dL  Glucose, capillary     Status: None    Collection Time: 09/10/19  5:11 AM  Result Value Ref Range   Glucose-Capillary 75 70 - 99 mg/dL  Glucose, capillary     Status: Abnormal   Collection Time: 09/10/19  6:12 AM  Result Value Ref Range   Glucose-Capillary 109 (H) 70 - 99 mg/dL  Lactic acid, plasma     Status: Abnormal   Collection Time: 09/10/19  6:15 AM  Result Value Ref Range   Lactic Acid, Venous 3.1 (HH) 0.5 - 1.9 mmol/L    Comment: CRITICAL VALUE NOTED.  VALUE IS CONSISTENT WITH PREVIOUSLY REPORTED AND CALLED VALUE. Performed at [facility], [place] [address] [place], [place] [address]   Glucose, capillary     Status: Abnormal   Collection Time: 09/10/19  7:21 AM  Result Value Ref Range   Glucose-Capillary 145 (H) 70 - 99 mg/dL    Recent Results (from the past 240 hour(s))  SARS Coronavirus 2 [facility] order, Performed in [facility] hospital lab) Nasopharyngeal Urine, Catheterized     Status: None   Collection Time: 09/09/19  4:52 PM   Specimen: Urine, Catheterized; Nasopharyngeal  Result Value Ref Range Status   SARS Coronavirus 2 NEGATIVE NEGATIVE Final    Comment: (NOTE) If result is NEGATIVE SARS-CoV-2 target nucleic acids are NOT DETECTED. The SARS-CoV-2 RNA is generally detectable in upper and lower  respiratory specimens during the acute phase of infection. The lowest  concentration of SARS-CoV-2 viral copies this assay can detect is 250  copies / mL. A negative result does not preclude SARS-CoV-2 infection  and should not be used as the sole basis for treatment or other  patient management decisions.  A negative result may occur with  improper specimen collection / handling, submission of specimen other  than nasopharyngeal swab, presence of viral mutation(s) within the  areas targeted by this assay, and inadequate number of viral copies  (<250 copies / mL). A negative result must be combined with clinical  observations, patient history, and epidemiological information. If result is  POSITIVE SARS-CoV-2 target nucleic acids are DETECTED. The SARS-CoV-2 RNA is generally detectable in upper and lower  respiratory specimens dur ing the acute phase of infection.  Positive  results are indicative of active infection with SARS-CoV-2.  Clinical  correlation with patient history and other diagnostic information is  necessary to determine patient infection status.  Positive results do  not rule out bacterial infection or co-infection with other viruses. If result is PRESUMPTIVE POSTIVE SARS-CoV-2 nucleic acids MAY BE PRESENT.   A presumptive positive result was obtained on the submitted specimen  and confirmed on repeat testing.  While 2019 novel coronavirus  (SARS-CoV-2) nucleic acids may be present in the submitted sample  additional confirmatory testing may be necessary for epidemiological  and / or clinical management purposes  to differentiate between  SARS-CoV-2 and other Sarbecovirus currently known to infect humans.  If clinically indicated additional testing with an alternate test  methodology [phone]) is advised. The SARS-CoV-2 RNA is generally  detectable in upper and lower respiratory sp ecimens during the acute  phase of infection. The expected result is Negative. Fact Sheet for Patients:  StrictlyIdeas.no Fact Sheet for  Healthcare Providers: BankingDealers.co.za This test is not yet approved or cleared by the [place] and has been authorized for detection and/or diagnosis of SARS-CoV-2 by FDA under an Emergency Use Authorization (EUA).  This EUA will remain in effect (meaning this test can be used) for the duration of the COVID-19 declaration under Section 564(b)(1) of the Act, 21 U.S.C. section 360bbb-3(b)(1), unless the authorization is terminated or revoked sooner. Performed at [facility], [place] [address] [place], [place] [address]   CSF culture     Status: None (Preliminary result)    Collection Time: 09/10/19 12:02 AM   Specimen: CSF; Cerebrospinal Fluid  Result Value Ref Range Status   Specimen Description CSF  Final   Special Requests NONE  Final   Gram Stain   Final    WBC PRESENT,BOTH PMN AND MONONUCLEAR NO ORGANISMS SEEN CYTOSPIN SMEAR Performed at [facility], [place] [address], [place], [place] [address]    Culture PENDING  Incomplete   Report Status PENDING  Incomplete  MRSA PCR Screening     Status: None   Collection Time: 09/10/19 12:54 AM  Result Value Ref Range Status   MRSA by PCR NEGATIVE NEGATIVE Final    Comment:        The GeneXpert MRSA Assay (FDA approved for NASAL specimens only), is one component of a comprehensive MRSA colonization surveillance program. It is not intended to diagnose MRSA infection nor to guide or monitor treatment for MRSA infections. Performed at [facility], [place] [address], [place], [place] [address]     Lipid Panel Recent Labs    09/10/19 0404  CHOL 127  TRIG 96  HDL 54  CHOLHDL 2.4  VLDL 19  LDLCALC 54    Studies/Results: Ct Abdomen Pelvis Wo Contrast  Result Date: 09/09/2019 CLINICAL DATA:  67 year old female with shortness of breath. Patient was found on the floor. EXAM: CT CHEST, ABDOMEN AND PELVIS WITHOUT CONTRAST TECHNIQUE: Multidetector CT imaging of the chest, abdomen and pelvis was performed following the standard protocol without IV contrast. COMPARISON:  CT of the abdomen pelvis dated 12/26/2017 FINDINGS: Evaluation of this exam is limited in the absence of intravenous contrast. Evaluation is also limited due to respiratory motion artifact as well as streak artifact caused by patient's arms. CT CHEST FINDINGS Cardiovascular: There is no cardiomegaly or pericardial effusion. There is coronary vascular calcification. The thoracic aorta and central pulmonary arteries are grossly unremarkable on this noncontrast CT. Mediastinum/Nodes: No hilar or mediastinal adenopathy. The esophagus and  the thyroid gland are grossly unremarkable. No mediastinal fluid collection. Lungs/Pleura: Evaluation of the lungs is somewhat limited due to respiratory motion artifact. Focal area of increased hazy density at the left lower lobe (series 4, image 79 and coronal series 5, image 61) may be artifactual and represent respiratory motion. Developing infiltrate is not excluded. Clinical correlation is recommended. No focal consolidation, pleural effusion, or pneumothorax. Mucus secretions noted within the trachea. The central airways otherwise remain patent. Musculoskeletal: No chest wall mass or suspicious bone lesions identified. CT ABDOMEN PELVIS FINDINGS No intra-abdominal free air or free fluid. Hepatobiliary: The liver is grossly unremarkable as visualized. No intrahepatic biliary ductal dilatation. There is layering stone and sludge within the gallbladder. Evaluation of the gallbladder is otherwise very limited due to respiratory motion and streak artifact caused by patient's arms. Ultrasound is recommended for better evaluation of the gallbladder. Pancreas: The pancreas is grossly unremarkable as visualized. Spleen: Normal in size without focal  abnormality. Adrenals/Urinary Tract: The adrenal glands are unremarkable. There is no hydronephrosis or nephrolithiasis on either side. Subcentimeter left renal upper pole hypodense lesions are not characterized. The visualized ureters and urinary bladder appear unremarkable. Stomach/Bowel: Mild diffuse thickened appearance of the colon likely related to underdistention and respiratory motion artifact. Mild colitis is not entirely excluded. Clinical correlation is recommended. There is no bowel obstruction. The appendix is unremarkable as visualized. Vascular/Lymphatic: Mild aortoiliac atherosclerotic disease. The IVC is unremarkable. No portal venous gas. There is no adenopathy. Reproductive: The uterus is suboptimally visualized, hysterectomy. Other: Midline vertical  anterior pelvic wall incisional scar. Musculoskeletal: Degenerative changes of the spine. No acute osseous pathology. IMPRESSION: 1. Respiratory motion artifact versus less likely developing infiltrate in the left lower lobe. Clinical correlation is recommended. 2. Artifact versus mild colitis. Clinical correlation is recommended. No bowel obstruction. 3. Cholelithiasis. Ultrasound is recommended for better evaluation of the gallbladder. Aortic Atherosclerosis (ICD10-I70.0). Electronically Signed   By: Anner Crete M.D.   On: 09/09/2019 19:31   Ct Angio Head W Or Wo Contrast  Result Date: 09/09/2019 CLINICAL DATA:  Unresponsive. EXAM: CT ANGIOGRAPHY HEAD AND NECK TECHNIQUE: Multidetector CT imaging of the head and neck was performed using the standard protocol during bolus administration of intravenous contrast. Multiplanar CT image reconstructions and MIPs were obtained to evaluate the vascular anatomy. Carotid stenosis measurements (when applicable) are obtained utilizing NASCET criteria, using the distal internal carotid diameter as the denominator. CONTRAST:  43m OMNIPAQUE IOHEXOL 350 MG/ML SOLN COMPARISON:  CT head 09/09/2019 FINDINGS: CTA NECK FINDINGS Aortic arch: Standard branching. Imaged portion shows no evidence of aneurysm or dissection. No significant stenosis of the major arch vessel origins. Right carotid system: Heavily calcified plaque right carotid bifurcation. Right internal carotid artery lumen narrowed by 60% diameter stenosis. Moderate narrowing of the proximal right external carotid artery. Left carotid system: Mild atherosclerotic disease left carotid bulb without significant stenosis. Vertebral arteries: Both vertebral arteries patent to the basilar. Mild stenosis at the skull base otherwise widely patent Skeleton: No acute skeletal abnormality. Ossification in the ligamentum flavum on the right at C6-7 could represent a meningioma. This measures approximately 6 x 10 mm. Other neck:  Negative for mass or adenopathy. The patient is intubated. Upper chest: Lung apices clear bilaterally. Review of the MIP images confirms the above findings CTA HEAD FINDINGS Anterior circulation: Extensive calcification in the cavernous carotid bilaterally. Moderate stenosis bilaterally. Anterior and middle cerebral arteries patent bilaterally. Mild stenosis right M1 segment. Left middle cerebral artery patent without stenosis. Anterior cerebral arteries patent bilaterally. Posterior circulation: Atherosclerotic calcification and mild stenosis in both vertebral arteries at the skull base. Both vertebral arteries patent to the basilar. PICA patent bilaterally. Basilar widely patent. Superior cerebellar and posterior cerebral arteries patent bilaterally without significant stenosis. Venous sinuses: Normal venous enhancement. Anatomic variants: None Review of the MIP images confirms the above findings IMPRESSION: 1. 60% diameter stenosis proximal right internal carotid artery due to heavily calcified plaque. Moderate stenosis right cavernous carotid. 2. Mild atherosclerotic calcification left carotid bulb without stenosis. Moderate stenosis left cavernous carotid due to calcific stenosis. 3. Mild stenosis of the vertebral artery bilaterally at the skull base otherwise clear 4. No intracranial large vessel occlusion. Mild stenosis right M1 segment. 5. 6 x 10 mm ossification ligamentum flavum on the right at C6-7 may represent meningioma. Electronically Signed   By: CFranchot GalloM.D.   On: 09/09/2019 21:15   Ct Head Wo Contrast  Result Date: 09/09/2019 CLINICAL DATA:  Altered mental status, found down EXAM: CT HEAD WITHOUT CONTRAST TECHNIQUE: Contiguous axial images were obtained from the base of the skull through the vertex without intravenous contrast. COMPARISON:  11/27/2013 FINDINGS: Brain: No evidence of acute infarction, hemorrhage, hydrocephalus, extra-axial collection or mass lesion/mass effect. Vascular:  Mild atherosclerotic calcifications involving the large vessels of the skull base. No unexpected hyperdense vessel. Skull: Normal. Negative for fracture or focal lesion. Sinuses/Orbits: No acute finding. Other: None. IMPRESSION: No acute intracranial findings. Electronically Signed   By: Davina Poke M.D.   On: 09/09/2019 19:15   Ct Angio Neck W Or Wo Contrast  Result Date: 09/09/2019 CLINICAL DATA:  Unresponsive. EXAM: CT ANGIOGRAPHY HEAD AND NECK TECHNIQUE: Multidetector CT imaging of the head and neck was performed using the standard protocol during bolus administration of intravenous contrast. Multiplanar CT image reconstructions and MIPs were obtained to evaluate the vascular anatomy. Carotid stenosis measurements (when applicable) are obtained utilizing NASCET criteria, using the distal internal carotid diameter as the denominator. CONTRAST:  82m OMNIPAQUE IOHEXOL 350 MG/ML SOLN COMPARISON:  CT head 09/09/2019 FINDINGS: CTA NECK FINDINGS Aortic arch: Standard branching. Imaged portion shows no evidence of aneurysm or dissection. No significant stenosis of the major arch vessel origins. Right carotid system: Heavily calcified plaque right carotid bifurcation. Right internal carotid artery lumen narrowed by 60% diameter stenosis. Moderate narrowing of the proximal right external carotid artery. Left carotid system: Mild atherosclerotic disease left carotid bulb without significant stenosis. Vertebral arteries: Both vertebral arteries patent to the basilar. Mild stenosis at the skull base otherwise widely patent Skeleton: No acute skeletal abnormality. Ossification in the ligamentum flavum on the right at C6-7 could represent a meningioma. This measures approximately 6 x 10 mm. Other neck: Negative for mass or adenopathy. The patient is intubated. Upper chest: Lung apices clear bilaterally. Review of the MIP images confirms the above findings CTA HEAD FINDINGS Anterior circulation: Extensive  calcification in the cavernous carotid bilaterally. Moderate stenosis bilaterally. Anterior and middle cerebral arteries patent bilaterally. Mild stenosis right M1 segment. Left middle cerebral artery patent without stenosis. Anterior cerebral arteries patent bilaterally. Posterior circulation: Atherosclerotic calcification and mild stenosis in both vertebral arteries at the skull base. Both vertebral arteries patent to the basilar. PICA patent bilaterally. Basilar widely patent. Superior cerebellar and posterior cerebral arteries patent bilaterally without significant stenosis. Venous sinuses: Normal venous enhancement. Anatomic variants: None Review of the MIP images confirms the above findings IMPRESSION: 1. 60% diameter stenosis proximal right internal carotid artery due to heavily calcified plaque. Moderate stenosis right cavernous carotid. 2. Mild atherosclerotic calcification left carotid bulb without stenosis. Moderate stenosis left cavernous carotid due to calcific stenosis. 3. Mild stenosis of the vertebral artery bilaterally at the skull base otherwise clear 4. No intracranial large vessel occlusion. Mild stenosis right M1 segment. 5. 6 x 10 mm ossification ligamentum flavum on the right at C6-7 may represent meningioma. Electronically Signed   By: CFranchot GalloM.D.   On: 09/09/2019 21:15   Ct Chest Wo Contrast  Result Date: 09/09/2019 CLINICAL DATA:  68year old female with shortness of breath. Patient was found on the floor. EXAM: CT CHEST, ABDOMEN AND PELVIS WITHOUT CONTRAST TECHNIQUE: Multidetector CT imaging of the chest, abdomen and pelvis was performed following the standard protocol without IV contrast. COMPARISON:  CT of the abdomen pelvis dated 12/26/2017 FINDINGS: Evaluation of this exam is limited in the absence of intravenous contrast. Evaluation is also limited due to respiratory motion artifact as well as streak artifact caused by  patient's arms. CT CHEST FINDINGS Cardiovascular:  There is no cardiomegaly or pericardial effusion. There is coronary vascular calcification. The thoracic aorta and central pulmonary arteries are grossly unremarkable on this noncontrast CT. Mediastinum/Nodes: No hilar or mediastinal adenopathy. The esophagus and the thyroid gland are grossly unremarkable. No mediastinal fluid collection. Lungs/Pleura: Evaluation of the lungs is somewhat limited due to respiratory motion artifact. Focal area of increased hazy density at the left lower lobe (series 4, image 79 and coronal series 5, image 61) may be artifactual and represent respiratory motion. Developing infiltrate is not excluded. Clinical correlation is recommended. No focal consolidation, pleural effusion, or pneumothorax. Mucus secretions noted within the trachea. The central airways otherwise remain patent. Musculoskeletal: No chest wall mass or suspicious bone lesions identified. CT ABDOMEN PELVIS FINDINGS No intra-abdominal free air or free fluid. Hepatobiliary: The liver is grossly unremarkable as visualized. No intrahepatic biliary ductal dilatation. There is layering stone and sludge within the gallbladder. Evaluation of the gallbladder is otherwise very limited due to respiratory motion and streak artifact caused by patient's arms. Ultrasound is recommended for better evaluation of the gallbladder. Pancreas: The pancreas is grossly unremarkable as visualized. Spleen: Normal in size without focal abnormality. Adrenals/Urinary Tract: The adrenal glands are unremarkable. There is no hydronephrosis or nephrolithiasis on either side. Subcentimeter left renal upper pole hypodense lesions are not characterized. The visualized ureters and urinary bladder appear unremarkable. Stomach/Bowel: Mild diffuse thickened appearance of the colon likely related to underdistention and respiratory motion artifact. Mild colitis is not entirely excluded. Clinical correlation is recommended. There is no bowel obstruction. The  appendix is unremarkable as visualized. Vascular/Lymphatic: Mild aortoiliac atherosclerotic disease. The IVC is unremarkable. No portal venous gas. There is no adenopathy. Reproductive: The uterus is suboptimally visualized, hysterectomy. Other: Midline vertical anterior pelvic wall incisional scar. Musculoskeletal: Degenerative changes of the spine. No acute osseous pathology. IMPRESSION: 1. Respiratory motion artifact versus less likely developing infiltrate in the left lower lobe. Clinical correlation is recommended. 2. Artifact versus mild colitis. Clinical correlation is recommended. No bowel obstruction. 3. Cholelithiasis. Ultrasound is recommended for better evaluation of the gallbladder. Aortic Atherosclerosis (ICD10-I70.0). Electronically Signed   By: Anner Crete M.D.   On: 09/09/2019 19:31   Mr Brain Wo Contrast  Result Date: 09/10/2019 CLINICAL DATA:  Initial evaluation for acute encephalopathy, unclear cause. EXAM: MRI HEAD WITHOUT CONTRAST TECHNIQUE: Multiplanar, multiecho pulse sequences of the brain and surrounding structures were obtained without intravenous contrast. COMPARISON:  Prior CT and CTA from 09/09/2019. FINDINGS: Brain: Examination mildly degraded by motion artifact. Cerebral volume within normal limits for age. No significant cerebral white matter changes. Probable single tiny remote lacunar infarct noted at the posterior right internal capsule. No abnormal foci of restricted diffusion to suggest acute or subacute ischemia. Gray-white matter differentiation maintained. No encephalomalacia to suggest chronic cortical infarction. No evidence for acute or chronic intracranial hemorrhage. No mass lesion, midline shift, or mass effect. No hydrocephalus. No extra-axial fluid collection. Normal pituitary gland. Patchy multifocal somewhat rounded foci of T2/FLAIR signal abnormality seen involving the cerebellum bilaterally, right greater than left (series 18, images 7, 6, 5). Additional  focal signal abnormality seen at the left occipital pole (series 18, image 9). Possible mild involvement more superiorly at the right parieto-occipital region (series 18, image 16). No associated hemorrhage or significant mass effect. Relative sparing of the deep gray nuclei and brainstem. Vascular: Major intracranial vascular flow voids are maintained. Skull and upper cervical spine: Craniocervical junction within normal limits. Upper cervical  spine normal. Bone marrow signal intensity normal. No scalp soft tissue abnormality. Sinuses/Orbits: Patient status post bilateral ocular lens replacement. Scattered mucosal thickening noted throughout the paranasal sinuses. Endotracheal and enteric tubes partially visualized, with the enteric tube partially seen coiled within the naso/oropharynx. No significant mastoid effusion. Inner ear structures grossly normal. Other: None. IMPRESSION: 1. Patchy multifocal T2/FLAIR signal abnormality involving the cerebellum bilaterally and left occipital pole. Findings are nonspecific, but could reflect sequelae of PRES. Nonspecific toxic metabolic derangement would be the primary differential consideration. Correlation with history and laboratory values recommended. 2. No other acute intracranial abnormality. Electronically Signed   By: Jeannine Boga M.D.   On: 09/10/2019 03:59   US Abdomen Limited  Result Date: 09/10/2019 CLINICAL DATA:  Gallbladder sludge EXAM: ULTRASOUND ABDOMEN LIMITED RIGHT UPPER QUADRANT COMPARISON:  Abdominal CT from yesterday FINDINGS: Gallbladder: Layering material within the gallbladder consistent with sludge. There also rounded areas measuring up to 6 mm compatible with calculi. No wall thickening or focal tenderness, although the gallbladder is full. Common bile duct: Diameter: 6 mm.  Where visualized, no filling defect. Liver: No focal lesion identified. Within normal limits in parenchymal echogenicity. Portal vein is patent on color Doppler  imaging with normal direction of blood flow towards the liver. IMPRESSION: Gallbladder sludge with probable small nonshadowing calculi. No evidence of cholecystitis or choledocholithiasis. Electronically Signed   By: Monte Fantasia M.D.   On: 09/10/2019 05:47   Dg Chest Portable 1 View  Result Date: 09/09/2019 CLINICAL DATA:  Intubation. EXAM: PORTABLE CHEST 1 VIEW COMPARISON:  Radiographs and CT earlier this day. FINDINGS: Endotracheal tube tip 5.4 cm from the carina at the thoracic inlet. The cardiomediastinal contours are normal. No definite consolidation at the left lung base. Pulmonary vasculature is normal. No confluent consolidation, pleural effusion, or pneumothorax. No acute osseous abnormalities are seen. Overlying artifact obscures the right costophrenic angle. Presumed external artifact in the right supraclavicular region. IMPRESSION: 1. Endotracheal tube tip 5.4 cm from the carina at the thoracic inlet. 2. No acute chest findings. Electronically Signed   By: Keith Rake M.D.   On: 09/09/2019 22:50   Dg Chest Port 1 View  Result Date: 09/09/2019 CLINICAL DATA:  Altered mental status, fever EXAM: PORTABLE CHEST 1 VIEW COMPARISON:  01/03/2013 FINDINGS: The heart size and mediastinal contours are within normal limits. No focal airspace consolidation. No pleural effusion. No pneumothorax. IMPRESSION: No acute cardiopulmonary findings. Electronically Signed   By: Davina Poke M.D.   On: 09/09/2019 17:10    Medications:  Scheduled: . chlorhexidine gluconate (MEDLINE KIT)  15 mL Mouth Rinse BID  . Chlorhexidine Gluconate Cloth  6 each Topical Q0600  . heparin  5,000 Units Subcutaneous Q8H  . mouth rinse  15 mL Mouth Rinse 10 times per day  . pantoprazole (PROTONIX) IV  40 mg Intravenous Daily  . vancomycin variable dose per unstable renal function (pharmacist dosing)   Does not apply See admin instructions   Continuous: . sodium chloride 250 mL (09/10/19 0727)  . acyclovir 500 mg  (09/10/19 0728)  . [START ON 09/11/2019] acyclovir    . ceFEPime (MAXIPIME) IV    . dextrose 5 % and 0.45% NaCl 125 mL/hr (09/10/19 0509)  . insulin 0.8 Units/hr (09/10/19 0400)  . lactated ringers 100 mL/hr at 09/10/19 0118  . propofol (DIPRIVAN) infusion 10 mcg/kg/min (09/10/19 0700)   EEG:  EEG Abnormalities: 1) triphasic waves. 2) generalized irregular slow activity. 3) absent PDR Clinical Interpretation: This EEG is consistent with  a generalized nonspecific cerebral dysfunction (encephalopathy). There was no seizure or seizure predisposition recorded on this study.    Impression: 68 year old female presenting after being found down and unresponsive. Hyperglycemic on initial ED assessment. Obtunded on initial Neurology assessment yesterday night, with no response to commands, no blink to threat and flexor posturing to noxious stimuli.  1. Her exam was concerning for possible brainstem pathology, but CTA of head and neck was negative for basilar occlusion, with atherosclerotic disease noted.  2. Most likely this represents some form of toxic/metabolic encephalopathy based on clinical features. MRI brain was reviewed with Dr. Leonel Ramsay and there is strikingly symmetric abnormal T2 hyperinteinsity diffusely within the basal ganglia and thalami, in addition to the small supratentorial and infratentorial focal lesions that are more prominently seen in terms of signal intensity.  Possible etiologies would be diabetic ketoacidosis, hepatic encephalopathy (unlikely given only mildly elevated transaminases and normal ammonia), drug overdose, hypoglycemic brain injury and CO poisoning. The latter two are felt to be the most likely components of the DDx.  3. Ammonia and TSH came back normal.  4. LP results with mildly elevated WBC of 12 and protein of 104. The CSF findings in conjunction with MRI abnormalities most likely reflect diffuse neuronal damage. Although there is a neutrophilic predominance of  the WBCs, the total number of WBC per microliter is felt to be significantly lower than would be expected from an infection and again is felt to be due to an acute response to diffuse neuronal damage.   Recommendations: 1. UDS has been ordered 2. LTM EEG is pending 3. Unable to obtain follow up MRI brain with contrast due to low eGFR.  4. Continue to monitor closely  35 minutes spent in the neurological evaluation and management of this critically ill patient   LOS: 1 day   _0  signed: Dr. Kerney Elbe 09/10/2019  8:20 AM

## 2019-09-10 NOTE — Progress Notes (Signed)
LTM reviewed till 27. Showed generalized triphasic waves, maximal bifrontal and continuous generalized 2-5hz   theta-delta activity. No seizures were seen.  Please review final eeg report for details.   Amanda [name] Amanda [name]

## 2019-09-10 NOTE — Progress Notes (Signed)
NAME:  Amanda [name], MRN:  [id number], DOB:  13-Aug-1951, LOS: 1 ADMISSION DATE:  09/09/2019, CONSULTATION DATE:  09/09/2019 REFERRING MD:  Dr. Rex Kras, CHIEF COMPLAINT:  Acute encephalopathy  Brief History   68 year old female presenting from home with acute encephalopathy.  No clear LSW.  Remained hypertensive, hyperglycemic, febrile, and ongoing unresponsiveness requiring intubation for airway protection.  CT/ CTA neg.   Neurology consulted, PCCM to admit.  History of present illness   HPI obtained from medical chart review as patient is sedated on mechanical ventilation and no family able to be reached.   68 year old female with history of diabetes, HTN, and neuropathy presenting from home with altered mental status.  EMS had reported that patient was found alert with eyes open, not following commands, and left arm contracted in at the base of her disabled husband bed covered in feces.  Husband not ale to provide any insight on scene or last seen well. Neighbors had reported that her car door had been opened since last night and front door found open.  Found to glucose reading high.  In ER, patient febrile 101.9 rectal, hypertensive 207/104, and tachycardic.  CT head and CTA head and neck without acute findings.  Labs noted for  Na 139, CO2 21, glucose 522, BUN 36, sCr 1.87, CK 125, LA 2.6->3, WBC 14.4, normal coags, UA with high glucose, 5 ketones, >300 protein.  She required intubation for airway protection after some desaturation episodes in the 80's.  Neurology consulted.  Cultures sent and started on empiric vancomycin, cefepime, and flagyl and ceftriaxone add for meningeal coverage.  30 cc/kg fluid bolus given.  EEG showing negative for seizure activity, showing encephalopathy.  LP pending.  PCCM to admit.   Past Medical History  Never smoker, diabetes, neuropathy, HTN, [place] [facility] Events   09/09/2019  Consults:  Neurology  Procedures:  9/21 ETT >>  9/21 LP    Significant Diagnostic Tests:  9/21 [facility] >>neg 9/21 [facility]/  abd >> 1. Respiratory motion artifact versus less likely developing infiltrate in the left lower lobe. Clinical correlation is recommended. 2. Artifact versus mild colitis. Clinical correlation is recommended. No bowel obstruction. 3. Cholelithiasis. Ultrasound is recommended for better evaluation of the gallbladder. Aortic Atherosclerosis   9/21 CTA head/ neck >> 1. 60% diameter stenosis proximal right internal carotid artery due to heavily calcified plaque. Moderate stenosis right cavernous carotid. 2. Mild atherosclerotic calcification left carotid bulb without stenosis. Moderate stenosis left cavernous carotid due to calcific stenosis. 3. Mild stenosis of the vertebral artery bilaterally at the skull base otherwise clear 4. No intracranial large vessel occlusion. Mild stenosis right M1 segment. 5. 6 x 10 mm ossification ligamentum flavum on the right at C6-7 may represent meningioma.  9/21 EGG >>generalized nonspecific cerebral dysfunction (encephalopathy). There was no seizure or seizure predisposition recorded on this study. 9/21 LP>> clear opening pressure 14 white count 12>> 09/08/2019 MRI of the brain>> no acute abnormality show findings T2/flair signal abnormality which could be pres.   Micro Data:  9/21 SARS CoV2 >> negative  9/21 BCx 2 >> 9/21 CSF >> white count 12 And 21 2020 C. Difficile>>  Antimicrobials:  9/21 flagyl 9/21 vanc >> 9/21 cefepime >> 9/21 ceftriaxone >>  Interim history/subjective:  Currently on propofol which have asked the nurses to wean.  Not following any commands.  MRI brain shows patchy multifocal T2/flair signal with questionable findings with specific PRES  Objective   Blood pressure [name])  195/64, pulse (!) 106, temperature (!) 102.3 F (39.1 C), resp. rate 20, height 5\' 4"  (1.626 m), weight 56.3 kg, SpO2 100 %.    Vent Mode: PSV;CPAP FiO2 (%):  [40 %-100 %] 40 % Set Rate:  [18  bmp] 18 bmp Vt Set:  [440 mL] 440 mL PEEP:  [5 cmH20] 5 cmH20 Pressure Support:  [10 cmH20] 10 cmH20   Intake/Output Summary (Last 24 hours) at 09/10/2019 0817 Last data filed at 09/10/2019 0700 Gross per 24 hour  Intake 2869.72 ml  Output 400 ml  Net 2469.72 ml   Filed Weights   09/09/19 1700 09/10/19 0051 09/10/19 0408  Weight: 61.3 kg 56.3 kg 56.3 kg   Examination: General: 68 year old female currently on propofol drip does not follow commands HEENT: Endotracheal tube in place orogastric tube in place.  Pupils are pinpoint. Neuro: Does not follow verbal commands.  Biting on tube. CV: Heart sounds regular regular rate and rhythm PULM: Bilateral rhonchi noted GI: soft, bsx4 active  Extremities: warm/dry, 1+ edema  Skin: no rashes or lesions   Resolved Hospital Problem list     Assessment & Plan:   Acute encephalopathy Questionable pres  P:  MRI as noted LP performed Wean sedation to evaluate neurological status Neurology's input greatly appreciated Await urine drug screen     Acute respiratory insufficiency Possible aspiration   P:  Vent bundle Wean FiO2 as tolerated  Daily SBT Sedation protocol   Hypertensive crisis -prior TTE 12/2018 mild HFpEF, EF 55-60% P:  Goal of this blood pressure less than 664 systolic PRN labetalol Restart Cleviprex with goal systolic blood pressure less than 180 Currently holding oral antihypertensive     SIRS/sepsis  Leukocytosis P:  Monitor culture data Procalcitonin 3.98 Status post LP with moderate WBCs Respiratory virus panel Anterior and vertical antimicrobial therapy along with acyclovir     AKI on CKD - previous sCr 1.07 AMGA/ lactic acidosis - likely type B, on metformin at home +/- type A w/ possible sepsis P:  We will discontinue lactated Ringer's and continue D5 and half ns Foley Monitor electrolytes and urine output replete as needed    DM, poorly controlled Hyperglycemia  P:  Insulin drip  per protocol Hourly CBG/ Insulin gtt  Beta-hdroxybutyric level 0.52   Diarrhea CT showing mild diffuse colitis and Cholelithiasis.   LFTs mildly elevated  09/08/2019 ultrasound right upper quadrant shows gallbladder sludge with probable small nonshadowing calculi.  No acute evidence of cholecystitis P:  Monitor LFTs Stool for GI panel C. difficile ordered Trend lactic acid    Best practice:  Diet: NPO/nutrition consult 09/07/2021 initiate tube feeding Pain/Anxiety/Delirium protocol (if indicated): prn VAP protocol (if indicated): yes DVT prophylaxis: heparin SQ GI prophylaxis: PPI Glucose control: hourly/ insulin gtt Mobility: BR Code Status: full  Family Communication: Unable.  Attempted to call daughter, Chanell, and home number several times without answer.  Reportedly patient's husband is disabled and patient appears to be care provider.   Disposition: ICU  Labs   CBC: Recent Labs  Lab 09/09/19 1636 09/09/19 1720 09/09/19 2125 09/09/19 2357 09/10/19 0041 09/10/19 0404  WBC 14.4*  --   --   --   --  14.7*  NEUTROABS 13.1*  --   --   --   --   --   HGB 12.2 12.6 11.2* 10.5* 11.2* 11.0*  HCT 36.4 37.0 33.0* 31.0* 33.0* 31.7*  MCV 90.5  --   --   --   --  90.3  PLT 360  --   --   --   --  716    Basic Metabolic Panel: Recent Labs  Lab 09/09/19 1636  09/09/19 1930 09/09/19 2110 09/09/19 2125 09/09/19 2357 09/10/19 0032 09/10/19 0041 09/10/19 0404  NA 139   < > 138 139 138 143 143 143 144  K 4.6   < > 4.7 5.1 4.7 4.4 4.4 4.3 4.1  CL 101  --  103 105  --   --  110  --  111  CO2 21*  --  19* 18*  --   --  19*  --  20*  GLUCOSE 522*  --  532* 491*  --   --  337*  --  96  BUN 36*  --  36* 33*  --   --  31*  --  32*  CREATININE 1.87*  --  1.74* 1.82*  --   --  1.80*  --  2.08*  CALCIUM 10.0  --  9.5 9.1  --   --  9.0  --  9.0  MG  --   --   --   --   --   --  1.5*  --   --    < > = values in this interval not displayed.   GFR: Estimated Creatinine  Clearance: 22.7 mL/min (A) (by C-G formula based on SCr of 2.08 mg/dL (H)). Recent Labs  Lab 09/09/19 1636 09/09/19 1930 09/10/19 0032 09/10/19 0206 09/10/19 0404 09/10/19 0615  PROCALCITON  --   --  3.98  --   --   --   WBC 14.4*  --   --   --  14.7*  --   LATICACIDVEN 2.6* 3.0*  --  6.0*  --  3.1*    Liver Function Tests: Recent Labs  Lab 09/09/19 1636 09/10/19 0404  AST 45* 62*  ALT 51* 47*  ALKPHOS 79 56  BILITOT 1.2 0.7  PROT 7.8 6.3*  ALBUMIN 4.5 3.4*   No results for input(s): LIPASE, AMYLASE in the last 168 hours. Recent Labs  Lab 09/10/19 0206  AMMONIA 22    ABG    Component Value Date/Time   PHART 7.386 09/09/2019 2357   PCO2ART 35.6 09/09/2019 2357   PO2ART 370.0 (H) 09/09/2019 2357   HCO3 17.6 (L) 09/10/2019 0041   TCO2 18 (L) 09/10/2019 0041   ACIDBASEDEF 6.0 (H) 09/10/2019 0041   O2SAT 87.0 09/10/2019 0041     Coagulation Profile: Recent Labs  Lab 09/09/19 1636  INR 1.1    Cardiac Enzymes: Recent Labs  Lab 09/09/19 1636 09/10/19 0404  CKTOTAL 125 534*    HbA1C: Hemoglobin A1C  Date/Time Value Ref Range Status  10/08/2018 03:41 PM 7.9 (A) 4.0 - 5.6 % Final    Comment:    CBG 54  results reported to [place] [id number] 10-08-18  05/07/2018 03:55 PM 9.1 (A) 4.0 - 5.6 % Final   HbA1c POC (<> result, manual entry)  Date/Time Value Ref Range Status  07/23/2018 02:11 PM >14.0 (A) 4.0 - 5.6 % Final   Hgb A1c MFr Bld  Date/Time Value Ref Range Status  09/09/2019 09:10 PM 10.9 (H) 4.8 - 5.6 % Final    Comment:    (NOTE) Pre diabetes:          5.7%-6.4% Diabetes:              >6.4% Glycemic control for   <7.0% adults with diabetes   01/02/2013 05:05  PM 15.8 (H) <5.7 % Final    Comment:    (NOTE)                                                                       According to the ADA Clinical Practice Recommendations for 2011, when HbA1c is used as a screening test:  >=6.5%   Diagnostic of Diabetes Mellitus           (if  abnormal result is confirmed) 5.7-6.4%   Increased risk of developing Diabetes Mellitus References:Diagnosis and Classification of Diabetes Mellitus,Diabetes TUUE,2800,34(JZPHX 1):S62-S69 and Standards of Medical Care in         Diabetes - 2011,Diabetes TAVW,9794,80 (Suppl 1):S11-S61.    CBG: Recent Labs  Lab 09/10/19 0216 09/10/19 0405 09/10/19 0511 09/10/19 0612 09/10/19 0721  GLUCAP 180* 81 75 109* 145*     App cct 36 min      Richardson Landry Minor ACNP Maryanna Shape PCCM Pager [phone] till 1 pm If no answer page [phone] 09/10/2019, 8:18 AM

## 2019-09-10 NOTE — Progress Notes (Signed)
vLTM EEG running (new leads used). Notified Neuro.

## 2019-09-11 ENCOUNTER — Inpatient Hospital Stay (HOSPITAL_COMMUNITY): Payer: Medicare Other

## 2019-09-11 LAB — CBC WITH DIFFERENTIAL/PLATELET
Abs Immature Granulocytes: 0 10*3/uL (ref 0.00–0.07)
Band Neutrophils: 1 %
Basophils Absolute: 0 10*3/uL (ref 0.0–0.1)
Basophils Relative: 0 %
Eosinophils Absolute: 0 10*3/uL (ref 0.0–0.5)
Eosinophils Relative: 0 %
HCT: 26.5 % — ABNORMAL LOW (ref 36.0–46.0)
Hemoglobin: 9.1 g/dL — ABNORMAL LOW (ref 12.0–15.0)
Immature Granulocytes: 0 %
Lymphocytes Relative: 7 %
Lymphs Abs: 1 10*3/uL (ref 0.7–4.0)
MCH: 31.5 pg (ref 26.0–34.0)
MCHC: 34.3 g/dL (ref 30.0–36.0)
MCV: 91.7 fL (ref 80.0–100.0)
Monocytes Absolute: 0.6 10*3/uL (ref 0.1–1.0)
Monocytes Relative: 4 %
Neutro Abs: 12.4 10*3/uL — ABNORMAL HIGH (ref 1.7–7.7)
Neutrophils Relative %: 88 %
Platelets: 191 10*3/uL (ref 150–400)
RBC: 2.89 MIL/uL — ABNORMAL LOW (ref 3.87–5.11)
RDW: 12.3 % (ref 11.5–15.5)
WBC: 13.9 10*3/uL — ABNORMAL HIGH (ref 4.0–10.5)
nRBC: 0 % (ref 0.0–0.2)
nRBC: 0 /100 WBC

## 2019-09-11 LAB — RESPIRATORY PANEL BY PCR

## 2019-09-11 LAB — BASIC METABOLIC PANEL
Anion gap: 9 (ref 5–15)
BUN: 51 mg/dL — ABNORMAL HIGH (ref 8–23)
CO2: 19 mmol/L — ABNORMAL LOW (ref 22–32)
Calcium: 8.1 mg/dL — ABNORMAL LOW (ref 8.9–10.3)
Chloride: 111 mmol/L (ref 98–111)
Creatinine, Ser: 2.25 mg/dL — ABNORMAL HIGH (ref 0.44–1.00)
GFR calc Af Amer: 25 mL/min — ABNORMAL LOW (ref >60)
GFR calc non Af Amer: 22 mL/min — ABNORMAL LOW (ref >60)
Glucose, Bld: 374 mg/dL — ABNORMAL HIGH (ref 70–99)
Potassium: 3.8 mmol/L (ref 3.5–5.1)
Sodium: 139 mmol/L (ref 135–145)

## 2019-09-11 LAB — PHOSPHORUS
Phosphorus: 3.2 mg/dL (ref 2.5–4.6)
Phosphorus: 3.1 mg/dL (ref 2.5–4.6)

## 2019-09-11 LAB — MAGNESIUM
Magnesium: 2.4 mg/dL (ref 1.7–2.4)
Magnesium: 2.5 mg/dL — ABNORMAL HIGH (ref 1.7–2.4)

## 2019-09-11 LAB — GLUCOSE, CAPILLARY
Glucose-Capillary: 134 mg/dL — ABNORMAL HIGH (ref 70–99)
Glucose-Capillary: 139 mg/dL — ABNORMAL HIGH (ref 70–99)
Glucose-Capillary: 155 mg/dL — ABNORMAL HIGH (ref 70–99)
Glucose-Capillary: 211 mg/dL — ABNORMAL HIGH (ref 70–99)
Glucose-Capillary: 229 mg/dL — ABNORMAL HIGH (ref 70–99)
Glucose-Capillary: 277 mg/dL — ABNORMAL HIGH (ref 70–99)
Glucose-Capillary: 305 mg/dL — ABNORMAL HIGH (ref 70–99)
Glucose-Capillary: 326 mg/dL — ABNORMAL HIGH (ref 70–99)
Glucose-Capillary: 353 mg/dL — ABNORMAL HIGH (ref 70–99)

## 2019-09-11 LAB — CK: Total CK: 2271 U/L — ABNORMAL HIGH (ref 38–234)

## 2019-09-11 LAB — PROCALCITONIN: Procalcitonin: 6.48 ng/mL

## 2019-09-11 LAB — HSV DNA BY PCR (REFERENCE LAB)
HSV 1 DNA: NEGATIVE
HSV 2 DNA: NEGATIVE

## 2019-09-11 LAB — PATHOLOGIST SMEAR REVIEW: Path Review: 9222020

## 2019-09-11 MED ORDER — INSULIN DETEMIR 100 UNIT/ML ~~LOC~~ SOLN
5.0000 [IU] | Freq: Once | SUBCUTANEOUS | Status: AC
  Administered 2019-09-11: 5 [IU] via SUBCUTANEOUS
  Filled 2019-09-11: qty 0.05

## 2019-09-11 MED ORDER — SODIUM BICARBONATE 8.4 % IV SOLN
100.0000 meq | Freq: Once | INTRAVENOUS | Status: AC
  Administered 2019-09-11: 100 meq via INTRAVENOUS
  Filled 2019-09-11: qty 100

## 2019-09-11 MED ORDER — INSULIN DETEMIR 100 UNIT/ML ~~LOC~~ SOLN
10.0000 [IU] | Freq: Once | SUBCUTANEOUS | Status: DC
  Filled 2019-09-11: qty 0.1

## 2019-09-11 MED ORDER — INSULIN REGULAR(HUMAN) IN NACL 100-0.9 UT/100ML-% IV SOLN
INTRAVENOUS | Status: DC
  Administered 2019-09-11: 2.9 [IU]/h via INTRAVENOUS
  Filled 2019-09-11: qty 100

## 2019-09-11 MED ORDER — MIDAZOLAM HCL 2 MG/2ML IJ SOLN
0.5000 mg | INTRAMUSCULAR | Status: DC | PRN
  Administered 2019-09-11 – 2019-09-13 (×8): 1 mg via INTRAVENOUS
  Filled 2019-09-11 (×7): qty 2

## 2019-09-11 MED ORDER — DEXMEDETOMIDINE HCL IN NACL 400 MCG/100ML IV SOLN
0.4000 ug/kg/h | INTRAVENOUS | Status: DC
  Administered 2019-09-11: 1.2 ug/kg/h via INTRAVENOUS
  Administered 2019-09-11: 0.4 ug/kg/h via INTRAVENOUS
  Administered 2019-09-12 – 2019-09-13 (×5): 1.2 ug/kg/h via INTRAVENOUS
  Filled 2019-09-11 (×7): qty 100

## 2019-09-11 MED ORDER — INSULIN ASPART 100 UNIT/ML ~~LOC~~ SOLN
0.0000 [IU] | SUBCUTANEOUS | Status: DC
  Administered 2019-09-11: 11 [IU] via SUBCUTANEOUS
  Administered 2019-09-11: 5 [IU] via SUBCUTANEOUS
  Administered 2019-09-11: 2 [IU] via SUBCUTANEOUS
  Administered 2019-09-11: 3 [IU] via SUBCUTANEOUS
  Administered 2019-09-11 – 2019-09-12 (×2): 2 [IU] via SUBCUTANEOUS
  Administered 2019-09-13: 3 [IU] via SUBCUTANEOUS
  Administered 2019-09-13 – 2019-09-14 (×5): 2 [IU] via SUBCUTANEOUS
  Administered 2019-09-14: 8 [IU] via SUBCUTANEOUS
  Administered 2019-09-14: 5 [IU] via SUBCUTANEOUS
  Administered 2019-09-15 (×5): 3 [IU] via SUBCUTANEOUS
  Administered 2019-09-15 – 2019-09-16 (×2): 2 [IU] via SUBCUTANEOUS
  Administered 2019-09-16 (×2): 5 [IU] via SUBCUTANEOUS
  Administered 2019-09-16 – 2019-09-17 (×2): 2 [IU] via SUBCUTANEOUS
  Administered 2019-09-17: 3 [IU] via SUBCUTANEOUS
  Administered 2019-09-17: 2 [IU] via SUBCUTANEOUS
  Administered 2019-09-17: 8 [IU] via SUBCUTANEOUS
  Administered 2019-09-17: 2 [IU] via SUBCUTANEOUS
  Administered 2019-09-17: 8 [IU] via SUBCUTANEOUS
  Administered 2019-09-18: 3 [IU] via SUBCUTANEOUS
  Administered 2019-09-18: 5 [IU] via SUBCUTANEOUS
  Administered 2019-09-18: 8 [IU] via SUBCUTANEOUS
  Administered 2019-09-18: 2 [IU] via SUBCUTANEOUS
  Administered 2019-09-18: 5 [IU] via SUBCUTANEOUS
  Administered 2019-09-19: 3 [IU] via SUBCUTANEOUS
  Administered 2019-09-19 (×2): 2 [IU] via SUBCUTANEOUS
  Administered 2019-09-19: 8 [IU] via SUBCUTANEOUS
  Administered 2019-09-19: 3 [IU] via SUBCUTANEOUS
  Administered 2019-09-20: 2 [IU] via SUBCUTANEOUS
  Administered 2019-09-20: 3 [IU] via SUBCUTANEOUS
  Administered 2019-09-20 (×2): 5 [IU] via SUBCUTANEOUS
  Administered 2019-09-20: 3 [IU] via SUBCUTANEOUS
  Administered 2019-09-20: 2 [IU] via SUBCUTANEOUS
  Administered 2019-09-21: 5 [IU] via SUBCUTANEOUS
  Administered 2019-09-21: 3 [IU] via SUBCUTANEOUS
  Administered 2019-09-21: 2 [IU] via SUBCUTANEOUS
  Administered 2019-09-21: 3 [IU] via SUBCUTANEOUS
  Administered 2019-09-21: 5 [IU] via SUBCUTANEOUS
  Administered 2019-09-21: 8 [IU] via SUBCUTANEOUS
  Administered 2019-09-22: 5 [IU] via SUBCUTANEOUS
  Administered 2019-09-22: 3 [IU] via SUBCUTANEOUS
  Administered 2019-09-22: 8 [IU] via SUBCUTANEOUS
  Administered 2019-09-22: 3 [IU] via SUBCUTANEOUS
  Administered 2019-09-22: 5 [IU] via SUBCUTANEOUS
  Administered 2019-09-23: 8 [IU] via SUBCUTANEOUS
  Administered 2019-09-23 (×2): 2 [IU] via SUBCUTANEOUS

## 2019-09-11 MED ORDER — FENTANYL CITRATE (PF) 100 MCG/2ML IJ SOLN
25.0000 ug | INTRAMUSCULAR | Status: DC | PRN
  Administered 2019-09-12 – 2019-09-13 (×2): 100 ug via INTRAVENOUS
  Filled 2019-09-11 (×2): qty 2

## 2019-09-11 NOTE — Procedures (Signed)
Patient Name: Amanda [name]  MRN: [id number]  Epilepsy Attending: Lora Havens  Referring Physician/Provider: Dr Kerney Elbe Duration: 09/10/2019 1595 to 09/11/2019 0856  Patient history: 68 year old female with altered mental status. EEG to evaluate for seizures.  Level of alertness: Comatose  AEDs during EEG study: None  Technical aspects: This EEG study was done with scalp electrodes positioned according to the 10-20 International system of electrode placement. Electrical activity was acquired at a sampling rate of 500Hz  and reviewed with a high frequency filter of 70Hz  and a low frequency filter of 1Hz . EEG data were recorded continuously and digitally stored.   Description: EEG showed continuous generalized 2 to 5 Hz theta-delta slowing.  Triphasic waves at 3.5 Hz, maximal bifrontal were also noted.  EEG was reactive to tactile stimuli. Hyperventilation and photic stimulation were not performed.  Abnormality 1.  Triphasic waves, maximal bifrontal 2.  Continuous slow, generalized  IMPRESSION:  This study is suggestive of severe diffuse encephalopathy, possibly due to toxic metabolic etiology. No seizures or epileptiform discharges were seen throughout the recording.  Aidah Forquer Barbra Sarks

## 2019-09-11 NOTE — Procedures (Signed)
Patient Name: Amanda [name]  MRN: [id number]  Epilepsy Attending: Lora Havens  Referring Physician/Provider: Dr Kerney Elbe Duration: 09/11/2019 0857 to 09/11/2019 1137  Patient history: 68 year old female with altered mental status. EEG to evaluate for seizures.  Level of alertness: Comatose  AEDs during EEG study: None  Technical aspects: This EEG study was done with scalp electrodes positioned according to the 10-20 International system of electrode placement. Electrical activity was acquired at a sampling rate of 500Hz  and reviewed with a high frequency filter of 70Hz  and a low frequency filter of 1Hz . EEG data were recorded continuously and digitally stored.   Description: EEG showed continuous generalized 2 to 5 Hz theta-delta slowing.  Triphasic waves at 3.5 Hz, maximal bifrontal were also noted.  EEG was reactive to tactile stimuli. Hyperventilation and photic stimulation were not performed.  Abnormality 1.  Triphasic waves, maximal bifrontal 2.  Continuous slow, generalized  IMPRESSION:  This study is suggestive of severe diffuse encephalopathy, possibly due to toxic metabolic etiology. No seizures or epileptiform discharges were seen throughout the recording.  Cree Napoli Barbra Sarks

## 2019-09-11 NOTE — Progress Notes (Addendum)
Inpatient Diabetes Program Recommendations  AACE/ADA: New Consensus Statement on Inpatient Glycemic Control   Target Ranges:  Prepandial:   less than 140 mg/dL      Peak postprandial:   less than 180 mg/dL (1-2 hours)      Critically ill patients:  140 - 180 mg/dL  Results for Amanda [name], Amanda [name] (MRN [id number]) as of 09/11/2019 09:14  Ref. Range 09/10/2019 08:23 09/10/2019 09:32 09/10/2019 10:15 09/10/2019 11:24 09/10/2019 12:14 09/10/2019 15:54 09/10/2019 20:06 09/11/2019 00:05 09/11/2019 03:37 09/11/2019 06:47  Glucose-Capillary Latest Ref Range: 70 - 99 mg/dL 173 (H) 153 (H) 146 (H)  Levemir 10 units  IV insulin 1.7 units/hr 139 (H)     IV insulin 0.8 units/hr 144 (H)  Novolog 1 unit  IV insulin off 86 160 (H)  Novolog 2 units  Levemir 10 units 229 (H)  Novolog 3 units 277 (H) 353 (H)  IV insulin 2.9 unit/hr  Results for [name], [name] (MRN [id number]) as of 09/11/2019 09:14  Ref. Range 07/23/2018 14:11 10/08/2018 15:41 09/09/2019 21:10  Hemoglobin A1C Latest Ref Range: 4.8 - 5.6 % >14.0 (A) 7.9 (A) 10.9 (H)   Review of Glycemic Control  Diabetes history: DM2 Outpatient Diabetes medications: Metformin XR 1000 mg BID, Ozempic 0.25 mg Qweek Current orders for Inpatient glycemic control: Novolog 0-15 units Q4H; Vital @ 65 ml/hr  Inpatient Diabetes Program Recommendations:   Insulin-Basal: Please consider ordering Levemir 10 units Q24H starting now.  Insulin-Correction: Please consider discontinuing current Novolog correction scale and use ICU Glycemic Control order set to order Novolog correction Q4H.  Insulin-Tube Feeding Coverage: Per RN tube feeding are on hold due to vomiting but will likely be restarted at half of the initial rate. Therefore, once tube feeding is restarted, please consider ordering Novolog 2 units Q4H for tube feeding coverage. If tube feeding is stopped or held then Novolog tube feeding coverage should also be stopped or held. Once tube feeding is increased back  up to 65 ml/hr, please increase tube feeding coverage.  HgbA1C: A1C 10.9% on 09/08/19 indicating an average glucose of 266 mg/dl over the past 2-3 months.  NOTE: Patient is currently intubated and ordered tube feeding. In reviewing chart, noted patient was restarted on IV insulin at 5:21 am today per ICU Glycemic Control order set since glucose consistently over 200 mg/dl. Apolonio Schneiders, RN reports IV insulin has been discontinued and SQ insulin is ordered and patient just received Novolog 11 units at 9:16 am today. Anticipate patient will require basal insulin and tube feeding coverage insulin. Recommend discontinuing current Novolog correction scale and use ICU Glycemic Control order set to order Levemir 10 units Q24H (starting now), Novolog 2 units Q4H for tube feeding coverage, and Novolog correction Q4H.  Thanks, Barnie Alderman, RN, MSN, CDE Diabetes Coordinator Inpatient Diabetes Program [phone] (Team Pager from 8am to 5pm)

## 2019-09-11 NOTE — Progress Notes (Signed)
NAME:  Amanda [name], MRN:  [id number], DOB:  03-13-1951, LOS: 2 ADMISSION DATE:  09/09/2019, CONSULTATION DATE:  09/09/2019 REFERRING MD:  Dr. Clarene Duke, CHIEF COMPLAINT:  Acute encephalopathy  Brief History   68 year old female presenting from home with acute encephalopathy.  No clear LSW.  Remained hypertensive, hyperglycemic, febrile, and ongoing unresponsiveness requiring intubation for airway protection.  CT/ CTA neg.   Neurology consulted, PCCM to admit.  History of present illness   HPI obtained from medical chart review as patient is sedated on mechanical ventilation and no family able to be reached.   68 year old female with history of diabetes, HTN, and neuropathy presenting from home with altered mental status.  EMS had reported that patient was found alert with eyes open, not following commands, and left arm contracted in at the base of her disabled husband bed covered in feces.  Husband not ale to provide any insight on scene or last seen well. Neighbors had reported that her car door had been opened since last night and front door found open.  Found to glucose reading high.  In ER, patient febrile 101.9 rectal, hypertensive 207/104, and tachycardic.  CT head and CTA head and neck without acute findings.  Labs noted for  Na 139, CO2 21, glucose 522, BUN 36, sCr 1.87, CK 125, LA 2.6->3, WBC 14.4, normal coags, UA with high glucose, 5 ketones, >300 protein.  She required intubation for airway protection after some desaturation episodes in the 80's.  Neurology consulted.  Cultures sent and started on empiric vancomycin, cefepime, and flagyl and ceftriaxone add for meningeal coverage.  30 cc/kg fluid bolus given.  EEG showing negative for seizure activity, showing encephalopathy.  LP pending.  PCCM to admit.   Past Medical History  Never smoker, diabetes, neuropathy, HTN, HLD  Significant Hospital Events   09/11/2019 vomited x1  Consults:  Neurology  Procedures:  9/21 ETT >>  9/21  LP   Significant Diagnostic Tests:  9/21 [facility] >>neg 9/21 [facility]chest/  abd >> 1. Respiratory motion artifact versus less likely developing infiltrate in the left lower lobe. Clinical correlation is recommended. 2. Artifact versus mild colitis. Clinical correlation is recommended. No bowel obstruction. 3. Cholelithiasis. Ultrasound is recommended for better evaluation of the gallbladder. Aortic Atherosclerosis   9/21 CTA head/ neck >> 1. 60% diameter stenosis proximal right internal carotid artery due to heavily calcified plaque. Moderate stenosis right cavernous carotid. 2. Mild atherosclerotic calcification left carotid bulb without stenosis. Moderate stenosis left cavernous carotid due to calcific stenosis. 3. Mild stenosis of the vertebral artery bilaterally at the skull base otherwise clear 4. No intracranial large vessel occlusion. Mild stenosis right M1 segment. 5. 6 x 10 mm ossification ligamentum flavum on the right at C6-7 may represent meningioma.  9/21 EGG >>generalized nonspecific cerebral dysfunction (encephalopathy). There was no seizure or seizure predisposition recorded on this study. 9/21 LP>> clear opening pressure 14 white count 12>> 09/08/2019 MRI of the brain>> no acute abnormality show findings T2/flair signal abnormality which could be pres.   Micro Data:  9/21 SARS CoV2 >> negative  9/21 BCx 2 >> 9/21 CSF >> white count 12 9/ 21 2020 C. Difficile>> no bowel movements  Antimicrobials:  9/21 flagyl off 9/21 vanc >> 9/21 cefepime >> 9/21 ceftriaxone off 09/10/2019 acyclovir>>  Interim history/subjective:  Currently on propofol which have asked the nurses to wean.  Not following any commands.  MRI brain shows patchy multifocal T2/flair signal with questionable findings with specific  PRES  Objective   Blood pressure (!) 161/75, pulse 81, temperature 98.2 F (36.8 C), temperature source Oral, resp. rate 18, height 5\' 4"  (1.626 m), weight 56.3 kg, SpO2 100 %.     Vent Mode: CPAP;PSV FiO2 (%):  [40 %] 40 % Set Rate:  [18 bmp] 18 bmp Vt Set:  [440 mL] 440 mL PEEP:  [5 cmH20] 5 cmH20 Pressure Support:  [10 cmH20] 10 cmH20 Plateau Pressure:  [10 cmH20-13 cmH20] 13 cmH20   Intake/Output Summary (Last 24 hours) at 09/11/2019 0834 Last data filed at 09/11/2019 0654 Gross per 24 hour  Intake 2762.04 ml  Output 505 ml  Net 2257.04 ml   Filed Weights   09/09/19 1700 09/10/19 0051 09/10/19 0408  Weight: 61.3 kg 56.3 kg 56.3 kg   Examination: General: 68 year old female who would not heavily sedated is agitated HEENT: Endotracheal tube is in place gastric tube is in place noted to have one episode of vomiting Neuro: Does not follow commands.  Agitated intermittently CV: Heart sounds regular regular rate and rhythm PULM: Rhonchi with decreased breath sounds in the bases GI: Abdomen nondistended faint bowel sounds tube feeds currently on hold for one episode of vomiting Extremities: warm/dry negative edema  Skin: no rashes or lesions    Resolved Hospital Problem list     Assessment & Plan:   Acute encephalopathy Questionable pres MRI 09/10/2019 with T2/flair signal involving the cerebellum bilaterally and left of settable pole.  Could reflect press.  P:  Neurology input greatly appreciated Attempt to keep off sedation      Acute respiratory insufficiency Possible aspiration   P:  Vent bundle Wean FiO2 as tolerated Daily wake-up assessment spontaneous breathing trial Stop propofol Continue empirical antimicrobial therapy at this time   Hypertensive crisis -prior TTE 12/2018 mild HFpEF, EF 55-60% P:  Goal blood pressures less than 180 .  Labetalol If needed Cleviprex to maintain systolic pressure less than 180 Currently off oral antihypertensive     SIRS/sepsis  Leukocytosis P:  Continue to monitor culture data ,no positive culture data as of 09/11/2019 Continue full course of antimicrobial and antiviral therapy until  culture data is come back Follow procalcitonin's       AKI on CKD - previous sCr 1.07 AMGA/ lactic acidosis - likely type B, on metformin at home +/- type A w/ possible sepsis Lab Results  Component Value Date   CREATININE 2.03 (H) 09/10/2019   CREATININE 2.08 (H) 09/10/2019   CREATININE 1.80 (H) 09/10/2019   CREATININE 0.97 06/16/2015   CREATININE 0.87 03/18/2014   CREATININE 0.84 05/20/2013   Recent Labs  Lab 09/10/19 0041 09/10/19 0404 09/10/19 0835  K 4.3 4.1 4.1    P:  Currently on normal saline at 75 Continue to monitor creatinine Monitor replete electrolytes as needed    DM, poorly controlled Hyperglycemia CBG (last 3)  Recent Labs    09/11/19 0005 09/11/19 0337 09/11/19 0647  GLUCAP 229* 277* 353*     P:  Insulin per protocol Levemir on hold due to vomiting Sliding scale insulin changed to resistance on 09/11/2019 D5-1/2 saline at 75 changed to normal saline Tube feedings currently on hold for vomiting x1.  Levemir is on hold.  KUB of the abdomen to confirm tube placement and to check for ileus.   Diarrhea CT showing mild diffuse colitis and Cholelithiasis.   LFTs mildly elevated  09/08/2019 ultrasound right upper quadrant shows gallbladder sludge with probable small nonshadowing calculi.  No acute evidence of  cholecystitis P:  Continue to monitor LFTs intermittently No further bowel movements therefore enteric precautions lifted on 09/11/2019 Lactic acid is trending down and was normal 1.9 on 09/10/2019    Best practice:  Diet: NPO/nutrition consult 09/07/2021 initiate tube feeding/09/11/2019 vomited x1 tube feeding on hold until KUB done Pain/Anxiety/Delirium protocol (if indicated): prn VAP protocol (if indicated): yes DVT prophylaxis: heparin SQ GI prophylaxis: PPI Glucose control: hourly/ insulin gtt Mobility: BR Code Status: full  Family Communication: Unable.  Attempted to call daughter, Chanell, and home number several times without  answer.  Reportedly patient's husband is disabled and patient appears to be care provider.   Disposition: ICU  Labs   CBC: Recent Labs  Lab 09/09/19 1636  09/09/19 2125 09/09/19 2357 09/10/19 0041 09/10/19 0404 09/11/19 0650  WBC 14.4*  --   --   --   --  14.7* 13.9*  NEUTROABS 13.1*  --   --   --   --   --  PENDING  HGB 12.2   < > 11.2* 10.5* 11.2* 11.0* 9.1*  HCT 36.4   < > 33.0* 31.0* 33.0* 31.7* 26.5*  MCV 90.5  --   --   --   --  90.3 91.7  PLT 360  --   --   --   --  291 191   < > = values in this interval not displayed.    Basic Metabolic Panel: Recent Labs  Lab 09/09/19 1930 09/09/19 2110  09/09/19 2357 09/10/19 0032 09/10/19 0041 09/10/19 0404 09/10/19 0835 09/10/19 1640  NA 138 139   < > 143 143 143 144 141  --   K 4.7 5.1   < > 4.4 4.4 4.3 4.1 4.1  --   CL 103 105  --   --  110  --  111 112*  --   CO2 19* 18*  --   --  19*  --  20* 21*  --   GLUCOSE 532* 491*  --   --  337*  --  96 170*  --   BUN 36* 33*  --   --  31*  --  32* 36*  --   CREATININE 1.74* 1.82*  --   --  1.80*  --  2.08* 2.03*  --   CALCIUM 9.5 9.1  --   --  9.0  --  9.0 8.7*  --   MG  --   --   --   --  1.5*  --   --   --  2.2  PHOS  --   --   --   --   --   --   --   --  2.9   < > = values in this interval not displayed.   GFR: Estimated Creatinine Clearance: 23.2 mL/min (A) (by C-G formula based on SCr of 2.03 mg/dL (H)). Recent Labs  Lab 09/09/19 1636 09/09/19 1930 09/10/19 0032 09/10/19 0206 09/10/19 0404 09/10/19 0615 09/10/19 1154 09/11/19 0650  PROCALCITON  --   --  3.98  --   --   --   --   --   WBC 14.4*  --   --   --  14.7*  --   --  13.9*  LATICACIDVEN 2.6* 3.0*  --  6.0*  --  3.1* 1.9  --     Liver Function Tests: Recent Labs  Lab 09/09/19 1636 09/10/19 0404  AST 45* 62*  ALT 51* 47*  ALKPHOS 79  56  BILITOT 1.2 0.7  PROT 7.8 6.3*  ALBUMIN 4.5 3.4*   No results for input(s): LIPASE, AMYLASE in the last 168 hours. Recent Labs  Lab 09/10/19 0206  AMMONIA  22    ABG    Component Value Date/Time   PHART 7.386 09/09/2019 2357   PCO2ART 35.6 09/09/2019 2357   PO2ART 370.0 (H) 09/09/2019 2357   HCO3 17.6 (L) 09/10/2019 0041   TCO2 18 (L) 09/10/2019 0041   ACIDBASEDEF 6.0 (H) 09/10/2019 0041   O2SAT 87.0 09/10/2019 0041     Coagulation Profile: Recent Labs  Lab 09/09/19 1636  INR 1.1    Cardiac Enzymes: Recent Labs  Lab 09/09/19 1636 09/10/19 0404  CKTOTAL 125 534*    HbA1C: Hemoglobin A1C  Date/Time Value Ref Range Status  10/08/2018 03:41 PM 7.9 (A) 4.0 - 5.6 % Final    Comment:    CBG 54  results reported to [facility] [id number] 10-08-18  05/07/2018 03:55 PM 9.1 (A) 4.0 - 5.6 % Final   HbA1c POC (<> result, manual entry)  Date/Time Value Ref Range Status  07/23/2018 02:11 PM >14.0 (A) 4.0 - 5.6 % Final   Hgb A1c MFr Bld  Date/Time Value Ref Range Status  09/09/2019 09:10 PM 10.9 (H) 4.8 - 5.6 % Final    Comment:    (NOTE) Pre diabetes:          5.7%-6.4% Diabetes:              >6.4% Glycemic control for   <7.0% adults with diabetes   01/02/2013 05:05 PM 15.8 (H) <5.7 % Final    Comment:    (NOTE)                                                                       According to the ADA Clinical Practice Recommendations for 2011, when HbA1c is used as a screening test:  >=6.5%   Diagnostic of Diabetes Mellitus           (if abnormal result is confirmed) 5.7-6.4%   Increased risk of developing Diabetes Mellitus References:Diagnosis and Classification of Diabetes Mellitus,Diabetes Care,2011,34(Suppl 1):S62-S69 and Standards of Medical Care in         Diabetes - 2011,Diabetes Care,2011,34 (Suppl 1):S11-S61.    CBG: Recent Labs  Lab 09/10/19 1554 09/10/19 2006 09/11/19 0005 09/11/19 0337 09/11/19 0647  GLUCAP 86 160* 229* 277* 353*     App cct 36 min      Brett Canales Idus Rathke ACNP [facility] PCCM Pager [phone] till 1 pm If no answer page [phone] 09/11/2019, 8:34 AM

## 2019-09-11 NOTE — Progress Notes (Addendum)
NEUROLOGY PROGRESS NOTE  Subjective: Patient is intubated breathing over the vent.  Withdrawing from pain.  Not following any commands.  Hooked up to LTM  Exam: Vitals:   09/11/19 0400 09/11/19 0809  BP: (!) 161/75   Pulse: 81   Resp: 18   Temp:    SpO2: 100% 100%    Physical Exam  HEENT-  Normocephalic, no lesions, without obvious abnormality.  Normal external eye and conjunctiva.  Musculoskeletal-no deformity or swelling Skin-warm and dry, no hyperpigmentation, vitiligo, or suspicious lesions  Neuro:  Mental Status: Patient does not respond to verbal stimuli.  Does not respond to deep sternal rub.  Does not follow commands.  No verbalizations noted.  Intubated breathing over the vent Cranial Nerves: II: patient does not respond confrontation bilaterally,  III,IV,VI: doll's response present bilaterally. pupils right 2 mm, left 2 mm,and reactive bilaterally V,VII: corneal reflex present bilaterally  VIII: patient does not respond to verbal stimuli IX,X: gag reflex present, XI: trapezius strength unable to test bilaterally XII: tongue strength unable to test Motor: Moving all extremities antigravity with good strength-fighting restraints-however to noxious stimuli has weak withdrawal Sensory: As above and also noted to noxious stimuli she does furrow her forehead Deep Tendon Reflexes:  2+ in the upper extremities and knee jerk Plantars: downgoing bilaterally Cerebellar: Unable to perform    Medications:  Scheduled: . chlorhexidine gluconate (MEDLINE KIT)  15 mL Mouth Rinse BID  . Chlorhexidine Gluconate Cloth  6 each Topical Q0600  . heparin  5,000 Units Subcutaneous Q8H  . insulin aspart  0-15 Units Subcutaneous Q4H  . mouth rinse  15 mL Mouth Rinse 10 times per day  . pantoprazole (PROTONIX) IV  40 mg Intravenous Daily   Continuous: . sodium chloride 10 mL/hr at 09/10/19 1100  . sodium chloride 75 mL/hr at 09/11/19 0501  . acyclovir 500 mg (09/11/19 0536)  .  ceFEPime (MAXIPIME) IV Stopped (09/10/19 1803)  . clevidipine Stopped (09/10/19 0910)  . feeding supplement (VITAL AF 1.2 CAL) 65 mL/hr at 09/10/19 1800  . insulin 2.9 Units/hr (09/11/19 0654)  . vancomycin Stopped (09/10/19 1416)   FIE:PPIRJJ chloride, fentaNYL (SUBLIMAZE) injection, fentaNYL (SUBLIMAZE) injection, labetalol  Pertinent Labs/Diagnostics: WBC 13.9 Glucose 353 LTM still running with no seizure activity  EEG:  EEG Abnormalities:1)triphasic waves. 2)generalized irregular slow activity. 3)absent PDR Clinical Interpretation: ThisEEG is consistent with a generalized nonspecific cerebral dysfunction (encephalopathy). There was no seizure or seizure predisposition recorded on this study.  LTM EEG preliminary report from Tuesday night: LTM reviewed till 4. Showed generalized triphasic waves, maximal bifrontal and continuous generalized 2-_0   theta-delta activity. No seizures were seen.  Mr Brain Wo Contrast Result Date: 09/10/2019 CLINICAL DATA:  Initial evaluation for acute encephalopathy, unclear cause. EXAM: MRI HEAD WITHOUT CONTRAST TECHNIQUE: . IMPRESSION: 1. Patchy multifocal T2/FLAIR signal abnormality involving the cerebellum bilaterally and left occipital pole. Findings are nonspecific, but could reflect sequelae of PRES. Nonspecific toxic metabolic derangement would be the primary differential consideration. Correlation with history and laboratory values recommended. 2. No other acute intracranial abnormality. Electronically Signed   By: Jeannine Boga M.D.   On: 09/10/2019 03:59    Etta Quill PA-C [facility] [phone]  Impression:68 year old female presenting after being found down and unresponsive. Hyperglycemic on initial ED assessment. Obtunded on initial Neurology assessment. Today's assessment showed intact dolls reflex, corneal reflex, pupillary reaction, gag and moving all extremities due to agitation - also wrinkling forehead to  noxious stimuli.  Patient's glucose has improved into the  300s. 1. Her exam was concerning for possible brainstem pathology, but CTA of head and neck was negative for basilar occlusion, with atherosclerotic disease noted.  2. Most likely this represents some form of severe toxic/metabolic encephalopathy with structural brain damage based on clinical features and MRI findings. MRI brain was reviewed with Dr. Leonel Ramsay and there is strikingly symmetric abnormal T2 hyperinteinsity diffusely within the basal ganglia and thalami, in addition to the small supratentorial and infratentorial focal lesions that are more prominently seen in terms of signal intensity. Possible etiologies would be diabetic ketoacidosis, hepatic encephalopathy (unlikely given only mildly elevated transaminases and normal ammonia), drug overdose-need UDS, hypoglycemic brain injury and CO poisoning. The latter two are felt to be the most likely components of the DDx.  3. Ammonia and TSH came back normal.  4. LP results with mildly elevated WBC of 12 and protein of 104. The CSF findings in conjunction with MRI abnormalities most likely reflect diffuse neuronal damage. Although there is a neutrophilic predominance of the WBCs, the total number of WBC per microliter is felt to be significantly lower than would be expected from an infection and again is felt to be due to an acute response to diffuse neuronal damage.   Recommendations: 1. LTM EEG will be DC'd today secondary to showing no seizures. Triphasic waves and diffuse slowing are noted.  2. Unable to obtain follow up MRI brain with contrast due to low eGFR.  3. Continue to monitor closely  Addendum: Official report conclusions for LTM EEG: This study issuggestive of severe diffuse encephalopathy, possibly due to toxic metabolic etiology.No seizures or epileptiform discharges were seen throughout the recording.  Electronically signed: Dr. Kerney Elbe 09/11/2019, 8:51  AM

## 2019-09-11 NOTE — Progress Notes (Signed)
LTM EEG discontinued - no skin breakdown at unhook.   

## 2019-09-12 ENCOUNTER — Inpatient Hospital Stay (HOSPITAL_COMMUNITY): Payer: Medicare Other

## 2019-09-12 LAB — BASIC METABOLIC PANEL
Anion gap: 8 (ref 5–15)
BUN: 40 mg/dL — ABNORMAL HIGH (ref 8–23)
CO2: 20 mmol/L — ABNORMAL LOW (ref 22–32)
Calcium: 7.4 mg/dL — ABNORMAL LOW (ref 8.9–10.3)
Chloride: 120 mmol/L — ABNORMAL HIGH (ref 98–111)
Creatinine, Ser: 1.71 mg/dL — ABNORMAL HIGH (ref 0.44–1.00)
GFR calc Af Amer: 35 mL/min — ABNORMAL LOW (ref >60)
GFR calc non Af Amer: 30 mL/min — ABNORMAL LOW (ref >60)
Glucose, Bld: 101 mg/dL — ABNORMAL HIGH (ref 70–99)
Potassium: 3 mmol/L — ABNORMAL LOW (ref 3.5–5.1)
Sodium: 148 mmol/L — ABNORMAL HIGH (ref 135–145)

## 2019-09-12 LAB — GLUCOSE, CAPILLARY
Glucose-Capillary: 111 mg/dL — ABNORMAL HIGH (ref 70–99)
Glucose-Capillary: 120 mg/dL — ABNORMAL HIGH (ref 70–99)
Glucose-Capillary: 130 mg/dL — ABNORMAL HIGH (ref 70–99)
Glucose-Capillary: 138 mg/dL — ABNORMAL HIGH (ref 70–99)
Glucose-Capillary: 143 mg/dL — ABNORMAL HIGH (ref 70–99)
Glucose-Capillary: 96 mg/dL (ref 70–99)

## 2019-09-12 LAB — CBC WITH DIFFERENTIAL/PLATELET
Abs Immature Granulocytes: 0.12 10*3/uL — ABNORMAL HIGH (ref 0.00–0.07)
Basophils Absolute: 0 10*3/uL (ref 0.0–0.1)
Basophils Relative: 0 %
Eosinophils Absolute: 0 10*3/uL (ref 0.0–0.5)
Eosinophils Relative: 0 %
HCT: 23.3 % — ABNORMAL LOW (ref 36.0–46.0)
Hemoglobin: 8 g/dL — ABNORMAL LOW (ref 12.0–15.0)
Immature Granulocytes: 1 %
Lymphocytes Relative: 10 %
Lymphs Abs: 1.1 10*3/uL (ref 0.7–4.0)
MCH: 31.4 pg (ref 26.0–34.0)
MCHC: 34.3 g/dL (ref 30.0–36.0)
MCV: 91.4 fL (ref 80.0–100.0)
Monocytes Absolute: 0.7 10*3/uL (ref 0.1–1.0)
Monocytes Relative: 7 %
Neutro Abs: 8.6 10*3/uL — ABNORMAL HIGH (ref 1.7–7.7)
Neutrophils Relative %: 82 %
Platelets: 156 10*3/uL (ref 150–400)
RBC: 2.55 MIL/uL — ABNORMAL LOW (ref 3.87–5.11)
RDW: 12.5 % (ref 11.5–15.5)
WBC: 10.6 10*3/uL — ABNORMAL HIGH (ref 4.0–10.5)
nRBC: 0 % (ref 0.0–0.2)

## 2019-09-12 LAB — CK: Total CK: 1454 U/L — ABNORMAL HIGH (ref 38–234)

## 2019-09-12 LAB — MAGNESIUM: Magnesium: 2.3 mg/dL (ref 1.7–2.4)

## 2019-09-12 LAB — PHOSPHORUS: Phosphorus: 3.2 mg/dL (ref 2.5–4.6)

## 2019-09-12 MED ORDER — HYDRALAZINE HCL 20 MG/ML IJ SOLN
10.0000 mg | INTRAMUSCULAR | Status: DC | PRN
  Filled 2019-09-12: qty 0.5

## 2019-09-12 MED ORDER — PANTOPRAZOLE SODIUM 40 MG PO PACK: 40.0000 mg | PACK | Freq: Every day | ORAL | Status: DC

## 2019-09-12 MED ORDER — PANTOPRAZOLE SODIUM 40 MG IV SOLR
40.0000 mg | Freq: Every day | INTRAVENOUS | Status: DC
  Administered 2019-09-12 – 2019-09-17 (×6): 40 mg via INTRAVENOUS
  Filled 2019-09-12 (×6): qty 40

## 2019-09-12 MED ORDER — ONDANSETRON HCL 4 MG/2ML IJ SOLN: 4.0000 mg | Freq: Four times a day (QID) | INTRAMUSCULAR | Status: DC | PRN

## 2019-09-12 MED ORDER — POTASSIUM CHLORIDE 20 MEQ/15ML (10%) PO SOLN
40.0000 meq | ORAL | Status: DC
  Administered 2019-09-12: 40 meq
  Filled 2019-09-12: qty 30

## 2019-09-12 MED ORDER — POTASSIUM CHLORIDE 10 MEQ/100ML IV SOLN
10.0000 meq | INTRAVENOUS | Status: AC
  Administered 2019-09-12 (×4): 10 meq via INTRAVENOUS
  Filled 2019-09-12 (×4): qty 100

## 2019-09-12 MED ORDER — SODIUM CHLORIDE 0.45 % IV SOLN
INTRAVENOUS | Status: DC
  Administered 2019-09-12: 09:00:00 via INTRAVENOUS
  Administered 2019-09-12: 900 mL via INTRAVENOUS
  Administered 2019-09-13 – 2019-09-14 (×3): via INTRAVENOUS

## 2019-09-12 NOTE — Progress Notes (Signed)
NAME:  Amanda [name], MRN:  [id number], DOB:  January 04, 1951, LOS: 3 ADMISSION DATE:  09/09/2019, CONSULTATION DATE:  09/09/2019 REFERRING MD:  Dr. Rex Kras, CHIEF COMPLAINT:  Acute encephalopathy  Brief History   68 year old female presenting from home with acute encephalopathy.  No clear LSW.  Remained hypertensive, hyperglycemic, febrile, and ongoing unresponsiveness requiring intubation for airway protection.  CT/ CTA neg.   Neurology consulted, PCCM to admit.  History of present illness   HPI obtained from medical chart review as patient is sedated on mechanical ventilation and no family able to be reached.   68 year old female with history of diabetes, HTN, and neuropathy presenting from home with altered mental status.  EMS had reported that patient was found alert with eyes open, not following commands, and left arm contracted in at the base of her disabled husband bed covered in feces.  Husband not ale to provide any insight on scene or last seen well. Neighbors had reported that her car door had been opened since last night and front door found open.  Found to glucose reading high.  In ER, patient febrile 101.9 rectal, hypertensive 207/104, and tachycardic.  CT head and CTA head and neck without acute findings.  Labs noted for  Na 139, CO2 21, glucose 522, BUN 36, sCr 1.87, CK 125, LA 2.6->3, WBC 14.4, normal coags, UA with high glucose, 5 ketones, >300 protein.  She required intubation for airway protection after some desaturation episodes in the 80's.  Neurology consulted.  Cultures sent and started on empiric vancomycin, cefepime, and flagyl and ceftriaxone add for meningeal coverage.  30 cc/kg fluid bolus given.  EEG showing negative for seizure activity, showing encephalopathy.  LP pending.  PCCM to admit.   Past Medical History  Never smoker, diabetes, neuropathy, HTN, [facility]   09/11/2019 vomited x1  Consults:  Neurology  Procedures:  9/21 ETT >>  9/21 LP   Significant Diagnostic Tests:  9/21 [facility] >>neg 9/21 [facility]st/  abd >> 1. Respiratory motion artifact versus less likely developing infiltrate in the left lower lobe. Clinical correlation is recommended. 2. Artifact versus mild colitis. Clinical correlation is recommended. No bowel obstruction. 3. Cholelithiasis. Ultrasound is recommended for better evaluation of the gallbladder. Aortic Atherosclerosis   9/21 CTA head/ neck >> 1. 60% diameter stenosis proximal right internal carotid artery due to heavily calcified plaque. Moderate stenosis right cavernous carotid. 2. Mild atherosclerotic calcification left carotid bulb without stenosis. Moderate stenosis left cavernous carotid due to calcific stenosis. 3. Mild stenosis of the vertebral artery bilaterally at the skull base otherwise clear 4. No intracranial large vessel occlusion. Mild stenosis right M1 segment. 5. 6 x 10 mm ossification ligamentum flavum on the right at C6-7 may represent meningioma.  9/21 EGG >>generalized nonspecific cerebral dysfunction (encephalopathy). There was no seizure or seizure predisposition recorded on this study. 9/21 LP>> clear opening pressure 14 white count 12>> 09/08/2019 MRI of the brain>> no acute abnormality show findings T2/flair signal abnormality which could be pres.   Micro Data:  9/21 SARS CoV2 >> negative  9/21 BCx 2 >> 9/21 CSF >> white count 12 9/ 21 2020 C. Difficile>> no bowel movements  Antimicrobials:  9/21 flagyl off 9/21 vanc >> 09/12/2019 9/21 cefepime >> 9/21 ceftriaxone off 09/10/2019 acyclovir>>  Interim history/subjective:  Currently on propofol which have asked the nurses to wean.  Not following any commands.  MRI brain shows patchy multifocal T2/flair signal with questionable findings with specific  PRES  Objective   Blood pressure 135/74, pulse (!) 59, temperature 98.9 F (37.2 C), temperature source Axillary, resp. rate 18, height 5\' 4"  (1.626 m), weight 55.8 kg, SpO2 100  %.    Vent Mode: PRVC FiO2 (%):  [40 %] 40 % Set Rate:  [18 bmp] 18 bmp Vt Set:  [440 mL] 440 mL PEEP:  [5 cmH20] 5 cmH20 Plateau Pressure:  [11 cmH20-16 cmH20] 14 cmH20   Intake/Output Summary (Last 24 hours) at 09/12/2019 0827 Last data filed at 09/12/2019 8242 Gross per 24 hour  Intake 3310.17 ml  Output 785 ml  Net 2525.17 ml   Filed Weights   09/10/19 0051 09/10/19 0408 09/12/19 0312  Weight: 56.3 kg 56.3 kg 55.8 kg   Examination:  General: Elderly female who is more awake HEENT: Endotracheal tube is in place orogastric tube is in place currently no low wall intermittent suction Neuro: Much more awake interacts follows commands intermittently CV: Heart sounds are regular currently in sinus rhythm 62 PULM: Coarse rhonchi decreased in the bases GI: Abdomen soft, nontender faint bowel sounds Extremities: warm/dry, 1+ edema  Skin: no rashes or lesions    Resolved Hospital Problem list     Assessment & Plan:   Acute encephalopathy Questionable pres MRI 09/10/2019 with T2/flair signal involving the cerebellum bilaterally and left of settable pole.  Could reflect press.  P:  Minimize sedation Neurology's input appreciated Continue with Precedex for now monitor heart rate may need to change to Klonopin wafers for sedation      Acute respiratory insufficiency Possible aspiration   P:  Vent bundle\ Wean FiO2 as tolerated She is more awake and she is off propofol Not tolerating weaning protocol at this time Continue empirical antimicrobial therapy     Hypertensive crisis -prior TTE 12/2018 mild HFpEF, EF 55-60% P:  Goal blood pressure less than 180 PRN labetalol      SIRS/sepsis  Leukocytosis P:  Continue to monitor culture data ,no positive culture data as of 09/11/2019 Continue full course of antimicrobial and antiviral therapy until culture data is come back Procalcitonin on 09/11/2019 went from 3-6.48       AKI on CKD - previous sCr 1.07  AMGA/ lactic acidosis - likely type B, on metformin at home +/- type A w/ possible sepsis Lab Results  Component Value Date   CREATININE 1.71 (H) 09/12/2019   CREATININE 2.25 (H) 09/11/2019   CREATININE 2.03 (H) 09/10/2019   CREATININE 0.97 06/16/2015   CREATININE 0.87 03/18/2014   CREATININE 0.84 05/20/2013   Recent Labs  Lab 09/10/19 0835 09/11/19 0650 09/12/19 0434  K 4.1 3.8 3.0*    P:  Currently on half-normal saline at 125 Continue to monitor creatinine Monitor replete electrolytes as needed    DM, poorly controlled Hyperglycemia CBG (last 3)  Recent Labs    09/11/19 1929 09/11/19 2321 09/12/19 0310  GLUCAP 155* 139* 96     P:  Sliding scale insulin protocol Off Levemir due to vomiting and tube feedings on hold Continue to monitor   Diarrhea/vomiting/elevated CK CT showing mild diffuse colitis and Cholelithiasis.   LFTs mildly elevated  09/08/2019 ultrasound right upper quadrant shows gallbladder sludge with probable small nonshadowing calculi.  No acute evidence of cholecystitis P:  LFTs improved Tube feedings are on hold Antiemetic ordered Half-normal saline at 125 cc an hour Continue to monitor CKs    Best practice:  Diet: NPO/nutrition consult 09/07/2021 initiate tube feeding/09/11/2019 vomited x1 tube feeding on hold until  KUB done Pain/Anxiety/Delirium protocol (if indicated): prn VAP protocol (if indicated): yes DVT prophylaxis: heparin SQ GI prophylaxis: PPI Glucose control: hourly/ insulin gtt Mobility: BR Code Status: full  Family Communication: Daughter Chanel updated 09/12/2019.  She communicates daily with nurses for updates.  Labs   CBC: Recent Labs  Lab 09/09/19 1636  09/09/19 2357 09/10/19 0041 09/10/19 0404 09/11/19 0650 09/12/19 0434  WBC 14.4*  --   --   --  14.7* 13.9* 10.6*  NEUTROABS 13.1*  --   --   --   --  12.4* 8.6*  HGB 12.2   < > 10.5* 11.2* 11.0* 9.1* 8.0*  HCT 36.4   < > 31.0* 33.0* 31.7* 26.5* 23.3*   MCV 90.5  --   --   --  90.3 91.7 91.4  PLT 360  --   --   --  291 191 156   < > = values in this interval not displayed.    Basic Metabolic Panel: Recent Labs  Lab 09/10/19 0032 09/10/19 0041 09/10/19 0404 09/10/19 0835 09/10/19 1640 09/11/19 0650 09/11/19 1620 09/12/19 0434  NA 143 143 144 141  --  139  --  148*  K 4.4 4.3 4.1 4.1  --  3.8  --  3.0*  CL 110  --  111 112*  --  111  --  120*  CO2 19*  --  20* 21*  --  19*  --  20*  GLUCOSE 337*  --  96 170*  --  374*  --  101*  BUN 31*  --  32* 36*  --  51*  --  40*  CREATININE 1.80*  --  2.08* 2.03*  --  2.25*  --  1.71*  CALCIUM 9.0  --  9.0 8.7*  --  8.1*  --  7.4*  MG 1.5*  --   --   --  2.2 2.4 2.5* 2.3  PHOS  --   --   --   --  2.9 3.2 3.1 3.2   GFR: Estimated Creatinine Clearance: 27.6 mL/min (A) (by C-G formula based on SCr of 1.71 mg/dL (H)). Recent Labs  Lab 09/09/19 1636 09/09/19 1930 09/10/19 0032 09/10/19 0206 09/10/19 0404 09/10/19 0615 09/10/19 1154 09/11/19 0650 09/12/19 0434  PROCALCITON  --   --  3.98  --   --   --   --  6.48  --   WBC 14.4*  --   --   --  14.7*  --   --  13.9* 10.6*  LATICACIDVEN 2.6* 3.0*  --  6.0*  --  3.1* 1.9  --   --     Liver Function Tests: Recent Labs  Lab 09/09/19 1636 09/10/19 0404  AST 45* 62*  ALT 51* 47*  ALKPHOS 79 56  BILITOT 1.2 0.7  PROT 7.8 6.3*  ALBUMIN 4.5 3.4*   No results for input(s): LIPASE, AMYLASE in the last 168 hours. Recent Labs  Lab 09/10/19 0206  AMMONIA 22    ABG    Component Value Date/Time   PHART 7.386 09/09/2019 2357   PCO2ART 35.6 09/09/2019 2357   PO2ART 370.0 (H) 09/09/2019 2357   HCO3 17.6 (L) 09/10/2019 0041   TCO2 18 (L) 09/10/2019 0041   ACIDBASEDEF 6.0 (H) 09/10/2019 0041   O2SAT 87.0 09/10/2019 0041     Coagulation Profile: Recent Labs  Lab 09/09/19 1636  INR 1.1    Cardiac Enzymes: Recent Labs  Lab 09/09/19 1636 09/10/19 0404 09/11/19 3403  CKTOTAL 125 534* 2,271*    HbA1C: Hemoglobin A1C   Date/Time Value Ref Range Status  10/08/2018 03:41 PM 7.9 (A) 4.0 - 5.6 % Final    Comment:    CBG 54  results reported to [place] 1528p 10-08-18  05/07/2018 03:55 PM 9.1 (A) 4.0 - 5.6 % Final   HbA1c POC (<> result, manual entry)  Date/Time Value Ref Range Status  07/23/2018 02:11 PM >14.0 (A) 4.0 - 5.6 % Final   Hgb A1c MFr Bld  Date/Time Value Ref Range Status  09/09/2019 09:10 PM 10.9 (H) 4.8 - 5.6 % Final    Comment:    (NOTE) Pre diabetes:          5.7%-6.4% Diabetes:              >6.4% Glycemic control for   <7.0% adults with diabetes   01/02/2013 05:05 PM 15.8 (H) <5.7 % Final    Comment:    (NOTE)                                                                       According to the ADA Clinical Practice Recommendations for 2011, when HbA1c is used as a screening test:  >=6.5%   Diagnostic of Diabetes Mellitus           (if abnormal result is confirmed) 5.7-6.4%   Increased risk of developing Diabetes Mellitus References:Diagnosis and Classification of Diabetes Mellitus,Diabetes MVEH,2094,70(JGGEZ 1):S62-S69 and Standards of Medical Care in         Diabetes - 2011,Diabetes Care,2011,34 (Suppl 1):S11-S61.    CBG: Recent Labs  Lab 09/11/19 1237 09/11/19 1545 09/11/19 1929 09/11/19 2321 09/12/19 0310  GLUCAP 134* 211* 155* 139* 96     App cct 36 min      Richardson Landry Minor ACNP Mary[facility] Shape PCCM Pager [phone] till 1 pm If no answer page [phone] 09/12/2019, 8:27 AM

## 2019-09-12 NOTE — Progress Notes (Signed)
Initial Nutrition Assessment  DOCUMENTATION CODES:   Not applicable  INTERVENTION:   Recommend considering addition of pro-motility agent such as reglan or erythromycin  Consider post-pyloric Cortrak placement if gastric feedings continue to be poorly tolerated  Tube Feeding:  If pt not extubated over the next 24 hours, recommend trial of trickle TF of Vital AF 1.2 at 20 ml/hr Goal: Vital AF at 1.2 at 45 ml/hr Pro-Stat 30 mL daily Provides 96 g of protein, 1396 kcals and 875 mL of free water Meets 100% estimated calorie and protein neeeds   NUTRITION DIAGNOSIS:   Inadequate oral intake related to acute illness as evidenced by NPO status.  Continues  GOAL:   Patient will meet greater than or equal to 90% of their needs  Not Met  MONITOR:   Vent status, TF tolerance, Labs, Weight trends, Skin  REASON FOR ASSESSMENT:   Consult Assessment of nutrition requirement/status, Enteral/tube feeding initiation and management  ASSESSMENT:   68 yo female AMS with suspected PRES, HTN emergency, acute respiratory failure requiring intubation, AKI with rhabdomyolysis.PMH includes HTN, DM, normocytic anemia, dyslipidemia, MDD  9/21 Admit, Intubated, CT abdomen with cholelithiasis, possible mild colitis 9/23 Abdominal xray with OG tube in stomach, normal bowel gas pattern  Patient is currently intubated on ventilator support MV: 8 L/min Temp (24hrs), Avg:98.2 F (36.8 C), Min:97 F (36.1 C), Max:98.9 F (37.2 C)  Per RN, pt vomited on 9/22. TF restarted at 35 ml/hr (half rate) and pt vomited again. TF remains on hold  Abdominal xray negative for acute process; no BM since admission. Noted possible mild colitis, cholelithiasis per CT scan. Given pt with hx of poorly controlled DM diagnosed in 1994,  pt may have gastroparesis contributing to delayed gastric emptying and vomiting. Per outpatient RD note from 12/2018, GI mentions pt with diabetic gastropathy  Labs: sodium 148 (H),  Creatinine 1.71, BUN 40, potassium 3.0 (L), CBgs 96-155 Meds: reviewed    Diet Order:   Diet Order            Diet NPO time specified  Diet effective now              EDUCATION NEEDS:   Education needs have been addressed  Skin:  Skin Assessment: Reviewed RN Assessment  Last BM:  9/22  Height:   Ht Readings from Last 1 Encounters:  09/09/19 '5\' 4"'  (1.626 m)    Weight:   Wt Readings from Last 1 Encounters:  09/12/19 55.8 kg    Ideal Body Weight:  54.5 kg  BMI:  Body mass index is 21.12 kg/m.  Estimated Nutritional Needs:   Kcal:  1957 kcals  Protein:  112-140 g  Fluid:  >/= 1.9 L    Cate Danne Scardina MS, RDN, LDN, CNSC [phone] Pager  [phone] Weekend/On-Call Pager

## 2019-09-13 ENCOUNTER — Inpatient Hospital Stay (HOSPITAL_COMMUNITY): Payer: Medicare Other

## 2019-09-13 DIAGNOSIS — J9600 Acute respiratory failure, unspecified whether with hypoxia or hypercapnia: Secondary | ICD-10-CM

## 2019-09-13 DIAGNOSIS — N179 Acute kidney failure, unspecified: Secondary | ICD-10-CM

## 2019-09-13 LAB — BASIC METABOLIC PANEL
Anion gap: 11 (ref 5–15)
BUN: 41 mg/dL — ABNORMAL HIGH (ref 8–23)
CO2: 17 mmol/L — ABNORMAL LOW (ref 22–32)
Calcium: 8.3 mg/dL — ABNORMAL LOW (ref 8.9–10.3)
Chloride: 115 mmol/L — ABNORMAL HIGH (ref 98–111)
Creatinine, Ser: 1.61 mg/dL — ABNORMAL HIGH (ref 0.44–1.00)
GFR calc Af Amer: 38 mL/min — ABNORMAL LOW (ref >60)
GFR calc non Af Amer: 33 mL/min — ABNORMAL LOW (ref >60)
Glucose, Bld: 150 mg/dL — ABNORMAL HIGH (ref 70–99)
Potassium: 4.6 mmol/L (ref 3.5–5.1)
Sodium: 143 mmol/L (ref 135–145)

## 2019-09-13 LAB — CSF CULTURE W GRAM STAIN: Culture: NO GROWTH

## 2019-09-13 LAB — MAGNESIUM: Magnesium: 2.4 mg/dL (ref 1.7–2.4)

## 2019-09-13 LAB — CBC WITH DIFFERENTIAL/PLATELET
Abs Immature Granulocytes: 0.07 10*3/uL (ref 0.00–0.07)
Basophils Absolute: 0 10*3/uL (ref 0.0–0.1)
Basophils Relative: 0 %
Eosinophils Absolute: 0 10*3/uL (ref 0.0–0.5)
Eosinophils Relative: 0 %
HCT: 29.8 % — ABNORMAL LOW (ref 36.0–46.0)
Hemoglobin: 9.9 g/dL — ABNORMAL LOW (ref 12.0–15.0)
Immature Granulocytes: 1 %
Lymphocytes Relative: 14 %
Lymphs Abs: 1.7 10*3/uL (ref 0.7–4.0)
MCH: 31.5 pg (ref 26.0–34.0)
MCHC: 33.2 g/dL (ref 30.0–36.0)
MCV: 94.9 fL (ref 80.0–100.0)
Monocytes Absolute: 0.8 10*3/uL (ref 0.1–1.0)
Monocytes Relative: 6 %
Neutro Abs: 9.5 10*3/uL — ABNORMAL HIGH (ref 1.7–7.7)
Neutrophils Relative %: 79 %
Platelets: 181 10*3/uL (ref 150–400)
RBC: 3.14 MIL/uL — ABNORMAL LOW (ref 3.87–5.11)
RDW: 12.9 % (ref 11.5–15.5)
WBC: 12.1 10*3/uL — ABNORMAL HIGH (ref 4.0–10.5)
nRBC: 0 % (ref 0.0–0.2)

## 2019-09-13 LAB — PHOSPHORUS: Phosphorus: 3.6 mg/dL (ref 2.5–4.6)

## 2019-09-13 LAB — TRIGLYCERIDES: Triglycerides: 115 mg/dL (ref <150)

## 2019-09-13 LAB — CK: Total CK: 655 U/L — ABNORMAL HIGH (ref 38–234)

## 2019-09-13 LAB — GLUCOSE, CAPILLARY
Glucose-Capillary: 119 mg/dL — ABNORMAL HIGH (ref 70–99)
Glucose-Capillary: 142 mg/dL — ABNORMAL HIGH (ref 70–99)
Glucose-Capillary: 145 mg/dL — ABNORMAL HIGH (ref 70–99)
Glucose-Capillary: 160 mg/dL — ABNORMAL HIGH (ref 70–99)
Glucose-Capillary: 142 mg/dL — ABNORMAL HIGH (ref 70–99)

## 2019-09-13 LAB — ALBUMIN: Albumin: 2.5 g/dL — ABNORMAL LOW (ref 3.5–5.0)

## 2019-09-13 MED ORDER — HYDRALAZINE HCL 25 MG PO TABS
25.0000 mg | ORAL_TABLET | ORAL | Status: DC | PRN
  Administered 2019-09-13: 25 mg
  Filled 2019-09-13: qty 1

## 2019-09-13 MED ORDER — LABETALOL HCL 5 MG/ML IV SOLN
10.0000 mg | INTRAVENOUS | Status: DC | PRN
  Administered 2019-09-13 – 2019-09-14 (×2): 10 mg via INTRAVENOUS
  Filled 2019-09-13: qty 4

## 2019-09-13 NOTE — Progress Notes (Signed)
NAME:  Amanda [name], MRN:  [id number], DOB:  [birth year], LOS: 4 ADMISSION DATE:  09/09/2019, CONSULTATION DATE:  09/09/2019 REFERRING MD:  Dr. Rex Kras, CHIEF COMPLAINT:  Acute encephalopathy  Brief History   68 year old female presenting from home with acute encephalopathy.  No clear LSW.  Remained hypertensive, hyperglycemic, febrile, and ongoing unresponsiveness requiring intubation for airway protection.  CT/ CTA neg.   Neurology consulted, PCCM to admit.  History of present illness   HPI obtained from medical chart review as patient is sedated on mechanical ventilation and no family able to be reached.   68 year old female with history of diabetes, HTN, and neuropathy presenting from home with altered mental status.  EMS had reported that patient was found alert with eyes open, not following commands, and left arm contracted in at the base of her disabled husband bed covered in feces.  Husband not ale to provide any insight on scene or last seen well. Neighbors had reported that her car door had been opened since last night and front door found open.  Found to glucose reading high.  In ER, patient febrile 101.9 rectal, hypertensive 207/104, and tachycardic.  CT head and CTA head and neck without acute findings.  Labs noted for  Na 139, CO2 21, glucose 522, BUN 36, sCr 1.87, CK 125, LA 2.6->3, WBC 14.4, normal coags, UA with high glucose, 5 ketones, >300 protein.  She required intubation for airway protection after some desaturation episodes in the 80's.  Neurology consulted.  Cultures sent and started on empiric vancomycin, cefepime, and flagyl and ceftriaxone add for meningeal coverage.  30 cc/kg fluid bolus given.  EEG showing negative for seizure activity, showing encephalopathy.  LP pending.  PCCM to admit.   Past Medical History  Never smoker, diabetes, neuropathy, HTN, [facility] Events   09/11/2019 vomited x1  Consults:  Neurology  Procedures:  9/21 ETT >>  9/21 LP   Significant Diagnostic Tests:  9/21 [facility] >>neg 9/21 [facility]chest/  abd >> 1. Respiratory motion artifact versus less likely developing infiltrate in the left lower lobe. Clinical correlation is recommended. 2. Artifact versus mild colitis. Clinical correlation is recommended. No bowel obstruction. 3. Cholelithiasis. Ultrasound is recommended for better evaluation of the gallbladder. Aortic Atherosclerosis   9/21 CTA head/ neck >> 1. 60% diameter stenosis proximal right internal carotid artery due to heavily calcified plaque. Moderate stenosis right cavernous carotid. 2. Mild atherosclerotic calcification left carotid bulb without stenosis. Moderate stenosis left cavernous carotid due to calcific stenosis. 3. Mild stenosis of the vertebral artery bilaterally at the skull base otherwise clear 4. No intracranial large vessel occlusion. Mild stenosis right M1 segment. 5. 6 x 10 mm ossification ligamentum flavum on the right at C6-7 may represent meningioma.  9/21 EGG >>generalized nonspecific cerebral dysfunction (encephalopathy). There was no seizure or seizure predisposition recorded on this study. 9/21 LP>> clear opening pressure 14 white count 12>> 09/08/2019 MRI of the brain>> no acute abnormality show findings T2/flair signal abnormality which could be pres.   Micro Data:  9/21 SARS CoV2 >> negative  9/21 BCx 2 >> 9/21 CSF >> white count 12 9/ 21 2020 C. Difficile>> no bowel movements  Antimicrobials:  9/21 flagyl off 9/21 vanc >> 09/12/2019 9/21 cefepime >> 9/21 ceftriaxone off 09/10/2019 acyclovir>> off  Interim history/subjective:  Currently on propofol which have asked the nurses to wean.  Not following any commands.  MRI brain shows patchy multifocal T2/flair signal with questionable findings with  specific PRES  Objective   Blood pressure 139/64, pulse 61, temperature (!) 96.8 F (36 C), temperature source Axillary, resp. rate 18, height 5\' 4"  (1.626 m), weight 56.8 kg, SpO2  100 %.    Vent Mode: CPAP;PSV FiO2 (%):  [40 %] 40 % Set Rate:  [18 bmp] 18 bmp Vt Set:  [440 mL] 440 mL PEEP:  [5 cmH20] 5 cmH20 Pressure Support:  [10 cmH20] 10 cmH20 Plateau Pressure:  [12 cmH20-14 cmH20] 13 cmH20   Intake/Output Summary (Last 24 hours) at 09/13/2019 0858 Last data filed at 09/13/2019 9326 Gross per 24 hour  Intake 3219.32 ml  Output 675 ml  Net 2544.32 ml   Filed Weights   09/10/19 0408 09/12/19 0312 09/13/19 0500  Weight: 56.3 kg 55.8 kg 56.8 kg   Examination:  General: Elderly female who is much more awake today HEENT: Endotracheal tube is in place gastric tube is in place Neuro: Follows commands moves all extremities more interactive CV: Heart sounds are regular regular rate and rhythm PULM: Diminished in the bases GI: Abdomen is soft, orogastric tube is currently to low wall intermittent suction.  We will resume tube feedings at 10 cc an hour and continue to monitor Extremities: warm/dry, negative edema  Skin: no rashes or lesions     Resolved Hospital Problem list     Assessment & Plan:   Acute encephalopathy Questionable pres Questionable injury from hypoglycemia MRI 09/10/2019 with T2/flair signal involving the cerebellum bilaterally and left of settable pole.  Could reflect press.  P:  Sedation stopped 09/13/2019 Continue to monitor      Acute respiratory insufficiency Possible aspiration   P:  Hold sedation Wean per protocol, hopefully extubate 09/13/2019 Vent bundle     Hypertensive crisis -prior TTE 12/2018 mild HFpEF, EF 55-60% P:  Goal blood pressure less than 180 PRN labetalol      SIRS/sepsis  Leukocytosis P:  No positive culture data as of 09/13/2019 Antiviral therapy has been discontinued vancomycin is been discontinued currently on cefepime day 5 low threshold to discontinue all antibiotics at this time        AKI on CKD - previous sCr 1.07 AMGA/ lactic acidosis - likely type B, on metformin at home  +/- type A w/ possible sepsis Lab Results  Component Value Date   CREATININE 1.61 (H) 09/13/2019   CREATININE 1.71 (H) 09/12/2019   CREATININE 2.25 (H) 09/11/2019   CREATININE 0.97 06/16/2015   CREATININE 0.87 03/18/2014   CREATININE 0.84 05/20/2013   Recent Labs  Lab 09/11/19 0650 09/12/19 0434 09/13/19 0541  K 3.8 3.0* 4.6   Recent Labs  Lab 09/11/19 0650 09/12/19 0434 09/13/19 0541  NA 139 148* 143    P:  Continue half-normal saline at 125 Sodium is coming down slowly Potassium is been repleted Continue to monitor replete electrolytes as needed Creatinine continues to improve       DM, poorly controlled Hyperglycemia CBG (last 3)  Recent Labs    09/12/19 2352 09/13/19 0331 09/13/19 0806  GLUCAP 138* 119* 142*     P:  Continue sliding scale insulin protocol Currently off Levemir but restarting tube feedings at trickle dose may need to go back on Levemir near future   Diarrhea/vomiting/elevated CK CT showing mild diffuse colitis and Cholelithiasis.   LFTs mildly elevated  09/08/2019 ultrasound right upper quadrant shows gallbladder sludge with probable small nonshadowing calculi.  No acute evidence of cholecystitis P:  Monitor LFTs Restart tube feedings at trickle Antiemetic is  been ordered Continue to monitor CK     Best practice:  Diet: NPO/nutrition consult 09/07/2021 initiate tube feeding/09/11/2019 vomited x1 tube feeding on hold until KUB done and unremarkable.  09/13/2019 trickle tube feeds at 10 cc an hour plus PRN antibiotics Pain/Anxiety/Delirium protocol (if indicated): prn VAP protocol (if indicated): yes DVT prophylaxis: heparin SQ GI prophylaxis: PPI Glucose control: hourly/ insulin gtt Mobility: BR Code Status: full  Family Communication: Daughter Chanel updated 09/12/2019.  She communicates daily with nurses for updates.  Labs   CBC: Recent Labs  Lab 09/09/19 1636  09/10/19 0041 09/10/19 0404 09/11/19 0650 09/12/19 0434  09/13/19 0541  WBC 14.4*  --   --  14.7* 13.9* 10.6* 12.1*  NEUTROABS 13.1*  --   --   --  12.4* 8.6* 9.5*  HGB 12.2   < > 11.2* 11.0* 9.1* 8.0* 9.9*  HCT 36.4   < > 33.0* 31.7* 26.5* 23.3* 29.8*  MCV 90.5  --   --  90.3 91.7 91.4 94.9  PLT 360  --   --  291 191 156 181   < > = values in this interval not displayed.    Basic Metabolic Panel: Recent Labs  Lab 09/10/19 0404 09/10/19 0835 09/10/19 1640 09/11/19 0650 09/11/19 1620 09/12/19 0434 09/13/19 0541  NA 144 141  --  139  --  148* 143  K 4.1 4.1  --  3.8  --  3.0* 4.6  CL 111 112*  --  111  --  120* 115*  CO2 20* 21*  --  19*  --  20* 17*  GLUCOSE 96 170*  --  374*  --  101* 150*  BUN 32* 36*  --  51*  --  40* 41*  CREATININE 2.08* 2.03*  --  2.25*  --  1.71* 1.61*  CALCIUM 9.0 8.7*  --  8.1*  --  7.4* 8.3*  MG  --   --  2.2 2.4 2.5* 2.3 2.4  PHOS  --   --  2.9 3.2 3.1 3.2 3.6   GFR: Estimated Creatinine Clearance: 29.3 mL/min (A) (by C-G formula based on SCr of 1.61 mg/dL (H)). Recent Labs  Lab 09/09/19 1930 09/10/19 0032 09/10/19 0206 09/10/19 0404 09/10/19 0615 09/10/19 1154 09/11/19 0650 09/12/19 0434 09/13/19 0541  PROCALCITON  --  3.98  --   --   --   --  6.48  --   --   WBC  --   --   --  14.7*  --   --  13.9* 10.6* 12.1*  LATICACIDVEN 3.0*  --  6.0*  --  3.1* 1.9  --   --   --     Liver Function Tests: Recent Labs  Lab 09/09/19 1636 09/10/19 0404 09/13/19 0541  AST 45* 62*  --   ALT 51* 47*  --   ALKPHOS 79 56  --   BILITOT 1.2 0.7  --   PROT 7.8 6.3*  --   ALBUMIN 4.5 3.4* 2.5*   No results for input(s): LIPASE, AMYLASE in the last 168 hours. Recent Labs  Lab 09/10/19 0206  AMMONIA 22    ABG    Component Value Date/Time   PHART 7.386 09/09/2019 2357   PCO2ART 35.6 09/09/2019 2357   PO2ART 370.0 (H) 09/09/2019 2357   HCO3 17.6 (L) 09/10/2019 0041   TCO2 18 (L) 09/10/2019 0041   ACIDBASEDEF 6.0 (H) 09/10/2019 0041   O2SAT 87.0 09/10/2019 0041     Coagulation Profile:  Recent  Labs  Lab 09/09/19 1636  INR 1.1    Cardiac Enzymes: Recent Labs  Lab 09/09/19 1636 09/10/19 0404 09/11/19 0650 09/12/19 0434  CKTOTAL 125 534* 2,271* 1,454*    HbA1C: Hemoglobin A1C  Date/Time Value Ref Range Status  10/08/2018 03:41 PM 7.9 (A) 4.0 - 5.6 % Final    Comment:    CBG 54  results reported to [place] 1528p 10-08-18  05/07/2018 03:55 PM 9.1 (A) 4.0 - 5.6 % Final   HbA1c POC (<> result, manual entry)  Date/Time Value Ref Range Status  07/23/2018 02:11 PM >14.0 (A) 4.0 - 5.6 % Final   Hgb A1c MFr Bld  Date/Time Value Ref Range Status  09/09/2019 09:10 PM 10.9 (H) 4.8 - 5.6 % Final    Comment:    (NOTE) Pre diabetes:          5.7%-6.4% Diabetes:              >6.4% Glycemic control for   <7.0% adults with diabetes   01/02/2013 05:05 PM 15.8 (H) <5.7 % Final    Comment:    (NOTE)                                                                       According to the ADA Clinical Practice Recommendations for 2011, when HbA1c is used as a screening test:  >=6.5%   Diagnostic of Diabetes Mellitus           (if abnormal result is confirmed) 5.7-6.4%   Increased risk of developing Diabetes Mellitus References:Diagnosis and Classification of Diabetes Mellitus,Diabetes LMBE,6754,49(EEFEO 1):S62-S69 and Standards of Medical Care in         Diabetes - 2011,Diabetes Care,2011,34 (Suppl 1):S11-S61.    CBG: Recent Labs  Lab 09/12/19 1626 09/12/19 1956 09/12/19 2352 09/13/19 0331 09/13/19 0806  GLUCAP 143* 111* 138* 119* 142*     App cct 37 min      Richardson Landry  ACNP Maryanna Shape PCCM Pager [phone] till 1 pm If no answer page [phone] 09/13/2019, 8:58 AM

## 2019-09-13 NOTE — Progress Notes (Addendum)
Pharmacy Antibiotic Note  Amanda [name] is a 68 y.o. female admitted on 09/09/2019 with sepsis.  Pharmacy has been consulted for cefepime dosing.  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  D#5 of empiric antibiotics. Patient's AKI is improving - SCr down to 1.61, CrCL 29.3 ml/min, Tmax 102.3, WBC 12.1, LA 1.9, PCT 6.48  Plan: Cefepime 2gm IV Q24h. Low threshold to d/c per CCM. Monitor renal fxn, clinical progress, abx LOT  Height: 5\' 4"  (162.6 cm) Weight: 125 lb 3.5 oz (56.8 kg) IBW/kg (Calculated) : 54.7  Temp (24hrs), Avg:97.7 F (36.5 C), Min:96.8 F (36 C), Max:98 F (36.7 C)  Recent Labs  Lab 09/09/19 1636 09/09/19 1930  09/10/19 0206 09/10/19 0404 09/10/19 0615 09/10/19 0835 09/10/19 1154 09/11/19 0650 09/12/19 0434 09/13/19 0541  WBC 14.4*  --   --   --  14.7*  --   --   --  13.9* 10.6* 12.1*  CREATININE 1.87* 1.74*   < >  --  2.08*  --  2.03*  --  2.25* 1.71* 1.61*  LATICACIDVEN 2.6* 3.0*  --  6.0*  --  3.1*  --  1.9  --   --   --   VANCORANDOM  --   --   --   --   --   --   --  10  --   --   --    < > = values in this interval not displayed.    Estimated Creatinine Clearance: 29.3 mL/min (A) (by C-G formula based on SCr of 1.61 mg/dL (H)).    Allergies  Allergen Reactions  . Gabapentin Other (See Comments)    Pt states medication made her feel "out of it".   Pt states medication made her feel "out of it".      Vanc 9/21 >>9.24 Cefepime 9/21 >> Acyclovir 9/22 >> 9/24  9/21 covid - negative 9/21 UCx - NG  9/21 BCx x2 - Ngx3 days > pending 9/22 MRSA PCR - negative 9/22 CSF cx - Ngx3 days 9/22 LP - negative 9/23 resp panel - negative   Jonathon Bellows, [name] Candidate 09/13/2019 1:34 PM   Remigio Eisenmenger D. Mina Marble, [name], BCPS, [place] 09/13/2019, 2:33 PM

## 2019-09-13 NOTE — Evaluation (Signed)
Clinical/Bedside Swallow Evaluation Patient Details  Name: Amanda [name] MRN: [id number] Date of Birth: 10-30-1951  Today's Date: 09/13/2019 Time: SLP Start Time (ACUTE ONLY): 1400 SLP Stop Time (ACUTE ONLY): 1420 SLP Time Calculation (min) (ACUTE ONLY): 20 min  Past Medical History:  Past Medical History:  Diagnosis Date  . Allergy    SEASONAL  . Anemia   . Arthritis   . Cataract    PRESENT IN LEFT/ CATARACTS REMOVED FROM RIGHT  . Diabetes mellitus 1996  . DKA (diabetic ketoacidoses) (HCC) 01/02/2013  . Hyperlipidemia   . Hypertension   . Hypotension   . Influenza A (H1N1) 01/03/2013  . Neuromuscular disorder (HCC)    NEUROPATHY  . SYNCOPE 05/10/2010   Qualifier: Diagnosis of  By: Annamary Rummage RN, BSN, Jacquelyn     Past Surgical History:  Past Surgical History:  Procedure Laterality Date  . ABDOMINAL HYSTERECTOMY     10 years ago   . BREAST BIOPSY    . Cataracts    . ROTATOR CUFF REPAIR    . SHOULDER SURGERY     Rotator cuff tear    HPI:  Pt is a 68 yo female presenting with AMS. 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 Pt required intubation 9/21-9/25. PMH: DM, HTN, HLD   Assessment / Plan / Recommendation Clinical Impression  Pt has a hoarse vocal quality and baseline coughing s/p extubation earlier this morning. She has multiple swallows and the outward appearance of discoordination with swallowing, followed by strong, immediate coughing with thin liquids. Immediate coughing is not noted with other consistencies (solids, thickened liquids) but she does have a delayed cough that is productive of reminents of the boluses she had consumed. Recommend keeping her NPO except for meds crushed in puree and ice chips after oral care for today, allowing additional time post-extubation before starting meals but also allowing some opportunities to practice swallowing and clear her  secretions. Will f/u on next date to determine PO readiness versus need for instrumental testing. SLP Visit Diagnosis: Dysphagia, unspecified (R13.10)    Aspiration Risk  Moderate aspiration risk    Diet Recommendation NPO except meds;Ice chips PRN after oral care   Medication Administration: Crushed with puree    Other  Recommendations Oral Care Recommendations: Oral care QID;Oral care prior to ice chip/H20 Other Recommendations: Have oral suction available   Follow up Recommendations (tba)      Frequency and Duration min 2x/week  2 weeks       Prognosis Prognosis for Safe Diet Advancement: Good Barriers to Reach Goals: Cognitive deficits      Swallow Study   General HPI: Pt is a 68 yo female presenting with AMS. 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 Pt required intubation 9/21-9/25. PMH: DM, HTN, HLD Type of Study: Bedside Swallow Evaluation Previous Swallow Assessment: none in chart Diet Prior to this Study: NPO Temperature Spikes Noted: No Respiratory Status: Nasal cannula History of Recent Intubation: Yes Length of Intubations (days): 5 days Date extubated: 09/13/19 Behavior/Cognition: Alert;Cooperative;Pleasant mood;Confused Oral Cavity Assessment: Within Functional Limits Oral Care Completed by SLP: No Oral Cavity - Dentition: Adequate natural dentition Vision: Functional for self-feeding Self-Feeding Abilities: Needs assist Patient Positioning: Upright in bed Baseline Vocal Quality: Hoarse Volitional Cough: Strong Volitional Swallow: Able to elicit    Oral/Motor/Sensory Function Overall Oral Motor/Sensory  Function: Within functional limits   Ice Chips Ice chips: Within functional limits Presentation: Spoon   Thin Liquid Thin Liquid: Impaired Presentation: Cup;Self Fed;Spoon Pharyngeal  Phase Impairments: Multiple swallows;Cough - Immediate     Nectar Thick Nectar Thick Liquid: Impaired Presentation: Cup;Self Fed Pharyngeal Phase Impairments: Multiple swallows;Cough - Delayed   Honey Thick Honey Thick Liquid: Not tested   Puree Puree: Impaired Presentation: Spoon Pharyngeal Phase Impairments: Cough - Delayed   Solid     Solid: Impaired Pharyngeal Phase Impairments: Cough - Delayed      [name] 09/13/2019,3:05 PM  Ivar Drape, M.A. CCC-SLP Acute Herbalist [phone] Office [phone]

## 2019-09-13 NOTE — Progress Notes (Signed)
Pt arrived to the unit. Alert and oriented x3. VS WDL. Call bell within reach. Bed alarm is on.

## 2019-09-13 NOTE — Procedures (Signed)
Extubation Procedure Note  Patient Details:   Name: Amanda [name] DOB: 1951/06/25 MRN: [id number]   Airway Documentation:    Vent end date: 09/13/19 Vent end time: 1022   Evaluation  O2 sats: stable throughout Complications: No apparent complications Patient did tolerate procedure well. Bilateral Breath Sounds: Clear, Diminished   Patient extubated to 4L [place]. Patient met all weaning parameters prior to extubation along with positive cuff leak. Patient able to speak & cough post extubation. IS instructed 1000 x5.  Kathie Dike 09/13/2019, 10:27 AM

## 2019-09-13 NOTE — Progress Notes (Signed)
Images from the patient's MRI were personally reviewed. 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 Methanol toxicity and ethylene glycol poisoning should also be considered.    Electronically signed: Dr. Kerney Elbe

## 2019-09-14 ENCOUNTER — Inpatient Hospital Stay (HOSPITAL_COMMUNITY): Payer: Medicare Other

## 2019-09-14 DIAGNOSIS — N170 Acute kidney failure with tubular necrosis: Secondary | ICD-10-CM

## 2019-09-14 LAB — BASIC METABOLIC PANEL
Anion gap: 10 (ref 5–15)
BUN: 35 mg/dL — ABNORMAL HIGH (ref 8–23)
CO2: 16 mmol/L — ABNORMAL LOW (ref 22–32)
Calcium: 8.3 mg/dL — ABNORMAL LOW (ref 8.9–10.3)
Chloride: 117 mmol/L — ABNORMAL HIGH (ref 98–111)
Creatinine, Ser: 1.73 mg/dL — ABNORMAL HIGH (ref 0.44–1.00)
GFR calc Af Amer: 35 mL/min — ABNORMAL LOW (ref >60)
GFR calc non Af Amer: 30 mL/min — ABNORMAL LOW (ref >60)
Glucose, Bld: 125 mg/dL — ABNORMAL HIGH (ref 70–99)
Potassium: 4 mmol/L (ref 3.5–5.1)
Sodium: 143 mmol/L (ref 135–145)

## 2019-09-14 LAB — CBC WITH DIFFERENTIAL/PLATELET
Abs Immature Granulocytes: 0.1 10*3/uL — ABNORMAL HIGH (ref 0.00–0.07)
Basophils Absolute: 0 10*3/uL (ref 0.0–0.1)
Basophils Relative: 0 %
Eosinophils Absolute: 0 10*3/uL (ref 0.0–0.5)
Eosinophils Relative: 0 %
HCT: 29 % — ABNORMAL LOW (ref 36.0–46.0)
Hemoglobin: 9.6 g/dL — ABNORMAL LOW (ref 12.0–15.0)
Immature Granulocytes: 1 %
Lymphocytes Relative: 18 %
Lymphs Abs: 1.9 10*3/uL (ref 0.7–4.0)
MCH: 31 pg (ref 26.0–34.0)
MCHC: 33.1 g/dL (ref 30.0–36.0)
MCV: 93.5 fL (ref 80.0–100.0)
Monocytes Absolute: 1.3 10*3/uL — ABNORMAL HIGH (ref 0.1–1.0)
Monocytes Relative: 12 %
Neutro Abs: 7.4 10*3/uL (ref 1.7–7.7)
Neutrophils Relative %: 69 %
Platelets: 291 10*3/uL (ref 150–400)
RBC: 3.1 MIL/uL — ABNORMAL LOW (ref 3.87–5.11)
RDW: 12.6 % (ref 11.5–15.5)
WBC: 10.7 10*3/uL — ABNORMAL HIGH (ref 4.0–10.5)
nRBC: 0 % (ref 0.0–0.2)

## 2019-09-14 LAB — CULTURE, BLOOD (ROUTINE X 2)
Culture: NO GROWTH
Culture: NO GROWTH
Special Requests: ADEQUATE

## 2019-09-14 LAB — GLUCOSE, CAPILLARY
Glucose-Capillary: 126 mg/dL — ABNORMAL HIGH (ref 70–99)
Glucose-Capillary: 126 mg/dL — ABNORMAL HIGH (ref 70–99)
Glucose-Capillary: 135 mg/dL — ABNORMAL HIGH (ref 70–99)
Glucose-Capillary: 246 mg/dL — ABNORMAL HIGH (ref 70–99)
Glucose-Capillary: 288 mg/dL — ABNORMAL HIGH (ref 70–99)

## 2019-09-14 LAB — CK TOTAL AND CKMB (NOT AT ARMC)
CK, MB: 7.2 ng/mL — ABNORMAL HIGH (ref 0.5–5.0)
Relative Index: 0.9 (ref 0.0–2.5)
Total CK: 769 U/L — ABNORMAL HIGH (ref 38–234)

## 2019-09-14 LAB — PHOSPHORUS: Phosphorus: 3.3 mg/dL (ref 2.5–4.6)

## 2019-09-14 LAB — MAGNESIUM: Magnesium: 2.2 mg/dL (ref 1.7–2.4)

## 2019-09-14 MED ORDER — ACETAMINOPHEN 325 MG PO TABS
650.0000 mg | ORAL_TABLET | Freq: Four times a day (QID) | ORAL | Status: DC | PRN
  Administered 2019-09-17 – 2019-09-22 (×6): 650 mg via ORAL
  Filled 2019-09-14 (×6): qty 2

## 2019-09-14 MED ORDER — SODIUM CHLORIDE 0.9 % NICU IV INFUSION SIMPLE: INJECTION | INTRAVENOUS | Status: DC

## 2019-09-14 MED ORDER — SODIUM CHLORIDE 0.9 % IV SOLN
INTRAVENOUS | Status: AC
  Administered 2019-09-14: 17:00:00 via INTRAVENOUS

## 2019-09-14 NOTE — Progress Notes (Signed)
PT Cancellation Note  Patient Details Name: Amanda [name] MRN: [id number] DOB: 09/20/1951   Cancelled Treatment:     Patient attempted to see the patient 2x. On first trial patient was agisted. Nursing asked PT hold. On the second trial patients Sao2 was at 85% at rest. Therapy will follow up on 7/27 if patient is appropriate.     Carney Living PT DPT  09/14/2019, 1:13 PM

## 2019-09-14 NOTE — Progress Notes (Signed)
Modified Barium Swallow Progress Note  Patient Details  Name: Amanda [name] MRN: [id number] Date of Birth: 02-19-1951  Today's Date: 09/14/2019  Modified Barium Swallow completed.  Full report located under Chart Review in the Imaging Section.  Brief recommendations include the following:  Clinical Impression  Pt has a moderate oropharyngeal dysphagia that is felt to be a combination of prolonged intubation, baseline structural issues, and current AMS. She has slow posterior propulsion of boluses orally, often allowing entire boluses of puree to sit in her vallecular space as she takes additional bites in her mouth. SLP provided cues to swallow volitionally, which pt then performed swiftly. Her base of tongue retraction and epiglottic inversion are reduced, resulting in mild vallecular residue and intermittent penetration of all liquids. This penetration for the most part is in small amounts and more shallow in the laryngeal vestibule. Pt also has suspected osteophytes at the level of the UES, which in conjunction with reduced pharyngeal strength cause reduced UES opening and pyriform sinus residue. Residue from the pyriform sinuses spills posteriorly over the arytenoids and down the trachea. It is clearly observed happening with thin liquids. With other consistencies it is not always observed while fluoro was on, but barium was noted along the posterior trachea after imaging resumed. A chin tuck helped to increase safety and efficiency with spoonfuls of honey, but pt had a hard time cognitively maintaining this posture. Upon completion of testing after moving back to bed, pt had a prolonged coughing episode with audibly wet vocal quality. Mod cues were given to cough and pt ultimately expectorated barium. Recommend that she remin NPO except for meds crushed in puree and ice chips after oral care. Although some issues are likely more baseline, suspect the will improve with improved mentation,  restrengthening, and additional time post-extubation.   Swallow Evaluation Recommendations       SLP Diet Recommendations: NPO except meds;Ice chips PRN after oral care       Medication Administration: Crushed with puree       Compensations: Slow rate;Small sips/bites       Oral Care Recommendations: Oral care QID   Other Recommendations: Have oral suction available    Venita Sheffield Tashai Catino 09/14/2019,12:35 PM   Pollyann Glen, M.A. [place] Acute Environmental education officer [phone] Office [phone]

## 2019-09-14 NOTE — Progress Notes (Signed)
Pt is very confused this morning; states "I cannot understand how you get to my apartment." Pt tries to get OOB. Nursing staff tried to re-orient the pt with no success. [place] page for Air cabin crew orders. Bed alarm is on. Floor mats are placed as well.

## 2019-09-14 NOTE — Plan of Care (Signed)
  Problem: Activity: Goal: Risk for activity intolerance will decrease Outcome: Progressing   Problem: Coping: Goal: Level of anxiety will decrease Outcome: Progressing   Problem: Safety: Goal: Ability to remain free from injury will improve Outcome: Progressing   

## 2019-09-14 NOTE — Progress Notes (Signed)
PROGRESS NOTE    Patient: Amanda [name]                            PCP: [name], MD                    DOB: Feb 16, 1951            DOA: 09/09/2019 BUL:[id number]             DOS: 09/14/2019, 2:08 PM   LOS: 5 days   Date of Service: The patient was seen and examined on 09/14/2019  Subjective:   First day ICU transfer -Status post extubation, currently on O2 via nasal cannula, 3 L, satting 90% overnight was on room air satting 95%. Patient was seen and examined this morning agitated.  Was quickly reoriented was following all commands. Patient was instructed to stay in bed due to increased risk of falls. Had some rales in upper airway she was instructed to attempt to produce a sputum.  He remained stable on oxygen by nasal cannula. He dynamically stable at this time Denies any pain, fever or chills.  Brief Narrative:  Ms. Clorissa Gruenberg is a 68 yo AAF with PMH/o Non-smoker, diabetes, neuropathy, HTN, HLD presented  from home, was admitted to ICU on 09/10/19 with acute encephalopathy, was intubated briefly for airway protection.  In ICU: Remained hypertensive, hyperglycemic, febrile, and ongoing unresponsiveness. CT/ CTA neg.   Neurology consulted,   Record indicates patient presented encephalopathic to the ED husband was unable to provide any meaningful history.  Patient reported patient was found alert eyes open but was not following any command.  Left arm was contracted.  In ED temp was 1-1.9 rectally, hypertensive with a blood pressure of 207/104, tachycardic, CTA head, CTAc head and neck was inconclusive.  Labs were within normal limits with exception of glucose of 522, BUN 36, creatinine 1.87, lactic acid 2.6.. 8.0, WBC of 14.4, UA was significant for proteinuria, 5 ketones. Patient was subsequent intubated for airway protection.  As she desaturated to 80s in ED.  Neurology was consulted patient was pancultured started on empiric antibiotics of vancomycin, cefepime, Flagyl,  Rocephin for for coverage of possible meningitis. EEG was negative for seizures.  Showing encephalopathy. LP was completed negative for any signs of infection or meningitis.  She was finally stabilized in ICU under PCCM care.  Patient was successfully extubated on 09/13/2019.  Ventilation, sedation was titrated down and was taken off successfully. Hypoxia, hyperglycemia improved. Lactic acid improved, cultures remain to be negative to date LP negative for signs of meningeal infection. 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 On 09/14/19 patient was transferred to the floor, remain on O2 via nasal cannula, mildly confused, Otherwise hemodynamically stable    Assessment & Plan:   Active Problems:   Acute encephalopathy  Acute encephalopathy/  PRES/hypoxic brain injury -Patient improving  Per PCCM/neurology input -Differential diagnosis with CO2 poisoning versus hypo-/hyperglycemia, (brain injury felt to be higher on the imaging but  Methanol toxicity and + no glycol poisoning  MRI 09/10/2019 with T2/flair signal involving the cerebellum bilaterally and left of settable pole.  Could reflect press. -Was weaned off sedation and vent, was extubated successfully -Monitoring mentation closely -steadily improving, moving all 4 extremities, station intact -Agitated mildly confused this a.m., easily was reoriented  -Speech will be consulted -if she passes swallow evaluation resuming and encouraging p.o.  intake -Continue neurochecks -Once stable PT/OT evaluation    Acute respiratory insufficiency -Patient was initially intubated for airway protection due to severe encephalopathy, transient hypoxemia -Status post extubation on 09/13/2019-successfully was weaned off sedation and event -Currently stable on 3 days of oxygen, satting 90%  Hypertensive crisis -prior TTE 12/2018 mild HFpEF, EF 55-60% P:  Goal blood pressure less than  180 PRN labetalol   SIRS/sepsis  -Improved -Cultures negative to date 09/14/2019 -Antibiotics has been de-escalated Antiviral therapy has been discontinued vancomycin is been discontinued currently on cefepime day 5 low threshold to discontinue all antibiotics    AKI on CKD  - previous sCr 1.07/ lactic acidosis -  on metformin at home +/- type A w/ possible sepsis -Continue gentle IV fluid hydration -Electrolytes stabilizing -Monitoring potassium repleted -Improving kidney function, avoiding nephrotoxins   DM II poorly controlled/ Hyperglycemia -For now continue CBG QA CHS, SSI -P.o. intake of tube feeds, encouraging p.o. intake Currently off Levemir   Diarrhea/vomiting/elevated CK -CT revealing mild diffuse colitis and Cholelithiasis.  -Monitoring LFTs -improving -09/08/2019 ultrasound right upper quadrant shows gallbladder sludge with probable small nonshadowing calculi.  No acute evidence of cholecystitis  -Feed has been DC'd, encouraging p.o. intake, speech to evaluate -PRN antiemetics -Continue to monitor CK -gentle IV fluid hydration   Cultures; 9/21 SARS CoV2 >> negative  9/21 BCx 2 >> 9/21 CSF >> white count 12 9/ 21 2020 C. Difficile>> no bowel movements  Antimicrobials: 9/21 flagyl off 9/21 vanc >> 09/12/2019 9/21 cefepime >> 9/21 ceftriaxone off 09/10/2019 acyclovir>> off     DVT prophylaxis: SCD/Compression stockings Code Status:   Code Status: Full Code Family Communication: No family member present at bedside- attempt will be made to update daily  Disposition Plan: Be determined likely home with a previous settings Pending further improvement, PT/OT evaluation  Admission status:  NPATIEN -   Consultants:  PCCM Neurology   Imaging:  9/21 [place] --neg 9/21 CTchest/  abd: 1. Respiratory motion artifact versus less likely developing infiltrate in the left lower lobe. Clinical correlation is recommended. 2. Artifact versus mild colitis.  Clinical correlation is recommended. No bowel obstruction. 3. Cholelithiasis. Ultrasound is recommended for better evaluation of the gallbladder. Aortic Atherosclerosis   9/21 CTA head/ neck  1. 60% diameter stenosis proximal right internal carotid artery due to heavily calcified plaque. Moderate stenosis right cavernous carotid. 2. Mild atherosclerotic calcification left carotid bulb without stenosis. Moderate stenosis left cavernous carotid due to calcific stenosis. 3. Mild stenosis of the vertebral artery bilaterally at the skull base otherwise clear 4. No intracranial large vessel occlusion. Mild stenosis right M1 segment. 5. 6 x 10 mm ossification ligamentum flavum on the right at C6-7 may represent meningioma.  9/21 EGG >>generalized nonspecific cerebral dysfunction (encephalopathy). There was no seizure or seizure predisposition recorded on this study.  9/21 LP>> clear opening pressure 14 white count 12>>  09/08/2019 MRI of the brain: >> no acute abnormality show findings T2/flair signal abnormality which could be pres.   Procedures:   No admission procedures for hospital encounter.  Antimicrobials:  Anti-infectives (From admission, onward)   Start     Dose/Rate Route Frequency Ordered Stop   09/11/19 0600  acyclovir (ZOVIRAX) 500 mg in dextrose 5 % 100 mL IVPB  Status:  Discontinued     500 mg 110 mL/hr over 60 Minutes Intravenous Every 24 hours 09/10/19 0641 09/12/19 0932   09/10/19 1800  ceFEPIme (MAXIPIME) 2 g in sodium chloride 0.9 % 100 mL IVPB  Status:  Discontinued     2 g 200 mL/hr over 30 Minutes Intravenous Every 24 hours 09/09/19 1835 09/10/19 0612   09/10/19 1800  ceFEPIme (MAXIPIME) 2 g in sodium chloride 0.9 % 100 mL IVPB     2 g 200 mL/hr over 30 Minutes Intravenous Every 24 hours 09/10/19 0612 09/14/19 2359   09/10/19 1330  vancomycin (VANCOCIN) IVPB 1000 mg/200 mL premix  Status:  Discontinued     1,000 mg 200 mL/hr over 60 Minutes Intravenous Every 48  hours 09/10/19 1251 09/12/19 0905   09/10/19 0645  acyclovir (ZOVIRAX) 500 mg in dextrose 5 % 100 mL IVPB     500 mg 110 mL/hr over 60 Minutes Intravenous  Once 09/10/19 0641 09/10/19 0828   09/09/19 2100  cefTRIAXone (ROCEPHIN) 2 g in sodium chloride 0.9 % 100 mL IVPB     2 g 200 mL/hr over 30 Minutes Intravenous  Once 09/09/19 2056 09/09/19 2159   09/09/19 1834  vancomycin variable dose per unstable renal function (pharmacist dosing)  Status:  Discontinued      Does not apply See admin instructions 09/09/19 1835 09/10/19 1251   09/09/19 1645  ceFEPIme (MAXIPIME) 2 g in sodium chloride 0.9 % 100 mL IVPB     2 g 200 mL/hr over 30 Minutes Intravenous  Once 09/09/19 1638 09/09/19 1736   09/09/19 1645  metroNIDAZOLE (FLAGYL) IVPB 500 mg     500 mg 100 mL/hr over 60 Minutes Intravenous  Once 09/09/19 1638 09/09/19 1900   09/09/19 1645  vancomycin (VANCOCIN) IVPB 1000 mg/200 mL premix     1,000 mg 200 mL/hr over 60 Minutes Intravenous  Once 09/09/19 1638 09/09/19 1900       Medication:   chlorhexidine gluconate (MEDLINE KIT)  15 mL Mouth Rinse BID   heparin  5,000 Units Subcutaneous Q8H   insulin aspart  0-15 Units Subcutaneous Q4H   pantoprazole (PROTONIX) IV  40 mg Intravenous Daily    sodium chloride, hydrALAZINE, labetalol, ondansetron (ZOFRAN) IV   Objective:   Vitals:   09/13/19 2340 09/14/19 0352 09/14/19 1239 09/14/19 1254  BP: (!) 151/64 125/64 (!) 158/55   Pulse: 95 100 (!) 101   Resp: 20 17    Temp: 98.9 F (37.2 C) 98.7 F (37.1 C) 98.4 F (36.9 C)   TempSrc: Oral Oral Oral   SpO2: 99% 95% (!) 86% 90%  Weight:      Height:        Intake/Output Summary (Last 24 hours) at 09/14/2019 1408 Last data filed at 09/14/2019 0900 Gross per 24 hour  Intake 1498.28 ml  Output 575 ml  Net 923.28 ml   Filed Weights   09/10/19 0408 09/12/19 0312 09/13/19 0500  Weight: 56.3 kg 55.8 kg 56.8 kg     Examination:   Physical Exam  Constitution:  Alert,  disoriented, confused easily reoriented Psychiatric: Remained agitated, questionable poor insight and judgment HEENT: Normocephalic, PERRL, otherwise with in Normal limits  Chest:Chest symmetric Cardio vascular:  S1/S2, RRR, No murmure, No Rubs or Gallops  pulmonary: Clear to auscultation bilaterally, respirations unlabored, negative wheezes / crackles Abdomen: Soft, non-tender, non-distended, bowel sounds,no masses, no organomegaly Muscular skeletal: Limited exam - in bed, able to move all 4 extremities, Normal strength,  Neuro: CNII-XII intact. , normal motor and sensation, reflexes intact , no obvious focal deficit at this time Aside from confusion Extremities: No pitting edema lower extremities, +2 pulses  Skin: Dry, warm to touch, negative for any Rashes, No  open wounds Wounds: per nursing documentation  LABs:  CBC Latest Ref Rng & Units 09/14/2019 09/13/2019 09/12/2019  WBC 4.0 - 10.5 K/uL 10.7(H) 12.1(H) 10.6(H)  Hemoglobin 12.0 - 15.0 g/dL 9.6(L) 9.9(L) 8.0(L)  Hematocrit 36.0 - 46.0 % 29.0(L) 29.8(L) 23.3(L)  Platelets 150 - 400 K/uL 291 181 156   CMP Latest Ref Rng & Units 09/14/2019 09/13/2019 09/12/2019  Glucose 70 - 99 mg/dL 125(H) 150(H) 101(H)  BUN 8 - 23 mg/dL 35(H) 41(H) 40(H)  Creatinine 0.44 - 1.00 mg/dL 1.73(H) 1.61(H) 1.71(H)  Sodium 135 - 145 mmol/L 143 143 148(H)  Potassium 3.5 - 5.1 mmol/L 4.0 4.6 3.0(L)  Chloride 98 - 111 mmol/L 117(H) 115(H) 120(H)  CO2 22 - 32 mmol/L 16(L) 17(L) 20(L)  Calcium 8.9 - 10.3 mg/dL 8.3(L) 8.3(L) 7.4(L)  Total Protein 6.5 - 8.1 g/dL - - -  Total Bilirubin 0.3 - 1.2 mg/dL - - -  Alkaline Phos 38 - 126 U/L - - -  AST 15 - 41 U/L - - -  ALT 0 - 44 U/L - - -    SIGNED: Deatra James, MD, FACP, FHM. [facility],  Pager [phone]  If 7PM-7AM, please contact night-coverage [email or web address], Password [facility] 09/14/2019, 2:08 PM

## 2019-09-14 NOTE — Progress Notes (Signed)
Speech Language Pathology Treatment: Dysphagia  Patient Details Name: Amanda [name] MRN: [id number] DOB: [birth year] Today's Date: 09/14/2019 Time: 6962-9528 SLP Time Calculation (min) (ACUTE ONLY): 17 min  Assessment / Plan / Recommendation Clinical Impression  Pt's voice sounds improved from previous date and she has less coughing with nectar thick liquids and purees initially. Coughing is still noted with thin liquids and at times when she tries to talk with solids in her mouth. SLP initially was going to start a Dys 1 diet and nectar thick liquids; however, pt started to have delayed coughing as SLP was documenting outside her room. Her voice sounded wet and her cough was productive of mild amounts of what looked like applesauce. Recommend proceeding with MBS prior to initiating meal trays. She could have ice chips after oral care pending completion, scheduled for later this morning with radiology.    HPI HPI: Pt is a 68 yo female presenting with AMS. 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 Pt required intubation 9/21-9/25. PMH: DM, HTN, HLD      SLP Plan  MBS       Recommendations  Diet recommendations: Nectar-thick liquid Liquids provided via: Cup;Teaspoon Medication Administration: Crushed with puree Supervision: Patient able to self feed;Full supervision/cueing for compensatory strategies Compensations: Slow rate;Small sips/bites Postural Changes and/or Swallow Maneuvers: Seated upright 90 degrees;Upright 30-60 min after meal                Oral Care Recommendations: Oral care QID Follow up Recommendations: (tba) SLP Visit Diagnosis: Dysphagia, unspecified (R13.10) Plan: MBS       GO                Amanda [name] 09/14/2019, 9:19 AM  Amanda [name], M.A. CCC-SLP Acute Herbalist [phone] Office [phone]

## 2019-09-15 ENCOUNTER — Inpatient Hospital Stay (HOSPITAL_COMMUNITY): Payer: Medicare Other

## 2019-09-15 DIAGNOSIS — N179 Acute kidney failure, unspecified: Secondary | ICD-10-CM

## 2019-09-15 DIAGNOSIS — J9690 Respiratory failure, unspecified, unspecified whether with hypoxia or hypercapnia: Secondary | ICD-10-CM

## 2019-09-15 DIAGNOSIS — N189 Chronic kidney disease, unspecified: Secondary | ICD-10-CM

## 2019-09-15 DIAGNOSIS — G9341 Metabolic encephalopathy: Secondary | ICD-10-CM

## 2019-09-15 DIAGNOSIS — N183 Chronic kidney disease, stage 3 (moderate): Secondary | ICD-10-CM

## 2019-09-15 LAB — URINALYSIS, COMPLETE (UACMP) WITH MICROSCOPIC
Bacteria, UA: NONE SEEN
Bilirubin Urine: NEGATIVE
Glucose, UA: 150 mg/dL — AB
Ketones, ur: 20 mg/dL — AB
Leukocytes,Ua: NEGATIVE
Nitrite: NEGATIVE
Protein, ur: 100 mg/dL — AB
Specific Gravity, Urine: 1.012 (ref 1.005–1.030)
pH: 5 (ref 5.0–8.0)

## 2019-09-15 LAB — RAPID URINE DRUG SCREEN, HOSP PERFORMED
Amphetamines: NOT DETECTED
Barbiturates: NOT DETECTED
Benzodiazepines: POSITIVE — AB
Cocaine: NOT DETECTED
Opiates: NOT DETECTED
Tetrahydrocannabinol: NOT DETECTED

## 2019-09-15 LAB — BASIC METABOLIC PANEL
Anion gap: 11 (ref 5–15)
BUN: 29 mg/dL — ABNORMAL HIGH (ref 8–23)
CO2: 16 mmol/L — ABNORMAL LOW (ref 22–32)
Calcium: 8.2 mg/dL — ABNORMAL LOW (ref 8.9–10.3)
Chloride: 119 mmol/L — ABNORMAL HIGH (ref 98–111)
Creatinine, Ser: 1.74 mg/dL — ABNORMAL HIGH (ref 0.44–1.00)
GFR calc Af Amer: 35 mL/min — ABNORMAL LOW (ref >60)
GFR calc non Af Amer: 30 mL/min — ABNORMAL LOW (ref >60)
Glucose, Bld: 189 mg/dL — ABNORMAL HIGH (ref 70–99)
Potassium: 4.3 mmol/L (ref 3.5–5.1)
Sodium: 146 mmol/L — ABNORMAL HIGH (ref 135–145)

## 2019-09-15 LAB — GLUCOSE, CAPILLARY
Glucose-Capillary: 138 mg/dL — ABNORMAL HIGH (ref 70–99)
Glucose-Capillary: 158 mg/dL — ABNORMAL HIGH (ref 70–99)
Glucose-Capillary: 174 mg/dL — ABNORMAL HIGH (ref 70–99)
Glucose-Capillary: 192 mg/dL — ABNORMAL HIGH (ref 70–99)
Glucose-Capillary: 199 mg/dL — ABNORMAL HIGH (ref 70–99)
Glucose-Capillary: 200 mg/dL — ABNORMAL HIGH (ref 70–99)
Glucose-Capillary: 298 mg/dL — ABNORMAL HIGH (ref 70–99)
Glucose-Capillary: 80 mg/dL (ref 70–99)

## 2019-09-15 LAB — VITAMIN B12: Vitamin B-12: 2048 pg/mL — ABNORMAL HIGH (ref 180–914)

## 2019-09-15 LAB — FOLATE: Folate: 14 ng/mL (ref >5.9)

## 2019-09-15 MED ORDER — INSULIN DETEMIR 100 UNIT/ML ~~LOC~~ SOLN
5.0000 [IU] | Freq: Every day | SUBCUTANEOUS | Status: DC
  Administered 2019-09-15 – 2019-09-24 (×10): 5 [IU] via SUBCUTANEOUS
  Filled 2019-09-15 (×10): qty 0.05

## 2019-09-15 MED ORDER — HYDRALAZINE HCL 20 MG/ML IJ SOLN: 10.0000 mg | Freq: Four times a day (QID) | INTRAMUSCULAR | Status: DC | PRN

## 2019-09-15 MED ORDER — SODIUM CHLORIDE 0.9 % IV SOLN
INTRAVENOUS | Status: DC
  Administered 2019-09-15: 12:00:00 via INTRAVENOUS

## 2019-09-15 NOTE — Evaluation (Signed)
Physical Therapy Evaluation Patient Details Name: Amanda [name] MRN: [id number] DOB: 02/09/1951 Today's Date: 09/15/2019   History of Present Illness  Pt is a 68 yo female presenting with AMS. 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 Pt required intubation 9/21-9/25. PMH: DM, HTN, HLD  Clinical Impression   Pt admitted with above diagnosis. 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 Pt will benefit from skilled PT to increase their independence and safety with mobility to allow discharge to the venue listed below.       Follow Up Recommendations CIR    Equipment Recommendations  Rolling walker with 5" wheels;3in1 (PT)    Recommendations for Other Services OT consult(ordered per protocol)     Precautions / Restrictions Precautions Precautions: Fall      Mobility  Bed Mobility Overal bed mobility: Needs Assistance Bed Mobility: Supine to Sit     Supine to sit: Supervision     General bed mobility comments: Cues to initiate; Supervision for safety and management of lines  Transfers Overall transfer level: Needs assistance Equipment used: 2 person hand held assist Transfers: Sit to/from Stand Sit to Stand: Mod assist         General transfer comment: Mod assist to rise and steady; tending brace backs of LEs against bed to steady  Ambulation/Gait Ambulation/Gait assistance: Mod assist;+2 safety/equipment Gait Distance (Feet): 6 Feet Assistive  device: 2 person hand held assist Gait Pattern/deviations: Step-through pattern(erratic step length and width)     General Gait Details: Requiring external support fo rsteadiness with amb  Stairs            Wheelchair Mobility    Modified Rankin (Stroke Patients Only)       Balance Overall balance assessment: Needs assistance   Sitting balance-Leahy Scale: Fair       Standing balance-Leahy Scale: Poor                               Pertinent Vitals/Pain Pain Assessment: No/denies pain    Home Living Family/patient expects to be discharged to:: Private residence Living Arrangements: Spouse/significant other Available Help at Discharge: Family;Other (Comment)(but it sounds like pt is caregiver for spouse) Type of Home: House Home Access: Stairs to enter              Prior Function Level of Independence: Independent         Comments: All this is per pt report, and she is pleasantly confused; Will need more info re: any available assist to her at home     Hand Dominance        Extremity/Trunk Assessment   Upper Extremity Assessment Upper Extremity Assessment: Generalized weakness    Lower Extremity Assessment Lower Extremity Assessment: Generalized weakness       Communication      Cognition Arousal/Alertness: Awake/alert Behavior During Therapy: WFL for tasks assessed/performed Overall Cognitive Status: Impaired/Different from baseline Area of Impairment: Orientation;Memory;Problem solving  Orientation Level: Disoriented to;Place;Situation   Memory: (amnetsic to events leading to her admission)       Problem Solving: Slow processing        General Comments General comments (skin integrity, edema, etc.): Supplemental O2 was off pt upon entry; spot checked pt with activit on Room Air and O2 sats were 98-99%    Exercises     Assessment/Plan    PT Assessment Patient needs continued PT services   PT Problem List Decreased strength;Decreased activity tolerance;Decreased balance;Decreased mobility;Decreased coordination;Decreased cognition;Decreased knowledge of use of DME;Decreased safety awareness;Decreased knowledge of precautions       PT Treatment Interventions DME instruction;Gait training;Stair training;Functional mobility training;Therapeutic activities;Therapeutic exercise;Balance training;Neuromuscular re-education;Cognitive remediation;Patient/family education    PT Goals (Current goals can be found in the Care Plan section)  Acute Rehab PT Goals Patient Stated Goal: walk bettter PT Goal Formulation: With patient Time For Goal Achievement: 09/29/19 Potential to Achieve Goals: Good    Frequency Min 3X/week   Barriers to discharge Other (comment) Will need to find out how much assist pt (and her husband) will have at home; Can any family provide assist?    Co-evaluation               AM-PAC PT "6 Clicks" Mobility  Outcome Measure Help needed turning from your back to your side while in a flat bed without using bedrails?: None Help needed moving from lying on your back to sitting on the side of a flat bed without using bedrails?: None Help needed moving to and from a bed to a chair (including a wheelchair)?: A Little Help needed standing up from a chair using your arms (e.g., wheelchair or bedside chair)?: A Lot Help needed to walk in hospital room?: A Lot Help needed climbing 3-5 steps with a railing? : Total 6 Click Score: 16    End of Session Equipment Utilized During Treatment: Gait belt Activity Tolerance: Patient tolerated treatment well Patient left: in chair;with call bell/phone within reach;with chair alarm set Nurse Communication: Mobility status PT Visit Diagnosis: Unsteadiness on feet (R26.81);Other abnormalities of gait and mobility (R26.89)    Time: 0347-4259 PT Time Calculation (min) (ACUTE ONLY): 23 min   Charges:   PT Evaluation $PT  Eval Moderate Complexity: 1 Mod PT Treatments $Therapeutic Activity: 8-22 mins        Roney Marion, PT  Acute Rehabilitation Services Pager [phone] Office [phone]   [place] 09/15/2019, 12:43 PM

## 2019-09-15 NOTE — Progress Notes (Signed)
PROGRESS NOTE  Amanda [name]:[id number] DOB: 23-Sep-1951 DOA: 09/09/2019 PCP: [name], MD  Brief History:  68 year old female with history of diabetes, HTN, and neuropathy presenting from home with altered mental status.  EMS had reported that patient was found alert with eyes open, not following commands, and left arm contracted in at the base of her disabled husband bed covered in feces.  Husband not ale to provide any insight on scene or last seen well. Neighbors had reported that her car door had been opened since last night and front door found open.  Found to glucose reading high.  In ER, patient febrile 101.9 rectal, hypertensive 207/104, and tachycardic.  CT head and CTA head and neck without acute findings.  Labs noted for  Na 139, CO2 21, glucose 522, BUN 36, sCr 1.87, CK 125, LA 2.6->3, WBC 14.4, normal coags, UA with high glucose, 5 ketones, >300 protein.  She required intubation for airway protection after some desaturation episodes in the 80's.  Neurology consulted.  Cultures sent and started on empiric vancomycin, cefepime, and flagyl and ceftriaxone add for meningeal coverage.  30 cc/kg fluid bolus given.  EEG showing negative for seizure activity, showing encephalopathy.  LP cultures were negative.  CSF HSV DNA was negative.[name]  PCCM admitted the patient as the patient was on mechanical ventilation.  She was extubated on 09/13/2019.  Patient was transferred to the hospital service on 09/14/2019.  Assessment/Plan: Acute metabolic encephalopathy -Gradually improving clinically, but remains pleasantly confused -Initially admitted by PCCM -Patient was followed by neurology who has since signed off -MRI brain findings felt to be consistent with PRES, but CO poisoning and severe hypoglycemic brain injury are felt to be higher on the imaging DDx -Likely multifactorial including acute on chronic renal failure, PRES, HONK, hypertensive encephalopathy, and possible  infectious process (aspiration) -EEG--severe diffuse encephalopathy, no seizure -Ammonia 22 -Check serum N56 -Check folic acid -02/01/864 UA negative for pyuria -Urine drug screen  Rhabdomyolysis -Restart IV fluids as the patient is essentially n.p.o. secondary to her dysphagia -CPK peaked at 2271 -Repeat CPK  Malignant hypertension -Holding lisinopril secondary to acute on chronic renal failure -Blood pressure remains labile -Hold off on starting antihypertensive presently and monitor blood pressure trends -hydralazine prn SBP >180  Acute on chronic renal failure -Baseline creatinine 1.1-1.3 -Serum creatinine peaked 2.25 -Restart IV fluids -Renal ultrasound -Holding lisinopril  Dysphagia -Patient followed by speech therapy -She failed MBS--> n.p.o. except for meds and ice chips -Ask speech therapy to reevaluate as the patient is now more alert  SIRS -Patient finished 5 days of cefepime to cover for aspiration pneumonitis -She still had a low-grade temperature 100.5 F on 09/14/2019 -Repeat urinalysis -Hold off on starting any new antibiotics as the patient is hemodynamically stable  Uncontrolled diabetes mellitus type 2 with hyperglycemia -09/09/2019 hemoglobin A1c 10.9 -NovoLog sliding scale -Holding metformin -holding Ozempic -Patient was not on any insulin prior to admission -start lantus 5 units  Hyperlipidemia -Holding statin secondary to elevated CPK  Deconditioning -PT evaluation      Disposition Plan:   Home in 1-2 days  Family Communication: No  Family at bedside  Consultants:  Neurology, PCCM  Code Status:  FULL  DVT Prophylaxis:  Lolo Heparin    Procedures: As Listed in Progress Note Above         Subjective: Patient denies fevers, chills, headache, chest pain, dyspnea, nausea, vomiting, diarrhea, abdominal  pain, dysuria, hematuria, hematochezia, and melena.    Objective: Vitals:   09/14/19 1921 09/14/19 2348 09/15/19 0413  09/15/19 0743  BP: (!) 171/69 116/60 (!) 149/66 118/68  Pulse: 97 76 86   Resp: _0 Temp: 99.5 F (37.5 C)  98.6 F (37 C) 98.5 F (36.9 C)  TempSrc: Oral  Oral Oral  SpO2: 99%  96% 98%  Weight:      Height:        Intake/Output Summary (Last 24 hours) at 09/15/2019 0932 Last data filed at 09/15/2019 0400 Gross per 24 hour  Intake 480 ml  Output 450 ml  Net 30 ml   Weight change:  Exam:   General:  Pt is alert, follows commands appropriately, not in acute distress  HEENT: No icterus, No thrush, No neck mass, [place]/AT  Cardiovascular: RRR, S1/S2, no rubs, no gallops  Respiratory: fine bibasilar crackles. No wheeze  Abdomen: Soft/+BS, non tender, non distended, no guarding  Extremities: No edema, No lymphangitis, No petechiae, No rashes, no synovitis   Data Reviewed: I have personally reviewed following labs and imaging studies Basic Metabolic Panel: Recent Labs  Lab 09/11/19 0650 09/11/19 1620 09/12/19 0434 09/13/19 0541 09/14/19 0400 09/15/19 0347  NA 139  --  148* 143 143 146*  K 3.8  --  3.0* 4.6 4.0 4.3  CL 111  --  120* 115* 117* 119*  CO2 19*  --  20* 17* 16* 16*  GLUCOSE 374*  --  101* 150* 125* 189*  BUN 51*  --  40* 41* 35* 29*  CREATININE 2.25*  --  1.71* 1.61* 1.73* 1.74*  CALCIUM 8.1*  --  7.4* 8.3* 8.3* 8.2*  MG 2.4 2.5* 2.3 2.4 2.2  --   PHOS 3.2 3.1 3.2 3.6 3.3  --    Liver Function Tests: Recent Labs  Lab 09/09/19 1636 09/10/19 0404 09/13/19 0541  AST 45* 62*  --   ALT 51* 47*  --   ALKPHOS 79 56  --   BILITOT 1.2 0.7  --   PROT 7.8 6.3*  --   ALBUMIN 4.5 3.4* 2.5*   No results for input(s): LIPASE, AMYLASE in the last 168 hours. Recent Labs  Lab 09/10/19 0206  AMMONIA 22   Coagulation Profile: Recent Labs  Lab 09/09/19 1636  INR 1.1   CBC: Recent Labs  Lab 09/09/19 1636  09/10/19 0404 09/11/19 0650 09/12/19 0434 09/13/19 0541 09/14/19 0400  WBC 14.4*  --  14.7* 13.9* 10.6* 12.1* 10.7*  NEUTROABS 13.1*  --    --  12.4* 8.6* 9.5* 7.4  HGB 12.2   < > 11.0* 9.1* 8.0* 9.9* 9.6*  HCT 36.4   < > 31.7* 26.5* 23.3* 29.8* 29.0*  MCV 90.5  --  90.3 91.7 91.4 94.9 93.5  PLT 360  --  291 191 156 181 291   < > = values in this interval not displayed.   Cardiac Enzymes: Recent Labs  Lab 09/10/19 0404 09/11/19 0650 09/12/19 0434 09/13/19 1119 09/14/19 0400  CKTOTAL 534* 2,271* 1,454* 655* 769*  CKMB  --   --   --   --  7.2*   BNP: Invalid input(s): POCBNP CBG: Recent Labs  Lab 09/14/19 1639 09/14/19 2008 09/15/19 0003 09/15/19 0410 09/15/19 0746  GLUCAP 288* 298* 199* 200* 158*   HbA1C: No results for input(s): HGBA1C in the last 72 hours. Urine analysis:    Component Value Date/Time   COLORURINE YELLOW 09/09/2019 1636  APPEARANCEUR HAZY (A) 09/09/2019 1636   LABSPEC 1.023 09/09/2019 1636   PHURINE 5.0 09/09/2019 1636   GLUCOSEU >=500 (A) 09/09/2019 1636   GLUCOSEU NEGATIVE 05/10/2010 1123   HGBUR SMALL (A) 09/09/2019 1636   BILIRUBINUR NEGATIVE 09/09/2019 1636   BILIRUBINUR Negative 11/24/2015 1614   KETONESUR 5 (A) 09/09/2019 1636   PROTEINUR >=300 (A) 09/09/2019 1636   UROBILINOGEN 0.2 01/28/2017 1803   NITRITE NEGATIVE 09/09/2019 1636   LEUKOCYTESUR NEGATIVE 09/09/2019 1636   Sepsis Labs: _0 (procalcitonin:4,lacticidven:4) ) Recent Results (from the past 240 hour(s))  SARS Coronavirus 2 [facility] order, Performed in [facility] hospital lab) Nasopharyngeal Urine, Catheterized     Status: None   Collection Time: 09/09/19  4:52 PM   Specimen: Urine, Catheterized; Nasopharyngeal  Result Value Ref Range Status   SARS Coronavirus 2 NEGATIVE NEGATIVE Final    Comment: (NOTE) If result is NEGATIVE SARS-CoV-2 target nucleic acids are NOT DETECTED. The SARS-CoV-2 RNA is generally detectable in upper and lower  respiratory specimens during the acute phase of infection. The lowest  concentration of SARS-CoV-2 viral copies this assay can detect is 250  copies / mL. A  negative result does not preclude SARS-CoV-2 infection  and should not be used as the sole basis for treatment or other  patient management decisions.  A negative result may occur with  improper specimen collection / handling, submission of specimen other  than nasopharyngeal swab, presence of viral mutation(s) within the  areas targeted by this assay, and inadequate number of viral copies  (<250 copies / mL). A negative result must be combined with clinical  observations, patient history, and epidemiological information. If result is POSITIVE SARS-CoV-2 target nucleic acids are DETECTED. The SARS-CoV-2 RNA is generally detectable in upper and lower  respiratory specimens dur ing the acute phase of infection.  Positive  results are indicative of active infection with SARS-CoV-2.  Clinical  correlation with patient history and other diagnostic information is  necessary to determine patient infection status.  Positive results do  not rule out bacterial infection or co-infection with other viruses. If result is PRESUMPTIVE POSTIVE SARS-CoV-2 nucleic acids MAY BE PRESENT.   A presumptive positive result was obtained on the submitted specimen  and confirmed on repeat testing.  While 2019 novel coronavirus  (SARS-CoV-2) nucleic acids may be present in the submitted sample  additional confirmatory testing may be necessary for epidemiological  and / or clinical management purposes  to differentiate between  SARS-CoV-2 and other Sarbecovirus currently known to infect humans.  If clinically indicated additional testing with an alternate test  methodology [phone]) is advised. The SARS-CoV-2 RNA is generally  detectable in upper and lower respiratory sp ecimens during the acute  phase of infection. The expected result is Negative. Fact Sheet for Patients:  StrictlyIdeas.no Fact Sheet for Healthcare Providers: BankingDealers.co.za This test is not  yet approved or cleared by the [place] FDA and has been authorized for detection and/or diagnosis of SARS-CoV-2 by FDA under an Emergency Use Authorization (EUA).  This EUA will remain in effect (meaning this test can be used) for the duration of the COVID-19 declaration under Section 564(b)(1) of the Act, 21 U.S.C. section 360bbb-3(b)(1), unless the authorization is terminated or revoked sooner. Performed at [facility], [place] [address] [place], [place] [address]   Urine Culture     Status: None   Collection Time: 09/09/19  4:52 PM   Specimen: Urine, Catheterized  Result Value Ref Range Status   Specimen  Description URINE, CATHETERIZED  Final   Special Requests NONE  Final   Culture   Final    NO GROWTH Performed at [facility], [place] [address], [place], [place] [address]    Report Status 09/10/2019 FINAL  Final  Blood Culture (routine x 2)     Status: None   Collection Time: 09/09/19  5:08 PM   Specimen: BLOOD  Result Value Ref Range Status   Specimen Description BLOOD LEFT ANTECUBITAL  Final   Special Requests   Final    BOTTLES DRAWN AEROBIC AND ANAEROBIC Blood Culture results may not be optimal due to an excessive volume of blood received in culture bottles   Culture   Final    NO GROWTH 5 DAYS Performed at [facility], [place] [address], [place], [place] [address]    Report Status 09/14/2019 FINAL  Final  Blood Culture (routine x 2)     Status: None   Collection Time: 09/09/19  5:10 PM   Specimen: BLOOD  Result Value Ref Range Status   Specimen Description BLOOD BLOOD RIGHT FOREARM  Final   Special Requests   Final    BOTTLES DRAWN AEROBIC AND ANAEROBIC Blood Culture adequate volume   Culture   Final    NO GROWTH 5 DAYS Performed at [facility], [place] [address], [place], [place] [address]    Report Status 09/14/2019 FINAL  Final  CSF culture     Status: None   Collection Time: 09/10/19 12:02 AM   Specimen: CSF; Cerebrospinal Fluid   Result Value Ref Range Status   Specimen Description CSF  Final   Special Requests NONE  Final   Gram Stain   Final    WBC PRESENT,BOTH PMN AND MONONUCLEAR NO ORGANISMS SEEN CYTOSPIN SMEAR    Culture   Final    NO GROWTH 3 DAYS Performed at [facility], [place] [address] [place], [place] [address]    Report Status 09/13/2019 FINAL  Final  MRSA PCR Screening     Status: None   Collection Time: 09/10/19 12:54 AM  Result Value Ref Range Status   MRSA by PCR NEGATIVE NEGATIVE Final    Comment:        The GeneXpert MRSA Assay (FDA approved for NASAL specimens only), is one component of a comprehensive MRSA colonization surveillance program. It is not intended to diagnose MRSA infection nor to guide or monitor treatment for MRSA infections. Performed at [facility], [place] [address], [place], [place] [address]   Respiratory Panel by PCR     Status: None   Collection Time: 09/11/19 10:45 AM   Specimen: Nasopharyngeal Swab; Respiratory  Result Value Ref Range Status   Adenovirus NOT DETECTED NOT DETECTED Final   Coronavirus 229E NOT DETECTED NOT DETECTED Final    Comment: (NOTE) The Coronavirus on the Respiratory Panel, DOES NOT test for the novel  Coronavirus (2019 nCoV)    Coronavirus HKU1 NOT DETECTED NOT DETECTED Final   Coronavirus NL63 NOT DETECTED NOT DETECTED Final   Coronavirus OC43 NOT DETECTED NOT DETECTED Final   Metapneumovirus NOT DETECTED NOT DETECTED Final   Rhinovirus / Enterovirus NOT DETECTED NOT DETECTED Final   Influenza A NOT DETECTED NOT DETECTED Final   Influenza B NOT DETECTED NOT DETECTED Final   Parainfluenza Virus 1 NOT DETECTED NOT DETECTED Final   Parainfluenza Virus 2 NOT DETECTED NOT DETECTED Final   Parainfluenza Virus 3 NOT DETECTED NOT DETECTED Final  Parainfluenza Virus 4 NOT DETECTED NOT DETECTED Final   Respiratory Syncytial Virus NOT DETECTED NOT DETECTED Final   Bordetella pertussis NOT DETECTED NOT DETECTED Final    Chlamydophila pneumoniae NOT DETECTED NOT DETECTED Final   Mycoplasma pneumoniae NOT DETECTED NOT DETECTED Final    Comment: Performed at [facility], [place] [address], [place], [place] [address]     Scheduled Meds:  chlorhexidine gluconate (MEDLINE KIT)  15 mL Mouth Rinse BID   heparin  5,000 Units Subcutaneous Q8H   insulin aspart  0-15 Units Subcutaneous Q4H   pantoprazole (PROTONIX) IV  40 mg Intravenous Daily   Continuous Infusions:  sodium chloride 50 mL/hr at 09/14/19 0041   sodium chloride 10 mL/hr at 09/13/19 2200    Procedures/Studies: Ct Abdomen Pelvis Wo Contrast  Result Date: 09/09/2019 CLINICAL DATA:  68 year old female with shortness of breath. Patient was found on the floor. EXAM: CT CHEST, ABDOMEN AND PELVIS WITHOUT CONTRAST TECHNIQUE: Multidetector CT imaging of the chest, abdomen and pelvis was performed following the standard protocol without IV contrast. COMPARISON:  CT of the abdomen pelvis dated 12/26/2017 FINDINGS: Evaluation of this exam is limited in the absence of intravenous contrast. Evaluation is also limited due to respiratory motion artifact as well as streak artifact caused by patient's arms. CT CHEST FINDINGS Cardiovascular: There is no cardiomegaly or pericardial effusion. There is coronary vascular calcification. The thoracic aorta and central pulmonary arteries are grossly unremarkable on this noncontrast CT. Mediastinum/Nodes: No hilar or mediastinal adenopathy. The esophagus and the thyroid gland are grossly unremarkable. No mediastinal fluid collection. Lungs/Pleura: Evaluation of the lungs is somewhat limited due to respiratory motion artifact. Focal area of increased hazy density at the left lower lobe (series 4, image 79 and coronal series 5, image 61) may be artifactual and represent respiratory motion. Developing infiltrate is not excluded. Clinical correlation is recommended. No focal consolidation, pleural effusion, or pneumothorax. Mucus  secretions noted within the trachea. The central airways otherwise remain patent. Musculoskeletal: No chest wall mass or suspicious bone lesions identified. CT ABDOMEN PELVIS FINDINGS No intra-abdominal free air or free fluid. Hepatobiliary: The liver is grossly unremarkable as visualized. No intrahepatic biliary ductal dilatation. There is layering stone and sludge within the gallbladder. Evaluation of the gallbladder is otherwise very limited due to respiratory motion and streak artifact caused by patient's arms. Ultrasound is recommended for better evaluation of the gallbladder. Pancreas: The pancreas is grossly unremarkable as visualized. Spleen: Normal in size without focal abnormality. Adrenals/Urinary Tract: The adrenal glands are unremarkable. There is no hydronephrosis or nephrolithiasis on either side. Subcentimeter left renal upper pole hypodense lesions are not characterized. The visualized ureters and urinary bladder appear unremarkable. Stomach/Bowel: Mild diffuse thickened appearance of the colon likely related to underdistention and respiratory motion artifact. Mild colitis is not entirely excluded. Clinical correlation is recommended. There is no bowel obstruction. The appendix is unremarkable as visualized. Vascular/Lymphatic: Mild aortoiliac atherosclerotic disease. The IVC is unremarkable. No portal venous gas. There is no adenopathy. Reproductive: The uterus is suboptimally visualized, hysterectomy. Other: Midline vertical anterior pelvic wall incisional scar. Musculoskeletal: Degenerative changes of the spine. No acute osseous pathology. IMPRESSION: 1. Respiratory motion artifact versus less likely developing infiltrate in the left lower lobe. Clinical correlation is recommended. 2. Artifact versus mild colitis. Clinical correlation is recommended. No bowel obstruction. 3. Cholelithiasis. Ultrasound is recommended for better evaluation of the gallbladder. Aortic Atherosclerosis (ICD10-I70.0).  Electronically Signed   By: Anner Crete M.D.   On: 09/09/2019  19:31   Ct Angio Head W Or Wo Contrast  Result Date: 09/09/2019 CLINICAL DATA:  Unresponsive. EXAM: CT ANGIOGRAPHY HEAD AND NECK TECHNIQUE: Multidetector CT imaging of the head and neck was performed using the standard protocol during bolus administration of intravenous contrast. Multiplanar CT image reconstructions and MIPs were obtained to evaluate the vascular anatomy. Carotid stenosis measurements (when applicable) are obtained utilizing NASCET criteria, using the distal internal carotid diameter as the denominator. CONTRAST:  9m OMNIPAQUE IOHEXOL 350 MG/ML SOLN COMPARISON:  CT head 09/09/2019 FINDINGS: CTA NECK FINDINGS Aortic arch: Standard branching. Imaged portion shows no evidence of aneurysm or dissection. No significant stenosis of the major arch vessel origins. Right carotid system: Heavily calcified plaque right carotid bifurcation. Right internal carotid artery lumen narrowed by 60% diameter stenosis. Moderate narrowing of the proximal right external carotid artery. Left carotid system: Mild atherosclerotic disease left carotid bulb without significant stenosis. Vertebral arteries: Both vertebral arteries patent to the basilar. Mild stenosis at the skull base otherwise widely patent Skeleton: No acute skeletal abnormality. Ossification in the ligamentum flavum on the right at C6-7 could represent a meningioma. This measures approximately 6 x 10 mm. Other neck: Negative for mass or adenopathy. The patient is intubated. Upper chest: Lung apices clear bilaterally. Review of the MIP images confirms the above findings CTA HEAD FINDINGS Anterior circulation: Extensive calcification in the cavernous carotid bilaterally. Moderate stenosis bilaterally. Anterior and middle cerebral arteries patent bilaterally. Mild stenosis right M1 segment. Left middle cerebral artery patent without stenosis. Anterior cerebral arteries patent bilaterally.  Posterior circulation: Atherosclerotic calcification and mild stenosis in both vertebral arteries at the skull base. Both vertebral arteries patent to the basilar. PICA patent bilaterally. Basilar widely patent. Superior cerebellar and posterior cerebral arteries patent bilaterally without significant stenosis. Venous sinuses: Normal venous enhancement. Anatomic variants: None Review of the MIP images confirms the above findings IMPRESSION: 1. 60% diameter stenosis proximal right internal carotid artery due to heavily calcified plaque. Moderate stenosis right cavernous carotid. 2. Mild atherosclerotic calcification left carotid bulb without stenosis. Moderate stenosis left cavernous carotid due to calcific stenosis. 3. Mild stenosis of the vertebral artery bilaterally at the skull base otherwise clear 4. No intracranial large vessel occlusion. Mild stenosis right M1 segment. 5. 6 x 10 mm ossification ligamentum flavum on the right at C6-7 may represent meningioma. Electronically Signed   By: CFranchot GalloM.D.   On: 09/09/2019 21:15   Dg Abd 1 View  Result Date: 09/11/2019 CLINICAL DATA:  Vomiting while intubated EXAM: ABDOMEN - 1 VIEW COMPARISON:  Yesterday FINDINGS: Normal bowel gas pattern. The enteric tube tip is at the mid to distal stomach. No concerning mass effect or gas collection. Lung bases are clear. IMPRESSION: 1. Normal bowel gas pattern. 2. Orogastric tube in good position. Electronically Signed   By: JMonte FantasiaM.D.   On: 09/11/2019 09:36   Ct Head Wo Contrast  Result Date: 09/09/2019 CLINICAL DATA:  Altered mental status, found down EXAM: CT HEAD WITHOUT CONTRAST TECHNIQUE: Contiguous axial images were obtained from the base of the skull through the vertex without intravenous contrast. COMPARISON:  11/27/2013 FINDINGS: Brain: No evidence of acute infarction, hemorrhage, hydrocephalus, extra-axial collection or mass lesion/mass effect. Vascular: Mild atherosclerotic calcifications  involving the large vessels of the skull base. No unexpected hyperdense vessel. Skull: Normal. Negative for fracture or focal lesion. Sinuses/Orbits: No acute finding. Other: None. IMPRESSION: No acute intracranial findings. Electronically Signed   By: NDavina PokeM.D.   On: 09/09/2019  19:15   Ct Angio Neck W Or Wo Contrast  Result Date: 09/09/2019 CLINICAL DATA:  Unresponsive. EXAM: CT ANGIOGRAPHY HEAD AND NECK TECHNIQUE: Multidetector CT imaging of the head and neck was performed using the standard protocol during bolus administration of intravenous contrast. Multiplanar CT image reconstructions and MIPs were obtained to evaluate the vascular anatomy. Carotid stenosis measurements (when applicable) are obtained utilizing NASCET criteria, using the distal internal carotid diameter as the denominator. CONTRAST:  12m OMNIPAQUE IOHEXOL 350 MG/ML SOLN COMPARISON:  CT head 09/09/2019 FINDINGS: CTA NECK FINDINGS Aortic arch: Standard branching. Imaged portion shows no evidence of aneurysm or dissection. No significant stenosis of the major arch vessel origins. Right carotid system: Heavily calcified plaque right carotid bifurcation. Right internal carotid artery lumen narrowed by 60% diameter stenosis. Moderate narrowing of the proximal right external carotid artery. Left carotid system: Mild atherosclerotic disease left carotid bulb without significant stenosis. Vertebral arteries: Both vertebral arteries patent to the basilar. Mild stenosis at the skull base otherwise widely patent Skeleton: No acute skeletal abnormality. Ossification in the ligamentum flavum on the right at C6-7 could represent a meningioma. This measures approximately 6 x 10 mm. Other neck: Negative for mass or adenopathy. The patient is intubated. Upper chest: Lung apices clear bilaterally. Review of the MIP images confirms the above findings CTA HEAD FINDINGS Anterior circulation: Extensive calcification in the cavernous carotid  bilaterally. Moderate stenosis bilaterally. Anterior and middle cerebral arteries patent bilaterally. Mild stenosis right M1 segment. Left middle cerebral artery patent without stenosis. Anterior cerebral arteries patent bilaterally. Posterior circulation: Atherosclerotic calcification and mild stenosis in both vertebral arteries at the skull base. Both vertebral arteries patent to the basilar. PICA patent bilaterally. Basilar widely patent. Superior cerebellar and posterior cerebral arteries patent bilaterally without significant stenosis. Venous sinuses: Normal venous enhancement. Anatomic variants: None Review of the MIP images confirms the above findings IMPRESSION: 1. 60% diameter stenosis proximal right internal carotid artery due to heavily calcified plaque. Moderate stenosis right cavernous carotid. 2. Mild atherosclerotic calcification left carotid bulb without stenosis. Moderate stenosis left cavernous carotid due to calcific stenosis. 3. Mild stenosis of the vertebral artery bilaterally at the skull base otherwise clear 4. No intracranial large vessel occlusion. Mild stenosis right M1 segment. 5. 6 x 10 mm ossification ligamentum flavum on the right at C6-7 may represent meningioma. Electronically Signed   By: CFranchot GalloM.D.   On: 09/09/2019 21:15   Ct Chest Wo Contrast  Result Date: 09/09/2019 CLINICAL DATA:  68year old female with shortness of breath. Patient was found on the floor. EXAM: CT CHEST, ABDOMEN AND PELVIS WITHOUT CONTRAST TECHNIQUE: Multidetector CT imaging of the chest, abdomen and pelvis was performed following the standard protocol without IV contrast. COMPARISON:  CT of the abdomen pelvis dated 12/26/2017 FINDINGS: Evaluation of this exam is limited in the absence of intravenous contrast. Evaluation is also limited due to respiratory motion artifact as well as streak artifact caused by patient's arms. CT CHEST FINDINGS Cardiovascular: There is no cardiomegaly or pericardial  effusion. There is coronary vascular calcification. The thoracic aorta and central pulmonary arteries are grossly unremarkable on this noncontrast CT. Mediastinum/Nodes: No hilar or mediastinal adenopathy. The esophagus and the thyroid gland are grossly unremarkable. No mediastinal fluid collection. Lungs/Pleura: Evaluation of the lungs is somewhat limited due to respiratory motion artifact. Focal area of increased hazy density at the left lower lobe (series 4, image 79 and coronal series 5, image 61) may be artifactual and represent respiratory motion. Developing infiltrate  is not excluded. Clinical correlation is recommended. No focal consolidation, pleural effusion, or pneumothorax. Mucus secretions noted within the trachea. The central airways otherwise remain patent. Musculoskeletal: No chest wall mass or suspicious bone lesions identified. CT ABDOMEN PELVIS FINDINGS No intra-abdominal free air or free fluid. Hepatobiliary: The liver is grossly unremarkable as visualized. No intrahepatic biliary ductal dilatation. There is layering stone and sludge within the gallbladder. Evaluation of the gallbladder is otherwise very limited due to respiratory motion and streak artifact caused by patient's arms. Ultrasound is recommended for better evaluation of the gallbladder. Pancreas: The pancreas is grossly unremarkable as visualized. Spleen: Normal in size without focal abnormality. Adrenals/Urinary Tract: The adrenal glands are unremarkable. There is no hydronephrosis or nephrolithiasis on either side. Subcentimeter left renal upper pole hypodense lesions are not characterized. The visualized ureters and urinary bladder appear unremarkable. Stomach/Bowel: Mild diffuse thickened appearance of the colon likely related to underdistention and respiratory motion artifact. Mild colitis is not entirely excluded. Clinical correlation is recommended. There is no bowel obstruction. The appendix is unremarkable as visualized.  Vascular/Lymphatic: Mild aortoiliac atherosclerotic disease. The IVC is unremarkable. No portal venous gas. There is no adenopathy. Reproductive: The uterus is suboptimally visualized, hysterectomy. Other: Midline vertical anterior pelvic wall incisional scar. Musculoskeletal: Degenerative changes of the spine. No acute osseous pathology. IMPRESSION: 1. Respiratory motion artifact versus less likely developing infiltrate in the left lower lobe. Clinical correlation is recommended. 2. Artifact versus mild colitis. Clinical correlation is recommended. No bowel obstruction. 3. Cholelithiasis. Ultrasound is recommended for better evaluation of the gallbladder. Aortic Atherosclerosis (ICD10-I70.0). Electronically Signed   By: Anner Crete M.D.   On: 09/09/2019 19:31   Mr Brain Wo Contrast  Result Date: 09/10/2019 CLINICAL DATA:  Initial evaluation for acute encephalopathy, unclear cause. EXAM: MRI HEAD WITHOUT CONTRAST TECHNIQUE: Multiplanar, multiecho pulse sequences of the brain and surrounding structures were obtained without intravenous contrast. COMPARISON:  Prior CT and CTA from 09/09/2019. FINDINGS: Brain: Examination mildly degraded by motion artifact. Cerebral volume within normal limits for age. No significant cerebral white matter changes. Probable single tiny remote lacunar infarct noted at the posterior right internal capsule. No abnormal foci of restricted diffusion to suggest acute or subacute ischemia. Gray-white matter differentiation maintained. No encephalomalacia to suggest chronic cortical infarction. No evidence for acute or chronic intracranial hemorrhage. No mass lesion, midline shift, or mass effect. No hydrocephalus. No extra-axial fluid collection. Normal pituitary gland. Patchy multifocal somewhat rounded foci of T2/FLAIR signal abnormality seen involving the cerebellum bilaterally, right greater than left (series 18, images 7, 6, 5). Additional focal signal abnormality seen at the  left occipital pole (series 18, image 9). Possible mild involvement more superiorly at the right parieto-occipital region (series 18, image 16). No associated hemorrhage or significant mass effect. Relative sparing of the deep gray nuclei and brainstem. Vascular: Major intracranial vascular flow voids are maintained. Skull and upper cervical spine: Craniocervical junction within normal limits. Upper cervical spine normal. Bone marrow signal intensity normal. No scalp soft tissue abnormality. Sinuses/Orbits: Patient status post bilateral ocular lens replacement. Scattered mucosal thickening noted throughout the paranasal sinuses. Endotracheal and enteric tubes partially visualized, with the enteric tube partially seen coiled within the naso/oropharynx. No significant mastoid effusion. Inner ear structures grossly normal. Other: None. IMPRESSION: 1. Patchy multifocal T2/FLAIR signal abnormality involving the cerebellum bilaterally and left occipital pole. Findings are nonspecific, but could reflect sequelae of PRES. Nonspecific toxic metabolic derangement would be the primary differential consideration. Correlation with history and laboratory values  recommended. 2. No other acute intracranial abnormality. Electronically Signed   By: Jeannine Boga M.D.   On: 09/10/2019 03:59   Dg Chest Bilateral Decubitus  Result Date: 09/14/2019 CLINICAL DATA:  SOB; previously intubated EXAM: CHEST - BILATERAL DECUBITUS VIEW COMPARISON:  Radiograph 9/25 2020, CT 09/09/2019 FINDINGS: LEFT lateral decubitus and RIGHT lateral decubitus views of the thorax demonstrate no pneumothorax. No pleural effusion. IMPRESSION: No pneumothorax or pleural effusion. Electronically Signed   By: Suzy Bouchard M.D.   On: 09/14/2019 18:32   US Abdomen Limited  Result Date: 09/10/2019 CLINICAL DATA:  Gallbladder sludge EXAM: ULTRASOUND ABDOMEN LIMITED RIGHT UPPER QUADRANT COMPARISON:  Abdominal CT from yesterday FINDINGS: Gallbladder:  Layering material within the gallbladder consistent with sludge. There also rounded areas measuring up to 6 mm compatible with calculi. No wall thickening or focal tenderness, although the gallbladder is full. Common bile duct: Diameter: 6 mm.  Where visualized, no filling defect. Liver: No focal lesion identified. Within normal limits in parenchymal echogenicity. Portal vein is patent on color Doppler imaging with normal direction of blood flow towards the liver. IMPRESSION: Gallbladder sludge with probable small nonshadowing calculi. No evidence of cholecystitis or choledocholithiasis. Electronically Signed   By: Monte Fantasia M.D.   On: 09/10/2019 05:47   Dg Chest Port 1 View  Result Date: 09/13/2019 CLINICAL DATA:  Respiratory failure EXAM: PORTABLE CHEST 1 VIEW COMPARISON:  Yesterday FINDINGS: Endotracheal tube tip between the clavicular heads and carina. The orogastric tube reaches the stomach. There is no edema, consolidation, effusion, or pneumothorax. Normal heart size and mediastinal contours. IMPRESSION: Unremarkable hardware positioning.  No evidence of acute disease. Electronically Signed   By: Monte Fantasia M.D.   On: 09/13/2019 08:03   Dg Chest Port 1 View  Result Date: 09/12/2019 CLINICAL DATA:  Respiratory failure, endotracheal tube. EXAM: PORTABLE CHEST 1 VIEW COMPARISON:  09/11/2019 FINDINGS: Heart size is stable. Endotracheal tube remains in place just below the clavicular heads. Cardiomediastinal contours are stable. Lungs are clear. No acute bone finding. Enteric tube present in left upper quadrant. IMPRESSION: ET tube remains in place, no acute cardiopulmonary process. Electronically Signed   By: Zetta Bills M.D.   On: 09/12/2019 08:55   Dg Chest Port 1 View  Result Date: 09/11/2019 CLINICAL DATA:  Respiratory failure EXAM: PORTABLE CHEST 1 VIEW COMPARISON:  Two days ago FINDINGS: Endotracheal tube tip between the clavicular heads and carina. The orogastric tube reaches the  stomach. There is no edema, consolidation, effusion, or pneumothorax. Normal heart size and mediastinal contours. IMPRESSION: No evidence of active disease. Unremarkable hardware positioning. Electronically Signed   By: Monte Fantasia M.D.   On: 09/11/2019 07:04   Dg Chest Portable 1 View  Result Date: 09/09/2019 CLINICAL DATA:  Intubation. EXAM: PORTABLE CHEST 1 VIEW COMPARISON:  Radiographs and CT earlier this day. FINDINGS: Endotracheal tube tip 5.4 cm from the carina at the thoracic inlet. The cardiomediastinal contours are normal. No definite consolidation at the left lung base. Pulmonary vasculature is normal. No confluent consolidation, pleural effusion, or pneumothorax. No acute osseous abnormalities are seen. Overlying artifact obscures the right costophrenic angle. Presumed external artifact in the right supraclavicular region. IMPRESSION: 1. Endotracheal tube tip 5.4 cm from the carina at the thoracic inlet. 2. No acute chest findings. Electronically Signed   By: Keith Rake M.D.   On: 09/09/2019 22:50   Dg Chest Port 1 View  Result Date: 09/09/2019 CLINICAL DATA:  Altered mental status, fever EXAM: PORTABLE CHEST 1 VIEW  COMPARISON:  01/03/2013 FINDINGS: The heart size and mediastinal contours are within normal limits. No focal airspace consolidation. No pleural effusion. No pneumothorax. IMPRESSION: No acute cardiopulmonary findings. Electronically Signed   By: Davina Poke M.D.   On: 09/09/2019 17:10   Dg Abd Portable 1v  Result Date: 09/10/2019 CLINICAL DATA:  Status post OG tube placement. EXAM: PORTABLE ABDOMEN - 1 VIEW COMPARISON:  None. FINDINGS: OG tube is in place in good position with both the tip and side-port in the stomach. IMPRESSION: OG tube in good position. Electronically Signed   By: Inge Rise M.D.   On: 09/10/2019 12:45   Dg Abd Portable 1v  Result Date: 09/10/2019 CLINICAL DATA:  Orogastric tube placement EXAM: PORTABLE ABDOMEN - 1 VIEW COMPARISON:   Portable exam 0754 hours compared to CT abdomen and pelvis 09/09/2019 FINDINGS: No orogastric tube identified. Excreted contrast identified within a mildly dilated RIGHT renal collecting system. Lung bases clear. Paucity of bowel gas. Bones demineralized. IMPRESSION: No orogastric tube identified - patient's nurse in ICU notified at 0829 hours. Mild collecting system dilatation of the RIGHT kidney new since prior CT. Electronically Signed   By: Lavonia Dana M.D.   On: 09/10/2019 08:30   Dg Swallowing Func-speech Pathology  Result Date: 09/14/2019 Objective Swallowing Evaluation: Type of Study: MBS-Modified Barium Swallow Study  Patient Details Name: RAAGA MAEDER MRN: [id number] Date of Birth: [birth year] Today's Date: 09/14/2019 Time: SLP Start Time (ACUTE ONLY): 61 -SLP Stop Time (ACUTE ONLY): 1150 SLP Time Calculation (min) (ACUTE ONLY): 20 min Past Medical History: Past Medical History: Diagnosis Date  Allergy   SEASONAL  Anemia   Arthritis   Cataract   PRESENT IN LEFT/ CATARACTS REMOVED FROM RIGHT  Diabetes mellitus 1996  DKA (diabetic ketoacidoses) ([place]) 01/02/2013  Hyperlipidemia   Hypertension   Hypotension   Influenza A (H1N1) 01/03/2013  Neuromuscular disorder ([place])   NEUROPATHY  SYNCOPE 05/10/2010  Qualifier: Diagnosis of  By: Marilynne Halsted RN, BSN, Jacquelyn   Past Surgical History: Past Surgical History: Procedure Laterality Date  ABDOMINAL HYSTERECTOMY    10 years ago   BREAST BIOPSY    Cataracts    ROTATOR CUFF REPAIR    SHOULDER SURGERY    Rotator cuff tear  HPI: Pt is a 68 yo female presenting with AMS. 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 Pt required intubation 9/21-9/25. PMH: DM, HTN, HLD  Subjective: alert, pleasant, confused Assessment / Plan / Recommendation CHL IP CLINICAL IMPRESSIONS 09/14/2019 Clinical Impression Pt has a moderate oropharyngeal dysphagia that is felt to be a  combination of prolonged intubation, baseline structural issues, and current AMS. She has slow posterior propulsion of boluses orally, often allowing entire boluses of puree to sit in her vallecular space as she takes additional bites in her mouth. SLP provided cues to swallow volitionally, which pt then performed swiftly. Her base of tongue retraction and epiglottic inversion are reduced, resulting in mild vallecular residue and intermittent penetration of all liquids. This penetration for the most part is in small amounts and more shallow in the laryngeal vestibule. Pt also has suspected osteophytes at the level of the UES, which in conjunction with reduced pharyngeal strength cause reduced UES opening and pyriform sinus residue. Residue from the pyriform sinuses spills posteriorly over the arytenoids and down the trachea. It is clearly observed happening with thin liquids. With other consistencies it is not always observed while fluoro  was on, but barium was noted along the posterior trachea after imaging resumed. A chin tuck helped to increase safety and efficiency with spoonfuls of honey, but pt had a hard time cognitively maintaining this posture. Upon completion of testing after moving back to bed, pt had a prolonged coughing episode with audibly wet vocal quality. Mod cues were given to cough and pt ultimately expectorated barium. Recommend that she remin NPO except for meds crushed in puree and ice chips after oral care. Although some issues are likely more baseline, suspect the will improve with improved mentation, restrengthening, and additional time post-extubation. SLP Visit Diagnosis Dysphagia, pharyngoesophageal phase (R13.14);Dysphagia, oropharyngeal phase (R13.12) Attention and concentration deficit following -- Frontal lobe and executive function deficit following -- Impact on safety and function Moderate aspiration risk   CHL IP TREATMENT RECOMMENDATION 09/14/2019 Treatment Recommendations Therapy  as outlined in treatment plan below   Prognosis 09/14/2019 Prognosis for Safe Diet Advancement Good Barriers to Reach Goals Cognitive deficits Barriers/Prognosis Comment -- CHL IP DIET RECOMMENDATION 09/14/2019 SLP Diet Recommendations NPO except meds;Ice chips PRN after oral care Liquid Administration via -- Medication Administration Crushed with puree Compensations Slow rate;Small sips/bites Postural Changes --   CHL IP OTHER RECOMMENDATIONS 09/14/2019 Recommended Consults -- Oral Care Recommendations Oral care QID Other Recommendations Have oral suction available   CHL IP FOLLOW UP RECOMMENDATIONS 09/14/2019 Follow up Recommendations (No Data)   CHL IP FREQUENCY AND DURATION 09/14/2019 Speech Therapy Frequency (ACUTE ONLY) min 2x/week Treatment Duration 2 weeks      CHL IP ORAL PHASE 09/14/2019 Oral Phase Impaired Oral - Pudding Teaspoon -- Oral - Pudding Cup -- Oral - Honey Teaspoon Decreased bolus cohesion Oral - Honey Cup Decreased bolus cohesion Oral - Nectar Teaspoon -- Oral - Nectar Cup Decreased bolus cohesion Oral - Nectar Straw -- Oral - Thin Teaspoon Decreased bolus cohesion Oral - Thin Cup Decreased bolus cohesion Oral - Thin Straw -- Oral - Puree Decreased bolus cohesion Oral - Mech Soft -- Oral - Regular -- Oral - Multi-Consistency -- Oral - Pill -- Oral Phase - Comment --  CHL IP PHARYNGEAL PHASE 09/14/2019 Pharyngeal Phase Impaired Pharyngeal- Pudding Teaspoon -- Pharyngeal -- Pharyngeal- Pudding Cup -- Pharyngeal -- Pharyngeal- Honey Teaspoon Reduced tongue base retraction;Reduced airway/laryngeal closure;Reduced epiglottic inversion;Pharyngeal residue - valleculae;Pharyngeal residue - pyriform;Penetration/Apiration after swallow Pharyngeal Material enters airway, remains ABOVE vocal cords and not ejected out Pharyngeal- Honey Cup Reduced tongue base retraction;Reduced airway/laryngeal closure;Reduced epiglottic inversion;Pharyngeal residue - valleculae;Pharyngeal residue - pyriform;Penetration/Apiration  after swallow Pharyngeal Material enters airway, remains ABOVE vocal cords and not ejected out Pharyngeal- Nectar Teaspoon -- Pharyngeal -- Pharyngeal- Nectar Cup Reduced tongue base retraction;Reduced airway/laryngeal closure;Reduced epiglottic inversion;Pharyngeal residue - valleculae;Pharyngeal residue - pyriform;Penetration/Apiration after swallow Pharyngeal Material enters airway, passes BELOW cords and not ejected out despite cough attempt by patient Pharyngeal- Nectar Straw -- Pharyngeal -- Pharyngeal- Thin Teaspoon Reduced tongue base retraction;Reduced airway/laryngeal closure;Reduced epiglottic inversion;Pharyngeal residue - valleculae;Pharyngeal residue - pyriform Pharyngeal -- Pharyngeal- Thin Cup Reduced tongue base retraction;Reduced airway/laryngeal closure;Reduced epiglottic inversion;Pharyngeal residue - valleculae;Pharyngeal residue - pyriform;Penetration/Apiration after swallow Pharyngeal Material enters airway, passes BELOW cords and not ejected out despite cough attempt by patient Pharyngeal- Thin Straw -- Pharyngeal -- Pharyngeal- Puree Reduced tongue base retraction;Reduced airway/laryngeal closure;Reduced epiglottic inversion;Pharyngeal residue - valleculae;Pharyngeal residue - pyriform;Penetration/Apiration after swallow Pharyngeal Material enters airway, remains ABOVE vocal cords and not ejected out Pharyngeal- Mechanical Soft -- Pharyngeal -- Pharyngeal- Regular -- Pharyngeal -- Pharyngeal- Multi-consistency -- Pharyngeal -- Pharyngeal- Pill -- Pharyngeal --  Pharyngeal Comment --  CHL IP CERVICAL ESOPHAGEAL PHASE 09/14/2019 Cervical Esophageal Phase Impaired Pudding Teaspoon -- Pudding Cup -- Honey Teaspoon Reduced cricopharyngeal relaxation Honey Cup Reduced cricopharyngeal relaxation Nectar Teaspoon -- Nectar Cup Reduced cricopharyngeal relaxation Nectar Straw -- Thin Teaspoon Reduced cricopharyngeal relaxation Thin Cup Reduced cricopharyngeal relaxation Thin Straw -- Puree Reduced  cricopharyngeal relaxation Mechanical Soft -- Regular -- Multi-consistency -- Pill -- Cervical Esophageal Comment -- Venita Sheffield Nix 09/14/2019, 12:36 PM  Pollyann Glen, M.A. [facility] Pager [phone] Office [phone]              Orson Eva, DO  [facility] Pager [phone]  If 7PM-7AM, please contact night-coverage [email or web address] Password [facility] 09/15/2019, 8:32 AM   LOS: 6 days

## 2019-09-15 NOTE — Progress Notes (Signed)
Rehab Admissions Coordinator Note:  Patient was screened by Cleatrice Burke for appropriateness for an Inpatient Acute Rehab Consult per PT recs..  At this time, we are recommending Inpatient Rehab consult. Please place order for consult.  Amanda [name], Amanda [name] 09/15/2019, 3:30 PM  I can be reached at [phone].

## 2019-09-15 NOTE — Progress Notes (Signed)
Clinically improving. Neurology will sign off. Please see my prior notes regarding the DDx for the MRI findings and her encephalopathy.   Electronically signed: Dr. Kerney Elbe

## 2019-09-15 NOTE — Progress Notes (Signed)
Inpatient Diabetes Program Recommendations  AACE/ADA: New Consensus Statement on Inpatient Glycemic Control (2015)  Target Ranges:  Prepandial:   less than 140 mg/dL      Peak postprandial:   less than 180 mg/dL (1-2 hours)      Critically ill patients:  140 - 180 mg/dL   Lab Results  Component Value Date   GLUCAP 200 (H) 09/15/2019   HGBA1C 10.9 (H) 09/09/2019    Review of Glycemic Control  AM blood sugars 189, 200 mg/dL.  May benefit from adding levemir 10 units.  Recommendations:  Add Levemir 10 units Q24H.  Will continue to follow.  Thank you. Lorenda Peck, RD, LDN, CDE Inpatient Diabetes Coordinator [phone]

## 2019-09-16 ENCOUNTER — Encounter: Payer: Medicare Other | Admitting: Internal Medicine

## 2019-09-16 DIAGNOSIS — N182 Chronic kidney disease, stage 2 (mild): Secondary | ICD-10-CM

## 2019-09-16 LAB — CBC
HCT: 24.3 % — ABNORMAL LOW (ref 36.0–46.0)
Hemoglobin: 8 g/dL — ABNORMAL LOW (ref 12.0–15.0)
MCH: 30.7 pg (ref 26.0–34.0)
MCHC: 32.9 g/dL (ref 30.0–36.0)
MCV: 93.1 fL (ref 80.0–100.0)
Platelets: 245 10*3/uL (ref 150–400)
RBC: 2.61 MIL/uL — ABNORMAL LOW (ref 3.87–5.11)
RDW: 13.1 % (ref 11.5–15.5)
WBC: 8.8 10*3/uL (ref 4.0–10.5)
nRBC: 0.2 % (ref 0.0–0.2)

## 2019-09-16 LAB — COMPREHENSIVE METABOLIC PANEL
ALT: 32 U/L (ref 0–44)
AST: 29 U/L (ref 15–41)
Albumin: 2.2 g/dL — ABNORMAL LOW (ref 3.5–5.0)
Alkaline Phosphatase: 54 U/L (ref 38–126)
Anion gap: 4 — ABNORMAL LOW (ref 5–15)
BUN: 20 mg/dL (ref 8–23)
CO2: 21 mmol/L — ABNORMAL LOW (ref 22–32)
Calcium: 8.1 mg/dL — ABNORMAL LOW (ref 8.9–10.3)
Chloride: 121 mmol/L — ABNORMAL HIGH (ref 98–111)
Creatinine, Ser: 1.28 mg/dL — ABNORMAL HIGH (ref 0.44–1.00)
GFR calc Af Amer: 50 mL/min — ABNORMAL LOW (ref >60)
GFR calc non Af Amer: 43 mL/min — ABNORMAL LOW (ref >60)
Glucose, Bld: 107 mg/dL — ABNORMAL HIGH (ref 70–99)
Potassium: 3.6 mmol/L (ref 3.5–5.1)
Sodium: 146 mmol/L — ABNORMAL HIGH (ref 135–145)
Total Bilirubin: 0.5 mg/dL (ref 0.3–1.2)
Total Protein: 5 g/dL — ABNORMAL LOW (ref 6.5–8.1)

## 2019-09-16 LAB — URINE CULTURE: Culture: NO GROWTH

## 2019-09-16 LAB — GLUCOSE, CAPILLARY
Glucose-Capillary: 107 mg/dL — ABNORMAL HIGH (ref 70–99)
Glucose-Capillary: 130 mg/dL — ABNORMAL HIGH (ref 70–99)
Glucose-Capillary: 145 mg/dL — ABNORMAL HIGH (ref 70–99)
Glucose-Capillary: 206 mg/dL — ABNORMAL HIGH (ref 70–99)
Glucose-Capillary: 234 mg/dL — ABNORMAL HIGH (ref 70–99)

## 2019-09-16 LAB — PROCALCITONIN: Procalcitonin: 4.25 ng/mL

## 2019-09-16 LAB — CK: Total CK: 309 U/L — ABNORMAL HIGH (ref 38–234)

## 2019-09-16 MED ORDER — RESOURCE THICKENUP CLEAR PO POWD
Freq: Once | ORAL | Status: AC
  Administered 2019-09-16: 17:00:00 via ORAL
  Filled 2019-09-16: qty 125

## 2019-09-16 MED ORDER — SODIUM CHLORIDE 0.9 % IV SOLN
INTRAVENOUS | Status: DC
  Administered 2019-09-16 – 2019-09-17 (×2): via INTRAVENOUS

## 2019-09-16 MED ORDER — AMLODIPINE BESYLATE 5 MG PO TABS
5.0000 mg | ORAL_TABLET | Freq: Every day | ORAL | Status: DC
  Administered 2019-09-16 – 2019-09-20 (×5): 5 mg via ORAL
  Filled 2019-09-16 (×5): qty 1

## 2019-09-16 MED ORDER — ENSURE ENLIVE PO LIQD
237.0000 mL | Freq: Two times a day (BID) | ORAL | Status: DC
  Administered 2019-09-17 – 2019-09-24 (×16): 237 mL via ORAL

## 2019-09-16 MED ORDER — LORAZEPAM 2 MG/ML IJ SOLN
1.0000 mg | Freq: Once | INTRAMUSCULAR | Status: AC
  Administered 2019-09-16: 01:00:00 1 mg via INTRAVENOUS
  Filled 2019-09-16: qty 1

## 2019-09-16 NOTE — Evaluation (Addendum)
Occupational Therapy Evaluation Patient Details Name: Amanda [name] MRN: [id number] DOB: October 29, 1951 Today's Date: 09/16/2019    History of Present Illness Pt is a 68 yo female presenting with AMS. 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 Pt required intubation 9/21-9/25. PMH: DM, HTN, HLD   Clinical Impression   This 68 yo female admitted with above presents to acute OT with decreased balance, decreased mobility, increased pain thus affecting her ability to care for herself much less care for her husband. She will benefit from acute OT with follow up OT on CIR.  Upon first sitting up pt reported dizziness when asked BP 154/80, stood up and pt reported increased dizziness BP 138/71 (in sitting due to pt reported she was too dizzy to stay standing). Let pt sit for 5 minutes doing ankle pumps and leg kicks then went from bed to recliner 147/76 once she sat down will mild reports of dizziness.    Follow Up Recommendations  CIR;Supervision/Assistance - 24 hour    Equipment Recommendations  Other (comment)(TBD next venue)       Precautions / Restrictions Precautions Precautions: Fall Restrictions Weight Bearing Restrictions: No      Mobility Bed Mobility Overal bed mobility: Needs Assistance Bed Mobility: Supine to Sit;Sit to Supine     Supine to sit: Min assist(trunk) Sit to supine: Min assist(legs)      Transfers Overall transfer level: Needs assistance Equipment used: Rolling walker (2 wheeled) Transfers: Sit to/from Stand Sit to Stand: Min assist         General transfer comment: Cues for safe hand placement and increased time    Balance Overall balance assessment: Needs assistance Sitting-balance support: No upper extremity supported;Feet supported Sitting balance-Leahy Scale: Fair     Standing balance support: Bilateral upper extremity supported Standing  balance-Leahy Scale: Poor                             ADL either performed or assessed with clinical judgement   ADL Overall ADL's : Needs assistance/impaired Eating/Feeding: Independent;Bed level Eating/Feeding Details (indicate cue type and reason): drinking thickened liquids HOB up Grooming: Set up;Supervision/safety;Sitting   Upper Body Bathing: Supervision/ safety;Set up;Sitting Upper Body Bathing Details (indicate cue type and reason): No issues with arm movements or sitting balance Lower Body Bathing: Minimal assistance;Sit to/from stand   Upper Body Dressing : Set up;Supervision/safety Upper Body Dressing Details (indicate cue type and reason): No issues with arm movements or sitting balance Lower Body Dressing: Minimal assistance;Sit to/from stand   Toilet Transfer: Minimal assistance;Ambulation;RW Toilet Transfer Details (indicate cue type and reason): bed>recliner>bed Toileting- Clothing Manipulation and Hygiene: Minimal assistance;Sit to/from stand               Vision Patient Visual Report: No change from baseline              Pertinent Vitals/Pain Pain Assessment: Faces Faces Pain Scale: Hurts little more Pain Location: headache (that started after she fell eariler today) Pain Descriptors / Indicators: Aching Pain Intervention(s): Limited activity within patient's tolerance;Monitored during session(made RN aware)     Hand Dominance Right   Extremity/Trunk Assessment Upper Extremity Assessment Upper Extremity Assessment: Overall WFL for tasks assessed           Communication Communication Communication: No difficulties   Cognition Arousal/Alertness: Awake/alert Behavior During Therapy: WFL for tasks assessed/performed Overall Cognitive  Status: Within Functional Limits for tasks assessed                               Problem Solving: Slow processing General Comments: Pt was fully oriented today. She fell eariler today  reporting that she hit her head when she fell. Pt reports someone was with her when she fell but dtr reports she was told that no one was with her when she fell. Appartently when talking to RN bed alarm went off, staff went immediately to room but pt had already fallen.              Home Living Family/patient expects to be discharged to:: Inpatient rehab Living Arrangements: Spouse/significant other Available Help at Discharge: Family;Other (Comment)(pt is caregiver for husband who is under pallative care services) Type of Home: House Home Access: Stairs to enter           [employer] Shower/Tub: Walk-in shower                    Prior Functioning/Environment Level of Independence: Independent                 OT Problem List: Decreased strength;Impaired balance (sitting and/or standing);Pain      OT Treatment/Interventions: Self-care/ADL training;DME and/or AE instruction;Patient/family education;Balance training    OT Goals(Current goals can be found in the care plan section) Acute Rehab OT Goals OT Goal Formulation: With patient Time For Goal Achievement: 09/30/19 Potential to Achieve Goals: Good  OT Frequency: Min 2X/week   Barriers to D/C: Decreased caregiver support  main caregiver for husband and dtr not totally sure there can be somewhere with pt 24/7          AM-PAC OT "6 Clicks" Daily Activity     Outcome Measure Help from another person eating meals?: A Little(S due to swallowing precautions) Help from another person taking care of personal grooming?: A Little Help from another person toileting, which includes using toliet, bedpan, or urinal?: A Lot Help from another person bathing (including washing, rinsing, drying)?: A Lot Help from another person to put on and taking off regular upper body clothing?: A Little Help from another person to put on and taking off regular lower body clothing?: A Lot 6 Click Score: 15   End of Session Equipment  Utilized During Treatment: Gait belt;Rolling walker Nurse Communication: Louie Casa: no report in chart that patient fell, dtr informed me so I called RN to ask.RN reported safety zone completed. Also pt reported to me that she has a mild headache, that she did hit her head when she fell and headache started after she fell.)  Activity Tolerance: Patient tolerated treatment well Patient left: in bed;with call bell/phone within reach;with bed alarm set;with family/visitor present(dtr)  OT Visit Diagnosis: Unsteadiness on feet (R26.81);Other abnormalities of gait and mobility (R26.89);Pain Pain - part of body: (head ache)                Time: 7829-5621 OT Time Calculation (min): 48 min Charges:  OT General Charges $OT Visit: 1 Visit OT Evaluation $OT Eval Moderate Complexity: 1 Mod OT Treatments $Self Care/Home Management : 23-37 mins  [address] Circle, OTR/L Acute [employer] Pager [phone] Office [phone]     Almon Register 09/16/2019, 5:19 PM

## 2019-09-16 NOTE — Progress Notes (Signed)
  Speech Language Pathology Treatment: Dysphagia  Patient Details Name: Amanda [name] MRN: [id number] DOB: 1951-01-21 Today's Date: 09/16/2019 Time: 0177-9390 SLP Time Calculation (min) (ACUTE ONLY): 31 min  Assessment / Plan / Recommendation Clinical Impression  Pt followed for dysphagia.  New orders received for swallow assessment.Today, mentation is better per progress notes. Pt shows marginal improvements compared to described function this weekend.  She continues to demonstrate multiple sub-swallows per bolus, less coughing - but still present- with thin liquids and crackers.  Nectar thick liquids did not elicit cough at bedside.  Pt able to self-feed with cautious pacing.  Per MBS on Saturday, presence of osteophytes and generalized weakness led to significant residue in pyriforms - residue tended to spill into airway after the swallow and was generally accompanied by a cough response.  Recommend beginning a dysphagia 1 diet with nectar thick liquids; crush meds.  Please provide full supervision and assistance with self-feeding. If pt is coughing, hold tray.  SLP will continue to follow.     HPI HPI: Pt is a 68 yo female presenting with AMS. 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 Pt required intubation 9/21-9/25. PMH: DM, HTN, HLD      SLP Plan  Continue with current plan of care       Recommendations  Diet recommendations: Dysphagia 1 (puree);Nectar-thick liquid Liquids provided via: Cup;Straw Medication Administration: Crushed with puree Supervision: Staff to assist with self feeding Compensations: Slow rate;Small sips/bites Postural Changes and/or Swallow Maneuvers: Seated upright 90 degrees;Upright 30-60 min after meal                Oral Care Recommendations: Oral care BID Follow up Recommendations: Other (comment)(tba) SLP Visit Diagnosis: Dysphagia, oropharyngeal  phase (R13.12) Plan: Continue with current plan of care       [place]. Amanda [name], Amanda [name] CCC/SLP Acute Rehabilitation Services Office number [phone] Pager [phone]  Amanda [name] 09/16/2019, 2:14 PM

## 2019-09-16 NOTE — Progress Notes (Signed)
[facility] PROGRESS NOTE  Amanda [name] NIO:[id number] DOB: 1951/02/23 DOA: 09/09/2019 PCP: [name], MD  Brief summary   68 year old female with history of diabetes, HTN, and neuropathy presenting from home with altered mental status. EMS had reported that patient was found alert with eyes open, not following commands, and left arm contracted in at the base of her disabled husband bed covered in feces. Husband not ale to provide any insight on scene or last seen well. Neighbors had reported that her car door had been opened since last night and front door found open. Found to glucose reading high. In ER, patient febrile 101.9 rectal, hypertensive 207/104, and tachycardic. CT head and CTA head and neck without acute findings. Labs noted for Na 139, CO2 21, glucose 522, BUN 36, sCr 1.87, CK 125, LA 2.6->3, WBC 14.4, normal coags, UA with high glucose, 5 ketones, >300 protein. She required intubation for airway protection after some desaturation episodes in the 80's. Neurology consulted. Cultures sent and started on empiric vancomycin, cefepime, and flagyl and ceftriaxone add for meningeal coverage. 30 cc/kg fluid bolus given. EEG showing negative for seizure activity, showing encephalopathy. LP cultures were negative.  CSF HSV DNA was negative.[name] PCCM admitted the patient as the patient was on mechanical ventilation.  She was extubated on 09/13/2019.  Patient was transferred to the hospital service on 09/14/2019.  Assessment/Plan:  Acute metabolic encephalopathy. Gradually improving clinically -Initially admitted by PCCM -Patient was followed by neurology who has since signed off -MRI brain findings felt to be consistent with PRES, but CO poisoning and severe hypoglycemic brain injury are felt to be higher on the imaging DDx -Likely multifactorial including acute on chronic renal failure, PRES, HONK, hypertensive encephalopathy, and possible infectious process  (aspiration) -EEG--severe diffuse encephalopathy, no seizure -Ammonia 22 -09/10/2019 UA negative for pyuria   Rhabdomyolysis -CPK peaked at 2271 -improving with IV fluids  Malignant hypertension -Holding lisinopril secondary to acute on chronic renal failure -started amlodipine, will add coreg if needed. hydralazine prn SBP >180. Close monitor   Acute on chronic renal failure.  -Baseline creatinine 1.1-1.3 -Serum creatinine peaked 2.25 -improving with iv fluids. Holding lisinopril  Lactic acidosis. Probable due to metformin in the setting of acute illness, renal failure.  -resolved. Discontinue metformin. D/w patient.   Dysphagia -Patient followed by speech therapy -She failed MBS--> n.p.o. except for meds and ice chips -Ask speech therapy to reevaluate as the patient is now more alert  SIRS -Patient finished 5 days of cefepime to cover for aspiration pneumonitis -monitor. Blood culture: NGTD. CSF culture: NGTD. Pend urine culture   Uncontrolled diabetes mellitus type 2 with hyperglycemia -09/09/2019 hemoglobin A1c 10.9 -NovoLog sliding scale -d/c metformin due to lactic acidosis  -holding Ozempic -Patient was not on any insulin prior to admission -started lantus 5 units  Hyperlipidemia -Holding statin secondary to elevated CPK  Deconditioning -PT evaluation  Code Status: full Family Communication: d/w patient, RN (indicate person spoken with, relationship, and if by phone, the number) Disposition Plan: rehab eval    Consultants:  PCCM  Neurology   Procedures:  MRI  Antibiotics: Anti-infectives (From admission, onward)   Start     Dose/Rate Route Frequency Ordered Stop   09/11/19 0600  acyclovir (ZOVIRAX) 500 mg in dextrose 5 % 100 mL IVPB  Status:  Discontinued     500 mg 110 mL/hr over 60 Minutes Intravenous Every 24 hours 09/10/19 0641 09/12/19 0932   09/10/19 1800  ceFEPIme (MAXIPIME) 2 g  in sodium chloride 0.9 % 100 mL IVPB  Status:   Discontinued     2 g 200 mL/hr over 30 Minutes Intravenous Every 24 hours 09/09/19 1835 09/10/19 0612   09/10/19 1800  ceFEPIme (MAXIPIME) 2 g in sodium chloride 0.9 % 100 mL IVPB     2 g 200 mL/hr over 30 Minutes Intravenous Every 24 hours 09/10/19 0612 09/14/19 1726   09/10/19 1330  vancomycin (VANCOCIN) IVPB 1000 mg/200 mL premix  Status:  Discontinued     1,000 mg 200 mL/hr over 60 Minutes Intravenous Every 48 hours 09/10/19 1251 09/12/19 0905   09/10/19 0645  acyclovir (ZOVIRAX) 500 mg in dextrose 5 % 100 mL IVPB     500 mg 110 mL/hr over 60 Minutes Intravenous  Once 09/10/19 0641 09/10/19 0828   09/09/19 2100  cefTRIAXone (ROCEPHIN) 2 g in sodium chloride 0.9 % 100 mL IVPB     2 g 200 mL/hr over 30 Minutes Intravenous  Once 09/09/19 2056 09/09/19 2159   09/09/19 1834  vancomycin variable dose per unstable renal function (pharmacist dosing)  Status:  Discontinued      Does not apply See admin instructions 09/09/19 1835 09/10/19 1251   09/09/19 1645  ceFEPIme (MAXIPIME) 2 g in sodium chloride 0.9 % 100 mL IVPB     2 g 200 mL/hr over 30 Minutes Intravenous  Once 09/09/19 1638 09/09/19 1736   09/09/19 1645  metroNIDAZOLE (FLAGYL) IVPB 500 mg     500 mg 100 mL/hr over 60 Minutes Intravenous  Once 09/09/19 1638 09/09/19 1900   09/09/19 1645  vancomycin (VANCOCIN) IVPB 1000 mg/200 mL premix     1,000 mg 200 mL/hr over 60 Minutes Intravenous  Once 09/09/19 1638 09/09/19 1900        (indicate start date, and stop date if known)  HPI/Subjective: No acute distress. Denies focal weakness or paresthesias. No diplopia. Per nursing, pt had a fall. Denies acute pains. Awaiting speech eval   Objective: Vitals:   09/16/19 0320 09/16/19 0812  BP:  (!) 171/95  Pulse: 81 83  Resp:  16  Temp:  97.6 F (36.4 C)  SpO2: 90% 100%    Intake/Output Summary (Last 24 hours) at 09/16/2019 1107 Last data filed at 09/16/2019 0900 Gross per 24 hour  Intake 0 ml  Output 800 ml  Net -800 ml    Filed Weights   09/10/19 0408 09/12/19 0312 09/13/19 0500  Weight: 56.3 kg 55.8 kg 56.8 kg    Exam:   General:  No acute distress   Cardiovascular: s1,s2 rrr  Respiratory: CTA BL  Abdomen: soft, nt   Musculoskeletal: no leg edema    Data Reviewed: Basic Metabolic Panel: Recent Labs  Lab 09/11/19 0650 09/11/19 1620 09/12/19 0434 09/13/19 0541 09/14/19 0400 09/15/19 0347 09/16/19 0313  NA 139  --  148* 143 143 146* 146*  K 3.8  --  3.0* 4.6 4.0 4.3 3.6  CL 111  --  120* 115* 117* 119* 121*  CO2 19*  --  20* 17* 16* 16* 21*  GLUCOSE 374*  --  101* 150* 125* 189* 107*  BUN 51*  --  40* 41* 35* 29* 20  CREATININE 2.25*  --  1.71* 1.61* 1.73* 1.74* 1.28*  CALCIUM 8.1*  --  7.4* 8.3* 8.3* 8.2* 8.1*  MG 2.4 2.5* 2.3 2.4 2.2  --   --   PHOS 3.2 3.1 3.2 3.6 3.3  --   --    Liver Function Tests:  Recent Labs  Lab 09/09/19 1636 09/10/19 0404 09/13/19 0541 09/16/19 0313  AST 45* 62*  --  29  ALT 51* 47*  --  32  ALKPHOS 79 56  --  54  BILITOT 1.2 0.7  --  0.5  PROT 7.8 6.3*  --  5.0*  ALBUMIN 4.5 3.4* 2.5* 2.2*   No results for input(s): LIPASE, AMYLASE in the last 168 hours. Recent Labs  Lab 09/10/19 0206  AMMONIA 22   CBC: Recent Labs  Lab 09/09/19 1636  09/11/19 0650 09/12/19 0434 09/13/19 0541 09/14/19 0400 09/16/19 0313  WBC 14.4*   < > 13.9* 10.6* 12.1* 10.7* 8.8  NEUTROABS 13.1*  --  12.4* 8.6* 9.5* 7.4  --   HGB 12.2   < > 9.1* 8.0* 9.9* 9.6* 8.0*  HCT 36.4   < > 26.5* 23.3* 29.8* 29.0* 24.3*  MCV 90.5   < > 91.7 91.4 94.9 93.5 93.1  PLT 360   < > 191 156 181 291 245   < > = values in this interval not displayed.   Cardiac Enzymes: Recent Labs  Lab 09/11/19 0650 09/12/19 0434 09/13/19 1119 09/14/19 0400 09/16/19 0313  CKTOTAL 2,271* 1,454* 655* 769* 309*  CKMB  --   --   --  7.2*  --    BNP (last 3 results) No results for input(s): BNP in the last 8760 hours.  ProBNP (last 3 results) No results for input(s): PROBNP in the last  8760 hours.  CBG: Recent Labs  Lab 09/15/19 1631 09/15/19 2002 09/15/19 2355 09/16/19 0358 09/16/19 0833  GLUCAP 174* 138* 80 107* 130*    Recent Results (from the past 240 hour(s))  SARS Coronavirus 2 [facility] order, Performed in [facility] hospital lab) Nasopharyngeal Urine, Catheterized     Status: None   Collection Time: 09/09/19  4:52 PM   Specimen: Urine, Catheterized; Nasopharyngeal  Result Value Ref Range Status   SARS Coronavirus 2 NEGATIVE NEGATIVE Final    Comment: (NOTE) If result is NEGATIVE SARS-CoV-2 target nucleic acids are NOT DETECTED. The SARS-CoV-2 RNA is generally detectable in upper and lower  respiratory specimens during the acute phase of infection. The lowest  concentration of SARS-CoV-2 viral copies this assay can detect is 250  copies / mL. A negative result does not preclude SARS-CoV-2 infection  and should not be used as the sole basis for treatment or other  patient management decisions.  A negative result may occur with  improper specimen collection / handling, submission of specimen other  than nasopharyngeal swab, presence of viral mutation(s) within the  areas targeted by this assay, and inadequate number of viral copies  (<250 copies / mL). A negative result must be combined with clinical  observations, patient history, and epidemiological information. If result is POSITIVE SARS-CoV-2 target nucleic acids are DETECTED. The SARS-CoV-2 RNA is generally detectable in upper and lower  respiratory specimens dur ing the acute phase of infection.  Positive  results are indicative of active infection with SARS-CoV-2.  Clinical  correlation with patient history and other diagnostic information is  necessary to determine patient infection status.  Positive results do  not rule out bacterial infection or co-infection with other viruses. If result is PRESUMPTIVE POSTIVE SARS-CoV-2 nucleic acids MAY BE PRESENT.   A presumptive positive result was  obtained on the submitted specimen  and confirmed on repeat testing.  While 2019 novel coronavirus  (SARS-CoV-2) nucleic acids may be present in the submitted sample  additional confirmatory  testing may be necessary for epidemiological  and / or clinical management purposes  to differentiate between  SARS-CoV-2 and other Sarbecovirus currently known to infect humans.  If clinically indicated additional testing with an alternate test  methodology [phone]) is advised. The SARS-CoV-2 RNA is generally  detectable in upper and lower respiratory sp ecimens during the acute  phase of infection. The expected result is Negative. Fact Sheet for Patients:  StrictlyIdeas.no Fact Sheet for Healthcare Providers: BankingDealers.co.za This test is not yet approved or cleared by the [place] FDA and has been authorized for detection and/or diagnosis of SARS-CoV-2 by FDA under an Emergency Use Authorization (EUA).  This EUA will remain in effect (meaning this test can be used) for the duration of the COVID-19 declaration under Section 564(b)(1) of the Act, 21 U.S.C. section 360bbb-3(b)(1), unless the authorization is terminated or revoked sooner. Performed at [facility], [place] [address] [place], [place] [address]   Urine Culture     Status: None   Collection Time: 09/09/19  4:52 PM   Specimen: Urine, Catheterized  Result Value Ref Range Status   Specimen Description URINE, CATHETERIZED  Final   Special Requests NONE  Final   Culture   Final    NO GROWTH Performed at [facility], [address], [place], [place] [address]    Report Status 09/10/2019 FINAL  Final  Blood Culture (routine x 2)     Status: None   Collection Time: 09/09/19  5:08 PM   Specimen: BLOOD  Result Value Ref Range Status   Specimen Description BLOOD LEFT ANTECUBITAL  Final   Special Requests   Final    BOTTLES DRAWN AEROBIC AND ANAEROBIC Blood Culture  results may not be optimal due to an excessive volume of blood received in culture bottles   Culture   Final    NO GROWTH 5 DAYS Performed at [facility], [place] [address] [place], [place] [address]    Report Status 09/14/2019 FINAL  Final  Blood Culture (routine x 2)     Status: None   Collection Time: 09/09/19  5:10 PM   Specimen: BLOOD  Result Value Ref Range Status   Specimen Description BLOOD BLOOD RIGHT FOREARM  Final   Special Requests   Final    BOTTLES DRAWN AEROBIC AND ANAEROBIC Blood Culture adequate volume   Culture   Final    NO GROWTH 5 DAYS Performed at [facility], [place] [address], [place], [place] [address]    Report Status 09/14/2019 FINAL  Final  CSF culture     Status: None   Collection Time: 09/10/19 12:02 AM   Specimen: CSF; Cerebrospinal Fluid  Result Value Ref Range Status   Specimen Description CSF  Final   Special Requests NONE  Final   Gram Stain   Final    WBC PRESENT,BOTH PMN AND MONONUCLEAR NO ORGANISMS SEEN CYTOSPIN SMEAR    Culture   Final    NO GROWTH 3 DAYS Performed at [facility], [place] [address] [place], [place] [address]    Report Status 09/13/2019 FINAL  Final  MRSA PCR Screening     Status: None   Collection Time: 09/10/19 12:54 AM  Result Value Ref Range Status   MRSA by PCR NEGATIVE NEGATIVE Final    Comment:        The GeneXpert MRSA Assay (FDA approved for NASAL specimens only), is one component of a comprehensive MRSA colonization surveillance  program. It is not intended to diagnose MRSA infection nor to guide or monitor treatment for MRSA infections. Performed at [facility], [place] [address], [place], [place] [address]   Respiratory Panel by PCR     Status: None   Collection Time: 09/11/19 10:45 AM   Specimen: Nasopharyngeal Swab; Respiratory  Result Value Ref Range Status   Adenovirus NOT DETECTED NOT DETECTED Final   Coronavirus 229E NOT DETECTED NOT DETECTED Final    Comment: (NOTE) The  Coronavirus on the Respiratory Panel, DOES NOT test for the novel  Coronavirus (2019 nCoV)    Coronavirus HKU1 NOT DETECTED NOT DETECTED Final   Coronavirus NL63 NOT DETECTED NOT DETECTED Final   Coronavirus OC43 NOT DETECTED NOT DETECTED Final   Metapneumovirus NOT DETECTED NOT DETECTED Final   Rhinovirus / Enterovirus NOT DETECTED NOT DETECTED Final   Influenza A NOT DETECTED NOT DETECTED Final   Influenza B NOT DETECTED NOT DETECTED Final   Parainfluenza Virus 1 NOT DETECTED NOT DETECTED Final   Parainfluenza Virus 2 NOT DETECTED NOT DETECTED Final   Parainfluenza Virus 3 NOT DETECTED NOT DETECTED Final   Parainfluenza Virus 4 NOT DETECTED NOT DETECTED Final   Respiratory Syncytial Virus NOT DETECTED NOT DETECTED Final   Bordetella pertussis NOT DETECTED NOT DETECTED Final   Chlamydophila pneumoniae NOT DETECTED NOT DETECTED Final   Mycoplasma pneumoniae NOT DETECTED NOT DETECTED Final    Comment: Performed at [facility], [place]. [address] [place], [place] [address]     Studies: Dg Chest Bilateral Decubitus  Result Date: 09/14/2019 CLINICAL DATA:  SOB; previously intubated EXAM: CHEST - BILATERAL DECUBITUS VIEW COMPARISON:  Radiograph 9/25 2020, CT 09/09/2019 FINDINGS: LEFT lateral decubitus and RIGHT lateral decubitus views of the thorax demonstrate no pneumothorax. No pleural effusion. IMPRESSION: No pneumothorax or pleural effusion. Electronically Signed   By: Suzy Bouchard M.D.   On: 09/14/2019 18:32   US Renal  Result Date: 09/15/2019 CLINICAL DATA:  Acute renal failure EXAM: RENAL / URINARY TRACT ULTRASOUND COMPLETE COMPARISON:  None. FINDINGS: Right Kidney: Renal measurements: 10.4 x 4.9 x 3.3 cm = volume: 86.21 mL. Mild hydronephrosis. Left Kidney: Renal measurements: 10.2 x 5.4 x 3.4 cm = volume: 98.6 mL. Echogenicity within normal limits. No mass or hydronephrosis visualized. Bladder: Appears normal for degree of bladder distention. IMPRESSION: Mild hydronephrosis  associated with the right kidney, age indeterminate. No other abnormalities. Electronically Signed   By: Dorise Bullion III M.D   On: 09/15/2019 17:51   Dg Swallowing Func-speech Pathology  Result Date: 09/14/2019 Objective Swallowing Evaluation: Type of Study: MBS-Modified Barium Swallow Study  Patient Details Name: Amanda [name] MRN: [id number] Date of Birth: 1951-03-21 Today's Date: 09/14/2019 Time: SLP Start Time (ACUTE ONLY): 60 -SLP Stop Time (ACUTE ONLY): 1150 SLP Time Calculation (min) (ACUTE ONLY): 20 min Past Medical History: Past Medical History: Diagnosis Date . Allergy   SEASONAL . Anemia  . Arthritis  . Cataract   PRESENT IN LEFT/ CATARACTS REMOVED FROM RIGHT . Diabetes mellitus 1996 . DKA (diabetic ketoacidoses) ([place]) 01/02/2013 . Hyperlipidemia  . Hypertension  . Hypotension  . Influenza A (H1N1) 01/03/2013 . Neuromuscular disorder ([place])   NEUROPATHY . SYNCOPE 05/10/2010  Qualifier: Diagnosis of  By: Marilynne Halsted RN, BSN, Jacquelyn   Past Surgical History: Past Surgical History: Procedure Laterality Date . ABDOMINAL HYSTERECTOMY    10 years ago  . BREAST BIOPSY   . Cataracts   . ROTATOR CUFF REPAIR   . SHOULDER SURGERY  Rotator cuff tear  HPI: Pt is a 68 yo female presenting with AMS. 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 Pt required intubation 9/21-9/25. PMH: DM, HTN, HLD  Subjective: alert, pleasant, confused Assessment / Plan / Recommendation CHL IP CLINICAL IMPRESSIONS 09/14/2019 Clinical Impression Pt has a moderate oropharyngeal dysphagia that is felt to be a combination of prolonged intubation, baseline structural issues, and current AMS. She has slow posterior propulsion of boluses orally, often allowing entire boluses of puree to sit in her vallecular space as she takes additional bites in her mouth. SLP provided cues to swallow volitionally, which pt then performed swiftly. Her base of  tongue retraction and epiglottic inversion are reduced, resulting in mild vallecular residue and intermittent penetration of all liquids. This penetration for the most part is in small amounts and more shallow in the laryngeal vestibule. Pt also has suspected osteophytes at the level of the UES, which in conjunction with reduced pharyngeal strength cause reduced UES opening and pyriform sinus residue. Residue from the pyriform sinuses spills posteriorly over the arytenoids and down the trachea. It is clearly observed happening with thin liquids. With other consistencies it is not always observed while fluoro was on, but barium was noted along the posterior trachea after imaging resumed. A chin tuck helped to increase safety and efficiency with spoonfuls of honey, but pt had a hard time cognitively maintaining this posture. Upon completion of testing after moving back to bed, pt had a prolonged coughing episode with audibly wet vocal quality. Mod cues were given to cough and pt ultimately expectorated barium. Recommend that she remin NPO except for meds crushed in puree and ice chips after oral care. Although some issues are likely more baseline, suspect the will improve with improved mentation, restrengthening, and additional time post-extubation. SLP Visit Diagnosis Dysphagia, pharyngoesophageal phase (R13.14);Dysphagia, oropharyngeal phase (R13.12) Attention and concentration deficit following -- Frontal lobe and executive function deficit following -- Impact on safety and function Moderate aspiration risk   CHL IP TREATMENT RECOMMENDATION 09/14/2019 Treatment Recommendations Therapy as outlined in treatment plan below   Prognosis 09/14/2019 Prognosis for Safe Diet Advancement Good Barriers to Reach Goals Cognitive deficits Barriers/Prognosis Comment -- CHL IP DIET RECOMMENDATION 09/14/2019 SLP Diet Recommendations NPO except meds;Ice chips PRN after oral care Liquid Administration via -- Medication Administration  Crushed with puree Compensations Slow rate;Small sips/bites Postural Changes --   CHL IP OTHER RECOMMENDATIONS 09/14/2019 Recommended Consults -- Oral Care Recommendations Oral care QID Other Recommendations Have oral suction available   CHL IP FOLLOW UP RECOMMENDATIONS 09/14/2019 Follow up Recommendations (No Data)   CHL IP FREQUENCY AND DURATION 09/14/2019 Speech Therapy Frequency (ACUTE ONLY) min 2x/week Treatment Duration 2 weeks      CHL IP ORAL PHASE 09/14/2019 Oral Phase Impaired Oral - Pudding Teaspoon -- Oral - Pudding Cup -- Oral - Honey Teaspoon Decreased bolus cohesion Oral - Honey Cup Decreased bolus cohesion Oral - Nectar Teaspoon -- Oral - Nectar Cup Decreased bolus cohesion Oral - Nectar Straw -- Oral - Thin Teaspoon Decreased bolus cohesion Oral - Thin Cup Decreased bolus cohesion Oral - Thin Straw -- Oral - Puree Decreased bolus cohesion Oral - Mech Soft -- Oral - Regular -- Oral - Multi-Consistency -- Oral - Pill -- Oral Phase - Comment --  CHL IP PHARYNGEAL PHASE 09/14/2019 Pharyngeal Phase Impaired Pharyngeal- Pudding Teaspoon -- Pharyngeal -- Pharyngeal- Pudding Cup -- Pharyngeal -- Pharyngeal- Honey  Teaspoon Reduced tongue base retraction;Reduced airway/laryngeal closure;Reduced epiglottic inversion;Pharyngeal residue - valleculae;Pharyngeal residue - pyriform;Penetration/Apiration after swallow Pharyngeal Material enters airway, remains ABOVE vocal cords and not ejected out Pharyngeal- Honey Cup Reduced tongue base retraction;Reduced airway/laryngeal closure;Reduced epiglottic inversion;Pharyngeal residue - valleculae;Pharyngeal residue - pyriform;Penetration/Apiration after swallow Pharyngeal Material enters airway, remains ABOVE vocal cords and not ejected out Pharyngeal- Nectar Teaspoon -- Pharyngeal -- Pharyngeal- Nectar Cup Reduced tongue base retraction;Reduced airway/laryngeal closure;Reduced epiglottic inversion;Pharyngeal residue - valleculae;Pharyngeal residue -  pyriform;Penetration/Apiration after swallow Pharyngeal Material enters airway, passes BELOW cords and not ejected out despite cough attempt by patient Pharyngeal- Nectar Straw -- Pharyngeal -- Pharyngeal- Thin Teaspoon Reduced tongue base retraction;Reduced airway/laryngeal closure;Reduced epiglottic inversion;Pharyngeal residue - valleculae;Pharyngeal residue - pyriform Pharyngeal -- Pharyngeal- Thin Cup Reduced tongue base retraction;Reduced airway/laryngeal closure;Reduced epiglottic inversion;Pharyngeal residue - valleculae;Pharyngeal residue - pyriform;Penetration/Apiration after swallow Pharyngeal Material enters airway, passes BELOW cords and not ejected out despite cough attempt by patient Pharyngeal- Thin Straw -- Pharyngeal -- Pharyngeal- Puree Reduced tongue base retraction;Reduced airway/laryngeal closure;Reduced epiglottic inversion;Pharyngeal residue - valleculae;Pharyngeal residue - pyriform;Penetration/Apiration after swallow Pharyngeal Material enters airway, remains ABOVE vocal cords and not ejected out Pharyngeal- Mechanical Soft -- Pharyngeal -- Pharyngeal- Regular -- Pharyngeal -- Pharyngeal- Multi-consistency -- Pharyngeal -- Pharyngeal- Pill -- Pharyngeal -- Pharyngeal Comment --  CHL IP CERVICAL ESOPHAGEAL PHASE 09/14/2019 Cervical Esophageal Phase Impaired Pudding Teaspoon -- Pudding Cup -- Honey Teaspoon Reduced cricopharyngeal relaxation Honey Cup Reduced cricopharyngeal relaxation Nectar Teaspoon -- Nectar Cup Reduced cricopharyngeal relaxation Nectar Straw -- Thin Teaspoon Reduced cricopharyngeal relaxation Thin Cup Reduced cricopharyngeal relaxation Thin Straw -- Puree Reduced cricopharyngeal relaxation Mechanical Soft -- Regular -- Multi-consistency -- Pill -- Cervical Esophageal Comment -- Venita Sheffield Nix 09/14/2019, 12:36 PM  Pollyann Glen, M.A. CCC-SLP Acute Rehabilitation Services Pager [phone] Office [phone]              Scheduled Meds: . chlorhexidine gluconate (MEDLINE  KIT)  15 mL Mouth Rinse BID  . heparin  5,000 Units Subcutaneous Q8H  . insulin aspart  0-15 Units Subcutaneous Q4H  . insulin detemir  5 Units Subcutaneous Daily  . pantoprazole (PROTONIX) IV  40 mg Intravenous Daily   Continuous Infusions: . sodium chloride 10 mL/hr at 09/13/19 2200  . sodium chloride Stopped (09/15/19 1345)    Active Problems:   Acute encephalopathy   Acute metabolic encephalopathy   Acute on chronic renal failure (HCC)   Respiratory failure ([place])    Time spent: >25 minutes     Kinnie Feil  [facility] Pager [phone]. If 7PM-7AM, please contact night-coverage at [email or web address], password [facility] 09/16/2019, 11:07 AM  LOS: 7 days

## 2019-09-16 NOTE — Care Management Important Message (Signed)
Important Message  Patient Details  Name: Amanda [name] MRN: [id number] Date of Birth: 1951/11/10   Medicare Important Message Given:  Yes     Memory Argue 09/16/2019, 2:54 PM

## 2019-09-16 NOTE — Progress Notes (Signed)
Pt had an unwitnessed fall this am;bed alarm on and activated;pt found on floormat; no injury sustained; 3 staff nurses assisted pt back on the bed; doctor,dept director;charge RN;family were notified;Pt now placed on a low bed;doctor ordered telesitter as a safety precaution;fall assessment huddle completed;safety zone report submitted.

## 2019-09-16 NOTE — Progress Notes (Signed)
Inpatient Rehab Admissions:  Inpatient Rehab Consult received.  I met with patient and her daughter at the bedside for rehabilitation assessment and to discuss goals and expectations of an inpatient rehab admission.  Daughter will need to determine if family is able to provide 24/7 assist at discharge.  Will follow.   Signed: Shann Medal, PT, DPT Admissions Coordinator [phone] 09/16/19  5:12 PM

## 2019-09-16 NOTE — Progress Notes (Signed)
Nutrition Follow-up  DOCUMENTATION CODES:   Not applicable  INTERVENTION:  Provide Ensure Enlive po BID (thickened to appropriate consistency), each supplement provides 350 kcal and 20 grams of protein.  Provide Magic cup TID with meals, each supplement provides 290 kcal and 9 grams of protein   Encourage adequate PO intake.   NUTRITION DIAGNOSIS:   Inadequate oral intake related to acute illness as evidenced by NPO status; diet advanced; improving  GOAL:   Patient will meet greater than or equal to 90% of their needs; progressing  MONITOR:   Vent status, TF tolerance, Labs, Weight trends, Skin  REASON FOR ASSESSMENT:   Consult Assessment of nutrition requirement/status, Enteral/tube feeding initiation and management  ASSESSMENT:   68 yo female AMS with suspected PRES, HTN emergency, acute respiratory failure requiring intubation, AKI with rhabdomyolysis.PMH includes HTN, DM, normocytic anemia, dyslipidemia, MDD  9/21 Admit, Intubated, CT abdomen with cholelithiasis, possible mild colitis 9/25 Extubated  Pt more alert and mentation has improved today. Diet has been advanced by SLP to a dysphagia 1 diet with nectar thick liquids. RD to order nutritional supplements to aid in caloric and protein needs. Labs and medications reviewed.   NUTRITION - FOCUSED PHYSICAL EXAM:    Most Recent Value  Orbital Region  No depletion  Upper Arm Region  No depletion  Thoracic and Lumbar Region  No depletion  Buccal Region  No depletion  Temple Region  No depletion  Clavicle Bone Region  No depletion  Clavicle and Acromion Bone Region  No depletion  Scapular Bone Region  Unable to assess  Dorsal Hand  Unable to assess  Patellar Region  No depletion  Anterior Thigh Region  No depletion  Posterior Calf Region  No depletion  Edema (RD Assessment)  None  Hair  Reviewed  Eyes  Reviewed  Mouth  Reviewed  Skin  Reviewed  Nails  Reviewed       Diet Order:   Diet Order             DIET - DYS 1 Room service appropriate? Yes; Fluid consistency: Nectar Thick  Diet effective now              EDUCATION NEEDS:   Education needs have been addressed  Skin:  Skin Assessment: Reviewed RN Assessment  Last BM:  9/21  Height:   Ht Readings from Last 1 Encounters:  09/09/19 5\' 4"  (1.626 m)    Weight:   Wt Readings from Last 1 Encounters:  09/13/19 56.8 kg    Ideal Body Weight:  54.5 kg  BMI:  Body mass index is 21.49 kg/m.  Estimated Nutritional Needs:   Kcal:  1700-1900  Protein:  85-95 grams  Fluid:  >/= 1.7 L/day    Corrin Parker, MS, RD, LDN Pager # [phone] After hours/ weekend pager # [phone]

## 2019-09-17 LAB — BASIC METABOLIC PANEL
Anion gap: 11 (ref 5–15)
BUN: 20 mg/dL (ref 8–23)
CO2: 21 mmol/L — ABNORMAL LOW (ref 22–32)
Calcium: 8.1 mg/dL — ABNORMAL LOW (ref 8.9–10.3)
Chloride: 111 mmol/L (ref 98–111)
Creatinine, Ser: 1.49 mg/dL — ABNORMAL HIGH (ref 0.44–1.00)
GFR calc Af Amer: 42 mL/min — ABNORMAL LOW (ref >60)
GFR calc non Af Amer: 36 mL/min — ABNORMAL LOW (ref >60)
Glucose, Bld: 133 mg/dL — ABNORMAL HIGH (ref 70–99)
Potassium: 3.2 mmol/L — ABNORMAL LOW (ref 3.5–5.1)
Sodium: 143 mmol/L (ref 135–145)

## 2019-09-17 LAB — GLUCOSE, CAPILLARY
Glucose-Capillary: 121 mg/dL — ABNORMAL HIGH (ref 70–99)
Glucose-Capillary: 129 mg/dL — ABNORMAL HIGH (ref 70–99)
Glucose-Capillary: 145 mg/dL — ABNORMAL HIGH (ref 70–99)
Glucose-Capillary: 169 mg/dL — ABNORMAL HIGH (ref 70–99)
Glucose-Capillary: 271 mg/dL — ABNORMAL HIGH (ref 70–99)
Glucose-Capillary: 287 mg/dL — ABNORMAL HIGH (ref 70–99)
Glucose-Capillary: 96 mg/dL (ref 70–99)

## 2019-09-17 MED ORDER — BISACODYL 5 MG PO TBEC
5.0000 mg | DELAYED_RELEASE_TABLET | Freq: Every day | ORAL | Status: DC | PRN
  Administered 2019-09-20: 5 mg via ORAL
  Filled 2019-09-17: qty 1

## 2019-09-17 MED ORDER — RESOURCE THICKENUP CLEAR PO POWD
Freq: Once | ORAL | Status: AC
  Administered 2019-09-17: 18:00:00 via ORAL
  Filled 2019-09-17: qty 125

## 2019-09-17 MED ORDER — SODIUM CHLORIDE 0.9 % IV SOLN: INTRAVENOUS | Status: AC

## 2019-09-17 MED ORDER — POTASSIUM CHLORIDE CRYS ER 20 MEQ PO TBCR
40.0000 meq | EXTENDED_RELEASE_TABLET | Freq: Two times a day (BID) | ORAL | Status: AC
  Administered 2019-09-17 (×2): 40 meq via ORAL
  Filled 2019-09-17 (×3): qty 2

## 2019-09-17 MED ORDER — DOCUSATE SODIUM 100 MG PO CAPS
100.0000 mg | ORAL_CAPSULE | Freq: Every day | ORAL | Status: DC
  Administered 2019-09-17: 100 mg via ORAL

## 2019-09-17 NOTE — Progress Notes (Signed)
Speech Language Pathology Treatment: Dysphagia  Patient Details Name: Amanda [name] MRN: [id number] DOB: 21-Mar-1951 Today's Date: 09/17/2019 Time: 6433-2951 SLP Time Calculation (min) (ACUTE ONLY): 18 min  Assessment / Plan / Recommendation Clinical Impression  Pt's mentation remains similar to yesterday - interactive, making needs known, still with some confusion.  She and dtr report no difficulty with dinner last night. Lungs congested-sounding per RN.  Clinically, when cued to take small sips of thin liquid trials, there remains multiple sub-swallows with each bolus; no coughing today.  Concerns for aspiration remain given findings from [facility] and performance when eating mixed solid/liquid consistencies.  Recommend continuing dysphagia 1 diet with nectar-thick liquids for now; pt may benefit from repeat MBS later this week.  SLP will continue to follow.   HPI HPI: Pt is a 68 yo female presenting with AMS. 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 Pt required intubation 9/21-9/25. PMH: DM, HTN, HLD      SLP Plan  Continue with current plan of care       Recommendations  Diet recommendations: Dysphagia 1 (puree);Nectar-thick liquid Liquids provided via: Cup;Straw Medication Administration: Crushed with puree Supervision: Staff to assist with self feeding Compensations: Slow rate;Small sips/bites Postural Changes and/or Swallow Maneuvers: Seated upright 90 degrees;Upright 30-60 min after meal                Oral Care Recommendations: Oral care BID SLP Visit Diagnosis: Dysphagia, oropharyngeal phase (R13.12) Plan: Continue with current plan of care       GO                [name] 09/17/2019, 12:02 PM  Mynor Witkop L. Samson Frederic, MA CCC/SLP Acute Rehabilitation Services Office number [phone] Pager [phone]

## 2019-09-17 NOTE — Plan of Care (Signed)
  Problem: Safety: Goal: Ability to remain free from injury will improve Outcome: Progressing   Problem: Skin Integrity: Goal: Risk for impaired skin integrity will decrease Outcome: Progressing   

## 2019-09-17 NOTE — Progress Notes (Signed)
Inpatient Rehab Admissions Coordinator:   Met with pt and her daughter, Chanell, at bedside.  Daughter is able and willing to provide 24/7 assist between herself and hired caregivers.  Will open insurance for possible admission tomorrow pending bed availability and insurance approval.   Shann Medal, PT, DPT Admissions Coordinator [phone] 09/17/19  2:48 PM

## 2019-09-17 NOTE — Progress Notes (Addendum)
[facility] PROGRESS NOTE  Amanda [name] LJQ:[id number] DOB: 03/02/1951 DOA: 09/09/2019 PCP: [name], MD  Brief summary   68 year old female with history of diabetes, HTN, and neuropathy presenting from home with altered mental status. EMS had reported that patient was found alert with eyes open, not following commands, and left arm contracted in at the base of her disabled husband bed covered in feces. Husband not ale to provide any insight on scene or last seen well. Neighbors had reported that her car door had been opened since last night and front door found open. Found to glucose reading high. In ER, patient febrile 101.9 rectal, hypertensive 207/104, and tachycardic. CT head and CTA head and neck without acute findings. Labs noted for Na 139, CO2 21, glucose 522, BUN 36, sCr 1.87, CK 125, LA 2.6->3, WBC 14.4, normal coags, UA with high glucose, 5 ketones, >300 protein. She required intubation for airway protection after some desaturation episodes in the 80's. Neurology consulted. Cultures sent and started on empiric vancomycin, cefepime, and flagyl and ceftriaxone add for meningeal coverage. 30 cc/kg fluid bolus given. EEG showing negative for seizure activity, showing encephalopathy. LP cultures were negative.  CSF HSV DNA was negative.[name] PCCM admitted the patient as the patient was on mechanical ventilation.  She was extubated on 09/13/2019.  Patient was transferred to the hospital service on 09/14/2019.  Assessment/Plan:  Acute metabolic encephalopathy. Gradually improving clinically -Initially admitted by PCCM -Patient was followed by neurology who has since signed off -MRI brain findings felt to be consistent with PRES, but CO poisoning and severe hypoglycemic brain injury are felt to be higher on the imaging DDx -Likely multifactorial including acute on chronic renal failure, PRES, HONK, hypertensive encephalopathy, and possible infectious process  (aspiration) -EEG--severe diffuse encephalopathy, no seizure -Ammonia 22 -09/10/2019 UA negative for pyuria -planned for CIR   Rhabdomyolysis -CPK peaked at 2271 -improving with IV fluids.   Malignant hypertension -Holding lisinopril secondary to acute on chronic renal failure -started amlodipine (9/28>), will add coreg if needed. hydralazine prn SBP >180. Close monitor   Acute on chronic renal failure.  -Baseline creatinine 1.1-1.3 -Serum creatinine peaked 2.25 -improving with iv fluids. Cont iv fluids for 24 hrs. Holding lisinopril  Lactic acidosis. Probable due to metformin in the setting of acute illness, renal failure.  -resolved. Discontinue metformin. D/w patient.   Dysphagia -Patient followed by speech therapy -initially, she failed MBS. Now on dys1. advance diet per speech   SIRS -Patient finished 5 days of cefepime to cover for aspiration pneumonitis -monitor. Blood culture: NGTD. CSF culture: NGTD. Pend urine culture   Uncontrolled diabetes mellitus type 2 with hyperglycemia -09/09/2019 hemoglobin A1c 10.9 -NovoLog sliding scale -d/c metformin due to lactic acidosis  -holding Ozempic -Patient was not on any insulin prior to admission -started lantus 5 units+ISS  Hyperlipidemia -Holding statin secondary to elevated CPK  Deconditioning -PT evaluation: CIR  Code Status: full Family Communication: d/w patient, RN recently called updated her family(indicate person spoken with, relationship, and if by phone, the number) Disposition Plan: rehab eval. CIR   Consultants:  PCCM  Neurology   Procedures:  MRI  Antibiotics: Anti-infectives (From admission, onward)   Start     Dose/Rate Route Frequency Ordered Stop   09/11/19 0600  acyclovir (ZOVIRAX) 500 mg in dextrose 5 % 100 mL IVPB  Status:  Discontinued     500 mg 110 mL/hr over 60 Minutes Intravenous Every 24 hours 09/10/19 0641 09/12/19 0932   09/10/19  1800  ceFEPIme (MAXIPIME) 2 g in sodium  chloride 0.9 % 100 mL IVPB  Status:  Discontinued     2 g 200 mL/hr over 30 Minutes Intravenous Every 24 hours 09/09/19 1835 09/10/19 0612   09/10/19 1800  ceFEPIme (MAXIPIME) 2 g in sodium chloride 0.9 % 100 mL IVPB     2 g 200 mL/hr over 30 Minutes Intravenous Every 24 hours 09/10/19 0612 09/14/19 1726   09/10/19 1330  vancomycin (VANCOCIN) IVPB 1000 mg/200 mL premix  Status:  Discontinued     1,000 mg 200 mL/hr over 60 Minutes Intravenous Every 48 hours 09/10/19 1251 09/12/19 0905   09/10/19 0645  acyclovir (ZOVIRAX) 500 mg in dextrose 5 % 100 mL IVPB     500 mg 110 mL/hr over 60 Minutes Intravenous  Once 09/10/19 0641 09/10/19 0828   09/09/19 2100  cefTRIAXone (ROCEPHIN) 2 g in sodium chloride 0.9 % 100 mL IVPB     2 g 200 mL/hr over 30 Minutes Intravenous  Once 09/09/19 2056 09/09/19 2159   09/09/19 1834  vancomycin variable dose per unstable renal function (pharmacist dosing)  Status:  Discontinued      Does not apply See admin instructions 09/09/19 1835 09/10/19 1251   09/09/19 1645  ceFEPIme (MAXIPIME) 2 g in sodium chloride 0.9 % 100 mL IVPB     2 g 200 mL/hr over 30 Minutes Intravenous  Once 09/09/19 1638 09/09/19 1736   09/09/19 1645  metroNIDAZOLE (FLAGYL) IVPB 500 mg     500 mg 100 mL/hr over 60 Minutes Intravenous  Once 09/09/19 1638 09/09/19 1900   09/09/19 1645  vancomycin (VANCOCIN) IVPB 1000 mg/200 mL premix     1,000 mg 200 mL/hr over 60 Minutes Intravenous  Once 09/09/19 1638 09/09/19 1900       (indicate start date, and stop date if known)  HPI/Subjective: No acute events overnight, nodistress. No SOB. No headaches. No focal neuro symptoms.  Awaiting CIR  Objective: Vitals:   09/16/19 1951 09/17/19 0450  BP: (!) 116/56 136/73  Pulse: 85 85  Resp: 16 16  Temp: 98.6 F (37 C) 99.1 F (37.3 C)  SpO2: 99% 98%    Intake/Output Summary (Last 24 hours) at 09/17/2019 1011 Last data filed at 09/17/2019 0345 Gross per 24 hour  Intake 2479.65 ml  Output -   Net 2479.65 ml   Filed Weights   09/12/19 0312 09/13/19 0500 09/17/19 0500  Weight: 55.8 kg 56.8 kg 59 kg    Exam:   General:  No acute distress   Cardiovascular: s1,s2 rrr  Respiratory: CTA BL  Abdomen: soft, nt   Musculoskeletal: no leg edema    Data Reviewed: Basic Metabolic Panel: Recent Labs  Lab 09/11/19 0650 09/11/19 1620 09/12/19 0434 09/13/19 0541 09/14/19 0400 09/15/19 0347 09/16/19 0313 09/17/19 0536  NA 139  --  148* 143 143 146* 146* 143  K 3.8  --  3.0* 4.6 4.0 4.3 3.6 3.2*  CL 111  --  120* 115* 117* 119* 121* 111  CO2 19*  --  20* 17* 16* 16* 21* 21*  GLUCOSE 374*  --  101* 150* 125* 189* 107* 133*  BUN 51*  --  40* 41* 35* 29* 20 20  CREATININE 2.25*  --  1.71* 1.61* 1.73* 1.74* 1.28* 1.49*  CALCIUM 8.1*  --  7.4* 8.3* 8.3* 8.2* 8.1* 8.1*  MG 2.4 2.5* 2.3 2.4 2.2  --   --   --   PHOS 3.2 3.1 3.2  3.6 3.3  --   --   --    Liver Function Tests: Recent Labs  Lab 09/13/19 0541 09/16/19 0313  AST  --  29  ALT  --  32  ALKPHOS  --  54  BILITOT  --  0.5  PROT  --  5.0*  ALBUMIN 2.5* 2.2*   No results for input(s): LIPASE, AMYLASE in the last 168 hours. No results for input(s): AMMONIA in the last 168 hours. CBC: Recent Labs  Lab 09/11/19 0650 09/12/19 0434 09/13/19 0541 09/14/19 0400 09/16/19 0313  WBC 13.9* 10.6* 12.1* 10.7* 8.8  NEUTROABS 12.4* 8.6* 9.5* 7.4  --   HGB 9.1* 8.0* 9.9* 9.6* 8.0*  HCT 26.5* 23.3* 29.8* 29.0* 24.3*  MCV 91.7 91.4 94.9 93.5 93.1  PLT 191 156 181 291 245   Cardiac Enzymes: Recent Labs  Lab 09/11/19 0650 09/12/19 0434 09/13/19 1119 09/14/19 0400 09/16/19 0313  CKTOTAL 2,271* 1,454* 655* 769* 309*  CKMB  --   --   --  7.2*  --    BNP (last 3 results) No results for input(s): BNP in the last 8760 hours.  ProBNP (last 3 results) No results for input(s): PROBNP in the last 8760 hours.  CBG: Recent Labs  Lab 09/16/19 1650 09/16/19 1953 09/17/19 0010 09/17/19 0349 09/17/19 0731  GLUCAP  206* 234* 169* 129* 121*    Recent Results (from the past 240 hour(s))  SARS Coronavirus 2 [facility] order, Performed in [facility] hospital lab) Nasopharyngeal Urine, Catheterized     Status: None   Collection Time: 09/09/19  4:52 PM   Specimen: Urine, Catheterized; Nasopharyngeal  Result Value Ref Range Status   SARS Coronavirus 2 NEGATIVE NEGATIVE Final    Comment: (NOTE) If result is NEGATIVE SARS-CoV-2 target nucleic acids are NOT DETECTED. The SARS-CoV-2 RNA is generally detectable in upper and lower  respiratory specimens during the acute phase of infection. The lowest  concentration of SARS-CoV-2 viral copies this assay can detect is 250  copies / mL. A negative result does not preclude SARS-CoV-2 infection  and should not be used as the sole basis for treatment or other  patient management decisions.  A negative result may occur with  improper specimen collection / handling, submission of specimen other  than nasopharyngeal swab, presence of viral mutation(s) within the  areas targeted by this assay, and inadequate number of viral copies  (<250 copies / mL). A negative result must be combined with clinical  observations, patient history, and epidemiological information. If result is POSITIVE SARS-CoV-2 target nucleic acids are DETECTED. The SARS-CoV-2 RNA is generally detectable in upper and lower  respiratory specimens dur ing the acute phase of infection.  Positive  results are indicative of active infection with SARS-CoV-2.  Clinical  correlation with patient history and other diagnostic information is  necessary to determine patient infection status.  Positive results do  not rule out bacterial infection or co-infection with other viruses. If result is PRESUMPTIVE POSTIVE SARS-CoV-2 nucleic acids MAY BE PRESENT.   A presumptive positive result was obtained on the submitted specimen  and confirmed on repeat testing.  While 2019 novel coronavirus  (SARS-CoV-2) nucleic  acids may be present in the submitted sample  additional confirmatory testing may be necessary for epidemiological  and / or clinical management purposes  to differentiate between  SARS-CoV-2 and other Sarbecovirus currently known to infect humans.  If clinically indicated additional testing with an alternate test  methodology [phone]) is advised. The SARS-CoV-2  RNA is generally  detectable in upper and lower respiratory sp ecimens during the acute  phase of infection. The expected result is Negative. Fact Sheet for Patients:  StrictlyIdeas.no Fact Sheet for Healthcare Providers: BankingDealers.co.za This test is not yet approved or cleared by the [place] FDA and has been authorized for detection and/or diagnosis of SARS-CoV-2 by FDA under an Emergency Use Authorization (EUA).  This EUA will remain in effect (meaning this test can be used) for the duration of the COVID-19 declaration under Section 564(b)(1) of the Act, 21 U.S.C. section 360bbb-3(b)(1), unless the authorization is terminated or revoked sooner. Performed at [facility], [place] [address] [place], [place] [address]   Urine Culture     Status: None   Collection Time: 09/09/19  4:52 PM   Specimen: Urine, Catheterized  Result Value Ref Range Status   Specimen Description URINE, CATHETERIZED  Final   Special Requests NONE  Final   Culture   Final    NO GROWTH Performed at [facility], [address] [place], [place] [address]    Report Status 09/10/2019 FINAL  Final  Blood Culture (routine x 2)     Status: None   Collection Time: 09/09/19  5:08 PM   Specimen: BLOOD  Result Value Ref Range Status   Specimen Description BLOOD LEFT ANTECUBITAL  Final   Special Requests   Final    BOTTLES DRAWN AEROBIC AND ANAEROBIC Blood Culture results may not be optimal due to an excessive volume of blood received in culture bottles   Culture   Final    NO GROWTH 5  DAYS Performed at [facility], [place] [address] [place], [place] [address]    Report Status 09/14/2019 FINAL  Final  Blood Culture (routine x 2)     Status: None   Collection Time: 09/09/19  5:10 PM   Specimen: BLOOD  Result Value Ref Range Status   Specimen Description BLOOD BLOOD RIGHT FOREARM  Final   Special Requests   Final    BOTTLES DRAWN AEROBIC AND ANAEROBIC Blood Culture adequate volume   Culture   Final    NO GROWTH 5 DAYS Performed at [facility], [place] [address] [place], [place] [address]    Report Status 09/14/2019 FINAL  Final  CSF culture     Status: None   Collection Time: 09/10/19 12:02 AM   Specimen: CSF; Cerebrospinal Fluid  Result Value Ref Range Status   Specimen Description CSF  Final   Special Requests NONE  Final   Gram Stain   Final    WBC PRESENT,BOTH PMN AND MONONUCLEAR NO ORGANISMS SEEN CYTOSPIN SMEAR    Culture   Final    NO GROWTH 3 DAYS Performed at [facility], [place] [address], [place], [place] [address]    Report Status 09/13/2019 FINAL  Final  MRSA PCR Screening     Status: None   Collection Time: 09/10/19 12:54 AM  Result Value Ref Range Status   MRSA by PCR NEGATIVE NEGATIVE Final    Comment:        The GeneXpert MRSA Assay (FDA approved for NASAL specimens only), is one component of a comprehensive MRSA colonization surveillance program. It is not intended to diagnose MRSA infection nor to guide or monitor treatment for MRSA infections. Performed at [facility], [place] [address] [place], [place] [address]   Respiratory Panel by PCR     Status: None  Collection Time: 09/11/19 10:45 AM   Specimen: Nasopharyngeal Swab; Respiratory  Result Value Ref Range Status   Adenovirus NOT DETECTED NOT DETECTED Final   Coronavirus 229E NOT DETECTED NOT DETECTED Final    Comment: (NOTE) The Coronavirus on the Respiratory Panel, DOES NOT test for the novel  Coronavirus (2019 nCoV)    Coronavirus HKU1 NOT DETECTED  NOT DETECTED Final   Coronavirus NL63 NOT DETECTED NOT DETECTED Final   Coronavirus OC43 NOT DETECTED NOT DETECTED Final   Metapneumovirus NOT DETECTED NOT DETECTED Final   Rhinovirus / Enterovirus NOT DETECTED NOT DETECTED Final   Influenza A NOT DETECTED NOT DETECTED Final   Influenza B NOT DETECTED NOT DETECTED Final   Parainfluenza Virus 1 NOT DETECTED NOT DETECTED Final   Parainfluenza Virus 2 NOT DETECTED NOT DETECTED Final   Parainfluenza Virus 3 NOT DETECTED NOT DETECTED Final   Parainfluenza Virus 4 NOT DETECTED NOT DETECTED Final   Respiratory Syncytial Virus NOT DETECTED NOT DETECTED Final   Bordetella pertussis NOT DETECTED NOT DETECTED Final   Chlamydophila pneumoniae NOT DETECTED NOT DETECTED Final   Mycoplasma pneumoniae NOT DETECTED NOT DETECTED Final    Comment: Performed at [facility], [address], [place], [place] [address]  Culture, Urine     Status: None   Collection Time: 09/15/19  8:46 AM   Specimen: Urine, Random  Result Value Ref Range Status   Specimen Description URINE, RANDOM  Final   Special Requests NONE  Final   Culture   Final    NO GROWTH Performed at [facility], [place] [address], [place], [place] [address]    Report Status 09/16/2019 FINAL  Final     Studies: US Renal  Result Date: 09/15/2019 CLINICAL DATA:  Acute renal failure EXAM: RENAL / URINARY TRACT ULTRASOUND COMPLETE COMPARISON:  None. FINDINGS: Right Kidney: Renal measurements: 10.4 x 4.9 x 3.3 cm = volume: 86.21 mL. Mild hydronephrosis. Left Kidney: Renal measurements: 10.2 x 5.4 x 3.4 cm = volume: 98.6 mL. Echogenicity within normal limits. No mass or hydronephrosis visualized. Bladder: Appears normal for degree of bladder distention. IMPRESSION: Mild hydronephrosis associated with the right kidney, age indeterminate. No other abnormalities. Electronically Signed   By: Dorise Bullion III M.D   On: 09/15/2019 17:51    Scheduled Meds: . amLODipine  5 mg Oral Daily  .  chlorhexidine gluconate (MEDLINE KIT)  15 mL Mouth Rinse BID  . feeding supplement (ENSURE ENLIVE)  237 mL Oral BID BM  . heparin  5,000 Units Subcutaneous Q8H  . insulin aspart  0-15 Units Subcutaneous Q4H  . insulin detemir  5 Units Subcutaneous Daily  . pantoprazole (PROTONIX) IV  40 mg Intravenous Daily   Continuous Infusions: . sodium chloride 10 mL/hr at 09/13/19 2200  . sodium chloride 75 mL/hr at 09/17/19 0020    Active Problems:   Acute encephalopathy   Acute metabolic encephalopathy   Acute on chronic renal failure (HCC)   Respiratory failure ([place])    Time spent: >25 minutes     Kinnie Feil  [facility] Pager [phone]. If 7PM-7AM, please contact night-coverage at [email or web address], password [facility] 09/17/2019, 10:11 AM  LOS: 8 days

## 2019-09-17 NOTE — Progress Notes (Signed)
Physical Therapy Treatment Patient Details Name: Amanda [name] MRN: [id number] DOB: [birth year] Today's Date: 09/17/2019    History of Present Illness Pt is a 68 yo female presenting with AMS. 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 Pt required intubation 9/21-9/25. PMH: DM, HTN, HLD    PT Comments    Pt mobility limited today by fatigue and generalized soreness. Required mod A for sit<>stand but could maintain standing <1 min at a time. Pivoted to recliner with RW and mod A. Performed LE there ex in sitting. Pt c/o dizziness in sitting and standing. Persisted with all changes in head position, relieved only by lying down. PT will continue to follow.    Follow Up Recommendations  CIR     Equipment Recommendations  Rolling walker with 5" wheels;3in1 (PT)    Recommendations for Other Services OT consult     Precautions / Restrictions Precautions Precautions: Fall Restrictions Weight Bearing Restrictions: No    Mobility  Bed Mobility Overal bed mobility: Needs Assistance Bed Mobility: Supine to Sit     Supine to sit: Min guard     General bed mobility comments: cues for initiation but pt able to get to EOB with use of rail  Transfers Overall transfer level: Needs assistance Equipment used: Rolling walker (2 wheeled) Transfers: Sit to/from [facility] Sit to Stand: Mod assist Stand pivot transfers: Mod assist       General transfer comment: mod A for power up. Performed 5x. Needed only min A last 2x from bed but mod A again needed when practicing from chair. Pt pivoted to recliner with RW and mod A for support. Pt could not maintain standing long enough to ambulate today due to fatigue and soreness  Ambulation/Gait                 Stairs             Wheelchair Mobility    Modified Rankin (Stroke Patients Only)       Balance  Overall balance assessment: Needs assistance Sitting-balance support: No upper extremity supported;Feet supported Sitting balance-Leahy Scale: Fair     Standing balance support: Bilateral upper extremity supported Standing balance-Leahy Scale: Poor                              Cognition Arousal/Alertness: Awake/alert Behavior During Therapy: WFL for tasks assessed/performed Overall Cognitive Status: Impaired/Different from baseline Area of Impairment: Orientation;Memory;Problem solving                 Orientation Level: Disoriented to;Situation   Memory: (amnesic to events leading to her admission)       Problem Solving: Slow processing General Comments: pt reports she did not remember she should not get up alone when she fell, just knew she had to go to the bathroom      Exercises General Exercises - Lower Extremity Ankle Circles/Pumps: AROM;Both;20 reps;Seated Hip Flexion/Marching: AROM;Both;10 reps;Seated    General Comments General comments (skin integrity, edema, etc.): pt c/o dizziness in sitting and standing. pt's daughter present and very upset about fall yesterday      Pertinent Vitals/Pain Pain Assessment: Faces Faces Pain Scale: Hurts little more Pain Location: generalized Pain Descriptors / Indicators: Aching Pain Intervention(s): Limited activity within patient's tolerance;Monitored during session    Home Living  Prior Function            PT Goals (current goals can now be found in the care plan section) Acute Rehab PT Goals Patient Stated Goal: walk bettter PT Goal Formulation: With patient Time For Goal Achievement: 09/29/19 Potential to Achieve Goals: Good Progress towards PT goals: Not progressing toward goals - comment(fatigue/ soreness)    Frequency    Min 3X/week      PT Plan Current plan remains appropriate    Co-evaluation              AM-PAC PT "6 Clicks" Mobility    Outcome Measure  Help needed turning from your back to your side while in a flat bed without using bedrails?: None Help needed moving from lying on your back to sitting on the side of a flat bed without using bedrails?: None Help needed moving to and from a bed to a chair (including a wheelchair)?: A Little Help needed standing up from a chair using your arms (e.g., wheelchair or bedside chair)?: A Lot Help needed to walk in hospital room?: Total Help needed climbing 3-5 steps with a railing? : Total 6 Click Score: 15    End of Session Equipment Utilized During Treatment: Gait belt Activity Tolerance: Patient tolerated treatment well Patient left: in chair;with call bell/phone within reach;with chair alarm set;with family/visitor present([employer]) Nurse Communication: Mobility status PT Visit Diagnosis: Unsteadiness on feet (R26.81);Other abnormalities of gait and mobility (R26.89)     Time: 8828-0034 PT Time Calculation (min) (ACUTE ONLY): 25 min  Charges:  $Gait Training: 8-22 mins $Therapeutic Activity: 8-22 mins                     Leighton Roach, [place]  Pager [phone] Office [place] 09/17/2019, 3:39 PM

## 2019-09-18 LAB — BASIC METABOLIC PANEL
Anion gap: 4 — ABNORMAL LOW (ref 5–15)
BUN: 24 mg/dL — ABNORMAL HIGH (ref 8–23)
CO2: 22 mmol/L (ref 22–32)
Calcium: 7.8 mg/dL — ABNORMAL LOW (ref 8.9–10.3)
Chloride: 118 mmol/L — ABNORMAL HIGH (ref 98–111)
Creatinine, Ser: 1.56 mg/dL — ABNORMAL HIGH (ref 0.44–1.00)
GFR calc Af Amer: 39 mL/min — ABNORMAL LOW (ref >60)
GFR calc non Af Amer: 34 mL/min — ABNORMAL LOW (ref >60)
Glucose, Bld: 112 mg/dL — ABNORMAL HIGH (ref 70–99)
Potassium: 3.8 mmol/L (ref 3.5–5.1)
Sodium: 144 mmol/L (ref 135–145)

## 2019-09-18 LAB — GLUCOSE, CAPILLARY
Glucose-Capillary: 120 mg/dL — ABNORMAL HIGH (ref 70–99)
Glucose-Capillary: 135 mg/dL — ABNORMAL HIGH (ref 70–99)
Glucose-Capillary: 176 mg/dL — ABNORMAL HIGH (ref 70–99)
Glucose-Capillary: 209 mg/dL — ABNORMAL HIGH (ref 70–99)
Glucose-Capillary: 245 mg/dL — ABNORMAL HIGH (ref 70–99)
Glucose-Capillary: 252 mg/dL — ABNORMAL HIGH (ref 70–99)

## 2019-09-18 MED ORDER — PANTOPRAZOLE SODIUM 40 MG PO PACK
40.0000 mg | PACK | Freq: Every day | ORAL | Status: DC
  Administered 2019-09-18 – 2019-09-24 (×7): 40 mg via ORAL
  Filled 2019-09-18 (×8): qty 20

## 2019-09-18 MED ORDER — SODIUM CHLORIDE 0.9 % IV SOLN
INTRAVENOUS | Status: DC
  Administered 2019-09-18: 13:00:00 via INTRAVENOUS

## 2019-09-18 MED ORDER — DOCUSATE SODIUM 50 MG/5ML PO LIQD
100.0000 mg | Freq: Every day | ORAL | Status: DC
  Administered 2019-09-18 – 2019-09-24 (×7): 100 mg via ORAL
  Filled 2019-09-18 (×7): qty 10

## 2019-09-18 NOTE — Plan of Care (Signed)
  Problem: Safety: Goal: Ability to remain free from injury will improve Outcome: Progressing   Problem: Skin Integrity: Goal: Risk for impaired skin integrity will decrease Outcome: Progressing   

## 2019-09-18 NOTE — Progress Notes (Addendum)
Pt's daughter, Chanel, in room talking to herself. Daughter seemed very agitated. Asked charge nurse, Diona Browner, to check on pt and pt's daughter was turning the lights on and off and talking to herself. Daughter then left the room and started pacing in the hallway, then left to go outside. While daughter was outside charge nurse called security to the floor. When the pt's daughter came back up to the unit security politely asked her to leave and escorted her out of the building. Provider notified.

## 2019-09-18 NOTE — Progress Notes (Signed)
Occupational Therapy Treatment Patient Details Name: Amanda [name] MRN: [id number] DOB: [birth year] Today's Date: 09/18/2019    History of present illness Pt is a 69 yo female presenting with AMS. 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 Pt required intubation 9/21-9/25. PMH: DM, HTN, HLD   OT comments  Unsure of daugthers ability to care for pt. SHe has to be escorted out by police prior to [employer].  Follow Up Recommendations  CIR;Supervision/Assistance - 24 hour    Equipment Recommendations  Other (comment)(TBD next venue)    Recommendations for Other Services      Precautions / Restrictions Precautions Precautions: Fall       Mobility Bed Mobility Overal bed mobility: Needs Assistance Bed Mobility: Supine to Sit;Sit to Supine     Supine to sit: Min assist(trunk) Sit to supine: Min assist(legs)      Transfers Overall transfer level: Needs assistance Equipment used: Rolling walker (2 wheeled) Transfers: Sit to/from [facility] Sit to Stand: Min assist Stand pivot transfers: Min assist       General transfer comment: Cues for safe hand placement and increased time    Balance Overall balance assessment: Needs assistance Sitting-balance support: No upper extremity supported;Feet supported Sitting balance-Leahy Scale: Fair     Standing balance support: Bilateral upper extremity supported Standing balance-Leahy Scale: Poor                             ADL either performed or assessed with clinical judgement   ADL Overall ADL's : Needs assistance/impaired     Grooming: Brushing hair;Cueing for safety;Cueing for sequencing Grooming Details (indicate cue type and reason): pt fatigued VERY quickly.  Needed max encouragement to participate.                 Toilet Transfer: Moderate assistance;Stand-pivot;Cueing for  safety;Squat-pivot   Toileting- Clothing Manipulation and Hygiene: Moderate assistance;Cueing for safety;Cueing for compensatory techniques;Sit to/from stand         General ADL Comments: pt fatigued quickly.  Pts daughter has to be police escorted out of room prior to OT session.               Cognition Arousal/Alertness: Awake/alert Behavior During Therapy: WFL for tasks assessed/performed Overall Cognitive Status: Within Functional Limits for tasks assessed                               Problem Solving: Slow processing General Comments: Pt was fully oriented today. Pt aware her daugther has to be escorted out of hospital by police          Pertinent Vitals/ Pain       Pain Assessment: No/denies pain         Frequency  Min 2X/week        Progress Toward Goals  OT Goals(current goals can now be found in the care plan section)  Progress towards OT goals: Progressing toward goals     Plan Discharge plan remains appropriate    Co-evaluation                 AM-PAC OT "6 Clicks" Daily Activity     Outcome Measure   Help from another person eating meals?: A Little(S due to swallowing precautions) Help from another person taking care of personal grooming?:  A Little Help from another person toileting, which includes using toliet, bedpan, or urinal?: A Lot Help from another person bathing (including washing, rinsing, drying)?: A Lot Help from another person to put on and taking off regular upper body clothing?: A Little Help from another person to put on and taking off regular lower body clothing?: A Lot 6 Click Score: 15    End of Session Equipment Utilized During Treatment: Gait belt;Rolling walker  OT Visit Diagnosis: Unsteadiness on feet (R26.81);Other abnormalities of gait and mobility (R26.89);Pain Pain - part of body: (head ache)   Activity Tolerance Patient tolerated treatment well   Patient Left in bed;with call bell/phone  within reach;with bed alarm set;with family/visitor present(dtr)   Nurse Communication Mobility status(Randy: no report in chart that patient fell, dtr informed me so I called RN to ask.RN reported safety zone completed. Also pt reported to me that she has a mild headache, that she did hit her head when she fell and headache started after she fell.)        Time: 1218-1228 OT Time Calculation (min): 10 min  Charges: OT General Charges $OT Visit: 1 Visit OT Treatments $Self Care/Home Management : 8-22 mins  Kari Baars, [place] [phone] Office- [place], Edwena Felty D 09/18/2019, 12:57 PM

## 2019-09-18 NOTE — Progress Notes (Signed)
Inpatient Diabetes Program Recommendations  AACE/ADA: New Consensus Statement on Inpatient Glycemic Control (2015)  Target Ranges:  Prepandial:   less than 140 mg/dL      Peak postprandial:   less than 180 mg/dL (1-2 hours)      Critically ill patients:  140 - 180 mg/dL   Lab Results  Component Value Date   GLUCAP 209 (H) 09/18/2019   HGBA1C 10.9 (H) 09/09/2019    Review of Glycemic Control Results for YACINE, DROZ (MRN [id number]) as of 09/18/2019 13:45  Ref. Range 09/18/2019 03:49 09/18/2019 08:26 09/18/2019 11:26  Glucose-Capillary Latest Ref Range: 70 - 99 mg/dL 120 (H) 176 (H) 209 (H)   Diabetes history: Type 2 DM Outpatient Diabetes medications: Ozempic 0.5 mg Qwk, Metformin 1000 mg BID Current orders for Inpatient glycemic control: Novolog 0-15 units TID, Levemir 5 units QD  Inpatient Diabetes Program Recommendations:   Spoke with patient regarding outpatient diabetes management. Patient is aware that Metformin will not be resumed at discharge.  Reviewed patient's current A1c of 10.9%. Explained what a A1c is and what it measures. Also reviewed goal A1c with patient, importance of good glucose control @ home, and blood sugar goals. Reviewed patho of DM, hypo vs hyperglycemia, interventions, role of pancreas, vascular changes and commorbidities.  Patient has a meter and was checking usually once per day. Education provided on the importance of checking more frequently and reviewed frequency and target goals. Patient is aware of when to reach out to MD.  Patient admits to recently drinking sodas. Reviewed alternatives for sugary beverages, plate methods and importance of choosing food lower in carbohdyrates. Discussed following up with PCP following admission and reviewed role of endocrinology. List attached to DC summary.    In the event patient is started on insulin, patient has been shown insulin pen. Educated patient and spouse on insulin pen use at home. Reviewed contents of  insulin flexpen starter kit. Reviewed all steps if insulin pen including attachment of needle, 2-unit air shot, dialing up dose, giving injection, removing needle, disposal of sharps, storage of unused insulin, disposal of insulin etc. Patient able to provide successful return demonstration. Also reviewed troubleshooting with insulin pen. MD to give patient Rxs for insulin pens and insulin pen needles.  Thanks, Bronson Curb, MSN, RNC-OB Diabetes Coordinator [phone] (8a-5p)

## 2019-09-18 NOTE — Progress Notes (Signed)
PROGRESS NOTE    Amanda [name]  IFO:[id number] DOB: [birth year] DOA: 09/09/2019 PCP: [name], MD   Brief Narrative:  68 year old female with history of diabetes, HTN, and neuropathy presenting from home with altered mental status. EMS had reported that patient was found alert with eyes open, not following commands, and left arm contracted in at the base of her disabled husband bed covered in feces. Husband not ale to provide any insight on scene or last seen well. Neighbors had reported that her car door had been opened since last night and front door found open. Found to glucose reading high. In ER, patient febrile 101.9 rectal, hypertensive 207/104, and tachycardic. CT head and CTA head and neck without acute findings. Labs noted for Na 139, CO2 21, glucose 522, BUN 36, sCr 1.87, CK 125, LA 2.6->3, WBC 14.4, normal coags, UA with high glucose, 5 ketones, >300 protein. She required intubation for airway protection after some desaturation episodes in the 80's. Neurology consulted. Cultures sent and started on empiric vancomycin, cefepime, and flagyl and ceftriaxone add for meningeal coverage. 30 cc/kg fluid bolus given. EEG showing negative for seizure activity, showing encephalopathy. LPcultures were negative. CSF HSV DNA was negative.[name] PCCMadmitted the patient as the patient was on mechanical ventilation. She was extubated on 09/13/2019. Patient was transferred to the hospital service on 09/14/2019. Patient seen by neurology. Mental status slowly improving.  PT OT continues to follow, she will need CIR placement  Subjective: Alert,awake oriented to place,date. Reports she fell at home but doesn't remember the detial. reports feeling weak. Slept well. Appears interactive but still with confusion   Assessment & Plan:  Acute Encephalopathy, metabolic:stil confused some. Seen by NeuRo-MRI suspicious for PRES, although findings could also be suggestive of CO poisoning and  severe hypoglycemic brain injury, likely multifactorial acute on chronic renal failure, PRES,HONK, hypertensive encephalopathy possible infectious process, (aspiration).  EEG with severe diffuse encephalopathy no seizure, ammonia 22, pro-Cal 6.4, B12 2048 folate 14 , UA negative for pyuria 9/22 mentation gradually improving.  Supportive care, fall precaution.  Planning for CIR  Rhabdomyolysis CK up to 2271 -improved to 309.  Cont gentle hydration.    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 continue to hold lisinopril  Malignant hypertension blood pressure fairly stable now.  Lisinopril on hold.  Started on amlodipine.  Continue to monitor blood pressure can add Coreg if needed.  SIRS finished 5 days of cefepime to cover possible aspiration pneumonitis.  Blood culture no growth to date.  Dysphagia speech on board continue dysphagia 1 diet continuous aspiration precaution.  Diabetes mellitus type 2 with uncontrolled hyperglycemia A1c 10.9 on 9/21.  Metformin, Ozempic remains in hold.  Patient started on Lantus 5 units, metformin discontinued, not on insulin prior to admission.  Continue sliding scale insulin, monitor blood sugar. Now appears stable.  Deconditioning/Debility: continue PT OT looking into CIR placement  DVT prophylaxis:Heparin Code Status: full Family Communication: plan of care discussed with patient in detail.  Patient's daughter updated today at the bedside by the staff. Disposition PlanCIR placement PENDING.  Still remains confused some.   Consultants:   PCCM  Neurology    Procedures: MRI  Microbiology: NONE Antimicrobials: Anti-infectives (From admission, onward)   Start     Dose/Rate Route Frequency Ordered Stop   09/11/19 0600  acyclovir (ZOVIRAX) 500 mg in dextrose 5 % 100 mL  IVPB  Status:  Discontinued     500 mg 110 mL/hr over  60 Minutes Intravenous Every 24 hours 09/10/19 0641 09/12/19 0932   09/10/19 1800  ceFEPIme (MAXIPIME) 2 g in sodium chloride 0.9 % 100 mL IVPB  Status:  Discontinued     2 g 200 mL/hr over 30 Minutes Intravenous Every 24 hours 09/09/19 1835 09/10/19 0612   09/10/19 1800  ceFEPIme (MAXIPIME) 2 g in sodium chloride 0.9 % 100 mL IVPB     2 g 200 mL/hr over 30 Minutes Intravenous Every 24 hours 09/10/19 0612 09/14/19 1726   09/10/19 1330  vancomycin (VANCOCIN) IVPB 1000 mg/200 mL premix  Status:  Discontinued     1,000 mg 200 mL/hr over 60 Minutes Intravenous Every 48 hours 09/10/19 1251 09/12/19 0905   09/10/19 0645  acyclovir (ZOVIRAX) 500 mg in dextrose 5 % 100 mL IVPB     500 mg 110 mL/hr over 60 Minutes Intravenous  Once 09/10/19 0641 09/10/19 0828   09/09/19 2100  cefTRIAXone (ROCEPHIN) 2 g in sodium chloride 0.9 % 100 mL IVPB     2 g 200 mL/hr over 30 Minutes Intravenous  Once 09/09/19 2056 09/09/19 2159   09/09/19 1834  vancomycin variable dose per unstable renal function (pharmacist dosing)  Status:  Discontinued      Does not apply See admin instructions 09/09/19 1835 09/10/19 1251   09/09/19 1645  ceFEPIme (MAXIPIME) 2 g in sodium chloride 0.9 % 100 mL IVPB     2 g 200 mL/hr over 30 Minutes Intravenous  Once 09/09/19 1638 09/09/19 1736   09/09/19 1645  metroNIDAZOLE (FLAGYL) IVPB 500 mg     500 mg 100 mL/hr over 60 Minutes Intravenous  Once 09/09/19 1638 09/09/19 1900   09/09/19 1645  vancomycin (VANCOCIN) IVPB 1000 mg/200 mL premix     1,000 mg 200 mL/hr over 60 Minutes Intravenous  Once 09/09/19 1638 09/09/19 1900       Objective: Vitals:   09/17/19 1948 09/18/19 0350 09/18/19 0500 09/18/19 0828  BP: (!) 114/57 (!) 144/71  (!) 155/71  Pulse: 89 80  91  Resp: '18 18  16  ' Temp: 99.2 F (37.3 C) 98.8 F (37.1 C)  100 F (37.8 C)  TempSrc: Oral Oral  Oral  SpO2: 99% 97%  99%  Weight:   58.6 kg   Height:        Intake/Output Summary (Last 24 hours) at 09/18/2019 0946  Last data filed at 09/17/2019 1500 Gross per 24 hour  Intake 240 ml  Output -  Net 240 ml   Filed Weights   09/13/19 0500 09/17/19 0500 09/18/19 0500  Weight: 56.8 kg 59 kg 58.6 kg   Weight change: -0.368 kg  Body mass index is 22.18 kg/m.  Intake/Output from previous day: 09/29 0701 - 09/30 0700 In: 360 [P.O.:360] Out: -  Intake/Output this shift: No intake/output data recorded.  Examination:  General exam: Appears calm and comfortable, weak, older than her age, on ra HEENT:PERRL,Oral mucosa moist, Ear/Nose normal on gross exam Respiratory system: Bilateral equal air entry, normal vesicular breath sounds, no wheezes or crackles  Cardiovascular system: S1 & S2 heard,No JVD, murmurs. Gastrointestinal system: Abdomen is  soft, non tender, non distended, BS +  Nervous System:Alert and oriented t x3. No focal neurological deficits/moving extremities, sensation intact. Extremities: No edema, no clubbing, distal peripheral pulses palpable. Skin: No rashes, lesions, no icterus MSK: Normal muscle bulk,tone ,power  Medications:  Scheduled Meds: . amLODipine  5 mg Oral Daily  . chlorhexidine gluconate (MEDLINE KIT)  15 mL Mouth Rinse BID  . docusate  100 mg Oral Daily  . feeding supplement (ENSURE ENLIVE)  237 mL Oral BID BM  . heparin  5,000 Units Subcutaneous Q8H  . insulin aspart  0-15 Units Subcutaneous Q4H  . insulin detemir  5 Units Subcutaneous Daily  . pantoprazole sodium  40 mg Oral Daily   Continuous Infusions: . sodium chloride 10 mL/hr at 09/13/19 2200  . sodium chloride      Data Reviewed: I have personally reviewed following labs and imaging studies  CBC: Recent Labs  Lab 09/12/19 0434 09/13/19 0541 09/14/19 0400 09/16/19 0313  WBC 10.6* 12.1* 10.7* 8.8  NEUTROABS 8.6* 9.5* 7.4  --   HGB 8.0* 9.9* 9.6* 8.0*  HCT 23.3* 29.8* 29.0* 24.3*  MCV 91.4 94.9 93.5 93.1  PLT 156 181 291 638   Basic Metabolic Panel: Recent Labs  Lab 09/11/19 1620   09/12/19 0434 09/13/19 0541 09/14/19 0400 09/15/19 0347 09/16/19 0313 09/17/19 0536 09/18/19 0222  NA  --    < > 148* 143 143 146* 146* 143 144  K  --    < > 3.0* 4.6 4.0 4.3 3.6 3.2* 3.8  CL  --    < > 120* 115* 117* 119* 121* 111 118*  CO2  --    < > 20* 17* 16* 16* 21* 21* 22  GLUCOSE  --    < > 101* 150* 125* 189* 107* 133* 112*  BUN  --    < > 40* 41* 35* 29* 20 20 24*  CREATININE  --    < > 1.71* 1.61* 1.73* 1.74* 1.28* 1.49* 1.56*  CALCIUM  --    < > 7.4* 8.3* 8.3* 8.2* 8.1* 8.1* 7.8*  MG 2.5*  --  2.3 2.4 2.2  --   --   --   --   PHOS 3.1  --  3.2 3.6 3.3  --   --   --   --    < > = values in this interval not displayed.   GFR: Estimated Creatinine Clearance: 30.2 mL/min (A) (by C-G formula based on SCr of 1.56 mg/dL (H)). Liver Function Tests: Recent Labs  Lab 09/13/19 0541 09/16/19 0313  AST  --  29  ALT  --  32  ALKPHOS  --  54  BILITOT  --  0.5  PROT  --  5.0*  ALBUMIN 2.5* 2.2*   No results for input(s): LIPASE, AMYLASE in the last 168 hours. No results for input(s): AMMONIA in the last 168 hours. Coagulation Profile: No results for input(s): INR, PROTIME in the last 168 hours. Cardiac Enzymes: Recent Labs  Lab 09/12/19 0434 09/13/19 1119 09/14/19 0400 09/16/19 0313  CKTOTAL 1,454* 655* 769* 309*  CKMB  --   --  7.2*  --    BNP (last 3 results) No results for input(s): PROBNP in the last 8760 hours. HbA1C: No results for input(s): HGBA1C in the last 72 hours. CBG: Recent Labs  Lab 09/17/19 1614 09/17/19 1947 09/17/19 2333 09/18/19 0349 09/18/19 0826  GLUCAP 271* 287* 96 120* 176*   Lipid Profile: No results for input(s): CHOL, HDL, LDLCALC, TRIG, CHOLHDL, LDLDIRECT in the last 72 hours. Thyroid Function Tests: No results for input(s): TSH, T4TOTAL, FREET4, T3FREE, THYROIDAB in the last 72 hours. Anemia Panel: No results for input(s): VITAMINB12, FOLATE, FERRITIN, TIBC, IRON, RETICCTPCT in the last 72 hours. Sepsis Labs: Recent  Labs   Lab 09/16/19 0313  PROCALCITON 4.25    Recent Results (from the past 240 hour(s))  SARS Coronavirus 2 [facility] order, Performed in [facility] hospital lab) Nasopharyngeal Urine, Catheterized     Status: None   Collection Time: 09/09/19  4:52 PM   Specimen: Urine, Catheterized; Nasopharyngeal  Result Value Ref Range Status   SARS Coronavirus 2 NEGATIVE NEGATIVE Final    Comment: (NOTE) If result is NEGATIVE SARS-CoV-2 target nucleic acids are NOT DETECTED. The SARS-CoV-2 RNA is generally detectable in upper and lower  respiratory specimens during the acute phase of infection. The lowest  concentration of SARS-CoV-2 viral copies this assay can detect is 250  copies / mL. A negative result does not preclude SARS-CoV-2 infection  and should not be used as the sole basis for treatment or other  patient management decisions.  A negative result may occur with  improper specimen collection / handling, submission of specimen other  than nasopharyngeal swab, presence of viral mutation(s) within the  areas targeted by this assay, and inadequate number of viral copies  (<250 copies / mL). A negative result must be combined with clinical  observations, patient history, and epidemiological information. If result is POSITIVE SARS-CoV-2 target nucleic acids are DETECTED. The SARS-CoV-2 RNA is generally detectable in upper and lower  respiratory specimens dur ing the acute phase of infection.  Positive  results are indicative of active infection with SARS-CoV-2.  Clinical  correlation with patient history and other diagnostic information is  necessary to determine patient infection status.  Positive results do  not rule out bacterial infection or co-infection with other viruses. If result is PRESUMPTIVE POSTIVE SARS-CoV-2 nucleic acids MAY BE PRESENT.   A presumptive positive result was obtained on the submitted specimen  and confirmed on repeat testing.  While 2019 novel coronavirus   (SARS-CoV-2) nucleic acids may be present in the submitted sample  additional confirmatory testing may be necessary for epidemiological  and / or clinical management purposes  to differentiate between  SARS-CoV-2 and other Sarbecovirus currently known to infect humans.  If clinically indicated additional testing with an alternate test  methodology [phone]) is advised. The SARS-CoV-2 RNA is generally  detectable in upper and lower respiratory sp ecimens during the acute  phase of infection. The expected result is Negative. Fact Sheet for Patients:  StrictlyIdeas.no Fact Sheet for Healthcare Providers: BankingDealers.co.za This test is not yet approved or cleared by the [place] FDA and has been authorized for detection and/or diagnosis of SARS-CoV-2 by FDA under an Emergency Use Authorization (EUA).  This EUA will remain in effect (meaning this test can be used) for the duration of the COVID-19 declaration under Section 564(b)(1) of the Act, 21 U.S.C. section 360bbb-3(b)(1), unless the authorization is terminated or revoked sooner. Performed at [facility], [place] [address] [place], [place] [address]   Urine Culture     Status: None   Collection Time: 09/09/19  4:52 PM   Specimen: Urine, Catheterized  Result Value Ref Range Status   Specimen Description URINE, CATHETERIZED  Final   Special Requests NONE  Final   Culture   Final    NO GROWTH Performed at [facility], [address], [place], [place] [address]    Report Status 09/10/2019 FINAL  Final  Blood Culture (routine x 2)     Status: None   Collection Time: 09/09/19  5:08 PM   Specimen: BLOOD  Result Value Ref Range Status  Specimen Description BLOOD LEFT ANTECUBITAL  Final   Special Requests   Final    BOTTLES DRAWN AEROBIC AND ANAEROBIC Blood Culture results may not be optimal due to an excessive volume of blood received in culture bottles   Culture    Final    NO GROWTH 5 DAYS Performed at [facility], [place] [address], [place], [place] [address]    Report Status 09/14/2019 FINAL  Final  Blood Culture (routine x 2)     Status: None   Collection Time: 09/09/19  5:10 PM   Specimen: BLOOD  Result Value Ref Range Status   Specimen Description BLOOD BLOOD RIGHT FOREARM  Final   Special Requests   Final    BOTTLES DRAWN AEROBIC AND ANAEROBIC Blood Culture adequate volume   Culture   Final    NO GROWTH 5 DAYS Performed at [facility], [place] [address], [place], [place] [address]    Report Status 09/14/2019 FINAL  Final  CSF culture     Status: None   Collection Time: 09/10/19 12:02 AM   Specimen: CSF; Cerebrospinal Fluid  Result Value Ref Range Status   Specimen Description CSF  Final   Special Requests NONE  Final   Gram Stain   Final    WBC PRESENT,BOTH PMN AND MONONUCLEAR NO ORGANISMS SEEN CYTOSPIN SMEAR    Culture   Final    NO GROWTH 3 DAYS Performed at [facility], [place] [address] [place], [place] [address]    Report Status 09/13/2019 FINAL  Final  MRSA PCR Screening     Status: None   Collection Time: 09/10/19 12:54 AM  Result Value Ref Range Status   MRSA by PCR NEGATIVE NEGATIVE Final    Comment:        The GeneXpert MRSA Assay (FDA approved for NASAL specimens only), is one component of a comprehensive MRSA colonization surveillance program. It is not intended to diagnose MRSA infection nor to guide or monitor treatment for MRSA infections. Performed at [facility], [place] [address] [place], [place] [address]   Respiratory Panel by PCR     Status: None   Collection Time: 09/11/19 10:45 AM   Specimen: Nasopharyngeal Swab; Respiratory  Result Value Ref Range Status   Adenovirus NOT DETECTED NOT DETECTED Final   Coronavirus 229E NOT DETECTED NOT DETECTED Final    Comment: (NOTE) The Coronavirus on the Respiratory Panel, DOES NOT test for the novel  Coronavirus (2019 nCoV)     Coronavirus HKU1 NOT DETECTED NOT DETECTED Final   Coronavirus NL63 NOT DETECTED NOT DETECTED Final   Coronavirus OC43 NOT DETECTED NOT DETECTED Final   Metapneumovirus NOT DETECTED NOT DETECTED Final   Rhinovirus / Enterovirus NOT DETECTED NOT DETECTED Final   Influenza A NOT DETECTED NOT DETECTED Final   Influenza B NOT DETECTED NOT DETECTED Final   Parainfluenza Virus 1 NOT DETECTED NOT DETECTED Final   Parainfluenza Virus 2 NOT DETECTED NOT DETECTED Final   Parainfluenza Virus 3 NOT DETECTED NOT DETECTED Final   Parainfluenza Virus 4 NOT DETECTED NOT DETECTED Final   Respiratory Syncytial Virus NOT DETECTED NOT DETECTED Final   Bordetella pertussis NOT DETECTED NOT DETECTED Final   Chlamydophila pneumoniae NOT DETECTED NOT DETECTED Final   Mycoplasma pneumoniae NOT DETECTED NOT DETECTED Final    Comment: Performed at [facility], [place], [place], [place] [address]  Culture, Urine     Status: None   Collection  Time: 09/15/19  8:46 AM   Specimen: Urine, Random  Result Value Ref Range Status   Specimen Description URINE, RANDOM  Final   Special Requests NONE  Final   Culture   Final    NO GROWTH Performed at [facility], [address] [place], [place] [address]    Report Status 09/16/2019 FINAL  Final      Radiology Studies: No results found.    LOS: 9 days   Time spent: More than 50% of that time was spent in counseling and/or coordination of care.  Antonieta Pert, MD [facility]  09/18/2019, 9:46 AM

## 2019-09-18 NOTE — Progress Notes (Signed)
Inpatient Rehab Admissions Coordinator:   Notified by patient's insurance they inpatient rehab was not approved.  Pt will need SNF if family cannot provide assist at home.  Will sign off at this time.   Shann Medal, PT, DPT Admissions Coordinator [phone] 09/18/19  1:37 PM

## 2019-09-19 LAB — BASIC METABOLIC PANEL
Anion gap: 7 (ref 5–15)
BUN: 23 mg/dL (ref 8–23)
CO2: 24 mmol/L (ref 22–32)
Calcium: 8 mg/dL — ABNORMAL LOW (ref 8.9–10.3)
Chloride: 116 mmol/L — ABNORMAL HIGH (ref 98–111)
Creatinine, Ser: 1.27 mg/dL — ABNORMAL HIGH (ref 0.44–1.00)
GFR calc Af Amer: 51 mL/min — ABNORMAL LOW (ref >60)
GFR calc non Af Amer: 44 mL/min — ABNORMAL LOW (ref >60)
Glucose, Bld: 115 mg/dL — ABNORMAL HIGH (ref 70–99)
Potassium: 3.5 mmol/L (ref 3.5–5.1)
Sodium: 147 mmol/L — ABNORMAL HIGH (ref 135–145)

## 2019-09-19 LAB — GLUCOSE, CAPILLARY
Glucose-Capillary: 127 mg/dL — ABNORMAL HIGH (ref 70–99)
Glucose-Capillary: 133 mg/dL — ABNORMAL HIGH (ref 70–99)
Glucose-Capillary: 195 mg/dL — ABNORMAL HIGH (ref 70–99)
Glucose-Capillary: 280 mg/dL — ABNORMAL HIGH (ref 70–99)
Glucose-Capillary: 172 mg/dL — ABNORMAL HIGH (ref 70–99)

## 2019-09-19 LAB — CBC
HCT: 25.8 % — ABNORMAL LOW (ref 36.0–46.0)
Hemoglobin: 8.1 g/dL — ABNORMAL LOW (ref 12.0–15.0)
MCH: 30.1 pg (ref 26.0–34.0)
MCHC: 31.4 g/dL (ref 30.0–36.0)
MCV: 95.9 fL (ref 80.0–100.0)
Platelets: 273 10*3/uL (ref 150–400)
RBC: 2.69 MIL/uL — ABNORMAL LOW (ref 3.87–5.11)
RDW: 12.8 % (ref 11.5–15.5)
WBC: 6.4 10*3/uL (ref 4.0–10.5)
nRBC: 0 % (ref 0.0–0.2)

## 2019-09-19 MED ORDER — CARVEDILOL 3.125 MG PO TABS
3.1250 mg | ORAL_TABLET | Freq: Two times a day (BID) | ORAL | Status: DC
  Administered 2019-09-19 – 2019-09-20 (×2): 3.125 mg via ORAL
  Filled 2019-09-19 (×2): qty 1

## 2019-09-19 NOTE — Progress Notes (Signed)
PROGRESS NOTE    Amanda [name]  SHF:[id number] DOB: [birth year] DOA: 09/09/2019 PCP: [name], MD   Brief Narrative:  Patient is 68 year old female with history of diabetes, HTN, and neuropathy presented to the hospital from home with altered mental status. EMS had reported that patient was found alert with eyes open, not following commands, and left arm contracted in at the base of her disabled husband bed covered in feces. Husband not able to provide any insight on scene or last seen well. Neighbors had reported that her car door had been opened since last night and front door found open. Found to glucose reading high.   In ER, patient febrile 101.9 rectal, hypertensive 207/104, and tachycardic. CT head and CTA head and neck without acute findings. Labs noted for Na 139, CO2 21, glucose 522, BUN 36, sCr 1.87, CK 125, LA 2.6->3, WBC 14.4, normal coags, UA with high glucose, 5 ketones, >300 protein. She required intubation for airway protection after some desaturation episodes in the 80's. Neurology consulted. Cultures sent and started on empiric vancomycin, cefepime, and flagyl and ceftriaxone add for meningeal coverage. 30 cc/kg fluid bolus given. EEG showing negative for seizure activity, showing encephalopathy. LPcultures were negative. CSF HSV DNA was negative.[name] PCCMadmitted the patient as the patient was on mechanical ventilation. She was extubated on 09/13/2019. Patient was transferred to the hospital service on 09/14/2019. Patient seen by neurology. Mental status slowly improving.  PT OT continues to follow.  Subjective: Patient is more alert awake and communicative.  Complains of generalized weakness.  She denies any chest pain, shortness of breath, fever or chills.    Assessment & Plan:  Acute metabolic encephalopathy: Improved, MRI suspicious for PRES, although findings could also be suggestive of CO poisoning and severe hypoglycemic brain injury, likely  multifactorial acute on chronic renal failure, PRES,HONK, hypertensive encephalopathy possible infectious process, (aspiration).  EEG with severe diffuse encephalopathy no seizure, ammonia 22, pro-Cal 6.4, UA negative for pyuria.   Rhabdomyolysis CK up to 2271 -improved to 309.  Received IV fluid hydration while in the hospital.  Will discontinue IV fluid today.  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  We will continue to hold ACE inhibitor's.  Discontinue IV fluids.  Malignant hypertension blood pressure is much controlled at this time.  Lisinopril on hold.  Patient has been started on amlodipine.  Will add a low-dose Coreg today.  Discontinue normal saline.  SIRS finished 5 days of cefepime to cover possible aspiration pneumonitis.  Blood culture no growth to date.  Dysphagia -speech on board continue dysphagia 1 diet, continuous aspiration precaution.  Advance diet as per progress.  Diabetes mellitus type 2 with uncontrolled hyperglycemia A1c 10.9 on 9/21.  Metformin, Ozempic remains in hold.  Patient was started on Lantus 5 units, not on insulin prior to admission.  Continue sliding scale insulin, Lantus.  Blood glucose levels are relatively controlled.  Last blood glucose level of 150  Deconditioning/Debility: continue PT OT, patient has been denied by inpatient rehab.  Consider skilled nursing facility placement.  DVT prophylaxis:Heparin subcu  Code Status: full  Family Communication: None today  Disposition Plan   Denied by CIR.  Will need to consider skilled nursing facility placement.   Consultants:   PCCM  Neurology   CIR  Procedures: MRI Intubation and mechanical ventilation Lumbar puncture EEG  Microbiology: CSF culture negative.   Respiratory virus panel negative Urine  culture negative Blood culture negative  Antimicrobials: Anti-infectives (From admission, onward)   Start      Dose/Rate Route Frequency Ordered Stop   09/11/19 0600  acyclovir (ZOVIRAX) 500 mg in dextrose 5 % 100 mL IVPB  Status:  Discontinued     500 mg 110 mL/hr over 60 Minutes Intravenous Every 24 hours 09/10/19 0641 09/12/19 0932   09/10/19 1800  ceFEPIme (MAXIPIME) 2 g in sodium chloride 0.9 % 100 mL IVPB  Status:  Discontinued     2 g 200 mL/hr over 30 Minutes Intravenous Every 24 hours 09/09/19 1835 09/10/19 0612   09/10/19 1800  ceFEPIme (MAXIPIME) 2 g in sodium chloride 0.9 % 100 mL IVPB     2 g 200 mL/hr over 30 Minutes Intravenous Every 24 hours 09/10/19 0612 09/14/19 1726   09/10/19 1330  vancomycin (VANCOCIN) IVPB 1000 mg/200 mL premix  Status:  Discontinued     1,000 mg 200 mL/hr over 60 Minutes Intravenous Every 48 hours 09/10/19 1251 09/12/19 0905   09/10/19 0645  acyclovir (ZOVIRAX) 500 mg in dextrose 5 % 100 mL IVPB     500 mg 110 mL/hr over 60 Minutes Intravenous  Once 09/10/19 0641 09/10/19 0828   09/09/19 2100  cefTRIAXone (ROCEPHIN) 2 g in sodium chloride 0.9 % 100 mL IVPB     2 g 200 mL/hr over 30 Minutes Intravenous  Once 09/09/19 2056 09/09/19 2159   09/09/19 1834  vancomycin variable dose per unstable renal function (pharmacist dosing)  Status:  Discontinued      Does not apply See admin instructions 09/09/19 1835 09/10/19 1251   09/09/19 1645  ceFEPIme (MAXIPIME) 2 g in sodium chloride 0.9 % 100 mL IVPB     2 g 200 mL/hr over 30 Minutes Intravenous  Once 09/09/19 1638 09/09/19 1736   09/09/19 1645  metroNIDAZOLE (FLAGYL) IVPB 500 mg     500 mg 100 mL/hr over 60 Minutes Intravenous  Once 09/09/19 1638 09/09/19 1900   09/09/19 1645  vancomycin (VANCOCIN) IVPB 1000 mg/200 mL premix     1,000 mg 200 mL/hr over 60 Minutes Intravenous  Once 09/09/19 1638 09/09/19 1900      Objective: Vitals:   09/18/19 1440 09/18/19 1949 09/19/19 0435 09/19/19 0838  BP: 137/65 137/68 (!) 142/74 (!) 165/80  Pulse: 85 85 83 92  Resp: _0 Temp: 98.7 F (37.1 C) 99.3 F (37.4  C) 99 F (37.2 C) 98.9 F (37.2 C)  TempSrc: Oral Oral Oral Oral  SpO2: 96% 97% 99% 98%  Weight:      Height:        Intake/Output Summary (Last 24 hours) at 09/19/2019 1136 Last data filed at 09/19/2019 0447 Gross per 24 hour  Intake 2145.64 ml  Output -  Net 2145.64 ml   Filed Weights   09/13/19 0500 09/17/19 0500 09/18/19 0500  Weight: 56.8 kg 59 kg 58.6 kg   Weight change:   Body mass index is 22.18 kg/m.  Intake/Output from previous day: 09/30 0701 - 10/01 0700 In: 2385.6 [P.O.:720; I.V.:1665.6] Out: -  Intake/Output this shift: No intake/output data recorded.  Examination:  General:  Average built, not in obvious distress, older than her stated age, thinly built HENT: Normocephalic, pupils equally reacting to light and accommodation.  No scleral pallor or icterus noted. Oral mucosa is moist.  Chest:  Clear breath sounds.  Diminished breath sounds bilaterally. No crackles or wheezes.  CVS: S1 &S2 heard. No murmur.  Regular rate  and rhythm. Abdomen: Soft, nontender, nondistended.  Bowel sounds are heard.  Liver is not palpable, no abdominal mass palpated Extremities: No cyanosis, clubbing or edema.  Peripheral pulses are palpable. Psych: Alert, awake and oriented, CNS:  No cranial nerve deficits.  Moving all extremities , mild right-sided weakness skin: Warm and dry.  No rashes noted.   Medications:  Scheduled Meds: . amLODipine  5 mg Oral Daily  . chlorhexidine gluconate (MEDLINE KIT)  15 mL Mouth Rinse BID  . docusate  100 mg Oral Daily  . feeding supplement (ENSURE ENLIVE)  237 mL Oral BID BM  . heparin  5,000 Units Subcutaneous Q8H  . insulin aspart  0-15 Units Subcutaneous Q4H  . insulin detemir  5 Units Subcutaneous Daily  . pantoprazole sodium  40 mg Oral Daily   Continuous Infusions: . sodium chloride 10 mL/hr at 09/13/19 2200  . sodium chloride 75 mL/hr at 09/18/19 1234    Data Reviewed: I have personally reviewed following labs and imaging  studies  CBC: Recent Labs  Lab 09/13/19 0541 09/14/19 0400 09/16/19 0313 09/19/19 0221  WBC 12.1* 10.7* 8.8 6.4  NEUTROABS 9.5* 7.4  --   --   HGB 9.9* 9.6* 8.0* 8.1*  HCT 29.8* 29.0* 24.3* 25.8*  MCV 94.9 93.5 93.1 95.9  PLT 181 291 245 800   Basic Metabolic Panel: Recent Labs  Lab 09/13/19 0541 09/14/19 0400 09/15/19 0347 09/16/19 0313 09/17/19 0536 09/18/19 0222 09/19/19 0221  NA 143 143 146* 146* 143 144 147*  K 4.6 4.0 4.3 3.6 3.2* 3.8 3.5  CL 115* 117* 119* 121* 111 118* 116*  CO2 17* 16* 16* 21* 21* 22 24  GLUCOSE 150* 125* 189* 107* 133* 112* 115*  BUN 41* 35* 29* 20 20 24* 23  CREATININE 1.61* 1.73* 1.74* 1.28* 1.49* 1.56* 1.27*  CALCIUM 8.3* 8.3* 8.2* 8.1* 8.1* 7.8* 8.0*  MG 2.4 2.2  --   --   --   --   --   PHOS 3.6 3.3  --   --   --   --   --    GFR: Estimated Creatinine Clearance: 37.1 mL/min (A) (by C-G formula based on SCr of 1.27 mg/dL (H)). Liver Function Tests: Recent Labs  Lab 09/13/19 0541 09/16/19 0313  AST  --  29  ALT  --  32  ALKPHOS  --  54  BILITOT  --  0.5  PROT  --  5.0*  ALBUMIN 2.5* 2.2*   No results for input(s): LIPASE, AMYLASE in the last 168 hours. No results for input(s): AMMONIA in the last 168 hours. Coagulation Profile: No results for input(s): INR, PROTIME in the last 168 hours. Cardiac Enzymes: Recent Labs  Lab 09/13/19 1119 09/14/19 0400 09/16/19 0313  CKTOTAL 655* 769* 309*  CKMB  --  7.2*  --    BNP (last 3 results) No results for input(s): PROBNP in the last 8760 hours. HbA1C: No results for input(s): HGBA1C in the last 72 hours. CBG: Recent Labs  Lab 09/18/19 1608 09/18/19 1950 09/18/19 2342 09/19/19 0437 09/19/19 0824  GLUCAP 245* 252* 135* 127* 133*   Lipid Profile: No results for input(s): CHOL, HDL, LDLCALC, TRIG, CHOLHDL, LDLDIRECT in the last 72 hours. Thyroid Function Tests: No results for input(s): TSH, T4TOTAL, FREET4, T3FREE, THYROIDAB in the last 72 hours. Anemia Panel: No  results for input(s): VITAMINB12, FOLATE, FERRITIN, TIBC, IRON, RETICCTPCT in the last 72 hours. Sepsis Labs: Recent Labs  Lab 09/16/19 [place]  4.25    Recent Results (from the past 240 hour(s))  SARS Coronavirus 2 [facility] order, Performed in [facility] hospital lab) Nasopharyngeal Urine, Catheterized     Status: None   Collection Time: 09/09/19  4:52 PM   Specimen: Urine, Catheterized; Nasopharyngeal  Result Value Ref Range Status   SARS Coronavirus 2 NEGATIVE NEGATIVE Final    Comment: (NOTE) If result is NEGATIVE SARS-CoV-2 target nucleic acids are NOT DETECTED. The SARS-CoV-2 RNA is generally detectable in upper and lower  respiratory specimens during the acute phase of infection. The lowest  concentration of SARS-CoV-2 viral copies this assay can detect is 250  copies / mL. A negative result does not preclude SARS-CoV-2 infection  and should not be used as the sole basis for treatment or other  patient management decisions.  A negative result may occur with  improper specimen collection / handling, submission of specimen other  than nasopharyngeal swab, presence of viral mutation(s) within the  areas targeted by this assay, and inadequate number of viral copies  (<250 copies / mL). A negative result must be combined with clinical  observations, patient history, and epidemiological information. If result is POSITIVE SARS-CoV-2 target nucleic acids are DETECTED. The SARS-CoV-2 RNA is generally detectable in upper and lower  respiratory specimens dur ing the acute phase of infection.  Positive  results are indicative of active infection with SARS-CoV-2.  Clinical  correlation with patient history and other diagnostic information is  necessary to determine patient infection status.  Positive results do  not rule out bacterial infection or co-infection with other viruses. If result is PRESUMPTIVE POSTIVE SARS-CoV-2 nucleic acids MAY BE PRESENT.   A presumptive  positive result was obtained on the submitted specimen  and confirmed on repeat testing.  While 2019 novel coronavirus  (SARS-CoV-2) nucleic acids may be present in the submitted sample  additional confirmatory testing may be necessary for epidemiological  and / or clinical management purposes  to differentiate between  SARS-CoV-2 and other Sarbecovirus currently known to infect humans.  If clinically indicated additional testing with an alternate test  methodology [phone]) is advised. The SARS-CoV-2 RNA is generally  detectable in upper and lower respiratory sp ecimens during the acute  phase of infection. The expected result is Negative. Fact Sheet for Patients:  StrictlyIdeas.no Fact Sheet for Healthcare Providers: BankingDealers.co.za This test is not yet approved or cleared by the [place] FDA and has been authorized for detection and/or diagnosis of SARS-CoV-2 by FDA under an Emergency Use Authorization (EUA).  This EUA will remain in effect (meaning this test can be used) for the duration of the COVID-19 declaration under Section 564(b)(1) of the Act, 21 U.S.C. section 360bbb-3(b)(1), unless the authorization is terminated or revoked sooner. Performed at [facility], [place] [address], [place], [place] [address]   Urine Culture     Status: None   Collection Time: 09/09/19  4:52 PM   Specimen: Urine, Catheterized  Result Value Ref Range Status   Specimen Description URINE, CATHETERIZED  Final   Special Requests NONE  Final   Culture   Final    NO GROWTH Performed at [facility], [address], [place], [place] [address]    Report Status 09/10/2019 FINAL  Final  Blood Culture (routine x 2)     Status: None   Collection Time: 09/09/19  5:08 PM   Specimen: BLOOD  Result Value Ref Range Status   Specimen Description BLOOD LEFT ANTECUBITAL  Final  Special Requests   Final    BOTTLES DRAWN AEROBIC AND ANAEROBIC  Blood Culture results may not be optimal due to an excessive volume of blood received in culture bottles   Culture   Final    NO GROWTH 5 DAYS Performed at [facility], [place] [address], [place], [place] [address]    Report Status 09/14/2019 FINAL  Final  Blood Culture (routine x 2)     Status: None   Collection Time: 09/09/19  5:10 PM   Specimen: BLOOD  Result Value Ref Range Status   Specimen Description BLOOD BLOOD RIGHT FOREARM  Final   Special Requests   Final    BOTTLES DRAWN AEROBIC AND ANAEROBIC Blood Culture adequate volume   Culture   Final    NO GROWTH 5 DAYS Performed at [facility], [place] [address] [place], [place] [address]    Report Status 09/14/2019 FINAL  Final  CSF culture     Status: None   Collection Time: 09/10/19 12:02 AM   Specimen: CSF; Cerebrospinal Fluid  Result Value Ref Range Status   Specimen Description CSF  Final   Special Requests NONE  Final   Gram Stain   Final    WBC PRESENT,BOTH PMN AND MONONUCLEAR NO ORGANISMS SEEN CYTOSPIN SMEAR    Culture   Final    NO GROWTH 3 DAYS Performed at [facility], [place] [address] [place], [place] [address]    Report Status 09/13/2019 FINAL  Final  MRSA PCR Screening     Status: None   Collection Time: 09/10/19 12:54 AM  Result Value Ref Range Status   MRSA by PCR NEGATIVE NEGATIVE Final    Comment:        The GeneXpert MRSA Assay (FDA approved for NASAL specimens only), is one component of a comprehensive MRSA colonization surveillance program. It is not intended to diagnose MRSA infection nor to guide or monitor treatment for MRSA infections. Performed at [facility], [place] [address] [place], [place] [address]   Respiratory Panel by PCR     Status: None   Collection Time: 09/11/19 10:45 AM   Specimen: Nasopharyngeal Swab; Respiratory  Result Value Ref Range Status   Adenovirus NOT DETECTED NOT DETECTED Final   Coronavirus 229E NOT DETECTED NOT DETECTED Final     Comment: (NOTE) The Coronavirus on the Respiratory Panel, DOES NOT test for the novel  Coronavirus (2019 nCoV)    Coronavirus HKU1 NOT DETECTED NOT DETECTED Final   Coronavirus NL63 NOT DETECTED NOT DETECTED Final   Coronavirus OC43 NOT DETECTED NOT DETECTED Final   Metapneumovirus NOT DETECTED NOT DETECTED Final   Rhinovirus / Enterovirus NOT DETECTED NOT DETECTED Final   Influenza A NOT DETECTED NOT DETECTED Final   Influenza B NOT DETECTED NOT DETECTED Final   Parainfluenza Virus 1 NOT DETECTED NOT DETECTED Final   Parainfluenza Virus 2 NOT DETECTED NOT DETECTED Final   Parainfluenza Virus 3 NOT DETECTED NOT DETECTED Final   Parainfluenza Virus 4 NOT DETECTED NOT DETECTED Final   Respiratory Syncytial Virus NOT DETECTED NOT DETECTED Final   Bordetella pertussis NOT DETECTED NOT DETECTED Final   Chlamydophila pneumoniae NOT DETECTED NOT DETECTED Final   Mycoplasma pneumoniae NOT DETECTED NOT DETECTED Final    Comment: Performed at [facility], [place], [place], [place] [address]  Culture, Urine     Status: None   Collection Time: 09/15/19  8:46 AM   Specimen: Urine,  Random  Result Value Ref Range Status   Specimen Description URINE, RANDOM  Final   Special Requests NONE  Final   Culture   Final    NO GROWTH Performed at [facility], [address], [place], [place] [address]    Report Status 09/16/2019 FINAL  Final      Radiology Studies: No results found.    LOS: 10 days    Flora Lipps, MD [facility]  09/19/2019, 11:36 AM

## 2019-09-19 NOTE — Progress Notes (Signed)
Physical Therapy Treatment Patient Details Name: Amanda [name] MRN: [id number] DOB: [birth year] Today's Date: 09/19/2019    History of Present Illness Pt is a 68 yo female presenting with AMS. 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 Pt required intubation 9/21-9/25. PMH: DM, HTN, HLD    PT Comments    Pt very limited this session secondary to fatigue and only able to tolerate bed mobility and transfers to [facility] to void. She admitted feeling very weak and having a lot of difficulty with movement. Pt would continue to benefit from skilled physical therapy services at this time while admitted and after d/c to address the below listed limitations in order to improve overall safety and independence with functional mobility.    Follow Up Recommendations  SNF     Equipment Recommendations  Rolling walker with 5" wheels;3in1 (PT)    Recommendations for Other Services       Precautions / Restrictions Precautions Precautions: Fall Restrictions Weight Bearing Restrictions: No    Mobility  Bed Mobility Overal bed mobility: Needs Assistance Bed Mobility: Supine to Sit;Sit to Supine     Supine to sit: Min guard Sit to supine: Min guard   General bed mobility comments: increased time and effort, min guard for safety, pt exiting bed towards her R side  Transfers Overall transfer level: Needs assistance Equipment used: Rolling walker (2 wheeled) Transfers: Sit to/from [facility] Sit to Stand: Min assist Stand pivot transfers: Min assist       General transfer comment: min A for stability and safety with transitional movement; pt very fatigued from minimal activity  Ambulation/Gait             General Gait Details: pt only able to perform transfers secondary to increasing fatigue with minimal activity   Stairs             Wheelchair Mobility     Modified Rankin (Stroke Patients Only)       Balance Overall balance assessment: Needs assistance Sitting-balance support: No upper extremity supported;Feet supported Sitting balance-Leahy Scale: Fair     Standing balance support: Bilateral upper extremity supported Standing balance-Leahy Scale: Poor                              Cognition Arousal/Alertness: Awake/alert Behavior During Therapy: Flat affect Overall Cognitive Status: Impaired/Different from baseline Area of Impairment: Problem solving                             Problem Solving: Slow processing        Exercises      General Comments        Pertinent Vitals/Pain Pain Assessment: No/denies pain    Home Living                      Prior Function            PT Goals (current goals can now be found in the care plan section) Acute Rehab PT Goals PT Goal Formulation: With patient Time For Goal Achievement: 09/29/19 Potential to Achieve Goals: Good Progress towards PT goals: Progressing toward goals    Frequency    Min 2X/week      PT Plan Frequency needs to be updated    Co-evaluation  AM-PAC PT "6 Clicks" Mobility   Outcome Measure  Help needed turning from your back to your side while in a flat bed without using bedrails?: None Help needed moving from lying on your back to sitting on the side of a flat bed without using bedrails?: None Help needed moving to and from a bed to a chair (including a wheelchair)?: A Little Help needed standing up from a chair using your arms (e.g., wheelchair or bedside chair)?: A Little Help needed to walk in hospital room?: A Lot Help needed climbing 3-5 steps with a railing? : Total 6 Click Score: 17    End of Session Equipment Utilized During Treatment: Gait belt Activity Tolerance: Patient limited by fatigue Patient left: in bed;with call bell/phone within reach;with bed alarm set;with SCD's  reapplied Nurse Communication: Mobility status PT Visit Diagnosis: Unsteadiness on feet (R26.81);Other abnormalities of gait and mobility (R26.89)     Time: 0300-9233 PT Time Calculation (min) (ACUTE ONLY): 16 min  Charges:  $Therapeutic Activity: 8-22 mins                     Sherie Don, PT, DPT  Acute Rehabilitation Services Pager [phone] Office [place] 09/19/2019, 11:00 AM

## 2019-09-19 NOTE — Progress Notes (Signed)
Nutrition Follow-up   RD working remotely.  DOCUMENTATION CODES:   Not applicable  INTERVENTION:  Continue Ensure Enlive po BID (thickened to appropriate consistency), each supplement provides 350 kcal and 20 grams of protein.  Continue Magic cup TID with meals, each supplement provides 290 kcal and 9 grams of protein   Encourage adequate PO intake.   Diet education handout regarding diabetes given.   NUTRITION DIAGNOSIS:   Inadequate oral intake related to acute illness as evidenced by NPO status; diet advanced; improving  GOAL:   Patient will meet greater than or equal to 90% of their needs; progressing  MONITOR:   Vent status, TF tolerance, Labs, Weight trends, Skin  REASON FOR ASSESSMENT:   Consult Assessment of nutrition requirement/status, Enteral/tube feeding initiation and management  ASSESSMENT:   68 yo female AMS with suspected PRES, HTN emergency, acute respiratory failure requiring intubation, AKI with rhabdomyolysis.PMH includes HTN, DM, normocytic anemia, dyslipidemia, MDD   9/21 Admit, Intubated, CT abdomen with cholelithiasis, possible mild colitis 9/25 Extubated  Pt continues on a dysphagia 2 diet with nectar thick liquids. Meal completion has been 25-75% with most intake at least 50%. Pt currently has Ensure ordered and has been consuming them. RD to continue with current orders to aid in caloric and protein needs. RD consulted for diet education regarding diabetes. HbA1c 10.9%. Diet education handout "Counting Carbohydrates for People with Diabetes" from the Academy of Nutrition and Dietetics Manual placed in pt discharge instructions. RD to follow up for diet education.   Labs and medications reviewed.   Diet Order:   Diet Order            DIET - DYS 1 Room service appropriate? No; Fluid consistency: Nectar Thick  Diet effective now              EDUCATION NEEDS:   Education needs have been addressed  Skin:  Skin Assessment: Reviewed RN  Assessment  Last BM:  9/21  Height:   Ht Readings from Last 1 Encounters:  09/17/19 5\' 4"  (1.626 m)    Weight:   Wt Readings from Last 1 Encounters:  09/18/19 58.6 kg    Ideal Body Weight:  54.5 kg  BMI:  Body mass index is 22.18 kg/m.  Estimated Nutritional Needs:   Kcal:  1700-1900  Protein:  85-95 grams  Fluid:  >/= 1.7 L/day    Corrin Parker, MS, RD, LDN Pager # [phone] After hours/ weekend pager # [phone]

## 2019-09-19 NOTE — Progress Notes (Signed)
Speech Language Pathology Treatment: Dysphagia  Patient Details Name: Amanda [name] MRN: [id number] DOB: 10/13/1951 Today's Date: 09/19/2019 Time: 1344-1400 SLP Time Calculation (min) (ACUTE ONLY): 16 min  Assessment / Plan / Recommendation Clinical Impression  Pt demonstrated definite improvements today with clinical presentation of swallowing. Trials of upgraded solid textures were tolerated well with effective mastication.  Trials of thin liquids - single sips, successive sips, and then consumed in conjunction with sandwich - did not elicit coughing as it has in previous trials.  Pt appears to be protecting airway with advanced diet and thin liquids.  Recommend allowing a regular diet, thin liquids.  Pt very enthusiastic about improvements.  SLP will continue towards POC.   HPI HPI: Pt is a 68 yo female presenting with AMS. 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 Pt required intubation 9/21-9/25. PMH: DM, HTN, HLD      SLP Plan  Continue with current plan of care       Recommendations  Diet recommendations: Regular;Thin liquid Liquids provided via: Cup;Straw Medication Administration: Whole meds with puree Supervision: Patient able to self feed Compensations: Slow rate;Small sips/bites Postural Changes and/or Swallow Maneuvers: Seated upright 90 degrees                Oral Care Recommendations: Oral care BID SLP Visit Diagnosis: Dysphagia, oropharyngeal phase (R13.12) Plan: Continue with current plan of care       GO             Amanda [name] L. Samson Frederic, MA CCC/SLP Acute Rehabilitation Services Office number [phone] Pager [phone]    [name] 09/19/2019, 3:42 PM

## 2019-09-19 NOTE — NC FL2 (Signed)
[place] LEVEL OF CARE SCREENING TOOL     IDENTIFICATION  Patient Name: Amanda [name] Birthdate: [birth year] Sex: female Admission Date (Current Location): 09/09/2019  [facility] and [place] Number:  Herbalist and Address:  [facility] [place], [place] [address], [place], [place] [address]      Provider Number: [id number]  Attending Physician Name and Address:  Flora Lipps, MD  Relative Name and Phone Number:  Taleeyah Bora; spouse, [phone]    Current Level of Care: Hospital Recommended Level of Care: [place] Prior Approval Number:    Date Approved/Denied:   PASRR Number: [id number]  Discharge Plan: SNF    Current Diagnoses: Patient Active Problem List   Diagnosis Date Noted  . Acute metabolic encephalopathy 70/34/0352  . Acute on chronic renal failure ([place])   . Respiratory failure ([place])   . Hypertensive emergency   . Diabetic ketoacidosis with coma associated with type 2 diabetes mellitus ([place])   . Acute encephalopathy 09/09/2019  . Flu-like symptoms 02/06/2019  . Gait abnormality 01/10/2019  . Near syncope 11/26/2018  . Nail fungus 10/23/2017  . Cervical radiculopathy due to degenerative joint disease of spine 05/03/2017  . 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 Pain of both shoulder joints 11/14/2016  . Orthostatic hypotension 09/15/2016  . Vitreous hemorrhage of left eye ([place]) 05/13/2016  . Osteopenia 05/04/2016  . Posterior capsular opacification of left eye, obscuring vision 02/15/2016  . MDD (major depressive disorder), recurrent episode, moderate ([place]) 02/08/2016  . Malaise 11/24/2015  . Pseudophakia of both eyes 07/27/2015  . Constipation 09/16/2014  . Healthcare maintenance 06/03/2013  . Diabetic neuropathy ([place]) 05/20/2013  . Type 2 diabetes mellitus with both eyes affected by proliferative retinopathy and macular edema, with long-term current use of insulin ([place]) 04/19/2013  .  Seasonal allergies 03/12/2013  . Dyslipidemia 02/15/2013  . DM II (diabetes mellitus, type II), controlled ([place]) 02/14/2013  . Need for immunization against influenza 02/14/2013  . Normocytic anemia 02/14/2013  . Essential hypertension 05/10/2010    Orientation RESPIRATION BLADDER Height & Weight     Self, Time, Situation, Place  Normal Continent, External catheter Weight: 129 lb 3 oz (58.6 kg) Height:  _0  (162.6 cm)  BEHAVIORAL SYMPTOMS/MOOD NEUROLOGICAL BOWEL NUTRITION STATUS      Continent Diet(see discharge summary)  AMBULATORY STATUS COMMUNICATION OF NEEDS Skin   Extensive Assist Verbally Other (Comment)(generalized ecchymosis)                       Personal Care Assistance Level of Assistance  Bathing, Feeding, Dressing Bathing Assistance: Limited assistance Feeding assistance: Independent Dressing Assistance: Limited assistance     Functional Limitations Info  Speech, Hearing, Sight Sight Info: Adequate Hearing Info: Adequate Speech Info: Adequate    SPECIAL CARE FACTORS FREQUENCY  OT (By licensed OT), PT (By licensed PT)     PT Frequency: 5x week OT Frequency: 5x week            Contractures Contractures Info: Not present    Additional Factors Info  Code Status, Allergies, Insulin Sliding Scale Code Status Info: Full Code Allergies Info: Gabapentin   Insulin Sliding Scale Info: insulin aspart (novoLOG) injection 0-15 Units every 4 hrs; insulin detemir (LEVEMIR) injection 5 Units daily       Current Medications (09/19/2019):  This is the current hospital active medication list Current Facility-Administered Medications  Medication Dose Route Frequency Provider Last Rate Last Dose  . 0.9 %  sodium chloride infusion   Intravenous PRN Scatliffe, Rise Paganini, MD 10 mL/hr at 09/13/19 2200    . acetaminophen (TYLENOL) tablet 650 mg  650 mg Oral Q6H PRN Lovey Newcomer T, NP   650 mg at 09/19/19 0503  . amLODipine (NORVASC) tablet 5 mg  5 mg Oral Daily  Kinnie Feil, MD   5 mg at 09/19/19 0849  . bisacodyl (DULCOLAX) EC tablet 5 mg  5 mg Oral Daily PRN Buriev, Arie Sabina, MD      . carvedilol (COREG) tablet 3.125 mg  3.125 mg Oral BID WC Pokhrel, Laxman, MD      . chlorhexidine gluconate (MEDLINE KIT) (PERIDEX) 0.12 % solution 15 mL  15 mL Mouth Rinse BID Jennelle Human B, NP   15 mL at 09/19/19 0855  . docusate (COLACE) 50 MG/5ML liquid 100 mg  100 mg Oral Daily Kc, Ramesh, MD   100 mg at 09/19/19 0849  . feeding supplement (ENSURE ENLIVE) (ENSURE ENLIVE) liquid 237 mL  237 mL Oral BID BM Buriev, Arie Sabina, MD   237 mL at 09/19/19 1315  . heparin injection 5,000 Units  5,000 Units Subcutaneous Q8H Jennelle Human B, NP   5,000 Units at 09/19/19 1315  . hydrALAZINE (APRESOLINE) injection 10 mg  10 mg Intravenous Q6H PRN Tat, David, MD      . insulin aspart (novoLOG) injection 0-15 Units  0-15 Units Subcutaneous Q4H Minor, Grace Bushy, NP   3 Units at 09/19/19 1215  . insulin detemir (LEVEMIR) injection 5 Units  5 Units Subcutaneous Daily Tat, David, MD   5 Units at 09/19/19 0850  . labetalol (NORMODYNE) injection 10 mg  10 mg Intravenous Q2H PRN Deterding, Guadelupe Sabin, MD   10 mg at 09/14/19 2246  . ondansetron (ZOFRAN) injection 4 mg  4 mg Intravenous Q6H PRN Minor, Grace Bushy, NP      . pantoprazole sodium (PROTONIX) 40 mg/20 mL oral suspension 40 mg  40 mg Oral Daily Karren Cobble, RPH   40 mg at 09/19/19 2202     Discharge Medications: Please see discharge summary for a list of discharge medications.  Relevant Imaging Results:  Relevant Lab Results:   Additional Information [id number] [place], [place]

## 2019-09-19 NOTE — Discharge Instructions (Signed)
Local Endocrin[facility] [phone]) 1. Dr. Philemon Kingdom 2. Dr. Janie [facility] [phone]) 1. Dr. Delrae Rend [facility] [phone]) 1. Dr. Jacelyn Pi 2. Dr. Anda Kraft [facility] [phone] 1. Dr. Daneil Dolin [facility] [phone]) [[place] office]  [phone]) [[place] office] 1. Dr. Lenna Sciara Solum 2. Dr. Judithann Sheen [facility]) [phone]) 1. Autumn Hudnall Ronnald Ramp), PA 2. Dr. Amalia Greenhouse 3. Dr. Marsh Dolly. [facility] [phone]) 1. Dr. Glade Lloyd [facility] [phone]) 1. Dr. Orville Govern 2. Dr. Lelon Huh 3. Dr. Jerelene Redden 4. Alwyn Ren, FNP Dr. Carolynn Serve. [facility] [place] [phone])  Insulin Injection Instructions, Using Insulin Pens, Adult A subcutaneous injection is a shot of medicine that is injected into the layer of fat and tissue between skin and muscle. People with type 1 diabetes must take insulin because their bodies do not make it. People with type 2 diabetes may need to take insulin. There are many different types of insulin. The type of insulin that you take may determine how many injections you give yourself and when you need to give the injections. Supplies needed:  Soap and water to wash hands.  Your insulin pen.  A new, unused needle.  Alcohol wipes.  A disposal container that is meant for sharp items (sharps container), such as an empty plastic bottle with a cover. How to choose a site for injection The body absorbs insulin differently, depending on where the insulin is injected (injection site). It is best to inject insulin into the same body area each time (for example, always in the abdomen), but you should use a different spot in that area for each injection. Do not inject the insulin in the same  spot each time. There are five main areas that can be used for injecting. These areas include:  Abdomen. This is the preferred area.  Front of thigh.  Upper, outer side of thigh.  Upper, outer side of arm.  Upper, outer part of buttock. How to use an insulin pen  First, follow the steps for Get ready, then continue with the steps for Inject the insulin. Get ready 1. Wash your hands with soap and water. If soap and water are not available, use hand sanitizer. 2. Before you give yourself an insulin injection, be sure to test your blood sugar level (blood glucose level) and write down that number. Follow any instructions from your health care provider about what to do if your blood glucose level is higher or lower than your normal range. 3. Check the expiration date and the type of insulin that is in the pen. 4. If you are using CLEAR insulin, check to see that it is clear and free of clumps. 5. If you are using CLOUDY insulin, do not shake the pen to get the injection ready. Instead, get it ready in one of these ways: ? Gently roll the pen between your palms several times. ? Tip the pen up and down several times. 6. Remove the cap from the insulin pen. 7. Use an alcohol wipe to clean the rubber tip of the pen. 8. Remove the protective paper tab from the disposable needle. Do not let the needle touch anything. 9. Screw a new, unused needle onto the pen. 10. Remove the outer plastic needle cover. Do not throw away the outer plastic cover yet. ? If the pen uses a special safety needle, leave the inner needle  shield in place. ? If the pen does not use a special safety needle, remove the inner plastic cover from the needle. 11. Follow the manufacturer's instructions to prime the insulin pen with the volume of insulin needed. Hold the pen with the needle pointing up, and push the button on the opposite end of the pen until a drop of insulin appears at the needle tip. If no insulin appears,  repeat this step. 12. Turn the button (dial) to the number of units of insulin that you will be injecting. Inject the insulin 1. Use an alcohol wipe to clean the site where you will be injecting the needle. Let the site air-dry. 2. Hold the pen in the palm of your writing hand like a pencil. 3. If directed by your health care provider, use your other hand to pinch and hold about an inch (2.5 cm) of skin at the injection site. Do not directly touch the cleaned part of the skin. 4. Gently but quickly, use your writing hand to put the needle straight into the skin. The needle should be at a 90-degree angle (perpendicular) to the skin. 5. When the needle is completely inserted into the skin, use your thumb or index finger of your writing hand to push the top button of the pen down all the way to inject the insulin. 6. Let go of the skin that you are pinching. Continue to hold the pen in place with your writing hand. 7. Wait 10 seconds, then pull the needle straight out of the skin. This will allow all of the insulin to go from the pen and needle into your body. 8. Carefully put the larger (outer) plastic cover of the needle back over the needle, then unscrew the capped needle and discard it in a sharps container, such as an empty plastic bottle with a cover. 9. Put the plastic cap back on the insulin pen. How to throw away supplies  Discard all used needles in a puncture-proof sharps disposal container. You can ask your local pharmacy about where you can get this kind of disposal container, or you can use an empty plastic liquid laundry detergent bottle that has a cover.  Follow the disposal regulations for the area where you live. Do not use any needle more than one time.  Throw away empty disposable pens in the regular trash. Questions to ask your health care provider  How often should I be taking insulin?  How often should I check my blood glucose?  What amount of insulin should I be taking  at each time?  What are the side effects?  What should I do if my blood glucose is too high?  What should I do if my blood glucose is too low?  What should I do if I forget to take my insulin?  What number should I call if I have questions? Where to find more information  American Diabetes Association (ADA): www.diabetes.org  American Association of Diabetes Educators (AADE) Patient Resources: https://www.diabeteseducator.org Summary  A subcutaneous injection is a shot of medicine that is injected into the layer of fat and tissue between skin and muscle.  Before you give yourself an insulin injection, be sure to test your blood sugar level (blood glucose level) and write down that number.  Check the expiration date and the type of insulin that is in the pen. The type of insulin that you take may determine how many injections you give yourself and when you need to give the injections.  It is best to inject insulin into the same body area each time (for example, always in the abdomen), but you should use a different spot in that area for each injection. This information is not intended to replace advice given to you by your health care provider. Make sure you discuss any questions you have with your health care provider. Document Released: 01/08/2016 Document Revised: 12/25/2017 Document Reviewed: 01/08/2016 Elsevier Patient Education  [place]. Hemoglobin A1c Test Why am I having this test? You may have the hemoglobin A1c test (HbA1c test) done to:  Evaluate your risk for developing diabetes (diabetes mellitus).  Diagnose diabetes.  Monitor long-term control of blood sugar (glucose) in people who have diabetes and help make treatment decisions. This test may be done with other blood glucose tests, such as fasting blood glucose and oral glucose tolerance tests. What is being tested? Hemoglobin is a type of protein in the blood that carries oxygen. Glucose attaches to  hemoglobin to form glycated hemoglobin. This test checks the amount of glycated hemoglobin in your blood, which is a good indicator of the average amount of glucose in your blood during the past 2-3 months. What kind of sample is taken?  A blood sample is required for this test. It is usually collected by inserting a needle into a blood vessel. Tell a health care provider about:  All medicines you are taking, including vitamins, herbs, eye drops, creams, and over-the-counter medicines.  Any blood disorders you have.  Any surgeries you have had.  Any medical conditions you have.  Whether you are pregnant or may be pregnant. How are the results reported? Your results will be reported as a percentage that indicates how much of your hemoglobin has glucose attached to it (is glycated). Your health care provider will compare your results to normal ranges that were established after testing a large group of people (reference ranges). Reference ranges may vary among labs and hospitals. For this test, common reference ranges are:  Adult or child without diabetes: 4-5.6%.  Adult or child with diabetes and good blood glucose control: less than 7%. What do the results mean? If you have diabetes:  A result of less than 7% is considered normal, meaning that your blood glucose is well controlled.  A result higher than 7% means that your blood glucose is not well controlled, and your treatment plan may need to be adjusted. If you do not have diabetes:  A result within the reference range is considered normal, meaning that you are not at high risk for diabetes.  A result of 5.7-6.4% means that you have a high risk of developing diabetes, and you may have prediabetes. Prediabetes is the condition of having a blood glucose level that is higher than it should be, but not high enough for you to be diagnosed with diabetes. Having prediabetes puts you at risk for developing type 2 diabetes (type 2 diabetes  mellitus). You may have more tests, including a repeat HbA1c test.  Results of 6.5% or higher on two separate HbA1c tests mean that you have diabetes. You may have more tests to confirm the diagnosis. Abnormally low HbA1c values may be caused by:  Pregnancy.  Severe blood loss.  Receiving donated blood (transfusions).  Low red blood cell count (anemia).  Long-term kidney failure.  Some unusual forms (variants) of hemoglobin. Talk with your health care provider about what your results mean. Questions to ask your health care provider Ask your health care provider, or  the department that is doing the test:  When will my results be ready?  How will I get my results?  What are my treatment options?  What other tests do I need?  What are my next steps? Summary  The hemoglobin A1c test (HbA1c test) may be done to evaluate your risk for developing diabetes, to diagnose diabetes, and to monitor long-term control of blood sugar (glucose) in people who have diabetes and help make treatment decisions.  Hemoglobin is a type of protein in the blood that carries oxygen. Glucose attaches to hemoglobin to form glycated hemoglobin. This test checks the amount of glycated hemoglobin in your blood, which is a good indicator of the average amount of glucose in your blood during the past 2-3 months.  Talk with your health care provider about what your results mean. This information is not intended to replace advice given to you by your health care provider. Make sure you discuss any questions you have with your health care provider. Document Released: 12/27/2004 Document Revised: 11/17/2017 Document Reviewed: 07/18/2017 Elsevier Patient Education  2020 [place]. Blood Glucose Monitoring, Adult Monitoring your blood sugar (glucose) is an important part of managing your diabetes (diabetes mellitus). Blood glucose monitoring involves checking your blood glucose as often as directed and keeping a  record (log) of your results over time. Checking your blood glucose regularly and keeping a blood glucose log can:  Help you and your health care provider adjust your diabetes management plan as needed, including your medicines or insulin.  Help you understand how food, exercise, illnesses, and medicines affect your blood glucose.  Let you know what your blood glucose is at any time. You can quickly find out if you have low blood glucose (hypoglycemia) or high blood glucose (hyperglycemia). Your health care provider will set individualized treatment goals for you. Your goals will be based on your age, other medical conditions you have, and how you respond to diabetes treatment. Generally, the goal of treatment is to maintain the following blood glucose levels:  Before meals (preprandial): 80-130 mg/dL (4.4-7.2 mmol/L).  After meals (postprandial): below 180 mg/dL (10 mmol/L).  A1c level: less than 7%. Supplies needed:  Blood glucose meter.  Test strips for your meter. Each meter has its own strips. You must use the strips that came with your meter.  A needle to prick your finger (lancet). Do not use a lancet more than one time.  A device that holds the lancet (lancing device).  A journal or log book to write down your results. How to check your blood glucose  10. Wash your hands with soap and water. 11. Prick the side of your finger (not the tip) with the lancet. Use a different finger each time. 12. Gently rub the finger until a small drop of blood appears. 13. Follow instructions that come with your meter for inserting the test strip, applying blood to the strip, and using your blood glucose meter. 76. Write down your result and any notes. Some meters allow you to use areas of your body other than your finger (alternative sites) to test your blood. The most common alternative sites are:  Forearm.  Thigh.  Palm of the hand. If you think you may have hypoglycemia, or if you  have a history of not knowing when your blood glucose is getting low (hypoglycemia unawareness), do not use alternative sites. Use your finger instead. Alternative sites may not be as accurate as the fingers, because blood flow is slower  in these areas. This means that the result you get may be delayed, and it may be different from the result that you would get from your finger. Follow these instructions at home: Blood glucose log   Every time you check your blood glucose, write down your result. Also write down any notes about things that may be affecting your blood glucose, such as your diet and exercise for the day. This information can help you and your health care provider: ? Look for patterns in your blood glucose over time. ? Adjust your diabetes management plan as needed.  Check if your meter allows you to download your records to a computer. Most glucose meters store a record of glucose readings in the meter. If you have type 1 diabetes:  Check your blood glucose 2 or more times a day.  Also check your blood glucose: ? Before every insulin injection. ? Before and after exercise. ? Before meals. ? 2 hours after a meal. ? Occasionally between 2:00 a.m. and 3:00 a.m., as directed. ? Before potentially dangerous tasks, like driving or using heavy machinery. ? At bedtime.  You may need to check your blood glucose more often, up to 6-10 times a day, if you: ? Use an insulin pump. ? Need multiple daily injections (MDI). ? Have diabetes that is not well-controlled. ? Are ill. ? Have a history of severe hypoglycemia. ? Have hypoglycemia unawareness. If you have type 2 diabetes:  If you take insulin or other diabetes medicines, check your blood glucose 2 or more times a day.  If you are on intensive insulin therapy, check your blood glucose 4 or more times a day. Occasionally, you may also need to check between 2:00 a.m. and 3:00 a.m., as directed.  Also check your blood  glucose: ? Before and after exercise. ? Before potentially dangerous tasks, like driving or using heavy machinery.  You may need to check your blood glucose more often if: ? Your medicine is being adjusted. ? Your diabetes is not well-controlled. ? You are ill. General tips  Always keep your supplies with you.  If you have questions or need help, all blood glucose meters have a 24-hour "hotline" phone number that you can call. You may also contact your health care provider.  After you use a few boxes of test strips, adjust (calibrate) your blood glucose meter by following instructions that came with your meter. Contact a health care provider if:  Your blood glucose is at or above 240 mg/dL (13.3 mmol/L) for 2 days in a row.  You have been sick or have had a fever for 2 days or longer, and you are not getting better.  You have any of the following problems for more than 6 hours: ? You cannot eat or drink. ? You have nausea or vomiting. ? You have diarrhea. Get help right away if:  Your blood glucose is lower than 54 mg/dL (3 mmol/L).  You become confused or you have trouble thinking clearly.  You have difficulty breathing.  You have moderate or large ketone levels in your urine. Summary  Monitoring your blood sugar (glucose) is an important part of managing your diabetes (diabetes mellitus).  Blood glucose monitoring involves checking your blood glucose as often as directed and keeping a record (log) of your results over time.  Your health care provider will set individualized treatment goals for you. Your goals will be based on your age, other medical conditions you have, and how you respond  to diabetes treatment.  Every time you check your blood glucose, write down your result. Also write down any notes about things that may be affecting your blood glucose, such as your diet and exercise for the day. This information is not intended to replace advice given to you by your  health care provider. Make sure you discuss any questions you have with your health care provider. Document Released: 12/08/2003 Document Revised: 09/28/2018 Document Reviewed: 05/16/2016 Elsevier Patient Education  2020 [place]. Hypoglycemia Hypoglycemia occurs when the level of sugar (glucose) in the blood is too low. Hypoglycemia can happen in people who do or do not have diabetes. It can develop quickly, and it can be a medical emergency. For most people with diabetes, a blood glucose level below 70 mg/dL (3.9 mmol/L) is considered hypoglycemia. Glucose is a type of sugar that provides the body's main source of energy. Certain hormones (insulin and glucagon) control the level of glucose in the blood. Insulin lowers blood glucose, and glucagon raises blood glucose. Hypoglycemia can result from having too much insulin in the bloodstream, or from not eating enough food that contains glucose. You may also have reactive hypoglycemia, which happens within 4 hours after eating a meal. What are the causes? Hypoglycemia occurs most often in people who have diabetes and may be caused by:  Diabetes medicine.  Not eating enough, or not eating often enough.  Increased physical activity.  Drinking alcohol on an empty stomach. If you do not have diabetes, hypoglycemia may be caused by:  A tumor in the pancreas.  Not eating enough, or not eating for long periods at a time (fasting).  A severe infection or illness.  Certain medicines. What increases the risk? Hypoglycemia is more likely to develop in:  People who have diabetes and take medicines to lower blood glucose.  People who abuse alcohol.  People who have a severe illness. What are the signs or symptoms? Mild symptoms Mild hypoglycemia may not cause any symptoms. If you do have symptoms, they may include:  Hunger.  Anxiety.  Sweating and feeling clammy.  Dizziness or feeling  light-headed.  Sleepiness.  Nausea.  Increased heart rate.  Headache.  Blurry vision.  Irritability.  Tingling or numbness around the mouth, lips, or tongue.  A change in coordination.  Restless sleep. Moderate symptoms Moderate hypoglycemia can cause:  Mental confusion and poor judgment.  Behavior changes.  Weakness.  Irregular heartbeat. Severe symptoms Severe hypoglycemia is a medical emergency. It can cause:  Fainting.  Seizures.  Loss of consciousness (coma).  Death. How is this diagnosed? Hypoglycemia is diagnosed with a blood test to measure your blood glucose level. This blood test is done while you are having symptoms. Your health care provider may also do a physical exam and review your medical history. How is this treated? This condition can often be treated by immediately eating or drinking something that contains sugar, such as:  Fruit juice, 4-6 oz (120-150 mL).  Regular soda (not diet soda), 4-6 oz (120-150 mL).  Low-fat milk, 4 oz (120 mL).  Several pieces of hard candy.  Sugar or honey, 1 Tbsp (15 mL). Treating hypoglycemia if you have diabetes If you are alert and able to swallow safely, follow the 15:15 rule:  Take 15 grams of a rapid-acting carbohydrate. Talk with your health care provider about how much you should take.  Rapid-acting options include: ? Glucose pills (take 15 grams). ? 6-8 pieces of hard candy. ? 4-6 oz (120-150 mL)  of fruit juice. ? 4-6 oz (120-150 mL) of regular (not diet) soda. ? 1 Tbsp (15 mL) honey or sugar.  Check your blood glucose 15 minutes after you take the carbohydrate.  If the repeat blood glucose level is still at or below 70 mg/dL (3.9 mmol/L), take 15 grams of a carbohydrate again.  If your blood glucose level does not increase above 70 mg/dL (3.9 mmol/L) after 3 tries, seek emergency medical care.  After your blood glucose level returns to normal, eat a meal or a snack within 1  hour.  Treating severe hypoglycemia Severe hypoglycemia is when your blood glucose level is at or below 54 mg/dL (3 mmol/L). Severe hypoglycemia is a medical emergency. Get medical help right away. If you have severe hypoglycemia and you cannot eat or drink, you may need an injection of glucagon. A family member or close friend should learn how to check your blood glucose and how to give you a glucagon injection. Ask your health care provider if you need to have an emergency glucagon injection kit available. Severe hypoglycemia may need to be treated in a hospital. The treatment may include getting glucose through an IV. You may also need treatment for the cause of your hypoglycemia. Follow these instructions at home:  General instructions  Take over-the-counter and prescription medicines only as told by your health care provider.  Monitor your blood glucose as told by your health care provider.  Limit alcohol intake to no more than 1 drink a day for nonpregnant women and 2 drinks a day for men. One drink equals 12 oz of beer (355 mL), 5 oz of wine (148 mL), or 1 oz of hard liquor (44 mL).  Keep all follow-up visits as told by your health care provider. This is important. If you have diabetes:  Always have a rapid-acting carbohydrate snack with you to treat low blood glucose.  Follow your diabetes management plan as directed. Make sure you: ? Know the symptoms of hypoglycemia. It is important to treat it right away to prevent it from becoming severe. ? Take your medicines as directed. ? Follow your exercise plan. ? Follow your meal plan. Eat on time, and do not skip meals. ? Check your blood glucose as often as directed. Always check before and after exercise. ? Follow your sick day plan whenever you cannot eat or drink normally. Make this plan in advance with your health care provider.  Share your diabetes management plan with people in your workplace, school, and household.  Check  your urine for ketones when you are ill and as told by your health care provider.  Carry a medical alert card or wear medical alert jewelry. Contact a health care provider if:  You have problems keeping your blood glucose in your target range.  You have frequent episodes of hypoglycemia. Get help right away if:  You continue to have hypoglycemia symptoms after eating or drinking something containing glucose.  Your blood glucose is at or below 54 mg/dL (3 mmol/L).  You have a seizure.  You faint. These symptoms may represent a serious problem that is an emergency. Do not wait to see if the symptoms will go away. Get medical help right away. Call your local emergency services (911 in the [place] Summary  Hypoglycemia occurs when the level of sugar (glucose) in the blood is too low.  Hypoglycemia can happen in people who do or do not have diabetes. It can develop quickly, and it can be a  medical emergency.  Make sure you know the symptoms of hypoglycemia and how to treat it.  Always have a rapid-acting carbohydrate snack with you to treat low blood sugar. This information is not intended to replace advice given to you by your health care provider. Make sure you discuss any questions you have with your health care provider. Document Released: 12/05/2005 Document Revised: 05/28/2018 Document Reviewed: 01/08/2016 Elsevier Patient Education  [place]. Hyperglycemia Hyperglycemia occurs when the level of sugar (glucose) in the blood is too high. Glucose is a type of sugar that provides the body's main source of energy. Certain hormones (insulin and glucagon) control the level of glucose in the blood. Insulin lowers blood glucose, and glucagon increases blood glucose. Hyperglycemia can result from having too little insulin in the bloodstream, or from the body not responding normally to insulin. Hyperglycemia occurs most often in people who have diabetes (diabetes mellitus), but it can  happen in people who do not have diabetes. It can develop quickly, and it can be life-threatening if it causes you to become severely dehydrated (diabetic ketoacidosis or hyperglycemic hyperosmolar state). Severe hyperglycemia is a medical emergency. What are the causes? If you have diabetes, hyperglycemia may be caused by:  Diabetes medicine.  Medicines that increase blood glucose or affect your diabetes control.  Not eating enough, or not eating often enough.  Changes in physical activity level.  Being sick or having an infection. If you have prediabetes or undiagnosed diabetes:  Hyperglycemia may be caused by those conditions. If you do not have diabetes, hyperglycemia may be caused by:  Certain medicines, including steroid medicines, beta-blockers, epinephrine, and thiazide diuretics.  Stress.  Serious illness.  Surgery.  Diseases of the pancreas.  Infection. What increases the risk? Hyperglycemia is more likely to develop in people who have risk factors for diabetes, such as:  Having a family member with diabetes.  Having a gene for type 1 diabetes that is passed from parent to child (inherited).  Living in an area with cold weather conditions.  Exposure to certain viruses.  Certain conditions in which the body's disease-fighting (immune) system attacks itself (autoimmune disorders).  Being overweight or obese.  Having an inactive (sedentary) lifestyle.  Having been diagnosed with insulin resistance.  Having a history of prediabetes, gestational diabetes, or polycystic ovarian syndrome (PCOS).  Being of American-Indian, African-American, Hispanic/Latino, or Asian/Pacific Islander descent. What are the signs or symptoms? Hyperglycemia may not cause any symptoms. If you do have symptoms, they may include early warning signs, such as:  Increased thirst.  Hunger.  Feeling very tired.  Needing to urinate more often than usual.  Blurry vision. Other  symptoms may develop if hyperglycemia gets worse, such as:  Dry mouth.  Loss of appetite.  Fruity-smelling breath.  Weakness.  Unexpected or rapid weight gain or weight loss.  Tingling or numbness in the hands or feet.  Headache.  Skin that does not quickly return to normal after being lightly pinched and released (poor skin turgor).  Abdominal pain.  Cuts or bruises that are slow to heal. How is this diagnosed? Hyperglycemia is diagnosed with a blood test to measure your blood glucose level. This blood test is usually done while you are having symptoms. Your health care provider may also do a physical exam and review your medical history. You may have more tests to determine the cause of your hyperglycemia, such as:  A fasting blood glucose (FBG) test. You will not be allowed to eat (you  will fast) for at least 8 hours before a blood sample is taken.  An A1c (hemoglobin A1c) blood test. This provides information about blood glucose control over the previous 2-3 months.  An oral glucose tolerance test (OGTT). This measures your blood glucose at two times: ? After fasting. This is your baseline blood glucose level. ? Two hours after drinking a beverage that contains glucose. How is this treated? Treatment depends on the cause of your hyperglycemia. Treatment may include:  Taking medicine to regulate your blood glucose levels. If you take insulin or other diabetes medicines, your medicine or dosage may be adjusted.  Lifestyle changes, such as exercising more, eating healthier foods, or losing weight.  Treating an illness or infection, if this caused your hyperglycemia.  Checking your blood glucose more often.  Stopping or reducing steroid medicines, if these caused your hyperglycemia. If your hyperglycemia becomes severe and it results in hyperglycemic hyperosmolar state, you must be hospitalized and given IV fluids. Follow these instructions at home:  General  instructions  Take over-the-counter and prescription medicines only as told by your health care provider.  Do not use any products that contain nicotine or tobacco, such as cigarettes and e-cigarettes. If you need help quitting, ask your health care provider.  Limit alcohol intake to no more than 1 drink per day for nonpregnant women and 2 drinks per day for men. One drink equals 12 oz of beer, 5 oz of wine, or 1 oz of hard liquor.  Learn to manage stress. If you need help with this, ask your health care provider.  Keep all follow-up visits as told by your health care provider. This is important. Eating and drinking   Maintain a healthy weight.  Exercise regularly, as directed by your health care provider.  Stay hydrated, especially when you exercise, get sick, or spend time in hot temperatures.  Eat healthy foods, such as: ? Lean proteins. ? Complex carbohydrates. ? Fresh fruits and vegetables. ? Low-fat dairy products. ? Healthy fats.  Drink enough fluid to keep your urine clear or pale yellow. If you have diabetes:  Make sure you know the symptoms of hyperglycemia.  Follow your diabetes management plan, as told by your health care provider. Make sure you: ? Take your insulin and medicines as directed. ? Follow your exercise plan. ? Follow your meal plan. Eat on time, and do not skip meals. ? Check your blood glucose as often as directed. Make sure to check your blood glucose before and after exercise. If you exercise longer or in a different way than usual, check your blood glucose more often. ? Follow your sick day plan whenever you cannot eat or drink normally. Make this plan in advance with your health care provider.  Share your diabetes management plan with people in your workplace, school, and household.  Check your urine for ketones when you are ill and as told by your health care provider.  Carry a medical alert card or wear medical alert jewelry. Contact a  health care provider if:  Your blood glucose is at or above 240 mg/dL (13.3 mmol/L) for 2 days in a row.  You have problems keeping your blood glucose in your target range.  You have frequent episodes of hyperglycemia. Get help right away if:  You have difficulty breathing.  You have a change in how you think, feel, or act (mental status).  You have nausea or vomiting that does not go away. These symptoms may represent a serious  problem that is an emergency. Do not wait to see if the symptoms will go away. Get medical help right away. Call your local emergency services (911 in the [place] Do not drive yourself to the hospital. Summary  Hyperglycemia occurs when the level of sugar (glucose) in the blood is too high.  Hyperglycemia is diagnosed with a blood test to measure your blood glucose level. This blood test is usually done while you are having symptoms. Your health care provider may also do a physical exam and review your medical history.  If you have diabetes, follow your diabetes management plan as told by your health care provider.  Contact your health care provider if you have problems keeping your blood glucose in your target range. This information is not intended to replace advice given to you by your health care provider. Make sure you discuss any questions you have with your health care provider. Document Released: 05/31/2001 Document Revised: 08/22/2016 Document Reviewed: 08/22/2016 Elsevier Patient Education  2020 [employer].  _______________________________________________________________________________   Carbohydrate Counting For People With Diabetes  Why Is Carbohydrate Counting Important?  Counting carbohydrate servings may help you control your blood glucose level so that you feel better.   The balance between the carbohydrates you eat and insulin determines what your blood glucose level will be after eating.   Carbohydrate counting can also help you  plan your meals. Which Foods Have Carbohydrates? Foods with carbohydrates include:  Breads, crackers, and cereals   Pasta, rice, and grains   Starchy vegetables, such as potatoes, corn, and peas   Beans and legumes   Milk, soy milk, and yogurt   Fruits and fruit juices   Sweets, such as cakes, cookies, ice cream, jam, and jelly Carbohydrate Servings In diabetes meal planning, 1 serving of a food with carbohydrate has about 15 grams of carbohydrate:  Check serving sizes with measuring cups and spoons or a food scale.   Read the Nutrition Facts on food labels to find out how many grams of carbohydrate are in foods you eat. The food lists in this handout show portions that have about 15 grams of carbohydrate.  Tips Meal Planning Tips  An Eating Plan tells you how many carbohydrate servings to eat at your meals and snacks. For many adults, eating 3 to 5 servings of carbohydrate foods at each meal and 1 or 2 carbohydrate servings for each snack works well.   In a healthy daily Eating Plan, most carbohydrates come from:   At least 6 servings of fruits and nonstarchy vegetables   At least 6 servings of grains, beans, and starchy vegetables, with at least 3 servings from whole grains   At least 2 servings of milk or milk products  Check your blood glucose level regularly. It can tell you if you need to adjust when you eat carbohydrates.   Eating foods that have fiber, such as whole grains, and having very few salty foods is good for your health.   Eat 4 to 6 ounces of meat or other protein foods (such as soybean burgers) each day. Choose low-fat sources of protein, such as lean beef, lean pork, chicken, fish, low-fat cheese, or vegetarian foods such as soy.   Eat some healthy fats, such as olive oil, canola oil, and nuts.   Eat very little saturated fats. These unhealthy fats are found in butter, cream, and high-fat meats, such as bacon and sausage.   Eat very little or no  trans fats. These unhealthy fats  are found in all foods that list partially hydrogenated oil as an ingredient.  Label Reading Tips The Nutrition Facts panel on a label lists the grams of total carbohydrate in 1 standard serving. The label's standard serving may be larger or smaller than 1 carbohydrate serving. To figure out how many carbohydrate servings are in the food:  First, look at the label's standard serving size.   Check the grams of total carbohydrate. This is the amount of carbohydrate in 1 standard serving.   Divide the grams of total carbohydrate by 15. This number equals the number of carbohydrate servings in 1 standard serving. Remember: 1 carbohydrate serving is 15 grams of carbohydrate.   Note: You may ignore the grams of sugars on the Nutrition Facts panel because they are included in the grams of total carbohydrate.  Foods Recommended 1 serving = about 15 grams of carbohydrate Starches  1 slice bread (1 ounce)   1 tortilla (6-inch size)    large bagel (1 ounce)   2 taco shells (5-inch size)    hamburger or hot dog bun ( ounce)    cup ready-to-eat unsweetened cereal    cup cooked cereal   1 cup broth-based soup   4 to 6 small crackers   1/3 cup pasta or rice (cooked)    cup beans, peas, corn, sweet potatoes, winter squash, or mashed or boiled potatoes (cooked)    large baked potato (3 ounces)    ounce pretzels, potato chips, or tortilla chips   3 cups popcorn (popped) Fruit  1 small fresh fruit ( to 1 cup)    cup canned or frozen fruit   2 tablespoons dried fruit (blueberries, cherries, cranberries, mixed fruit, raisins)   17 small grapes (3 ounces)   1 cup melon or berries    cup unsweetened fruit juice Milk  1 cup fat-free or reduced-fat milk   1 cup soy milk   2/3 cup (6 ounces) nonfat yogurt sweetened with sugar-free sweetener Sweets and Desserts  2-inch square cake (unfrosted)   2 small cookies (2/3 ounce)     cup ice cream or frozen yogurt    cup sherbet or sorbet   1 tablespoon syrup, jam, jelly, table sugar, or honey   2 tablespoons light syrup Other Foods  Count 1 cup raw vegetables or  cup cooked nonstarchy vegetables as zero (0) carbohydrate servings or free foods. If you eat 3 or more servings at one meal, count them as 1 carbohydrate serving.   Foods that have less than 20 calories in each serving also may be counted as zero carbohydrate servings or free foods.   Count 1 cup of casserole or other mixed foods as 2 carbohydrate servings.  Carbohydrate Counting for People with Diabetes Sample 1-Day Menu  Breakfast 1 extra-small banana (1 carbohydrate serving)  1 cup low-fat or fat-free milk (1 carbohydrate serving)  1 slice whole wheat bread (1 carbohydrate serving)  1 teaspoon margarine  Lunch 2 ounces [place] slices  2 slices whole wheat bread (2 carbohydrate servings)  2 lettuce leaves  4 celery sticks  4 carrot sticks  1 medium apple (1 carbohydrate serving)  1 cup low-fat or fat-free milk (1 carbohydrate serving)  Afternoon Snack 2 tablespoons raisins (1 carbohydrate serving)  3/4 ounce unsalted mini pretzels (1 carbohydrate serving)  Evening Meal 3 ounces lean roast beef  1/2 large baked potato (2 carbohydrate servings)  1 tablespoon reduced-fat sour cream  1/2 cup green beans  1 tablespoon light  salad dressing  1 whole wheat dinner roll (1 carbohydrate serving)  1 teaspoon margarine  1 cup melon balls (1 carbohydrate serving)  Evening Snack 2 tablespoons unsalted nuts   Carbohydrate Counting for People with Diabetes Vegan Sample 1-Day Menu  Breakfast 1 cup cooked oatmeal (2 carbohydrate servings)   cup blueberries (1 carbohydrate serving)  2 tablespoons flaxseeds  1 cup soymilk fortified with calcium and vitamin D  1 cup coffee  Lunch 2 slices whole wheat bread (2 carbohydrate servings)   cup baked tofu   cup lettuce  2 slices tomato  2 slices avocado    cup baby carrots  1 orange (1 carbohydrate serving)  1 cup soymilk fortified with calcium and vitamin D   Evening Meal Burrito made with: 1 6-inch corn tortilla (1 carbohydrate serving)  1 cup refried vegetarian beans (1 carbohydrate serving)   cup chopped tomatoes   cup lettuce   cup salsa  1/3 cup [place] rice (1 carbohydrate serving)  1 tablespoon olive oil for rice   cup zucchini   Evening Snack 6 small whole grain crackers (1 carbohydrate serving)  2 apricots ( carbohydrate serving)   cup unsalted peanuts ( carbohydrate serving)     Carbohydrate Counting for People with Diabetes Vegetarian (Lacto-Ovo) Sample 1-Day Menu  Breakfast 1 cup cooked oatmeal (2 carbohydrate servings)   cup blueberries (1 carbohydrate serving)  2 tablespoons flaxseeds  1 egg  1 cup 1% milk (1 carbohydrate serving)  1 cup coffee  Lunch 2 slices whole wheat bread (2 carbohydrate servings)  2 ounces low-fat cheese   cup lettuce  2 slices tomato  2 slices avocado   cup baby carrots  1 orange (1 carbohydrate serving)  1 cup unsweetened tea  Evening Meal Burrito made with: 1 6-inch corn tortilla (1 carbohydrate serving)   cup refried vegetarian beans (1 carbohydrate serving)   cup tomatoes   cup lettuce   cup salsa  1/3 cup [place] rice (1 carbohydrate serving)  1 tablespoon olive oil for rice   cup zucchini  1 cup 1% milk (1 carbohydrate serving)  Evening Snack 6 small whole grain crackers (1 carbohydrate serving)  2 apricots ( carbohydrate serving)   cup unsalted peanuts ( carbohydrate serving)    Copyright 2020  Academy of Nutrition and Dietetics. All rights reserved.  Using Nutrition Labels: Carbohydrate   Serving Size   Look at the serving size. All the information on the label is based on this portion.  Servings Per Container   The number of servings contained in the package.  Guidelines for Carbohydrate   Look at the total grams of carbohydrate in the serving  size.   1 carbohydrate choice = 15 grams of carbohydrate. Range of Carbohydrate Grams Per Choice  Carbohydrate Grams/Choice Carbohydrate Choices  6-10   11-20 1  21-25 1  26-35 2  36-40 2  41-50 3  51-55 3  56-65 4  66-70 4  71-80 5    Copyright 2020  Academy of Nutrition and Dietetics. All rights reserved.  Corrin Parker, MS, RD, LDN Clinical Dietitian Office Phone [phone]

## 2019-09-19 NOTE — Plan of Care (Signed)

## 2019-09-20 LAB — BASIC METABOLIC PANEL
Anion gap: 8 (ref 5–15)
BUN: 17 mg/dL (ref 8–23)
CO2: 24 mmol/L (ref 22–32)
Calcium: 7.9 mg/dL — ABNORMAL LOW (ref 8.9–10.3)
Chloride: 113 mmol/L — ABNORMAL HIGH (ref 98–111)
Creatinine, Ser: 1.15 mg/dL — ABNORMAL HIGH (ref 0.44–1.00)
GFR calc Af Amer: 57 mL/min — ABNORMAL LOW (ref >60)
GFR calc non Af Amer: 49 mL/min — ABNORMAL LOW (ref >60)
Glucose, Bld: 130 mg/dL — ABNORMAL HIGH (ref 70–99)
Potassium: 3.6 mmol/L (ref 3.5–5.1)
Sodium: 145 mmol/L (ref 135–145)

## 2019-09-20 LAB — GLUCOSE, CAPILLARY
Glucose-Capillary: 128 mg/dL — ABNORMAL HIGH (ref 70–99)
Glucose-Capillary: 139 mg/dL — ABNORMAL HIGH (ref 70–99)
Glucose-Capillary: 155 mg/dL — ABNORMAL HIGH (ref 70–99)
Glucose-Capillary: 199 mg/dL — ABNORMAL HIGH (ref 70–99)
Glucose-Capillary: 206 mg/dL — ABNORMAL HIGH (ref 70–99)
Glucose-Capillary: 234 mg/dL — ABNORMAL HIGH (ref 70–99)

## 2019-09-20 LAB — CBC
HCT: 46 % (ref 36.0–46.0)
Hemoglobin: 14.5 g/dL (ref 12.0–15.0)
MCH: 30.1 pg (ref 26.0–34.0)
MCHC: 31.5 g/dL (ref 30.0–36.0)
MCV: 95.6 fL (ref 80.0–100.0)
Platelets: 141 10*3/uL — ABNORMAL LOW (ref 150–400)
RBC: 4.81 MIL/uL (ref 3.87–5.11)
RDW: 12.9 % (ref 11.5–15.5)
WBC: 3 10*3/uL — ABNORMAL LOW (ref 4.0–10.5)
nRBC: 0 % (ref 0.0–0.2)

## 2019-09-20 LAB — MAGNESIUM: Magnesium: 1.8 mg/dL (ref 1.7–2.4)

## 2019-09-20 LAB — PHOSPHORUS: Phosphorus: 3.4 mg/dL (ref 2.5–4.6)

## 2019-09-20 MED ORDER — AMLODIPINE BESYLATE 5 MG PO TABS
5.0000 mg | ORAL_TABLET | Freq: Once | ORAL | Status: AC
  Administered 2019-09-20: 5 mg via ORAL
  Filled 2019-09-20: qty 1

## 2019-09-20 MED ORDER — LORATADINE 10 MG PO TABS
10.0000 mg | ORAL_TABLET | Freq: Every day | ORAL | Status: DC
  Administered 2019-09-20 – 2019-09-24 (×5): 10 mg via ORAL
  Filled 2019-09-20 (×5): qty 1

## 2019-09-20 MED ORDER — AMLODIPINE BESYLATE 10 MG PO TABS
10.0000 mg | ORAL_TABLET | Freq: Every day | ORAL | Status: DC
  Administered 2019-09-21 – 2019-09-24 (×4): 10 mg via ORAL
  Filled 2019-09-20 (×4): qty 1

## 2019-09-20 MED ORDER — CARVEDILOL 6.25 MG PO TABS
6.2500 mg | ORAL_TABLET | Freq: Two times a day (BID) | ORAL | Status: DC
  Administered 2019-09-20 – 2019-09-24 (×9): 6.25 mg via ORAL
  Filled 2019-09-20 (×9): qty 1

## 2019-09-20 MED ORDER — LABETALOL HCL 5 MG/ML IV SOLN: 10.0000 mg | INTRAVENOUS | Status: DC | PRN

## 2019-09-20 MED ORDER — FLUTICASONE PROPIONATE 50 MCG/ACT NA SUSP
1.0000 | Freq: Every day | NASAL | Status: DC
  Administered 2019-09-20 – 2019-09-24 (×5): 1 via NASAL
  Filled 2019-09-20: qty 16

## 2019-09-20 NOTE — Progress Notes (Addendum)
Speech Language Pathology Treatment: Dysphagia  Patient Details Name: Amanda [name] MRN: [id number] DOB: [birth year] Today's Date: 09/20/2019 Time: 1130-1205 SLP Time Calculation (min) (ACUTE ONLY): 35 min  Assessment / Plan / Recommendation Clinical Impression  Pt seen at bedside to assess tolerance of advanced consistencies. Pt was observed during lunch (regular diet/thin liquids). Pt accepted assistance with set up (cutting solids, opening containers) for energy conservation. She independently adhered to routine safe swallow strategies - small bites/sips at slow rate, upright position. Intermittent congested cough noted, however, pt does not report difficulty swallowing. Pt attributes this cough to long term exposure to second hand smoke. MBS results indicated intermittent shallow penetration of liquids, but no aspiration. Pt has demonstrated significant improvement in mental status since MBS on 09/14/2019.   Pt appears to tolerate current diet without overt difficulty. MD notes indicate clear lung sounds, pt afebrile. Pt was encouraged to continue to take small bites and sips, and remain upright during meals. SLP will continue to follow to assess diet tolerance and provide education. RN aware.   HPI HPI: Pt is a 68 yo female presenting with AMS. 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 Pt required intubation 9/21-9/25. PMH: DM, HTN, HLD      SLP Plan  Continue with current plan of care       Recommendations  Diet recommendations: Regular;Thin liquid Liquids provided via: Straw;Cup Medication Administration: Whole meds with puree Supervision: Patient able to self feed Compensations: Slow rate;Small sips/bites Postural Changes and/or Swallow Maneuvers: Seated upright 90 degrees                Oral Care Recommendations: Oral care BID Follow up Recommendations: None SLP Visit  Diagnosis: Dysphagia, oropharyngeal phase (R13.12) Plan: Continue with current plan of care       GO               Amanda [name] B. Amanda [name], [facility], CCC-SLP Speech Language Pathologist Office: [phone] Pager: [phone]  Amanda, [name] 09/20/2019, 12:12 PM

## 2019-09-20 NOTE — Plan of Care (Signed)

## 2019-09-20 NOTE — TOC Initial Note (Signed)
Transition of Care [facility]) - Initial/Assessment Note    Patient Details  Name: Amanda [name] MRN: [id number] Date of Birth: 01-06-1951  [facility] CM/SW Contact:    Vinie Sill, [place] Phone Number: 09/20/2019, 5:33 PM  Clinical Narrative:                  CSW visit with the patient at bedside. CSW introduced self and explained role.CSW discussed PT recommendation of ST rehab at SNF before returning home. Patient states she lives in the home with her spouse but he is disable. Patient is agreeable to discharge plan of  ST rehab at SNF. Patient states no preference. Patient gave CSW permission to send out referral to SNFs in the area. Patient states no questions or concerns at this time. CSW will follow and provide SNF offers once they are available.   Thurmond Butts, MSW, LCSWA Clinical Social Worker [phone]    Barriers to Discharge: Insurance Authorization(Pending CIR ins auth and bed)   Patient Goals and CMS Choice        Expected Discharge Plan and Services                                                Prior Living Arrangements/Services                       Activities of Daily Living      Permission Sought/Granted                  Emotional Assessment              Admission diagnosis:  AMS Patient Active Problem List   Diagnosis Date Noted  . Acute metabolic encephalopathy 59/74/1638  . Acute on chronic renal failure ([place])   . Respiratory failure ([place])   . Hypertensive emergency   . Diabetic ketoacidosis with coma associated with type 2 diabetes mellitus ([place])   . Acute encephalopathy 09/09/2019  . Flu-like symptoms 02/06/2019  . Gait abnormality 01/10/2019  . Near syncope 11/26/2018  . Nail fungus 10/23/2017  . Cervical radiculopathy due to degenerative joint disease of spine 05/03/2017  . 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 Pain of both shoulder joints 11/14/2016  . Orthostatic hypotension  09/15/2016  . Vitreous hemorrhage of left eye ([place]) 05/13/2016  . Osteopenia 05/04/2016  . Posterior capsular opacification of left eye, obscuring vision 02/15/2016  . MDD (major depressive disorder), recurrent episode, moderate ([place]) 02/08/2016  . Malaise 11/24/2015  . Pseudophakia of both eyes 07/27/2015  . Constipation 09/16/2014  . Healthcare maintenance 06/03/2013  . Diabetic neuropathy ([place]) 05/20/2013  . Type 2 diabetes mellitus with both eyes affected by proliferative retinopathy and macular edema, with long-term current use of insulin ([place]) 04/19/2013  . Seasonal allergies 03/12/2013  . Dyslipidemia 02/15/2013  . DM II (diabetes mellitus, type II), controlled ([place]) 02/14/2013  . Need for immunization against influenza 02/14/2013  . Normocytic anemia 02/14/2013  . Essential hypertension 05/10/2010   PCP:  [name], MD Pharmacy:   [place], [place] - [address] [facility] [address] [place] [address] Phone: [phone] Fax: [place] [address] [place]), [place] - [address] [place] ([place]) [place] [address] Phone: [phone] Fax: [phone]  Social Determinants of Health (SDOH) Interventions    Readmission Risk Interventions No flowsheet data found.

## 2019-09-20 NOTE — Progress Notes (Signed)
PROGRESS NOTE    Amanda [name]  MRN:[id number] DOB: 01/18/1951 DOA: 09/09/2019 PCP: [name], William B, MD   Brief Narrative:  Patient is 68 year old female with history of diabetes, HTN, and neuropathy presented to the hospital from home with altered mental status. EMS had reported that patient was found alert with eyes open, not following commands, and left arm contracted in at the base of her disabled husband bed covered in feces. Husband not able to provide any insight on scene or last seen well. Neighbors had reported that her car door had been opened since last night and front door found open. Found to glucose reading high.   In ER, patient febrile 101.9 rectal, hypertensive 207/104, and tachycardic. CT head and CTA head and neck without acute findings. Labs noted for Na 139, CO2 21, glucose 522, BUN 36, sCr 1.87, CK 125, LA 2.6->3, WBC 14.4, normal coags, UA with high glucose, 5 ketones, >300 protein. She required intubation for airway protection after some desaturation episodes in the 80's. Neurology consulted. Cultures sent and started on empiric vancomycin, cefepime, and flagyl and ceftriaxone add for meningeal coverage. 30 cc/kg fluid bolus given. EEG showing negative for seizure activity, showing encephalopathy. LPcultures were negative. CSF HSV DNA was negative.. PCCMadmitted the patient as the patient was on mechanical ventilation. She was extubated on 09/13/2019. Patient was transferred to the hospital service on 09/14/2019. Patient seen by neurology. Mental status slowly improving.  PT OT continues to follow.  Subjective: Patient feels better today.  Still has generalized weakness.  She was not able to ambulate much.  She complains of bilateral ear discomfort and has history of sinus problems and nasal drainage.  Assessment & Plan:  Acute metabolic encephalopathy: Improved, oriented to time place and person.  MRI suspicious for PRES, although findings could also be  suggestive of CO poisoning and severe hypoglycemic brain injury, likely multifactorial acute on chronic renal failure, PRES,HONK, hypertensive encephalopathy possible infectious process, (aspiration).  EEG with severe diffuse encephalopathy no seizure, ammonia 22, pro-Cal 6.4, UA negative for pyuria.   Rhabdomyolysis CK up to 2271 -improved.  Received IV fluid hydration while in the hospital.  Off IV fluids at this time.  Encourage oral fluids.  Bilateral ear discomfort/sinus stuffiness.  Will add Flonase.  Avoid decongestant due to malignant hypertension.  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  We will continue to hold ACE inhibitor's.  Off IV fluids.  Malignant hypertension blood pressure is much controlled at this time.  Lisinopril on hold.  Patient has been started on amlodipine. Added low-dose Coreg yesterday.  Will increase the dose today.  Off normal saline.  SIRS finished 5 days of cefepime to cover possible aspiration pneumonitis.  Blood culture no growth to date.  Dysphagia -speech therapy on board.  Patient was initially on dysphagia 1 diet.  Patient has been upgraded to regular diet at this time.   Diabetes mellitus type 2 with uncontrolled hyperglycemia A1c 10.9 on 9/21.  Metformin, Ozempic remains in hold.  Patient was started on Lantus 5 units, not on insulin prior to admission.  Continue sliding scale insulin, Lantus.  Blood glucose levels are relatively controlled.  Last blood glucose level of 130  Deconditioning/Debility: continue PT OT, patient has been denied by inpatient rehab.  Consulted social worker for skilled nursing facility placement.  DVT prophylaxis:Heparin subcu  Code Status: full  Family Communication: Spoke with   the patient at bedside  Disposition Plan  Denied by CIR. recommend skilled nursing facility placement.  Social workers have been consulted  Consultants:    PCCM  Neurology   CIR  Procedures: MRI Intubation and mechanical ventilation Lumbar puncture EEG  Microbiology: CSF culture negative.   Respiratory virus panel negative Urine culture negative Blood culture negative  Antimicrobials: Anti-infectives (From admission, onward)   Start     Dose/Rate Route Frequency Ordered Stop   09/11/19 0600  acyclovir (ZOVIRAX) 500 mg in dextrose 5 % 100 mL IVPB  Status:  Discontinued     500 mg 110 mL/hr over 60 Minutes Intravenous Every 24 hours 09/10/19 0641 09/12/19 0932   09/10/19 1800  ceFEPIme (MAXIPIME) 2 g in sodium chloride 0.9 % 100 mL IVPB  Status:  Discontinued     2 g 200 mL/hr over 30 Minutes Intravenous Every 24 hours 09/09/19 1835 09/10/19 0612   09/10/19 1800  ceFEPIme (MAXIPIME) 2 g in sodium chloride 0.9 % 100 mL IVPB     2 g 200 mL/hr over 30 Minutes Intravenous Every 24 hours 09/10/19 0612 09/14/19 1726   09/10/19 1330  vancomycin (VANCOCIN) IVPB 1000 mg/200 mL premix  Status:  Discontinued     1,000 mg 200 mL/hr over 60 Minutes Intravenous Every 48 hours 09/10/19 1251 09/12/19 0905   09/10/19 0645  acyclovir (ZOVIRAX) 500 mg in dextrose 5 % 100 mL IVPB     500 mg 110 mL/hr over 60 Minutes Intravenous  Once 09/10/19 0641 09/10/19 0828   09/09/19 2100  cefTRIAXone (ROCEPHIN) 2 g in sodium chloride 0.9 % 100 mL IVPB     2 g 200 mL/hr over 30 Minutes Intravenous  Once 09/09/19 2056 09/09/19 2159   09/09/19 1834  vancomycin variable dose per unstable renal function (pharmacist dosing)  Status:  Discontinued      Does not apply See admin instructions 09/09/19 1835 09/10/19 1251   09/09/19 1645  ceFEPIme (MAXIPIME) 2 g in sodium chloride 0.9 % 100 mL IVPB     2 g 200 mL/hr over 30 Minutes Intravenous  Once 09/09/19 1638 09/09/19 1736   09/09/19 1645  metroNIDAZOLE (FLAGYL) IVPB 500 mg     500 mg 100 mL/hr over 60 Minutes Intravenous  Once 09/09/19 1638 09/09/19 1900   09/09/19 1645  vancomycin (VANCOCIN) IVPB 1000  mg/200 mL premix     1,000 mg 200 mL/hr over 60 Minutes Intravenous  Once 09/09/19 1638 09/09/19 1900      Objective: Vitals:   09/19/19 1530 09/19/19 2054 09/20/19 0445 09/20/19 0734  BP: 137/65 132/62 (!) 148/73 (!) 154/70  Pulse: 87 76 79 85  Resp: _0 Temp: 98.6 F (37 C) 98.8 F (37.1 C) 98.9 F (37.2 C) 99 F (37.2 C)  TempSrc: Oral Oral Oral Oral  SpO2: 97% 99% 97% 100%  Weight:      Height:        Intake/Output Summary (Last 24 hours) at 09/20/2019 1011 Last data filed at 09/20/2019 0900 Gross per 24 hour  Intake 480 ml  Output -  Net 480 ml   Filed Weights   09/13/19 0500 09/17/19 0500 09/18/19 0500  Weight: 56.8 kg 59 kg 58.6 kg   Weight change:   Body mass index is 22.18 kg/m.  Intake/Output from previous day: 10/01 0701 - 10/02 0700 In: 240 [P.O.:240] Out: -  Intake/Output this shift: Total I/O In: 240 [P.O.:240] Out: -   Examination: General:  Average built,  not in obvious distress, older than her stated age, thinly built HENT: Normocephalic, pupils equally reacting to light and accommodation.  No scleral pallor or icterus noted. Oral mucosa is moist.  Chest:  Clear breath sounds.  Diminished breath sounds bilaterally. No crackles or wheezes.  CVS: S1 &S2 heard. No murmur.  Regular rate and rhythm. Abdomen: Soft, nontender, nondistended.  Bowel sounds are heard.  Liver is not palpable, no abdominal mass palpated Extremities: No cyanosis, clubbing or edema.  Peripheral pulses are palpable. Psych: Alert, awake and oriented, CNS:  No cranial nerve deficits.  Moving all extremities  skin: Warm and dry.  No rashes noted.   Medications:  Scheduled Meds: . amLODipine  5 mg Oral Daily  . carvedilol  3.125 mg Oral BID WC  . chlorhexidine gluconate (MEDLINE KIT)  15 mL Mouth Rinse BID  . docusate  100 mg Oral Daily  . feeding supplement (ENSURE ENLIVE)  237 mL Oral BID BM  . heparin  5,000 Units Subcutaneous Q8H  . insulin aspart  0-15  Units Subcutaneous Q4H  . insulin detemir  5 Units Subcutaneous Daily  . pantoprazole sodium  40 mg Oral Daily   Continuous Infusions: . sodium chloride 10 mL/hr at 09/13/19 2200    Data Reviewed: I have personally reviewed following labs and imaging studies  CBC: Recent Labs  Lab 09/14/19 0400 09/16/19 0313 09/19/19 0221 09/20/19 0402  WBC 10.7* 8.8 6.4 3.0*  NEUTROABS 7.4  --   --   --   HGB 9.6* 8.0* 8.1* 14.5  HCT 29.0* 24.3* 25.8* 46.0  MCV 93.5 93.1 95.9 95.6  PLT 291 245 273 141*   Basic Metabolic Panel: Recent Labs  Lab 09/14/19 0400  09/16/19 0313 09/17/19 0536 09/18/19 0222 09/19/19 0221 09/20/19 0402  NA 143   < > 146* 143 144 147* 145  K 4.0   < > 3.6 3.2* 3.8 3.5 3.6  CL 117*   < > 121* 111 118* 116* 113*  CO2 16*   < > 21* 21* 22 24 24  GLUCOSE 125*   < > 107* 133* 112* 115* 130*  BUN 35*   < > 20 20 24* 23 17  CREATININE 1.73*   < > 1.28* 1.49* 1.56* 1.27* 1.15*  CALCIUM 8.3*   < > 8.1* 8.1* 7.8* 8.0* 7.9*  MG 2.2  --   --   --   --   --  1.8  PHOS 3.3  --   --   --   --   --  3.4   < > = values in this interval not displayed.   GFR: Estimated Creatinine Clearance: 41 mL/min (A) (by C-G formula based on SCr of 1.15 mg/dL (H)). Liver Function Tests: Recent Labs  Lab 09/16/19 0313  AST 29  ALT 32  ALKPHOS 54  BILITOT 0.5  PROT 5.0*  ALBUMIN 2.2*   No results for input(s): LIPASE, AMYLASE in the last 168 hours. No results for input(s): AMMONIA in the last 168 hours. Coagulation Profile: No results for input(s): INR, PROTIME in the last 168 hours. Cardiac Enzymes: Recent Labs  Lab 09/13/19 1119 09/14/19 0400 09/16/19 0313  CKTOTAL 655* 769* 309*  CKMB  --  7.2*  --    BNP (last 3 results) No results for input(s): PROBNP in the last 8760 hours. HbA1C: No results for input(s): HGBA1C in the last 72 hours. CBG: Recent Labs  Lab 09/19/19 1626 09/19/19 2125 09/20/19 0015 09/20/19 0441 09/20/19 0731    GLUCAP 280* 172* 128* 139*  155*   Lipid Profile: No results for input(s): CHOL, HDL, LDLCALC, TRIG, CHOLHDL, LDLDIRECT in the last 72 hours. Thyroid Function Tests: No results for input(s): TSH, T4TOTAL, FREET4, T3FREE, THYROIDAB in the last 72 hours. Anemia Panel: No results for input(s): VITAMINB12, FOLATE, FERRITIN, TIBC, IRON, RETICCTPCT in the last 72 hours. Sepsis Labs: Recent Labs  Lab 09/16/19 0313  PROCALCITON 4.25    Recent Results (from the past 240 hour(s))  Respiratory Panel by PCR     Status: None   Collection Time: 09/11/19 10:45 AM   Specimen: Nasopharyngeal Swab; Respiratory  Result Value Ref Range Status   Adenovirus NOT DETECTED NOT DETECTED Final   Coronavirus 229E NOT DETECTED NOT DETECTED Final    Comment: (NOTE) The Coronavirus on the Respiratory Panel, DOES NOT test for the novel  Coronavirus (2019 nCoV)    Coronavirus HKU1 NOT DETECTED NOT DETECTED Final   Coronavirus NL63 NOT DETECTED NOT DETECTED Final   Coronavirus OC43 NOT DETECTED NOT DETECTED Final   Metapneumovirus NOT DETECTED NOT DETECTED Final   Rhinovirus / Enterovirus NOT DETECTED NOT DETECTED Final   Influenza A NOT DETECTED NOT DETECTED Final   Influenza B NOT DETECTED NOT DETECTED Final   Parainfluenza Virus 1 NOT DETECTED NOT DETECTED Final   Parainfluenza Virus 2 NOT DETECTED NOT DETECTED Final   Parainfluenza Virus 3 NOT DETECTED NOT DETECTED Final   Parainfluenza Virus 4 NOT DETECTED NOT DETECTED Final   Respiratory Syncytial Virus NOT DETECTED NOT DETECTED Final   Bordetella pertussis NOT DETECTED NOT DETECTED Final   Chlamydophila pneumoniae NOT DETECTED NOT DETECTED Final   Mycoplasma pneumoniae NOT DETECTED NOT DETECTED Final    Comment: Performed at [facility], [address] [place], [place] [address]  Culture, Urine     Status: None   Collection Time: 09/15/19  8:46 AM   Specimen: Urine, Random  Result Value Ref Range Status   Specimen Description URINE, RANDOM  Final   Special Requests  NONE  Final   Culture   Final    NO GROWTH Performed at [facility], [place] [address] [place], [place] [address]    Report Status 09/16/2019 FINAL  Final      Radiology Studies: No results found.    LOS: 11 days    Flora Lipps, MD [facility]  09/20/2019, 10:11 AM

## 2019-09-21 LAB — GLUCOSE, CAPILLARY
Glucose-Capillary: 107 mg/dL — ABNORMAL HIGH (ref 70–99)
Glucose-Capillary: 143 mg/dL — ABNORMAL HIGH (ref 70–99)
Glucose-Capillary: 177 mg/dL — ABNORMAL HIGH (ref 70–99)
Glucose-Capillary: 183 mg/dL — ABNORMAL HIGH (ref 70–99)
Glucose-Capillary: 218 mg/dL — ABNORMAL HIGH (ref 70–99)
Glucose-Capillary: 222 mg/dL — ABNORMAL HIGH (ref 70–99)
Glucose-Capillary: 281 mg/dL — ABNORMAL HIGH (ref 70–99)

## 2019-09-21 LAB — CBC
HCT: 24.2 % — ABNORMAL LOW (ref 36.0–46.0)
Hemoglobin: 7.8 g/dL — ABNORMAL LOW (ref 12.0–15.0)
MCH: 30.7 pg (ref 26.0–34.0)
MCHC: 32.2 g/dL (ref 30.0–36.0)
MCV: 95.3 fL (ref 80.0–100.0)
Platelets: 244 10*3/uL (ref 150–400)
RBC: 2.54 MIL/uL — ABNORMAL LOW (ref 3.87–5.11)
RDW: 12.9 % (ref 11.5–15.5)
WBC: 6.5 10*3/uL (ref 4.0–10.5)
nRBC: 0 % (ref 0.0–0.2)

## 2019-09-21 LAB — BASIC METABOLIC PANEL
Anion gap: 7 (ref 5–15)
BUN: 17 mg/dL (ref 8–23)
CO2: 24 mmol/L (ref 22–32)
Calcium: 8.2 mg/dL — ABNORMAL LOW (ref 8.9–10.3)
Chloride: 112 mmol/L — ABNORMAL HIGH (ref 98–111)
Creatinine, Ser: 1.19 mg/dL — ABNORMAL HIGH (ref 0.44–1.00)
GFR calc Af Amer: 55 mL/min — ABNORMAL LOW (ref >60)
GFR calc non Af Amer: 47 mL/min — ABNORMAL LOW (ref >60)
Glucose, Bld: 161 mg/dL — ABNORMAL HIGH (ref 70–99)
Potassium: 3.8 mmol/L (ref 3.5–5.1)
Sodium: 143 mmol/L (ref 135–145)

## 2019-09-21 LAB — MAGNESIUM: Magnesium: 1.8 mg/dL (ref 1.7–2.4)

## 2019-09-21 MED ORDER — INFLUENZA VAC A&B SA ADJ QUAD 0.5 ML IM PRSY
0.5000 mL | PREFILLED_SYRINGE | INTRAMUSCULAR | Status: AC
  Administered 2019-09-22: 0.5 mL via INTRAMUSCULAR
  Filled 2019-09-21: qty 0.5

## 2019-09-21 NOTE — Progress Notes (Signed)
PROGRESS NOTE    Amanda [name]  YYF:[id number] DOB: 01-Sep-1951 DOA: 09/09/2019 PCP: [name], MD    Brief Narrative:  68 yo female how presented with the chief complain of acute encephalopathy.  Her initial Glasgow Coma Scale was between 7 and 11, patient was intubated for airway protection, his initial vital signs, blood pressure 207/140, temperature 101.9, heart rate 125, respiratory 22.  Patient had decorticate posturing spontaneously, not opening eyes, her lungs had diffuse rhonchi bilaterally, heart S1-S2 present tachycardic, abdomen soft, no lower extremity edema.  Patient was admitted to the hospital with the working diagnosis of metabolic encephalopathy.   Further work-up with lumbar puncture was negative for infection.  Electroencephalography negative for seizures.  Her MRI was suspicious for PRES, findings also suggestive for possible carbon monoxide poisoning, and severe hypoglycemia.   Patient was liberated from mechanical ventilation on September 25.2020.  Transferred to [facility] September 14, 2019  Assessment & Plan:   Active Problems:   Acute encephalopathy   Acute metabolic encephalopathy   Acute on chronic renal failure (HCC)   Respiratory failure ([place])   1. Acute metabolic encephalopathy. Clinically patient's mentation has been improving. Continue physical therapy and nutritional support. Patient continue to be very weak and deconditioned, will need SNF.  2. Rhabdomyolysis. Clinically has improved, patient now off IV fluids. Renal function preserved.   3. AKI on CKD stage 3, to 4. Stable renal function, will continue to avoid nephrotoxic medications or hypotension.   4. Hypertensive emergency. Continue blood pressure control with amlodipine and carvedilol.   5. T2DM. Continue glucose cover and monitoring with insulin sliding scale.   DVT prophylaxis: enoxaparin   Code Status:  full Family Communication: no family at the bedside  Disposition Plan/  discharge barriers: pending SNf   Body mass index is 22.18 kg/m. Malnutrition Type:  Nutrition Problem: Inadequate oral intake Etiology: acute illness   Malnutrition Characteristics:  Signs/Symptoms: NPO status   Nutrition Interventions:  Interventions: Tube feeding  RN Pressure Injury Documentation:     Consultants:     Procedures:     Antimicrobials:       Subjective: Patient is feeling better, continue to be very weak and deconditioned, no confusion or agitation. No dyspnea or chest pain.   Objective: Vitals:   09/20/19 1358 09/20/19 2052 09/21/19 0349 09/21/19 0809  BP: 123/69 134/67 136/73 128/66  Pulse: 79 75 73 82  Resp: _0 Temp: 99 F (37.2 C) 98.5 F (36.9 C) 98.5 F (36.9 C) 98.7 F (37.1 C)  TempSrc: Oral Oral Oral Oral  SpO2: 98% 99% 98% 96%  Weight:      Height:        Intake/Output Summary (Last 24 hours) at 09/21/2019 1427 Last data filed at 09/21/2019 1300 Gross per 24 hour  Intake 880 ml  Output -  Net 880 ml   Filed Weights   09/13/19 0500 09/17/19 0500 09/18/19 0500  Weight: 56.8 kg 59 kg 58.6 kg    Examination:   General: Not in pain or dyspnea, deconditioned  Neurology: Awake and alert, non focal  E ENT: no pallor, no icterus, oral mucosa moist Cardiovascular: No JVD. S1-S2 present, rhythmic, no gallops, rubs, or murmurs. No lower extremity edema. Pulmonary: positive breath sounds bilaterally, adequate air movement, no wheezing, rhonchi or rales. Gastrointestinal. Abdomen with no organomegaly, non tender, no rebound or guarding Skin. No rashes Musculoskeletal: no joint deformities     Data Reviewed: I have personally reviewed following  PROGRESS NOTE    Amanda [name]  MRN:[id number] DOB: 06/04/1951 DOA: 09/09/2019 PCP: [name], William B, MD    Brief Narrative:  68 yo female how presented with the chief complain of acute encephalopathy.  Her initial Glasgow Coma Scale was between 7 and 11, patient was intubated for airway protection, his initial vital signs, blood pressure 207/140, temperature 101.9, heart rate 125, respiratory 22.  Patient had decorticate posturing spontaneously, not opening eyes, her lungs had diffuse rhonchi bilaterally, heart S1-S2 present tachycardic, abdomen soft, no lower extremity edema.  Patient was admitted to the hospital with the working diagnosis of metabolic encephalopathy.   Further work-up with lumbar puncture was negative for infection.  Electroencephalography negative for seizures.  Her MRI was suspicious for PRES, findings also suggestive for possible carbon monoxide poisoning, and severe hypoglycemia.   Patient was liberated from mechanical ventilation on September 25.2020.  Transferred to [facility] September 14, 2019  Assessment & Plan:   Active Problems:   Acute encephalopathy   Acute metabolic encephalopathy   Acute on chronic renal failure (HCC)   Respiratory failure (HCC)   1. Acute metabolic encephalopathy. Clinically patient's mentation has been improving. Continue physical therapy and nutritional support. Patient continue to be very weak and deconditioned, will need SNF.  2. Rhabdomyolysis. Clinically has improved, patient now off IV fluids. Renal function preserved.   3. AKI on CKD stage 3, to 4. Stable renal function, will continue to avoid nephrotoxic medications or hypotension.   4. Hypertensive emergency. Continue blood pressure control with amlodipine and carvedilol.   5. T2DM. Continue glucose cover and monitoring with insulin sliding scale.   DVT prophylaxis: enoxaparin   Code Status:  full Family Communication: no family at the bedside  Disposition Plan/  discharge barriers: pending SNf   Body mass index is 22.18 kg/m. Malnutrition Type:  Nutrition Problem: Inadequate oral intake Etiology: acute illness   Malnutrition Characteristics:  Signs/Symptoms: NPO status   Nutrition Interventions:  Interventions: Tube feeding  RN Pressure Injury Documentation:     Consultants:     Procedures:     Antimicrobials:       Subjective: Patient is feeling better, continue to be very weak and deconditioned, no confusion or agitation. No dyspnea or chest pain.   Objective: Vitals:   09/20/19 1358 09/20/19 2052 09/21/19 0349 09/21/19 0809  BP: 123/69 134/67 136/73 128/66  Pulse: 79 75 73 82  Resp: 18 16 16 16  Temp: 99 F (37.2 C) 98.5 F (36.9 C) 98.5 F (36.9 C) 98.7 F (37.1 C)  TempSrc: Oral Oral Oral Oral  SpO2: 98% 99% 98% 96%  Weight:      Height:        Intake/Output Summary (Last 24 hours) at 09/21/2019 1427 Last data filed at 09/21/2019 1300 Gross per 24 hour  Intake 880 ml  Output -  Net 880 ml   Filed Weights   09/13/19 0500 09/17/19 0500 09/18/19 0500  Weight: 56.8 kg 59 kg 58.6 kg    Examination:   General: Not in pain or dyspnea, deconditioned  Neurology: Awake and alert, non focal  E ENT: no pallor, no icterus, oral mucosa moist Cardiovascular: No JVD. S1-S2 present, rhythmic, no gallops, rubs, or murmurs. No lower extremity edema. Pulmonary: positive breath sounds bilaterally, adequate air movement, no wheezing, rhonchi or rales. Gastrointestinal. Abdomen with no organomegaly, non tender, no rebound or guarding Skin. No rashes Musculoskeletal: no joint deformities     Data Reviewed: I have personally reviewed following

## 2019-09-22 LAB — CBC WITH DIFFERENTIAL/PLATELET
Abs Immature Granulocytes: 0.06 10*3/uL (ref 0.00–0.07)
Basophils Absolute: 0 10*3/uL (ref 0.0–0.1)
Basophils Relative: 0 %
Eosinophils Absolute: 0.1 10*3/uL (ref 0.0–0.5)
Eosinophils Relative: 2 %
HCT: 24.4 % — ABNORMAL LOW (ref 36.0–46.0)
Hemoglobin: 7.7 g/dL — ABNORMAL LOW (ref 12.0–15.0)
Immature Granulocytes: 1 %
Lymphocytes Relative: 36 %
Lymphs Abs: 1.9 10*3/uL (ref 0.7–4.0)
MCH: 30.3 pg (ref 26.0–34.0)
MCHC: 31.6 g/dL (ref 30.0–36.0)
MCV: 96.1 fL (ref 80.0–100.0)
Monocytes Absolute: 0.4 10*3/uL (ref 0.1–1.0)
Monocytes Relative: 8 %
Neutro Abs: 2.7 10*3/uL (ref 1.7–7.7)
Neutrophils Relative %: 53 %
Platelets: 241 10*3/uL (ref 150–400)
RBC: 2.54 MIL/uL — ABNORMAL LOW (ref 3.87–5.11)
RDW: 12.7 % (ref 11.5–15.5)
WBC: 5.1 10*3/uL (ref 4.0–10.5)
nRBC: 0 % (ref 0.0–0.2)

## 2019-09-22 LAB — BASIC METABOLIC PANEL
Anion gap: 8 (ref 5–15)
BUN: 21 mg/dL (ref 8–23)
CO2: 26 mmol/L (ref 22–32)
Calcium: 8.1 mg/dL — ABNORMAL LOW (ref 8.9–10.3)
Chloride: 108 mmol/L (ref 98–111)
Creatinine, Ser: 1.34 mg/dL — ABNORMAL HIGH (ref 0.44–1.00)
GFR calc Af Amer: 47 mL/min — ABNORMAL LOW (ref >60)
GFR calc non Af Amer: 41 mL/min — ABNORMAL LOW (ref >60)
Glucose, Bld: 186 mg/dL — ABNORMAL HIGH (ref 70–99)
Potassium: 4.1 mmol/L (ref 3.5–5.1)
Sodium: 142 mmol/L (ref 135–145)

## 2019-09-22 LAB — GLUCOSE, CAPILLARY
Glucose-Capillary: 163 mg/dL — ABNORMAL HIGH (ref 70–99)
Glucose-Capillary: 217 mg/dL — ABNORMAL HIGH (ref 70–99)
Glucose-Capillary: 220 mg/dL — ABNORMAL HIGH (ref 70–99)
Glucose-Capillary: 158 mg/dL — ABNORMAL HIGH (ref 70–99)
Glucose-Capillary: 275 mg/dL — ABNORMAL HIGH (ref 70–99)

## 2019-09-22 MED ORDER — LORAZEPAM 2 MG/ML IJ SOLN
0.5000 mg | Freq: Once | INTRAMUSCULAR | Status: AC
  Administered 2019-09-22: 22:00:00 via INTRAVENOUS
  Filled 2019-09-22: qty 1

## 2019-09-22 MED ORDER — LATANOPROST 0.005 % OP SOLN
1.0000 [drp] | Freq: Every day | OPHTHALMIC | Status: DC
  Administered 2019-09-22 – 2019-09-23 (×2): 1 [drp] via OPHTHALMIC
  Filled 2019-09-22: qty 2.5

## 2019-09-22 NOTE — Progress Notes (Signed)
PROGRESS NOTE    Amanda [name]  ZOX:[id number] DOB: [birth year] DOA: 09/09/2019 PCP: Amanda Ingles, MD    Brief Narrative:  68 yo female how presented with the chief complain of acute encephalopathy.  Her initial Glasgow Coma Scale was between 7 and 11, patient was intubated for airway protection, his initial vital signs, blood pressure 207/140, temperature 101.9, heart rate 125, respiratory 22.  Patient had decorticate posturing spontaneously, not opening eyes, her lungs had diffuse rhonchi bilaterally, heart S1-S2 present tachycardic, abdomen soft, no lower extremity edema.  Patient was admitted to the hospital with the working diagnosis of metabolic encephalopathy.   Further work-up with lumbar puncture was negative for infection.  Electroencephalography negative for seizures.  Her MRI was suspicious for PRES, findings also suggestive for possible carbon monoxide poisoning, and severe hypoglycemia. EEG with severe and diffuse encephalopathy with no seizures.   Patient was liberated from mechanical ventilation on September 25.2020.  Transferred to [facility] September 14, 2019.  Patient has been recovering well, but continue to be very weak and deconditioned, now waiting for SNF placement.    Assessment & Plan:   Active Problems:   Acute encephalopathy   Acute metabolic encephalopathy   Acute on chronic renal failure (HCC)   Respiratory failure (HCC)   1. Acute metabolic encephalopathy/ multifactorial . Continue to improve her mentation but not yet back to baseline, positive generalized weakness and deconditioning. On physical therapy and nutritional support. Pending placement SNF.  2. Rhabdomyolysis. Resolved, renal function stable. Patient is tolerating po well.    3. AKI on CKD stage 3, to 4.  Stable renal function with serum cr at 1,34 with K at 4,1 and serum bicarbonate at 26. WIll continue to avoid nephrotoxic medications or hypotension.   4. Hypertensive emergency.  On amlodipine and carvedilol for blood pressure control.   5. T2DM. Fasting glucose 186, will continue with glucose cover and monitoring with insulin sliding scale.   DVT prophylaxis: enoxaparin   Code Status:  full Family Communication: no family at the bedside  Disposition Plan/ discharge barriers: pending SNf      Body mass index is 22.18 kg/m. Malnutrition Type:  Nutrition Problem: Inadequate oral intake Etiology: acute illness   Malnutrition Characteristics:  Signs/Symptoms: NPO status   Nutrition Interventions:  Interventions: Tube feeding  RN Pressure Injury Documentation:     Consultants:   Neurology   PCCM  CIR  Procedures:   LP   Antimicrobials:       Subjective: Patient continue to have generalized weakness and very deconditioned, intermittent nausea but no vomiting, no chest pain or dyspnea.   Objective: Vitals:   09/21/19 1540 09/21/19 1926 09/22/19 0325 09/22/19 0832  BP: 120/61 (!) 104/51 (!) 142/74 132/64  Pulse: 75 73 76 88  Resp: 15 16 17 16   Temp: 97.7 F (36.5 C) 98.9 F (37.2 C) 98.6 F (37 C) 98.6 F (37 C)  TempSrc: Oral Oral Oral Oral  SpO2: 98% 98% 95% 97%  Weight:      Height:        Intake/Output Summary (Last 24 hours) at 09/22/2019 1155 Last data filed at 09/22/2019 1000 Gross per 24 hour  Intake 880 ml  Output -  Net 880 ml   Filed Weights   09/13/19 0500 09/17/19 0500 09/18/19 0500  Weight: 56.8 kg 59 kg 58.6 kg    Examination:   General: Not in pain or dyspnea, deconditioned  Neurology: Awake and alert, non focal  E ENT: mild  pallor, no icterus, oral mucosa moist Cardiovascular: No JVD. S1-S2 present, rhythmic, no gallops, rubs, or murmurs. No lower extremity edema. Pulmonary: positive breath sounds bilaterally, adequate air movement, no wheezing, rhonchi or rales. Gastrointestinal. Abdomen with no organomegaly, non tender, no rebound or guarding Skin. No rashes Musculoskeletal: no joint  deformities     Data Reviewed: I have personally reviewed following labs and imaging studies  CBC: Recent Labs  Lab 09/16/19 0313 09/19/19 0221 09/20/19 0402 09/21/19 0632 09/22/19 0451  WBC 8.8 6.4 3.0* 6.5 5.1  NEUTROABS  --   --   --   --  2.7  HGB 8.0* 8.1* 14.5 7.8* 7.7*  HCT 24.3* 25.8* 46.0 24.2* 24.4*  MCV 93.1 95.9 95.6 95.3 96.1  PLT 245 273 141* 244 241   Basic Metabolic Panel: Recent Labs  Lab 09/18/19 0222 09/19/19 0221 09/20/19 0402 09/21/19 0632 09/22/19 0451  NA 144 147* 145 143 142  K 3.8 3.5 3.6 3.8 4.1  CL 118* 116* 113* 112* 108  CO2 22 24 24 24 26   GLUCOSE 112* 115* 130* 161* 186*  BUN 24* 23 17 17 21   CREATININE 1.56* 1.27* 1.15* 1.19* 1.34*  CALCIUM 7.8* 8.0* 7.9* 8.2* 8.1*  MG  --   --  1.8 1.8  --   PHOS  --   --  3.4  --   --    GFR: Estimated Creatinine Clearance: 35.2 mL/min (A) (by C-G formula based on SCr of 1.34 mg/dL (H)). Liver Function Tests: Recent Labs  Lab 09/16/19 0313  AST 29  ALT 32  ALKPHOS 54  BILITOT 0.5  PROT 5.0*  ALBUMIN 2.2*   No results for input(s): LIPASE, AMYLASE in the last 168 hours. No results for input(s): AMMONIA in the last 168 hours. Coagulation Profile: No results for input(s): INR, PROTIME in the last 168 hours. Cardiac Enzymes: Recent Labs  Lab 09/16/19 0313  CKTOTAL 309*   BNP (last 3 results) No results for input(s): PROBNP in the last 8760 hours. HbA1C: No results for input(s): HGBA1C in the last 72 hours. CBG: Recent Labs  Lab 09/21/19 1557 09/21/19 2044 09/21/19 2338 09/22/19 0332 09/22/19 0826  GLUCAP 222* 281* 107* 163* 217*   Lipid Profile: No results for input(s): CHOL, HDL, LDLCALC, TRIG, CHOLHDL, LDLDIRECT in the last 72 hours. Thyroid Function Tests: No results for input(s): TSH, T4TOTAL, FREET4, T3FREE, THYROIDAB in the last 72 hours. Anemia Panel: No results for input(s): VITAMINB12, FOLATE, FERRITIN, TIBC, IRON, RETICCTPCT in the last 72 hours.     Radiology Studies: I have reviewed all of the imaging during this hospital visit personally     Scheduled Meds: . amLODipine  10 mg Oral Daily  . carvedilol  6.25 mg Oral BID WC  . chlorhexidine gluconate (MEDLINE KIT)  15 mL Mouth Rinse BID  . docusate  100 mg Oral Daily  . feeding supplement (ENSURE ENLIVE)  237 mL Oral BID BM  . fluticasone  1 spray Each Nare Daily  . heparin  5,000 Units Subcutaneous Q8H  . insulin aspart  0-15 Units Subcutaneous Q4H  . insulin detemir  5 Units Subcutaneous Daily  . loratadine  10 mg Oral Daily  . pantoprazole sodium  40 mg Oral Daily   Continuous Infusions: . sodium chloride 10 mL/hr at 09/13/19 2200     LOS: 13 days        Kristain Hu Annett Gula, MD

## 2019-09-22 NOTE — TOC Progression Note (Signed)
Transition of Care [facility]) - Progression Note    Patient Details  Name: Amanda [name] MRN: [id number] Date of Birth: [birth year]  Transition of Care [facility]) CM/SW [place], [place] Phone Number: [phone] 09/22/2019, 3:40 PM  Clinical Narrative:     CSW met with patient to give bed offers. Patient informed CSW that she wanted to discuss the offers with her husband and daughter. CSW informed patient that the [facility] team will follow up with her tomorrow on selected facility.  TOC team with follow up with patient.  TOC team will continue to assist with discharge planning.   Expected Discharge Plan: Skilled Nursing Facility Barriers to Discharge: Insurance Authorization(Pending CIR ins auth and bed)  Expected Discharge Plan and Services Expected Discharge Plan: [place] arrangements for the past 2 months: Single Family Home                                       Social Determinants of Health (SDOH) Interventions    Readmission Risk Interventions No flowsheet data found.

## 2019-09-23 LAB — GLUCOSE, CAPILLARY
Glucose-Capillary: 124 mg/dL — ABNORMAL HIGH (ref 70–99)
Glucose-Capillary: 139 mg/dL — ABNORMAL HIGH (ref 70–99)
Glucose-Capillary: 184 mg/dL — ABNORMAL HIGH (ref 70–99)
Glucose-Capillary: 227 mg/dL — ABNORMAL HIGH (ref 70–99)
Glucose-Capillary: 264 mg/dL — ABNORMAL HIGH (ref 70–99)
Glucose-Capillary: 298 mg/dL — ABNORMAL HIGH (ref 70–99)

## 2019-09-23 MED ORDER — INSULIN ASPART 100 UNIT/ML ~~LOC~~ SOLN: 0.0000 [IU] | Freq: Every day | SUBCUTANEOUS | Status: DC

## 2019-09-23 MED ORDER — INSULIN ASPART 100 UNIT/ML ~~LOC~~ SOLN
0.0000 [IU] | Freq: Three times a day (TID) | SUBCUTANEOUS | Status: DC
  Administered 2019-09-23: 8 [IU] via SUBCUTANEOUS
  Administered 2019-09-23 – 2019-09-24 (×4): 5 [IU] via SUBCUTANEOUS

## 2019-09-23 NOTE — Progress Notes (Addendum)
Physical Therapy Treatment Patient Details Name: Amanda [name] MRN: [id number] DOB: 03-11-1951 Today's Date: 09/23/2019    History of Present Illness Pt is a 68 yo female presenting with AMS. 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 Pt required intubation 9/21-9/25. PMH: DM, HTN, HLD    PT Comments    Pt slightly limited by fatigue during session, but demonstrating improvements in LE strength/power, gait, functional mobility, and balance. Pt requiring decrease assistance for transfers and bed mobility. Pt will benefit from continued acute PT services to improve endurance and independence in OOB activity.   Follow Up Recommendations  SNF     Equipment Recommendations  Rolling walker with 5" wheels;3in1 (PT)    Recommendations for Other Services       Precautions / Restrictions Precautions Precautions: Fall Restrictions Weight Bearing Restrictions: No    Mobility  Bed Mobility Overal bed mobility: Modified Independent Bed Mobility: Supine to Sit     Supine to sit: Modified independent (Device/Increase time)        Transfers Overall transfer level: Needs assistance Equipment used: Rolling walker (2 wheeled) Transfers: Sit to/from Stand Sit to Stand: Supervision            Ambulation/Gait Ambulation/Gait assistance: Supervision Gait Distance (Feet): 20 Feet Assistive device: Rolling walker (2 wheeled) Gait Pattern/deviations: Step-to pattern Gait velocity: reduced Gait velocity interpretation: <1.31 ft/sec, indicative of household ambulator General Gait Details: shortened step to gait pattern with reduced gait speed, otherwise unremarkable   Stairs             Wheelchair Mobility    Modified Rankin (Stroke Patients Only)       Balance Overall balance assessment: Needs assistance Sitting-balance support: Feet supported Sitting  balance-Leahy Scale: Fair     Standing balance support: Bilateral upper extremity supported Standing balance-Leahy Scale: Fair                              Cognition Arousal/Alertness: Awake/alert Behavior During Therapy: WFL for tasks assessed/performed Overall Cognitive Status: Within Functional Limits for tasks assessed                                        Exercises General Exercises - Lower Extremity Ankle Circles/Pumps: AROM;Both;10 reps Gluteal Sets: AROM;Both;5 reps Straight Leg Raises: AROM;Both;5 reps    General Comments        Pertinent Vitals/Pain Pain Assessment: Faces Faces Pain Scale: Hurts a little bit Pain Location: legs Pain Descriptors / Indicators: Aching Pain Intervention(s): Limited activity within patient's tolerance    Home Living                      Prior Function            PT Goals (current goals can now be found in the care plan section) Acute Rehab PT Goals Patient Stated Goal: walk bettter Progress towards PT goals: Progressing toward goals    Frequency    Min 2X/week      PT Plan Current plan remains appropriate    Co-evaluation              AM-PAC PT "6 Clicks" Mobility   Outcome Measure  Help needed turning from your back to your side while in  a flat bed without using bedrails?: None Help needed moving from lying on your back to sitting on the side of a flat bed without using bedrails?: None Help needed moving to and from a bed to a chair (including a wheelchair)?: None Help needed standing up from a chair using your arms (e.g., wheelchair or bedside chair)?: None Help needed to walk in hospital room?: A Little Help needed climbing 3-5 steps with a railing? : A Lot 6 Click Score: 21    End of Session Equipment Utilized During Treatment: Gait belt Activity Tolerance: Patient limited by fatigue Patient left: in chair;with call bell/phone within reach;with chair alarm  set Nurse Communication: Mobility status PT Visit Diagnosis: Unsteadiness on feet (R26.81);Other abnormalities of gait and mobility (R26.89)     Time: 1548-1600 PT Time Calculation (min) (ACUTE ONLY): 12 min  Charges:  $Gait Training: 8-22 mins                     Zenaida Niece, PT, DPT Acute Rehabilitation Pager: [phone]    Zenaida Niece 09/23/2019, 4:09 PM

## 2019-09-23 NOTE — Plan of Care (Signed)
Will continue with plan of care. 

## 2019-09-23 NOTE — TOC Progression Note (Signed)
Transition of Care [facility]) - Progression Note    Patient Details  Name: Amanda [name] MRN: [id number] Date of Birth: 11/05/1951  Transition of Care [facility]) CM/SW [place], [place] Phone Number: 09/23/2019, 1:35 PM  Clinical Narrative:    CSW spoke with pt at bedside, pt had not made decision regarding SNF choice at that time. CSW later called to room with pt permission and spoke with pt and pt daughter. They have chosen [facility]. CSW alerted [place] with this information and will send clinicals in for [facility].    Expected Discharge Plan: [place] Barriers to Discharge: Continued Medical Work up, Ship broker  Expected Discharge Plan and Services Expected Discharge Plan: [place] arrangements for the past 2 months: Single Family Home   Social Determinants of Health (SDOH) Interventions    Readmission Risk Interventions No flowsheet data found.

## 2019-09-23 NOTE — Progress Notes (Signed)
PROGRESS NOTE    Amanda [name]  LDJ:[id number] DOB: 1951/07/11 DOA: 09/09/2019 PCP: [name], MD    Brief Narrative:  68 yo female how presented with the chief complain of acute encephalopathy.  Her initial Glasgow Coma Scale was between 7 and 11, patient was intubated for airway protection, his initial vital signs, blood pressure 207/140, temperature 101.9, heart rate 125, respiratory 22.  Patient had decorticate posturing spontaneously, not opening eyes, her lungs had diffuse rhonchi bilaterally, heart S1-S2 present tachycardic, abdomen soft, no lower extremity edema.  Patient was admitted to the hospital with the working diagnosis of metabolic encephalopathy.   Further work-up with lumbar puncture was negative for infection.  Electroencephalography negative for seizures.  Her MRI was suspicious for PRES, findings also suggestive for possible carbon monoxide poisoning, and severe hypoglycemia. EEG with severe and diffuse encephalopathy with no seizures.   Patient was liberated from mechanical ventilation on September 25.2020.  Transferred to [facility] September 14, 2019.  Patient has been recovering well, but continue to be very weak and deconditioned, now waiting for SNF placement.    Assessment & Plan:   Active Problems:   Acute encephalopathy   Acute metabolic encephalopathy   Acute on chronic renal failure (HCC)   Respiratory failure ([place])   1. Acute metabolic encephalopathy/ multifactorial- ? PRES (MRI suspicious) initially intubated but this was d/c'd 9/25 - positive generalized weakness and deconditioning -EEG with severe diffuse encephalopathy no seizure -Pending placement SNF.  2.  Rhabdomyolysis.  -Resolved, renal function stable. Patient is tolerating po well.    3. AKI on CKD stage 3, to 4.  -continue to avoid nephrotoxic medications or hypotension.   4. Hypertensive emergency -resolved -appears well controlled on amlodipine and carvedilol for  blood pressure control.   5. T2DM -ssi  6. Anemia -? CKD -trend outpatient   DVT prophylaxis: heparin  Code Status:  full Family Communication: no family at the bedside  Disposition Plan/ discharge barriers: SNF    Consultants:   Neurology   PCCM  CIR  Procedures:   LP      Subjective: Appetite good Looks at SNF beds  Objective: Vitals:   09/22/19 1603 09/22/19 2005 09/23/19 0321 09/23/19 0733  BP: (!) 143/66 (!) 115/59 126/64 138/66  Pulse: 81 75 79 90  Resp: '16 17 16 16  ' Temp: 98.1 F (36.7 C) 98.6 F (37 C) 98.8 F (37.1 C) 98.5 F (36.9 C)  TempSrc: Oral Oral Oral Oral  SpO2: 98% 95% 93% 94%  Weight:      Height:        Intake/Output Summary (Last 24 hours) at 09/23/2019 1131 Last data filed at 09/22/2019 1700 Gross per 24 hour  Intake 480 ml  Output -  Net 480 ml   Filed Weights   09/13/19 0500 09/17/19 0500 09/18/19 0500  Weight: 56.8 kg 59 kg 58.6 kg    Examination:   General: awake, pleasant and conversant Cardiovascular: rrr Pulmonary: no wheezing, clear Gastrointestinal. +BS, soft, NT     Data Reviewed: I have personally reviewed following labs and imaging studies  CBC: Recent Labs  Lab 09/19/19 0221 09/20/19 0402 09/21/19 0632 09/22/19 0451  WBC 6.4 3.0* 6.5 5.1  NEUTROABS  --   --   --  2.7  HGB 8.1* 14.5 7.8* 7.7*  HCT 25.8* 46.0 24.2* 24.4*  MCV 95.9 95.6 95.3 96.1  PLT 273 141* 244 030   Basic Metabolic Panel: Recent Labs  Lab 09/18/19 0222 09/19/19 0221  09/20/19 0402 09/21/19 0632 09/22/19 0451  NA 144 147* 145 143 142  K 3.8 3.5 3.6 3.8 4.1  CL 118* 116* 113* 112* 108  CO2 '22 24 24 24 26  ' GLUCOSE 112* 115* 130* 161* 186*  BUN 24* '23 17 17 21  ' CREATININE 1.56* 1.27* 1.15* 1.19* 1.34*  CALCIUM 7.8* 8.0* 7.9* 8.2* 8.1*  MG  --   --  1.8 1.8  --   PHOS  --   --  3.4  --   --    GFR: Estimated Creatinine Clearance: 35.2 mL/min (A) (by C-G formula based on SCr of 1.34 mg/dL (H)). Liver Function  Tests: No results for input(s): AST, ALT, ALKPHOS, BILITOT, PROT, ALBUMIN in the last 168 hours. No results for input(s): LIPASE, AMYLASE in the last 168 hours. No results for input(s): AMMONIA in the last 168 hours. Coagulation Profile: No results for input(s): INR, PROTIME in the last 168 hours. Cardiac Enzymes: No results for input(s): CKTOTAL, CKMB, CKMBINDEX, TROPONINI in the last 168 hours. BNP (last 3 results) No results for input(s): PROBNP in the last 8760 hours. HbA1C: No results for input(s): HGBA1C in the last 72 hours. CBG: Recent Labs  Lab 09/22/19 1639 09/22/19 2005 09/23/19 0028 09/23/19 0320 09/23/19 0920  GLUCAP 158* 275* 139* 124* 298*   Lipid Profile: No results for input(s): CHOL, HDL, LDLCALC, TRIG, CHOLHDL, LDLDIRECT in the last 72 hours. Thyroid Function Tests: No results for input(s): TSH, T4TOTAL, FREET4, T3FREE, THYROIDAB in the last 72 hours. Anemia Panel: No results for input(s): VITAMINB12, FOLATE, FERRITIN, TIBC, IRON, RETICCTPCT in the last 72 hours.    Radiology Studies: I have reviewed all of the imaging during this hospital visit personally     Scheduled Meds: . amLODipine  10 mg Oral Daily  . carvedilol  6.25 mg Oral BID WC  . chlorhexidine gluconate (MEDLINE KIT)  15 mL Mouth Rinse BID  . docusate  100 mg Oral Daily  . feeding supplement (ENSURE ENLIVE)  237 mL Oral BID BM  . fluticasone  1 spray Each Nare Daily  . heparin  5,000 Units Subcutaneous Q8H  . insulin aspart  0-15 Units Subcutaneous Q4H  . insulin detemir  5 Units Subcutaneous Daily  . latanoprost  1 drop Both Eyes QHS  . loratadine  10 mg Oral Daily  . pantoprazole sodium  40 mg Oral Daily   Continuous Infusions: . sodium chloride 10 mL/hr at 09/13/19 2200     LOS: 14 days     Geradine Girt, DO

## 2019-09-23 NOTE — Care Management Important Message (Signed)
Important Message  Patient Details  Name: Amanda [name] MRN: [id number] Date of Birth: 06-05-1951   Medicare Important Message Given:  Yes     Memory Argue 09/23/2019, 4:37 PM

## 2019-09-24 ENCOUNTER — Other Ambulatory Visit: Payer: Self-pay

## 2019-09-24 LAB — NOVEL CORONAVIRUS, NAA (HOSP ORDER, SEND-OUT TO REF LAB; TAT 18-24 HRS): SARS-CoV-2, NAA: NOT DETECTED

## 2019-09-24 LAB — GLUCOSE, CAPILLARY
Glucose-Capillary: 225 mg/dL — ABNORMAL HIGH (ref 70–99)
Glucose-Capillary: 243 mg/dL — ABNORMAL HIGH (ref 70–99)
Glucose-Capillary: 242 mg/dL — ABNORMAL HIGH (ref 70–99)

## 2019-09-24 MED ORDER — IRON 325 (65 FE) MG PO TABS: 325.0000 mg | ORAL_TABLET | Freq: Every day | ORAL | 0 refills | Status: DC

## 2019-09-24 MED ORDER — CARVEDILOL 6.25 MG PO TABS: 6.2500 mg | ORAL_TABLET | Freq: Two times a day (BID) | ORAL | Status: DC

## 2019-09-24 MED ORDER — INSULIN ASPART 100 UNIT/ML ~~LOC~~ SOLN: 0.0000 [IU] | Freq: Three times a day (TID) | SUBCUTANEOUS | 11 refills | Status: DC

## 2019-09-24 MED ORDER — AMLODIPINE BESYLATE 10 MG PO TABS: 10.0000 mg | ORAL_TABLET | Freq: Every day | ORAL | 0 refills | Status: DC

## 2019-09-24 MED ORDER — AMLODIPINE BESYLATE 10 MG PO TABS: 10.0000 mg | ORAL_TABLET | Freq: Every day | ORAL | Status: DC

## 2019-09-24 MED ORDER — INSULIN DETEMIR 100 UNIT/ML ~~LOC~~ SOLN: 8.0000 [IU] | Freq: Every day | SUBCUTANEOUS | 11 refills | Status: DC

## 2019-09-24 MED ORDER — INSULIN ASPART 100 UNIT/ML ~~LOC~~ SOLN
3.0000 [IU] | Freq: Three times a day (TID) | SUBCUTANEOUS | Status: DC
  Administered 2019-09-24: 3 [IU] via SUBCUTANEOUS

## 2019-09-24 MED ORDER — INSULIN ASPART 100 UNIT/ML ~~LOC~~ SOLN: 3.0000 [IU] | Freq: Three times a day (TID) | SUBCUTANEOUS | 11 refills | Status: DC

## 2019-09-24 MED ORDER — INSULIN ASPART 100 UNIT/ML ~~LOC~~ SOLN: 0.0000 [IU] | Freq: Every day | SUBCUTANEOUS | 11 refills | Status: DC

## 2019-09-24 MED ORDER — INSULIN DETEMIR 100 UNIT/ML ~~LOC~~ SOLN: 8.0000 [IU] | Freq: Every day | SUBCUTANEOUS | Status: DC

## 2019-09-24 MED ORDER — CARVEDILOL 6.25 MG PO TABS: 6.2500 mg | ORAL_TABLET | Freq: Two times a day (BID) | ORAL | 0 refills | Status: DC

## 2019-09-24 NOTE — Progress Notes (Signed)
Speech Language Pathology Treatment: Dysphagia  Patient Details Name: Amanda [name] MRN: [id number] DOB: [birth year] Today's Date: 09/24/2019 Time: 7829-5621 SLP Time Calculation (min) (ACUTE ONLY): 9 min  Assessment / Plan / Recommendation Clinical Impression  Pt ate regular solids and thin liquids with a single cough noted when pt tilted her head backward as she was swallowing thin liquids. After bringing this to her attention, she kept her head in a neutral position for the remainder of the session with no further s/s of aspiration. She believes her swallow function feels much closer to baseline. She remains afebrile and with clear lung sounds. SLP to sign off at this time, allowing pt to continue with regular solids and thin liquids although using aspiration precautions. Please reorder as needed.   HPI HPI: Pt is a 68 yo female presenting with AMS. 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 Pt required intubation 9/21-9/25. PMH: DM, HTN, HLD      SLP Plan  All goals met       Recommendations  Diet recommendations: Regular;Thin liquid Liquids provided via: Straw;Cup Medication Administration: Whole meds with puree Supervision: Patient able to self feed Compensations: Slow rate;Small sips/bites Postural Changes and/or Swallow Maneuvers: Seated upright 90 degrees                Oral Care Recommendations: Oral care BID Follow up Recommendations: None SLP Visit Diagnosis: Dysphagia, oropharyngeal phase (R13.12) Plan: All goals met       GO                Amanda [name] 09/24/2019, 10:43 AM  Amanda [name], M.A. CCC-SLP Acute Herbalist [phone] Office [phone]

## 2019-09-24 NOTE — Progress Notes (Signed)
Dr Eulogio Bear, DO was sent a secure epic text message regarding discharge planning/instructions.  The patient is out of Ozempic and has been getting samples from her PCP.  She was instructed to call tomorrow and get additional samples.  [facility] is closed presently and the daughter plans to go tomorrow morning and pick up prescriptions.  A discharge packet was printed and reviewed with the patient and her daughter.  The home equipment has been delivered.  The patient will be discharged to home with her daughter.

## 2019-09-24 NOTE — Progress Notes (Signed)
Inpatient Diabetes Program Recommendations  AACE/ADA: New Consensus Statement on Inpatient Glycemic Control   Target Ranges:  Prepandial:   less than 140 mg/dL      Peak postprandial:   less than 180 mg/dL (1-2 hours)      Critically ill patients:  140 - 180 mg/dL    Review of Glycemic Control  Diabetes history: DM2 Outpatient Diabetes medications: Ozempic 0.5 mg Qwk, Metformin 1000 mg BID Current orders for Inpatient glycemic control: Levemir 8 units daily, Novolog 0-15 units TID with meals, Novolog 0-5 units QHS  Inpatient Diabetes Program Recommendations:   Insulin-Basal: Noted Levemir increased from 5 to 8 units daily today.  Insulin-Meal Coverage: Please consider ordering Novolog 3 units TID with meals for meal coverage if patient eats at least 50% of meals.  Thanks, Barnie Alderman, RN, MSN, CDE Diabetes Coordinator Inpatient Diabetes Program [phone] (Team Pager from 8am to 5pm)

## 2019-09-24 NOTE — TOC Progression Note (Addendum)
Transition of Care [facility]) - Progression Note    Patient Details  Name: Amanda [name] MRN: [id number] Date of Birth: 1951/09/16  Transition of Care [facility]) CM/SW [place], [place] Phone Number: 09/24/2019, 3:45 PM  Clinical Narrative:    3:51pm- CSW called to room to see if pt had finished paperwork. At this time pt family had not completed paperwork and requested to speak with this Probation officer about [facility]. CSW has spoken with [facility] as CSW in team meeting. Will attempt to see pt this afternoon.  3:45pm- COVID screen has resulted negative; at current time pt does not have insurance approval. Continue to await authorization. Will follow up with pt family to get completed SNF paperwork.   Expected Discharge Plan: [place] Barriers to Discharge: Continued Medical Work up, Ship broker  Expected Discharge Plan and Services Expected Discharge Plan: [place] Choice: [place] arrangements for the past 2 months: Single Family Home Expected Discharge Date: 09/24/19                Social Determinants of Health (SDOH) Interventions    Readmission Risk Interventions Readmission Risk Prevention Plan 09/23/2019  Transportation Screening Complete  PCP or Specialist Appt within 5-7 Days Not Complete  Not Complete comments plan for SNF  Home Care Screening Complete  Medication Review (RN CM) Complete  Some recent data might be hidden

## 2019-09-24 NOTE — TOC Transition Note (Signed)
Transition of Care [facility]) - CM/SW Discharge Note   Patient Details  Name: Amanda [name] MRN: [id number] Date of Birth: January 29, 1951  Transition of Care [facility]) CM/SW Contact:  Midge Minium RN, BSN, NCM-BC, ACM-RN [phone] Phone Number: 09/24/2019, 4:19 PM   Clinical Narrative:    CM informed patient has decided not to pursue ST SNF and transition home with HH/DME with assistance provided by her daughter (daughter was present during conversation). PCP/Demo verified. CMS HH Compare and DME list provided with [employer] selected for DME; Alvis Lemmings [facility] for [facility] services. Patients daughter will provide transportation home; DME will be delivered prior to discharge. CM updated Dr. Eliseo Squires of the change in the [place]. No further needs from CM.    Final next level of care: [place] Barriers to Discharge: No Barriers Identified   Patient Goals and CMS Choice Patient states their goals for this hospitalization and ongoing recovery are:: to do things  on my own again...walk, talk and feel better CMS Medicare.gov Compare Post Acute Care list provided to:: Patient Choice offered to / list presented to : Patient   Discharge Plan and Services     Post Acute Care Choice: [place]          DME Arranged: Bedside commode, Walker rolling DME Agency: [employer] Date DME Agency Contacted: 09/24/19 Time DME Agency Contacted: [phone] Representative spoke with at DME Agency: [place]: RN, Disease Management, PT, OT, Nurse's Aide, Refused SNF, Social Work [employer] Agency: [place] Date Keifer Habib: 09/24/19 Time [place]: [place] Representative spoke with at [place]: [place] (liaison)  Social Determinants of Health ([place]) Interventions     Readmission Risk Interventions Readmission Risk Prevention Plan 09/23/2019  Transportation Screening Complete  PCP or Specialist Appt within 5-7 Days Not Complete  Not Complete comments plan for  SNF  Home Care Screening Complete  Medication Review (RN CM) Complete  Some recent data might be hidden

## 2019-09-24 NOTE — Discharge Summary (Addendum)
Physician Discharge Summary  Amanda [name] MGQ:[id number] DOB: [birth year] DOA: 09/09/2019  PCP: [name], MD  Admit date: 09/09/2019 Discharge date: 09/24/2019  Admitted From: home Discharge disposition: refused SNF-- going home   Recommendations for Outpatient Follow-Up:   1. Cbc 1 week with iron level, BMP 2. monitor blood sugars TID 3. Home health  Discharge Diagnosis:   Active Problems:   Acute encephalopathy   Acute metabolic encephalopathy   Acute on chronic renal failure (HCC)   Respiratory failure ([place])    Discharge Condition: Improved.  Diet recommendation: Low sodium, heart healthy.  Carbohydrate-modified.  Wound care: None.  Code status: Full.   History of Present Illness:   68 yo female how presented with the chief complain of acute encephalopathy.Her initial Glasgow Coma Scale was between 7 and 11, patient was intubated for airway protection, his initial vital signs, blood pressure 207/140, temperature 101.9, heart rate 125, respiratory 22. Patient haddecorticate posturing spontaneously, not opening eyes, her lungs had diffuse rhonchi bilaterally, heart S1-S2 present tachycardic, abdomen soft, no lower extremity edema.  Patient was admitted to the hospital with the working diagnosis of metabolic encephalopathy.  Further work-up with lumbar puncture was negative for infection. Electroencephalography negative for seizures. Her MRI was suspicious for PRES,findings also suggestive for possible carbon monoxide poisoning,and severe hypoglycemia.EEG with severe and diffuse encephalopathy with no seizures.   Patient was liberated from mechanical ventilation on September25.2020.  Transferred to [facility] September 14, 2019.  Patient has been recovering well, but continue to be very weak and deconditioned, now waiting for SNF placement.    Hospital Course by Problem:   1. Acute metabolic encephalopathy/ multifactorial- ? PRES (MRI  suspicious) initially intubated but this was d/c'd 9/25 - positive generalized weakness and deconditioning -EEG with severe diffuse encephalopathy no seizure -Pending placement SNF.  2.  Rhabdomyolysis.  -Resolved, renal function stable. Patient is tolerating po well.    3. AKI on CKD stage 3, to 4.  -continue to avoid nephrotoxic medications or hypotension.   4. Hypertensive emergency -resolved -appears well controlled on amlodipine and carvedilol for blood pressure control.   5. T2DM -ssi -lantus  6. Anemia -? CKD -trend outpatient    Medical Consultants:    PCCM   Discharge Exam:   Vitals:   09/24/19 0729 09/24/19 1405  BP: (!) 146/67 130/64  Pulse: 87 83  Resp: 18 16  Temp: 99.1 F (37.3 C) 98.5 F (36.9 C)  SpO2: 98% 99%   Vitals:   09/23/19 1940 09/24/19 0329 09/24/19 0729 09/24/19 1405  BP: 120/62 (!) 122/58 (!) 146/67 130/64  Pulse: 85 78 87 83  Resp: 17 17 18 16   Temp: 98.9 F (37.2 C) 98.8 F (37.1 C) 99.1 F (37.3 C) 98.5 F (36.9 C)  TempSrc: Oral Oral Oral Oral  SpO2: 96% 93% 98% 99%  Weight:      Height:        General exam: Appears calm and comfortable.   The results of significant diagnostics from this hospitalization (including imaging, microbiology, ancillary and laboratory) are listed below for reference.     Procedures and Diagnostic Studies:   Ct Abdomen Pelvis Wo Contrast  Result Date: 09/09/2019 CLINICAL DATA:  68 year old female with shortness of breath. Patient was found on the floor. EXAM: CT CHEST, ABDOMEN AND PELVIS WITHOUT CONTRAST TECHNIQUE: Multidetector CT imaging of the chest, abdomen and pelvis was performed following the standard protocol without IV contrast. COMPARISON:  CT of the abdomen  pelvis dated 12/26/2017 FINDINGS: Evaluation of this exam is limited in the absence of intravenous contrast. Evaluation is also limited due to respiratory motion artifact as well as streak artifact caused by patient's arms.  CT CHEST FINDINGS Cardiovascular: There is no cardiomegaly or pericardial effusion. There is coronary vascular calcification. The thoracic aorta and central pulmonary arteries are grossly unremarkable on this noncontrast CT. Mediastinum/Nodes: No hilar or mediastinal adenopathy. The esophagus and the thyroid gland are grossly unremarkable. No mediastinal fluid collection. Lungs/Pleura: Evaluation of the lungs is somewhat limited due to respiratory motion artifact. Focal area of increased hazy density at the left lower lobe (series 4, image 79 and coronal series 5, image 61) may be artifactual and represent respiratory motion. Developing infiltrate is not excluded. Clinical correlation is recommended. No focal consolidation, pleural effusion, or pneumothorax. Mucus secretions noted within the trachea. The central airways otherwise remain patent. Musculoskeletal: No chest wall mass or suspicious bone lesions identified. CT ABDOMEN PELVIS FINDINGS No intra-abdominal free air or free fluid. Hepatobiliary: The liver is grossly unremarkable as visualized. No intrahepatic biliary ductal dilatation. There is layering stone and sludge within the gallbladder. Evaluation of the gallbladder is otherwise very limited due to respiratory motion and streak artifact caused by patient's arms. Ultrasound is recommended for better evaluation of the gallbladder. Pancreas: The pancreas is grossly unremarkable as visualized. Spleen: Normal in size without focal abnormality. Adrenals/Urinary Tract: The adrenal glands are unremarkable. There is no hydronephrosis or nephrolithiasis on either side. Subcentimeter left renal upper pole hypodense lesions are not characterized. The visualized ureters and urinary bladder appear unremarkable. Stomach/Bowel: Mild diffuse thickened appearance of the colon likely related to underdistention and respiratory motion artifact. Mild colitis is not entirely excluded. Clinical correlation is recommended.  There is no bowel obstruction. The appendix is unremarkable as visualized. Vascular/Lymphatic: Mild aortoiliac atherosclerotic disease. The IVC is unremarkable. No portal venous gas. There is no adenopathy. Reproductive: The uterus is suboptimally visualized, hysterectomy. Other: Midline vertical anterior pelvic wall incisional scar. Musculoskeletal: Degenerative changes of the spine. No acute osseous pathology. IMPRESSION: 1. Respiratory motion artifact versus less likely developing infiltrate in the left lower lobe. Clinical correlation is recommended. 2. Artifact versus mild colitis. Clinical correlation is recommended. No bowel obstruction. 3. Cholelithiasis. Ultrasound is recommended for better evaluation of the gallbladder. Aortic Atherosclerosis (ICD10-I70.0). Electronically Signed   By: Anner Crete M.D.   On: 09/09/2019 19:31   Ct Angio Head W Or Wo Contrast  Result Date: 09/09/2019 CLINICAL DATA:  Unresponsive. EXAM: CT ANGIOGRAPHY HEAD AND NECK TECHNIQUE: Multidetector CT imaging of the head and neck was performed using the standard protocol during bolus administration of intravenous contrast. Multiplanar CT image reconstructions and MIPs were obtained to evaluate the vascular anatomy. Carotid stenosis measurements (when applicable) are obtained utilizing NASCET criteria, using the distal internal carotid diameter as the denominator. CONTRAST:  39mL OMNIPAQUE IOHEXOL 350 MG/ML SOLN COMPARISON:  CT head 09/09/2019 FINDINGS: CTA NECK FINDINGS Aortic arch: Standard branching. Imaged portion shows no evidence of aneurysm or dissection. No significant stenosis of the major arch vessel origins. Right carotid system: Heavily calcified plaque right carotid bifurcation. Right internal carotid artery lumen narrowed by 60% diameter stenosis. Moderate narrowing of the proximal right external carotid artery. Left carotid system: Mild atherosclerotic disease left carotid bulb without significant stenosis.  Vertebral arteries: Both vertebral arteries patent to the basilar. Mild stenosis at the skull base otherwise widely patent Skeleton: No acute skeletal abnormality. Ossification in the ligamentum flavum on the right at  C6-7 could represent a meningioma. This measures approximately 6 x 10 mm. Other neck: Negative for mass or adenopathy. The patient is intubated. Upper chest: Lung apices clear bilaterally. Review of the MIP images confirms the above findings CTA HEAD FINDINGS Anterior circulation: Extensive calcification in the cavernous carotid bilaterally. Moderate stenosis bilaterally. Anterior and middle cerebral arteries patent bilaterally. Mild stenosis right M1 segment. Left middle cerebral artery patent without stenosis. Anterior cerebral arteries patent bilaterally. Posterior circulation: Atherosclerotic calcification and mild stenosis in both vertebral arteries at the skull base. Both vertebral arteries patent to the basilar. PICA patent bilaterally. Basilar widely patent. Superior cerebellar and posterior cerebral arteries patent bilaterally without significant stenosis. Venous sinuses: Normal venous enhancement. Anatomic variants: None Review of the MIP images confirms the above findings IMPRESSION: 1. 60% diameter stenosis proximal right internal carotid artery due to heavily calcified plaque. Moderate stenosis right cavernous carotid. 2. Mild atherosclerotic calcification left carotid bulb without stenosis. Moderate stenosis left cavernous carotid due to calcific stenosis. 3. Mild stenosis of the vertebral artery bilaterally at the skull base otherwise clear 4. No intracranial large vessel occlusion. Mild stenosis right M1 segment. 5. 6 x 10 mm ossification ligamentum flavum on the right at C6-7 may represent meningioma. Electronically Signed   By: Franchot Gallo M.D.   On: 09/09/2019 21:15   Ct Head Wo Contrast  Result Date: 09/09/2019 CLINICAL DATA:  Altered mental status, found down EXAM: CT HEAD  WITHOUT CONTRAST TECHNIQUE: Contiguous axial images were obtained from the base of the skull through the vertex without intravenous contrast. COMPARISON:  11/27/2013 FINDINGS: Brain: No evidence of acute infarction, hemorrhage, hydrocephalus, extra-axial collection or mass lesion/mass effect. Vascular: Mild atherosclerotic calcifications involving the large vessels of the skull base. No unexpected hyperdense vessel. Skull: Normal. Negative for fracture or focal lesion. Sinuses/Orbits: No acute finding. Other: None. IMPRESSION: No acute intracranial findings. Electronically Signed   By: Davina Poke M.D.   On: 09/09/2019 19:15   Ct Angio Neck W Or Wo Contrast  Result Date: 09/09/2019 CLINICAL DATA:  Unresponsive. EXAM: CT ANGIOGRAPHY HEAD AND NECK TECHNIQUE: Multidetector CT imaging of the head and neck was performed using the standard protocol during bolus administration of intravenous contrast. Multiplanar CT image reconstructions and MIPs were obtained to evaluate the vascular anatomy. Carotid stenosis measurements (when applicable) are obtained utilizing NASCET criteria, using the distal internal carotid diameter as the denominator. CONTRAST:  31mL OMNIPAQUE IOHEXOL 350 MG/ML SOLN COMPARISON:  CT head 09/09/2019 FINDINGS: CTA NECK FINDINGS Aortic arch: Standard branching. Imaged portion shows no evidence of aneurysm or dissection. No significant stenosis of the major arch vessel origins. Right carotid system: Heavily calcified plaque right carotid bifurcation. Right internal carotid artery lumen narrowed by 60% diameter stenosis. Moderate narrowing of the proximal right external carotid artery. Left carotid system: Mild atherosclerotic disease left carotid bulb without significant stenosis. Vertebral arteries: Both vertebral arteries patent to the basilar. Mild stenosis at the skull base otherwise widely patent Skeleton: No acute skeletal abnormality. Ossification in the ligamentum flavum on the right at  C6-7 could represent a meningioma. This measures approximately 6 x 10 mm. Other neck: Negative for mass or adenopathy. The patient is intubated. Upper chest: Lung apices clear bilaterally. Review of the MIP images confirms the above findings CTA HEAD FINDINGS Anterior circulation: Extensive calcification in the cavernous carotid bilaterally. Moderate stenosis bilaterally. Anterior and middle cerebral arteries patent bilaterally. Mild stenosis right M1 segment. Left middle cerebral artery patent without stenosis. Anterior cerebral arteries patent bilaterally.  Posterior circulation: Atherosclerotic calcification and mild stenosis in both vertebral arteries at the skull base. Both vertebral arteries patent to the basilar. PICA patent bilaterally. Basilar widely patent. Superior cerebellar and posterior cerebral arteries patent bilaterally without significant stenosis. Venous sinuses: Normal venous enhancement. Anatomic variants: None Review of the MIP images confirms the above findings IMPRESSION: 1. 60% diameter stenosis proximal right internal carotid artery due to heavily calcified plaque. Moderate stenosis right cavernous carotid. 2. Mild atherosclerotic calcification left carotid bulb without stenosis. Moderate stenosis left cavernous carotid due to calcific stenosis. 3. Mild stenosis of the vertebral artery bilaterally at the skull base otherwise clear 4. No intracranial large vessel occlusion. Mild stenosis right M1 segment. 5. 6 x 10 mm ossification ligamentum flavum on the right at C6-7 may represent meningioma. Electronically Signed   By: Franchot Gallo M.D.   On: 09/09/2019 21:15   Ct Chest Wo Contrast  Result Date: 09/09/2019 CLINICAL DATA:  68 year old female with shortness of breath. Patient was found on the floor. EXAM: CT CHEST, ABDOMEN AND PELVIS WITHOUT CONTRAST TECHNIQUE: Multidetector CT imaging of the chest, abdomen and pelvis was performed following the standard protocol without IV contrast.  COMPARISON:  CT of the abdomen pelvis dated 12/26/2017 FINDINGS: Evaluation of this exam is limited in the absence of intravenous contrast. Evaluation is also limited due to respiratory motion artifact as well as streak artifact caused by patient's arms. CT CHEST FINDINGS Cardiovascular: There is no cardiomegaly or pericardial effusion. There is coronary vascular calcification. The thoracic aorta and central pulmonary arteries are grossly unremarkable on this noncontrast CT. Mediastinum/Nodes: No hilar or mediastinal adenopathy. The esophagus and the thyroid gland are grossly unremarkable. No mediastinal fluid collection. Lungs/Pleura: Evaluation of the lungs is somewhat limited due to respiratory motion artifact. Focal area of increased hazy density at the left lower lobe (series 4, image 79 and coronal series 5, image 61) may be artifactual and represent respiratory motion. Developing infiltrate is not excluded. Clinical correlation is recommended. No focal consolidation, pleural effusion, or pneumothorax. Mucus secretions noted within the trachea. The central airways otherwise remain patent. Musculoskeletal: No chest wall mass or suspicious bone lesions identified. CT ABDOMEN PELVIS FINDINGS No intra-abdominal free air or free fluid. Hepatobiliary: The liver is grossly unremarkable as visualized. No intrahepatic biliary ductal dilatation. There is layering stone and sludge within the gallbladder. Evaluation of the gallbladder is otherwise very limited due to respiratory motion and streak artifact caused by patient's arms. Ultrasound is recommended for better evaluation of the gallbladder. Pancreas: The pancreas is grossly unremarkable as visualized. Spleen: Normal in size without focal abnormality. Adrenals/Urinary Tract: The adrenal glands are unremarkable. There is no hydronephrosis or nephrolithiasis on either side. Subcentimeter left renal upper pole hypodense lesions are not characterized. The visualized  ureters and urinary bladder appear unremarkable. Stomach/Bowel: Mild diffuse thickened appearance of the colon likely related to underdistention and respiratory motion artifact. Mild colitis is not entirely excluded. Clinical correlation is recommended. There is no bowel obstruction. The appendix is unremarkable as visualized. Vascular/Lymphatic: Mild aortoiliac atherosclerotic disease. The IVC is unremarkable. No portal venous gas. There is no adenopathy. Reproductive: The uterus is suboptimally visualized, hysterectomy. Other: Midline vertical anterior pelvic wall incisional scar. Musculoskeletal: Degenerative changes of the spine. No acute osseous pathology. IMPRESSION: 1. Respiratory motion artifact versus less likely developing infiltrate in the left lower lobe. Clinical correlation is recommended. 2. Artifact versus mild colitis. Clinical correlation is recommended. No bowel obstruction. 3. Cholelithiasis. Ultrasound is recommended for better evaluation of  the gallbladder. Aortic Atherosclerosis (ICD10-I70.0). Electronically Signed   By: Anner Crete M.D.   On: 09/09/2019 19:31   Mr Brain Wo Contrast  Result Date: 09/10/2019 CLINICAL DATA:  Initial evaluation for acute encephalopathy, unclear cause. EXAM: MRI HEAD WITHOUT CONTRAST TECHNIQUE: Multiplanar, multiecho pulse sequences of the brain and surrounding structures were obtained without intravenous contrast. COMPARISON:  Prior CT and CTA from 09/09/2019. FINDINGS: Brain: Examination mildly degraded by motion artifact. Cerebral volume within normal limits for age. No significant cerebral white matter changes. Probable single tiny remote lacunar infarct noted at the posterior right internal capsule. No abnormal foci of restricted diffusion to suggest acute or subacute ischemia. Gray-white matter differentiation maintained. No encephalomalacia to suggest chronic cortical infarction. No evidence for acute or chronic intracranial hemorrhage. No mass  lesion, midline shift, or mass effect. No hydrocephalus. No extra-axial fluid collection. Normal pituitary gland. Patchy multifocal somewhat rounded foci of T2/FLAIR signal abnormality seen involving the cerebellum bilaterally, right greater than left (series 18, images 7, 6, 5). Additional focal signal abnormality seen at the left occipital pole (series 18, image 9). Possible mild involvement more superiorly at the right parieto-occipital region (series 18, image 16). No associated hemorrhage or significant mass effect. Relative sparing of the deep gray nuclei and brainstem. Vascular: Major intracranial vascular flow voids are maintained. Skull and upper cervical spine: Craniocervical junction within normal limits. Upper cervical spine normal. Bone marrow signal intensity normal. No scalp soft tissue abnormality. Sinuses/Orbits: Patient status post bilateral ocular lens replacement. Scattered mucosal thickening noted throughout the paranasal sinuses. Endotracheal and enteric tubes partially visualized, with the enteric tube partially seen coiled within the naso/oropharynx. No significant mastoid effusion. Inner ear structures grossly normal. Other: None. IMPRESSION: 1. Patchy multifocal T2/FLAIR signal abnormality involving the cerebellum bilaterally and left occipital pole. Findings are nonspecific, but could reflect sequelae of PRES. Nonspecific toxic metabolic derangement would be the primary differential consideration. Correlation with history and laboratory values recommended. 2. No other acute intracranial abnormality. Electronically Signed   By: Jeannine Boga M.D.   On: 09/10/2019 03:59   US Abdomen Limited  Result Date: 09/10/2019 CLINICAL DATA:  Gallbladder sludge EXAM: ULTRASOUND ABDOMEN LIMITED RIGHT UPPER QUADRANT COMPARISON:  Abdominal CT from yesterday FINDINGS: Gallbladder: Layering material within the gallbladder consistent with sludge. There also rounded areas measuring up to 6 mm  compatible with calculi. No wall thickening or focal tenderness, although the gallbladder is full. Common bile duct: Diameter: 6 mm.  Where visualized, no filling defect. Liver: No focal lesion identified. Within normal limits in parenchymal echogenicity. Portal vein is patent on color Doppler imaging with normal direction of blood flow towards the liver. IMPRESSION: Gallbladder sludge with probable small nonshadowing calculi. No evidence of cholecystitis or choledocholithiasis. Electronically Signed   By: Monte Fantasia M.D.   On: 09/10/2019 05:47   Dg Chest Portable 1 View  Result Date: 09/09/2019 CLINICAL DATA:  Intubation. EXAM: PORTABLE CHEST 1 VIEW COMPARISON:  Radiographs and CT earlier this day. FINDINGS: Endotracheal tube tip 5.4 cm from the carina at the thoracic inlet. The cardiomediastinal contours are normal. No definite consolidation at the left lung base. Pulmonary vasculature is normal. No confluent consolidation, pleural effusion, or pneumothorax. No acute osseous abnormalities are seen. Overlying artifact obscures the right costophrenic angle. Presumed external artifact in the right supraclavicular region. IMPRESSION: 1. Endotracheal tube tip 5.4 cm from the carina at the thoracic inlet. 2. No acute chest findings. Electronically Signed   By: Aurther Loft.D.  On: 09/09/2019 22:50   Dg Chest Port 1 View  Result Date: 09/09/2019 CLINICAL DATA:  Altered mental status, fever EXAM: PORTABLE CHEST 1 VIEW COMPARISON:  01/03/2013 FINDINGS: The heart size and mediastinal contours are within normal limits. No focal airspace consolidation. No pleural effusion. No pneumothorax. IMPRESSION: No acute cardiopulmonary findings. Electronically Signed   By: Davina Poke M.D.   On: 09/09/2019 17:10   Dg Abd Portable 1v  Result Date: 09/10/2019 CLINICAL DATA:  Status post OG tube placement. EXAM: PORTABLE ABDOMEN - 1 VIEW COMPARISON:  None. FINDINGS: OG tube is in place in good position with  both the tip and side-port in the stomach. IMPRESSION: OG tube in good position. Electronically Signed   By: Inge Rise M.D.   On: 09/10/2019 12:45   Dg Abd Portable 1v  Result Date: 09/10/2019 CLINICAL DATA:  Orogastric tube placement EXAM: PORTABLE ABDOMEN - 1 VIEW COMPARISON:  Portable exam 0754 hours compared to CT abdomen and pelvis 09/09/2019 FINDINGS: No orogastric tube identified. Excreted contrast identified within a mildly dilated RIGHT renal collecting system. Lung bases clear. Paucity of bowel gas. Bones demineralized. IMPRESSION: No orogastric tube identified - patient's nurse in ICU notified at 0829 hours. Mild collecting system dilatation of the RIGHT kidney new since prior CT. Electronically Signed   By: Lavonia Dana M.D.   On: 09/10/2019 08:30     Labs:   Basic Metabolic Panel: Recent Labs  Lab 09/18/19 0222 09/19/19 0221 09/20/19 0402 09/21/19 0632 09/22/19 0451  NA 144 147* 145 143 142  K 3.8 3.5 3.6 3.8 4.1  CL 118* 116* 113* 112* 108  CO2 22 24 24 24 26   GLUCOSE 112* 115* 130* 161* 186*  BUN 24* 23 17 17 21   CREATININE 1.56* 1.27* 1.15* 1.19* 1.34*  CALCIUM 7.8* 8.0* 7.9* 8.2* 8.1*  MG  --   --  1.8 1.8  --   PHOS  --   --  3.4  --   --    GFR Estimated Creatinine Clearance: 35.2 mL/min (A) (by C-G formula based on SCr of 1.34 mg/dL (H)). Liver Function Tests: No results for input(s): AST, ALT, ALKPHOS, BILITOT, PROT, ALBUMIN in the last 168 hours. No results for input(s): LIPASE, AMYLASE in the last 168 hours. No results for input(s): AMMONIA in the last 168 hours. Coagulation profile No results for input(s): INR, PROTIME in the last 168 hours.  CBC: Recent Labs  Lab 09/19/19 0221 09/20/19 0402 09/21/19 8756 09/22/19 0451  WBC 6.4 3.0* 6.5 5.1  NEUTROABS  --   --   --  2.7  HGB 8.1* 14.5 7.8* 7.7*  HCT 25.8* 46.0 24.2* 24.4*  MCV 95.9 95.6 95.3 96.1  PLT 273 141* 244 241   Cardiac Enzymes: No results for input(s): CKTOTAL, CKMB,  CKMBINDEX, TROPONINI in the last 168 hours. BNP: Invalid input(s): POCBNP CBG: Recent Labs  Lab 09/23/19 1212 09/23/19 1809 09/23/19 2036 09/24/19 0636 09/24/19 1157  GLUCAP 227* 264* 184* 225* 243*   D-Dimer No results for input(s): DDIMER in the last 72 hours. Hgb A1c No results for input(s): HGBA1C in the last 72 hours. Lipid Profile No results for input(s): CHOL, HDL, LDLCALC, TRIG, CHOLHDL, LDLDIRECT in the last 72 hours. Thyroid function studies No results for input(s): TSH, T4TOTAL, T3FREE, THYROIDAB in the last 72 hours.  Invalid input(s): FREET3 Anemia work up No results for input(s): VITAMINB12, FOLATE, FERRITIN, TIBC, IRON, RETICCTPCT in the last 72 hours. Microbiology Recent Results (from the past 240  hour(s))  Culture, Urine     Status: None   Collection Time: 09/15/19  8:46 AM   Specimen: Urine, Random  Result Value Ref Range Status   Specimen Description URINE, RANDOM  Final   Special Requests NONE  Final   Culture   Final    NO GROWTH Performed at [facility], [address] [place], [place] [address]    Report Status 09/16/2019 FINAL  Final  Novel Coronavirus, NAA (hospital order; send-out to ref lab)     Status: None   Collection Time: 09/23/19  4:49 PM   Specimen: Nasopharyngeal Swab; Respiratory  Result Value Ref Range Status   SARS-CoV-2, NAA NOT DETECTED NOT DETECTED Final    Comment: (NOTE) This nucleic acid amplification test was developed and its performance characteristics determined by [employer]. Nucleic acid amplification tests include PCR and TMA. This test has not been FDA cleared or approved. This test has been authorized by FDA under an Emergency Use Authorization (EUA). This test is only authorized for the duration of time the declaration that circumstances exist justifying the authorization of the emergency use of in vitro diagnostic tests for detection of SARS-CoV-2 virus and/or diagnosis of COVID-19 infection  under section 564(b)(1) of the Act, 21 U.S.C. 509TOI-7(T) (1), unless the authorization is terminated or revoked sooner. When diagnostic testing is negative, the possibility of a false negative result should be considered in the context of a patient's recent exposures and the presence of clinical signs and symptoms consistent with COVID-19. An individual without symptoms of COVID- 19 and who is not shedding SARS-CoV-2 vi rus would expect to have a negative (not detected) result in this assay. Performed At: [facility] [address] [place], [place] [id number] Rush Farmer MD JA:[id number]    [place]  Final    Comment: Performed at [facility], [place] [address] [place], [place] [address]     Discharge Instructions:   Discharge Instructions    Diet - low sodium heart healthy   Complete by: As directed    Diet Carb Modified   Complete by: As directed    Increase activity slowly   Complete by: As directed      Allergies as of 09/24/2019      Reactions   Gabapentin Other (See Comments)   Pt states medication made her feel "out of it".   Pt states medication made her feel "out of it".        Medication List    STOP taking these medications   aspirin EC 81 MG tablet   atorvastatin 40 MG tablet Commonly known as: LIPITOR   benzonatate 100 MG capsule Commonly known as: TESSALON   lisinopril 20 MG tablet Commonly known as: ZESTRIL   metFORMIN 500 MG 24 hr tablet Commonly known as: GLUCOPHAGE-XR     TAKE these medications   Accu-Chek Softclix Lancets lancets Check blood sugar 4x/day before meal an bedtime   acetaminophen 325 MG tablet Commonly known as: TYLENOL Take 2 tablets (650 mg total) by mouth every 6 (six) hours as needed (or Fever >/= 101).   amLODipine 10 MG tablet Commonly known as: NORVASC Take 1 tablet (10 mg total) by mouth daily. Start taking on: September 25, 2019   Capsaicin-Menthol-Methyl Sal 0.025-1-12  % Crea Commonly known as: capsaicin-methyl sal-menthol Apply 1 application topically as needed. Apply over painful areas   carvedilol 6.25 MG tablet Commonly known as: COREG Take 1 tablet (6.25 mg total) by  mouth 2 (two) times daily with a meal.   cetirizine 5 MG tablet Commonly known as: ZYRTEC Take 1 tablet (5 mg total) by mouth daily.   fluticasone 50 MCG/ACT nasal spray Commonly known as: FLONASE Place 2 sprays into both nostrils daily.   FreeStyle Libre 14 Day Sensor Misc 1 each by Does not apply route 4 (four) times daily.   glucose blood test strip Commonly known as: Accu-Chek Aviva Plus Check blood sugar 4x/day before all meals and bedtime   Iron 325 (65 Fe) MG Tabs Take 1 tablet (325 mg total) by mouth daily.   latanoprost 0.005 % ophthalmic solution Commonly known as: XALATAN 1 drop at bedtime.   OZEMPIC (0.25 OR 0.5 MG/DOSE) [place] Inject 0.25 mg into the skin. Inject 0.25mg  weekly for four weeks then increase to 0.5mg  weekly   Vitamin D3 50 MCG (2000 UT) capsule Take by mouth daily.   Womens 50+ Multi Vitamin/Min Tabs Take 1 tablet by mouth daily.            Durable Medical Equipment  (From admission, onward)         Start     Ordered   09/24/19 1615  For home use only DME Walker rolling  Once    Question:  Patient needs a walker to treat with the following condition  Answer:  Physical deconditioning   09/24/19 1615   09/24/19 1615  For home use only DME Bedside commode  Once    Question:  Patient needs a bedside commode to treat with the following condition  Answer:  Physical deconditioning   09/24/19 1615          Contact information for follow-up providers    [name], MD Follow up in 1 week(s).   Specialty: Internal Medicine Contact information: [place] [address] [phone]        [facility] Follow up.   Why: bedside commode and rolling walker Contact information: [place]  [place] [address] [phone]        Care, [facility] Follow up.   Specialty: Home Health Services Why: Home Health Registered Nurse, Physical and Occupational Therapy, Social Worker and [facility] information: [place] [address] [phone]            Contact information for after-discharge care    Destination    [facility] .   Service: Skilled Nursing Contact information: [address] [place] [phone]                   Time coordinating discharge: 35 min  Signed:  Geradine Girt DO  [facility] 09/24/2019, 4:28 PM

## 2019-09-27 ENCOUNTER — Telehealth: Payer: Self-pay | Admitting: Pharmacist

## 2019-09-27 ENCOUNTER — Other Ambulatory Visit: Payer: Self-pay | Admitting: Pharmacy Technician

## 2019-09-27 DIAGNOSIS — E118 Type 2 diabetes mellitus with unspecified complications: Secondary | ICD-10-CM

## 2019-09-27 DIAGNOSIS — Z794 Long term (current) use of insulin: Secondary | ICD-10-CM

## 2019-09-27 MED ORDER — OZEMPIC (0.25 OR 0.5 MG/DOSE) 2 MG/1.5ML ~~LOC~~ SOPN: 0.2500 mg | PEN_INJECTOR | SUBCUTANEOUS | 11 refills | Status: DC

## 2019-09-27 NOTE — Patient Outreach (Signed)
[facility]) Care Management  09/27/2019  NANDINI BOGDANSKI Mar 15, 1951 [id number]  Successful outreach call placed to patient in regards to [place] application with [employer].  Spoke to patient's daughter, Chanell. HIPAA identifiers verified x 2.  Patient's daughter informed that Ms. Gillott had recently been discharged from the hospital this week. She informed she has not seen an application from [facility] for [employer] thru [employer]. She also informed her mother has not mentioned an application to her. However, her mother has mentioned that a Dr. Maudie Mercury at Internal medicine has been helping her but she was unsure of the extent except to say she has gotten some samples. Patient's daughter inquired if another application could be provided and she expressed that she hoped no one was messing with her mother's mail.  Informed patient's daughter that I would be in the office on Tuesday and would mail another application.  Sent an inbasket to [name] Mannie Stabile to inquire if they were pursuing patient assistance as well. Will await a return apply.  Plan:Mail patient a 4th application on Tuesday 61/51/8343 and will followup in 5-7 business days from 10/01/2019.  Kadie Balestrieri P. Kesley Gaffey, [place] Management [phone]

## 2019-09-27 NOTE — Progress Notes (Signed)
Patient requests help accessing Ozempic. Refill sent to [place], offered patient clinic samples as well. Patient states she will try to afford at [facility] and if not, will be in clinic Monday for samples.

## 2019-09-30 ENCOUNTER — Other Ambulatory Visit: Payer: Self-pay | Admitting: Pharmacist

## 2019-09-30 ENCOUNTER — Ambulatory Visit: Payer: Self-pay | Admitting: Pharmacist

## 2019-09-30 ENCOUNTER — Ambulatory Visit (INDEPENDENT_AMBULATORY_CARE_PROVIDER_SITE_OTHER): Payer: Medicare Other | Admitting: Internal Medicine

## 2019-09-30 ENCOUNTER — Encounter: Payer: Self-pay | Admitting: Internal Medicine

## 2019-09-30 ENCOUNTER — Other Ambulatory Visit: Payer: Self-pay

## 2019-09-30 VITALS — BP 148/70 | HR 83 | Temp 98.6°F | Ht 61.0 in | Wt 129.6 lb

## 2019-09-30 DIAGNOSIS — D649 Anemia, unspecified: Secondary | ICD-10-CM

## 2019-09-30 DIAGNOSIS — Z8669 Personal history of other diseases of the nervous system and sense organs: Secondary | ICD-10-CM

## 2019-09-30 DIAGNOSIS — E118 Type 2 diabetes mellitus with unspecified complications: Secondary | ICD-10-CM

## 2019-09-30 DIAGNOSIS — Z794 Long term (current) use of insulin: Secondary | ICD-10-CM | POA: Diagnosis not present

## 2019-09-30 DIAGNOSIS — Z7984 Long term (current) use of oral hypoglycemic drugs: Secondary | ICD-10-CM

## 2019-09-30 DIAGNOSIS — I10 Essential (primary) hypertension: Secondary | ICD-10-CM | POA: Diagnosis not present

## 2019-09-30 DIAGNOSIS — E119 Type 2 diabetes mellitus without complications: Secondary | ICD-10-CM | POA: Diagnosis not present

## 2019-09-30 DIAGNOSIS — Z79899 Other long term (current) drug therapy: Secondary | ICD-10-CM

## 2019-09-30 LAB — GLUCOSE, CAPILLARY: Glucose-Capillary: 207 mg/dL — ABNORMAL HIGH (ref 70–99)

## 2019-09-30 MED ORDER — METFORMIN HCL ER 500 MG PO TB24: 500.0000 mg | ORAL_TABLET | Freq: Every day | ORAL | 0 refills | Status: DC

## 2019-09-30 MED ORDER — BLOOD GLUCOSE MONITOR KIT: PACK | 0 refills | Status: AC

## 2019-09-30 MED ORDER — LISINOPRIL 20 MG PO TABS: 20.0000 mg | ORAL_TABLET | Freq: Every day | ORAL | 0 refills | Status: DC

## 2019-09-30 MED FILL — LISINOPRIL 20 MG TABLET: 20 | 90 days supply | Qty: 90 | Fill #0

## 2019-09-30 MED FILL — metFORMIN HCL ER 500 MG TB2: 500 | 90 days supply | Qty: 90 | Fill #0

## 2019-09-30 NOTE — Patient Outreach (Signed)
[facility]) Care Management  09/30/2019  Amanda [name] 1951-01-07 [id number]   Patient was called to follow up on medication assistance and for post discharge medication review.Unfortunately, she did not answer either number listed in her phone. Voicemails were left on both numbers.  Patient is working with Flossie Dibble, [name] to obtain [place] will most likely close patient's case if no issues with her post discharge medication review.  Plan: Send patient an unsuccessful outreach letter. Call patient back in 3-5 business days.  [name], [name], [place] Clinical Pharmacist [phone]

## 2019-09-30 NOTE — Patient Instructions (Addendum)
Ms. Ballinas we will restart your lisinopril at 20mg  daily.  We will also start you back on your metformin.  I have prescribed you a new glucometer. Please follow up in one month

## 2019-09-30 NOTE — Progress Notes (Signed)
CC: HFU encephalopathy, T2DM, HTN, normocytic anemia  HPI:  [name] is a 68 y.o. female with PMH below.  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 Allergy    SEASONAL  . Anemia   . Arthritis   . Cataract    PRESENT IN LEFT/ CATARACTS REMOVED FROM RIGHT  . Diabetes mellitus 1996  . DKA (diabetic ketoacidoses) ([place]) 01/02/2013  . Hyperlipidemia   . Hypertension   . Hypotension   . Influenza A (H1N1) 01/03/2013  . Neuromuscular disorder ([place])    NEUROPATHY  . SYNCOPE 05/10/2010   Qualifier: Diagnosis of  By: Marilynne Halsted, RN, BSN, Jacquelyn     Review of Systems:  ROS: Pulmonary: pt denies increased work of breathing, shortness of breath,  Cardiac: pt denies palpitations, chest pain,  Abdominal: pt denies abdominal pain, nausea, vomiting, or diarrhea   Physical Exam:  Vitals:   09/30/19 1405 09/30/19 1523  BP: (!) 160/76 (!) 148/70  Pulse: 83   Temp: 98.6 F (37 C)   TempSrc: Oral   SpO2: 100%   Weight: 129 lb 9.6 oz (58.8 kg)   Height: 5\' 1"  (1.549 m)    Cardiac:  normal rate and rhythm, clear s1 and s2, no murmurs, rubs or gallops, Trace LE edema bilateral Pulmonary: CTAB, not in distress Psych: Alert, conversant, in good spirits   Social History   Socioeconomic History  . Marital status: Married    Spouse name: Not on file  . Number of children: 1  . Years of education: Not on file  . Highest education level: Not on file  Occupational History  . Occupation: retired  Scientific laboratory technician  . Financial resource strain: Not on file  . Food insecurity    Worry: Not on file    Inability: Not on file  . Transportation needs    Medical: Not on file    Non-medical: Not on file  Tobacco Use  . Smoking status: Never Smoker  . Smokeless tobacco: Never Used  Substance and Sexual Activity  . Alcohol use: No   Alcohol/week: 0.0 standard drinks  . Drug use: No  . Sexual activity: Not on file  Lifestyle  . Physical activity    Days per week: Not on file    Minutes per session: Not on file  . Stress: Not on file  Relationships  . Social Herbalist on phone: Not on file    Gets together: Not on file    Attends religious service: Not on file    Active member of club or organization: Not on file    Attends meetings of clubs or organizations: Not on file    Relationship status: Not on file  . Intimate partner violence    Fear of current or ex partner: Not on file    Emotionally abused: Not on file    Physically abused: Not on file    Forced sexual activity: Not on file  Other Topics Concern  . Not on file  Social History Narrative  . Not on file    Family History  Problem Relation Age of Onset  . Diabetes Mother   . Hypertension Mother   . Breast cancer Mother   . Aneurysm Father   . Diabetes Sister   . Hypertension Sister   . Kidney disease Sister  One Kidney transplant, one sister on HD  . Breast cancer Sister   . Colon cancer Neg Hx   . Esophageal cancer Neg Hx     Assessment & Plan:   See Encounters Tab for problem based charting.  Patient discussed with Dr. Evette Doffing

## 2019-10-01 LAB — MICROALBUMIN / CREATININE URINE RATIO
Creatinine, Urine: 46.5 mg/dL
Microalb/Creat Ratio: 1241 mg/g creat — ABNORMAL HIGH (ref 0–29)
Microalbumin, Urine: 577.1 ug/mL

## 2019-10-01 LAB — CBC
Hematocrit: 28.2 % — ABNORMAL LOW (ref 34.0–46.6)
Hemoglobin: 8.9 g/dL — ABNORMAL LOW (ref 11.1–15.9)
MCH: 29.9 pg (ref 26.6–33.0)
MCHC: 31.6 g/dL (ref 31.5–35.7)
MCV: 95 fL (ref 79–97)
Platelets: 400 10*3/uL (ref 150–450)
RBC: 2.98 x10E6/uL — ABNORMAL LOW (ref 3.77–5.28)
RDW: 13 % (ref 11.7–15.4)
WBC: 3.8 10*3/uL (ref 3.4–10.8)

## 2019-10-01 LAB — FERRITIN: Ferritin: 124 ng/mL (ref 15–150)

## 2019-10-01 LAB — IRON AND TIBC
Iron Saturation: 25 % (ref 15–55)
Iron: 56 ug/dL (ref 27–139)
Total Iron Binding Capacity: 224 ug/dL — ABNORMAL LOW (ref 250–450)
UIBC: 168 ug/dL (ref 118–369)

## 2019-10-01 NOTE — Progress Notes (Signed)
[facility] Medicine Clinic Attending  Case discussed with Dr. [name]  at the time of the visit.  We reviewed the resident's history and exam and pertinent patient test results.  I agree with the assessment, diagnosis, and plan of care documented in the resident's note.  

## 2019-10-01 NOTE — Assessment & Plan Note (Addendum)
Recent admission for encephalopathy with unknown cause.  She was hyperglycemic on presentation.   Almost all outpatient medications d/ced.  She cannot find her glucometer.  She started back on her ozempic since hospital discharge. Her cbg here today is 25.    -gave her a prescription for glucometer strips and lancets and asked that she checks her cbg at least twice a day until she returns in one month. -start back metformin at 500mg  daily

## 2019-10-01 NOTE — Assessment & Plan Note (Signed)
She has had several changes to her regimen over the last year due to having to balance her bp control with her orthostatic hypotension.  On ED presentation she was severely hypertensive and her MRI showed signs of possible PRES.  During hospitalization she was stabilized on amlodipine 10mg  and carvedilol 6.25mg .  Still hypertensive today.  She has some mile lower extremity edema possibly from the amlodipine.   -continue amlodipine and carvedilol -add back lisinopril 20mg 

## 2019-10-01 NOTE — Assessment & Plan Note (Signed)
Previously pt was iron deficient in the past.  Was anemic during hospitalization.  Repeated cbc and iron studies today hgb 8.9, iron stores are replete.    -next visit will check LDH, reticulocyte count

## 2019-10-04 ENCOUNTER — Telehealth: Payer: Self-pay

## 2019-10-04 NOTE — Telephone Encounter (Signed)
Bhavin with [facility] requesting VO for PT. Please call back.

## 2019-10-04 NOTE — Telephone Encounter (Signed)
Requesting lab results, please call pt back.  

## 2019-10-07 ENCOUNTER — Ambulatory Visit: Payer: Self-pay | Admitting: Pharmacist

## 2019-10-07 ENCOUNTER — Other Ambulatory Visit: Payer: Self-pay | Admitting: Pharmacist

## 2019-10-07 NOTE — Patient Outreach (Signed)
[facility]) Care Management  10/07/2019  ETHNE JEON 14-Aug-1951 [id number]   Called patient, no answer. HIPAA compliant message left on her phone. Patient's case will be closed as Mannie Stabile, [name] is handling her patient assistance.  Plan: Send closure letter to patient and provider.  [name], [name], [place] Clinical Pharmacist [phone]

## 2019-10-07 NOTE — Telephone Encounter (Signed)
Called Bhavin,PT with [facility] - stated pt was discharged last week from the hospital; he saw pt / eval done. Verbal order given for "PT once a week for 2 weeks; twice a week x 3 weeks;onece a week x 3 weeks  to help pt with balance and lower body strengthen". If not appropriate, let me know   Thanks

## 2019-10-07 NOTE — Telephone Encounter (Signed)
She will definitely benefit from PT thanks

## 2019-10-07 NOTE — Telephone Encounter (Signed)
Went over lab results with patient hgb, iron studies, urine microalbumin.  She is doing well.  Going to get new bp cuff and meter this week.

## 2019-10-11 ENCOUNTER — Telehealth: Payer: Self-pay | Admitting: Internal Medicine

## 2019-10-11 MED FILL — ACCU-CHEK SOFTCLIX LANCETS: 50 days supply | Qty: 200 | Fill #0

## 2019-10-11 MED FILL — ACCU-CHEK AVIVA PLUS W/DEVI: W/DEVICE | 30 days supply | Qty: 1 | Fill #0

## 2019-10-11 MED FILL — ACCU-CHEK AVIVA PLUS STRP: 50 days supply | Qty: 200 | Fill #0

## 2019-10-11 NOTE — Telephone Encounter (Signed)
Thanks

## 2019-10-11 NOTE — Telephone Encounter (Signed)
Bhavin PT from [facility] - stated pt is doing very well so he will be changing how often he will see her to  " Once a week x 2 weeks".

## 2019-10-11 NOTE — Telephone Encounter (Signed)
Bhavin need VO [phone]

## 2019-10-12 ENCOUNTER — Encounter: Payer: Self-pay | Admitting: Internal Medicine

## 2019-10-12 ENCOUNTER — Telehealth: Payer: Self-pay | Admitting: Internal Medicine

## 2019-10-12 NOTE — Telephone Encounter (Signed)
   Reason for call:   I received a call from [name] at 2 PM indicating she was having increase glucose to 270.    Pertinent Data:   She was recently admitted for AMS. At discharge type II diabetes medications were held at discharge. She has continued taking her ozempic. She was previously on metformin xr 1000 mg bid. During her last clinic visit on 10/12 her metformin was restarted at 500 mg qd. She hasn't eaten today due to worrying about her glucose. She denies nausea, vomiting, changes in urination.    Assessment / Plan / Recommendations:   Resume metformin 1000 xr mg in the am, 500 mg xr in the evening.   Follow-up with clinic Monday for telehealth visit  Cont. To check glucose QID.   As always, pt is advised that if symptoms worsen or new symptoms arise, they should go to an urgent care facility or to to ER for further evaluation.   Amanda [name] A, DO   10/12/2019, 8:50 PM

## 2019-10-16 ENCOUNTER — Telehealth: Payer: Self-pay | Admitting: *Deleted

## 2019-10-16 ENCOUNTER — Other Ambulatory Visit: Payer: Self-pay

## 2019-10-16 NOTE — Patient Outreach (Signed)
[facility]) Care Management  10/16/2019  VANETTA RULE 11-02-1951 [id number]   Medication Adherence call to [name] [place] Identifiers Verify spoke with patient she is past due on Atorvastatin 40 mg,patient is not sure if she is to be taking this medication,patient ask to call doctors office,spoke with Bonnita Nasuti doctors nurse she said medication has been discontinued since 09/21/19,patient is no longer taking this medication call patient back and left a message for patient to call back. Mrs. Stange is showing past due under [place].   [place] Management Direct Dial [phone]  Fax [phone] [email or web address]

## 2019-10-16 NOTE — Telephone Encounter (Signed)
[facility] rep calls to confirm atorvastatin has been removed from pt's daily meds, this is confirmed as 09/24/2019

## 2019-10-21 ENCOUNTER — Ambulatory Visit: Payer: Medicare Other | Admitting: Podiatry

## 2019-10-28 ENCOUNTER — Ambulatory Visit: Payer: Medicare Other | Admitting: Pharmacist

## 2019-10-28 NOTE — Telephone Encounter (Signed)
Error

## 2019-11-11 ENCOUNTER — Other Ambulatory Visit: Payer: Self-pay

## 2019-11-11 ENCOUNTER — Encounter: Payer: Self-pay | Admitting: Internal Medicine

## 2019-11-11 ENCOUNTER — Other Ambulatory Visit: Payer: Self-pay | Admitting: *Deleted

## 2019-11-11 ENCOUNTER — Ambulatory Visit (INDEPENDENT_AMBULATORY_CARE_PROVIDER_SITE_OTHER): Payer: Medicare Other | Admitting: Internal Medicine

## 2019-11-11 VITALS — BP 130/76 | HR 86 | Temp 97.7°F | Ht 61.0 in | Wt 116.5 lb

## 2019-11-11 DIAGNOSIS — E118 Type 2 diabetes mellitus with unspecified complications: Secondary | ICD-10-CM | POA: Diagnosis not present

## 2019-11-11 DIAGNOSIS — E785 Hyperlipidemia, unspecified: Secondary | ICD-10-CM | POA: Diagnosis not present

## 2019-11-11 DIAGNOSIS — Z7984 Long term (current) use of oral hypoglycemic drugs: Secondary | ICD-10-CM

## 2019-11-11 DIAGNOSIS — D649 Anemia, unspecified: Secondary | ICD-10-CM | POA: Diagnosis not present

## 2019-11-11 DIAGNOSIS — Z23 Encounter for immunization: Secondary | ICD-10-CM | POA: Diagnosis not present

## 2019-11-11 DIAGNOSIS — I10 Essential (primary) hypertension: Secondary | ICD-10-CM | POA: Diagnosis not present

## 2019-11-11 DIAGNOSIS — E113513 Type 2 diabetes mellitus with proliferative diabetic retinopathy with macular edema, bilateral: Secondary | ICD-10-CM

## 2019-11-11 DIAGNOSIS — Z794 Long term (current) use of insulin: Secondary | ICD-10-CM | POA: Diagnosis not present

## 2019-11-11 DIAGNOSIS — Z79899 Other long term (current) drug therapy: Secondary | ICD-10-CM

## 2019-11-11 LAB — GLUCOSE, CAPILLARY: Glucose-Capillary: 81 mg/dL (ref 70–99)

## 2019-11-11 MED ORDER — AMLODIPINE BESYLATE 5 MG PO TABS: 5.0000 mg | ORAL_TABLET | Freq: Every day | ORAL | 0 refills | Status: DC

## 2019-11-11 MED ORDER — ATORVASTATIN CALCIUM 40 MG PO TABS: 40.0000 mg | ORAL_TABLET | Freq: Every day | ORAL | 1 refills | Status: DC

## 2019-11-11 MED ORDER — METFORMIN HCL ER 500 MG PO TB24: 1000.0000 mg | ORAL_TABLET | Freq: Two times a day (BID) | ORAL | 1 refills | Status: DC

## 2019-11-11 MED FILL — METFORMIN HCL ER 500 MG TB2: 500 | 90 days supply | Qty: 360 | Fill #0

## 2019-11-11 MED FILL — ATORVASTATIN 40 MG TABLET: 40 | 90 days supply | Qty: 90 | Fill #0

## 2019-11-11 MED FILL — AMLODIPINE BESYLATE 5 MG TA: 5 | 90 days supply | Qty: 90 | Fill #0

## 2019-11-11 NOTE — Patient Instructions (Addendum)
Ms. Bently we will stop your carvedilol.  Start you back on amlodipine 5mg  daily.  Continue taking your lisinopril 20mg  daily.  Continue your metformin and ozempic.  I will also check your blood count today and let you know the results.

## 2019-11-11 NOTE — Progress Notes (Signed)
CC: HTN, HLD, T2DM follow up  HPI:  [name] is a 68 y.o. female with PMH below.  Today we will address HTN, HLD, T2DM follow up  Please see A&P for status of the patient's chronic medical conditions  Past Medical History:  Diagnosis Date  . Allergy    SEASONAL  . Anemia   . Arthritis   . Cataract    PRESENT IN LEFT/ CATARACTS REMOVED FROM RIGHT  . Diabetes mellitus 1996  . DKA (diabetic ketoacidoses) ([place]) 01/02/2013  . Hyperlipidemia   . Hypertension   . Hypotension   . Influenza A (H1N1) 01/03/2013  . Neuromuscular disorder ([place])    NEUROPATHY  . SYNCOPE 05/10/2010   Qualifier: Diagnosis of  By: Marilynne Halsted, RN, BSN, Jacquelyn     Review of Systems:  ROS: Pulmonary: pt denies increased work of breathing, shortness of breath,  Cardiac: pt denies palpitations, chest pain,  Abdominal: pt denies abdominal pain, nausea, vomiting, or diarrhea  Physical Exam:  Vitals:   11/11/19 1437  BP: 130/76  Pulse: 86  Temp: 97.7 F (36.5 C)  TempSrc: Oral  SpO2: 100%  Weight: 116 lb 8 oz (52.8 kg)  Height: 5\' 1"  (1.549 m)   Cardiac: JVD flat, normal rate and rhythm, clear s1 and s2, no murmurs, rubs or gallops, no LE edema Pulmonary: CTAB, not in distress Abdominal: non distended abdomen, soft and nontender Psych: Alert, conversant, in good spirits   Social History   Socioeconomic History  . Marital status: Married    Spouse name: Not on file  . Number of children: 1  . Years of education: Not on file  . Highest education level: Not on file  Occupational History  . Occupation: retired  Scientific laboratory technician  . Financial resource strain: Not on file  . Food insecurity    Worry: Not on file    Inability: Not on file  . Transportation needs    Medical: Not on file    Non-medical: Not on file  Tobacco Use  . Smoking status: Never Smoker  . Smokeless tobacco: Never Used  Substance and Sexual Activity  . Alcohol use: No    Alcohol/week: 0.0 standard drinks  . Drug  use: No  . Sexual activity: Not on file  Lifestyle  . Physical activity    Days per week: Not on file    Minutes per session: Not on file  . Stress: Not on file  Relationships  . Social Herbalist on phone: Not on file    Gets together: Not on file    Attends religious service: Not on file    Active member of club or organization: Not on file    Attends meetings of clubs or organizations: Not on file    Relationship status: Not on file  . Intimate partner violence    Fear of current or ex partner: Not on file    Emotionally abused: Not on file    Physically abused: Not on file    Forced sexual activity: Not on file  Other Topics Concern  . Not on file  Social History Narrative  . Not on file    Family History  Problem Relation Age of Onset  . Diabetes Mother   . Hypertension Mother   . Breast cancer Mother   . Aneurysm Father   . Diabetes Sister   . Hypertension Sister   . Kidney disease Sister        One Kidney  transplant, one sister on HD  . Breast cancer Sister   . Colon cancer Neg Hx   . Esophageal cancer Neg Hx     Assessment & Plan:   See Encounters Tab for problem based charting.  Patient discussed with Dr. Lynnae January

## 2019-11-12 LAB — CBC
Hematocrit: 35.1 % (ref 34.0–46.6)
Hemoglobin: 11.1 g/dL (ref 11.1–15.9)
MCH: 29.3 pg (ref 26.6–33.0)
MCHC: 31.6 g/dL (ref 31.5–35.7)
MCV: 93 fL (ref 79–97)
Platelets: 311 10*3/uL (ref 150–450)
RBC: 3.79 x10E6/uL (ref 3.77–5.28)
RDW: 12.6 % (ref 11.7–15.4)
WBC: 5.1 10*3/uL (ref 3.4–10.8)

## 2019-11-12 LAB — BMP8+ANION GAP
Anion Gap: 15 mmol/L (ref 10.0–18.0)
BUN/Creatinine Ratio: 28 (ref 12–28)
BUN: 35 mg/dL — ABNORMAL HIGH (ref 8–27)
CO2: 18 mmol/L — ABNORMAL LOW (ref 20–29)
Calcium: 9.5 mg/dL (ref 8.7–10.3)
Chloride: 108 mmol/L — ABNORMAL HIGH (ref 96–106)
Creatinine, Ser: 1.27 mg/dL — ABNORMAL HIGH (ref 0.57–1.00)
GFR calc Af Amer: 50 mL/min/{1.73_m2} — ABNORMAL LOW (ref >59)
GFR calc non Af Amer: 44 mL/min/{1.73_m2} — ABNORMAL LOW (ref >59)
Glucose: 69 mg/dL (ref 65–99)
Potassium: 5.6 mmol/L — ABNORMAL HIGH (ref 3.5–5.2)
Sodium: 141 mmol/L (ref 134–144)

## 2019-11-12 NOTE — Assessment & Plan Note (Deleted)
Currently supposed to be on amlodipine 10mg , lisinopril 20mg , carvedilol 6.25mg  bid however has been out of amlodipine and carvedilol for 1-2 weeks.  BP today is actually close to optimal.  Repeated bmp today K slightly elevated.    -will call pt reduce lisinopril dose slightly and increase amlodipine to 10mg 

## 2019-11-12 NOTE — Assessment & Plan Note (Addendum)
T2DM: on ozempic and metformin.  cbg at home running better.  111 this morning.  No values in the 200 really no values at around 180.  Currently taking 1000mg  in am and 500mg  pm of metformin.   -continue ozempic 0.25mg  and metformin increase to 1000mg  BID -next visit if A1C lowered significantly which I expect it will may be able to d/c ozempic

## 2019-11-12 NOTE — Assessment & Plan Note (Signed)
Currently supposed to be on amlodipine 10mg , lisinopril 20mg , carvedilol 6.25mg  bid however has been out of amlodipine and carvedilol for 1-2 weeks.  BP today is actually close to optimal.  Repeated bmp today K slightly elevated.    -will call pt reduce lisinopril dose slightly and increase amlodipine to 10mg 

## 2019-11-12 NOTE — Assessment & Plan Note (Addendum)
T2DM: on ozempic and metformin.  cbg at home running better.  111 this morning.  No values in the 200 really no values at around 180.  Currently taking 1000mg  in am and 500mg  pm of metformin.   -continue ozempic 0.25mg  and metformin increase to 1000mg  BID

## 2019-11-12 NOTE — Assessment & Plan Note (Signed)
HLD: start back atorvastatin 40mg  all meds stopped on hospital discharge.

## 2019-11-12 NOTE — Assessment & Plan Note (Signed)
Last visit checked iron studies which showed adequate iron stores.  Checked hgb today which has recovered nicely since her hospitalization.    -continue periodic monitoring of iron stores and hgb

## 2019-11-14 NOTE — Progress Notes (Signed)
[facility] Medicine Clinic Attending  Case discussed with Dr. [name]  at the time of the visit.  We reviewed the resident's history and exam and pertinent patient test results.  I agree with the assessment, diagnosis, and plan of care documented in the resident's note.  

## 2019-11-15 ENCOUNTER — Telehealth: Payer: Self-pay | Admitting: Internal Medicine

## 2019-11-15 DIAGNOSIS — I10 Essential (primary) hypertension: Secondary | ICD-10-CM

## 2019-11-15 MED ORDER — LISINOPRIL 10 MG PO TABS: 10.0000 mg | ORAL_TABLET | Freq: Every day | ORAL | 0 refills | Status: DC

## 2019-11-15 NOTE — Telephone Encounter (Signed)
Spoke with pt she will continue amlodipine 5mg  and decrease lisinopril to 10mg  daily based on how her bp has been running at home.  She will monitor bp on lisinopril 10 and amlodipine 5mg .  She will come in for repeat labs after one week to recheck potassium level.

## 2019-12-04 ENCOUNTER — Telehealth: Payer: Self-pay | Admitting: Internal Medicine

## 2019-12-04 NOTE — Telephone Encounter (Signed)
Spoke with pt she will pick up 10mg  prescription from pharmacy.  If bp continues to run low we can stop her amlodipine completely.  Discussed with pt once on correct dosage she will monitor bp closely and bring log in to lab visit early next week.

## 2019-12-04 NOTE — Telephone Encounter (Signed)
I'm confused as to why she's doing that I told her I sent a new prescription for 10mg  lisinopril our last phone conversation and that she would need to come in for repeat lab work.  I will call her.

## 2019-12-04 NOTE — Telephone Encounter (Signed)
Patient returned call. States she's been taking amlodipine 5 mg daily and lisinopril 20 mg every other day. Lisinopril is not scored, patient does not have a pill cutter, and when she tried cutting the tab with a knife it fell apart. Home BPs PRIOR to taking AM BP meds have been: 96/67, 84/58, 114/79. Heart rate: 93, 110, 72, 89. States she has been having intermittent "slight" headaches and lightheadedness. Also c/o "trouble with my sinuses." Discussed using Flonase 2 sprays each nostril daily and nasal saline lavage at other times during the day. Also discussed transitioning slowly from lying/reclining to sitting before standing to prevent lightheadedness/fall. Will forward to PCP to advise. [name], BSN, RN-BC

## 2019-12-04 NOTE — Telephone Encounter (Signed)
Lab staff made triage aware that patient has not come in for repeat BMP. VM left at both numbers requesting return call. Will need to schedule lab only appt. [name], BSN, RN-BC

## 2019-12-09 ENCOUNTER — Telehealth: Payer: Self-pay | Admitting: Internal Medicine

## 2019-12-09 NOTE — Telephone Encounter (Signed)
Pt is requesting callback regarding labs needed, [phone]

## 2019-12-09 NOTE — Telephone Encounter (Signed)
Talked to Amanda [name] - stated Dr Shan Levans wants her to schedule a lab appt this week. Amanda [name] prefers to come tomorrow - lab appt scheduled 12/22 @ 1000 AM.

## 2019-12-10 ENCOUNTER — Other Ambulatory Visit (INDEPENDENT_AMBULATORY_CARE_PROVIDER_SITE_OTHER): Payer: Medicare Other

## 2019-12-10 ENCOUNTER — Other Ambulatory Visit: Payer: Self-pay

## 2019-12-10 ENCOUNTER — Telehealth: Payer: Self-pay | Admitting: *Deleted

## 2019-12-10 ENCOUNTER — Other Ambulatory Visit: Payer: Medicare Other

## 2019-12-10 DIAGNOSIS — I10 Essential (primary) hypertension: Secondary | ICD-10-CM | POA: Diagnosis not present

## 2019-12-10 LAB — BASIC METABOLIC PANEL
Anion gap: 6 (ref 5–15)
BUN: 26 mg/dL — ABNORMAL HIGH (ref 8–23)
CO2: 23 mmol/L (ref 22–32)
Calcium: 9.3 mg/dL (ref 8.9–10.3)
Chloride: 114 mmol/L — ABNORMAL HIGH (ref 98–111)
Creatinine, Ser: 1.32 mg/dL — ABNORMAL HIGH (ref 0.44–1.00)
GFR calc Af Amer: 48 mL/min — ABNORMAL LOW (ref >60)
GFR calc non Af Amer: 42 mL/min — ABNORMAL LOW (ref >60)
Glucose, Bld: 116 mg/dL — ABNORMAL HIGH (ref 70–99)
Potassium: 6 mmol/L — ABNORMAL HIGH (ref 3.5–5.1)
Sodium: 143 mmol/L (ref 135–145)

## 2019-12-10 NOTE — Addendum Note (Signed)
Addended by: Truddie Crumble on: 12/10/2019 03:56 PM   Modules accepted: Orders

## 2019-12-10 NOTE — Telephone Encounter (Signed)
Called labs to dr Shan Levans, K+6 VO Stop lisinopril Increase PO fluids Appt/ lab repeat 12/24/2019 at 1015 Entered stat bmpw/ anion gap at appt

## 2019-12-18 ENCOUNTER — Other Ambulatory Visit: Payer: Self-pay | Admitting: Hematology and Oncology

## 2019-12-18 ENCOUNTER — Other Ambulatory Visit: Payer: Self-pay | Admitting: Internal Medicine

## 2019-12-18 DIAGNOSIS — Z1231 Encounter for screening mammogram for malignant neoplasm of breast: Secondary | ICD-10-CM

## 2019-12-24 ENCOUNTER — Encounter: Payer: Self-pay | Admitting: Internal Medicine

## 2019-12-24 ENCOUNTER — Other Ambulatory Visit: Payer: Self-pay

## 2019-12-24 ENCOUNTER — Ambulatory Visit (INDEPENDENT_AMBULATORY_CARE_PROVIDER_SITE_OTHER): Payer: Medicare Other | Admitting: Internal Medicine

## 2019-12-24 VITALS — BP 110/75 | HR 81 | Temp 97.8°F | Ht 61.0 in | Wt 113.5 lb

## 2019-12-24 DIAGNOSIS — E119 Type 2 diabetes mellitus without complications: Secondary | ICD-10-CM

## 2019-12-24 DIAGNOSIS — Z794 Long term (current) use of insulin: Secondary | ICD-10-CM

## 2019-12-24 DIAGNOSIS — Z79899 Other long term (current) drug therapy: Secondary | ICD-10-CM

## 2019-12-24 DIAGNOSIS — Z7984 Long term (current) use of oral hypoglycemic drugs: Secondary | ICD-10-CM

## 2019-12-24 DIAGNOSIS — R197 Diarrhea, unspecified: Secondary | ICD-10-CM | POA: Diagnosis not present

## 2019-12-24 DIAGNOSIS — I10 Essential (primary) hypertension: Secondary | ICD-10-CM

## 2019-12-24 DIAGNOSIS — E118 Type 2 diabetes mellitus with unspecified complications: Secondary | ICD-10-CM

## 2019-12-24 DIAGNOSIS — R42 Dizziness and giddiness: Secondary | ICD-10-CM | POA: Diagnosis not present

## 2019-12-24 DIAGNOSIS — E875 Hyperkalemia: Secondary | ICD-10-CM | POA: Insufficient documentation

## 2019-12-24 LAB — COMPREHENSIVE METABOLIC PANEL
ALT: 39 U/L (ref 0–44)
AST: 39 U/L (ref 15–41)
Albumin: 3.8 g/dL (ref 3.5–5.0)
Alkaline Phosphatase: 49 U/L (ref 38–126)
Anion gap: 9 (ref 5–15)
BUN: 26 mg/dL — ABNORMAL HIGH (ref 8–23)
CO2: 23 mmol/L (ref 22–32)
Calcium: 9.1 mg/dL (ref 8.9–10.3)
Chloride: 109 mmol/L (ref 98–111)
Creatinine, Ser: 1.19 mg/dL — ABNORMAL HIGH (ref 0.44–1.00)
GFR calc Af Amer: 55 mL/min — ABNORMAL LOW (ref >60)
GFR calc non Af Amer: 47 mL/min — ABNORMAL LOW (ref >60)
Glucose, Bld: 122 mg/dL — ABNORMAL HIGH (ref 70–99)
Potassium: 5 mmol/L (ref 3.5–5.1)
Sodium: 141 mmol/L (ref 135–145)
Total Bilirubin: 0.5 mg/dL (ref 0.3–1.2)
Total Protein: 6.6 g/dL (ref 6.5–8.1)

## 2019-12-24 LAB — POCT GLYCOSYLATED HEMOGLOBIN (HGB A1C): Hemoglobin A1C: 7.6 % — AB (ref 4.0–5.6)

## 2019-12-24 LAB — GLUCOSE, CAPILLARY: Glucose-Capillary: 109 mg/dL — ABNORMAL HIGH (ref 70–99)

## 2019-12-24 NOTE — Progress Notes (Signed)
   CC: hyperkalemia  HPI:  [name] is a 69 y.o. with PMH as below.   Please see A&P for assessment of the patient's acute and chronic medical conditions.   She has been having diarrhea off and on for about three years or more. She'll usually go 3-4 times in a rush. This happens about once a month. She has lost about 20 lbs in the past two years and 10 lbs in the past year. No palpitations although she sometimes has flushing with sweating. This does not occur very often and she is unsure if it's when she is moving around or not. It takes about 2-3 days to recover when this occurs and she just feels like she's moving slow. Dizziness with diarrhea only. She'll have abdominal tenderness with this as well which lasts for several days. Her stool is dark since she started iron and is a looser stool generally, sometimes formed.   Past Medical History:  Diagnosis Date  . Allergy    SEASONAL  . Anemia   . Arthritis   . Cataract    PRESENT IN LEFT/ CATARACTS REMOVED FROM RIGHT  . Diabetes mellitus 1996  . DKA (diabetic ketoacidoses) ([place]) 01/02/2013  . Hyperlipidemia   . Hypertension   . Hypotension   . Influenza A (H1N1) 01/03/2013  . Neuromuscular disorder ([place])    NEUROPATHY  . SYNCOPE 05/10/2010   Qualifier: Diagnosis of  By: Marilynne Halsted, RN, BSN, Jacquelyn     Review of Systems:   10 point ROS negative except as noted in HPI  Physical Exam:   Constitution: NAD, appears stated age Cardio: RRR, no m/r/g, no LE edema  Respiratory: CTA, no w/r/r Abdominal: NTTP, soft, non-distended, +BS MSK: moving all extremities Neuro: normal affect, a&ox3, pleasant  Skin: c/d/i, no rash   Vitals:   12/24/19 1019  BP: 110/75  Pulse: 81  Temp: 97.8 F (36.6 C)  TempSrc: Oral  SpO2: 100%  Weight: 113 lb 8 oz (51.5 kg)  Height: 5\' 1"  (1.549 m)     Assessment & Plan:   See Encounters Tab for problem based charting.  Patient discussed with Dr. Philipp Ovens

## 2019-12-24 NOTE — Assessment & Plan Note (Signed)
Potassium 11/23 5.6 and lisinopril was reduced. Repeat K 12/21 6. Lisinopril was stopped.   - repeat CMP today

## 2019-12-24 NOTE — Assessment & Plan Note (Signed)
Extensive previous work-up negative with recent colonoscopy wnl as well as abd CT in September without acute findings. Thought to possibly be IBS or diabetic colopathy.  - keep food diary, also keep track of when flushing occurs - continue citrucel fiber per GI physician  - if symptoms continue, consider work-up for pheo or carcinoid with flushing, hypertensive episodes, and intermittent diarrhea

## 2019-12-24 NOTE — Assessment & Plan Note (Signed)
A1c due today. Last a1c 10.9 in September. Current medications include ozempic .25mg  and metforming 1000 mg bid. A1c today 7.6  - cont. Current medications.  - f/u 3 months a1c

## 2019-12-24 NOTE — Progress Notes (Signed)
Left voicemail for patient to return call to clinic and no concerns with current lab results.

## 2019-12-24 NOTE — Assessment & Plan Note (Signed)
BP Readings from Last 3 Encounters:  12/24/19 110/75  11/11/19 130/76  09/30/19 (!) 148/70   Blood pressure controlled on norvasc 5 mg qd. She checks bp at home and this has been mostly within normal limits with a couple of episodes of bp systolic up to 244. She stopped lisinopril for hyperkalemia and has not been taking carvedilol. She thinks she was told to stop but is not sure. No history of heart failure, will continue to hold as she is normotensive today.   - cont. norvasc 5 mg qd  - cont. To hold coreg and lisinopril - cont. To measure bp - f/u cmp

## 2019-12-24 NOTE — Patient Instructions (Addendum)
Thank you for allowing [place] to provide your care today. Today we discussed **  I have ordered the following labs for you:  Complete metabolic panel    Please buy a blood pressure cuff and check your blood pressure once a day.   Keep food journal or download Myfitness pal to keep track of foods to help understand what may be causing intermittent diarrhea.  Please also keep track of how often you are having episodes of sweating and flushing    Please follow-up in three months for hemoglobin a1c and blood pressure check. Please bring journal and blood pressure readings with you.    Please call the internal medicine center clinic if you have any questions or concerns, we may be able to help and keep you from a long and expensive emergency room wait. Our clinic and after hours phone number is [phone], the best time to call is Monday through Friday 9 am to 4 pm but there is always someone available 24/7 if you have an emergency. If you need medication refills please notify your pharmacy one week in advance and they will send [place] a request.

## 2020-01-02 NOTE — Progress Notes (Signed)
Internal Medicine Clinic Attending  Case discussed with Dr. Seawell at the time of the visit.  We reviewed the resident's history and exam and pertinent patient test results.  I agree with the assessment, diagnosis, and plan of care documented in the resident's note.    

## 2020-01-17 DIAGNOSIS — H40053 Ocular hypertension, bilateral: Secondary | ICD-10-CM | POA: Diagnosis not present

## 2020-01-17 DIAGNOSIS — H4312 Vitreous hemorrhage, left eye: Secondary | ICD-10-CM | POA: Diagnosis not present

## 2020-01-17 DIAGNOSIS — E113513 Type 2 diabetes mellitus with proliferative diabetic retinopathy with macular edema, bilateral: Secondary | ICD-10-CM | POA: Diagnosis not present

## 2020-01-17 DIAGNOSIS — Z794 Long term (current) use of insulin: Secondary | ICD-10-CM | POA: Diagnosis not present

## 2020-01-17 DIAGNOSIS — H26491 Other secondary cataract, right eye: Secondary | ICD-10-CM | POA: Diagnosis not present

## 2020-01-17 LAB — HM DIABETES EYE EXAM

## 2020-01-22 ENCOUNTER — Other Ambulatory Visit: Payer: Self-pay

## 2020-01-22 ENCOUNTER — Ambulatory Visit: Payer: Medicare Other | Admitting: Podiatry

## 2020-01-22 ENCOUNTER — Encounter: Payer: Self-pay | Admitting: Podiatry

## 2020-01-22 DIAGNOSIS — E119 Type 2 diabetes mellitus without complications: Secondary | ICD-10-CM

## 2020-01-22 DIAGNOSIS — M79674 Pain in right toe(s): Secondary | ICD-10-CM | POA: Diagnosis not present

## 2020-01-22 DIAGNOSIS — M79675 Pain in left toe(s): Secondary | ICD-10-CM | POA: Diagnosis not present

## 2020-01-22 DIAGNOSIS — B351 Tinea unguium: Secondary | ICD-10-CM

## 2020-01-22 DIAGNOSIS — M2011 Hallux valgus (acquired), right foot: Secondary | ICD-10-CM | POA: Diagnosis not present

## 2020-01-22 DIAGNOSIS — M2012 Hallux valgus (acquired), left foot: Secondary | ICD-10-CM

## 2020-01-22 NOTE — Patient Instructions (Signed)
Diabetes Mellitus and Foot Care Foot care is an important part of your health, especially when you have diabetes. Diabetes may cause you to have problems because of poor blood flow (circulation) to your feet and legs, which can cause your skin to:  Become thinner and drier.  Break more easily.  Heal more slowly.  Peel and crack. You may also have nerve damage (neuropathy) in your legs and feet, causing decreased feeling in them. This means that you may not notice minor injuries to your feet that could lead to more serious problems. Noticing and addressing any potential problems early is the best way to prevent future foot problems. How to care for your feet Foot hygiene  Wash your feet daily with warm water and mild soap. Do not use hot water. Then, pat your feet and the areas between your toes until they are completely dry. Do not soak your feet as this can dry your skin.  Trim your toenails straight across. Do not dig under them or around the cuticle. File the edges of your nails with an emery board or nail file.  Apply a moisturizing lotion or petroleum jelly to the skin on your feet and to dry, brittle toenails. Use lotion that does not contain alcohol and is unscented. Do not apply lotion between your toes. Shoes and socks  Wear clean socks or stockings every day. Make sure they are not too tight. Do not wear knee-high stockings since they may decrease blood flow to your legs.  Wear shoes that fit properly and have enough cushioning. Always look in your shoes before you put them on to be sure there are no objects inside.  To break in new shoes, wear them for just a few hours a day. This prevents injuries on your feet. Wounds, scrapes, corns, and calluses  Check your feet daily for blisters, cuts, bruises, sores, and redness. If you cannot see the bottom of your feet, use a mirror or ask someone for help.  Do not cut corns or calluses or try to remove them with medicine.  If you  find a minor scrape, cut, or break in the skin on your feet, keep it and the skin around it clean and dry. You may clean these areas with mild soap and water. Do not clean the area with peroxide, alcohol, or iodine.  If you have a wound, scrape, corn, or callus on your foot, look at it several times a day to make sure it is healing and not infected. Check for: ? Redness, swelling, or pain. ? Fluid or blood. ? Warmth. ? Pus or a bad smell. General instructions  Do not cross your legs. This may decrease blood flow to your feet.  Do not use heating pads or hot water bottles on your feet. They may burn your skin. If you have lost feeling in your feet or legs, you may not know this is happening until it is too late.  Protect your feet from hot and cold by wearing shoes, such as at the beach or on hot pavement.  Schedule a complete foot exam at least once a year (annually) or more often if you have foot problems. If you have foot problems, report any cuts, sores, or bruises to your health care provider immediately. Contact a health care provider if:  You have a medical condition that increases your risk of infection and you have any cuts, sores, or bruises on your feet.  You have an injury that is not   healing.  You have redness on your legs or feet.  You feel burning or tingling in your legs or feet.  You have pain or cramps in your legs and feet.  Your legs or feet are numb.  Your feet always feel cold.  You have pain around a toenail. Get help right away if:  You have a wound, scrape, corn, or callus on your foot and: ? You have pain, swelling, or redness that gets worse. ? You have fluid or blood coming from the wound, scrape, corn, or callus. ? Your wound, scrape, corn, or callus feels warm to the touch. ? You have pus or a bad smell coming from the wound, scrape, corn, or callus. ? You have a fever. ? You have a red line going up your leg. Summary  Check your feet every day  for cuts, sores, red spots, swelling, and blisters.  Moisturize feet and legs daily.  Wear shoes that fit properly and have enough cushioning.  If you have foot problems, report any cuts, sores, or bruises to your health care provider immediately.  Schedule a complete foot exam at least once a year (annually) or more often if you have foot problems. This information is not intended to replace advice given to you by your health care provider. Make sure you discuss any questions you have with your health care provider. Document Revised: 08/28/2019 Document Reviewed: 01/06/2017 Elsevier Patient Education  2020 Elsevier Inc.  

## 2020-01-24 ENCOUNTER — Encounter: Payer: Self-pay | Admitting: Dietician

## 2020-01-26 NOTE — Progress Notes (Signed)
Subjective: Amanda [name] presents today for follow up of preventative diabetic foot care and painful mycotic nails b/l that are difficult to trim. Pain interferes with ambulation. Aggravating factors include wearing enclosed shoe gear. Pain is relieved with periodic professional debridement.   Amanda [name] voices no new pedal complaints on today's visit.   Allergies  Allergen Reactions  . Gabapentin Other (See Comments)    Pt states medication made her feel "out of it".   Pt states medication made her feel "out of it".       Objective: There were no vitals filed for this visit.  Vascular Examination:  Capillary fill time to digits <3s b/l, palpable DP pulses b/l, palpable PT pulses b/l, pedal hair present b/l and skin temperature gradient within normal limits b/l  Dermatological Examination: Pedal skin with normal turgor, texture and tone bilaterally and toenails 1-5 b/l elongated, dystrophic, thickened, crumbly with subungual debris  Musculoskeletal: Normal muscle strength 5/5 to all lower extremity muscle groups bilaterally, no pain crepitus or joint limitation noted with ROM b/l and bunion deformity noted b/l.  Neurological: Protective sensation intact 5/5 intact bilaterally with 10g monofilament b/l and vibratory sensation intact b/l  Assessment: Pain due to onychomycosis of toenails of both feet  Encounter for diabetic foot exam ([place])  Hallux valgus, acquired, bilateral  Controlled type 2 diabetes mellitus without complication, without long-term current use of insulin ([place])  Plan: Diabetic foot examination performed on today's visit. -Continue diabetic foot care principles. Literature dispensed on today.  -Toenails 1-5 b/l were debrided in length and girth without iatrogenic bleeding. -Patient to continue soft, supportive shoe gear daily. -Patient to continue soft, supportive shoe gear daily. She is interested in diabetic shoes. Start procedure for diabetic shoes.  Patient qualifies based on diagnoses -Patient to report any pedal injuries to medical professional immediately. -Patient/POA to call should there be question/concern in the interim.  Return in about 3 months (around 04/20/2020) for diabetic nail trim.

## 2020-01-29 ENCOUNTER — Ambulatory Visit: Payer: Medicare Other

## 2020-02-07 ENCOUNTER — Other Ambulatory Visit: Payer: Self-pay | Admitting: Internal Medicine

## 2020-02-07 ENCOUNTER — Telehealth: Payer: Self-pay | Admitting: Internal Medicine

## 2020-02-07 DIAGNOSIS — Z794 Long term (current) use of insulin: Secondary | ICD-10-CM

## 2020-02-07 DIAGNOSIS — E118 Type 2 diabetes mellitus with unspecified complications: Secondary | ICD-10-CM

## 2020-02-07 MED FILL — ACCU-CHEK SOFTCLIX LANCETS: 50 days supply | Qty: 200 | Fill #0

## 2020-02-07 MED FILL — AMLODIPINE BESYLATE 5 MG TA: 5 | 90 days supply | Qty: 90 | Fill #0

## 2020-02-07 MED FILL — ACCU-CHEK AVIVA PLUS STRP: 50 days supply | Qty: 200 | Fill #0

## 2020-02-07 NOTE — Telephone Encounter (Signed)
Need refills on amLODipine (NORVASC) 5 MG tablet glucose blood (ACCU-CHEK AVIVA PLUS) test strip ;pt contact [place], [place] - [address] [facility].

## 2020-02-07 NOTE — Telephone Encounter (Signed)
I am actually on vacation next week.  Also pt should only take one or the other trulicity or ozempic.  Currently on ozempic

## 2020-02-07 NOTE — Telephone Encounter (Signed)
Pt requesting a call back from a nurse [phone]

## 2020-02-07 NOTE — Telephone Encounter (Signed)
Returned call to patient. Requesting sample of trulicity and ozempic. She has appt on Monday 02/09/2019 with PCP and can pick up samples at that time. [name], BSN, RN-BC

## 2020-02-10 ENCOUNTER — Other Ambulatory Visit: Payer: Self-pay

## 2020-02-10 ENCOUNTER — Ambulatory Visit: Payer: Medicare Other | Admitting: Orthotics

## 2020-02-10 ENCOUNTER — Ambulatory Visit: Payer: Medicare Other

## 2020-02-10 DIAGNOSIS — E119 Type 2 diabetes mellitus without complications: Secondary | ICD-10-CM

## 2020-02-10 DIAGNOSIS — B351 Tinea unguium: Secondary | ICD-10-CM

## 2020-02-10 DIAGNOSIS — M79674 Pain in right toe(s): Secondary | ICD-10-CM

## 2020-02-10 DIAGNOSIS — M2011 Hallux valgus (acquired), right foot: Secondary | ICD-10-CM

## 2020-02-10 MED FILL — ATORVASTATIN 40 MG TABLET: 40 | 90 days supply | Qty: 90 | Fill #1

## 2020-02-10 NOTE — Progress Notes (Signed)

## 2020-02-10 NOTE — Telephone Encounter (Signed)
Pls contact regarding samples [phone]

## 2020-02-10 NOTE — Telephone Encounter (Signed)
Called pt - stated she is on Ozempic 0.5 mg. Stated she can pick up sample before lunch.

## 2020-02-10 NOTE — Telephone Encounter (Signed)
Sample x 1 (lot# [id number]) given to pt by Dr Lynnae January, Attending.

## 2020-02-10 NOTE — Telephone Encounter (Signed)
Done

## 2020-02-21 MED FILL — METFORMIN HCL ER 500 MG TB2: 500 | 90 days supply | Qty: 360 | Fill #1

## 2020-03-05 ENCOUNTER — Other Ambulatory Visit: Payer: Self-pay

## 2020-03-05 ENCOUNTER — Ambulatory Visit
Admission: RE | Admit: 2020-03-05 | Discharge: 2020-03-05 | Disposition: A | Discharge status: Home / Self Care | Payer: Medicare Other | Source: Ambulatory Visit | Attending: *Deleted | Admitting: *Deleted

## 2020-03-05 DIAGNOSIS — Z1231 Encounter for screening mammogram for malignant neoplasm of breast: Secondary | ICD-10-CM

## 2020-03-12 ENCOUNTER — Telehealth: Payer: Self-pay

## 2020-03-12 DIAGNOSIS — E118 Type 2 diabetes mellitus with unspecified complications: Secondary | ICD-10-CM

## 2020-03-12 NOTE — Telephone Encounter (Signed)
Requesting a sample on Semaglutide,0.25 or 0.5MG /DOS, (OZEMPIC, 0.25 OR 0.5 MG/DOSE,) 2 MG/1.5ML SOPN. Please call pt back.

## 2020-03-13 DIAGNOSIS — H524 Presbyopia: Secondary | ICD-10-CM | POA: Diagnosis not present

## 2020-03-13 DIAGNOSIS — H5203 Hypermetropia, bilateral: Secondary | ICD-10-CM | POA: Diagnosis not present

## 2020-03-13 MED ORDER — OZEMPIC (0.25 OR 0.5 MG/DOSE) 2 MG/1.5ML ~~LOC~~ SOPN: 0.5000 mg | PEN_INJECTOR | SUBCUTANEOUS | 1 refills | Status: DC

## 2020-03-13 NOTE — Telephone Encounter (Signed)
Updated prescription to reflect correct dose, 0.5 mg weekly which she had been up titrated to.

## 2020-03-13 NOTE — Telephone Encounter (Addendum)
Patient returned call. States she cannot afford this medication. Made aware that we have sample to give her today and to expect a call from [facility] for help obtaining this med going forward. She was very Patent attorney. She is aware that our office closes at noon today. States she will be here before that. [name], BSN, RN-BC   Ozempic 2 mg/1.5 mL [id number] Exp: 12/2021  Sample of Ozempic handed to patient by Dr. Philipp Ovens.

## 2020-03-13 NOTE — Telephone Encounter (Signed)
Returned call to patient. No answer at either number. VM messages left on both requesting return call.  We do have sample of Ozempic to give patient. Patient requested sample last month as well. Will route to [facility] Pharmacist for patient assistance with obtaining Ozempic. [name], BSN, RN-BC

## 2020-03-23 ENCOUNTER — Encounter: Payer: Self-pay | Admitting: Internal Medicine

## 2020-03-23 ENCOUNTER — Ambulatory Visit (INDEPENDENT_AMBULATORY_CARE_PROVIDER_SITE_OTHER): Payer: Medicare Other | Admitting: Internal Medicine

## 2020-03-23 VITALS — BP 157/86 | HR 93 | Temp 98.6°F | Wt 116.1 lb

## 2020-03-23 DIAGNOSIS — E118 Type 2 diabetes mellitus with unspecified complications: Secondary | ICD-10-CM | POA: Diagnosis not present

## 2020-03-23 DIAGNOSIS — Z7984 Long term (current) use of oral hypoglycemic drugs: Secondary | ICD-10-CM

## 2020-03-23 DIAGNOSIS — Z79899 Other long term (current) drug therapy: Secondary | ICD-10-CM | POA: Diagnosis not present

## 2020-03-23 DIAGNOSIS — E119 Type 2 diabetes mellitus without complications: Secondary | ICD-10-CM

## 2020-03-23 DIAGNOSIS — R197 Diarrhea, unspecified: Secondary | ICD-10-CM

## 2020-03-23 DIAGNOSIS — I10 Essential (primary) hypertension: Secondary | ICD-10-CM

## 2020-03-23 DIAGNOSIS — Z794 Long term (current) use of insulin: Secondary | ICD-10-CM

## 2020-03-23 LAB — POCT GLYCOSYLATED HEMOGLOBIN (HGB A1C): Hemoglobin A1C: 6.3 % — AB (ref 4.0–5.6)

## 2020-03-23 LAB — GLUCOSE, CAPILLARY: Glucose-Capillary: 87 mg/dL (ref 70–99)

## 2020-03-23 NOTE — Telephone Encounter (Signed)
Pt here for follow up visit with pcp and states she has not heard from [facility] and requests assistance with obtaining ozempic.  Will route message to [facility] care management team for assistance.[name] KitchenMarland KitchenDespina Hidden Cassady4/5/20214:46 PM  (please see previous messages)

## 2020-03-23 NOTE — Patient Instructions (Addendum)
Start taking your fiber at night before going to bed.  Please be sure to check your blood pressure throughout the day for the next several days and call me with your results on Thursday as we discussed.  Please begin taking your metformin with meals and be sure to let me know if you have any low blood sugars at home so we can adjust your diabetes medications.  For now we will not change any of your medications.

## 2020-03-23 NOTE — Progress Notes (Signed)
CC: HTN, T2DM follow up  HPI:  Amanda [name] is a 69 y.o. female with PMH below.  Today we will address HTN, T2DM follow up  Please see A&P for status of the patient's chronic medical conditions  Past Medical History:  Diagnosis Date  . Allergy    SEASONAL  . Anemia   . Arthritis   . Cataract    PRESENT IN LEFT/ CATARACTS REMOVED FROM RIGHT  . Diabetes mellitus 1996  . DKA (diabetic ketoacidoses) ([place]) 01/02/2013  . Hyperlipidemia   . Hypertension   . Hypotension   . Influenza A (H1N1) 01/03/2013  . Neuromuscular disorder ([place])    NEUROPATHY  . SYNCOPE 05/10/2010   Qualifier: Diagnosis of  By: Marilynne Halsted, RN, BSN, Jacquelyn     Review of Systems:  ROS: Pulmonary: pt denies increased work of breathing, shortness of breath,  Cardiac: pt denies palpitations, chest pain,  Abdominal: pt denies abdominal pain, nausea, vomiting    Physical Exam:  Vitals:   03/23/20 1554  BP: (!) 157/86  Pulse: 93  Temp: 98.6 F (37 C)  TempSrc: Oral  SpO2: 100%  Weight: 116 lb 1.6 oz (52.7 kg)   Cardiac: JVD flat, normal rate and rhythm, clear s1 and s2, no murmurs, rubs or gallops, no LE edema Pulmonary: CTAB, not in distress Abdominal: non distended abdomen, soft and nontender Psych: Alert, conversant, in good spirits   Social History   Socioeconomic History  . Marital status: Married    Spouse name: Not on file  . Number of children: 1  . Years of education: Not on file  . Highest education level: Not on file  Occupational History  . Occupation: retired  Tobacco Use  . Smoking status: Never Smoker  . Smokeless tobacco: Never Used  Substance and Sexual Activity  . Alcohol use: No    Alcohol/week: 0.0 standard drinks  . Drug use: No  . Sexual activity: Not on file  Other Topics Concern  . Not on file  Social History Narrative  . Not on file   Social Determinants of Health   Financial Resource Strain:   . Difficulty of Paying Living Expenses:   Food Insecurity:    . Worried About Charity fundraiser in the Last Year:   . Arboriculturist in the Last Year:   Transportation Needs:   . Film/video editor (Medical):   [name] Lack of Transportation (Non-Medical):   Physical Activity:   . Days of Exercise per Week:   . Minutes of Exercise per Session:   Stress:   . Feeling of Stress :   Social Connections:   . Frequency of Communication with Friends and Family:   . Frequency of Social Gatherings with Friends and Family:   . Attends Religious Services:   . Active Member of Clubs or Organizations:   . Attends Archivist Meetings:   [name] Marital Status:   Intimate Partner Violence:   . Fear of Current or Ex-Partner:   . Emotionally Abused:   [name] Physically Abused:   . Sexually Abused:     Family History  Problem Relation Age of Onset  . Diabetes Mother   . Hypertension Mother   . Breast cancer Mother   . Aneurysm Father   . Diabetes Sister   . Hypertension Sister   . Kidney disease Sister        One Kidney transplant, one sister on HD  . Breast cancer Sister   .  Colon cancer Neg Hx   . Esophageal cancer Neg Hx     Assessment & Plan:   See Encounters Tab for problem based charting.  Patient discussed with Dr. Lynnae January

## 2020-03-24 ENCOUNTER — Ambulatory Visit: Payer: Self-pay | Admitting: *Deleted

## 2020-03-24 ENCOUNTER — Encounter: Payer: Self-pay | Admitting: Internal Medicine

## 2020-03-24 NOTE — Assessment & Plan Note (Addendum)
Lab Results  Component Value Date   HGBA1C 6.3 (A) 03/23/2020    A1C improved, she brought her meter, no signs of low cbg values.  Currently on ozempic 0.5 weekly and extended release metformin 1000 BID.  She has lost weight but she has a normal BMI and does not appear overly thin or cachectic.  She is up to date on cancer screening and does not have symptoms.  She wants to avoid insulin at all costs.    -we will continue to monitor may be able to decrease ozempic dosing, continue current regimen for now -ccm referral for ozempic affordability

## 2020-03-24 NOTE — Chronic Care Management (AMB) (Signed)
  Chronic Care Management   Note  03/24/2020 Name: MIKENZI RAYSOR MRN: [id number] DOB: 1951/09/07  Central Pharmacy Referral made for help with Ozempic medication assistance.   Follow up plan: Central Pharmacy team to follow up.   [place] Management Coordinator Direct Dial:  [phone]  Fax: [phone]

## 2020-03-24 NOTE — Assessment & Plan Note (Addendum)
At home bp runs 109/65, 127/72 usually lower, BP up compared to usual.  She is only taking amlodipine 5mg  currently.    -she will keep a log of home bp and call me Thursday and if necessary I will increase dosage vs add carvedilol back -she will bring her home bp machine in for calibration, she forgot to bring it this visit

## 2020-03-24 NOTE — Assessment & Plan Note (Signed)
Diarrhea, goes anywhere from 4-9 times.  She has had this for many years, she has been followed by GI for this and had colonoscopy with biopsies which were negative for microscopic colitis.  recommended fiber as she has had improvement with this.    -She takes immodium for bad days but she is worried to do this too often because it can change from loose stools to constipation.  She takes citrucel as well which seems to help which we can increase.   -she will begin taking metformin with her meals, she is already on the extended release

## 2020-03-25 ENCOUNTER — Other Ambulatory Visit: Payer: Self-pay | Admitting: Pharmacist

## 2020-03-25 ENCOUNTER — Ambulatory Visit: Payer: Self-pay | Admitting: Pharmacist

## 2020-03-25 NOTE — Progress Notes (Signed)
[facility] Medicine Clinic Attending  Case discussed with Dr. [name]  at the time of the visit.  We reviewed the resident's history and exam and pertinent patient test results.  I agree with the assessment, diagnosis, and plan of care documented in the resident's note.  

## 2020-03-25 NOTE — Patient Outreach (Signed)
[place] [facility]) Care Management  03/25/2020  Amanda [name] March 31, 1951 [id number]   Called patient on both her mobile and home numbers. Unfortunately, she did not answer her phone. HIPAA compliant message was left on her voicemail.    Medication:  Ozempic  Plan: Call the patient back in 3-5 business days.   [name], [name], [place] Clinical Pharmacist [phone]

## 2020-03-30 ENCOUNTER — Ambulatory Visit: Payer: Self-pay | Admitting: *Deleted

## 2020-03-30 ENCOUNTER — Other Ambulatory Visit: Payer: Self-pay | Admitting: Pharmacist

## 2020-03-30 ENCOUNTER — Encounter: Payer: Self-pay | Admitting: Pharmacist

## 2020-03-30 ENCOUNTER — Other Ambulatory Visit: Payer: Self-pay | Admitting: Pharmacy Technician

## 2020-03-30 DIAGNOSIS — I10 Essential (primary) hypertension: Secondary | ICD-10-CM

## 2020-03-30 DIAGNOSIS — E118 Type 2 diabetes mellitus with unspecified complications: Secondary | ICD-10-CM

## 2020-03-30 DIAGNOSIS — Z794 Long term (current) use of insulin: Secondary | ICD-10-CM

## 2020-03-30 NOTE — Patient Outreach (Signed)
[place] [facility]) Care Management  03/30/2020  AUSTRALIA DROLL [birth year] [id number]                                       Medication Assistance Referral  Referral From: [place]  Medication/Company: [employer] / [employer] Patient application portion:  Education officer, museum portion: Faxed  to Dr. Guadlupe Spanish Provider address/fax verified via: Office website     Follow up:  Will follow up with patient in 5-10 business days to confirm application(s) have been received.  Raevyn Sokol P. Akiva Josey, [place]  [phone]

## 2020-03-30 NOTE — Chronic Care Management (AMB) (Signed)
  Chronic Care Management   Note  03/30/2020 Name: Amanda [name] MRN: [id number] DOB: [birth year]  Received the following update from [name] with [name].  Patient is a 69 year old female with multiple medical conditions including but not limited to: DJD (cervical), type 2 diabetes, HTN, Seasonal allergies, osteopenia, and hyperlipidemia.    She was called regarding medication assistance. HIPAA identifiers were obtained. Patient reported she has not been able to afford Ozempic and has been getting samples from her PCP.   Assessment:   HgA1c-6.3% 03/23/20  On statin-Atorvastatin last filled: 02/10/2020 for a 90-day supply    Medication Assistance Findings:  Medication assistance needs identified: Ozempic   -Patient is over income for LIS/Extra Help  -She may qualify to receive Ozempic through [name] Patient Assistance Program.  -Patient communicated understanding about application completion as well as the financial documentation that will be needed.   Additional medication assistance options reviewed with patient as warranted:  No other options identified   Plan:  I will route patient assistance letter to [name] technician who will coordinate patient assistance program application process for medications listed above. [name] pharmacy technician will assist with obtaining all required documents from both patient and provider(s) and submit application(s) once completed.    Patient's case is being closed as the [name] Team has transitioned to the Quality Department and will no longer document in [facility].   She will continue to be followed through the patient assistance process by [name], [name]    [name], [name], [name]  [phone]     Follow up plan: The care management team will reach out to the patient again over the next 7-10 days.   [name] RN, CCM, [facility] RN Care  Manager [phone]

## 2020-03-30 NOTE — Patient Outreach (Addendum)
[facility]) Care Management  [place]   03/30/2020  Amanda [name] 31-Dec-1950 [id number]  Reason for referral: medication assistance  Referral source: [facility] RN/Dr. [name] Referral medication(s):  Current insurance: [facility]  PMHx:  Patient is a 69 year old female with multiple medical conditions including but not limited to: DJD (cervical),  Type 2 diabetes, HTN, Seasonal allergies, osteopenia, and hyperlipidemia.    She was called regarding medication assistance. HIPAA identifiers were obtained.  Patient reported she has not been able to afford Ozempic and has been getting samples from her PCP.   Objective: Allergies  Allergen Reactions  . Gabapentin Other (See Comments)    Pt states medication made her feel "out of it".   Pt states medication made her feel "out of it".      Medications Reviewed Today    Reviewed by [name], MD (Resident) on 03/24/20 at 1012  Med List Status: <None>  Medication Order Taking? Sig Documenting Provider Last Dose Status Informant  ACCU-CHEK AVIVA PLUS test strip [id number]  USE AS DIRECTED UP TO FOUR TIMES DAILY [name], MD  Active   Accu-Chek Softclix Lancets lancets [id number]  USE AS DIRECTED UP TO FOUR TIMES DAILY [name], MD  Active   acetaminophen (TYLENOL) 325 MG tablet [id number] No Take 2 tablets (650 mg total) by mouth every 6 (six) hours as needed (or Fever >/= 101). Dorian Heckle, MD Taking Active Self  amLODipine (NORVASC) 5 MG tablet [id number]  TAKE 1 TABLET (5 MG TOTAL) BY MOUTH DAILY. [name], MD  Active   atorvastatin (LIPITOR) 40 MG tablet [id number]  Take 1 tablet (40 mg total) by mouth daily at 6 PM. [name], MD  Active   blood glucose meter kit and supplies KIT [id number]  Dispense based on patient and insurance preference. Use up to four times daily as directed. [name], MD  Active   Capsaicin-Menthol-Methyl Sal (CAPSAICIN-METHYL SAL-MENTHOL)  0.025-1-12 % CREA [id number] No Apply 1 application topically as needed. Apply over painful areas Mosetta Anis, MD Taking Active   cetirizine (ZYRTEC) 5 MG tablet [id number] No Take 1 tablet (5 mg total) by mouth daily. [name], MD Taking Active   Cholecalciferol (VITAMIN D3) 2000 units capsule [id number] No Take by mouth daily.  [provider] Taking Active            Med Note (DAVIS, SOPHIA A   Fri Mar 02, 2018  2:06 PM)    Continuous Blood Gluc Sensor (FREESTYLE LIBRE 14 DAY SENSOR) [place] [id number] No 1 each by Does not apply route 4 (four) times daily. Lorella Nimrod, MD Taking Active   diclofenac Sodium (VOLTAREN) 1 % GEL [id number]  Apply topically. [provider]  Active   Ferrous Sulfate (IRON) 325 (65 Fe) MG TABS [id number]  Take 1 tablet (325 mg total) by mouth daily. Geradine Girt, DO  Active   fluticasone (FLONASE) 50 MCG/ACT nasal spray [id number] No Place 2 sprays into both nostrils daily. [name], MD Taking Active   latanoprost (XALATAN) 0.005 % ophthalmic solution [id number] No 1 drop at bedtime. [provider] unk Active            Med Note Luana Shu, MONCHELL R   Tue Sep 10, 2019 12:13 PM) LF 6.4.20 #2.5/30  metFORMIN (GLUCOPHAGE-XR) 500 MG 24 hr tablet [id number]  Take 2 tablets (1,000 mg total) by mouth 2 (two) times daily with a meal.  [name], MD  Active   Multiple Vitamins-Minerals (WOMENS 50+ MULTI VITAMIN/MIN) TABS [id number] No Take 1 tablet by mouth daily. Lucious Groves, DO Taking Active Self  Semaglutide,0.25 or 0.5MG/DOS, (OZEMPIC, 0.25 OR 0.5 MG/DOSE,) 2 MG/1.5ML SOPN [id number]  Inject 0.5 mg into the skin once a week. Velna Ochs, MD  Active           Assessment:  HgA1c-6.3% 03/23/20 On statin-Atorvastatin last filled: 02/10/2020 for a 90 day supply   Medication Assistance Findings:  Medication assistance needs identified: Ozempic   -Patient is over income for LIS/Extra Help -She may qualify to receive  Ozempic through [facility] Patient Assistance Program. -Patient communicated understanding about application completion as well as the financial documentation that will be needed.  Additional medication assistance options reviewed with patient as warranted:  No other options identified   Patient reported she only had one Ozempic injection in her possession. She was encouraged to call her provider's office about getting more samples. (A request will be made on her behalf when her provider is alerted about the need for medication assistance.)  Plan: I will route patient assistance letter to [place] technician who will coordinate patient assistance program application process for medications listed above.  [facility] pharmacy technician will assist with obtaining all required documents from both patient and provider(s) and submit application(s) once completed.    Patient's case is being closed as the [place] Team has transitioned to the Quality Department and will no longer document in [facility].  She will continue to be followed through the patient assistance process by [name], [name]   [name], [name], [place] Clinical Pharmacist [phone]

## 2020-04-03 ENCOUNTER — Ambulatory Visit: Payer: Medicare Other | Admitting: *Deleted

## 2020-04-03 DIAGNOSIS — E113513 Type 2 diabetes mellitus with proliferative diabetic retinopathy with macular edema, bilateral: Secondary | ICD-10-CM

## 2020-04-03 DIAGNOSIS — I10 Essential (primary) hypertension: Secondary | ICD-10-CM

## 2020-04-03 NOTE — Chronic Care Management (AMB) (Signed)
  Chronic Care Management   Note  04/03/2020 Name: Amanda [name] MRN: [id number] DOB: Sep 23, 1951   Reached patient via mobile number, 2 HIPAA identifiers verified. Explained chronic care management services and patient agrees to services. Arranged for initial telephone intake for 04/08/20 at 9:00 am. She shared concerns of chronic diarrhea with unintentional weight loss. She says she has not seen her gastroenterologist in about a year. Encouraged her to make an appointment with her gastroenterologist as soon as possible and to try psyllium as a bulking agent until she can see her provider.   Follow up plan: Telephone follow up appointment with care management team member scheduled for: 04/08/20 at 9:00 am  [name] RN, CCM, [facility] RN Care Manager [phone]

## 2020-04-07 ENCOUNTER — Other Ambulatory Visit: Payer: Self-pay | Admitting: Pharmacy Technician

## 2020-04-07 NOTE — Patient Outreach (Signed)
[facility]) Care Management  04/07/2020  Amanda [name] 29-Jan-1951 [id number]   Successful call placed to patient regarding patient assistance application(s) for Ozempic with [employer] , HIPAA identifiers verified.   Patient confirmed receiving the application but has not completed it yet. Informed patient to pay attention to the check list on the letter as it states other required documents that are needed for the patient assistance company to process the application. Also informed that my business card was attached with my number if she had any questions about the application.  Patient informed she would complete and send back.  Patient also informed that she was running low on medication. Encouraged patient to outreach the Internal medicine clinic and inquire about samples.   She also inquired if her scheduled appointment for tomorrow would still occur. Informed that the RN Amanda [name] does have a call scheduled to outreach patient tomorrow and to be expecting that call. Patient verbalized understanding.  Will route Amanda Churn, RN with this encounter for FYI.  Follow up:  Will route note to [place] for case closure if document(s) have not been received in the next 15 business days.  Myrna Vonseggern P. Martavius Lusty, [place]  [phone]

## 2020-04-08 ENCOUNTER — Encounter: Payer: Self-pay | Admitting: *Deleted

## 2020-04-08 ENCOUNTER — Ambulatory Visit: Payer: Medicare Other | Admitting: *Deleted

## 2020-04-08 ENCOUNTER — Telehealth: Payer: Self-pay

## 2020-04-08 ENCOUNTER — Ambulatory Visit: Payer: Self-pay | Admitting: *Deleted

## 2020-04-08 DIAGNOSIS — Z794 Long term (current) use of insulin: Secondary | ICD-10-CM

## 2020-04-08 DIAGNOSIS — I10 Essential (primary) hypertension: Secondary | ICD-10-CM

## 2020-04-08 DIAGNOSIS — E118 Type 2 diabetes mellitus with unspecified complications: Secondary | ICD-10-CM

## 2020-04-08 NOTE — Patient Instructions (Signed)
Visit Information It was very nice speaking with you today. I will contact clinic staff about Ozempic samples. I have referred you to [facility] to address your caregiver stress . Please review the educational articles I mailed to you and we will discuss on our next call.   Goals Addressed            This Visit's Progress     Patient Stated   . " My blood sugar and blood pressure have been running higher because of the stress because of family issues." (pt-stated)       CARE PLAN ENTRY (see longitudinal plan of care for additional care plan information)  Current Barriers:  . Chronic Disease Management support, education, and care coordination needs related to HTN, HLD, DMII, and CKD - patient states her stress level has increased due to caregiving responsibilities for her disabled husband and other family concerns and she has seen an increase in her blood pressure and blood sugar. She says she would very much appreciate any caregiver support resources. She also says she has been consuming more salt in her diet and she knows this is contributing to her increased blood pressure readings.  She also says she I shaving increased neuropathic symptoms in her legs and feet and treats the pain with compression stockings and Voltaren gel.   Clinical Goal(s) related to HTN, HLD, DMII, and CKD Stage. Over the next 30 days, patient will:  . Work with the care management team to address educational, disease management, and care coordination needs  . Begin or continue self health monitoring activities as directed today Measure and record CBG (blood glucose) 1-2 times daily and Measure and record blood pressure 5-7 times per week . Call provider office for new or worsened signs and symptoms Blood glucose findings outside established parameters, Blood pressure findings outside established parameters, and New or worsened symptom related to HTN, HLD, DMII, and chronic diarrhea . Call care management  team with questions or concerns . Verbalize basic understanding of patient centered plan of care established today  Interventions related to HTN, HLD, DMII, and CKD. [name] Evaluation of current treatment plans and patient's adherence to plan as established by provider . Assessed patient understanding of disease states . Assessed patient's education and care coordination needs . Provided disease specific education to patient - mailed patient [place] [facility] spiral bound calendar with action plans and stoplight information for HTN and DM,  Emmi education articles on peripheral and diabetic neuropathy and how to control blood pressure through lifestyle changes and copy of the DASH diet . Collaborated with appropriate clinical care team members regarding patient needs- referral made to [facility] to provide patient with caregiver stress resources and complete social determinants of health screening . Message to Clinic Triage Pool requesting Ozempic samples for patient until she receives ongoing assistance through the pharmaceutical patient assistance program with the help of [place]   Patient Self Care Activities related to HTN, HLD, DMII, and CKD. [name] Patient is unable to independently self-manage chronic health conditions  Initial goal documentation       Other   . Blood Pressure < 140/90       BP Readings from Last 3 Encounters:  03/23/20 (!) 157/86  12/24/19 110/75  11/11/19 130/76       . HEMOGLOBIN A1C < 7.0       Lab Results  Component Value Date   HGBA1C 6.3 (A) 03/23/2020  Meeting Hgb A1C target.    [name] LDL CALC < 100       Lab Results  Component Value Date   CHOL 127 09/10/2019   HDL 54 09/10/2019   LDLCALC 54 09/10/2019   TRIG 115 09/13/2019   CHOLHDL 2.4 09/10/2019   Meeting targets        Ms. Bond was given information about Chronic Care Management services today including:  1. CCM service includes  personalized support from designated clinical staff supervised by her physician, including individualized plan of care and coordination with other care providers 2. 24/7 contact phone numbers for assistance for urgent and routine care needs. 3. Service will only be billed when office clinical staff spend 20 minutes or more in a month to coordinate care. 4. Only one practitioner may furnish and bill the service in a calendar month. 5. The patient may stop CCM services at any time (effective at the end of the month) by phone call to the office staff. 6. The patient will be responsible for cost sharing (co-pay) of up to 20% of the service fee (after annual deductible is met).  Patient agreed to services and verbal consent obtained.   The patient verbalized understanding of instructions provided today and declined a print copy of patient instruction materials.   The care management team will reach out to the patient again over the next 7 days.   [name] RN, CCM, [facility] RN Care Manager [phone]

## 2020-04-08 NOTE — Telephone Encounter (Signed)
TC placed to patient to inform her that an ozempic sample has been pulled and ready for pick up at the [facility].  Call was disconnected.  RN tried calling again, no answer. SChaplin, RN,BSN

## 2020-04-08 NOTE — Chronic Care Management (AMB) (Signed)
Chronic Care Management   Initial Visit Note  04/08/2020 Name: Amanda [name] MRN: [id number] DOB: August 12, 1951  Referred by: [name], MD Reason for referral : Chronic Care Management (DM, HTN)   Amanda [name] is a 69 y.o. year old female who is a primary care patient of [name], Amanda Pane, MD. The CCM team was consulted for assistance with chronic disease management and care coordination needs related to HTN, HLD, DMII and CKD and caregiver stress.  Review of patient status, including review of consultants reports, relevant laboratory and other test results, and collaboration with appropriate care team members and the patient's provider was performed as part of comprehensive patient evaluation and provision of chronic care management services.    SDOH (Social Determinants of Health) assessments performed: No See Care Plan activities for detailed interventions related to SDOH     Medications: Outpatient Encounter Medications as of 04/08/2020  Medication Sig Note  . ACCU-CHEK AVIVA PLUS test strip USE AS DIRECTED UP TO FOUR TIMES DAILY   . Accu-Chek Softclix Lancets lancets USE AS DIRECTED UP TO FOUR TIMES DAILY   . acetaminophen (TYLENOL) 325 MG tablet Take 2 tablets (650 mg total) by mouth every 6 (six) hours as needed (or Fever >/= 101).   [name] amLODipine (NORVASC) 5 MG tablet TAKE 1 TABLET (5 MG TOTAL) BY MOUTH DAILY.   [name] atorvastatin (LIPITOR) 40 MG tablet Take 1 tablet (40 mg total) by mouth daily at 6 PM.   . blood glucose meter kit and supplies KIT Dispense based on patient and insurance preference. Use up to four times daily as directed. .   . Capsaicin-Menthol-Methyl Sal (CAPSAICIN-METHYL SAL-MENTHOL) 0.025-1-12 % CREA Apply 1 application topically as needed. Apply over painful areas   . cetirizine (ZYRTEC) 5 MG tablet Take 1 tablet (5 mg total) by mouth daily.   . Cholecalciferol (VITAMIN D3) 2000 units capsule Take by mouth daily.    . diclofenac Sodium (VOLTAREN) 1 %  GEL Apply topically. 04/08/2020: Uses prn  . metFORMIN (GLUCOPHAGE-XR) 500 MG 24 hr tablet Take 2 tablets (1,000 mg total) by mouth 2 (two) times daily with a meal.   . Semaglutide,0.25 or 0.5MG/DOS, (OZEMPIC, 0.25 OR 0.5 MG/DOSE,) 2 MG/1.5ML SOPN Inject 0.5 mg into the skin once a week. 04/08/2020: Injects Ozempic on Saturday  . Ferrous Sulfate (IRON) 325 (65 Fe) MG TABS Take 1 tablet (325 mg total) by mouth daily.   . fluticasone (FLONASE) 50 MCG/ACT nasal spray Place 2 sprays into both nostrils daily.   [name] latanoprost (XALATAN) 0.005 % ophthalmic solution 1 drop at bedtime. 09/10/2019: LF 6.4.20 #2.5/30  . Multiple Vitamins-Minerals (WOMENS 50+ MULTI VITAMIN/MIN) TABS Take 1 tablet by mouth daily.    No facility-administered encounter medications on file as of 04/08/2020.     Objective:  Lab Results  Component Value Date   HGBA1C 6.3 (A) 03/23/2020   HGBA1C 7.6 (A) 12/24/2019   HGBA1C 10.9 (H) 09/09/2019   Lab Results  Component Value Date   MICROALBUR 18.4 (H) 06/16/2015 over due for annual update   LDLCALC 54 09/10/2019   CREATININE 1.19 (H) 12/24/2019    Goals Addressed            This Visit's Progress     Patient Stated   . " My blood sugar and blood pressure have been running higher because of the stress because of family issues." (pt-stated)       [place] (see longitudinal plan of care for additional  care plan information)  Current Barriers:  . Chronic Disease Management support, education, and care coordination needs related to HTN, HLD, DMII, and CKD - patient states her stress level has increased due to caregiving responsibilities for her disabled husband and other family concerns and she has seen an increase in her blood pressure and blood sugar. She says she would very much appreciate any caregiver support resources. She also says she has been consuming more salt in her diet and she knows this is contributing to her increased blood pressure readings.  She also  says she I shaving increased neuropathic symptoms in her legs and feet and treats the pain with compression stockings and Voltaren gel.   Clinical Goal(s) related to HTN, HLD, DMII, and CKD Stage. Over the next 30 days, patient will:  . Work with the care management team to address educational, disease management, and care coordination needs  . Begin or continue self health monitoring activities as directed today Measure and record CBG (blood glucose) 1-2 times daily and Measure and record blood pressure 5-7 times per week . Call provider office for new or worsened signs and symptoms Blood glucose findings outside established parameters, Blood pressure findings outside established parameters, and New or worsened symptom related to HTN, HLD, DMII, and chronic diarrhea . Call care management team with questions or concerns . Verbalize basic understanding of patient centered plan of care established today  Interventions related to HTN, HLD, DMII, and CKD. [name] Evaluation of current treatment plans and patient's adherence to plan as established by provider . Assessed patient understanding of disease states . Assessed patient's education and care coordination needs . Provided disease specific education to patient - mailed patient [place] Management spiral bound calendar with action plans and stoplight information for HTN and DM,  Emmi education articles on peripheral and diabetic neuropathy and how to control blood pressure through lifestyle changes and copy of the DASH diet . Collaborated with appropriate clinical care team members regarding patient needs- referral made to [place] to provide patient with caregiver stress resources and complete social determinants of health screening . Message to Clinic Triage Pool requesting Ozempic samples for patient until she receives ongoing assistance through the pharmaceutical patient assistance program with the help of [place]   Patient Self Care Activities related to HTN, HLD, DMII, and CKD. [name] Patient is unable to independently self-manage chronic health conditions  Initial goal documentation       Other   . Blood Pressure < 140/90       BP Readings from Last 3 Encounters:  03/23/20 (!) 157/86  12/24/19 110/75  11/11/19 130/76       . HEMOGLOBIN A1C < 7.0       Lab Results  Component Value Date   HGBA1C 6.3 (A) 03/23/2020   Meeting Hgb A1C target.    [name] LDL CALC < 100       Lab Results  Component Value Date   CHOL 127 09/10/2019   HDL 54 09/10/2019   LDLCALC 54 09/10/2019   TRIG 115 09/13/2019   CHOLHDL 2.4 09/10/2019   Meeting targets         Ms. Dier was given information about Chronic Care Management services today including:  1. CCM service includes personalized support from designated clinical staff supervised by her physician, including individualized plan of care and coordination with other care providers 2. 24/7 contact phone numbers for assistance for urgent and routine  care needs. 3. Service will only be billed when office clinical staff spend 20 minutes or more in a month to coordinate care. 4. Only one practitioner may furnish and bill the service in a calendar month. 5. The patient may stop CCM services at any time (effective at the end of the month) by phone call to the office staff. 6. The patient will be responsible for cost sharing (co-pay) of up to 20% of the service fee (after annual deductible is met).  Patient agreed to services and verbal consent obtained.   Plan:   The care management team will reach out to the patient again over the next 7 days.   [name] RN, CCM, [facility] RN Care Manager [phone]

## 2020-04-08 NOTE — Progress Notes (Signed)
Internal Medicine Clinic Attending  CCM services provided by the care management provider and their documentation were discussed with Dr. Shan Levans. We reviewed the pertinent findings, urgent action items addressed by the resident and non-urgent items to be addressed by the PCP.  I agree with the assessment, diagnosis, and plan of care documented in the CCM and resident's note.  Lucious Groves, DO 04/08/2020

## 2020-04-08 NOTE — Chronic Care Management (AMB) (Signed)
  Chronic Care Management   Outreach Note  04/08/2020 Name: Amanda [name] MRN: [id number] DOB: 08-Feb-1951  Referred by: [name], MD Reason for referral : Chronic Care Management (DM, HTN)   Attempted to reach patient on mobile number. Message left with request to return call and to call internal medicine clinic to arrange for Ozempic sample pick up this week so that she has enough for her Saturday injection.  Follow Up Plan: The care management team will reach out to the patient again over the next 7 days.   [name] RN, CCM, [facility] RN Care Manager [phone]

## 2020-04-08 NOTE — Progress Notes (Signed)
Internal Medicine Clinic Resident   I have personally reviewed this encounter including the documentation in this note and/or discussed this patient with the care management provider. I will address any urgent items identified by the care management provider and will communicate my actions to the patient's PCP. I have reviewed the patient's CCM visit with my supervising attending.  Maudie Mercury, MD 04/08/2020

## 2020-04-09 ENCOUNTER — Ambulatory Visit: Payer: Medicare Other | Admitting: *Deleted

## 2020-04-09 DIAGNOSIS — Z794 Long term (current) use of insulin: Secondary | ICD-10-CM

## 2020-04-09 DIAGNOSIS — I10 Essential (primary) hypertension: Secondary | ICD-10-CM

## 2020-04-09 DIAGNOSIS — E118 Type 2 diabetes mellitus with unspecified complications: Secondary | ICD-10-CM

## 2020-04-09 NOTE — Patient Instructions (Signed)
Visit Information [facility] will call you to provide you with dental care resources.   Goals Addressed            This Visit's Progress     Patient Stated   . " I need to have my teeth checked because I have diabetes but my health insurance plan doesn't cover dental expenses. Can you help me?" (pt-stated)       CARE PLAN ENTRY (see longitudinal plan of care for additional care plan information)  Current Barriers:  . Care Coordination needs related to community dental care resources in a patient with DM, HTN, HDL.  Nurse Case Manager Clinical Goal(s):  [name] Over the next 7 days, patient will work with community care guide for dental care resources.  Interventions:  . Inter-disciplinary care team collaboration (see longitudinal plan of care) . Care Guide referral for community dental care resources  Patient Self Care Activities:  . Patient verbalizes understanding of plan to work with care guide to receive community dental care resources . Needs help with securing dental care resources  Initial goal documentation     . " My blood sugar and blood pressure have been running higher because of the due to family issues." (pt-stated)       [place] (see longitudinal plan of care for additional care plan information)  Current Barriers:  . Chronic Disease Management support, education, and care coordination needs related to HTN, HLD, DMII, and CKD - patient returned call to [facility] RN and states she  will pick up the Ozempic sample at the clinic today, she also asked for resources for dental care as her [facility] Medicare plan does not cover dental care.   . Clinical Goal(s) related to HTN, HLD, DMII, and CKD Stage. Over the next 30 days, patient will:  . Work with the care management team to address educational, disease management, and care coordination needs  . Begin or continue self health monitoring activities as directed today Measure and record CBG (blood glucose) 1-2  times daily and Measure and record blood pressure 5-7 times per week . Call provider office for new or worsened signs and symptoms Blood glucose findings outside established parameters, Blood pressure findings outside established parameters, and New or worsened symptom related to HTN, HLD, DMII, and chronic diarrhea . Call care management team with questions or concerns . Verbalize basic understanding of patient centered plan of care established today  Interventions related to HTN, HLD, DMII, and CKD. [name] Evaluation of current treatment plans and patient's adherence to plan as established by provider . Assessed patient understanding of disease states . Assessed patient's education and care coordination needs . Provided disease specific education to patient - mailed patient [place] [facility] spiral bound calendar with action plans and stoplight information for HTN and DM,  [place]mmi education articles on peripheral and diabetic neuropathy and how to control blood pressure through lifestyle changes and copy of the DASH diet . Collaborated with appropriate clinical care team members regarding patient needs- referral made to [place] to provide patient with caregiver stress resources and complete social determinants of health screening . Message to Clinic Triage Pool requesting Ozempic samples for patient until she receives ongoing assistance through the pharmaceutical patient assistance program with the help of [place]  . 04/08/20 Received return call from patient. [place] [place] [facility] that patient will be given a sample of Ozempic from  clinic today  Patient Self Care Activities related to HTN, HLD, DMII, and CKD. [name] Patient is unable to independently self-manage chronic health conditions  Please see past updates related to this goal by clicking on the "Past Updates" button in the selected goal          The patient verbalized understanding of instructions provided today and declined a print copy of patient instruction materials.   The care management team will reach out to the patient again over the next 7 days.   [name] RN, CCM, [facility] RN Care Manager [phone]

## 2020-04-09 NOTE — Chronic Care Management (AMB) (Signed)
Chronic Care Management   Follow Up Note   04/09/2020 Name: NYSHA KOPLIN MRN: [id number] DOB: 04-Feb-1951  Referred by: [name], MD Reason for referral : Chronic Care Management (HTN, DM, HLD)   DANDREA MEDDERS is a 69 y.o. year old female who is a primary care patient of [name], [name], MD. The CCM team was consulted for assistance with chronic disease management and care coordination needs.    Review of patient status, including review of consultants reports, relevant laboratory and other test results, and collaboration with appropriate care team members and the patient's provider was performed as part of comprehensive patient evaluation and provision of chronic care management services.    SDOH (Social Determinants of Health) assessments performed: No See Care Plan activities for detailed interventions related to [facility])     Outpatient Encounter Medications as of 04/09/2020  Medication Sig Note  . ACCU-CHEK AVIVA PLUS test strip USE AS DIRECTED UP TO FOUR TIMES DAILY   . Accu-Chek Softclix Lancets lancets USE AS DIRECTED UP TO FOUR TIMES DAILY   . acetaminophen (TYLENOL) 325 MG tablet Take 2 tablets (650 mg total) by mouth every 6 (six) hours as needed (or Fever >/= 101).   [name] amLODipine (NORVASC) 5 MG tablet TAKE 1 TABLET (5 MG TOTAL) BY MOUTH DAILY.   [name] atorvastatin (LIPITOR) 40 MG tablet Take 1 tablet (40 mg total) by mouth daily at 6 PM.   . blood glucose meter kit and supplies KIT Dispense based on patient and insurance preference. Use up to four times daily as directed. .   . Capsaicin-Menthol-Methyl Sal (CAPSAICIN-METHYL SAL-MENTHOL) 0.025-1-12 % CREA Apply 1 application topically as needed. Apply over painful areas   . cetirizine (ZYRTEC) 5 MG tablet Take 1 tablet (5 mg total) by mouth daily.   . Cholecalciferol (VITAMIN D3) 2000 units capsule Take by mouth daily.    . diclofenac Sodium (VOLTAREN) 1 % GEL Apply topically. 04/08/2020: Uses prn  . Ferrous Sulfate  (IRON) 325 (65 Fe) MG TABS Take 1 tablet (325 mg total) by mouth daily.   . fluticasone (FLONASE) 50 MCG/ACT nasal spray Place 2 sprays into both nostrils daily.   [name] latanoprost (XALATAN) 0.005 % ophthalmic solution 1 drop at bedtime. 09/10/2019: LF 6.4.20 #2.5/30  . metFORMIN (GLUCOPHAGE-XR) 500 MG 24 hr tablet Take 2 tablets (1,000 mg total) by mouth 2 (two) times daily with a meal.   . Multiple Vitamins-Minerals (WOMENS 50+ MULTI VITAMIN/MIN) TABS Take 1 tablet by mouth daily.   . Semaglutide,0.25 or 0.5MG/DOS, (OZEMPIC, 0.25 OR 0.5 MG/DOSE,) 2 MG/1.5ML SOPN Inject 0.5 mg into the skin once a week. 04/08/2020: Injects Ozempic on Saturday   No facility-administered encounter medications on file as of 04/09/2020.     Objective:  Lab Results  Component Value Date   HGBA1C 6.3 (A) 03/23/2020   HGBA1C 7.6 (A) 12/24/2019   HGBA1C 10.9 (H) 09/09/2019   Lab Results  Component Value Date   MICROALBUR 18.4 (H) 06/16/2015 - needs yearly update   LDLCALC 54 09/10/2019   CREATININE 1.19 (H) 12/24/2019   BP Readings from Last 3 Encounters:  03/23/20 (!) 157/86  12/24/19 110/75  11/11/19 130/76   Lab Results  Component Value Date   CHOL 127 09/10/2019   HDL 54 09/10/2019   LDLCALC 54 09/10/2019   TRIG 115 09/13/2019   CHOLHDL 2.4 09/10/2019   Goals Addressed            This Visit's Progress  Patient Stated   . " I need to have my teeth checked because I have diabetes but my health insurance plan doesn't cover dental expenses. Can you help me?" (pt-stated)       CARE PLAN ENTRY (see longitudinal plan of care for additional care plan information)  Current Barriers:  . Care Coordination needs related to community dental care resources in a patient with DM, HTN, HDL.  Nurse Case Manager Clinical Goal(s):  [name] Over the next 7 days, patient will work with community care guide for dental care resources.  Interventions:  . Inter-disciplinary care team collaboration (see longitudinal  plan of care) . Care Guide referral for community dental care resources  Patient Self Care Activities:  . Patient verbalizes understanding of plan to work with care guide to receive community dental care resources . Needs help with securing dental care resources  Initial goal documentation     . " My blood sugar and blood pressure have been running higher because of the due to family issues." (pt-stated)       [place] (see longitudinal plan of care for additional care plan information)  Current Barriers:  . Chronic Disease Management support, education, and care coordination needs related to HTN, HLD, DMII, and CKD - patient returned call to [facility] RN and states she  will pick up the Ozempic sample at the clinic today, she also asked for resources for dental care as her [facility] Medicare plan does not cover dental care.   . Clinical Goal(s) related to HTN, HLD, DMII, and CKD Stage. Over the next 30 days, patient will:  . Work with the care management team to address educational, disease management, and care coordination needs  . Begin or continue self health monitoring activities as directed today Measure and record CBG (blood glucose) 1-2 times daily and Measure and record blood pressure 5-7 times per week . Call provider office for new or worsened signs and symptoms Blood glucose findings outside established parameters, Blood pressure findings outside established parameters, and New or worsened symptom related to HTN, HLD, DMII, and chronic diarrhea . Call care management team with questions or concerns . Verbalize basic understanding of patient centered plan of care established today  Interventions related to HTN, HLD, DMII, and CKD. [name] Evaluation of current treatment plans and patient's adherence to plan as established by provider . Assessed patient understanding of disease states . Assessed patient's education and care coordination needs . Provided disease specific education to  patient - mailed patient [place] Management spiral bound calendar with action plans and stoplight information for HTN and DM,  [place]i education articles on peripheral and diabetic neuropathy and how to control blood pressure through lifestyle changes and copy of the DASH diet . Collaborated with appropriate clinical care team members regarding patient needs- referral made to [place] to provide patient with caregiver stress resources and complete social determinants of health screening . Message to Clinic Triage Pool requesting Ozempic samples for patient until she receives ongoing assistance through the pharmaceutical patient assistance program with the help of [place]  . 04/08/20 Received return call from patient. [place] Simox that patient will be given a sample of Ozempic from  clinic today  Patient Self Care Activities related to HTN, HLD, DMII, and CKD. [name] Patient is unable to independently self-manage chronic health conditions  Please see past updates related to this goal by clicking on the "  Past Updates" button in the selected goal           Plan:   The care management team will reach out to the patient again over the next 7 days.    [name] RN, CCM, [facility] RN Care Manager [phone]

## 2020-04-09 NOTE — Progress Notes (Signed)
Internal Medicine Clinic Resident   I have personally reviewed this encounter including the documentation in this note and/or discussed this patient with the care management provider. I will address any urgent items identified by the care management provider and will communicate my actions to the patient's PCP. I have reviewed the patient's CCM visit with my supervising attending.  Lorrin Nawrot, MD 04/09/2020   

## 2020-04-14 ENCOUNTER — Ambulatory Visit: Payer: Self-pay

## 2020-04-14 NOTE — Chronic Care Management (AMB) (Signed)
  Care Management   Social Work Note  04/14/2020 Name: MARIYAH UPSHAW MRN: [id number] DOB: [birth year]  TOVE WIDEMAN is a 69 y.o. year old female who sees [name], [name], MD for primary care. The Care Management team was consulted for assistance.    SDOH (Social Determinants of Health) assessments performed: No   Successful outreach to patient today for purpose of initial social work assessment, however, she requested a call back tomorrow morning.            Ronn Melena, [place] Coordination Social Worker [place] [phone]

## 2020-04-15 ENCOUNTER — Ambulatory Visit: Payer: Medicare Other

## 2020-04-15 ENCOUNTER — Ambulatory Visit: Payer: Medicare Other | Admitting: *Deleted

## 2020-04-15 DIAGNOSIS — I10 Essential (primary) hypertension: Secondary | ICD-10-CM

## 2020-04-15 DIAGNOSIS — E113513 Type 2 diabetes mellitus with proliferative diabetic retinopathy with macular edema, bilateral: Secondary | ICD-10-CM

## 2020-04-15 DIAGNOSIS — Z794 Long term (current) use of insulin: Secondary | ICD-10-CM

## 2020-04-15 NOTE — Progress Notes (Signed)
Internal Medicine Clinic Attending  CCM services provided by the care management provider and their documentation were discussed with Dr. Winters. We reviewed the pertinent findings, urgent action items addressed by the resident and non-urgent items to be addressed by the PCP.  I agree with the assessment, diagnosis, and plan of care documented in the CCM and resident's note.  Rana Adorno, MD 04/15/2020  

## 2020-04-15 NOTE — Patient Instructions (Signed)
Visit Information  Goals Addressed            This Visit's Progress   . "For the most part we are good financially but so struggle from time to time" (pt-stated)       Current Barriers:  [name] Knowledge Barriers related to resources and support available to address financial constraints.    Case Manager Clinical Goal(s):  [name] Over the next 90 days, patient will work with BSW to address needs related to financial constraints. . Over the next 90 days, BSW will collaborate with RN Care Manager to address care management and care coordination needs  Interventions:  . Patient interviewed and appropriate assessments performed . Talked with patient about [facility] and agreed to send list of programs for her to have if needed.  . Educated patient about [place] [address] as useful option for various community resources . Submitted referral to Care Guide for assistance with Medicaid and [place] SNAP applications . Collaborated with RN Care Manager and patient to establish an individualized plan of care   Patient Self Care Activities:  . Self administers medications as prescribed . Performs ADL's independently . Performs IADL's independently  Initial goal documentation     . "We really need a ramp for my husband" (pt-stated)       Current Barriers:  [name] Knowledge Barriers related to resources available for ramp due to spouse's physical limitations.  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    Case Manager Clinical Goal(s):  [name] Over the next 180 days, patient will work with BSW to address needs related to spouse's physical limitations/need for ramp.   . Over the next 180 days, BSW will collaborate with RN Care Manager to address care management and care coordination needs  Interventions:  . Patient interviewed and appropriate assessments performed . Educated patient about ramp assistance  through [employer].  Informed her that agency requires referral to come directly from individual requesting assistance.  Provided her with contact information and encouraged her/spouse to call as soon as possible. . Educated patient about referral process and eligibility for ramp assistance through Vocational Rehabilitation.  Gathered necessary information and submitted referral.   . Collaborated with [place] and patient to establish an individualized plan of care   Patient Self Care Activities:  . Patient verbalizes understanding of plan to seek assistance through [facility] and [facility]ocational Rehabilitation for ramp assistance.   . Self administers medications as prescribed . Performs ADL's independently . Performs IADL's independently  Initial goal documentation        Patient verbalizes understanding of instructions provided today.   Telephone follow up appointment with care management team member scheduled for 5/19/121    Ronn Melena, [place] Coordination Social Worker [place] [phone]

## 2020-04-15 NOTE — Chronic Care Management (AMB) (Signed)
Chronic Care Management   Follow Up Note   04/15/2020 Name: Amanda [name] MRN: [id number] DOB: 10-29-1951  Referred by: [name], MD Reason for referral : Chronic Care Management (HTN, DM)   Amanda [name] is a 69 y.o. year old female who is a primary care patient of [name], [name], MD. The CCM team was consulted for assistance with chronic disease management and care coordination needs.    Review of patient status, including review of consultants reports, relevant laboratory and other test results, and collaboration with appropriate care team members and the patient's provider was performed as part of comprehensive patient evaluation and provision of chronic care management services.    SDOH (Social Determinants of Health) assessments performed: No See Care Plan activities for detailed interventions related to [facility])     Outpatient Encounter Medications as of 04/15/2020  Medication Sig Note  . ACCU-CHEK AVIVA PLUS test strip USE AS DIRECTED UP TO FOUR TIMES DAILY   . Accu-Chek Softclix Lancets lancets USE AS DIRECTED UP TO FOUR TIMES DAILY   . acetaminophen (TYLENOL) 325 MG tablet Take 2 tablets (650 mg total) by mouth every 6 (six) hours as needed (or Fever >/= 101).   [name] amLODipine (NORVASC) 5 MG tablet TAKE 1 TABLET (5 MG TOTAL) BY MOUTH DAILY.   [name] atorvastatin (LIPITOR) 40 MG tablet Take 1 tablet (40 mg total) by mouth daily at 6 PM.   . blood glucose meter kit and supplies KIT Dispense based on patient and insurance preference. Use up to four times daily as directed. .   . Capsaicin-Menthol-Methyl Sal (CAPSAICIN-METHYL SAL-MENTHOL) 0.025-1-12 % CREA Apply 1 application topically as needed. Apply over painful areas   . cetirizine (ZYRTEC) 5 MG tablet Take 1 tablet (5 mg total) by mouth daily.   . Cholecalciferol (VITAMIN D3) 2000 units capsule Take by mouth daily.    . diclofenac Sodium (VOLTAREN) 1 % GEL Apply topically. 04/08/2020: Uses prn  . Ferrous Sulfate (IRON)  325 (65 Fe) MG TABS Take 1 tablet (325 mg total) by mouth daily.   . fluticasone (FLONASE) 50 MCG/ACT nasal spray Place 2 sprays into both nostrils daily.   [name] latanoprost (XALATAN) 0.005 % ophthalmic solution 1 drop at bedtime. 09/10/2019: LF 6.4.20 #2.5/30  . metFORMIN (GLUCOPHAGE-XR) 500 MG 24 hr tablet Take 2 tablets (1,000 mg total) by mouth 2 (two) times daily with a meal.   . Multiple Vitamins-Minerals (WOMENS 50+ MULTI VITAMIN/MIN) TABS Take 1 tablet by mouth daily.   . Semaglutide,0.25 or 0.5MG/DOS, (OZEMPIC, 0.25 OR 0.5 MG/DOSE,) 2 MG/1.5ML SOPN Inject 0.5 mg into the skin once a week. 04/08/2020: Injects Ozempic on Saturday   No facility-administered encounter medications on file as of 04/15/2020.     Objective:   Goals Addressed            This Visit's Progress     Patient Stated   . " I need to have my teeth checked because I have diabetes but my health insurance plan doesn't cover dental expenses. Can you help me?" (pt-stated)       CARE PLAN ENTRY (see longitudinal plan of care for additional care plan information)  Current Barriers:  . Care Coordination needs related to community dental care resources in a patient with DM, HTN, HDL.- patient states she has not received a call from a care guide about dental care resources  Nurse Case Manager Clinical Goal(s):  [name] Over the next 7 days, patient will work with community  care guide for dental care resources.  Interventions:  . Inter-disciplinary care team collaboration (see longitudinal plan of care) . Contacted Care Guide manager about the status of the referral that was placed 04/09/20  Patient Self Care Activities:  . Patient verbalizes understanding of plan to work with care guide to receive community dental care resources . Needs help with securing dental care resources  Please see past updates related to this goal by clicking on the "Past Updates" button in the selected goal      . " My blood sugar and blood  pressure have been running higher because of the due to family issues." (pt-stated)       [place] (see longitudinal plan of care for additional care plan information)  Current Barriers:  . Chronic Disease Management support, education, and care coordination needs related to HTN, HLD, DMII, and CKD - patient states blood sugars and blood pressures are still running above baseline and she attributes this to stress, states she did pick up the Ozempic samples from the clinic but does have some questions for the pharm tech that is helping her with the application for the Ozempic patient assistance program  . Clinical Goal(s) related to HTN, HLD, DMII, and CKD Stage. Over the next 30 days, patient will:  . Work with the care management team to address educational, disease management, and care coordination needs  . Begin or continue self health monitoring activities as directed today Measure and record CBG (blood glucose) 1-2 times daily and Measure and record blood pressure 5-7 times per week . Call provider office for new or worsened signs and symptoms Blood glucose findings outside established parameters, Blood pressure findings outside established parameters, and New or worsened symptom related to HTN, HLD, DMII, and chronic diarrhea . Call care management team with questions or concerns . Verbalize basic understanding of patient centered plan of care established today  Interventions related to HTN, HLD, DMII, and CKD. [name] Evaluation of current treatment plans and patient's adherence to plan as established by provider . Assessed patient understanding of disease states . Assessed patient's education and care coordination needs . Provided disease specific education to patient - reviewed educational information that was mailed to her last week: the [place] Management spiral bound calendar with action plans and stoplight information for HTN and DM,  [place] education articles on  peripheral and diabetic neuropathy and how to control blood pressure through lifestyle changes and copy of the DASH diet. . Reviewed lifestyle strategies to help lower blood pressure including weight and stress management, reduce sodium intake and take medications as prescribed encouraged her to check her program activity benefits with her health insurance plan . Collaborated with appropriate clinical care team members regarding patient needs- notified [name] , [place] tech, that patient will be calling her about questions she has regarding completing the patient assistance program forms for Ozempic   Patient Self Care Activities related to HTN, HLD, DMII, and CKD. [name] Patient is unable to independently self-manage chronic health conditions  Please see past updates related to this goal by clicking on the "Past Updates" button in the selected goal          Plan:   The care management team will reach out to the patient again over the next 30 days.    [name] RN, CCM, [facility] RN Care Manager [phone]

## 2020-04-15 NOTE — Patient Instructions (Signed)
Visit Information It was nice speaking with you today.  Goals Addressed            This Visit's Progress     Patient Stated   . " I need to have my teeth checked because I have diabetes but my health insurance plan doesn't cover dental expenses. Can you help me?" (pt-stated)       CARE PLAN ENTRY (see longitudinal plan of care for additional care plan information)  Current Barriers:  . Care Coordination needs related to community dental care resources in a patient with DM, HTN, HDL.- patient states she has not received a call from a care guide about dental care resources  Nurse Case Manager Clinical Goal(s):  [name] Over the next 7 days, patient will work with community care guide for dental care resources.  Interventions:  . Inter-disciplinary care team collaboration (see longitudinal plan of care) . Contacted Care Guide manager about the status of the referral that was placed 04/09/20  Patient Self Care Activities:  . Patient verbalizes understanding of plan to work with care guide to receive community dental care resources . Needs help with securing dental care resources  Please see past updates related to this goal by clicking on the "Past Updates" button in the selected goal      . " My blood sugar and blood pressure have been running higher because of the due to family issues." (pt-stated)       Roy (see longitudinal plan of care for additional care plan information)  Current Barriers:  . Chronic Disease Management support, education, and care coordination needs related to HTN, HLD, DMII, and CKD - patient states blood sugars and blood pressures are still running above baseline and she attributes this to stress, states she did pick up the Ozempic samples from the clinic but does have some questions for the pharm tech that is helping her with the application for the Ozempic patient assistance program  . Clinical Goal(s) related to HTN, HLD, DMII, and CKD Stage. Over  the next 30 days, patient will:  . Work with the care management team to address educational, disease management, and care coordination needs  . Begin or continue self health monitoring activities as directed today Measure and record CBG (blood glucose) 1-2 times daily and Measure and record blood pressure 5-7 times per week . Call provider office for new or worsened signs and symptoms Blood glucose findings outside established parameters, Blood pressure findings outside established parameters, and New or worsened symptom related to HTN, HLD, DMII, and chronic diarrhea . Call care management team with questions or concerns . Verbalize basic understanding of patient centered plan of care established today  Interventions related to HTN, HLD, DMII, and CKD. [name] Evaluation of current treatment plans and patient's adherence to plan as established by provider . Assessed patient understanding of disease states . Assessed patient's education and care coordination needs . Provided disease specific education to patient - reviewed educational information that was mailed to her last week: the [place] [facility] spiral bound calendar with action plans and stoplight information for HTN and DM,  [place] education articles on peripheral and diabetic neuropathy and how to control blood pressure through lifestyle changes and copy of the DASH diet. . Reviewed lifestyle strategies to help lower blood pressure including weight and stress management, reduce sodium intake and take medications as prescribed encouraged her to check her program activity benefits with her health insurance plan . Collaborated with  appropriate clinical care team members regarding patient needs- notified [name] , [place] tech, that patient will be calling her about questions she has regarding completing the patient assistance program forms for Ozempic   Patient Self Care Activities related  to HTN, HLD, DMII, and CKD. [name] Patient is unable to independently self-manage chronic health conditions  Please see past updates related to this goal by clicking on the "Past Updates" button in the selected goal         The patient verbalized understanding of instructions provided today and declined a print copy of patient instruction materials.   The care management team will reach out to the patient again over the next 30 days.   [name] RN, CCM, [facility] RN Care Manager [phone]

## 2020-04-15 NOTE — Progress Notes (Signed)
Internal Medicine Clinic Resident   I have personally reviewed this encounter including the documentation in this note and/or discussed this patient with the care management provider. I will address any urgent items identified by the care management provider and will communicate my actions to the patient's PCP. I have reviewed the patient's CCM visit with my supervising attending.  Brandon Nussen Pullin, MD 04/15/2020    

## 2020-04-15 NOTE — Progress Notes (Signed)
Internal Medicine Clinic Attending  CCM services provided by the care management provider and their documentation were discussed with Dr. Shan [name]. We reviewed the pertinent findings, urgent action items addressed by the resident and non-urgent items to be addressed by the PCP.  I agree with the assessment, diagnosis, and plan of care documented in the CCM and resident's note.  Amanda Contes, MD 04/15/2020

## 2020-04-15 NOTE — Progress Notes (Signed)
Internal Medicine Clinic Resident   I have personally reviewed this encounter including the documentation in this note and/or discussed this patient with the care management provider. I will address any urgent items identified by the care management provider and will communicate my actions to the patient's PCP. I have reviewed the patient's CCM visit with my supervising attending.  Brandon Vyla Pint, MD 04/15/2020    

## 2020-04-15 NOTE — Chronic Care Management (AMB) (Signed)
Chronic Care Management    Clinical Social Work General Note  04/15/2020 Name: Amanda [name] MRN: [id number] DOB: 12/13/1951  Amanda [name] is a 68 y.o. year old female who is a primary care patient of [name], William B, MD. The CCM was consulted to assist the patient with Community Resources .   Amanda [name] was given information about Chronic Care Management services today including:  1. CCM service includes personalized support from designated clinical staff supervised by her physician, including individualized plan of care and coordination with other care providers 2. 24/7 contact phone numbers for assistance for urgent and routine care needs. 3. Service will only be billed when office clinical staff spend 20 minutes or more in a month to coordinate care. 4. Only one practitioner may furnish and bill the service in a calendar month. 5. The patient may stop CCM services at any time (effective at the end of the month) by phone call to the office staff. 6. The patient will be responsible for cost sharing (co-pay) of up to 20% of the service fee (after annual deductible is met).  Patient agreed to services and verbal consent obtained.   Review of patient status, including review of consultants reports, relevant laboratory and other test results, and collaboration with appropriate care team members and the patient's provider was performed as part of comprehensive patient evaluation and provision of chronic care management services.    SDOH (Social Determinants of Health) assessments and interventions performed:  Yes SDOH Interventions     Most Recent Value  SDOH Interventions  SDOH Interventions for the Following Domains  Financial Strain, Transportation  Financial Strain Interventions  Other (Comment) [referral to care guide for assistance with Medicaid and [place] SNAP applications]  Transportation Interventions  Other (Comment) [informed patient that she may have transportation benefit  through [employer].  Encouraged her to call customer service to verify]       Outpatient Encounter Medications as of 04/15/2020  Medication Sig Note  . ACCU-CHEK AVIVA PLUS test strip USE AS DIRECTED UP TO FOUR TIMES DAILY   . Accu-Chek Softclix Lancets lancets USE AS DIRECTED UP TO FOUR TIMES DAILY   . acetaminophen (TYLENOL) 325 MG tablet Take 2 tablets (650 mg total) by mouth every 6 (six) hours as needed (or Fever >/= 101).   . amLODipine (NORVASC) 5 MG tablet TAKE 1 TABLET (5 MG TOTAL) BY MOUTH DAILY.   . atorvastatin (LIPITOR) 40 MG tablet Take 1 tablet (40 mg total) by mouth daily at 6 PM.   . blood glucose meter kit and supplies KIT Dispense based on patient and insurance preference. Use up to four times daily as directed. .   . Capsaicin-Menthol-Methyl Sal (CAPSAICIN-METHYL SAL-MENTHOL) 0.025-1-12 % CREA Apply 1 application topically as needed. Apply over painful areas   . cetirizine (ZYRTEC) 5 MG tablet Take 1 tablet (5 mg total) by mouth daily.   . Cholecalciferol (VITAMIN D3) 2000 units capsule Take by mouth daily.    . diclofenac Sodium (VOLTAREN) 1 % GEL Apply topically. 04/08/2020: Uses prn  . Ferrous Sulfate (IRON) 325 (65 Fe) MG TABS Take 1 tablet (325 mg total) by mouth daily.   . fluticasone (FLONASE) 50 MCG/ACT nasal spray Place 2 sprays into both nostrils daily.   . latanoprost (XALATAN) 0.005 % ophthalmic solution 1 drop at bedtime. 09/10/2019: LF 6.4.20 #2.5/30  . metFORMIN (GLUCOPHAGE-XR) 500 MG 24 hr tablet Take 2 tablets (1,000 mg total) by mouth 2 (two) times daily with a meal.   .   Multiple Vitamins-Minerals (WOMENS 50+ MULTI VITAMIN/MIN) TABS Take 1 tablet by mouth daily.   . Semaglutide,0.25 or 0.5MG/DOS, (OZEMPIC, 0.25 OR 0.5 MG/DOSE,) 2 MG/1.5ML SOPN Inject 0.5 mg into the skin once a week. 04/08/2020: Injects Ozempic on Saturday   No facility-administered encounter medications on file as of 04/15/2020.    Goals Addressed            This Visit's Progress   . "For  the most part we are good financially but so struggle from time to time" (pt-stated)       Current Barriers:  . Knowledge Barriers related to resources and support available to address financial constraints.    Case Manager Clinical Goal(s):  . Over the next 90 days, patient will work with BSW to address needs related to financial constraints. . Over the next 90 days, BSW will collaborate with RN Care Manager to address care management and care coordination needs  Interventions:  . Patient interviewed and appropriate assessments performed . Talked with patient about [place] Emergency Assistance Programs and agreed to send list of programs for her to have if needed.  . Educated patient about [place] [address] as useful option for various community resources . Submitted referral to Care Guide for assistance with Medicaid and [place] SNAP applications . Collaborated with RN Care Manager and patient to establish an individualized plan of care   Patient Self Care Activities:  . Self administers medications as prescribed . Performs ADL's independently . Performs IADL's independently  Initial goal documentation     . "We really need a ramp for my husband" (pt-stated)       Current Barriers:  . Knowledge Barriers related to resources available for ramp due to spouse's physical limitations.  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    Case Manager Clinical Goal(s):  . Over the next 180 days, patient will work with BSW to address needs related to spouse's physical limitations/need for ramp.   . Over the next 180 days, BSW will collaborate with RN Care Manager to address care management and care coordination needs  Interventions:  . Patient interviewed and appropriate assessments performed . Educated patient about ramp assistance through [facility].  Informed her that agency requires referral to  come directly from individual requesting assistance.  Provided her with contact information and encouraged her/spouse to call as soon as possible. . Educated patient about referral process and eligibility for ramp assistance through [facility]ational [facility].  Gathered necessary information and submitted referral.   . Collaborated with RN Care Manager and patient to establish an individualized plan of care   Patient Self Care Activities:  . Patient verbalizes understanding of plan to seek assistance through [facility] and [facility] for ramp assistance.   . Self administers medications as prescribed . Performs ADL's independently . Performs IADL's independently  Initial goal documentation         Follow Up Plan: Appointment scheduled for SW follow up with client by phone on: 05/06/20       Kishana Battey, BSW Embedded Care Coordination Social Worker [facility] [phone]                             

## 2020-04-17 ENCOUNTER — Ambulatory Visit: Payer: Medicare Other | Admitting: Orthotics

## 2020-04-17 ENCOUNTER — Other Ambulatory Visit: Payer: Self-pay

## 2020-04-17 DIAGNOSIS — M2012 Hallux valgus (acquired), left foot: Secondary | ICD-10-CM | POA: Diagnosis not present

## 2020-04-17 DIAGNOSIS — E119 Type 2 diabetes mellitus without complications: Secondary | ICD-10-CM | POA: Diagnosis not present

## 2020-04-17 DIAGNOSIS — M2011 Hallux valgus (acquired), right foot: Secondary | ICD-10-CM | POA: Diagnosis not present

## 2020-04-20 ENCOUNTER — Encounter: Payer: Self-pay | Admitting: Internal Medicine

## 2020-04-20 ENCOUNTER — Telehealth: Payer: Self-pay | Admitting: Internal Medicine

## 2020-04-20 NOTE — Progress Notes (Signed)
Internal Medicine Clinic Attending  CCM services provided by the care management provider and their documentation were discussed with Dr. [name]. We reviewed the pertinent findings, urgent action items addressed by the resident and non-urgent items to be addressed by the PCP.  I agree with the assessment, diagnosis, and plan of care documented in the CCM and resident's note.  Krystel Fletchall, MD 04/20/2020  

## 2020-04-20 NOTE — Telephone Encounter (Signed)
°  Community Resource Referral   [place] 04/20/2020   Name: Amanda [name]    MRN: [id number]    DOB: 1951/11/03    AGE: 69 y.o.    GENDER: female    PCP [name], MD.   Called pt regarding Community Resource Referral for Dental Assistance discussed [facility] program to consult with her during the next Medicare enrollment period as [facility] has a supplemental dental plan or she may find one that is more affordable as well as dental resources for [employer]. She asked about wheel chair ramp for their home that they rent and reviewed requirements for [facility] (65y/o and up) transferred pt to [place] to start intake and wait list. Will mail resources to patient along with my contact information should she need further assistance.  [place] Management [email or web address]   [phone]

## 2020-04-22 ENCOUNTER — Other Ambulatory Visit: Payer: Self-pay

## 2020-04-22 ENCOUNTER — Encounter: Payer: Self-pay | Admitting: Podiatry

## 2020-04-22 ENCOUNTER — Ambulatory Visit (INDEPENDENT_AMBULATORY_CARE_PROVIDER_SITE_OTHER): Payer: Medicare Other | Admitting: Podiatry

## 2020-04-22 VITALS — Temp 97.2°F

## 2020-04-22 DIAGNOSIS — E1142 Type 2 diabetes mellitus with diabetic polyneuropathy: Secondary | ICD-10-CM | POA: Diagnosis not present

## 2020-04-22 DIAGNOSIS — B351 Tinea unguium: Secondary | ICD-10-CM | POA: Diagnosis not present

## 2020-04-22 DIAGNOSIS — M79675 Pain in left toe(s): Secondary | ICD-10-CM

## 2020-04-22 DIAGNOSIS — M79674 Pain in right toe(s): Secondary | ICD-10-CM

## 2020-04-22 NOTE — Progress Notes (Addendum)
This patient returns to my office for at risk foot care.  This patient requires this care by a professional since this patient will be at risk due to having diabetes.  This patient is unable to cut nails herself since the patient cannot reach her nails.These nails are painful walking and wearing shoes.  This patient presents for at risk foot care today.  General Appearance  Alert, conversant and in no acute stress.  Vascular  Dorsalis pedis and posterior tibial  pulses are palpable  bilaterally.  Capillary return is within normal limits  bilaterally. Temperature is within normal limits  bilaterally.  Neurologic  Senn-Weinstein monofilament wire test within normal limits  bilaterally. Muscle power within normal limits bilaterally.  Nails Thick disfigured discolored nails with subungual debris  from hallux to fifth toes bilaterally. Pincer hallux nail right foot. No evidence of bacterial infection or drainage bilaterally.  Orthopedic  No limitations of motion  feet .  No crepitus or effusions noted.  No bony pathology or digital deformities noted.  HAV  B/L.  Skin  normotropic skin with no porokeratosis noted bilaterally.  No signs of infections or ulcers noted.     Onychomycosis  Pain in right toes  Pain in left toes  Consent was obtained for treatment procedures.   Mechanical debridement of nails 1-5  bilaterally performed with a nail nipper.  Filed with dremel without incident.    Return office visit   10 weeks                   Told patient to return for periodic foot care and evaluation due to potential at risk complications.   Gardiner Barefoot DPM

## 2020-04-29 ENCOUNTER — Telehealth: Payer: Self-pay | Admitting: Internal Medicine

## 2020-04-29 NOTE — Telephone Encounter (Signed)
  Community Resource Referral   [place] 04/29/2020  1st Attempt  [facility]  Name: Amanda [name]   MRN: [id number]   DOB: [birth year]   AGE: 69 y.o.   GENDER: female   PCP [name], MD.   Called pt regarding Community Resource Referral LMTCB Follow up on: 04/30/2020  [place]is [place] . [email or web address]  [phone]

## 2020-05-01 NOTE — Telephone Encounter (Signed)
°  Community Resource Referral   [place] 05/01/2020   1st Attempt   [facility]   Name: Amanda [name]    MRN: [id number]    DOB: [birth year]    AGE: 69 y.o.    GENDER: female    PCP [name], MD.   Called pt regarding Community Resource Referral for Medicaid pt was at a funeral and asked that I call her back on Monday morning. LMTCB Follow up on: 05/04/2020  [place] Management [email or web address]   [phone]

## 2020-05-04 ENCOUNTER — Other Ambulatory Visit: Payer: Self-pay | Admitting: Internal Medicine

## 2020-05-04 DIAGNOSIS — E118 Type 2 diabetes mellitus with unspecified complications: Secondary | ICD-10-CM

## 2020-05-04 NOTE — Telephone Encounter (Signed)
  Community Resource Referral   [place] 05/04/2020  [place] [facility]  Name: Amanda [name]   MRN: [id number]   DOB: July 23, 1951   AGE: 69 y.o.   GENDER: female   PCP [name], MD.   Called pt regarding Community Resource Referral for Medicaid and [employer] Benefits. Pt stated that she applied for both about two weeks ago. Discussed process that she will receive a letter in the mail with status of application so be on the look out for that and that it may take up to a month depending on how many applications they have coming in and she will be assigned a case worker. She asked that I text her my contact information should she need it in the future.   [place] . [email or web address]  [phone]

## 2020-05-04 NOTE — Telephone Encounter (Signed)
Pt stated that she needed a refill for Semaglutide,0.25 or 0.5MG /DOS, (OZEMPIC, 0.25 OR 0.5 MG/DOSE,) 2 MG/1.5ML SOPN [[id number]]  Semaglutide,0.25 or 0.5MG /DOS, (OZEMPIC, 0.25 OR 0.5 MG/DOSE,) 2 MG/1.5ML SOPN [[id number]]   Please call her at [phone] to discuss.  Thank you, Jill Alexanders

## 2020-05-05 ENCOUNTER — Ambulatory Visit: Payer: Self-pay

## 2020-05-05 DIAGNOSIS — E119 Type 2 diabetes mellitus without complications: Secondary | ICD-10-CM

## 2020-05-05 DIAGNOSIS — I10 Essential (primary) hypertension: Secondary | ICD-10-CM

## 2020-05-05 NOTE — Telephone Encounter (Signed)
We do not have any samples of semaglutide right now and will not have any for at least 2 weeks. Deferring to Dr. Shan Levans.

## 2020-05-05 NOTE — Telephone Encounter (Signed)
Attempted to call patient left her a message.  Will hold off on refill for now unsure what she would like to discuss

## 2020-05-05 NOTE — Chronic Care Management (AMB) (Signed)
  Care Management   Follow Up Note   05/05/2020 Name: Amanda [name] MRN: [id number] DOB: 01/28/1951  Referred by: [name], MD Reason for referral : Care Coordination (Ramp referral)   Amanda [name] is a 69 y.o. year old female who is a primary care patient of [name], [name], MD. The care management team was consulted for assistance with care management and care coordination needs.    Review of patient status, including review of consultants reports, relevant laboratory and other test results, and collaboration with appropriate care team members and the patient's provider was performed as part of comprehensive patient evaluation and provision of chronic care management services.    SDOH (Social Determinants of Health) assessments performed: No See Care Plan activities for detailed interventions related to [facility])     Advanced Directives: See Care Plan and [employer] application for related entries.   Goals Addressed            This Visit's Progress   . "We really need a ramp for my husband" (pt-stated)       Current Barriers:  [name] Knowledge Barriers related to resources available for ramp due to spouse's physical limitations.  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    Case Manager Clinical Goal(s):  [name] Over the next 180 days, patient will work with BSW to address needs related to spouse's physical limitations/need for ramp.   . Over the next 180 days, BSW will collaborate with RN Care Manager to address care management and care coordination needs  Interventions:  [name] [place] with [facility]ational [facility] regarding status of referral for ramp assistance; awaiting response.   Patient Self Care Activities:  . Patient verbalizes understanding of plan to seek assistance through [facility] and Vocational Rehabilitation for ramp assistance.   . Self administers medications as  prescribed . Performs ADL's independently . Performs IADL's independently  Please see past updates related to this goal by clicking on the "Past Updates" button in the selected goal          The patient has been provided with contact information for the care management team and has been advised to call with any health related questions or concerns.    Ronn Melena, [place] Coordination Social Worker [place] [phone]

## 2020-05-05 NOTE — Telephone Encounter (Signed)
Amanda [name], will you please call pt, should we continue giving pt's insulin because they dont want to pay for part D medicare

## 2020-05-05 NOTE — Patient Instructions (Signed)
Visit Information  Goals Addressed            This Visit's Progress   . "We really need a ramp for my husband" (pt-stated)       Current Barriers:  [name] Knowledge Barriers related to resources available for ramp due to spouse's physical limitations.  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    Case Manager Clinical Goal(s):  [name] Over the next 180 days, patient will work with BSW to address needs related to spouse's physical limitations/need for ramp.   . Over the next 180 days, BSW will collaborate with RN Care Manager to address care management and care coordination needs  Interventions:  [name] [place] with Vocational Rehabilitation regarding status of referral for ramp assistance; awaiting response.   Patient Self Care Activities:  . Patient verbalizes understanding of plan to seek assistance through [facility] and Vocational Rehabilitation for ramp assistance.   . Self administers medications as prescribed . Performs ADL's independently . Performs IADL's independently  Please see past updates related to this goal by clicking on the "Past Updates" button in the selected goal               [employer], [place] Worker [place] [phone]

## 2020-05-06 ENCOUNTER — Telehealth: Payer: Medicare Other

## 2020-05-06 MED ORDER — OZEMPIC (0.25 OR 0.5 MG/DOSE) 2 MG/1.5ML ~~LOC~~ SOPN: 0.5000 mg | PEN_INJECTOR | SUBCUTANEOUS | 2 refills | Status: DC

## 2020-05-06 MED FILL — OZEMPIC 0.25 OR 0.5 MG/DOSE: 2 | 28 days supply | Qty: 2 | Fill #0

## 2020-05-06 NOTE — Telephone Encounter (Signed)
I will refill at [facility], not sure if she will be able to afford it or not.  Doesn't qualify for IM program.

## 2020-05-11 ENCOUNTER — Other Ambulatory Visit (HOSPITAL_COMMUNITY): Payer: Self-pay | Admitting: Ophthalmology

## 2020-05-11 ENCOUNTER — Telehealth: Payer: Medicare Other

## 2020-05-11 ENCOUNTER — Ambulatory Visit: Payer: Medicare Other | Admitting: *Deleted

## 2020-05-11 DIAGNOSIS — Z794 Long term (current) use of insulin: Secondary | ICD-10-CM

## 2020-05-11 DIAGNOSIS — I10 Essential (primary) hypertension: Secondary | ICD-10-CM

## 2020-05-11 DIAGNOSIS — E118 Type 2 diabetes mellitus with unspecified complications: Secondary | ICD-10-CM

## 2020-05-11 MED FILL — LATANOPROST 0.005% EYE DRP: 0.005 | 25 days supply | Qty: 3 | Fill #0

## 2020-05-11 NOTE — Patient Instructions (Addendum)
Visit Information It was nice speaking with you today I will ask Dr Amanda [name] his thoughts on taking a "Metformin vacation" and then get back with you.  Goals Addressed            This Visit's Progress     Patient Stated   . " I need to have my teeth checked because I have diabetes but my health insurance plan doesn't cover dental expenses. Can you help me?" (pt-stated)       CARE PLAN ENTRY (see longitudinal plan of care for additional care plan information)  Current Barriers:  . Care Coordination needs related to community dental care resources in a patient with DM, HTN, HDL.- patient again states she has not received a call from a care guide about dental care resources  Nurse Case Manager Clinical Goal(s):  [name] Over the next 30 days, patient will work with community care guide for dental care resources.  Interventions:  . Inter-disciplinary care team collaboration (see longitudinal plan of care) . Will re-refer to Care Guide for dental care resources  Patient Self Care Activities:  . Patient verbalizes understanding of plan to work with care guide to receive community dental care resources . Needs help with securing dental care resources  Please see past updates related to this goal by clicking on the "Past Updates" button in the selected goal      . " My blood sugar and blood pressure are better." (pt-stated)       CARE PLAN ENTRY (see longitudinal plan of care for additional care plan information)  Current Barriers:  . Chronic Disease Management support, education, and care coordination needs related to HTN, HLD, DMII, and CKD - patient states blood sugars and blood pressures have improved, she reports self monitored fasting blood sugars of 121-157, however she continues to struggle with urgent diarrhea and wonders if it's one of the medicines she is taking, she also says she still needs dental resources. She also asked about ongoing assistance with Ozempic  . Clinical Goal(s)  related to HTN, HLD, DMII, and CKD Stage. Over the next 30 days, patient will:  . Work with the care management team to address educational, disease management, and care coordination needs  . Begin or continue self health monitoring activities as directed today Measure and record CBG (blood glucose) 1-2 times daily and Measure and record blood pressure 5-7 times per week . Call provider office for new or worsened signs and symptoms Blood glucose findings outside established parameters, Blood pressure findings outside established parameters, and New or worsened symptom related to HTN, HLD, DMII, and chronic diarrhea . Call care management team with questions or concerns . Verbalize basic understanding of patient centered plan of care established today  Interventions related to HTN, HLD, DMII, and CKD. [name] Evaluation of current treatment plans and patient's adherence to plan as established by provider . Assessed patient understanding of disease states . Assessed patient's education and care coordination needs . Provided disease specific education to patient - discussed Metformin  "vacation" with provider approval to see the effect on her ongoing diarrhea- message sent to provider . Reviewed lifestyle strategies to help lower blood pressure including weight and stress management, reduce sodium intake and take medications as prescribed encouraged her to check her program activity benefits with her health insurance plan . Collaborated with appropriate clinical care team members regarding patient needs- Amanda [name] pharmacy technician about patient's desire for update on ongoing Ozempic assistance, will re-refer patient to care  guide regarding dental resources since her current health plan does not provide dental benefits   Patient Self Care Activities related to HTN, HLD, DMII, and CKD. [name] Patient is unable to independently self-manage chronic health conditions  Please see past updates related  to this goal by clicking on the "Past Updates" button in the selected goal         The patient verbalized understanding of instructions provided today and declined a print copy of patient instruction materials.   The care management team will reach out to the patient again over the next 7 days.   Amanda Churn RN, CCM, [facility] RN Care Manager [phone]

## 2020-05-11 NOTE — Chronic Care Management (AMB) (Signed)
Chronic Care Management   Follow Up Note   05/11/2020 Name: Amanda [name] MRN: [id number] DOB: 1951-11-08  Referred by: [name], MD Reason for referral : Chronic Care Management (DM, HTN)   Amanda [name] is a 69 y.o. year old female who is a primary care patient of [name], [name], MD. The CCM team was consulted for assistance with chronic disease management and care coordination needs.    Review of patient status, including review of consultants reports, relevant laboratory and other test results, and collaboration with appropriate care team members and the patient's provider was performed as part of comprehensive patient evaluation and provision of chronic care management services.    SDOH (Social Determinants of Health) assessments performed: No See Care Plan activities for detailed interventions related to [facility])     Outpatient Encounter Medications as of 05/11/2020  Medication Sig Note  . ACCU-CHEK AVIVA PLUS test strip USE AS DIRECTED UP TO FOUR TIMES DAILY   . Accu-Chek Softclix Lancets lancets USE AS DIRECTED UP TO FOUR TIMES DAILY   . acetaminophen (TYLENOL) 325 MG tablet Take 2 tablets (650 mg total) by mouth every 6 (six) hours as needed (or Fever >/= 101).   [name] amLODipine (NORVASC) 5 MG tablet TAKE 1 TABLET (5 MG TOTAL) BY MOUTH DAILY.   [name] atorvastatin (LIPITOR) 40 MG tablet Take 1 tablet (40 mg total) by mouth daily at 6 PM.   . blood glucose meter kit and supplies KIT Dispense based on patient and insurance preference. Use up to four times daily as directed. .   . Capsaicin-Menthol-Methyl Sal (CAPSAICIN-METHYL SAL-MENTHOL) 0.025-1-12 % CREA Apply 1 application topically as needed. Apply over painful areas   . cetirizine (ZYRTEC) 5 MG tablet Take 1 tablet (5 mg total) by mouth daily.   . Cholecalciferol (VITAMIN D3) 2000 units capsule Take by mouth daily.    . diclofenac Sodium (VOLTAREN) 1 % GEL Apply topically. 04/08/2020: Uses prn  . Ferrous Sulfate (IRON)  325 (65 Fe) MG TABS Take 1 tablet (325 mg total) by mouth daily.   . fluticasone (FLONASE) 50 MCG/ACT nasal spray Place 2 sprays into both nostrils daily.   [name] latanoprost (XALATAN) 0.005 % ophthalmic solution 1 drop at bedtime. 09/10/2019: LF 6.4.20 #2.5/30  . metFORMIN (GLUCOPHAGE-XR) 500 MG 24 hr tablet Take 2 tablets (1,000 mg total) by mouth 2 (two) times daily with a meal.   . Multiple Vitamins-Minerals (WOMENS 50+ MULTI VITAMIN/MIN) TABS Take 1 tablet by mouth daily.   . Semaglutide,0.25 or 0.5MG/DOS, (OZEMPIC, 0.25 OR 0.5 MG/DOSE,) 2 MG/1.5ML SOPN Inject 0.5 mg into the skin once a week.    No facility-administered encounter medications on file as of 05/11/2020.     Objective:   Goals Addressed            This Visit's Progress     Patient Stated   . " I need to have my teeth checked because I have diabetes but my health insurance plan doesn't cover dental expenses. Can you help me?" (pt-stated)       CARE PLAN ENTRY (see longitudinal plan of care for additional care plan information)  Current Barriers:  . Care Coordination needs related to community dental care resources in a patient with DM, HTN, HDL.- patient again states she has not received a call from a care guide about dental care resources  Nurse Case Manager Clinical Goal(s):  [name] Over the next 30 days, patient will work with community care guide for  dental care resources.  Interventions:  . Inter-disciplinary care team collaboration (see longitudinal plan of care) . Will re-refer to Care Guide for dental care resources  Patient Self Care Activities:  . Patient verbalizes understanding of plan to work with care guide to receive community dental care resources . Needs help with securing dental care resources  Please see past updates related to this goal by clicking on the "Past Updates" button in the selected goal      . " My blood sugar and blood pressure are better." (pt-stated)       CARE PLAN ENTRY (see  longitudinal plan of care for additional care plan information)  Current Barriers:  . Chronic Disease Management support, education, and care coordination needs related to HTN, HLD, DMII, and CKD - patient states blood sugars and blood pressures have improved, she reports self monitored fasting blood sugars of 121-157, however she continues to struggle with urgent diarrhea and wonders if it's one of the medicines she is taking, she also says she still needs dental resources. She also asked about ongoing assistance with Ozempic  . Clinical Goal(s) related to HTN, HLD, DMII, and CKD Stage. Over the next 30 days, patient will:  . Work with the care management team to address educational, disease management, and care coordination needs  . Begin or continue self health monitoring activities as directed today Measure and record CBG (blood glucose) 1-2 times daily and Measure and record blood pressure 5-7 times per week . Call provider office for new or worsened signs and symptoms Blood glucose findings outside established parameters, Blood pressure findings outside established parameters, and New or worsened symptom related to HTN, HLD, DMII, and chronic diarrhea . Call care management team with questions or concerns . Verbalize basic understanding of patient centered plan of care established today  Interventions related to HTN, HLD, DMII, and CKD. [name] Evaluation of current treatment plans and patient's adherence to plan as established by provider . Assessed patient understanding of disease states . Assessed patient's education and care coordination needs . Provided disease specific education to patient - discussed Metformin  "vacation" with provider approval to see the effect on her ongoing diarrhea- message sent to provider . Reviewed lifestyle strategies to help lower blood pressure including weight and stress management, reduce sodium intake and take medications as prescribed encouraged her to check  her program activity benefits with her health insurance plan . Collaborated with appropriate clinical care team members regarding patient needs- Geneva Simcox pharmacy technician about patient's desire for update on ongoing Ozempic assistance, will re-refer patient to care guide regarding dental resources since her current health plan does not provide dental benefits   Patient Self Care Activities related to HTN, HLD, DMII, and CKD. [name] Patient is unable to independently self-manage chronic health conditions  Please see past updates related to this goal by clicking on the "Past Updates" button in the selected goal          Plan:   The care management team will reach out to the patient again over the next 7 days.    [name] RN, CCM, [facility] RN Care Manager [phone]

## 2020-05-12 ENCOUNTER — Other Ambulatory Visit: Payer: Self-pay | Admitting: Pharmacy Technician

## 2020-05-12 ENCOUNTER — Other Ambulatory Visit: Payer: Self-pay | Admitting: Internal Medicine

## 2020-05-12 MED FILL — AMLODIPINE BESYLATE 5 MG TA: 5 | 90 days supply | Qty: 90 | Fill #0

## 2020-05-12 NOTE — Patient Outreach (Signed)
[facility]) Care Management  05/12/2020  BERYL HORNBERGER February 04, 1951 [id number]   Received in basket message from [place] embedded RN [name] requesting that I outreach patient again concerning the [employer] application that was mailed to patient on 03/30/2020 and patient confirmed receiving 04/07/2020.   Successful call placed to patient regarding patient assistance application(s) for Ozempic with [employer] , HIPAA identifiers verified.   Patient informed she mailed the application back a few weeks agao.  As of 05/12/2020 I have not received them.  It was agreed that another application would be mailed to patient today 05/12/2020.  Follow up:  Application was printed along with the letter and place in outgoing mail today. Courier picked up letter today  Will follow up with patient in 5-10 business days to confirm it was received.  Arsalan Brisbin P. Makendra Vigeant, [place]  [phone]

## 2020-05-12 NOTE — Progress Notes (Signed)
Internal Medicine Clinic Resident  I have personally reviewed this encounter including the documentation in this note and/or discussed this patient with the care management provider. I will address any urgent items identified by the care management provider and will communicate my actions to the patient's PCP. I have reviewed the patient's CCM visit with my supervising attending, Dr Narendra.  Geneen Dieter, MD 05/12/2020    

## 2020-05-13 ENCOUNTER — Telehealth: Payer: Medicare Other

## 2020-05-13 ENCOUNTER — Ambulatory Visit: Payer: Self-pay | Admitting: *Deleted

## 2020-05-13 DIAGNOSIS — I10 Essential (primary) hypertension: Secondary | ICD-10-CM

## 2020-05-13 DIAGNOSIS — Z794 Long term (current) use of insulin: Secondary | ICD-10-CM

## 2020-05-13 DIAGNOSIS — E118 Type 2 diabetes mellitus with unspecified complications: Secondary | ICD-10-CM

## 2020-05-13 NOTE — Progress Notes (Signed)
Internal Medicine Clinic Resident  I have personally reviewed this encounter including the documentation in this note and/or discussed this patient with the care management provider. I will address any urgent items identified by the care management provider and will communicate my actions to the patient's PCP. I have reviewed the patient's CCM visit with my supervising attending, Dr Vincent.  Nateisha Moyd, MD 05/13/2020    

## 2020-05-13 NOTE — Chronic Care Management (AMB) (Signed)
Chronic Care Management   Follow Up Note   05/13/2020 Name: Amanda [name] MRN: [id number] DOB: 04-11-1951  Referred by: [name], MD Reason for referral : Chronic Care Management (DM, HTN)   Amanda [name] is a 69 y.o. year old female who is a primary care patient of [name], [name], MD. The CCM team was consulted for assistance with chronic disease management and care coordination needs.    Review of patient status, including review of consultants reports, relevant laboratory and other test results, and collaboration with appropriate care team members and the patient's provider was performed as part of comprehensive patient evaluation and provision of chronic care management services.    SDOH (Social Determinants of Health) assessments performed: No See Care Plan activities for detailed interventions related to [facility])     Outpatient Encounter Medications as of 05/13/2020  Medication Sig Note  . ACCU-CHEK AVIVA PLUS test strip USE AS DIRECTED UP TO FOUR TIMES DAILY   . Accu-Chek Softclix Lancets lancets USE AS DIRECTED UP TO FOUR TIMES DAILY   . acetaminophen (TYLENOL) 325 MG tablet Take 2 tablets (650 mg total) by mouth every 6 (six) hours as needed (or Fever >/= 101).   [name] amLODipine (NORVASC) 5 MG tablet TAKE 1 TABLET (5 MG TOTAL) BY MOUTH DAILY.   [name] atorvastatin (LIPITOR) 40 MG tablet Take 1 tablet (40 mg total) by mouth daily at 6 PM.   . blood glucose meter kit and supplies KIT Dispense based on patient and insurance preference. Use up to four times daily as directed. .   . Capsaicin-Menthol-Methyl Sal (CAPSAICIN-METHYL SAL-MENTHOL) 0.025-1-12 % CREA Apply 1 application topically as needed. Apply over painful areas   . cetirizine (ZYRTEC) 5 MG tablet Take 1 tablet (5 mg total) by mouth daily.   . Cholecalciferol (VITAMIN D3) 2000 units capsule Take by mouth daily.    . diclofenac Sodium (VOLTAREN) 1 % GEL Apply topically. 04/08/2020: Uses prn  . Ferrous Sulfate (IRON)  325 (65 Fe) MG TABS Take 1 tablet (325 mg total) by mouth daily.   . fluticasone (FLONASE) 50 MCG/ACT nasal spray Place 2 sprays into both nostrils daily.   [name] latanoprost (XALATAN) 0.005 % ophthalmic solution 1 drop at bedtime. 09/10/2019: LF 6.4.20 #2.5/30  . metFORMIN (GLUCOPHAGE-XR) 500 MG 24 hr tablet Take 2 tablets (1,000 mg total) by mouth 2 (two) times daily with a meal.   . Multiple Vitamins-Minerals (WOMENS 50+ MULTI VITAMIN/MIN) TABS Take 1 tablet by mouth daily.   . Semaglutide,0.25 or 0.5MG/DOS, (OZEMPIC, 0.25 OR 0.5 MG/DOSE,) 2 MG/1.5ML SOPN Inject 0.5 mg into the skin once a week.    No facility-administered encounter medications on file as of 05/13/2020.     Objective:   Goals Addressed            This Visit's Progress     Patient Stated   . " My blood sugar and blood pressure are better." (pt-stated)       CARE PLAN ENTRY (see longitudinal plan of care for additional care plan information)  Current Barriers:  . Chronic Disease Management support, education, and care coordination needs related to HTN, HLD, DMII, and CKD - patient states blood sugars and blood pressures have improved, she reports self monitored fasting blood sugars of 121-157, however she continues to struggle with urgent diarrhea and wonders if it's one of the medicines she is taking, she also says she still needs dental resources. She also asked about ongoing assistance with Ozempic  .  Clinical Goal(s) related to HTN, HLD, DMII, and CKD Stage. Over the next 30 days, patient will:  . Work with the care management team to address educational, disease management, and care coordination needs  . Begin or continue self health monitoring activities as directed today Measure and record CBG (blood glucose) 1-2 times daily and Measure and record blood pressure 5-7 times per week . Call provider office for new or worsened signs and symptoms Blood glucose findings outside established parameters, Blood pressure  findings outside established parameters, and New or worsened symptom related to HTN, HLD, DMII, and chronic diarrhea . Call care management team with questions or concerns . Verbalize basic understanding of patient centered plan of care established today  Interventions related to HTN, HLD, DMII, and CKD. [name] Left message for patient advising she can try Metformin vacation per Dr Shan Levans to see if helps her intermittent urgent diarrhea. . Left message for patient advising her  there are no more Ozempic samples available from the clinic and that this CCM RN will notify clinic pharmacy technician Cheryle Horsfall regarding assisting her with Ozempic.  Patient Self Care Activities related to HTN, HLD, DMII, and CKD. [name] Patient is unable to independently self-manage chronic health conditions  Please see past updates related to this goal by clicking on the "Past Updates" button in the selected goal          Plan:   The care management team will reach out to the patient again over the next 30 days.    [name] RN, CCM, [facility] RN Care Manager [phone]

## 2020-05-14 ENCOUNTER — Ambulatory Visit: Payer: Medicare Other

## 2020-05-14 DIAGNOSIS — E113513 Type 2 diabetes mellitus with proliferative diabetic retinopathy with macular edema, bilateral: Secondary | ICD-10-CM

## 2020-05-14 DIAGNOSIS — Z794 Long term (current) use of insulin: Secondary | ICD-10-CM

## 2020-05-14 DIAGNOSIS — I10 Essential (primary) hypertension: Secondary | ICD-10-CM

## 2020-05-14 NOTE — Chronic Care Management (AMB) (Deleted)
  Care Management   Follow Up Note   05/14/2020 Name: CARNELLA FRYMAN MRN: [id number] DOB: February 28, 1951  Referred by: [name], MD Reason for referral : Care Coordination (Ramp resources)   AHLIA LEMANSKI is a 69 y.o. year old female who is a primary care patient of [name], [name], MD. The care management team was consulted for assistance with care management and care coordination needs.    Review of patient status, including review of consultants reports, relevant laboratory and other test results, and collaboration with appropriate care team members and the patient's provider was performed as part of comprehensive patient evaluation and provision of chronic care management services.    SDOH (Social Determinants of Health) assessments performed: No See Care Plan activities for detailed interventions related to [facility])     Advanced Directives: See Care Plan and [employer] application for related entries.   Goals Addressed            This Visit's Progress   . "We really need a ramp for my husband" (pt-stated)       Current Barriers:  [name] Knowledge Barriers related to resources available for ramp due to spouse's physical limitations.  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    Case Manager Clinical Goal(s):  [name] Over the next 180 days, patient will work with BSW to address needs related to spouse's physical limitations/need for ramp.   . Over the next 180 days, BSW will collaborate with RN Care Manager to address care management and care coordination needs  Interventions:  . Second message sent to [facility] with Vocational Rehabilitation regarding status of referral for ramp.  No response to message sent on 05/14/20 . Informed patient that follow up message was sent to [name] as patient has not yet been contacted regarding referral . Reminded patient about [place] as other possible resource for ramp assistance.   . Reminded patient that referral to [facility] must be submitted by her . Provided patient with contact information for [place] [facility] again and encouraged her to call  Patient Self Care Activities:  . Patient verbalizes understanding of plan to seek assistance through [facility] and Vocational Rehabilitation for ramp assistance.   . Self administers medications as prescribed . Performs ADL's independently . Performs IADL's independently  Please see past updates related to this goal by clicking on the "Past Updates" button in the selected goal          The patient has been provided with contact information for the care management team and has been advised to call with any health related questions or concerns.     Ronn Melena, [place] Coordination Social Worker [place] [phone]

## 2020-05-14 NOTE — Chronic Care Management (AMB) (Signed)
  Care Management   Follow Up Note   05/14/2020 Name: Amanda [name] MRN: [id number] DOB: 17-Nov-1951  Referred by: [name], MD Reason for referral : Care Coordination (Ramp resources)   Amanda [name] is a 69 y.o. year old female who is a primary care patient of [name], [name], MD. The care management team was consulted for assistance with care management and care coordination needs.    Review of patient status, including review of consultants reports, relevant laboratory and other test results, and collaboration with appropriate care team members and the patient's provider was performed as part of comprehensive patient evaluation and provision of chronic care management services.    SDOH (Social Determinants of Health) assessments performed: No See Care Plan activities for detailed interventions related to [facility])     Advanced Directives: See Care Plan and Vyn[employer] application for related entries.   Goals Addressed            This Visit's Progress   . "We really need a ramp for my husband" (pt-stated)       Current Barriers:  [name] Knowledge Barriers related to resources available for ramp due to spouse's physical limitations.  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    Case Manager Clinical Goal(s):  [name] Over the next 180 days, patient will work with BSW to address needs related to spouse's physical limitations/need for ramp.   . Over the next 180 days, BSW will collaborate with RN Care Manager to address care management and care coordination needs  Interventions:  . Collaborated with [employer] from [place].  Patient has been contacted regarding referral for ramp assistance and paperwork was sent to her.   . Reminded patient about [facility] as other possible resource for ramp assistance.   . Reminded patient that referral to [facility] must be submitted  by her . Provided patient with contact information for [place] [facility] again and encouraged her to call  Patient Self Care Activities:  . Patient verbalizes understanding of plan to seek assistance through [facility] and Vocational Rehabilitation for ramp assistance.   . Self administers medications as prescribed . Performs ADL's independently . Performs IADL's independently  Please see past updates related to this goal by clicking on the "Past Updates" button in the selected goal          The patient has been provided with contact information for the care management team and has been advised to call with any health related questions or concerns.    Ronn Melena, [place] Coordination Social Worker [place] [phone]

## 2020-05-14 NOTE — Patient Instructions (Signed)
Visit Information  Goals Addressed            This Visit's Progress   . "We really need a ramp for my husband" (pt-stated)       Current Barriers:  [name] Knowledge Barriers related to resources available for ramp due to spouse's physical limitations.  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    Case Manager Clinical Goal(s):  [name] Over the next 180 days, patient will work with BSW to address needs related to spouse's physical limitations/need for ramp.   . Over the next 180 days, BSW will collaborate with RN Care Manager to address care management and care coordination needs  Interventions:  . Second message sent to [facility] with Vocational Rehabilitation regarding status of referral for ramp.  No response to message sent on 05/14/20 . Informed patient that follow up message was sent to Amanda [name] as patient has not yet been contacted regarding referral . Reminded patient about [place] as other possible resource for ramp assistance.   . Reminded patient that referral to [facility] must be submitted by her . Provided patient with contact information for [facility] again and encouraged her to call  Patient Self Care Activities:  . Patient verbalizes understanding of plan to seek assistance through [facility] and Vocational Rehabilitation for ramp assistance.   . Self administers medications as prescribed . Performs ADL's independently . Performs IADL's independently  Please see past updates related to this goal by clicking on the "Past Updates" button in the selected goal         Patient verbalizes understanding of instructions provided today.   The patient has been provided with contact information for the care management team and has been advised to call with any health related questions or concerns.     Amanda [name], [place] Coordination Social Worker [place] [phone]

## 2020-05-14 NOTE — Progress Notes (Signed)
Internal Medicine Clinic Resident  I have personally reviewed this encounter including the documentation in this note and/or discussed this patient with the care management provider. I will address any urgent items identified by the care management provider and will communicate my actions to the patient's PCP. I have reviewed the patient's CCM visit with my supervising attending, Dr Narendra.  Lorine Iannaccone, MD 05/14/2020    

## 2020-05-14 NOTE — Progress Notes (Signed)
Internal Medicine Clinic Attending  CCM services provided by the care management provider and their documentation were discussed with Dr. Chundi. We reviewed the pertinent findings, urgent action items addressed by the resident and non-urgent items to be addressed by the PCP.  I agree with the assessment, diagnosis, and plan of care documented in the CCM and resident's note.  Deonte Otting Thomas Ismael Treptow, MD 05/14/2020  

## 2020-05-14 NOTE — Progress Notes (Signed)
Internal Medicine Clinic Attending  CCM services provided by the care management provider and their documentation were discussed with Dr. Chundi. We reviewed the pertinent findings, urgent action items addressed by the resident and non-urgent items to be addressed by the PCP.  I agree with the assessment, diagnosis, and plan of care documented in the CCM and resident's note.  Ange Puskas, MD 05/14/2020  

## 2020-05-20 ENCOUNTER — Other Ambulatory Visit: Payer: Self-pay | Admitting: Internal Medicine

## 2020-05-20 DIAGNOSIS — E785 Hyperlipidemia, unspecified: Secondary | ICD-10-CM

## 2020-05-20 MED FILL — AMLODIPINE BESYLATE 5 MG TA: 5 | 90 days supply | Qty: 90 | Fill #0

## 2020-05-20 MED FILL — LATANOPROST 0.005% EYE DRP: 0.005 | 25 days supply | Qty: 3 | Fill #0

## 2020-05-21 ENCOUNTER — Telehealth: Payer: Medicare Other

## 2020-05-21 ENCOUNTER — Other Ambulatory Visit: Payer: Self-pay | Admitting: Pharmacy Technician

## 2020-05-21 MED FILL — ATORVASTATIN 40 MG TABLET: 40 | 90 days supply | Qty: 90 | Fill #0

## 2020-05-21 NOTE — Patient Outreach (Signed)
[facility]) Care Management  05/21/2020  Amanda [name] 09-Jul-1951 [id number]   Successful call placed to patient regarding patient assistance application(s) for Ozempic with [employer] , HIPAA identifiers verified.   Patient informs she has not received the application that was re mailed to her on 05/12/2020. Informed patient I would check back with her next week on 05/26/2020 and if it has not been received then we could possibly arrange a date/time for her to meet me in person to sign the application and receive the necessary documentation needed to submit to [employer]. Patient was agreeable to this plan.  Follow up:  Will follow up with patient in 3-5 buisness days.  Janitza Revuelta P. Cherye Gaertner, [place]  [phone]

## 2020-05-26 ENCOUNTER — Other Ambulatory Visit: Payer: Self-pay | Admitting: Pharmacy Technician

## 2020-05-26 ENCOUNTER — Other Ambulatory Visit: Payer: Self-pay | Admitting: Internal Medicine

## 2020-05-26 DIAGNOSIS — Z794 Long term (current) use of insulin: Secondary | ICD-10-CM

## 2020-05-26 DIAGNOSIS — E118 Type 2 diabetes mellitus with unspecified complications: Secondary | ICD-10-CM

## 2020-05-26 NOTE — Patient Outreach (Signed)
[facility]) Care Management  05/26/2020  Amanda [name] [birth year] [id number]    Successful call placed to patient regarding patient assistance application(s) for Ozempic with [employer] , HIPAA identifiers verified.   Patient informed she did receive the application yesterday. She informed that she has a busy day of appointments planned today but is planning on completing the application and placing it in the mail back to me no later than Thursday 05/28/2020.  Follow up:  Will route note to [place] for case closure if document(s) have not been received in the next 15 business days.  Kayal Mula P. Doyce Saling, [place]  [phone]

## 2020-05-27 ENCOUNTER — Other Ambulatory Visit: Payer: Self-pay | Admitting: Internal Medicine

## 2020-05-29 ENCOUNTER — Telehealth: Payer: Self-pay | Admitting: Internal Medicine

## 2020-05-29 ENCOUNTER — Telehealth: Payer: Self-pay | Admitting: *Deleted

## 2020-05-29 NOTE — Telephone Encounter (Signed)
Pt is working w/ jill simcox to assist w/ ozempic, copays are too expensive at $142.00. Just FYI in case pt speaks to [place].

## 2020-05-29 NOTE — Telephone Encounter (Signed)
  Community Resource Referral   [place] 05/29/2020  DOB: 1951-03-18   AGE: 69 y.o.   GENDER: female   PCP [name], [name], MD.   Called pt regarding Community Resource Referral she stated that she did get the letter and she remembered speaking with me regarding the dental resources. Asked if there was anything she needed further and she stated no but that she was very thankful for our team for checking in on her. Closing referral pending any other needs of patient. [place] . [email or web address]  [phone]

## 2020-05-29 NOTE — Telephone Encounter (Signed)
Thanks for the update

## 2020-06-02 ENCOUNTER — Telehealth: Payer: Self-pay | Admitting: *Deleted

## 2020-06-05 ENCOUNTER — Ambulatory Visit: Payer: Medicare Other

## 2020-06-05 DIAGNOSIS — E118 Type 2 diabetes mellitus with unspecified complications: Secondary | ICD-10-CM

## 2020-06-05 DIAGNOSIS — I10 Essential (primary) hypertension: Secondary | ICD-10-CM

## 2020-06-05 NOTE — Progress Notes (Signed)
Internal Medicine Clinic Resident  I have personally reviewed this encounter including the documentation in this note and/or discussed this patient with the care management provider. I will address any urgent items identified by the care management provider and will communicate my actions to the patient's PCP. I have reviewed the patient's CCM visit with my supervising attending, Dr Philipp Ovens.  Dewayne Hatch, MD 06/05/2020

## 2020-06-05 NOTE — Patient Instructions (Signed)
Visit Information  Goals Addressed              This Visit's Progress   .  "We really need a ramp for my husband" (pt-stated)        Current Barriers:  [name] Knowledge Barriers related to resources available for ramp due to spouse's physical limitations.  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    Case Manager Clinical Goal(s):  [name] Over the next 180 days, patient will work with BSW to address needs related to spouse's physical limitations/need for ramp.   . Over the next 180 days, BSW will collaborate with RN Care Manager to address care management and care coordination needs  Interventions:  . Collaborated with Tor Netters from [place] regarding status of referral.  Waiting for family to return application packet . Contacted patient to ensure receipt of application packet.  Patient confirmed receipt.  Encouraged her to complete and return to [place] as soon as possible as they cannot proceed with referral until received.   Patient Self Care Activities:  . Patient verbalizes understanding of plan to seek assistance through [facility] and Vocational Rehabilitation for ramp assistance.   . Self administers medications as prescribed . Performs ADL's independently . Performs IADL's independently  Please see past updates related to this goal by clicking on the "Past Updates" button in the selected goal         Patient verbalizes understanding of instructions provided today.   The patient has been provided with contact information for the care management team and has been advised to call with any health related questions or concerns.     Ronn Melena, [place] Coordination Social Worker [place] [phone]

## 2020-06-05 NOTE — Chronic Care Management (AMB) (Signed)
  Care Management   Follow Up Note   06/05/2020 Name: Amanda [name] MRN: [id number] DOB: 06-02-1951  Referred by: Amanda Roan, MD Reason for referral : Care Coordination ([place])   Amanda [name] is a 69 y.o. year old female who is a primary care patient of [name], [name], MD. The care management team was consulted for assistance with care management and care coordination needs.    Review of patient status, including review of consultants reports, relevant laboratory and other test results, and collaboration with appropriate care team members and the patient's provider was performed as part of comprehensive patient evaluation and provision of chronic care management services.    SDOH (Social Determinants of Health) assessments performed: No See Care Plan activities for detailed interventions related to [facility])     Advanced Directives: See Care Plan and [employer] application for related entries.   Goals Addressed              This Visit's Progress   .  "We really need a ramp for my husband" (pt-stated)        Current Barriers:  [name] Knowledge Barriers related to resources available for ramp due to spouse's physical limitations.  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    Case Manager Clinical Goal(s):  [name] Over the next 180 days, patient will work with BSW to address needs related to spouse's physical limitations/need for ramp.   . Over the next 180 days, BSW will collaborate with RN Care Manager to address care management and care coordination needs  Interventions:  . Collaborated with Tor Netters from [place] regarding status of referral.  Waiting for family to return application packet . Contacted patient to ensure receipt of application packet.  Patient confirmed receipt.  Encouraged her to complete and return to [place] as soon as possible as they  cannot proceed with referral until received.   Patient Self Care Activities:  . Patient verbalizes understanding of plan to seek assistance through [facility] and Vocational Rehabilitation for ramp assistance.   . Self administers medications as prescribed . Performs ADL's independently . Performs IADL's independently  Please see past updates related to this goal by clicking on the "Past Updates" button in the selected goal          The patient has been provided with contact information for the care management team and has been advised to call with any health related questions or concerns.      Amanda [name], [place] Coordination Social Worker [place] [phone]

## 2020-06-08 NOTE — Progress Notes (Signed)
Internal Medicine Clinic Attending ° °CCM services provided by the care management provider and their documentation were discussed with Dr. Masoudi. We reviewed the pertinent findings, urgent action items addressed by the resident and non-urgent items to be addressed by the PCP.  I agree with the assessment, diagnosis, and plan of care documented in the CCM and resident's note. ° °Jamelle Noy, MD °06/08/2020 ° °

## 2020-06-09 ENCOUNTER — Ambulatory Visit: Payer: Medicare Other | Admitting: *Deleted

## 2020-06-09 DIAGNOSIS — E113513 Type 2 diabetes mellitus with proliferative diabetic retinopathy with macular edema, bilateral: Secondary | ICD-10-CM

## 2020-06-09 DIAGNOSIS — I10 Essential (primary) hypertension: Secondary | ICD-10-CM

## 2020-06-09 NOTE — Patient Instructions (Signed)
Visit Information It was nice speaking with you today. I will contact Amanda [name] to see if you can receive ongoing copay assistance with Ozempic.  Goals Addressed              This Visit's Progress     Patient Stated   .  COMPLETED: " I need to have my teeth checked because I have diabetes but my health insurance plan doesn't cover dental expenses. Can you help me?" (pt-stated)        CARE PLAN ENTRY (see longitudinal plan of care for additional care plan information)  Current Barriers:  . Care Coordination needs related to community dental care resources in a patient with DM, HTN, HDL.- per care guide note of 05/29/20 patient has received dental care resources  Nurse Case Manager Clinical Goal(s):  [name] Over the next 30 days, patient will work with community care guide for dental care resources.  Interventions:  . Inter-disciplinary care team collaboration (see longitudinal plan of care) . Completing goal as of today as patient has received the dental resources from the care guide  Patient Self Care Activities:  . Patient verbalizes understanding of plan to work with care guide to receive community dental care resources . Needs help with securing dental care resources  Please see past updates related to this goal by clicking on the "Past Updates" button in the selected goal      .  " My blood sugar is running higher since I have not had any Ozempic for the last 2 weeks; my blood pressure is OK" (pt-stated)        CARE PLAN ENTRY (see longitudinal plan of care for additional care plan information)  Current Barriers:  . Chronic Disease Management support, education, and care coordination needs related to HTN, HLD, DMII, and CKD - patient states blood sugars are higher as she has not had any Ozempic for the last 2 weeks, she cannot afford the copay, she says she did complete the [employer] form after she received a call from [employer] on 6/8 and mailed it back to [place], she  reports the following fasting, premeal  and hs values: 173, 196, 201, 153 and blood pressures have improved, she says she did not stop the Metformin to see if her diarrhea would improve since she is out of Ozempic  . Clinical Goal(s) related to HTN, HLD, DMII, and CKD Stage. Over the next 30- 60 days, patient will:  . Work with the care management team to address educational, disease management, and care coordination needs  . Begin or continue self health monitoring activities as directed today Measure and record CBG (blood glucose) 1-2 times daily and Measure and record blood pressure 5-7 times per week . Call provider office for new or worsened signs and symptoms Blood glucose findings outside established parameters, Blood pressure findings outside established parameters, and New or worsened symptom related to HTN, HLD, DMII, and chronic diarrhea . Call care management team with questions or concerns . Verbalize basic understanding of patient centered plan of care established today  Interventions related to HTN, HLD, DMII, and CKD. [name] Appropriate assessments completed  . Will message [name] to see if she received the completed [employer] form from patient for assistance with Ozempic. If unable to help patient with copay will message provider about change from Ozempic to another pharmaceutical treatment option   Patient Self Care Activities related to HTN, HLD, DMII, and CKD. [name] Patient is unable to  independently self-manage chronic health conditions  Please see past updates related to this goal by clicking on the "Past Updates" button in the selected goal         The patient verbalized understanding of instructions provided today and declined a print copy of patient instruction materials.   The care management team will reach out to the patient again over the next 30-60 days.   [name] RN, CCM, [facility] RN Care Manager [phone]

## 2020-06-09 NOTE — Progress Notes (Signed)
Internal Medicine Clinic Resident  Plan: - Urine microalbumin/creatinine ratio due in October 2021 - If unable to arrange Ozempic, please have patient schedule ACC visit to address diabetes medications   I have personally reviewed this encounter including the documentation in this note and/or discussed this patient with the care management provider. I will address any urgent items identified by the care management provider and will communicate my actions to the patient's PCP. I have reviewed the patient's CCM visit with my supervising attending, Dr Philipp Ovens.  Amanda Persia, MD 06/09/2020

## 2020-06-09 NOTE — Chronic Care Management (AMB) (Signed)
Chronic Care Management   Follow Up Note   06/09/2020 Name: Amanda [name] MRN: [id number] DOB: 1951/04/17  Referred by: Amanda Roan, MD Reason for referral : Chronic Care Management (DM, HTN)   Amanda [name] is a 69 y.o. year old female who is a primary care patient of [name], [name], MD. Amanda CCM team was consulted for assistance with chronic disease management and care coordination needs.    Review of patient status, including review of consultants reports, relevant laboratory and other test results, and collaboration with appropriate care team members and Amanda patient's provider was performed as part of comprehensive patient evaluation and provision of chronic care management services.    SDOH (Social Determinants of Health) assessments performed: No See Care Plan activities for detailed interventions related to [facility])     Outpatient Encounter Medications as of 06/09/2020  Medication Sig Note  . ACCU-CHEK AVIVA PLUS test strip USE AS DIRECTED UP TO FOUR TIMES DAILY   . Accu-Chek Softclix Lancets lancets USE AS DIRECTED UP TO FOUR TIMES DAILY   . acetaminophen (TYLENOL) 325 MG tablet Take 2 tablets (650 mg total) by mouth every 6 (six) hours as needed (or Fever >/= 101).   [name] amLODipine (NORVASC) 5 MG tablet TAKE 1 TABLET (5 MG TOTAL) BY MOUTH DAILY.   [name] atorvastatin (LIPITOR) 40 MG tablet TAKE 1 TABLET (40 MG TOTAL) BY MOUTH DAILY AT 6 PM.   . blood glucose meter kit and supplies KIT Dispense based on patient and insurance preference. Use up to four times daily as directed. .   . Capsaicin-Menthol-Methyl Sal (CAPSAICIN-METHYL SAL-MENTHOL) 0.025-1-12 % CREA Apply 1 application topically as needed. Apply over painful areas   . cetirizine (ZYRTEC) 5 MG tablet Take 1 tablet (5 mg total) by mouth daily.   . Cholecalciferol (VITAMIN D3) 2000 units capsule Take by mouth daily.    . diclofenac Sodium (VOLTAREN) 1 % GEL Apply topically. 04/08/2020: Uses prn  . Ferrous Sulfate (IRON)  325 (65 Fe) MG TABS Take 1 tablet (325 mg total) by mouth daily.   . fluticasone (FLONASE) 50 MCG/ACT nasal spray Place 2 sprays into both nostrils daily.   [name] latanoprost (XALATAN) 0.005 % ophthalmic solution 1 drop at bedtime. 09/10/2019: LF 6.4.20 #2.5/30  . metFORMIN (GLUCOPHAGE-XR) 500 MG 24 hr tablet TAKE 2 TABLETS (1,000 MG TOTAL) BY MOUTH 2 (TWO) TIMES DAILY WITH A MEAL.   . Multiple Vitamins-Minerals (WOMENS 50+ MULTI VITAMIN/MIN) TABS Take 1 tablet by mouth daily.   . Semaglutide,0.25 or 0.5MG/DOS, (OZEMPIC, 0.25 OR 0.5 MG/DOSE,) 2 MG/1.5ML SOPN Inject 0.5 mg into Amanda skin once a week. States she has been out of Ozempic for 2 weeks, can't afford copay   No facility-administered encounter medications on file as of 06/09/2020.     Objective:  Lab Results  Component Value Date   HGBA1C 6.3 (A) 03/23/2020   HGBA1C 7.6 (A) 12/24/2019   HGBA1C 10.9 (H) 09/09/2019   Lab Results  Component Value Date   MICROALBUR 18.4 (H) 06/16/2015   LDLCALC 54 09/10/2019   CREATININE 1.19 (H) 12/24/2019   Lab Results  Component Value Date   LABMICR 577.1 09/30/2019   LABMICR 217.5 07/24/2018   MICROALBUR 18.4 (H) 06/16/2015   MICROALBUR 41.36 (H) 05/01/2014  needs updated urine for protein   BP Readings from Last 3 Encounters:  03/23/20 (!) 157/86  12/24/19 110/75  11/11/19 130/76   Wt Readings from Last 3 Encounters:  03/23/20 116 lb 1.6 oz (52.7  kg)  12/24/19 113 lb 8 oz (51.5 kg)  11/11/19 116 lb 8 oz (52.8 kg)    Goals Addressed              This Visit's Progress     Patient Stated   .  COMPLETED: " I need to have my teeth checked because I have diabetes but my health insurance plan doesn't cover dental expenses. Can you help me?" (pt-stated)        CARE PLAN ENTRY (see longitudinal plan of care for additional care plan information)  Current Barriers:  . Care Coordination needs related to community dental care resources in a patient with DM, HTN, HDL.- per care guide note  of 05/29/20 patient has received dental care resources  Nurse Case Manager Clinical Goal(s):  [name] Over Amanda next 30 days, patient will work with community care guide for dental care resources.  Interventions:  . Inter-disciplinary care team collaboration (see longitudinal plan of care) . Completing goal as of today as patient has received Amanda dental resources from Amanda care guide  Patient Self Care Activities:  . Patient verbalizes understanding of plan to work with care guide to receive community dental care resources . Needs help with securing dental care resources  Please see past updates related to this goal by clicking on Amanda "Past Updates" button in Amanda selected goal      .  " My blood sugar is running higher since I have not had any Ozempic for Amanda last 2 weeks; my blood pressure is OK" (pt-stated)        CARE PLAN ENTRY (see longitudinal plan of care for additional care plan information)  Current Barriers:  . Chronic Disease Management support, education, and care coordination needs related to HTN, HLD, DMII, and CKD - patient states blood sugars are higher as she has not had any Ozempic for Amanda last 2 weeks, she cannot afford Amanda copay, she says she did complete Amanda Amanda [name] form after she received a call from Amanda [name] on 6/8 and mailed it back to Amanda [name], she reports Amanda following fasting, premeal  and hs values: 173, 196, 201, 153 and blood pressures have improved, she says she did not stop Amanda Metformin to see if her diarrhea would improve since she is out of Ozempic  . Clinical Goal(s) related to HTN, HLD, DMII, and CKD Stage. Over Amanda next 30- 60 days, patient will:  . Work with Amanda care management team to address educational, disease management, and care coordination needs  . Begin or continue self health monitoring activities as directed today Measure and record CBG (blood glucose) 1-2 times daily and Measure and record blood pressure 5-7 times per week . Call provider office  for new or worsened signs and symptoms Blood glucose findings outside established parameters, Blood pressure findings outside established parameters, and New or worsened symptom related to HTN, HLD, DMII, and chronic diarrhea . Call care management team with questions or concerns . Verbalize basic understanding of patient centered plan of care established today  Interventions related to HTN, HLD, DMII, and CKD. [name] Appropriate assessments completed  . Will message [name] to see if she received Amanda completed [employer] form from patient for assistance with Ozempic. If unable to assist patient with copay , will message provider about change from Ozempic to another pharmaceutical DM treatment  Patient Self Care Activities related to HTN, HLD, DMII, and CKD. [name] Patient is unable to independently self-manage chronic health conditions  Please see past updates related to this goal by clicking on Amanda "Past Updates" button in Amanda selected goal          Plan:   Amanda care management team will reach out to Amanda patient again over Amanda next 30-60 days.    [name] RN, CCM, [facility] RN Care Manager [phone]

## 2020-06-09 NOTE — Progress Notes (Signed)
Internal Medicine Clinic Attending  CCM services provided by the care management provider and their documentation were discussed with Dr. Charleen Kirks. We reviewed the pertinent findings, urgent action items addressed by the resident and non-urgent items to be addressed by the PCP.  I agree with the assessment, diagnosis, and plan of care documented in the CCM and resident's note.  Velna Ochs, MD 06/09/2020

## 2020-06-10 ENCOUNTER — Encounter: Payer: Self-pay | Admitting: Gastroenterology

## 2020-06-10 ENCOUNTER — Other Ambulatory Visit (INDEPENDENT_AMBULATORY_CARE_PROVIDER_SITE_OTHER): Payer: Medicare Other

## 2020-06-10 ENCOUNTER — Ambulatory Visit: Payer: Medicare Other | Admitting: Gastroenterology

## 2020-06-10 VITALS — BP 152/84 | HR 84 | Ht 61.5 in | Wt 119.1 lb

## 2020-06-10 DIAGNOSIS — K529 Noninfective gastroenteritis and colitis, unspecified: Secondary | ICD-10-CM | POA: Insufficient documentation

## 2020-06-10 HISTORY — DX: Noninfective gastroenteritis and colitis, unspecified: K52.9

## 2020-06-10 LAB — BASIC METABOLIC PANEL
BUN: 32 mg/dL — ABNORMAL HIGH (ref 6–23)
CO2: 28 mEq/L (ref 19–32)
Calcium: 9.5 mg/dL (ref 8.4–10.5)
Chloride: 105 mEq/L (ref 96–112)
Creatinine, Ser: 1.08 mg/dL (ref 0.40–1.20)
GFR: 61.07 mL/min (ref >60.00)
Glucose, Bld: 217 mg/dL — ABNORMAL HIGH (ref 70–99)
Potassium: 4.5 mEq/L (ref 3.5–5.1)
Sodium: 138 mEq/L (ref 135–145)

## 2020-06-10 LAB — TSH: TSH: 2.33 u[IU]/mL (ref 0.35–4.50)

## 2020-06-10 LAB — IGA: IgA: 283 mg/dL (ref 68–378)

## 2020-06-10 NOTE — Progress Notes (Signed)
06/10/2020 Amanda [name] [id number] May 26, 1951   HISTORY OF PRESENT ILLNESS: This is a 69 year old female who is a patient of Dr. Ardis Hughs. She presents here today with complaints of altered bowel habits. Reports issues with diarrhea that are very distressing to her. Says that for the most part she has diarrhea that can range from anywhere from 1-6 times a day. Sometimes the diarrhea really drains her. She does use Imodium and sometimes will take several, which then sounds like maybe it constipates her and she will go without a bowel movement for several days.  She is on Metformin 1000 mg twice daily. She says that overall she has been on some dose of Metformin for probably about 6 years. She is also on Ozempic for the past 1.5 to 2 years.  She had a colonoscopy in January 2020 for the same complaints at which time she had a 1 mm polyp removed that was just polypoid colonic mucosa on pathology. Random colonic biopsies were normal, negative for microscopic colitis.  She denies any rectal bleeding. She reports that her abdomen just stays sore. She says that she does not like to go to eat anywhere for fear of having to have diarrhea. Her weight is down about 7 pounds from when she was seen here in December 2019, but she says that she was actually down to 109 pounds so has gained about 10 pounds recently.  She says that Dr. Ardis Hughs recommended Metamucil powder in the past, and she did that for extensive period of time, but she does not feel like that helps significantly and then just gets tired of drinking it says she does not do it daily at this point.  Past Medical History:  Diagnosis Date  . Allergy    SEASONAL  . Anemia   . Arthritis   . Cataract    PRESENT IN LEFT/ CATARACTS REMOVED FROM RIGHT  . Diabetes mellitus 1996  . DKA (diabetic ketoacidoses) ([place]) 01/02/2013  . Hyperlipidemia   . Hypertension   . Hypotension   . Influenza A (H1N1) 01/03/2013  . Neuromuscular disorder ([place])     NEUROPATHY  . SYNCOPE 05/10/2010   Qualifier: Diagnosis of  By: Marilynne Halsted RN, BSN, Jacquelyn     Past Surgical History:  Procedure Laterality Date  . ABDOMINAL HYSTERECTOMY     10 years ago   . BREAST BIOPSY    . Cataracts    . ROTATOR CUFF REPAIR    . SHOULDER SURGERY     Rotator cuff tear     reports that she has never smoked. She has never used smokeless tobacco. She reports that she does not drink alcohol and does not use drugs. family history includes Aneurysm in her father; Breast cancer in her mother and sister; Diabetes in her mother and sister; Hypertension in her mother and sister; Kidney disease in her sister. Allergies  Allergen Reactions  . Gabapentin Other (See Comments)    Pt states medication made her feel "out of it".   Pt states medication made her feel "out of it".        Outpatient Encounter Medications as of 06/10/2020  Medication Sig  . ACCU-CHEK AVIVA PLUS test strip USE AS DIRECTED UP TO FOUR TIMES DAILY  . Accu-Chek Softclix Lancets lancets USE AS DIRECTED UP TO FOUR TIMES DAILY  . acetaminophen (TYLENOL) 325 MG tablet Take 2 tablets (650 mg total) by mouth every 6 (six) hours as needed (or Fever >/= 101).  [name] amLODipine (  NORVASC) 5 MG tablet TAKE 1 TABLET (5 MG TOTAL) BY MOUTH DAILY.  [name] atorvastatin (LIPITOR) 40 MG tablet TAKE 1 TABLET (40 MG TOTAL) BY MOUTH DAILY AT 6 PM.  . blood glucose meter kit and supplies KIT Dispense based on patient and insurance preference. Use up to four times daily as directed. .  . Capsaicin-Menthol-Methyl Sal (CAPSAICIN-METHYL SAL-MENTHOL) 0.025-1-12 % CREA Apply 1 application topically as needed. Apply over painful areas  . cetirizine (ZYRTEC) 5 MG tablet Take 1 tablet (5 mg total) by mouth daily.  . Cholecalciferol (VITAMIN D3) 2000 units capsule Take by mouth daily.   . diclofenac Sodium (VOLTAREN) 1 % GEL Apply topically.  . Ferrous Sulfate (IRON) 325 (65 Fe) MG TABS Take 1 tablet (325 mg total) by mouth daily.  . fluticasone  (FLONASE) 50 MCG/ACT nasal spray Place 2 sprays into both nostrils daily.  [name] latanoprost (XALATAN) 0.005 % ophthalmic solution 1 drop at bedtime.  . metFORMIN (GLUCOPHAGE-XR) 500 MG 24 hr tablet TAKE 2 TABLETS (1,000 MG TOTAL) BY MOUTH 2 (TWO) TIMES DAILY WITH A MEAL.  . Multiple Vitamins-Minerals (WOMENS 50+ MULTI VITAMIN/MIN) TABS Take 1 tablet by mouth daily.  . Semaglutide,0.25 or 0.5MG/DOS, (OZEMPIC, 0.25 OR 0.5 MG/DOSE,) 2 MG/1.5ML SOPN Inject 0.5 mg into the skin once a week.   No facility-administered encounter medications on file as of 06/10/2020.     REVIEW OF SYSTEMS  : All other systems reviewed and negative except where noted in the History of Present Illness.   PHYSICAL EXAM: BP (!) 152/84 (BP Location: Right Arm, Patient Position: Sitting, Cuff Size: Normal)   Pulse 84   Ht 5' 1.5" (1.562 m)   Wt 119 lb 2 oz (54 kg)   SpO2 98%   BMI 22.14 kg/m  General: Well developed AA female in no acute distress Head: Normocephalic and atraumatic Eyes:  Sclerae anicteric, conjunctiva pink. Ears: Normal auditory acuity Lungs: Clear throughout to auscultation; no increased WOB. Heart: Regular rate and rhythm; no M/R/G. Abdomen: Soft, non-distended.  BS present.  Minimal diffuse TTP. Musculoskeletal: Symmetrical with no gross deformities  Skin: No lesions on visible extremities Extremities: No edema  Neurological: Alert oriented x 4, grossly non-focal Psychological:  Alert and cooperative. Normal mood and affect  ASSESSMENT AND PLAN: *69 year old female with complaints of altered bowel habits, more so ongoing issues with diarrhea. Colonoscopy January 2020 was negative for microscopic colitis and other causes. This has been an ongoing complaint with her for years. Question medication related as she is on metformin 1000 mg twice daily. Ozempic also list diarrhea as a possible side effect. Possible component of IBS as well. We will check celiac labs as well as a TSH level. Will check  fecal pancreatic elastase level to rule out EPI. We will also check for SIBO with breath test. We discussed lactose intolerance and she will try lactose-free diet for a few weeks to see if this helps. Literature given.   CC:  [name], MD

## 2020-06-10 NOTE — Patient Instructions (Addendum)
If you are age 68 or older, your body mass index should be between 23-30. Your Body mass index is 22.14 kg/m. If this is out of the aforementioned range listed, please consider follow up with your Primary Care Provider.  If you are age 52 or younger, your body mass index should be between 19-25. Your Body mass index is 22.14 kg/m. If this is out of the aformentioned range listed, please consider follow up with your Primary Care Provider.   Your provider has requested that you go to the basement level for lab work before leaving today. Press "B" on the elevator. The lab is located at the first door on the left as you exit the elevator.  You have been given a testing kit to check for small intestine bacterial overgrowth (SIBO) which is completed by a company named [facility]. Make sure to return your test in the mail using the return mailing label given to you along with the kit. Your demographic and insurance information have already been sent to the company and they should be in contact with you over the next week regarding this test. [facility] will collect an upfront charge of $99.74 for commercial insurance plans and $209.74 is you are paying cash. Make sure to discuss with [facility] PRIOR to having the test if they have gotten informatoin from your insurance company as to how much your testing will cost out of pocket, if any. Please keep in mind that you will be getting a call from phone number [phone] or a similar number. If you do not hear from them within this time frame, please call our office at [phone].

## 2020-06-11 NOTE — Progress Notes (Signed)
I agree with the above note, plan 

## 2020-06-12 ENCOUNTER — Ambulatory Visit: Payer: Medicare Other | Admitting: *Deleted

## 2020-06-12 DIAGNOSIS — E113513 Type 2 diabetes mellitus with proliferative diabetic retinopathy with macular edema, bilateral: Secondary | ICD-10-CM

## 2020-06-12 DIAGNOSIS — I10 Essential (primary) hypertension: Secondary | ICD-10-CM

## 2020-06-12 LAB — TISSUE TRANSGLUTAMINASE ABS,IGG,IGA
(tTG) Ab, IgA: 1 U/mL
(tTG) Ab, IgG: 7 U/mL — ABNORMAL HIGH

## 2020-06-12 NOTE — Chronic Care Management (AMB) (Signed)
Chronic Care Management   Follow Up Note   06/12/2020 Name: Amanda [name] MRN: [id number] DOB: 03/29/1951  Referred by: Cato Mulligan, MD Reason for referral : Chronic Care Management (DM, HTN)   Amanda [name] is a 69 y.o. year old female who is a primary care patient of Cato Mulligan, MD. The CCM team was consulted for assistance with chronic disease management and care coordination needs.    Review of patient status, including review of consultants reports, relevant laboratory and other test results, and collaboration with appropriate care team members and the patient's provider was performed as part of comprehensive patient evaluation and provision of chronic care management services.    SDOH (Social Determinants of Health) assessments performed: No See Care Plan activities for detailed interventions related to [facility])     Outpatient Encounter Medications as of 06/12/2020  Medication Sig Note  . ACCU-CHEK AVIVA PLUS test strip USE AS DIRECTED UP TO FOUR TIMES DAILY   . Accu-Chek Softclix Lancets lancets USE AS DIRECTED UP TO FOUR TIMES DAILY   . acetaminophen (TYLENOL) 325 MG tablet Take 2 tablets (650 mg total) by mouth every 6 (six) hours as needed (or Fever >/= 101).   [name] amLODipine (NORVASC) 5 MG tablet TAKE 1 TABLET (5 MG TOTAL) BY MOUTH DAILY.   [name] atorvastatin (LIPITOR) 40 MG tablet TAKE 1 TABLET (40 MG TOTAL) BY MOUTH DAILY AT 6 PM.   . blood glucose meter kit and supplies KIT Dispense based on patient and insurance preference. Use up to four times daily as directed. .   . Capsaicin-Menthol-Methyl Sal (CAPSAICIN-METHYL SAL-MENTHOL) 0.025-1-12 % CREA Apply 1 application topically as needed. Apply over painful areas   . cetirizine (ZYRTEC) 5 MG tablet Take 1 tablet (5 mg total) by mouth daily.   . Cholecalciferol (VITAMIN D3) 2000 units capsule Take by mouth daily.    . diclofenac Sodium (VOLTAREN) 1 % GEL Apply topically. 04/08/2020: Uses prn  . Ferrous Sulfate (IRON) 325  (65 Fe) MG TABS Take 1 tablet (325 mg total) by mouth daily.   . fluticasone (FLONASE) 50 MCG/ACT nasal spray Place 2 sprays into both nostrils daily.   [name] latanoprost (XALATAN) 0.005 % ophthalmic solution 1 drop at bedtime. 09/10/2019: LF 6.4.20 #2.5/30  . metFORMIN (GLUCOPHAGE-XR) 500 MG 24 hr tablet TAKE 2 TABLETS (1,000 MG TOTAL) BY MOUTH 2 (TWO) TIMES DAILY WITH A MEAL.   . Multiple Vitamins-Minerals (WOMENS 50+ MULTI VITAMIN/MIN) TABS Take 1 tablet by mouth daily.   . Semaglutide,0.25 or 0.5MG/DOS, (OZEMPIC, 0.25 OR 0.5 MG/DOSE,) 2 MG/1.5ML SOPN Inject 0.5 mg into the skin once a week.    No facility-administered encounter medications on file as of 06/12/2020.     Objective:  BP Readings from Last 3 Encounters:  06/10/20 (!) 152/84  03/23/20 (!) 157/86  12/24/19 110/75   Wt Readings from Last 3 Encounters:  06/10/20 119 lb 2 oz (54 kg)  03/23/20 116 lb 1.6 oz (52.7 kg)  12/24/19 113 lb 8 oz (51.5 kg)   Lab Results  Component Value Date   HGBA1C 6.3 (A) 03/23/2020   HGBA1C 7.6 (A) 12/24/2019   HGBA1C 10.9 (H) 09/09/2019   Lab Results  Component Value Date   MICROALBUR 18.4 (H) 06/16/2015   LDLCALC 54 09/10/2019   CREATININE 1.08 06/10/2020   Lab Results  Component Value Date   LABMICR 577.1 09/30/2019   LABMICR 217.5 07/24/2018   MICROALBUR 18.4 (H) 06/16/2015   MICROALBUR 41.36 (H) 05/01/2014  Goals Addressed              This Visit's Progress     Patient Stated   .  " I like taking the Ozempic for my diabetes but need help with the copay." (pt-stated)        CARE PLAN ENTRY (see longitudinal plan of care for additional care plan information)  Current Barriers:  . Difficulty obtaining medications- patient states she has been off Ozempic for > 2 weeks because she cannot afford the copay, she said her blood sugars are running higher and she wants to stay on it, she says her gastroenterology provider did not suggest she stop it during her visit on 6/23  even though it can contribute to GI distress. She says she completes the [employer] patient assistance application and mails it to the pharmacy techs so she doesn't understand why they don't get it. She agrees to come to the clinic to complete the form to expedite the process.  Nurse Case Manager Clinical Goal(s):  [name] Over the next 7-14 days, patient will work with CCM team to address needs related to completing Nov Nordisk patient assistance application form for [employer].  Interventions:  . Inter-disciplinary care team collaboration (see longitudinal plan of care) . Collaborated with Cheryle Horsfall clinic pharmacy tech about meeting with patient to complete the form. . This CCM RN will assist patient with form completion in the clinic if pharmacy tech cannot  Patient Self Care Activities:  . Completes Ozempic patient assistance form with help of health care team. . Unable to independently complete Ozempic patient assistance form and return complete form to pharmacy tech.  Initial goal documentation         Plan:   The care management team will reach out to the patient again over the next 7-14 days.    [name] RN, CCM, [facility] RN Care Manager [phone]

## 2020-06-12 NOTE — Patient Instructions (Signed)
Visit Information Either the pharmacy tech Cheryle Horsfall or I will call you next week about coming to the clinic to complete the Ozempic patient assistance form.  Goals Addressed              This Visit's Progress     Patient Stated   .  " I like taking the Ozempic for my diabetes but need help with the copay." (pt-stated)        CARE PLAN ENTRY (see longitudinal plan of care for additional care plan information)  Current Barriers:  . Difficulty obtaining medications- patient states she has been off Ozempic for > 2 weeks because she cannot afford the copay, she said her blood sugars are running higher and she wants to stay on it, she says her gastroenterology provider did not suggest she stop it during her visit on 6/23 even though it can contribute to GI distress. She says she completes the [employer] patient assistance application and mails it to the pharmacy techs so she doesn't understand why they don't get it. She agrees to come to the clinic to complete the form to expedite the process.  Nurse Case Manager Clinical Goal(s):  [name] Over the next 7-14 days, patient will work with CCM team to address needs related to completing Nov Nordisk patient assistance application form for [employer].  Interventions:  . Inter-disciplinary care team collaboration (see longitudinal plan of care) . Collaborated with Cheryle Horsfall clinic pharmacy tech about meeting with patient to complete the form. . This CCM RN will assist patient with form completion in the clinic if pharmacy tech cannot  Patient Self Care Activities:  . Completes Ozempic patient assistance form with help of health care team. . Unable to independently complete Ozempic patient assistance form and return complete form to pharmacy tech.  Initial goal documentation        The patient verbalized understanding of instructions provided today and declined a print copy of patient instruction materials.   The care management team will  reach out to the patient again over the next 7-14 days.   [name] RN, CCM, [facility] RN Care Manager [phone]

## 2020-06-16 ENCOUNTER — Ambulatory Visit: Payer: Self-pay

## 2020-06-16 DIAGNOSIS — Z794 Long term (current) use of insulin: Secondary | ICD-10-CM

## 2020-06-16 DIAGNOSIS — I10 Essential (primary) hypertension: Secondary | ICD-10-CM

## 2020-06-16 DIAGNOSIS — E113513 Type 2 diabetes mellitus with proliferative diabetic retinopathy with macular edema, bilateral: Secondary | ICD-10-CM

## 2020-06-16 NOTE — Progress Notes (Signed)
Internal Medicine Clinic Resident  I have personally reviewed this encounter including the documentation in this note and/or discussed this patient with the care management provider. I will address any urgent items identified by the care management provider and will communicate my actions to the patient's PCP. I have reviewed the patient's CCM visit with my supervising attending, Dr Philipp Ovens.  Asencion Noble, MD 06/16/2020

## 2020-06-16 NOTE — Chronic Care Management (AMB) (Signed)
  Care Management   Follow Up Note   06/16/2020 Name: Amanda [name] MRN: [id number] DOB: Sep 11, 1951  Referred by: Amanda Mulligan, MD Reason for referral : Care Coordination (Medication Assistance)   Amanda [name] is a 69 y.o. year old female who is a primary care patient of Amanda Mulligan, MD. The care management team was consulted for assistance with care management and care coordination needs.    Review of patient status, including review of consultants reports, relevant laboratory and other test results, and collaboration with appropriate care team members and the patient's provider was performed as part of comprehensive patient evaluation and provision of chronic care management services.    SDOH (Social Determinants of Health) assessments performed: No See Care Plan activities for detailed interventions related to Amanda [name])     Advanced Directives: See Care Plan and [employer] application for related entries.   Goals Addressed              This Visit's Progress   .  " I like taking the Ozempic for my diabetes but need help with the copay." (pt-stated)        CARE PLAN ENTRY (see longitudinal plan of care for additional care plan information)  Current Barriers:  . Difficulty obtaining medications- patient states she has been off Ozempic for > 2 weeks because she cannot afford the copay, she said her blood sugars are running higher and she wants to stay on it, she says her gastroenterology provider did not suggest she stop it during her visit on 6/23 even though it can contribute to GI distress. She says she completes the [employer] patient assistance application and mails it to the pharmacy techs so she doesn't understand why they don't get it. She agrees to come to the clinic to complete the form to expedite the process.  Nurse Case Manager Clinical Goal(s):  [name] Over the next 7-14 days, patient will work with CCM team to address needs related to completing Nov Nordisk patient  assistance application form for [employer].  Interventions:  . Inter-disciplinary care team collaboration (see longitudinal plan of care) . Contacted patient and scheduled clinic appointment with Pharmacy Technician, Amanda [name], for completion of patient assistance application . Directed patient to bring proof of income so that copies can be made . Informed Amanda [name] of appointment date/time.   Patient Self Care Activities:  . Completes Ozempic patient assistance form with help of health care team. . Unable to independently complete Ozempic patient assistance form and return complete form to pharmacy tech.  Please see past updates related to this goal by clicking on the "Past Updates" button in the selected goal          The patient has been provided with contact information for the care management team and has been advised to call with any health related questions or concerns.      Ronn Melena, [place] Coordination Social Worker [place] [phone]

## 2020-06-16 NOTE — Patient Instructions (Signed)
Visit Information  Goals Addressed              This Visit's Progress   .  " I like taking the Ozempic for my diabetes but need help with the copay." (pt-stated)        CARE PLAN ENTRY (see longitudinal plan of care for additional care plan information)  Current Barriers:  . Difficulty obtaining medications- patient states she has been off Ozempic for > 2 weeks because she cannot afford the copay, she said her blood sugars are running higher and she wants to stay on it, she says her gastroenterology provider did not suggest she stop it during her visit on 6/23 even though it can contribute to GI distress. She says she completes the [employer] patient assistance application and mails it to the pharmacy techs so she doesn't understand why they don't get it. She agrees to come to the clinic to complete the form to expedite the process.  Nurse Case Manager Clinical Goal(s):  [name] Over the next 7-14 days, patient will work with CCM team to address needs related to completing Nov Nordisk patient assistance application form for [employer].  Interventions:  . Inter-disciplinary care team collaboration (see longitudinal plan of care) . Contacted patient and scheduled clinic appointment with Pharmacy Technician, Cheryle Horsfall, for completion of patient assistance application . Directed patient to bring proof of income so that copies can be made . Informed [name] of appointment date/time.   Patient Self Care Activities:  . Completes Ozempic patient assistance form with help of health care team. . Unable to independently complete Ozempic patient assistance form and return complete form to pharmacy tech.  Please see past updates related to this goal by clicking on the "Past Updates" button in the selected goal         Patient verbalizes understanding of instructions provided today.   The patient has been provided with contact information for the care management team and has been advised to call  with any health related questions or concerns.      Ronn Melena, [place] Coordination Social Worker [place] [phone]

## 2020-06-17 ENCOUNTER — Ambulatory Visit: Payer: Self-pay

## 2020-06-17 ENCOUNTER — Telehealth: Payer: Self-pay

## 2020-06-17 DIAGNOSIS — E113513 Type 2 diabetes mellitus with proliferative diabetic retinopathy with macular edema, bilateral: Secondary | ICD-10-CM

## 2020-06-17 DIAGNOSIS — Z794 Long term (current) use of insulin: Secondary | ICD-10-CM

## 2020-06-17 DIAGNOSIS — I10 Essential (primary) hypertension: Secondary | ICD-10-CM

## 2020-06-17 NOTE — Telephone Encounter (Signed)
Left a voicemail for pt at pt's request today at 10:02am.  Pt needs assistance completing a [employer] patient assistance application.  On the voicemail left for pt I stated I would be available to her any time on Thursdays between 9am-12pm at [facility] Internal Medicine through the end of July.

## 2020-06-17 NOTE — Progress Notes (Signed)
Internal Medicine Clinic Resident  I have personally reviewed this encounter including the documentation in this note and/or discussed this patient with the care management provider. I will address any urgent items identified by the care management provider and will communicate my actions to the patient's PCP. I have reviewed the patient's CCM visit with my supervising attending, Dr Rebeca Alert.  Asencion Noble, MD 06/17/2020

## 2020-06-17 NOTE — Chronic Care Management (AMB) (Signed)
  Care Management   Follow Up Note   06/17/2020 Name: RAMON BRANT MRN: [id number] DOB: 15-Nov-1951  Referred by: Cato Mulligan, MD Reason for referral : Care Coordination (Medication Assistance)   LADORA OSTERBERG is a 69 y.o. year old female who is a primary care patient of Cato Mulligan, MD. The care management team was consulted for assistance with care management and care coordination needs.    Review of patient status, including review of consultants reports, relevant laboratory and other test results, and collaboration with appropriate care team members and the patient's provider was performed as part of comprehensive patient evaluation and provision of chronic care management services.    SDOH (Social Determinants of Health) assessments performed: No See Care Plan activities for detailed interventions related to [facility])     Advanced Directives: See Care Plan and Vyn[employer] application for related entries.   Goals Addressed              This Visit's Progress   .  " I like taking the Ozempic for my diabetes but need help with the copay." (pt-stated)        CARE PLAN ENTRY (see longitudinal plan of care for additional care plan information)  Current Barriers:  . Difficulty obtaining medications- patient states she has been off Ozempic for > 2 weeks because she cannot afford the copay, she said her blood sugars are running higher and she wants to stay on it, she says her gastroenterology provider did not suggest she stop it during her visit on 6/23 even though it can contribute to GI distress. She says she completes the [employer] patient assistance application and mails it to the pharmacy techs so she doesn't understand why they don't get it. She agrees to come to the clinic to complete the form to expedite the process.  Nurse Case Manager Clinical Goal(s):  [name] Over the next 7-14 days, patient will work with CCM team to address needs related to completing Nov Nordisk patient  assistance application form for [employer].  Interventions:  . Inter-disciplinary care team collaboration (see longitudinal plan of care) . Received message from patient stating she is no longer able to come to clinic on 06/18/20 for patient assistance application . In-basket message sent to [name], Cheryle Horsfall, requesting that she contact patient to reschedule.    Patient Self Care Activities:  . Completes Ozempic patient assistance form with help of health care team. . Unable to independently complete Ozempic patient assistance form and return complete form to pharmacy tech.  Please see past updates related to this goal by clicking on the "Past Updates" button in the selected goal          Requested that [name], Cheryle Horsfall, contact patient to reschedule appointment.        Ronn Melena, [place] Coordination Social Worker [employer] [phone]

## 2020-06-17 NOTE — Progress Notes (Signed)
Internal Medicine Clinic Attending  CCM services provided by the care management provider and their documentation were discussed with Dr. Sherry Ruffing. We reviewed the pertinent findings, urgent action items addressed by the resident and non-urgent items to be addressed by the PCP.  I agree with the assessment, diagnosis, and plan of care documented in the CCM and resident's note.  Velna Ochs, MD 06/17/2020

## 2020-06-17 NOTE — Patient Instructions (Signed)
Visit Information  Goals Addressed              This Visit's Progress   .  " I like taking the Ozempic for my diabetes but need help with the copay." (pt-stated)        CARE PLAN ENTRY (see longitudinal plan of care for additional care plan information)  Current Barriers:  . Difficulty obtaining medications- patient states she has been off Ozempic for > 2 weeks because she cannot afford the copay, she said her blood sugars are running higher and she wants to stay on it, she says her gastroenterology provider did not suggest she stop it during her visit on 6/23 even though it can contribute to GI distress. She says she completes the [employer] patient assistance application and mails it to the pharmacy techs so she doesn't understand why they don't get it. She agrees to come to the clinic to complete the form to expedite the process.  Nurse Case Manager Clinical Goal(s):  [name] Over the next 7-14 days, patient will work with CCM team to address needs related to completing Nov Nordisk patient assistance application form for [employer].  Interventions:  . Inter-disciplinary care team collaboration (see longitudinal plan of care) . Received message from patient stating she is no longer able to come to clinic on 06/18/20 for patient assistance application . In-basket message sent to [name], Cheryle Horsfall, requesting that she contact patient to reschedule.    Patient Self Care Activities:  . Completes Ozempic patient assistance form with help of health care team. . Unable to independently complete Ozempic patient assistance form and return complete form to pharmacy tech.  Please see past updates related to this goal by clicking on the "Past Updates" button in the selected goal          Requested that [name], Cheryle Horsfall, contact patient to reschedule appointment.

## 2020-06-19 NOTE — Progress Notes (Signed)
Internal Medicine Clinic Attending  CCM services provided by the care management provider and their documentation were reviewed with Dr. Sherry Ruffing.  We reviewed the pertinent findings, urgent action items addressed by the resident and non-urgent items to be addressed by the PCP.  I agree with the assessment, diagnosis, and plan of care documented in the CCM and resident's note.  Oda Kilts, MD 06/19/2020

## 2020-06-29 ENCOUNTER — Telehealth: Payer: Self-pay | Admitting: Student

## 2020-06-29 NOTE — Chronic Care Management (AMB) (Signed)
  Chronic Care Management   Note  06/29/2020 Name: Amanda [name] MRN: [id number] DOB: [birth year]  Amanda [name] is a 69 y.o. year old female who is a primary care patient of Cato Mulligan, MD and is actively engaged with the care management team. I reached out to [name] by phone today to assist with re-scheduling a follow up visit with the BSW.  Follow up plan: Telephone appointment with care management team member scheduled for: 07/10/2020.  [place], [place] [address] Direct Dial: [phone] [email or web address]: [email or web address]

## 2020-06-30 ENCOUNTER — Encounter: Payer: Medicare Other | Admitting: Student

## 2020-07-01 ENCOUNTER — Other Ambulatory Visit: Payer: Self-pay

## 2020-07-01 ENCOUNTER — Encounter: Payer: Self-pay | Admitting: Podiatry

## 2020-07-01 ENCOUNTER — Ambulatory Visit (INDEPENDENT_AMBULATORY_CARE_PROVIDER_SITE_OTHER): Payer: Medicare Other | Admitting: Podiatry

## 2020-07-01 DIAGNOSIS — M79675 Pain in left toe(s): Secondary | ICD-10-CM

## 2020-07-01 DIAGNOSIS — M2041 Other hammer toe(s) (acquired), right foot: Secondary | ICD-10-CM

## 2020-07-01 DIAGNOSIS — B351 Tinea unguium: Secondary | ICD-10-CM | POA: Diagnosis not present

## 2020-07-01 DIAGNOSIS — M2011 Hallux valgus (acquired), right foot: Secondary | ICD-10-CM

## 2020-07-01 DIAGNOSIS — M79674 Pain in right toe(s): Secondary | ICD-10-CM

## 2020-07-01 DIAGNOSIS — L84 Corns and callosities: Secondary | ICD-10-CM

## 2020-07-01 DIAGNOSIS — E119 Type 2 diabetes mellitus without complications: Secondary | ICD-10-CM | POA: Diagnosis not present

## 2020-07-01 DIAGNOSIS — M2012 Hallux valgus (acquired), left foot: Secondary | ICD-10-CM

## 2020-07-03 ENCOUNTER — Ambulatory Visit (INDEPENDENT_AMBULATORY_CARE_PROVIDER_SITE_OTHER): Payer: Medicare Other | Admitting: Student

## 2020-07-03 ENCOUNTER — Encounter: Payer: Self-pay | Admitting: Student

## 2020-07-03 ENCOUNTER — Telehealth: Payer: Medicare Other

## 2020-07-03 VITALS — BP 177/87 | HR 86 | Temp 97.9°F | Ht 61.0 in | Wt 123.6 lb

## 2020-07-03 DIAGNOSIS — Z794 Long term (current) use of insulin: Secondary | ICD-10-CM | POA: Diagnosis not present

## 2020-07-03 DIAGNOSIS — E119 Type 2 diabetes mellitus without complications: Secondary | ICD-10-CM

## 2020-07-03 DIAGNOSIS — I10 Essential (primary) hypertension: Secondary | ICD-10-CM

## 2020-07-03 DIAGNOSIS — E118 Type 2 diabetes mellitus with unspecified complications: Secondary | ICD-10-CM

## 2020-07-03 DIAGNOSIS — K529 Noninfective gastroenteritis and colitis, unspecified: Secondary | ICD-10-CM

## 2020-07-03 LAB — POCT GLYCOSYLATED HEMOGLOBIN (HGB A1C): Hemoglobin A1C: 7.9 % — AB (ref 4.0–5.6)

## 2020-07-03 LAB — GLUCOSE, CAPILLARY: Glucose-Capillary: 160 mg/dL — ABNORMAL HIGH (ref 70–99)

## 2020-07-03 MED ORDER — CHLORTHALIDONE 25 MG PO TABS: 25.0000 mg | ORAL_TABLET | Freq: Every day | ORAL | 0 refills | Status: DC

## 2020-07-03 MED FILL — CHLORTHALIDONE 25 MG TABS: 25 | 30 days supply | Qty: 30 | Fill #0

## 2020-07-03 NOTE — Progress Notes (Signed)
   CC: Diabetes/hypertension follow-up  HPI:  [name] is a 69 y.o. F with PMH of diabetes, HLD, HTN and arthritis who presents to the clinic today for follow-up.  Patient states she has been out of her Ozempic for the past month due to financial barriers.  She is working with CCM to apply for financial assistance.  States she has been having headaches and dizziness as well as some lower extremity swelling and increased urination.  States her blood sugar has been high for the past few weeks. Patient denies any SOB, chest pain, palpitations, abdominal pain.   Of note, patient also complains of a persistent diarrhea that is being evaluated by GI.   Past Medical History:  Diagnosis Date  . Allergy    SEASONAL  . Anemia   . Arthritis   . Cataract    PRESENT IN LEFT/ CATARACTS REMOVED FROM RIGHT  . Diabetes mellitus 1996  . DKA (diabetic ketoacidoses) ([place]) 01/02/2013  . Hyperlipidemia   . Hypertension   . Hypotension   . Influenza A (H1N1) 01/03/2013  . Neuromuscular disorder ([place])    NEUROPATHY  . SYNCOPE 05/10/2010   Qualifier: Diagnosis of  By: Marilynne Halsted, RN, BSN, [place]     Review of Systems:  All ROS negative otherwise as stated in the HPI  Physical Exam:  General: Pleasant elderly woman. No acute distress. Well nourished, well developed. Head: Normocephalic, atraumatic w/o tenderness Eyes: PERRLA. Sclera is non-icteric  Neck: Supple without adenopathy. Thyroid gland midline without masees Cardiac: RRR. No murmurs, rubs or gallops. S1, S2. Trace bilateral LE edema Respiratory: Lungs CTAB. No wheezing or crackles. No increased WOB Abdominal: Soft, symmetric and non tender. No organomegaly. Normal bowel sounds Skin: Warm, dry and intact without rashes or lesions Extremities: Atraumatic. Full ROM. Pulse palpable. Neuro: A&O x 3. Moves all extremities Psych: Appropriate mood and affect  Vitals:   07/03/20 1427  BP: (!) 177/87  Pulse: 86  Temp: 97.9 F (36.6 C)   TempSrc: Oral  SpO2: 100%  Weight: 123 lb 9.6 oz (56.1 kg)  Height: 5\' 1"  (1.549 m)    Assessment & Plan:   See Encounters Tab for problem based charting.  Patient seen with Dr. Louann Liv, MD, MPH

## 2020-07-03 NOTE — Assessment & Plan Note (Addendum)
A1C elevated to 7.9 from 6.3 three months ago. Patient states she is not able to afford her ozempic so she has not had it in 1 month. Her blood sugar has been high at home. Glucose Meter printout shows an average glucose of 196 (94-354). Her elevated sugar is likely contributing to her headache and dizziness.   Plan --Patient working with CCM to apply Ozempic financial assistance --Provided patient with a sample of Ozempic until she gets financial assistance.  --Inject Ozempic 0.25 mg for 1 week then 0.5 mg after that -Follow up in 4 weeks for reassessment.

## 2020-07-03 NOTE — Patient Instructions (Signed)
Thank you, Amanda [name] for allowing [place] to provide your care today. Today we discussed your blood sugar and your blood pressure.  I have ordered the following labs for you:   Lab Orders     Glucose, capillary     POC Hbg A1C   I will call if any are abnormal. All of your labs can be accessed through "My Chart".   I have ordered the following medication/changed the following medications:  1. Start Ozempic. Inject 0.25 mg for 1 week, then 0.5 mg after that.  Please follow-up in 1 month  Should you have any questions or concerns please call the internal medicine clinic at [phone].    Linwood Dibbles, MD, MPH [facility]   My Chart Access: [email or web address]   If you have not already done so, please get your COVID 19 vaccine  To schedule an appointment for a COVID vaccine choice any of the following: Go to [email or web address]   Go to [email or web address]                  Call [phone]                                     Call [phone] and select Option 2

## 2020-07-03 NOTE — Assessment & Plan Note (Signed)
BP elevated to 177/87 in clinic. Repeat BP not significantly different. Patient states her BP runs high when she checks at home but not this high. Endorses headache and dizziness, but denies any blurry vision, CP, SOB, or palpitations. Trace bilateral edema to the ankle. In the setting of LE edema, will add a diuretic to her BP regimen and reassess in 1 month.  Plan: --Continue Amlodipine 5 mg daily --Start chlorthalidone 25 mg daily --Follow up in 4 weeks for reassessment and BMP.

## 2020-07-04 NOTE — Progress Notes (Signed)
Subjective: Amanda [name] presents today for follow up of preventative diabetic foot care and painful mycotic nails b/l that are difficult to trim. Pain interferes with ambulation. Aggravating factors include wearing enclosed shoe gear. Pain is relieved with periodic professional debridement.   She relates no new pedal complaints on today's visit.   Allergies  Allergen Reactions  . Gabapentin Other (See Comments)    Pt states medication made her feel "out of it".   Pt states medication made her feel "out of it".       Objective: There were no vitals filed for this visit.  Vascular Examination:  Capillary fill time to digits <3s b/l, palpable DP pulses b/l, palpable PT pulses b/l, pedal hair present b/l and skin temperature gradient within normal limits b/l  Dermatological Examination: Pedal skin with normal turgor, texture and tone bilaterally. Toenails 1-5 b/l elongated, discolored, dystrophic, thickened, crumbly with subungual debris and tenderness to dorsal palpation. Incurvated nailplate b/l border(s) L hallux and R hallux.  Nail border hypertrophy absent. There is tenderness to palpation. Sign(s) of infection: no clinical signs of infection noted on examination today.. Hyperkeratotic lesion(s) R 5th toe.  No erythema, no edema, no drainage, no flocculence.  Musculoskeletal: Normal muscle strength 5/5 to all lower extremity muscle groups bilaterally. No pain crepitus or joint limitation noted with ROM b/l. Hallux valgus with bunion deformity noted b/l lower extremities. Hammertoes noted to the R 5th toe.  Neurological: Pt has subjective symptoms of neuropathy. Protective sensation intact 5/5 intact bilaterally with 10g monofilament b/l. Vibratory sensation intact b/l.  Assessment: Pain due to onychomycosis of toenails of both feet  Corns  Hallux valgus, acquired, bilateral  Hammer toe of right foot  Controlled type 2 diabetes mellitus without complication, without long-term  current use of insulin ([place])  Plan: -Patient examined. -Continue diabetic foot care principles. -Toenails 1-5 b/l were debrided in length and girth without iatrogenic bleeding.Offending nail borders debrided and curretaged b/l great toes. Borders cleansed with alcohol. Antibiotic ointment applied. No further treatment required by patient. -Patient to continue soft, supportive shoe gear daily. -Corn(s) pared right 5th digit PIPJ utilizing sterile scalpel blade without incident. -Patient to continue soft, supportive shoe gear daily. She is interested in diabetic shoes. Start procedure for diabetic shoes. Patient qualifies based on diagnoses -Patient to report any pedal injuries to medical professional immediately. -Patient/POA to call should there be question/concern in the interim.  Return in about 9 weeks (around 09/02/2020) for diabetic nail trim.

## 2020-07-06 NOTE — Progress Notes (Signed)
Internal Medicine Clinic Attending  Case discussed with Dr. [place]  At the time of the visit.  We reviewed the resident's history and exam and pertinent patient test results.  I agree with the assessment, diagnosis, and plan of care documented in the resident's note.  

## 2020-07-07 ENCOUNTER — Telehealth: Payer: Medicare Other

## 2020-07-08 ENCOUNTER — Ambulatory Visit: Payer: Medicare Other | Admitting: *Deleted

## 2020-07-08 DIAGNOSIS — I10 Essential (primary) hypertension: Secondary | ICD-10-CM

## 2020-07-08 DIAGNOSIS — E785 Hyperlipidemia, unspecified: Secondary | ICD-10-CM

## 2020-07-08 DIAGNOSIS — E118 Type 2 diabetes mellitus with unspecified complications: Secondary | ICD-10-CM

## 2020-07-08 NOTE — Chronic Care Management (AMB) (Signed)
Chronic Care Management   Follow Up Note   07/08/2020 Name: Amanda [name] MRN: [id number] DOB: 1951/03/11  Referred by: Cato Mulligan, MD Reason for referral : Chronic Care Management (DM, HTN,CKD, HLD)   Amanda [name] is a 69 y.o. year old female who is a primary care patient of Cato Mulligan, MD. The CCM team was consulted for assistance with chronic disease management and care coordination needs.    Review of patient status, including review of consultants reports, relevant laboratory and other test results, and collaboration with appropriate care team members and the patient's provider was performed as part of comprehensive patient evaluation and provision of chronic care management services.    SDOH (Social Determinants of Health) assessments performed: No See Care Plan activities for detailed interventions related to [facility])     Outpatient Encounter Medications as of 07/08/2020  Medication Sig Note  . ACCU-CHEK AVIVA PLUS test strip USE AS DIRECTED UP TO FOUR TIMES DAILY   . Accu-Chek Softclix Lancets lancets USE AS DIRECTED UP TO FOUR TIMES DAILY   . acetaminophen (TYLENOL) 325 MG tablet Take 2 tablets (650 mg total) by mouth every 6 (six) hours as needed (or Fever >/= 101).   [name] amLODipine (NORVASC) 5 MG tablet TAKE 1 TABLET (5 MG TOTAL) BY MOUTH DAILY.   [name] atorvastatin (LIPITOR) 40 MG tablet TAKE 1 TABLET (40 MG TOTAL) BY MOUTH DAILY AT 6 PM.   . blood glucose meter kit and supplies KIT Dispense based on patient and insurance preference. Use up to four times daily as directed. .   . Capsaicin-Menthol-Methyl Sal (CAPSAICIN-METHYL SAL-MENTHOL) 0.025-1-12 % CREA Apply 1 application topically as needed. Apply over painful areas   . cetirizine (ZYRTEC) 5 MG tablet Take 1 tablet (5 mg total) by mouth daily.   . chlorthalidone (HYGROTON) 25 MG tablet Take 1 tablet (25 mg total) by mouth daily.   . Cholecalciferol (VITAMIN D3) 2000 units capsule Take by mouth daily.    .  diclofenac Sodium (VOLTAREN) 1 % GEL Apply topically. 04/08/2020: Uses prn  . Ferrous Sulfate (IRON) 325 (65 Fe) MG TABS Take 1 tablet (325 mg total) by mouth daily.   . fluticasone (FLONASE) 50 MCG/ACT nasal spray Place 2 sprays into both nostrils daily.   [name] latanoprost (XALATAN) 0.005 % ophthalmic solution 1 drop at bedtime. 09/10/2019: LF 6.4.20 #2.5/30  . metFORMIN (GLUCOPHAGE-XR) 500 MG 24 hr tablet TAKE 2 TABLETS (1,000 MG TOTAL) BY MOUTH 2 (TWO) TIMES DAILY WITH A MEAL.   . Multiple Vitamins-Minerals (WOMENS 50+ MULTI VITAMIN/MIN) TABS Take 1 tablet by mouth daily.   . Semaglutide,0.25 or 0.5MG/DOS, (OZEMPIC, 0.25 OR 0.5 MG/DOSE,) 2 MG/1.5ML SOPN Inject 0.5 mg into the skin once a week. Not taking as she cant afford copay- have made numerous offers to help her complete paperwork    No facility-administered encounter medications on file as of 07/08/2020.     Objective:  Lab Results  Component Value Date   HGBA1C 7.9 (A) 07/03/2020   HGBA1C 6.3 (A) 03/23/2020   HGBA1C 7.6 (A) 12/24/2019   Lab Results  Component Value Date   MICROALBUR 18.4 (H) 06/16/2015   LDLCALC 54 09/10/2019   CREATININE 1.08 06/10/2020   Lab Results  Component Value Date   CHOL 127 09/10/2019   HDL 54 09/10/2019   LDLCALC 54 09/10/2019   TRIG 115 09/13/2019   CHOLHDL 2.4 09/10/2019   BP Readings from Last 3 Encounters:  07/03/20 (!) 177/87  06/10/20 (!) 152/84  03/23/20 (!) 157/86    Goals Addressed              This Visit's Progress     Patient Stated   .  " I like taking the Ozempic for my diabetes but need help with the copay." (pt-stated)        CARE PLAN ENTRY (see longitudinal plan of care for additional care plan information)  Current Barriers:  . Difficulty obtaining medications- spoke with patient about assisting with completing Ozempic copay assistance paperwork. Patient states she appreciates this CCM RN's  persistence in contacting her about this issue and says she will call this  CCM RN  back to schedule an appointment as her blood sugar and Hgb A1C had increased when she was seen in the clinic on 7/16 because she is no longer on [place]):  [name] Over the next 30-60  days, patient will work with CCM team to address needs related to completing Nov Nordisk patient assistance application form for [employer].  Interventions:  . Inter-disciplinary care team collaboration (see longitudinal plan of care) . Again offered to meet with patient or arrange meeting with Cheryle Horsfall CPh T to assist with completing Ozempic patient assistance paperwork.Ensured she has this CCM RN's business cell number.    Patient Self Care Activities:  . Completes Ozempic patient assistance form with help of health care team. . Unable to independently complete Ozempic patient assistance form and return complete form to pharmacy tech.  Please see past updates related to this goal by clicking on the "Past Updates" button in the selected goal          Plan:   The care management team will reach out to the patient again over the next 30-60 days.    SIGNATURE

## 2020-07-08 NOTE — Progress Notes (Signed)
Internal Medicine Clinic Resident  I have personally reviewed this encounter including the documentation in this note and/or discussed this patient with the care management provider. I will address any urgent items identified by the care management provider and will communicate my actions to the patient's PCP. I have reviewed the patient's CCM visit with my supervising attending, Dr Hoffman.  Amanda Depasquale D Dragan Tamburrino, DO 07/08/2020    

## 2020-07-08 NOTE — Patient Instructions (Signed)
Visit Information  Please let me know when you can come to the clinic to complete the Ozempic copay paperwork.   Goals Addressed              This Visit's Progress     Patient Stated   .  " I like taking the Ozempic for my diabetes but need help with the copay." (pt-stated)        CARE PLAN ENTRY (see longitudinal plan of care for additional care plan information)  Current Barriers:  . Difficulty obtaining medications- spoke with patient about assisting with completing Ozempic copay assistance paperwork. Patient states she appreciates this CCM RN's persistence in contacting her about this issue and says she will call this CCM RN back to schedule an appointment as her blood sugar and Hgb A1C had increased when she was seen in the clinic on 7/16 because she is no longer on [place]):  [name] Over the next 30-60  days, patient will work with CCM team to address needs related to completing Nov Nordisk patient assistance application form for [employer].  Interventions:  . Inter-disciplinary care team collaboration (see longitudinal plan of care) . Again offered to meet with patient or arrange meeting with Cheryle Horsfall CPh T to assist with completing Ozempic patient assistance paperwork. Ensured she has this CCM RN's business cell number.    Patient Self Care Activities:  . Completes Ozempic patient assistance form with help of health care team. . Unable to independently complete Ozempic patient assistance form and return complete form to pharmacy tech.  Please see past updates related to this goal by clicking on the "Past Updates" button in the selected goal         The patient verbalized understanding of instructions provided today and declined a print copy of patient instruction materials.   The care management team will reach out to the patient again over the next 30-60 days.   [name] RN, CCM, [facility] RN Care Manager [phone]

## 2020-07-10 ENCOUNTER — Ambulatory Visit: Payer: Medicare Other

## 2020-07-10 DIAGNOSIS — I10 Essential (primary) hypertension: Secondary | ICD-10-CM

## 2020-07-10 DIAGNOSIS — E785 Hyperlipidemia, unspecified: Secondary | ICD-10-CM

## 2020-07-10 DIAGNOSIS — E118 Type 2 diabetes mellitus with unspecified complications: Secondary | ICD-10-CM

## 2020-07-10 NOTE — Progress Notes (Signed)
Internal Medicine Clinic Resident  I have personally reviewed this encounter including the documentation in this note and/or discussed this patient with the care management provider. I will address any urgent items identified by the care management provider and will communicate my actions to the patient's PCP. I have reviewed the patient's CCM visit with my supervising attending, Dr Heber [place].  Delice Bison, DO 07/10/2020

## 2020-07-10 NOTE — Patient Instructions (Signed)
Visit Information  Goals Addressed              This Visit's Progress   .  "We really need a ramp for my husband" (pt-stated)        Current Barriers:  [name] Knowledge Barriers related to resources available for ramp due to spouse's physical limitations.  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    Case Manager Clinical Goal(s):  [name] Over the next 180 days, patient will work with BSW to address needs related to spouse's physical limitations/need for ramp.   . Over the next 180 days, BSW will collaborate with RN Care Manager to address care management and care coordination needs  Interventions:  . Contacted patient regarding status of paperwork needed to proceed with assistance through Vocational Rehabilitation.  Due to several unforseen circumstances with family members, patient has not yet started on paperwork.   Patient Self Care Activities:  . Patient verbalizes understanding of plan to seek assistance through [facility] and Vocational Rehabilitation for ramp assistance.   . Self administers medications as prescribed . Performs ADL's independently . Performs IADL's independently  Please see past updates related to this goal by clicking on the "Past Updates" button in the selected goal         Patient verbalizes understanding of instructions provided today.   Telephone follow up appointment with care management team member scheduled for:07/24/20 @ 1:00PM    Sarp Vernier, [address] Worker [place] [phone]

## 2020-07-10 NOTE — Chronic Care Management (AMB) (Signed)
  Care Management   Follow Up Note   07/10/2020 Name: IVANA NICASTRO MRN: [id number] DOB: [birth year]  Referred by: Cato Mulligan, MD Reason for referral : Care Coordination (Ramp assistance)   DELANI KOHLI is a 69 y.o. year old female who is a primary care patient of Cato Mulligan, MD. The care management team was consulted for assistance with care management and care coordination needs.    Review of patient status, including review of consultants reports, relevant laboratory and other test results, and collaboration with appropriate care team members and the patient's provider was performed as part of comprehensive patient evaluation and provision of chronic care management services.    SDOH (Social Determinants of Health) assessments performed: No See Care Plan activities for detailed interventions related to [facility])     Advanced Directives: See Care Plan and Vynca application for related entries.   Goals Addressed              This Visit's Progress   .  "We really need a ramp for my husband" (pt-stated)        Current Barriers:  [name] Knowledge Barriers related to resources available for ramp due to spouse's physical limitations.  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    Case Manager Clinical Goal(s):  [name] Over the next 180 days, patient will work with BSW to address needs related to spouse's physical limitations/need for ramp.   . Over the next 180 days, BSW will collaborate with RN Care Manager to address care management and care coordination needs  Interventions:  . Contacted patient regarding status of paperwork needed to proceed with assistance through Vocational Rehabilitation.  Due to several unforseen circumstances with family members, patient has not yet started on paperwork.   Patient Self Care Activities:  . Patient verbalizes understanding of plan to seek assistance  through [facility] and Vocational Rehabilitation for ramp assistance.   . Self administers medications as prescribed . Performs ADL's independently . Performs IADL's independently  Please see past updates related to this goal by clicking on the "Past Updates" button in the selected goal          Telephone follow up appointment with care management team member scheduled for:07/24/20 @ 1:00 PM   [place], [place] [phone]

## 2020-07-13 ENCOUNTER — Other Ambulatory Visit: Payer: Self-pay | Admitting: Internal Medicine

## 2020-07-13 DIAGNOSIS — E118 Type 2 diabetes mellitus with unspecified complications: Secondary | ICD-10-CM

## 2020-07-13 DIAGNOSIS — Z794 Long term (current) use of insulin: Secondary | ICD-10-CM

## 2020-07-14 MED FILL — ACCU-CHEK AVIVA PLUS STRP: 50 days supply | Qty: 200 | Fill #0

## 2020-07-17 NOTE — Progress Notes (Signed)
Internal Medicine Clinic Attending  CCM services provided by the care management provider and their documentation were discussed with Dr. Bloomfield. We reviewed the pertinent findings, urgent action items addressed by the resident and non-urgent items to be addressed by the PCP.  I agree with the assessment, diagnosis, and plan of care documented in the CCM and resident's note.  Shanora Christensen C Samnang Shugars, DO 07/17/2020  

## 2020-07-17 NOTE — Progress Notes (Signed)
Internal Medicine Clinic Attending  CCM services provided by the care management provider and their documentation were discussed with Dr. Bloomfield. We reviewed the pertinent findings, urgent action items addressed by the resident and non-urgent items to be addressed by the PCP.  I agree with the assessment, diagnosis, and plan of care documented in the CCM and resident's note.  Patrik Turnbaugh C Ondrea Dow, DO 07/17/2020  

## 2020-07-24 ENCOUNTER — Ambulatory Visit: Payer: Medicare Other

## 2020-07-24 DIAGNOSIS — E118 Type 2 diabetes mellitus with unspecified complications: Secondary | ICD-10-CM

## 2020-07-24 DIAGNOSIS — I10 Essential (primary) hypertension: Secondary | ICD-10-CM

## 2020-07-24 NOTE — Chronic Care Management (AMB) (Signed)
  Care Management   Follow Up Note   07/24/2020 Name: Amanda [name] MRN: [id number] DOB: 09-08-1951  Referred by: Cato Mulligan, MD Reason for referral : Care Coordination (ramp assistance)   Amanda [name] is a 69 y.o. year old female who is a primary care patient of Cato Mulligan, MD. The care management team was consulted for assistance with care management and care coordination needs.    Review of patient status, including review of consultants reports, relevant laboratory and other test results, and collaboration with appropriate care team members and the patient's provider was performed as part of comprehensive patient evaluation and provision of chronic care management services.    SDOH (Social Determinants of Health) assessments performed: No See Care Plan activities for detailed interventions related to [facility])     Advanced Directives: See Care Plan and Vynca application for related entries.   Goals Addressed              This Visit's Progress   .  "We really need a ramp for my husband" (pt-stated)        Current Barriers:  [name] Knowledge Barriers related to resources available for ramp due to spouse's physical limitations.  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    Case Manager Clinical Goal(s):  [name] Over the next 180 days, patient will work with BSW to address needs related to spouse's physical limitations/need for ramp.   . Over the next 180 days, BSW will collaborate with RN Care Manager to address care management and care coordination needs  Interventions:  . Contacted patient regarding status of paperwork needed to proceed with assistance through [place]; Patient has not yet started on paperwork.  . Encouraged patient to call if assistance is needed when she is able to complete paperwork.  Patient Self Care Activities:  . Patient verbalizes understanding  of plan to seek assistance through [facility] and Vocational Rehabilitation for ramp assistance.   . Self administers medications as prescribed . Performs ADL's independently . Performs IADL's independently  Please see past updates related to this goal by clicking on the "Past Updates" button in the selected goal          The patient has been provided with contact information for the care management team and has been advised to call with any health related questions or concerns.      Ronn Melena, [place] Coordination Social Worker [place] [phone]

## 2020-07-24 NOTE — Progress Notes (Signed)
Internal Medicine Clinic Resident ° °I have personally reviewed this encounter including the documentation in this note and/or discussed this patient with the care management provider. I will address any urgent items identified by the care management provider and will communicate my actions to the patient's PCP. I have reviewed the patient's CCM visit with my supervising attending, Dr Mullen. ° °Jacy Brocker, MD °07/24/2020 ° ° ° °

## 2020-07-24 NOTE — Patient Instructions (Signed)
Visit Information  Goals Addressed              This Visit's Progress   .  "We really need a ramp for my husband" (pt-stated)        Current Barriers:  [name] Knowledge Barriers related to resources available for ramp due to spouse's physical limitations.  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    Case Manager Clinical Goal(s):  [name] Over the next 180 days, patient will work with BSW to address needs related to spouse's physical limitations/need for ramp.   . Over the next 180 days, BSW will collaborate with RN Care Manager to address care management and care coordination needs  Interventions:  . Contacted patient regarding status of paperwork needed to proceed with assistance through [place]; Patient has not yet started on paperwork.  . Encouraged patient to call if assistance is needed when she is able to complete paperwork.  Patient Self Care Activities:  . Patient verbalizes understanding of plan to seek assistance through [facility] and Vocational Rehabilitation for ramp assistance.   . Self administers medications as prescribed . Performs ADL's independently . Performs IADL's independently  Please see past updates related to this goal by clicking on the "Past Updates" button in the selected goal         Patient verbalizes understanding of instructions provided today.   The patient has been provided with contact information for the care management team and has been advised to call with any health related questions or concerns.      Ronn Melena, [place] Coordination Social Worker [place] [phone]

## 2020-07-27 ENCOUNTER — Ambulatory Visit: Payer: Medicare Other | Admitting: *Deleted

## 2020-07-27 DIAGNOSIS — E118 Type 2 diabetes mellitus with unspecified complications: Secondary | ICD-10-CM

## 2020-07-27 DIAGNOSIS — I10 Essential (primary) hypertension: Secondary | ICD-10-CM

## 2020-07-27 NOTE — Patient Instructions (Signed)
Visit Information It was nice speaking with you today. I will mail you educational information to help you manage you blood sugar and blood pressure in addition to taking your medications as prescribed. I have mailed the [employer] patient assistance form for Ozempic, please complete th highlighted sections and call Claiborne Billings godwin at the clinic on Thursdays if you need help.   Goals Addressed              This Visit's Progress     Patient Stated   .  " I like taking the Ozempic for my diabetes but need help with the copay." (pt-stated)        CARE PLAN ENTRY (see longitudinal plan of care for additional care plan information)  Current Barriers:  . Difficulty obtaining medications- spoke with patient about assisting with completing Ozempic copay assistance paperwork. Patient states she very much wants to start back on the Ozempic and is willing to complete the paperwork with assistance from Cheryle Horsfall clinic pharmacy technician appreciates appointment as her blood sugar and Hgb A1C had increased when she was seen in the clinic on 7/16 because she is no longer on [place]):  [name] Over the next 30-60  days, patient will work with CCM team to address needs related to completing Nov Nordisk patient assistance application form for [employer].  Interventions:  . Inter-disciplinary care team collaboration (see longitudinal plan of care) . Highlighted the patient section of Novo Nordisk patient assistance program form and mailed to patient with instructions to contact Cheryle Horsfall , pharmacy technician, at the clinic on Thursdays for assistance with completing the form . Also reviewed other classes of diabetes medications that are less cost prohibitive that might be an option if she is not approved for copy assistance with Ozempic   Patient Self Care Activities:  . Completes Ozempic patient assistance form with help of health care team. . Unable to independently  complete Ozempic patient assistance form and return complete form to pharmacy tech.  Please see past updates related to this goal by clicking on the "Past Updates" button in the selected goal      .  " My blood sugar is running higher since I have not had any Ozempic for the last 2 weeks; my blood pressure is OK" (pt-stated)        CARE PLAN ENTRY (see longitudinal plan of care for additional care plan information)  Current Barriers:  . Chronic Disease Management support, education, and care coordination needs related to HTN, HLD, DMII, and CKD - patient states blood sugars are higher as she is only taking Metformin because she cannot afford th Ozempic copay. She also says her blood pressure is running higher  . Clinical Goal(s) related to HTN, HLD, DMII, and CKD Stage. Over the next 30- 60 days, patient will:  . Work with the care management team to address educational, disease management, and care coordination needs  . Begin or continue self health monitoring activities as directed today Measure and record CBG (blood glucose) 1-2 times daily and Measure and record blood pressure 5-7 times per week . Call provider office for new or worsened signs and symptoms Blood glucose findings outside established parameters, Blood pressure findings outside established parameters, and New or worsened symptom related to HTN, HLD, DMII, and chronic diarrhea . Call care management team with questions or concerns . Verbalize basic understanding of patient centered plan of care established today  Interventions related to HTN,  HLD, DMII, and CKD. [name] Appropriate assessments completed  . Inter-disciplinary care team collaboration (see longitudinal plan of care) . Evaluation of current treatment plan related to HTN and DM and patient's adherence to plan as established by provider. . Reviewed medications with patient and discussed medication taking behavior . Provided chronic disease management education- reviewed  strategies to control blood pressure and blood sugar via lifestyle and mailed patient [place] brochure and [place] Management Exercise booklet and American Diabetes Association Ask the Experts Q&A Series information, "Living Well with Diabetes" and "Where Do I Begin" diabetes booklets  and Controlling Blood Pressure booklet . Discussed plans with patient for ongoing care management follow up and provided patient with direct contact information for care management team  Patient Self Care Activities related to HTN, HLD, DMII, and CKD. [name] Patient is unable to independently self-manage chronic health conditions  Please see past updates related to this goal by clicking on the "Past Updates" button in the selected goal        Other   .  Blood Pressure < 140/90        BP Readings from Last 3 Encounters:  07/03/20 (!) 177/87  06/10/20 (!) 152/84  03/23/20 (!) 157/86   Not meeting blood pressure targets     .  HEMOGLOBIN A1C < 7        Lab Results  Component Value Date   HGBA1C 7.9 (A) 07/03/2020   Not  meeting Hgb A1C target.    [name]  LDL CALC < 100        Lab Results  Component Value Date   CHOL 127 09/10/2019   HDL 54 09/10/2019   LDLCALC 54 09/10/2019   TRIG 115 09/13/2019   CHOLHDL 2.4 09/10/2019   Meeting lipid targets        The patient verbalized understanding of instructions provided today and declined a print copy of patient instruction materials.   The care management team will reach out to the patient again over the next 30-60 days.   [name] RN, CCM, [facility] RN Care Manager [phone]

## 2020-07-27 NOTE — Progress Notes (Signed)
Internal Medicine Clinic Resident  I have personally reviewed this encounter including the documentation in this note and/or discussed this patient with the care management provider. I will address any urgent items identified by the care management provider and will communicate my actions to the patient's PCP. I have reviewed the patient's CCM visit with my supervising attending, Dr Heber [place].  Jose Persia, MD 07/27/2020

## 2020-07-27 NOTE — Chronic Care Management (AMB) (Signed)
Chronic Care Management   Follow Up Note   07/27/2020 Name: Amanda [name] MRN: [id number] DOB: 11/29/1951  Referred by: Roylene Reason, MD Reason for referral : HTN, NIDDM   Amanda [name] is a 69 y.o. year old female who is a primary care patient of Roylene Reason, MD. The CCM team was consulted for assistance with chronic disease management and care coordination needs.    Review of patient status, including review of consultants reports, relevant laboratory and other test results, and collaboration with appropriate care team members and the patient's provider was performed as part of comprehensive patient evaluation and provision of chronic care management services.    SDOH (Social Determinants of Health) assessments performed: No See Care Plan activities for detailed interventions related to [facility])     Outpatient Encounter Medications as of 07/27/2020  Medication Sig Note  . ACCU-CHEK AVIVA PLUS test strip USE AS DIRECTED UP TO FOUR TIMES DAILY   . Accu-Chek Softclix Lancets lancets USE AS DIRECTED UP TO FOUR TIMES DAILY   . acetaminophen (TYLENOL) 325 MG tablet Take 2 tablets (650 mg total) by mouth every 6 (six) hours as needed (or Fever >/= 101).   [name] amLODipine (NORVASC) 5 MG tablet TAKE 1 TABLET (5 MG TOTAL) BY MOUTH DAILY.   [name] atorvastatin (LIPITOR) 40 MG tablet TAKE 1 TABLET (40 MG TOTAL) BY MOUTH DAILY AT 6 PM.   . blood glucose meter kit and supplies KIT Dispense based on patient and insurance preference. Use up to four times daily as directed. .   . Capsaicin-Menthol-Methyl Sal (CAPSAICIN-METHYL SAL-MENTHOL) 0.025-1-12 % CREA Apply 1 application topically as needed. Apply over painful areas   . cetirizine (ZYRTEC) 5 MG tablet Take 1 tablet (5 mg total) by mouth daily.   . chlorthalidone (HYGROTON) 25 MG tablet Take 1 tablet (25 mg total) by mouth daily.   . Cholecalciferol (VITAMIN D3) 2000 units capsule Take by mouth daily.    . diclofenac Sodium (VOLTAREN) 1 % GEL Apply  topically. 04/08/2020: Uses prn  . Ferrous Sulfate (IRON) 325 (65 Fe) MG TABS Take 1 tablet (325 mg total) by mouth daily.   . fluticasone (FLONASE) 50 MCG/ACT nasal spray Place 2 sprays into both nostrils daily.   [name] latanoprost (XALATAN) 0.005 % ophthalmic solution 1 drop at bedtime. 09/10/2019: LF 6.4.20 #2.5/30  . metFORMIN (GLUCOPHAGE-XR) 500 MG 24 hr tablet TAKE 2 TABLETS (1,000 MG TOTAL) BY MOUTH 2 (TWO) TIMES DAILY WITH A MEAL.   . Multiple Vitamins-Minerals (WOMENS 50+ MULTI VITAMIN/MIN) TABS Take 1 tablet by mouth daily.   . Semaglutide,0.25 or 0.5MG /DOS, (OZEMPIC, 0.25 OR 0.5 MG/DOSE,) 2 MG/1.5ML SOPN Inject 0.5 mg into the skin once a week.    No facility-administered encounter medications on file as of 07/27/2020.     Objective:  Wt Readings from Last 3 Encounters:  07/03/20 123 lb 9.6 oz (56.1 kg)  06/10/20 119 lb 2 oz (54 kg)  03/23/20 116 lb 1.6 oz (52.7 kg)    Goals Addressed              This Visit's Progress     Patient Stated   .  " I like taking the Ozempic for my diabetes but need help with the copay." (pt-stated)        CARE PLAN ENTRY (see longitudinal plan of care for additional care plan information)  Current Barriers:  . Difficulty obtaining medications- spoke with patient about assisting with completing Ozempic copay assistance paperwork. Patient states  she very much wants to start back on the Ozempic and is willing to complete the paperwork with assistance from Mardee Postin clinic pharmacy technician appreciates appointment as her blood sugar and Hgb A1C had increased when she was seen in the clinic on 7/16 because she is no longer on Ozempic  Nurse Case Manager Clinical Goal(s):  [name] Over the next 30-60  days, patient will work with CCM team to address needs related to completing Nov Nordisk patient assistance application form for [employer].  Interventions:  . Inter-disciplinary care team collaboration (see longitudinal plan of care) . Highlighted the  patient section of Novo Nordisk patient assistance program form and mailed to patient with instructions to contact Mardee Postin , pharmacy technician, at the clinic on Thursdays for assistance with completing the form . Also reviewed other classes of diabetes medications that are less cost prohibitive that might be an option if she is not approved for copy assistance with Ozempic   Patient Self Care Activities:  . Completes Ozempic patient assistance form with help of health care team. . Unable to independently complete Ozempic patient assistance form and return complete form to pharmacy tech.  Please see past updates related to this goal by clicking on the "Past Updates" button in the selected goal      .  " My blood sugar is running higher since I have not had any Ozempic for the last 2 weeks; my blood pressure is OK" (pt-stated)        CARE PLAN ENTRY (see longitudinal plan of care for additional care plan information)  Current Barriers:  . Chronic Disease Management support, education, and care coordination needs related to HTN, HLD, DMII, and CKD - patient states blood sugars are higher as she is only taking Metformin because she cannot afford th Ozempic copay. She also says her blood pressure is running higher  . Clinical Goal(s) related to HTN, HLD, DMII, and CKD Stage. Over the next 30- 60 days, patient will:  . Work with the care management team to address educational, disease management, and care coordination needs  . Begin or continue self health monitoring activities as directed today Measure and record CBG (blood glucose) 1-2 times daily and Measure and record blood pressure 5-7 times per week . Call provider office for new or worsened signs and symptoms Blood glucose findings outside established parameters, Blood pressure findings outside established parameters, and New or worsened symptom related to HTN, HLD, DMII, and chronic diarrhea . Call care management team with questions  or concerns . Verbalize basic understanding of patient centered plan of care established today  Interventions related to HTN, HLD, DMII, and CKD. [name] Appropriate assessments completed  . Inter-disciplinary care team collaboration (see longitudinal plan of care) . Evaluation of current treatment plan related to HTN and DM and patient's adherence to plan as established by provider. . Reviewed medications with patient and discussed medication taking behavior . Provided chronic disease management education- reviewed strategies to control blood pressure and blood sugar via lifestyle and mailed patient Planning Health Meals brochure and [facility] Care Management Exercise booklet and American Diabetes Association Ask the Experts Q&A Series information, "Living Well with Diabetes" and "Where Do I Begin" diabetes booklets  and Controlling Blood Pressure booklet . Discussed plans with patient for ongoing care management follow up and provided patient with direct contact information for care management team  Patient Self Care Activities related to HTN, HLD, DMII, and CKD. [name] Patient is unable to independently  self-manage chronic health conditions  Please see past updates related to this goal by clicking on the "Past Updates" button in the selected goal        Other   .  Blood Pressure < 140/90        BP Readings from Last 3 Encounters:  07/03/20 (!) 177/87  06/10/20 (!) 152/84  03/23/20 (!) 157/86   Not meeting blood pressure targets     .  HEMOGLOBIN A1C < 7        Lab Results  Component Value Date   HGBA1C 7.9 (A) 07/03/2020   Not  meeting Hgb A1C target.    [name]  LDL CALC < 100        Lab Results  Component Value Date   CHOL 127 09/10/2019   HDL 54 09/10/2019   LDLCALC 54 09/10/2019   TRIG 115 09/13/2019   CHOLHDL 2.4 09/10/2019   Meeting lipid targets         Plan:   The care management team will reach out to the patient again over the next 30-60 days.    Cranford Mon RN, CCM, [facility] CCM Clinic RN Care Manager [phone]

## 2020-07-27 NOTE — Progress Notes (Signed)
Internal Medicine Clinic Attending  CCM services provided by the care management provider and their documentation were discussed with Dr. Jinwala. We reviewed the pertinent findings, urgent action items addressed by the resident and non-urgent items to be addressed by the PCP.  I agree with the assessment, diagnosis, and plan of care documented in the CCM and resident's note.  Kym Fenter, MD 07/27/2020  

## 2020-08-17 MED FILL — AMOXICILLIN 500 MG CAPSULE: 500 | 6 days supply | Qty: 18 | Fill #0

## 2020-08-21 ENCOUNTER — Telehealth: Payer: Medicare Other

## 2020-08-21 MED FILL — ATORVASTATIN 40 MG TABLET: 40 | 90 days supply | Qty: 90 | Fill #1

## 2020-08-25 ENCOUNTER — Ambulatory Visit: Payer: Medicare Other

## 2020-08-25 DIAGNOSIS — I10 Essential (primary) hypertension: Secondary | ICD-10-CM

## 2020-08-25 DIAGNOSIS — E118 Type 2 diabetes mellitus with unspecified complications: Secondary | ICD-10-CM

## 2020-08-26 ENCOUNTER — Ambulatory Visit: Payer: Medicare Other

## 2020-08-26 DIAGNOSIS — E118 Type 2 diabetes mellitus with unspecified complications: Secondary | ICD-10-CM

## 2020-08-26 DIAGNOSIS — I10 Essential (primary) hypertension: Secondary | ICD-10-CM

## 2020-08-26 DIAGNOSIS — E785 Hyperlipidemia, unspecified: Secondary | ICD-10-CM

## 2020-08-26 NOTE — Chronic Care Management (AMB) (Signed)
  Care Management   Follow Up Note   08/26/2020 Name: Amanda [name] MRN: [id number] DOB: 1951-06-06  Referred by: Cato Mulligan, MD Reason for referral : Care Coordination (ramp resources)   Amanda [name] is a 69 y.o. year old female who is a primary care patient of Cato Mulligan, MD. The care management team was consulted for assistance with care management and care coordination needs.    Review of patient status, including review of consultants reports, relevant laboratory and other test results, and collaboration with appropriate care team members and the patient's provider was performed as part of comprehensive patient evaluation and provision of chronic care management services.    SDOH (Social Determinants of Health) assessments performed: No See Care Plan activities for detailed interventions related to [facility])     Advanced Directives: See Care Plan and Vynca application for related entries.   Goals Addressed              This Visit's Progress   .  "We really need a ramp for my husband" (pt-stated)        Current Barriers:  [name] Knowledge Barriers related to resources available for ramp due to spouse's physical limitations.  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    Case Manager Clinical Goal(s):  [name] Over the next 180 days, patient will work with BSW to address needs related to spouse's physical limitations/need for ramp.   . Over the next 180 days, BSW will collaborate with RN Care Manager to address care management and care coordination needs  Interventions:  . Received the following response from Donnamae Jude with [facility] regarding status of referral:  "I found his folder and it only has his referral data sheet and the intake form; the [place] packet of paperwork is not in the folder, and it looks like the case was closed because of no contact with client.  I will reach  out to them today, and if they are still interested I will put Mr. Klett back into our database and resend IL packet."  Patient Self Care Activities:  . Patient verbalizes understanding of plan to seek assistance through [facility] and Vocational Rehabilitation for ramp assistance.   . Self administers medications as prescribed . Performs ADL's independently . Performs IADL's independently  Please see past updates related to this goal by clicking on the "Past Updates" button in the selected goal          The patient has been provided with contact information for the care management team and has been advised to call with any health/community resource related questions or concerns.       Ronn Melena, [place] Coordination Social Worker [place] [phone]

## 2020-08-26 NOTE — Patient Instructions (Signed)
Visit Information  Goals Addressed              This Visit's Progress   .  COMPLETED: "For the most part we are good financially but so struggle from time to time" (pt-stated)        Current Barriers:  [name] Knowledge Barriers related to resources and support available to address financial constraints.     Case Manager Clinical Goal(s):  [name] Over the next 90 days, patient will work with BSW to address needs related to financial constraints. . Over the next 90 days, BSW will collaborate with RN Care Manager to address care management and care coordination needs  Interventions:  . Closing goal of care plan as patient has been provided with emergency assistance resources and has applied for Medicaid and [employer] SNAP.   Patient Self Care Activities:  . Self administers medications as prescribed . Performs ADL's independently . Performs IADL's independently  Please see past updates related to this goal by clicking on the "Past Updates" button in the selected goal      .  "We really need a ramp for my husband" (pt-stated)        Current Barriers:  [name] Knowledge Barriers related to resources available for ramp due to spouse's physical limitations.  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    Case Manager Clinical Goal(s):  [name] Over the next 180 days, patient will work with BSW to address needs related to spouse's physical limitations/need for ramp.   . Over the next 180 days, BSW will collaborate with RN Care Manager to address care management and care coordination needs  Interventions:  [name] Messaged Donnamae Jude with [facility] to determine if completed paperwork has been received from patient; Ms. Lovena Le states that patient's spouse is not in database.   [name] Provided Ms. Lovena Le with original referral information and communication between CCM BSW and former staff which indicated that intake has been  completed and completed paperwork was mailed to patient.    Patient Self Care Activities:  . Patient verbalizes understanding of plan to seek assistance through [facility] and Vocational Rehabilitation for ramp assistance.   . Self administers medications as prescribed . Performs ADL's independently . Performs IADL's independently  Please see past updates related to this goal by clicking on the "Past Updates" button in the selected goal           The patient has been provided with contact information for the care management team and has been advised to call with any health related questions or concerns.  Awaiting response from [facility] regarding status of referral.     Ronn Melena, [place] Worker [place] [phone]

## 2020-08-26 NOTE — Chronic Care Management (AMB) (Signed)
Care Management   Follow Up Note   08/26/2020 Name: Amanda [name] MRN: [id number] DOB: [birth year]  Referred by: Cato Mulligan, MD Reason for referral : Care Coordination (ramp assistance)   Amanda [name] is a 69 y.o. year old female who is a primary care patient of Cato Mulligan, MD. The care management team was consulted for assistance with care management and care coordination needs.    Review of patient status, including review of consultants reports, relevant laboratory and other test results, and collaboration with appropriate care team members and the patient's provider was performed as part of comprehensive patient evaluation and provision of chronic care management services.    SDOH (Social Determinants of Health) assessments performed: No See Care Plan activities for detailed interventions related to [facility])     Advanced Directives: See Care Plan and Vynca application for related entries.   Goals Addressed              This Visit's Progress   .  COMPLETED: "For the most part we are good financially but so struggle from time to time" (pt-stated)        Current Barriers:  [name] Knowledge Barriers related to resources and support available to address financial constraints.     Case Manager Clinical Goal(s):  [name] Over the next 90 days, patient will work with BSW to address needs related to financial constraints. . Over the next 90 days, BSW will collaborate with RN Care Manager to address care management and care coordination needs  Interventions:  . Closing goal of care plan as patient has been provided with emergency assistance resources and has applied for Medicaid and [place] SNAP.   Patient Self Care Activities:  . Self administers medications as prescribed . Performs ADL's independently . Performs IADL's independently  Please see past updates related to this goal by clicking on the "Past Updates" button in the selected goal      .  "We really need a ramp for my  husband" (pt-stated)        Current Barriers:  [name] Knowledge Barriers related to resources available for ramp due to spouse's physical limitations.  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    Case Manager Clinical Goal(s):  [name] Over the next 180 days, patient will work with BSW to address needs related to spouse's physical limitations/need for ramp.   . Over the next 180 days, BSW will collaborate with RN Care Manager to address care management and care coordination needs  Interventions:  [name] Messaged Donnamae Jude with [facility] to determine if completed paperwork has been received from patient; Ms. Lovena Le states that patient's spouse is not in database.   [name] Provided Ms. Lovena Le with original referral information and communication between CCM BSW and former staff which indicated that intake has been completed and completed paperwork was mailed to patient.    Patient Self Care Activities:  . Patient verbalizes understanding of plan to seek assistance through [facility] and Vocational Rehabilitation for ramp assistance.   . Self administers medications as prescribed . Performs ADL's independently . Performs IADL's independently  Please see past updates related to this goal by clicking on the "Past Updates" button in the selected goal          The patient has been provided with contact information for the care management team and has been advised to call with any health related  questions or concerns.  Awaiting response from [facility] regarding status of referral.    .   Ronn Melena, [place] Worker [place] [phone]

## 2020-08-26 NOTE — Patient Instructions (Signed)
Visit Information  Goals Addressed              This Visit's Progress   .  "We really need a ramp for my husband" (pt-stated)        Current Barriers:  [name] Knowledge Barriers related to resources available for ramp due to spouse's physical limitations.  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    Case Manager Clinical Goal(s):  [name] Over the next 180 days, patient will work with BSW to address needs related to spouse's physical limitations/need for ramp.   . Over the next 180 days, BSW will collaborate with RN Care Manager to address care management and care coordination needs  Interventions:  . Received the following response from Donnamae Jude with [facility] regarding status of referral:  "I found his folder and it only has his referral data sheet and the intake form; the [place] packet of paperwork is not in the folder, and it looks like the case was closed because of no contact with client.  I will reach out to them today, and if they are still interested I will put Mr. Vetsch back into our database and resend IL packet."  Patient Self Care Activities:  . Patient verbalizes understanding of plan to seek assistance through [facility] and Vocational Rehabilitation for ramp assistance.   . Self administers medications as prescribed . Performs ADL's independently . Performs IADL's independently  Please see past updates related to this goal by clicking on the "Past Updates" button in the selected goal           The patient has been provided with contact information for the care management team and has been advised to call with any health/community resource related questions or concerns.       Ronn Melena, [place] Coordination Social Worker [place] [phone]

## 2020-08-31 ENCOUNTER — Other Ambulatory Visit: Payer: Self-pay | Admitting: Student

## 2020-08-31 DIAGNOSIS — I10 Essential (primary) hypertension: Secondary | ICD-10-CM

## 2020-08-31 MED FILL — METFORMIN HCL ER 500 MG TB2: 500 | 90 days supply | Qty: 360 | Fill #0

## 2020-08-31 MED FILL — CHLORTHALIDONE 25 MG TABS: 25 | 30 days supply | Qty: 30 | Fill #0

## 2020-09-01 ENCOUNTER — Other Ambulatory Visit: Payer: Self-pay | Admitting: Student

## 2020-09-01 MED FILL — AMLODIPINE BESYLATE 5 MG TA: 5 | 90 days supply | Qty: 90 | Fill #0

## 2020-09-09 ENCOUNTER — Ambulatory Visit: Payer: Medicare Other | Admitting: Podiatry

## 2020-09-09 ENCOUNTER — Encounter: Payer: Self-pay | Admitting: Podiatry

## 2020-09-09 ENCOUNTER — Other Ambulatory Visit: Payer: Self-pay

## 2020-09-09 DIAGNOSIS — M79674 Pain in right toe(s): Secondary | ICD-10-CM | POA: Diagnosis not present

## 2020-09-09 DIAGNOSIS — B351 Tinea unguium: Secondary | ICD-10-CM

## 2020-09-09 DIAGNOSIS — M2011 Hallux valgus (acquired), right foot: Secondary | ICD-10-CM

## 2020-09-09 DIAGNOSIS — L84 Corns and callosities: Secondary | ICD-10-CM

## 2020-09-09 DIAGNOSIS — M2041 Other hammer toe(s) (acquired), right foot: Secondary | ICD-10-CM

## 2020-09-09 DIAGNOSIS — E1142 Type 2 diabetes mellitus with diabetic polyneuropathy: Secondary | ICD-10-CM

## 2020-09-09 DIAGNOSIS — M79675 Pain in left toe(s): Secondary | ICD-10-CM | POA: Diagnosis not present

## 2020-09-09 DIAGNOSIS — M2012 Hallux valgus (acquired), left foot: Secondary | ICD-10-CM

## 2020-09-13 NOTE — Progress Notes (Signed)
Subjective: Amanda [name] presents today for follow up of preventative diabetic foot care and painful mycotic nails b/l that are difficult to trim. Pain interferes with ambulation. Aggravating factors include wearing enclosed shoe gear. Pain is relieved with periodic professional debridement.   She relates no new pedal complaints on today's visit.   Allergies  Allergen Reactions  . Gabapentin Other (See Comments)    Pt states medication made her feel "out of it".   Pt states medication made her feel "out of it".       Objective: There were no vitals filed for this visit.  Vascular Examination:  Capillary fill time to digits <3s b/l, palpable DP pulses b/l, palpable PT pulses b/l, pedal hair present b/l and skin temperature gradient within normal limits b/l  Dermatological Examination: Pedal skin with normal turgor, texture and tone bilaterally. Toenails 1-5 b/l elongated, discolored, dystrophic, thickened, crumbly with subungual debris and tenderness to dorsal palpation. Incurvated nailplate b/l border(s) L hallux and R hallux.  Nail border hypertrophy absent. There is tenderness to palpation. Sign(s) of infection: no clinical signs of infection noted on examination today. Hyperkeratotic lesion(s) R 5th toe.  No erythema, no edema, no drainage, no flocculence.  Musculoskeletal: Normal muscle strength 5/5 to all lower extremity muscle groups bilaterally. No pain crepitus or joint limitation noted with ROM b/l. Hallux valgus with bunion deformity noted b/l lower extremities. Hammertoes noted to the R 5th toe.  Neurological: Pt has subjective symptoms of neuropathy. Protective sensation intact 5/5 intact bilaterally with 10g monofilament b/l. Vibratory sensation intact b/l.  Assessment: Pain due to onychomycosis of toenails of both feet  Corns  Hallux valgus, acquired, bilateral  Hammer toe of right foot  Diabetic peripheral neuropathy associated with type 2 diabetes mellitus  ([place])  Plan: -Patient examined. -No new findings. No new orders. -Continue diabetic foot care principles. -Toenails 1-5 b/l were debrided in length and girth without iatrogenic bleeding.Offending nail borders debrided and curretaged b/l great toes. Borders cleansed with alcohol. Antibiotic ointment applied. No further treatment required by patient. -Patient to continue soft, supportive shoe gear daily. -Corn(s) pared right 5th digit PIPJ utilizing sterile scalpel blade without incident. -Patient to report any pedal injuries to medical professional immediately. -Patient/POA to call should there be question/concern in the interim.  Return in about 9 weeks (around 11/11/2020).

## 2020-09-14 ENCOUNTER — Encounter: Payer: Self-pay | Admitting: Student

## 2020-09-14 ENCOUNTER — Ambulatory Visit (INDEPENDENT_AMBULATORY_CARE_PROVIDER_SITE_OTHER): Payer: Medicare Other | Admitting: Student

## 2020-09-14 ENCOUNTER — Other Ambulatory Visit: Payer: Self-pay

## 2020-09-14 ENCOUNTER — Other Ambulatory Visit: Payer: Self-pay | Admitting: Student

## 2020-09-14 VITALS — BP 199/97 | HR 85 | Temp 97.8°F | Ht 61.0 in | Wt 121.1 lb

## 2020-09-14 DIAGNOSIS — E118 Type 2 diabetes mellitus with unspecified complications: Secondary | ICD-10-CM

## 2020-09-14 DIAGNOSIS — J302 Other seasonal allergic rhinitis: Secondary | ICD-10-CM

## 2020-09-14 DIAGNOSIS — Z794 Long term (current) use of insulin: Secondary | ICD-10-CM

## 2020-09-14 DIAGNOSIS — I10 Essential (primary) hypertension: Secondary | ICD-10-CM | POA: Diagnosis not present

## 2020-09-14 DIAGNOSIS — E1159 Type 2 diabetes mellitus with other circulatory complications: Secondary | ICD-10-CM | POA: Diagnosis not present

## 2020-09-14 LAB — GLUCOSE, CAPILLARY: Glucose-Capillary: 237 mg/dL — ABNORMAL HIGH (ref 70–99)

## 2020-09-14 MED ORDER — CHLORTHALIDONE 25 MG PO TABS: 25.0000 mg | ORAL_TABLET | Freq: Every day | ORAL | 2 refills | Status: DC

## 2020-09-14 MED ORDER — AMLODIPINE BESYLATE 10 MG PO TABS: 10.0000 mg | ORAL_TABLET | Freq: Every day | ORAL | 2 refills | Status: DC

## 2020-09-14 MED ORDER — FLUTICASONE PROPIONATE 50 MCG/ACT NA SUSP: 2.0000 | Freq: Every day | NASAL | 2 refills | Status: DC

## 2020-09-14 MED ORDER — CETIRIZINE HCL 5 MG PO TABS: 5.0000 mg | ORAL_TABLET | Freq: Every day | ORAL | 1 refills | Status: DC

## 2020-09-14 MED FILL — FLUTICASONE PROP 50 MCG SPR: 50 | 30 days supply | Qty: 16 | Fill #0

## 2020-09-14 NOTE — Assessment & Plan Note (Signed)
Patient states that she has been under significant stress lately which she believes to be the reason for her high blood pressure. She reports that she has been taking her home amlodipine 5mg  daily without complaints, but she has only been taking chlorthalidone 25mg  once every four or five days as she thought that this medication was only to help relieve swelling in her ankles. She endorses chronic headache and blurry vision over the past three weeks. She denies chest pain, shortness of breath and palpitations.  Vitals:   09/14/20 1059 09/14/20 1107  BP: (!) 204/97 (!) 199/97  Pulse: 87 85  Temp: 97.8 F (36.6 C)   SpO2: 100%    Assessment/Plan Patient with very poorly controlled hypertension likely multifactorial (inadequate medication education and adherence, stress and increased salt intake). She would benefit from further education regarding her prescribed medications as well as escalation of her prescribed amlodipine. As we are changing medications today, we will hold off on checking labs until her next visit. -Increase amlodipine from 5 to 10mg  daily -Continue chlorthalidone 25mg . Patient has been educated on the importance of daily adherence -BMP in 4 weeks -Limit salt intake -Consider escalation of antihypertensive further as patient was previously on multiple antihypertensives

## 2020-09-14 NOTE — Progress Notes (Signed)
Internal Medicine Clinic Attending  I saw and evaluated the patient.  I personally confirmed the key portions of the history and exam documented by Dr. Johnson and I reviewed pertinent patient test results.  The assessment, diagnosis, and plan were formulated together and I agree with the documentation in the resident's note.  

## 2020-09-14 NOTE — Assessment & Plan Note (Signed)
Patient reporting symptoms of runny nose, sneezing and congestion over the past few months. She reports that her symptoms are typical for her seasonal allergies, however she has not been taking medication for these symptoms. She was previously prescribed zyrtec and flonase which she reports helped her symptoms. -Refill Zyrtec -Refill Flonase

## 2020-09-14 NOTE — Patient Instructions (Addendum)
Dear Ms. Harries,  It was a pleasure meeting you today in clinic today.  For your hypertension, please take the chlorthalidone 25mg  tablet every day regardless of your fluid status. Also, we will increase your amlodipine to 10mg  daily. Since you have plenty of the 5mg  tablets, you can take two of these every day until you run out, then pick up your new prescription. Also, continue working on limiting your salt intake as this can contribute to an elevated blood pressure.  For your diabetes, continue taking metformin 1,000mg  twice daily for now. We will check a hemoglobin A1c at your next visit and adjust medications according at that time.  For your seasonal allergies, I have refilled the Flonase and Zyrtec prescriptions which you previously used.  Otherwise, if you have any questions or concerns, please call our clinic and we would be happy to help!  Sincerely, Dr. Fausto Skillern, MD

## 2020-09-14 NOTE — Assessment & Plan Note (Signed)
Patient not due for hemoglobin A1c check and has not brought her meter from home today. She reports that she has had multiple readings in the 200s over the past week. Unfortunately, her insurance has been unable to cover Ozempic, therefore she has not been on this medication for two months. Her only medication for her diabetes is metformin 1,000mg  twice daily which she has been adherent to without complication. -Continue metformin 1,000mg  twice daily -Hemoglobin A1c at next visit in 4 weeks -Consider escalation of medication regimen pending results with consideration of patient's financial limitations.

## 2020-09-14 NOTE — Progress Notes (Signed)
CC: HTN follow-up  HPI:  [name] is a 69 y.o. with past medical history of HTN, T2DM and HLD who presents to clinic for 2 month follow-up of chronic medical conditions. Refer to problem list for Assessment/Plan based charting of this encounter.  Past Medical History:  Diagnosis Date  . Allergy    SEASONAL  . Anemia   . Arthritis   . Cataract    PRESENT IN LEFT/ CATARACTS REMOVED FROM RIGHT  . Diabetes mellitus 1996  . DKA (diabetic ketoacidoses) ([place]) 01/02/2013  . Hyperlipidemia   . Hypertension   . Hypotension   . Influenza A (H1N1) 01/03/2013  . Neuromuscular disorder ([place])    NEUROPATHY  . SYNCOPE 05/10/2010   Qualifier: Diagnosis of  By: Marilynne Halsted RN, BSN, [place]qu[place]     Family History  Problem Relation Age of Onset  . Diabetes Mother   . Hypertension Mother   . Breast cancer Mother   . Aneurysm Father   . Diabetes Sister   . Hypertension Sister   . Kidney disease Sister        One Kidney transplant, one sister on HD  . Breast cancer Sister   . Colon cancer Neg Hx   . Esophageal cancer Neg Hx    Social History   Tobacco Use  . Smoking status: Never Smoker  . Smokeless tobacco: Never Used  Vaping Use  . Vaping Use: Never used  Substance Use Topics  . Alcohol use: No    Alcohol/week: 0.0 standard drinks  . Drug use: No   Review of Systems:  Endorses runny nose, sneezing, nasal congestion, headache and blurry vision. Denies chest pain, shortness of breath, palpitations.  Physical Exam:  Vitals:   09/14/20 1059 09/14/20 1107  BP: (!) 204/97 (!) 199/97  Pulse: 87 85  Temp: 97.8 F (36.6 C)   TempSrc: Oral   SpO2: 100%   Weight: 121 lb 1.6 oz (54.9 kg)   Height: 5\' 1"  (1.549 m)    Physical Exam Vitals reviewed.  Constitutional:      General: She is not in acute distress.    Appearance: Normal appearance.  HENT:     Head: Normocephalic and atraumatic.  Eyes:     Extraocular Movements: Extraocular movements intact.      Conjunctiva/sclera: Conjunctivae normal.  Cardiovascular:     Rate and Rhythm: Normal rate and regular rhythm.     Pulses: Normal pulses.     Heart sounds: Normal heart sounds.  Pulmonary:     Effort: Pulmonary effort is normal. No respiratory distress.     Breath sounds: Normal breath sounds.  Abdominal:     General: Abdomen is flat. Bowel sounds are normal.     Palpations: Abdomen is soft.     Tenderness: There is no abdominal tenderness.  Musculoskeletal:        General: No swelling. Normal range of motion.     Cervical back: Normal range of motion and neck supple.     Right lower leg: No edema.     Left lower leg: No edema.  Skin:    General: Skin is warm and dry.     Capillary Refill: Capillary refill takes less than 2 seconds.  Neurological:     General: No focal deficit present.     Mental Status: She is alert. Mental status is at baseline.  Psychiatric:        Mood and Affect: Mood normal.        Behavior:  Behavior normal.        Thought Content: Thought content normal.        Judgment: Judgment normal.    Assessment & Plan:   See Encounters Tab for problem based charting.  Patient seen with Dr. Jimmye Norman

## 2020-09-15 MED FILL — AMOXICILLIN 500 MG CAPSULE: 500 | 8 days supply | Qty: 24 | Fill #0

## 2020-09-16 MED FILL — AMLODIPINE BESYLATE 10 MG T: 10 | 30 days supply | Qty: 30 | Fill #0

## 2020-09-21 MED FILL — LATANOPROST 0.005% EYE DRP: 0.005 | 25 days supply | Qty: 3 | Fill #1

## 2020-09-22 ENCOUNTER — Telehealth: Payer: Medicare Other

## 2020-09-23 ENCOUNTER — Ambulatory Visit: Payer: Medicare Other

## 2020-09-23 DIAGNOSIS — E118 Type 2 diabetes mellitus with unspecified complications: Secondary | ICD-10-CM

## 2020-09-23 DIAGNOSIS — I10 Essential (primary) hypertension: Secondary | ICD-10-CM

## 2020-09-23 DIAGNOSIS — E785 Hyperlipidemia, unspecified: Secondary | ICD-10-CM

## 2020-09-23 MED FILL — CHLORTHALIDONE 25 MG TABS: 25 | 30 days supply | Qty: 30 | Fill #0

## 2020-09-23 NOTE — Patient Instructions (Signed)
Visit Information  Goals Addressed              This Visit's Progress   .  COMPLETED: "We really need a ramp for my husband" (pt-stated)        Current Barriers:  [name] Knowledge Barriers related to resources available for ramp due to spouse's physical limitations.  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    Case Manager Clinical Goal(s):  [name] Over the next 180 days, patient will work with BSW to address needs related to spouse's physical limitations/need for ramp.   . Over the next 180 days, BSW will collaborate with RN Care Manager to address care management and care coordination needs  Interventions:  [name] Message sent to Donnamae Jude with [facility] regarding status of referral.   . Received response from Ms. Lovena Le that patient requested paperwork be mailed again as she is unable to locate it . Closing goal of care plan as patient has been linked to requested resource  Patient Self Care Activities:  . Patient verbalizes understanding of plan to seek assistance through [facility] and Vocational Rehabilitation for ramp assistance.   . Self administers medications as prescribed . Performs ADL's independently . Performs IADL's independently  Please see past updates related to this goal by clicking on the "Past Updates" button in the selected goal        The patient has been provided with contact information for the care management team and has been advised to call with any health related questions or concerns.       Ronn Melena, [place] Coordination Social Worker [place] [phone]

## 2020-09-23 NOTE — Progress Notes (Signed)
Internal Medicine Clinic Attending  CCM services provided by the care management provider and their documentation were discussed with Dr. Konrad Penta. We reviewed the pertinent findings, urgent action items addressed by the resident and non-urgent items to be addressed by the PCP.  I agree with the assessment, diagnosis, and plan of care documented in the CCM and resident's note.  Axel Filler, MD 09/23/2020

## 2020-09-23 NOTE — Chronic Care Management (AMB) (Signed)
  Care Management   Follow Up Note   09/23/2020 Name: Amanda [name] MRN: [id number] DOB: [birth year]  Referred by: Cato Mulligan, MD Reason for referral : Care Coordination (community resources)   Amanda [name] is a 69 y.o. year old female who is a primary care patient of Cato Mulligan, MD. The care management team was consulted for assistance with care management and care coordination needs.    Review of patient status, including review of consultants reports, relevant laboratory and other test results, and collaboration with appropriate care team members and the patient's provider was performed as part of comprehensive patient evaluation and provision of chronic care management services.    SDOH (Social Determinants of Health) assessments performed: No See Care Plan activities for detailed interventions related to [facility])     Advanced Directives: See Care Plan and Vynca application for related entries.   Goals Addressed              This Visit's Progress   .  COMPLETED: "We really need a ramp for my husband" (pt-stated)        Current Barriers:  [name] Knowledge Barriers related to resources available for ramp due to spouse's physical limitations.  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    Case Manager Clinical Goal(s):  [name] Over the next 180 days, patient will work with BSW to address needs related to spouse's physical limitations/need for ramp.   . Over the next 180 days, BSW will collaborate with RN Care Manager to address care management and care coordination needs  Interventions:  [name] Message sent to Donnamae Jude with [facility] regarding status of referral.   . Received response from Ms. Lovena Le that patient requested paperwork be mailed again as she is unable to locate it . Closing goal of care plan as patient has been linked to requested resource  Patient Self Care  Activities:  . Patient verbalizes understanding of plan to seek assistance through [facility] and [facility] for ramp assistance.   . Self administers medications as prescribed . Performs ADL's independently . Performs IADL's independently  Please see past updates related to this goal by clicking on the "Past Updates" button in the selected goal          The patient has been provided with contact information for the care management team and has been advised to call with any health related questions or concerns.       Ronn Melena, [place] Coordination Social Worker [place] [phone]

## 2020-09-23 NOTE — Progress Notes (Signed)
Internal Medicine Clinic Resident  I have personally reviewed this encounter including the documentation in this note and/or discussed this patient with the care management provider. I will address any urgent items identified by the care management provider and will communicate my actions to the patient's PCP. I have reviewed the patient's CCM visit with my supervising attending, Dr Evette Doffing.  Jeralyn Bennett, MD 09/23/2020

## 2020-09-28 ENCOUNTER — Ambulatory Visit: Payer: Medicare Other | Admitting: *Deleted

## 2020-09-28 DIAGNOSIS — E785 Hyperlipidemia, unspecified: Secondary | ICD-10-CM

## 2020-09-28 DIAGNOSIS — I10 Essential (primary) hypertension: Secondary | ICD-10-CM

## 2020-09-28 DIAGNOSIS — E118 Type 2 diabetes mellitus with unspecified complications: Secondary | ICD-10-CM

## 2020-09-28 NOTE — Chronic Care Management (AMB) (Addendum)
Chronic Care Management   Follow Up Note   09/28/2020 Name: Amanda [name] MRN: [id number] DOB: 1951-12-03  Referred by: Cato Mulligan, MD Reason for referral : Chronic Care Management (NIDDM, CKD, HLD, Depression)   Amanda [name] is a 69 y.o. year old female who is a primary care patient of Cato Mulligan, MD. The CCM team was consulted for assistance with chronic disease management and care coordination needs.    Review of patient status, including review of consultants reports, relevant laboratory and other test results, and collaboration with appropriate care team members and the patient's provider was performed as part of comprehensive patient evaluation and provision of chronic care management services.    SDOH (Social Determinants of Health) assessments performed: No See Care Plan activities for detailed interventions related to [facility])     Outpatient Encounter Medications as of 09/28/2020  Medication Sig Note  . ACCU-CHEK AVIVA PLUS test strip USE AS DIRECTED UP TO FOUR TIMES DAILY   . Accu-Chek Softclix Lancets lancets USE AS DIRECTED UP TO FOUR TIMES DAILY   . acetaminophen (TYLENOL) 325 MG tablet Take 2 tablets (650 mg total) by mouth every 6 (six) hours as needed (or Fever >/= 101).   [name] amLODipine (NORVASC) 10 MG tablet Take 1 tablet (10 mg total) by mouth daily.   [name] atorvastatin (LIPITOR) 40 MG tablet TAKE 1 TABLET (40 MG TOTAL) BY MOUTH DAILY AT 6 PM.   . chlorthalidone (HYGROTON) 25 MG tablet Take 1 tablet (25 mg total) by mouth daily.   . metFORMIN (GLUCOPHAGE-XR) 500 MG 24 hr tablet TAKE 2 TABLETS (1,000 MG TOTAL) BY MOUTH 2 (TWO) TIMES DAILY WITH A MEAL.   . Multiple Vitamins-Minerals (WOMENS 50+ MULTI VITAMIN/MIN) TABS Take 1 tablet by mouth daily.   [name] amoxicillin (AMOXIL) 500 MG capsule Take 500 mg by mouth 3 (three) times daily. (Patient not taking: Reported on 09/28/2020)   . blood glucose meter kit and supplies KIT Dispense based on patient and insurance  preference. Use up to four times daily as directed. .   . Capsaicin-Menthol-Methyl Sal (CAPSAICIN-METHYL SAL-MENTHOL) 0.025-1-12 % CREA Apply 1 application topically as needed. Apply over painful areas   . cetirizine (ZYRTEC) 5 MG tablet Take 1 tablet (5 mg total) by mouth daily.   . Cholecalciferol (VITAMIN D3) 2000 units capsule Take by mouth daily.    . diclofenac Sodium (VOLTAREN) 1 % GEL Apply topically. 04/08/2020: Uses prn  . Ferrous Sulfate (IRON) 325 (65 Fe) MG TABS Take 1 tablet (325 mg total) by mouth daily.   . fluticasone (FLONASE) 50 MCG/ACT nasal spray Place 2 sprays into both nostrils daily.   [name] latanoprost (XALATAN) 0.005 % ophthalmic solution 1 drop at bedtime. 09/10/2019: LF 6.4.20 #2.5/30  . Semaglutide,0.25 or 0.5MG/DOS, (OZEMPIC, 0.25 OR 0.5 MG/DOSE,) 2 MG/1.5ML SOPN Inject 0.5 mg into the skin once a week. (Patient not taking: Reported on 09/28/2020) 09/28/2020: Cannot afford copay   No facility-administered encounter medications on file as of 09/28/2020.     Objective:  Wt Readings from Last 3 Encounters:  09/14/20 121 lb 1.6 oz (54.9 kg)  07/03/20 123 lb 9.6 oz (56.1 kg)  06/10/20 119 lb 2 oz (54 kg)    Goals Addressed              This Visit's Progress     Patient Stated   .  COMPLETED: " I like taking the Ozempic for my diabetes but need help with the copay." (pt-stated)  CARE PLAN ENTRY (see longitudinal plan of care for additional care plan information)  Current Barriers:  . Difficulty obtaining medications- spoke with patient; she cannot afford Ozempic copay and has not completed the patient assistance program paperwork, clinic provider to discuss preferred health plan DM medications with patient during 10/12/20 clinic visit  Nurse Case Manager Clinical Goal(s):  [name] Over the next 30-60  days, patient will work with CCM team to address needs related to completing Nov Nordisk patient assistance application form for [employer].  Interventions:   . Inter-disciplinary care team collaboration (see longitudinal plan of care) . Again reviewed other classes of diabetes medications that are less cost prohibitive that might be an option if she is not approved for copy assistance with Ozempic   Patient Self Care Activities:  . Completes Ozempic patient assistance form with help of health care team. . Unable to independently complete Ozempic patient assistance form and return complete form to pharmacy tech.  Please see past updates related to this goal by clicking on the "Past Updates" button in the selected goal      .  " My blood sugar is running high and I didn't realize I was supposed to be checking my blood pressure everyday." (pt-stated)        CARE PLAN ENTRY (see longitudinal plan of care for additional care plan information)  Current Barriers:  . Chronic Disease Management support, education, and care coordination needs related to HTN, HLD, DMII, and CKD - patient states blood sugars are higher as she is only taking Metformin because she cannot afford th Ozempic copay. She says her fasting blood sugar this morning was 310. She says she made the changed to her HTN treatment plan as directed by Dr Wynetta Emery ion 9/27 including increasing her amlodipine to 10 mg daily and taking her chorthalidone 25 mg everyday instead of prn for ankle swelling. She says she has not checked her blood pressure since she adjusted her HTN medications.  . Clinical Goal(s) related to HTN, HLD, DMII, and CKD Stage. Over the next 30- 60 days, patient will:  . Work with the care management team to address educational, disease management, and care coordination needs  . Begin or continue self health monitoring activities as directed today Measure and record CBG (blood glucose) 1-2 times daily and Measure and record blood pressure 5-7 times per week . Call provider office for new or worsened signs and symptoms Blood glucose findings outside established parameters,  Blood pressure findings outside established parameters, and New or worsened symptom related to HTN, HLD, DMII, and chronic diarrhea . Call care management team with questions or concerns . Verbalize basic understanding of patient centered plan of care established today  Interventions related to HTN, HLD, DMII, and CKD. [name] Appropriate assessments completed  . Inter-disciplinary care team collaboration (see longitudinal plan of care) . Evaluation of current treatment plan related to HTN and DM and patient's adherence to plan as established by provider. . Reviewed medications with patient and discussed medication taking behavior . Discussed importance of blood pressure self monitoring and reviewed how to monitor blood pressure at home. Encouraged her to write her daily blood pressure readings in the HTN section of her [place] [employer] spiral bound calendar that was mailed to her . Provided chronic disease management education- reviewed strategies to control blood pressure and blood sugar via lifestyle including drinking plenty of water, keeping sodium intake to <1 tsp per day, exercising, increasing consumption of fresh vegetables and  fruits, avoiding or limiting packaged foods and simple starches . Reminded her to refer to the information that was mailed to her: [place] brochure and [place] [facility] Exercise booklet and American Diabetes Association Ask the Experts Q&A Series information, "Living Well with Diabetes" and "Where Do I Begin" diabetes booklets  and Controlling Blood Pressure booklet for additional support in helping to control her blood pressure and blood sugar . Reminded her to bring her blood pressure monitor and glucometer to every clinic appointment. .  . Discussed plans with patient for ongoing care management follow up and provided patient with direct contact information for care management team  Patient Self Care Activities  related to HTN, HLD, DMII, and CKD. [name] Patient is unable to independently self-manage chronic health conditions  Please see past updates related to this goal by clicking on the "Past Updates" button in the selected goal        Other   .  Blood Pressure < 140/90        BP Readings from Last 3 Encounters:  09/14/20 (!) 199/97  07/03/20 (!) 177/87  06/10/20 (!) 152/84    Not meeting blood pressure targets     .  LDL CALC < 100        Lab Results  Component Value Date   CHOL 127 09/10/2019   HDL 54 09/10/2019   LDLCALC 54 09/10/2019   TRIG 115 09/13/2019   CHOLHDL 2.4 09/10/2019    Meeting lipid targets      Please consider updating lipid panel at clinic visit on 10/12/20   Plan:   The care management team will reach out to the patient again over the next 30-60 days.    [name] RN, CCM, [facility] RN Care Manager [phone]

## 2020-09-28 NOTE — Patient Instructions (Signed)
Visit Information It was nice speaking with you today. Goals Addressed              This Visit's Progress     Patient Stated   .  COMPLETED: " I like taking the Ozempic for my diabetes but need help with the copay." (pt-stated)        CARE PLAN ENTRY (see longitudinal plan of care for additional care plan information)  Current Barriers:  . Difficulty obtaining medications- spoke with patient; she cannot afford Ozempic copay and has not completed the patient assistance program paperwork, clinic provider to discuss preferred health plan DM medications with patient during 10/12/20 clinic visit  Nurse Case Manager Clinical Goal(s):  [name] Over the next 30-60  days, patient will work with CCM team to address needs related to completing Nov Nordisk patient assistance application form for [employer].  Interventions:  . Inter-disciplinary care team collaboration (see longitudinal plan of care) . Again reviewed other classes of diabetes medications that are less cost prohibitive that might be an option if she is not approved for copy assistance with Ozempic   Patient Self Care Activities:  . Completes Ozempic patient assistance form with help of health care team. . Unable to independently complete Ozempic patient assistance form and return complete form to pharmacy tech.  Please see past updates related to this goal by clicking on the "Past Updates" button in the selected goal      .  " My blood sugar is running high and I didn't realize I was supposed to be checking my blood pressure everyday." (pt-stated)        CARE PLAN ENTRY (see longitudinal plan of care for additional care plan information)  Current Barriers:  . Chronic Disease Management support, education, and care coordination needs related to HTN, HLD, DMII, and CKD - patient states blood sugars are higher as she is only taking Metformin because she cannot afford th Ozempic copay. She says her fasting blood sugar this morning was 310.  She says she made the changed to her HTN treatment plan as directed by Dr Wynetta Emery ion 9/27 including increasing her amlodipine to 10 mg daily and taking her chorthalidone 25 mg everyday instead of prn for ankle swelling. She says she has not checked her blood pressure since she adjusted her HTN medications.  . Clinical Goal(s) related to HTN, HLD, DMII, and CKD Stage. Over the next 30- 60 days, patient will:  . Work with the care management team to address educational, disease management, and care coordination needs  . Begin or continue self health monitoring activities as directed today Measure and record CBG (blood glucose) 1-2 times daily and Measure and record blood pressure 5-7 times per week . Call provider office for new or worsened signs and symptoms Blood glucose findings outside established parameters, Blood pressure findings outside established parameters, and New or worsened symptom related to HTN, HLD, DMII, and chronic diarrhea . Call care management team with questions or concerns . Verbalize basic understanding of patient centered plan of care established today  Interventions related to HTN, HLD, DMII, and CKD. [name] Appropriate assessments completed  . Inter-disciplinary care team collaboration (see longitudinal plan of care) . Evaluation of current treatment plan related to HTN and DM and patient's adherence to plan as established by provider. . Reviewed medications with patient and discussed medication taking behavior . Discussed importance of blood pressure self monitoring and reviewed how to monitor blood pressure at home. Encouraged her to write her  daily blood pressure readings in the HTN section of her [place] [employer] spiral bound calendar that was mailed to her . Provided chronic disease management education- reviewed strategies to control blood pressure and blood sugar via lifestyle including drinking plenty of water, keeping sodium intake to <1 tsp per  day, exercising, increasing consumption of fresh vegetables and fruits, avoiding or limiting packaged foods and simple starches . Reminded her to refer to the information that was mailed to her: [place] brochure and [place] Management Exercise booklet and American Diabetes Association Ask the Experts Q&A Series information, "Living Well with Diabetes" and "Where Do I Begin" diabetes booklets  and Controlling Blood Pressure booklet for additional support in helping to control her blood pressure and blood sugar . Reminded her to bring her blood pressure monitor and glucometer to every clinic appointment. .  . Discussed plans with patient for ongoing care management follow up and provided patient with direct contact information for care management team  Patient Self Care Activities related to HTN, HLD, DMII, and CKD. [name] Patient is unable to independently self-manage chronic health conditions  Please see past updates related to this goal by clicking on the "Past Updates" button in the selected goal        Other   .  Blood Pressure < 140/90        BP Readings from Last 3 Encounters:  09/14/20 (!) 199/97  07/03/20 (!) 177/87  06/10/20 (!) 152/84    Not meeting blood pressure targets     .  LDL CALC < 100        Lab Results  Component Value Date   CHOL 127 09/10/2019   HDL 54 09/10/2019   LDLCALC 54 09/10/2019   TRIG 115 09/13/2019   CHOLHDL 2.4 09/10/2019    Meeting lipid targets        The patient verbalized understanding of instructions provided today and declined a print copy of patient instruction materials.   The care management team will reach out to the patient again over the next 30-60 days.   [name] RN, CCM, [facility] RN Care Manager [phone]

## 2020-09-29 NOTE — Progress Notes (Addendum)
Internal Medicine Clinic Resident  I have personally reviewed this encounter including the documentation in this note and/or discussed this patient with the care management provider. I will address any urgent items identified by the care management provider and will communicate my actions to the patient's PCP. I have reviewed the patient's CCM visit with my supervising attending, Dr Jimmye Norman.  Iona Beard, MD 09/29/2020   Internal Medicine Attending attestation: I agree with the findings and plan of this CCM encounter. Dorian Pod, MD

## 2020-10-07 ENCOUNTER — Other Ambulatory Visit: Payer: Self-pay

## 2020-10-07 ENCOUNTER — Ambulatory Visit (INDEPENDENT_AMBULATORY_CARE_PROVIDER_SITE_OTHER): Payer: Medicare Other

## 2020-10-07 ENCOUNTER — Ambulatory Visit
Admission: EM | Admit: 2020-10-07 | Discharge: 2020-10-07 | Disposition: A | Discharge status: Home / Self Care | Payer: Medicare Other | Attending: Physician Assistant | Admitting: Physician Assistant

## 2020-10-07 DIAGNOSIS — S6991XA Unspecified injury of right wrist, hand and finger(s), initial encounter: Secondary | ICD-10-CM | POA: Diagnosis not present

## 2020-10-07 DIAGNOSIS — S6010XA Contusion of unspecified finger with damage to nail, initial encounter: Secondary | ICD-10-CM | POA: Diagnosis not present

## 2020-10-07 DIAGNOSIS — S67190A Crushing injury of right index finger, initial encounter: Secondary | ICD-10-CM | POA: Diagnosis not present

## 2020-10-07 DIAGNOSIS — W230XXA Caught, crushed, jammed, or pinched between moving objects, initial encounter: Secondary | ICD-10-CM | POA: Diagnosis not present

## 2020-10-07 DIAGNOSIS — M79644 Pain in right finger(s): Secondary | ICD-10-CM | POA: Diagnosis not present

## 2020-10-07 NOTE — ED Triage Notes (Signed)
Pt stated that she slammed her right thumb in a car door and she has experienced pain. Pt states it was swollen last night but has gone down. Pt is aox4 and ambulatory.

## 2020-10-07 NOTE — ED Provider Notes (Signed)
[facility]    CSN: [id number] Arrival date & time: 10/07/20  1425      History   Chief Complaint Chief Complaint  Patient presents with  . Finger Injury    yesterday    HPI Amanda [name] is a 69 y.o. female.   69 year old female comes in for right thumb pain after injury yesterday. Slammed finger when closing car door. Pain with throbbing sensation. Occasional with tingling sensation. Decreased ROM due to pain and swelling. States swelling has slightly improved.      Past Medical History:  Diagnosis Date  . Allergy    SEASONAL  . Anemia   . Arthritis   . Cataract    PRESENT IN LEFT/ CATARACTS REMOVED FROM RIGHT  . Diabetes mellitus 1996  . DKA (diabetic ketoacidoses) 01/02/2013  . Hyperlipidemia   . Hypertension   . Hypotension   . Influenza A (H1N1) 01/03/2013  . Neuromuscular disorder ([place])    NEUROPATHY  . SYNCOPE 05/10/2010   Qualifier: Diagnosis of  By: Marilynne Halsted RN, BSN, Jacquelyn      Patient Active Problem List   Diagnosis Date Noted  . Chronic diarrhea 06/10/2020  . Respiratory failure ([place])   . Hypertensive emergency   . Gait abnormality 01/10/2019  . Near syncope 11/26/2018  . Nail fungus 10/23/2017  . Cervical radiculopathy due to degenerative joint disease of spine 05/03/2017  . 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 Pain of both shoulder joints 11/14/2016  . Orthostatic hypotension 09/15/2016  . Vitreous hemorrhage of left eye ([place]) 05/13/2016  . Osteopenia 05/04/2016  . Posterior capsular opacification of left eye, obscuring vision 02/15/2016  . MDD (major depressive disorder), recurrent episode, moderate ([place]) 02/08/2016  . Pseudophakia of both eyes 07/27/2015  . Diabetic neuropathy ([place]) 05/20/2013  . Type 2 diabetes mellitus with both eyes affected by proliferative retinopathy and macular edema, with long-term current use of insulin ([place]) 04/19/2013  . Seasonal allergies 03/12/2013  . Dyslipidemia 02/15/2013  . DM II  (diabetes mellitus, type II), controlled ([place]) 02/14/2013  . Normocytic anemia 02/14/2013  . Essential hypertension 05/10/2010    Past Surgical History:  Procedure Laterality Date  . ABDOMINAL HYSTERECTOMY     10 years ago   . BREAST BIOPSY    . Cataracts    . ROTATOR CUFF REPAIR    . SHOULDER SURGERY     Rotator cuff tear     OB History    Gravida  1   Para  1   Term  1   Preterm      AB      Living  1     SAB      TAB      Ectopic      Multiple      Live Births               Home Medications    Prior to Admission medications   Medication Sig Start Date End Date Taking? Authorizing Provider  ACCU-CHEK AVIVA PLUS test strip USE AS DIRECTED UP TO FOUR TIMES DAILY 07/14/20  Yes [place], Charisse March, MD  Accu-Chek Softclix Lancets lancets USE AS DIRECTED UP TO FOUR TIMES DAILY 02/07/20  Yes [name], MD  acetaminophen (TYLENOL) 325 MG tablet Take 2 tablets (650 mg total) by mouth every 6 (six) hours as needed (or Fever >/= 101). 01/05/13  Yes Dorian Heckle, MD  amLODipine (NORVASC) 10 MG tablet Take 1 tablet (10 mg total)  by mouth daily. 09/14/20 12/13/20 Yes Cato Mulligan, MD  atorvastatin (LIPITOR) 40 MG tablet TAKE 1 TABLET (40 MG TOTAL) BY MOUTH DAILY AT 6 PM. 05/21/20  Yes [name], [name], MD  blood glucose meter kit and supplies KIT Dispense based on patient and insurance preference. Use up to four times daily as directed. . 09/30/19  Yes [name], MD  cetirizine (ZYRTEC) 5 MG tablet Take 1 tablet (5 mg total) by mouth daily. 09/14/20  Yes Cato Mulligan, MD  Ferrous Sulfate (IRON) 325 (65 Fe) MG TABS Take 1 tablet (325 mg total) by mouth daily. 09/24/19  Yes Vann, Jessica U, DO  fluticasone (FLONASE) 50 MCG/ACT nasal spray Place 2 sprays into both nostrils daily. 09/14/20  Yes Cato Mulligan, MD  latanoprost (XALATAN) 0.005 % ophthalmic solution 1 drop at bedtime.   Yes [provider]  metFORMIN (GLUCOPHAGE-XR) 500 MG 24  hr tablet TAKE 2 TABLETS (1,000 MG TOTAL) BY MOUTH 2 (TWO) TIMES DAILY WITH A MEAL. 05/27/20  Yes [name], [name], MD  chlorthalidone (HYGROTON) 25 MG tablet Take 1 tablet (25 mg total) by mouth daily. 09/14/20 12/13/20  Cato Mulligan, MD  Cholecalciferol (VITAMIN D3) 2000 units capsule Take by mouth daily.     [provider]  diclofenac Sodium (VOLTAREN) 1 % GEL Apply topically. 09/08/16   [provider]  Multiple Vitamins-Minerals (WOMENS 50+ MULTI VITAMIN/MIN) TABS Take 1 tablet by mouth daily. 03/18/14   Lucious Groves, DO  Semaglutide,0.25 or 0.5MG/DOS, (OZEMPIC, 0.25 OR 0.5 MG/DOSE,) 2 MG/1.5ML SOPN Inject 0.5 mg into the skin once a week. Patient not taking: Reported on 09/28/2020 05/06/20   [name], MD    Family History Family History  Problem Relation Age of Onset  . Diabetes Mother   . Hypertension Mother   . Breast cancer Mother   . Aneurysm Father   . Diabetes Sister   . Hypertension Sister   . Kidney disease Sister        One Kidney transplant, one sister on HD  . Breast cancer Sister   . Colon cancer Neg Hx   . Esophageal cancer Neg Hx     Social History Social History   Tobacco Use  . Smoking status: Never Smoker  . Smokeless tobacco: Never Used  Vaping Use  . Vaping Use: Never used  Substance Use Topics  . Alcohol use: No    Alcohol/week: 0.0 standard drinks  . Drug use: No     Allergies   Gabapentin   Review of Systems Review of Systems  Reason unable to perform ROS: See HPI as above.     Physical Exam Triage Vital Signs ED Triage Vitals  Enc Vitals Group     BP 10/07/20 1438 (!) 162/79     Pulse Rate 10/07/20 1438 89     Resp 10/07/20 1438 18     Temp 10/07/20 1438 98.3 F (36.8 C)     Temp Source 10/07/20 1438 Oral     SpO2 10/07/20 1438 97 %     Weight --      Height --      Head Circumference --      Peak Flow --      Pain Score 10/07/20 1500 8     Pain Loc --      Pain Edu? --      Excl. in [place]?  --    No data found.  Updated Vital Signs BP 137/77 (BP Location: Left  Arm)   Pulse 87   Temp 98 F (36.7 C) (Oral)   Resp 17   SpO2 97%   Physical Exam Constitutional:      General: She is not in acute distress.    Appearance: Normal appearance. She is well-developed. She is not toxic-appearing or diaphoretic.  HENT:     Head: Normocephalic and atraumatic.  Eyes:     Conjunctiva/sclera: Conjunctivae normal.     Pupils: Pupils are equal, round, and reactive to light.  Pulmonary:     Effort: Pulmonary effort is normal. No respiratory distress.  Musculoskeletal:     Cervical back: Normal range of motion and neck supple.     Comments: Right thumb DIP swelling with subungual hematoma.  No open wound.  Diffuse tenderness to palpation of right thumb with max tenderness to the DIP.  Decreased range of motion. NVI  Skin:    General: Skin is warm and dry.  Neurological:     Mental Status: She is alert and oriented to person, place, and time.      UC Treatments / Results  Labs (all labs ordered are listed, but only abnormal results are displayed) Labs Reviewed - No data to display  EKG   Radiology DG Finger Thumb Right  Result Date: 10/07/2020 CLINICAL DATA:  Thumb injury shut in car door EXAM: RIGHT THUMB 2+V COMPARISON:  None. FINDINGS: There is no evidence of fracture or dislocation. There is no evidence of arthropathy or other focal bone abnormality. Soft tissues are unremarkable. IMPRESSION: Negative. Electronically Signed   By: Donavan Foil M.D.   On: 10/07/2020 15:47    Procedures Incision and Drainage  Date/Time: 10/07/2020 4:19 PM Performed by: Ok Edwards, PA-C Authorized by: Ok Edwards, PA-C   Consent:    Consent obtained:  Verbal   Consent given by:  Patient   Risks discussed:  Incomplete drainage and infection   Alternatives discussed:  No treatment Location:    Type:  Subungual hematoma Comments:     Trephination done using cautery pen. Moderate  bloody drainage. Area dressed and splinted. Patient tolerated procedure well with no immediate complications.   (including critical care time)  Medications Ordered in UC Medications - No data to display  Initial Impression / Assessment and Plan / UC Course  I have reviewed the triage vital signs and the nursing notes.  Pertinent labs & imaging results that were available during my care of the patient were reviewed by me and considered in my medical decision making (see chart for details).    X-ray negative for fracture or dislocation.  Patient tolerated trephination well with good relief of symptoms.  Area splinted for comfort.  Symptomatic treatment discussed.  Return precautions given.  Patient expresses understanding and agrees to plan.  Final Clinical Impressions(s) / UC Diagnoses   Final diagnoses:  Thumb pain, right  Subungual hematoma of digit of hand, initial encounter   ED Prescriptions    None     PDMP not reviewed this encounter.   Ok Edwards, PA-C 10/07/20 1621

## 2020-10-07 NOTE — Discharge Instructions (Signed)
Xray negative. Splint for comfort. Continue to massage as tolerated to help with drainage. This can take a few weeks to completely resolve, but should be feeling better each week. Follow up with PCP if symptoms not improving.

## 2020-10-12 ENCOUNTER — Other Ambulatory Visit: Payer: Self-pay | Admitting: Internal Medicine

## 2020-10-12 ENCOUNTER — Ambulatory Visit: Payer: Medicare Other | Admitting: *Deleted

## 2020-10-12 ENCOUNTER — Other Ambulatory Visit: Payer: Self-pay

## 2020-10-12 ENCOUNTER — Ambulatory Visit (INDEPENDENT_AMBULATORY_CARE_PROVIDER_SITE_OTHER): Payer: Medicare Other | Admitting: Internal Medicine

## 2020-10-12 VITALS — BP 176/70 | HR 79 | Wt 119.8 lb

## 2020-10-12 DIAGNOSIS — E782 Mixed hyperlipidemia: Secondary | ICD-10-CM

## 2020-10-12 DIAGNOSIS — E113513 Type 2 diabetes mellitus with proliferative diabetic retinopathy with macular edema, bilateral: Secondary | ICD-10-CM | POA: Diagnosis not present

## 2020-10-12 DIAGNOSIS — E785 Hyperlipidemia, unspecified: Secondary | ICD-10-CM | POA: Diagnosis not present

## 2020-10-12 DIAGNOSIS — I10 Essential (primary) hypertension: Secondary | ICD-10-CM | POA: Diagnosis not present

## 2020-10-12 DIAGNOSIS — E611 Iron deficiency: Secondary | ICD-10-CM

## 2020-10-12 DIAGNOSIS — Z23 Encounter for immunization: Secondary | ICD-10-CM

## 2020-10-12 DIAGNOSIS — E1165 Type 2 diabetes mellitus with hyperglycemia: Secondary | ICD-10-CM

## 2020-10-12 DIAGNOSIS — Z794 Long term (current) use of insulin: Secondary | ICD-10-CM | POA: Diagnosis not present

## 2020-10-12 DIAGNOSIS — E118 Type 2 diabetes mellitus with unspecified complications: Secondary | ICD-10-CM

## 2020-10-12 LAB — POCT GLYCOSYLATED HEMOGLOBIN (HGB A1C): Hemoglobin A1C: 10.5 % — AB (ref 4.0–5.6)

## 2020-10-12 LAB — GLUCOSE, CAPILLARY: Glucose-Capillary: 375 mg/dL — ABNORMAL HIGH (ref 70–99)

## 2020-10-12 MED ORDER — GLIPIZIDE 5 MG PO TABS: 2.5000 mg | ORAL_TABLET | Freq: Every day | ORAL | 0 refills | Status: DC

## 2020-10-12 MED FILL — glipiZIDE 5 MG TABS: 5 | 30 days supply | Qty: 15 | Fill #0

## 2020-10-12 NOTE — Patient Instructions (Signed)
Visit Information It was nice meeting you today. Goals Addressed              This Visit's Progress     Patient Stated   .  " My blood sugar is running high and I didn't realize I was supposed to be checking my blood pressure everyday." (pt-stated)        CARE PLAN ENTRY (see longitudinal plan of care for additional care plan information)  Current Barriers:  . 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 Clinical Goal(s) related to HTN, HLD, DMII, and CKD. Over the next 30- 60 days, patient will:  . Work with the care management team to address educational, disease management, and care coordination needs  . Begin or continue self health monitoring activities as directed today Measure and record CBG (blood glucose) 1-2 times daily and Measure and record blood pressure 5-7 times per week . Call provider office for new or worsened signs and symptoms Blood glucose findings outside established parameters, Blood pressure findings outside established parameters, and New or worsened symptom related to HTN, HLD, DMII, and chronic diarrhea . Call care management team with questions or concerns . Verbalize basic understanding of patient centered plan of care established today  Interventions related to HTN, HLD, DMII, and CKD. [name] Appropriate assessments completed  . Inter-disciplinary care team collaboration (see longitudinal plan of care) . Evaluation of current treatment plan related to HTN and DM and patient's adherence to plan as established by provider. . Reviewed medication changes to DM treatment plan. Discussed mechanism of action or Glipizide and advised  patient to eat within 30 minutes of taking medication and to eat at regular intervals to lower risk of hypoglycemia. . Reminded patient their are community resources that address caregiver stress and advised her to notify CCM BSW if she is interested . Provided patient with [place] [facility] spiral bound day planner with business cards of this CCM RN and the [place] . Suggested patient log her blood sugars and blood pressures in the [place] [facility]  day planner in the appropriate sections in the back . Discussed plans with patient for ongoing care management follow up and provided patient with direct contact information for care management team  Patient Self Care Activities related to HTN, HLD, DMII, and CKD. [name] Patient is unable to independently self-manage chronic health conditions  Please see past updates related to this goal by clicking on the "Past Updates" button in the selected goal         The patient verbalized understanding of instructions provided today and declined a print copy of patient instruction materials.   The care management team will reach out to the patient again over the next 30-60 days.   [name] RN, CCM, [facility] RN Care Manager [phone]

## 2020-10-12 NOTE — Progress Notes (Signed)
CC: DM   HPI:  [name] is a 69 y.o. female with a past medical history stated below and presents today for DM. Please see problem based assessment and plan for additional details.  Past Medical History:  Diagnosis Date  . Allergy    SEASONAL  . Anemia   . Arthritis   . Cataract    PRESENT IN LEFT/ CATARACTS REMOVED FROM RIGHT  . Diabetes mellitus 1996  . DKA (diabetic ketoacidoses) 01/02/2013  . Hyperlipidemia   . Hypertension   . Hypotension   . Influenza A (H1N1) 01/03/2013  . Neuromuscular disorder ([place])    NEUROPATHY  . SYNCOPE 05/10/2010   Qualifier: Diagnosis of  By: Marilynne Halsted RN, BSN, Jacquelyn      Current Outpatient Medications on File Prior to Visit  Medication Sig Dispense Refill  . ACCU-CHEK AVIVA PLUS test strip USE AS DIRECTED UP TO FOUR TIMES DAILY 200 strip 0  . Accu-Chek Softclix Lancets lancets USE AS DIRECTED UP TO FOUR TIMES DAILY 200 each 0  . acetaminophen (TYLENOL) 325 MG tablet Take 2 tablets (650 mg total) by mouth every 6 (six) hours as needed (or Fever >/= 101). 60 tablet 0  . amLODipine (NORVASC) 10 MG tablet Take 1 tablet (10 mg total) by mouth daily. 30 tablet 2  . atorvastatin (LIPITOR) 40 MG tablet TAKE 1 TABLET (40 MG TOTAL) BY MOUTH DAILY AT 6 PM. 90 tablet 1  . blood glucose meter kit and supplies KIT Dispense based on patient and insurance preference. Use up to four times daily as directed. . 1 each 0  . cetirizine (ZYRTEC) 5 MG tablet Take 1 tablet (5 mg total) by mouth daily. 30 tablet 1  . chlorthalidone (HYGROTON) 25 MG tablet Take 1 tablet (25 mg total) by mouth daily. 30 tablet 2  . Cholecalciferol (VITAMIN D3) 2000 units capsule Take by mouth daily.     . diclofenac Sodium (VOLTAREN) 1 % GEL Apply topically.    . Ferrous Sulfate (IRON) 325 (65 Fe) MG TABS Take 1 tablet (325 mg total) by mouth daily. 30 tablet 0  . fluticasone (FLONASE) 50 MCG/ACT nasal spray Place 2 sprays into both nostrils daily. 16 g 2  . latanoprost  (XALATAN) 0.005 % ophthalmic solution 1 drop at bedtime.    . metFORMIN (GLUCOPHAGE-XR) 500 MG 24 hr tablet TAKE 2 TABLETS (1,000 MG TOTAL) BY MOUTH 2 (TWO) TIMES DAILY WITH A MEAL. 360 tablet 1  . Multiple Vitamins-Minerals (WOMENS 50+ MULTI VITAMIN/MIN) TABS Take 1 tablet by mouth daily. 30 tablet   . Semaglutide,0.25 or 0.5MG/DOS, (OZEMPIC, 0.25 OR 0.5 MG/DOSE,) 2 MG/1.5ML SOPN Inject 0.5 mg into the skin once a week. (Patient not taking: Reported on 09/28/2020) 3 pen 2   No current facility-administered medications on file prior to visit.    Family History  Problem Relation Age of Onset  . Diabetes Mother   . Hypertension Mother   . Breast cancer Mother   . Aneurysm Father   . Diabetes Sister   . Hypertension Sister   . Kidney disease Sister        One Kidney transplant, one sister on HD  . Breast cancer Sister   . Colon cancer Neg Hx   . Esophageal cancer Neg Hx     Social History   Socioeconomic History  . Marital status: Married    Spouse name: Not on file  . Number of children: 1  . Years of education: Not on file  .  Highest education level: Not on file  Occupational History  . Occupation: retired  Tobacco Use  . Smoking status: Never Smoker  . Smokeless tobacco: Never Used  Vaping Use  . Vaping Use: Never used  Substance and Sexual Activity  . Alcohol use: No    Alcohol/week: 0.0 standard drinks  . Drug use: No  . Sexual activity: Not Currently    Birth control/protection: Surgical  Other Topics Concern  . Not on file  Social History Narrative  . Not on file   Social Determinants of Health   Financial Resource Strain: Medium Risk  . Difficulty of Paying Living Expenses: Somewhat hard  Food Insecurity: Food Insecurity Present  . Worried About Charity fundraiser in the Last Year: Sometimes true  . Ran Out of Food in the Last Year: Sometimes true  Transportation Needs: No Transportation Needs  . Lack of Transportation (Medical): No  . Lack of  Transportation (Non-Medical): No  Physical Activity:   . Days of Exercise per Week: Not on file  . Minutes of Exercise per Session: Not on file  Stress:   . Feeling of Stress : Not on file  Social Connections:   . Frequency of Communication with Friends and Family: Not on file  . Frequency of Social Gatherings with Friends and Family: Not on file  . Attends Religious Services: Not on file  . Active Member of Clubs or Organizations: Not on file  . Attends Archivist Meetings: Not on file  . Marital Status: Not on file  Intimate Partner Violence:   . Fear of Current or Ex-Partner: Not on file  . Emotionally Abused: Not on file  . Physically Abused: Not on file  . Sexually Abused: Not on file    Review of Systems: ROS negative except for what is noted on the assessment and plan.  Vitals:   10/12/20 1100 10/12/20 1124  BP: (!) 174/79 (!) 176/70  Pulse: 85 79  SpO2: 100%   Weight: 119 lb 12.8 oz (54.3 kg)      Physical Exam: Physical Exam Constitutional:      Appearance: Normal appearance.  HENT:     Head: Normocephalic and atraumatic.  Eyes:     Extraocular Movements: Extraocular movements intact.  Cardiovascular:     Rate and Rhythm: Normal rate.     Pulses: Normal pulses.     Heart sounds: Normal heart sounds.  Pulmonary:     Effort: Pulmonary effort is normal.     Breath sounds: Normal breath sounds.  Musculoskeletal:        General: Normal range of motion.     Cervical back: Normal range of motion.     Right lower leg: No edema.     Left lower leg: No edema.  Skin:    General: Skin is warm and dry.  Neurological:     Mental Status: She is alert and oriented to person, place, and time. Mental status is at baseline.  Psychiatric:        Mood and Affect: Mood normal.      Assessment & Plan:   See Encounters Tab for problem based charting.  Patient discussed with Dr. Bertell Maria, D.O. [facility], PGY-2 Pager:  [phone], Phone: [phone] Date 10/13/2020 Time 8:07 AM

## 2020-10-12 NOTE — Chronic Care Management (AMB) (Signed)
Chronic Care Management   Follow Up Note   10/12/2020 Name: Amanda [name] MRN: [id number] DOB: 1951-03-31  Referred by: Cato Mulligan, MD Reason for referral : Chronic Care Management (NIDDM,HLD, Depression)   Amanda [name] is a 69 y.o. year old female who is a primary care patient of Cato Mulligan, MD. The CCM team was consulted for assistance with chronic disease management and care coordination needs.    Review of patient status, including review of consultants reports, relevant laboratory and other test results, and collaboration with appropriate care team members and the patient's provider was performed as part of comprehensive patient evaluation and provision of chronic care management services.    SDOH (Social Determinants of Health) assessments performed: No See Care Plan activities for detailed interventions related to [facility])     Outpatient Encounter Medications as of 10/12/2020  Medication Sig Note  . ACCU-CHEK AVIVA PLUS test strip USE AS DIRECTED UP TO FOUR TIMES DAILY   . Accu-Chek Softclix Lancets lancets USE AS DIRECTED UP TO FOUR TIMES DAILY   . acetaminophen (TYLENOL) 325 MG tablet Take 2 tablets (650 mg total) by mouth every 6 (six) hours as needed (or Fever >/= 101).   [name] amLODipine (NORVASC) 10 MG tablet Take 1 tablet (10 mg total) by mouth daily.   [name] atorvastatin (LIPITOR) 40 MG tablet TAKE 1 TABLET (40 MG TOTAL) BY MOUTH DAILY AT 6 PM.   . blood glucose meter kit and supplies KIT Dispense based on patient and insurance preference. Use up to four times daily as directed. .   . cetirizine (ZYRTEC) 5 MG tablet Take 1 tablet (5 mg total) by mouth daily.   . chlorthalidone (HYGROTON) 25 MG tablet Take 1 tablet (25 mg total) by mouth daily.   . Cholecalciferol (VITAMIN D3) 2000 units capsule Take by mouth daily.    . diclofenac Sodium (VOLTAREN) 1 % GEL Apply topically. 04/08/2020: Uses prn  . Ferrous Sulfate (IRON) 325 (65 Fe) MG TABS Take 1 tablet (325 mg  total) by mouth daily.   . fluticasone (FLONASE) 50 MCG/ACT nasal spray Place 2 sprays into both nostrils daily.   [name] glipiZIDE (GLUCOTROL) 5 MG tablet Take 0.5 tablets (2.5 mg total) by mouth daily before breakfast.   . latanoprost (XALATAN) 0.005 % ophthalmic solution 1 drop at bedtime. 09/10/2019: LF 6.4.20 #2.5/30  . metFORMIN (GLUCOPHAGE-XR) 500 MG 24 hr tablet TAKE 2 TABLETS (1,000 MG TOTAL) BY MOUTH 2 (TWO) TIMES DAILY WITH A MEAL.   . Multiple Vitamins-Minerals (WOMENS 50+ MULTI VITAMIN/MIN) TABS Take 1 tablet by mouth daily.   . Semaglutide,0.25 or 0.5MG/DOS, (OZEMPIC, 0.25 OR 0.5 MG/DOSE,) 2 MG/1.5ML SOPN Inject 0.5 mg into the skin once a week. (Patient not taking: Reported on 09/28/2020) 09/28/2020: Cannot afford copay   No facility-administered encounter medications on file as of 10/12/2020.     Objective:  Wt Readings from Last 3 Encounters:  10/12/20 119 lb 12.8 oz (54.3 kg)  09/14/20 121 lb 1.6 oz (54.9 kg)  07/03/20 123 lb 9.6 oz (56.1 kg)   BP Readings from Last 3 Encounters:  10/12/20 (!) 176/70  10/07/20 137/77  09/14/20 (!) 199/97    Goals Addressed              This Visit's Progress     Patient Stated   .  " My blood sugar is running high and I didn't realize I was supposed to be checking my blood pressure everyday." (pt-stated)  CARE PLAN ENTRY (see longitudinal plan of care for additional care plan information)  Current Barriers:  . Chronic Disease Management support, education, and care coordination needs related to HTN, HLD, DMII, and CKD - met with patient during  clinic visit to introduce CCM RN in person and discuss elevated Hgb A1C, patient attributes her elevated blood sugars and Hgb A1C to stress as she is the caregiver for her husband, sister and Mom  . Clinical Goal(s) related to HTN, HLD, DMII, and CKD. Over the next 30- 60 days, patient will:  . Work with the care management team to address educational, disease management, and care  coordination needs  . Begin or continue self health monitoring activities as directed today Measure and record CBG (blood glucose) 1-2 times daily and Measure and record blood pressure 5-7 times per week . Call provider office for new or worsened signs and symptoms Blood glucose findings outside established parameters, Blood pressure findings outside established parameters, and New or worsened symptom related to HTN, HLD, DMII, and chronic diarrhea . Call care management team with questions or concerns . Verbalize basic understanding of patient centered plan of care established today  Interventions related to HTN, HLD, DMII, and CKD. [name] Appropriate assessments completed  . Inter-disciplinary care team collaboration (see longitudinal plan of care) . Evaluation of current treatment plan related to HTN and DM and patient's adherence to plan as established by provider. . Reviewed medication changes to DM treatment plan. Discussed mechanism of action or Glipizide and advised patient to eat within 30 minutes of taking medication and to eat at regular intervals to lower risk of hypoglycemia. . Reminded patient their are community resources that address caregiver stress and advised her to notify CCM BSW if she is interested . Provided patient with [place] [employer] spiral bound day planner with business cards of this CCM RN and the [place] . Suggested patient log her blood sugars and blood pressures in the  [place] [facility]  day planner in the appropriate sections in the back . Discussed plans with patient for ongoing care management follow up and provided patient with direct contact information for care management team  Patient Self Care Activities related to HTN, HLD, DMII, and CKD. [name] Patient is unable to independently self-manage chronic health conditions  Please see past updates related to this goal by clicking on the "Past Updates" button in  the selected goal          Plan:   The care management team will reach out to the patient again over the next 30-60 days.    [name] RN, CCM, [facility] RN Care Manager [phone]

## 2020-10-12 NOTE — Patient Instructions (Signed)
Thank you, Ms.Addalyne R Josten for allowing [place] to provide your care today. Today we discussed blood pressure, diabetes.    I have ordered the following labs for you:   Lab Orders     Glucose, capillary     Lipid Profile     CBC no Diff     Ferritin     BMP8+Anion Gap     Microalbumin / Creatinine Urine Ratio     POC Hbg A1C   Tests ordered today:  none  Referrals ordered today:   Referral Orders  No referral(s) requested today     I have ordered the following medication/changed the following medications:   Stop the following medications: There are no discontinued medications.   Start the following medications: Meds ordered this encounter  Medications  . glipiZIDE (GLUCOTROL) 5 MG tablet    Sig: Take 0.5 tablets (2.5 mg total) by mouth daily before breakfast.    Dispense:  15 tablet    Refill:  0     Follow up: 2 weeks    Remember:   Should you have any questions or concerns please call the internal medicine clinic at [phone].     Marianna Payment, D.O. [place]

## 2020-10-12 NOTE — Progress Notes (Signed)
Internal Medicine Clinic Resident  I have personally reviewed this encounter including the documentation in this note and/or discussed this patient with the care management provider. I will address any urgent items identified by the care management provider and will communicate my actions to the patient's PCP. I have reviewed the patient's CCM visit with my supervising attending, Dr Mullen.  Manveer Gomes, MD  [facility] PGY-2 10/12/2020    

## 2020-10-13 ENCOUNTER — Telehealth: Payer: Self-pay | Admitting: Internal Medicine

## 2020-10-13 ENCOUNTER — Encounter: Payer: Self-pay | Admitting: Internal Medicine

## 2020-10-13 LAB — LIPID PANEL
Chol/HDL Ratio: 2 ratio (ref 0.0–4.4)
Cholesterol, Total: 144 mg/dL (ref 100–199)
HDL: 73 mg/dL (ref >39)
LDL Chol Calc (NIH): 59 mg/dL (ref 0–99)
Triglycerides: 56 mg/dL (ref 0–149)
VLDL Cholesterol Cal: 12 mg/dL (ref 5–40)

## 2020-10-13 LAB — MICROALBUMIN / CREATININE URINE RATIO
Creatinine, Urine: 30.5 mg/dL
Microalb/Creat Ratio: 1861 mg/g creat — ABNORMAL HIGH (ref 0–29)
Microalbumin, Urine: 567.7 ug/mL

## 2020-10-13 LAB — CBC
Hematocrit: 33.1 % — ABNORMAL LOW (ref 34.0–46.6)
Hemoglobin: 10.8 g/dL — ABNORMAL LOW (ref 11.1–15.9)
MCH: 30.7 pg (ref 26.6–33.0)
MCHC: 32.6 g/dL (ref 31.5–35.7)
MCV: 94 fL (ref 79–97)
Platelets: 287 10*3/uL (ref 150–450)
RBC: 3.52 x10E6/uL — ABNORMAL LOW (ref 3.77–5.28)
RDW: 11.8 % (ref 11.7–15.4)
WBC: 5.4 10*3/uL (ref 3.4–10.8)

## 2020-10-13 LAB — BMP8+ANION GAP
Anion Gap: 14 mmol/L (ref 10.0–18.0)
BUN/Creatinine Ratio: 21 (ref 12–28)
BUN: 30 mg/dL — ABNORMAL HIGH (ref 8–27)
CO2: 24 mmol/L (ref 20–29)
Calcium: 9.4 mg/dL (ref 8.7–10.3)
Chloride: 99 mmol/L (ref 96–106)
Creatinine, Ser: 1.44 mg/dL — ABNORMAL HIGH (ref 0.57–1.00)
GFR calc Af Amer: 43 mL/min/{1.73_m2} — ABNORMAL LOW (ref >59)
GFR calc non Af Amer: 37 mL/min/{1.73_m2} — ABNORMAL LOW (ref >59)
Glucose: 386 mg/dL — ABNORMAL HIGH (ref 65–99)
Potassium: 4.5 mmol/L (ref 3.5–5.2)
Sodium: 137 mmol/L (ref 134–144)

## 2020-10-13 LAB — FERRITIN: Ferritin: 70 ng/mL (ref 15–150)

## 2020-10-13 NOTE — Assessment & Plan Note (Addendum)
Uncontrolled hypertension with blood pressure of 176/70.  Patient states that she has not been adherent to her medication management amlodipine 10 mg and chlorthalidone 25 mg.  I counseled her on restarting this medication today.  I will check her BMP to assess kidney function and electrolytes.  Plan: - Restart amlodipine 10 mg and chlorthalidone 25 mg -BMP ordered today   Addendum:   Patient has significant microalbuminuria.  She would likely benefit from starting ACE/ARB for proteinuria.  She will need to be reevaluated in 2 weeks.

## 2020-10-13 NOTE — Telephone Encounter (Signed)
Call patient with lab results.  No answer.  Left hip compliant voicemail.  Patient will need to follow-up with our clinic in 1 week to reassess her blood pressure and her diabetes.  She will need to bring her glucometer so we can evaluate her blood sugars.   Marianna Payment, D.O. [facility], PGY-2 Pager: [phone], Phone: [phone] Date 10/13/2020 Time 2:19 PM

## 2020-10-13 NOTE — Assessment & Plan Note (Signed)
Is taking atorvastatin 40 mg states that she has been adherent to this medication and tolerating it well.   Plan: -Continue atorvastatin 40 mg daily.

## 2020-10-13 NOTE — Assessment & Plan Note (Addendum)
Patient has a history of diabetes that was previously controlled with an A1c of 7.9.  Today her hemoglobin A1c is 10.5.  Patient was previously prescribed semaglutide 0.5 mg weekly and Metformin 1000 mg twice daily.  Patient states that she has not been able to afford the semaglutide.  Chronic care management has been consulted regarding this medication.  Currently, we are waiting on the patient to fill out her paperwork for financial assistance with this medication.  Patient states she is tolerating Metformin well without any side effects.  She denies any signs of hypo or hyperglycemia.  She admits to having foot exam this year when I examined workforce this year.  She is checking her blood sugars at home and states is ranging between the 200s to mid 300s.  She checks her blood sugar 2-3 times daily.   I counseled her on the importance of getting her blood sugar under control and the signs and symptoms of hypo and hyperglycemia.  I will start the patient on low-dose glipizide today while we work on getting her semaglutide covered. Patient will likely need to be started on insulin in the near future for better blood glucose control.    Plan: -Start glipizide 5 mg daily we will titrate up at subsequent visit. -Continue Metformin 1000 mg twice daily.   Addendum:  Patient's BMP shows significant hyperglycemia.  Patient will need to be reevaluated in a week to assess her blood glucose levels and possibly initiate insulin therapy.

## 2020-10-15 ENCOUNTER — Other Ambulatory Visit: Payer: Self-pay | Admitting: Internal Medicine

## 2020-10-15 ENCOUNTER — Telehealth: Payer: Self-pay

## 2020-10-15 DIAGNOSIS — E118 Type 2 diabetes mellitus with unspecified complications: Secondary | ICD-10-CM

## 2020-10-15 DIAGNOSIS — Z794 Long term (current) use of insulin: Secondary | ICD-10-CM

## 2020-10-15 MED ORDER — INSULIN GLARGINE 100 UNITS/ML SOLOSTAR PEN: 10.0000 [IU] | PEN_INJECTOR | Freq: Every day | SUBCUTANEOUS | 11 refills | Status: DC

## 2020-10-15 MED ORDER — INSULIN PEN NEEDLE 32G X 4 MM MISC: 30.0000 | Freq: Every day | 3 refills | Status: DC

## 2020-10-15 MED ORDER — INSULIN GLARGINE 100 UNIT/ML SOLOSTAR PEN: 10.0000 [IU] | PEN_INJECTOR | Freq: Every day | SUBCUTANEOUS | 2 refills | Status: DC

## 2020-10-15 MED FILL — LANTUS SOLOSTAR 100 UNITS/M: 100 | 30 days supply | Qty: 3 | Fill #0

## 2020-10-15 NOTE — Addendum Note (Signed)
Addended by: Lawerance Cruel on: 10/15/2020 04:18 PM   Modules accepted: Orders

## 2020-10-15 NOTE — Telephone Encounter (Signed)
Requesting lab results, please call pt back.  

## 2020-10-15 NOTE — Progress Notes (Signed)
Spoke to patient regarding her most recent lab results.  I addressed my concern for elevated blood sugars.  Patient states that she has picked up the glipizide and that her blood sugars have improved on it but she still remains in the high 200s to low 300s.  I discussed starting insulin therapy.  I walked her through taking her blood sugar 4 times a day including a fasting CBG first thing in the morning.  Patient admits understanding.  I will start her on 10 units Lantus nightly and reevaluate her blood sugars on November 4.  She will likely need additional insulin therapy.  I counseled her on the signs and symptoms of hyper and hypoglycemia to look out for.  She states that she is not having any symptoms at this time.   Plan: -Continue Metformin and glipizide -Lantus 10 units nightly -Follow-up on November 4.   Marianna Payment, D.O. [facility], PGY-2 Pager: [phone], Phone: [phone] Date 10/15/2020 Time 2:49 PM

## 2020-10-19 NOTE — Progress Notes (Signed)
Internal Medicine Clinic Attending  Case discussed with Dr. Coe  At the time of the visit.  We reviewed the resident's history and exam and pertinent patient test results.  I agree with the assessment, diagnosis, and plan of care documented in the resident's note.  

## 2020-10-21 ENCOUNTER — Encounter: Payer: Medicare Other | Admitting: Internal Medicine

## 2020-10-28 ENCOUNTER — Ambulatory Visit (INDEPENDENT_AMBULATORY_CARE_PROVIDER_SITE_OTHER): Payer: Medicare Other | Admitting: Internal Medicine

## 2020-10-28 ENCOUNTER — Encounter: Payer: Self-pay | Admitting: Internal Medicine

## 2020-10-28 ENCOUNTER — Other Ambulatory Visit: Payer: Self-pay | Admitting: Internal Medicine

## 2020-10-28 ENCOUNTER — Other Ambulatory Visit: Payer: Self-pay

## 2020-10-28 VITALS — BP 147/78 | HR 79 | Temp 98.3°F | Ht 61.0 in | Wt 129.3 lb

## 2020-10-28 DIAGNOSIS — E118 Type 2 diabetes mellitus with unspecified complications: Secondary | ICD-10-CM

## 2020-10-28 DIAGNOSIS — I10 Essential (primary) hypertension: Secondary | ICD-10-CM

## 2020-10-28 DIAGNOSIS — Z794 Long term (current) use of insulin: Secondary | ICD-10-CM | POA: Diagnosis not present

## 2020-10-28 DIAGNOSIS — Z23 Encounter for immunization: Secondary | ICD-10-CM

## 2020-10-28 LAB — GLUCOSE, CAPILLARY: Glucose-Capillary: 85 mg/dL (ref 70–99)

## 2020-10-28 MED ORDER — LISINOPRIL 5 MG PO TABS: 5.0000 mg | ORAL_TABLET | Freq: Every day | ORAL | 5 refills | Status: DC

## 2020-10-28 MED FILL — AMLODIPINE BESYLATE 10 MG T: 10 | 30 days supply | Qty: 30 | Fill #1

## 2020-10-28 MED FILL — LISINOPRIL 5 MG TABS: 5 | 30 days supply | Qty: 30 | Fill #0

## 2020-10-28 NOTE — Patient Instructions (Signed)
[name],  It was a pleasure to see you today. Thank you for coming in.   Today we discussed your blood pressure. This was a little high today. I have started you on a medication called Lisinopril, please continue the amlodipine and chlorthalidone. I have provided you some information on the DASH diet. We will check some labs on your follow up visit.    We also discussed your diabetes, please stop taking the Glipizide for now. Please continue taking Lantus and metformin. Please bring in your meter on your next visit.   Please return to clinic in 2 weeks or sooner if needed.   Thank you again for coming in.   Asencion Noble.D.

## 2020-10-28 NOTE — Assessment & Plan Note (Signed)
Patient is on Lantus 10 units daily, Metformin 1000 mg, and Glipizide 5 mg daily. She reports that it took about 3-4 days to bring down her blood sugars, now persistently around 70-80s. She does not have her meter with her but has reading from the past 6 days. One time she has a low blood sugar ar 52, she reports that she would start itching on her stomach. She thinks she ate a regular meal the night before. No light headedness, dizziness, sweating, headache, or other issues.  Denies any polyuria or polydipsia, wakes up 1-2 per night to urinate.  We discussed risks of hypoglycemia and hyperglycemia.  Discussed that her blood sugars seem to be improving with the Lantus, that the risk of glipizide causing hypoglycemia may outweigh the benefits.  -Continue Lantus 10 units daily -Continue Metformin 1000 mg twice daily -Discontinue Glipizide -Continue monitoring blood sugars -RTC in 2 weeks

## 2020-10-28 NOTE — Progress Notes (Signed)
   CC: DM and HTN  HPI:  [name] is a 69 y.o. with the history listed below presenting for follow up of her diabetes and hypertension.   Past Medical History:  Diagnosis Date  . Allergy    SEASONAL  . Anemia   . Arthritis   . Cataract    PRESENT IN LEFT/ CATARACTS REMOVED FROM RIGHT  . Diabetes mellitus 1996  . DKA (diabetic ketoacidoses) 01/02/2013  . Hyperlipidemia   . Hypertension   . Hypotension   . Influenza A (H1N1) 01/03/2013  . Neuromuscular disorder ([place])    NEUROPATHY  . SYNCOPE 05/10/2010   Qualifier: Diagnosis of  By: Marilynne Halsted RN, BSN, Jacquelyn     Review of Systems:   Constitutional: Negative for chills and fever.  Respiratory: Negative for shortness of breath.   Cardiovascular: Negative for chest pain and leg swelling.  Gastrointestinal: Negative for abdominal pain, nausea and vomiting.  Neurological: Negative for dizziness and headaches.   Physical Exam:  Vitals:   10/28/20 1015  BP: (!) 150/78  Pulse: 81  Temp: 98.3 F (36.8 C)  TempSrc: Oral  SpO2: 100%  Weight: 129 lb 4.8 oz (58.7 kg)  Height: 5\' 1"  (1.549 m)   Physical Exam Constitutional:      Appearance: Normal appearance.  HENT:     Mouth/Throat:     Mouth: Mucous membranes are moist.     Pharynx: Oropharynx is clear.  Eyes:     Pupils: Pupils are equal, round, and reactive to light.  Cardiovascular:     Rate and Rhythm: Normal rate and regular rhythm.     Pulses: Normal pulses.     Heart sounds: Normal heart sounds.  Pulmonary:     Effort: Pulmonary effort is normal.     Breath sounds: Normal breath sounds.  Abdominal:     General: Abdomen is flat. Bowel sounds are normal.     Palpations: Abdomen is soft.  Skin:    General: Skin is warm and dry.     Capillary Refill: Capillary refill takes less than 2 seconds.  Neurological:     General: No focal deficit present.     Mental Status: She is alert and oriented to person, place, and time.  Psychiatric:        Mood and  Affect: Mood normal.        Behavior: Behavior normal.     Assessment & Plan:   See Encounters Tab for problem based charting.  Patient discussed with Dr. Rebeca Alert

## 2020-10-28 NOTE — Assessment & Plan Note (Signed)
She reports that she is taking the amlodipine 10 mg daily and chlorthalidone 25 mg daily. BP today is elevated at 150/78, repeat was 147/78. She denies any issues taking her medications, denies lightheadedness, dizziness, headache, chest pain, shortness of breath.  Does report eating a lot of processed foods which she is trying to cut down on.  Previous urine microalbumin/creatinine ratio was elevated.  Discussed addition of lisinopril for better blood pressure control and renal protection.  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  Last BMP on 10/25 showed potassium of 4.5.  We will add low-dose lisinopril and repeat BMP on follow-up visit.  -Continue amlodipine 10 mg daily -Continue chlorthalidone 25 mg daily -Add lisinopril 5 mg daily -RTC in 2 weeks, repeat BMP at that time, lisinopril may need to be discontinued if she develops hyperkalemia

## 2020-10-29 NOTE — Progress Notes (Signed)
Internal Medicine Clinic Attending  Case discussed with Dr. Krienke at the time of the visit.  We reviewed the resident's history and exam and pertinent patient test results.  I agree with the assessment, diagnosis, and plan of care documented in the resident's note.  Alexander Raines, M.D., Ph.D.  

## 2020-11-11 ENCOUNTER — Encounter: Payer: Medicare Other | Admitting: Internal Medicine

## 2020-11-16 MED FILL — LANTUS SOLOSTAR 100 UNITS/M: 100 | 30 days supply | Qty: 3 | Fill #1

## 2020-11-18 ENCOUNTER — Encounter: Payer: Medicare Other | Admitting: Student

## 2020-11-19 ENCOUNTER — Other Ambulatory Visit: Payer: Self-pay | Admitting: *Deleted

## 2020-11-19 ENCOUNTER — Other Ambulatory Visit: Payer: Self-pay | Admitting: Internal Medicine

## 2020-11-19 DIAGNOSIS — E785 Hyperlipidemia, unspecified: Secondary | ICD-10-CM

## 2020-11-19 MED ORDER — ATORVASTATIN CALCIUM 40 MG PO TABS: 40.0000 mg | ORAL_TABLET | Freq: Every day | ORAL | 1 refills | Status: DC

## 2020-11-20 MED FILL — ATORVASTATIN 40 MG TABLET: 40 | 90 days supply | Qty: 90 | Fill #0

## 2020-11-23 ENCOUNTER — Ambulatory Visit (INDEPENDENT_AMBULATORY_CARE_PROVIDER_SITE_OTHER): Payer: Medicare Other | Admitting: Student

## 2020-11-23 ENCOUNTER — Encounter: Payer: Self-pay | Admitting: Student

## 2020-11-23 VITALS — BP 147/75 | HR 86 | Temp 98.4°F

## 2020-11-23 DIAGNOSIS — Z794 Long term (current) use of insulin: Secondary | ICD-10-CM | POA: Diagnosis not present

## 2020-11-23 DIAGNOSIS — I10 Essential (primary) hypertension: Secondary | ICD-10-CM | POA: Diagnosis not present

## 2020-11-23 DIAGNOSIS — E113513 Type 2 diabetes mellitus with proliferative diabetic retinopathy with macular edema, bilateral: Secondary | ICD-10-CM | POA: Diagnosis not present

## 2020-11-23 DIAGNOSIS — E118 Type 2 diabetes mellitus with unspecified complications: Secondary | ICD-10-CM

## 2020-11-23 MED ORDER — INVOKAMET 50-1000 MG PO TABS: 1.0000 | ORAL_TABLET | Freq: Two times a day (BID) | ORAL | 0 refills | Status: DC

## 2020-11-23 NOTE — Patient Instructions (Addendum)
Ms. Ciaramitaro,  It was a pleasure meeting you today.  For your high blood pressure: We will continue on the current regimen of amlodipine 10mg  daily, chlorthalidone 25mg  daily, and lisinopril 5mg  daily. We will check your kidney function and electrolytes today. Depending on the results of those labs, we may adjust your lisinopril dose. I will call you once we have the results. Also, I have ordered an echocardiogram which is an ultrasound to assess how your heart is pumping. You will be called about scheduling an appointment in the next week or two.  For your diabetes: Due to your recurrent low blood sugars in the morning, we want you to stop taking the lantus (insulin) in the morning. Instead, we will transition you to a medication called Invokamet. This is a combination pill of invokana (canagliflozin) and metformin (which you already are taking). You will take this medication twice daily, every day.  We would like for you to follow-up in one month to review these changes.  Sincerely, Dr. Paulla Dolly, MD

## 2020-11-23 NOTE — Assessment & Plan Note (Addendum)
Patient reports adherence to current regimen of amlodipine 10mg  daily, chlorthalidone 25mg  daily and recently started as of one month ago lisinopril 5mg  daily. Blood pressure today is elevated to 147/75. Review of home blood pressure log reviews frequently elevated blood pressures to [id number] systolic. She was started on lisinopril for renal protection in the setting of her CKD and diabetes, however she has a prior history of hyperkalemia with this medication so she was started on a low dose. -Obtain BMP today -Continue lisinopril 5mg  daily for now, depending on results of BMP may increase lisinopril to 10mg  daily -If patient is to develop hyperkalemia, she can be started on Lokelma concurrently with continuation of lisinopril -Continue amlodipine 10mg  daily -Continue chlorthalidone 25mg  daily -Echocardiogram  ADDENDUM: Patient's BMP does not show new development of hyperkalemia, therefore she should be safe to go up on lisinopril from 5mg  to 10mg  daily.

## 2020-11-23 NOTE — Progress Notes (Signed)
   CC: HTN, T2DM  HPI:  Amanda [name] is a 69 y.o. female with past medical history significant for HTN and T2DM who presents to clinic for 62-month follow-up. Refer to problem list for charting of this encounter.  Past Medical History:  Diagnosis Date  . Allergy    SEASONAL  . Anemia   . Arthritis   . Cataract    PRESENT IN LEFT/ CATARACTS REMOVED FROM RIGHT  . Diabetes mellitus 1996  . DKA (diabetic ketoacidoses) 01/02/2013  . Hyperlipidemia   . Hypertension   . Hypotension   . Influenza A (H1N1) 01/03/2013  . Neuromuscular disorder ([place])    NEUROPATHY  . SYNCOPE 05/10/2010   Qualifier: Diagnosis of  By: Marilynne Halsted RN, BSN, Jacquelyn     Family History  Problem Relation Age of Onset  . Diabetes Mother   . Hypertension Mother   . Breast cancer Mother   . Aneurysm Father   . Diabetes Sister   . Hypertension Sister   . Kidney disease Sister        One Kidney transplant, one sister on HD  . Breast cancer Sister   . Colon cancer Neg Hx   . Esophageal cancer Neg Hx    Social History   Tobacco Use  . Smoking status: Never Smoker  . Smokeless tobacco: Never Used  Vaping Use  . Vaping Use: Never used  Substance Use Topics  . Alcohol use: No    Alcohol/week: 0.0 standard drinks  . Drug use: No   Review of Systems:  Endorses swelling in bilateral lower extremities. Denies chest pain, shortness of breath, lightheadedness, dizziness.   Physical Exam:  Vitals:   11/23/20 0954  BP: (!) 147/75  Pulse: 86  Temp: 98.4 F (36.9 C)  TempSrc: Oral  SpO2: 100%   Physical Exam Cardiovascular:     Rate and Rhythm: Normal rate and regular rhythm.     Pulses: Normal pulses.     Heart sounds: Normal heart sounds.  Pulmonary:     Effort: Pulmonary effort is normal.     Breath sounds: Normal breath sounds.  Abdominal:     General: Abdomen is flat. Bowel sounds are normal.     Palpations: Abdomen is soft.     Tenderness: There is no abdominal tenderness.   Musculoskeletal:     Right lower leg: Edema present.     Left lower leg: Edema present.     Comments: Trace edema of bilateral ankles     Assessment & Plan:   See Encounters Tab for problem based charting.  Patient seen with Dr. Dareen Piano

## 2020-11-23 NOTE — Assessment & Plan Note (Addendum)
Patient's last A1c of 10.5 on October 25th. Her most recent antiglycemic regimen includes lantus 10u daily and metformin 1000mg  twice daily. She recently discontinued glipizide one month prior due to persistently low morning blood sugars in the 70s-80s. On review of her home blood glucose log, she frequently has a blood glucose level in the 50s, 60s and 70s upon awakening with no high recordings in the morning. She has very few non-morning recordings of her blood glucose levels, however the few that she does have are all in the high 100s, low 200s. Given these findings, patient is likely experiencing significant down trend in her blood glucose levels overnight representing that patient is receiving too much long-acting insulin.  -Decrease lantus from 10 to 5u daily -Continue metformin 1000mg  twice daily

## 2020-11-24 ENCOUNTER — Other Ambulatory Visit: Payer: Self-pay | Admitting: Student

## 2020-11-24 ENCOUNTER — Telehealth: Payer: Self-pay | Admitting: *Deleted

## 2020-11-24 LAB — BMP8+ANION GAP
Anion Gap: 12 mmol/L (ref 10.0–18.0)
BUN/Creatinine Ratio: 21 (ref 12–28)
BUN: 32 mg/dL — ABNORMAL HIGH (ref 8–27)
CO2: 22 mmol/L (ref 20–29)
Calcium: 9.3 mg/dL (ref 8.7–10.3)
Chloride: 107 mmol/L — ABNORMAL HIGH (ref 96–106)
Creatinine, Ser: 1.49 mg/dL — ABNORMAL HIGH (ref 0.57–1.00)
GFR calc Af Amer: 41 mL/min/{1.73_m2} — ABNORMAL LOW (ref >59)
GFR calc non Af Amer: 36 mL/min/{1.73_m2} — ABNORMAL LOW (ref >59)
Glucose: 110 mg/dL — ABNORMAL HIGH (ref 65–99)
Potassium: 4.8 mmol/L (ref 3.5–5.2)
Sodium: 141 mmol/L (ref 134–144)

## 2020-11-24 MED ORDER — SYNJARDY 5-1000 MG PO TABS: 1.0000 | ORAL_TABLET | Freq: Two times a day (BID) | ORAL | 0 refills | Status: DC

## 2020-11-24 MED ORDER — LISINOPRIL 10 MG PO TABS: 10.0000 mg | ORAL_TABLET | Freq: Every day | ORAL | 0 refills | Status: DC

## 2020-11-24 MED FILL — LISINOPRIL 10 MG TABS: 10 | 30 days supply | Qty: 30 | Fill #0

## 2020-11-24 NOTE — Telephone Encounter (Signed)
Switched patient to [place] from [place] [employer]. Thanks!

## 2020-11-24 NOTE — Addendum Note (Signed)
Addended by: Paulla Dolly on: 11/24/2020 03:48 PM   Modules accepted: Orders

## 2020-11-24 NOTE — Telephone Encounter (Signed)
Information was sent through CoverMyMeds for [place] for Invokamet.  Patient will need to try and fail the preferred alternatives of Autumn Messing XR, Xigduo, or combination use of Jardiance or Faxiga with Metformin as separate products.  Message to be sent to the Red Team to consider a change if appropriate.  Sander Nephew, RN 11/24/2020 12:17 PM.

## 2020-11-24 NOTE — Addendum Note (Signed)
Addended by: Paulla Dolly on: 11/24/2020 03:15 PM   Modules accepted: Orders

## 2020-11-25 ENCOUNTER — Other Ambulatory Visit: Payer: Self-pay | Admitting: Student

## 2020-11-25 ENCOUNTER — Encounter: Payer: Self-pay | Admitting: *Deleted

## 2020-11-25 DIAGNOSIS — E113513 Type 2 diabetes mellitus with proliferative diabetic retinopathy with macular edema, bilateral: Secondary | ICD-10-CM

## 2020-11-25 MED ORDER — INSULIN PEN NEEDLE 32G X 4 MM MISC: 30.0000 | Freq: Every day | 3 refills | Status: DC

## 2020-11-25 MED ORDER — INSULIN GLARGINE 100 UNIT/ML SOLOSTAR PEN: 5.0000 [IU] | PEN_INJECTOR | Freq: Every day | SUBCUTANEOUS | 0 refills | Status: DC

## 2020-11-25 MED ORDER — METFORMIN HCL 1000 MG PO TABS: 1000.0000 mg | ORAL_TABLET | Freq: Two times a day (BID) | ORAL | 2 refills | Status: DC

## 2020-11-25 MED FILL — UNIFINE PENTIPS 32GX5/32: 32G X 4 MM | 90 days supply | Qty: 100 | Fill #0

## 2020-11-25 MED FILL — METFORMIN HCL ER 500 MG TB2: 500 | 90 days supply | Qty: 360 | Fill #1

## 2020-11-25 NOTE — Progress Notes (Signed)
Internal Medicine Clinic Attending  I saw and evaluated the patient.  I personally confirmed the key portions of the history and exam documented by Dr. Johnson and I reviewed pertinent patient test results.  The assessment, diagnosis, and plan were formulated together and I agree with the documentation in the resident's note.  

## 2020-11-25 NOTE — Progress Notes (Unsigned)

## 2020-11-25 NOTE — Addendum Note (Signed)
Addended by: Paulla Dolly on: 11/25/2020 04:26 PM   Modules accepted: Orders

## 2020-11-26 ENCOUNTER — Encounter: Payer: Self-pay | Admitting: Student

## 2020-11-26 ENCOUNTER — Telehealth: Payer: Self-pay

## 2020-11-26 NOTE — Progress Notes (Signed)
Things That May Be Affecting Your Health:  Alcohol  Hearing loss  Pain    Depression  Home Safety  Sexual Health  X Diabetes  Lack of physical activity  Stress   Difficulty with daily activities  Loneliness  Tiredness   Drug use X Medicines  Tobacco use   Falls  Motor Vehicle Safety  Weight  X Food choices  Oral Health  Other    YOUR PERSONALIZED HEALTH PLAN : 1. Schedule your next subsequent Medicare Wellness visit in one year 2. Attend all of your regular appointments to address your medical issues 3. Complete the preventative screenings and services   Annual Wellness Visit   Medicare Covered Preventative Screenings and [place] Men and Women Who How Often Need? Date of Last Service Action  Abdominal Aortic Aneurysm Adults with AAA risk factors Once     Alcohol Misuse and Counseling All Adults Screening once a year if no alcohol misuse. Counseling up to 4 face to face sessions.     Bone Density Measurement  Adults at risk for osteoporosis Once every 2 yrs     Lipid Panel Z13.6 All adults without CV disease Once every 5 yrs     Colorectal Cancer   Stool sample or  Colonoscopy All adults 56 and older   Once every year  Every 10 years     Depression All Adults Once a year  Today   Diabetes Screening Blood glucose, post glucose load, or GTT Z13.1  All adults at risk  Pre-diabetics  Once per year  Twice per year     Diabetes  Self-Management Training All adults Diabetics 10 hrs first year; 2 hours subsequent years. Requires Copay     Glaucoma  Diabetics  Family history of glaucoma  African Americans 29 yrs +  Hispanic Americans 46 yrs + Annually - requires coppay     Hepatitis C Z72.89 or F19.20  High Risk for HCV  Born between 1945 and 1965  Annually  Once     HIV Z11.4 All adults based on risk  Annually btw ages 52 & 91 regardless of risk  Annually > 65 yrs if at increased risk     Lung Cancer Screening Asymptomatic adults aged  104-77 with 30 pack yr history and current smoker OR quit within the last 15 yrs Annually Must have counseling and shared decision making documentation before first screen     Medical Nutrition Therapy Adults with   Diabetes  Renal disease  Kidney transplant within past 3 yrs 3 hours first year; 2 hours subsequent years     Obesity and Counseling All adults Screening once a year Counseling if BMI 30 or higher  Today   Tobacco Use Counseling Adults who use tobacco  Up to 8 visits in one year     Vaccines Z23  Hepatitis B  Influenza   Pneumonia  Adults   Once  Once every flu season  Two different vaccines separated by one year     Next Annual Wellness Visit People with Medicare Every year  Today     Services & Screenings Women Who How Often Need  Date of Last Service Action  Mammogram  Z12.31 Women over 31 One baseline ages 86-39. Annually ager 40 yrs+     Pap tests All women Annually if high risk. Every 2 yrs for normal risk women     Screening for cervical cancer with   Pap (Z01.419 nl or Z01.411abnl) &  HPV Z11.51 Women aged 11 to 88 Once every 5 yrs     Screening pelvic and breast exams All women Annually if high risk. Every 2 yrs for normal risk women     Sexually Transmitted Diseases  Chlamydia  Gonorrhea  Syphilis All at risk adults Annually for non pregnant females at increased risk         [place] Men Who How Ofter Need  Date of Last Service Action  Prostate Cancer - DRE & PSA Men over 50 Annually.  DRE might require a copay.     Sexually Transmitted Diseases  Syphilis All at risk adults Annually for men at increased risk

## 2020-11-26 NOTE — Telephone Encounter (Signed)
I spoke with patient this morning about her lab results and went over medication changes. I attempted to call her again to clarify any confusion, however she has not picked up the phone x3. I will attempt again shortly.

## 2020-11-26 NOTE — Telephone Encounter (Signed)
Requesting lab results, please call pt back.  

## 2020-11-30 ENCOUNTER — Ambulatory Visit: Payer: Medicare Other | Admitting: *Deleted

## 2020-11-30 DIAGNOSIS — E113513 Type 2 diabetes mellitus with proliferative diabetic retinopathy with macular edema, bilateral: Secondary | ICD-10-CM

## 2020-11-30 DIAGNOSIS — E782 Mixed hyperlipidemia: Secondary | ICD-10-CM

## 2020-11-30 DIAGNOSIS — I10 Essential (primary) hypertension: Secondary | ICD-10-CM

## 2020-11-30 NOTE — Patient Instructions (Signed)
Visit Information It was nice speaking with you today. Goals Addressed              This Visit's Progress     Patient Stated   .  " My blood sugar is running high and I didn't realize I was supposed to be checking my blood pressure everyday." (pt-stated)        CARE PLAN ENTRY (see longitudinal plan of care for additional care plan information)  Current Barriers:  . Chronic Disease Management support, education, and care coordination needs related to HTN, HLD, DMII, and CKD - spoke with patient via phone to complete follow up assessment, patient reports fasting and premeal blood sugar variance of 111-141, BP variance 112-129/67-80, denies symptoms of hypoglycemia, says she is not on an SGLT2 I because her insurance wouldn't approve the combination pill with Metformin and Jardiance  . Clinical Goal(s) related to HTN, HLD, DMII, and CKD. Over the next 30- 60 days, patient will:  . Work with the care management team to address educational, disease management, and care coordination needs  . Begin or continue self health monitoring activities as directed today Measure and record CBG (blood glucose) 1-2 times daily and Measure and record blood pressure 5-7 times per week . Call provider office for new or worsened signs and symptoms Blood glucose findings outside established parameters, Blood pressure findings outside established parameters, and New or worsened symptom related to HTN, HLD, DMII, and chronic diarrhea . Call care management team with questions or concerns . Verbalize basic understanding of patient centered plan of care established today  Interventions related to HTN, HLD, DMII, and CKD. [name] Appropriate assessments completed  . Inter-disciplinary care team collaboration (see longitudinal plan of care) . Evaluation of current treatment plan related to HTN, HLD and DM and patient's adherence to plan as established by provider. . Reviewed clinic visit note of 12/6 and labs  results . Reviewed medication changes to DM treatment plan. Discussed mechanism of action of Lantus and that fasting blood sugar values assess efficacy of Lantus. Discussed recommendations for SGLT2 inhibitor. . Messaged Dr Claiborne Billings ,clinic pharmacist, about patient education for SGLT2 inhibitors.  . Placed patient information on SGLT2 inhibitors in spiral bound calendar for patient to pick up on 12/24/20 during her clinic visit.  . Will message front office staff about scheduling EKG that was ordered on 11/23/20. . Will leave 2022 spiral bound [place] [facility] at front desk for patient to pick up during her clinic visit on 12/24/20. [name] Discussed plans with patient for ongoing care management follow up and provided patient with direct contact information for care management team  Patient Self Care Activities related to HTN, HLD, DMII, and CKD. [name] Patient is unable to independently self-manage chronic health conditions  Please see past updates related to this goal by clicking on the "Past Updates" button in the selected goal         The patient verbalized understanding of instructions, educational materials, and care plan provided today and declined offer to receive copy of patient instructions, educational materials, and care plan.   The care management team will reach out to the patient again over the next 30-60 days.   [name] RN, CCM, [facility] RN Care Manager [phone]

## 2020-11-30 NOTE — Chronic Care Management (AMB) (Signed)
Chronic Care Management   Follow Up Note   11/30/2020 Name: Amanda [name] MRN: [id number] DOB: 1951-11-22  Referred by: Cato Mulligan, MD Reason for referral : Chronic Care Management ( IDDM,HLD, HTN, Depression)   Amanda [name] is a 69 y.o. year old female who is a primary care patient of Cato Mulligan, MD. The CCM team was consulted for assistance with chronic disease management and care coordination needs.    Review of patient status, including review of consultants reports, relevant laboratory and other test results, and collaboration with appropriate care team members and the patient's provider was performed as part of comprehensive patient evaluation and provision of chronic care management services.    SDOH (Social Determinants of Health) assessments performed: No See Care Plan activities for detailed interventions related to [facility])     Outpatient Encounter Medications as of 11/30/2020  Medication Sig Note  . ACCU-CHEK AVIVA PLUS test strip USE AS DIRECTED UP TO FOUR TIMES DAILY   . Accu-Chek Softclix Lancets lancets USE AS DIRECTED UP TO FOUR TIMES DAILY   . acetaminophen (TYLENOL) 325 MG tablet Take 2 tablets (650 mg total) by mouth every 6 (six) hours as needed (or Fever >/= 101).   [name] amLODipine (NORVASC) 10 MG tablet Take 1 tablet (10 mg total) by mouth daily.   [name] atorvastatin (LIPITOR) 40 MG tablet Take 1 tablet (40 mg total) by mouth daily at 6 PM.   . chlorthalidone (HYGROTON) 25 MG tablet Take 1 tablet (25 mg total) by mouth daily.   . Cholecalciferol (VITAMIN D3) 2000 units capsule Take by mouth daily.    . diclofenac Sodium (VOLTAREN) 1 % GEL Apply topically. 04/08/2020: Uses prn  . Ferrous Sulfate (IRON) 325 (65 Fe) MG TABS Take 1 tablet (325 mg total) by mouth daily.   . fluticasone (FLONASE) 50 MCG/ACT nasal spray Place 2 sprays into both nostrils daily.   . insulin glargine (LANTUS) 100 UNIT/ML Solostar Pen Inject 5 Units into the skin at bedtime.   .  Insulin Pen Needle 32G X 4 MM MISC 30 each by Does not apply route daily.   [name] latanoprost (XALATAN) 0.005 % ophthalmic solution 1 drop at bedtime. 09/10/2019: LF 6.4.20 #2.5/30  . lisinopril (ZESTRIL) 10 MG tablet Take 1 tablet (10 mg total) by mouth daily.   Derrill Memo ON 12/18/2020] metFORMIN (GLUCOPHAGE) 1000 MG tablet Take 1 tablet (1,000 mg total) by mouth 2 (two) times daily with a meal.   . Multiple Vitamins-Minerals (WOMENS 50+ MULTI VITAMIN/MIN) TABS Take 1 tablet by mouth daily.   . blood glucose meter kit and supplies KIT Dispense based on patient and insurance preference. Use up to four times daily as directed. .   . cetirizine (ZYRTEC) 5 MG tablet Take 1 tablet (5 mg total) by mouth daily.    No facility-administered encounter medications on file as of 11/30/2020.     Objective:  Lab Results  Component Value Date   HGBA1C 10.5 (A) 10/12/2020   HGBA1C 7.9 (A) 07/03/2020   HGBA1C 6.3 (A) 03/23/2020   Lab Results  Component Value Date   MICROALBUR 18.4 (H) 06/16/2015   LDLCALC 59 10/12/2020   CREATININE 1.49 (H) 11/23/2020    Lab Results  Component Value Date   CHOL 144 10/12/2020   HDL 73 10/12/2020   LDLCALC 59 10/12/2020   TRIG 56 10/12/2020   CHOLHDL 2.0 10/12/2020   BP Readings from Last 3 Encounters:  11/23/20 (!) 147/75  10/28/20 (!) 147/78  10/12/20 [name])  176/70    Goals Addressed              This Visit's Progress     Patient Stated   .  " My blood sugar is running high and I didn't realize I was supposed to be checking my blood pressure everyday." (pt-stated)        CARE PLAN ENTRY (see longitudinal plan of care for additional care plan information)  Current Barriers:  . Chronic Disease Management support, education, and care coordination needs related to HTN, HLD, DMII, and CKD - spoke with patient via phone to complete follow up assessment, patient reports fasting and premeal blood sugar variance of 111-141, BP variance 112-129/67-80, denies  symptoms of hypoglycemia, says she is not on an SGLT2 I because her insurance wouldn't approve the combination pill with Metformin and Jardiance  . Clinical Goal(s) related to HTN, HLD, DMII, and CKD. Over the next 30- 60 days, patient will:  . Work with the care management team to address educational, disease management, and care coordination needs  . Begin or continue self health monitoring activities as directed today Measure and record CBG (blood glucose) 1-2 times daily and Measure and record blood pressure 5-7 times per week . Call provider office for new or worsened signs and symptoms Blood glucose findings outside established parameters, Blood pressure findings outside established parameters, and New or worsened symptom related to HTN, HLD, DMII, and chronic diarrhea . Call care management team with questions or concerns . Verbalize basic understanding of patient centered plan of care established today  Interventions related to HTN, HLD, DMII, and CKD. [name] Appropriate assessments completed  . Inter-disciplinary care team collaboration (see longitudinal plan of care) . Evaluation of current treatment plan related to HTN, HLD and DM and patient's adherence to plan as established by provider. . Reviewed clinic visit note of 12/6 and labs results . Reviewed medication changes to DM treatment plan. Discussed mechanism of action of Lantus and that fasting blood sugar values assess efficacy of Lantus. Discussed recommendations for SGLT2 inhibitor. . Messaged Dr Claiborne Billings ,clinic pharmacist, about patient education for SGLT2 inhibitors.  . Placed patient information on SGLT2 inhibitors in spiral bound calendar for patient to pick up on 12/24/20 during her clinic visit.  . Will message front office staff about scheduling EKG that was ordered on 11/23/20. . Will leave 2022 spiral bound [place] [facility] at front desk for patient  to pick up during her clinic visit on  12/24/20. [name] Discussed plans with patient for ongoing care management follow up and provided patient with direct contact information for care management team  Patient Self Care Activities related to HTN, HLD, DMII, and CKD. [name] Patient is unable to independently self-manage chronic health conditions  Please see past updates related to this goal by clicking on the "Past Updates" button in the selected goal           Plan:   The care management team will reach out to the patient again over the next 30-60 days.    [name] RN, CCM, [facility] RN Care Manager [phone]

## 2020-12-01 NOTE — Progress Notes (Signed)
Internal Medicine Clinic Resident  I have personally reviewed this encounter including the documentation in this note and/or discussed this patient with the care management provider. I will address any urgent items identified by the care management provider and will communicate my actions to the patient's PCP. I have reviewed the patient's CCM visit with my supervising attending, Dr Rebeca Alert.  Mitzi Hansen, MD Internal Medicine Resident PGY-2 [facility] Pager: [phone] 12/01/2020 4:15 PM

## 2020-12-02 ENCOUNTER — Encounter: Payer: Self-pay | Admitting: Podiatry

## 2020-12-02 ENCOUNTER — Other Ambulatory Visit: Payer: Self-pay

## 2020-12-02 ENCOUNTER — Ambulatory Visit: Payer: Medicare Other | Admitting: Podiatry

## 2020-12-02 DIAGNOSIS — M79675 Pain in left toe(s): Secondary | ICD-10-CM

## 2020-12-02 DIAGNOSIS — E1142 Type 2 diabetes mellitus with diabetic polyneuropathy: Secondary | ICD-10-CM

## 2020-12-02 DIAGNOSIS — L84 Corns and callosities: Secondary | ICD-10-CM | POA: Diagnosis not present

## 2020-12-02 DIAGNOSIS — B351 Tinea unguium: Secondary | ICD-10-CM

## 2020-12-02 DIAGNOSIS — M2041 Other hammer toe(s) (acquired), right foot: Secondary | ICD-10-CM

## 2020-12-02 DIAGNOSIS — M2012 Hallux valgus (acquired), left foot: Secondary | ICD-10-CM

## 2020-12-02 DIAGNOSIS — M2011 Hallux valgus (acquired), right foot: Secondary | ICD-10-CM

## 2020-12-02 DIAGNOSIS — M79674 Pain in right toe(s): Secondary | ICD-10-CM

## 2020-12-02 NOTE — Progress Notes (Signed)
Internal Medicine Clinic Attending  CCM services provided by the care management provider and their documentation were reviewed with Dr. Christian.  We reviewed the pertinent findings, urgent action items addressed by the resident and non-urgent items to be addressed by the PCP.  I agree with the assessment, diagnosis, and plan of care documented in the CCM and resident's note.  Aleeya Veitch N Agnes Probert, MD 12/02/2020  

## 2020-12-06 NOTE — Progress Notes (Signed)
Subjective: Amanda [name] presents today for follow up of preventative diabetic foot care and painful mycotic nails b/l that are difficult to trim. Pain interferes with ambulation. Aggravating factors include wearing enclosed shoe gear. Pain is relieved with periodic professional debridement.   She states blood sugar was 132 mg/dl at lunch and 112 mg/dl in the office today.  She states she attempted to trim her toenails and she cut the left 4th and 5th digits. Denies any pain or infection.  Her PCP is Dr. Cato Mulligan and last visit was 11/23/2020.  Allergies  Allergen Reactions  . Gabapentin Other (See Comments)    Pt states medication made her feel "out of it".   Pt states medication made her feel "out of it".       Objective: There were no vitals filed for this visit.  Vascular Examination:  Capillary fill time to digits <3s b/l, palpable DP pulses b/l, palpable PT pulses b/l, pedal hair present b/l and skin temperature gradient within normal limits b/l  Dermatological Examination: Pedal skin with normal turgor, texture and tone bilaterally. Toenails 1-5 b/l elongated, discolored, dystrophic, thickened, crumbly with subungual debris and tenderness to dorsal palpation.   Hyperkeratotic lesion right 5th toe. No erythema, no edema, no drainage, no fluctuance. . Incurvated nailplate L hallux and R hallux with tenderness to palpation. No erythema, no edema, no drainage noted. No fluctuance.  Evidence of injury to left 4th and left 5th digits, but digits are healed.  Musculoskeletal: Normal muscle strength 5/5 to all lower extremity muscle groups bilaterally. No pain crepitus or joint limitation noted with ROM b/l. Hallux valgus with bunion deformity noted b/l lower extremities. Hammertoes noted to the R 5th toe.  Neurological: Pt has subjective symptoms of neuropathy. Protective sensation intact 5/5 intact bilaterally with 10g monofilament b/l. Vibratory sensation intact  b/l.  Assessment: Pain due to onychomycosis of toenails of both feet  Corns  Hallux valgus, acquired, bilateral  Hammer toe of right foot  Diabetic peripheral neuropathy associated with type 2 diabetes mellitus ([place])  Plan: -Patient examined. -No new findings. No new orders. -Continue diabetic foot care principles. -Toenails 1-5 b/l were debrided in length and girth without iatrogenic bleeding. Offending nail borders debrided and curretaged b/l great toes. Borders cleansed with alcohol. Antibiotic ointment applied. No further treatment required by patient. -Patient to continue soft, supportive shoe gear daily. -Corn(s) pared right 5th digit PIPJ utilizing sterile scalpel blade without incident. -Patient to report any pedal injuries to medical professional immediately. -Patient/POA to call should there be question/concern in the interim.  Return in about 3 months (around 03/02/2021) for diabetic toenails, corn(s)/callus(es).

## 2020-12-08 MED FILL — AMLODIPINE BESYLATE 10 MG T: 10 | 30 days supply | Qty: 30 | Fill #2

## 2020-12-23 ENCOUNTER — Ambulatory Visit: Payer: Medicare Other | Admitting: *Deleted

## 2020-12-23 DIAGNOSIS — E611 Iron deficiency: Secondary | ICD-10-CM

## 2020-12-23 DIAGNOSIS — E113513 Type 2 diabetes mellitus with proliferative diabetic retinopathy with macular edema, bilateral: Secondary | ICD-10-CM

## 2020-12-23 DIAGNOSIS — I10 Essential (primary) hypertension: Secondary | ICD-10-CM

## 2020-12-23 DIAGNOSIS — Z794 Long term (current) use of insulin: Secondary | ICD-10-CM

## 2020-12-23 DIAGNOSIS — E782 Mixed hyperlipidemia: Secondary | ICD-10-CM

## 2020-12-23 NOTE — Chronic Care Management (AMB) (Signed)
  Chronic Care Management   Outreach Note  12/23/2020 Name: Amanda [name] MRN: [id number] DOB: 12-09-1951  Referred by: Cato Mulligan, MD Reason for referral : Chronic Care Management   An unsuccessful telephone outreach was attempted today. The patient was referred to the case management team for assistance with care management and care coordination.   Follow Up Plan: Left message asking patient to return call to discuss best way to get educational material to her on SGLT2 inhibitors and the [place] [facility] spiral bound calendar/day planner since her clinic appointment is now on 12/30/20 instead of tomorrow. If no return call, will attempt to reach patient again in next 7 days.  [name] RN, CCM, [facility] RN Care Manager [phone]

## 2020-12-24 ENCOUNTER — Other Ambulatory Visit: Payer: Self-pay | Admitting: Student

## 2020-12-24 ENCOUNTER — Encounter: Payer: Medicare Other | Admitting: Internal Medicine

## 2020-12-24 ENCOUNTER — Ambulatory Visit (HOSPITAL_COMMUNITY): Payer: Medicare Other

## 2020-12-24 DIAGNOSIS — I10 Essential (primary) hypertension: Secondary | ICD-10-CM

## 2020-12-24 MED FILL — CHLORTHALIDONE 25 MG TABS: 25 | 30 days supply | Qty: 30 | Fill #1

## 2020-12-24 MED FILL — LANTUS SOLOSTAR 100 UNITS/M: 100 | 30 days supply | Qty: 3 | Fill #2

## 2020-12-28 ENCOUNTER — Ambulatory Visit: Payer: Medicare Other | Admitting: *Deleted

## 2020-12-28 DIAGNOSIS — E113513 Type 2 diabetes mellitus with proliferative diabetic retinopathy with macular edema, bilateral: Secondary | ICD-10-CM

## 2020-12-28 DIAGNOSIS — Z794 Long term (current) use of insulin: Secondary | ICD-10-CM

## 2020-12-28 DIAGNOSIS — E782 Mixed hyperlipidemia: Secondary | ICD-10-CM

## 2020-12-28 DIAGNOSIS — I10 Essential (primary) hypertension: Secondary | ICD-10-CM

## 2020-12-28 NOTE — Chronic Care Management (AMB) (Signed)
   12/28/2020  Amanda [name] [birth year] [id number]  Patient has routine clinic appointment scheduled for 12/30/20 at 1:45 pm so will plan to meet with patient face to face during clinic visit to complete routine chronic care management follow up assessment.  [name] RN, CCM, [facility] RN Care Manager [phone]

## 2020-12-29 ENCOUNTER — Other Ambulatory Visit: Payer: Self-pay

## 2020-12-29 ENCOUNTER — Ambulatory Visit (HOSPITAL_COMMUNITY)
Admission: RE | Admit: 2020-12-29 | Discharge: 2020-12-29 | Disposition: A | Discharge status: Home / Self Care | Payer: Medicare Other | Source: Ambulatory Visit | Attending: Internal Medicine | Admitting: Internal Medicine

## 2020-12-29 DIAGNOSIS — I361 Nonrheumatic tricuspid (valve) insufficiency: Secondary | ICD-10-CM

## 2020-12-29 DIAGNOSIS — E785 Hyperlipidemia, unspecified: Secondary | ICD-10-CM | POA: Diagnosis not present

## 2020-12-29 DIAGNOSIS — E119 Type 2 diabetes mellitus without complications: Secondary | ICD-10-CM | POA: Diagnosis not present

## 2020-12-29 DIAGNOSIS — I340 Nonrheumatic mitral (valve) insufficiency: Secondary | ICD-10-CM | POA: Insufficient documentation

## 2020-12-29 DIAGNOSIS — I10 Essential (primary) hypertension: Secondary | ICD-10-CM | POA: Insufficient documentation

## 2020-12-29 NOTE — Progress Notes (Signed)
  Echocardiogram 2D Echocardiogram has been performed.  [name] 12/29/2020, 11:50 AM

## 2020-12-30 ENCOUNTER — Ambulatory Visit: Payer: Medicare Other | Admitting: *Deleted

## 2020-12-30 ENCOUNTER — Other Ambulatory Visit: Payer: Self-pay | Admitting: Internal Medicine

## 2020-12-30 ENCOUNTER — Other Ambulatory Visit: Payer: Self-pay | Admitting: Student

## 2020-12-30 ENCOUNTER — Ambulatory Visit (INDEPENDENT_AMBULATORY_CARE_PROVIDER_SITE_OTHER): Payer: Medicare Other | Admitting: Internal Medicine

## 2020-12-30 ENCOUNTER — Encounter: Payer: Self-pay | Admitting: Internal Medicine

## 2020-12-30 ENCOUNTER — Other Ambulatory Visit: Payer: Self-pay

## 2020-12-30 VITALS — BP 145/70 | HR 79 | Temp 98.7°F | Ht 61.0 in | Wt 141.0 lb

## 2020-12-30 DIAGNOSIS — Z794 Long term (current) use of insulin: Secondary | ICD-10-CM

## 2020-12-30 DIAGNOSIS — F331 Major depressive disorder, recurrent, moderate: Secondary | ICD-10-CM

## 2020-12-30 DIAGNOSIS — E113513 Type 2 diabetes mellitus with proliferative diabetic retinopathy with macular edema, bilateral: Secondary | ICD-10-CM | POA: Diagnosis not present

## 2020-12-30 DIAGNOSIS — E118 Type 2 diabetes mellitus with unspecified complications: Secondary | ICD-10-CM

## 2020-12-30 DIAGNOSIS — E785 Hyperlipidemia, unspecified: Secondary | ICD-10-CM

## 2020-12-30 DIAGNOSIS — N1831 Chronic kidney disease, stage 3a: Secondary | ICD-10-CM

## 2020-12-30 DIAGNOSIS — I10 Essential (primary) hypertension: Secondary | ICD-10-CM

## 2020-12-30 DIAGNOSIS — I129 Hypertensive chronic kidney disease with stage 1 through stage 4 chronic kidney disease, or unspecified chronic kidney disease: Secondary | ICD-10-CM

## 2020-12-30 LAB — POCT GLYCOSYLATED HEMOGLOBIN (HGB A1C): Hemoglobin A1C: 8.6 % — AB (ref 4.0–5.6)

## 2020-12-30 LAB — ECHOCARDIOGRAM COMPLETE
Area-P 1/2: 5.66 cm2
S' Lateral: 2.8 cm

## 2020-12-30 LAB — GLUCOSE, CAPILLARY: Glucose-Capillary: 131 mg/dL — ABNORMAL HIGH (ref 70–99)

## 2020-12-30 MED ORDER — INSULIN GLARGINE 100 UNIT/ML SOLOSTAR PEN: 5.0000 [IU] | PEN_INJECTOR | Freq: Every day | SUBCUTANEOUS | 0 refills | Status: DC

## 2020-12-30 MED ORDER — METFORMIN HCL 1000 MG PO TABS: 1000.0000 mg | ORAL_TABLET | Freq: Two times a day (BID) | ORAL | 2 refills | Status: DC

## 2020-12-30 MED ORDER — CHLORTHALIDONE 25 MG PO TABS: 25.0000 mg | ORAL_TABLET | Freq: Every day | ORAL | 0 refills | Status: DC

## 2020-12-30 MED ORDER — AMLODIPINE BESYLATE 10 MG PO TABS: 10.0000 mg | ORAL_TABLET | Freq: Every day | ORAL | 2 refills | Status: DC

## 2020-12-30 MED ORDER — LISINOPRIL 10 MG PO TABS: 10.0000 mg | ORAL_TABLET | Freq: Every day | ORAL | 0 refills | Status: DC

## 2020-12-30 MED ORDER — ATORVASTATIN CALCIUM 40 MG PO TABS: 40.0000 mg | ORAL_TABLET | Freq: Every day | ORAL | 1 refills | Status: DC

## 2020-12-30 MED FILL — ACCU-CHEK AVIVA PLUS STRP: 50 days supply | Qty: 200 | Fill #0

## 2020-12-30 MED FILL — LISINOPRIL 10 MG TABS: 10 | 30 days supply | Qty: 30 | Fill #0

## 2020-12-30 MED FILL — METFORMIN HCL 1000 MG TABS: 1000 | 30 days supply | Qty: 60 | Fill #0

## 2020-12-30 NOTE — Patient Instructions (Signed)
Thank you, Amanda [name] for allowing [place] to provide your care today. Today we discussed Diabetes and Blood pressure.    I have ordered the following labs for you:   Lab Orders     BMP8+Anion Gap     Glucose, capillary     POC Hbg A1C   Tests ordered today:  Continuous Glucose Monitor  Referrals ordered today:   Referral Orders  No referral(s) requested today     I have ordered the following medication/changed the following medications:   Stop the following medications: Medications Discontinued During This Encounter  Medication Reason  . amLODipine (NORVASC) 10 MG tablet Reorder  . atorvastatin (LIPITOR) 40 MG tablet Reorder  . lisinopril (ZESTRIL) 10 MG tablet Reorder  . insulin glargine (LANTUS) 100 UNIT/ML Solostar Pen Reorder  . metFORMIN (GLUCOPHAGE) 1000 MG tablet Reorder  . chlorthalidone (HYGROTON) 25 MG tablet Reorder     Start the following medications: Meds ordered this encounter  Medications  . metFORMIN (GLUCOPHAGE) 1000 MG tablet    Sig: Take 1 tablet (1,000 mg total) by mouth 2 (two) times daily with a meal.    Dispense:  60 tablet    Refill:  2  . amLODipine (NORVASC) 10 MG tablet    Sig: Take 1 tablet (10 mg total) by mouth daily.    Dispense:  30 tablet    Refill:  2  . lisinopril (ZESTRIL) 10 MG tablet    Sig: Take 1 tablet (10 mg total) by mouth daily.    Dispense:  30 tablet    Refill:  0  . chlorthalidone (HYGROTON) 25 MG tablet    Sig: Take 1 tablet (25 mg total) by mouth daily.    Dispense:  90 tablet    Refill:  0  . atorvastatin (LIPITOR) 40 MG tablet    Sig: Take 1 tablet (40 mg total) by mouth daily at 6 PM.    Dispense:  90 tablet    Refill:  1    PATIENT REQUEST REFILL  . insulin glargine (LANTUS) 100 UNIT/ML Solostar Pen    Sig: Inject 5 Units into the skin at bedtime.    Dispense:  15 mL    Refill:  0     Follow up: 3 months    Remember: none  Should you have any questions or concerns please call the internal  medicine clinic at [phone].     Marianna Payment, D.O. [place]

## 2020-12-30 NOTE — Progress Notes (Signed)
CC: DM  HPI:  [name] is a 70 y.o. female with a past medical history stated below and presents today for DM. Please see problem based assessment and plan for additional details.  Past Medical History:  Diagnosis Date  . Allergy    SEASONAL  . Anemia   . Arthritis   . Cataract    PRESENT IN LEFT/ CATARACTS REMOVED FROM RIGHT  . Diabetes mellitus 1996  . DKA (diabetic ketoacidoses) 01/02/2013  . Hyperlipidemia   . Hypertension   . Hypotension   . Influenza A (H1N1) 01/03/2013  . Neuromuscular disorder ([place])    NEUROPATHY  . SYNCOPE 05/10/2010   Qualifier: Diagnosis of  By: Marilynne Halsted RN, BSN, [place]      Current Outpatient Medications on File Prior to Visit  Medication Sig Dispense Refill  . Accu-Chek Softclix Lancets lancets USE AS DIRECTED UP TO FOUR TIMES DAILY 200 each 0  . acetaminophen (TYLENOL) 325 MG tablet Take 2 tablets (650 mg total) by mouth every 6 (six) hours as needed (or Fever >/= 101). 60 tablet 0  . blood glucose meter kit and supplies KIT Dispense based on patient and insurance preference. Use up to four times daily as directed. . 1 each 0  . cetirizine (ZYRTEC) 5 MG tablet Take 1 tablet (5 mg total) by mouth daily. 30 tablet 1  . Cholecalciferol (VITAMIN D3) 2000 units capsule Take by mouth daily.     . diclofenac Sodium (VOLTAREN) 1 % GEL Apply topically.    . Ferrous Sulfate (IRON) 325 (65 Fe) MG TABS Take 1 tablet (325 mg total) by mouth daily. 30 tablet 0  . fluticasone (FLONASE) 50 MCG/ACT nasal spray Place 2 sprays into both nostrils daily. 16 g 2  . Insulin Pen Needle 32G X 4 MM MISC 30 each by Does not apply route daily. 30 each 3  . latanoprost (XALATAN) 0.005 % ophthalmic solution 1 drop at bedtime.    . Multiple Vitamins-Minerals (WOMENS 50+ MULTI VITAMIN/MIN) TABS Take 1 tablet by mouth daily. 30 tablet    No current facility-administered medications on file prior to visit.    Family History  Problem Relation Age of Onset  .  Diabetes Mother   . Hypertension Mother   . Breast cancer Mother   . Aneurysm Father   . Diabetes Sister   . Hypertension Sister   . Kidney disease Sister        One Kidney transplant, one sister on HD  . Breast cancer Sister   . Colon cancer Neg Hx   . Esophageal cancer Neg Hx     Social History   Socioeconomic History  . Marital status: Married    Spouse name: Not on file  . Number of children: 1  . Years of education: Not on file  . Highest education level: Not on file  Occupational History  . Occupation: retired  Tobacco Use  . Smoking status: Never Smoker  . Smokeless tobacco: Never Used  Vaping Use  . Vaping Use: Never used  Substance and Sexual Activity  . Alcohol use: No    Alcohol/week: 0.0 standard drinks  . Drug use: No  . Sexual activity: Not Currently    Birth control/protection: Surgical  Other Topics Concern  . Not on file  Social History Narrative  . Not on file   Social Determinants of Health   Financial Resource Strain: Medium Risk  . Difficulty of Paying Living Expenses: Somewhat hard  Food Insecurity: Food  Insecurity Present  . Worried About Running Out of Food in the Last Year: Sometimes true  . Ran Out of Food in the Last Year: Sometimes true  Transportation Needs: No Transportation Needs  . Lack of Transportation (Medical): No  . Lack of Transportation (Non-Medical): No  Physical Activity: Not on file  Stress: Not on file  Social Connections: Not on file  Intimate Partner Violence: Not on file    Review of Systems: ROS negative except for what is noted on the assessment and plan.  Vitals:   12/30/20 1343 12/30/20 1415  BP: (!) 158/78 (!) 145/70  Pulse: 87 79  Temp: 98.7 F (37.1 C)   TempSrc: Oral   SpO2: 100%   Weight: 141 lb (64 kg)   Height: 5' 1" (1.549 m)      Physical Exam: Physical Exam Constitutional:      Appearance: Normal appearance.  HENT:     Head: Normocephalic and atraumatic.  Eyes:     Extraocular  Movements: Extraocular movements intact.  Cardiovascular:     Rate and Rhythm: Normal rate.     Pulses: Normal pulses.     Heart sounds: Normal heart sounds.  Pulmonary:     Effort: Pulmonary effort is normal.     Breath sounds: Normal breath sounds.  Abdominal:     General: Bowel sounds are normal.     Palpations: Abdomen is soft.     Tenderness: There is no abdominal tenderness.  Musculoskeletal:        General: Normal range of motion.     Cervical back: Normal range of motion.     Right lower leg: No edema.     Left lower leg: No edema.  Skin:    General: Skin is warm and dry.  Neurological:     Mental Status: She is alert and oriented to person, place, and time. Mental status is at baseline.  Psychiatric:        Mood and Affect: Mood normal.      Assessment & Plan:   See Encounters Tab for problem based charting.  Patient discussed with Dr. Raines    , D.O. [facility], PGY-2 Pager: [phone], Phone: [phone] Date 01/01/2021 Time 9:18 AM       Document Information  Photos    12/30/2020 14:13  Attached To:  Office Visit on 12/30/20 with , , MD   Source Information  , , MD  [facility]    Media Information         Document Information  Photos    12/30/2020 14:13  Attached To:  Office Visit on 12/30/20 with , , MD   Source Information  , , MD  [facility]    

## 2020-12-31 ENCOUNTER — Telehealth: Payer: Self-pay | Admitting: Dietician

## 2020-12-31 ENCOUNTER — Ambulatory Visit: Payer: Medicare Other | Admitting: *Deleted

## 2020-12-31 DIAGNOSIS — E118 Type 2 diabetes mellitus with unspecified complications: Secondary | ICD-10-CM

## 2020-12-31 DIAGNOSIS — F331 Major depressive disorder, recurrent, moderate: Secondary | ICD-10-CM

## 2020-12-31 DIAGNOSIS — Z794 Long term (current) use of insulin: Secondary | ICD-10-CM

## 2020-12-31 DIAGNOSIS — E785 Hyperlipidemia, unspecified: Secondary | ICD-10-CM

## 2020-12-31 DIAGNOSIS — I10 Essential (primary) hypertension: Secondary | ICD-10-CM

## 2020-12-31 LAB — BMP8+ANION GAP
Anion Gap: 14 mmol/L (ref 10.0–18.0)
BUN/Creatinine Ratio: 28 (ref 12–28)
BUN: 40 mg/dL — ABNORMAL HIGH (ref 8–27)
CO2: 20 mmol/L (ref 20–29)
Calcium: 8.9 mg/dL (ref 8.7–10.3)
Chloride: 108 mmol/L — ABNORMAL HIGH (ref 96–106)
Creatinine, Ser: 1.43 mg/dL — ABNORMAL HIGH (ref 0.57–1.00)
GFR calc Af Amer: 43 mL/min/{1.73_m2} — ABNORMAL LOW (ref >59)
GFR calc non Af Amer: 38 mL/min/{1.73_m2} — ABNORMAL LOW (ref >59)
Glucose: 135 mg/dL — ABNORMAL HIGH (ref 65–99)
Potassium: 4.7 mmol/L (ref 3.5–5.2)
Sodium: 142 mmol/L (ref 134–144)

## 2020-12-31 MED FILL — AMLODIPINE BESYLATE 10 MG T: 10 | 30 days supply | Qty: 30 | Fill #0

## 2020-12-31 NOTE — Telephone Encounter (Signed)
Amanda [name] agreed to come into the office on Friday 1/21 at 10:15 am for her appointment for Continuous glucose monitoring.

## 2020-12-31 NOTE — Progress Notes (Signed)
Internal Medicine Clinic Resident  I have personally reviewed this encounter including the documentation in this note and/or discussed this patient with the care management provider. I will address any urgent items identified by the care management provider and will communicate my actions to the patient's PCP. I have reviewed the patient's CCM visit with my supervising attending, Dr Rebeca Alert.  Harvie Heck, MD  [facility] PGY-2 12/31/2020

## 2020-12-31 NOTE — Chronic Care Management (AMB) (Signed)
Chronic Care Management   CCM RN Visit Note  12/30/2020 Name: Amanda [name] MRN: [id number] DOB: Dec 02, 1951  Subjective: [name] is a 70 y.o. year old female who is a primary care patient of Cato Mulligan, MD. The care management team was consulted for assistance with disease management and care coordination needs.    Engaged with patient face to face for follow up visit in response to provider referral for case management and/or care coordination services.   Consent to Services:  The patient was given information about Chronic Care Management services, agreed to services, and gave verbal consent prior to initiation of services.  Please see initial visit note for detailed documentation.   Patient agreed to services and verbal consent obtained.   Assessment: Review of patient past medical history, allergies, medications, health status, including review of consultants reports, laboratory and other test data, was performed as part of comprehensive evaluation and provision of chronic care management services.   SDOH (Social Determinants of Health) assessments and interventions performed:    CCM Care Plan  Allergies  Allergen Reactions  . Gabapentin Other (See Comments)    Pt states medication made her feel "out of it".   Pt states medication made her feel "out of it".      Outpatient Encounter Medications as of 12/30/2020  Medication Sig Note  . ACCU-CHEK AVIVA PLUS test strip USE AS DIRECTED UP TO FOUR TIMES DAILY   . Accu-Chek Softclix Lancets lancets USE AS DIRECTED UP TO FOUR TIMES DAILY   . acetaminophen (TYLENOL) 325 MG tablet Take 2 tablets (650 mg total) by mouth every 6 (six) hours as needed (or Fever >/= 101).   [name] amLODipine (NORVASC) 10 MG tablet Take 1 tablet (10 mg total) by mouth daily.   [name] atorvastatin (LIPITOR) 40 MG tablet Take 1 tablet (40 mg total) by mouth daily at 6 PM.   . blood glucose meter kit and supplies KIT Dispense based on patient and insurance  preference. Use up to four times daily as directed. .   . cetirizine (ZYRTEC) 5 MG tablet Take 1 tablet (5 mg total) by mouth daily.   . chlorthalidone (HYGROTON) 25 MG tablet Take 1 tablet (25 mg total) by mouth daily.   . Cholecalciferol (VITAMIN D3) 2000 units capsule Take by mouth daily.    . diclofenac Sodium (VOLTAREN) 1 % GEL Apply topically. 04/08/2020: Uses prn  . Ferrous Sulfate (IRON) 325 (65 Fe) MG TABS Take 1 tablet (325 mg total) by mouth daily.   . fluticasone (FLONASE) 50 MCG/ACT nasal spray Place 2 sprays into both nostrils daily.   . insulin glargine (LANTUS) 100 UNIT/ML Solostar Pen Inject 5 Units into the skin at bedtime.   . Insulin Pen Needle 32G X 4 MM MISC 30 each by Does not apply route daily.   [name] latanoprost (XALATAN) 0.005 % ophthalmic solution 1 drop at bedtime. 09/10/2019: LF 6.4.20 #2.5/30  . lisinopril (ZESTRIL) 10 MG tablet Take 1 tablet (10 mg total) by mouth daily.   . metFORMIN (GLUCOPHAGE) 1000 MG tablet Take 1 tablet (1,000 mg total) by mouth 2 (two) times daily with a meal.   . Multiple Vitamins-Minerals (WOMENS 50+ MULTI VITAMIN/MIN) TABS Take 1 tablet by mouth daily.    No facility-administered encounter medications on file as of 12/30/2020.    Patient Active Problem List   Diagnosis Date Noted  . Chronic diarrhea 06/10/2020  . Respiratory failure ([place])   . Hypertensive emergency   . Gait  abnormality 01/10/2019  . Near syncope 11/26/2018  . Nail fungus 10/23/2017  . Cervical radiculopathy due to degenerative joint disease of spine 05/03/2017  . 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 Pain of both shoulder joints 11/14/2016  . Orthostatic hypotension 09/15/2016  . Vitreous hemorrhage of left eye ([place]) 05/13/2016  . Osteopenia 05/04/2016  . Posterior capsular opacification of left eye, obscuring vision 02/15/2016  . MDD (major depressive disorder), recurrent episode, moderate ([place]) 02/08/2016  . Pseudophakia of both eyes 07/27/2015  . Diabetic  neuropathy ([place]) 05/20/2013  . Type 2 diabetes mellitus with both eyes affected by proliferative retinopathy and macular edema, with long-term current use of insulin ([place]) 04/19/2013  . Seasonal allergies 03/12/2013  . Dyslipidemia 02/15/2013  . DM II (diabetes mellitus, type II), controlled ([place]) 02/14/2013  . Normocytic anemia 02/14/2013  . Essential hypertension 05/10/2010    Conditions to be addressed/monitored:IDDM,HLD, HTN, CKD, Depression  Care Plan : CCM RN- Wellness (Adult)- Chronic Disease states of IDDM, HLD, HTN, CKD and depression  Updates made by Barrington Ellison, RN since 12/31/2020 12:00 AM    Problem: Chronic Disease States of IDDM, HTN, HLD, CKD Depression   Priority: High  Note:   CARE PLAN ENTRY (see longitudinal plan of care for additional care plan information)  Current Barriers:  . Chronic Disease Management support, education, and care coordination needs related to HTN, HLD, DMII, and CKD - spoke with patient during clinic visit  to complete follow up assessment, patient reports fasting and premeal blood sugar variance of 111-141, BP variance 112-129/67-80, denies symptoms of hypoglycemia, says she is not on an SGLT2 I because her insurance wouldn't approve the combination pill with Metformin and Jardiance, says she is pleased her A1C had improved significantly although still not to goal of <8.0%  . Clinical Goal(s) related to HTN, HLD, DMII, CKD and depression. Over the next 30- 60 days, patient will:  . Work with the care management team to address educational, disease management, and care coordination needs  . Begin or continue self health monitoring activities as directed today Measure and record CBG (blood glucose) 1-2 times daily and Measure and record blood pressure 5-7 times per week . Call provider office for new or worsened signs and symptoms Blood glucose findings outside established parameters, Blood pressure findings outside established parameters, and  New or worsened symptom related to HTN, HLD, DMII, and chronic diarrhea . Call care management team with questions or concerns . Verbalize basic understanding of patient centered plan of care established today  Interventions related to HTN, HLD, DMII, and CKD. [name] Appropriate assessments completed  . Inter-disciplinary care team collaboration (see longitudinal plan of care) . Evaluation of current treatment plan related to HTN, HLD , IDDM, depression and patient's adherence to plan as established by provider. . Reviewed clinic visit note of 1/12 and labs results . Reviewed medication changes to DM treatment plan. Discussed mechanism of action of Lantus and that fasting blood sugar values assess efficacy of Lantus. Discussed recommendations for SGLT2 inhibitor in the future. . Provided handout on SGLT2 inhibitor and gave patient [place] [facility] spiral bound day planner with chronic  disease management education for DM, and HTN. [name] Discussed plans with patient for ongoing care management follow up and provided patient with direct contact information for care management team  Patient Self Care Activities related to HTN, HLD, DMII, CKD and depression. . Patient is unable to independently self-manage chronic health conditions . - check blood pressure daily . -  choose a place to take my blood pressure (home, clinic or office, retail store) . - write blood pressure results in a log or diary . - check blood sugar at prescribed times . - check blood sugar if I feel it is too high or too low . - take the blood sugar meter to all doctor visits . - ask family or friend for a ride . - call to cancel if needed . - keep a calendar with appointment dates      Plan:The care management team will reach out to the patient again over the next 30-60 days.   [name] RN, CCM, [facility] RN Care Manager [phone]

## 2020-12-31 NOTE — Chronic Care Management (AMB) (Signed)
Chronic Care Management   CCM RN Visit Note  12/31/2020 Name: Amanda [name] MRN: [id number] DOB: Jan 29, 1951  Subjective: Amanda [name] is a 70 y.o. year old female who is a primary care patient of Cato Mulligan, MD. The care management team was consulted for assistance with disease management and care coordination needs.    Engaged with patient by telephone for follow up visit in response to provider referral for case management and/or care coordination services.   Consent to Services:  The patient was given information about Chronic Care Management services, agreed to services, and gave verbal consent prior to initiation of services.  Please see initial visit note for detailed documentation.   Patient agreed to services and verbal consent obtained.   Assessment: Review of patient past medical history, allergies, medications, health status, including review of consultants reports, laboratory and other test data, was performed as part of comprehensive evaluation and provision of chronic care management services.   SDOH (Social Determinants of Health) assessments and interventions performed:    CCM Care Plan  Allergies  Allergen Reactions  . Gabapentin Other (See Comments)    Pt states medication made her feel "out of it".   Pt states medication made her feel "out of it".      Outpatient Encounter Medications as of 12/31/2020  Medication Sig Note  . ACCU-CHEK AVIVA PLUS test strip USE AS DIRECTED UP TO FOUR TIMES DAILY   . Accu-Chek Softclix Lancets lancets USE AS DIRECTED UP TO FOUR TIMES DAILY   . acetaminophen (TYLENOL) 325 MG tablet Take 2 tablets (650 mg total) by mouth every 6 (six) hours as needed (or Fever >/= 101).   [name] amLODipine (NORVASC) 10 MG tablet Take 1 tablet (10 mg total) by mouth daily.   [name] atorvastatin (LIPITOR) 40 MG tablet Take 1 tablet (40 mg total) by mouth daily at 6 PM.   . blood glucose meter kit and supplies KIT Dispense based on patient and insurance  preference. Use up to four times daily as directed. .   . cetirizine (ZYRTEC) 5 MG tablet Take 1 tablet (5 mg total) by mouth daily.   . chlorthalidone (HYGROTON) 25 MG tablet Take 1 tablet (25 mg total) by mouth daily.   . Cholecalciferol (VITAMIN D3) 2000 units capsule Take by mouth daily.    . diclofenac Sodium (VOLTAREN) 1 % GEL Apply topically. 04/08/2020: Uses prn  . Ferrous Sulfate (IRON) 325 (65 Fe) MG TABS Take 1 tablet (325 mg total) by mouth daily.   . fluticasone (FLONASE) 50 MCG/ACT nasal spray Place 2 sprays into both nostrils daily.   . insulin glargine (LANTUS) 100 UNIT/ML Solostar Pen Inject 5 Units into the skin at bedtime.   . Insulin Pen Needle 32G X 4 MM MISC 30 each by Does not apply route daily.   [name] latanoprost (XALATAN) 0.005 % ophthalmic solution 1 drop at bedtime. 09/10/2019: LF 6.4.20 #2.5/30  . lisinopril (ZESTRIL) 10 MG tablet Take 1 tablet (10 mg total) by mouth daily.   . metFORMIN (GLUCOPHAGE) 1000 MG tablet Take 1 tablet (1,000 mg total) by mouth 2 (two) times daily with a meal.   . Multiple Vitamins-Minerals (WOMENS 50+ MULTI VITAMIN/MIN) TABS Take 1 tablet by mouth daily.    No facility-administered encounter medications on file as of 12/31/2020.    Patient Active Problem List   Diagnosis Date Noted  . Chronic diarrhea 06/10/2020  . Respiratory failure ([place])   . Hypertensive emergency   . Gait abnormality  01/10/2019  . Near syncope 11/26/2018  . Nail fungus 10/23/2017  . Cervical radiculopathy due to degenerative joint disease of spine 05/03/2017  . 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 Pain of both shoulder joints 11/14/2016  . Orthostatic hypotension 09/15/2016  . Vitreous hemorrhage of left eye ([place]) 05/13/2016  . Osteopenia 05/04/2016  . Posterior capsular opacification of left eye, obscuring vision 02/15/2016  . MDD (major depressive disorder), recurrent episode, moderate ([place]) 02/08/2016  . Pseudophakia of both eyes 07/27/2015  . Diabetic  neuropathy ([place]) 05/20/2013  . Type 2 diabetes mellitus with both eyes affected by proliferative retinopathy and macular edema, with long-term current use of insulin ([place]) 04/19/2013  . Seasonal allergies 03/12/2013  . Dyslipidemia 02/15/2013  . DM II (diabetes mellitus, type II), controlled ([place]) 02/14/2013  . Normocytic anemia 02/14/2013  . Essential hypertension 05/10/2010    Conditions to be addressed/monitored:IDDM,HLD, HTN, CKD, Depression  Care Plan : CCM RN- Wellness (Adult)- Chronic Disease states of IDDM, HLD, HTN, CKD and Depression  Updates made by Barrington Ellison, RN since 12/31/2020 12:00 AM    Problem: Chronic Disease States of IDDM, HTN, HLD, CKD Depression   Priority: High  Note:   CARE PLAN ENTRY (see longitudinal plan of care for additional care plan information)  Current Barriers:  . Chronic Disease Management support, education, and care coordination needs related to HTN, HLD, DMII, and CKD - spoke with patient during clinic visit  to complete follow up assessment, patient reports fasting and premeal blood sugar variance of 111-141, BP variance 112-129/67-80, denies symptoms of hypoglycemia, says she is not on an SGLT2 I because her insurance wouldn't approve the combination pill with Metformin and Jardiance, says she is pleased her A1C had improved significantly although still not to goal of <8.0%  . Clinical Goal(s) related to HTN, HLD, DMII, CKD and depression. Over the next 30- 60 days, patient will:  . Work with the care management team to address educational, disease management, and care coordination needs  . Begin or continue self health monitoring activities as directed today Measure and record CBG (blood glucose) 1-2 times daily and Measure and record blood pressure 5-7 times per week . Call provider office for new or worsened signs and symptoms Blood glucose findings outside established parameters, Blood pressure findings outside established parameters, and  New or worsened symptom related to HTN, HLD, DMII, and chronic diarrhea . Call care management team with questions or concerns . Verbalize basic understanding of patient centered plan of care established today  Interventions related to HTN, HLD, DMII, and CKD. [name] Appropriate assessments completed  . Inter-disciplinary care team collaboration (see longitudinal plan of care) . Evaluation of current treatment plan related to HTN, HLD , IDDM, depression and patient's adherence to plan as established by provider. . Reviewed clinic visit note of 1/12 and labs results . Reviewed medication changes to DM treatment plan. Discussed mechanism of action of Lantus and that fasting blood sugar values assess efficacy of Lantus. Discussed recommendations for SGLT2 inhibitor in the future. . Provided handout on SGLT2 inhibitor and gave patient [place] [facility] spiral bound day planner with chronic  disease management education for DM, and HTN. [name] Discussed plans with patient for ongoing care management follow up and provided patient with direct contact information for care management team . 12/31/20- With patient's agreement via phone, mailed information on the [employer] "Ask The Experts"  podcast series  Patient Self Care Activities related to HTN, HLD, DMII,  CKD and depression. . Patient is unable to independently self-manage chronic health conditions . - check blood pressure daily . - choose a place to take my blood pressure (home, clinic or office, retail store) . - write blood pressure results in a log or diary . - check blood sugar at prescribed times . - check blood sugar if I feel it is too high or too low . - take the blood sugar meter to all doctor visits . - check out the ADA "Ask The Experts" podcast series . - ask family or friend for a ride . - call to cancel if needed . - keep a calendar with appointment dates      Plan:The care management team  will reach out to the patient again over the next 30-60 days.    [name] RN, CCM, [facility] RN Care Manager [phone]

## 2020-12-31 NOTE — Patient Instructions (Signed)
Visit Information It was nice meeting  with you today.  Patient Care Plan: CCM RN- Wellness (Adult)- Chronic Disease states of IDDM, HLD, HTN, CKD and Depression    Problem Identified: Chronic Disease States of IDDM, HTN, HLD, CKD Depression   Priority: High  Note:   CARE PLAN ENTRY (see longitudinal plan of care for additional care plan information)  Current Barriers:  . Chronic Disease Management support, education, and care coordination needs related to HTN, HLD, DMII, and CKD - spoke with patient during clinic visit  to complete follow up assessment, patient reports fasting and premeal blood sugar variance of 111-141, BP variance 112-129/67-80, denies symptoms of hypoglycemia, says she is not on an SGLT2 I because her insurance wouldn't approve the combination pill with Metformin and Jardiance, says she is pleased her A1C had improved significantly although still not to goal of <8.0%  . Clinical Goal(s) related to HTN, HLD, DMII, CKD and depression. Over the next 30- 60 days, patient will:  . Work with the care management team to address educational, disease management, and care coordination needs  . Begin or continue self health monitoring activities as directed today Measure and record CBG (blood glucose) 1-2 times daily and Measure and record blood pressure 5-7 times per week . Call provider office for new or worsened signs and symptoms Blood glucose findings outside established parameters, Blood pressure findings outside established parameters, and New or worsened symptom related to HTN, HLD, DMII, and chronic diarrhea . Call care management team with questions or concerns . Verbalize basic understanding of patient centered plan of care established today  Interventions related to HTN, HLD, DMII, and CKD. [name] Appropriate assessments completed  . Inter-disciplinary care team collaboration (see longitudinal plan of care) . Evaluation of current treatment plan related to HTN, HLD , IDDM,  depression and patient's adherence to plan as established by provider. . Reviewed clinic visit note of 1/12 and labs results . Reviewed medication changes to DM treatment plan. Discussed mechanism of action of Lantus and that fasting blood sugar values assess efficacy of Lantus. Discussed recommendations for SGLT2 inhibitor in the future. . Provided handout on SGLT2 inhibitor and gave patient [place] [facility] spiral bound day planner with chronic  disease management education for DM, and HTN. [name] Discussed plans with patient for ongoing care management follow up and provided patient with direct contact information for care management team  Patient Self Care Activities related to HTN, HLD, DMII, CKD and depression. . Patient is unable to independently self-manage chronic health conditions . - check blood pressure daily . - choose a place to take my blood pressure (home, clinic or office, retail store) . - write blood pressure results in a log or diary . - check blood sugar at prescribed times . - check blood sugar if I feel it is too high or too low . - take the blood sugar meter to all doctor visits . - ask family or friend for a ride . - call to cancel if needed . - keep a calendar with appointment dates       The patient verbalized understanding of instructions, educational materials, and care plan provided today and declined offer to receive copy of patient instructions, educational materials, and care plan.   The care management team will reach out to the patient again over the next 30-60 days.   [name] RN, CCM, [facility] RN Care Manager [phone]

## 2021-01-01 ENCOUNTER — Encounter: Payer: Self-pay | Admitting: Internal Medicine

## 2021-01-01 DIAGNOSIS — N1832 Chronic kidney disease, stage 3b: Secondary | ICD-10-CM | POA: Insufficient documentation

## 2021-01-01 DIAGNOSIS — N183 Chronic kidney disease, stage 3 unspecified: Secondary | ICD-10-CM

## 2021-01-01 DIAGNOSIS — N184 Chronic kidney disease, stage 4 (severe): Secondary | ICD-10-CM | POA: Insufficient documentation

## 2021-01-01 HISTORY — DX: Chronic kidney disease, stage 3 unspecified: N18.30

## 2021-01-01 NOTE — Assessment & Plan Note (Signed)
Patient has CKD stage IIIa-IIIb.  This is likely secondary to her ongoing hypertension and diabetes.  Repeat BMP today.

## 2021-01-01 NOTE — Assessment & Plan Note (Signed)
>>  ASSESSMENT AND PLAN FOR CKD (CHRONIC KIDNEY DISEASE), STAGE IV (HCC) WRITTEN ON 01/01/2021  9:15 AM BY COE, BENJAMIN, MD  Patient has CKD stage IIIa-IIIb.  This is likely secondary to her ongoing hypertension and diabetes.  Repeat BMP today.

## 2021-01-01 NOTE — Assessment & Plan Note (Addendum)
Assessment: Patient presents today for further evaluation and management of her HTN.   Her blood pressure is uncontrolled on the following medications:  1. Amlodipine 10 mg daily 2. Lisinopril 10 mg daily 3. Chlorthalidone 25 mg   She admits to being adherent with her antihypertensive medications.   She identifies the following barriers: none.   She admits to no side effects noted.   She is currently taking the following medications, which could be contributing to his HTN: none  She has the following underlying comorbidities that could affect his HTN treatment: CKD (Stage 3a-3b), IDDM, PAD, advanced age (>73 y.o.)   Current exercise regimen: unknown  Diet habits: unknown  Checking BP at home: Yes , range 165/84 to 112/70 (see picture of log in clinic note)  Current blood pressure for today's visit is listed below:  Blood Pressure 12/30/2020 11/23/2020 10/28/2020 10/12/2020 10/07/2020  BP 145/70 147/75 147/78 176/70 137/77  Some recent data might be hidden    Current metabolic panels:  BMP Latest Ref Rng & Units 12/30/2020 11/23/2020 10/12/2020  Glucose 65 - 99 mg/dL 135(H) 110(H) 386(H)  BUN 8 - 27 mg/dL 40(H) 32(H) 30(H)  Creatinine 0.57 - 1.00 mg/dL 1.43(H) 1.49(H) 1.44(H)  BUN/Creat Ratio 12 - 28 28 21 21   Sodium 134 - 144 mmol/L 142 141 137  Potassium 3.5 - 5.2 mmol/L 4.7 4.8 4.5  Chloride 96 - 106 mmol/L 108(H) 107(H) 99  CO2 20 - 29 mmol/L 20 22 24   Calcium 8.7 - 10.3 mg/dL 8.9 9.3 9.4     Patient has clinic blood pressures under not controlled with wide variations and her blood pressures at home.  She states that she is taking all of her medications, but I have concerns that she may miss some doses could account for her wide fluctuations.  Patient also has a widened pulse pressure which likely is secondary to her known aortic atherosclerotic disease that was seen on CT imaging in 2020.  This could limit further antihypertensive medications due to low diastolic  pressures.  Plan: 1. Continue current antihypertensive medications 2. Goal BP: <130/80 3. Ordered BMP today 4. Instructed to keep a BP log: twice daily 5. Recommended DASH diet, maintaining < 2.3 g/day Salt intake, and limiting ETOH use (< 2 drinks/day for women and < 3 drinks/day for Men) 6. Recommended Weight loss & aerobic exercise  (at least 150 minutes of moderate-intensity - 30 minutes for 5+ days/week)

## 2021-01-01 NOTE — Assessment & Plan Note (Addendum)
Patient presents for further evaluation and management of her DM.   Her DM is uncontrolled on the following medications:  1. Lantus 10 units daily  2. Metformin 1000 mg daily  She admits to being compliant with her diabetes medications.   She denies foot ulcerations, nausea, polydipsia, polyuria, visual disturbances, vomitting and weight loss. She does admit to peripheral neuropathy and morning asymptomatic hypoglycemia.   She does have a glucometer and brought her CBG log with her today (See clinic note for log). CBG range: 170 - 119 ( multiple episode of hypoglycemia, with the two lost being 52 and 61)  Current exercise level: unknown  Current diet: unknown  Last eye exam was on 01/16/18 and showed fundoscopic exam consistent with diabetes retinopathy.   Last foot exam was today and showed warm, good capillary refill and no lesions. She will need a monofilament test at her next visit.  Patients current random blood sugar was  Glucose-Capillary  Date/Time Value Ref Range Status  12/30/2020 01:56 PM 131 (H) 70 - 99 mg/dL Final    Comment:    Glucose reference range applies only to samples taken after fasting for at least 8 hours.     Last A1c was  Lab Results  Component Value Date   HGBA1C 8.6 (A) 12/30/2020  .   Patients current weight and BMI:  Filed Weights   12/30/20 1343  Weight: 141 lb (64 kg)     Body mass index is 26.64 kg/m.   She has identified the following barriers to her DM management: none.   We identified the following goals during this appointment: daily monitoring and recording of CBG adherence to prescribed medication regimen  Plan: 1. Decrease Lantus to 5 units daily due to low blood sugars and continue metformin 1000 mg BiD 2. Will set patient up for a CGM  3. Will need monofilament test at next appointment.  4. Counseled patient regarding moderate-intensity aerobic exercise for 30-60 minutes most days 5. Counseled patient regarding weight  loss 6. Target Range blood sugar: 100-120 and target range for A1c: <8.

## 2021-01-05 ENCOUNTER — Other Ambulatory Visit: Payer: Self-pay | Admitting: Internal Medicine

## 2021-01-05 ENCOUNTER — Other Ambulatory Visit: Payer: Self-pay | Admitting: Student

## 2021-01-05 DIAGNOSIS — I10 Essential (primary) hypertension: Secondary | ICD-10-CM

## 2021-01-05 NOTE — Telephone Encounter (Signed)
Appt has been attached to the Referral for 01/08/2021 @ 10:15 Am.

## 2021-01-05 NOTE — Progress Notes (Signed)
Internal Medicine Clinic Attending  Case discussed with Dr. Coe at the time of the visit.  We reviewed the resident's history and exam and pertinent patient test results.  I agree with the assessment, diagnosis, and plan of care documented in the resident's note.  Deldrick Linch, M.D., Ph.D.  

## 2021-01-08 ENCOUNTER — Encounter: Payer: Self-pay | Admitting: Dietician

## 2021-01-08 ENCOUNTER — Other Ambulatory Visit: Payer: Self-pay

## 2021-01-08 ENCOUNTER — Ambulatory Visit: Payer: Medicare Other | Admitting: Dietician

## 2021-01-08 NOTE — Progress Notes (Signed)
Amanda [name] rescheduled this appointment due to lack of transportation. She intends to schedule an eye exam and ask then to fax [place] the results. Amanda [name], RD 01/08/2021 10:24 AM.

## 2021-01-12 ENCOUNTER — Other Ambulatory Visit: Payer: Self-pay

## 2021-01-12 ENCOUNTER — Ambulatory Visit (INDEPENDENT_AMBULATORY_CARE_PROVIDER_SITE_OTHER): Payer: Medicare Other | Admitting: Dietician

## 2021-01-12 ENCOUNTER — Encounter: Payer: Self-pay | Admitting: Dietician

## 2021-01-12 DIAGNOSIS — Z713 Dietary counseling and surveillance: Secondary | ICD-10-CM

## 2021-01-12 DIAGNOSIS — E118 Type 2 diabetes mellitus with unspecified complications: Secondary | ICD-10-CM

## 2021-01-12 DIAGNOSIS — Z6826 Body mass index (BMI) 26.0-26.9, adult: Secondary | ICD-10-CM

## 2021-01-12 DIAGNOSIS — Z794 Long term (current) use of insulin: Secondary | ICD-10-CM | POA: Diagnosis not present

## 2021-01-12 NOTE — Patient Instructions (Signed)
Please REDUCE YOUR LANTUS TO 8 UNITS STARTING TONIGHT TO HELP DECREASE YOUR LOW BLOOD SUGARS.  Record the time, amount and what food drinks and activities you have while wearing the continuous glucose monitor (CGM).  Bring the folder with you to follow up appointments. If your monitor falls off, please place it in the bag provided in your folder and bring it back with you to your next appointment.   Do not have a CT or an MRI while wearing the CGM.   1 week visit has been set up with me and a doctor for the first of two CGM downloads.   You will also return in 2 weeks to have your second download and the CGM removed.  Amanda [name], RD 01/12/2021 2:59 PM

## 2021-01-12 NOTE — Progress Notes (Signed)
   Medical Nutrition Therapy:  Appt start time: 1415 end time:  1500 time: 45 minutes Visit # 3 Last visit was 12/21/18  Assessment:  Primary concerns today: CGM per Dr. Myrtie Hawk. Amanda [name] presents for CGM placement today. We discussed her medications, rate of hypoglycemia( sevral weekly with no symptoms most ly fasting and severl aper month with symptoms. She would lie a pharmacy rferrla if she needs another medication to help her control her blood sugars with less hypoglycemia. she states that ozempic helped her with her blood sugar control.  Her current weight is appropriate for her height and age,  DM diagnosis: 59 with history of DKA  Preferred Learning Style:No preference indicated  Learning Readiness: Ready ANTHROPOMETRICS: Estimated body mass index is 26.53 kg/m as calculated from the following:   Height as of 12/30/20: 5\' 1"  (1.549 m).   Weight as of this encounter: 140 lb 6.4 oz (63.7 kg). Wt Readings from Last 10 Encounters:  01/12/21 140 lb 6.4 oz (63.7 kg)  12/30/20 141 lb (64 kg)  10/28/20 129 lb 4.8 oz (58.7 kg)  10/12/20 119 lb 12.8 oz (54.3 kg)  09/14/20 121 lb 1.6 oz (54.9 kg)  07/03/20 123 lb 9.6 oz (56.1 kg)  06/10/20 119 lb 2 oz (54 kg)  03/23/20 116 lb 1.6 oz (52.7 kg)  12/24/19 113 lb 8 oz (51.5 kg)  11/11/19 116 lb 8 oz (52.8 kg)    MEDICATIONS: she reports 10 units Lantus daily and metformin 1000 mg two times a day and rarely misses either  BLOOD SUGAR: METER DOWNLOAD  Report summary is from last 30 days,  Average tests per day: 1.1 Average blood glucose: 93 Range: minimum: 46 and maximum: 210 Days without test: 0 % hypoglycemia: 11/33 ~30% Notes about patterns: all but one check was fasting  DIETARY INTAKE: eating normally, note in chart that per GI her bowels are alternating between constipation and diarrhea. Gi mentions diabetic gastropathy. Usual physical activity: no changes in activities of daily living  Progress Towards Goal(s):  In  progress.   Nutritional Diagnosis:  NB-1.1 Food and nutrition-related knowledge deficit As related to  lack of sufficient training to build self confidence in diabetes self care  As evidenced by her report.    Intervention:  Nutrition education  Action Goal:keep food and activity records   Outcome goal: improved blood sugars Coordination of care: her blood sugars were discussed with Drs Marianna Payment and Evette Doffing. It was agreed that her lantus hould be decreased by 2 units starting tonight. Consider change her to tresiba vs xultophy    Teaching Method Utilized: Visual, Auditory,Hands on Handouts given during visit include:cgm and  After visit summary Barriers to learning/adherence to lifestyle change: competing values Demonstrated degree of understanding via:  Teach Back   Monitoring/Evaluation:  Dietary intake, exercise, meter and cgm, and body weight in 1 week(s). Amanda [name], RD 01/12/2021 5:48 PM.  Documentation for Freestyle Libre Pro Continuous glucose monitoring Freestyle Libre Pro CGM sensor placed today. Patient was educated about wearing sensor, keeping food, activity and medication log and when to call office. Patient was educated about how to care for the sensor and not to have an MRI, CT or Diathermy while wearing the sensor. Follow up was arranged with the patient for 1 week.   Lot #: [id number] Serial #: [id number] Expiration Date: 04/17/21  Amanda [name], RD 01/12/2021 2:58 PM.

## 2021-01-18 NOTE — Progress Notes (Signed)
Internal Medicine Clinic Attending  CCM services provided by the care management provider and their documentation were reviewed with Dr. Marva Panda.  We reviewed the pertinent findings, urgent action items addressed by the resident and non-urgent items to be addressed by the PCP.  I agree with the assessment, diagnosis, and plan of care documented in the CCM and resident's note.  Oda Kilts, MD 01/18/2021

## 2021-01-20 ENCOUNTER — Other Ambulatory Visit: Payer: Self-pay | Admitting: Student

## 2021-01-20 ENCOUNTER — Encounter: Payer: Self-pay | Admitting: Student

## 2021-01-20 ENCOUNTER — Ambulatory Visit (INDEPENDENT_AMBULATORY_CARE_PROVIDER_SITE_OTHER): Payer: Medicare Other | Admitting: Student

## 2021-01-20 ENCOUNTER — Ambulatory Visit (INDEPENDENT_AMBULATORY_CARE_PROVIDER_SITE_OTHER): Payer: Medicare Other | Admitting: Dietician

## 2021-01-20 ENCOUNTER — Encounter: Payer: Self-pay | Admitting: Dietician

## 2021-01-20 ENCOUNTER — Other Ambulatory Visit: Payer: Self-pay

## 2021-01-20 DIAGNOSIS — E113513 Type 2 diabetes mellitus with proliferative diabetic retinopathy with macular edema, bilateral: Secondary | ICD-10-CM

## 2021-01-20 DIAGNOSIS — E118 Type 2 diabetes mellitus with unspecified complications: Secondary | ICD-10-CM

## 2021-01-20 DIAGNOSIS — J302 Other seasonal allergic rhinitis: Secondary | ICD-10-CM | POA: Diagnosis not present

## 2021-01-20 DIAGNOSIS — I10 Essential (primary) hypertension: Secondary | ICD-10-CM | POA: Diagnosis not present

## 2021-01-20 DIAGNOSIS — Z794 Long term (current) use of insulin: Secondary | ICD-10-CM

## 2021-01-20 DIAGNOSIS — Z6826 Body mass index (BMI) 26.0-26.9, adult: Secondary | ICD-10-CM

## 2021-01-20 DIAGNOSIS — K529 Noninfective gastroenteritis and colitis, unspecified: Secondary | ICD-10-CM

## 2021-01-20 DIAGNOSIS — Z713 Dietary counseling and surveillance: Secondary | ICD-10-CM

## 2021-01-20 DIAGNOSIS — R053 Chronic cough: Secondary | ICD-10-CM | POA: Insufficient documentation

## 2021-01-20 MED ORDER — INSULIN DEGLUDEC 100 UNIT/ML ~~LOC~~ SOPN: 6.0000 [IU] | PEN_INJECTOR | Freq: Every day | SUBCUTANEOUS | 2 refills | Status: DC

## 2021-01-20 MED ORDER — FLUTICASONE PROPIONATE 50 MCG/ACT NA SUSP: 2.0000 | Freq: Every day | NASAL | 2 refills | Status: DC

## 2021-01-20 MED ORDER — LISINOPRIL 10 MG PO TABS: 10.0000 mg | ORAL_TABLET | Freq: Every day | ORAL | 3 refills | Status: DC

## 2021-01-20 MED ORDER — DEXCOM G6 TRANSMITTER MISC: 3 refills | Status: DC

## 2021-01-20 MED ORDER — DEXCOM G6 RECEIVER DEVI: 0 refills | Status: DC

## 2021-01-20 MED ORDER — DEXCOM G6 SENSOR MISC: 3 refills | Status: DC

## 2021-01-20 MED ORDER — METFORMIN HCL 1000 MG PO TABS: 1000.0000 mg | ORAL_TABLET | Freq: Two times a day (BID) | ORAL | 2 refills | Status: DC

## 2021-01-20 MED FILL — FLUTICASONE PROP 50 MCG SPR: 50 | 30 days supply | Qty: 16 | Fill #0

## 2021-01-20 MED FILL — TRESIBA FLEXTOUCH 100 UNITS: 100 | 50 days supply | Qty: 3 | Fill #0

## 2021-01-20 NOTE — Assessment & Plan Note (Signed)
Patient complaining of chronic cough for >1 month. Reports that it is a dry cough and she does experience some associated congestion and ear discomfort at times. She also endorses occasional post-nasal drip. On exam, tympanic membrane and external ear canal are normal. She asked for possible evaluation by ENT, but we discussed trying conservative measures first. Discussed using a Neti Pot, humidifier, and flonase to see if these help with her symptoms (possible irritation due to dry air during wintertime vs allergies). If her symptoms persist, then can reconsider ENT evaluation.  Plan: -supportive measures (Neti Pot, humidifier, flonase) -consider ENT evaluation if symptoms persist

## 2021-01-20 NOTE — Patient Instructions (Signed)
Ms Toppins,  It was a pleasure seeing you in the clinic today.   We discussed the following today:  1. We will be changing your lantus to [place]. Please take Tresiba 6 units daily.  2. For your dry cough, please try using a Neti Pot and a humidifier as well. Also, please use flonase as directed.  3. Please use a food diary to see what foods are causing your GI symptoms.  Please call our clinic at [phone] if you have any questions or concerns. The best time to call is Monday-Friday from 9am-4pm, but there is someone available 24/7 at the same number. If you need medication refills, please notify your pharmacy one week in advance and they will send [place] a request.   Thank you for letting us take part in your care. We look forward to seeing you next time!

## 2021-01-20 NOTE — Patient Instructions (Addendum)
Today we talked about- your blood sugar control and that keeping carbohydrates amount consistent across the day may help diarrhea by decreasing blood glucose fluctuations. It should definitely help your blood sugars and you to feel better.   If it does not help- we can talk about a Low FODMAP diet to see if that helps. (A trial of no gluten, lactose, high fructose sugar and other sugars that can cause intestinal distress.    Please make an appointment to follow up with me next week.   Butch Penny [phone]

## 2021-01-20 NOTE — Progress Notes (Signed)
Medical Nutrition Therapy:  Appt start time: 1027 end time: 1415 time: 60 minutes Visit # 4  Assessment:  Primary concerns today: CGM download 1, her meter is not working, she wants to discuss what she can do to alleviate her chronic diarrhea and also wants to find out how to get a personal CGM. Her CGM showed continued hypoglycemia overnight with rising glucose over the daytime. She did not bring her food records, but say she does most of her eating(carbs) in the evening and later in the day. She is vague about what foods contain carbs and is not following a carb controlled meal plan which may help the high variability and high blood sugars but not the low blood sugars. She tries to eat healthy. She ordered a new meter from [employer] and will bring it next week to have [place] order the correct supplies.   ANTHROPOMETRICS: Estimated body mass index is 26.62 kg/m as calculated from the following:   Height as of 12/30/20: 5\' 1"  (1.549 m).   Weight as of this encounter: 140 lb 14.4 oz (63.9 kg).  MEDICATIONS: she reports 10 units Lantus daily on two days and 8 units on 5 days metformin 1000 mg two times a day  BLOOD SUGAR:  CGM Results from download:   % Time CGM active:   100 %   (Goal >70%)  Average glucose:   124 mg/dL for 9 days  Glucose management indicator:   6.3 %  Time in range (70-180 mg/dL):   67 %   (Goal >70%)  Time High (181-250 mg/dL):   15 %   (Goal < 25%)  Time Very High (>250 mg/dL):    1 %   (Goal < 5%)  Time Low (54-69 mg/dL):   4 %   (Goal <4%)  Time Very Low (<54 mg/dL):   13 %   (Goal <1%)  Coefficient of variation:   43.2 %   (Goal <36%)   Very low blood sugars are too frequent and variability is too high   DIETARY INTAKE: eats 3 meal and day with a snack midday.  Sh complains of diarrhea very soon after eating. Gi mentions diabetic gastropathy, unsure if GI work up was complete Breakfast: 1 egg, toast, water and orange juice before to treat her low blood sugar  Lunch  [place] sandwich, water or sweet tea or lemonade or regular soda Dinner:  Usual physical activity: no changes in activities of daily living  Progress Towards Goal(s):  In progress.   Nutritional Diagnosis:  NB-1.1 Food and nutrition-related knowledge deficit As related to  lack of sufficient training in diabetes self care top se;f manage her disease and to build self confidence  As evidenced by her report.    Intervention:  Nutrition education about carb consistent meal plan, how to interpret the CGM download, risk of hypoglycemia, began discussion about FODMAP diet and personal CGM Action Goal: continue to keep food diary and brign it with her next week   Outcome goal: improved blood sugars Coordination of care: her blood sugars were discussed with Dr. Allyson Sabal along with her concerns about personal CGM and diarrhea.     Teaching Method Utilized: Visual, Auditory,Hands on Handouts given during visit include:cgm and  After visit summary Barriers to learning/adherence to lifestyle change: competing values Demonstrated degree of understanding via:  Teach Back   Monitoring/Evaluation:  Dietary intake, exercise, meter and cgm, and body weight in 1 week(s). [name], RD 01/20/2021 3:49 PM.

## 2021-01-20 NOTE — Assessment & Plan Note (Signed)
Patient continues to have chronic non-bloody diarrhea. She reports that she saw GI last year with negative workup. She thought it was due to her metformin, but she has had periods of up to 1 month without any diarrheal episodes so unsure if metformin is the culprit. I advised her to make notes in her food diary about which foods precipitate her symptoms (dairy products, gluten, etc.). She agrees with plan. After patient left, I noticed that her GI workup from March 2021 showed mildly elevated TTG Ab so she could have Celiac's disease. Attempted to call patient, but no answer and no voicemail set up. Will need to discuss this at next visit. May benefit from gluten-free diet (which I did instruct her to try during our encounter).  Plan: -Try gluten-free diet (will convey this with patient at next visit - she is scheduled to see Butch Penny in 1 week so will try to educate patient at that time if I am unable to reach before that visit)

## 2021-01-20 NOTE — Assessment & Plan Note (Signed)
Patient with history of T2DM on metformin 1068m BID and lantus 10 units daily. Her dose was lowered by Dr. CMarianna Paymentduring previous visit to lantus 5 units daily. However, she reports being instructed by another physician to take 10 units daily again so she switched back. Her CGM is showing 4% lows and 13% very low. She met with DButch Pennyprior to my visit and they discussed options on how to avoid lows. At this time, we will continue with the metformin 10058mBID. However, we will switch patient from lantus to [place] units daily to see if this will enable her to have stable glucose levels throughout the day. She is also interested in obtaining a DexCom CGM device, so will place order for that.   -refilled metformin 10004mID -ordered tresiba 6 units daily -DexCom for CGM -f/u visit with DonButch Penny 1 week for reevaluation

## 2021-01-20 NOTE — Progress Notes (Signed)
   CC: follow up for diabetes  HPI:  [name] is a 70 y.o. female with history of T2DM, HLD, and HTN presenting to the [facility] for follow up of diabetes. Please see individualized A&P for full HPI.  Past Medical History:  Diagnosis Date  . Allergy    SEASONAL  . Anemia   . Arthritis   . Cataract    PRESENT IN LEFT/ CATARACTS REMOVED FROM RIGHT  . Diabetes mellitus 1996  . DKA (diabetic ketoacidoses) 01/02/2013  . Hyperlipidemia   . Hypertension   . Hypotension   . Influenza A (H1N1) 01/03/2013  . Neuromuscular disorder ([place])    NEUROPATHY  . SYNCOPE 05/10/2010   Qualifier: Diagnosis of  By: Marilynne Halsted, RN, BSN, Jacquelyn     Review of Systems:  Negative aside from that listed in individualized A&P.  Physical Exam:  Vitals:   01/20/21 1444  BP: (!) 109/53  Pulse: 66  Temp: 98.2 F (36.8 C)  TempSrc: Oral  SpO2: 100%  Weight: 140 lb 14.4 oz (63.9 kg)  Height: 5\' 2"  (1.575 m)   Physical Exam Constitutional:      Appearance: Normal appearance. She is not ill-appearing.  HENT:     Head: Normocephalic and atraumatic.     Right Ear: Tympanic membrane, ear canal and external ear normal.     Left Ear: Tympanic membrane, ear canal and external ear normal.     Nose: Nose normal. No congestion.     Mouth/Throat:     Mouth: Mucous membranes are moist.     Pharynx: Oropharynx is clear. No oropharyngeal exudate.  Eyes:     Extraocular Movements: Extraocular movements intact.     Conjunctiva/sclera: Conjunctivae normal.     Pupils: Pupils are equal, round, and reactive to light.  Cardiovascular:     Rate and Rhythm: Normal rate and regular rhythm.     Pulses: Normal pulses.     Heart sounds: Normal heart sounds. No murmur heard. No friction rub. No gallop.   Pulmonary:     Effort: Pulmonary effort is normal.     Breath sounds: Normal breath sounds. No wheezing, rhonchi or rales.  Abdominal:     General: Bowel sounds are normal. There is no distension.     Palpations:  Abdomen is soft.     Tenderness: There is no abdominal tenderness.  Musculoskeletal:        General: No swelling. Normal range of motion.     Cervical back: Normal range of motion.  Skin:    General: Skin is warm and dry.     Capillary Refill: Capillary refill takes less than 2 seconds.  Neurological:     General: No focal deficit present.     Mental Status: She is alert and oriented to person, place, and time.  Psychiatric:        Mood and Affect: Mood normal.        Behavior: Behavior normal.        Thought Content: Thought content normal.      Assessment & Plan:   See Encounters Tab for problem based charting.  Patient discussed with Dr. Jimmye Norman

## 2021-01-22 ENCOUNTER — Encounter: Payer: Self-pay | Admitting: *Deleted

## 2021-01-22 NOTE — Progress Notes (Unsigned)

## 2021-01-23 ENCOUNTER — Encounter: Payer: Self-pay | Admitting: Student

## 2021-01-23 NOTE — Progress Notes (Signed)
Things That May Be Affecting Your Health:  Alcohol  Hearing loss  Pain    Depression  Home Safety  Sexual Health  x Diabetes  Lack of physical activity  Stress   Difficulty with daily activities  Loneliness  Tiredness   Drug use x Medicines  Tobacco use   Falls  Motor Vehicle Safety  Weight   Food choices  Oral Health  Other    YOUR PERSONALIZED HEALTH PLAN : 1. Schedule your next subsequent Medicare Wellness visit in one year 2. Attend all of your regular appointments to address your medical issues 3. Complete the preventative screenings and services   Annual Wellness Visit   Medicare Covered Preventative Screenings and [place] Men and Women Who How Often Need? Date of Last Service Action  Abdominal Aortic Aneurysm Adults with AAA risk factors Once     Alcohol Misuse and Counseling All Adults Screening once a year if no alcohol misuse. Counseling up to 4 face to face sessions.     Bone Density Measurement  Adults at risk for osteoporosis Once every 2 yrs     Lipid Panel Z13.6 All adults without CV disease Once every 5 yrs     Colorectal Cancer   Stool sample or  Colonoscopy All adults 75 and older   Once every year  Every 10 years     Depression All Adults Once a year Yes Today   Diabetes Screening Blood glucose, post glucose load, or GTT Z13.1  All adults at risk  Pre-diabetics  Once per year  Twice per year     Diabetes  Self-Management Training All adults Diabetics 10 hrs first year; 2 hours subsequent years. Requires Copay Yes    Glaucoma  Diabetics  Family history of glaucoma  African Americans 56 yrs +  Hispanic Americans 70 yrs + Annually - requires coppay     Hepatitis C Z72.89 or F19.20  High Risk for HCV  Born between 1945 and 1965  Annually  Once     HIV Z11.4 All adults based on risk  Annually btw ages 74 & 71 regardless of risk  Annually > 65 yrs if at increased risk     Lung Cancer Screening Asymptomatic adults  aged 68-77 with 30 pack yr history and current smoker OR quit within the last 15 yrs Annually Must have counseling and shared decision making documentation before first screen     Medical Nutrition Therapy Adults with   Diabetes  Renal disease  Kidney transplant within past 3 yrs 3 hours first year; 2 hours subsequent years     Obesity and Counseling All adults Screening once a year Counseling if BMI 30 or higher  Today   Tobacco Use Counseling Adults who use tobacco  Up to 8 visits in one year     Vaccines Z23  Hepatitis B  Influenza   Pneumonia  Adults   Once  Once every flu season  Two different vaccines separated by one year     Next Annual Wellness Visit People with Medicare Every year Yes Today     Services & Screenings Women Who How Often Need  Date of Last Service Action  Mammogram  Z12.31 Women over 20 One baseline ages 72-39. Annually ager 40 yrs+     Pap tests All women Annually if high risk. Every 2 yrs for normal risk women     Screening for cervical cancer with   Pap (Z01.419 nl or Z01.411abnl) &  HPV Z11.51 Women aged 51 to 43 Once every 5 yrs     Screening pelvic and breast exams All women Annually if high risk. Every 2 yrs for normal risk women     Sexually Transmitted Diseases  Chlamydia  Gonorrhea  Syphilis All at risk adults Annually for non pregnant females at increased risk         [place] Men Who How Ofter Need  Date of Last Service Action  Prostate Cancer - DRE & PSA Men over 50 Annually.  DRE might require a copay.     Sexually Transmitted Diseases  Syphilis All at risk adults Annually for men at increased risk

## 2021-01-25 MED FILL — CHLORTHALIDONE 25 MG TABS: 25 | 30 days supply | Qty: 30 | Fill #2

## 2021-01-25 NOTE — Progress Notes (Signed)
Internal Medicine Clinic Attending  Case discussed with Dr. Jinwala  At the time of the visit.  We reviewed the resident's history and exam and pertinent patient test results.  I agree with the assessment, diagnosis, and plan of care documented in the resident's note.  

## 2021-01-27 ENCOUNTER — Encounter: Payer: Self-pay | Admitting: Dietician

## 2021-01-27 ENCOUNTER — Other Ambulatory Visit: Payer: Self-pay | Admitting: Dietician

## 2021-01-27 ENCOUNTER — Encounter: Payer: Medicare Other | Admitting: Dietician

## 2021-01-27 ENCOUNTER — Ambulatory Visit (INDEPENDENT_AMBULATORY_CARE_PROVIDER_SITE_OTHER): Payer: Medicare Other | Admitting: Dietician

## 2021-01-27 ENCOUNTER — Other Ambulatory Visit: Payer: Self-pay | Admitting: Student

## 2021-01-27 DIAGNOSIS — Z794 Long term (current) use of insulin: Secondary | ICD-10-CM

## 2021-01-27 DIAGNOSIS — E113513 Type 2 diabetes mellitus with proliferative diabetic retinopathy with macular edema, bilateral: Secondary | ICD-10-CM | POA: Diagnosis not present

## 2021-01-27 DIAGNOSIS — E118 Type 2 diabetes mellitus with unspecified complications: Secondary | ICD-10-CM

## 2021-01-27 DIAGNOSIS — Z713 Dietary counseling and surveillance: Secondary | ICD-10-CM

## 2021-01-27 MED ORDER — ACCU-CHEK GUIDE VI STRP: ORAL_STRIP | 3 refills | Status: DC

## 2021-01-27 MED ORDER — ACCU-CHEK SOFTCLIX LANCETS MISC: 3 refills | Status: DC

## 2021-01-27 MED FILL — ACCU-CHEK SOFTCLIX LANCETS: 66 days supply | Qty: 200 | Fill #0

## 2021-01-27 NOTE — Progress Notes (Signed)
Medical Nutrition Therapy:  Appt start time: 1045 end time: 1200 time: 75 minutes( 15 minutes on CGM, 60 minutes on Medical Nutrition Therapy  Visit # 5  Assessment:  Primary concerns today: CGM download 2 for 15 days, removal of sensor, she  brought her new meter, she wants to discuss her chronic diarrhea and also wants to find out how to get a personal CGM. Her CGM showed less hypoglycemia and continued peaks after meals.  She did not bring her food records and says she thinks fried foods are what might be causing her diarrhea. Adding Snacks and or  rapid acting insulin as needed wree discussed to address the variability and high blood sugars. She tries to eat healthy. She was educated on using her Accu chek guideme meter. Blood sugar after a banana for breakfast was 134. Will request the correct supplies.   ANTHROPOMETRICS: Estimated body mass index is 25.62 kg/m as calculated from the following:   Height as of 01/20/21: 5\' 2"  (1.575 m).   Weight as of this encounter: 140 lb 1.6 oz (63.5 kg).  MEDICATIONS: tresiba 6 units daily,(cost was 60$- 2x what lantus was)  metformin 1000 mg two times a day  BLOOD SUGAR:  CGM Results from download:  week 1  week 2  % Time CGM active:   100 %   (Goal >70%)  100 %  Average glucose:   124 mg/dL for 9 days  132 mg/dl for 15 days  Glucose management indicator:   6.3 %  6.5 %  Time in range (70-180 mg/dL):   67 %   (Goal >70%)   69%  Time High (181-250 mg/dL):   15 %   (Goal < 25%)  19%  Time Very High (>250 mg/dL):    1 %   (Goal < 5%)  1 %  Time Low (54-69 mg/dL):   4 %   (Goal <4%)   4%  Time Very Low (<54 mg/dL):   13 %   (Goal <1%)   7% ( much improved)  Coefficient of variation:   43.2 %   (Goal <36%)     DIETARY INTAKE: eats 3 meal and day with a snack midday.  Sh complains of diarrhea very soon after eating. Gi mentions diabetic gastropathy, unsure if GI work up was complete Breakfast: 1 egg, toast, water and orange juice, frosted flakes, fruit  loops, cornflakes, milk, otameal  Lunch [place] sandwich, grilled cheese or garlic bread, water or sweet tea or lemonade or regular soda Dinner: a meat, 2 vegetables(broccoli and cauliflower) salad with blue cheese and ranch Snacks- girl scout cookies,  oatmeal cake, peanut butter crackers, fruit- apple, grapes Usual physical activity: no changes in activities of daily living  Progress Towards Goal(s):  Some progress.   Nutritional Diagnosis:  NB-1.1 Food and nutrition-related knowledge deficit As related to  lack of sufficient training in diabetes self care top se;f manage her disease and to build self confidence  As evidenced by her report.    Intervention:  Nutrition education about carb consistent meal plan, how to interpret the CGM download, risk of hypoglycemia, discussion about FODMAP diet and personal CGM, injecting in arm or thighs,  Action Goal: continue to keep food diary and bring it with her next week, try to omit lactose and begin less gluten   Outcome goal: improved blood sugars Coordination of care: request supplies for new meter, start process for CGM  Teaching Method Utilized: Visual, Auditory,Hands on Handouts given during visit  include:cgm and  After visit summary Barriers to learning/adherence to lifestyle change: competing values Demonstrated degree of understanding via:  Teach Back   Monitoring/Evaluation:  Dietary intake, exercise, meter and cgm, and body weight in 1 week(s). [name], RD 01/27/2021 12:31 PM.

## 2021-01-27 NOTE — Patient Instructions (Addendum)
.  Goals for the next week-   1- check blood sugars and bring meter 2- avoid lactose( milk sugar) by using only low lactose milk and limit cheese to 1 ounce hard cheese per day 3- keep a food/ symptom diary  See you next week-  Butch Penny [phone]

## 2021-01-27 NOTE — Telephone Encounter (Signed)
Request testing supplies for new meter

## 2021-02-02 ENCOUNTER — Ambulatory Visit: Payer: Medicare Other | Admitting: *Deleted

## 2021-02-02 DIAGNOSIS — I10 Essential (primary) hypertension: Secondary | ICD-10-CM

## 2021-02-02 DIAGNOSIS — N1831 Chronic kidney disease, stage 3a: Secondary | ICD-10-CM

## 2021-02-02 DIAGNOSIS — Z794 Long term (current) use of insulin: Secondary | ICD-10-CM

## 2021-02-02 DIAGNOSIS — F331 Major depressive disorder, recurrent, moderate: Secondary | ICD-10-CM

## 2021-02-02 DIAGNOSIS — E113513 Type 2 diabetes mellitus with proliferative diabetic retinopathy with macular edema, bilateral: Secondary | ICD-10-CM

## 2021-02-02 DIAGNOSIS — E782 Mixed hyperlipidemia: Secondary | ICD-10-CM

## 2021-02-02 MED FILL — ACCU-CHEK GUIDE TEST STRIP: 83 days supply | Qty: 250 | Fill #0

## 2021-02-02 NOTE — Patient Instructions (Signed)
Visit Information It was nice speaking with you today. PATIENT GOALS: Patient Care Plan: CCM RN- Wellness (Adult)- Chronic Disease states of IDDM, HLD, HTN, CKD and Depression    Problem Identified: Chronic Disease States of IDDM, HTN, HLD, CKD Depression   Priority: High    Long-Range Goal: All chronic disease states meet treatment goals   Start Date: 04/08/2020  This Visit's Progress: On track  Priority: High  Note:   CARE PLAN ENTRY (see longitudinal plan of care for additional care plan information)  Current Barriers:  . Chronic Disease Management support, education, and care coordination needs related to HTN, HLD, DMII, and CKD - spoke with patient via phone, she states she had to cancel her appointment with [name] clinic RD and [facility] tomorrow because she has offered to drive her sister to OP dialysis because she was recently diagnosed with Covid and must go to dialysis in the evening until she no longer tests positive for the virus, patient states she is very hopeful she can get a personal CGM as she gained much information about how to better her blood sugar when she was wearing the professional CGM from 1/25-01/27/21, and meeting with Butch Penny to discuss trends and self management strategies  . Clinical Goal(s) related to HTN, HLD, DMII, CKD and depression. Over the next 30- 60 days, patient will:  . Work with the care management team to address educational, disease management, and care coordination needs  . Begin or continue self health monitoring activities as directed today Measure and record CBG (blood glucose) 1-2 times daily and Measure and record blood pressure 5-7 times per week . Call provider office for new or worsened signs and symptoms Blood glucose findings outside established parameters, Blood pressure findings outside established parameters, and New or worsened symptom related to HTN, HLD, DMII, and chronic diarrhea . Call care management team with questions or  concerns . Verbalize basic understanding of patient centered plan of care established today  Interventions related to HTN, HLD, DMII, and CKD. [name] Appropriate assessments completed  . Inter-disciplinary care team collaboration (see longitudinal plan of care) . Evaluation of current treatment plan related to HTN, HLD , IDDM, depression and patient's adherence to plan as established by provider. . Reviewed Alanson Aly , RD and [facility],  clinic note of 01/27/21 . Reviewed medications and discussed medication taking behavior . Discussed plans with patient for ongoing care management follow up and provided patient with direct contact information for care management team . Reviewed upcoming appointments including with [name] on 02/10/21  Patient Self Care Activities related to HTN, HLD, DMII, CKD and depression. . Patient is unable to independently self-manage chronic health conditions . - check blood pressure daily . - choose a place to take my blood pressure (home, clinic or office, retail store) . - write blood pressure results in a log or diary . - check blood sugar at prescribed times . - check blood sugar if I feel it is too high or too low . - take the blood sugar meter to all doctor visits . - check out the ADA "Ask The Experts" podcast series . - ask family or friend for a ride . - call to cancel if needed . - keep a calendar with appointment dates      The patient verbalized understanding of instructions, educational materials, and care plan provided today and declined offer to receive copy of patient instructions, educational materials, and care plan.   The care management team will  reach out to the patient again over the next 30-60 days.   [name] RN, CCM, [facility] RN Care Manager [phone]

## 2021-02-02 NOTE — Progress Notes (Signed)
Internal Medicine Clinic Resident  I have personally reviewed this encounter including the documentation in this note and/or discussed this patient with the care management provider. I will address any urgent items identified by the care management provider and will communicate my actions to the patient's PCP. I have reviewed the patient's CCM visit with my supervising attending, Dr Williams.  Albina Gosney K Dennis Killilea, MD 02/02/2021   

## 2021-02-02 NOTE — Chronic Care Management (AMB) (Signed)
Chronic Care Management   CCM RN Visit Note  02/02/2021 Name: Amanda [name] MRN: [id number] DOB: 10/18/1951  Subjective: Amanda [name] is a 70 y.o. year old female who is a primary care patient of Cato Mulligan, MD. The care management team was consulted for assistance with disease management and care coordination needs.    Engaged with patient by telephone for follow up visit in response to provider referral for case management and/or care coordination services.   Consent to Services:  The patient was given information about Chronic Care Management services, agreed to services, and gave verbal consent prior to initiation of services.  Please see initial visit note for detailed documentation.   Patient agreed to services and verbal consent obtained.   Assessment: Review of patient past medical history, allergies, medications, health status, including review of consultants reports, laboratory and other test data, was performed as part of comprehensive evaluation and provision of chronic care management services.   SDOH (Social Determinants of Health) assessments and interventions performed:    CCM Care Plan  Allergies  Allergen Reactions  . Gabapentin Other (See Comments)    Pt states medication made her feel "out of it".   Pt states medication made her feel "out of it".      Outpatient Encounter Medications as of 02/02/2021  Medication Sig Note  . ACCU-CHEK AVIVA PLUS test strip USE AS DIRECTED UP TO FOUR TIMES DAILY   . Accu-Chek Softclix Lancets lancets USE AS DIRECTED UP TO 3 TIMES DAILY   . acetaminophen (TYLENOL) 325 MG tablet Take 2 tablets (650 mg total) by mouth every 6 (six) hours as needed (or Fever >/= 101).   [name] amLODipine (NORVASC) 10 MG tablet Take 1 tablet (10 mg total) by mouth daily.   [name] atorvastatin (LIPITOR) 40 MG tablet Take 1 tablet (40 mg total) by mouth daily at 6 PM.   . blood glucose meter kit and supplies KIT Dispense based on patient and insurance  preference. Use up to four times daily as directed. .   . cetirizine (ZYRTEC) 5 MG tablet Take 1 tablet (5 mg total) by mouth daily.   . chlorthalidone (HYGROTON) 25 MG tablet Take 1 tablet (25 mg total) by mouth daily.   . Cholecalciferol (VITAMIN D3) 2000 units capsule Take by mouth daily.    . Continuous Blood Gluc Receiver (DEXCOM G6 RECEIVER) DEVI Check blood sugar at least 6 times a day   . Continuous Blood Gluc Sensor (DEXCOM G6 SENSOR) MISC Check blood sugar at least 6 times a day   . Continuous Blood Gluc Transmit (DEXCOM G6 TRANSMITTER) MISC Check blood sugar at least 6 times a day   . diclofenac Sodium (VOLTAREN) 1 % GEL Apply topically. 04/08/2020: Uses prn  . Ferrous Sulfate (IRON) 325 (65 Fe) MG TABS Take 1 tablet (325 mg total) by mouth daily.   . fluticasone (FLONASE) 50 MCG/ACT nasal spray Place 2 sprays into both nostrils daily.   [name] glucose blood (ACCU-CHEK GUIDE) test strip Check blood sugar up to 3 times per day   . insulin degludec (TRESIBA) 100 UNIT/ML FlexTouch Pen Inject 6 Units into the skin daily.   . Insulin Pen Needle 32G X 4 MM MISC 30 each by Does not apply route daily.   [name] latanoprost (XALATAN) 0.005 % ophthalmic solution 1 drop at bedtime. 09/10/2019: LF 6.4.20 #2.5/30  . lisinopril (ZESTRIL) 10 MG tablet Take 1 tablet (10 mg total) by mouth daily.   . metFORMIN (GLUCOPHAGE) 1000  MG tablet Take 1 tablet (1,000 mg total) by mouth 2 (two) times daily with a meal.   . Multiple Vitamins-Minerals (WOMENS 50+ MULTI VITAMIN/MIN) TABS Take 1 tablet by mouth daily.    No facility-administered encounter medications on file as of 02/02/2021.    Patient Active Problem List   Diagnosis Date Noted  . Chronic cough 01/20/2021  . CKD (chronic kidney disease) stage 3, GFR 30-59 ml/min (HCC) 01/01/2021  . Chronic diarrhea 06/10/2020  . Respiratory failure ([place])   . Gait abnormality 01/10/2019  . Near syncope 11/26/2018  . Nail fungus 10/23/2017  . Cervical radiculopathy due  to degenerative joint disease of spine 05/03/2017  . 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 Pain of both shoulder joints 11/14/2016  . Orthostatic hypotension 09/15/2016  . Vitreous hemorrhage of left eye ([place]) 05/13/2016  . Osteopenia 05/04/2016  . Posterior capsular opacification of left eye, obscuring vision 02/15/2016  . MDD (major depressive disorder), recurrent episode, moderate ([place]) 02/08/2016  . Pseudophakia of both eyes 07/27/2015  . Diabetic neuropathy ([place]) 05/20/2013  . Type 2 diabetes mellitus with both eyes affected by proliferative retinopathy and macular edema, with long-term current use of insulin ([place]) 04/19/2013  . Seasonal allergies 03/12/2013  . Dyslipidemia 02/15/2013  . DM II (diabetes mellitus, type II), controlled ([place]) 02/14/2013  . Normocytic anemia 02/14/2013  . Essential hypertension 05/10/2010    Conditions to be addressed/monitored:IDDM,HLD, HTN, CKD, Depression  Care Plan : CCM RN- Wellness (Adult)- Chronic Disease states of IDDM, HLD, HTN, CKD and Depression  Updates made by Barrington Ellison, RN since 02/02/2021 12:00 AM    Problem: Chronic Disease States of IDDM, HTN, HLD, CKD Depression   Priority: High    Long-Range Goal: All chronic disease states meet treatment goals   Start Date: 04/08/2020  This Visit's Progress: On track  Priority: High  Note:   CARE PLAN ENTRY (see longitudinal plan of care for additional care plan information)  Current Barriers:  . Chronic Disease Management support, education, and care coordination needs related to HTN, HLD, DMII, and CKD - spoke with patient via phone, she states she had to cancel her appointment with [name] clinic RD and [facility] tomorrow because she has offered to drive her sister to OP dialysis because she was recently diagnosed with Covid and must go to dialysis in the evening until she no longer tests positive for the virus, patient states she is very hopeful she can get a personal CGM as she  gained much information about how to better her blood sugar when she was wearing the professional CGM from 1/25-01/27/21, and meeting with Butch Penny to discuss trends and self management strategies  . Clinical Goal(s) related to HTN, HLD, DMII, CKD and depression. Over the next 30- 60 days, patient will:  . Work with the care management team to address educational, disease management, and care coordination needs  . Begin or continue self health monitoring activities as directed today Measure and record CBG (blood glucose) 1-2 times daily and Measure and record blood pressure 5-7 times per week . Call provider office for new or worsened signs and symptoms Blood glucose findings outside established parameters, Blood pressure findings outside established parameters, and New or worsened symptom related to HTN, HLD, DMII, and chronic diarrhea . Call care management team with questions or concerns . Verbalize basic understanding of patient centered plan of care established today  Interventions related to HTN, HLD, DMII, and CKD. [name] Appropriate assessments completed  . Inter-disciplinary care  team collaboration (see longitudinal plan of care) . Evaluation of current treatment plan related to HTN, HLD , IDDM, depression and patient's adherence to plan as established by provider. . Reviewed Alanson Aly , RD and [facility],  clinic note of 01/27/21 . Reviewed medications and discussed medication taking behavior . Discussed plans with patient for ongoing care management follow up and provided patient with direct contact information for care management team . Reviewed upcoming appointments including with [name] on 02/10/21  Patient Self Care Activities related to HTN, HLD, DMII, CKD and depression. . Patient is unable to independently self-manage chronic health conditions . - check blood pressure daily . - choose a place to take my blood pressure (home, clinic or office, retail store) . - write blood pressure  results in a log or diary . - check blood sugar at prescribed times . - check blood sugar if I feel it is too high or too low . - take the blood sugar meter to all doctor visits . - check out the ADA "Ask The Experts" podcast series . - ask family or friend for a ride . - call to cancel if needed . - keep a calendar with appointment dates      Plan:The care management team will reach out to the patient again over the next 30-60 days.  [name] RN, CCM, [facility] RN Care Manager [phone]

## 2021-02-03 ENCOUNTER — Ambulatory Visit: Payer: Medicare Other | Admitting: Dietician

## 2021-02-03 ENCOUNTER — Other Ambulatory Visit: Payer: Self-pay | Admitting: Internal Medicine

## 2021-02-03 DIAGNOSIS — Z1231 Encounter for screening mammogram for malignant neoplasm of breast: Secondary | ICD-10-CM

## 2021-02-03 NOTE — Progress Notes (Signed)
THis CCM encounter was reviewed and approved with Dr. Agyei. Grateful to our CCM colleague, Janet Hauser RN. 

## 2021-02-10 ENCOUNTER — Ambulatory Visit: Payer: Medicare Other | Admitting: Dietician

## 2021-02-10 MED FILL — LISINOPRIL 10 MG TABS: 10 | 90 days supply | Qty: 90 | Fill #0

## 2021-02-10 MED FILL — AMLODIPINE BESYLATE 10 MG T: 10 | 30 days supply | Qty: 30 | Fill #1

## 2021-02-15 MED FILL — ATORVASTATIN 40 MG TABLET: 40 | 90 days supply | Qty: 90 | Fill #1

## 2021-02-19 ENCOUNTER — Encounter: Payer: Self-pay | Admitting: Podiatry

## 2021-02-19 ENCOUNTER — Ambulatory Visit: Payer: Medicare Other | Admitting: Podiatry

## 2021-02-19 ENCOUNTER — Other Ambulatory Visit: Payer: Self-pay

## 2021-02-19 DIAGNOSIS — M79674 Pain in right toe(s): Secondary | ICD-10-CM

## 2021-02-19 DIAGNOSIS — M79675 Pain in left toe(s): Secondary | ICD-10-CM

## 2021-02-19 DIAGNOSIS — M2041 Other hammer toe(s) (acquired), right foot: Secondary | ICD-10-CM

## 2021-02-19 DIAGNOSIS — B351 Tinea unguium: Secondary | ICD-10-CM | POA: Diagnosis not present

## 2021-02-19 DIAGNOSIS — L84 Corns and callosities: Secondary | ICD-10-CM

## 2021-02-19 DIAGNOSIS — M2011 Hallux valgus (acquired), right foot: Secondary | ICD-10-CM

## 2021-02-19 DIAGNOSIS — E1142 Type 2 diabetes mellitus with diabetic polyneuropathy: Secondary | ICD-10-CM

## 2021-02-19 DIAGNOSIS — M2012 Hallux valgus (acquired), left foot: Secondary | ICD-10-CM

## 2021-02-23 ENCOUNTER — Ambulatory Visit: Payer: Medicare Other | Admitting: Dietician

## 2021-02-23 ENCOUNTER — Encounter: Payer: Self-pay | Admitting: Dietician

## 2021-02-23 ENCOUNTER — Other Ambulatory Visit: Payer: Self-pay | Admitting: Dietician

## 2021-02-23 DIAGNOSIS — E113513 Type 2 diabetes mellitus with proliferative diabetic retinopathy with macular edema, bilateral: Secondary | ICD-10-CM

## 2021-02-23 LAB — HM DIABETES EYE EXAM

## 2021-02-23 NOTE — Progress Notes (Signed)
Medical Nutrition Therapy:  Appt start time: 1:20 end time: 2:30 time: 70 minutes  Visit # 6  Assessment: Patient's primary concern today was her chronic diarrhea, which she says has been less frequent lately. She says she has noticed a difference since eating less fried foods, cheese, and switching to [employer] milk occasionally. She said she definitely notices diarrhea and abdominal pain after eating yogurt, but states she keeps eating yogurt to a minimum. She had kept food records with symptoms since her last visit but forgot to bring them today. We discussed foods that contain lactose and foods that contain gluten to avoid, as well as what can be used as a replacement for these foods.    ANTHROPOMETRICS: not interested in weighing today  MEDICATIONS: tresiba 6 units daily, metformin 1000 mg two times a day  BLOOD SUGAR: When looking at her blood sugars recorded in her Accu chek meter, she had several lows ranging between 63-77 in the mornings. Otherwise her blood sugars were mostly in the 100s. Today she was more concerned about her continued GI distress rather than her blood sugars.   DIETARY INTAKE: discussed foods in her diet that might be causing her diarrhea- types of cereal, lactose free milks, wheat/barley/rye containing foods  Usual physical activity: Not discussed  Progress Towards Goal(s):  Some progress.   Nutritional Diagnosis:  NB-1.1 Food and nutrition-related knowledge deficit As related to  lack of sufficient training in diabetes self care to self manage her disease and to build self confidence  As evidenced by her report.  Food and nutrition-related knowledge deficit as related to continued intake of potential GI irritating foods as evidenced by her report of continued diarrhea and abdominal soreness    Intervention:  Nutrition education about lactose and gluten containing foods, and discussion about FODMAP diet  Action Goal: continue to keep food diary and bring it  with her next week, try to omit lactose and begin less gluten   Outcome goal: improved blood sugars, improved GI distress Coordination of care: Discuss low blood sugars with her doctor  Teaching Method Utilized: Visual, Auditory Handouts given during visit include: After visit summary, FODMAP handout  Barriers to learning/adherence to lifestyle change: competing values Demonstrated degree of understanding via:  Teach Back   Monitoring/Evaluation:  Dietary intake, GI symptoms, meter, and body weight in 4 week(s). [name], RD 02/23/2021 2:49 PM.

## 2021-02-23 NOTE — Patient Instructions (Addendum)
You can call [facility] about getting a Dexcom Continuous glucose monitor. Their Number is [phone].   Good job decreasing the lactose- I'd suggest continuing to decrease your lactose.  Lactose is in milk and milk products"  Limit Hard cheese, yogurt, use soy milk, low lactose milk( remember the [employer] store brand) little blue writing that says low lactose   Low-Gluten Eating Plan Gluten is a protein that is found in wheat, barley, rye, and triticale, a hybrid of wheat and rye. Some people have a condition that makes them unable to digest gluten. For those people, eating just a small amount of gluten can damage their intestines. This is not a gluten-free eating plan. This low-gluten eating plan is for people who feel better when they eat less gluten. What are tips for following this plan? Reading food labels  Make sure to read food labels.  Look for wheat, rye, barley, oats (unless it says "certified gluten-free"), malt, and brewer's yeast. If the food contains any of these, it has gluten in it.  Wheat-free does not mean gluten-free. Meal planning  Eat a variety of foods so you get all of the nutrients that you need.  To have more control over the ingredients in your meals, consider making food yourself instead of buying prepared foods. General information  Many gluten-free grain products are not fortified with vitamins and minerals like gluten-containing grains are. Because of this, there is a risk of nutrient deficiencies if you do not eat a balanced diet. Make sure to meet with your health care provider or a registered dietitian to review your diet.  When eating out, look for restaurants that have gluten-free options or can make substitutions to accommodate this eating plan. Consider calling a restaurant ahead of time to discuss their menu. What foods can I eat? With this eating plan, you can eat anything that is labeled "gluten-free" or that does not  contain wheat or other grains that have gluten. Fruits All fruits, such as bananas, apples, oranges, grapes, papaya, mango, pomegranate, kiwi, grapefruit, and cherries. Vegetables All vegetables that are not in a sauce that would contain gluten, such as lettuce, spinach, peas, beets, cauliflower, cabbage, broccoli, carrots, tomatoes, squash, eggplant, herbs, peppers, onions, cucumbers, Brussels sprouts, yams, and sweet potatoes. Grains Naturally gluten-free grains and flours, including rice, quinoa, corn, buckwheat, cassava, amaranth, millet, polenta or cornmeal, tapioca, flax, chia, yucca, sorghum, and teff. Corn tortillas or taco shells. Oatmeal that is labeled as "gluten-free" or "uncontaminated." Nut flours. Meats and other proteins Beef. Pork. Chicken. [place]. Fish. Eggs. Tofu. Beans. Nuts. Lentils. Dairy Milk. Ice cream. Yogurt. Cheese. Cottage cheese. Beverages Water. Coffee. Tea. Juice. Soda. Seltzer water. Distilled alcohols. Wine. Seasonings and condiments Mustard. Relish. Ketchup. Barbecue sauce. Vinegar. Mayonnaise. Tamari. Sweets and desserts Honey. Sugar. Maple syrup. Fats and oils Butter. Vegetable oil. Olive oil. Canola oil. [place] oil. Other foods Arrowroot or cornstarch. Potato flour. The items listed above may not be a complete list of foods and beverages you can eat. Contact a dietitian for more information.   What foods should I avoid? Grains Wheat. Barley. Rye. Oatmeal that is not certified gluten-free. Triticale. Bulgur. Meats and other proteins Seitan. Precooked or cured meat, such as sausages or meat loaves. Hot dogs. Salami. Beverages Beer, ale, lager, and malt beverages made from gluten-containing grains. Seasonings and condiments Malt vinegar. Salad dressing. Soy sauce. Teriyaki sauce. Marinades. Check the label of any pre-made sauces for a full list of ingredients. Sweets and desserts Licorice.  Jagiello rice syrup. Pre-made pudding or pudding mixes. Other  foods Bouillon cubes. Canned or boxed pre-made soups or soup packets. Bagged chips, such as potato chips and tortilla chips. Seasoning packets. Energy bars. Seasoned rice mixes. Processed foods. The items listed above may not be a complete list of foods and beverages to avoid. Contact a dietitian for more information. Summary  Gluten is a protein that is found in wheat, barley, rye, and triticale, a hybrid of wheat and rye.  This low-gluten eating plan is for people who feel better when they eat less gluten.  Reading food labels of packaged foods is the best way to make sure you are following a low-gluten eating plan. Look for "gluten-free" on the label.  Eat a variety of foods and colors and use whole grains that are naturally gluten-free to reduce your risk of having any nutrient deficiencies. This information is not intended to replace advice given to you by your health care provider. Make sure you discuss any questions you have with your health care provider. Document Revised: 12/10/2019 Document Reviewed: 08/08/2018 Elsevier Patient Education  2021 [employer].

## 2021-02-26 MED FILL — METFORMIN HCL 1000 MG TABS: 1000 | 30 days supply | Qty: 60 | Fill #0

## 2021-02-27 NOTE — Progress Notes (Signed)
Subjective: Amanda [name] presents today for follow up of preventative diabetic foot care and painful mycotic nails b/l that are difficult to trim. Pain interferes with ambulation. Aggravating factors include wearing enclosed shoe gear. Pain is relieved with periodic professional debridement.   She states blood sugar was 77 mg/dl this morning and 120 mg/dl before lunch.  Her PCP is Dr. Cato Mulligan and last visit was 11/23/2020.  Allergies  Allergen Reactions  . Gabapentin Other (See Comments)    Pt states medication made her feel "out of it".   Pt states medication made her feel "out of it".       Objective: There were no vitals filed for this visit.  Vascular Examination:  Capillary fill time to digits <3s b/l, palpable DP pulses b/l, palpable PT pulses b/l, pedal hair present b/l and skin temperature gradient within normal limits b/l  Dermatological Examination: Pedal skin with normal turgor, texture and tone bilaterally. Toenails 1-5 b/l elongated, discolored, dystrophic, thickened, crumbly with subungual debris and tenderness to dorsal palpation.   Hyperkeratotic lesion right 5th toe. No erythema, no edema, no drainage, no fluctuance. . Incurvated nailplate L hallux and R hallux with tenderness to palpation. No erythema, no edema, no drainage noted. No fluctuance.  Musculoskeletal: Normal muscle strength 5/5 to all lower extremity muscle groups bilaterally. No pain crepitus or joint limitation noted with ROM b/l. Hallux valgus with bunion deformity noted b/l lower extremities. Hammertoes noted to the R 5th toe.  Neurological: Pt has subjective symptoms of neuropathy. Protective sensation intact 5/5 intact bilaterally with 10g monofilament b/l. Vibratory sensation intact b/l.  Assessment: Pain due to onychomycosis of toenails of both feet  Corns  Hallux valgus, acquired, bilateral  Hammer toe of right foot  Diabetic peripheral neuropathy associated with type 2  diabetes mellitus ([place])  Plan: -Patient examined. -No new findings. No new orders. -Continue diabetic foot care principles. -Toenails 1-5 b/l were debrided in length and girth without iatrogenic bleeding. Offending nail borders debrided and curretaged b/l great toes. Borders cleansed with alcohol. Antibiotic ointment applied. No further treatment required by patient. -Patient to continue soft, supportive shoe gear daily. -Corn(s) pared right 5th digit PIPJ utilizing sterile scalpel blade without incident. -Patient to report any pedal injuries to medical professional immediately. -Patient/POA to call should there be question/concern in the interim.  Return in about 3 months (around 05/22/2021).

## 2021-03-02 ENCOUNTER — Encounter: Payer: Self-pay | Admitting: *Deleted

## 2021-03-03 ENCOUNTER — Encounter: Payer: Self-pay | Admitting: Dietician

## 2021-03-08 ENCOUNTER — Ambulatory Visit: Payer: Medicare Other | Admitting: *Deleted

## 2021-03-08 DIAGNOSIS — Z794 Long term (current) use of insulin: Secondary | ICD-10-CM

## 2021-03-08 DIAGNOSIS — E113513 Type 2 diabetes mellitus with proliferative diabetic retinopathy with macular edema, bilateral: Secondary | ICD-10-CM

## 2021-03-08 DIAGNOSIS — E782 Mixed hyperlipidemia: Secondary | ICD-10-CM

## 2021-03-08 DIAGNOSIS — N1831 Chronic kidney disease, stage 3a: Secondary | ICD-10-CM

## 2021-03-08 DIAGNOSIS — F331 Major depressive disorder, recurrent, moderate: Secondary | ICD-10-CM

## 2021-03-08 DIAGNOSIS — I10 Essential (primary) hypertension: Secondary | ICD-10-CM

## 2021-03-08 NOTE — Patient Instructions (Signed)
Visit Information It was nice speaking with you today. PATIENT GOALS: Patient Care Plan: CCM RN- Wellness (Adult)- Chronic Disease states of IDDM, HLD, HTN, CKD and Depression    Problem Identified: Chronic Disease States of IDDM, HTN, HLD, CKD Depression   Priority: High    Long-Range Goal: All chronic disease states meet treatment goals   Start Date: 04/08/2020  Recent Progress: On track  Priority: High  Note:   CARE PLAN ENTRY (see longitudinal plan of care for additional care plan information)  Current Barriers:  . Chronic Disease Management support, education, and care coordination needs related to HTN, HLD, DMII, and CKD - spoke with patient via phone, she states she is doing well except for ongoing issues with diarrhea, says she will see her gastroenterologist later this month as eliminating certain foods from her diet as suggested by [name] RD, CDCES in the clinic has not helped, she says she has never had food allergy testing,  patient states she has not obtained a  personal CGM because she is still not sure she wants to use it, she denies hypoglycemia and voices correct treatment strategies ( drinks small amount of orange juice), says the majority of her CBGs and her home BP readings are meeting treatment targets, she reports good medication taking behavior, she says she saw her eye MD earlier this month and her diabetic retinopathy has not worsened but she does need new glasses, she says she also saw her podiatrist this month and she will have shoe casting for orthotics tomorrow ( this will be her 2nd pair since she was diagnoses with DM in 1996, ) , says her sister has fully recovered for Covid and is now back on her usual dialysis schedule so patient is no longer helping with transportation for her sister to evening dialysis sessions, she says she does not remember receiving the American Diabetes Association (ADA) Ask the Experts program information and is requesting it be  re-mailed to her  . Clinical Goal(s) related to HTN, HLD, DMII, CKD and depression. Over the next 30- 60 days, patient will:  . Work with the care management team to address educational, disease management, and care coordination needs  . Begin or continue self health monitoring activities as directed today Measure and record CBG (blood glucose) 1-2 times daily and Measure and record blood pressure 5-7 times per week . Call provider office for new or worsened signs and symptoms Blood glucose findings outside established parameters, Blood pressure findings outside established parameters, and New or worsened symptom related to HTN, HLD, DMII, and chronic diarrhea . Call care management team with questions or concerns . Verbalize basic understanding of patient centered plan of care established today  Interventions related to HTN, HLD, DMII, and CKD. [name] Appropriate assessments completed  . Inter-disciplinary care team collaboration (see longitudinal plan of care) . Evaluation of current treatment plan related to HTN, HLD , IDDM, depression and patient's adherence to plan as established by provider. . Reviewed Alanson Aly , RD and CDCES,  clinic note of 02/23/21 . Reviewed medications and discussed medication taking behavior . Discussed plans with patient for ongoing care management follow up and provided patient with direct contact information for care management team . Reviewed upcoming appointments including : 0?62- for shoe casting, 03/18/21 with GI MD, with [name] on 03/23/21 . Will mail patient [facility] spiral bound day planner with health information and ADA Ask the Experts Information and questions to ask gastroenterologist about  food sensitivity testing  Patient Self Care Activities related to HTN, HLD, DMII, CKD and depression. . Patient is unable to independently self-manage chronic health conditions . - check blood pressure daily . - choose a place to  take my blood pressure (home, clinic or office, retail store) . - write blood pressure results in a log or diary . - check blood sugar at prescribed times . - check blood sugar if I feel it is too high or too low . - take the blood sugar meter to all doctor visits . - check out the ADA "Ask The Experts" podcast series . - ask family or friend for a ride . - call to cancel if needed . - keep a calendar with appointment dates      The patient verbalized understanding of instructions, educational materials, and care plan provided today and declined offer to receive copy of patient instructions, educational materials, and care plan.   Telephone follow up appointment with care management team member scheduled for:04/09/21  [name] RN, CCM, [facility] RN Care Manager [phone]

## 2021-03-08 NOTE — Chronic Care Management (AMB) (Signed)
Chronic Care Management   CCM RN Visit Note  03/08/2021 Name: Amanda [name] MRN: [id number] DOB: 09-23-1951  Subjective: Amanda [name] is a 70 y.o. year old female who is a primary care patient of Cato Mulligan, MD. The care management team was consulted for assistance with disease management and care coordination needs.    Engaged with patient by telephone for follow up visit in response to provider referral for case management and/or care coordination services.   Consent to Services:  The patient was given information about Chronic Care Management services, agreed to services, and gave verbal consent prior to initiation of services.  Please see initial visit note for detailed documentation.   Patient agreed to services and verbal consent obtained.   Assessment: Review of patient past medical history, allergies, medications, health status, including review of consultants reports, laboratory and other test data, was performed as part of comprehensive evaluation and provision of chronic care management services.   SDOH (Social Determinants of Health) assessments and interventions performed:    CCM Care Plan  Allergies  Allergen Reactions  . Gabapentin Other (See Comments)    Pt states medication made her feel "out of it".   Pt states medication made her feel "out of it".      Outpatient Encounter Medications as of 03/08/2021  Medication Sig Note  . ACCU-CHEK AVIVA PLUS test strip USE AS DIRECTED UP TO FOUR TIMES DAILY   . Accu-Chek Softclix Lancets lancets USE AS DIRECTED UP TO 3 TIMES DAILY   . acetaminophen (TYLENOL) 325 MG tablet Take 2 tablets (650 mg total) by mouth every 6 (six) hours as needed (or Fever >/= 101).   [name] amLODipine (NORVASC) 10 MG tablet Take 1 tablet (10 mg total) by mouth daily.   [name] atorvastatin (LIPITOR) 40 MG tablet Take 1 tablet (40 mg total) by mouth daily at 6 PM.   . blood glucose meter kit and supplies KIT Dispense based on patient and insurance  preference. Use up to four times daily as directed. .   . cetirizine (ZYRTEC) 5 MG tablet Take 1 tablet (5 mg total) by mouth daily.   . chlorthalidone (HYGROTON) 25 MG tablet Take 1 tablet (25 mg total) by mouth daily.   . Cholecalciferol (VITAMIN D3) 2000 units capsule Take by mouth daily.    . Continuous Blood Gluc Receiver (DEXCOM G6 RECEIVER) DEVI Check blood sugar at least 6 times a day (Patient not taking: Reported on 03/08/2021) 03/08/2021: Patient states she does not have a CGM  . Continuous Blood Gluc Sensor (DEXCOM G6 SENSOR) MISC Check blood sugar at least 6 times a day (Patient not taking: Reported on 03/08/2021) 03/08/2021: Patient states she does not have a CGM   . Continuous Blood Gluc Transmit (DEXCOM G6 TRANSMITTER) MISC Check blood sugar at least 6 times a day (Patient not taking: Reported on 03/08/2021) 03/08/2021: Patient states she does not have a CGM   . diclofenac Sodium (VOLTAREN) 1 % GEL Apply topically. 04/08/2020: Uses prn  . Ferrous Sulfate (IRON) 325 (65 Fe) MG TABS Take 1 tablet (325 mg total) by mouth daily.   . fluticasone (FLONASE) 50 MCG/ACT nasal spray Place 2 sprays into both nostrils daily.   [name] glucose blood (ACCU-CHEK GUIDE) test strip Check blood sugar up to 3 times per day   . insulin degludec (TRESIBA) 100 UNIT/ML FlexTouch Pen Inject 6 Units into the skin daily.   . Insulin Pen Needle 32G X 4 MM MISC 30 each by  Does not apply route daily.   [name] latanoprost (XALATAN) 0.005 % ophthalmic solution 1 drop at bedtime. 09/10/2019: LF 6.4.20 #2.5/30  . lisinopril (ZESTRIL) 10 MG tablet Take 1 tablet (10 mg total) by mouth daily.   . metFORMIN (GLUCOPHAGE) 1000 MG tablet Take 1 tablet (1,000 mg total) by mouth 2 (two) times daily with a meal.   . Multiple Vitamins-Minerals (WOMENS 50+ MULTI VITAMIN/MIN) TABS Take 1 tablet by mouth daily.    No facility-administered encounter medications on file as of 03/08/2021.    Patient Active Problem List   Diagnosis Date Noted  .  Chronic cough 01/20/2021  . CKD (chronic kidney disease) stage 3, GFR 30-59 ml/min (HCC) 01/01/2021  . Chronic diarrhea 06/10/2020  . Respiratory failure ([place])   . Gait abnormality 01/10/2019  . Near syncope 11/26/2018  . Nail fungus 10/23/2017  . Cervical radiculopathy due to degenerative joint disease of spine 05/03/2017  . 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 Pain of both shoulder joints 11/14/2016  . Orthostatic hypotension 09/15/2016  . Vitreous hemorrhage of left eye ([place]) 05/13/2016  . Osteopenia 05/04/2016  . Posterior capsular opacification of left eye, obscuring vision 02/15/2016  . MDD (major depressive disorder), recurrent episode, moderate ([place]) 02/08/2016  . Pseudophakia of both eyes 07/27/2015  . Diabetic neuropathy ([place]) 05/20/2013  . Type 2 diabetes mellitus with both eyes affected by proliferative retinopathy and macular edema, with long-term current use of insulin ([place]) 04/19/2013  . Seasonal allergies 03/12/2013  . Dyslipidemia 02/15/2013  . DM II (diabetes mellitus, type II), controlled ([place]) 02/14/2013  . Normocytic anemia 02/14/2013  . Essential hypertension 05/10/2010    Conditions to be addressed/monitored:IDDM,  has PDR ,HLD, HTN, CKD, Depression  Care Plan : CCM RN- Wellness (Adult)- Chronic Disease states of IDDM, HLD, HTN, CKD and Depression  Updates made by Barrington Ellison, RN since 03/08/2021 12:00 AM    Problem: Chronic Disease States of IDDM, HTN, HLD, CKD Depression   Priority: High    Long-Range Goal: All chronic disease states meet treatment goals   Start Date: 04/08/2020  Recent Progress: On track  Priority: High  Note:   CARE PLAN ENTRY (see longitudinal plan of care for additional care plan information)  Current Barriers:  . Chronic Disease Management support, education, and care coordination needs related to HTN, HLD, DMII, and CKD - spoke with patient via phone, she states she is doing well except for ongoing issues with  diarrhea, says she will see her gastroenterologist later this month as eliminating certain foods from her diet as suggested by [name] RD, CDCES in the clinic has not helped, she says she has never had food allergy testing,  patient states she has not obtained a  personal CGM because she is still not sure she wants to use it, she denies hypoglycemia and voices correct treatment strategies ( drinks small amount of orange juice), says the majority of her CBGs and her home BP readings are meeting treatment targets, she reports good medication taking behavior, she says she saw her eye MD earlier this month and her diabetic retinopathy has not worsened but she does need new glasses, she says she also saw her podiatrist this month and she will have shoe casting for orthotics tomorrow ( this will be her 2nd pair since she was diagnoses with DM in 1996, ) , says her sister has fully recovered for Covid and is now back on her usual dialysis schedule so patient is no longer helping with  transportation for her sister to evening dialysis sessions, she says she does not remember receiving the American Diabetes Association (ADA) Ask the Experts program information and is requesting it be re-mailed to her  . Clinical Goal(s) related to HTN, HLD, DMII, CKD and depression. Over the next 30- 60 days, patient will:  . Work with the care management team to address educational, disease management, and care coordination needs  . Begin or continue self health monitoring activities as directed today Measure and record CBG (blood glucose) 1-2 times daily and Measure and record blood pressure 5-7 times per week . Call provider office for new or worsened signs and symptoms Blood glucose findings outside established parameters, Blood pressure findings outside established parameters, and New or worsened symptom related to HTN, HLD, DMII, and chronic diarrhea . Call care management team with questions or concerns . Verbalize basic  understanding of patient centered plan of care established today  Interventions related to HTN, HLD, DMII, and CKD. [name] Appropriate assessments completed  . Inter-disciplinary care team collaboration (see longitudinal plan of care) . Evaluation of current treatment plan related to HTN, HLD , IDDM, depression and patient's adherence to plan as established by provider. . Reviewed Alanson Aly , RD and CDCES,  clinic note of 02/23/21 . Reviewed medications and discussed medication taking behavior . Discussed plans with patient for ongoing care management follow up and provided patient with direct contact information for care management team . Reviewed upcoming appointments including : 1?51- for shoe casting, 03/18/21 with GI MD, with [name] on 03/23/21 . Will mail patient [facility] spiral bound day planner with health information and ADA Ask the Experts Information and questions to ask gastroenterologist about food sensitivity testing  Patient Self Care Activities related to HTN, HLD, DMII, CKD and depression. . Patient is unable to independently self-manage chronic health conditions . - check blood pressure daily . - choose a place to take my blood pressure (home, clinic or office, retail store) . - write blood pressure results in a log or diary . - check blood sugar at prescribed times . - check blood sugar if I feel it is too high or too low . - take the blood sugar meter to all doctor visits . - check out the ADA "Ask The Experts" podcast series . - ask family or friend for a ride . - call to cancel if needed . - keep a calendar with appointment dates      Plan:Telephone follow up appointment with care management team member scheduled for:  04/09/21 at 9:15 am   [name] RN, CCM, [facility] RN Care Manager [phone]

## 2021-03-09 ENCOUNTER — Other Ambulatory Visit: Payer: Self-pay

## 2021-03-09 ENCOUNTER — Ambulatory Visit (INDEPENDENT_AMBULATORY_CARE_PROVIDER_SITE_OTHER): Payer: Medicare Other | Admitting: *Deleted

## 2021-03-09 DIAGNOSIS — M2041 Other hammer toe(s) (acquired), right foot: Secondary | ICD-10-CM

## 2021-03-09 DIAGNOSIS — M2011 Hallux valgus (acquired), right foot: Secondary | ICD-10-CM

## 2021-03-09 DIAGNOSIS — E1142 Type 2 diabetes mellitus with diabetic polyneuropathy: Secondary | ICD-10-CM

## 2021-03-09 DIAGNOSIS — M2012 Hallux valgus (acquired), left foot: Secondary | ICD-10-CM

## 2021-03-09 NOTE — Progress Notes (Signed)
Patient presents to the office today for diabetic shoe and insole measuring.  Patient was measured with brannock device to determine size and width for 1 pair of extra depth shoes and foam casted for 3 pair of insoles.   Documentation of medical necessity will be sent to patient's treating diabetic doctor to verify and sign.   Patient's diabetic provider: Dr. Cato Mulligan  Shoes and insoles will be ordered at that time and patient will be notified for an appointment for fitting when they arrive.   Shoe size (per patient): 7.5-8   Brannock measurement: 8D  Patient shoe selection-   1st choice:   Apex [id number]  2nd choice:  Apex [id number]  Shoe size ordered: 8 XW

## 2021-03-12 ENCOUNTER — Other Ambulatory Visit: Payer: Medicare Other

## 2021-03-12 ENCOUNTER — Telehealth: Payer: Self-pay

## 2021-03-12 NOTE — Telephone Encounter (Signed)
Return pt's call - stated she had been paying $35 for Amanda [name] but when she went to the pharmacy today, she was told $70.00. Pt stated too expensive. I will ask Rachelle, pharm to look into this. thanks

## 2021-03-12 NOTE — Telephone Encounter (Signed)
Pt states she need help with getting  insulin degludec (TRESIBA) 100 UNIT/ML FlexTouch Pen. Please call pt back.

## 2021-03-15 ENCOUNTER — Telehealth: Payer: Self-pay

## 2021-03-15 DIAGNOSIS — Z794 Long term (current) use of insulin: Secondary | ICD-10-CM

## 2021-03-15 DIAGNOSIS — E118 Type 2 diabetes mellitus with unspecified complications: Secondary | ICD-10-CM

## 2021-03-15 NOTE — Telephone Encounter (Signed)
Pt called and is requesting a call back about her insulin  ( I tried to send you an insta chat )

## 2021-03-15 NOTE — Telephone Encounter (Signed)
I called the pharmacy who stated since pt is on a low dose of [place] she is given 1 pen which is a 50 day supply instead of a 30 day supply. The pharmacist stated either she's being charged double or a in a higher tier. Also stated she paid $70 last time,

## 2021-03-16 ENCOUNTER — Other Ambulatory Visit: Payer: Self-pay | Admitting: Internal Medicine

## 2021-03-16 ENCOUNTER — Ambulatory Visit: Payer: Medicare Other | Admitting: Nurse Practitioner

## 2021-03-16 MED ORDER — INSULIN GLARGINE 100 UNIT/ML SOLOSTAR PEN: 6.0000 [IU] | PEN_INJECTOR | Freq: Every day | SUBCUTANEOUS | 0 refills | Status: DC

## 2021-03-16 MED FILL — LANTUS SOLOSTAR 100 UNITS/M: 100 | 28 days supply | Qty: 3 | Fill #0

## 2021-03-16 NOTE — Telephone Encounter (Signed)
Dr Hughes Better, Pharm stated this has been taken care of. She will put pt back on Lantus which will cost $35.00.

## 2021-03-16 NOTE — Telephone Encounter (Signed)
Called and spoke with patient on the phone regarding issue with [place] [facility] cost. Patient was previously paying $35 for Lantus and when switched to [place] cost went up to $70. Patient did pick up [place] in February but cannot continue to pay $70. She requests to switch back to Lantus, which is Tier 1 on her insurance formulary. Will send in Lantus 6 units once daily to [facility] per patient request.   Provided patient my direct number if she has any issues or questions.  Patient verbalized proper hypoglycemia treatment.  Follow-up with [name], RD scheduled on 03/23/21.

## 2021-03-17 ENCOUNTER — Other Ambulatory Visit (HOSPITAL_COMMUNITY): Payer: Self-pay

## 2021-03-17 MED FILL — metroNIDAZOLE 250 MG TABS: 250 | 10 days supply | Qty: 30 | Fill #0

## 2021-03-17 MED FILL — AMOXICILLIN 250 MG CAPSULE: 250 | 10 days supply | Qty: 30 | Fill #0

## 2021-03-17 NOTE — Progress Notes (Signed)
03/17/2021 Amanda [name] [id number] May 30, 1951   Chief Complaint:  Diarrhea   History of Present Illness: Amanda Cardy. [name] is a 70 year old female with a past medical history of arthritis, hypertension, hyperlipidemia, DM II, anemia, CKD stage III, acute metabolic encephalopathy (unknown etiology) admitted to the hospital 9/21 - 09/24/2019 and neuropathy. She was last seen in the office by Alonza Bogus on 06/10/2020 due to having chronic diarrhea. Labs showed an elevated TTG IgG antibody and an EGD was recommended to rule out Celiac disease, however, she did not wish to purse an EGD at that time. A SIBO breath test was ordered but was not completed. She presents to our office today with complaints of ongoing diarrhea. No recent antibiotic use. Imodium results in constipation. She passes a nonbloody watery to mud like stool 3 to 4 times daily.  Intermittent lower abdominal cramping. She passes a normal solid stool once or twice monthly. No GERD symptoms. No melena. She remains on Metformin, stopped Ozempic 6 months ago. She eats cheese, yogurt and drinks milk a few days weekly. She has gained 20lbs over the past year. She underwent a colonoscopy 01/11/2019 which showed one polypoid polyp and colon biopsies were negative for microscopic colitis or IBD. CTAP 09/09/2019 showed mild diffuse thickened appearance of the colon, underdistension verses mild colitis. History of anemia for which she takes Ferrous Sulfate 339m QD. She uses topical Voltaren, no oral NSAIDS. No other complaints at this time.    CBC Latest Ref Rng & Units 10/12/2020 11/11/2019 09/30/2019  WBC 3.4 - 10.8 x10E3/uL 5.4 5.1 3.8  Hemoglobin 11.1 - 15.9 g/dL 10.8(L) 11.1 8.9(L)  Hematocrit 34.0 - 46.6 % 33.1(L) 35.1 28.2(L)  Platelets 150 - 450 x10E3/uL 287 311 400  MCV 94   CMP Latest Ref Rng & Units 12/30/2020 11/23/2020 10/12/2020  Glucose 65 - 99 mg/dL 135(H) 110(H) 386(H)  BUN 8 - 27 mg/dL 40(H) 32(H) 30(H)  Creatinine 0.57 -  1.00 mg/dL 1.43(H) 1.49(H) 1.44(H)  Sodium 134 - 144 mmol/L 142 141 137  Potassium 3.5 - 5.2 mmol/L 4.7 4.8 4.5  Chloride 96 - 106 mmol/L 108(H) 107(H) 99  CO2 20 - 29 mmol/L '20 22 24  ' Calcium 8.7 - 10.3 mg/dL 8.9 9.3 9.4  Total Protein 6.5 - 8.1 g/dL - - -  Total Bilirubin 0.3 - 1.2 mg/dL - - -  Alkaline Phos 38 - 126 U/L - - -  AST 15 - 41 U/L - - -  ALT 0 - 44 U/L - - -   TSH 2.33 on 06/10/2020  Colonoscopy 01/11/2019: - A 1 mm polyp was found in the transverse colon. The polyp was sessile. The polyp was removed with a cold snare. Resection and retrieval were complete. - The exam was otherwise without abnormality on direct and retroflexion views. - Biopsies for histology were taken with a cold forceps from the entire colon for evaluation of microscopic colitis. - Recall colonoscopy 10 years. Biopsy Report:  - COLONIC MUCOSA WITH NO SIGNIFICANT PATHOLOGIC CHANGES. - NO MICROSCOPIC COLITIS, ACTIVE INFLAMMATION OR GRANULOMAS.   Surgical [P], transverse, polyp - POLYPOID COLONIC MUCOSA. - NO ADENOMATOUS CHANGE OR MALIGNANCY  Current Outpatient Medications on File Prior to Visit  Medication Sig Dispense Refill  . ACCU-CHEK AVIVA PLUS test strip USE AS DIRECTED UP TO FOUR TIMES DAILY 200 strip 0  . Accu-Chek Softclix Lancets lancets USE AS DIRECTED UP TO 3 TIMES DAILY 300 each 3  . acetaminophen (TYLENOL) 325 MG  tablet Take 2 tablets (650 mg total) by mouth every 6 (six) hours as needed (or Fever >/= 101). 60 tablet 0  . amLODipine (NORVASC) 10 MG tablet Take 1 tablet (10 mg total) by mouth daily. 30 tablet 2  . atorvastatin (LIPITOR) 40 MG tablet Take 1 tablet (40 mg total) by mouth daily at 6 PM. 90 tablet 1  . blood glucose meter kit and supplies KIT Dispense based on patient and insurance preference. Use up to four times daily as directed. . 1 each 0  . cetirizine (ZYRTEC) 5 MG tablet Take 1 tablet (5 mg total) by mouth daily. 30 tablet 1  . chlorthalidone (HYGROTON) 25 MG tablet  Take 1 tablet (25 mg total) by mouth daily. 90 tablet 0  . Cholecalciferol (VITAMIN D3) 2000 units capsule Take by mouth daily.     . diclofenac Sodium (VOLTAREN) 1 % GEL Apply topically.    . Ferrous Sulfate (IRON) 325 (65 Fe) MG TABS Take 1 tablet (325 mg total) by mouth daily. 30 tablet 0  . fluticasone (FLONASE) 50 MCG/ACT nasal spray Place 2 sprays into both nostrils daily. 16 g 2  . glucose blood (ACCU-CHEK GUIDE) test strip Check blood sugar up to 3 times per day 300 each 3  . insulin glargine (LANTUS) 100 UNIT/ML Solostar Pen Inject 6 Units into the skin daily. 15 mL 0  . Insulin Pen Needle 32G X 4 MM MISC 30 each by Does not apply route daily. 30 each 3  . latanoprost (XALATAN) 0.005 % ophthalmic solution 1 drop at bedtime.    [name] lisinopril (ZESTRIL) 10 MG tablet Take 1 tablet (10 mg total) by mouth daily. 90 tablet 3  . metFORMIN (GLUCOPHAGE) 1000 MG tablet Take 1 tablet (1,000 mg total) by mouth 2 (two) times daily with a meal. 60 tablet 2  . Multiple Vitamins-Minerals (WOMENS 50+ MULTI VITAMIN/MIN) TABS Take 1 tablet by mouth daily. 30 tablet    No current facility-administered medications on file prior to visit.   Allergies  Allergen Reactions  . Gabapentin Other (See Comments)    Pt states medication made her feel "out of it".   Pt states medication made her feel "out of it".      Current Medications, Allergies, Past Medical History, Past Surgical History, Family History and Social History were reviewed in [employer] record.   Review of Systems:   Constitutional: Negative for fever, sweats, chills or weight loss.  Respiratory: Negative for shortness of breath.   Cardiovascular: Negative for chest pain, palpitations and leg swelling.  Gastrointestinal: See HPI.  Musculoskeletal: Negative for back pain or muscle aches.  Neurological: Negative for dizziness, headaches or paresthesias.   Physical Exam: BP 118/60   Pulse 68   Wt 138 lb (62.6 kg)    BMI 25.24 kg/m  Wt Readings from Last 3 Encounters:  03/18/21 138 lb (62.6 kg)  01/27/21 140 lb 1.6 oz (63.5 kg)  01/20/21 140 lb 14.4 oz (63.9 kg)   General: Well developed 70 year old female in no acute distress. Head: Normocephalic and atraumatic. Eyes: No scleral icterus. Conjunctiva pink . Ears: Normal auditory acuity. Mouth: Dentition intact. No ulcers or lesions.  Lungs: Clear throughout to auscultation. Heart: Regular rate and rhythm, no murmur. Abdomen: Soft, nontender and nondistended. No masses or hepatomegaly. Normal bowel sounds x 4 quadrants.  Rectal: Deferred. Musculoskeletal: Symmetrical with no gross deformities. Extremities: No edema. Neurological: Alert oriented x 4. No focal deficits.  Psychological: Alert and  cooperative. Normal mood and affect  Assessment and Recommendations:  79. 70 year old with chronic diarrhea. Elevated tTG IgG ab.  -EGD to rule out celiac disease  -Pancreatic elastase level to rule out pancreatic insufficiency  -Check GI pathogen panel if diarrhea worsens  -CRP -Lactaid 1 to 2 tab with each dairy product   2. Normocytic anemia, multifactorial: CKD, possible celiac disease. No overt GI bleeding. She remains on Ferrous Sulfate since at least 09/2019.  -CBC, iron, iron saturation, TIBC, ferritin, vitamin B12, folate  3. Intermittent lower abdominal pain  -Dicyclomine 15m one po bid PRN  -CTAP if abdominal pain worsens   5. DM II remains on Metformin and insulin, no recent dose changes, no longer on Ozempic.   6. Colon cancer screening -Next colonoscopy due 12/2028, earlier if symptoms warrant  Further recommendations to be determined after the above evaluation completed

## 2021-03-18 ENCOUNTER — Other Ambulatory Visit: Payer: Self-pay

## 2021-03-18 ENCOUNTER — Other Ambulatory Visit (INDEPENDENT_AMBULATORY_CARE_PROVIDER_SITE_OTHER): Payer: Medicare Other

## 2021-03-18 ENCOUNTER — Other Ambulatory Visit: Payer: Self-pay | Admitting: Nurse Practitioner

## 2021-03-18 ENCOUNTER — Ambulatory Visit: Payer: Medicare Other | Admitting: Nurse Practitioner

## 2021-03-18 ENCOUNTER — Encounter: Payer: Self-pay | Admitting: Nurse Practitioner

## 2021-03-18 VITALS — BP 118/60 | HR 68 | Wt 138.0 lb

## 2021-03-18 DIAGNOSIS — D649 Anemia, unspecified: Secondary | ICD-10-CM | POA: Diagnosis not present

## 2021-03-18 DIAGNOSIS — R1030 Lower abdominal pain, unspecified: Secondary | ICD-10-CM

## 2021-03-18 DIAGNOSIS — R1010 Upper abdominal pain, unspecified: Secondary | ICD-10-CM

## 2021-03-18 DIAGNOSIS — K529 Noninfective gastroenteritis and colitis, unspecified: Secondary | ICD-10-CM | POA: Diagnosis not present

## 2021-03-18 LAB — CBC WITH DIFFERENTIAL/PLATELET
Basophils Absolute: 0 10*3/uL (ref 0.0–0.1)
Basophils Relative: 0.2 % (ref 0.0–3.0)
Eosinophils Absolute: 0.1 10*3/uL (ref 0.0–0.7)
Eosinophils Relative: 1.4 % (ref 0.0–5.0)
HCT: 31.4 % — ABNORMAL LOW (ref 36.0–46.0)
Hemoglobin: 10.7 g/dL — ABNORMAL LOW (ref 12.0–15.0)
Lymphocytes Relative: 23.8 % (ref 12.0–46.0)
Lymphs Abs: 1.5 10*3/uL (ref 0.7–4.0)
MCHC: 34.1 g/dL (ref 30.0–36.0)
MCV: 87.9 fl (ref 78.0–100.0)
Monocytes Absolute: 0.3 10*3/uL (ref 0.1–1.0)
Monocytes Relative: 5.2 % (ref 3.0–12.0)
Neutro Abs: 4.5 10*3/uL (ref 1.4–7.7)
Neutrophils Relative %: 69.4 % (ref 43.0–77.0)
Platelets: 341 10*3/uL (ref 150.0–400.0)
RBC: 3.57 Mil/uL — ABNORMAL LOW (ref 3.87–5.11)
RDW: 13.3 % (ref 11.5–15.5)
WBC: 6.5 10*3/uL (ref 4.0–10.5)

## 2021-03-18 LAB — B12 AND FOLATE PANEL
Folate: >23.6 ng/mL (ref >5.9)
Vitamin B-12: 868 pg/mL (ref 211–911)

## 2021-03-18 LAB — C-REACTIVE PROTEIN: CRP: <1 mg/dL (ref 0.5–20.0)

## 2021-03-18 MED ORDER — DICYCLOMINE HCL 10 MG PO CAPS: ORAL_CAPSULE | ORAL | 0 refills | Status: DC

## 2021-03-18 MED FILL — DICYCLOMINE 10 MG CAPSULE: 10 | 45 days supply | Qty: 90 | Fill #0

## 2021-03-18 NOTE — Patient Instructions (Addendum)
If you are age 69 or older, your body mass index should be between 23-30. Your Body mass index is 25.24 kg/m. If this is out of the aforementioned range listed, please consider follow up with your Primary Care Provider.  LABS:  Lab work has been ordered for you today. Our lab is located in the basement. Press "B" on the elevator. The lab is located at the first door on the left as you exit the elevator.  MEDICATION: We have sent the following medication to your pharmacy for you to pick up at your convenience: Dicyclomine 10 MG take 1 tablet twice a day as needed for abdominal cramping.  OVER THE COUNTER MEDICATION Please purchase the following medications over the counter and take as directed:  Lactaid 1-2 tablets with each dairy product.  PROCEDURES: You have been scheduled for a endoscopy. Please follow the written instructions given to you at your visit today. If you use inhalers (even only as needed), please bring them with you on the day of your procedure.  Please call our office if your symptoms worsen.  It was great seeing you today! Thank you for entrusting me with your care and choosing [facility].  Noralyn Pick, CRNP              It was great seeing you today! Thank you for entrusting me with your care and choosing [facility]  Noralyn Pick, CRNP

## 2021-03-19 LAB — IRON,TIBC AND FERRITIN PANEL
%SAT: 27 % (calc) (ref 16–45)
Ferritin: 38 ng/mL (ref 16–288)
Iron: 75 ug/dL (ref 45–160)
TIBC: 274 mcg/dL (calc) (ref 250–450)

## 2021-03-19 NOTE — Progress Notes (Signed)
I agree with the above note, plan 

## 2021-03-20 ENCOUNTER — Other Ambulatory Visit (HOSPITAL_COMMUNITY): Payer: Self-pay

## 2021-03-22 ENCOUNTER — Other Ambulatory Visit (HOSPITAL_COMMUNITY): Payer: Self-pay

## 2021-03-22 MED FILL — Amlodipine Besylate Tab 10 MG (Base Equivalent): ORAL | 30 days supply | Qty: 30 | Fill #0 | Status: AC

## 2021-03-23 ENCOUNTER — Encounter: Payer: Medicare Other | Admitting: Dietician

## 2021-03-23 ENCOUNTER — Other Ambulatory Visit (HOSPITAL_COMMUNITY): Payer: Self-pay

## 2021-03-23 ENCOUNTER — Other Ambulatory Visit: Payer: Self-pay | Admitting: Internal Medicine

## 2021-03-23 DIAGNOSIS — E118 Type 2 diabetes mellitus with unspecified complications: Secondary | ICD-10-CM

## 2021-03-23 DIAGNOSIS — Z794 Long term (current) use of insulin: Secondary | ICD-10-CM

## 2021-03-23 MED ORDER — GLUCOSE BLOOD VI STRP
ORAL_STRIP | 0 refills | Status: DC
  Filled 2021-03-23: qty 200, 50d supply, fill #0

## 2021-03-23 MED ORDER — INSULIN PEN NEEDLE 32G X 4 MM MISC
Freq: Every day | 0 refills | Status: DC
  Filled 2021-03-23 – 2021-04-29 (×6): qty 100, 90d supply, fill #0

## 2021-03-23 MED ORDER — LANTUS SOLOSTAR 100 UNIT/ML ~~LOC~~ SOPN
6.0000 [IU] | PEN_INJECTOR | Freq: Every day | SUBCUTANEOUS | 0 refills | Status: DC
  Filled 2021-03-23: qty 3, 28d supply, fill #0
  Filled 2021-04-16: qty 9, 84d supply, fill #0
  Filled 2021-04-29: qty 3, 28d supply, fill #0

## 2021-03-23 NOTE — Telephone Encounter (Signed)
Spoke with [name] about rescheduling her appointment today. Rescheduled for 05/19/21. She stated that she liked the [place] but it is too expensive, so has gone back to the Lantus.  Blood sugar 142 this am. She thought Ozempic worked for her as well but stopped that because it was too expensive.  Wants to try taking 3 units Lantus twice daily at breakfast and 3 units at dinner. Her abdomen fells better now that she is using her arm for injections. She stated that she is  feeling bloated and having diarrhea every 2-4 days without any formed BM.  I reminded her about our discussion about reducing lactose in her diet.  (she ate ice cream last night.) She asks for a refill on her pen needles and lantus solostar insulin.(needs to discard each pen and contents 28 days after first use)

## 2021-03-23 NOTE — Telephone Encounter (Signed)
Insulin and pends refilled.

## 2021-03-24 ENCOUNTER — Encounter: Payer: Self-pay | Admitting: Dietician

## 2021-03-24 ENCOUNTER — Other Ambulatory Visit: Payer: Medicare Other

## 2021-03-24 ENCOUNTER — Other Ambulatory Visit: Payer: Self-pay | Admitting: Dietician

## 2021-03-24 ENCOUNTER — Telehealth: Payer: Self-pay | Admitting: Dietician

## 2021-03-24 ENCOUNTER — Other Ambulatory Visit (HOSPITAL_COMMUNITY): Payer: Self-pay

## 2021-03-24 DIAGNOSIS — Z794 Long term (current) use of insulin: Secondary | ICD-10-CM

## 2021-03-24 DIAGNOSIS — E113513 Type 2 diabetes mellitus with proliferative diabetic retinopathy with macular edema, bilateral: Secondary | ICD-10-CM

## 2021-03-24 MED ORDER — INSULIN PEN NEEDLE 32G X 4 MM MISC
3 refills | Status: DC
  Filled 2021-03-24: qty 100, 90d supply, fill #0
  Filled 2021-04-29: qty 100, 33d supply, fill #0
  Filled 2021-04-29: qty 100, 90d supply, fill #0

## 2021-03-24 NOTE — Telephone Encounter (Addendum)
Amanda [name] would like a Dexcom G6 CGM prescription sent to another supplier. Her insurance is not in network with [place] she in in network with [employer]. Called [facility] who said her insurance requires injecting insulin 3x/day and she does not meet that requirement so does not qualify.

## 2021-03-24 NOTE — Telephone Encounter (Signed)
Prescription signed.

## 2021-03-24 NOTE — Telephone Encounter (Signed)
What are our options here based on your experience? I certainly don't want to have her stab herself 3 times a day unless it is the only way to get her the CGM.

## 2021-03-24 NOTE — Progress Notes (Signed)
Medication Samples have been provided to the patient.  Drug name: tresiba       Strength: u-100        Qty:  1 pen/box  LOT: [id number]  Exp.Date: 11/22  Dosing instructions: inject 6 units daily  The patient has been instructed regarding the correct time, dose, and frequency of taking this medication, including desired effects and most common side effects.   Vista Deck 2:43 PM 03/24/2021

## 2021-03-24 NOTE — Telephone Encounter (Signed)
Called Amanda [name] per pharmacy who says they can provide her with a sample of [place] and an application to [employer] for patient assistance. She agrees to come pick up a sample of [place] and the application the week of April 20th so that she can finish using her Lantus first. We discussed that her  Lantus pen should be discarded after 4 weeks and the Tresiba pens after 8 weeks.

## 2021-03-24 NOTE — Telephone Encounter (Signed)
Thank you! Will have the sample and application ready for pickup on 4/19.

## 2021-03-25 NOTE — Telephone Encounter (Signed)
I suggest doing a Professional CGM every 3 months.

## 2021-03-26 NOTE — Telephone Encounter (Signed)
Okay.  Thanks.

## 2021-03-30 ENCOUNTER — Other Ambulatory Visit: Payer: Self-pay

## 2021-03-30 ENCOUNTER — Ambulatory Visit
Admission: RE | Admit: 2021-03-30 | Discharge: 2021-03-30 | Disposition: A | Discharge status: Home / Self Care | Payer: Medicare Other | Source: Ambulatory Visit | Attending: Internal Medicine | Admitting: Internal Medicine

## 2021-03-30 DIAGNOSIS — Z1231 Encounter for screening mammogram for malignant neoplasm of breast: Secondary | ICD-10-CM

## 2021-03-30 LAB — PANCREATIC ELASTASE, FECAL: Pancreatic Elastase-1, Stool: 318 mcg/g

## 2021-04-05 ENCOUNTER — Encounter: Payer: Medicare Other | Admitting: Gastroenterology

## 2021-04-06 ENCOUNTER — Other Ambulatory Visit (HOSPITAL_COMMUNITY): Payer: Self-pay

## 2021-04-06 MED FILL — Lancets: 90 days supply | Qty: 300 | Fill #0 | Status: CN

## 2021-04-06 MED FILL — Glucose Blood Test Strip: 90 days supply | Qty: 300 | Fill #0 | Status: CN

## 2021-04-07 ENCOUNTER — Telehealth: Payer: Self-pay

## 2021-04-07 ENCOUNTER — Encounter: Payer: Medicare Other | Admitting: Gastroenterology

## 2021-04-07 NOTE — Telephone Encounter (Signed)
-----   Message from Noralyn Pick, NP sent at 04/05/2021  7:43 AM EDT ----- Lenna Sciara, pls inform patient her pancreatic elastase level was normal, no evidence of pancreatic insufficiency. Thx.

## 2021-04-07 NOTE — Telephone Encounter (Signed)
Notified patient of test result message from [place] , patient expressed understanding and agreement, no further questions at this time.

## 2021-04-09 ENCOUNTER — Telehealth: Payer: Medicare Other

## 2021-04-09 ENCOUNTER — Telehealth: Payer: Self-pay | Admitting: *Deleted

## 2021-04-09 NOTE — Telephone Encounter (Signed)
  Care Management   Note  04/09/2021 Name: Amanda [name] MRN: [id number] DOB: 1951/04/01  [name] is enrolled in a Managed Medicaid plan: No. Outreach attempt today was unsuccessful.   A HIPPA compliant phone message was left for the patient providing contact information and requesting a return call.   The care management team will reach out to he patient over the next 7-14 days.  [name] RN, CCM, [facility] RN Care Manager [phone]

## 2021-04-12 ENCOUNTER — Telehealth: Payer: Self-pay | Admitting: *Deleted

## 2021-04-12 ENCOUNTER — Telehealth: Payer: Medicare Other

## 2021-04-12 NOTE — Telephone Encounter (Signed)
  Care Management   Outreach Note  04/12/2021 Name: Amanda [name] MRN: [id number] DOB: 09-23-1951  Referred by: Cato Mulligan, MD Reason for referral : Care Coordination ( IDDM, HLD, HTN, CKD, Depression)   A second unsuccessful telephone outreach was attempted today. The patient was referred to the case management team for assistance with care management and care coordination.   Follow Up Plan: A HIPAA compliant phone message was left for the patient providing contact information and requesting a return call.  The care management team will reach out to the patient again over the next 7-14 days.   [name] RN, CCM, [facility] RN Care Manager [phone]

## 2021-04-14 ENCOUNTER — Other Ambulatory Visit (HOSPITAL_COMMUNITY): Payer: Self-pay

## 2021-04-15 ENCOUNTER — Other Ambulatory Visit (HOSPITAL_COMMUNITY): Payer: Self-pay

## 2021-04-15 ENCOUNTER — Telehealth: Payer: Self-pay | Admitting: Podiatry

## 2021-04-15 MED FILL — Metformin HCl Tab 1000 MG: ORAL | 30 days supply | Qty: 60 | Fill #0 | Status: AC

## 2021-04-15 NOTE — Telephone Encounter (Signed)
Pt left message asking about status of diabetic shoes it has not been quite a month.  I returned call and left message that we have not received the documents from her pcp and it has been faxed multiple times. That is what has the shoe order on hold.

## 2021-04-16 ENCOUNTER — Other Ambulatory Visit (HOSPITAL_COMMUNITY): Payer: Self-pay

## 2021-04-16 MED FILL — Lancets: 66 days supply | Qty: 200 | Fill #0 | Status: AC

## 2021-04-16 MED FILL — Glucose Blood Test Strip: 83 days supply | Qty: 250 | Fill #0 | Status: AC

## 2021-04-19 ENCOUNTER — Other Ambulatory Visit (HOSPITAL_COMMUNITY): Payer: Self-pay

## 2021-04-19 ENCOUNTER — Telehealth: Payer: Self-pay | Admitting: Podiatry

## 2021-04-19 NOTE — Telephone Encounter (Signed)
Pt left message asking about status of diabetic shoes..  I returned call and left message that we have faxed documents to pcp multiple times and have not received the signed documents back yet.

## 2021-04-20 ENCOUNTER — Other Ambulatory Visit (HOSPITAL_COMMUNITY): Payer: Self-pay

## 2021-04-21 ENCOUNTER — Other Ambulatory Visit (HOSPITAL_COMMUNITY): Payer: Self-pay

## 2021-04-23 ENCOUNTER — Other Ambulatory Visit: Payer: Self-pay

## 2021-04-23 ENCOUNTER — Encounter: Payer: Self-pay | Admitting: Podiatry

## 2021-04-23 ENCOUNTER — Ambulatory Visit: Payer: Medicare Other | Admitting: Podiatry

## 2021-04-23 ENCOUNTER — Encounter: Payer: Self-pay | Admitting: Student

## 2021-04-23 ENCOUNTER — Ambulatory Visit (INDEPENDENT_AMBULATORY_CARE_PROVIDER_SITE_OTHER): Payer: Medicare Other | Admitting: Student

## 2021-04-23 DIAGNOSIS — M79674 Pain in right toe(s): Secondary | ICD-10-CM | POA: Diagnosis not present

## 2021-04-23 DIAGNOSIS — B351 Tinea unguium: Secondary | ICD-10-CM | POA: Diagnosis not present

## 2021-04-23 DIAGNOSIS — E1142 Type 2 diabetes mellitus with diabetic polyneuropathy: Secondary | ICD-10-CM | POA: Diagnosis not present

## 2021-04-23 DIAGNOSIS — M25572 Pain in left ankle and joints of left foot: Secondary | ICD-10-CM

## 2021-04-23 DIAGNOSIS — Z0000 Encounter for general adult medical examination without abnormal findings: Secondary | ICD-10-CM

## 2021-04-23 DIAGNOSIS — L84 Corns and callosities: Secondary | ICD-10-CM

## 2021-04-23 DIAGNOSIS — M25571 Pain in right ankle and joints of right foot: Secondary | ICD-10-CM | POA: Diagnosis not present

## 2021-04-23 DIAGNOSIS — M79675 Pain in left toe(s): Secondary | ICD-10-CM | POA: Diagnosis not present

## 2021-04-23 DIAGNOSIS — M79605 Pain in left leg: Secondary | ICD-10-CM

## 2021-04-23 DIAGNOSIS — M79604 Pain in right leg: Secondary | ICD-10-CM | POA: Diagnosis not present

## 2021-04-23 NOTE — Progress Notes (Signed)
This AWV is being conducted by [place] only. The patient was located at home and I was located in [facility]. The patient's identity was confirmed using their DOB and current address. The patient or his/her legal guardian has consented to being evaluated through a telephone encounter and understands the associated risks (an examination cannot be done and the patient may need to come in for an appointment) / benefits (allows the patient to remain at home, decreasing exposure to coronavirus). I personally spent 30 minutes conducting the AWV.  Subjective:   MARCILLE BARMAN is a 70 y.o. female who presents for a Medicare Annual Wellness Visit.  The following items have been reviewed and updated today in the appropriate area in the EMR.    Fall risk / safety level Advance directive discussion Medical and family history were reviewed and updated Updating list of other providers & suppliers Medication reconciliation, including over the counter medicines   Social History   Social History Narrative   Current Social History 04/23/2021        Patient lives with husband in a one level home with ramp      Patient's method of transportation is personal car shared with her mother.      The highest level of education was high school diploma.      The patient currently retired from [employer].   Patient stopped interview halfway through Social History as there was someone in background who was upset about her sharing family hx of hypertension. She requests to finish visit at another time.      Objective:    Vitals: There were no vitals taken for this visit. Vitals are unable to obtained due to [id number] public health emergency  Activities of Daily Living In your present state of health, do you have any difficulty performing the following activities: 04/23/2021 01/20/2021  Hearing? N N  Vision? Y Y  Comment - -  Difficulty concentrating or making decisions? Y Y  Comment "Memory" -   Walking or climbing stairs? Y N  Comment 2/2 leg/feet pain -  Dressing or bathing? N N  Doing errands, shopping? N N  Some recent data might be hidden    Goals Goals    . Blood Pressure < 140/90     BP Readings from Last 3 Encounters:  09/14/20 (!) 199/97  07/03/20 (!) 177/87  06/10/20 (!) 152/84    Not meeting blood pressure targets     . Have 3 meals a day    . HEMOGLOBIN A1C < 7     Lab Results  Component Value Date   HGBA1C 7.9 (A) 07/03/2020   Not  meeting Hgb A1C target.    [name] LDL CALC < 100     Lab Results  Component Value Date   CHOL 127 09/10/2019   HDL 54 09/10/2019   LDLCALC 54 09/10/2019   TRIG 115 09/13/2019   CHOLHDL 2.4 09/10/2019    Meeting lipid targets     . Make and Keep All Appointments     Timeframe:  Long-Range Goal Priority:  High Start Date:       12/30/20                      Expected End Date:                       Follow Up Date 04/09/21   - ask family or friend for a  ride - call to cancel if needed - keep a calendar with appointment dates    Why is this important?    Part of staying healthy is seeing the doctor for follow-up care.   If you forget your appointments, there are some things you can do to stay on track.    Notes: 03/08/21- meeting goal    . Monitor and Manage My Blood Sugar-Diabetes Type 2     Timeframe:  Long-Range Goal Priority:  High Start Date:          04/08/20                   Expected End Date:                       Follow Up Date 04/09/21   - check blood sugar at prescribed times - check blood sugar if I feel it is too high or too low - take the blood sugar meter to all doctor visits  - refer to the information mailed to me on 12/31/20 to check out the ADA "Ask The Experts" podcast series   Why is this important?    Checking your blood sugar at home helps to keep it from getting very high or very low.   Writing the results in a diary or log helps the doctor know how to care for you.   Your blood sugar  log should have the time, date and the results.   Also, write down the amount of insulin or other medicine that you take.   Other information, like what you ate, exercise done and how you were feeling, will also be helpful.     Notes: Meeting goal    . Track and Manage My Blood Pressure-Hypertension     Timeframe:  Long-Range Goal Priority:  High Start Date:        04/08/20                     Expected End Date:                       Follow Up Date 4/22//22   - check blood pressure daily - choose a place to take my blood pressure (home, clinic or office, retail store) - write blood pressure results in a log or diary    Why is this important?    You won't feel high blood pressure, but it can still hurt your blood vessels.   High blood pressure can cause heart or kidney problems. It can also cause a stroke.   Making lifestyle changes like losing a little weight or eating less salt will help.   Checking your blood pressure at home and at different times of the day can help to control blood pressure.   If the doctor prescribes medicine remember to take it the way the doctor ordered.   Call the office if you cannot afford the medicine or if there are questions about it.     Notes: meeting goal       Fall Risk Fall Risk  04/23/2021 01/20/2021 12/30/2020 10/28/2020 10/12/2020  Falls in the past year? 0 0 0 0 1  Comment - - - - a few "fall to my  knees" at home "get up too fast or spin around too fast"  Number falls in past yr: - - 0 0 1  Comment - - - - -  Injury with  Fall? - - 0 0 0  Comment - - - - -  Risk Factor Category  - - - - -  Risk for fall due to : Other (Comment) No Fall Risks - History of fall(s) History of fall(s)  Risk for fall due to: Comment bilat leg/feet pain - - - -  Follow up Education provided;Falls prevention discussed - - - Falls evaluation completed  Comment - - - - -        Velora Heckler, RN  04/23/2021

## 2021-04-26 ENCOUNTER — Telehealth: Payer: Self-pay | Admitting: *Deleted

## 2021-04-26 ENCOUNTER — Telehealth: Payer: Medicare Other

## 2021-04-26 NOTE — Progress Notes (Signed)
I discussed the AWV findings with the RN who conducted the visit.  The patient ended the appointment in early and wanted to reschedule.[name]

## 2021-04-26 NOTE — Telephone Encounter (Signed)
  Care Management   Outreach Note  04/26/2021 Name: EMOJEAN GERTZ MRN: [id number] DOB: November 16, 1951  Referred by: Cato Mulligan, MD Reason for referral : Care Coordination ( IDDM, HLD, HTN, CKD, Depression)   Third unsuccessful telephone outreach was attempted today. The patient was referred to the case management team for assistance with care management and care coordination. The patient's primary care provider has been notified of our unsuccessful attempts to make or maintain contact with the patient. The care management team is pleased to engage with this patient at any time in the future should he/she be interested in assistance from the care management team.   Follow Up Plan: A HIPAA compliant phone message was left for the patient providing contact information and requesting a return call. If patietn does not return call , will close to CCM services due to unable to maintain contact with patient.  [name] RN, CCM, [facility] RN Care Manager [phone]

## 2021-04-27 ENCOUNTER — Ambulatory Visit: Payer: Medicare Other | Admitting: *Deleted

## 2021-04-27 DIAGNOSIS — E118 Type 2 diabetes mellitus with unspecified complications: Secondary | ICD-10-CM

## 2021-04-27 DIAGNOSIS — N1831 Chronic kidney disease, stage 3a: Secondary | ICD-10-CM

## 2021-04-27 DIAGNOSIS — Z794 Long term (current) use of insulin: Secondary | ICD-10-CM

## 2021-04-27 DIAGNOSIS — I10 Essential (primary) hypertension: Secondary | ICD-10-CM

## 2021-04-27 DIAGNOSIS — F331 Major depressive disorder, recurrent, moderate: Secondary | ICD-10-CM

## 2021-04-27 DIAGNOSIS — E113513 Type 2 diabetes mellitus with proliferative diabetic retinopathy with macular edema, bilateral: Secondary | ICD-10-CM

## 2021-04-27 DIAGNOSIS — E782 Mixed hyperlipidemia: Secondary | ICD-10-CM

## 2021-04-27 NOTE — Chronic Care Management (AMB) (Signed)
_  Care Management   Outreach Note  04/27/2021 Name: Amanda [name] MRN: [id number] DOB: [birth year]  Referred by: Cato Mulligan, MD Reason for referral : Care Coordination (IDDM, HLD, HTN, CKD, Depression)   Patient did not respond to third outreach with 3rd voice mail requesting return call made on 04/26/21 so closing to CCM services; unable to maintain contact with patient. The patient was referred to the case management team for assistance with care management and care coordination. The patient's primary care provider has been notified of our unsuccessful attempts to make or maintain contact with the patient. The care management team is pleased to engage with this patient at any time in the future should he/she be interested in assistance from the care management team.   Follow Up Plan: No further follow up required: closing to [place], CCM, [facility] RN Care Manager [phone]

## 2021-04-28 NOTE — Progress Notes (Signed)
Internal Medicine Clinic Resident  I have personally reviewed this encounter including the documentation in this note and/or discussed this patient with the care management provider. I will address any urgent items identified by the care management provider and will communicate my actions to the patient's PCP. I have reviewed the patient's CCM visit with my supervising attending, Dr Jimmye Norman.  Jose Persia, MD 04/28/2021

## 2021-04-29 ENCOUNTER — Ambulatory Visit (INDEPENDENT_AMBULATORY_CARE_PROVIDER_SITE_OTHER): Payer: Medicare Other | Admitting: Internal Medicine

## 2021-04-29 ENCOUNTER — Other Ambulatory Visit (HOSPITAL_COMMUNITY): Payer: Self-pay

## 2021-04-29 ENCOUNTER — Encounter: Payer: Self-pay | Admitting: Internal Medicine

## 2021-04-29 VITALS — BP 115/64 | HR 89 | Temp 98.3°F | Wt 140.2 lb

## 2021-04-29 DIAGNOSIS — E118 Type 2 diabetes mellitus with unspecified complications: Secondary | ICD-10-CM | POA: Diagnosis not present

## 2021-04-29 DIAGNOSIS — I10 Essential (primary) hypertension: Secondary | ICD-10-CM

## 2021-04-29 DIAGNOSIS — J329 Chronic sinusitis, unspecified: Secondary | ICD-10-CM | POA: Insufficient documentation

## 2021-04-29 DIAGNOSIS — R053 Chronic cough: Secondary | ICD-10-CM

## 2021-04-29 DIAGNOSIS — J0100 Acute maxillary sinusitis, unspecified: Secondary | ICD-10-CM | POA: Diagnosis not present

## 2021-04-29 DIAGNOSIS — Z794 Long term (current) use of insulin: Secondary | ICD-10-CM

## 2021-04-29 DIAGNOSIS — E113513 Type 2 diabetes mellitus with proliferative diabetic retinopathy with macular edema, bilateral: Secondary | ICD-10-CM

## 2021-04-29 HISTORY — DX: Chronic sinusitis, unspecified: J32.9

## 2021-04-29 MED FILL — Fluticasone Propionate Nasal Susp 50 MCG/ACT: NASAL | 30 days supply | Qty: 16 | Fill #0 | Status: AC

## 2021-04-29 NOTE — Assessment & Plan Note (Signed)
Patient currently on metformin 1000 mg twice daily and Lantus 6 units daily.  Denies any issues taking medications.  She does not have her meter with her today, she states her blood sugars have been around 100s, can sometimes go up to the 170s.  Her last A1c was 8.7.  She is due for an A1c check today.  -Continue current medications -Check A1c today

## 2021-04-29 NOTE — Assessment & Plan Note (Signed)
Patient has a history of a chronic cough that was evaluated in February 2022, thought to be secondary to postnasal drip at that time and supportive measures with a Nettie pot, humidifier, and Flonase were provided.  She states that the cough did improve however over the past 2 weeks she has been having a daily dry cough that is worse at night. Feels like it is a little deeper. She is also presenting with symptoms of sinusitis which could be contributing. We discussed that we will wait until her sinus issues have improved, with supportive care and flonase, and that if her symptoms persist after that then we can start work-up for other potential causes of chronic cough. Common causes of chronic cough including medication induced(patient is on lisinopril), GERD, cough variant asthma, and upper airway cough syndrome.  If symptoms persist on follow-up could consider switching ACE inhibitor or trialing on acid reflux medication.

## 2021-04-29 NOTE — Progress Notes (Signed)
   CC: Congestion, sinus pressure, cough  HPI:  [name] is a 70 y.o. at the history of hypertension, chronic diarrhea, diabetes, CKD stage III, major depressive disorder, chronic cough, and seasonal allergies who is presenting with a 2-week history of a dry cough and sinus pressure, congestion, and headaches.  Past Medical History:  Diagnosis Date  . Allergy    SEASONAL  . Anemia   . Arthritis   . Cataract    PRESENT IN LEFT/ CATARACTS REMOVED FROM RIGHT  . Diabetes mellitus 1996  . DKA (diabetic ketoacidoses) 01/02/2013  . Hyperlipidemia   . Hypertension   . Hypotension   . Influenza A (H1N1) 01/03/2013  . Neuromuscular disorder ([place])    NEUROPATHY  . SYNCOPE 05/10/2010   Qualifier: Diagnosis of  By: Marilynne Halsted RN, BSN, Jacquelyn     Review of Systems:   Constitutional: Negative for chills and fever.  Respiratory: Negative for shortness of breath.   Cardiovascular: Negative for chest pain and leg swelling.  Gastrointestinal: Negative for abdominal pain, nausea and vomiting.  Neurological: Negative for dizziness and headaches.   Physical Exam:  Vitals:   04/29/21 1428  BP: 115/64  Pulse: 89  Temp: 98.3 F (36.8 C)  TempSrc: Oral  SpO2: 99%  Weight: 140 lb 3.2 oz (63.6 kg)   Physical Exam Constitutional:      Appearance: Normal appearance.  HENT:     Right Ear: Tympanic membrane and external ear normal.     Left Ear: Tympanic membrane and external ear normal.     Mouth/Throat:     Mouth: Mucous membranes are moist.     Pharynx: Oropharynx is clear. No oropharyngeal exudate.  Eyes:     Extraocular Movements: Extraocular movements intact.     Conjunctiva/sclera: Conjunctivae normal.     Pupils: Pupils are equal, round, and reactive to light.  Cardiovascular:     Rate and Rhythm: Normal rate and regular rhythm.  Pulmonary:     Effort: Pulmonary effort is normal.     Breath sounds: Normal breath sounds.  Abdominal:     General: Abdomen is flat. Bowel  sounds are normal.     Palpations: Abdomen is soft.  Musculoskeletal:     Cervical back: Normal range of motion and neck supple.  Neurological:     Mental Status: She is alert.      Assessment & Plan:   See Encounters Tab for problem based charting.  Patient discussed with Dr. Jimmye Norman

## 2021-04-29 NOTE — Assessment & Plan Note (Addendum)
Patient reports that she has been having a dry cough that has been going on for the past 2 weeks, her prior cough had improved however did not completely resolve and over the past 2 weeks she has been having a persistent dry cough, she feels like it is a little bit deeper now. For the past week she has been having some sinus pressure, congestion, headaches, bilateral ear pressure where it feels like fluid is in her ears, some mild decreased appetite, and occasional chills.  In regards to the ear pain she states that it is a pressure sensation, there is no drainage or pain when she touches her ears, she has not had any hearing loss.  She has been using Tylenol which helps, she is using her cetirizine daily for her seasonal allergies, has not been using Flonase.  She denies any fevers, nausea, chest pain, shortness of breath, or other symptoms.  She denies any recent sick contacts, she did have an exposure to someone who had a history of COVID, she is not sure when he had it, however this occurred after she already started having symptoms.  She has not had any recent travel. On exam she has some tenderness to palpation over the frontal and maxillary sinuses, pressure sensation when she leans forward, otoscopic exam showed normal tympanic membrane, no erythema, swelling, or retraction noted.  Some mild tenderness to palpation over the right cervical lymph nodes. Symptoms seems to be consistent with a viral URI versus seasonal allergies.  Patient does report a decreased appetite and decreased fluid intake, however is able to tolerate p.o. intake. No evidence of Otitis media or Otitis externa on exam.  -Discussed conservative management with Flonase, rest, hydration, and Tylenol as needed.  -Discussed that if symptoms do not improve within 2 weeks or start to worsen to return for further evaluation. -Check CBC and BMP

## 2021-04-29 NOTE — Patient Instructions (Addendum)
Ms. SPRING [place],  It was a pleasure to see you today. Thank you for coming in.   Today we discussed your ear pain and sinus congestion. I think that this is a viral sinusitis. Please start taking the flonase daily. Please make sure that you are getting plenty of rest and eating a healthy diet. I have printed some information out for you to review.   I am checking an A1c today. Please continue your current diabetic medications.   I am checking some labs today.   Please return to clinic in 3 month or sooner if needed.   Thank you again for coming in.   Asencion Noble.D.

## 2021-04-30 ENCOUNTER — Other Ambulatory Visit: Payer: Self-pay | Admitting: Internal Medicine

## 2021-04-30 DIAGNOSIS — E118 Type 2 diabetes mellitus with unspecified complications: Secondary | ICD-10-CM

## 2021-04-30 DIAGNOSIS — Z794 Long term (current) use of insulin: Secondary | ICD-10-CM

## 2021-04-30 LAB — CBC
Hematocrit: 32.2 % — ABNORMAL LOW (ref 34.0–46.6)
Hemoglobin: 10.5 g/dL — ABNORMAL LOW (ref 11.1–15.9)
MCH: 30.1 pg (ref 26.6–33.0)
MCHC: 32.6 g/dL (ref 31.5–35.7)
MCV: 92 fL (ref 79–97)
Platelets: 265 10*3/uL (ref 150–450)
RBC: 3.49 x10E6/uL — ABNORMAL LOW (ref 3.77–5.28)
RDW: 12 % (ref 11.7–15.4)
WBC: 3.9 10*3/uL (ref 3.4–10.8)

## 2021-04-30 LAB — BMP8+ANION GAP
Anion Gap: 18 mmol/L (ref 10.0–18.0)
BUN/Creatinine Ratio: 20 (ref 12–28)
BUN: 43 mg/dL — ABNORMAL HIGH (ref 8–27)
CO2: 18 mmol/L — ABNORMAL LOW (ref 20–29)
Calcium: 9.2 mg/dL (ref 8.7–10.3)
Chloride: 101 mmol/L (ref 96–106)
Creatinine, Ser: 2.14 mg/dL — ABNORMAL HIGH (ref 0.57–1.00)
Glucose: 215 mg/dL — ABNORMAL HIGH (ref 65–99)
Potassium: 5.2 mmol/L (ref 3.5–5.2)
Sodium: 137 mmol/L (ref 134–144)
eGFR: 25 mL/min/{1.73_m2} — ABNORMAL LOW (ref >59)

## 2021-04-30 LAB — HEMOGLOBIN A1C
Est. average glucose Bld gHb Est-mCnc: 183 mg/dL
Hgb A1c MFr Bld: 8 % — ABNORMAL HIGH (ref 4.8–5.6)

## 2021-04-30 MED ORDER — LANTUS SOLOSTAR 100 UNIT/ML ~~LOC~~ SOPN: 8.0000 [IU] | PEN_INJECTOR | Freq: Every day | SUBCUTANEOUS | 0 refills | Status: DC

## 2021-04-30 NOTE — Progress Notes (Signed)
Patient called.  Patient has acute kidney injury with creatinine 2.1 from 1.43 4 days ago. She states that she has not been drinking as much water, has been drinking some Boost.  She states that she will start drinking more water. Discussed that she should try to drink at least 8 large glasses of water per day. Also told to stop taking lisinopril for now. Follow up in 2 weeks to repeat labs.   Also discussed A1c results, reports her blood sugars have been up to 170-180s recently, and sometimes around 120s, does not have her meter with her right now. Denied any recent hypoglycemic readings. Advised to increase to Lantus 8 units daily, monitor for hypoglycemia, and bring in meter on her next visit.

## 2021-05-01 NOTE — Progress Notes (Signed)
  Subjective:  Patient ID: Amanda [name], female    DOB: 02-25-1951,  MRN: [id number]  70 y.o. female presents with at risk foot care with history of diabetic neuropathy and corn(s) right 5th toe and painful thick toenails that are difficult to trim. Painful toenails interfere with ambulation. Aggravating factors include wearing enclosed shoe gear. Pain is relieved with periodic professional debridement. Painful corns are aggravated when weightbearing when wearing enclosed shoe gear. Pain is relieved with periodic professional debridement.[name]    PCP: Cato Mulligan, MD.  Patient c/o painful neuropathy symptoms of painful bilateral ankle and leg pain for the past several months. She does have dx of neuropathy, but states her ankles have become more symptomatic. Denies any episodes of trauma. She has not attempted any treatment for this complaint.  Review of Systems: Negative except as noted in the HPI.   Allergies  Allergen Reactions  . Gabapentin Other (See Comments)    Pt states medication made her feel "out of it".   Pt states medication made her feel "out of it".      Objective:  There were no vitals filed for this visit. Constitutional Patient is a pleasant 70 y.o. African American female WD, WN in NAD. AAO x 3.  Vascular Capillary fill time to digits <3 seconds b/l lower extremities. Palpable pedal pulses b/l LE. Pedal hair present. Lower extremity skin temperature gradient within normal limits. No cyanosis or clubbing noted.  Neurologic Normal speech. Pt has subjective symptoms of neuropathy. Protective sensation intact 5/5 intact bilaterally with 10g monofilament b/l.  Dermatologic Pedal skin with normal turgor, texture and tone bilaterally. No open wounds bilaterally. No interdigital macerations bilaterally. Toenails 1-5 b/l elongated, discolored, dystrophic, thickened, crumbly with subungual debris and tenderness to dorsal palpation. Hyperkeratotic lesion(s) R 5th toe.  No erythema,  no edema, no drainage, no fluctuance.  Orthopedic: Normal muscle strength 5/5 to all lower extremity muscle groups bilaterally. No pain crepitus or joint limitation noted with ROM b/l. Hallux valgus with bunion deformity noted b/l lower extremities. Hammertoes noted to the R 5th toe.   Hemoglobin A1C Latest Ref Rng & Units 04/29/2021 12/30/2020 10/12/2020 07/03/2020  HGBA1C 4.8 - 5.6 % 8.0(H) 8.6(A) 10.5(A) 7.9(A)  Some recent data might be hidden   Assessment:   1. Pain due to onychomycosis of toenails of both feet   2. Corns   3. Bilateral ankle pain, unspecified chronicity   4. Pain in both lower extremities   5. Diabetic peripheral neuropathy associated with type 2 diabetes mellitus ([place])    Plan:  Patient was evaluated and treated and all questions answered.  Onychomycosis with pain -Nails palliatively debridement as below. -Educated on self-care  Procedure: Nail Debridement Rationale: Pain Type of Debridement: manual, sharp debridement. Instrumentation: Nail nipper, rotary burr. Number of Nails: 10  -Examined patient. -Continue diabetic foot care principles. -Patient to continue soft, supportive shoe gear daily. -Toenails 1-5 b/l were debrided in length and girth with sterile nail nippers and dremel without iatrogenic bleeding.  -Corn(s) R 5th toe pared utilizing sterile scalpel blade without complication or incident. Total number debrided=1. -Patient to report any pedal injuries to medical professional immediately. -Patient referred to Dr. Landis Martins for evaluation of bilateral ankle/leg pain. -Patient/POA to call should there be question/concern in the interim.  Return in about 3 months (around 07/24/2021).  Marzetta Board, DPM

## 2021-05-02 NOTE — Progress Notes (Signed)
Internal Medicine Clinic Attending  Case discussed with Dr. Sherry Ruffing  At the time of the visit.  We reviewed the resident's history and exam and pertinent patient test results.  I agree with the assessment, diagnosis, and plan of care documented in the resident's note.  Finding of AKI is likely prerenal in the clinical context of her recent illness, and Dr. Dorothyann Peng recommendations are appropriate.  If AKI persists after increasing fluid intake and withholding ACEI temporarily, further evaluation will be necessary at that time.

## 2021-05-03 ENCOUNTER — Other Ambulatory Visit (HOSPITAL_COMMUNITY): Payer: Self-pay

## 2021-05-04 ENCOUNTER — Other Ambulatory Visit (HOSPITAL_COMMUNITY): Payer: Self-pay

## 2021-05-04 ENCOUNTER — Other Ambulatory Visit: Payer: Self-pay | Admitting: Internal Medicine

## 2021-05-04 DIAGNOSIS — I10 Essential (primary) hypertension: Secondary | ICD-10-CM

## 2021-05-05 ENCOUNTER — Other Ambulatory Visit: Payer: Self-pay | Admitting: Student

## 2021-05-05 ENCOUNTER — Other Ambulatory Visit: Payer: Self-pay | Admitting: Internal Medicine

## 2021-05-05 ENCOUNTER — Other Ambulatory Visit (HOSPITAL_COMMUNITY): Payer: Self-pay

## 2021-05-05 DIAGNOSIS — I10 Essential (primary) hypertension: Secondary | ICD-10-CM

## 2021-05-05 MED ORDER — AMLODIPINE BESYLATE 10 MG PO TABS
ORAL_TABLET | Freq: Every day | ORAL | 2 refills | Status: DC
  Filled 2021-05-05 – 2021-05-13 (×2): qty 30, 30d supply, fill #0
  Filled 2021-06-17: qty 30, 30d supply, fill #1
  Filled 2021-07-26: qty 30, 30d supply, fill #2

## 2021-05-05 NOTE — Progress Notes (Signed)
Returned sample to stock. Pt never picked up.

## 2021-05-06 ENCOUNTER — Telehealth: Payer: Self-pay | Admitting: Student

## 2021-05-06 ENCOUNTER — Telehealth: Payer: Self-pay | Admitting: Gastroenterology

## 2021-05-06 NOTE — Telephone Encounter (Signed)
Pt dx with Covid on yesterday and wants to know what are her next steps.  Patient requesting a nurse to call back.

## 2021-05-06 NOTE — Telephone Encounter (Signed)
The pt was seen by [facility] please send to the [place]

## 2021-05-06 NOTE — Telephone Encounter (Signed)
COVID positive test. Cancelled the EGD for 05/11/21. Moved it to 06/07/21.

## 2021-05-06 NOTE — Telephone Encounter (Signed)
Called patient to remind her of her procedure and she wanted to know if there were any medications of hers she could not take? Please call patient she stated she may have other questions.

## 2021-05-06 NOTE — Telephone Encounter (Signed)
RTC, no answer, no VM, RN unable to leave message.  SChaplin, RN,BSN

## 2021-05-07 NOTE — Telephone Encounter (Signed)
RTC, patient states she has tested positive for Covid on Wednesday, 05/05/21.  She states her mother went to the doctor that day and tested positive, so patient took a Covid test and it was positive as well.  Patient states "I feel pretty good, it feels like my sinuses.  I've been having sinus problems for a while now and was in your office a couple of weeks ago for this".  She denies any SOB, no chest pain, no pain in legs. Patient had an office visit on 04/29/21 c/o a hx of 2 week cough, sinus pressure, h/a and congestion. Patient states when she was notified her test results came back positive she was told to quarantine for 5 days.  RN reiterated those instructions and also informed patient if she develops any SOB, chest pain, or pain in legs to present to ED for evaluation, she verbalized understanding. SChaplin, RN,BSN

## 2021-05-11 ENCOUNTER — Encounter: Payer: Medicare Other | Admitting: Gastroenterology

## 2021-05-12 ENCOUNTER — Other Ambulatory Visit (HOSPITAL_COMMUNITY): Payer: Self-pay

## 2021-05-13 ENCOUNTER — Ambulatory Visit (INDEPENDENT_AMBULATORY_CARE_PROVIDER_SITE_OTHER): Payer: Medicare Other | Admitting: Internal Medicine

## 2021-05-13 ENCOUNTER — Other Ambulatory Visit (HOSPITAL_COMMUNITY): Payer: Self-pay

## 2021-05-13 ENCOUNTER — Telehealth: Payer: Self-pay | Admitting: Student

## 2021-05-13 DIAGNOSIS — J0100 Acute maxillary sinusitis, unspecified: Secondary | ICD-10-CM | POA: Diagnosis not present

## 2021-05-13 DIAGNOSIS — J302 Other seasonal allergic rhinitis: Secondary | ICD-10-CM | POA: Diagnosis not present

## 2021-05-13 MED ORDER — CETIRIZINE HCL 5 MG PO TABS
5.0000 mg | ORAL_TABLET | Freq: Every day | ORAL | 1 refills | Status: DC
Start: 2021-05-13 — End: 2022-09-02
  Filled 2021-05-13: qty 30, 30d supply, fill #0

## 2021-05-13 NOTE — Telephone Encounter (Signed)
Pls contact pt [phone] regarding a COVID positive results

## 2021-05-13 NOTE — Telephone Encounter (Signed)
RTC, patient tested positive for Covid on 05/05/21, had symptoms for approx 2 weeks before testing positive.  She took another test today and reports it was still positive.  RN informed patient she could test positive for some time and this was nothing to be alarmed about.  Pt states she feels well, except for ongoing "sinus-like congestion w/ clear productive cough".  She would like to discuss the ongoing symptoms as she states she feels it is her sinus/allergies. Telehealth appt made for today w/ Dr. Sherry Ruffing at [phone] to discuss. Thank you, SChaplin, RN,BSN

## 2021-05-13 NOTE — Progress Notes (Signed)
  [facility] Internal Medicine Residency Telephone Encounter Continuity Care Appointment  HPI:   This telephone encounter was created for [name] on 05/13/2021 for the following purpose/cc cough, congestion, sneezing.  Seen in the clinic on 5/12 for symptoms of dry cough, sinus pressure, congestion, headaches, and bilateral ear pressure.  Felt to be a viral URI versus seasonal allergies and advised on conservative management with Flonase, rest, hydration, Tylenol as needed.  CBC and BMP at that time unremarkable.  Patient's mother had been recently diagnosed with COVID in she tested positive on 5/18, she had been feeling better at at that time, no treatment for COVID was done.  Since then she has been feeling better except for persistent sinus congestion and a clear productive cough. She is still having a wet and runny nose and a headache. Had some body sweats last week however nothing this week.  She denies any fevers, chills, nausea, vomiting, chest pain, shortness of breath, wheezing, lightheadedness, dizziness, or other symptoms. Eating and drinking okay. Drinking more water. Still holding the lisinopril in regards to her AKI.  She has been using tylenol and cough drops . Still using the Flonase once a day, 2 puffs each nostril. Sometimes she has some sneezing when she goes outside, but not much.    Past Medical History:  Past Medical History:  Diagnosis Date  . Allergy    SEASONAL  . Anemia   . Arthritis   . Cataract    PRESENT IN LEFT/ CATARACTS REMOVED FROM RIGHT  . Diabetes mellitus 1996  . DKA (diabetic ketoacidoses) 01/02/2013  . Hyperlipidemia   . Hypertension   . Hypotension   . Influenza A (H1N1) 01/03/2013  . Neuromuscular disorder ([place])    NEUROPATHY  . SYNCOPE 05/10/2010   Qualifier: Diagnosis of  By: Marilynne Halsted, RN, BSN, Jacquelyn        ROS:  Constitutional: Negative for chills and fever.  Respiratory: Negative for shortness of breath.    Positive for  sneezing, congestion, and cough. Cardiovascular: Negative for chest pain and leg swelling.  Gastrointestinal: Negative for abdominal pain, nausea and vomiting.   Neurological: Negative for dizziness and headaches.    Assessment / Plan / Recommendations:   Please see A&P under problem oriented charting for assessment of the patient's acute and chronic medical conditions.   As always, pt is advised that if symptoms worsen or new symptoms arise, they should go to an urgent care facility or to to ER for further evaluation.   Consent and Medical Decision Making:   Patient discussed with Dr. Philipp Ovens  This is a telephone encounter between Amanda [name] and Asencion Noble on 05/13/2021 for cough, congestion, sneezing. The visit was conducted with the patient located at home and Asencion Noble at [facility]. The patient's identity was confirmed using their DOB and current address. The patient has consented to being evaluated through a telephone encounter and understands the associated risks (an examination cannot be done and the patient may need to come in for an appointment) / benefits (allows the patient to remain at home, decreasing exposure to coronavirus). I personally spent 10 minutes on medical discussion.

## 2021-05-14 NOTE — Assessment & Plan Note (Addendum)
Seen in the clinic on 5/12 for symptoms of dry cough, sinus pressure, congestion, headaches, and bilateral ear pressure.  Felt to be a viral URI versus seasonal allergies and advised on conservative management with Flonase, rest, hydration, Tylenol as needed.  CBC and BMP at that time unremarkable.  Patient's mother had been recently diagnosed with COVID in she tested positive on 5/18, she had been feeling better at at that time, no treatment for COVID was done.  Since then she has been feeling better except for persistent sinus congestion and a clear productive cough. She is still having a wet and runny nose and a headache. Had some body sweats last week however nothing this week. She denies any fevers, chills, nausea, vomiting, chest pain, shortness of breath, wheezing, lightheadedness, dizziness, or other symptoms. Eating and drinking okay. Drinking more water. Still holding the lisinopril in regards to her AKI. She has been using tylenol and cough drops . Still using the Flonase once a day, 2 puffs each nostril. Sometimes she has some sneezing when she goes outside, but not much.  Differential includes viral URI possibly secondary to COVID versus seasonal allergies.  We will continue supportive care and have patient restart her Zyrtec.  Went over a Neti pot and advised patient to pick this up and start using it daily.  -Continue Flonase daily -Continue Tylenol as needed -Restart Zyrtec daily -Advised on supportive measures -Advised patient on return precautions

## 2021-05-19 ENCOUNTER — Encounter: Payer: Self-pay | Admitting: Dietician

## 2021-05-19 ENCOUNTER — Encounter: Payer: Self-pay | Admitting: Student

## 2021-05-19 ENCOUNTER — Ambulatory Visit (INDEPENDENT_AMBULATORY_CARE_PROVIDER_SITE_OTHER): Payer: Medicare Other | Admitting: Dietician

## 2021-05-19 ENCOUNTER — Other Ambulatory Visit (HOSPITAL_COMMUNITY): Payer: Self-pay

## 2021-05-19 ENCOUNTER — Ambulatory Visit (INDEPENDENT_AMBULATORY_CARE_PROVIDER_SITE_OTHER): Payer: Medicare Other | Admitting: Student

## 2021-05-19 ENCOUNTER — Other Ambulatory Visit: Payer: Self-pay

## 2021-05-19 VITALS — BP 148/60 | HR 75 | Temp 97.9°F

## 2021-05-19 DIAGNOSIS — R197 Diarrhea, unspecified: Secondary | ICD-10-CM | POA: Diagnosis not present

## 2021-05-19 DIAGNOSIS — E118 Type 2 diabetes mellitus with unspecified complications: Secondary | ICD-10-CM

## 2021-05-19 DIAGNOSIS — E611 Iron deficiency: Secondary | ICD-10-CM | POA: Diagnosis not present

## 2021-05-19 DIAGNOSIS — Z794 Long term (current) use of insulin: Secondary | ICD-10-CM

## 2021-05-19 DIAGNOSIS — E113513 Type 2 diabetes mellitus with proliferative diabetic retinopathy with macular edema, bilateral: Secondary | ICD-10-CM | POA: Diagnosis not present

## 2021-05-19 DIAGNOSIS — N179 Acute kidney failure, unspecified: Secondary | ICD-10-CM | POA: Diagnosis not present

## 2021-05-19 DIAGNOSIS — I10 Essential (primary) hypertension: Secondary | ICD-10-CM

## 2021-05-19 DIAGNOSIS — Z713 Dietary counseling and surveillance: Secondary | ICD-10-CM | POA: Diagnosis not present

## 2021-05-19 MED ORDER — METFORMIN HCL 1000 MG PO TABS
1000.0000 mg | ORAL_TABLET | Freq: Two times a day (BID) | ORAL | 3 refills | Status: DC
  Filled 2021-05-19: qty 90, 45d supply, fill #0

## 2021-05-19 MED ORDER — IRON 325 (65 FE) MG PO TABS: 325.0000 mg | ORAL_TABLET | Freq: Every day | ORAL | 0 refills | Status: DC

## 2021-05-19 MED ORDER — TRULICITY 0.75 MG/0.5ML ~~LOC~~ SOAJ
0.7500 mg | SUBCUTANEOUS | 0 refills | Status: DC
Start: 2021-05-19 — End: 2021-07-08
  Filled 2021-05-19 (×2): qty 2, 28d supply, fill #0

## 2021-05-19 MED FILL — Metformin HCl Tab 1000 MG: ORAL | 30 days supply | Qty: 60 | Fill #1 | Status: CN

## 2021-05-19 NOTE — Assessment & Plan Note (Signed)
Patient's hemoglobin a1c is 8.0 today. Patient is taking metformin 1011m twice daily and lantus 4 units twice daily. Patient has blood glucose log with her with average reading of 142, standard deviation of 64.1, lowest reading of 69, highest of 315. Patient has met with DDebera Latwith diabetes education provided and CGM applied to left arm. Patient is concerned about increased appetite and six pound weight gain with improved control of her diabetes. Patient may benefit from discontinuation of lantus and starting GLP1 agonist with plan to titrate up as tolerated.  -Start trulicity 01.82EQweekly -Continue lantus 8 units daily until obtains trulicity, then discontinue lantus -Continue metformin 1,0048mtwice daily (refilled)

## 2021-05-19 NOTE — Assessment & Plan Note (Addendum)
Patient's blood pressure 148/60 today in the setting of holding home lisinopril 10mg  daily with continuation of chlorthalidone 25mg  and amlodipine 10mg  daily. -Obtain BMP -Continue holding lisinopril while awaiting BMP results -Continue amlodipine 10mg  daily -Continue chlorthalidone 25mg  daily  ADDENDUM: Patient's AKI resolved, instructed to resume lisinopril

## 2021-05-19 NOTE — Progress Notes (Signed)
Medical Nutrition Therapy:  Appt start time: 1:25 end time: 1:55 time: 30 minutes 10 minutes for CGM placment Visit # 6  Assessment: Patient's primary concern today was her chronic diarrhea and her diabetes.  We discussed how lactaid pills work and what foods contain lactose. She also wanted to know about Continuous glucose monitoring And diabetes medicines today. Sh does not feel that the lantus is working well for her even taking 4 units two times a day. She says it makes her hungry and she is gaining weight and feeling swollen "like she needs a fluid pill".  Her current weight is at the ideal amount for her age. She requested a professional CGM placement today.  ANTHROPOMETRICS: Estimated body mass index is 26.83 kg/m as calculated from the following:   Height as of 01/20/21: 5\' 2"  (1.575 m).   Weight as of this encounter: 146 lb 11.2 oz (66.5 kg). Wt Readings from Last 10 Encounters:  05/19/21 146 lb 11.2 oz (66.5 kg)  04/29/21 140 lb 3.2 oz (63.6 kg)  03/18/21 138 lb (62.6 kg)  01/27/21 140 lb 1.6 oz (63.5 kg)  01/20/21 140 lb 14.4 oz (63.9 kg)  01/20/21 140 lb 14.4 oz (63.9 kg)  01/12/21 140 lb 6.4 oz (63.7 kg)  12/30/20 141 lb (64 kg)  10/28/20 129 lb 4.8 oz (58.7 kg)  10/12/20 119 lb 12.8 oz (54.3 kg)   Documentation for Freestyle Libre Pro Continuous glucose monitoring Freestyle Libre Pro CGM sensor placed today. Patient was educated about wearing sensor, keeping food, activity and medication log and when to call office. Patient was educated about how to care for the sensor and not to have an MRI, CT or Diathermy while wearing the sensor. Follow up was arranged with the patient for 1 week.   Lot #: [id number] Serial #: [id number] Expiration Date: 08/18/21  [name], RD 05/19/2021 4:55 PM.   MEDICATIONS: Lantus 4 units tow times daily, metformin 1000 mg two times a day  BLOOD SUGAR: 30 day reports shows average 142, range 69-315, pattern of low in the am and elevated in the  PM- but also with less testing frequency in the Pms.   DIETARY INTAKE: reports heavier food intake that she feels is due to insulin.   Usual physical activity: Not discussed  Progress Towards Goal(s):  Some progress.   Nutritional Diagnosis:  NB-1.1 Food and nutrition-related knowledge deficit as related to continued intake of potential GI irritating foods improving as evidenced by her report of decreased intake of dairy and improved diarrhea and abdominal soreness    Intervention:  Nutrition education about lactase enzyme pills and how they work, Continuous glucose monitoring and diabetes medicinces Action Goal: keep food diary and bring it with her next week, try to omit lactose or take Lactaid pills when she eats it   Outcome goal: improved blood sugars, improved GI distress Coordination of care: Discuss low blood sugars and Continuous glucose monitoring with her doctor  Teaching Method Utilized: Visual, Auditory Handouts given during visit include: After visit summary  Barriers to learning/adherence to lifestyle change: competing values Demonstrated degree of understanding via:  Teach Back   Monitoring/Evaluation:  Dietary intake, GI symptoms, meter, and body weight in 1 week(s). [name], RD 05/19/2021 4:42 PM.

## 2021-05-19 NOTE — Patient Instructions (Addendum)
  Use 1-2 tablets with diary products- milk, cereal, yogurt, ice cream, pudding So good to hear your diarrhea is improving!     Continuous glucose monitoring  Please record the time, amount and what food drinks and activities you have while wearing the continuous glucose monitor (CGM).  Bring the folder with you to follow up appointments. If your monitor falls off, please place it in the bag provided in your folder and bring it back with you to your next appointment.   Do not have a CT or an MRI while wearing the CGM.   1 week visit has been set up with me and a doctor for the first of two CGM downloads.   You will also return in 2 weeks to have your second download and the CGM removed.  Butch Penny [phone]

## 2021-05-19 NOTE — Progress Notes (Signed)
   CC: 2-week follow-up for acute kidney injury  HPI:  Amanda [name] is a 70 y.o. with past medical history significant for T2DM, hypertension and recent COVID-19 infection (diagnosed 64/68/03) complicated by acute kidney injury. Refer to problem list for charting of this encounter.  Past Medical History:  Diagnosis Date  . Allergy    SEASONAL  . Anemia   . Arthritis   . Cataract    PRESENT IN LEFT/ CATARACTS REMOVED FROM RIGHT  . Diabetes mellitus 1996  . DKA (diabetic ketoacidoses) 01/02/2013  . Hyperlipidemia   . Hypertension   . Hypotension   . Influenza A (H1N1) 01/03/2013  . Neuromuscular disorder ([place])    NEUROPATHY  . SYNCOPE 05/10/2010   Qualifier: Diagnosis of  By: Marilynne Halsted RN, BSN, Jacquelyn     Review of Systems:  Endorses weight gain, increased appetite. Denies cough, congestion, fevers, chills, shortness of breath, chest pain.  Physical Exam:  Vitals:   05/19/21 1427  BP: (!) 148/60  Pulse: 75  Temp: 97.9 F (36.6 C)  TempSrc: Oral  SpO2: 100%   Physical Exam Vitals reviewed.  Constitutional:      General: She is not in acute distress.    Appearance: She is not ill-appearing.  Cardiovascular:     Rate and Rhythm: Normal rate and regular rhythm.     Pulses: Normal pulses.     Heart sounds: Normal heart sounds.  Pulmonary:     Effort: Pulmonary effort is normal.     Breath sounds: Normal breath sounds.  Abdominal:     General: Abdomen is flat. Bowel sounds are normal.     Palpations: Abdomen is soft.     Tenderness: There is no abdominal tenderness.  Musculoskeletal:        General: Normal range of motion.     Right lower leg: No edema.     Left lower leg: No edema.  Skin:    General: Skin is warm and dry.     Capillary Refill: Capillary refill takes less than 2 seconds.     Comments: Normal skin turgor  Neurological:     Mental Status: She is alert.     Assessment & Plan:   See Encounters Tab for problem based charting.  Patient  discussed with Dr. Jimmye Norman

## 2021-05-19 NOTE — Assessment & Plan Note (Addendum)
Patient noted to have acute kidney injury with elevation in creatinine to 2.14 from baseline of approximately 1.3-1.4. This was noted in the setting of active COVID with concern for pre-renal etiology. Patient instructed to discontinue lisinopril and drink more water. Patient reports adherence to these recommendations since last office visit. -Obtain BMP -Continue holding lisinopril, will restart if acute kidney injury is resolved  ADDENDUM: AKI resolved, plan to resume lisinopril

## 2021-05-19 NOTE — Patient Instructions (Addendum)
Amanda [name],  It was a pleasure seeing you in clinic today.  For your diabetes: Please stop taking the lantus and start taking a medication called Trulicity which is a once weekly injection. Please continue taking metformin 1,000mg  twice daily.   For your high blood pressure: Please do not take the lisinopril until I call you with the results of your lab work.  Sincerely, Dr. Paulla Dolly, MD

## 2021-05-20 ENCOUNTER — Other Ambulatory Visit (HOSPITAL_COMMUNITY): Payer: Self-pay

## 2021-05-20 LAB — BMP8+ANION GAP
Anion Gap: 13 mmol/L (ref 10.0–18.0)
BUN/Creatinine Ratio: 26 (ref 12–28)
BUN: 32 mg/dL — ABNORMAL HIGH (ref 8–27)
CO2: 22 mmol/L (ref 20–29)
Calcium: 9.2 mg/dL (ref 8.7–10.3)
Chloride: 104 mmol/L (ref 96–106)
Creatinine, Ser: 1.24 mg/dL — ABNORMAL HIGH (ref 0.57–1.00)
Glucose: 85 mg/dL (ref 65–99)
Potassium: 4.8 mmol/L (ref 3.5–5.2)
Sodium: 139 mmol/L (ref 134–144)
eGFR: 47 mL/min/{1.73_m2} — ABNORMAL LOW (ref >59)

## 2021-05-20 MED FILL — Atorvastatin Calcium Tab 40 MG (Base Equivalent): ORAL | 90 days supply | Qty: 90 | Fill #0 | Status: AC

## 2021-05-22 NOTE — Progress Notes (Signed)
Internal Medicine Clinic Attending ° °Case discussed with Dr. Krienke  At the time of the visit.  We reviewed the resident’s history and exam and pertinent patient test results.  I agree with the assessment, diagnosis, and plan of care documented in the resident’s note.  °

## 2021-05-23 ENCOUNTER — Encounter: Payer: Self-pay | Admitting: *Deleted

## 2021-05-24 ENCOUNTER — Other Ambulatory Visit (HOSPITAL_COMMUNITY): Payer: Self-pay

## 2021-05-25 ENCOUNTER — Telehealth: Payer: Self-pay | Admitting: Podiatry

## 2021-05-25 NOTE — Telephone Encounter (Signed)
Pt left message checking on diabetic shoes status as she has not heard anything.  I returned call and explained we have not received the paperwork for the diabetic shoes from pcp and it was faxed 6 times. Pt states she has an appt tomorrow and will ask about it. I refaxed it today with a note stating pt has the appt tomorrow.   She also asked what shoe she was to be getting and I told her it was the Karle Starch one that she chose and she said chose a tennis shoe as well. I told pt medicare only covers 1 pr shoes and 3 pr inserts yearly. And if the 1st choice(tennis shoe) was out of stock we would go with second choice. She also asked about wearing the inserts in other shoes and I told her it would be ok as long as the shoes were deep enough to fit the inserts and not crowd her feet. I also told pt if she would like to purchase a pair of shoes as self pay the cost is 150.00

## 2021-05-26 ENCOUNTER — Ambulatory Visit (INDEPENDENT_AMBULATORY_CARE_PROVIDER_SITE_OTHER): Payer: Medicare Other | Admitting: Dietician

## 2021-05-26 ENCOUNTER — Other Ambulatory Visit: Payer: Self-pay

## 2021-05-26 ENCOUNTER — Encounter: Payer: Self-pay | Admitting: Dietician

## 2021-05-26 ENCOUNTER — Encounter: Payer: Self-pay | Admitting: Student

## 2021-05-26 ENCOUNTER — Ambulatory Visit (INDEPENDENT_AMBULATORY_CARE_PROVIDER_SITE_OTHER): Payer: Medicare Other | Admitting: Student

## 2021-05-26 ENCOUNTER — Other Ambulatory Visit (HOSPITAL_COMMUNITY): Payer: Self-pay

## 2021-05-26 VITALS — BP 105/62 | HR 69 | Temp 98.4°F | Ht 61.5 in | Wt 147.7 lb

## 2021-05-26 DIAGNOSIS — Z713 Dietary counseling and surveillance: Secondary | ICD-10-CM

## 2021-05-26 DIAGNOSIS — I10 Essential (primary) hypertension: Secondary | ICD-10-CM | POA: Diagnosis not present

## 2021-05-26 DIAGNOSIS — E11649 Type 2 diabetes mellitus with hypoglycemia without coma: Secondary | ICD-10-CM | POA: Diagnosis not present

## 2021-05-26 DIAGNOSIS — E118 Type 2 diabetes mellitus with unspecified complications: Secondary | ICD-10-CM

## 2021-05-26 DIAGNOSIS — Z794 Long term (current) use of insulin: Secondary | ICD-10-CM

## 2021-05-26 DIAGNOSIS — R197 Diarrhea, unspecified: Secondary | ICD-10-CM

## 2021-05-26 DIAGNOSIS — Z7984 Long term (current) use of oral hypoglycemic drugs: Secondary | ICD-10-CM

## 2021-05-26 MED ORDER — CHLORTHALIDONE 25 MG PO TABS
12.5000 mg | ORAL_TABLET | Freq: Every day | ORAL | 0 refills | Status: DC
  Filled 2021-05-26 – 2021-06-17 (×2): qty 45, 90d supply, fill #0

## 2021-05-26 MED ORDER — SYNJARDY 5-1000 MG PO TABS
1.0000 | ORAL_TABLET | Freq: Two times a day (BID) | ORAL | 0 refills | Status: DC
  Filled 2021-05-26: qty 60, 30d supply, fill #0

## 2021-05-26 NOTE — Assessment & Plan Note (Addendum)
Patient presents to clinic for 1-week follow-up of CGM. Since her last appointment, she has been taking metformin 1000mg  twice daily and lantus 4 units in the morning and 4 units in the evening. She was unable to start taking Trulicity as the medication was too expensive. Patient does describe one episode of feeling dizzy with sweating and itching at which time she checked her blood glucose level which was in the 50s. Review of her CGM report reveals an average blood glucose reading of 109 with 16% low and 9% very low. At this time, patient continuing lantus likely provides more risk than benefit and she would benefit from discontinuation of this agent. Patient would benefit from escalation of her oral medication regimen and continued efforts to obtain GLP1 agonist given her concern about weight gain. Patient has been making great effort with improving her diet which is obvious based on her report. -Start empagliflozin-metformin 5-1000mg  twice daily with meals -Discontinue metformin 1000mg  twice daily -Discontinue lantus 4 units twice daily -Continue working with clinic to obtain financial assistance for [employer], however patient may not need this medication if empagliflozin-metformin and diet effectively control her condition  ADDENDUM: Contacted by patient that Iva Boop is approximately $140 monthly. Contacted [facility] who reports that patient will need to call member benefits for Medicare to discuss options, [facility] will also run test claims and notify me of a cheaper alternative if identified

## 2021-05-26 NOTE — Patient Instructions (Addendum)
Amanda [name],  It was a pleasure meeting you today.  We recommend the following changes for your diabetes: -Stop taking the lantus -Pick up new prescription of empagliflozin-metformin (Synjardy) 5-1000mg  twice daily from the [facility] -Once you have Synjardy, stop taking the metformin -Continue working with our clinic to get approval for the Trulicity  For your blood pressure: -Start taking a half tablet of chlorthalidone every day -Continue taking amlodipine 10mg  daily -Continue taking lisinopril 10mg  daily  Sincerely, Dr. Paulla Dolly, MD

## 2021-05-26 NOTE — Progress Notes (Signed)
Chart reviewed, case closure due to inability to make contact noted.

## 2021-05-26 NOTE — Patient Instructions (Signed)
Your blood sugars were very well controlled with an average of 109 over the past week. However, the Continuous glucose monitoring also showed a pattern of low blood sugars as we discussed.  Dr. Wynetta Emery will talk to you about that.   Keep recording your food intake and bring the log with you next week.   Keep eating all those veggies, lean meats and fruits.  Good job getting LACTAID TABLETS?  Try keeping them out in clear sight in the kitchen area to help you to remember to take then when you eat dairy- milk, ice cream, pudding, yogurt or cheese  Amire Leazer [phone]

## 2021-05-26 NOTE — Progress Notes (Signed)
Internal Medicine Clinic Attending  Case discussed with Dr. Johnson  At the time of the visit.  We reviewed the resident's history and exam and pertinent patient test results.  I agree with the assessment, diagnosis, and plan of care documented in the resident's note.  

## 2021-05-26 NOTE — Progress Notes (Signed)
   CC: CGM 1-week  HPI:  Amanda [name] is a 70 y.o. with type 2 diabetes mellitus and hypertension who presents to clinic for 1-week review of CGM. Refer to problem list for charting of this encounter.  Past Medical History:  Diagnosis Date  . Allergy    SEASONAL  . Anemia   . Arthritis   . Cataract    PRESENT IN LEFT/ CATARACTS REMOVED FROM RIGHT  . Diabetes mellitus 1996  . DKA (diabetic ketoacidoses) 01/02/2013  . Hyperlipidemia   . Hypertension   . Hypotension   . Influenza A (H1N1) 01/03/2013  . Neuromuscular disorder ([place])    NEUROPATHY  . SYNCOPE 05/10/2010   Qualifier: Diagnosis of  By: Marilynne Halsted, RN, BSN, Jacquelyn     Review of Systems:  Endorses one episode of dizziness associated with hypoglycemia. Denies falls, syncope, chest pain, shortness of breath, polyuria, polydipsia, fevers, chills.  Physical Exam:  Vitals:   05/26/21 1353  BP: 105/62  Pulse: 69  Temp: 98.4 F (36.9 C)  TempSrc: Oral  SpO2: 100%  Weight: 147 lb 11.2 oz (67 kg)  Height: 5' 1.5" (1.562 m)   Physical Exam Vitals reviewed.  Constitutional:      General: She is not in acute distress.    Appearance: She is not ill-appearing.  Cardiovascular:     Rate and Rhythm: Normal rate and regular rhythm.     Pulses: Normal pulses.     Heart sounds: Normal heart sounds.  Pulmonary:     Effort: Pulmonary effort is normal.     Breath sounds: Normal breath sounds.  Abdominal:     General: Abdomen is flat. Bowel sounds are normal.     Palpations: Abdomen is soft.     Tenderness: There is no abdominal tenderness.  Neurological:     Mental Status: She is alert.  Psychiatric:        Mood and Affect: Mood normal.        Behavior: Behavior normal.    Assessment & Plan:   See Encounters Tab for problem based charting.  Patient discussed with Dr. Jimmye Norman

## 2021-05-26 NOTE — Progress Notes (Signed)
Medical Nutrition Therapy:  Appt start time: 1:25 end time: 1:55 time: 30 minutes 10 minutes for CGM download Visit # 7  Assessment: Patient's primary concern today was her chronic diarrhea and professional Continuous glucose monitoring Download.    She says she thinks splitting the lantus into two injection helped better control her blood sugars. She is often feeling hungry and her weight is increasing .She states she has ongoing loose stools/diarrhea. She did not start Trulicty because it was too expensive. Her CGM report shows excellent blood sugar control with significant hypoglycemia.  ANTHROPOMETRICS: Estimated body mass index is 27.01 kg/m as calculated from the following:   Height as of 01/20/21: 5\' 2"  (1.575 m).   Weight as of this encounter: 147 lb 11.2 oz (67 kg). Wt Readings from Last 10 Encounters:  05/26/21 147 lb 11.2 oz (67 kg)  05/19/21 146 lb 11.2 oz (66.5 kg)  04/29/21 140 lb 3.2 oz (63.6 kg)  03/18/21 138 lb (62.6 kg)  01/27/21 140 lb 1.6 oz (63.5 kg)  01/20/21 140 lb 14.4 oz (63.9 kg)  01/20/21 140 lb 14.4 oz (63.9 kg)  01/12/21 140 lb 6.4 oz (63.7 kg)  12/30/20 141 lb (64 kg)  10/28/20 129 lb 4.8 oz (58.7 kg)    MEDICATIONS: Lantus 4 units two times daily at 1030 am and 1030 PM, metformin 1000 mg two times a day BLOOD SUGAR: she forgot her meter, CGM Results from download:   Average glucose:   109 mg/dL for 7 days  Glucose management indicator:   5.9 %  Time in range (70-180 mg/dL):   64 %   (Goal >70%)  Time High (181-250 mg/dL):   10 %   (Goal < 25%)  Time Very High (>250 mg/dL):    1 %   (Goal < 5%)  Time Low (54-69 mg/dL):   16 %   (Goal <4%)  Time Very Low (<54 mg/dL):   9 %   (Goal <1%)  Coefficient of variation:    45.8 %   (Goal <36%)    DIETARY INTAKE:24-hr recall:  (Up at  5AM and is hungry but ignores it and goes back to bed until ~ 9 am B ( AM)- a sausage biscuit 1-2 times a week, 3 oz juice 1-2 times a week   L (1230 PM)- 2 tablespoons potato  salad, baked chicken, greens, squash  D (5-8 PM)- leftover squash and greens Drinks only water with very little juice a few times a week  Typical day? Yes.     Usual physical activity: Not discussed  Progress Towards Goal(s):  Some progress.   Nutritional Diagnosis:  NB-1.1 Food and nutrition-related knowledge deficit as related to continued intake of potential GI irritating foods improving as evidenced by her report of decreased intake of dairy and improved diarrhea and abdominal soreness    Intervention:  Nutrition education about Continuous glucose monitoring download report and diabetes medicinces Action Goal: keep food diary and bring it with her next week, try to omit lactose or take Lactaid pills when she eats it   Outcome goal: improved blood sugars, improved GI distress Coordination of care: Discuss low blood sugars and Continuous glucose monitoring with her doctor  Teaching Method Utilized: Visual, Auditory Handouts given during visit include: After visit summary  Barriers to learning/adherence to lifestyle change: competing values Demonstrated degree of understanding via:  Teach Back   Monitoring/Evaluation:  Dietary intake, GI symptoms, meter, and body weight in 1 week(s). [name], [place] 05/26/2021 2:07  PM.

## 2021-05-26 NOTE — Assessment & Plan Note (Signed)
Patient's blood pressure at today's visit 105/62 following resumption of lisinopril 10mg  daily and continuation of amlodipine 10mg  daily and chlorthalidone 25mg  daily. Patient may be able to effectively de-escalate her current regimen with close monitoring. -Decrease chlorthalidone from 25mg  daily to 12.5mg  daily -Continue amlodipine 10mg  daily -Continue lisinopril 10mg  daily

## 2021-05-27 ENCOUNTER — Encounter: Payer: Self-pay | Admitting: Sports Medicine

## 2021-05-27 ENCOUNTER — Ambulatory Visit (INDEPENDENT_AMBULATORY_CARE_PROVIDER_SITE_OTHER): Payer: Medicare Other

## 2021-05-27 ENCOUNTER — Other Ambulatory Visit: Payer: Self-pay | Admitting: Sports Medicine

## 2021-05-27 ENCOUNTER — Ambulatory Visit (INDEPENDENT_AMBULATORY_CARE_PROVIDER_SITE_OTHER): Payer: Medicare Other | Admitting: Sports Medicine

## 2021-05-27 DIAGNOSIS — M2011 Hallux valgus (acquired), right foot: Secondary | ICD-10-CM

## 2021-05-27 DIAGNOSIS — M79604 Pain in right leg: Secondary | ICD-10-CM

## 2021-05-27 DIAGNOSIS — M25571 Pain in right ankle and joints of right foot: Secondary | ICD-10-CM

## 2021-05-27 DIAGNOSIS — M25572 Pain in left ankle and joints of left foot: Secondary | ICD-10-CM | POA: Diagnosis not present

## 2021-05-27 DIAGNOSIS — E1142 Type 2 diabetes mellitus with diabetic polyneuropathy: Secondary | ICD-10-CM | POA: Diagnosis not present

## 2021-05-27 DIAGNOSIS — E1149 Type 2 diabetes mellitus with other diabetic neurological complication: Secondary | ICD-10-CM

## 2021-05-27 DIAGNOSIS — I739 Peripheral vascular disease, unspecified: Secondary | ICD-10-CM

## 2021-05-27 DIAGNOSIS — M2012 Hallux valgus (acquired), left foot: Secondary | ICD-10-CM

## 2021-05-27 DIAGNOSIS — M79605 Pain in left leg: Secondary | ICD-10-CM

## 2021-05-27 NOTE — Progress Notes (Signed)
g

## 2021-05-27 NOTE — Progress Notes (Signed)
Subjective: Amanda [name] is a 70 y.o. female patient who presents to office for evaluation of bilateral leg and ankle pain states that there is a constant achy tingling heavy feeling to both legs from the feet all the way up to the mid shin most noticeable in the legs dates that sometimes she does have some soreness that is in her back but most of the soreness is in her legs states that she has started wearing compression garments that seems to help but when she takes them off the pain is worse at night.  Patient reports that this has been going on for the last 4 to 5 months and has also tried wrist massage and Tylenol.  Patient denies any other pedal complaints at this time.  Patient is diabetic blood sugar not recorded.  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   Pain of both shoulder joints 11/14/2016   Vitreous hemorrhage of left eye (HCC) 05/13/2016   Osteopenia 05/04/2016   Posterior capsular opacification of left eye, obscuring vision 02/15/2016   MDD (major depressive disorder), recurrent episode, moderate (HCC) 02/08/2016   Pseudophakia of both eyes 07/27/2015   Diabetic neuropathy (HCC) 05/20/2013   Seasonal allergies 03/12/2013   Dyslipidemia 02/15/2013   DM II (diabetes mellitus, type II), controlled (HCC) 02/14/2013   Normocytic anemia 02/14/2013   Essential hypertension 05/10/2010    Current Outpatient Medications on File Prior to Visit  Medication Sig Dispense Refill   Accu-Chek Softclix Lancets lancets USE AS DIRECTED UP TO 3 TIMES DAILY (Patient taking differently: USE AS DIRECTED UP TO 3 TIMES DAILY) 300 each 3   acetaminophen  (TYLENOL) 325 MG tablet Take 2 tablets (650 mg total) by mouth every 6 (six) hours as needed (or Fever >/= 101). 60 tablet 0   amLODipine (NORVASC) 10 MG tablet TAKE 1 TABLET (10 MG TOTAL) BY MOUTH DAILY. 30 tablet 2   atorvastatin (LIPITOR) 40 MG tablet TAKE 1 TABLET (40 MG TOTAL) BY MOUTH DAILY AT 6 PM. 90 tablet 1   blood glucose meter kit and supplies KIT Dispense based on patient and insurance preference. Use up to four times daily as directed. . 1 each 0   cetirizine (ZYRTEC) 5 MG tablet Take 1 tablet (5 mg total) by mouth daily. 30 tablet 1   chlorthalidone (HYGROTON) 25 MG tablet Take 0.5 tablets (12.5 mg total) by mouth daily. 45 tablet 0   Cholecalciferol (VITAMIN D3) 2000 units capsule Take by mouth daily.      diclofenac Sodium (VOLTAREN) 1 % GEL Apply topically.     dicyclomine (BENTYL) 10 MG capsule TAKE 1 CAPSULE TWICE A DAY AS NEEDED FOR ABDOMINAL CRAMPING. 90 capsule 0   Dulaglutide (TRULICITY) 0.75 MG/0.5ML SOPN Inject 0.75 mg into the skin once a week. 2 mL 0   Empagliflozin-metFORMIN HCl (SYNJARDY) 04-999 MG TABS Take 1 tablet by mouth 2 (two) times daily with a meal. 60 tablet 0   Ferrous Sulfate (IRON) 325 (65 Fe) MG TABS Take 1 tablet (325 mg total) by mouth daily. 30 tablet 0   fluticasone (FLONASE) 50 MCG/ACT nasal spray PLACE 2 SPRAYS INTO BOTH NOSTRILS DAILY. 16 g 2     glucose blood test strip CHECK BLOOD SUGAR UP TO 3 TIMES PER DAY (Patient taking differently: CHECK BLOOD SUGAR UP TO 3 TIMES PER DAY) 300 strip 3   Insulin Pen Needle 32G X 4 MM MISC Use to inject insulin daily 100 each 3   Insulin Pen Needle 32G X 4 MM MISC USE AS DIRECTED DAILY 100 each 0   latanoprost (XALATAN) 0.005 % ophthalmic solution 1 drop at bedtime.     lisinopril (ZESTRIL) 10 MG tablet TAKE 1 TABLET (10 MG TOTAL) BY MOUTH DAILY. 90 tablet 3   Multiple Vitamins-Minerals (WOMENS 50+ MULTI VITAMIN/MIN) TABS Take 1 tablet by mouth daily. 30 tablet    [DISCONTINUED] glipiZIDE (GLUCOTROL) 5 MG tablet  Take 0.5 tablets (2.5 mg total) by mouth daily before breakfast. 15 tablet 0   No current facility-administered medications on file prior to visit.    Allergies  Allergen Reactions   Gabapentin Other (See Comments)    Pt states medication made her feel "out of it".   Pt states medication made her feel "out of it".      Objective:  General: Alert and oriented x3 in no acute distress  Dermatology: No open lesions bilateral lower extremities, no webspace macerations, no ecchymosis bilateral, all nails x 10 are well manicured.  Vascular: Dorsalis Pedis and Posterior Tibial pedal pulses palpable, Capillary Fill Time 3 seconds,(+) pedal hair growth bilateral, no edema bilateral lower extremities, Temperature gradient within normal limits.  There is superficial varicosities noted bilateral lower legs.  Neurology: Gross sensation intact via light touch bilateral, subjective tingling to both feet however protective sensation is intact bilateral.  Musculoskeletal: Diffuse generalized tenderness to palpation to anterior shin and along the great saphenous course bilateral there is no significant warmth that is concerning for DVT or no significant swelling.  Patient does have underlying flatfeet and bunions but there is no symptomatic pain to these areas currently.  Gait: NonAntalgic gait  Xrays  Right and left foot   Impression: Midtarsal breech supportive of pes planus deformity there is increase in intermetatarsal angle supportive of bunion deformity no fracture or dislocation very minimal joint space narrowing no major concerns of arthritis.  Assessment and Plan: Problem List Items Addressed This Visit   None Visit Diagnoses     Bilateral ankle pain, unspecified chronicity    -  Primary   Relevant Orders   DG Ankle Complete Right   DG Ankle Complete Left   Bilateral leg pain       Diabetic peripheral neuropathy associated with type 2 diabetes mellitus (HCC)       Hallux valgus,  acquired, bilateral       PVD (peripheral vascular disease) (HCC)       Relevant Orders   Ambulatory referral to Vascular Surgery        -Complete examination performed -Xrays reviewed -Discussed treatement options for likely pain in legs related to PVD versus worsening neuropathy -Rx referral to vein clinic for further evaluation and second opinion to see if the pain could be related to her PVD -Meanwhile advised patient to continue with compression garments since they help -Advised patient to use a topical pain cream or rub over-the-counter on both legs -Patient to return to office after vascular or sooner if condition worsens.   , DPM   

## 2021-05-27 NOTE — Patient Instructions (Signed)
OTC voltaren, aspercreme or biofreeze on legs at night after removing compressoin stockings

## 2021-05-28 ENCOUNTER — Telehealth: Payer: Self-pay | Admitting: Student

## 2021-05-28 ENCOUNTER — Other Ambulatory Visit (HOSPITAL_COMMUNITY): Payer: Self-pay

## 2021-05-28 NOTE — Telephone Encounter (Signed)
Contacted Ms. Petraitis by phone to discuss medication pricing. Advised patient that [facility] is running tests on SGLT-2 options to find the cheapest alternative available. Advised patient to continue taking metformin 1000mg  twice daily and to monitor sugars closely over the weekend. If blood sugars remain persistently elevated then she can consider using lantus once daily. Patient is currently awaiting coverage for GLP 1 agonist. Referral placed to Dr. Georgina Peer for further assistance regarding affordable oral or weekly injection options to allow her to transition off of daily insulin.

## 2021-05-28 NOTE — Telephone Encounter (Signed)
Pt states she was seen on 05/26/2021.  Pt states was given a new prescription for the following medication  that will now cost $146.00 in which she can not afford.   The patient would like a call back about what else she can take.  Empagliflozin-metFORMIN HCl (SYNJARDY) 04-999 MG TABS    [facility] (Ph: [phone])  Order Details

## 2021-05-31 ENCOUNTER — Ambulatory Visit: Payer: Medicare Other | Admitting: Podiatry

## 2021-06-01 NOTE — Progress Notes (Signed)
Submitted application for TRULICITY 0.75MG /0.5ML to [employer] for patient assistance.   Phone: [phone]

## 2021-06-02 ENCOUNTER — Ambulatory Visit (INDEPENDENT_AMBULATORY_CARE_PROVIDER_SITE_OTHER): Payer: Medicare Other | Admitting: Dietician

## 2021-06-02 ENCOUNTER — Ambulatory Visit: Payer: Medicare Other | Admitting: Dietician

## 2021-06-02 ENCOUNTER — Encounter: Payer: Self-pay | Admitting: Dietician

## 2021-06-02 DIAGNOSIS — E118 Type 2 diabetes mellitus with unspecified complications: Secondary | ICD-10-CM

## 2021-06-02 DIAGNOSIS — Z794 Long term (current) use of insulin: Secondary | ICD-10-CM | POA: Diagnosis not present

## 2021-06-02 DIAGNOSIS — R197 Diarrhea, unspecified: Secondary | ICD-10-CM

## 2021-06-02 DIAGNOSIS — Z713 Dietary counseling and surveillance: Secondary | ICD-10-CM | POA: Diagnosis not present

## 2021-06-02 NOTE — Progress Notes (Signed)
Medical Nutrition Therapy:  Appt start time: 1:15 end time: 1:45 time: 30 minutes 10 minutes for CGM download Visit # 8  Assessment: Patient's primary concern today was her chronic diarrhea and professional Continuous glucose monitoring 14 day report and sensor was removed today.  She says she thinks the Lactaid tablets are helping, she had several formed stools this week. She took lantus a few times because she cannot get Trulicty until patient assistance is approved.  Her CGM report shows excellent blood sugar control with less but still some hypoglycemia. Her care was discussed with Dr. Wynetta Emery today.   ANTHROPOMETRICS: Estimated body mass index is 27.62 kg/m as calculated from the following:   Height as of 05/26/21: 5' 1.5" (1.562 m).   Weight as of this encounter: 148 lb 9.6 oz (67.4 kg). Wt Readings from Last 10 Encounters:  06/02/21 148 lb 9.6 oz (67.4 kg)  05/26/21 147 lb 11.2 oz (67 kg)  05/26/21 147 lb 11.2 oz (67 kg)  05/19/21 146 lb 11.2 oz (66.5 kg)  04/29/21 140 lb 3.2 oz (63.6 kg)  03/18/21 138 lb (62.6 kg)  01/27/21 140 lb 1.6 oz (63.5 kg)  01/20/21 140 lb 14.4 oz (63.9 kg)  01/20/21 140 lb 14.4 oz (63.9 kg)  01/12/21 140 lb 6.4 oz (63.7 kg)   MEDICATIONS: Lantus 4 units two times a few times during the past week because she couldn't;t get the other medicines that were prescribed, metformin 1000 mg two times a day BLOOD SUGAR: she forgot her meter, CGM Results from download:   Average glucose:    116 mg/dL for 14 days  Glucose management indicator:   6.1 %  Time in range (70-180 mg/dL):   69 %   (Goal >70%)  Time High (181-250 mg/dL):   13 %   (Goal < 25%)  Time Very High (>250 mg/dL):    1 %   (Goal < 5%)  Time Low (54-69 mg/dL):   12%   (Goal <4%)  Time Very Low (<54 mg/dL):   5 %   (Goal <1%)  Coefficient of variation:    42.6 %   (Goal <36%)    DIETARY INTAKE:24-hr recall:  Not done today  Usual physical activity: Not discussed today  Progress Towards Goal(s):   Some progress.   Nutritional Diagnosis:  NB-1.1 Food and nutrition-related knowledge deficit as related to continued intake of potential GI irritating foods without Lactaid enzymes improving as evidenced by her report of decreased intake of dairy and improved diarrhea and abdominal soreness    Intervention:  Nutrition education about Continuous glucose monitoring download report and diabetes medicines. Educated about how to take trulicity Action Goal: see patient instructions, try to omit lactose or take Lactaid pills when she eats it   Outcome goal: improved blood sugars, improved GI distress Coordination of care: Discussed low blood sugars and Continuous glucose monitoring with her doctor  Teaching Method Utilized: Visual, Auditory Handouts given during visit include: After visit summary  Barriers to learning/adherence to lifestyle change: competing values Demonstrated degree of understanding via:  Teach Back   Monitoring/Evaluation:  Dietary intake, GI symptoms, meter, and body weight in 3 month(s). [name], RD 06/02/2021 6:06 PM.

## 2021-06-02 NOTE — Patient Instructions (Addendum)
As per Dr. Wynetta Emery:   Stop Lantus insulin.   Only take metformin until you get the Trulicity.   Then begin taking Trulicity 0.75mg  once a week on the same day.   Please make a follow up in late August early September with your doctor  Call in the meantime with questions or concerns.   When you are taking Trulicity, you only need to check your blood sugar a few times a week.    Butch Penny [phone]

## 2021-06-03 ENCOUNTER — Other Ambulatory Visit (HOSPITAL_COMMUNITY): Payer: Self-pay

## 2021-06-03 NOTE — Telephone Encounter (Signed)
-----   Message from Resa Miner, RD sent at 06/02/2021  6:11 PM EDT ----- Regarding: Foot doctor is having a difficutl time getting in touch with [place] per Ms. Moxey Hi All,   I wanted to relay this message before I forgot about it. Does anyone know what this is about or how to resolve it?   Than you!

## 2021-06-03 NOTE — Telephone Encounter (Signed)
Spoke with the patient in reference to what was needed.  The patient is needing paperwork signed in order to her Diabetic Shoes.  Per the patient the [facility] has been faxing our office several times to complete this DME request.  Will forward this Information to the the DME team to research.

## 2021-06-07 ENCOUNTER — Other Ambulatory Visit: Payer: Self-pay

## 2021-06-07 ENCOUNTER — Encounter: Payer: Self-pay | Admitting: Gastroenterology

## 2021-06-07 ENCOUNTER — Ambulatory Visit (AMBULATORY_SURGERY_CENTER): Payer: Medicare Other | Admitting: Gastroenterology

## 2021-06-07 VITALS — BP 130/65 | HR 85 | Temp 96.2°F | Resp 11 | Ht 61.5 in | Wt 138.0 lb

## 2021-06-07 DIAGNOSIS — K2950 Unspecified chronic gastritis without bleeding: Secondary | ICD-10-CM

## 2021-06-07 DIAGNOSIS — B9681 Helicobacter pylori [H. pylori] as the cause of diseases classified elsewhere: Secondary | ICD-10-CM

## 2021-06-07 DIAGNOSIS — R1010 Upper abdominal pain, unspecified: Secondary | ICD-10-CM

## 2021-06-07 DIAGNOSIS — D649 Anemia, unspecified: Secondary | ICD-10-CM

## 2021-06-07 DIAGNOSIS — K2970 Gastritis, unspecified, without bleeding: Secondary | ICD-10-CM

## 2021-06-07 MED ORDER — SODIUM CHLORIDE 0.9 % IV SOLN: 500.0000 mL | Freq: Once | INTRAVENOUS | Status: DC

## 2021-06-07 NOTE — Progress Notes (Signed)
Called to room to assist during endoscopic procedure.  Patient ID and intended procedure confirmed with present staff. Received instructions for my participation in the procedure from the performing physician.  

## 2021-06-07 NOTE — Progress Notes (Signed)
VS by [place]. 

## 2021-06-07 NOTE — Progress Notes (Signed)
To PACU, VSS. Report to Rn.tb 

## 2021-06-07 NOTE — Patient Instructions (Signed)
Information on gastritis given to you today.  Await pathology results.  Resume previous diet and medications.  YOU HAD AN ENDOSCOPIC PROCEDURE TODAY AT THE [facility]:   Refer to the procedure report that was given to you for any specific questions about what was found during the examination.  If the procedure report does not answer your questions, please call your gastroenterologist to clarify.  If you requested that your care partner not be given the details of your procedure findings, then the procedure report has been included in a sealed envelope for you to review at your convenience later.  YOU SHOULD EXPECT: Some feelings of bloating in the abdomen. Passage of more gas than usual.  Walking can help get rid of the air that was put into your GI tract during the procedure and reduce the bloating. If you had a lower endoscopy (such as a colonoscopy or flexible sigmoidoscopy) you may notice spotting of blood in your stool or on the toilet paper. If you underwent a bowel prep for your procedure, you may not have a normal bowel movement for a few days.  Please Note:  You might notice some irritation and congestion in your nose or some drainage.  This is from the oxygen used during your procedure.  There is no need for concern and it should clear up in a day or so.  SYMPTOMS TO REPORT IMMEDIATELY:  Following upper endoscopy (EGD)  Vomiting of blood or coffee ground material  New chest pain or pain under the shoulder blades  Painful or persistently difficult swallowing  New shortness of breath  Fever of 100F or higher  Black, tarry-looking stools  For urgent or emergent issues, a gastroenterologist can be reached at any hour by calling [phone]. Do not use MyChart messaging for urgent concerns.    DIET:  We do recommend a small meal at first, but then you may proceed to your regular diet.  Drink plenty of fluids but you should avoid alcoholic beverages for 24  hours.  ACTIVITY:  You should plan to take it easy for the rest of today and you should NOT DRIVE or use heavy machinery until tomorrow (because of the sedation medicines used during the test).    FOLLOW UP: Our staff will call the number listed on your records 48-72 hours following your procedure to check on you and address any questions or concerns that you may have regarding the information given to you following your procedure. If we do not reach you, we will leave a message.  We will attempt to reach you two times.  During this call, we will ask if you have developed any symptoms of COVID 19. If you develop any symptoms (ie: fever, flu-like symptoms, shortness of breath, cough etc.) before then, please call [phone].  If you test positive for Covid 19 in the 2 weeks post procedure, please call and report this information to us.    If any biopsies were taken you will be contacted by phone or by letter within the next 1-3 weeks.  Please call us at [phone] if you have not heard about the biopsies in 3 weeks.    SIGNATURES/CONFIDENTIALITY: You and/or your care partner have signed paperwork which will be entered into your electronic medical record.  These signatures attest to the fact that that the information above on your After Visit Summary has been reviewed and is understood.  Full responsibility of the confidentiality of this discharge information lies with you   you and/or your care-partner.

## 2021-06-07 NOTE — Op Note (Signed)
[facility] Patient Name: Amanda [name] Procedure Date: 06/07/2021 10:04 AM MRN: [id number] Endoscopist: Milus Banister , MD Age: 70 Referring MD:  Date of Birth: Feb 20, 1951 Gender: Female Account #: [id number] Procedure:                Upper GI endoscopy Indications:              Diarrhea, elevated TTG level, poorly controlled DM Medicines:                Monitored Anesthesia Care Procedure:                Pre-Anesthesia Assessment:                           - Prior to the procedure, a History and Physical                            was performed, and patient medications and                            allergies were reviewed. The patient's tolerance of                            previous anesthesia was also reviewed. The risks                            and benefits of the procedure and the sedation                            options and risks were discussed with the patient.                            All questions were answered, and informed consent                            was obtained. Prior Anticoagulants: The patient has                            taken no previous anticoagulant or antiplatelet                            agents. ASA Grade Assessment: III - A patient with                            severe systemic disease. After reviewing the risks                            and benefits, the patient was deemed in                            satisfactory condition to undergo the procedure.                           After obtaining informed consent, the endoscope was  passed under direct vision. Throughout the                            procedure, the patient's blood pressure, pulse, and                            oxygen saturations were monitored continuously. The                            Endoscope was introduced through the mouth, and                            advanced to the second part of duodenum. The upper                             GI endoscopy was accomplished without difficulty.                            The patient tolerated the procedure well. Scope In: Scope Out: Findings:                 Small to medium amount of retained solid food in                            the stomach.                           Mild inflammation characterized by erythema,                            friability and granularity was found in the gastric                            antrum. Biopsies were taken with a cold forceps for                            histology.                           The exam was otherwise without abnormality.                           Normal duodenum mucosa was biopsied. Complications:            No immediate complications. Estimated blood loss:                            None. Estimated Blood Loss:     Estimated blood loss: none. Impression:               - Small to medium amount of retained solid food in                            the stomach. Likely due to poorly controlled DM.                           -  Mild gastritis, biopsied to check for H. pylori.                           - Duodenum was normal appearing, biopsied to check                            for Celiac Sprue.                           - The examination was otherwise normal. Recommendation:           - Patient has a contact number available for                            emergencies. The signs and symptoms of potential                            delayed complications were discussed with the                            patient. Return to normal activities tomorrow.                            Written discharge instructions were provided to the                            patient.                           - Resume previous diet.                           - Continue present medications.                           - Await pathology results. Milus Banister, MD 06/07/2021 10:15:28 AM This report has been signed electronically.

## 2021-06-09 ENCOUNTER — Telehealth: Payer: Self-pay

## 2021-06-09 ENCOUNTER — Encounter: Payer: Medicare Other | Admitting: Pharmacist

## 2021-06-09 NOTE — Telephone Encounter (Signed)
LVM

## 2021-06-10 ENCOUNTER — Ambulatory Visit (INDEPENDENT_AMBULATORY_CARE_PROVIDER_SITE_OTHER): Payer: Medicare Other | Admitting: Pharmacist

## 2021-06-10 ENCOUNTER — Other Ambulatory Visit: Payer: Self-pay

## 2021-06-10 DIAGNOSIS — Z794 Long term (current) use of insulin: Secondary | ICD-10-CM | POA: Diagnosis not present

## 2021-06-10 DIAGNOSIS — E118 Type 2 diabetes mellitus with unspecified complications: Secondary | ICD-10-CM

## 2021-06-10 NOTE — Assessment & Plan Note (Signed)
T2DM is controlled but could improve slightly as patient now receiving Trulicity with prior trouble affording medication therapy. Medication adherence appears optimal. Patient successfully administered first dose of Trulicity in office. Patient educated on purpose, proper use and potential adverse effects of Trulicity. Will discuss potential for Synjardy in the future but for now will hold off on medication assistance. Following instruction patient verbalized understanding of treatment plan.    1. Started GLP-1 dulaglutide (Trulicity) 0.75mg  once weekly on Thursdays  2. Extensively discussed pathophysiology of diabetes, dietary effects on blood sugar control, and recommended lifestyle interventions. 3. Counseled on s/sx of and management of hypoglycemia 4. Next A1C anticipated August 2022.

## 2021-06-10 NOTE — Telephone Encounter (Signed)
Patient returned your follow-up call after her procedure. She stated to be feeling fine.

## 2021-06-10 NOTE — Progress Notes (Signed)
Subjective:    Patient ID: Amanda [name], female    DOB: 11-17-1951, 70 y.o.   MRN: [id number]  HPI Patient is a 70 y.o. female who presents for diabetes management. She is in good spirits and presents without assistance. Patient was referred and last seen by Primary Care Provider on 05/26/21.  Patient approved for PAP through [employer] for Trulicity but wanted help with her first injection. She presented with medication to administer in clinic.   Patient reports diabetes was diagnosed in late 1990's.   FH: lives at home with disabled husband  Human resources officer affordability: Medicare  Current diabetes medications include: metformin 1000mg  BID Current hypertension medications include: amlodipine 10mg , chlorthalidone 25, linsipirl 10mg  Current hyperlipidemia medications include: atorvastatin 40mg  Patient states that She is taking her diabetes medications as prescribed. Patient reports adherence with medications.   Does you feel that your medications are working for you?  yes  Have you been experiencing any side effects to the medications prescribed? no  Do you have any problems obtaining medications due to transportation or finances?  Yes; cost synjardy    Patient-reported exercise habits: walking but not often   Patient denies hypoglycemic events. Patient denies polyuria (increased urination).  Patient denies polyphagia (increased appetite).  Patient denies polydipsia (increased thirst).  Patient reports neuropathy (nerve pain). Patient denies visual changes. Patient reports self foot exams.   Home fasting blood sugars: 130, 152  2 hour post-meal/random blood sugars: mid to high 100's  Objective:   Labs:   Physical Exam Neurological:     Mental Status: She is alert and oriented to person, place, and time.  Psychiatric:        Mood and Affect: Mood normal.    Lab Results  Component Value Date   HGBA1C 8.0 (H) 04/29/2021   HGBA1C 8.6 (A) 12/30/2020    HGBA1C 10.5 (A) 10/12/2020    Lab Results  Component Value Date   MICRALBCREAT 1,861 (H) 10/12/2020    Lipid Panel     Component Value Date/Time   CHOL 144 10/12/2020 1213   TRIG 56 10/12/2020 1213   HDL 73 10/12/2020 1213   CHOLHDL 2.0 10/12/2020 1213   CHOLHDL 2.4 09/10/2019 0404   VLDL 19 09/10/2019 0404   LDLCALC 59 10/12/2020 1213    Clinical Atherosclerotic Cardiovascular Disease (ASCVD): No  The 10-year ASCVD risk score Mikey Bussing DC Jr., et al., 2013) is: 18.6%   Values used to calculate the score:     Age: 10 years     Sex: Female     Is Non-Hispanic African American: Yes     Diabetic: Yes     Tobacco smoker: No     Systolic Blood Pressure: 782 mmHg     Is BP treated: Yes     HDL Cholesterol: 73 mg/dL     Total Cholesterol: 144 mg/dL    Assessment/Plan:   T2DM is controlled but could improve slightly as patient now receiving Trulicity with prior trouble affording medication therapy. Medication adherence appears optimal. Patient successfully administered first dose of Trulicity in office. Patient educated on purpose, proper use and potential adverse effects of Trulicity. Will discuss potential for Synjardy in the future but for now will hold off on medication assistance. Following instruction patient verbalized understanding of treatment plan.    Started GLP-1 dulaglutide (Trulicity) 0.75mg  once weekly on Thursdays  Extensively discussed pathophysiology of diabetes, dietary effects on blood sugar control, and recommended lifestyle interventions. Counseled on s/sx of and management of hypoglycemia  Next A1C anticipated August 2022.   ASCVD risk - primary prevention in patient with diabetes. Last LDL is controlled. ASCVD risk score is not >20%  -   Continued atorvastatin 40 mg.   Follow-up appointment one month to review sugar readings. Written patient instructions provided.  This appointment required 30 minutes of patient care (this includes precharting, chart  review, review of results, and face-to-face care).  Thank you for involving pharmacy to assist in providing this patient's care.

## 2021-06-10 NOTE — Patient Instructions (Signed)
Ms. Dondero it was a pleasure seeing you today.   Please do the following:  Continue Trulicity as directed today during your appointment. If you have any questions or if you believe something has occurred because of this change, call me or your doctor to let one of [place] know.  Continue checking blood sugars at home. It's really important that you record these and bring these in to your next doctor's appointment.  Continue making the lifestyle changes we've discussed together during our visit. Diet and exercise play a significant role in improving your blood sugars.  Follow-up with me in one month.    Hypoglycemia or low blood sugar:   Low blood sugar can happen quickly and may become an emergency if not treated right away.   While this shouldn't happen often, it can be brought upon if you skip a meal or do not eat enough. Also, if your insulin or other diabetes medications are dosed too high, this can cause your blood sugar to go to low.   Warning signs of low blood sugar include: Feeling shaky or dizzy Feeling weak or tired  Excessive hunger Feeling anxious or upset  Sweating even when you aren't exercising  What to do if I experience low blood sugar? Follow the Rule of 15 Check your blood sugar with your meter. If lower than 70, proceed to step 2.  Treat with 15 grams of fast acting carbs which is found in 3-4 glucose tablets. If none are available you can try hard candy, 1 tablespoon of sugar or honey,4 ounces of fruit juice, or 6 ounces of REGULAR soda.  Re-check your sugar in 15 minutes. If it is still below 70, do what you did in step 2 again. If your blood sugar has come back up, go ahead and eat a snack or small meal made up of complex carbs (ex. Whole grains) and protein at this time to avoid recurrence of low blood sugar.

## 2021-06-11 NOTE — Progress Notes (Signed)
Received notification from [facility] regarding approval for TRULICITY 0.75MG /0.5ML. Patient assistance approved from 06/02/21 to 12/18/21.  PT REC'D A 4 MONTH SUPPLY TO HER HOME 06/07/21 & WILL RECEIVE AUTO REFILLS UNTIL THE END OF THE CALENDAR YEAR.   Phone: [phone]

## 2021-06-14 NOTE — Progress Notes (Signed)
Great news- thank you!

## 2021-06-15 ENCOUNTER — Other Ambulatory Visit: Payer: Self-pay

## 2021-06-15 ENCOUNTER — Other Ambulatory Visit (HOSPITAL_COMMUNITY): Payer: Self-pay

## 2021-06-15 MED ORDER — AMOXICILLIN 500 MG PO CAPS
1000.0000 mg | ORAL_CAPSULE | Freq: Two times a day (BID) | ORAL | 0 refills | Status: AC
  Filled 2021-06-15: qty 40, 10d supply, fill #0

## 2021-06-15 MED ORDER — OMEPRAZOLE 20 MG PO CPDR
20.0000 mg | DELAYED_RELEASE_CAPSULE | Freq: Two times a day (BID) | ORAL | 0 refills | Status: DC
  Filled 2021-06-15: qty 20, 10d supply, fill #0

## 2021-06-15 MED ORDER — CLARITHROMYCIN 500 MG PO TABS
500.0000 mg | ORAL_TABLET | Freq: Two times a day (BID) | ORAL | 0 refills | Status: AC
  Filled 2021-06-15: qty 20, 10d supply, fill #0

## 2021-06-15 MED ORDER — METRONIDAZOLE 500 MG PO TABS
500.0000 mg | ORAL_TABLET | Freq: Three times a day (TID) | ORAL | 0 refills | Status: AC
  Filled 2021-06-15: qty 30, 10d supply, fill #0

## 2021-06-15 NOTE — Progress Notes (Signed)
The patient has been notified of this information and all questions answered.

## 2021-06-17 ENCOUNTER — Other Ambulatory Visit (HOSPITAL_COMMUNITY): Payer: Self-pay

## 2021-06-23 ENCOUNTER — Telehealth: Payer: Self-pay | Admitting: Gastroenterology

## 2021-06-23 DIAGNOSIS — R531 Weakness: Secondary | ICD-10-CM

## 2021-06-23 NOTE — Telephone Encounter (Signed)
The pt has been advised to continue abx and come in for labs.  Lab order entered.  The pt has been advised of the information and verbalized understanding.

## 2021-06-23 NOTE — Telephone Encounter (Signed)
Encourage her to complete the abx.  Lets also get a cbc given her weakness.  Thanks

## 2021-06-23 NOTE — Telephone Encounter (Signed)
Patient called states she has been feeling weak since her EGD procedure on 06/07/21. Also said she was given medications and is not sure if its coming from that.

## 2021-06-24 ENCOUNTER — Other Ambulatory Visit (INDEPENDENT_AMBULATORY_CARE_PROVIDER_SITE_OTHER): Payer: Medicare Other

## 2021-06-24 DIAGNOSIS — R531 Weakness: Secondary | ICD-10-CM

## 2021-06-24 LAB — CBC WITH DIFFERENTIAL/PLATELET
Basophils Absolute: 0 10*3/uL (ref 0.0–0.1)
Basophils Relative: 0.6 % (ref 0.0–3.0)
Eosinophils Absolute: 0.1 10*3/uL (ref 0.0–0.7)
Eosinophils Relative: 1 % (ref 0.0–5.0)
HCT: 31.6 % — ABNORMAL LOW (ref 36.0–46.0)
Hemoglobin: 10.7 g/dL — ABNORMAL LOW (ref 12.0–15.0)
Lymphocytes Relative: 35 % (ref 12.0–46.0)
Lymphs Abs: 2.1 10*3/uL (ref 0.7–4.0)
MCHC: 33.9 g/dL (ref 30.0–36.0)
MCV: 91.4 fl (ref 78.0–100.0)
Monocytes Absolute: 0.4 10*3/uL (ref 0.1–1.0)
Monocytes Relative: 6.1 % (ref 3.0–12.0)
Neutro Abs: 3.5 10*3/uL (ref 1.4–7.7)
Neutrophils Relative %: 57.3 % (ref 43.0–77.0)
Platelets: 306 10*3/uL (ref 150.0–400.0)
RBC: 3.46 Mil/uL — ABNORMAL LOW (ref 3.87–5.11)
RDW: 13.3 % (ref 11.5–15.5)
WBC: 6 10*3/uL (ref 4.0–10.5)

## 2021-06-25 ENCOUNTER — Other Ambulatory Visit (HOSPITAL_COMMUNITY): Payer: Self-pay

## 2021-06-25 MED FILL — Lisinopril Tab 10 MG: ORAL | 90 days supply | Qty: 90 | Fill #0 | Status: CN

## 2021-06-28 ENCOUNTER — Telehealth: Payer: Self-pay | Admitting: Podiatry

## 2021-06-28 ENCOUNTER — Other Ambulatory Visit (HOSPITAL_COMMUNITY): Payer: Self-pay

## 2021-06-28 ENCOUNTER — Ambulatory Visit (INDEPENDENT_AMBULATORY_CARE_PROVIDER_SITE_OTHER): Payer: Medicare Other | Admitting: Internal Medicine

## 2021-06-28 ENCOUNTER — Encounter: Payer: Self-pay | Admitting: Internal Medicine

## 2021-06-28 ENCOUNTER — Other Ambulatory Visit: Payer: Self-pay

## 2021-06-28 VITALS — BP 129/72 | HR 87 | Temp 97.8°F | Ht 61.0 in | Wt 142.8 lb

## 2021-06-28 DIAGNOSIS — J0100 Acute maxillary sinusitis, unspecified: Secondary | ICD-10-CM | POA: Diagnosis not present

## 2021-06-28 DIAGNOSIS — I10 Essential (primary) hypertension: Secondary | ICD-10-CM | POA: Diagnosis not present

## 2021-06-28 DIAGNOSIS — R053 Chronic cough: Secondary | ICD-10-CM

## 2021-06-28 DIAGNOSIS — E118 Type 2 diabetes mellitus with unspecified complications: Secondary | ICD-10-CM

## 2021-06-28 DIAGNOSIS — Z794 Long term (current) use of insulin: Secondary | ICD-10-CM

## 2021-06-28 DIAGNOSIS — K529 Noninfective gastroenteritis and colitis, unspecified: Secondary | ICD-10-CM

## 2021-06-28 NOTE — Telephone Encounter (Signed)
Pt left message on 7.1 while I was out of the office checking on diabetic shoe status. She stated the pcp told her they had contacted [place].  Upon checking we have the documents and I called pt and apologized but the manufacturer is delayed and I was told my products should be shipping within the next 2 wks and I would call when they come in.

## 2021-06-28 NOTE — Patient Instructions (Addendum)
Amanda [name], it was a pleasure seeing you today!  Today we discussed: High Blood Pressure: Your blood pressure is stable and not elevated. You do however have a complaint of dizziness which is positional suggesting decreased volume. Since you have diarrhea, we will stop your Chlorthalidone so you are not peeing as frequently. We will also stop your Lisinopril as you have developed a cough and kidney injury is more likely if you become dehydrated with this medication onboard. Continue taking Norvasc 10 mg. DMII-Your blood sugars seem within range. The A1c is decreasing so continue taking your Metformin and Trulicity. Anemia/Fatigue: You complained of fatigue and you have anemia. Your anemia is stable and it appears your fatigue is due to your dehydration. Continue to hydrate well. Dizziness: Your main concern today was dizziness. It appears the dizziness is secondary to dehydration which is due to diarrhea and the medication Chlorthalidone. Drink 3 L of water daily.  Headache/Sinusitis: You also complained of mild headache. Your forehead was tender to palpation suggesting it is due to congestion of your sinuses. You stated Flonase helps but you use it sporadically. Continue using it daily.  Diarrhea: Diarrhea is a chronic issue for you. I believe this is the cause for your dehydration and not the new medications. Start taking FiberCon or Fiber tablets from [employer] as stated on the package. Continue your antibiotics for the stomach infection. Follow up with GI as instructed. Drink plenty of water to replenish your loss from the diarrhea.   I have ordered the following labs today:  Lab Orders  BMP8+Anion Gap      Referrals ordered today:   Referral Orders  No referral(s) requested today     I have ordered the following medication/changed the following medications:   Stop the following medications: Medications Discontinued During This Encounter  Medication Reason   0.9 %  sodium  chloride infusion    Empagliflozin-metFORMIN HCl (SYNJARDY) 04-999 MG TABS Cost of medication   chlorthalidone (HYGROTON) 25 MG tablet Change in therapy   lisinopril (ZESTRIL) 10 MG tablet Change in therapy     Start the following medications: No orders of the defined types were placed in this encounter.    Follow-up: 3-4 days  Please make sure to arrive 15 minutes prior to your next appointment. If you arrive late, you may be asked to reschedule.   We look forward to seeing you next time. Please call our clinic at [phone] if you have any questions or concerns. The best time to call is Monday-Friday from 9am-4pm, but there is someone available 24/7. If after hours or the weekend, call the main hospital number and ask for the Internal Medicine Resident On-Call. If you need medication refills, please notify your pharmacy one week in advance and they will send [place] a request.  Thank you for letting us take part in your care. Wishing you the best!  Thank you, Idamae Schuller, MD

## 2021-06-29 ENCOUNTER — Other Ambulatory Visit: Payer: Self-pay

## 2021-06-29 ENCOUNTER — Telehealth: Payer: Self-pay

## 2021-06-29 ENCOUNTER — Other Ambulatory Visit (HOSPITAL_COMMUNITY): Payer: Self-pay

## 2021-06-29 LAB — BMP8+ANION GAP
Anion Gap: 14 mmol/L (ref 10.0–18.0)
BUN/Creatinine Ratio: 23 (ref 12–28)
BUN: 37 mg/dL — ABNORMAL HIGH (ref 8–27)
CO2: 17 mmol/L — ABNORMAL LOW (ref 20–29)
Calcium: 9.3 mg/dL (ref 8.7–10.3)
Chloride: 108 mmol/L — ABNORMAL HIGH (ref 96–106)
Creatinine, Ser: 1.61 mg/dL — ABNORMAL HIGH (ref 0.57–1.00)
Glucose: 81 mg/dL (ref 65–99)
Potassium: 5.5 mmol/L — ABNORMAL HIGH (ref 3.5–5.2)
Sodium: 139 mmol/L (ref 134–144)
eGFR: 35 mL/min/{1.73_m2} — ABNORMAL LOW (ref >59)

## 2021-06-29 MED ORDER — LANSOPRAZOLE 30 MG PO CPDR
30.0000 mg | DELAYED_RELEASE_CAPSULE | Freq: Two times a day (BID) | ORAL | 0 refills | Status: DC
  Filled 2021-06-29: qty 20, 10d supply, fill #0

## 2021-06-29 MED ORDER — CLARITHROMYCIN 500 MG PO TABS
500.0000 mg | ORAL_TABLET | Freq: Two times a day (BID) | ORAL | 0 refills | Status: AC
  Filled 2021-06-29: qty 20, 10d supply, fill #0

## 2021-06-29 MED ORDER — AMOXICILL-CLARITHRO-LANSOPRAZ PO MISC
Freq: Two times a day (BID) | ORAL | 0 refills | Status: DC
  Filled 2021-06-29: qty 112, 14d supply, fill #0

## 2021-06-29 MED ORDER — AMOXICILLIN 500 MG PO CAPS
1000.0000 mg | ORAL_CAPSULE | Freq: Two times a day (BID) | ORAL | 0 refills | Status: AC
  Filled 2021-06-29: qty 40, 10d supply, fill #0

## 2021-06-29 NOTE — Telephone Encounter (Signed)
Requesting lab results, please call pt back.  

## 2021-06-30 ENCOUNTER — Encounter: Payer: Self-pay | Admitting: Internal Medicine

## 2021-06-30 NOTE — Assessment & Plan Note (Signed)
Assessment Patient has a history of dry cough. We will eliminate lisinopril from patient's regimen to see if that eliminates the problem. If not, we will workup further.   Plan -Stop Lisinopril

## 2021-06-30 NOTE — Assessment & Plan Note (Signed)
Assessment: Patient's has hypertension that is controlled. Today's blood pressure was 129/72. Current medications include Norvac, Lisinopril, and Chlorthalidone. Patient endorsed adherence. Patient complained of dizziness. This appears secondary to volume loss as patient has chronic diarrhea. Orthostatics were positives. Patient endorsed dizziness and cough as associated symptoms. We can stop both lisinopril and chlorthalidone due to cough and diuretic respectively. Will adjust medications as needed for better bp control.   Plan: -Continue Norvasc 10 mg daily -Stop Chlorthalidone and Lisinopril -Repeat BMP

## 2021-06-30 NOTE — Assessment & Plan Note (Signed)
Assessment: Patient's has diabetes that is controlled. Last A1c was 8.0. Current medications include Trulicity and Metfromin. Patient endorsed adherence. Patient denied any associated symptoms.  Plan: -Continue current medications Trulicity and Metformin  -Repeat A1c during next visit

## 2021-06-30 NOTE — Assessment & Plan Note (Signed)
Assessment Patient has a history of chronic diarrhea that is being evaluated by GI. She currently has a H. Pylori infection being treated by GI. This seem to be driving the dizziness. I recommended Align and Fiber tablets.   Plan -Start Align -Start Fiber tablets

## 2021-06-30 NOTE — Progress Notes (Signed)
CC: "I feel dizzy"  HPI:  Amanda [name] is a 70 y.o. with medical history as below presenting to [facility] for dizziness.  Please see problem-based list for further details, assessments, and plans.  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  HENT:  Positive for sinus pain. Negative for ear pain, hearing loss and tinnitus.   Eyes:  Negative for blurred vision, double vision and pain.  Respiratory:  Negative for cough, hemoptysis, sputum production and shortness of breath.   Cardiovascular:  Negative for chest pain, palpitations and orthopnea.  Gastrointestinal:  Positive for diarrhea. Negative for abdominal pain, blood in stool, heartburn, melena, nausea and vomiting.  Genitourinary:  Negative for dysuria, frequency and urgency.  Musculoskeletal:  Positive for joint pain. Negative for back pain, myalgias and neck pain.  Skin:  Negative for itching and rash.  Neurological:  Positive for dizziness and headaches. Negative for tingling, loss of consciousness and weakness.  Endo/Heme/Allergies:  Negative for polydipsia. Does not bruise/bleed easily.  Psychiatric/Behavioral:  Negative for depression, substance abuse and suicidal ideas.    Physical Exam:  Vitals:   06/28/21 1318  BP: 129/72  Pulse: 87  Temp: 97.8 F (36.6  C)  TempSrc: Oral  SpO2: 100%  Weight: 142 lb 12.8 oz (64.8 kg)  Height: 5\' 1"  (1.549 m)    Physical Exam Constitutional:      General: She is not in acute distress.    Appearance: Normal appearance. She is not ill-appearing.  HENT:     Head: Normocephalic and atraumatic.     Right Ear: External ear normal.     Left Ear: External ear normal.     Nose: Nose normal.     Mouth/Throat:     Mouth: Mucous membranes are moist.     Pharynx: Oropharynx is clear.  Eyes:     Extraocular Movements: Extraocular movements intact.     Conjunctiva/sclera: Conjunctivae normal.     Pupils: Pupils are equal, round, and reactive to light.  Cardiovascular:     Rate and Rhythm: Normal rate and regular rhythm.     Pulses: Normal pulses.     Heart sounds: Normal heart sounds. No murmur heard.   No friction rub. No gallop.  Pulmonary:     Effort: Pulmonary effort is normal. No respiratory distress.     Breath sounds: Normal breath sounds. No stridor. No wheezing or rhonchi.  Abdominal:     General: Bowel sounds are normal. There is no distension.     Palpations: Abdomen is soft. There is no mass.  Musculoskeletal:        General: No swelling or tenderness. Normal range of motion.     Cervical back: Normal range of motion  and neck supple.  Skin:    Capillary Refill: Capillary refill takes less than 2 seconds.     Coloration: Skin is not jaundiced.     Findings: No erythema, lesion or rash.  Neurological:     General: No focal deficit present.     Mental Status: She is alert and oriented to person, place, and time. Mental status is at baseline.  Psychiatric:        Mood and Affect: Mood normal.        Behavior: Behavior normal.    Assessment & Plan:   See Encounters Tab for problem based charting.  Patient seen with Dr. Letha Cape, MD

## 2021-06-30 NOTE — Assessment & Plan Note (Signed)
Assessment Patient has symptoms of sinusitis which include sinus pain and nasal congestion. Her headaches are due to sinusitis as well as they are frontal and elicited on palpation. Patient states she uses flonase sporadically but it does provide relief.   Plan -Use Flonase daily -Continue Zyrtec -Continue neti pot rinses

## 2021-07-01 ENCOUNTER — Encounter: Payer: Self-pay | Admitting: Internal Medicine

## 2021-07-05 NOTE — Assessment & Plan Note (Addendum)
Patient completing 10 day course of Pylera (follows w GI Dr. Ardis Hughs) for H. Pylori treatment.  She states that her diarrhea has improved significantly with the treatment and the fiber supplements.

## 2021-07-05 NOTE — Assessment & Plan Note (Addendum)
Current medications: Amlodipine 10 mg Adverse effects- recent episodes of dizziness with standing and a cough, Chlorthiadone and Lisinopril discontinued for this reason Blood pressure is 145/67--> 123/70 on repeat in clinic Her blood pressure log from home reflect blood pressure of 110-120/ 70-80 at home since discontinuing meds 06/28/21 Bmp completed 7/13 Plan -Will repeat BMP at next visit in August -Continue on Norvasc at this time

## 2021-07-06 ENCOUNTER — Other Ambulatory Visit (HOSPITAL_COMMUNITY): Payer: Self-pay

## 2021-07-06 ENCOUNTER — Ambulatory Visit (INDEPENDENT_AMBULATORY_CARE_PROVIDER_SITE_OTHER): Payer: Medicare Other | Admitting: Internal Medicine

## 2021-07-06 DIAGNOSIS — J0100 Acute maxillary sinusitis, unspecified: Secondary | ICD-10-CM

## 2021-07-06 DIAGNOSIS — E785 Hyperlipidemia, unspecified: Secondary | ICD-10-CM

## 2021-07-06 DIAGNOSIS — J302 Other seasonal allergic rhinitis: Secondary | ICD-10-CM

## 2021-07-06 DIAGNOSIS — I10 Essential (primary) hypertension: Secondary | ICD-10-CM

## 2021-07-06 DIAGNOSIS — K529 Noninfective gastroenteritis and colitis, unspecified: Secondary | ICD-10-CM

## 2021-07-06 DIAGNOSIS — E1142 Type 2 diabetes mellitus with diabetic polyneuropathy: Secondary | ICD-10-CM

## 2021-07-06 DIAGNOSIS — Z794 Long term (current) use of insulin: Secondary | ICD-10-CM

## 2021-07-06 MED ORDER — DICYCLOMINE HCL 10 MG PO CAPS
10.0000 mg | ORAL_CAPSULE | Freq: Two times a day (BID) | ORAL | 0 refills | Status: DC | PRN
  Filled 2021-07-06: qty 90, 45d supply, fill #0

## 2021-07-06 NOTE — Progress Notes (Signed)
   CC: follow up after medication change due to dizziness  HPI:  [name] is a 70 y.o. with medical history as below presenting to [facility] for follow-up after episodes of dizziness.  She was seen in [facility] for this problem and taken off Chlorthiadone and Lisinopril at that time.  Today she states that her dizziness has improved and her blood pressure at home has been within normal limits.  Please see problem-based list for further details, assessments, and plans.  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   Cataract    PRESENT IN LEFT/ CATARACTS REMOVED FROM RIGHT   Cervical radiculopathy due to degenerative joint disease of spine 05/03/2017   CKD (chronic kidney disease) stage 3, GFR 30-59 ml/min (HCC) 01/01/2021   Diabetes mellitus 1996   DKA (diabetic ketoacidoses) 01/02/2013   Gait abnormality 01/10/2019   Glaucoma    Hyperlipidemia    Hypertension    Hypotension    Influenza A (H1N1) 01/03/2013   Nail fungus 10/23/2017   Neuromuscular disorder ([place])    NEUROPATHY   Normocytic anemia 02/14/2013   Osteopenia 05/04/2016   SYNCOPE 05/10/2010   Qualifier: Diagnosis of  By: Marilynne Halsted RN, BSN, Jacquelyn     Review of Systems:  As per HPI  Physical Exam:  There were no vitals filed for this visit.  General: well-developed, well-nourished HENT: NCAT, Tenderness over frontal sinus Eyes: no scleral icterus, conjunctiva clear CV: Regular rate, no murmurs Pulm: CTAB, normal pulmonary effort GI: No tenderness, bowel sounds normal MSK: trace edema in lower extremities bilaterally, muscle strength 5/5 bilaterally Skin: No rashes or scars Neuro: decreased sensation in feet bilaterally Psych: Normal mood and affect  Assessment & Plan:   See Encounters Tab for problem based charting.  Patient seen with Dr.  Jimmye Norman

## 2021-07-06 NOTE — Telephone Encounter (Signed)
Pt had an appt today with Dr Howie Ill

## 2021-07-06 NOTE — Assessment & Plan Note (Signed)
Patient has congestion and feeling of fullness in her sinuses.  She does not use Zyrtec and uses Flonase occasionally.  Her main form of treatment is with frequent neti pot rinses. Plan: -Use flonase daily -Add Zyrtec -Continue neti pot rinses

## 2021-07-06 NOTE — Assessment & Plan Note (Signed)
Medications: Trulicity 8.24 once weekly, Metformin 1000mg  BID Last A1C: 8.0 05/22  A/P: Will recheck A1c at next visit in August. Continue on Metformin and Trulicity

## 2021-07-06 NOTE — Assessment & Plan Note (Signed)
Medications: Patient states that she is not currently taking Atorvastatin.  She did not discontinue due to side effects. Last Cholesterol: 144 09/2020 A/P: -Lipid panel at visit in 07/2021 -Patient advised to start taking Lipitor with increased risk for atherosclerosis in the setting of DM

## 2021-07-06 NOTE — Patient Instructions (Addendum)
Ms.Kyia R Owens Shark, it was a pleasure seeing you today!  Today we discussed: Improvement in dizziness after discontinuation of lisinopril and chlorthialidone.  Your blood pressure is well-controlled with Norvasc 10 mg, we will continue to monitor your blood pressure.  We also talked about the addition of trulicity.  Sounds like you are tolerating that well and we will recheck an A1c at your visit in August.  I have ordered the following labs today:  Lab Orders  No laboratory test(s) ordered today     Tests ordered today:  None; we will get an A1c at 08/22 visit  Referrals ordered today:   Referral Orders  No referral(s) requested today     I have ordered the following medication/changed the following medications:   Stop the following medications: Medications Discontinued During This Encounter  Medication Reason   dicyclomine (BENTYL) 10 MG capsule Discontinued by provider     Start the following medications: No orders of the defined types were placed in this encounter.    Follow-up:  4 weeks    Please make sure to arrive 15 minutes prior to your next appointment. If you arrive late, you may be asked to reschedule.   We look forward to seeing you next time. Please call our clinic at [phone] if you have any questions or concerns. The best time to call is Monday-Friday from 9am-4pm, but there is someone available 24/7. If after hours or the weekend, call the main hospital number and ask for the Internal Medicine Resident On-Call. If you need medication refills, please notify your pharmacy one week in advance and they will send [place] a request.  Thank you for letting us take part in your care. Wishing you the best!  Thank you, Dr. Howie Ill

## 2021-07-06 NOTE — Progress Notes (Signed)
Internal Medicine Clinic Attending  I saw and evaluated the patient.  I personally confirmed the key portions of the history and exam documented by Dr.  Chancy Milroy  and I reviewed pertinent patient test results.  The assessment, diagnosis, and plan were formulated together and I agree with the documentation in the resident's note.

## 2021-07-07 ENCOUNTER — Other Ambulatory Visit (HOSPITAL_COMMUNITY): Payer: Self-pay

## 2021-07-07 ENCOUNTER — Encounter: Payer: Self-pay | Admitting: Internal Medicine

## 2021-07-07 ENCOUNTER — Other Ambulatory Visit: Payer: Self-pay | Admitting: Student

## 2021-07-07 DIAGNOSIS — E1142 Type 2 diabetes mellitus with diabetic polyneuropathy: Secondary | ICD-10-CM

## 2021-07-07 DIAGNOSIS — Z794 Long term (current) use of insulin: Secondary | ICD-10-CM

## 2021-07-07 MED ORDER — METFORMIN HCL 1000 MG PO TABS
1000.0000 mg | ORAL_TABLET | Freq: Two times a day (BID) | ORAL | 2 refills | Status: DC
  Filled 2021-07-07: qty 90, 45d supply, fill #0
  Filled 2021-09-07: qty 90, 45d supply, fill #1

## 2021-07-08 ENCOUNTER — Ambulatory Visit (INDEPENDENT_AMBULATORY_CARE_PROVIDER_SITE_OTHER): Payer: Medicare Other | Admitting: Pharmacist

## 2021-07-08 ENCOUNTER — Other Ambulatory Visit (HOSPITAL_COMMUNITY): Payer: Self-pay

## 2021-07-08 DIAGNOSIS — Z794 Long term (current) use of insulin: Secondary | ICD-10-CM | POA: Diagnosis not present

## 2021-07-08 DIAGNOSIS — E1142 Type 2 diabetes mellitus with diabetic polyneuropathy: Secondary | ICD-10-CM

## 2021-07-08 MED ORDER — TRULICITY 1.5 MG/0.5ML ~~LOC~~ SOAJ: 1.5000 mg | SUBCUTANEOUS | 0 refills | Status: DC

## 2021-07-08 NOTE — Patient Instructions (Signed)
Amanda [name] it was a pleasure seeing you today.   Please do the following:  Increase Trulicity to 1.5mg  once weekly as directed today during your appointment. If you have any questions or if you believe something has occurred because of this change, call me or your doctor to let one of [place] know.  Continue checking blood sugars at home. It's really important that you record these and bring these in to your next doctor's appointment.  Continue making the lifestyle changes we've discussed together during our visit. Diet and exercise play a significant role in improving your blood sugars.  Follow-up with me in one month  Hypoglycemia or low blood sugar:   Low blood sugar can happen quickly and may become an emergency if not treated right away.   While this shouldn't happen often, it can be brought upon if you skip a meal or do not eat enough. Also, if your insulin or other diabetes medications are dosed too high, this can cause your blood sugar to go to low.   Warning signs of low blood sugar include: Feeling shaky or dizzy Feeling weak or tired  Excessive hunger Feeling anxious or upset  Sweating even when you aren't exercising  What to do if I experience low blood sugar? Follow the Rule of 15 Check your blood sugar with your meter. If lower than 70, proceed to step 2.  Treat with 15 grams of fast acting carbs which is found in 3-4 glucose tablets. If none are available you can try hard candy, 1 tablespoon of sugar or honey,4 ounces of fruit juice, or 6 ounces of REGULAR soda.  Re-check your sugar in 15 minutes. If it is still below 70, do what you did in step 2 again. If your blood sugar has come back up, go ahead and eat a snack or small meal made up of complex carbs (ex. Whole grains) and protein at this time to avoid recurrence of low blood sugar.

## 2021-07-08 NOTE — Assessment & Plan Note (Signed)
T2DM is controlled per patient home blood glucose readings. Medication adherence appears optimal. Patient tolerating Trulicity well and will increase to obtain further weight loss potential. Patient educated on purpose, proper use and potential adverse effects of Trulicity.  Following instruction patient verbalized understanding of treatment plan.    1. Increased GLP-1 dulaglutide (Trulicity) to 1.5mg  once weekly on Thursdays. This is a patient assistance medication so patient aware it may take some time to obtain. 2. Extensively discussed pathophysiology of diabetes, dietary effects on blood sugar control, and recommended lifestyle interventions. 3. Counseled on s/sx of and management of hypoglycemia 4. Next A1C anticipated August 2022.

## 2021-07-08 NOTE — Progress Notes (Signed)
Subjective:    Patient ID: Amanda [name], female    DOB: 1951/01/13, 70 y.o.   MRN: [id number]  HPI Patient is a 70 y.o. female who presents for diabetes management. She is in good spirits and presents without assistance. Patient was referred and last seen by Primary Care Provider on 05/26/21.  Patient states she has been tolerating Trulicity well but has not lost as much weight as she had hoped. She reports feeling tired today because she was at ER all night for her husband and she has some lower leg edema and is wondering if this is due to sitting for hours.   Patient reports diabetes was diagnosed in late 1990's.   FH: lives at home with disabled husband  Human resources officer affordability: Medicare  Current diabetes medications include: metformin 1000mg  BID, Trulicity 0.75mg  Current hypertension medications include: amlodipine 10mg  Current hyperlipidemia medications include: atorvastatin 40mg  Patient states that She is taking her diabetes medications as prescribed. Patient reports adherence with medications.   Does you feel that your medications are working for you?  yes  Have you been experiencing any side effects to the medications prescribed? no  Do you have any problems obtaining medications due to transportation or finances?  no   Patient-reported exercise habits: walking but not often   Patient denies hypoglycemic events. Patient denies polyuria (increased urination).  Patient reports polyphagia (increased appetite).  Patient reports polydipsia (increased thirst).  Patient reports neuropathy (nerve pain). Patient denies visual changes. Patient reports self foot exams.   Home fasting blood sugars: 123, 119, 121,  2 hour post-meal/random blood sugars: 138, 127, 116  Home blood pressure readings: 107/70 156/72 120/76   Objective:   Labs:   Physical Exam Neurological:     Mental Status: She is alert and oriented to person, place, and time.   Psychiatric:        Mood and Affect: Mood normal.   Review of Systems  Cardiovascular:  Positive for leg swelling. Negative for chest pain and orthopnea.  Gastrointestinal:  Negative for abdominal pain, nausea and vomiting.  Neurological:  Negative for dizziness, tingling, weakness and headaches.    Lab Results  Component Value Date   HGBA1C 8.0 (H) 04/29/2021   HGBA1C 8.6 (A) 12/30/2020   HGBA1C 10.5 (A) 10/12/2020    Lab Results  Component Value Date   MICRALBCREAT 1,861 (H) 10/12/2020    Lipid Panel     Component Value Date/Time   CHOL 144 10/12/2020 1213   TRIG 56 10/12/2020 1213   HDL 73 10/12/2020 1213   CHOLHDL 2.0 10/12/2020 1213   CHOLHDL 2.4 09/10/2019 0404   VLDL 19 09/10/2019 0404   LDLCALC 59 10/12/2020 1213    Clinical Atherosclerotic Cardiovascular Disease (ASCVD): No  The 10-year ASCVD risk score Mikey Bussing DC Jr., et al., 2013) is: 16.7%   Values used to calculate the score:     Age: 57 years     Sex: Female     Is Non-Hispanic African American: Yes     Diabetic: Yes     Tobacco smoker: No     Systolic Blood Pressure: 485 mmHg     Is BP treated: Yes     HDL Cholesterol: 73 mg/dL     Total Cholesterol: 144 mg/dL    Assessment/Plan:   T2DM is controlled per patient home blood glucose readings. Medication adherence appears optimal. Patient tolerating Trulicity well and will increase to obtain further weight loss potential. Patient educated on purpose, proper use and  potential adverse effects of Trulicity.  Following instruction patient verbalized understanding of treatment plan.    Increased GLP-1 dulaglutide (Trulicity) to 1.5mg  once weekly on Thursdays. This is a patient assistance medication so patient aware it may take some time to obtain. Extensively discussed pathophysiology of diabetes, dietary effects on blood sugar control, and recommended lifestyle interventions. Counseled on s/sx of and management of hypoglycemia Next A1C anticipated August  2022.   ASCVD risk - primary prevention in patient with diabetes. Last LDL is controlled. ASCVD risk score is not >20%  -   Continued atorvastatin 40 mg.   Follow-up appointment one month to review sugar readings. Written patient instructions provided.  This appointment required 30 minutes of direct patient care.  Thank you for involving pharmacy to assist in providing this patient's care.

## 2021-07-09 ENCOUNTER — Other Ambulatory Visit (HOSPITAL_COMMUNITY): Payer: Self-pay

## 2021-07-09 ENCOUNTER — Telehealth: Payer: Self-pay | Admitting: Nurse Practitioner

## 2021-07-09 NOTE — Telephone Encounter (Signed)
Inbound call from patient requesting a call back to discuss what could help with her current constipation. Best contact number  [phone]

## 2021-07-12 NOTE — Telephone Encounter (Signed)
The pt states that she has always had diarrhea but recently she has become constipated.  She tells me that she is not taking imodium any longer and has not for several months.  She has been asked to begin miralax 2 capfuls daily and titrate as needed.  She has a follow up on 9/13.  She will keep that appt and will call if she has any further issues prior to the f/u. The pt has been advised of the information and verbalized understanding.

## 2021-07-14 ENCOUNTER — Telehealth: Payer: Self-pay | Admitting: Pharmacist

## 2021-07-15 ENCOUNTER — Other Ambulatory Visit (HOSPITAL_COMMUNITY): Payer: Self-pay

## 2021-07-19 NOTE — Telephone Encounter (Signed)
Patient called to let me know she received her Trulicity in the mail. She reports tolerating well and denies any nausea/vomiting.

## 2021-07-26 ENCOUNTER — Other Ambulatory Visit (HOSPITAL_COMMUNITY): Payer: Self-pay

## 2021-07-28 ENCOUNTER — Other Ambulatory Visit (HOSPITAL_COMMUNITY): Payer: Self-pay

## 2021-07-30 ENCOUNTER — Other Ambulatory Visit (HOSPITAL_COMMUNITY): Payer: Self-pay

## 2021-08-02 ENCOUNTER — Ambulatory Visit (INDEPENDENT_AMBULATORY_CARE_PROVIDER_SITE_OTHER): Payer: Medicare Other

## 2021-08-02 ENCOUNTER — Other Ambulatory Visit: Payer: Self-pay

## 2021-08-02 ENCOUNTER — Ambulatory Visit: Payer: Medicare Other | Admitting: Podiatry

## 2021-08-02 ENCOUNTER — Encounter: Payer: Self-pay | Admitting: Podiatry

## 2021-08-02 DIAGNOSIS — M2042 Other hammer toe(s) (acquired), left foot: Secondary | ICD-10-CM

## 2021-08-02 DIAGNOSIS — M2012 Hallux valgus (acquired), left foot: Secondary | ICD-10-CM

## 2021-08-02 DIAGNOSIS — E1142 Type 2 diabetes mellitus with diabetic polyneuropathy: Secondary | ICD-10-CM | POA: Diagnosis not present

## 2021-08-02 DIAGNOSIS — M79605 Pain in left leg: Secondary | ICD-10-CM

## 2021-08-02 DIAGNOSIS — L84 Corns and callosities: Secondary | ICD-10-CM

## 2021-08-02 DIAGNOSIS — M2011 Hallux valgus (acquired), right foot: Secondary | ICD-10-CM

## 2021-08-02 DIAGNOSIS — M2041 Other hammer toe(s) (acquired), right foot: Secondary | ICD-10-CM

## 2021-08-02 DIAGNOSIS — M79604 Pain in right leg: Secondary | ICD-10-CM | POA: Diagnosis not present

## 2021-08-02 NOTE — Progress Notes (Signed)
Patient in office today for diabetic shoe and custom insert pick-up. Patient was able to try on shoes with inserts and was satisfied with the fit and feel of the shoes and inserts. Patient was educated on the break in process and return policy at this time. Patient verbalized understanding. Patient advised to call the office with any questions, comments, or concerns.

## 2021-08-03 ENCOUNTER — Ambulatory Visit (INDEPENDENT_AMBULATORY_CARE_PROVIDER_SITE_OTHER): Payer: Medicare Other | Admitting: Internal Medicine

## 2021-08-03 ENCOUNTER — Other Ambulatory Visit (HOSPITAL_COMMUNITY): Payer: Self-pay

## 2021-08-03 DIAGNOSIS — E1122 Type 2 diabetes mellitus with diabetic chronic kidney disease: Secondary | ICD-10-CM | POA: Diagnosis not present

## 2021-08-03 DIAGNOSIS — R197 Diarrhea, unspecified: Secondary | ICD-10-CM | POA: Diagnosis not present

## 2021-08-03 DIAGNOSIS — Z7984 Long term (current) use of oral hypoglycemic drugs: Secondary | ICD-10-CM

## 2021-08-03 DIAGNOSIS — K529 Noninfective gastroenteritis and colitis, unspecified: Secondary | ICD-10-CM

## 2021-08-03 DIAGNOSIS — I129 Hypertensive chronic kidney disease with stage 1 through stage 4 chronic kidney disease, or unspecified chronic kidney disease: Secondary | ICD-10-CM

## 2021-08-03 DIAGNOSIS — I10 Essential (primary) hypertension: Secondary | ICD-10-CM

## 2021-08-03 DIAGNOSIS — A048 Other specified bacterial intestinal infections: Secondary | ICD-10-CM | POA: Diagnosis not present

## 2021-08-03 DIAGNOSIS — E1142 Type 2 diabetes mellitus with diabetic polyneuropathy: Secondary | ICD-10-CM

## 2021-08-03 DIAGNOSIS — N1831 Chronic kidney disease, stage 3a: Secondary | ICD-10-CM

## 2021-08-03 DIAGNOSIS — E785 Hyperlipidemia, unspecified: Secondary | ICD-10-CM

## 2021-08-03 DIAGNOSIS — Z794 Long term (current) use of insulin: Secondary | ICD-10-CM

## 2021-08-03 MED ORDER — ATORVASTATIN CALCIUM 40 MG PO TABS
40.0000 mg | ORAL_TABLET | Freq: Every day | ORAL | 1 refills | Status: DC
  Filled 2021-08-03: qty 90, 90d supply, fill #0
  Filled 2021-12-09: qty 90, 90d supply, fill #1

## 2021-08-03 MED ORDER — AMLODIPINE BESYLATE 5 MG PO TABS
5.0000 mg | ORAL_TABLET | Freq: Every day | ORAL | 11 refills | Status: DC
  Filled 2021-08-03: qty 30, 30d supply, fill #0
  Filled 2021-10-01: qty 30, 30d supply, fill #1

## 2021-08-03 MED ORDER — LOSARTAN POTASSIUM 25 MG PO TABS
25.0000 mg | ORAL_TABLET | Freq: Every day | ORAL | 11 refills | Status: DC
  Filled 2021-08-03: qty 30, 30d supply, fill #0
  Filled 2021-09-16: qty 30, 30d supply, fill #1
  Filled 2021-10-13: qty 30, 30d supply, fill #2

## 2021-08-03 NOTE — Progress Notes (Signed)
  [facility] Internal Medicine Residency Telephone Encounter Continuity Care Appointment  HPI:  This telephone encounter was created for [name] on 08/03/2021 for follow-up for diabetes, HTN, CKD stage III and HLD.   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   Cataract    PRESENT IN LEFT/ CATARACTS REMOVED FROM RIGHT   Cervical radiculopathy due to degenerative joint disease of spine 05/03/2017   CKD (chronic kidney disease) stage 3, GFR 30-59 ml/min (HCC) 01/01/2021   Diabetes mellitus 1996   DKA (diabetic ketoacidoses) 01/02/2013   Gait abnormality 01/10/2019   Glaucoma    Hyperlipidemia    Hypertension    Hypotension    Influenza A (H1N1) 01/03/2013   Nail fungus 10/23/2017   Neuromuscular disorder ([place])    NEUROPATHY   Normocytic anemia 02/14/2013   Osteopenia 05/04/2016   SYNCOPE 05/10/2010   Qualifier: Diagnosis of  By: Marilynne Halsted, RN, BSN, Jacquelyn       ROS:  Denies dizziness, nausea, or constipation.  She has had 1 episode of diarrhea in the past two weeks, which is an improvement since H.pylori therapy.   Assessment / Plan / Recommendations:  Please see A&P under problem oriented charting for assessment of the patient's acute and chronic medical conditions.  As always, pt is advised that if symptoms worsen or new symptoms arise, they should go to an urgent care facility or to to ER for further evaluation.   Consent and Medical Decision Making:  Patient seen with Dr.  Jimmye Norman This is a telephone encounter between Amanda [name] and Christiana Fuchs on 08/03/2021 for follow-up on diabetes management, HTN, and HLD. The visit was conducted with the patient located at home and Christiana Fuchs at [facility]. The patient's identity was confirmed using their DOB and current address. The patient has consented to being evaluated through a telephone encounter and understands  the associated risks (an examination cannot be done and the patient may need to come in for an appointment) / benefits (allows the patient to remain at home, decreasing exposure to coronavirus). I personally spent 15 minutes on medical discussion.

## 2021-08-03 NOTE — Assessment & Plan Note (Signed)
Medications: Patient called via telehealth today.  She states that she is still not taking Atorvastatin.  She does not recall having side effects with the medication.  On chart review, she does have a refill available.  Resent in Atorvastatin 40 mg to [facility]  Encouraged patient to take medication to help prevent potential for cardiovascular disease.  Her ASCVD is 17.8%.  This is for primary prevention. Last Cholesterol:   144 09/2020 A/P: -Patient advised to start taking Atorvastatin

## 2021-08-03 NOTE — Assessment & Plan Note (Signed)
Patient presents with type 2 DM.  Her last A1c was 8.0 in 05/22.  She has been checking her blood sugar every morning between 7:30-11am and it is between 80-110.  Patient started Trulicity through prescription assistance working with Dr. Georgina Peer in May. Was on 0.75 and dosage was increased at the end of July.  She states that she has been using 2 of the 0.75 syringes each week.  States she is tolerating increased dosage well.   Plan: -Continue Trulicity 1.5 mg once weekly -Continue Metformin 1000mg  BID -Repeat A1c in one month, Previous mention of adding Synjardy through prescription assistance program.  May consider this depending on A1c results in September.

## 2021-08-03 NOTE — Assessment & Plan Note (Signed)
Patient with history of CKD stage III, GFR=35 .  This is likely secondary to her ongoing hypertension and diabetes.  Last BMP was 07/22 PLAN: -adding losartan 25mg  to slow the rate of progression with CKD.  -follow-up in 1 month, repeat BMP at that time

## 2021-08-03 NOTE — Assessment & Plan Note (Addendum)
Assessment: Patient presents today for further evaluation and management of her HTN.   Her blood pressure is controlled on the following medications:  1. Amlodipine 10 mg  She admits to being adherent with her antihypertensive medications.   She has the following underlying comorbidities that could affect his HTN treatment: chronic kidney disease.diabetes mellitus  Checking BP at home: Yes , usual 110/80  Recent blood pressure are listed below:  Blood Pressure 07/06/2021 06/28/2021 06/07/2021 05/26/2021 05/19/2021  BP 123/70 129/72 130/65 105/62 148/60  Some recent data might be hidden    Current metabolic panels:  BMP Latest Ref Rng & Units 06/28/2021 05/19/2021 04/29/2021  Glucose 65 - 99 mg/dL 81 85 215(H)  BUN 8 - 27 mg/dL 37(H) 32(H) 43(H)  Creatinine 0.57 - 1.00 mg/dL 1.61(H) 1.24(H) 2.14(H)  BUN/Creat Ratio 12 - 28 23 26 20   Sodium 134 - 144 mmol/L 139 139 137  Potassium 3.5 - 5.2 mmol/L 5.5(H) 4.8 5.2  Chloride 96 - 106 mmol/L 108(H) 104 101  CO2 20 - 29 mmol/L 17(L) 22 18(L)  Calcium 8.7 - 10.3 mg/dL 9.3 9.2 9.2     Plan: 1. Patient is currently on Amlodipine 10 mg monotherapy for her HTN.  In the setting of her CKD and DM, I think it would be beneficial for this patient to be on an ACEI or ARB.  Recent history of dizziness and was taken off of Chlorthalidone and Lisinopril in July.  She did have a chronic cough that became better once she stopped taking Lisinopril.  Will decrease Amlodipine from 10 mg to 5 mg and start losartan 25 mg. 2. Goal BP: <130/80 3. Instructed to keep a BP log: daily  4. Follow-up in one month

## 2021-08-03 NOTE — Assessment & Plan Note (Signed)
>>  ASSESSMENT AND PLAN FOR CKD (CHRONIC KIDNEY DISEASE), STAGE IV (HCC) WRITTEN ON 08/03/2021  4:38 PM BY [name], KATIE, DO  Patient with history of CKD stage III, GFR=35 .  This is likely secondary to her ongoing hypertension and diabetes.  Last BMP was 07/22 PLAN: -adding losartan 25mg  to slow the rate of progression with CKD.  -follow-up in 1 month, repeat BMP at that time

## 2021-08-03 NOTE — Assessment & Plan Note (Signed)
Patient states that her diarrhea has decreased.  One episode noted in last week.  She was diagnosed with H. Pylori and was receiving therapy, but had trouble tolerating doses of antibiotics due to nausea and malaise.  Dose decreased and she still had difficulty tolerating medications.  Following with GI for therapy PLAN: follow-up on GI

## 2021-08-04 ENCOUNTER — Telehealth: Payer: Self-pay

## 2021-08-04 NOTE — Telephone Encounter (Signed)
Called pt - stated she had talked to the doctor yesterday; wants to clarify BP meds which she picked up yesterday. Informed pt according to Dr Howie Ill' office note  "Will decrease Amlodipine from 10 mg to 5 mg and start losartan 25 mg". Stated she's currently not at home but will check her meds when she gets home and give me a call back to be sure she's taking the correct med/dosage.

## 2021-08-04 NOTE — Telephone Encounter (Signed)
Pt is requesting a call back . She is having questions about her medication losartan (COZAAR) 25 MG tablet  she is  wanting to know if that is the new medication she is to be taking

## 2021-08-06 NOTE — Progress Notes (Signed)
Subjective: Amanda [name] is a pleasant 70 y.o. female patient seen for diabetic foot care with h/o diabetic neuropathy.  She is seen for painful corn right 5th digit and painful thick toenails that are difficult to trim. Pain interferes with ambulation. Aggravating factors include wearing enclosed shoe gear. Pain is relieved with periodic professional debridement.  She voices no new pedal problems on today's visit.   She did not check her bloo glucose today. Last A1c was 7.0%.   PCP is [name], Joellen Jersey, DO. Last visit was: 08/03/2021.  Allergies  Allergen Reactions   Gabapentin Other (See Comments)    Pt states medication made her feel "out of it".   Pt states medication made her feel "out of it".      Objective: Physical Exam  General: Amanda [name] is a pleasant 70 y.o. African American female, WD, WN in NAD. AAO x 3.   Vascular:  Capillary fill time to digits <3 seconds b/l lower extremities. Palpable DP pulse(s) b/l lower extremities Palpable PT pulse(s) b/l lower extremities Pedal hair present. Lower extremity skin temperature gradient within normal limits. No pain with calf compression b/l. No edema noted b/l lower extremities.  Dermatological:  Pedal skin with normal turgor, texture and tone b/l lower extremities. No open wounds b/l lower extremities. No interdigital macerations b/l lower extremities. Toenails 1-5 b/l elongated, discolored, dystrophic, thickened, crumbly with subungual debris and tenderness to dorsal palpation. Hyperkeratotic lesion(s) R 5th toe.  No erythema, no edema, no drainage, no fluctuance.  Musculoskeletal:  Normal muscle strength 5/5 to all lower extremity muscle groups bilaterally. Hallux valgus with bunion deformity noted b/l lower extremities. Hammertoe(s) noted to the R 5th toe.  Neurological:  Pt has subjective symptoms of neuropathy. Protective sensation intact 5/5 intact bilaterally with 10g monofilament b/l.  Assessment and Plan:  1. Pain  in both lower extremities   2. Corns   3. Diabetic peripheral neuropathy associated with type 2 diabetes mellitus ([place])      -Examined patient. -Continue diabetic foot care principles: inspect feet daily, monitor glucose as recommended by PCP and/or Endocrinologist, and follow prescribed diet per PCP, Endocrinologist and/or dietician. -Patient to continue soft, supportive shoe gear daily. -Toenails 1-5 b/l were debrided in length and girth with sterile nail nippers and dremel without iatrogenic bleeding.  -Corn(s) R 5th toe pared utilizing sterile scalpel blade without complication or incident. Total number debrided=1. -Patient to report any pedal injuries to medical professional immediately. -Patient/POA to call should there be question/concern in the interim.  Return in about 3 months (around 11/02/2021).  Marzetta Board, DPM

## 2021-08-09 ENCOUNTER — Other Ambulatory Visit (HOSPITAL_COMMUNITY): Payer: Self-pay

## 2021-08-17 ENCOUNTER — Ambulatory Visit (INDEPENDENT_AMBULATORY_CARE_PROVIDER_SITE_OTHER): Payer: Medicare Other | Admitting: Pharmacist

## 2021-08-17 DIAGNOSIS — E1142 Type 2 diabetes mellitus with diabetic polyneuropathy: Secondary | ICD-10-CM | POA: Diagnosis not present

## 2021-08-17 DIAGNOSIS — Z794 Long term (current) use of insulin: Secondary | ICD-10-CM | POA: Diagnosis not present

## 2021-08-17 NOTE — Progress Notes (Signed)
Internal Medicine Clinic Attending  Case discussed with Dr. [name]  At the time of the visit.  We reviewed the resident's history and exam and pertinent patient test results.  I agree with the assessment, diagnosis, and plan of care documented in the resident's note.  

## 2021-08-17 NOTE — Patient Instructions (Addendum)
Amanda [name] it was a pleasure seeing you today.   Please do the following:  Increase Trulicity to 3mg  once weekly on Thursdays as directed today during your appointment. If you have any questions or if you believe something has occurred because of this change, call me or your doctor to let one of [place] know.  Continue checking blood sugars at home. It's really important that you record these and bring these in to your next doctor's appointment.  Continue making the lifestyle changes we've discussed together during our visit. Diet and exercise play a significant role in improving your blood sugars.  Follow-up with Dr. Howie Ill in 2-3 weeks   Hypoglycemia or low blood sugar:   Low blood sugar can happen quickly and may become an emergency if not treated right away.   While this shouldn't happen often, it can be brought upon if you skip a meal or do not eat enough. Also, if your insulin or other diabetes medications are dosed too high, this can cause your blood sugar to go to low.   Warning signs of low blood sugar include: Feeling shaky or dizzy Feeling weak or tired  Excessive hunger Feeling anxious or upset  Sweating even when you aren't exercising  What to do if I experience low blood sugar? Follow the Rule of 15 Check your blood sugar with your meter. If lower than 70, proceed to step 2.  Treat with 15 grams of fast acting carbs which is found in 3-4 glucose tablets. If none are available you can try hard candy, 1 tablespoon of sugar or honey,4 ounces of fruit juice, or 6 ounces of REGULAR soda.  Re-check your sugar in 15 minutes. If it is still below 70, do what you did in step 2 again. If your blood sugar has come back up, go ahead and eat a snack or small meal made up of complex carbs (ex. Whole grains) and protein at this time to avoid recurrence of low blood sugar.

## 2021-08-17 NOTE — Progress Notes (Signed)
Subjective:    Patient ID: Amanda [name], female    DOB: 1951-03-22, 70 y.o.   MRN: [id number]  HPI Patient is a 70 y.o. female who presents for diabetes management. She is in good spirits and presents without assistance. Patient was referred on 05/26/21 and last seen by Primary Care Provider on 08/03/21. Last seen in pharmacy clinic on  Patient reports that she has been doing well and her blood glucose overall has looked good besides a reading this morning in the 200's due to stress. She reports tolerating the Trulicity 1.5mg  well with no nausea or vomiting.  Patient reports diabetes was diagnosed in late 1990's.   FH: lives at home with disabled husband  Human resources officer affordability: Medicare  Current diabetes medications include: metformin 1000mg  BID, Trulicity 1.5mg  Current hypertension medications include: amlodipine 10mg  Current hyperlipidemia medications include: atorvastatin 40mg  Patient states that She is taking her diabetes medications as prescribed. Patient reports adherence with medications.   Does you feel that your medications are working for you?  yes  Have you been experiencing any side effects to the medications prescribed? no  Do you have any problems obtaining medications due to transportation or finances?  no   Patient-reported exercise habits: walking but not often   Patient denies hypoglycemic events. Patient denies polyuria (increased urination).  Patient reports polyphagia (increased appetite).  Patient reports polydipsia (increased thirst).  Patient reports neuropathy (nerve pain). Patient denies visual changes. Patient reports self foot exams.   Home fasting blood sugars: 248, 108, 110, 122, 130, 123, 113, 103, 155  Home blood pressure readings: Low 100's/80-90's   Objective:   Labs:   Physical Exam Neurological:     Mental Status: She is alert and oriented to person, place, and time.  Psychiatric:        Mood and Affect: Mood  normal.   Review of Systems  Gastrointestinal:  Negative for abdominal pain, nausea and vomiting.  Neurological:  Negative for dizziness, tingling, weakness and headaches.    Lab Results  Component Value Date   HGBA1C 8.0 (H) 04/29/2021   HGBA1C 8.6 (A) 12/30/2020   HGBA1C 10.5 (A) 10/12/2020    Lab Results  Component Value Date   MICRALBCREAT 1,861 (H) 10/12/2020    Lipid Panel     Component Value Date/Time   CHOL 144 10/12/2020 1213   TRIG 56 10/12/2020 1213   HDL 73 10/12/2020 1213   CHOLHDL 2.0 10/12/2020 1213   CHOLHDL 2.4 09/10/2019 0404   VLDL 19 09/10/2019 0404   LDLCALC 59 10/12/2020 1213    Clinical Atherosclerotic Cardiovascular Disease (ASCVD): No  The 10-year ASCVD risk score Mikey Bussing DC Jr., et al., 2013) is: 17.9%   Values used to calculate the score:     Age: 26 years     Sex: Female     Is Non-Hispanic African American: Yes     Diabetic: Yes     Tobacco smoker: No     Systolic Blood Pressure: 427 mmHg     Is BP treated: Yes     HDL Cholesterol: 73 mg/dL     Total Cholesterol: 144 mg/dL    Assessment/Plan:   T2DM is mostly controlled per patient home blood glucose readings. Medication adherence appears optimal. Patient tolerating Trulicity well and will increase to 3mg . Provided patient sample of Trulicity 1.5mg  with instructions to take two injections this Thursday to make 3mg  dose. Patient obtains medication through patient assistance. Following instruction patient verbalized understanding of treatment plan.  Increased GLP-1 dulaglutide (Trulicity) to 3mg  once weekly on Thursdays. Pending results of BMP at next office visit can consider adding on SGLT2 for further kidney protection. Extensively discussed pathophysiology of diabetes, dietary effects on blood sugar control, and recommended lifestyle interventions. Counseled on s/sx of and management of hypoglycemia Next A1C anticipated at next provider appointment.  ASCVD risk - primary prevention  in patient with diabetes. Last LDL is controlled. ASCVD risk score is not >20%  -   Continued atorvastatin 40 mg.   Follow-up appointment with provider in four weeks to review sugar readings. Written patient instructions provided.  This appointment required 30 minutes of direct patient care.  Thank you for involving pharmacy to assist in providing this patient's care.  Medication Samples have been provided to the patient.  Drug name: Trulicity       Strength: 1.5mg         Qty: 2 pens  LOT: [id number]  Exp.Date: 10/07/21  Dosing instructions: Inject 1.5mg  twice into skin once weekly  The patient has been instructed regarding the correct time, dose, and frequency of taking this medication, including desired effects and most common side effects.   Hughes Better 10:25 AM 08/18/2021

## 2021-08-18 MED ORDER — TRULICITY 3 MG/0.5ML ~~LOC~~ SOAJ: 3.0000 mg | SUBCUTANEOUS | 0 refills | Status: DC

## 2021-08-18 NOTE — Assessment & Plan Note (Addendum)
T2DM is mostly controlled per patient home blood glucose readings. Medication adherence appears optimal. Patient tolerating Trulicity well and will increase to 3mg . Provided patient sample of Trulicity 1.5mg  with instructions to take two injections this Thursday to make 3mg  dose. Patient obtains medication through patient assistance. Following instruction patient verbalized understanding of treatment plan.    1. Increased GLP-1 dulaglutide (Trulicity) to 3mg  once weekly on Thursdays. 2. Pending results of BMP at next office visit can consider adding on SGLT2 for further kidney protection. 3. Extensively discussed pathophysiology of diabetes, dietary effects on blood sugar control, and recommended lifestyle interventions. 4. Counseled on s/sx of and management of hypoglycemia 5. Next A1C anticipated at next provider appointment.

## 2021-08-18 NOTE — Progress Notes (Signed)
Submitted application for TRULICITY 3MG /0.5ML (INCREASED DOSE) to [employer] for patient assistance.   Phone: [phone]

## 2021-08-23 ENCOUNTER — Ambulatory Visit: Payer: Medicare Other

## 2021-08-31 ENCOUNTER — Telehealth: Payer: Self-pay | Admitting: Dietician

## 2021-08-31 ENCOUNTER — Other Ambulatory Visit: Payer: Medicare Other

## 2021-08-31 ENCOUNTER — Encounter: Payer: Self-pay | Admitting: Gastroenterology

## 2021-08-31 ENCOUNTER — Ambulatory Visit (INDEPENDENT_AMBULATORY_CARE_PROVIDER_SITE_OTHER): Payer: Medicare Other | Admitting: Gastroenterology

## 2021-08-31 VITALS — BP 120/70 | HR 83 | Ht 61.5 in | Wt 137.2 lb

## 2021-08-31 DIAGNOSIS — B9681 Helicobacter pylori [H. pylori] as the cause of diseases classified elsewhere: Secondary | ICD-10-CM

## 2021-08-31 DIAGNOSIS — K2970 Gastritis, unspecified, without bleeding: Secondary | ICD-10-CM | POA: Diagnosis not present

## 2021-08-31 NOTE — Telephone Encounter (Signed)
Amanda [name] requests information on a low potassium diet for her spouse that she intends to follow as well.

## 2021-08-31 NOTE — Progress Notes (Signed)
Review of pertinent gastrointestinal problems: 1. Adenomatous colon polyps, colonoscopy June 2015 Dr. Ardis Hughs for chronic intermittent diarrhea found two subcentimeter adenomas. Recommended recall at 5 year interval.  Colonoscopy January 2020 single subcentimeter polyp was removed and it was not precancerous. 2. Diverticulosis 3. Chronic intermittent diarrhea, colonoscopy June 2015 found normal terminal ileum, random biopsies of the colon showed no microscopic colitis.   Fiber seemed to help in 2015.  Colonoscopy January 2020 single subcentimeter polyp was removed.  Biopsies were taken from the rest of the completely normal colon to check for microscopic colitis.  The random colon biopsies were all normal and the polyp was not a precancerous polyp.  She was recommended to have repeat colonoscopy at 10-year interval.  EGD and slightly elevated TTG level led to upper endoscopy June 2022.  Small to medium amount of retained solid food was noted.  Mild nonspecific gastritis was found and biopsied.  Normal duodenum was biopsied.  Pathology was positive for H. pylori and she was called in a prescription for 10 days of pylera.  Biopsies from duodenum were normal.  She does not have celiac sprue. 4. Abdominal pain, mild unintentional weight loss workup 12/2017: labs 1/2019cmet normal, Hb 10.3 (relatively stable vs previous 4 years).  CT scan with IV and po contrast 12/2017: unrevealing except large stool burden    HPI: This is a very pleasant 70 year old woman whom I last saw at the time of an EGD about 3 months ago.  She had mild nonspecific gastritis and biopsies prove this was H. pylori related.  She was put on antibiotics and completed 2 half courses of antibiotics due to some intolerances that she was feeling.  She overall thinks she feels better since the antibiotics.  Her diarrhea is certainly much better, her indigestion is mostly better as well.  Her weight is overall stable  Blood work July 2022 hemoglobin  10.7, MCV 91.  Creatinine 1.6  ROS: complete GI ROS as described in HPI, all other review negative.  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   Cataract    PRESENT IN LEFT/ CATARACTS REMOVED FROM RIGHT   Cervical radiculopathy due to degenerative joint disease of spine 05/03/2017   CKD (chronic kidney disease) stage 3, GFR 30-59 ml/min (HCC) 01/01/2021   Diabetes mellitus 1996   DKA (diabetic ketoacidoses) 01/02/2013   Gait abnormality 01/10/2019   Glaucoma    Hyperlipidemia    Hypertension    Hypotension    Influenza A (H1N1) 01/03/2013   Nail fungus 10/23/2017   Neuromuscular disorder ([place])    NEUROPATHY   Normocytic anemia 02/14/2013   Osteopenia 05/04/2016   SYNCOPE 05/10/2010   Qualifier: Diagnosis of  By: Marilynne Halsted, RN, BSN, Jacquelyn      Past Surgical History:  Procedure Laterality Date   ABDOMINAL HYSTERECTOMY     10 years ago    BREAST BIOPSY     Cataracts     ROTATOR CUFF REPAIR     SHOULDER SURGERY     Rotator cuff tear     Current Outpatient Medications  Medication Sig Dispense Refill   Accu-Chek Softclix Lancets lancets USE AS DIRECTED UP TO 3 TIMES DAILY (Patient taking differently: USE AS DIRECTED UP TO 3 TIMES DAILY) 300 each 3   acetaminophen (TYLENOL) 325 MG tablet Take 2 tablets (650 mg total)  by mouth every 6 (six) hours as needed (or Fever >/= 101). 60 tablet 0   amLODipine (NORVASC) 5 MG tablet Take 1 tablet (5 mg total) by mouth daily. 30 tablet 11   atorvastatin (LIPITOR) 40 MG tablet Take 1 tablet (40 mg total) by mouth daily. 90 tablet 1   blood glucose meter kit and supplies KIT Dispense based on patient and insurance preference. Use up to four times daily as directed. . 1 each 0   cetirizine (ZYRTEC) 5 MG tablet Take 1 tablet (5 mg total) by mouth daily. 30 tablet 1   Cholecalciferol (VITAMIN D3) 2000 units  capsule Take by mouth daily.      diclofenac Sodium (VOLTAREN) 1 % GEL Apply topically.     dicyclomine (BENTYL) 10 MG capsule Take 1 capsule (10 mg total) by mouth 2 (two) times daily as needed for abdominal cramping. 90 capsule 0   Dulaglutide (TRULICITY) 3 XL/2.4MW SOPN Inject 3 mg as directed once a week. 2 mL 0   Ferrous Sulfate (IRON) 325 (65 Fe) MG TABS Take 1 tablet (325 mg total) by mouth daily. 30 tablet 0   fluticasone (FLONASE) 50 MCG/ACT nasal spray PLACE 2 SPRAYS INTO BOTH NOSTRILS DAILY. 16 g 2   glucose blood test strip CHECK BLOOD SUGAR UP TO 3 TIMES PER DAY (Patient taking differently: CHECK BLOOD SUGAR UP TO 3 TIMES PER DAY) 300 strip 3   Insulin Pen Needle 32G X 4 MM MISC Use to inject insulin daily 100 each 3   Insulin Pen Needle 32G X 4 MM MISC USE AS DIRECTED DAILY 100 each 0   latanoprost (XALATAN) 0.005 % ophthalmic solution 1 drop at bedtime.     losartan (COZAAR) 25 MG tablet Take 1 tablet (25 mg total) by mouth daily. 30 tablet 11   metFORMIN (GLUCOPHAGE) 1000 MG tablet Take 1 tablet (1,000 mg total) by mouth 2 (two) times daily with a meal. 90 tablet 2   Multiple Vitamins-Minerals (WOMENS 50+ MULTI VITAMIN/MIN) TABS Take 1 tablet by mouth daily. 30 tablet    lansoprazole (PREVACID) 30 MG capsule Take 1 capsule (30 mg total) by mouth 2 (two) times daily before a meal for 10 days. 20 capsule 0   omeprazole (PRILOSEC) 20 MG capsule Take 1 capsule (20 mg total) by mouth 2 (two) times daily before a meal for 10 days. 20 capsule 0   No current facility-administered medications for this visit.    Allergies as of 08/31/2021 - Review Complete 08/31/2021  Allergen Reaction Noted   Gabapentin Other (See Comments) 01/17/2014    Family History  Problem Relation Age of Onset   Diabetes Mother    Hypertension Mother    Breast cancer Mother    Cancer Mother    Diabetes Sister    Hypertension Sister    Kidney disease Sister        One Kidney transplant, one sister on HD    Breast cancer Sister    Cancer Sister    Hypertension Sister    Diabetes Sister    Cerebral palsy Brother    Cancer Brother        Unknown   Hypertension Daughter    Colon cancer Neg Hx    Esophageal cancer Neg Hx    Stomach cancer Neg Hx    Rectal cancer Neg Hx     Social History   Socioeconomic History   Marital status: Married    Spouse name: Not on file   Number  of children: 1   Years of education: Not on file   Highest education level: Not on file  Occupational History   Occupation: Retired    Comment: Printmaker  Tobacco Use   Smoking status: Never   Smokeless tobacco: Never  Vaping Use   Vaping Use: Never used  Substance and Sexual Activity   Alcohol use: No    Alcohol/week: 0.0 standard drinks   Drug use: No   Sexual activity: Not Currently    Birth control/protection: Surgical  Other Topics Concern   Not on file  Social History Narrative   Current Social History 04/23/2021        Patient lives with husband in a one level home with ramp      Patient's method of transportation is personal car shared with her mother.      The highest level of education was high school diploma.      The patient currently retired from [employer].   Social Determinants of Health   Financial Resource Strain: Not on file  Food Insecurity: Not on file  Transportation Needs: Not on file  Physical Activity: Not on file  Stress: Not on file  Social Connections: Not on file  Intimate Partner Violence: Not on file     Physical Exam: BP 120/70   Pulse 83   Ht 5' 1.5" (1.562 m)   Wt 137 lb 3.2 oz (62.2 kg)   BMI 25.50 kg/m  Constitutional: generally well-appearing Psychiatric: alert and oriented x3 Abdomen: soft, nontender, nondistended, no obvious ascites, no peritoneal signs, normal bowel sounds No peripheral edema noted in lower extremities  Assessment and plan: 70 y.o. female with H. pylori gastritis  Her GI symptoms are mostly better since she completed  antibiotics for H. pylori 3 months ago.  I recommended stool H. pylori antigen testing to confirm eradication and she will do that now since she actually is not taking any proton pump inhibitors despite her being listed in her medicine list above.  Please see the "Patient Instructions" section for addition details about the plan.  Owens Loffler, MD [facility] 08/31/2021, 3:01 PM   Total time on date of encounter was 25 minutes (this included time spent preparing to see the patient reviewing records; obtaining and/or reviewing separately obtained history; performing a medically appropriate exam and/or evaluation; counseling and educating the patient and family if present; ordering medications, tests or procedures if applicable; and documenting clinical information in the health record).

## 2021-08-31 NOTE — Telephone Encounter (Signed)
Answered her questions and left information on renal and low potassium diet at the front desk for her to pick up on Thursday when she comes for her appointment.

## 2021-08-31 NOTE — Patient Instructions (Addendum)
If you are age 69 or older, your body mass index should be between 23-30. Your Body mass index is 25.5 kg/m. If this is out of the aforementioned range listed, please consider follow up with your Primary Care Provider. __________________________________________________________  The [facility] providers would like to encourage you to use [facility] to communicate with providers for non-urgent requests or questions.  Due to long hold times on the telephone, sending your provider a message by [facility] may be a faster and more efficient way to get a response.  Please allow 48 business hours for a response.  Please remember that this is for non-urgent requests.   Your provider has requested that you go to the basement level for lab work before leaving today. Press "B" on the elevator. The lab is located at the first door on the left as you exit the elevator.  Thank you for entrusting me with your care and choosing [facility].  Dr Ardis Hughs

## 2021-09-01 ENCOUNTER — Other Ambulatory Visit: Payer: Medicare Other

## 2021-09-01 DIAGNOSIS — B9681 Helicobacter pylori [H. pylori] as the cause of diseases classified elsewhere: Secondary | ICD-10-CM

## 2021-09-01 DIAGNOSIS — K2970 Gastritis, unspecified, without bleeding: Secondary | ICD-10-CM

## 2021-09-02 ENCOUNTER — Encounter: Payer: Self-pay | Admitting: Internal Medicine

## 2021-09-02 ENCOUNTER — Other Ambulatory Visit: Payer: Self-pay

## 2021-09-02 ENCOUNTER — Telehealth: Payer: Self-pay | Admitting: Pharmacist

## 2021-09-02 ENCOUNTER — Ambulatory Visit (INDEPENDENT_AMBULATORY_CARE_PROVIDER_SITE_OTHER): Payer: Medicare Other | Admitting: Internal Medicine

## 2021-09-02 VITALS — BP 151/71 | HR 79 | Temp 98.7°F | Resp 24 | Ht 61.5 in | Wt 137.4 lb

## 2021-09-02 DIAGNOSIS — Z23 Encounter for immunization: Secondary | ICD-10-CM

## 2021-09-02 DIAGNOSIS — I10 Essential (primary) hypertension: Secondary | ICD-10-CM

## 2021-09-02 DIAGNOSIS — J0100 Acute maxillary sinusitis, unspecified: Secondary | ICD-10-CM | POA: Diagnosis not present

## 2021-09-02 DIAGNOSIS — Z299 Encounter for prophylactic measures, unspecified: Secondary | ICD-10-CM

## 2021-09-02 DIAGNOSIS — Z794 Long term (current) use of insulin: Secondary | ICD-10-CM | POA: Diagnosis not present

## 2021-09-02 DIAGNOSIS — E1142 Type 2 diabetes mellitus with diabetic polyneuropathy: Secondary | ICD-10-CM | POA: Diagnosis not present

## 2021-09-02 DIAGNOSIS — N1831 Chronic kidney disease, stage 3a: Secondary | ICD-10-CM

## 2021-09-02 LAB — GLUCOSE, CAPILLARY: Glucose-Capillary: 82 mg/dL (ref 70–99)

## 2021-09-02 LAB — POCT GLYCOSYLATED HEMOGLOBIN (HGB A1C): Hemoglobin A1C: 6.9 % — AB (ref 4.0–5.6)

## 2021-09-02 NOTE — Patient Instructions (Addendum)
[name], it was a pleasure seeing you today!  Today we discussed: You stated you were doing great. Your blood pressure was elevated but you said it was doing good on the current medicines but had some stress at home. You forgot to bring your log. I will call you with lab results tomorrow and you can tell me about the log and we can make changes as needed. Your diabetes appears well controlled with current regimen. Your A1c was 6.9. You complained of having allergic symptoms such as runny nose, sinus pain but are not using citrizine or flonase. Please start using them. Drink plenty of water at least 64 oz and start taking fiber tablets. We gave you a flu shot today.   I have ordered the following labs today:  Lab Orders         Glucose, capillary         BMP8+Anion Gap         POC Hbg A1C       Referrals ordered today:   Referral Orders  No referral(s) requested today     I have ordered the following medication/changed the following medications:   Stop the following medications: There are no discontinued medications.   Start the following medications: No orders of the defined types were placed in this encounter.    Follow-up: 3 months   Please make sure to arrive 15 minutes prior to your next appointment. If you arrive late, you may be asked to reschedule.   We look forward to seeing you next time. Please call our clinic at [phone] if you have any questions or concerns. The best time to call is Monday-Friday from 9am-4pm, but there is someone available 24/7. If after hours or the weekend, call the main hospital number and ask for the Internal Medicine Resident On-Call. If you need medication refills, please notify your pharmacy one week in advance and they will send [place] a request.  Thank you for letting us take part in your care. Wishing you the best!  Thank you, Idamae Schuller, MD

## 2021-09-02 NOTE — Telephone Encounter (Signed)
Medication Samples have been provided to the patient.  Drug name: Trulicity       Strength: 1.5        Qty: 2 boxes (4 pens)  LOT: [id number]  Exp.Date: 03/24/23  The patient has been instructed regarding the correct time, dose, and frequency of taking this medication, including desired effects and most common side effects.   Hughes Better 8:57 AM 09/09/2021

## 2021-09-03 LAB — BMP8+ANION GAP
Anion Gap: 16 mmol/L (ref 10.0–18.0)
BUN/Creatinine Ratio: 22 (ref 12–28)
BUN: 31 mg/dL — ABNORMAL HIGH (ref 8–27)
CO2: 19 mmol/L — ABNORMAL LOW (ref 20–29)
Calcium: 9.1 mg/dL (ref 8.7–10.3)
Chloride: 108 mmol/L — ABNORMAL HIGH (ref 96–106)
Creatinine, Ser: 1.42 mg/dL — ABNORMAL HIGH (ref 0.57–1.00)
Glucose: 81 mg/dL (ref 65–99)
Potassium: 4.9 mmol/L (ref 3.5–5.2)
Sodium: 143 mmol/L (ref 134–144)
eGFR: 40 mL/min/{1.73_m2} — ABNORMAL LOW (ref >59)

## 2021-09-03 LAB — H. PYLORI ANTIGEN, STOOL: H pylori Ag, Stl: NEGATIVE

## 2021-09-04 MED ORDER — EMPAGLIFLOZIN 10 MG PO TABS
10.0000 mg | ORAL_TABLET | Freq: Every day | ORAL | 0 refills | Status: DC
  Filled 2021-09-04: qty 90, 90d supply, fill #0
  Filled 2021-09-09: qty 30, 30d supply, fill #0

## 2021-09-04 NOTE — Progress Notes (Signed)
CC: follow up  HPI:  [name] is a 70 y.o. with medical history as below presenting to [facility] for follow up.  Please see problem-based list for further details, assessments, and plans.  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   Cataract    PRESENT IN LEFT/ CATARACTS REMOVED FROM RIGHT   Cervical radiculopathy due to degenerative joint disease of spine 05/03/2017   CKD (chronic kidney disease) stage 3, GFR 30-59 ml/min (HCC) 01/01/2021   Diabetes mellitus 1996   DKA (diabetic ketoacidoses) 01/02/2013   Gait abnormality 01/10/2019   Glaucoma    Hyperlipidemia    Hypertension    Hypotension    Influenza A (H1N1) 01/03/2013   Nail fungus 10/23/2017   Neuromuscular disorder ([place])    NEUROPATHY   Normocytic anemia 02/14/2013   Osteopenia 05/04/2016   SYNCOPE 05/10/2010   Qualifier: Diagnosis of  By: Marilynne Halsted RN, BSN, Jacquelyn     Review of Systems:  Review of system negative unless stated in the problem list or HPI.    Physical Exam:  Vitals:   09/02/21 1314 09/02/21 1321  BP: (!) 163/89 (!) 151/71  Pulse: 81 79  Resp: (!) 24   Temp: 98.7 F (37.1 C)   TempSrc: Oral   SpO2: 100%   Weight: 137 lb 6.4 oz (62.3 kg)   Height: 5' 1.5" (1.562 m)     Physical Exam Constitutional:      General: She is not in acute distress.    Appearance: Normal appearance. She is not ill-appearing.  HENT:     Head: Normocephalic and atraumatic.     Comments: Frontal and ethmoid sinus areas tender to palpation.     Right Ear: External ear normal.     Left Ear: External ear normal.     Nose: Congestion present.     Mouth/Throat:     Mouth: Mucous membranes are moist.     Pharynx: Oropharynx is clear.  Eyes:     Extraocular Movements: Extraocular movements intact.     Conjunctiva/sclera: Conjunctivae normal.     Pupils: Pupils are equal, round, and reactive to light.  Cardiovascular:      Rate and Rhythm: Normal rate and regular rhythm.     Pulses: Normal pulses.     Heart sounds: Normal heart sounds. No murmur heard.   No friction rub. No gallop.  Pulmonary:     Effort: Pulmonary effort is normal. No respiratory distress.     Breath sounds: Normal breath sounds. No stridor. No wheezing or rhonchi.  Abdominal:     General: Bowel sounds are normal. There is no distension.     Palpations: Abdomen is soft. There is no mass.  Musculoskeletal:        General: No swelling or tenderness. Normal range of motion.     Cervical back: Normal range of motion and neck supple.  Skin:    Capillary Refill: Capillary refill takes less than 2 seconds.     Coloration: Skin is not jaundiced.     Findings: No erythema, lesion or rash.  Neurological:     General: No focal deficit present.     Mental Status: She is alert and oriented to person, place, and time. Mental status is at baseline.  Psychiatric:        Mood and Affect: Mood normal.  Behavior: Behavior normal.    Assessment & Plan:   See Encounters Tab for problem based charting.  Patient seen with Dr. Roderic Ovens, MD

## 2021-09-04 NOTE — Assessment & Plan Note (Addendum)
Assessment: Patient's blood pressure is well controlled at home. It was high in the clinic this visit. She forgot her log and was called the next day to get readings off majority which were <130/80. Due to her hyperkalemia, we will stop her losartan and start an SGLT2 inhibitor for bp control and kidney protective properties. Counseled on risks and benefits of starting SGLT2 inhibitor. Advised drinking water with target of 2-3 liters per day.   Plan: -Continue Norvasc 5 mg  -Stop Losartan 25 mg -Start Jardiance 10 mg daily

## 2021-09-04 NOTE — Assessment & Plan Note (Signed)
>>  ASSESSMENT AND PLAN FOR CKD (CHRONIC KIDNEY DISEASE), STAGE IV (HCC) WRITTEN ON 09/04/2021  1:33 AM BY [name], MD  Assessment: Repeat BMP shows improvement with Creatine of 1.42 with GFR of 40. We will discontinue Losartan due to previous hyperkalemia that has since resolved and will add Jardiance.   Plan: -Start Jardiance 10 mg for renal protection.  -Control BP and diabetes

## 2021-09-04 NOTE — Assessment & Plan Note (Signed)
Received flu shot today. 

## 2021-09-04 NOTE — Assessment & Plan Note (Signed)
Assessment: Patient had feeling of ear popping, congestion, and sinus pain. When inquired if she is taking her zyrtec and using Flonase, she stated she was not doing those and only using neti rinses. Advised on restarting Flonase and Zyrtec.   Plan: -Restart flonase and zyrtec.  -Follow up if symptoms don't improve.

## 2021-09-04 NOTE — Assessment & Plan Note (Signed)
Assessment: Repeat BMP shows improvement with Creatine of 1.42 with GFR of 40. We will discontinue Losartan due to previous hyperkalemia that has since resolved and will add Jardiance.   Plan: -Start Jardiance 10 mg for renal protection.  -Control BP and diabetes

## 2021-09-04 NOTE — Assessment & Plan Note (Signed)
Assessment:  Patient has diabetes and is on Trulicity 3 mg weekly and metformin 1000 mg BID. We will add Jardiance which will help with diabetes management as well help with blood pressure and renal protection. A1c today was 6.9 improved from 8.0 previously.   Plan: -Continue Trulicity 3 mg weekly -Continue metformin 1 g BID -Start Jardiance 10 mg daily.

## 2021-09-06 ENCOUNTER — Other Ambulatory Visit (HOSPITAL_COMMUNITY): Payer: Self-pay

## 2021-09-07 ENCOUNTER — Telehealth: Payer: Self-pay

## 2021-09-07 ENCOUNTER — Other Ambulatory Visit (HOSPITAL_COMMUNITY): Payer: Self-pay

## 2021-09-07 DIAGNOSIS — E1142 Type 2 diabetes mellitus with diabetic polyneuropathy: Secondary | ICD-10-CM

## 2021-09-07 DIAGNOSIS — Z794 Long term (current) use of insulin: Secondary | ICD-10-CM

## 2021-09-07 MED ORDER — METFORMIN HCL 500 MG PO TABS
500.0000 mg | ORAL_TABLET | Freq: Two times a day (BID) | ORAL | 11 refills | Status: DC
  Filled 2021-09-07: qty 90, 45d supply, fill #0
  Filled 2021-10-19: qty 90, 45d supply, fill #1

## 2021-09-07 NOTE — Telephone Encounter (Signed)
Questions about taking empagliflozin (JARDIANCE) 10 MG TABS tablet. Please call pt back.

## 2021-09-07 NOTE — Telephone Encounter (Signed)
Amanda [name] from [facility] called regarding Rx for metformin 1000 mg BID. States with her creatinine and GFR max dose is 500 mg BID. Please advise.

## 2021-09-07 NOTE — Telephone Encounter (Signed)
Called and spoke with patient about decreasing metformin from 1000 mg BID to 500 mg BID.  Sent in updated prescription.  She was unable to pick up Jardiance 10 mg due to how expensive it was.  Will reach out to Dr. Georgina Peer to see if there is a Biochemist, clinical.

## 2021-09-07 NOTE — Progress Notes (Signed)
Internal Medicine Clinic Attending  Case discussed with Dr. Khan  At the time of the visit.  We reviewed the resident's history and exam and pertinent patient test results.  I agree with the assessment, diagnosis, and plan of care documented in the resident's note.  

## 2021-09-07 NOTE — Assessment & Plan Note (Signed)
Contacted by pharmacy due to patient's GFR of 40.  Will decrease metformin dosage to metformin 500 mg BID. Called and talked with patient about this change.

## 2021-09-09 ENCOUNTER — Other Ambulatory Visit (HOSPITAL_COMMUNITY): Payer: Self-pay

## 2021-09-09 MED ORDER — DAPAGLIFLOZIN PROPANEDIOL 5 MG PO TABS
5.0000 mg | ORAL_TABLET | Freq: Every day | ORAL | 3 refills | Status: DC
  Filled 2021-09-09 – 2021-10-27 (×2): qty 30, 30d supply, fill #0

## 2021-09-16 ENCOUNTER — Other Ambulatory Visit (HOSPITAL_COMMUNITY): Payer: Self-pay

## 2021-09-20 ENCOUNTER — Telehealth: Payer: Self-pay | Admitting: Pharmacist

## 2021-09-20 NOTE — Telephone Encounter (Signed)
Patient left voicemail on 09/17/21 stating her blood glucose readings were increasing since decreasing her metformin to 500mg  BID.  Called patient back and she reports that her blood glucose was previously in the low 100's and since decreasing her metformin as instructed her glucose went into the 130's. She was concerned about this so Sunday she took 1000mg  BID (her old dose) and she took 1000mg  this morning.   Discussed with patient that having readings in the 130's is acceptable and that we are working towards getting her on an SGLT2 and titrating up her GLP1 (Trulicity) which should all help normalize her glucose again. Reminded patient her dose was decreased due to her kidney function and that it is important she not take more than 1000mg  of metformin daily. Instructed patient to hold off on evening dose as she has already taken 1000mg  this morning.   Patient also brought up concern on phone that Trulicity has been causing her to feel itchy. She reports this occurs around 1-2 days after taking the medication. She denies any redness at injection site. She states the itchiness occurs less often when she injects in the stomach and more often when she injects in the arm or thigh. Patient reports she will rub lotion or baby oil on injection site after to try to help minimize itching that she anticipates will occur, and states despite this she still winds up feeling itchy. Since patient is not experiencing any redness and itching is delayed instructed patient to try injecting in stomach again this week and to avoid applying anything to area after injecting and to leave injection site alone. Also instructed patient to record time of injection and then time of itchiness if it occurs. Patient verbalized understanding and will report back information to me.

## 2021-09-21 ENCOUNTER — Other Ambulatory Visit (HOSPITAL_COMMUNITY): Payer: Self-pay

## 2021-09-21 NOTE — Progress Notes (Signed)
Resubmitting dose increase (trulicity 3mg ) to [employer]). Previous fax number was incorrect. Will contact pt as well.

## 2021-09-24 ENCOUNTER — Other Ambulatory Visit: Payer: Medicare Other

## 2021-09-30 ENCOUNTER — Ambulatory Visit (INDEPENDENT_AMBULATORY_CARE_PROVIDER_SITE_OTHER): Payer: Medicare Other | Admitting: Pharmacist

## 2021-09-30 ENCOUNTER — Other Ambulatory Visit (HOSPITAL_COMMUNITY): Payer: Self-pay

## 2021-09-30 DIAGNOSIS — I10 Essential (primary) hypertension: Secondary | ICD-10-CM | POA: Diagnosis not present

## 2021-09-30 NOTE — Progress Notes (Addendum)
   Subjective:    Patient ID: Amanda [name], female    DOB: 29-Apr-1951, 70 y.o.   MRN: [id number]  HPI Patient is a 70 y.o. female who presents for hypertension management. She is in good spirits and presents without assistance. Patient was referred on 05/26/21 and last seen by Primary Care Provider on 09/02/21.  Hypertension ROS: taking medications as instructed, no medication side effects noted, home BP monitoring in range of 175'Z systolic over 02'H diastolic, no chest pain on exertion, no dyspnea on exertion, no swelling of ankles, and no orthostatic dizziness or lightheadedness.   Objective:   Last 3 Office BP readings: BP Readings from Last 3 Encounters:  09/02/21 (!) 151/71  08/31/21 120/70  07/06/21 123/70    BMET    Component Value Date/Time   NA 144 09/30/2021 1423   K 5.1 09/30/2021 1423   CL 106 09/30/2021 1423   CO2 22 09/30/2021 1423   GLUCOSE 102 (H) 09/30/2021 1423   GLUCOSE 217 (H) 06/10/2020 1221   BUN 30 (H) 09/30/2021 1423   CREATININE 1.49 (H) 09/30/2021 1423   CREATININE 0.97 06/16/2015 1553   CALCIUM 9.3 09/30/2021 1423   GFRNONAA 38 (L) 12/30/2020 1349   GFRNONAA 63 06/16/2015 1553   GFRAA 43 (L) 12/30/2020 1349   GFRAA 72 06/16/2015 1553    Renal function: CrCl cannot be calculated (Patient's most recent lab result is older than the maximum 21 days allowed.).  Clinical ASCVD: No  The 10-year ASCVD risk score (Arnett DK, et al., 2019) is: 26%   Values used to calculate the score:     Age: 77 years     Sex: Female     Is Non-Hispanic African American: Yes     Diabetic: Yes     Tobacco smoker: No     Systolic Blood Pressure: 852 mmHg     Is BP treated: Yes     HDL Cholesterol: 73 mg/dL     Total Cholesterol: 144 mg/dL  There were no vitals taken for this visit.  Appearance alert, well appearing, and in no distress. General exam BP noted to be well controlled today in office, BP noted to be mildly elevated today in office  Assessment/Plan:   Hypertension asymptomatic, poorly controlled, and needs further observation.  Repeat labs ordered prior to next appointment. Follow up: 1 month and as needed..  -Continued losartan 25mg  -Counseled on lifestyle modifications for blood pressure control including reduced dietary sodium, increased exercise, adequate sleep.  Results reviewed and written information provided.   Total time in face-to-face counseling 20 minutes.    Addendum: Spoke with patient on phone on 10/22/21 and she confirmed she is in fact also taking amlodipine 5mg  after reporting she was only taking losartan 25mg . Due to persistently elevated potassium will have patient discontinue losartan 25mg  and continue amlodipine 5mg . Patient instucted to check BP and bring readings to upcoming PCP visit. Provided ED precautions. Will have PCP re-check BP while patient is only on amlodipine 5mg .

## 2021-09-30 NOTE — Patient Instructions (Signed)
Amanda [name] it was a pleasure seeing you today.   Your blood pressure today is well-controlled .   Continue losartan and I will call if you need to stop this medication and switch back to your amlodipine   Start Trulicity 3mg  pens once weekly on Thursdays  Limiting salt intake and caffeine.  Exercising as able for at least 30 minutes for 5 days out of the week, can also help you lower your blood pressure.  Take your blood pressure at home if you are able. Please write down these numbers and bring them to your visits.  If you have any questions please call me

## 2021-10-01 ENCOUNTER — Other Ambulatory Visit (HOSPITAL_COMMUNITY): Payer: Self-pay

## 2021-10-01 LAB — BASIC METABOLIC PANEL
BUN/Creatinine Ratio: 20 (ref 12–28)
BUN: 30 mg/dL — ABNORMAL HIGH (ref 8–27)
CO2: 22 mmol/L (ref 20–29)
Calcium: 9.3 mg/dL (ref 8.7–10.3)
Chloride: 106 mmol/L (ref 96–106)
Creatinine, Ser: 1.49 mg/dL — ABNORMAL HIGH (ref 0.57–1.00)
Glucose: 102 mg/dL — ABNORMAL HIGH (ref 70–99)
Potassium: 5.1 mmol/L (ref 3.5–5.2)
Sodium: 144 mmol/L (ref 134–144)
eGFR: 38 mL/min/{1.73_m2} — ABNORMAL LOW (ref >59)

## 2021-10-08 ENCOUNTER — Other Ambulatory Visit (HOSPITAL_COMMUNITY): Payer: Self-pay

## 2021-10-08 MED FILL — Glucose Blood Test Strip: 83 days supply | Qty: 250 | Fill #1 | Status: AC

## 2021-10-12 NOTE — Progress Notes (Signed)
Submitted application for [employer] to [employer]) for patient assistance.   Phone: [phone]

## 2021-10-13 ENCOUNTER — Other Ambulatory Visit (HOSPITAL_COMMUNITY): Payer: Self-pay

## 2021-10-19 ENCOUNTER — Other Ambulatory Visit (HOSPITAL_COMMUNITY): Payer: Self-pay

## 2021-10-22 ENCOUNTER — Telehealth: Payer: Self-pay

## 2021-10-22 NOTE — Telephone Encounter (Signed)
Pt contacted by pharmacy staff No further action needed Phone call complete.[name]

## 2021-10-22 NOTE — Telephone Encounter (Signed)
Rtc to patient No answer HIPPA compliant message left on recorder. Despina Hidden Cassady11/4/202212:02 PM

## 2021-10-22 NOTE — Telephone Encounter (Signed)
Questions about meds, please call pt back. 

## 2021-10-26 NOTE — Progress Notes (Signed)
Subjective:  CC: follow-up for HTN, DM, lump in breast that she felt yesterday and abnormal ABIs  HPI:  [name] is a 70 y.o. female with a past medical history stated below and presents today for follow-up for HTN, DM, lump in breast that she felt yesterday and abnormal ABIs. Please see problem based assessment and plan for additional details.  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   Cataract    PRESENT IN LEFT/ CATARACTS REMOVED FROM RIGHT   Cervical radiculopathy due to degenerative joint disease of spine 05/03/2017   CKD (chronic kidney disease) stage 3, GFR 30-59 ml/min (HCC) 01/01/2021   Diabetes mellitus 1996   DKA (diabetic ketoacidoses) 01/02/2013   Gait abnormality 01/10/2019   Glaucoma    Hyperlipidemia    Hypertension    Hypotension    Influenza A (H1N1) 01/03/2013   Nail fungus 10/23/2017   Neuromuscular disorder ([place])    NEUROPATHY   Normocytic anemia 02/14/2013   Osteopenia 05/04/2016   SYNCOPE 05/10/2010   Qualifier: Diagnosis of  By: Marilynne Halsted RN, BSN, Jacquelyn      Current Outpatient Medications on File Prior to Visit  Medication Sig Dispense Refill   Accu-Chek Softclix Lancets lancets USE AS DIRECTED UP TO 3 TIMES DAILY (Patient taking differently: USE AS DIRECTED UP TO 3 TIMES DAILY) 300 each 3   acetaminophen (TYLENOL) 325 MG tablet Take 2 tablets (650 mg total) by mouth every 6 (six) hours as needed (or Fever >/= 101). 60 tablet 0   amLODipine (NORVASC) 5 MG tablet Take 1 tablet (5 mg total) by mouth daily. 30 tablet 11   atorvastatin (LIPITOR) 40 MG tablet Take 1 tablet (40 mg total) by mouth daily. 90 tablet 1   blood glucose meter kit and supplies KIT Dispense based on patient and insurance preference. Use up to four times daily as directed. . 1 each 0   cetirizine (ZYRTEC) 5 MG tablet Take 1 tablet (5 mg total) by mouth daily. 30 tablet 1    Cholecalciferol (VITAMIN D3) 2000 units capsule Take by mouth daily.      dapagliflozin propanediol (FARXIGA) 5 MG TABS tablet Take 1 tablet (5 mg total) by mouth daily. 30 tablet 3   diclofenac Sodium (VOLTAREN) 1 % GEL Apply topically.     Dulaglutide (TRULICITY) 3 MK/3.4JZ SOPN Inject 3 mg as directed once a week. 2 mL 0   Ferrous Sulfate (IRON) 325 (65 Fe) MG TABS Take 1 tablet (325 mg total) by mouth daily. 30 tablet 0   fluticasone (FLONASE) 50 MCG/ACT nasal spray PLACE 2 SPRAYS INTO BOTH NOSTRILS DAILY. 16 g 2   glucose blood test strip CHECK BLOOD SUGAR UP TO 3 TIMES PER DAY (Patient taking differently: CHECK BLOOD SUGAR UP TO 3 TIMES PER DAY) 300 strip 3   Insulin Pen Needle 32G X 4 MM MISC Use to inject insulin daily 100 each 3   Insulin Pen Needle 32G X 4 MM MISC USE AS DIRECTED DAILY 100 each 0   latanoprost (XALATAN) 0.005 % ophthalmic solution 1 drop at bedtime.     losartan (COZAAR) 25 MG tablet Take 1 tablet (25 mg total) by mouth daily. 30 tablet 11   metFORMIN (GLUCOPHAGE) 500 MG tablet Take 1 tablet (500 mg total) by mouth 2 (two) times daily. 90 tablet 11   Multiple Vitamins-Minerals (WOMENS 50+ MULTI  VITAMIN/MIN) TABS Take 1 tablet by mouth daily. 30 tablet    [DISCONTINUED] glipiZIDE (GLUCOTROL) 5 MG tablet Take 0.5 tablets (2.5 mg total) by mouth daily before breakfast. 15 tablet 0   No current facility-administered medications on file prior to visit.    Family History  Problem Relation Age of Onset   Diabetes Mother    Hypertension Mother    Breast cancer Mother    Cancer Mother    Diabetes Sister    Hypertension Sister    Kidney disease Sister        One Kidney transplant, one sister on HD   Breast cancer Sister    Cancer Sister    Hypertension Sister    Diabetes Sister    Cerebral palsy Brother    Cancer Brother        Unknown   Hypertension Daughter    Colon cancer Neg Hx    Esophageal cancer Neg Hx    Stomach cancer Neg Hx    Rectal cancer Neg Hx      Social History   Socioeconomic History   Marital status: Married    Spouse name: Not on file   Number of children: 1   Years of education: Not on file   Highest education level: Not on file  Occupational History   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      The highest level of education was high school diploma.      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   Social Determinants of Health   Financial Resource Strain: Not on file  Food Insecurity: Not on file  Transportation Needs: Not on file  Physical Activity: Not on file  Stress: Not on file  Social Connections: Not on file  Intimate Partner Violence: Not on file    Review of Systems: ROS negative except for what is noted on the assessment and plan.  Objective:   Vitals:   10/27/21 1407  BP: (!) 166/77  Pulse: 85  SpO2: 100%  Weight: 139 lb (63 kg)    Physical Exam: Gen: A&O x3 and in no apparent distress, well appearing and nourished. HEENT:    Head - normocephalic, atraumatic.    Eye - visual acuity grossly intact, conjunctiva clear, sclera non-icteric, EOM intact.    Mouth - No obvious caries or periodontal disease. Neck: no masses or nodules, AROM intact. Breast: 3 cm, mobile, hard mass in the right breast at about 9 o'clock position.  No signs of dimpling, retraction, edema, or ulceration.  CV: RRR, no murmurs, S1/S2 presents  Resp: Clear to ascultation bilaterally   Abd: BS (+) x4, soft, non-tender abdomen, without hepatosplenomegaly or masses MSK: Grossly normal AROM and strength x4 extremities. Skin: good skin turgor, no rashes, unusual bruising, or prominent lesions.  Neuro: No focal deficits, grossly normal sensation and coordination.    Psych: Oriented x3 and responding appropriately. Intact memory, normal mood, judgement, affect, and insight.    Assessment & Plan:  See Encounters Tab for problem based charting.  Patient seen with Dr. Narendra    , D.O. [facility]  PGY-1 Pager: [phone]  Phone: [phone] Date 10/28/2021  Time 7:37 AM  

## 2021-10-27 ENCOUNTER — Ambulatory Visit (INDEPENDENT_AMBULATORY_CARE_PROVIDER_SITE_OTHER): Payer: Medicare Other | Admitting: Internal Medicine

## 2021-10-27 ENCOUNTER — Other Ambulatory Visit (HOSPITAL_COMMUNITY): Payer: Self-pay

## 2021-10-27 VITALS — BP 166/77 | HR 85 | Wt 139.0 lb

## 2021-10-27 DIAGNOSIS — I10 Essential (primary) hypertension: Secondary | ICD-10-CM | POA: Diagnosis not present

## 2021-10-27 DIAGNOSIS — I739 Peripheral vascular disease, unspecified: Secondary | ICD-10-CM

## 2021-10-27 DIAGNOSIS — Z13820 Encounter for screening for osteoporosis: Secondary | ICD-10-CM | POA: Diagnosis not present

## 2021-10-27 DIAGNOSIS — N1832 Chronic kidney disease, stage 3b: Secondary | ICD-10-CM | POA: Diagnosis not present

## 2021-10-27 DIAGNOSIS — E1142 Type 2 diabetes mellitus with diabetic polyneuropathy: Secondary | ICD-10-CM

## 2021-10-27 DIAGNOSIS — Z794 Long term (current) use of insulin: Secondary | ICD-10-CM

## 2021-10-27 DIAGNOSIS — Z23 Encounter for immunization: Secondary | ICD-10-CM | POA: Diagnosis not present

## 2021-10-27 DIAGNOSIS — N6313 Unspecified lump in the right breast, lower outer quadrant: Secondary | ICD-10-CM

## 2021-10-27 MED ORDER — ASPIRIN EC 81 MG PO TBEC
81.0000 mg | DELAYED_RELEASE_TABLET | Freq: Every day | ORAL | 2 refills | Status: DC
  Filled 2021-10-27: qty 90, 90d supply, fill #0

## 2021-10-27 MED ORDER — ZOSTER VAC RECOMB ADJUVANTED 50 MCG/0.5ML IM SUSR: 0.5000 mL | Freq: Once | INTRAMUSCULAR | 0 refills | Status: AC

## 2021-10-27 MED ORDER — CHLORTHALIDONE 25 MG PO TABS
25.0000 mg | ORAL_TABLET | Freq: Every day | ORAL | 0 refills | Status: DC
  Filled 2021-10-27 (×2): qty 30, 30d supply, fill #0

## 2021-10-27 NOTE — Patient Instructions (Signed)
Amanda [name], it was a pleasure seeing you today!  Today we discussed: Blood pressure- Your blood pressure was elevated today at 166/87. Please continue taking Norvasc 5 mg. I sent in Chlorthiadone 25 mg as well. You have been on this medication in the past. I would like you to follow0up in four weeks to recheck blood pressure.  Peripheral Artery Disease Please start taking 81 mg aspirin once daily.  Kidney function I am referring you to a kidney specialist.  You kidney function has slowly worsened and I want to get a specialist involved early.  Diabetes Please continue taking Farxiga 5 mg and Metformin 500 mg  Preventative medicine: DEXA scan Shingrix   I have ordered the following labs today:  Lab Orders  No laboratory test(s) ordered today     Referrals ordered today:    Referral Orders         Ambulatory referral to Nephrology      I have ordered the following medication/changed the following medications:   Stop the following medications: There are no discontinued medications.   Start the following medications: Meds ordered this encounter  Medications   Zoster Vaccine Adjuvanted [facility]) injection    Sig: Inject 0.5 mLs into the muscle once for 1 dose.    Dispense:  0.5 mL    Refill:  0   chlorthalidone (HYGROTON) 25 MG tablet    Sig: Take 1 tablet (25 mg total) by mouth daily.    Dispense:  30 tablet    Refill:  0   aspirin EC 81 MG tablet    Sig: Take 1 tablet (81 mg total) by mouth daily. Swallow whole.    Dispense:  150 tablet    Refill:  2     Follow-up:  4 weeks    Please make sure to arrive 15 minutes prior to your next appointment. If you arrive late, you may be asked to reschedule.   We look forward to seeing you next time. Please call our clinic at [phone] if you have any questions or concerns. The best time to call is Monday-Friday from 9am-4pm, but there is someone available 24/7. If after hours or the weekend, call the main  hospital number and ask for the Internal Medicine Resident On-Call. If you need medication refills, please notify your pharmacy one week in advance and they will send [place] a request.  Thank you for letting us take part in your care. Wishing you the best!  Thank you, Dr. Hel[facility]

## 2021-10-27 NOTE — Progress Notes (Signed)
Pt was called / informed to call the [facility] @ [phone] to schedule her diagnostic mammogram and bone density scan. Stated she understands.

## 2021-10-28 ENCOUNTER — Other Ambulatory Visit (HOSPITAL_COMMUNITY): Payer: Self-pay

## 2021-10-28 DIAGNOSIS — N6313 Unspecified lump in the right breast, lower outer quadrant: Secondary | ICD-10-CM | POA: Insufficient documentation

## 2021-10-28 DIAGNOSIS — Z13820 Encounter for screening for osteoporosis: Secondary | ICD-10-CM | POA: Insufficient documentation

## 2021-10-28 DIAGNOSIS — E1151 Type 2 diabetes mellitus with diabetic peripheral angiopathy without gangrene: Secondary | ICD-10-CM | POA: Insufficient documentation

## 2021-10-28 DIAGNOSIS — Z23 Encounter for immunization: Secondary | ICD-10-CM

## 2021-10-28 DIAGNOSIS — I739 Peripheral vascular disease, unspecified: Secondary | ICD-10-CM | POA: Insufficient documentation

## 2021-10-28 HISTORY — DX: Encounter for screening for osteoporosis: Z13.820

## 2021-10-28 HISTORY — DX: Encounter for immunization: Z23

## 2021-10-28 NOTE — Assessment & Plan Note (Signed)
Ankle-brachial index completed at home by [facility] nurse.  Right leg with ABI of 0.82.  Left leg of 1.13.  Patient denies symptoms of clinical dictation including pain in her leg.  She is already taking atorvastatin 40 mg.  Plan: - Start aspirin 81 mg daily

## 2021-10-28 NOTE — Assessment & Plan Note (Signed)
Patient was given prescription to receive Shingrix vaccine and instructed to go to local pharmacy to receive this.

## 2021-10-28 NOTE — Assessment & Plan Note (Signed)
>>  ASSESSMENT AND PLAN FOR CKD (CHRONIC KIDNEY DISEASE), STAGE IV (HCC) WRITTEN ON 10/28/2021  7:23 AM BY [name], KATIE, DO  Patient has not been able to start SGLT2 inhibitor.  Spoke with her about steps to obtain co-pay card for [place].  Creatinine 1.42, GFR 40.  Chronic kidney disease thought to be from uncontrolled hypertension and longstanding diabetes.  Plan: - Referral to nephrology -Start Farxiga 5 mg - Work towards controlling blood pressure and diabetes

## 2021-10-28 NOTE — Progress Notes (Signed)
Internal Medicine Clinic Attending  I saw and evaluated the patient.  I personally confirmed the key portions of the history and exam documented by Dr. [name] and I reviewed pertinent patient test results.  The assessment, diagnosis, and plan were formulated together and I agree with the documentation in the resident's note.  

## 2021-10-28 NOTE — Assessment & Plan Note (Signed)
DEXA scan

## 2021-10-28 NOTE — Assessment & Plan Note (Addendum)
Patient has not been able to start SGLT2 inhibitor.  Spoke with her about steps to obtain co-pay card for [place].  Creatinine 1.42, GFR 40.  Chronic kidney disease thought to be from uncontrolled hypertension and longstanding diabetes.  Plan: - Referral to nephrology -Start Farxiga 5 mg - Work towards controlling blood pressure and diabetes

## 2021-10-28 NOTE — Assessment & Plan Note (Signed)
Patient presents with lump in her right outer breast that she noticed last night.  Her last mammogram was April of this year with normal results.  She has never had abnormal mammogram results, but does have a family history of breast cancer in her mother and grandmother.   On physical exam, there is a 3 cm, mobile, hard mass in the right breast at about 9 o'clock position.  No signs of dimpling, retraction, edema, or ulceration.   Plan: - Diagnostic mammogram

## 2021-10-28 NOTE — Assessment & Plan Note (Addendum)
Patient presents with high blood pressure at 166/77.  She states that at home when she takes her BP, the systolic numbers usually in the 140s-150s and she is unsure of the diastolic number.  Has had multiple changes to her blood pressure medications over the last 4 months.  She has been on ACE inhibitors in the past, but was discontinued due to chronic cough.  She was placed on an ARB in the past and had hyperkalemia following initiation and medication was discontinued.  Also had episodes of dizziness over the summer and chlorthalidone was discontinued at that time, along with lisinopril.   BP Readings from Last 3 Encounters:  10/27/21 (!) 166/77  09/02/21 (!) 151/71  08/31/21 120/70   Plan: Blood pressure not at goal of less than 130/80.  She states that she has been taking amlodipine 5 mg daily.  She is taking chlorthalidone past and is willing to try taking it again.  -Restart chlorthalidone 25 mg -Continue amlodipine 5 mg -Follow-up in 4 weeks -BMP at follow-up visit

## 2021-10-28 NOTE — Assessment & Plan Note (Signed)
Last A1c in September at 6.9.  She states that she occasionally checks her blood sugar at home about twice a week and it is usually in the mid 100s.  Her metformin dosage was decreased due to GFR and she has been taking metformin 500 mg twice daily.  She was prescribed an SGLT2 inhibitor, however Jardiance was changed to [place] at the pharmacy due to expense.  I called and talked with outpatient pharmacy and they stated that still cost her around $70 for Farxiga unless she applied for the co-pay card.  I spoke with patient about how she can get the co-pay card online.  She does not have a computer at home but is able to go to a [employer] to do this.  I asked her if she had difficulty to call the clinic.  Plan: - Continue metformin 500 mg twice daily - Trulicity 3 mg injection weekly, Amanda [name] is working with Dr. Claiborne Billings for patient assistance on this medication. - Start Farxiga 5 mg

## 2021-11-02 ENCOUNTER — Other Ambulatory Visit: Payer: Self-pay | Admitting: Internal Medicine

## 2021-11-02 DIAGNOSIS — N6313 Unspecified lump in the right breast, lower outer quadrant: Secondary | ICD-10-CM

## 2021-11-03 ENCOUNTER — Other Ambulatory Visit: Payer: Self-pay

## 2021-11-03 ENCOUNTER — Ambulatory Visit (INDEPENDENT_AMBULATORY_CARE_PROVIDER_SITE_OTHER): Payer: Medicare Other | Admitting: Podiatry

## 2021-11-03 DIAGNOSIS — M79605 Pain in left leg: Secondary | ICD-10-CM | POA: Diagnosis not present

## 2021-11-03 DIAGNOSIS — L84 Corns and callosities: Secondary | ICD-10-CM | POA: Diagnosis not present

## 2021-11-03 DIAGNOSIS — M79604 Pain in right leg: Secondary | ICD-10-CM | POA: Diagnosis not present

## 2021-11-03 DIAGNOSIS — E1142 Type 2 diabetes mellitus with diabetic polyneuropathy: Secondary | ICD-10-CM | POA: Diagnosis not present

## 2021-11-07 ENCOUNTER — Encounter: Payer: Self-pay | Admitting: Podiatry

## 2021-11-07 NOTE — Progress Notes (Signed)
  Subjective:  Patient ID: Amanda [name], female    DOB: 12/18/1951,  MRN: [id number]  Amanda [name] presents to clinic today for at risk foot care with history of peripheral neuropathy and corn(s) right 5th toe and painful thick toenails that are difficult to trim. Painful toenails interfere with ambulation. Aggravating factors include wearing enclosed shoe gear. Pain is relieved with periodic professional debridement. Painful corns are aggravated when weightbearing when wearing enclosed shoe gear. Pain is relieved with periodic professional debridement.  Patient states blood glucose was 147 mg/dl today.    PCP is [name], Joellen Jersey, DO , and last visit was "last week".  Allergies  Allergen Reactions   Gabapentin Other (See Comments)    Pt states medication made her feel "out of it".   Pt states medication made her feel "out of it".      Review of Systems: Negative except as noted in the HPI. Objective:   Constitutional Amanda [name] is a pleasant 70 y.o. African American female, WD, WN in NAD. AAO x 3.   Vascular CFT immediate b/l LE. Palpable DP/PT pulses b/l LE. Digital hair present b/l. Skin temperature gradient WNL b/l. No pain with calf compression b/l. No edema noted b/l. No cyanosis or clubbing noted b/l LE. No cyanosis or clubbing noted.  Neurologic Normal speech. Oriented to person, place, and time. Pt has subjective symptoms of neuropathy. Protective sensation intact 5/5 intact bilaterally with 10g monofilament b/l. Proprioception intact bilaterally.  Dermatologic Pedal integument with normal turgor, texture and tone BLE. No open wounds b/l LE. No interdigital macerations noted b/l LE. Toenails 1-5 b/l elongated, discolored, dystrophic, thickened, crumbly with subungual debris and tenderness to dorsal palpation. Hyperkeratotic lesion(s) R 5th toe.  No erythema, no edema, no drainage, no fluctuance.  Orthopedic: Muscle strength 5/5 to all lower extremity muscle groups bilaterally.  HAV with bunion deformity noted b/l LE. Hammertoe(s) noted to the R 5th toe.   Radiographs: None Assessment:   1. Pain in both lower extremities   2. Corns   3. Diabetic peripheral neuropathy associated with type 2 diabetes mellitus ([place])    Plan:  Patient was evaluated and treated and all questions answered. Consent given for treatment as described below: -No new findings. No new orders. -Continue foot and shoe inspections daily. Monitor blood glucose per PCP/Endocrinologist's recommendations. -Mycotic toenails 1-5 bilaterally were debrided in length and girth with sterile nail nippers and dremel without incident. -Corn(s) R 5th toe pared utilizing sterile scalpel blade without complication or incident. Total number debrided=1. -Patient/POA to call should there be question/concern in the interim.  Return in about 3 months (around 02/03/2022).  Marzetta Board, DPM

## 2021-11-16 ENCOUNTER — Other Ambulatory Visit (HOSPITAL_COMMUNITY): Payer: Self-pay

## 2021-11-17 ENCOUNTER — Encounter: Payer: Self-pay | Admitting: Internal Medicine

## 2021-11-17 ENCOUNTER — Ambulatory Visit (INDEPENDENT_AMBULATORY_CARE_PROVIDER_SITE_OTHER): Payer: Medicare Other | Admitting: Internal Medicine

## 2021-11-17 ENCOUNTER — Other Ambulatory Visit: Payer: Self-pay

## 2021-11-17 VITALS — BP 123/84 | HR 85 | Temp 98.6°F | Resp 28 | Ht 61.5 in | Wt 136.4 lb

## 2021-11-17 DIAGNOSIS — E785 Hyperlipidemia, unspecified: Secondary | ICD-10-CM | POA: Diagnosis not present

## 2021-11-17 DIAGNOSIS — E1142 Type 2 diabetes mellitus with diabetic polyneuropathy: Secondary | ICD-10-CM | POA: Diagnosis not present

## 2021-11-17 DIAGNOSIS — Z23 Encounter for immunization: Secondary | ICD-10-CM

## 2021-11-17 DIAGNOSIS — K529 Noninfective gastroenteritis and colitis, unspecified: Secondary | ICD-10-CM | POA: Diagnosis not present

## 2021-11-17 DIAGNOSIS — Z794 Long term (current) use of insulin: Secondary | ICD-10-CM | POA: Diagnosis not present

## 2021-11-17 DIAGNOSIS — I10 Essential (primary) hypertension: Secondary | ICD-10-CM

## 2021-11-17 LAB — POCT GLYCOSYLATED HEMOGLOBIN (HGB A1C): Hemoglobin A1C: 7.4 % — AB (ref 4.0–5.6)

## 2021-11-17 LAB — GLUCOSE, CAPILLARY: Glucose-Capillary: 132 mg/dL — ABNORMAL HIGH (ref 70–99)

## 2021-11-17 NOTE — Assessment & Plan Note (Addendum)
Patient presents for follow-up on hypertension.  Blood pressure control improved from last time.  Patient states that she has been taking chlorthalidone 25 mg, and has not been taking amlodipine 5 mg.  At office visit 4 weeks ago, chlorthalidone was added in addition to amlodipine due to uncontrolled blood pressure.  Today her blood pressure is well controlled at 123/84.  She states that this is around the reading she has been seeing at home when she checks her blood pressure in the morning. BP Readings from Last 3 Encounters:  11/17/21 123/84  10/27/21 (!) 166/77  09/02/21 (!) 151/71   Plan: Blood pressure is at goal of less than 130/80 and patient is asymptomatic without dizziness.  I instructed her to continue taking just chlorthalidone 25 mg and that we would do televisit with her next week to review home blood pressure readings while taking only chlorthalidone. -Continue Chlorthalidone 25 mg -Patient discontinued amlodipine 5 mg, follow-up with televisit next week to ensure that her blood pressure is well controlled taking just chlorthalidone. -BMP today -Follow-up in 1 week via televisit

## 2021-11-17 NOTE — Patient Instructions (Signed)
Amanda [name], it was a pleasure seeing you today!  Today we discussed: Blood pressure Your blood pressure looked great today! It was 123/84, our goal is <130/80.  Please record blood pressure once daily in the morning and follow-up with a televisit next week to review this.  Please just take chlorthalidone for your blood pressure.  Diabetes We are rechecking a1c today.  Please continue taking Jardiance and trulicity as you have been. STOP taking metformin, I think this is causing diarrhea and would like to see if diarrhea gets better without medication.  Cholesterol Checking cholesterol today, I will call with results.  I have ordered the following labs today:  Lab Orders         Lipid Profile         BMP8+Anion Gap         POC Hbg A1C       I will call if any are abnormal. All of your labs can be accessed through "My Chart"   My Chart Access: [email or web address]  Referrals ordered today:   Referral Orders  No referral(s) requested today     I have ordered the following medication/changed the following medications:   Stop the following medications: Medications Discontinued During This Encounter  Medication Reason   dapagliflozin propanediol (FARXIGA) 5 MG TABS tablet Change in therapy   losartan (COZAAR) 25 MG tablet Change in therapy   amLODipine (NORVASC) 5 MG tablet Change in therapy   metFORMIN (GLUCOPHAGE) 500 MG tablet Discontinued by provider     Start the following medications: No orders of the defined types were placed in this encounter.    Follow-up:  1 week vis telehealth    Please make sure to arrive 15 minutes prior to your next appointment. If you arrive late, you may be asked to reschedule.   We look forward to seeing you next time. Please call our clinic at [phone] if you have any questions or concerns. The best time to call is Monday-Friday from 9am-4pm, but there is someone available 24/7. If after  hours or the weekend, call the main hospital number and ask for the Internal Medicine Resident On-Call. If you need medication refills, please notify your pharmacy one week in advance and they will send [place] a request.  Thank you for letting us take part in your care. Wishing you the best!  Thank you, Dr. [facility]

## 2021-11-17 NOTE — Progress Notes (Signed)
Subjective:  CC: follow-up on hypertension, diabetes, hyperlipidemia and diarrhea  HPI:  [name] is a 70 y.o. female with a past medical history stated below and presents today for follow-up on hypertension, diabetes, hyperlipidemia and diarrhea. Please see problem based assessment and plan for additional details.  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   Cataract    PRESENT IN LEFT/ CATARACTS REMOVED FROM RIGHT   Cervical radiculopathy due to degenerative joint disease of spine 05/03/2017   CKD (chronic kidney disease) stage 3, GFR 30-59 ml/min (HCC) 01/01/2021   Diabetes mellitus 1996   DKA (diabetic ketoacidoses) 01/02/2013   Gait abnormality 01/10/2019   Glaucoma    Hyperlipidemia    Hypertension    Hypotension    Influenza A (H1N1) 01/03/2013   Nail fungus 10/23/2017   Neuromuscular disorder ([place])    NEUROPATHY   Normocytic anemia 02/14/2013   Osteopenia 05/04/2016   SYNCOPE 05/10/2010   Qualifier: Diagnosis of  By: Marilynne Halsted, RN, BSN, Jacquelyn      Current Outpatient Medications on File Prior to Visit  Medication Sig Dispense Refill   empagliflozin (JARDIANCE) 10 MG TABS tablet Take 10 mg by mouth daily.     Accu-Chek Softclix Lancets lancets USE AS DIRECTED UP TO 3 TIMES DAILY (Patient taking differently: USE AS DIRECTED UP TO 3 TIMES DAILY) 300 each 3   acetaminophen (TYLENOL) 325 MG tablet Take 2 tablets (650 mg total) by mouth every 6 (six) hours as needed (or Fever >/= 101). 60 tablet 0   aspirin EC 81 MG tablet Take 1 tablet (81 mg total) by mouth daily. Swallow whole. 150 tablet 2   atorvastatin (LIPITOR) 40 MG tablet Take 1 tablet (40 mg total) by mouth daily. 90 tablet 1   blood glucose meter kit and supplies KIT Dispense based on patient and insurance preference. Use up to four times daily as directed. . 1 each 0   cetirizine (ZYRTEC) 5 MG tablet Take 1 tablet (5  mg total) by mouth daily. 30 tablet 1   chlorthalidone (HYGROTON) 25 MG tablet Take 1 tablet (25 mg total) by mouth daily. 30 tablet 0   Cholecalciferol (VITAMIN D3) 2000 units capsule Take by mouth daily.      diclofenac Sodium (VOLTAREN) 1 % GEL Apply topically.     Dulaglutide (TRULICITY) 3 SE/8.3TD SOPN Inject 3 mg as directed once a week. 2 mL 0   Ferrous Sulfate (IRON) 325 (65 Fe) MG TABS Take 1 tablet (325 mg total) by mouth daily. 30 tablet 0   fluticasone (FLONASE) 50 MCG/ACT nasal spray PLACE 2 SPRAYS INTO BOTH NOSTRILS DAILY. 16 g 2   glucose blood test strip CHECK BLOOD SUGAR UP TO 3 TIMES PER DAY (Patient taking differently: CHECK BLOOD SUGAR UP TO 3 TIMES PER DAY) 300 strip 3   Insulin Pen Needle 32G X 4 MM MISC Use to inject insulin daily 100 each 3   Insulin Pen Needle 32G X 4 MM MISC USE AS DIRECTED DAILY 100 each 0   latanoprost (XALATAN) 0.005 % ophthalmic solution 1 drop at bedtime.     Multiple Vitamins-Minerals (WOMENS 50+ MULTI VITAMIN/MIN) TABS Take 1 tablet by mouth daily. 30 tablet    [DISCONTINUED] glipiZIDE (GLUCOTROL) 5 MG tablet Take 0.5 tablets (2.5 mg total) by mouth daily before breakfast. 15 tablet 0   No current facility-administered medications  on file prior to visit.    Family History  Problem Relation Age of Onset   Diabetes Mother    Hypertension Mother    Breast cancer Mother    Cancer Mother    Diabetes Sister    Hypertension Sister    Kidney disease Sister        One Kidney transplant, one sister on HD   Breast cancer Sister    Cancer Sister    Hypertension Sister    Diabetes Sister    Cerebral palsy Brother    Cancer Brother        Unknown   Hypertension Daughter    Colon cancer Neg Hx    Esophageal cancer Neg Hx    Stomach cancer Neg Hx    Rectal cancer Neg Hx     Social History   Socioeconomic History   Marital status: Married    Spouse name: Not on file   Number of children: 1   Years of education: Not on file   Highest  education level: Not on file  Occupational History   Occupation: Retired    Comment: Printmaker  Tobacco Use   Smoking status: Never   Smokeless tobacco: Never  Vaping Use   Vaping Use: Never used  Substance and Sexual Activity   Alcohol use: No    Alcohol/week: 0.0 standard drinks   Drug use: No   Sexual activity: Not Currently    Birth control/protection: Surgical  Other Topics Concern   Not on file  Social History Narrative   Current Social History 04/23/2021        Patient lives with husband in a one level home with ramp      Patient's method of transportation is personal car shared with her mother.      The highest level of education was high school diploma.      The patient currently retired from [employer].   Social Determinants of Health   Financial Resource Strain: Not on file  Food Insecurity: Not on file  Transportation Needs: Not on file  Physical Activity: Not on file  Stress: Not on file  Social Connections: Not on file  Intimate Partner Violence: Not on file    Review of Systems: ROS negative except for what is noted on the assessment and plan.  Objective:   Vitals:   11/17/21 0953  BP: 123/84  Pulse: 85  Resp: (!) 28  Temp: 98.6 F (37 C)  TempSrc: Oral  SpO2: 100%  Weight: 136 lb 6.4 oz (61.9 kg)  Height: 5' 1.5" (1.562 m)    Physical Exam: Gen: A&O x3 and in no apparent distress, well appearing and nourished. HEENT:    Head - normocephalic, atraumatic.    Eye - visual acuity grossly intact, conjunctiva clear, sclera non-icteric, EOM intact.    Mouth - No obvious caries or periodontal disease. Neck: no masses or nodules, AROM intact. CV: RRR, no murmurs, S1/S2 presents  Resp: Clear to auscultation bilaterally  Abd: BS (+) x4, soft, non-tender abdomen, without hepatosplenomegaly or masses MSK: Grossly normal AROM and strength x4 extremities. No skin changes over back of upper extremity bilaterally Skin: good skin turgor, no rashes,  unusual bruising, or prominent lesions.  Neuro: No focal deficits, grossly normal sensation and coordination.  Psych: Oriented x3 and responding appropriately. Intact memory, normal mood, judgement, affect, and insight.    Assessment & Plan:  See Encounters Tab for problem based charting.  Patient seen with Dr. Jimmye Norman  Christiana Fuchs, D.O. [facility]  PGY-1 Pager: [phone]  Phone: [phone] Date 11/17/2021  Time 1:24 PM

## 2021-11-17 NOTE — Assessment & Plan Note (Signed)
Patient has not been able to tolerate ACE or ARB in past.  She started taking Jardiance 10 mg earlier this week and has tolerated it well.  She states that she has her initial appointment with nephrology tomorrow.  Plan: -continue jardiance 10 mg

## 2021-11-17 NOTE — Assessment & Plan Note (Addendum)
A1c is increased from 6.9 to 7.4 since September.  She states that she checks her blood sugar every morning and it is usually around 140.    Medications: Metformin 500 mg twice daily, Jardiance 10 mg daily, Trulicity 3 mg weekly  She continues to have diarrhea, but frequency had decreased to two times daily.  She states that this affects her quality of life and she wears pull ups because of diarrhea. She has been on metformin for a long duration of time and diarrhea symptoms improved with decrease in metformin a few months ago. It is unclear if this is due to the metformin or due to treating H. pylori.  Asked patient to hold metformin at this time to see if this improves diarrhea.  Patient states that she also has itchiness around the injection site after her weekly Trulicity shot weekly.  She states she is able to tolerate itchiness by putting Vaseline over the site.  She has not noticed any redness or changes to the skin where she is injecting.  She switches between the backs of both upper arms.  Physical exam reveals no erythema or skin changes to back of upper arms bilaterally.  Plan: -Discontinue metformin 500 mg twice daily -Continue Jardiance 10 mg daily -Continue Trulicity 3 mg weekly -N3I increased from 6.9 to 7.4

## 2021-11-17 NOTE — Assessment & Plan Note (Addendum)
Patient states that she continues to have diarrhea twice daily.  She wears a pull-up every day due to concern she will not make it to the bathroom.  She has had chronic diarrhea for several years and has previously been evaluated by GI. She was treated for H. pylori over the summer, H. pylori test negative in September 2022.  Has also been on metformin for several years.  Assessment The patient decreased metformin dose (due to chronic kidney disease) around the same time as treatment for H. pylori.  She states that her diarrhea has improved since that time, it is unclear if it was due to treatment of H. pylori versus medication side effect.  Last TSH in 2021 wnl and CRP wnl. Labs in 2021 showed elevated TTG IgG antibody and an EGD was recommended to rule out celiac disease, however she did not wish to pursue that at that time.  A SIBO breath htest ordered but was not completed. Colonoscopy completed in 12/2018 which showed one polyp and colon biopsies negative for microscopic colitis or IBD.  Plan: -Discontinue metformin, if diarrhea continues consider further evaluation for celiac disease -Follow-up in 1 week via televisit

## 2021-11-17 NOTE — Assessment & Plan Note (Signed)
She states that she has not received shingles vaccine yet.

## 2021-11-17 NOTE — Assessment & Plan Note (Addendum)
Patient reports taking atorvastatin 40 mg daily and has not noticed side effects.  She recently was diagnosed with PAD via ABI with Right leg 0.82 and left leg 1.13.  Will recheck lipid panel for secondary prevention with LDL goal of <70.  -lipid panel -continue atorvastatin 40 mg -continue aspirin 81 mg

## 2021-11-17 NOTE — Assessment & Plan Note (Signed)
>>  ASSESSMENT AND PLAN FOR CKD (CHRONIC KIDNEY DISEASE), STAGE IV (HCC) WRITTEN ON 11/17/2021  4:32 PM BY [name], KATIE, DO  Patient has not been able to tolerate ACE or ARB in past.  She started taking Jardiance 10 mg earlier this week and has tolerated it well.  She states that she has her initial appointment with nephrology tomorrow.  Plan: -continue jardiance 10 mg

## 2021-11-18 ENCOUNTER — Telehealth: Payer: Self-pay | Admitting: Internal Medicine

## 2021-11-18 LAB — BMP8+ANION GAP
Anion Gap: 15 mmol/L (ref 10.0–18.0)
BUN/Creatinine Ratio: 26 (ref 12–28)
BUN: 42 mg/dL — ABNORMAL HIGH (ref 8–27)
CO2: 21 mmol/L (ref 20–29)
Calcium: 9.2 mg/dL (ref 8.7–10.3)
Chloride: 107 mmol/L — ABNORMAL HIGH (ref 96–106)
Creatinine, Ser: 1.64 mg/dL — ABNORMAL HIGH (ref 0.57–1.00)
Glucose: 130 mg/dL — ABNORMAL HIGH (ref 70–99)
Potassium: 4.5 mmol/L (ref 3.5–5.2)
Sodium: 143 mmol/L (ref 134–144)
eGFR: 34 mL/min/{1.73_m2} — ABNORMAL LOW (ref >59)

## 2021-11-18 LAB — LIPID PANEL
Chol/HDL Ratio: 2.1 ratio (ref 0.0–4.4)
Cholesterol, Total: 149 mg/dL (ref 100–199)
HDL: 71 mg/dL (ref >39)
LDL Chol Calc (NIH): 68 mg/dL (ref 0–99)
Triglycerides: 46 mg/dL (ref 0–149)
VLDL Cholesterol Cal: 10 mg/dL (ref 5–40)

## 2021-11-18 NOTE — Telephone Encounter (Signed)
Unable to reach patient at this time to review labs from earlier this week.  Left voicemail to return call to clinic and I will call again tomorrow.

## 2021-11-22 ENCOUNTER — Other Ambulatory Visit: Payer: Self-pay | Admitting: Internal Medicine

## 2021-11-22 DIAGNOSIS — N1832 Chronic kidney disease, stage 3b: Secondary | ICD-10-CM

## 2021-11-22 DIAGNOSIS — I129 Hypertensive chronic kidney disease with stage 1 through stage 4 chronic kidney disease, or unspecified chronic kidney disease: Secondary | ICD-10-CM

## 2021-11-25 ENCOUNTER — Other Ambulatory Visit: Payer: Self-pay

## 2021-11-25 ENCOUNTER — Other Ambulatory Visit: Payer: Self-pay | Admitting: Internal Medicine

## 2021-11-25 ENCOUNTER — Ambulatory Visit
Admission: RE | Admit: 2021-11-25 | Discharge: 2021-11-25 | Disposition: A | Discharge status: Home / Self Care | Payer: Medicare Other | Source: Ambulatory Visit | Attending: Internal Medicine | Admitting: Internal Medicine

## 2021-11-25 DIAGNOSIS — N6313 Unspecified lump in the right breast, lower outer quadrant: Secondary | ICD-10-CM

## 2021-11-30 NOTE — Progress Notes (Signed)
Internal Medicine Clinic Attending  Case discussed with Dr. Howie Ill  At the time of the visit.  We reviewed the residents history and exam and pertinent patient test results.  I agree with the assessment, diagnosis, and plan of care documented in the residents note.  Metformin intolerance is most likely cause of her chronic diarrhea which improved with dose reduction.  Agree with trial of cessation to determine impact on diarrhea.

## 2021-12-01 ENCOUNTER — Encounter: Payer: Medicare Other | Admitting: Internal Medicine

## 2021-12-02 ENCOUNTER — Other Ambulatory Visit (HOSPITAL_COMMUNITY): Payer: Self-pay

## 2021-12-02 ENCOUNTER — Ambulatory Visit (INDEPENDENT_AMBULATORY_CARE_PROVIDER_SITE_OTHER): Payer: Medicare Other | Admitting: Internal Medicine

## 2021-12-02 ENCOUNTER — Encounter: Payer: Self-pay | Admitting: Internal Medicine

## 2021-12-02 VITALS — BP 118/70 | HR 84 | Wt 135.8 lb

## 2021-12-02 DIAGNOSIS — Z794 Long term (current) use of insulin: Secondary | ICD-10-CM | POA: Diagnosis not present

## 2021-12-02 DIAGNOSIS — D509 Iron deficiency anemia, unspecified: Secondary | ICD-10-CM | POA: Insufficient documentation

## 2021-12-02 DIAGNOSIS — E1142 Type 2 diabetes mellitus with diabetic polyneuropathy: Secondary | ICD-10-CM

## 2021-12-02 DIAGNOSIS — J302 Other seasonal allergic rhinitis: Secondary | ICD-10-CM | POA: Diagnosis not present

## 2021-12-02 MED ORDER — FLUTICASONE PROPIONATE 50 MCG/ACT NA SUSP
1.0000 | Freq: Every day | NASAL | 2 refills | Status: DC
Start: 2021-12-02 — End: 2022-02-03
  Filled 2021-12-02: qty 16, 60d supply, fill #0
  Filled 2021-12-27: qty 16, 60d supply, fill #1

## 2021-12-02 MED ORDER — METFORMIN HCL ER 500 MG PO TB24
500.0000 mg | ORAL_TABLET | Freq: Every day | ORAL | 1 refills | Status: DC
  Filled 2021-12-02: qty 30, 30d supply, fill #0

## 2021-12-02 NOTE — Progress Notes (Signed)
° °  CC: Type 2 Diabetes Mellitus, seasonal allergies, Iron Deficiency Anemia  HPI:Amanda [name] is a 70 y.o. female who presents for evaluation of type 2 diabetes mellitus, seasonal allergies, and iron deficiency anemia. Please see individual problem based A/P for details.  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   Cataract    PRESENT IN LEFT/ CATARACTS REMOVED FROM RIGHT   Cervical radiculopathy due to degenerative joint disease of spine 05/03/2017   CKD (chronic kidney disease) stage 3, GFR 30-59 ml/min (HCC) 01/01/2021   Diabetes mellitus 1996   DKA (diabetic ketoacidoses) 01/02/2013   Gait abnormality 01/10/2019   Glaucoma    Hyperlipidemia    Hypertension    Hypotension    Influenza A (H1N1) 01/03/2013   Nail fungus 10/23/2017   Neuromuscular disorder ([place])    NEUROPATHY   Normocytic anemia 02/14/2013   Osteopenia 05/04/2016   SYNCOPE 05/10/2010   Qualifier: Diagnosis of  By: Marilynne Halsted RN, BSN, Jacquelyn     Review of Systems:   Review of Systems  Constitutional:  Negative for chills and fever.  HENT:  Positive for congestion. Negative for sore throat.   Gastrointestinal:  Positive for constipation. Negative for diarrhea.    Physical Exam: Vitals:   12/02/21 0939  BP: 118/70  Pulse: 84  SpO2: 100%   General: Well-appearing, appears younger than stated age,  normal weight HEENT: No tonsillar exudate, tender to palpation in maxillary sinus, normal tympanic membrane with good cone of light Cardiovascular: Normal rate, regular rhythm.  No murmurs, rubs, or gallops Pulmonary : Equal breath sounds, No wheezes, rales, or rhonchi   Assessment & Plan:   See Encounters Tab for problem based charting.  Patient discussed with Dr.  Saverio Danker

## 2021-12-02 NOTE — Assessment & Plan Note (Signed)
Patient has her medications for review today and is taking iron supplementation.  She reports being on iron for some time for anemia.  Ferritin 38 in March.  Last colonoscopy in 2020 and next one is due in 2030.  Assessment/Plan: IDA, No blood loss and up to date on colon cancer screening. Will check labs today.  - Ferritin - CBC With Differential

## 2021-12-02 NOTE — Assessment & Plan Note (Signed)
Patient here for follow-up after having metformin discontinued for diarrhea.  She reports her diarrhea has resolved and she has had constipation.  She is continued Jardiance 10 mg daily and Trulicity 3 mg weekly.  Her last A1c was 7.4.  She has blood glucoses written down for review.  Her blood glucose has been roughly, 180-200 these past 2 weeks off of Metformin.  We discussed option of trying metformin 500 mg extended release once daily or increasing Trulicity to 4.5 mg weekly.  Ultimately decided to give metformin a chance.  Patient given instructions if she is not tolerating metformin to call clinic so we can discontinue and increase Trulicity dose.  - metFORMIN (GLUCOPHAGE-XR) 500 MG 24 hr tablet; Take 1 tablet (500 mg total) by mouth daily with breakfast.  Dispense: 30 tablet; Refill: 1 - continue Jardiance 10 mg daily - continue Trulicity 3 mg weekly -Follow-up in 2 to 3 months

## 2021-12-02 NOTE — Patient Instructions (Addendum)
1. Controlled type 2 diabetes mellitus with diabetic polyneuropathy, with long-term current use of insulin (HCC)  - metFORMIN (GLUCOPHAGE-XR) 500 MG 24 hr tablet; Take 1 tablet (500 mg total) by mouth daily with breakfast.  Dispense: 30 tablet; Refill: 1  2. Seasonal allergies  - fluticasone (FLONASE) 50 MCG/ACT nasal spray; Place 1 spray into both nostrils daily.  Dispense: 15.8 mL; Refill: 2  3. Iron deficiency anemia, unspecified iron deficiency anemia type  - Ferritin - CBC With Differential

## 2021-12-02 NOTE — Assessment & Plan Note (Signed)
Patient continues to have runny nose, sneezing and congestion this time of year.  She has been managing her symptoms with nasal rinse, Zyrtec, and Flonase.  No fever.  Refilled Flonase today

## 2021-12-03 ENCOUNTER — Other Ambulatory Visit (HOSPITAL_COMMUNITY): Payer: Self-pay

## 2021-12-03 ENCOUNTER — Other Ambulatory Visit: Payer: Self-pay | Admitting: Internal Medicine

## 2021-12-03 ENCOUNTER — Ambulatory Visit
Admission: RE | Admit: 2021-12-03 | Discharge: 2021-12-03 | Disposition: A | Discharge status: Home / Self Care | Payer: Medicare Other | Source: Ambulatory Visit | Attending: Internal Medicine | Admitting: Internal Medicine

## 2021-12-03 DIAGNOSIS — N1832 Chronic kidney disease, stage 3b: Secondary | ICD-10-CM

## 2021-12-03 DIAGNOSIS — I10 Essential (primary) hypertension: Secondary | ICD-10-CM

## 2021-12-03 DIAGNOSIS — I129 Hypertensive chronic kidney disease with stage 1 through stage 4 chronic kidney disease, or unspecified chronic kidney disease: Secondary | ICD-10-CM

## 2021-12-03 LAB — CBC WITH DIFFERENTIAL
Basophils Absolute: 0 10*3/uL (ref 0.0–0.2)
Basos: 1 %
EOS (ABSOLUTE): 0.2 10*3/uL (ref 0.0–0.4)
Eos: 3 %
Hematocrit: 35.9 % (ref 34.0–46.6)
Hemoglobin: 11.6 g/dL (ref 11.1–15.9)
Immature Grans (Abs): 0 10*3/uL (ref 0.0–0.1)
Immature Granulocytes: 0 %
Lymphocytes Absolute: 1.4 10*3/uL (ref 0.7–3.1)
Lymphs: 29 %
MCH: 29.8 pg (ref 26.6–33.0)
MCHC: 32.3 g/dL (ref 31.5–35.7)
MCV: 92 fL (ref 79–97)
Monocytes Absolute: 0.3 10*3/uL (ref 0.1–0.9)
Monocytes: 7 %
Neutrophils Absolute: 3 10*3/uL (ref 1.4–7.0)
Neutrophils: 60 %
RBC: 3.89 x10E6/uL (ref 3.77–5.28)
RDW: 12.2 % (ref 11.7–15.4)
WBC: 4.9 10*3/uL (ref 3.4–10.8)

## 2021-12-03 LAB — FERRITIN: Ferritin: 75 ng/mL (ref 15–150)

## 2021-12-03 MED ORDER — CHLORTHALIDONE 25 MG PO TABS
25.0000 mg | ORAL_TABLET | Freq: Every day | ORAL | 1 refills | Status: DC
  Filled 2021-12-03: qty 90, 90d supply, fill #0
  Filled 2022-03-09: qty 90, 90d supply, fill #1

## 2021-12-03 NOTE — Progress Notes (Signed)
Internal Medicine Clinic Attending  Case discussed with Dr. Steen  At the time of the visit.  We reviewed the resident's history and exam and pertinent patient test results.  I agree with the assessment, diagnosis, and plan of care documented in the resident's note.  

## 2021-12-06 ENCOUNTER — Other Ambulatory Visit (HOSPITAL_COMMUNITY): Payer: Self-pay

## 2021-12-08 ENCOUNTER — Ambulatory Visit
Admission: RE | Admit: 2021-12-08 | Discharge: 2021-12-08 | Disposition: A | Discharge status: Home / Self Care | Payer: Medicare Other | Source: Ambulatory Visit | Attending: Internal Medicine | Admitting: Internal Medicine

## 2021-12-08 DIAGNOSIS — N6313 Unspecified lump in the right breast, lower outer quadrant: Secondary | ICD-10-CM

## 2021-12-09 ENCOUNTER — Other Ambulatory Visit: Payer: Self-pay | Admitting: Internal Medicine

## 2021-12-09 ENCOUNTER — Other Ambulatory Visit (HOSPITAL_COMMUNITY): Payer: Self-pay

## 2021-12-09 DIAGNOSIS — R928 Other abnormal and inconclusive findings on diagnostic imaging of breast: Secondary | ICD-10-CM

## 2021-12-23 ENCOUNTER — Other Ambulatory Visit: Payer: Self-pay

## 2021-12-23 ENCOUNTER — Ambulatory Visit (INDEPENDENT_AMBULATORY_CARE_PROVIDER_SITE_OTHER): Payer: Medicare Other | Admitting: Internal Medicine

## 2021-12-23 ENCOUNTER — Encounter: Payer: Self-pay | Admitting: Internal Medicine

## 2021-12-23 DIAGNOSIS — J069 Acute upper respiratory infection, unspecified: Secondary | ICD-10-CM | POA: Insufficient documentation

## 2021-12-23 HISTORY — DX: Acute upper respiratory infection, unspecified: J06.9

## 2021-12-23 NOTE — Progress Notes (Signed)
° °  CC: sinus congestion  This is a telephone encounter between Amanda [name] and Amanda [name] on 12/23/2021 for sinus congestion. The visit was conducted with the patient located at home and Amanda [name] at [facility]. The patient's identity was confirmed using their DOB and current address. The patient has consented to being evaluated through a telephone encounter and understands the associated risks (an examination cannot be done and the patient may need to come in for an appointment) / benefits (allows the patient to remain at home, decreasing exposure to coronavirus). I personally spent 10 minutes on medical discussion.   HPI:  [name] is a 71 y.o. with PMH as below.  Patient reports 3-day history of sinus symptoms.  Her symptoms began with nasal congestion, runny nose postnasal drip and then later sore throat, headache, dry cough, fatigue with mild sinus tenderness.  Patient denies fever, shortness of breath, myalgias.  She reports the symptoms have been stable and she has been using Tylenol and Flonase as needed.  We discussed adding other oral nasal decongestants, preferably Chlorpheniramine given her history of hypertension, or Afrin or saline rinses.  Discussed Mucinex for chest congestion.  The patient denies fevers encourage continued use of Tylenol to treat sinus pain and sore throat pain.  Encourage patient to get COVID tested given viral upper respiratory symptoms.  Lastly, patient was advised to call back our office or present to urgent care if after 7 days her symptoms have not improved or are worsening with fever, worsening cough, worsening sinus pain, shortness of breath, worsening cough, which may be concerning for secondary bacterial infection.  Patient reports has [facility] appointment on Monday, will update [place] how she is doing at her appointment.  Please see A&P for assessment of the patient's acute and chronic medical conditions.   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   Cataract    PRESENT IN LEFT/ CATARACTS REMOVED FROM RIGHT   Cervical radiculopathy due to degenerative joint disease of spine 05/03/2017   CKD (chronic kidney disease) stage 3, GFR 30-59 ml/min (HCC) 01/01/2021   Diabetes mellitus 1996   DKA (diabetic ketoacidoses) 01/02/2013   Gait abnormality 01/10/2019   Glaucoma    Hyperlipidemia    Hypertension    Hypotension    Influenza A (H1N1) 01/03/2013   Nail fungus 10/23/2017   Neuromuscular disorder ([place])    NEUROPATHY   Normocytic anemia 02/14/2013   Osteopenia 05/04/2016   SYNCOPE 05/10/2010   Qualifier: Diagnosis of  By: Marilynne Halsted, RN, BSN, Jacquelyn     Review of Systems:    (+) Nasal congestion, postnasal drip, sore throat, headache, sinus tenderness, dry cough, fatigue  (-) Fever, shortness of breath, myalgias   Assessment & Plan:   See Encounters Tab for problem based charting.  Patient discussed with Dr.  Solon Augusta, MD 12/23/21, 9:32 AM Pager: [phone] Internal Medicine Resident, PGY-1 [facility]

## 2021-12-23 NOTE — Assessment & Plan Note (Addendum)
Patient reports 3-day history of sinus symptoms.  Her symptoms began with nasal congestion, runny nose postnasal drip and then later sore throat, headache, dry cough, fatigue with mild sinus tenderness.  Patient denies fever, shortness of breath, myalgias.  She reports the symptoms have been stable and she has been using Tylenol and Flonase as needed.  We discussed adding other oral nasal decongestants, preferably Chlorpheniramine given her history of hypertension, or Afrin or saline rinses.  Discussed Mucinex for chest congestion.  The patient denies fevers though encouraged continued use of Tylenol to treat sinus pain and sore throat pain.  Encourage patient to get COVID tested given viral upper respiratory symptoms.  Lastly, patient was advised to call back our office or present to urgent care if after 7 days her symptoms have not improved or are worsening with fever, worsening cough, worsening sinus pain, shortness of breath, which may be concerning for secondary bacterial infection.  Patient reports has [facility] appointment on Monday, will update [place] how she is doing at this appointment.

## 2021-12-24 NOTE — Progress Notes (Signed)
Internal Medicine Clinic Attending ? ?Case discussed with Dr. Zinoviev  At the time of the visit.  We reviewed the resident?s history and exam and pertinent patient test results.  I agree with the assessment, diagnosis, and plan of care documented in the resident?s note.  ?

## 2021-12-27 ENCOUNTER — Ambulatory Visit (INDEPENDENT_AMBULATORY_CARE_PROVIDER_SITE_OTHER): Payer: Medicare Other | Admitting: Internal Medicine

## 2021-12-27 ENCOUNTER — Other Ambulatory Visit (HOSPITAL_COMMUNITY): Payer: Self-pay

## 2021-12-27 ENCOUNTER — Encounter: Payer: Self-pay | Admitting: Internal Medicine

## 2021-12-27 VITALS — BP 120/77 | HR 81 | Temp 98.2°F | Wt 136.8 lb

## 2021-12-27 DIAGNOSIS — E1142 Type 2 diabetes mellitus with diabetic polyneuropathy: Secondary | ICD-10-CM | POA: Diagnosis not present

## 2021-12-27 DIAGNOSIS — D509 Iron deficiency anemia, unspecified: Secondary | ICD-10-CM | POA: Diagnosis not present

## 2021-12-27 DIAGNOSIS — E1122 Type 2 diabetes mellitus with diabetic chronic kidney disease: Secondary | ICD-10-CM

## 2021-12-27 DIAGNOSIS — I129 Hypertensive chronic kidney disease with stage 1 through stage 4 chronic kidney disease, or unspecified chronic kidney disease: Secondary | ICD-10-CM | POA: Diagnosis not present

## 2021-12-27 DIAGNOSIS — N1832 Chronic kidney disease, stage 3b: Secondary | ICD-10-CM

## 2021-12-27 DIAGNOSIS — Z299 Encounter for prophylactic measures, unspecified: Secondary | ICD-10-CM

## 2021-12-27 DIAGNOSIS — I10 Essential (primary) hypertension: Secondary | ICD-10-CM

## 2021-12-27 DIAGNOSIS — Z794 Long term (current) use of insulin: Secondary | ICD-10-CM

## 2021-12-27 MED ORDER — METFORMIN HCL ER 500 MG PO TB24
1000.0000 mg | ORAL_TABLET | Freq: Every day | ORAL | 2 refills | Status: DC
  Filled 2021-12-27: qty 90, 45d supply, fill #0

## 2021-12-27 NOTE — Patient Instructions (Signed)
Thank you, Ms.Odell R Mcdill for allowing [place] to provide your care today. Today we discussed:  Diabetes: Today we increased your metformin to TWO 500mg  tablets twice a day.  Monitor for side effects such as nausea, loose bowel movements.  Please continue to check your blood sugars every morning.  We will do a urine protein test today.  Occasional constipation: You can take MiraLAX once or twice a day, it is a powder that you can get at any local pharmacy to help with constipation.  Iron deficiency anemia: Please restart taking your iron supplement  Vaccines: You are due for a COVID Moderna booster as well as your first shingles vaccine.  We discussed that you can get these at your local pharmacy.  I have ordered the following labs for you:   Lab Orders         Microalbumin / Creatinine Urine Ratio       My Chart Access: [email or web address]  Please follow-up in in 1 month.  Please make sure to arrive 15 minutes prior to your next appointment. If you arrive late, you may be asked to reschedule.    We look forward to seeing you next time. Please call our clinic at [phone] if you have any questions or concerns. The best time to call is Monday-Friday from 9am-4pm, but there is someone available 24/7. If after hours or the weekend, call the main hospital number and ask for the Internal Medicine Resident On-Call. If you need medication refills, please notify your pharmacy one week in advance and they will send [place] a request.   Thank you for letting us take part in your care. Wishing you the best!  Wayland Denis, MD 12/27/2021, 10:05 AM IM Resident, PGY-1

## 2021-12-27 NOTE — Assessment & Plan Note (Signed)
Patient reports not taking iron supplement, per chart review possibly because patient was endorsing constipation.  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  She is up-to-date with her colonoscopy, average risk for colon cancer, next colonoscopy 2030.  Most recent lab work 12/15 with ferritin 75 and CBC with hemoglobin 11.6 while on iron supplementation.  Plan: -Restart iron supplementation -MiraLAX for occasional constipation

## 2021-12-27 NOTE — Assessment & Plan Note (Addendum)
Patient presents for routine OV.  Initial BP on arrival 143/73.  Subsequent BP 120/77.  At home blood pressure checks with well-controlled BPs.   Patient's blood pressure currently well controlled on current regimen of chlorthalidone 25 mg daily.  Also on Jardiance 10 mg daily.  Plan: -Continue chlorthalidone 25 mg daily -Continue at home BP checks -Consider adding ACE/ARB if patient is having proteinuria for prevention of progression of CKD in patients with T2DM, see A/P for T2DM

## 2021-12-27 NOTE — Assessment & Plan Note (Addendum)
Last BMP BUN 42 and creatinine 1.64 with eGFR 34.  Patient reports she has established care with [facility].  Has her next office visit tomorrow.  Discussed bringing office visit documentation to next appointment.

## 2021-12-27 NOTE — Assessment & Plan Note (Addendum)
Patient presents for follow-up OV after restarting metformin at last OV.  Last hemoglobin A1c from 11/30 was 7.4%.  Due to diarrhea, metformin was initially discontinued, and then long-acting metformin 500 mg was restarted at last OV.  Patient continues to deny diarrhea, states she is occasionally constipated.  Discussed MiraLAX for occasional constipation, she is already taking fiber Gummies.  Current regimen includes metformin XL 500 mg daily, Jardiance 10 mg daily, Trulicity 3 once weekly.  Patient endorses adherence.  Patient presents log of CBG fasting a.m. checks.  High 210, low 110s, average 150s-180s.  Patient due for urine microalbumin today.  Denies symptoms of polydipsia, burning/numbness in feet and hands, blurry vision.  Blood sugars currently inadequately controlled on current regimen.  Will increase metformin XL to 2 500 mg tablets a day.  Patient has previously been prescribed an ACE or ARB for prevention of progression of CKD in patient with T2DM.  Most recently on losartan in October 2022, however this was discontinued due to hyperkalemia.  Patient has since been started on chlorthalidone which would offset hyperkalemia if we were to consider restarting ARB.  Patient has previously had dizziness when starting second antihypertensive. Patient was noted to have chronic cough on ACE previously.  Plan: -Increase to 2x 500 mg metformin XL daily -Continue Jardiance 10 mg daily -Continue Trulicity 3 once weekly (Wednesdays) -Continue lifestyle modifications -Continue at home CBG checks -Consider addition of ARB if patient is having proteinuria and her blood pressure can tolerate the addition -Urine microalbumin ordered and collected today  ADDENDUM: Patient's microalbumin/creatinine ratio 77 (high)

## 2021-12-27 NOTE — Assessment & Plan Note (Signed)
Discussed shingles vaccine and COVID booster.  Patient is interested in both.  Instructed patient to obtain these at her local pharmacy.

## 2021-12-27 NOTE — Assessment & Plan Note (Signed)
>>  ASSESSMENT AND PLAN FOR CKD (CHRONIC KIDNEY DISEASE), STAGE IV (HCC) WRITTEN ON 12/27/2021  1:26 PM BY ZINOVIEV, EVA, MD  Last BMP BUN 42 and creatinine 1.64 with eGFR 34.  Patient reports she has established care with [facility].  Has her next office visit tomorrow.  Discussed bringing office visit documentation to next appointment.

## 2021-12-27 NOTE — Progress Notes (Signed)
CC: routine visit  HPI:  [name] is a 71 y.o. female with a past medical history stated below and presents today for follow up for diabetes following restarting metformin. Please see problem based assessment and plan for additional details.  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   Cataract    PRESENT IN LEFT/ CATARACTS REMOVED FROM RIGHT   Cervical radiculopathy due to degenerative joint disease of spine 05/03/2017   CKD (chronic kidney disease) stage 3, GFR 30-59 ml/min (HCC) 01/01/2021   Diabetes mellitus 1996   DKA (diabetic ketoacidoses) 01/02/2013   Gait abnormality 01/10/2019   Glaucoma    Hyperlipidemia    Hypertension    Hypotension    Influenza A (H1N1) 01/03/2013   Nail fungus 10/23/2017   Neuromuscular disorder ([place])    NEUROPATHY   Normocytic anemia 02/14/2013   Osteopenia 05/04/2016   SYNCOPE 05/10/2010   Qualifier: Diagnosis of  By: Marilynne Halsted RN, BSN, Jacquelyn      Current Outpatient Medications on File Prior to Visit  Medication Sig Dispense Refill   Accu-Chek Softclix Lancets lancets USE AS DIRECTED UP TO 3 TIMES DAILY (Patient taking differently: USE AS DIRECTED UP TO 3 TIMES DAILY) 300 each 3   acetaminophen (TYLENOL) 325 MG tablet Take 2 tablets (650 mg total) by mouth every 6 (six) hours as needed (or Fever >/= 101). 60 tablet 0   aspirin EC 81 MG tablet Take 1 tablet (81 mg total) by mouth daily. Swallow whole. 150 tablet 2   atorvastatin (LIPITOR) 40 MG tablet Take 1 tablet (40 mg total) by mouth daily. 90 tablet 1   blood glucose meter kit and supplies KIT Dispense based on patient and insurance preference. Use up to four times daily as directed. . 1 each 0   cetirizine (ZYRTEC) 5 MG tablet Take 1 tablet (5 mg total) by mouth daily. 30 tablet 1   chlorthalidone (HYGROTON) 25 MG tablet Take 1 tablet (25 mg total) by mouth daily. 90 tablet 1   Cholecalciferol  (VITAMIN D3) 2000 units capsule Take by mouth daily.      diclofenac Sodium (VOLTAREN) 1 % GEL Apply topically.     Dulaglutide (TRULICITY) 3 TA/5.6PV SOPN Inject 3 mg as directed once a week. 2 mL 0   empagliflozin (JARDIANCE) 10 MG TABS tablet Take 10 mg by mouth daily.     Ferrous Sulfate (IRON) 325 (65 Fe) MG TABS Take 1 tablet (325 mg total) by mouth daily. 30 tablet 0   fluticasone (FLONASE) 50 MCG/ACT nasal spray Place 1 spray into both nostrils daily. 16 g 2   glucose blood test strip CHECK BLOOD SUGAR UP TO 3 TIMES PER DAY (Patient taking differently: CHECK BLOOD SUGAR UP TO 3 TIMES PER DAY) 300 strip 3   Insulin Pen Needle 32G X 4 MM MISC Use to inject insulin daily 100 each 3   Insulin Pen Needle 32G X 4 MM MISC USE AS DIRECTED DAILY 100 each 0   latanoprost (XALATAN) 0.005 % ophthalmic solution 1 drop at bedtime.     metFORMIN (GLUCOPHAGE-XR) 500 MG 24 hr tablet Take 1 tablet (500 mg total) by mouth daily with breakfast. 30 tablet 1   Multiple Vitamins-Minerals (WOMENS 50+ MULTI VITAMIN/MIN) TABS Take 1 tablet by mouth daily. 30 tablet    [DISCONTINUED] glipiZIDE (GLUCOTROL) 5 MG tablet Take 0.5 tablets (2.5 mg  total) by mouth daily before breakfast. 15 tablet 0   No current facility-administered medications on file prior to visit.    Family History  Problem Relation Age of Onset   Diabetes Mother    Hypertension Mother    Breast cancer Mother    Cancer Mother    Diabetes Sister    Hypertension Sister    Kidney disease Sister        One Kidney transplant, one sister on HD   Breast cancer Sister    Cancer Sister    Hypertension Sister    Diabetes Sister    Cerebral palsy Brother    Cancer Brother        Unknown   Hypertension Daughter    Colon cancer Neg Hx    Esophageal cancer Neg Hx    Stomach cancer Neg Hx    Rectal cancer Neg Hx     Social History:  Social History   Tobacco Use   Smoking status: Never   Smokeless tobacco: Never  Vaping Use   Vaping Use:  Never used  Substance Use Topics   Alcohol use: No    Alcohol/week: 0.0 standard drinks   Drug use: No    Review of Systems: ROS negative except for what is noted on the assessment and plan.  Vitals:   12/27/21 0925  BP: (!) 143/73  Pulse: 86  Temp: 98.2 F (36.8 C)  TempSrc: Oral  SpO2: 100%  Weight: 136 lb 12.8 oz (62.1 kg)     Physical Exam: General: Well appearing African-American female, NAD HENT: normocephalic, atraumatic, MMM EYES: conjunctiva non-erythematous, no scleral icterus CV: regular rate, normal rhythm, no murmurs, rubs, gallops.  No lower extremity edema. Pulmonary: normal work of breathing on RA, lungs clear to auscultation, no rales, wheezes, rhonchi Abdominal: non-distended, soft, non-tender to palpation, normal BS Skin: Warm and dry, no rashes or lesions Neurological: MS: awake, alert and oriented x3, normal speech and fund of knowledge Motor: moves all extremities antigravity Psych: normal affect    Assessment & Plan:   See Encounters Tab for problem based charting.  Patient discussed with Dr. Illene Regulus, M.D. [facility], PGY-1 Pager: [phone] Date 12/27/2021 Time 9:47 AM

## 2021-12-28 LAB — MICROALBUMIN / CREATININE URINE RATIO
Creatinine, Urine: 101.4 mg/dL
Microalb/Creat Ratio: 77 mg/g creat — ABNORMAL HIGH (ref 0–29)
Microalbumin, Urine: 78 ug/mL

## 2021-12-28 NOTE — Progress Notes (Signed)
Internal Medicine Clinic Attending ? ?Case discussed with Dr. Zinoviev  At the time of the visit.  We reviewed the resident?s history and exam and pertinent patient test results.  I agree with the assessment, diagnosis, and plan of care documented in the resident?s note.  ?

## 2021-12-29 ENCOUNTER — Other Ambulatory Visit (HOSPITAL_COMMUNITY): Payer: Self-pay

## 2022-01-03 ENCOUNTER — Ambulatory Visit: Payer: Self-pay | Admitting: Surgery

## 2022-01-03 DIAGNOSIS — N6311 Unspecified lump in the right breast, upper outer quadrant: Secondary | ICD-10-CM

## 2022-01-06 ENCOUNTER — Other Ambulatory Visit (HOSPITAL_COMMUNITY): Payer: Self-pay

## 2022-01-07 ENCOUNTER — Other Ambulatory Visit: Payer: Self-pay | Admitting: Surgery

## 2022-01-07 DIAGNOSIS — N6311 Unspecified lump in the right breast, upper outer quadrant: Secondary | ICD-10-CM

## 2022-01-27 ENCOUNTER — Encounter: Payer: Self-pay | Admitting: Student

## 2022-01-27 ENCOUNTER — Other Ambulatory Visit: Payer: Self-pay | Admitting: Student

## 2022-01-27 ENCOUNTER — Other Ambulatory Visit (HOSPITAL_COMMUNITY): Payer: Self-pay

## 2022-01-27 ENCOUNTER — Other Ambulatory Visit: Payer: Self-pay

## 2022-01-27 ENCOUNTER — Ambulatory Visit (INDEPENDENT_AMBULATORY_CARE_PROVIDER_SITE_OTHER): Payer: Medicare Other | Admitting: Student

## 2022-01-27 DIAGNOSIS — E1142 Type 2 diabetes mellitus with diabetic polyneuropathy: Secondary | ICD-10-CM | POA: Diagnosis not present

## 2022-01-27 DIAGNOSIS — Z794 Long term (current) use of insulin: Secondary | ICD-10-CM

## 2022-01-27 DIAGNOSIS — I10 Essential (primary) hypertension: Secondary | ICD-10-CM

## 2022-01-27 DIAGNOSIS — N6313 Unspecified lump in the right breast, lower outer quadrant: Secondary | ICD-10-CM

## 2022-01-27 DIAGNOSIS — E113513 Type 2 diabetes mellitus with proliferative diabetic retinopathy with macular edema, bilateral: Secondary | ICD-10-CM

## 2022-01-27 MED ORDER — TRULICITY 3 MG/0.5ML ~~LOC~~ SOAJ: 3.0000 mg | SUBCUTANEOUS | 2 refills | Status: DC

## 2022-01-27 MED ORDER — METFORMIN HCL ER 500 MG PO TB24
ORAL_TABLET | ORAL | 2 refills | Status: DC
  Filled 2022-01-27: qty 90, fill #0

## 2022-01-27 MED ORDER — METFORMIN HCL ER 500 MG PO TB24
ORAL_TABLET | ORAL | 2 refills | Status: DC
  Filled 2022-01-27: qty 90, 25d supply, fill #0

## 2022-01-27 MED ORDER — TRULICITY 3 MG/0.5ML ~~LOC~~ SOAJ
3.0000 mg | SUBCUTANEOUS | 2 refills | Status: DC
  Filled 2022-01-27: qty 2, 28d supply, fill #0

## 2022-01-27 NOTE — Progress Notes (Signed)
° °  CC: Diabetes follow-up  HPI:  [name] is a 71 y.o. female with PMH as below who presents to clinic today for follow-up on her diabetes. Please see problem based charting for evaluation, assessment and plan.  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   Cataract    PRESENT IN LEFT/ CATARACTS REMOVED FROM RIGHT   Cervical radiculopathy due to degenerative joint disease of spine 05/03/2017   CKD (chronic kidney disease) stage 3, GFR 30-59 ml/min (HCC) 01/01/2021   Diabetes mellitus 1996   DKA (diabetic ketoacidoses) 01/02/2013   Gait abnormality 01/10/2019   Glaucoma    Hyperlipidemia    Hypertension    Hypotension    Influenza A (H1N1) 01/03/2013   Nail fungus 10/23/2017   Neuromuscular disorder ([place])    NEUROPATHY   Normocytic anemia 02/14/2013   Osteopenia 05/04/2016   SYNCOPE 05/10/2010   Qualifier: Diagnosis of  By: Marilynne Halsted, RN, BSN, Jacquelyn      Review of Systems:  Constitutional: Negative for polydipsia or fatigue Eyes: Negative for visual changes Respiratory: Negative for shortness of breath Cardiac: Negative for chest pain GU: Negative for polyuria Neuro: Negative for headache, dizziness or weakness  Physical Exam: General: Pleasant, well-appearing elderly woman. No acute distress. Cardiac: RRR. No murmurs, rubs or gallops. No LE edema Respiratory: Lungs CTAB. No wheezing or crackles. Abdominal: Soft, symmetric and non tender. Normal BS. Skin: Warm, dry and intact without rashes or lesions Extremities: Atraumatic. Full ROM.  2+ radial pulses Neuro: A&O x 3. Moves all extremities Psych: Appropriate mood and affect.  Vitals:   01/27/22 1022  BP: 125/67  Pulse: 87  Temp: 97.7 F (36.5 C)  TempSrc: Oral  SpO2: 100%  Weight: 132 lb 11.2 oz (60.2 kg)  Height: 5' 1.5" (1.562 m)     Assessment & Plan:   See Encounters Tab for problem based charting.  Patient  discussed with Dr. Lockie Pares, MD, MPH

## 2022-01-27 NOTE — Patient Instructions (Addendum)
Thank you, [name] for allowing [place] to provide your care today. Today we discussed your diabetes and blood pressure.  We are increasing your metformin to help improve your A1c even further.   I have ordered the following medication/changed the following medications:  Increase metformin dose as below: Take 2 tablets (1000 mg) with breakfast and 1 tablet (500 mg) with dinner for 7 days, then take 2 tablets (1000 mg) with breakfast and 2 tablet (1000 mg) with dinner indefinitely  My Chart Access: [email or web address]  Please follow-up in 1 month for A1c check  Please make sure to arrive 15 minutes prior to your next appointment. If you arrive late, you may be asked to reschedule.    We look forward to seeing you next time. Please call our clinic at [phone] if you have any questions or concerns. The best time to call is Monday-Friday from 9am-4pm, but there is someone available 24/7. If after hours or the weekend, call the main hospital number and ask for the Internal Medicine Resident On-Call. If you need medication refills, please notify your pharmacy one week in advance and they will send [place] a request.   Thank you for letting us take part in your care. Wishing you the best!  Lacinda Axon, MD 01/27/2022, 11:16 AM IM Resident, PGY-2 [facility] 41:10

## 2022-01-27 NOTE — Assessment & Plan Note (Addendum)
Patient's diabetes status controlled on current regiment. A1c of 7.4% 2 months ago. Patient following up after an increase in metformin to 500 mg twice daily. Patient's meter shows fasting sugars in the 150s to 160s. Patient denies any polyuria, polydipsia, dizziness or blurry vision. She has been tolerating the current metformin regimen. Reports she needs refills on her Trulicity. Proteinuria significantly improved compared to last year.  Will uptitrate her metformin dose to the max dose to help  Plan: -- Continue Jardiance 10 mg daily -- Refilled Trulicity 3 mg weekly -- Increase metformin 1000mg  to 1500 mg for 7 days then max dose 2000 mg daily as tolerated. -- Follow-up in 1 month for A1c check

## 2022-01-27 NOTE — Assessment & Plan Note (Addendum)
Scheduled for right breast lumpectomy with radioactive seed localization on March 1st.

## 2022-01-27 NOTE — Assessment & Plan Note (Signed)
Patient's BP well controlled on current regimen.  BP goal <130/80.  BP currently at goal.   Plan: -- Continue chlorthalidone 25 mg daily

## 2022-01-28 ENCOUNTER — Telehealth: Payer: Self-pay

## 2022-01-28 ENCOUNTER — Other Ambulatory Visit (HOSPITAL_COMMUNITY): Payer: Self-pay

## 2022-01-28 NOTE — Progress Notes (Signed)
Internal Medicine Clinic Attending  Case discussed with Dr. [place]  At the time of the visit.  We reviewed the resident's history and exam and pertinent patient test results.  I agree with the assessment, diagnosis, and plan of care documented in the resident's note.  

## 2022-01-28 NOTE — Progress Notes (Signed)
Left vm with pt 11/04/21, company needed proof of income, pt never followed up. Will submit need app for 2023

## 2022-01-28 NOTE — Telephone Encounter (Signed)
Pt called needing assistance with jardiance & trulicity medication. Informed pt that her trulicity enrollment with [employer] ended 12/18/21. Her last shipment went out around the beginning of nov 2022. I mailed a re-enrollment app to pt on 10/21/21 but it was never returned so enrollment ended. I also reached out to pt about her application with [employer] around 11/04/21 explaining the company needed proof of income before approving her but call or info was never returned to office. Application shredded.  Pt would like for me to provide jardiance 10mg  samples for her and give another [employer] app for assistance (medication is $47 at pharmacy). I can provide samples and app on 02/01/22 once back in office. Pt agreed.  As for trulicity, [employer] is currently not taking new applications due to high demand. Informed pt of this info & let her know the copay for this medication is also $47. Encouraged pt to reach out to [employer] about low income subsidy/ Extra help. This will help her copays if approved. Informed pt we have no trulicity 3mg  samples at the moment and if needed she would have to pick it up at her pharmacy. Pt agreed. Told pt I would forward all info to her pcp.

## 2022-01-31 ENCOUNTER — Encounter: Payer: Self-pay | Admitting: Internal Medicine

## 2022-01-31 ENCOUNTER — Other Ambulatory Visit (HOSPITAL_COMMUNITY): Payer: Self-pay

## 2022-01-31 ENCOUNTER — Telehealth: Payer: Self-pay | Admitting: Internal Medicine

## 2022-01-31 DIAGNOSIS — Z794 Long term (current) use of insulin: Secondary | ICD-10-CM

## 2022-01-31 DIAGNOSIS — E1142 Type 2 diabetes mellitus with diabetic polyneuropathy: Secondary | ICD-10-CM

## 2022-01-31 NOTE — Telephone Encounter (Addendum)
DM medications: Jardiance 10 mg, Trulicity 3 mg, Metformin 1000 to 1500 mg then max dose 2000 mg daily as tolerated.  Spoke with Pharmacy, Hortencia Pilar, about patient's access to [facility] and trulicity.   Vania Rea- Talked with patient that she will need to call social security office [phone]) and complete low income subsidy letter. If denied then she can apply for [facility] app for assistance.  I will set aside samples of Jardiance and instructed patient to pick this up on 2/16 at 1:45 pm and have HgbA1c completed then.   Trulicity- unfortunately [employer] is not currently taking new applications (see Ms. Hairstons note 2/10). Patient's co-pay is $47. If patient approved for low income subsidy then this could also reduce co-pay.   Metformin- Last BMP completed 2 months ago with GFR 34 at that time. With renal function patient should be taking up to 500 mg BID. Asked patient to only take Metformin 500 mg BID.   During phone call patient endorsed ear pain and sinusitis that she would like to be seen in clinic for. She will follow-up on 2/16 at 1:45 pm. Instructed patient to pick up jardiance samples and get blood work at that time.  Addendum 02/01/22: Samples have been signed out and [employer] app with samples.

## 2022-02-01 NOTE — Addendum Note (Signed)
Addended by: Edwyna Perfect on: 02/01/2022 05:19 PM   Modules accepted: Orders

## 2022-02-03 ENCOUNTER — Other Ambulatory Visit (HOSPITAL_COMMUNITY): Payer: Self-pay

## 2022-02-03 ENCOUNTER — Encounter: Payer: Self-pay | Admitting: Student

## 2022-02-03 ENCOUNTER — Ambulatory Visit (INDEPENDENT_AMBULATORY_CARE_PROVIDER_SITE_OTHER): Payer: Medicare Other | Admitting: Student

## 2022-02-03 VITALS — BP 142/65 | HR 92 | Temp 98.3°F | Ht 61.5 in | Wt 132.8 lb

## 2022-02-03 DIAGNOSIS — J302 Other seasonal allergic rhinitis: Secondary | ICD-10-CM | POA: Diagnosis not present

## 2022-02-03 DIAGNOSIS — E1142 Type 2 diabetes mellitus with diabetic polyneuropathy: Secondary | ICD-10-CM

## 2022-02-03 DIAGNOSIS — J0100 Acute maxillary sinusitis, unspecified: Secondary | ICD-10-CM

## 2022-02-03 DIAGNOSIS — Z794 Long term (current) use of insulin: Secondary | ICD-10-CM | POA: Diagnosis not present

## 2022-02-03 LAB — GLUCOSE, CAPILLARY: Glucose-Capillary: 97 mg/dL (ref 70–99)

## 2022-02-03 LAB — POCT GLYCOSYLATED HEMOGLOBIN (HGB A1C): Hemoglobin A1C: 8.4 % — AB (ref 4.0–5.6)

## 2022-02-03 MED ORDER — FLUTICASONE PROPIONATE 50 MCG/ACT NA SUSP
1.0000 | Freq: Every day | NASAL | 2 refills | Status: DC
  Filled 2022-02-03: qty 16, 60d supply, fill #0
  Filled 2022-04-05: qty 16, 60d supply, fill #1

## 2022-02-03 MED ORDER — TRULICITY 4.5 MG/0.5ML ~~LOC~~ SOAJ
4.5000 mg | SUBCUTANEOUS | 2 refills | Status: DC
  Filled 2022-02-03: qty 2, 28d supply, fill #0
  Filled 2022-03-03: qty 2, 28d supply, fill #1
  Filled 2022-04-05: qty 2, 28d supply, fill #2

## 2022-02-03 NOTE — Patient Instructions (Addendum)
Thank you, Ms.Darris R Sakata for allowing [place] to provide your care today. Today we discussed your diabetes, sinus infection and blood pressure.    For your diabetes, your A1c has increased to 8.4%. I am increasing your Trulicity and wants you to decrease her sugar intake. We are giving you a sample of the Jardiance today.   Plan sinus infection, Continue using nasal rinses and Zyrtec.  Start using your Flonase and also try over-the-counter Mucinex to help with the cough.  Follow-up in clinic if your symptoms do not improve in the next 1 to 2 weeks or they worsens.   I have ordered the following labs for you:   Lab Orders         Glucose, capillary         BMP8+Anion Gap         POC Hbg A1C      I will call if any are abnormal. All of your labs can be accessed through "My Chart".   I have ordered the following medication/changed the following medications:  Increase Trulicity to 4.5 mg weekly  My Chart Access: [email or web address]  Please follow-up with PCP in 2-3 months  Please make sure to arrive 15 minutes prior to your next appointment. If you arrive late, you may be asked to reschedule.    We look forward to seeing you next time. Please call our clinic at [phone] if you have any questions or concerns. The best time to call is Monday-Friday from 9am-4pm, but there is someone available 24/7. If after hours or the weekend, call the main hospital number and ask for the Internal Medicine Resident On-Call. If you need medication refills, please notify your pharmacy one week in advance and they will send [place] a request.   Thank you for letting us take part in your care. Wishing you the best!  Lacinda Axon, MD 02/03/2022, 2:22 PM IM Resident, PGY-2 [facility]al[facility] Milian 41:10

## 2022-02-03 NOTE — Progress Notes (Signed)
° °  CC: Diabetes follow-up, sinus problems  HPI:  [name] is a 71 y.o. female with PMH as below who presents to clinic for evaluation of sinus infection and follow-up on her diabetes. Please see problem based charting for evaluation, assessment and plan.  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   Cataract    PRESENT IN LEFT/ CATARACTS REMOVED FROM RIGHT   Cervical radiculopathy due to degenerative joint disease of spine 05/03/2017   CKD (chronic kidney disease) stage 3, GFR 30-59 ml/min (HCC) 01/01/2021   Diabetes mellitus 1996   DKA (diabetic ketoacidoses) 01/02/2013   Gait abnormality 01/10/2019   Glaucoma    Hyperlipidemia    Hypertension    Hypotension    Influenza A (H1N1) 01/03/2013   Nail fungus 10/23/2017   Neuromuscular disorder ([place])    NEUROPATHY   Normocytic anemia 02/14/2013   Osteopenia 05/04/2016   SYNCOPE 05/10/2010   Qualifier: Diagnosis of  By: Marilynne Halsted, RN, BSN, Jacquelyn      Review of Systems:  Constitutional: Negative for fever, chills or polydipsia HEENT: Positive for cough, sinus pressure and headache Respiratory: Negative for shortness of breath GU: Negative for polyuria. Neuro: Negative for headache, dizziness or weakness  Physical Exam: General: Pleasant, well-appearing elderly female. No acute distress. HEENT: MMM. Mild tenderness to palpation of the frontal and maxillary sinuses. Mild erythema of the nasal mucosa. No postnasal drainage. R ear canal with moderate earwax, no drainage or erythema.  Normal tympanic membrane. Cardiac: RRR. No murmurs, rubs or gallops. No LE edema Respiratory: Lungs CTAB. No wheezing or crackles. Skin: Warm, dry and intact without rashes or lesions Extremities: Atraumatic. Full ROM.  2+ radial and DP pulses. Neuro: A&O x 3. Moves all extremities. Normal sensation to gross touch. Psych: Appropriate mood and affect.  Vitals:    02/03/22 1346 02/03/22 1431  BP: (!) 156/72 (!) 142/65  Pulse: 92   Temp: 98.3 F (36.8 C)   TempSrc: Oral   SpO2: 100%   Weight: 132 lb 12.8 oz (60.2 kg)   Height: 5' 1.5" (1.562 m)     Assessment & Plan:   See Encounters Tab for problem based charting.  Patient discussed with Dr. Dani Gobble, MD, MPH

## 2022-02-04 ENCOUNTER — Ambulatory Visit: Payer: Medicare Other

## 2022-02-04 ENCOUNTER — Other Ambulatory Visit (HOSPITAL_COMMUNITY): Payer: Self-pay

## 2022-02-04 ENCOUNTER — Telehealth: Payer: Self-pay

## 2022-02-04 ENCOUNTER — Other Ambulatory Visit: Payer: Self-pay

## 2022-02-04 ENCOUNTER — Encounter: Payer: Self-pay | Admitting: Student

## 2022-02-04 DIAGNOSIS — M2012 Hallux valgus (acquired), left foot: Secondary | ICD-10-CM

## 2022-02-04 DIAGNOSIS — M2011 Hallux valgus (acquired), right foot: Secondary | ICD-10-CM

## 2022-02-04 DIAGNOSIS — M2041 Other hammer toe(s) (acquired), right foot: Secondary | ICD-10-CM

## 2022-02-04 DIAGNOSIS — E1142 Type 2 diabetes mellitus with diabetic polyneuropathy: Secondary | ICD-10-CM

## 2022-02-04 LAB — BMP8+ANION GAP
Anion Gap: 14 mmol/L (ref 10.0–18.0)
BUN/Creatinine Ratio: 25 (ref 12–28)
BUN: 41 mg/dL — ABNORMAL HIGH (ref 8–27)
CO2: 21 mmol/L (ref 20–29)
Calcium: 9.4 mg/dL (ref 8.7–10.3)
Chloride: 103 mmol/L (ref 96–106)
Creatinine, Ser: 1.63 mg/dL — ABNORMAL HIGH (ref 0.57–1.00)
Glucose: 100 mg/dL — ABNORMAL HIGH (ref 70–99)
Potassium: 4.9 mmol/L (ref 3.5–5.2)
Sodium: 138 mmol/L (ref 134–144)
eGFR: 34 mL/min/{1.73_m2} — ABNORMAL LOW (ref >59)

## 2022-02-04 MED ORDER — METFORMIN HCL ER 500 MG PO TB24
500.0000 mg | ORAL_TABLET | Freq: Two times a day (BID) | ORAL | 3 refills | Status: DC
  Filled 2022-02-04 – 2022-03-24 (×3): qty 90, 45d supply, fill #0
  Filled 2022-05-18: qty 90, 45d supply, fill #1
  Filled 2022-06-27: qty 84, 42d supply, fill #2
  Filled 2022-06-27: qty 6, 3d supply, fill #2
  Filled 2022-08-17: qty 90, 45d supply, fill #3

## 2022-02-04 NOTE — Progress Notes (Signed)
SITUATION Reason for Consult: Evaluation for Prefabricated Diabetic Shoes and Bilateral Custom Diabetic Inserts. Patient / Caregiver Report: Patient would like well fitting shoes  OBJECTIVE DATA: Patient History / Diagnosis:    ICD-10-CM   1. Diabetic peripheral neuropathy associated with type 2 diabetes mellitus (HCC)  E11.42     2. Hallux valgus, acquired, bilateral  M20.11    M20.12     3. Hammer toe of right foot  M20.41       Current or Previous Devices:   Historical shoe user  In-Person Foot Examination: Ulcers & Callousing:   None  Toe / Foot Deformities:   - Hallux Valgus, Hammertoes   Shoe Size: 86M  ORTHOTIC RECOMMENDATION Recommended Devices: - 1x pair prefabricated PDAC approved diabetic shoes: Patient selects Orthofeet 972 86M - 3x pair custom-to-patient vacuum formed diabetic insoles.   GOALS OF SHOES AND INSOLES - Reduce shear and pressure - Reduce / Prevent callus formation - Reduce / Prevent ulceration - Protect the fragile healing compromised diabetic foot.  Patient would benefit from diabetic shoes and inserts as patient has diabetes mellitus and the patient has one or more of the following conditions: - History of partial or complete amputation of the foot - History of previous foot ulceration. - History of pre-ulcerative callus - Peripheral neuropathy with evidence of callus formation - Foot deformity - Poor circulation  ACTIONS PERFORMED Patient was casted for insoles via crush box and measured for shoes via brannock device. Procedure was explained and patient tolerated procedure well. All questions were answered and concerns addressed.  PLAN Patient is to ensure treating physician receives and completes diabetic paperwork. Casts and shoe order are to be held until paperwork is received. Once received patient is to be scheduled for fitting in four weeks.

## 2022-02-04 NOTE — Assessment & Plan Note (Signed)
Patient here for A1c check.  A1c today slightly increased to 8.4% from 7.4% 2.5 months ago. Blood sugar continues to be in the 120s-160s. She denies any hypoglycemic episodes, blurry vision or headaches. Repeat BMP shows GFR still below 40. Patient unable to take max dose of metformin due to kidney dysfunction. Will increase trulicity to the max dose today and recheck A1C in 3 months. Due to some financial difficulty, samples of Jardiance 10 mg were provided to patient today by Pharmacy patient advocate specialist, Rosendo Gros.   Plan: --Increase Trulicity to 4.5 mg injections weekly --Continue metformin 500 mg twice daily --Continue Jardiance 10 mg daily, can increase to 25 mg if A1c not at goal in 3 months.  --Follow up in 3 months or as needed.  --Medication assistance application has been provided to patient

## 2022-02-04 NOTE — Assessment & Plan Note (Signed)
71 year old lady with a history of seasonal allergies and sinusitis here with persistent sinus symptoms.  Patient reports she has had sinusitis on and off for few years. Over the last week, she has had frontal headache, sinus pain, clear nasal drainage and dry cough that is occasionally productive with clear sputum. She denies any shortness of breath, fevers, chills or postnasal drainage. States she has tried Neti rinses and her zyrtec without significant improvements. Patient reports she has Flonase but has not started using it yet.  Patient's symptoms likely secondary to a viral sinusitis or her seasonal allergy symptoms. We will continue to treat symptomatically for now. Advised patient to follow-up if symptoms worsen and she started developing fevers and chills with green nasal discharge or phlegm.   Plan: -- Restart Flonase -- Continue Zyrtec, try OTC Mucinex -- Continue saline rinses and Tylenol -- Follow-up as needed

## 2022-02-04 NOTE — Telephone Encounter (Signed)
Casts Sent to [place]

## 2022-02-05 NOTE — Progress Notes (Signed)
Internal Medicine Clinic Attending  Case discussed with Dr. [place]  At the time of the visit.  We reviewed the resident's history and exam and pertinent patient test results.  I agree with the assessment, diagnosis, and plan of care documented in the resident's note.  

## 2022-02-09 ENCOUNTER — Ambulatory Visit (INDEPENDENT_AMBULATORY_CARE_PROVIDER_SITE_OTHER): Payer: Medicare Other | Admitting: Podiatry

## 2022-02-09 ENCOUNTER — Other Ambulatory Visit: Payer: Self-pay

## 2022-02-09 ENCOUNTER — Encounter: Payer: Self-pay | Admitting: Podiatry

## 2022-02-09 DIAGNOSIS — L84 Corns and callosities: Secondary | ICD-10-CM | POA: Diagnosis not present

## 2022-02-09 DIAGNOSIS — M79605 Pain in left leg: Secondary | ICD-10-CM

## 2022-02-09 DIAGNOSIS — E1142 Type 2 diabetes mellitus with diabetic polyneuropathy: Secondary | ICD-10-CM | POA: Diagnosis not present

## 2022-02-09 DIAGNOSIS — M79604 Pain in right leg: Secondary | ICD-10-CM | POA: Diagnosis not present

## 2022-02-09 NOTE — Pre-Procedure Instructions (Signed)
Surgical Instructions    Your procedure is scheduled on Wednesday 02/16/22.   Report to [facility] Main Entrance "A" at 06:30 A.M., then check in with the Admitting office.  Call this number if you have problems the morning of surgery:  [phone]   If you have any questions prior to your surgery date call [phone]: Open Monday-Friday 8am-4pm    Remember:  Do not eat after midnight the night before your surgery  You may drink clear liquids until 05:30 A.M. the morning of your surgery.   Clear liquids allowed are: Water, Non-Citrus Juices (without pulp), Carbonated Beverages, Clear Tea, Black Coffee ONLY (NO MILK, CREAM OR POWDERED CREAMER of any kind), and Gatorade    Take these medicines the morning of surgery with A SIP OF WATER:   atorvastatin (LIPITOR)   fluticasone (FLONASE)   Take these medicines if needed:   acetaminophen (TYLENOL)  cetirizine (ZYRTEC)    As of today, STOP taking any Aspirin (unless otherwise instructed by your surgeon) diclofenac Sodium (VOLTAREN), Aleve, Naproxen, Ibuprofen, Motrin, Advil, Goody's, BC's, all herbal medications, fish oil, and all vitamins.          WHAT DO I DO ABOUT MY DIABETES MEDICATION?  DO NOT TAKE empagliflozin (JARDIANCE) the day before surgery 02/15/22 or the morning of surgery 02/16/22.    DO NOT TAKE metFORMIN (GLUCOPHAGE-XR) the morning of surgery.   The day of surgery, do not take other diabetes injectables, including Byetta (exenatide), Bydureon (exenatide ER), Victoza (liraglutide), or Trulicity (dulaglutide).    HOW TO MANAGE YOUR DIABETES BEFORE AND AFTER SURGERY  Why is it important to control my blood sugar before and after surgery? Improving blood sugar levels before and after surgery helps healing and can limit problems. A way of improving blood sugar control is eating a healthy diet by:  Eating less sugar and carbohydrates  Increasing activity/exercise  Talking with your doctor about reaching your blood  sugar goals High blood sugars (greater than 180 mg/dL) can raise your risk of infections and slow your recovery, so you will need to focus on controlling your diabetes during the weeks before surgery. Make sure that the doctor who takes care of your diabetes knows about your planned surgery including the date and location.  How do I manage my blood sugar before surgery? Check your blood sugar at least 4 times a day, starting 2 days before surgery, to make sure that the level is not too high or low.  Check your blood sugar the morning of your surgery when you wake up and every 2 hours until you get to the Short Stay unit.  If your blood sugar is less than 70 mg/dL, you will need to treat for low blood sugar: Do not take insulin. Treat a low blood sugar (less than 70 mg/dL) with  cup of clear juice (cranberry or apple), 4 glucose tablets, OR glucose gel. Recheck blood sugar in 15 minutes after treatment (to make sure it is greater than 70 mg/dL). If your blood sugar is not greater than 70 mg/dL on recheck, call [phone] for further instructions. Report your blood sugar to the short stay nurse when you get to Short Stay.  If you are admitted to the hospital after surgery: Your blood sugar will be checked by the staff and you will probably be given insulin after surgery (instead of oral diabetes medicines) to make sure you have good blood sugar levels. The goal for blood sugar control after surgery is 80-180 mg/dL.  Do not wear jewelry or makeup Do not wear lotions, powders, perfumes/colognes, or deodorant. Do not shave 48 hours prior to surgery.  Men may shave face and neck. Do not bring valuables to the hospital. Do not wear nail polish, gel polish, artificial nails, or any other type of covering on natural nails (fingers and toes) If you have artificial nails or gel coating that need to be removed by a nail salon, please have this removed prior to surgery. Artificial nails or gel coating  may interfere with anesthesia's ability to adequately monitor your vital signs.  [facility] is not responsible for any belongings or valuables. .   Do NOT Smoke (Tobacco/Vaping)  24 hours prior to your procedure  If you use a CPAP at night, you may bring your mask for your overnight stay.   Contacts, glasses, hearing aids, dentures or partials may not be worn into surgery, please bring cases for these belongings   For patients admitted to the hospital, discharge time will be determined by your treatment team.   Patients discharged the day of surgery will not be allowed to drive home, and someone needs to stay with them for 24 hours.  NO VISITORS WILL BE ALLOWED IN PRE-OP WHERE PATIENTS ARE PREPPED FOR SURGERY.  ONLY 1 SUPPORT PERSON MAY BE PRESENT IN THE WAITING ROOM WHILE YOU ARE IN SURGERY.  IF YOU ARE TO BE ADMITTED, ONCE YOU ARE IN YOUR ROOM YOU WILL BE ALLOWED TWO (2) VISITORS. 1 (ONE) VISITOR MAY STAY OVERNIGHT BUT MUST ARRIVE TO THE ROOM BY 8pm.  Minor children may have two parents present. Special consideration for safety and communication needs will be reviewed on a case by case basis.  Special instructions:    Oral Hygiene is also important to reduce your risk of infection.  Remember - BRUSH YOUR TEETH THE MORNING OF SURGERY WITH YOUR REGULAR TOOTHPASTE   [facility]- Preparing For Surgery  Before surgery, you can play an important role. Because skin is not sterile, your skin needs to be as free of germs as possible. You can reduce the number of germs on your skin by washing with CHG (chlorahexidine gluconate) Soap before surgery.  CHG is an antiseptic cleaner which kills germs and bonds with the skin to continue killing germs even after washing.     Please do not use if you have an allergy to CHG or antibacterial soaps. If your skin becomes reddened/irritated stop using the CHG.  Do not shave (including legs and underarms) for at least 48 hours prior to first CHG shower. It is  OK to shave your face.  Please follow these instructions carefully.     Shower the NIGHT BEFORE SURGERY and the MORNING OF SURGERY with CHG Soap.   If you chose to wash your hair, wash your hair first as usual with your normal shampoo. After you shampoo, rinse your hair and body thoroughly to remove the shampoo.  Then [employer] and genitals (private parts) with your normal soap and rinse thoroughly to remove soap.  After that Use CHG Soap as you would any other liquid soap. You can apply CHG directly to the skin and wash gently with a scrungie or a clean washcloth.   Apply the CHG Soap to your body ONLY FROM THE NECK DOWN.  Do not use on open wounds or open sores. Avoid contact with your eyes, ears, mouth and genitals (private parts). Wash Face and genitals (private parts)  with your normal soap.  Wash thoroughly, paying special attention to the area where your surgery will be performed.  Thoroughly rinse your body with warm water from the neck down.  DO NOT shower/wash with your normal soap after using and rinsing off the CHG Soap.  Pat yourself dry with a CLEAN TOWEL.  Wear CLEAN PAJAMAS to bed the night before surgery  Place CLEAN SHEETS on your bed the night before your surgery  DO NOT SLEEP WITH PETS.   Day of Surgery:  Take a shower with CHG soap. Wear Clean/Comfortable clothing the morning of surgery Do not apply any deodorants/lotions.   Remember to brush your teeth WITH YOUR REGULAR TOOTHPASTE.    COVID testing  If you are going to stay overnight or be admitted after your procedure/surgery and require a pre-op COVID test, please follow these instructions after your COVID test   You are not required to quarantine however you are required to wear a well-fitting mask when you are out and around people not in your household.  If your mask becomes wet or soiled, replace with a new one.  Wash your hands often with soap and water for 20 seconds or clean your hands with an  alcohol-based hand sanitizer that contains at least 60% alcohol.  Do not share personal items.  Notify your provider: if you are in close contact with someone who has COVID  or if you develop a fever of 100.4 or greater, sneezing, cough, sore throat, shortness of breath or body aches.    Please read over the following fact sheets that you were given.  t

## 2022-02-10 ENCOUNTER — Other Ambulatory Visit: Payer: Self-pay

## 2022-02-10 ENCOUNTER — Encounter (HOSPITAL_COMMUNITY): Payer: Self-pay

## 2022-02-10 ENCOUNTER — Encounter (HOSPITAL_COMMUNITY)
Admission: RE | Admit: 2022-02-10 | Discharge: 2022-02-10 | Disposition: A | Discharge status: Home / Self Care | Payer: Medicare Other | Source: Ambulatory Visit | Attending: Surgery | Admitting: Surgery

## 2022-02-10 VITALS — BP 133/90 | HR 87 | Temp 98.6°F | Resp 17 | Ht 61.5 in | Wt 128.9 lb

## 2022-02-10 DIAGNOSIS — Z01818 Encounter for other preprocedural examination: Secondary | ICD-10-CM

## 2022-02-10 HISTORY — DX: Family history of other specified conditions: Z84.89

## 2022-02-10 LAB — CBC
HCT: 33.5 % — ABNORMAL LOW (ref 36.0–46.0)
Hemoglobin: 10.8 g/dL — ABNORMAL LOW (ref 12.0–15.0)
MCH: 30.1 pg (ref 26.0–34.0)
MCHC: 32.2 g/dL (ref 30.0–36.0)
MCV: 93.3 fL (ref 80.0–100.0)
Platelets: 335 10*3/uL (ref 150–400)
RBC: 3.59 MIL/uL — ABNORMAL LOW (ref 3.87–5.11)
RDW: 12.2 % (ref 11.5–15.5)
WBC: 6.5 10*3/uL (ref 4.0–10.5)
nRBC: 0 % (ref 0.0–0.2)

## 2022-02-10 LAB — GLUCOSE, CAPILLARY: Glucose-Capillary: 149 mg/dL — ABNORMAL HIGH (ref 70–99)

## 2022-02-10 NOTE — Progress Notes (Addendum)
PCP - Dr. Christiana Fuchs Cardiologist -   PPM/ICD - Denies  Chest x-ray - N/A EKG - 02/10/22 Stress Test - Denies ECHO - 12/29/20 Cardiac Cath - Denies  Sleep Study - Denies  Fasting Blood Sugar - 72-130's Checks Blood Sugar __2-3___ times a day  Blood Thinner Instructions: N/A Aspirin Instructions: Follow surgeon's instructions  ERAS Protcol - Yes PRE-SURGERY Ensure or G2- No  COVID TEST- N/A   Anesthesia review: Yes, seed placement Patient denies shortness of breath, fever, cough and chest pain at PAT appointment   All instructions explained to the patient, with a verbal understanding of the material. Patient agrees to go over the instructions while at home for a better understanding. Patient also instructed to self quarantine after being tested for COVID-19. The opportunity to ask questions was provided.

## 2022-02-11 NOTE — Progress Notes (Addendum)
Anesthesia Chart Review:  Case: [id number] Date/Time: 02/16/22 0815   Procedure: RIGHT BREAST LUMPECTOMY WITH RADIOACTIVE SEED LOCALIZATION (Right: Breast)   Anesthesia type: General   Pre-op diagnosis: RIGHT BREAST MASS   Location: [facility] OR ROOM 08 / [facility]   Surgeons: Erroll Luna, MD       DISCUSSION: Patient is a 71 year old female scheduled for the above procedure. Procedure recommended for discordant mammogram and core biopsy. 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 Kristopher Oppenheim, with excision recommended."  Other history includes never smoker, DM2 (DKA 12/2012), neuropathy, anemia, HTN, HLD, glaucoma, cervical radiculopathy (2018), CKD, hysterectomy/BSO (12/02/03), presyncope (in setting of dehydration/orthostasis, + Dix-Hallpike test 03/2010). Reported her mother was slow to wake up after anesthesia in the past.   RSL scheduled for 02/15/22 at 8:30 AM. Anesthesia team to evaluate on the day of surgery.   VS: BP 133/90    Pulse 87    Temp 37 C (Oral)    Resp 17    Ht 5' 1.5" (1.562 m)    Wt 58.5 kg    SpO2 100%    BMI 23.96 kg/m    PROVIDERS: [name], Joellen Jersey, DO is PCP ([place]) - Owens Loffler, MD is GI - She is not followed by cardiology. Evaluation by Dorris Carnes, MD on 05/10/10 following hospitalization for pre-syncope, prior syncope 2008. She was orthostatic and BP medication adjusted.   LABS: Preoperative labs noted. Cr 1.63, previously 1.42-1.64 since 06/2021. H/H 10.8/33.5, down from 11.6/36.9 on 12/02/21. A1c 8.4% on 02/03/22.  (all labs ordered are listed, but only abnormal results are displayed)  Labs Reviewed  GLUCOSE, CAPILLARY - Abnormal; Notable for the following components:      Result Value   Glucose-Capillary 149 (*)    All other components within normal limits  CBC - Abnormal; Notable for  the following components:   RBC 3.59 (*)    Hemoglobin 10.8 (*)    HCT 33.5 (*)    All other components within normal limits     IMAGES: US Renal 12/03/21: IMPRESSION: 1. No hydronephrosis within the limitations of the exam. Mildly increased renal echogenicity which is consistent with medical renal disease in the appropriate clinical setting.   CTA Head/Neck 09/09/19: IMPRESSION: 1. 60% diameter stenosis proximal right internal carotid artery due to heavily calcified plaque. Moderate stenosis right cavernous carotid. 2. Mild atherosclerotic calcification left carotid bulb without stenosis. Moderate stenosis left cavernous carotid due to calcific stenosis. 3. Mild stenosis of the vertebral artery bilaterally at the skull base otherwise clear 4. No intracranial large vessel occlusion. Mild stenosis right M1 segment. 5. 6 x 10 mm ossification ligamentum flavum on the right at C6-7 may represent meningioma.    EKG: 02/10/22: NSR   CV: Echo 12/29/20: IMPRESSIONS   1. Left ventricular ejection fraction, by estimation, is 60 to 65%. The  left ventricle has normal function. The left ventricle has no regional  wall motion abnormalities. Left ventricular diastolic parameters were  normal.   2. Right ventricular systolic function is normal. The right ventricular  size is normal. There is normal pulmonary artery systolic pressure.   3. The mitral valve is normal in structure. Mild mitral valve  regurgitation. No evidence of mitral stenosis.   4. The aortic valve is tricuspid. Aortic valve regurgitation is not  visualized. No aortic stenosis is present.  5. The inferior vena cava is normal in size with greater than 50%  respiratory variability, suggesting right atrial pressure of 3 mmHg.  - Comparison(s): No significant change from prior study.  - Conclusion(s)/Recommendation(s): Normal biventricular function without  evidence of hemodynamically significant valvular heart disease.     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   Cataract    PRESENT IN LEFT/ CATARACTS REMOVED FROM RIGHT   Cervical radiculopathy due to degenerative joint disease of spine 05/03/2017   CKD (chronic kidney disease) stage 3, GFR 30-59 ml/min (HCC) 01/01/2021   Diabetes mellitus 1996   DKA (diabetic ketoacidoses) 01/02/2013   Family history of adverse reaction to anesthesia 01/02/2013   Mother was slow to wake up after a procedure   Gait abnormality 01/10/2019   Glaucoma    Hyperlipidemia    Hypertension    Hypotension    Influenza A (H1N1) 01/03/2013   Nail fungus 10/23/2017   Neuromuscular disorder ([place])    NEUROPATHY   Normocytic anemia 02/14/2013   Osteopenia 05/04/2016   SYNCOPE 05/10/2010   Qualifier: Diagnosis of  By: Marilynne Halsted, RN, BSN, Jacquelyn      Past Surgical History:  Procedure Laterality Date   ABDOMINAL HYSTERECTOMY     10 years ago    BREAST BIOPSY     Cataracts     ROTATOR CUFF REPAIR     SHOULDER SURGERY     Rotator cuff tear     MEDICATIONS:  acetaminophen (TYLENOL) 325 MG tablet   aspirin EC 81 MG tablet   atorvastatin (LIPITOR) 40 MG tablet   blood glucose meter kit and supplies KIT   cetirizine (ZYRTEC) 5 MG tablet   chlorthalidone (HYGROTON) 25 MG tablet   Cholecalciferol (VITAMIN D3) 2000 units capsule   diclofenac Sodium (VOLTAREN) 1 % GEL   Dulaglutide (TRULICITY) 4.5 LT/9.0ZE SOPN   empagliflozin (JARDIANCE) 10 MG TABS tablet   fluticasone (FLONASE) 50 MCG/ACT nasal spray   latanoprost (XALATAN) 0.005 % ophthalmic solution   metFORMIN (GLUCOPHAGE-XR) 500 MG 24 hr tablet   Multiple Vitamins-Minerals (WOMENS 50+ MULTI VITAMIN/MIN) TABS   No current facility-administered medications for this encounter.    Myra Gianotti, PA-C Surgical Short Stay/Anesthesiology [facility] Phone [phone] [facility] Phone [phone] 02/14/2022 1:24 PM

## 2022-02-11 NOTE — Anesthesia Preprocedure Evaluation (Addendum)
Anesthesia Evaluation  Patient identified by MRN, date of birth, ID band Patient awake    Reviewed: Allergy & Precautions, NPO status , Patient's Chart, lab work & pertinent test results  History of Anesthesia Complications Negative for: history of anesthetic complications  Airway Mallampati: III  TM Distance: >3 FB     Dental  (+) Teeth Intact, Dental Advisory Given,    Pulmonary neg pulmonary ROS,    breath sounds clear to auscultation       Cardiovascular hypertension, Pt. on medications (-) angina(-) Past MI and (-) CHF  Rhythm:Regular  1. Left ventricular ejection fraction, by estimation, is 60 to 65%. The  left ventricle has normal function. The left ventricle has no regional  wall motion abnormalities. Left ventricular diastolic parameters were  normal.  2. Right ventricular systolic function is normal. The right ventricular  size is normal. There is normal pulmonary artery systolic pressure.  3. The mitral valve is normal in structure. Mild mitral valve  regurgitation. No evidence of mitral stenosis.  4. The aortic valve is tricuspid. Aortic valve regurgitation is not  visualized. No aortic stenosis is present.  5. The inferior vena cava is normal in size with greater than 50%  respiratory variability, suggesting right atrial pressure of 3 mmHg.    Neuro/Psych PSYCHIATRIC DISORDERS Depression  Neuromuscular disease    GI/Hepatic Neg liver ROS,   Endo/Other  diabetes, Type 2Lab Results      Component                Value               Date                      HGBA1C                   8.4 (A)             02/03/2022             Renal/GU CRFRenal diseaseLab Results      Component                Value               Date                      CREATININE               1.63 (H)            02/03/2022                Musculoskeletal  (+) Arthritis ,   Abdominal   Peds  Hematology  (+) Blood dyscrasia, anemia ,  Lab Results      Component                Value               Date                      WBC                      6.5                 02/10/2022                HGB  10.8 (L)            02/10/2022                HCT                      33.5 (L)            02/10/2022                MCV                      93.3                02/10/2022                PLT                      335                 02/10/2022              Anesthesia Other Findings   Reproductive/Obstetrics                            Anesthesia Physical Anesthesia Plan  ASA: 3  Anesthesia Plan: General   Post-op Pain Management: Tylenol PO (pre-op)*   Induction: Intravenous  PONV Risk Score and Plan: Ondansetron, Dexamethasone and Propofol infusion  Airway Management Planned: LMA and Oral ETT  Additional Equipment: None  Intra-op Plan:   Post-operative Plan: Extubation in OR  Informed Consent: I have reviewed the patients History and Physical, chart, labs and discussed the procedure including the risks, benefits and alternatives for the proposed anesthesia with the patient or authorized representative who has indicated his/her understanding and acceptance.     Dental advisory given  Plan Discussed with: CRNA and Anesthesiologist  Anesthesia Plan Comments: (PAT note written 02/14/2022 by Myra Gianotti, PA-C. )      Anesthesia Quick Evaluation

## 2022-02-14 ENCOUNTER — Encounter (HOSPITAL_COMMUNITY): Payer: Self-pay | Admitting: Surgery

## 2022-02-15 ENCOUNTER — Ambulatory Visit
Admission: RE | Admit: 2022-02-15 | Discharge: 2022-02-15 | Disposition: A | Discharge status: Home / Self Care | Payer: Medicare Other | Source: Ambulatory Visit | Attending: Surgery | Admitting: Surgery

## 2022-02-15 DIAGNOSIS — N6311 Unspecified lump in the right breast, upper outer quadrant: Secondary | ICD-10-CM

## 2022-02-15 NOTE — Progress Notes (Signed)
Subjective: Amanda [name] is a 71 y.o. female patient seen today for follow up of  at risk foot care with history of diabetic neuropathy and painful thick toenails that are difficult to trim. Pain interferes with ambulation. Aggravating factors include wearing enclosed shoe gear. Pain is relieved with periodic professional debridement..   Patient states blood glucose was 130 mg/dl today.   New problem(s)/concern(s) today: None    PCP is [name], [place], DO. Last visit was: 11/17/2021.  Allergies  Allergen Reactions   Gabapentin Other (See Comments)    Pt states medication made her feel "out of it".   Pt states medication made her feel "out of it".      Objective: Physical Exam  General: Patient is a pleasant 71 y.o. African American female WD, WN in NAD. AAO x 3.   Neurovascular Examination: Capillary refill time to digits immediate b/l. Palpable pedal pulses b/l LE. Pedal hair present. No pain with calf compression b/l. Lower extremity skin temperature gradient within normal limits. No edema noted b/l LE. No cyanosis or clubbing noted b/l LE.  Pt has subjective symptoms of neuropathy. Protective sensation intact 5/5 intact bilaterally with 10g monofilament b/l. Vibratory sensation intact b/l.  Dermatological:  Pedal integument with normal turgor, texture and tone BLE. No open wounds b/l LE. No interdigital macerations noted b/l LE. Toenail(s) 1-5 bilaterally elongated, discolored, dystrophic, thickened >1/4 inch. Nails are crumbly with subungual debris and there is exquisite tenderness to dorsal palpation. No subungual wound(s) noted. Hyperkeratotic lesion(s) R 5th toe.  No erythema, no edema, no drainage, no fluctuance.  Musculoskeletal:  Muscle strength 5/5 to all lower extremity muscle groups bilaterally. HAV with bunion deformity noted b/l LE. Hammertoe(s) noted to the R 5th toe.  Assessment: 1. Pain in both lower extremities   2. Corns   3. Diabetic peripheral neuropathy  associated with type 2 diabetes mellitus ([place])    Plan: Patient was evaluated and treated and all questions answered. Consent given for treatment as described below: -Toenails 1-5 b/l were debrided in length and girth with sterile nail nippers and dremel without iatrogenic bleeding.  -Corn(s) R 5th toe pared utilizing sterile scalpel blade without complication or incident. Total number debrided=1. -Patient/POA to call should there be question/concern in the interim.  Return in about 3 months (around 05/09/2022).  Marzetta Board, DPM

## 2022-02-16 ENCOUNTER — Ambulatory Visit (HOSPITAL_COMMUNITY)
Admission: RE | Admit: 2022-02-16 | Discharge: 2022-02-16 | Disposition: A | Discharge status: Home / Self Care | Payer: Medicare Other | Attending: Surgery | Admitting: Surgery

## 2022-02-16 ENCOUNTER — Ambulatory Visit
Admission: RE | Admit: 2022-02-16 | Discharge: 2022-02-16 | Disposition: A | Discharge status: Home / Self Care | Payer: Medicare Other | Source: Ambulatory Visit | Attending: Surgery | Admitting: Surgery

## 2022-02-16 ENCOUNTER — Other Ambulatory Visit (HOSPITAL_COMMUNITY): Payer: Self-pay

## 2022-02-16 ENCOUNTER — Ambulatory Visit (HOSPITAL_BASED_OUTPATIENT_CLINIC_OR_DEPARTMENT_OTHER): Payer: Medicare Other

## 2022-02-16 ENCOUNTER — Encounter (HOSPITAL_COMMUNITY): Payer: Self-pay | Admitting: Surgery

## 2022-02-16 ENCOUNTER — Encounter (HOSPITAL_COMMUNITY)
Admission: RE | Disposition: A | Discharge status: Home / Self Care | Payer: Self-pay | Source: Home / Self Care | Attending: Surgery

## 2022-02-16 ENCOUNTER — Ambulatory Visit (HOSPITAL_COMMUNITY): Payer: Medicare Other | Admitting: Vascular Surgery

## 2022-02-16 ENCOUNTER — Other Ambulatory Visit: Payer: Self-pay

## 2022-02-16 DIAGNOSIS — D242 Benign neoplasm of left breast: Secondary | ICD-10-CM

## 2022-02-16 DIAGNOSIS — Z79899 Other long term (current) drug therapy: Secondary | ICD-10-CM | POA: Insufficient documentation

## 2022-02-16 DIAGNOSIS — I10 Essential (primary) hypertension: Secondary | ICD-10-CM | POA: Insufficient documentation

## 2022-02-16 DIAGNOSIS — N6091 Unspecified benign mammary dysplasia of right breast: Secondary | ICD-10-CM | POA: Insufficient documentation

## 2022-02-16 DIAGNOSIS — N6031 Fibrosclerosis of right breast: Secondary | ICD-10-CM | POA: Diagnosis not present

## 2022-02-16 DIAGNOSIS — G709 Myoneural disorder, unspecified: Secondary | ICD-10-CM | POA: Diagnosis not present

## 2022-02-16 DIAGNOSIS — E119 Type 2 diabetes mellitus without complications: Secondary | ICD-10-CM | POA: Diagnosis not present

## 2022-02-16 DIAGNOSIS — N6311 Unspecified lump in the right breast, upper outer quadrant: Secondary | ICD-10-CM

## 2022-02-16 DIAGNOSIS — Z803 Family history of malignant neoplasm of breast: Secondary | ICD-10-CM | POA: Insufficient documentation

## 2022-02-16 DIAGNOSIS — N61 Mastitis without abscess: Secondary | ICD-10-CM | POA: Insufficient documentation

## 2022-02-16 DIAGNOSIS — N6489 Other specified disorders of breast: Secondary | ICD-10-CM | POA: Diagnosis present

## 2022-02-16 HISTORY — PX: BREAST LUMPECTOMY WITH RADIOACTIVE SEED LOCALIZATION: SHX6424

## 2022-02-16 LAB — GLUCOSE, CAPILLARY
Glucose-Capillary: 167 mg/dL — ABNORMAL HIGH (ref 70–99)
Glucose-Capillary: 124 mg/dL — ABNORMAL HIGH (ref 70–99)

## 2022-02-16 SURGERY — BREAST LUMPECTOMY WITH RADIOACTIVE SEED LOCALIZATION
Anesthesia: General | Site: Breast | Laterality: Right

## 2022-02-16 MED ORDER — BUPIVACAINE-EPINEPHRINE (PF) 0.25% -1:200000 IJ SOLN
INTRAMUSCULAR | Status: AC
  Filled 2022-02-16: qty 30

## 2022-02-16 MED ORDER — PHENYLEPHRINE HCL-NACL 20-0.9 MG/250ML-% IV SOLN
INTRAVENOUS | Status: DC | PRN
  Administered 2022-02-16: 40 ug/min via INTRAVENOUS

## 2022-02-16 MED ORDER — FENTANYL CITRATE (PF) 100 MCG/2ML IJ SOLN: 25.0000 ug | INTRAMUSCULAR | Status: DC | PRN

## 2022-02-16 MED ORDER — ORAL CARE MOUTH RINSE
15.0000 mL | Freq: Once | OROMUCOSAL | Status: AC
Start: 2022-02-16 — End: 2022-02-16

## 2022-02-16 MED ORDER — FENTANYL CITRATE (PF) 250 MCG/5ML IJ SOLN
INTRAMUSCULAR | Status: DC | PRN
  Administered 2022-02-16: 25 ug via INTRAVENOUS

## 2022-02-16 MED ORDER — PHENYLEPHRINE 40 MCG/ML (10ML) SYRINGE FOR IV PUSH (FOR BLOOD PRESSURE SUPPORT)
PREFILLED_SYRINGE | INTRAVENOUS | Status: DC | PRN
  Administered 2022-02-16 (×2): 120 ug via INTRAVENOUS

## 2022-02-16 MED ORDER — ONDANSETRON HCL 4 MG/2ML IJ SOLN
INTRAMUSCULAR | Status: DC | PRN
Start: 2022-02-16 — End: 2022-02-16
  Administered 2022-02-16: 4 mg via INTRAVENOUS

## 2022-02-16 MED ORDER — BUPIVACAINE-EPINEPHRINE 0.25% -1:200000 IJ SOLN
INTRAMUSCULAR | Status: DC | PRN
  Administered 2022-02-16: 20 mL

## 2022-02-16 MED ORDER — CHLORHEXIDINE GLUCONATE CLOTH 2 % EX PADS: 6.0000 | MEDICATED_PAD | Freq: Once | CUTANEOUS | Status: DC

## 2022-02-16 MED ORDER — ACETAMINOPHEN 10 MG/ML IV SOLN: 1000.0000 mg | Freq: Once | INTRAVENOUS | Status: DC | PRN

## 2022-02-16 MED ORDER — MIDAZOLAM HCL 2 MG/2ML IJ SOLN
INTRAMUSCULAR | Status: DC | PRN
  Administered 2022-02-16: 1 mg via INTRAVENOUS

## 2022-02-16 MED ORDER — 0.9 % SODIUM CHLORIDE (POUR BTL) OPTIME
TOPICAL | Status: DC | PRN
  Administered 2022-02-16: 1000 mL

## 2022-02-16 MED ORDER — ONDANSETRON HCL 4 MG/2ML IJ SOLN
INTRAMUSCULAR | Status: AC
  Filled 2022-02-16: qty 2

## 2022-02-16 MED ORDER — LIDOCAINE 2% (20 MG/ML) 5 ML SYRINGE
INTRAMUSCULAR | Status: DC | PRN
  Administered 2022-02-16: 60 mg via INTRAVENOUS

## 2022-02-16 MED ORDER — ACETAMINOPHEN 500 MG PO TABS
1000.0000 mg | ORAL_TABLET | ORAL | Status: AC
  Administered 2022-02-16: 1000 mg via ORAL
  Filled 2022-02-16: qty 2

## 2022-02-16 MED ORDER — ACETAMINOPHEN 500 MG PO TABS: 1000.0000 mg | ORAL_TABLET | Freq: Once | ORAL | Status: DC | PRN

## 2022-02-16 MED ORDER — FENTANYL CITRATE (PF) 250 MCG/5ML IJ SOLN
INTRAMUSCULAR | Status: AC
  Filled 2022-02-16: qty 5

## 2022-02-16 MED ORDER — OXYCODONE HCL 5 MG PO TABS
5.0000 mg | ORAL_TABLET | Freq: Four times a day (QID) | ORAL | 0 refills | Status: DC | PRN
  Filled 2022-02-16: qty 15, 4d supply, fill #0

## 2022-02-16 MED ORDER — OXYCODONE HCL 5 MG PO TABS: 5.0000 mg | ORAL_TABLET | Freq: Once | ORAL | Status: DC | PRN

## 2022-02-16 MED ORDER — INSULIN ASPART 100 UNIT/ML IJ SOLN
INTRAMUSCULAR | Status: AC
  Administered 2022-02-16: 2 [IU] via SUBCUTANEOUS
  Filled 2022-02-16: qty 1

## 2022-02-16 MED ORDER — LIDOCAINE 2% (20 MG/ML) 5 ML SYRINGE
INTRAMUSCULAR | Status: AC
  Filled 2022-02-16: qty 5

## 2022-02-16 MED ORDER — DEXAMETHASONE SODIUM PHOSPHATE 10 MG/ML IJ SOLN
INTRAMUSCULAR | Status: DC | PRN
  Administered 2022-02-16: 5 mg via INTRAVENOUS

## 2022-02-16 MED ORDER — CEFAZOLIN SODIUM-DEXTROSE 2-4 GM/100ML-% IV SOLN
2.0000 g | INTRAVENOUS | Status: AC
  Administered 2022-02-16: 2 g via INTRAVENOUS
  Filled 2022-02-16: qty 100

## 2022-02-16 MED ORDER — PROPOFOL 500 MG/50ML IV EMUL
INTRAVENOUS | Status: DC | PRN
Start: 2022-02-16 — End: 2022-02-16
  Administered 2022-02-16: 125 ug/kg/min via INTRAVENOUS

## 2022-02-16 MED ORDER — LACTATED RINGERS IV SOLN: INTRAVENOUS | Status: DC

## 2022-02-16 MED ORDER — CHLORHEXIDINE GLUCONATE 0.12 % MT SOLN
15.0000 mL | Freq: Once | OROMUCOSAL | Status: AC
  Administered 2022-02-16: 15 mL via OROMUCOSAL
  Filled 2022-02-16: qty 15

## 2022-02-16 MED ORDER — PROPOFOL 10 MG/ML IV BOLUS
INTRAVENOUS | Status: AC
  Filled 2022-02-16: qty 20

## 2022-02-16 MED ORDER — PROPOFOL 10 MG/ML IV BOLUS
INTRAVENOUS | Status: DC | PRN
  Administered 2022-02-16: 50 mg via INTRAVENOUS
  Administered 2022-02-16: 200 mg via INTRAVENOUS

## 2022-02-16 MED ORDER — MIDAZOLAM HCL 2 MG/2ML IJ SOLN
INTRAMUSCULAR | Status: AC
  Filled 2022-02-16: qty 2

## 2022-02-16 MED ORDER — OXYCODONE HCL 5 MG/5ML PO SOLN: 5.0000 mg | Freq: Once | ORAL | Status: DC | PRN

## 2022-02-16 MED ORDER — IBUPROFEN 800 MG PO TABS
800.0000 mg | ORAL_TABLET | Freq: Three times a day (TID) | ORAL | 0 refills | Status: DC | PRN
  Filled 2022-02-16: qty 30, 10d supply, fill #0

## 2022-02-16 MED ORDER — INSULIN ASPART 100 UNIT/ML IJ SOLN: 0.0000 [IU] | INTRAMUSCULAR | Status: DC | PRN

## 2022-02-16 MED ORDER — ACETAMINOPHEN 160 MG/5ML PO SOLN: 1000.0000 mg | Freq: Once | ORAL | Status: DC | PRN

## 2022-02-16 SURGICAL SUPPLY — 39 items
ADH SKN CLS APL DERMABOND .7 (GAUZE/BANDAGES/DRESSINGS) ×1
APL PRP STRL LF DISP 70% ISPRP (MISCELLANEOUS) ×1
APPLIER CLIP 9.375 MED OPEN (MISCELLANEOUS) ×2
APR CLP MED 9.3 20 MLT OPN (MISCELLANEOUS) ×1
BAG COUNTER SPONGE SURGICOUNT (BAG) ×2 IMPLANT
BAG SPNG CNTER NS LX DISP (BAG) ×1
BINDER BREAST XLRG (GAUZE/BANDAGES/DRESSINGS) ×1 IMPLANT
CANISTER SUCT 3000ML PPV (MISCELLANEOUS) IMPLANT
CHLORAPREP W/TINT 26 (MISCELLANEOUS) ×2 IMPLANT
CLIP APPLIE 9.375 MED OPEN (MISCELLANEOUS) IMPLANT
COVER PROBE W GEL 5X96 (DRAPES) ×2 IMPLANT
COVER SURGICAL LIGHT HANDLE (MISCELLANEOUS) ×2 IMPLANT
DERMABOND ADVANCED (GAUZE/BANDAGES/DRESSINGS) ×1
DERMABOND ADVANCED .7 DNX12 (GAUZE/BANDAGES/DRESSINGS) ×1 IMPLANT
DEVICE DUBIN SPECIMEN MAMMOGRA (MISCELLANEOUS) ×2 IMPLANT
DRAPE CHEST BREAST 15X10 FENES (DRAPES) ×2 IMPLANT
ELECT CAUTERY BLADE 6.4 (BLADE) ×2 IMPLANT
ELECT REM PT RETURN 9FT ADLT (ELECTROSURGICAL) ×2
ELECTRODE REM PT RTRN 9FT ADLT (ELECTROSURGICAL) ×1 IMPLANT
GLOVE SRG 8 PF TXTR STRL LF DI (GLOVE) ×1 IMPLANT
GLOVE SURG ENC MOIS LTX SZ8 (GLOVE) ×2 IMPLANT
GLOVE SURG UNDER POLY LF SZ8 (GLOVE) ×2
GOWN STRL REUS W/ TWL LRG LVL3 (GOWN DISPOSABLE) ×1 IMPLANT
GOWN STRL REUS W/ TWL XL LVL3 (GOWN DISPOSABLE) ×1 IMPLANT
GOWN STRL REUS W/TWL LRG LVL3 (GOWN DISPOSABLE) ×2
GOWN STRL REUS W/TWL XL LVL3 (GOWN DISPOSABLE) ×2
KIT BASIN OR (CUSTOM PROCEDURE TRAY) ×2 IMPLANT
KIT MARKER MARGIN INK (KITS) ×1 IMPLANT
NDL HYPO 25GX1X1/2 BEV (NEEDLE) IMPLANT
NEEDLE HYPO 25GX1X1/2 BEV (NEEDLE) ×2 IMPLANT
NS IRRIG 1000ML POUR BTL (IV SOLUTION) IMPLANT
PACK GENERAL/GYN (CUSTOM PROCEDURE TRAY) ×2 IMPLANT
SUT MNCRL AB 4-0 PS2 18 (SUTURE) ×2 IMPLANT
SUT VIC AB 2-0 SH 27 (SUTURE) ×2
SUT VIC AB 2-0 SH 27XBRD (SUTURE) IMPLANT
SUT VIC AB 3-0 SH 27 (SUTURE) ×2
SUT VIC AB 3-0 SH 27X BRD (SUTURE) IMPLANT
SYR CONTROL 10ML LL (SYRINGE) ×1 IMPLANT
TOWEL GREEN STERILE FF (TOWEL DISPOSABLE) IMPLANT

## 2022-02-16 NOTE — H&P (Signed)
Chief Complaint: Breast Problem ? ? ?History of Present Illness: ?Amanda [name] is a 71 y.o. female who is seen today as an office consultation at the request of Dr. Dareen Piano for evaluation of Breast Problem ?.  ? ?Patient sent at the request of the breast center for evaluation of discordant mammogram and core biopsy. Patient had a 1 cm mass in the right breast upper outer quadrant. Core biopsy showed fibroadenomatoid change but this was felt to be discordant. She is sent today to discuss lumpectomy. Her mother had breast cancer in her mid 84s. She has no other family history of breast cancer. No history of breast pain, nipple discharge or breast mass on either side. ? ?Review of Systems: ?A complete review of systems was obtained from the patient. I have reviewed this information and discussed as appropriate with the patient. See HPI as well for other ROS. ? ? ? ?Medical History: ?Past Medical History:  ?Diagnosis Date  ? Glaucoma (increased eye pressure)  ? Hyperlipidemia  ? Hypertension  ? ?There is no problem list on file for this patient. ? ?History reviewed. No pertinent surgical history.  ? ?No Known Allergies ? ?Current Outpatient Medications on File Prior to Visit  ?Medication Sig Dispense Refill  ? aspirin 81 MG EC tablet Take by mouth  ? atorvastatin (LIPITOR) 40 MG tablet Take 40 mg by mouth once daily  ? dulaglutide (TRULICITY) 3 WN/4.6 mL subcutaneous pen injector Inject 3 mg as directed once a week.  ? latanoprost (XALATAN) 0.005 % ophthalmic solution 1 drop at bedtime.  ? chlorthalidone 25 MG tablet  ? empagliflozin (JARDIANCE) 10 mg tablet Take 10 mg by mouth once daily  ? metFORMIN (GLUCOPHAGE-XR) 500 MG XR tablet  ? ?No current facility-administered medications on file prior to visit.  ? ?Family History  ?Problem Relation Age of Onset  ? High blood pressure (Hypertension) Mother  ? Hyperlipidemia (Elevated cholesterol) Mother  ? Diabetes Mother  ? Deep vein thrombosis (DVT or abnormal blood clot  formation) Mother  ? Breast cancer Mother  ? ? ?Social History  ? ?Tobacco Use  ?Smoking Status Never  ?Smokeless Tobacco Never  ? ? ?Social History  ? ?Socioeconomic History  ? Marital status: Married  ?Tobacco Use  ? Smoking status: Never  ? Smokeless tobacco: Never  ?Substance and Sexual Activity  ? Alcohol use: Never  ? Drug use: Never  ? ?Objective:  ? ?Vitals:  ?01/03/22 1135  ?BP: (!) 148/78  ?Pulse: 102  ?Temp: 36.8 ?C (98.3 ?F)  ?SpO2: 99%  ?Weight: 60.4 kg (133 lb 3.2 oz)  ?Height: 156.2 cm (5' 1.5")  ? ?Body mass index is 24.76 kg/m?. ? ?Physical Exam ?Constitutional:  ?Appearance: Normal appearance.  ?HENT:  ?Head: Normocephalic and atraumatic.  ?Mouth/Throat:  ?Mouth: Mucous membranes are moist.  ?Eyes:  ?General: No scleral icterus. ?Pupils: Pupils are equal, round, and reactive to light.  ?Cardiovascular:  ?Rate and Rhythm: Normal rate.  ?Pulmonary:  ?Breath sounds: No stridor.  ?Chest:  ?Breasts: ?Right: Normal.  ?Left: Normal.  ?Musculoskeletal:  ?General: Normal range of motion.  ?Lymphadenopathy:  ?Upper Body:  ?Right upper body: No supraclavicular or axillary adenopathy.  ?Left upper body: No supraclavicular or axillary adenopathy.  ?Skin: ?General: Skin is warm.  ?Neurological:  ?General: No focal deficit present.  ?Mental Status: She is alert.  ?Psychiatric:  ?Mood and Affect: Mood normal.  ? ? ? ?Labs, Imaging and Diagnostic Testing: ? 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 ?CLINICAL DATA: 71 year old female presenting with a new lump in the ?upper outer right breast for approximately 3 weeks. Strong family ?history of breast cancer in the patient's mother and sister. ?  ?EXAM: ?DIGITAL DIAGNOSTIC UNILATERAL RIGHT MAMMOGRAM WITH TOMOSYNTHESIS AND ?CAD; ULTRASOUND RIGHT  BREAST LIMITED ?  ?TECHNIQUE: ?Right digital diagnostic mammography and breast tomosynthesis was ?performed. The images were evaluated with computer-aided detection.; ?Targeted ultrasound examination of the right breast was performed ?  ?COMPARISON: Previous exam(s). ?  ?ACR Breast Density Category c: The breast tissue is heterogeneously ?dense, which may obscure small masses. ?  ?FINDINGS: ?Mammogram: ?  ?A skin BB marks the palpable site of concern reported by the patient ?in the upper outer right breast. A spot tangential view of this area ?was performed in addition to standard views. There is an asymmetry ?at the palpable site which effaces on the spot imaging and may ?represent an [place] of normal fibroglandular tissue. ?  ?On physical exam at the site of concern reported by the patient I ?not feel a fixed discrete mass or focal area of thickening. ?  ?Ultrasound: ?  ?Targeted ultrasound performed at the palpable site of concern ?reported by the patient in the right breast at 11 o'clock 7 cm from ?the nipple demonstrating adjacent ill-defined hypoechoic areas ?measuring 2.2 x 1.0 x 1.6 cm and 1.0 x 0.6 x 1.0 cm. These ?correspond to the asymmetry identified mammographically. ?  ?Targeted ultrasound the right axilla demonstrates normal lymph ?nodes. ?  ?IMPRESSION: ?At the palpable site of concern in the right breast at 11 o'clock ?there are adjacent ill-defined hypoechoic areas, possibly islands of ?dense tissue. Given that this area is newly palpable and ?indeterminate recommend tissue sampling. ?  ?RECOMMENDATION: ?Ultrasound-guided core needle biopsy x1 of the larger hypoechoic ?area in the right breast. ?  ?I have discussed the findings and recommendations with the patient ?who agrees to proceed with biopsy. The patient will be scheduled for ?the biopsy appointment prior to leaving the office today. ?  ?BI-RADS CATEGORY 4: Suspicious. ?  ?  ?Electronically Signed ?By: Audie Pinto M.D. ?On: 11/25/2021  12:28 ? ?Assessment and Plan:  ? ?Discordant right breast mass upper outer quadrant. ? ?Recommend right breast lumpectomy with seed localization due to discordance to exclude malignancy. Risks and benefits of surgery discussed. Potential upgrade risk of anywhere from 5 to 8% discussed as well. Observation discussed. She wishes to proceed with right breast seed lumpectomy. ? ?The procedure has been discussed with the patient. Alternatives to surgery have been discussed with the patient. Risks of surgery include bleeding, Infection, Seroma formation, death, and the need for further surgery. The patient understands and wishes to proceed. ? ?No follow-ups on file. ?

## 2022-02-16 NOTE — Op Note (Signed)
Preoperative diagnosis: Right breast fibroadenomatoid lesion ? ?Postoperative diagnosis: Same ? ?Procedure: Right breast seed localized lumpectomy ? ?Surgeon: Erroll Luna, MD ? ?Anesthesia: LMA with 0.25% Marcaine plain ? ?EBL: Minimal ? ?Specimen: Right breast mass with seed and clip verified by Faxitron ? ?Drains: None ? ?IV fluids: Per anesthesia record ? ?Indications for procedure: The patient is a 71 year old female with a right breast mass has been increasing in size.  Biopsy showed fibroadenomatoid lesion but a phylloides tumor cannot be excluded.  She presents for excision.The procedure has been discussed with the patient. Alternatives to surgery have been discussed with the patient.  Risks of surgery include bleeding,  Infection,  Seroma formation, death,  and the need for further surgery.   The patient understands and wishes to proceed.  ? ?Description of procedure: The patient was met in the holding area and questions were answered.  Procedure reviewed.  She wished to proceed.  Films were available for review.  She was taken back to the operative room.  She was placed upon upon the OR table.  After induction of general anesthesia, right breast was prepped and draped in sterile fashion timeout performed.  Proper patient, site and procedure verified.  Neoprobe used to identify the mass.  Incision made over the mass.  Dissection carried down all tissue and the seed and clip were excised with a grossly negative margin.  Hemostasis achieved with cautery.  Local anesthetic infiltrated.  Cavity closed with 3-0 Vicryl.  4 Monocryl used to close skin.  Dermabond applied.  All counts found to be correct.  Breast binder placed.  Patient awoke extubated taken to recovery in satisfactory condition. ?

## 2022-02-16 NOTE — Interval H&P Note (Signed)
History and Physical Interval Note: ? ?02/16/2022 ?8:27 AM ? ?Amanda [name]  has presented today for surgery, with the diagnosis of RIGHT BREAST MASS.  The various methods of treatment have been discussed with the patient and family. After consideration of risks, benefits and other options for treatment, the patient has consented to  Procedure(s): ?RIGHT BREAST LUMPECTOMY WITH RADIOACTIVE SEED LOCALIZATION (Right) as a surgical intervention.  The patient's history has been reviewed, patient examined, no change in status, stable for surgery.  I have reviewed the patient's chart and labs.  Questions were answered to the patient's satisfaction.   ? ? ?Cedricka Sackrider A Jacque Garrels ? ? ?

## 2022-02-16 NOTE — Discharge Instructions (Signed)
[facility] Office Phone Number [phone]  BREAST BIOPSY/ PARTIAL MASTECTOMY: POST OP INSTRUCTIONS  Always review your discharge instruction sheet given to you by the facility where your surgery was performed.  IF YOU HAVE DISABILITY OR FAMILY LEAVE FORMS, YOU MUST BRING THEM TO THE OFFICE FOR PROCESSING.  DO NOT GIVE THEM TO YOUR DOCTOR.  A prescription for pain medication may be given to you upon discharge.  Take your pain medication as prescribed, if needed.  If narcotic pain medicine is not needed, then you may take acetaminophen (Tylenol) or ibuprofen (Advil) as needed. Take your usually prescribed medications unless otherwise directed If you need a refill on your pain medication, please contact your pharmacy.  They will contact our office to request authorization.  Prescriptions will not be filled after 5pm or on week-ends. You should eat very light the first 24 hours after surgery, such as soup, crackers, pudding, etc.  Resume your normal diet the day after surgery. Most patients will experience some swelling and bruising in the breast.  Ice packs and a good support bra will help.  Swelling and bruising can take several days to resolve.  It is common to experience some constipation if taking pain medication after surgery.  Increasing fluid intake and taking a stool softener will usually help or prevent this problem from occurring.  A mild laxative (Milk of Magnesia or Miralax) should be taken according to package directions if there are no bowel movements after 48 hours. Unless discharge instructions indicate otherwise, you may remove your bandages 24-48 hours after surgery, and you may shower at that time.  You may have steri-strips (small skin tapes) in place directly over the incision.  These strips should be left on the skin for 7-10 days.  If your surgeon used skin glue on the incision, you may shower in 24 hours.  The glue will flake off over the next 2-3 weeks.  Any  sutures or staples will be removed at the office during your follow-up visit. ACTIVITIES:  You may resume regular daily activities (gradually increasing) beginning the next day.  Wearing a good support bra or sports bra minimizes pain and swelling.  You may have sexual intercourse when it is comfortable. You may drive when you no longer are taking prescription pain medication, you can comfortably wear a seatbelt, and you can safely maneuver your car and apply brakes. RETURN TO WORK:  ______________________________________________________________________________________ You should see your doctor in the office for a follow-up appointment approximately two weeks after your surgery.  Your doctor's nurse will typically make your follow-up appointment when she calls you with your pathology report.  Expect your pathology report 2-3 business days after your surgery.  You may call to check if you do not hear from us after three days. OTHER INSTRUCTIONS: _______________________________________________________________________________________________ _____________________________________________________________________________________________________________________________________ _____________________________________________________________________________________________________________________________________ _____________________________________________________________________________________________________________________________________  WHEN TO CALL YOUR DOCTOR: Fever over 101.0 Nausea and/or vomiting. Extreme swelling or bruising. Continued bleeding from incision. Increased pain, redness, or drainage from the incision.  The clinic staff is available to answer your questions during regular business hours.  Please don't hesitate to call and ask to speak to one of the nurses for clinical concerns.  If you have a medical emergency, go to the nearest emergency room or call 911.  A surgeon from [facility] is always on call at the hospital.  For further questions, please visit [email or web address]   

## 2022-02-16 NOTE — Transfer of Care (Signed)
Immediate Anesthesia Transfer of Care Note ? ?Patient: Amanda [name] ? ?Procedure(s) Performed: RIGHT BREAST LUMPECTOMY WITH RADIOACTIVE SEED LOCALIZATION (Right: Breast) ? ?Patient Location: PACU ? ?Anesthesia Type:General ? ?Level of Consciousness: awake ? ?Airway & Oxygen Therapy: Patient Spontanous Breathing and Patient connected to face mask oxygen ? ?Post-op Assessment: Report given to RN and Post -op Vital signs reviewed and stable ? ?Post vital signs: Reviewed and stable ? ?Last Vitals:  ?Vitals Value Taken Time  ?BP 117/61 02/16/22 0932  ?Temp 36.2 ?C 02/16/22 0931  ?Pulse 78 02/16/22 0936  ?Resp 11 02/16/22 0936  ?SpO2 98 % 02/16/22 0936  ?Vitals shown include unvalidated device data. ? ?Last Pain:  ?Vitals:  ? 02/16/22 0658  ?TempSrc:   ?PainSc: 0-No pain  ?   ? ?Patients Stated Pain Goal: 0 (02/16/22 2637) ? ?Complications: No notable events documented. ?

## 2022-02-16 NOTE — Anesthesia Procedure Notes (Signed)
Procedure Name: LMA Insertion ?Date/Time: 02/16/2022 8:47 AM ?Performed by: Theresa Mulligan, RN ?Pre-anesthesia Checklist: Patient identified, Emergency Drugs available, Suction available and Patient being monitored ?Patient Re-evaluated:Patient Re-evaluated prior to induction ?Oxygen Delivery Method: Circle System Utilized ?Preoxygenation: Pre-oxygenation with 100% oxygen ?Induction Type: IV induction ?Ventilation: Mask ventilation without difficulty ?LMA: LMA inserted ?LMA Size: 4.0 ?Number of attempts: 1 ?Airway Equipment and Method: Bite block ?Placement Confirmation: positive ETCO2 ?Tube secured with: Tape ?Dental Injury: Teeth and Oropharynx as per pre-operative assessment  ? ? ? ? ?

## 2022-02-17 ENCOUNTER — Other Ambulatory Visit: Payer: Self-pay | Admitting: Internal Medicine

## 2022-02-17 ENCOUNTER — Other Ambulatory Visit (HOSPITAL_COMMUNITY): Payer: Self-pay

## 2022-02-17 ENCOUNTER — Encounter (HOSPITAL_COMMUNITY): Payer: Self-pay | Admitting: Surgery

## 2022-02-17 DIAGNOSIS — I10 Essential (primary) hypertension: Secondary | ICD-10-CM

## 2022-02-17 DIAGNOSIS — E118 Type 2 diabetes mellitus with unspecified complications: Secondary | ICD-10-CM

## 2022-02-18 ENCOUNTER — Other Ambulatory Visit (HOSPITAL_COMMUNITY): Payer: Self-pay

## 2022-02-18 ENCOUNTER — Other Ambulatory Visit: Payer: Self-pay | Admitting: Internal Medicine

## 2022-02-18 DIAGNOSIS — E1142 Type 2 diabetes mellitus with diabetic polyneuropathy: Secondary | ICD-10-CM

## 2022-02-18 LAB — SURGICAL PATHOLOGY

## 2022-02-18 MED ORDER — EMPAGLIFLOZIN 10 MG PO TABS
10.0000 mg | ORAL_TABLET | Freq: Every day | ORAL | 2 refills | Status: DC
  Filled 2022-02-18 – 2022-03-03 (×2): qty 30, 30d supply, fill #0
  Filled 2022-04-05 – 2022-08-17 (×5): qty 30, 30d supply, fill #1

## 2022-02-18 MED ORDER — GLUCOSE BLOOD VI STRP
ORAL_STRIP | 12 refills | Status: DC
  Filled 2022-02-18: qty 100, 33d supply, fill #0

## 2022-02-18 MED ORDER — GLUCOSE BLOOD VI STRP
ORAL_STRIP | 12 refills | Status: DC
  Filled 2022-02-18: qty 100, 33d supply, fill #0
  Filled 2022-03-03: qty 100, 90d supply, fill #0
  Filled 2022-08-05: qty 100, 90d supply, fill #1

## 2022-02-18 NOTE — Progress Notes (Signed)
Patient has not been able to apply for program to help with cost of jardiance. I contacted Ms. Hortencia Pilar and she will reach out to patient to help with next step questions, greatly appreciate her assistance. She also requested accu-check strips. She is not taking insulin, but states she likes being able to check her blood sugar to know how she is doing. ?P: ?-21 tabs of Jardiance 10 mg signed out of samples closet and set aside for patient. Patient states she has enough medication to get her to Monday and she will stop by clinic then. ?-refilled accu-check glucose strips ?

## 2022-02-18 NOTE — Addendum Note (Signed)
Addended by: Edwyna Perfect on: 02/18/2022 11:27 AM ? ? Modules accepted: Orders ? ?

## 2022-02-19 NOTE — Anesthesia Postprocedure Evaluation (Signed)
Anesthesia Post Note ? ?Patient: Amanda [name] ? ?Procedure(s) Performed: RIGHT BREAST LUMPECTOMY WITH RADIOACTIVE SEED LOCALIZATION (Right: Breast) ? ?  ? ?Patient location during evaluation: PACU ?Anesthesia Type: General ?Level of consciousness: awake and alert ?Pain management: pain level controlled ?Vital Signs Assessment: post-procedure vital signs reviewed and stable ?Respiratory status: spontaneous breathing, nonlabored ventilation, respiratory function stable and patient connected to nasal cannula oxygen ?Cardiovascular status: blood pressure returned to baseline and stable ?Postop Assessment: no apparent nausea or vomiting ?Anesthetic complications: no ? ? ?No notable events documented. ? ?Last Vitals:  ?Vitals:  ? 02/16/22 0947 02/16/22 0954  ?BP: 115/65 135/67  ?Pulse: 78 76  ?Resp: 19 12  ?Temp:  (!) 36.4 ?C  ?SpO2: 100% 100%  ?  ?Last Pain:  ?Vitals:  ? 02/16/22 0954  ?TempSrc:   ?PainSc: 0-No pain  ? ? ?  ?  ?  ?  ?  ?  ? ?Tsuruko Murtha ? ? ? ? ?

## 2022-02-21 ENCOUNTER — Telehealth: Payer: Self-pay

## 2022-02-21 NOTE — Telephone Encounter (Signed)
Left message regarding follow up on medication assistance.  ? ?Gave call back # [phone] ?

## 2022-02-22 ENCOUNTER — Telehealth: Payer: Self-pay | Admitting: Internal Medicine

## 2022-02-22 ENCOUNTER — Other Ambulatory Visit (HOSPITAL_COMMUNITY): Payer: Self-pay

## 2022-02-22 NOTE — Telephone Encounter (Signed)
Unable to reach patient on cell phone or home phone to review results of surg pathology from recent lumpectomy. This was negative for carcinoma. Will call patient again at later date. ?

## 2022-02-23 ENCOUNTER — Telehealth: Payer: Self-pay | Admitting: Internal Medicine

## 2022-02-23 DIAGNOSIS — M8580 Other specified disorders of bone density and structure, unspecified site: Secondary | ICD-10-CM

## 2022-02-23 NOTE — Telephone Encounter (Signed)
Called and verified patient by name and DOB. We reviewed pathology results from resent lumpectomy that were negative for cancer.  ?

## 2022-02-24 ENCOUNTER — Other Ambulatory Visit (HOSPITAL_COMMUNITY): Payer: Self-pay

## 2022-02-24 ENCOUNTER — Encounter: Payer: Medicare Other | Admitting: Internal Medicine

## 2022-02-28 ENCOUNTER — Telehealth: Payer: Self-pay

## 2022-02-28 ENCOUNTER — Ambulatory Visit
Admission: RE | Admit: 2022-02-28 | Discharge: 2022-02-28 | Disposition: A | Discharge status: Home / Self Care | Payer: Medicare Other | Source: Ambulatory Visit | Attending: Internal Medicine | Admitting: Internal Medicine

## 2022-02-28 DIAGNOSIS — Z13820 Encounter for screening for osteoporosis: Secondary | ICD-10-CM

## 2022-02-28 NOTE — Telephone Encounter (Signed)
Lvm for pt to call back. Pt lvm over the weekend requesting a call.

## 2022-03-03 ENCOUNTER — Other Ambulatory Visit (HOSPITAL_COMMUNITY): Payer: Self-pay

## 2022-03-03 ENCOUNTER — Encounter (HOSPITAL_COMMUNITY): Payer: Self-pay

## 2022-03-04 ENCOUNTER — Encounter: Payer: Self-pay | Admitting: Internal Medicine

## 2022-03-04 MED ORDER — CALCIUM GLUCONATE 500 MG PO TABS: 1.0000 | ORAL_TABLET | Freq: Every day | ORAL | Status: DC

## 2022-03-04 NOTE — Addendum Note (Signed)
Addended by: Edwyna Perfect on: 03/04/2022 12:12 PM ? ? Modules accepted: Orders ? ?

## 2022-03-04 NOTE — Assessment & Plan Note (Addendum)
DEXa scan showed osteopenia. Unable to talk with patient about results. Letter was sent with instructions to start taking vitamin D and calicum supplement. With history of CKD, GFR is close to < 30 at 34. Will recommend patient take 500 mg calcium. ?

## 2022-03-08 ENCOUNTER — Other Ambulatory Visit (HOSPITAL_COMMUNITY): Payer: Self-pay

## 2022-03-09 ENCOUNTER — Other Ambulatory Visit (HOSPITAL_COMMUNITY): Payer: Self-pay

## 2022-03-10 NOTE — Telephone Encounter (Signed)
Called and spoke with patient. She has taken vitamin D and calcium supplementation in the past. She has a history of CKD stage 3b. Last Ca on BMP at 9.4. Patient to be seen in clinic next week. Discussed with patient need to increase calcium intake in diet and will mail results with print out of high calcium foods. Will hold off on supplementation for now and check vitamin D at appointment next week. ?

## 2022-03-10 NOTE — Progress Notes (Signed)
Follow-up 3/29 ?

## 2022-03-16 ENCOUNTER — Ambulatory Visit (INDEPENDENT_AMBULATORY_CARE_PROVIDER_SITE_OTHER): Payer: Medicare Other | Admitting: Internal Medicine

## 2022-03-16 ENCOUNTER — Encounter: Payer: Self-pay | Admitting: Internal Medicine

## 2022-03-16 VITALS — BP 113/70 | HR 85 | Temp 97.8°F | Wt 125.8 lb

## 2022-03-16 DIAGNOSIS — Z794 Long term (current) use of insulin: Secondary | ICD-10-CM

## 2022-03-16 DIAGNOSIS — E1142 Type 2 diabetes mellitus with diabetic polyneuropathy: Secondary | ICD-10-CM

## 2022-03-16 DIAGNOSIS — I10 Essential (primary) hypertension: Secondary | ICD-10-CM

## 2022-03-16 NOTE — Progress Notes (Signed)
? ? ?Subjective:  ?CC: follow-up on blood pressure and diabetes ? ?HPI: ? ?[name] is a 71 y.o. female with a past medical history CKD stage 3, DM, HTN, iron deficiency anemia who presents today for follow-up on blood pressure and diabetes. Please see problem based assessment and plan for additional details. ? ?Past Medical History:  ?Diagnosis Date  ? Allergy   ? SEASONAL  ? Anemia   ? Arthritis   ? 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 Cataract   ? PRESENT IN LEFT/ CATARACTS REMOVED FROM RIGHT  ? Cervical radiculopathy due to degenerative joint disease of spine 05/03/2017  ? CKD (chronic kidney disease) stage 3, GFR 30-59 ml/min (HCC) 01/01/2021  ? Diabetes mellitus 1996  ? DKA (diabetic ketoacidoses) 01/02/2013  ? Family history of adverse reaction to anesthesia 01/02/2013  ? Mother was slow to wake up after a procedure  ? Gait abnormality 01/10/2019  ? Glaucoma   ? Hyperlipidemia   ? Hypertension   ? Hypotension   ? Influenza A (H1N1) 01/03/2013  ? Nail fungus 10/23/2017  ? Need for shingles vaccine 10/28/2021  ? Neuromuscular disorder ([place])   ? NEUROPATHY  ? Normocytic anemia 02/14/2013  ? Osteopenia 05/04/2016  ? Screening for osteoporosis 10/28/2021  ? Sinusitis 04/29/2021  ? SYNCOPE 05/10/2010  ? Qualifier: Diagnosis of  By: Marilynne Halsted RN, BSN, [place]    ? Viral upper respiratory infection 12/23/2021  ? ? ?Current Outpatient Medications on File Prior to Visit  ?Medication Sig Dispense Refill  ? acetaminophen (TYLENOL) 325 MG tablet Take 2 tablets (650 mg total) by mouth every 6 (six) hours as needed (or Fever >/= 101). 60 tablet 0  ? atorvastatin (LIPITOR) 40 MG tablet Take 1 tablet (40 mg total) by mouth daily. 90 tablet 1  ? blood glucose meter kit and supplies KIT Dispense based on patient and insurance preference. Use up to four times daily as directed. . 1 each 0  ? cetirizine (ZYRTEC) 5 MG tablet Take 1 tablet (5 mg total) by mouth daily. (Patient taking differently: Take 5 mg by mouth  daily as needed.) 30 tablet 1  ? diclofenac Sodium (VOLTAREN) 1 % GEL Apply 2 g topically daily as needed (pain).    ? Dulaglutide (TRULICITY) 4.5 XE/9.4MH SOPN Inject 4.5 mg as directed once a week. (Patient taking differently: Inject 4.5 mg as directed once a week. Thursday) 2 mL 2  ? empagliflozin (JARDIANCE) 10 MG TABS tablet Take 1 tablet (10 mg total) by mouth daily before breakfast. 90 tablet 2  ? fluticasone (FLONASE) 50 MCG/ACT nasal spray Place 1 spray into both nostrils daily. 16 g 2  ? glucose blood test strip You do not medically need to check blood sugar while not taking insulin. You can check your blood sugar if you are concerned when you want, but do not need to 100 each 12  ? latanoprost (XALATAN) 0.005 % ophthalmic solution Place 1 drop into both eyes at bedtime.    ? metFORMIN (GLUCOPHAGE-XR) 500 MG 24 hr tablet Take 1 tablet (500 mg total) by mouth 2 (two) times daily with a meal. 90 tablet 3  ? Multiple Vitamins-Minerals (WOMENS 50+ MULTI VITAMIN/MIN) TABS Take 1 tablet by mouth daily. 30 tablet   ? oxyCODONE (OXY IR/ROXICODONE) 5 MG immediate release tablet Take 1 tablet (5 mg total) by mouth every 6 (six) hours as needed for severe pain. 15 tablet 0  ? [DISCONTINUED] glipiZIDE (GLUCOTROL) 5 MG tablet Take 0.5 tablets (2.5 mg  total) by mouth daily before breakfast. 15 tablet 0  ? ?No current facility-administered medications on file prior to visit.  ? ? ?Family History  ?Problem Relation Age of Onset  ? Diabetes Mother   ? Hypertension Mother   ? Breast cancer Mother   ? Cancer Mother   ? Diabetes Sister   ? Hypertension Sister   ? Kidney disease Sister   ?     One Kidney transplant, one sister on HD  ? Breast cancer Sister   ? Cancer Sister   ? Hypertension Sister   ? Diabetes Sister   ? Cerebral palsy Brother   ? Cancer Brother   ?     Unknown  ? Hypertension Daughter   ? Colon cancer Neg Hx   ? Esophageal cancer Neg Hx   ? Stomach cancer Neg Hx   ? Rectal cancer Neg Hx   ? ? ?Social History   ? ?Socioeconomic History  ? Marital status: Married  ?  Spouse name: Not on file  ? Number of children: 1  ? Years of education: Not on file  ? Highest education level: Not on file  ?Occupational History  ? Occupation: Retired  ?  Comment: [employer]  ?Tobacco Use  ? Smoking status: Never  ? Smokeless tobacco: Never  ?Vaping Use  ? Vaping Use: Never used  ?Substance and Sexual Activity  ? Alcohol use: No  ?  Alcohol/week: 0.0 standard drinks  ? Drug use: No  ? Sexual activity: Not Currently  ?  Birth control/protection: Surgical  ?Other Topics Concern  ? Not on file  ?Social History Narrative  ? Current Social History 04/23/2021    ?   ? Patient lives with husband in a one level home with ramp  ?   ? Patient's method of transportation is personal car shared with her mother.  ?   ? The highest level of education was high school diploma.  ?   ? The patient currently retired from [employer].  ? ?Social Determinants of Health  ? ?Financial Resource Strain: Not on file  ?Food Insecurity: Not on file  ?Transportation Needs: Not on file  ?Physical Activity: Not on file  ?Stress: Not on file  ?Social Connections: Not on file  ?Intimate Partner Violence: Not on file  ? ? ?Review of Systems: ?ROS negative except for what is noted on the assessment and plan. ? ?Objective:  ? ?Vitals:  ? 03/16/22 1412 03/16/22 1501  ?BP: (!) 146/80 113/70  ?Pulse: 90 85  ?Temp: 97.8 ?F (36.6 ?C)   ?TempSrc: Oral   ?SpO2: 99%   ?Weight: 125 lb 12.8 oz (57.1 kg)   ? ? ?Physical Exam: ?Gen: A&O x3 and in no apparent distress, well appearing and nourished. ?HEENT:  ?  Head - normocephalic, atraumatic.  ?  Eye - visual acuity grossly intact, conjunctiva clear, sclera non-icteric, EOM intact.  ?  Mouth - No obvious caries or periodontal disease. ?Neck: no masses or nodules, AROM intact. ?CV: RRR, no murmurs, S1/S2 presents  ?Resp: Clear to ascultation bilaterally  ?Abd: BS (+) x4, soft, non-tender abdomen, without hepatosplenomegaly or  masses ?MSK: Grossly normal AROM and strength x4 extremities. ?Skin: good skin turgor, no rashes, unusual bruising, or prominent lesions.  ?Neuro: No focal deficits, grossly normal sensation and coordination.  ?Psych: Oriented x3 and responding appropriately. Intact memory, normal mood, judgement, affect, and insight.  ? ? ?Assessment & Plan:  ?DM II (diabetes mellitus, type II), controlled ([place]) ?  Lab Results  ?Component Value Date  ? HGBA1C 8.4 (A) 02/03/2022  ?Last A1c increased from 7.4 to 8.4.  ?Medications: Metformin 500 mg BID (VOP<92), Trulicity increased to 4.5 mg weekly at last visit, Jardiance 10 mg qd ?Ms. Schaad checks her blood sugar once daily in the morning before breakfast. She likes to do this as it makes her feel more in control of her diabetes. Blood sugars range from 120-160 with avg around 130. She does not have any low blood sugars. ?A: ?Patient has lost about 20 lbs since starting GLP-1. BMI is at healthy range at 23.38. She states that she tries to eat a diet with lots of fruits and veggies.  ?P: ?Follow-up in May for repeat HgbA1c ?Foot exam completed ?Ophthalmology referral ? ?Essential hypertension ?Ms. Marich presents with history of hypertension c/p CKD stage 3b. Her blood pressure was initially elevated at 146/80. She states that at home her blood pressure ranges between 120-150/80. Repeat BP at 113/70. Patient had all medications with her today and when reviewing them together she realized that she has been out of her blood pressure medication for an unknown amount of time.   ? ?Microal/creatinine ratio at 77, has been >1000 several times in past. She had chronic cough with lisiopril and hyperkalemia with losartan. From last nephrology note 01/23 plan to decrease Chlorthalidone and start low dose ARB.  ?A: ?She has not been taking Chlorthalidone for unknown amount of time. From her description of home blood pressure readings, her BP is well-controlled of of medications. She has lost  about 20 lbs in the last year on GLP-1 and likely that weight loss helped with BP control. ?P: ?Chlorthalidone removed from medication list. She has follow-up with nephrology soon. Asked patient to take bp log to neph

## 2022-03-16 NOTE — Patient Instructions (Addendum)
[name], it was a pleasure seeing you today! ? ?Today we discussed: ?Diabetes ?Your Blood sugar checks looked good! You do not have to continue monitoring that since you are not on insulin, but it doesn't hurt you.  Continue taking Metformin 500 mg 1 time in morning and 1 time in evening. Take Jardiance 1 time daily. Continue Trulicity 4.5 mg weekly. We will keep a close eye on your weight, but this seems to have helped your blood pressure! ? ?Blood pressure ?Your repeat blood pressure was 113/70. Please follow-up with the kidney doctors. They may still want you to be on a low dose blood pressure medication to help your kidneys. ? ?Bones ?Be sure to eat a balanced diet with fruit and vegetables and this should get you the proper nutrition for your bones. ? ?Shingles- please go get 2nd dose of series ? ?I have ordered the following labs today: ? ?Lab Orders  ?No laboratory test(s) ordered today  ?  ? ?I will call if any are abnormal. All of your labs can be accessed through "My Chart" ?  ?My Chart Access: ?[email or web address] ? ? ?Referrals ordered today:  ? ?Referral Orders    ?     Ambulatory referral to Ophthalmology    ?  ? ?I have ordered the following medication/changed the following medications:  ? ?Stop the following medications: ?Medications Discontinued During This Encounter  ?Medication Reason  ? chlorthalidone (HYGROTON) 25 MG tablet Change in therapy  ? calcium gluconate 500 MG tablet Completed Course  ? Cholecalciferol (VITAMIN D3) 2000 units capsule Completed Course  ? ibuprofen (ADVIL) 800 MG tablet Completed Course  ? aspirin EC 81 MG tablet Change in therapy  ?  ? ?Start the following medications: ?No orders of the defined types were placed in this encounter. ?  ? ?Follow-up: 2 months to repeat blood work for diabetes ? ?Please make sure to arrive 15 minutes prior to your next appointment. If you arrive late, you may be asked to reschedule.  ? ?We  look forward to seeing you next time. Please call our clinic at [phone] if you have any questions or concerns. The best time to call is Monday-Friday from 9am-4pm, but there is someone available 24/7. If after hours or the weekend, call the main hospital number and ask for the Internal Medicine Resident On-Call. If you need medication refills, please notify your pharmacy one week in advance and they will send [place] a request. ? ?Thank you for letting us take part in your care. Wishing you the best! ? ?Thank you, ?Dr. Howie Ill ?[place]  ?

## 2022-03-17 NOTE — Assessment & Plan Note (Signed)
Ms. Schmader presents with history of hypertension c/p CKD stage 3b. Her blood pressure was initially elevated at 146/80. She states that at home her blood pressure ranges between 120-150/80. Repeat BP at 113/70. Patient had all medications with her today and when reviewing them together she realized that she has been out of her blood pressure medication for an unknown amount of time.   ? ?Microal/creatinine ratio at 77, has been >1000 several times in past. She had chronic cough with lisiopril and hyperkalemia with losartan. From last nephrology note 01/23 plan to decrease Chlorthalidone and start low dose ARB.  ?A: ?She has not been taking Chlorthalidone for unknown amount of time. From her description of home blood pressure readings, her BP is well-controlled of of medications. She has lost about 20 lbs in the last year on GLP-1 and likely that weight loss helped with BP control. ?P: ?Chlorthalidone removed from medication list. She has follow-up with nephrology soon. Asked patient to take bp log to nephrology appointment to review with them. Will hold off on starting low dose ARB at this time. ?

## 2022-03-17 NOTE — Assessment & Plan Note (Addendum)
Lab Results  ?Component Value Date  ? HGBA1C 8.4 (A) 02/03/2022  ?Last A1c increased from 7.4 to 8.4.  ?Medications: Metformin 500 mg BID (KBT<24), Trulicity increased to 4.5 mg weekly at last visit, Jardiance 10 mg qd ?Ms. Markson checks her blood sugar once daily in the morning before breakfast. She likes to do this as it makes her feel more in control of her diabetes. Blood sugars range from 120-160 with avg around 130. She does not have any low blood sugars. ?A: ?Patient has lost about 20 lbs since starting GLP-1. BMI is at healthy range at 23.38. She states that she tries to eat a diet with lots of fruits and veggies.  ?P: ?Follow-up in May for repeat HgbA1c ?Foot exam completed ?Ophthalmology referral ?

## 2022-03-17 NOTE — Progress Notes (Signed)
Internal Medicine Clinic Attending  Case discussed with Dr. [name]  At the time of the visit.  We reviewed the resident's history and exam and pertinent patient test results.  I agree with the assessment, diagnosis, and plan of care documented in the resident's note.  

## 2022-03-18 ENCOUNTER — Other Ambulatory Visit (HOSPITAL_COMMUNITY): Payer: Self-pay

## 2022-03-21 ENCOUNTER — Other Ambulatory Visit (HOSPITAL_COMMUNITY): Payer: Self-pay

## 2022-03-23 ENCOUNTER — Telehealth: Payer: Self-pay

## 2022-03-23 NOTE — Telephone Encounter (Signed)
Pt is requesting a call back .. she stated that her PCP called her with her lab results but she missed the call  ?

## 2022-03-24 ENCOUNTER — Other Ambulatory Visit: Payer: Self-pay | Admitting: Internal Medicine

## 2022-03-24 ENCOUNTER — Other Ambulatory Visit (HOSPITAL_COMMUNITY): Payer: Self-pay

## 2022-03-24 DIAGNOSIS — E785 Hyperlipidemia, unspecified: Secondary | ICD-10-CM

## 2022-03-25 ENCOUNTER — Other Ambulatory Visit (HOSPITAL_COMMUNITY): Payer: Self-pay

## 2022-03-25 MED ORDER — ATORVASTATIN CALCIUM 40 MG PO TABS
40.0000 mg | ORAL_TABLET | Freq: Every day | ORAL | 2 refills | Status: DC
  Filled 2022-03-25: qty 90, 90d supply, fill #0
  Filled 2022-06-27: qty 90, 90d supply, fill #1
  Filled 2022-11-02: qty 90, 90d supply, fill #2

## 2022-03-28 ENCOUNTER — Other Ambulatory Visit (HOSPITAL_COMMUNITY): Payer: Self-pay

## 2022-04-05 ENCOUNTER — Other Ambulatory Visit (HOSPITAL_COMMUNITY): Payer: Self-pay

## 2022-04-06 ENCOUNTER — Other Ambulatory Visit (HOSPITAL_COMMUNITY): Payer: Self-pay

## 2022-04-06 LAB — HM DIABETES EYE EXAM

## 2022-04-11 ENCOUNTER — Other Ambulatory Visit: Payer: Medicare Other

## 2022-04-12 ENCOUNTER — Encounter: Payer: Self-pay | Admitting: Internal Medicine

## 2022-04-12 ENCOUNTER — Other Ambulatory Visit: Payer: Self-pay

## 2022-04-12 ENCOUNTER — Ambulatory Visit (INDEPENDENT_AMBULATORY_CARE_PROVIDER_SITE_OTHER): Payer: Medicare Other | Admitting: Internal Medicine

## 2022-04-12 DIAGNOSIS — I129 Hypertensive chronic kidney disease with stage 1 through stage 4 chronic kidney disease, or unspecified chronic kidney disease: Secondary | ICD-10-CM

## 2022-04-12 DIAGNOSIS — E1151 Type 2 diabetes mellitus with diabetic peripheral angiopathy without gangrene: Secondary | ICD-10-CM | POA: Diagnosis not present

## 2022-04-12 DIAGNOSIS — E1122 Type 2 diabetes mellitus with diabetic chronic kidney disease: Secondary | ICD-10-CM

## 2022-04-12 DIAGNOSIS — R519 Headache, unspecified: Secondary | ICD-10-CM | POA: Insufficient documentation

## 2022-04-12 DIAGNOSIS — N183 Chronic kidney disease, stage 3 unspecified: Secondary | ICD-10-CM

## 2022-04-12 NOTE — Assessment & Plan Note (Signed)
Patient presented to the [facility] today, as she has been having intermittent headaches for the past 3-4 days. She states that the headache feels like a stabbing pain in the right frontal aspect of her head and only lasts for a few seconds-minutes at a time, but occurs about twice an hour. The patient states that she did accidentally hit the right side of her head against the wall about two days prior to the development of her headaches. Aside from her headaches, she denies any dizziness, lightheadedness, vision changes, numbness/tingling, LOC, falls, n/v, confusion/brain fog, agitation, photophobia, phonophobia, or drowsiness/sleepiness. She has been taking Tylenol 500 mg once to twice a day for her headaches, and feels that it has not been helping too much. The patient cannot take NSAIDs due to her CKD.  ? ?Patient specifically asked about getting a head CT today, however, given that she did not have a fall causing her to hit her head, and that she is having no other neurologic signs/symptoms, will hold off on CT head at this time.  ? ?Plan: ?- Advised patient to take Tylenol 1000 mg bid for her headaches ?- If Tylenol ineffective, patient is advised to take Excedrin ?- Advised patient to go to ED if she develops any confusion, nausea/vomiting, agitation, or vision changes along with her headaches ?

## 2022-04-12 NOTE — Progress Notes (Signed)
Internal Medicine Clinic Attending ° °Case discussed with Dr. Atway  At the time of the visit.  We reviewed the resident’s history and exam and pertinent patient test results.  I agree with the assessment, diagnosis, and plan of care documented in the resident’s note.  °

## 2022-04-12 NOTE — Patient Instructions (Signed)
Thank you, Ms.Nahima R Engert for allowing [place] to provide your care today. Today we discussed: ? ?Headache: You can take 2 tylenol tablets (total of 1000 mg) at a time- you can keep doing this twice a day for your pain. Additionally, if that does not help, you can start taking excedrin instead (up to twice a day) to see if this helps your pain more.  ?If you develop nausea, vomiting, blurry vision, confusion, or agitation along with your headache, please go to the emergency room ? ?I have ordered the following labs for you: ? ?Lab Orders  ?No laboratory test(s) ordered today  ?  ? ?Referrals ordered today:  ? ?Referral Orders  ?No referral(s) requested today  ?  ? ?I have ordered the following medication/changed the following medications:  ? ?Stop the following medications: ?There are no discontinued medications.  ? ?Start the following medications: ?No orders of the defined types were placed in this encounter. ?  ? ?Follow up:  1 month- previously scheduled appt   ? ? ?Remember: If you develop nausea, vomiting, blurry vision, confusion, or agitation, please go to the emergency room ? ?Should you have any questions or concerns please call the internal medicine clinic at [phone].   ? ? ?Buddy Duty, D.O. ?[place] ? ? ?

## 2022-04-12 NOTE — Progress Notes (Signed)
? ?CC: headache ? ?HPI: ? ?Amanda [name] is a 71 y.o. female with hypertension, PAD, diabetes, and CKD 3 who presents to the [facility] for a headache. Please see problem-based list for further details, assessments, and plans. ? ? ?Past Medical History:  ?Diagnosis Date  ? Allergy   ? SEASONAL  ? Anemia   ? Arthritis   ? 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 Cataract   ? PRESENT IN LEFT/ CATARACTS REMOVED FROM RIGHT  ? Cervical radiculopathy due to degenerative joint disease of spine 05/03/2017  ? CKD (chronic kidney disease) stage 3, GFR 30-59 ml/min (HCC) 01/01/2021  ? Diabetes mellitus 1996  ? DKA (diabetic ketoacidoses) 01/02/2013  ? Family history of adverse reaction to anesthesia 01/02/2013  ? Mother was slow to wake up after a procedure  ? Gait abnormality 01/10/2019  ? Glaucoma   ? Hyperlipidemia   ? Hypertension   ? Hypotension   ? Influenza A (H1N1) 01/03/2013  ? Nail fungus 10/23/2017  ? Need for shingles vaccine 10/28/2021  ? Neuromuscular disorder ([place])   ? NEUROPATHY  ? Normocytic anemia 02/14/2013  ? Osteopenia 05/04/2016  ? Screening for osteoporosis 10/28/2021  ? Sinusitis 04/29/2021  ? SYNCOPE 05/10/2010  ? Qualifier: Diagnosis of  By: Marilynne Halsted RN, BSN, [place]    ? Viral upper respiratory infection 12/23/2021  ? ?Review of Systems:  Review of Systems  ?Constitutional:  Negative for chills and fever.  ?HENT: Negative.    ?Eyes:  Negative for blurred vision and double vision.  ?Respiratory: Negative.    ?Cardiovascular:  Negative for chest pain and palpitations.  ?Musculoskeletal: Negative.   ?Neurological:  Positive for headaches. Negative for dizziness, tingling, loss of consciousness and weakness.  ? ?Physical Exam: ? ?Vitals:  ? 04/12/22 0950 04/12/22 1028  ?BP: (!) 149/85 (!) 146/76  ?Pulse: 85 82  ?Temp: 98.1 ?F (36.7 ?C)   ?TempSrc: Oral   ?SpO2: 100%   ?Weight: 125 lb 6.4 oz (56.9 kg)   ?Height: '5\' 2"'$  (1.575 m)   ? ?General: Pleasant, well-appearing female. No acute distress. ?CV: RRR. No  murmurs, rubs, or gallops.  ?Pulmonary: Lungs CTAB. Normal effort. No wheezing or rales. ?Extremities: Normal bulk and tone. Normal ROM. ?Skin: Warm and dry. No obvious rash or lesions. ?Neuro: A&Ox3. Moves all extremities with 5/5 strength in bilateral upper and lower extremities. CN II-XII intact. Normal sensation. No focal deficit. ?Psych: Normal mood and affect ? ? ?Assessment & Plan:  ? ?See Encounters Tab for problem based charting. ? ?Patient discussed with Dr.  Cain Sieve ? ?Headache ?Patient presented to the [facility] today, as she has been having intermittent headaches for the past 3-4 days. She states that the headache feels like a stabbing pain in the right frontal aspect of her head and only lasts for a few seconds-minutes at a time, but occurs about twice an hour. The patient states that she did accidentally hit the right side of her head against the wall about two days prior to the development of her headaches. Aside from her headaches, she denies any dizziness, lightheadedness, vision changes, numbness/tingling, LOC, falls, n/v, confusion/brain fog, agitation, photophobia, phonophobia, or drowsiness/sleepiness. She has been taking Tylenol 500 mg once to twice a day for her headaches, and feels that it has not been helping too much. The patient cannot take NSAIDs due to her CKD.  ? ?Patient specifically asked about getting a head CT today, however, given that she did not have a fall causing her to hit her  head, and that she is having no other neurologic signs/symptoms, will hold off on CT head at this time.  ? ?Plan: ?- Advised patient to take Tylenol 1000 mg bid for her headaches ?- If Tylenol ineffective, patient is advised to take Excedrin ?- Advised patient to go to ED if she develops any confusion, nausea/vomiting, agitation, or vision changes along with her headaches ? ? ? ?

## 2022-04-14 ENCOUNTER — Other Ambulatory Visit (HOSPITAL_COMMUNITY): Payer: Self-pay

## 2022-04-29 ENCOUNTER — Other Ambulatory Visit (HOSPITAL_COMMUNITY): Payer: Self-pay

## 2022-04-29 ENCOUNTER — Other Ambulatory Visit: Payer: Self-pay | Admitting: Student

## 2022-05-02 ENCOUNTER — Other Ambulatory Visit (HOSPITAL_COMMUNITY): Payer: Self-pay

## 2022-05-02 MED ORDER — TRULICITY 4.5 MG/0.5ML ~~LOC~~ SOAJ
4.5000 mg | SUBCUTANEOUS | 2 refills | Status: DC
  Filled 2022-05-02: qty 2, 28d supply, fill #0

## 2022-05-03 ENCOUNTER — Other Ambulatory Visit (HOSPITAL_COMMUNITY): Payer: Self-pay

## 2022-05-06 ENCOUNTER — Other Ambulatory Visit (HOSPITAL_COMMUNITY): Payer: Self-pay

## 2022-05-11 ENCOUNTER — Other Ambulatory Visit (HOSPITAL_COMMUNITY): Payer: Self-pay

## 2022-05-11 ENCOUNTER — Ambulatory Visit: Payer: Medicare Other | Admitting: Podiatry

## 2022-05-11 ENCOUNTER — Encounter: Payer: Self-pay | Admitting: Podiatry

## 2022-05-11 DIAGNOSIS — L84 Corns and callosities: Secondary | ICD-10-CM

## 2022-05-11 DIAGNOSIS — M79605 Pain in left leg: Secondary | ICD-10-CM

## 2022-05-11 DIAGNOSIS — E1142 Type 2 diabetes mellitus with diabetic polyneuropathy: Secondary | ICD-10-CM

## 2022-05-11 DIAGNOSIS — M79604 Pain in right leg: Secondary | ICD-10-CM | POA: Diagnosis not present

## 2022-05-16 NOTE — Progress Notes (Signed)
  Subjective:  Patient ID: Amanda [name], female    DOB: [birth year],  MRN: [id number]  Amanda [name] presents to clinic today for at risk foot care with history of diabetic neuropathy and corn(s) R 5th toe and painful thick toenails that are difficult to trim. Painful toenails interfere with ambulation. Aggravating factors include wearing enclosed shoe gear. Pain is relieved with periodic professional debridement. Painful corns are aggravated when weightbearing when wearing enclosed shoe gear. Pain is relieved with periodic professional debridement.  Patient states blood glucose was 161 mg/dl today.  Last known HgA1c was around 7%.  PCP is [name], Joellen Jersey, DO , and last visit was March 16, 2022.  Allergies  Allergen Reactions   Gabapentin Other (See Comments)    Pt states medication made her feel "out of it".   Pt states medication made her feel "out of it".      Review of Systems: Negative except as noted in the HPI.  Objective: No changes noted in today's physical examination. General: Patient is a pleasant 71 y.o. African American female WD, WN in NAD. AAO x 3.   Neurovascular Examination: Capillary refill time to digits immediate b/l. Palpable pedal pulses b/l LE. Pedal hair present. No pain with calf compression b/l. Lower extremity skin temperature gradient within normal limits. No edema noted b/l LE. No cyanosis or clubbing noted b/l LE.  Pt has subjective symptoms of neuropathy. Protective sensation intact 5/5 intact bilaterally with 10g monofilament b/l. Vibratory sensation intact b/l.  Dermatological:  Pedal integument with normal turgor, texture and tone BLE. No open wounds b/l LE. No interdigital macerations noted b/l LE. Toenail(s) 1-5 bilaterally elongated, discolored, dystrophic, thickened. Nails are crumbly with subungual debris and there is exquisite tenderness to dorsal palpation. No subungual wound(s) noted. Hyperkeratotic lesion(s) R 5th toe.  No erythema, no edema, no  drainage, no fluctuance.  Musculoskeletal:  Muscle strength 5/5 to all lower extremity muscle groups bilaterally. HAV with bunion deformity noted b/l LE. Hammertoe(s) noted to the R 5th toe.     Latest Ref Rng & Units 02/03/2022    1:55 PM 11/17/2021   11:24 AM 09/02/2021    1:36 PM  Hemoglobin A1C  Hemoglobin-A1c 4.0 - 5.6 % 8.4   7.4   6.9     Assessment/Plan: 1. Pain in both lower extremities   2. Corns   3. Diabetic peripheral neuropathy associated with type 2 diabetes mellitus ([place])      -No new findings. No new orders. -Continue diabetic foot care principles: inspect feet daily, monitor glucose as recommended by PCP and/or Endocrinologist, and follow prescribed diet per PCP, Endocrinologist and/or dietician. -Patient to continue soft, supportive shoe gear daily. -Mycotic toenails 1-5 bilaterally were debrided in length and girth with sterile nail nippers and dremel without incident. -Corn(s) R 5th toe pared utilizing sterile scalpel blade without complication or incident. Total number debrided=1. -Patient/POA to call should there be question/concern in the interim.   Return in about 10 weeks (around 07/20/2022).  Marzetta Board, DPM

## 2022-05-17 ENCOUNTER — Other Ambulatory Visit (HOSPITAL_COMMUNITY): Payer: Self-pay

## 2022-05-17 ENCOUNTER — Encounter: Payer: Self-pay | Admitting: Internal Medicine

## 2022-05-17 ENCOUNTER — Encounter: Payer: Self-pay | Admitting: Dietician

## 2022-05-17 ENCOUNTER — Ambulatory Visit (INDEPENDENT_AMBULATORY_CARE_PROVIDER_SITE_OTHER): Payer: Medicare Other | Admitting: Internal Medicine

## 2022-05-17 ENCOUNTER — Other Ambulatory Visit: Payer: Self-pay

## 2022-05-17 VITALS — BP 192/86 | HR 77 | Temp 98.1°F | Ht 61.5 in | Wt 134.0 lb

## 2022-05-17 DIAGNOSIS — E1142 Type 2 diabetes mellitus with diabetic polyneuropathy: Secondary | ICD-10-CM | POA: Diagnosis not present

## 2022-05-17 DIAGNOSIS — I10 Essential (primary) hypertension: Secondary | ICD-10-CM

## 2022-05-17 DIAGNOSIS — N6313 Unspecified lump in the right breast, lower outer quadrant: Secondary | ICD-10-CM | POA: Diagnosis not present

## 2022-05-17 DIAGNOSIS — E1121 Type 2 diabetes mellitus with diabetic nephropathy: Secondary | ICD-10-CM | POA: Diagnosis not present

## 2022-05-17 DIAGNOSIS — Z794 Long term (current) use of insulin: Secondary | ICD-10-CM | POA: Diagnosis not present

## 2022-05-17 DIAGNOSIS — Z7984 Long term (current) use of oral hypoglycemic drugs: Secondary | ICD-10-CM

## 2022-05-17 LAB — POCT GLYCOSYLATED HEMOGLOBIN (HGB A1C): Hemoglobin A1C: 7.9 % — AB (ref 4.0–5.6)

## 2022-05-17 LAB — GLUCOSE, CAPILLARY: Glucose-Capillary: 169 mg/dL — ABNORMAL HIGH (ref 70–99)

## 2022-05-17 MED ORDER — OLMESARTAN MEDOXOMIL 20 MG PO TABS
20.0000 mg | ORAL_TABLET | Freq: Every day | ORAL | 2 refills | Status: DC
Start: 2022-05-17 — End: 2022-06-01
  Filled 2022-05-17: qty 90, 90d supply, fill #0

## 2022-05-17 MED ORDER — RYBELSUS 3 MG PO TABS
1.0000 | ORAL_TABLET | Freq: Every day | ORAL | 0 refills | Status: DC
Start: 2022-05-17 — End: 2022-06-16
  Filled 2022-05-17: qty 30, 30d supply, fill #0

## 2022-05-17 NOTE — Patient Instructions (Signed)
Thank you, Amanda [name] for allowing [place] to provide your care today. Today we discussed:  High Blood Pressure: -Your blood pressures were high today: 192/86 -I will start you on Olmesartan '20mg'$  daily -We will check kidney function today -Continue to check your blood pressures at home and bring log to next appointment -Follow low sodium diet plan provided -Follow up in one month to recheck blood pressure.  Diabetes: -Your Hgb A1c today is 7.9% -I have sent in for Rybelsus '3mg'$  daily which is the same medication as the Ozempic but a pill form that is taken daily.  -Continue your metformin and Jardiance daily -We will increase Rybelsus at your next appointment if needed  I have ordered the following labs for you:  Lab Orders         Glucose, capillary         POC Hbg A1C       My Chart Access: [email or web address]  Please follow-up in one month.  Please make sure to arrive 15 minutes prior to your next appointment. If you arrive late, you may be asked to reschedule.    We look forward to seeing you next time. Please call our clinic at [phone] if you have any questions or concerns. The best time to call is Monday-Friday from 9am-4pm, but there is someone available 24/7. If after hours or the weekend, call the main hospital number and ask for the Internal Medicine Resident On-Call. If you need medication refills, please notify your pharmacy one week in advance and they will send [place] a request.   Thank you for letting us take part in your care. Wishing you the best!  Wayland Denis, MD 05/17/2022, 3:10 PM IM Resident, PGY-1

## 2022-05-17 NOTE — Progress Notes (Unsigned)
CC: T2DM, HTN follow up  HPI:  Amanda [name] is a 71 y.o. female with a past medical history stated below.   No problem-specific Assessment & Plan notes found for this encounter. 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   Cataract    PRESENT IN LEFT/ CATARACTS REMOVED FROM RIGHT   Cervical radiculopathy due to degenerative joint disease of spine 05/03/2017   CKD (chronic kidney disease) stage 3, GFR 30-59 ml/min ([place]) 01/01/2021   Diabetes mellitus 1996   DKA (diabetic ketoacidoses) 01/02/2013   Family history of adverse reaction to anesthesia 01/02/2013   Mother was slow to wake up after a procedure   Gait abnormality 01/10/2019   Glaucoma    Hyperlipidemia    Hypertension    Hypotension    Influenza A (H1N1) 01/03/2013   Nail fungus 10/23/2017   Need for shingles vaccine 10/28/2021   Neuromuscular disorder ([place])    NEUROPATHY   Normocytic anemia 02/14/2013   Osteopenia 05/04/2016   Screening for osteoporosis 10/28/2021   Sinusitis 04/29/2021   SYNCOPE 05/10/2010   Qualifier: Diagnosis of  By: Marilynne Halsted, RN, BSN, Jacquelyn     Viral upper respiratory infection 12/23/2021    Current Outpatient Medications on File Prior to Visit  Medication Sig Dispense Refill   acetaminophen (TYLENOL) 325 MG tablet Take 2 tablets (650 mg total) by mouth every 6 (six) hours as needed (or Fever >/= 101). 60 tablet 0   atorvastatin (LIPITOR) 40 MG tablet Take 1 tablet (40 mg total) by mouth daily. 90 tablet 2   blood glucose meter kit and supplies KIT Dispense based on patient and insurance preference. Use up to four times daily as directed. . 1 each 0   cetirizine (ZYRTEC) 5 MG tablet Take 1 tablet (5 mg total) by mouth daily. (Patient taking differently: Take 5 mg by mouth daily as needed.) 30 tablet 1   diclofenac Sodium (VOLTAREN) 1 % GEL Apply 2 g topically daily as needed (pain).      Dulaglutide (TRULICITY) 4.5 HE/1.7EY SOPN Inject 4.5 mg as directed once a week. 2 mL 2   empagliflozin (JARDIANCE) 10 MG TABS tablet Take 1 tablet (10 mg total) by mouth daily before breakfast. 90 tablet 2   fluticasone (FLONASE) 50 MCG/ACT nasal spray Place 1 spray into both nostrils daily. 16 g 2   glucose blood test strip You do not medically need to check blood sugar while not taking insulin. You can check your blood sugar if you are concerned when you want, but do not need to 100 each 12   latanoprost (XALATAN) 0.005 % ophthalmic solution Place 1 drop into both eyes at bedtime.     metFORMIN (GLUCOPHAGE-XR) 500 MG 24 hr tablet Take 1 tablet (500 mg total) by mouth 2 (two) times daily with a meal. 90 tablet 3   Multiple Vitamins-Minerals (WOMENS 50+ MULTI VITAMIN/MIN) TABS Take 1 tablet by mouth daily. 30 tablet    oxyCODONE (OXY IR/ROXICODONE) 5 MG immediate release tablet Take 1 tablet (5 mg total) by mouth every 6 (six) hours as needed for severe pain. 15 tablet 0   [DISCONTINUED] glipiZIDE (GLUCOTROL) 5 MG tablet Take 0.5 tablets (2.5 mg total) by mouth daily before breakfast. 15 tablet 0   No current facility-administered medications on file prior to visit.    Family History  Problem Relation Age of  Onset   Diabetes Mother    Hypertension Mother    Breast cancer Mother    Cancer Mother    Diabetes Sister    Hypertension Sister    Kidney disease Sister        One Kidney transplant, one sister on HD   Breast cancer Sister    Cancer Sister    Hypertension Sister    Diabetes Sister    Cerebral palsy Brother    Cancer Brother        Unknown   Hypertension Daughter    Colon cancer Neg Hx    Esophageal cancer Neg Hx    Stomach cancer Neg Hx    Rectal cancer Neg Hx     Social History: ***  Review of Systems: ROS negative except for what is noted on the assessment and plan.  There were no vitals filed for this visit.   Physical Exam: General: Well appearing ***,  NAD HENT: normocephalic, atraumatic, external ears and nares appear unremarkable EYES: conjunctiva non-erythematous, no scleral icterus CV: regular rate, normal rhythm, no murmurs, rubs, gallops. LEE*** Pulmonary: normal work of breathing on RA, lungs clear to auscultation, no rales, wheezes, rhonchi Abdominal: non-distended, soft, non-tender to palpation, normal BS Skin: Warm and dry, no rashes or lesions on exposed surfaces Neurological: MS: awake, alert and oriented x3, normal speech and fund of knowledge Motor: moves all extremities antigravity Psych: normal affect     See Encounters Tab for problem based charting.  Patient {GC/GE:3044014::"discussed with","seen with"} Dr. {ZFPOI:[id number]::"JIZXYOFV","WAQLRJP","VGKKDP","TELMRAJH","HIDUPB","DHDIXBO","ERQSXQKS","KSH"}  Wayland Denis, M.D. [facility], PGY-1 Pager: [phone] Date 05/17/2022 Time 2:13 PM

## 2022-05-18 ENCOUNTER — Telehealth: Payer: Self-pay

## 2022-05-18 ENCOUNTER — Other Ambulatory Visit (HOSPITAL_COMMUNITY): Payer: Self-pay

## 2022-05-18 ENCOUNTER — Encounter: Payer: Self-pay | Admitting: Internal Medicine

## 2022-05-18 DIAGNOSIS — E1121 Type 2 diabetes mellitus with diabetic nephropathy: Secondary | ICD-10-CM | POA: Insufficient documentation

## 2022-05-18 LAB — BMP8+ANION GAP
Anion Gap: 12 mmol/L (ref 10.0–18.0)
BUN/Creatinine Ratio: 20 (ref 12–28)
BUN: 32 mg/dL — ABNORMAL HIGH (ref 8–27)
CO2: 24 mmol/L (ref 20–29)
Calcium: 9.2 mg/dL (ref 8.7–10.3)
Chloride: 104 mmol/L (ref 96–106)
Creatinine, Ser: 1.58 mg/dL — ABNORMAL HIGH (ref 0.57–1.00)
Glucose: 142 mg/dL — ABNORMAL HIGH (ref 70–99)
Potassium: 4 mmol/L (ref 3.5–5.2)
Sodium: 140 mmol/L (ref 134–144)
eGFR: 35 mL/min/{1.73_m2} — ABNORMAL LOW (ref >59)

## 2022-05-18 NOTE — Telephone Encounter (Signed)
Pt called requesting samples of jardiance medication. Says her copay is $100+ at the pharmacy now.  Informed pt I would provide 2 weeks worth of samples and in the meantime she should reach out to [facility] and apply for the Low Income Subsidy/ Extra Help. If approved, her copays should go down at the pharmacy. If denied, she will be eligible for patient assistance with [employer].    Pt agreed and will p/u samples before noon.

## 2022-05-18 NOTE — Assessment & Plan Note (Signed)
Patient's Hgb A1c today 7.9%, down from 8.4% in Feb 2023. Patient reports fasting morning CBGs 140s. Does not have her glucometer today. She stopped taking her Trulicity one month ago since the price switched from $45/month to $200/month and this was too expensive for her. Encouraged patient to reach out to clinic next time this happens. She is still taking her Jardiance 10 mg daily and Metformin '500mg'$  BID. Lowering the dose of Metformin has resolved her diarrhea, now she is intermittently constipated for which she takes Miralax.    On assessment T2DM is not optimally controlled due to discontinuing GLP1 agonist secondary to financial barrier obtaining medicine. Seems patient's insurance will cover Ozempic or Rybelsus, discussed both with patient and she will try Rybelsus. Counseled patient that we will start her on lowest dose since she has been off GLP1 agonists for several weeks.  Plan: -Continue Metformin '500mg'$  BID -Continue Jardiance '10mg'$  daily -Start Rybelsus '3mg'$  daily for 4 weeks -Follow up in one month to check glucometer and up titrate Rybelsus if indicated -Next Hgb A1c Sept-Oct

## 2022-05-18 NOTE — Assessment & Plan Note (Signed)
Patient s/p right breast lumpectomy. Pathology showed atypical lobular hyperplasia. She was seen by surgery, Dr.Cornett, for follow up on 03/11/22. Plan was for patient to undergo yearly breast MRI given high risk state. Lifetime risk over 20%. She will follow up with surgery in one year.

## 2022-05-18 NOTE — Telephone Encounter (Signed)
Medication Samples have been provided to the patient.  Drug name: JARDIANCE       Strength: '10MG'$         Qty: 93  LOT: [id number]  Exp.Date: AUG 2023  Dosing instructions: TAKE 1 TABLET DAILY BEFORE BREAKFAST  (Gave patient a 2 month supply in meantime due to exp date -- more samples being put out)  [place] De[place] 11:04 AM 05/18/2022

## 2022-05-18 NOTE — Assessment & Plan Note (Addendum)
Patient's BP today 200/75 and 192/86 on second check. Patient feels at baseline today, no blurry vision, no severe headache, no focal neurologic deficits. She forgot her BP log at home though reports systolic's around 527-782U. She is currently not on antihypertensive medications since Chlorthalidone was discontinued at last OV 02/2022 when patient has low normal BPs. Patient followed up with Nephrology in April 2023 and BP at that OV 148/86. Patient's microalbumin/creatine ratio improved from prior, still elevated at 77. Nephrology did not start antihypertensive due to patient's home Bps low normal with some symptoms of orthostatic hypotension. Since this visit patient has discontinued Trulicity and admits to dietary indiscretion.   Patient has uncontrolled hypertension likely secondary to dietary indiscretion. Patient also has history of T2DM, diabetic nephropathy with proteinuria and CKD stage 3. She would benefit from addition of low dose ARB for BP control and to prevent progression of CKD related to T2DM. Will check BMP given very elevated Bps today.  Plan: -Start Olmesartan '20mg'$  daily -Check BMP -Provided printout on low sodium diet  BMP showed stable kidney function, no electrolyte abnormalities.

## 2022-05-19 ENCOUNTER — Other Ambulatory Visit (HOSPITAL_COMMUNITY): Payer: Self-pay

## 2022-05-19 NOTE — Progress Notes (Signed)
Internal Medicine Clinic Attending ? ?Case discussed with Dr. Zinoviev  At the time of the visit.  We reviewed the resident?s history and exam and pertinent patient test results.  I agree with the assessment, diagnosis, and plan of care documented in the resident?s note.  ?

## 2022-05-26 ENCOUNTER — Telehealth: Payer: Self-pay | Admitting: *Deleted

## 2022-05-26 NOTE — Telephone Encounter (Addendum)
Call from pt who stated her BP was 175/84 at the eye doctor's office. She was last seen here 5/30 and was started on Olmesartan. Stated prior to today, her BP's have been 140's-150's/ 70's-low 80's. But she's c/o h/a's,and lightheadedness.  I asked if she's drinking plenty of water; she stated "no". But stated she has been watching the NA+ in her diet. No available appts until Monday. Please advise.

## 2022-05-27 ENCOUNTER — Other Ambulatory Visit (HOSPITAL_COMMUNITY): Payer: Self-pay

## 2022-05-27 ENCOUNTER — Encounter: Payer: Self-pay | Admitting: Internal Medicine

## 2022-05-27 ENCOUNTER — Ambulatory Visit (INDEPENDENT_AMBULATORY_CARE_PROVIDER_SITE_OTHER): Payer: Medicare Other | Admitting: Internal Medicine

## 2022-05-27 ENCOUNTER — Other Ambulatory Visit: Payer: Self-pay

## 2022-05-27 VITALS — BP 141/74 | HR 78 | Temp 97.6°F | Ht 61.5 in | Wt 135.5 lb

## 2022-05-27 DIAGNOSIS — I10 Essential (primary) hypertension: Secondary | ICD-10-CM | POA: Diagnosis not present

## 2022-05-27 DIAGNOSIS — E878 Other disorders of electrolyte and fluid balance, not elsewhere classified: Secondary | ICD-10-CM | POA: Diagnosis not present

## 2022-05-27 MED ORDER — AMLODIPINE BESYLATE 5 MG PO TABS
5.0000 mg | ORAL_TABLET | Freq: Every day | ORAL | 11 refills | Status: DC
  Filled 2022-05-27: qty 30, 30d supply, fill #0

## 2022-05-27 NOTE — Telephone Encounter (Signed)
RTC from patient said that she was returning a call from the office this morning.

## 2022-05-27 NOTE — Assessment & Plan Note (Addendum)
Pt was seen in the clinic 10 days ago with BP readings 200/75 and 192/86. Self-reported home readings were 135-140's at that time She was asymptomatic and at her baseline. Was previously on Chlorthalidone, but was discontinued in 02/2022 for low-normal BP. Given elevated readings, Olmesartan 20 mg daily was started after BMP showed stable Cr 1.63 -> 1.58/ stable CKD3b.  The pt is following up today as an acute visit because her BP was elevated 175/84 yesterday at her oral surgeons office. BP is 141/71 today in the office. Home readings ranging SBP 130-180's and DBP 70-90's. She is asymptomatic at this time. She could be experiencing masked HTN in the clinic. She does not use a wrist monitor -- her readings are likely accurate. Reports good med compliance and tolerating well without adverse effects.   Given persistently elevated readings in multiple settings and BP not at goal with Olmesartan alone, will add amlodipine 5 mg daily to her regimen, which she tolerated well in the past.   - Continue Olmesartan 20 mg daily - Recheck BMP today after starting Olmesartan - Start Amlodipine 5 mg daily  - If BP remains elevated, can up-titrate amlodipine to 10 mg versus Olmesartan to 40 mg if renal function and potassium allows.     Addendum: BMP with improving renal function  Cr 1.63 -> 1.58 -> 1.43. Continue with Olmesartan 20 mg qd.

## 2022-05-27 NOTE — Progress Notes (Signed)
CC: acute visit for blood pressure    HPI:  [name] is a 71 y.o. female with a PMHx stated below and presents today for stated above. Please see the Encounters tab for problem-based Assessment & Plan for additional details.   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 Colonoscopy January 2020 was negative for microscopic colitis and other causes. Likely medication-related vs. IBS. GI doing a work up with celiac labs,  fecal pancreatic elastase and SIBO breath test.   EGD 06/07/21 biopsy consistent with H. pylori   CKD (chronic kidney disease) stage 3, GFR 30-59 ml/min ([place]) 01/01/2021   Diabetes mellitus 1996   DKA (diabetic ketoacidoses) 01/02/2013   Family history of adverse reaction to anesthesia 01/02/2013   Mother was slow to wake up after a procedure   Gait abnormality 01/10/2019   Glaucoma    Hyperlipidemia    Hypertension    Hypotension    Influenza A (H1N1) 01/03/2013   Nail fungus 10/23/2017   Need for shingles vaccine 10/28/2021   Neuromuscular disorder ([place])    NEUROPATHY   Normocytic anemia 02/14/2013   Osteopenia 05/04/2016   Screening for osteoporosis 10/28/2021   Sinusitis 04/29/2021   SYNCOPE 05/10/2010   Qualifier: Diagnosis of  By: Marilynne Halsted, RN, BSN, Jacquelyn     Viral upper respiratory infection 12/23/2021    Current Outpatient Medications on File Prior to Visit  Medication Sig Dispense Refill   acetaminophen (TYLENOL) 325 MG tablet Take 2 tablets (650 mg total) by mouth every 6 (six) hours as needed (or Fever >/= 101). 60 tablet 0   atorvastatin (LIPITOR) 40 MG tablet Take 1 tablet (40 mg total) by mouth daily. 90  tablet 2   blood glucose meter kit and supplies KIT Dispense based on patient and insurance preference. Use up to four times daily as directed. . 1 each 0   cetirizine (ZYRTEC) 5 MG tablet Take 1 tablet (5 mg total) by mouth daily. (Patient taking differently: Take 5 mg by mouth daily as needed.) 30 tablet 1   diclofenac Sodium (VOLTAREN) 1 % GEL Apply 2 g topically daily as needed (pain).     empagliflozin (JARDIANCE) 10 MG TABS tablet Take 1 tablet (10 mg total) by mouth daily before breakfast. 90 tablet 2   fluticasone (FLONASE) 50 MCG/ACT nasal spray Place 1 spray into both nostrils daily. 16 g 2   glucose blood test strip You do not medically need to check blood sugar while not taking insulin. You can check your blood sugar if you are concerned when you want, but do not need to 100 each 12   latanoprost (XALATAN) 0.005 % ophthalmic solution Place 1 drop into both eyes at bedtime.     metFORMIN (GLUCOPHAGE-XR) 500 MG 24 hr tablet Take 1 tablet (500 mg total) by mouth 2 (two) times daily with a meal. 90 tablet 3   Multiple Vitamins-Minerals (WOMENS 50+ MULTI VITAMIN/MIN) TABS Take 1 tablet by mouth daily. 30 tablet    olmesartan (BENICAR) 20 MG tablet Take 1 tablet (20 mg total) by mouth daily. 90 tablet 2   Semaglutide (RYBELSUS)  3 MG TABS Take 1 tablet by mouth daily 30 minutes before eating or taking other medications. 30 tablet 0   [DISCONTINUED] glipiZIDE (GLUCOTROL) 5 MG tablet Take 0.5 tablets (2.5 mg total) by mouth daily before breakfast. 15 tablet 0   No current facility-administered medications on file prior to visit.    Family History  Problem Relation Age of Onset   Diabetes Mother    Hypertension Mother    Breast cancer Mother    Cancer Mother    Diabetes Sister    Hypertension Sister    Kidney disease Sister        One Kidney transplant, one sister on HD   Breast cancer Sister    Cancer Sister    Hypertension Sister    Diabetes Sister    Cerebral palsy Brother     Cancer Brother        Unknown   Hypertension Daughter    Colon cancer Neg Hx    Esophageal cancer Neg Hx    Stomach cancer Neg Hx    Rectal cancer Neg Hx     Social History   Socioeconomic History   Marital status: Married    Spouse name: Not on file   Number of children: 1   Years of education: Not on file   Highest education level: Not on file  Occupational History   Occupation: Retired    Comment: Printmaker  Tobacco Use   Smoking status: Never   Smokeless tobacco: Never  Vaping Use   Vaping Use: Never used  Substance and Sexual Activity   Alcohol use: No    Alcohol/week: 0.0 standard drinks of alcohol   Drug use: No   Sexual activity: Not Currently    Birth control/protection: Surgical  Other Topics Concern   Not on file  Social History Narrative   Current Social History 04/23/2021        Patient lives with husband in a one level home with ramp      Patient's method of transportation is personal car shared with her mother.      The highest level of education was high school diploma.      The patient currently retired from [employer].   Social Determinants of Health   Financial Resource Strain: Medium Risk (04/15/2020)   Overall Financial Resource Strain (CARDIA)    Difficulty of Paying Living Expenses: Somewhat hard  Food Insecurity: Food Insecurity Present (04/15/2020)   Hunger Vital Sign    Worried About Running Out of Food in the Last Year: Sometimes true    Ran Out of Food in the Last Year: Sometimes true  Transportation Needs: No Transportation Needs (04/15/2020)   PRAPARE - Hydrologist (Medical): No    Lack of Transportation (Non-Medical): No  Physical Activity: Not on file  Stress: Not on file  Social Connections: Not on file  Intimate Partner Violence: Not on file    Review of Systems: ROS negative except for what is noted on the assessment and plan.  Vitals:   05/27/22 1051  BP: (!) 141/74  Pulse: 78  Temp:  97.6 F (36.4 C)  TempSrc: Oral  SpO2: 100%  Weight: 135 lb 8 oz (61.5 kg)  Height: 5' 1.5" (1.562 m)    Physical Exam: Constitutional: alert, well-appearing, in NAD Eyes: conjunctiva non-erythematous, EOMI Cardiovascular: RRR, no m/r/g, non-edematous bilateral LE Pulmonary/Chest: normal work of breathing on RA, LCTAB MSK: normal bulk and tone  Neurological: A&O x  3, follows commands, no focal neurological deficits  Skin: warm and dry  Psych: normal behavior, normal affect    Assessment & Plan:   See Encounters Tab for problem based charting.  Patient discussed with Dr. Constance Goltz, MD  Internal Medicine Resident, PGY-1 [facility]

## 2022-05-27 NOTE — Telephone Encounter (Signed)
Called pt about her BP - she stated it's still elevated. Stated she's willing to come in this am. Appt schedule w/ Dr Posey Pronto @ 1045 AM.

## 2022-05-27 NOTE — Patient Instructions (Signed)
Continue Olmesartan 20 mg daily   Start takimg Amlodipine 5 mg daily   I will call with lab results -- see you next time!

## 2022-05-28 LAB — BMP8+ANION GAP
Anion Gap: 15 mmol/L (ref 10.0–18.0)
BUN/Creatinine Ratio: 18 (ref 12–28)
BUN: 26 mg/dL (ref 8–27)
CO2: 21 mmol/L (ref 20–29)
Calcium: 9.1 mg/dL (ref 8.7–10.3)
Chloride: 107 mmol/L — ABNORMAL HIGH (ref 96–106)
Creatinine, Ser: 1.43 mg/dL — ABNORMAL HIGH (ref 0.57–1.00)
Glucose: 177 mg/dL — ABNORMAL HIGH (ref 70–99)
Potassium: 4.4 mmol/L (ref 3.5–5.2)
Sodium: 143 mmol/L (ref 134–144)
eGFR: 40 mL/min/{1.73_m2} — ABNORMAL LOW (ref >59)

## 2022-05-31 ENCOUNTER — Telehealth: Payer: Self-pay | Admitting: *Deleted

## 2022-05-31 NOTE — Progress Notes (Signed)
Internal Medicine Clinic Attending  Case discussed with Dr. Patel  At the time of the visit.  We reviewed the resident's history and exam and pertinent patient test results.  I agree with the assessment, diagnosis, and plan of care documented in the resident's note.  

## 2022-05-31 NOTE — Telephone Encounter (Signed)
Patient called in stating she has been having diarrhea x 3-4 days since starting amlodipine 5 mg. She will not take it tomorrow. She was given tele appt tomorrow with [facility] Central Team Provider for med management.

## 2022-06-01 ENCOUNTER — Other Ambulatory Visit (HOSPITAL_COMMUNITY): Payer: Self-pay

## 2022-06-01 ENCOUNTER — Ambulatory Visit (INDEPENDENT_AMBULATORY_CARE_PROVIDER_SITE_OTHER): Payer: Medicare Other | Admitting: Internal Medicine

## 2022-06-01 DIAGNOSIS — I10 Essential (primary) hypertension: Secondary | ICD-10-CM

## 2022-06-01 MED ORDER — OLMESARTAN MEDOXOMIL 40 MG PO TABS
40.0000 mg | ORAL_TABLET | Freq: Every day | ORAL | 2 refills | Status: DC
  Filled 2022-06-01 – 2022-06-15 (×2): qty 30, 30d supply, fill #0
  Filled 2022-07-18: qty 30, 30d supply, fill #1
  Filled 2022-08-23: qty 30, 30d supply, fill #2

## 2022-06-01 NOTE — Assessment & Plan Note (Addendum)
Patient developed diarrhea after starting amlodipine.  She discontinued the amlodipine and stated the diarrhea had resolved.  Patient has long history of diarrhea and it appears she was on amlodipine at that time.  For this reason we will avoid amlodipine.  She is reporting elevated blood pressure readings with most readings 140s/70s and 1 reading at 171/94.  She is denying any headache, chest pain, shortness of breath.  Given this data I would like to increase her olmesartan to 40 mg daily and if her blood pressure is still elevated we can add chlorthalidone.  She has been previously on chlorthalidone with good blood pressure control. -Discontinue amlodipine 5 mg and increase olmesartan to 40 mg qd.  -Follow up on BP in 3 weeks with PCP with repeated lab work.  Reported Daily BP Readings: 04/26/22: 144/76 04/27/22: 127/73 04/28/22: 171/94 04/29/22: 150/78 04/30/22: 147/71

## 2022-06-01 NOTE — Progress Notes (Signed)
  [facility] Residency Telephone Encounter Continuity Care Appointment  HPI:  This telephone encounter was created for [name] on 06/01/2022 for the following purpose/cc medications side effect and htn follow up.   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 Colonoscopy January 2020 was negative for microscopic colitis and other causes. Likely medication-related vs. IBS. GI doing a work up with celiac labs,  fecal pancreatic elastase and SIBO breath test.   EGD 06/07/21 biopsy consistent with H. pylori   CKD (chronic kidney disease) stage 3, GFR 30-59 ml/min ([place]) 01/01/2021   Diabetes mellitus 1996   DKA (diabetic ketoacidoses) 01/02/2013   Family history of adverse reaction to anesthesia 01/02/2013   Mother was slow to wake up after a procedure   Gait abnormality 01/10/2019   Glaucoma    Hyperlipidemia    Hypertension    Hypotension    Influenza A (H1N1) 01/03/2013   Nail fungus 10/23/2017   Need for shingles vaccine 10/28/2021   Neuromuscular disorder ([place])    NEUROPATHY   Normocytic anemia 02/14/2013   Osteopenia 05/04/2016   Screening for osteoporosis 10/28/2021   Sinusitis 04/29/2021   SYNCOPE 05/10/2010   Qualifier: Diagnosis of  By: Marilynne Halsted, RN, BSN, Jacquelyn     Viral upper respiratory infection 12/23/2021     ROS:  Negative unless stated in the problem list.   Assessment / Plan / Recommendations:  Please see A&P under problem oriented charting for assessment of the patient's acute and chronic medical conditions.  As always, pt is advised that if symptoms worsen or new symptoms arise,  they should go to an urgent care facility or to to ER for further evaluation.   Consent and Medical Decision Making:  Patient discussed with Dr. Heber Panola. This is a telephone encounter between Amanda [name] and Idamae Schuller on 06/01/2022 for medication side effect and htn follow up. The visit was conducted with the patient located at home and Idamae Schuller at [facility]. The patient's identity was confirmed using their DOB and current address. The patient has consented to being evaluated through a telephone encounter and understands the associated risks (an examination cannot be done and the patient may need to come in for an appointment) / benefits (allows the patient to remain at home, decreasing exposure to coronavirus). I personally spent 22 minutes on medical discussion.

## 2022-06-02 ENCOUNTER — Other Ambulatory Visit (HOSPITAL_COMMUNITY): Payer: Self-pay

## 2022-06-03 ENCOUNTER — Other Ambulatory Visit: Payer: Self-pay | Admitting: Surgery

## 2022-06-03 DIAGNOSIS — N6099 Unspecified benign mammary dysplasia of unspecified breast: Secondary | ICD-10-CM

## 2022-06-03 NOTE — Progress Notes (Signed)
Internal Medicine Clinic Attending  Case discussed with the resident at the time of the visit.  We reviewed the resident's history and exam and pertinent patient test results.  I agree with the assessment, diagnosis, and plan of care documented in the resident's note.  

## 2022-06-06 ENCOUNTER — Telehealth: Payer: Self-pay

## 2022-06-06 NOTE — Telephone Encounter (Signed)
CMN Received - Shoes ordered and casts released from fabrication hold.  

## 2022-06-09 ENCOUNTER — Telehealth: Payer: Medicare Other | Admitting: Internal Medicine

## 2022-06-09 ENCOUNTER — Other Ambulatory Visit (HOSPITAL_COMMUNITY): Payer: Self-pay

## 2022-06-10 ENCOUNTER — Ambulatory Visit
Admission: RE | Admit: 2022-06-10 | Discharge: 2022-06-10 | Disposition: A | Discharge status: Home / Self Care | Payer: Medicare Other | Source: Ambulatory Visit | Attending: Surgery | Admitting: Surgery

## 2022-06-10 DIAGNOSIS — N6099 Unspecified benign mammary dysplasia of unspecified breast: Secondary | ICD-10-CM

## 2022-06-10 MED ORDER — GADOBUTROL 1 MMOL/ML IV SOLN
6.0000 mL | Freq: Once | INTRAVENOUS | Status: AC | PRN
  Administered 2022-06-10: 6 mL via INTRAVENOUS

## 2022-06-15 ENCOUNTER — Other Ambulatory Visit (HOSPITAL_COMMUNITY): Payer: Self-pay

## 2022-06-16 ENCOUNTER — Other Ambulatory Visit (HOSPITAL_COMMUNITY): Payer: Self-pay

## 2022-06-16 ENCOUNTER — Other Ambulatory Visit: Payer: Self-pay | Admitting: Internal Medicine

## 2022-06-16 ENCOUNTER — Encounter: Payer: Medicare Other | Admitting: Internal Medicine

## 2022-06-16 DIAGNOSIS — Z794 Long term (current) use of insulin: Secondary | ICD-10-CM

## 2022-06-16 MED ORDER — RYBELSUS 3 MG PO TABS
1.0000 | ORAL_TABLET | Freq: Every day | ORAL | 0 refills | Status: DC
  Filled 2022-06-16: qty 30, 30d supply, fill #0

## 2022-06-16 NOTE — Telephone Encounter (Signed)
Will refill Rybelsus for 30 day supply. She has an appt 7/6 with PCP and would consider titrating medication to '7mg'$  dosing if she is tolerating medication ok.

## 2022-06-22 ENCOUNTER — Telehealth: Payer: Self-pay

## 2022-06-22 NOTE — Telephone Encounter (Signed)
Informed pt I have samples of rybelsus medication and a novo nordisk application (for patient assistance) for her to sign. Pt will bring proof of income (if she finds it).   Pt concerned her medications may change because of her increased sugars. Informed her to let her pcp know at her appt & if anything changes to just let me know.  Medication Samples have been provided to the patient.  Drug name: RYBELSUS       Strength: '3MG'$         Qty: 30  LOT: [id number]  Exp.Date: 03/2023  Dosing instructions: TAKE 1 TABLET BY MOUTH ONCE DAILY 30 MINS BEFORE EATING OR TAKING OTHER MEDICATIONS.  Talbert Cage Sagan Maselli 11:09 AM 06/22/2022

## 2022-06-23 ENCOUNTER — Other Ambulatory Visit (HOSPITAL_COMMUNITY): Payer: Self-pay

## 2022-06-23 ENCOUNTER — Encounter: Payer: Self-pay | Admitting: Internal Medicine

## 2022-06-23 ENCOUNTER — Ambulatory Visit (INDEPENDENT_AMBULATORY_CARE_PROVIDER_SITE_OTHER): Payer: Medicare Other | Admitting: Internal Medicine

## 2022-06-23 VITALS — BP 153/91 | HR 72 | Temp 97.8°F | Ht 61.5 in | Wt 131.2 lb

## 2022-06-23 DIAGNOSIS — I10 Essential (primary) hypertension: Secondary | ICD-10-CM | POA: Diagnosis not present

## 2022-06-23 DIAGNOSIS — Z7984 Long term (current) use of oral hypoglycemic drugs: Secondary | ICD-10-CM | POA: Diagnosis not present

## 2022-06-23 DIAGNOSIS — F331 Major depressive disorder, recurrent, moderate: Secondary | ICD-10-CM | POA: Diagnosis not present

## 2022-06-23 DIAGNOSIS — E1142 Type 2 diabetes mellitus with diabetic polyneuropathy: Secondary | ICD-10-CM | POA: Diagnosis not present

## 2022-06-23 MED ORDER — CHLORTHALIDONE 25 MG PO TABS
25.0000 mg | ORAL_TABLET | Freq: Every day | ORAL | 2 refills | Status: DC
  Filled 2022-06-23: qty 30, 30d supply, fill #0

## 2022-06-23 NOTE — Progress Notes (Signed)
Subjective:  CC: follow-up on hypertension and diabetes  HPI:  [name] is a 71 y.o. female with a past medical history stated below and presents today for follow-up on hypertension and diabetes. Please see problem based assessment and plan for additional details.  Past medical history: Type 2 diabetes CKD stage IIIb Hypertension Osteopenia   Current Outpatient Medications on File Prior to Visit  Medication Sig Dispense Refill   acetaminophen (TYLENOL) 325 MG tablet Take 2 tablets (650 mg total) by mouth every 6 (six) hours as needed (or Fever >/= 101). 60 tablet 0   atorvastatin (LIPITOR) 40 MG tablet Take 1 tablet (40 mg total) by mouth daily. 90 tablet 2   blood glucose meter kit and supplies KIT Dispense based on patient and insurance preference. Use up to four times daily as directed. . 1 each 0   cetirizine (ZYRTEC) 5 MG tablet Take 1 tablet (5 mg total) by mouth daily. (Patient taking differently: Take 5 mg by mouth daily as needed.) 30 tablet 1   diclofenac Sodium (VOLTAREN) 1 % GEL Apply 2 g topically daily as needed (pain).     empagliflozin (JARDIANCE) 10 MG TABS tablet Take 1 tablet (10 mg total) by mouth daily before breakfast. 90 tablet 2   fluticasone (FLONASE) 50 MCG/ACT nasal spray Place 1 spray into both nostrils daily. 16 g 2   glucose blood test strip You do not medically need to check blood sugar while not taking insulin. You can check your blood sugar if you are concerned when you want, but do not need to 100 each 12   latanoprost (XALATAN) 0.005 % ophthalmic solution Place 1 drop into both eyes at bedtime.     metFORMIN (GLUCOPHAGE-XR) 500 MG 24 hr tablet Take 1 tablet (500 mg total) by mouth 2 (two) times daily with a meal. 90 tablet 3   Multiple Vitamins-Minerals (WOMENS 50+ MULTI VITAMIN/MIN) TABS Take 1 tablet by mouth daily. 30 tablet    olmesartan (BENICAR) 40 MG tablet Take 1 tablet (40 mg total) by mouth daily. 30 tablet 2   Semaglutide  (RYBELSUS) 3 MG TABS Take 1 tablet by mouth daily 30 minutes before eating or taking other medications. 30 tablet 0   [DISCONTINUED] glipiZIDE (GLUCOTROL) 5 MG tablet Take 0.5 tablets (2.5 mg total) by mouth daily before breakfast. 15 tablet 0   No current facility-administered medications on file prior to visit.    Family History  Problem Relation Age of Onset   Diabetes Mother    Hypertension Mother    Breast cancer Mother    Cancer Mother    Diabetes Sister    Hypertension Sister    Kidney disease Sister        One Kidney transplant, one sister on HD   Breast cancer Sister    Cancer Sister    Hypertension Sister    Diabetes Sister    Cerebral palsy Brother    Cancer Brother        Unknown   Hypertension Daughter    Colon cancer Neg Hx    Esophageal cancer Neg Hx    Stomach cancer Neg Hx    Rectal cancer Neg Hx     Social History   Socioeconomic History   Marital status: Married    Spouse name: Not on file   Number of children: 1   Years of education: Not on file   Highest education level: Not on file  Occupational History   Occupation:  Retired    Comment: Printmaker  Tobacco Use   Smoking status: Never   Smokeless tobacco: Never  Vaping Use   Vaping Use: Never used  Substance and Sexual Activity   Alcohol use: No    Alcohol/week: 0.0 standard drinks of alcohol   Drug use: No   Sexual activity: Not Currently    Birth control/protection: Surgical  Other Topics Concern   Not on file  Social History Narrative   Current Social History 04/23/2021        Patient lives with husband in a one level home with ramp      Patient's method of transportation is personal car shared with her mother.      The highest level of education was high school diploma.      The patient currently retired from [employer].   Social Determinants of Health   Financial Resource Strain: Medium Risk (04/15/2020)   Overall Financial Resource Strain (CARDIA)    Difficulty of Paying  Living Expenses: Somewhat hard  Food Insecurity: Food Insecurity Present (04/15/2020)   Hunger Vital Sign    Worried About Running Out of Food in the Last Year: Sometimes true    Ran Out of Food in the Last Year: Sometimes true  Transportation Needs: No Transportation Needs (04/15/2020)   PRAPARE - Hydrologist (Medical): No    Lack of Transportation (Non-Medical): No  Physical Activity: Not on file  Stress: Not on file  Social Connections: Not on file  Intimate Partner Violence: Not on file    Review of Systems: ROS negative except for what is noted on the assessment and plan.  Objective:   Vitals:   06/23/22 1451 06/23/22 1458  BP: (!) 176/103 (!) 153/91  Pulse: 73 72  Temp: 97.8 F (36.6 C)   TempSrc: Oral   SpO2: 100%   Weight: 131 lb 3.2 oz (59.5 kg)   Height: 5' 1.5" (1.562 m)     Physical Exam: Constitutional: well-appearing, in no acute distress Cardiovascular: regular rate and rhythm, no m/r/g Pulmonary/Chest: normal work of breathing on room air, lungs clear to auscultation bilaterally Abdominal: soft, non-tender, non-distended MSK: normal bulk and tone Skin: warm and dry Psych: normal mood and affect     Assessment & Plan:  MDD (major depressive disorder), recurrent episode, moderate (HCC) PHQ noted to be elevated at 8 today.  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  She is interested in therapy, but would like to avoid medications as she has tried Cymbalta in the past and did not find this to be helpful. A/P: Patient was given information for [facility] and instructed to call them to set up counseling.  Essential hypertension Patient presenting for follow-up from visit 2 weeks ago.  She was started on amlodipine in early June and developed diarrhea after this which resolved by discontinuing amlodipine.  Olmesartan dose was increased at last appointment.  She  currently takes olmesartan 40 mg daily and reports adherence with this medication.  She checks her blood pressure daily at home and states that it is typically around 140s over 70s.  Blood pressure initially elevated to 170s over 90s, when this was rechecked improved to 153/91. A/P: Repeat BMP showed creatinine from 1.43-1.67.  Her baseline creatinine is around 1.4-1.6.  Potassium is within normal limits at 4.2. -Continue olmesartan 40 mg -Restart chlorthalidone 25 mg -Follow-up via telehealth in 2 weeks,  will review BP log at that time  DM II (diabetes mellitus, type II), controlled ([place]) Patient with history of type 2 diabetes.  Last hemoglobin A1c in May went down from 8.4-7.9.  She is having difficulty obtaining GLP-1 medication and greatly appreciate Ms. Camille Harrison's assistance with applying for program for Rybelsus.  She states that she has been taking metformin, empagliflozin and has been out of Rybelsus for the last 30 days.  She is checking her blood sugars daily and understands that this is not required, but likes to keep close track of her blood sugars.  Blood sugars have been staying in the 200s. A/P: Patient with history of difficulty obtaining medications.  She received Rybelsus medication while in clinic.  We will follow-up with patient in 2 weeks to see if blood sugars have improved with restarting Rybelsus and continuing metformin with empagliflozin.  I will touch base with Hortencia Pilar to see the best way of likely titrating Rybelsus up to 7 mg while making sure patient continues to receive medication. -Continue metformin, empagliflozin, Rybelsus 3 mg daily -We will touch base with Ms. Hairston about Novo nordisk application and titrating medications once that has been applied for. -f/u in 2 weeks, make sure patient has applied for nordisk program and titrate rybelsus is sugars remain elevated. -repeat HgbA1c in September   Patient discussed with Dr. Wallace Cullens  Jaber Dunlow, D.O. [facility]  PGY-2 Pager: [phone]  Phone: [phone] Date 06/24/2022  Time 6:48 AM

## 2022-06-23 NOTE — Patient Instructions (Addendum)
Thank you, Ms.Alicja R Graeff for allowing [place] to provide your care today. Today we discussed   Blood pressure: Your blood pressure was high today. Please continue taking olmesartan  and I have sent in chlorthalidone. Take both of these medications. Please log your blood pressures one time daily and make telehealth appointment in 2 weeks to discuss blood pressure.  I will call you with lab results soon.  Stress: Please call [facility] to set up counseling [phone].  Diabetes: I will touch pass with our pharmacist here to see about getting rybelsus long term.  I have ordered the following labs for you:  Lab Orders  No laboratory test(s) ordered today     Referrals ordered today:   Referral Orders  No referral(s) requested today     I have ordered the following medication/changed the following medications:   Stop the following medications: There are no discontinued medications.   Start the following medications: No orders of the defined types were placed in this encounter.    Follow up: 2 weeks by telehealth to review blood pressure log   We look forward to seeing you next time. Please call our clinic at [phone] if you have any questions or concerns. The best time to call is Monday-Friday from 9am-4pm, but there is someone available 24/7. If after hours or the weekend, call the main hospital number and ask for the Internal Medicine Resident On-Call. If you need medication refills, please notify your pharmacy one week in advance and they will send [place] a request.   Thank you for trusting me with your care. Wishing you the best!   Christiana Fuchs, [place]

## 2022-06-24 ENCOUNTER — Encounter: Payer: Self-pay | Admitting: Internal Medicine

## 2022-06-24 LAB — BMP8+ANION GAP
Anion Gap: 13 mmol/L (ref 10.0–18.0)
BUN/Creatinine Ratio: 20 (ref 12–28)
BUN: 34 mg/dL — ABNORMAL HIGH (ref 8–27)
CO2: 21 mmol/L (ref 20–29)
Calcium: 9.3 mg/dL (ref 8.7–10.3)
Chloride: 104 mmol/L (ref 96–106)
Creatinine, Ser: 1.67 mg/dL — ABNORMAL HIGH (ref 0.57–1.00)
Glucose: 336 mg/dL — ABNORMAL HIGH (ref 70–99)
Potassium: 4.2 mmol/L (ref 3.5–5.2)
Sodium: 138 mmol/L (ref 134–144)
eGFR: 33 mL/min/{1.73_m2} — ABNORMAL LOW (ref >59)

## 2022-06-24 NOTE — Progress Notes (Signed)
Internal Medicine Clinic Attending  Case discussed with Dr. Howie Ill  At the time of the visit.  We reviewed the resident's history and exam and pertinent patient test results.  I agree with the assessment, diagnosis, and plan of care documented in the resident's note.    BMP stable, adding chlorthalidone for HTN today

## 2022-06-24 NOTE — Assessment & Plan Note (Signed)
Patient with history of type 2 diabetes.  Last hemoglobin A1c in May went down from 8.4-7.9.  She is having difficulty obtaining GLP-1 medication and greatly appreciate Ms. Camille Harrison's assistance with applying for program for Rybelsus.  She states that she has been taking metformin, empagliflozin and has been out of Rybelsus for the last 30 days.  She is checking her blood sugars daily and understands that this is not required, but likes to keep close track of her blood sugars.  Blood sugars have been staying in the 200s. A/P: Patient with history of difficulty obtaining medications.  She received Rybelsus medication while in clinic.  We will follow-up with patient in 2 weeks to see if blood sugars have improved with restarting Rybelsus and continuing metformin with empagliflozin.  I will touch base with Hortencia Pilar to see the best way of likely titrating Rybelsus up to 7 mg while making sure patient continues to receive medication. -Continue metformin, empagliflozin, Rybelsus 3 mg daily -We will touch base with Ms. Hairston about Novo nordisk application and titrating medications once that has been applied for. -f/u in 2 weeks, make sure patient has applied for nordisk program and titrate rybelsus is sugars remain elevated. -repeat HgbA1c in September

## 2022-06-24 NOTE — Assessment & Plan Note (Signed)
Patient presenting for follow-up from visit 2 weeks ago.  She was started on amlodipine in early June and developed diarrhea after this which resolved by discontinuing amlodipine.  Olmesartan dose was increased at last appointment.  She currently takes olmesartan 40 mg daily and reports adherence with this medication.  She checks her blood pressure daily at home and states that it is typically around 140s over 70s.  Blood pressure initially elevated to 170s over 90s, when this was rechecked improved to 153/91. A/P: Repeat BMP showed creatinine from 1.43-1.67.  Her baseline creatinine is around 1.4-1.6.  Potassium is within normal limits at 4.2. -Continue olmesartan 40 mg -Restart chlorthalidone 25 mg -Follow-up via telehealth in 2 weeks, will review BP log at that time

## 2022-06-24 NOTE — Assessment & Plan Note (Signed)
PHQ noted to be elevated at 8 today.  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  She is interested in therapy, but would like to avoid medications as she has tried Cymbalta in the past and did not find this to be helpful. A/P: Patient was given information for [facility] and instructed to call them to set up counseling.

## 2022-06-27 ENCOUNTER — Other Ambulatory Visit (HOSPITAL_COMMUNITY): Payer: Self-pay

## 2022-06-28 ENCOUNTER — Other Ambulatory Visit: Payer: Medicare Other

## 2022-07-01 ENCOUNTER — Ambulatory Visit (INDEPENDENT_AMBULATORY_CARE_PROVIDER_SITE_OTHER): Payer: Medicare Other

## 2022-07-01 DIAGNOSIS — Z0000 Encounter for general adult medical examination without abnormal findings: Secondary | ICD-10-CM | POA: Diagnosis not present

## 2022-07-01 NOTE — Progress Notes (Unsigned)
Subjective:   Amanda [name] is a 71 y.o. female who presents for Medicare Annual (Subsequent) preventive examination. I connected with  [name] on 07/01/22 by a audio enabled telemedicine application and verified that I am speaking with the correct person using two identifiers.  Patient Location: Home  Provider Location: Office/Clinic  I discussed the limitations of evaluation and management by telemedicine. The patient expressed understanding and agreed to proceed.  Review of Systems    Defer to PCP       Objective:    Today's Vitals   07/01/22 0842  PainSc: 8    There is no height or weight on file to calculate BMI.     06/23/2022    2:54 PM 05/27/2022   10:55 AM 04/12/2022    9:48 AM 02/16/2022    7:05 AM 02/10/2022    8:21 AM 02/03/2022    1:47 PM 01/27/2022   10:30 AM  Advanced Directives  Does Patient Have a Medical Advance Directive? No No No No No No No  Would patient like information on creating a medical advance directive? No - Patient declined No - Patient declined Yes (MAU/Ambulatory/Procedural Areas - Information given) No - Patient declined No - Patient declined No - Patient declined Yes (ED - Information included in AVS)    Current Medications (verified) Outpatient Encounter Medications as of 07/01/2022  Medication Sig   acetaminophen (TYLENOL) 325 MG tablet Take 2 tablets (650 mg total) by mouth every 6 (six) hours as needed (or Fever >/= 101).   atorvastatin (LIPITOR) 40 MG tablet Take 1 tablet (40 mg total) by mouth daily.   blood glucose meter kit and supplies KIT Dispense based on patient and insurance preference. Use up to four times daily as directed. .   cetirizine (ZYRTEC) 5 MG tablet Take 1 tablet (5 mg total) by mouth daily. (Patient taking differently: Take 5 mg by mouth daily as needed.)   chlorthalidone (HYGROTON) 25 MG tablet Take 1 tablet (25 mg total) by mouth daily.   diclofenac Sodium (VOLTAREN) 1 % GEL Apply 2 g topically daily as  needed (pain).   empagliflozin (JARDIANCE) 10 MG TABS tablet Take 1 tablet (10 mg total) by mouth daily before breakfast.   fluticasone (FLONASE) 50 MCG/ACT nasal spray Place 1 spray into both nostrils daily.   glucose blood test strip You do not medically need to check blood sugar while not taking insulin. You can check your blood sugar if you are concerned when you want, but do not need to   latanoprost (XALATAN) 0.005 % ophthalmic solution Place 1 drop into both eyes at bedtime.   metFORMIN (GLUCOPHAGE-XR) 500 MG 24 hr tablet Take 1 tablet (500 mg total) by mouth 2 (two) times daily with a meal.   Multiple Vitamins-Minerals (WOMENS 50+ MULTI VITAMIN/MIN) TABS Take 1 tablet by mouth daily.   olmesartan (BENICAR) 40 MG tablet Take 1 tablet (40 mg total) by mouth daily.   Semaglutide (RYBELSUS) 3 MG TABS Take 1 tablet by mouth daily 30 minutes before eating or taking other medications.   [DISCONTINUED] glipiZIDE (GLUCOTROL) 5 MG tablet Take 0.5 tablets (2.5 mg total) by mouth daily before breakfast.   No facility-administered encounter medications on file as of 07/01/2022.    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 Colonoscopy January 2020 was negative for microscopic colitis and other causes. Likely medication-related vs. IBS. GI doing a work up with celiac labs,  fecal pancreatic elastase and SIBO breath test.   EGD 06/07/21 biopsy consistent with H. pylori   CKD (chronic kidney disease) stage 3, GFR 30-59 ml/min ([place]) 01/01/2021   Diabetes mellitus 1996   DKA (diabetic ketoacidoses) 01/02/2013   Family history  of adverse reaction to anesthesia 01/02/2013   Mother was slow to wake up after a procedure   Gait abnormality 01/10/2019   Glaucoma    Hyperlipidemia    Hypertension    Hypotension    Influenza A (H1N1) 01/03/2013   Nail fungus 10/23/2017   Need for shingles vaccine 10/28/2021   Neuromuscular disorder ([place])    NEUROPATHY   Normocytic anemia 02/14/2013   Osteopenia 05/04/2016   Pain of both shoulder joints 11/14/2016   Posterior capsular opacification of left eye, obscuring vision 02/15/2016   Screening for osteoporosis 10/28/2021   Sinusitis 04/29/2021   SYNCOPE 05/10/2010   Qualifier: Diagnosis of  By: Marilynne Halsted, RN, BSN, Jacquelyn     Viral upper respiratory infection 12/23/2021   Vitreous hemorrhage of left eye ([place]) 05/13/2016   Past Surgical History:  Procedure Laterality Date   ABDOMINAL HYSTERECTOMY     10 years ago    BREAST BIOPSY     BREAST LUMPECTOMY WITH RADIOACTIVE SEED LOCALIZATION Right 02/16/2022   Procedure: RIGHT BREAST LUMPECTOMY WITH RADIOACTIVE SEED LOCALIZATION;  Surgeon: Erroll Luna, MD;  Location: [facility] OR;  Service: General;  Laterality: Right;   Cataracts     ROTATOR CUFF REPAIR     SHOULDER SURGERY     Rotator cuff tear    Family History  Problem Relation Age of Onset   Diabetes Mother    Hypertension Mother    Breast cancer Mother    Cancer Mother    Diabetes Sister    Hypertension Sister    Kidney disease Sister        One Kidney transplant, one sister on HD   Breast cancer Sister    Cancer Sister    Hypertension Sister    Diabetes Sister    Cerebral palsy Brother    Cancer Brother        Unknown   Hypertension Daughter    Colon cancer Neg Hx    Esophageal cancer Neg Hx    Stomach cancer Neg Hx    Rectal cancer Neg Hx    Social History   Socioeconomic History   Marital status: Married    Spouse name: Not on file   Number of children: 1   Years of education: Not on file   Highest education level: Not on file  Occupational History    Occupation: Retired    Comment: Printmaker  Tobacco Use   Smoking status: Never   Smokeless tobacco: Never  Vaping Use   Vaping Use: Never used  Substance and Sexual Activity   Alcohol use: No    Alcohol/week: 0.0 standard drinks of alcohol   Drug use: No   Sexual activity: Not Currently    Birth control/protection: Surgical  Other Topics Concern   Not on file  Social History Narrative   Current Social History 04/23/2021  Patient lives with husband in a one level home with ramp      Patient's method of transportation is personal car shared with her mother.      The highest level of education was high school diploma.      The patient currently retired from [employer].   Social Determinants of Health   Financial Resource Strain: Medium Risk (07/01/2022)   Overall Financial Resource Strain (CARDIA)    Difficulty of Paying Living Expenses: Somewhat hard  Food Insecurity: No Food Insecurity (07/01/2022)   Hunger Vital Sign    Worried About Running Out of Food in the Last Year: Never true    Ran Out of Food in the Last Year: Never true  Transportation Needs: No Transportation Needs (07/01/2022)   PRAPARE - Hydrologist (Medical): No    Lack of Transportation (Non-Medical): No  Physical Activity: Inactive (07/01/2022)   Exercise Vital Sign    Days of Exercise per Week: 0 days    Minutes of Exercise per Session: 0 min  Stress: No Stress Concern Present (07/01/2022)   [place]    Feeling of Stress : Not at all  Social Connections: [place] (07/01/2022)   Social Connection and Isolation Panel [NHANES]    Frequency of Communication with Friends and Family: More than three times a week    Frequency of Social Gatherings with Friends and Family: More than three times a week    Attends Religious Services: More than 4 times per year    Active Member of [employer] or  Organizations: Yes    Attends Music therapist: More than 4 times per year    Marital Status: Married    Tobacco Counseling Counseling given: Not Answered   Clinical Intake:  Pre-visit preparation completed: Yes  Pain : 0-10 Pain Score: 8  Pain Type: Chronic pain Pain Location: Back Pain Orientation: Lower Pain Descriptors / Indicators: Constant Pain Frequency: Constant Pain Relieving Factors: tylenol  Pain Relieving Factors: tylenol  Nutritional Risks: None Diabetes: Yes  How often do you need to have someone help you when you read instructions, pamphlets, or other written materials from your doctor or pharmacy?: 1 - Never What is the last grade level you completed in school?: 12th grade  Diabetic?Yes  Interpreter Needed?: No  Information entered by :: Corey Skains Americo Vallery,cma 07/01/22 8:43am   Activities of Daily Living    07/01/2022    8:54 AM 06/23/2022    2:54 PM  In your present state of health, do you have any difficulty performing the following activities:  Hearing? 0 0  Vision? 0 0  Difficulty concentrating or making decisions? 0 1  Walking or climbing stairs? 1 1  Comment tiredness   Dressing or bathing? 0 0  Doing errands, shopping? 0 0    Patient Care Team: [name], Joellen Jersey, DO as PCP - General Hairston, Talbert Cage, [name] (Pharmacy Technician) Marzetta Board, DPM as Consulting Physician (Podiatry) Feliz Beam, MD as Referring Physician (Ophthalmology) Milus Banister, MD as Attending Physician (Gastroenterology)  Indicate any recent Medical Services you may have received from other than [facility] providers in the past year (date may be approximate).     Assessment:   This is a routine wellness examination for Amanda [name].  Hearing/Vision screen No results found.  Dietary issues and exercise activities discussed:     Goals Addressed   None   Depression Screen  07/01/2022    8:50 AM 06/23/2022    3:03 PM 05/27/2022   10:54 AM 05/17/2022     2:15 PM 04/12/2022    9:49 AM 03/16/2022    2:17 PM 02/03/2022    2:51 PM  PHQ 2/9 Scores  PHQ - 2 Score 0 2 0 0 0 1 0  PHQ- 9 Score '6 6     1    ' Fall Risk    07/01/2022    8:49 AM 06/23/2022    2:54 PM 05/27/2022   10:55 AM 05/17/2022    2:15 PM 04/12/2022    9:49 AM  Fall Risk   Falls in the past year? 0 0 0 0 0  Number falls in past yr: 0 0     Injury with Fall? 0 0     Risk for fall due to : No Fall Risks No Fall Risks No Fall Risks No Fall Risks No Fall Risks  Follow up Falls evaluation completed;Falls prevention discussed Falls evaluation completed;Falls prevention discussed Falls evaluation completed Falls evaluation completed Falls evaluation completed    FALL RISK PREVENTION PERTAINING TO THE HOME:  Any stairs in or around the home? Yes  If so, are there any without handrails? No  Home free of loose throw rugs in walkways, pet beds, electrical cords, etc? Yes  Adequate lighting in your home to reduce risk of falls? Yes   ASSISTIVE DEVICES UTILIZED TO PREVENT FALLS:  Life alert? No  Use of a cane, walker or w/c? No  Grab bars in the bathroom? No  Shower chair or bench in shower? No  Elevated toilet seat or a handicapped toilet? No   TIMED UP AND GO:  Was the test performed? No .  Length of time to ambulate 10 feet: N/A sec.   Gait slow and steady without use of assistive device  Cognitive Function:        07/01/2022    8:55 AM  6CIT Screen  What Year? 0 points  What month? 0 points  What time? 0 points  Count back from 20 0 points  Months in reverse 0 points  Repeat phrase 0 points  Total Score 0 points    Immunizations Immunization History  Administered Date(s) Administered   Fluad Quad(high Dose 65+) 09/22/2019, 09/02/2021   Influenza, Seasonal, Injecte, Preservative Fre 02/14/2013   Influenza,inj,Quad PF,6+ Mos 02/19/2014, 09/16/2014, 10/27/2015, 08/15/2016, 01/29/2018, 08/28/2018, 10/12/2020   Moderna Sars-Covid-2 Vaccination 02/27/2020,  03/30/2020   Pneumococcal Conjugate-13 05/07/2018   Pneumococcal Polysaccharide-23 02/14/2013, 11/11/2019   Tdap 02/19/2014    TDAP status: Up to date  Flu Vaccine status: Up to date  Pneumococcal vaccine status: Up to date  Covid-19 vaccine status: Completed vaccines  Qualifies for Shingles Vaccine? No   Zostavax completed No   Shingrix Completed?: No.    Education has been provided regarding the importance of this vaccine. Patient has been advised to call insurance company to determine out of pocket expense if they have not yet received this vaccine. Advised may also receive vaccine at local pharmacy or Health Dept. Verbalized acceptance and understanding.  Screening Tests Health Maintenance  Topic Date Due   Zoster Vaccines- Shingrix (1 of 2) Never done   COVID-19 Vaccine (3 - Moderna risk series) 04/27/2020   INFLUENZA VACCINE  07/19/2022   HEMOGLOBIN A1C  08/17/2022   FOOT EXAM  03/17/2023   MAMMOGRAM  03/31/2023   OPHTHALMOLOGY EXAM  04/07/2023   TETANUS/TDAP  02/20/2024   COLONOSCOPY (Pts 45-60yr Insurance  coverage will need to be confirmed)  01/11/2029   Pneumonia Vaccine 50+ Years old  Completed   DEXA SCAN  Completed   Hepatitis C Screening  Completed   HPV VACCINES  Aged Out    Health Maintenance  Health Maintenance Due  Topic Date Due   Zoster Vaccines- Shingrix (1 of 2) Never done   COVID-19 Vaccine (3 - Moderna risk series) 04/27/2020    Colorectal cancer screening: Type of screening: Colonoscopy. Completed 01/11/2019. Repeat every 10 years  Mammogram status: Completed 03/30/2021. Repeat every year:2  Bone Density status: Completed 02/28/2022. Results reflect: Bone density results: OSTEOPENIA. Repeat every 0 years.  Lung Cancer Screening: (Low Dose CT Chest recommended if Age 87-80 years, 30 pack-year currently smoking OR have quit w/in 15years.) does not qualify.   Lung Cancer Screening Referral: N/A  Additional Screening:  Hepatitis C Screening:  does not qualify; Completed 11/24/2015  Vision Screening: Recommended annual ophthalmology exams for early detection of glaucoma and other disorders of the eye. Is the patient up to date with their annual eye exam?  Yes  Who is the provider or what is the name of the office in which the patient attends annual eye exams? [facility] [address]  If pt is not established with a provider, would they like to be referred to a provider to establish care? No .   Dental Screening: Recommended annual dental exams for proper oral hygiene  Community Resource Referral / Chronic Care Management: CRR required this visit?  No   CCM required this visit?  No      Plan:     I have personally reviewed and noted the following in the patient's chart:   Medical and social history Use of alcohol, tobacco or illicit drugs  Current medications and supplements including opioid prescriptions.  Functional ability and status Nutritional status Physical activity Advanced directives List of other physicians Hospitalizations, surgeries, and ER visits in previous 12 months Vitals Screenings to include cognitive, depression, and falls Referrals and appointments  In addition, I have reviewed and discussed with patient certain preventive protocols, quality metrics, and best practice recommendations. A written personalized care plan for preventive services as well as general preventive health recommendations were provided to patient.     Kerin Perna, [facility]   07/01/2022   Nurse Notes: Non Face-to-Face 17 minute visit  Amanda [name] , Thank you for taking time to come for your Medicare Wellness Visit. I appreciate your ongoing commitment to your health goals. Please review the following plan we discussed and let me know if I can assist you in the future.   These are the goals we discussed:  Goals      Blood Pressure < 140/90     BP Readings from Last 3 Encounters:  09/14/20 (!) 199/97  07/03/20 (!) 177/87   06/10/20 (!) 152/84    Not meeting blood pressure targets      Have 3 meals a day     HEMOGLOBIN A1C < 7     Lab Results  Component Value Date   HGBA1C 7.9 (A) 07/03/2020   Not  meeting Hgb A1C target.     LDL CALC < 100     Lab Results  Component Value Date   CHOL 127 09/10/2019   HDL 54 09/10/2019   LDLCALC 54 09/10/2019   TRIG 115 09/13/2019   CHOLHDL 2.4 09/10/2019    Meeting lipid targets         This is a list  of the screening recommended for you and due dates:  Health Maintenance  Topic Date Due   Zoster (Shingles) Vaccine (1 of 2) Never done   COVID-19 Vaccine (3 - Moderna risk series) 04/27/2020   Flu Shot  07/19/2022   Hemoglobin A1C  08/17/2022   Complete foot exam   03/17/2023   Mammogram  03/31/2023   Eye exam for diabetics  04/07/2023   Tetanus Vaccine  02/20/2024   Colon Cancer Screening  01/11/2029   Pneumonia Vaccine  Completed   DEXA scan (bone density measurement)  Completed   Hepatitis C Screening: USPSTF Recommendation to screen - Ages 7-79 yo.  Completed   HPV Vaccine  Aged Out

## 2022-07-04 ENCOUNTER — Telehealth: Payer: Self-pay

## 2022-07-04 NOTE — Telephone Encounter (Signed)
Called pt - informed to bring paperwork and proof of income. Stated she will be here Wed 7/19 @ 10AM to see [place].

## 2022-07-04 NOTE — Telephone Encounter (Signed)
Patient called she is requesting to come in on Wednesday to review the paperwork she received from [place]

## 2022-07-06 ENCOUNTER — Other Ambulatory Visit (HOSPITAL_COMMUNITY): Payer: Self-pay

## 2022-07-06 ENCOUNTER — Telehealth: Payer: Self-pay

## 2022-07-06 NOTE — Telephone Encounter (Signed)
Pt came by office to return paperwork for Rybelsus medication.  Pt plans to reach out to [facility] about Low Income Subsidy/Extra Help so we can move forward with [facility] CARES application for Jardiance if necessary.  Gave pt 2 more weeks worth of Jardiance samples:  Medication Samples have been provided to the patient.  Drug name: JARDIANCE       Strength: '10MG'$         Qty: 14  LOT: [id number]  Exp.Date: 05/2023  Dosing instructions: TAKE 1 DAILY BEFORE BREAKFAST  The patient has been instructed regarding the correct time, dose, and frequency of taking this medication.  Talbert Cage [place]anc 11:05 AM 07/06/2022

## 2022-07-07 ENCOUNTER — Ambulatory Visit (INDEPENDENT_AMBULATORY_CARE_PROVIDER_SITE_OTHER): Payer: Medicare Other | Admitting: Internal Medicine

## 2022-07-07 ENCOUNTER — Encounter: Payer: Self-pay | Admitting: Internal Medicine

## 2022-07-07 ENCOUNTER — Ambulatory Visit: Payer: Medicare Other

## 2022-07-07 DIAGNOSIS — I10 Essential (primary) hypertension: Secondary | ICD-10-CM

## 2022-07-07 NOTE — Progress Notes (Signed)
Patient is currently in ED with husband who is being admitted. I will ask front desk staff to reschedule her appointment to next week.

## 2022-07-12 ENCOUNTER — Ambulatory Visit (INDEPENDENT_AMBULATORY_CARE_PROVIDER_SITE_OTHER): Payer: Medicare Other | Admitting: Internal Medicine

## 2022-07-12 ENCOUNTER — Other Ambulatory Visit (HOSPITAL_COMMUNITY): Payer: Self-pay

## 2022-07-12 VITALS — BP 129/80 | HR 81 | Wt 123.3 lb

## 2022-07-12 DIAGNOSIS — F331 Major depressive disorder, recurrent, moderate: Secondary | ICD-10-CM | POA: Diagnosis not present

## 2022-07-12 DIAGNOSIS — Z794 Long term (current) use of insulin: Secondary | ICD-10-CM

## 2022-07-12 DIAGNOSIS — I10 Essential (primary) hypertension: Secondary | ICD-10-CM

## 2022-07-12 DIAGNOSIS — E1142 Type 2 diabetes mellitus with diabetic polyneuropathy: Secondary | ICD-10-CM | POA: Diagnosis not present

## 2022-07-12 MED ORDER — INSULIN GLARGINE 100 UNIT/ML SOLOSTAR PEN
12.0000 [IU] | PEN_INJECTOR | Freq: Every day | SUBCUTANEOUS | 3 refills | Status: DC
  Filled 2022-07-12: qty 3, 25d supply, fill #0
  Filled 2022-08-02: qty 3, 25d supply, fill #1
  Filled 2022-08-30: qty 3, 25d supply, fill #2
  Filled 2022-09-26: qty 3, 25d supply, fill #3

## 2022-07-12 MED ORDER — INSULIN GLARGINE 100 UNITS/ML SOLOSTAR PEN
12.0000 [IU] | PEN_INJECTOR | Freq: Every day | SUBCUTANEOUS | 11 refills | Status: DC
  Filled 2022-07-12: qty 3, 25d supply, fill #0

## 2022-07-12 MED ORDER — PEN NEEDLES 32G X 4 MM MISC
10 refills | Status: DC
  Filled 2022-07-12: qty 100, 100d supply, fill #0
  Filled 2022-09-13 – 2022-11-14 (×2): qty 100, 100d supply, fill #1
  Filled 2023-02-06 – 2023-02-14 (×2): qty 100, 100d supply, fill #2
  Filled 2023-06-20 – 2023-07-03 (×2): qty 100, 100d supply, fill #3

## 2022-07-12 MED ORDER — RYBELSUS 7 MG PO TABS: 7.0000 mg | ORAL_TABLET | Freq: Every day | ORAL | 11 refills | Status: DC

## 2022-07-12 NOTE — Assessment & Plan Note (Addendum)
Patient has chronic uncontrolled diabetes managed with Metformin '500mg'$  BID, empagliflozin '10mg'$ , and Rybelsus '3mg'$ . Patient reports that she has noticed some blurry vision and polyuria recently, which she attributes to high blood sugar. Patient reports that she has not had any significant changes in diet and generally eats 1-2 meals a day with some snacks. She has previously been on Lantus 4units BID but was taken off in June 2022 when a1C improved. On chart review, there is a recorded history of DKA in 2014 but no associated medical records and patient reports she does not recall any hospitalizations with DKA.   Patient's home blood sugar readings show morning fasting glucose averaging 351 and overall average of 339 and range 196-527. Last A1C was 7.9 in May.   A/P: Patient's glucose is poorly controlled and recommend adding long-acting insulin to regimen. Also will plan to increase Rybelsus dose. Patient has completed paperwork for Rybelsus application and expect to be approved and put on new dose soon. We discussed diet modification with smaller meals throughout the day and patient expressed understanding. We also discussed continuing to monitor fasting blood glucose for now and proposed using CGM in the future, which patient was amenable to.  -Continue Metformin '500mg'$  BID -Continue empagliflozin '10mg'$   -Increase Rybelsus to '7mg'$  -Start Lantus 12units QHS

## 2022-07-12 NOTE — Progress Notes (Signed)
Subjective:   Patient ID: Amanda [name] female   DOB: February 27, 1951 71 y.o.   MRN: [id number]  HPI: Amanda [name] is a 71 y.o. with a pmhx of uncontrolled HTN, uncontrolled T2DM, and Depression who presents for follow-up of her chronic conditions. Please see Plan for individualized problem-based charting.   Patient Active Problem List   Diagnosis Date Noted   Diabetic nephropathy with proteinuria ([place]) 05/18/2022   Headache 04/12/2022   Iron deficiency anemia 12/02/2021   Peripheral artery disease ([place]) 10/28/2021   Lump in lower outer quadrant of right breast 10/28/2021   Chronic kidney disease (CKD), stage III (moderate) ([place]) 01/01/2021   Osteopenia 05/04/2016   MDD (major depressive disorder), recurrent episode, moderate ([place]) 02/08/2016   Pseudophakia of both eyes 07/27/2015   Preventive measure 06/03/2013   Diabetic neuropathy ([place]) 05/20/2013   Seasonal allergies 03/12/2013   Dyslipidemia 02/15/2013   DM II (diabetes mellitus, type II), controlled ([place]) 02/14/2013   Essential hypertension 05/10/2010     Current Outpatient Medications  Medication Sig Dispense Refill   insulin glargine (LANTUS) 100 UNIT/ML Solostar Pen Inject 12 Units into the skin at bedtime. 15 mL 3   Insulin Pen Needle (PEN NEEDLES) 32G X 4 MM MISC Use as directed. 100 each 10   Semaglutide (RYBELSUS) 7 MG TABS Take 7 mg by mouth daily. 30 tablet 11   acetaminophen (TYLENOL) 325 MG tablet Take 2 tablets (650 mg total) by mouth every 6 (six) hours as needed (or Fever >/= 101). 60 tablet 0   atorvastatin (LIPITOR) 40 MG tablet Take 1 tablet (40 mg total) by mouth daily. 90 tablet 2   blood glucose meter kit and supplies KIT Dispense based on patient and insurance preference. Use up to four times daily as directed. . 1 each 0   cetirizine (ZYRTEC) 5 MG tablet Take 1 tablet (5 mg total) by mouth daily. (Patient taking differently: Take 5 mg by mouth daily as needed.) 30 tablet 1   chlorthalidone  (HYGROTON) 25 MG tablet Take 1 tablet (25 mg total) by mouth daily. 30 tablet 2   diclofenac Sodium (VOLTAREN) 1 % GEL Apply 2 g topically daily as needed (pain).     empagliflozin (JARDIANCE) 10 MG TABS tablet Take 1 tablet (10 mg total) by mouth daily before breakfast. 90 tablet 2   fluticasone (FLONASE) 50 MCG/ACT nasal spray Place 1 spray into both nostrils daily. 16 g 2   glucose blood test strip You do not medically need to check blood sugar while not taking insulin. You can check your blood sugar if you are concerned when you want, but do not need to 100 each 12   latanoprost (XALATAN) 0.005 % ophthalmic solution Place 1 drop into both eyes at bedtime.     metFORMIN (GLUCOPHAGE-XR) 500 MG 24 hr tablet Take 1 tablet (500 mg total) by mouth 2 (two) times daily with a meal. 90 tablet 3   Multiple Vitamins-Minerals (WOMENS 50+ MULTI VITAMIN/MIN) TABS Take 1 tablet by mouth daily. 30 tablet    olmesartan (BENICAR) 40 MG tablet Take 1 tablet (40 mg total) by mouth daily. 30 tablet 2   No current facility-administered medications for this visit.     Review of Systems: A comprehensive review of systems was negative except for: what is noted in problem-based charting.   Objective:   Physical Exam: Vitals:   07/12/22 1412 07/12/22 1452  BP: (!) 143/78 129/80  Pulse: 90 81  SpO2: 100%   Weight: 123 lb 4.8 oz (55.9 kg)    Physical Exam Vitals reviewed.  Constitutional:      General: She is not in acute distress.    Appearance: Normal appearance.  HENT:     Head: Normocephalic and atraumatic.  Cardiovascular:     Rate and Rhythm: Normal rate and regular rhythm.     Heart sounds: No murmur heard. Pulmonary:     Effort: Pulmonary effort is normal. No respiratory distress.     Breath sounds: Normal breath sounds.  Abdominal:     General: Abdomen is flat. There is no distension.     Palpations: Abdomen is soft.     Tenderness: There is no abdominal tenderness.  Skin:    General:  Skin is warm and dry.  Neurological:     Mental Status: She is alert and oriented to person, place, and time.      Assessment & Plan:   Essential hypertension Patient has chronic uncontrolled hypertension managed with Olmesartan 4m and Chlorthalidone 21m Patient reports that she has been feeling lightheaded since starting chlorthalidone 2 weeks ago and has stopped taking it for the last five days due to side effects. She reports that her home BP readings are occasionally under 10173ystolic and she feels dizzy when it is that low.  Her BP in office today was initially 143/78, then 129/80 on repeat. Her last BMP showed creatinine=1.67.   A/P: Given side effects and that her repeat BP in office was within goal range while off chlorthalidone, recommend discontinuing chlorthalidone today.  -Continue olmesartan 4039mDiscontinue chlorthalidone 61m78mepeat BMP today -Follow up in 2 weeks     DM II (diabetes mellitus, type II), controlled (HCC)Pearsonvilletient has chronic uncontrolled diabetes managed with Metformin 500mg70m, empagliflozin 10mg,70m Rybelsus 70mg. P53ment reports that she has noticed some blurry vision and polyuria recently, which she attributes to high blood sugar. Patient reports that she has not had any significant changes in diet and generally eats 1-2 meals a day with some snacks. She has previously been on Lantus 4units BID but was taken off in June 2022 when a1C improved. On chart review, there is a recorded history of DKA in 2014 but no associated medical records and patient reports she does not recall any hospitalizations with DKA.   Patient's home blood sugar readings show morning fasting glucose averaging 351 and overall average of 339 and range 196-527. Last A1C was 7.9 in May.   A/P: Patient's glucose is poorly controlled and recommend adding long-acting insulin to regimen. Also will plan to increase Rybelsus dose. Patient has completed paperwork for Rybelsus  application and expect to be approved and put on new dose soon. We discussed diet modification with smaller meals throughout the day and patient expressed understanding. We also discussed continuing to monitor fasting blood glucose for now and proposed using CGM in the future, which patient was amenable to.  -Continue Metformin 500mg BI31montinue empagliflozin 10mg  -I73mase Rybelsus to 7mg -Star8mantus 12units QHS   MDD (major depressive disorder), recurrent episode, moderate (HCC) Patient has chronic stable depression. Patient reports feeling stressed due to her husband's health. PHQ-9 today was 4.  A/P: Patientis still interested in counseling and is planning to schedule an appointment with [facility].

## 2022-07-12 NOTE — Patient Instructions (Addendum)
Thank you, Ms.Shanikwa R Montee for allowing [place] to provide your care today. Today we discussed.   Diabetes Your blood sugars are very high.  You are currently on next dose of metformin with your kidney function, next dose of empagliflozin, and we are titrating your Rybelsus up.  Today we are starting you on long-acting insulin.  Please take Lantus 12 units nightly.  Check your blood sugars in the morning before eating and please bring glucometer back in when you follow-up in 2 weeks.  We may consider doing a continuous glucose monitor in the future.  Blood pressure Please stop taking chlorthalidone.  Blood pressure seems well controlled while taking olmesartan 40 mg.  Continue checking blood pressure once daily and bring your log in when he follow-up in 2 weeks.  For counseling please called [facility] at [phone].  I have ordered the following labs for you:   Lab Orders         BMP8+Anion Gap       Referrals ordered today:   Referral Orders  No referral(s) requested today     I have ordered the following medication/changed the following medications:   Stop the following medications: Medications Discontinued During This Encounter  Medication Reason   Semaglutide (RYBELSUS) 3 MG TABS Change in therapy     Start the following medications: Meds ordered this encounter  Medications   Semaglutide (RYBELSUS) 7 MG TABS    Sig: Take 7 mg by mouth daily.    Dispense:  30 tablet    Refill:  11     Follow up:  2 weeks     We look forward to seeing you next time. Please call our clinic at [phone] if you have any questions or concerns. The best time to call is Monday-Friday from 9am-4pm, but there is someone available 24/7. If after hours or the weekend, call the main hospital number and ask for the Internal Medicine Resident On-Call. If you need medication refills, please notify your pharmacy one week in advance and they will send [place] a request.   Thank you for  trusting me with your care. Wishing you the best!   Christiana Fuchs, [place]

## 2022-07-12 NOTE — Assessment & Plan Note (Signed)
Patient has chronic uncontrolled hypertension managed with Olmesartan '40mg'$  and Chlorthalidone '25mg'$ . Patient reports that she has been feeling lightheaded since starting chlorthalidone 2 weeks ago and has stopped taking it for the last five days due to side effects. She reports that her home BP readings are occasionally under 482 systolic and she feels dizzy when it is that low.  Her BP in office today was initially 143/78, then 129/80 on repeat. Her last BMP showed creatinine=1.67.   A/P: Given side effects and that her repeat BP in office was within goal range while off chlorthalidone, recommend discontinuing chlorthalidone today.  -Continue olmesartan '40mg'$  -Discontinue chlorthalidone '25mg'$  -Repeat BMP today -Follow up in 2 weeks

## 2022-07-12 NOTE — Assessment & Plan Note (Signed)
Patient has chronic stable depression. Patient reports feeling stressed due to her husband's health. PHQ-9 today was 4.  A/P: Patientis still interested in counseling and is planning to schedule an appointment with [facility].

## 2022-07-13 ENCOUNTER — Telehealth: Payer: Self-pay

## 2022-07-13 LAB — BMP8+ANION GAP
Anion Gap: 20 mmol/L — ABNORMAL HIGH (ref 10.0–18.0)
BUN/Creatinine Ratio: 23 (ref 12–28)
BUN: 49 mg/dL — ABNORMAL HIGH (ref 8–27)
CO2: 19 mmol/L — ABNORMAL LOW (ref 20–29)
Calcium: 9.3 mg/dL (ref 8.7–10.3)
Chloride: 103 mmol/L (ref 96–106)
Creatinine, Ser: 2.12 mg/dL — ABNORMAL HIGH (ref 0.57–1.00)
Glucose: 283 mg/dL — ABNORMAL HIGH (ref 70–99)
Potassium: 5 mmol/L (ref 3.5–5.2)
Sodium: 142 mmol/L (ref 134–144)
eGFR: 25 mL/min/{1.73_m2} — ABNORMAL LOW (ref >59)

## 2022-07-13 NOTE — Telephone Encounter (Signed)
Submitted application for RYBELSUS 7MG to [employer] for patient assistance.   Phone: [phone]  

## 2022-07-14 NOTE — Progress Notes (Signed)
Internal Medicine Clinic Attending  Case discussed with Dr. [name]  At the time of the visit.  We reviewed the resident's history and exam and pertinent patient test results.  I agree with the assessment, diagnosis, and plan of care documented in the resident's note.  

## 2022-07-18 ENCOUNTER — Other Ambulatory Visit (HOSPITAL_COMMUNITY): Payer: Self-pay

## 2022-07-18 ENCOUNTER — Other Ambulatory Visit: Payer: Self-pay | Admitting: Internal Medicine

## 2022-07-18 DIAGNOSIS — Z794 Long term (current) use of insulin: Secondary | ICD-10-CM

## 2022-07-19 ENCOUNTER — Other Ambulatory Visit (HOSPITAL_COMMUNITY): Payer: Self-pay

## 2022-07-20 ENCOUNTER — Ambulatory Visit (INDEPENDENT_AMBULATORY_CARE_PROVIDER_SITE_OTHER): Payer: Medicare Other

## 2022-07-20 ENCOUNTER — Other Ambulatory Visit: Payer: Self-pay

## 2022-07-20 ENCOUNTER — Ambulatory Visit (INDEPENDENT_AMBULATORY_CARE_PROVIDER_SITE_OTHER): Payer: Medicare Other | Admitting: Student

## 2022-07-20 ENCOUNTER — Encounter: Payer: Self-pay | Admitting: Student

## 2022-07-20 ENCOUNTER — Other Ambulatory Visit (HOSPITAL_COMMUNITY): Payer: Self-pay

## 2022-07-20 VITALS — BP 141/72 | HR 84 | Temp 97.7°F | Ht 62.0 in | Wt 132.0 lb

## 2022-07-20 DIAGNOSIS — N1832 Chronic kidney disease, stage 3b: Secondary | ICD-10-CM | POA: Diagnosis not present

## 2022-07-20 DIAGNOSIS — E1122 Type 2 diabetes mellitus with diabetic chronic kidney disease: Secondary | ICD-10-CM

## 2022-07-20 DIAGNOSIS — I129 Hypertensive chronic kidney disease with stage 1 through stage 4 chronic kidney disease, or unspecified chronic kidney disease: Secondary | ICD-10-CM | POA: Diagnosis not present

## 2022-07-20 DIAGNOSIS — E1142 Type 2 diabetes mellitus with diabetic polyneuropathy: Secondary | ICD-10-CM | POA: Diagnosis not present

## 2022-07-20 DIAGNOSIS — Z0000 Encounter for general adult medical examination without abnormal findings: Secondary | ICD-10-CM

## 2022-07-20 DIAGNOSIS — Z794 Long term (current) use of insulin: Secondary | ICD-10-CM

## 2022-07-20 DIAGNOSIS — I10 Essential (primary) hypertension: Secondary | ICD-10-CM

## 2022-07-20 DIAGNOSIS — J302 Other seasonal allergic rhinitis: Secondary | ICD-10-CM

## 2022-07-20 MED ORDER — RYBELSUS 7 MG PO TABS
7.0000 mg | ORAL_TABLET | Freq: Every day | ORAL | 5 refills | Status: DC
Start: 2022-07-20 — End: 2022-09-02

## 2022-07-20 NOTE — Progress Notes (Signed)
   CC: Diabetes and blood pressure follow-up  HPI:  Amanda [name] is a 71 y.o. female with PMH below who presents to clinic to follow-up on her diabetes and blood pressure. Please see problem based charting for evaluation, assessment and plan.  Past Medical History:  Diagnosis Date   Allergy    SEASONAL   Arthritis    Cervical radiculopathy due to degenerative joint disease of spine 05/03/2017   CKD (chronic kidney disease) stage 3, GFR 30-59 ml/min ([place]) 01/01/2021   Diabetes mellitus 1996   Glaucoma    Hyperlipidemia    Hypertension    Normocytic anemia 02/14/2013   Osteopenia 05/04/2016   Pain of both shoulder joints 11/14/2016    Review of Systems:  Constitutional: Negative for fever or fatigue HEENT: Positive for nasal congestion and allergies Respiratory: Negative for shortness of breath MSK: Positive for joint pains Neuro: Negative for headache, numbness, tingling or weakness  Physical Exam: General: Pleasant, well-appearing elderly woman. No acute distress.  Cardiac: RRR. No murmurs, rubs or gallops. No LE edema Respiratory: Lungs CTAB. No wheezing or crackles. Abdominal: Soft, symmetric and non tender. Normal BS. Skin: Warm, dry and intact without rashes or lesions Extremities: No tenderness to palpation of the MCP, PIP or DIP joints. Radial and DP pulses 2+ and symmetric. Neuro: A&O x 3. Moves all extremities Psych: Appropriate mood and affect.  Vitals:   07/20/22 1003  BP: (!) 141/72  Pulse: 84  Temp: 97.7 F (36.5 C)  TempSrc: Oral  SpO2: 100%  Weight: 132 lb (59.9 kg)  Height: '5\' 2"'$  (1.575 m)    Assessment & Plan:   See Encounters Tab for problem based charting.  Patient discussed with Dr. Lockie Pares, MD, MPH

## 2022-07-20 NOTE — Progress Notes (Unsigned)
Error patient completed AWV 07/01/2022

## 2022-07-20 NOTE — Patient Instructions (Signed)
Thank you, Ms.Aloise R Bouwens for allowing [place] to provide your care today. Today we discussed your blood pressure, diabetes and chronic kidney disease. Your blood sugars are looking much better so continue taking your Lantus as prescribed.  I have ordered the following labs for you:  Lab Orders         BMP8+Anion Gap       I will call if any are abnormal. All of your labs can be accessed through "My Chart".  I have ordered the following medication/changed the following medications:  Increase Rybelsus from 3 mg to 7 mg daily  My Chart Access: [email or web address]  Please follow-up in 4 weeks for repeat A1c and blood pressure follow-up  Please make sure to arrive 15 minutes prior to your next appointment. If you arrive late, you may be asked to reschedule.    We look forward to seeing you next time. Please call our clinic at [phone] if you have any questions or concerns. The best time to call is Monday-Friday from 9am-4pm, but there is someone available 24/7. If after hours or the weekend, call the main hospital number and ask for the Internal Medicine Resident On-Call. If you need medication refills, please notify your pharmacy one week in advance and they will send [place] a request.   Thank you for letting us take part in your care. Wishing you the best!  Lacinda Axon, MD 07/20/2022, 10:31 AM IM Resident, PGY-3 [facility]aldo Milian 41:10

## 2022-07-20 NOTE — Progress Notes (Deleted)
Subjective:   Amanda [name] is a 71 y.o. female who presents for Medicare Annual (Subsequent) preventive examination. I connected with  [name] on 07/20/22 by a  Face-To-Face  enabled telemedicine application and verified that I am speaking with the correct person using two identifiers.  Patient Location: Other:  Office/Clinic  Provider Location: Office/Clinic  I discussed the limitations of evaluation and management by telemedicine. The patient expressed understanding and agreed to proceed.  Review of Systems    Defer to PCP       Objective:    There were no vitals filed for this visit. There is no height or weight on file to calculate BMI.     06/23/2022    2:54 PM 05/27/2022   10:55 AM 04/12/2022    9:48 AM 02/16/2022    7:05 AM 02/10/2022    8:21 AM 02/03/2022    1:47 PM 01/27/2022   10:30 AM  Advanced Directives  Does Patient Have a Medical Advance Directive? _0  No No  Would patient like information on creating a medical advance directive? No - Patient declined No - Patient declined Yes (MAU/Ambulatory/Procedural Areas - Information given) No - Patient declined No - Patient declined No - Patient declined Yes (ED - Information included in AVS)    Current Medications (verified) Outpatient Encounter Medications as of 07/20/2022  Medication Sig   acetaminophen (TYLENOL) 325 MG tablet Take 2 tablets (650 mg total) by mouth every 6 (six) hours as needed (or Fever >/= 101).   atorvastatin (LIPITOR) 40 MG tablet Take 1 tablet (40 mg total) by mouth daily.   blood glucose meter kit and supplies KIT Dispense based on patient and insurance preference. Use up to four times daily as directed. .   cetirizine (ZYRTEC) 5 MG tablet Take 1 tablet (5 mg total) by mouth daily. (Patient taking differently: Take 5 mg by mouth daily as needed.)   chlorthalidone (HYGROTON) 25 MG tablet Take 1 tablet (25 mg total) by mouth daily.   diclofenac Sodium (VOLTAREN) 1 % GEL Apply 2 g  topically daily as needed (pain).   empagliflozin (JARDIANCE) 10 MG TABS tablet Take 1 tablet (10 mg total) by mouth daily before breakfast.   fluticasone (FLONASE) 50 MCG/ACT nasal spray Place 1 spray into both nostrils daily.   glucose blood test strip You do not medically need to check blood sugar while not taking insulin. You can check your blood sugar if you are concerned when you want, but do not need to   insulin glargine (LANTUS) 100 UNIT/ML Solostar Pen Inject 12 Units into the skin at bedtime.   Insulin Pen Needle (PEN NEEDLES) 32G X 4 MM MISC Use as directed.   latanoprost (XALATAN) 0.005 % ophthalmic solution Place 1 drop into both eyes at bedtime.   metFORMIN (GLUCOPHAGE-XR) 500 MG 24 hr tablet Take 1 tablet (500 mg total) by mouth 2 (two) times daily with a meal.   Multiple Vitamins-Minerals (WOMENS 50+ MULTI VITAMIN/MIN) TABS Take 1 tablet by mouth daily.   olmesartan (BENICAR) 40 MG tablet Take 1 tablet (40 mg total) by mouth daily.   Semaglutide (RYBELSUS) 7 MG TABS Take 7 mg by mouth daily.   [DISCONTINUED] glipiZIDE (GLUCOTROL) 5 MG tablet Take 0.5 tablets (2.5 mg total) by mouth daily before breakfast.   No facility-administered encounter medications on file as of 07/20/2022.    Allergies (verified) Gabapentin   History: Past Medical History:  Diagnosis Date   Allergy  SEASONAL   Arthritis    Cervical radiculopathy due to degenerative joint disease of spine 05/03/2017   CKD (chronic kidney disease) stage 3, GFR 30-59 ml/min ([place]) 01/01/2021   Diabetes mellitus 1996   Glaucoma    Hyperlipidemia    Hypertension    Normocytic anemia 02/14/2013   Osteopenia 05/04/2016   Pain of both shoulder joints 11/14/2016   Past Surgical History:  Procedure Laterality Date   ABDOMINAL HYSTERECTOMY     10 years ago    BREAST BIOPSY     BREAST LUMPECTOMY WITH RADIOACTIVE SEED LOCALIZATION Right 02/16/2022   Procedure: RIGHT BREAST LUMPECTOMY WITH RADIOACTIVE SEED LOCALIZATION;   Surgeon: Erroll Luna, MD;  Location: [facility];  Service: General;  Laterality: Right;   Cataracts     ROTATOR CUFF REPAIR     SHOULDER SURGERY     Rotator cuff tear    Family History  Problem Relation Age of Onset   Diabetes Mother    Hypertension Mother    Breast cancer Mother    Cancer Mother    Diabetes Sister    Hypertension Sister    Kidney disease Sister        One Kidney transplant, one sister on HD   Breast cancer Sister    Cancer Sister    Hypertension Sister    Diabetes Sister    Cerebral palsy Brother    Cancer Brother        Unknown   Hypertension Daughter    Colon cancer Neg Hx    Esophageal cancer Neg Hx    Stomach cancer Neg Hx    Rectal cancer Neg Hx    Social History   Socioeconomic History   Marital status: Married    Spouse name: Not on file   Number of children: 1   Years of education: Not on file   Highest education level: Not on file  Occupational History   Occupation: Retired    Comment: Printmaker  Tobacco Use   Smoking status: Never   Smokeless tobacco: Never  Vaping Use   Vaping Use: Never used  Substance and Sexual Activity   Alcohol use: No    Alcohol/week: 0.0 standard drinks of alcohol   Drug use: No   Sexual activity: Not Currently    Birth control/protection: Surgical  Other Topics Concern   Not on file  Social History Narrative   Current Social History 04/23/2021        Patient lives with husband in a one level home with ramp      Patient's method of transportation is personal car shared with her mother.      The highest level of education was high school diploma.      The patient currently retired from [employer].   Social Determinants of Health   Financial Resource Strain: Medium Risk (07/01/2022)   Overall Financial Resource Strain (CARDIA)    Difficulty of Paying Living Expenses: Somewhat hard  Food Insecurity: No Food Insecurity (07/01/2022)   Hunger Vital Sign    Worried About Running Out of Food in the  Last Year: Never true    Ran Out of Food in the Last Year: Never true  Transportation Needs: No Transportation Needs (07/01/2022)   PRAPARE - Hydrologist (Medical): No    Lack of Transportation (Non-Medical): No  Physical Activity: Inactive (07/01/2022)   Exercise Vital Sign    Days of Exercise per Week: 0 days    Minutes  of Exercise per Session: 0 min  Stress: No Stress Concern Present (07/01/2022)   [place]    Feeling of Stress : Not at all  Social Connections: [place] (07/01/2022)   Social Connection and Isolation Panel [NHANES]    Frequency of Communication with Friends and Family: More than three times a week    Frequency of Social Gatherings with Friends and Family: More than three times a week    Attends Religious Services: More than 4 times per year    Active Member of [employer] or Organizations: Yes    Attends Music therapist: More than 4 times per year    Marital Status: Married    Tobacco Counseling Counseling given: Not Answered   Clinical Intake:                 Diabetic?Yes         Activities of Daily Living    07/01/2022    8:54 AM 06/23/2022    2:54 PM  In your present state of health, do you have any difficulty performing the following activities:  Hearing? 0 0  Vision? 0 0  Difficulty concentrating or making decisions? 0 1  Walking or climbing stairs? 1 1  Comment tiredness   Dressing or bathing? 0 0  Doing errands, shopping? 0 0    Patient Care Team: [name], Joellen Jersey, DO as PCP - General Hairston, Talbert Cage, [name] (Pharmacy Technician) Marzetta Board, DPM as Consulting Physician (Podiatry) Feliz Beam, MD as Referring Physician (Ophthalmology) Milus Banister, MD as Attending Physician (Gastroenterology)  Indicate any recent Medical Services you may have received from other than [facility] providers in the past year  (date may be approximate).     Assessment:   This is a routine wellness examination for Harper.  Hearing/Vision screen No results found.  Dietary issues and exercise activities discussed:     Goals Addressed   None   Depression Screen    07/12/2022    3:36 PM 07/01/2022    8:50 AM 06/23/2022    3:03 PM 05/27/2022   10:54 AM 05/17/2022    2:15 PM 04/12/2022    9:49 AM 03/16/2022    2:17 PM  PHQ 2/9 Scores  PHQ - 2 Score 0 0 2 0 0 0 1  PHQ- 9 Score _0 Fall Risk    07/01/2022    8:49 AM 06/23/2022    2:54 PM 05/27/2022   10:55 AM 05/17/2022    2:15 PM 04/12/2022    9:49 AM  Fall Risk   Falls in the past year? 0 0 0 0 0  Number falls in past yr: 0 0     Injury with Fall? 0 0     Risk for fall due to : _1   Follow up Falls evaluation completed;Falls prevention discussed Falls evaluation completed;Falls prevention discussed Falls evaluation completed Falls evaluation completed Falls evaluation completed    FALL RISK PREVENTION PERTAINING TO THE HOME:  Any stairs in or around the home? {YES/NO:21197} If so, are there any without handrails? {YES/NO:21197} Home free of loose throw rugs in walkways, pet beds, electrical cords, etc? {YES/NO:21197} Adequate lighting in your home to reduce risk of falls? {YES/NO:21197}  ASSISTIVE DEVICES UTILIZED TO PREVENT FALLS:  Life alert? {YES/NO:21197} Use of a cane, walker or w/c? {  YES/NO:21197} Grab bars in the bathroom? {YES/NO:21197} Shower chair or bench in shower? {YES/NO:21197} Elevated toilet seat or a handicapped toilet? {YES/NO:21197}  TIMED UP AND GO:  Was the test performed? {YES/NO:21197}.  Length of time to ambulate 10 feet: *** sec.   {Appearance of WERX:5400867}  Cognitive Function:        07/01/2022    8:55 AM  6CIT Screen  What Year? 0 points  What month? 0 points  What time? 0 points  Count back from 20 0 points  Months in reverse 0  points  Repeat phrase 0 points  Total Score 0 points    Immunizations Immunization History  Administered Date(s) Administered   Fluad Quad(high Dose 65+) 09/22/2019, 09/02/2021   Influenza, Seasonal, Injecte, Preservative Fre 02/14/2013   Influenza,inj,Quad PF,6+ Mos 02/19/2014, 09/16/2014, 10/27/2015, 08/15/2016, 01/29/2018, 08/28/2018, 10/12/2020   Moderna Sars-Covid-2 Vaccination 02/27/2020, 03/30/2020   Pneumococcal Conjugate-13 05/07/2018   Pneumococcal Polysaccharide-23 02/14/2013, 11/11/2019   Tdap 02/19/2014    TDAP status: Up to date  Flu Vaccine status: Due, Education has been provided regarding the importance of this vaccine. Advised may receive this vaccine at local pharmacy or Health Dept. Aware to provide a copy of the vaccination record if obtained from local pharmacy or Health Dept. Verbalized acceptance and understanding.  Pneumococcal vaccine status: Up to date  Covid-19 vaccine status: Completed vaccines  Qualifies for Shingles Vaccine? No   Zostavax completed No   Shingrix Completed?: No.    Education has been provided regarding the importance of this vaccine. Patient has been advised to call insurance company to determine out of pocket expense if they have not yet received this vaccine. Advised may also receive vaccine at local pharmacy or Health Dept. Verbalized acceptance and understanding.  Screening Tests Health Maintenance  Topic Date Due   Zoster Vaccines- Shingrix (1 of 2) Never done   COVID-19 Vaccine (3 - Moderna risk series) 04/27/2020   INFLUENZA VACCINE  07/19/2022   HEMOGLOBIN A1C  08/17/2022   FOOT EXAM  03/17/2023   MAMMOGRAM  03/31/2023   OPHTHALMOLOGY EXAM  04/07/2023   TETANUS/TDAP  02/20/2024   COLONOSCOPY (Pts 45-74yr Insurance coverage will need to be confirmed)  01/11/2029   Pneumonia Vaccine 70 Years old  Completed   DEXA SCAN  Completed   Hepatitis C Screening  Completed   HPV VACCINES  Aged Out    Health  Maintenance  Health Maintenance Due  Topic Date Due   Zoster Vaccines- Shingrix (1 of 2) Never done   COVID-19 Vaccine (3 - Moderna risk series) 04/27/2020   INFLUENZA VACCINE  07/19/2022    Colorectal cancer screening: Type of screening: Colonoscopy. Completed 01/11/2019. Repeat every 10 years  Mammogram status: Completed 03/30/2021. Repeat every year  Bone Density status: Completed 02/28/2022. Results reflect: Bone density results: OSTEOPENIA. Repeat every 0 years.  Lung Cancer Screening: (Low Dose CT Chest recommended if Age 341-80years, 30 pack-year currently smoking OR have quit w/in 15years.) {DOES NOT does:27190::"does not"} qualify.   Lung Cancer Screening Referral: ***  Additional Screening:  Hepatitis C Screening: does not qualify; Completed 11/24/2015  Vision Screening: Recommended annual ophthalmology exams for early detection of glaucoma and other disorders of the eye. Is the patient up to date with their annual eye exam?  {YES/NO:21197} Who is the provider or what is the name of the office in which the patient attends annual eye exams? *** If pt is not established with a provider, would they like to be referred to a  provider to establish care? {YES/NO:21197}.   Dental Screening: Recommended annual dental exams for proper oral hygiene  Community Resource Referral / Chronic Care Management: CRR required this visit?  {YES/NO:21197}  CCM required this visit?  {YES/NO:21197}     Plan:     I have personally reviewed and noted the following in the patient's chart:   Medical and social history Use of alcohol, tobacco or illicit drugs  Current medications and supplements including opioid prescriptions.  Functional ability and status Nutritional status Physical activity Advanced directives List of other physicians Hospitalizations, surgeries, and ER visits in previous 12 months Vitals Screenings to include cognitive, depression, and falls Referrals and  appointments  In addition, I have reviewed and discussed with patient certain preventive protocols, quality metrics, and best practice recommendations. A written personalized care plan for preventive services as well as general preventive health recommendations were provided to patient.     Kerin Perna, [facility]   07/20/2022   Nurse Notes: Face-To-Face minute visit  Ms. Owens Shark , Thank you for taking time to come for your Medicare Wellness Visit. I appreciate your ongoing commitment to your health goals. Please review the following plan we discussed and let me know if I can assist you in the future.   These are the goals we discussed:  Goals      Blood Pressure < 140/90     BP Readings from Last 3 Encounters:  09/14/20 (!) 199/97  07/03/20 (!) 177/87  06/10/20 (!) 152/84    Not meeting blood pressure targets      Have 3 meals a day     HEMOGLOBIN A1C < 7     Lab Results  Component Value Date   HGBA1C 7.9 (A) 07/03/2020   Not  meeting Hgb A1C target.     LDL CALC < 100     Lab Results  Component Value Date   CHOL 127 09/10/2019   HDL 54 09/10/2019   LDLCALC 54 09/10/2019   TRIG 115 09/13/2019   CHOLHDL 2.4 09/10/2019    Meeting lipid targets         This is a list of the screening recommended for you and due dates:  Health Maintenance  Topic Date Due   Zoster (Shingles) Vaccine (1 of 2) Never done   COVID-19 Vaccine (3 - Moderna risk series) 04/27/2020   Flu Shot  07/19/2022   Hemoglobin A1C  08/17/2022   Complete foot exam   03/17/2023   Mammogram  03/31/2023   Eye exam for diabetics  04/07/2023   Tetanus Vaccine  02/20/2024   Colon Cancer Screening  01/11/2029   Pneumonia Vaccine  Completed   DEXA scan (bone density measurement)  Completed   Hepatitis C Screening: USPSTF Recommendation to screen - Ages 73-79 yo.  Completed   HPV Vaccine  Aged Out

## 2022-07-21 LAB — BMP8+ANION GAP
Anion Gap: 17 mmol/L (ref 10.0–18.0)
BUN/Creatinine Ratio: 17 (ref 12–28)
BUN: 28 mg/dL — ABNORMAL HIGH (ref 8–27)
CO2: 18 mmol/L — ABNORMAL LOW (ref 20–29)
Calcium: 9.3 mg/dL (ref 8.7–10.3)
Chloride: 109 mmol/L — ABNORMAL HIGH (ref 96–106)
Creatinine, Ser: 1.61 mg/dL — ABNORMAL HIGH (ref 0.57–1.00)
Glucose: 82 mg/dL (ref 70–99)
Potassium: 5.1 mmol/L (ref 3.5–5.2)
Sodium: 144 mmol/L (ref 134–144)
eGFR: 34 mL/min/{1.73_m2} — ABNORMAL LOW (ref >59)

## 2022-07-22 ENCOUNTER — Encounter: Payer: Self-pay | Admitting: Student

## 2022-07-22 NOTE — Assessment & Plan Note (Signed)
Patient reports her allergies have been acting up recently.  She has been using over-the-counter nasal rinse and allergy medications to manage his symptoms. -Continue Flonase and PRN Zyrtec -Continue Neti pod nasal rinse as needed

## 2022-07-22 NOTE — Assessment & Plan Note (Signed)
>>  ASSESSMENT AND PLAN FOR CKD (CHRONIC KIDNEY DISEASE), STAGE IV (HCC) WRITTEN ON 07/22/2022  7:48 AM BY AMPONSAH, PROSPER M, MD  Creatinine is back down to baseline of 1.6.  GFR remains stable.  Potassium 5.1 currently close to the upper limit of normal.  We will continue to monitor closely. -Continue to follow-up with [facility] -Repeat BMP at next office visit

## 2022-07-22 NOTE — Assessment & Plan Note (Signed)
Patient here for 1 week follow-up on her blood pressure after her chlorthalidone was discontinued. Patient states she has not had any more dizzy episodes. She has occasional joint pains but overall feels fine and getting ready for her 71th birthday.  Blood pressure is slightly elevated with SBP in the 140s. She has had diarrhea on amlodipine and has not been able to tolerate a thiazide diuretic. BMP shows that patient's creatinine is back to her baseline. We will consider the addition of beta-blocker to her antihypertensive regimen if SBP remains in the 140s or higher at next office visit.  Plan: -Continue olmesartan 40 mg daily -Follow-up with PCP in 1 month

## 2022-07-22 NOTE — Assessment & Plan Note (Signed)
Creatinine is back down to baseline of 1.6.  GFR remains stable.  Potassium 5.1 currently close to the upper limit of normal.  We will continue to monitor closely. -Continue to follow-up with [facility] -Repeat BMP at next office visit

## 2022-07-22 NOTE — Assessment & Plan Note (Signed)
Patient here for diabetes follow-up after recently starting Lantus a week ago.  Patient states her blood sugars have been much better after starting Lantus.  Most of her fasting sugars have been in the 100s to the 140s.  She denies any hypoglycemic episodes. States she was unaware that her Rybelsus was increased during her last visit so she has been taking the 3 mg dose. Also states that she only has 1 week supply of the Jardiance and needs some more samples. She is due for an A1c check next month.   Plan: -Send prescription of Rybelsus 7 mg to patient's pharmacy -Continue Lantus 12 units at bedtime -Provided samples of Jardiance (2 boxes, 14-day supply) to patient -Continue metformin 500 mg BID (unable to maximize dose due to CKD) -Follow-up in 1 month for repeat A1c

## 2022-07-25 NOTE — Addendum Note (Signed)
Addended by: Lalla Brothers T on: 07/25/2022 08:08 AM   Modules accepted: Level of Service

## 2022-07-25 NOTE — Progress Notes (Signed)
Internal Medicine Clinic Attending  Case discussed with Dr. [place]  At the time of the visit.  We reviewed the resident's history and exam and pertinent patient test results.  I agree with the assessment, diagnosis, and plan of care documented in the resident's note.  

## 2022-07-26 ENCOUNTER — Encounter: Payer: Medicare Other | Admitting: Student

## 2022-08-01 ENCOUNTER — Other Ambulatory Visit: Payer: Self-pay | Admitting: Internal Medicine

## 2022-08-01 ENCOUNTER — Other Ambulatory Visit (HOSPITAL_COMMUNITY): Payer: Self-pay

## 2022-08-01 DIAGNOSIS — E1142 Type 2 diabetes mellitus with diabetic polyneuropathy: Secondary | ICD-10-CM

## 2022-08-02 ENCOUNTER — Other Ambulatory Visit (HOSPITAL_COMMUNITY): Payer: Self-pay

## 2022-08-02 ENCOUNTER — Other Ambulatory Visit: Payer: Self-pay

## 2022-08-02 DIAGNOSIS — Z794 Long term (current) use of insulin: Secondary | ICD-10-CM

## 2022-08-03 NOTE — Telephone Encounter (Signed)
Called and talked with patient. Medication remains to expensive for her. Pharmaceutical company assistance pending. Samples signed out and patient will pick up.

## 2022-08-05 ENCOUNTER — Other Ambulatory Visit (HOSPITAL_COMMUNITY): Payer: Self-pay

## 2022-08-09 ENCOUNTER — Other Ambulatory Visit (HOSPITAL_COMMUNITY): Payer: Self-pay

## 2022-08-09 MED ORDER — CHLORTHALIDONE 25 MG PO TABS
12.5000 mg | ORAL_TABLET | Freq: Every day | ORAL | 3 refills | Status: DC
  Filled 2022-08-09: qty 45, 90d supply, fill #0
  Filled 2022-11-14: qty 45, 90d supply, fill #1

## 2022-08-09 MED ORDER — FLUCONAZOLE 200 MG PO TABS
200.0000 mg | ORAL_TABLET | Freq: Once | ORAL | 0 refills | Status: AC
  Filled 2022-08-09: qty 1, 1d supply, fill #0

## 2022-08-10 ENCOUNTER — Telehealth: Payer: Self-pay | Admitting: Podiatry

## 2022-08-10 NOTE — Telephone Encounter (Signed)
Left message for patient to advise that her inserts are in and she can pick up on 8/29 when she see Dr. Elisha Ponder

## 2022-08-16 ENCOUNTER — Telehealth: Payer: Self-pay | Admitting: Internal Medicine

## 2022-08-16 ENCOUNTER — Ambulatory Visit: Payer: Medicare Other | Admitting: Podiatry

## 2022-08-16 NOTE — Telephone Encounter (Signed)
Received overnight call that patients bedside [employer] patient monitor was alarming with 5007 error. In review of [employer] software, this error "No telephone service detected.  This error is displayed only when a patient is attempting to connect their monitor using an analog telephone line". Then showed 7332 error, which in medtronic documentation is "Error occurs when patient experiences connectivity issues due to lost internet or cellular connection."  I instructed the patient work on getting her telephone/Internet repaired. She states is is currently out, and they are having a repairman coming Friday.  Reassured her that there are no issues with her device.  Lorenda Cahill, MD Cardiology

## 2022-08-17 ENCOUNTER — Ambulatory Visit (INDEPENDENT_AMBULATORY_CARE_PROVIDER_SITE_OTHER): Payer: Medicare Other

## 2022-08-17 ENCOUNTER — Other Ambulatory Visit (HOSPITAL_COMMUNITY): Payer: Self-pay

## 2022-08-17 DIAGNOSIS — M2011 Hallux valgus (acquired), right foot: Secondary | ICD-10-CM

## 2022-08-17 DIAGNOSIS — E1142 Type 2 diabetes mellitus with diabetic polyneuropathy: Secondary | ICD-10-CM

## 2022-08-17 DIAGNOSIS — M2012 Hallux valgus (acquired), left foot: Secondary | ICD-10-CM

## 2022-08-17 DIAGNOSIS — M2041 Other hammer toe(s) (acquired), right foot: Secondary | ICD-10-CM

## 2022-08-17 NOTE — Progress Notes (Signed)
Patient presents today to pick up diabetic shoes and insoles.  Patient was dispensed 1 pair of diabetic shoes and 3 pairs of foam casted diabetic insoles. Fit was satisfactory. Instructions for break-in and wear was reviewed and a copy was given to the patient.   Re-appointment for regularly scheduled diabetic foot care visits or if they should experience any trouble with the shoes or insoles.  

## 2022-08-18 NOTE — Telephone Encounter (Signed)
Received notification from [place] [employer] regarding approval for RYBELSUS 7. Patient assistance approved from 08/17/22 to 11/17/22.  MEDICATION WILL SHIP TO OFFICE IN 10-14 BUSINESS DAYS  Phone: [phone]

## 2022-08-19 ENCOUNTER — Ambulatory Visit (INDEPENDENT_AMBULATORY_CARE_PROVIDER_SITE_OTHER): Payer: Medicare Other | Admitting: Internal Medicine

## 2022-08-19 ENCOUNTER — Other Ambulatory Visit (HOSPITAL_COMMUNITY): Payer: Self-pay

## 2022-08-19 VITALS — BP 155/67 | HR 74 | Temp 98.0°F | Ht 62.0 in | Wt 136.4 lb

## 2022-08-19 DIAGNOSIS — M79662 Pain in left lower leg: Secondary | ICD-10-CM | POA: Insufficient documentation

## 2022-08-19 DIAGNOSIS — E1142 Type 2 diabetes mellitus with diabetic polyneuropathy: Secondary | ICD-10-CM

## 2022-08-19 DIAGNOSIS — Z794 Long term (current) use of insulin: Secondary | ICD-10-CM | POA: Diagnosis not present

## 2022-08-19 DIAGNOSIS — I10 Essential (primary) hypertension: Secondary | ICD-10-CM | POA: Diagnosis not present

## 2022-08-19 LAB — POCT GLYCOSYLATED HEMOGLOBIN (HGB A1C): Hemoglobin A1C: 10.9 % — AB (ref 4.0–5.6)

## 2022-08-19 LAB — GLUCOSE, CAPILLARY: Glucose-Capillary: 139 mg/dL — ABNORMAL HIGH (ref 70–99)

## 2022-08-19 MED ORDER — INSULIN LISPRO (1 UNIT DIAL) 100 UNIT/ML (KWIKPEN)
5.0000 [IU] | PEN_INJECTOR | Freq: Three times a day (TID) | SUBCUTANEOUS | 11 refills | Status: DC
  Filled 2022-08-19: qty 3, 20d supply, fill #0
  Filled 2022-09-26: qty 3, 20d supply, fill #1

## 2022-08-19 MED ORDER — CONTINUOUS GLUCOSE MONITOR SUP KIT
1.0000 | PACK | 3 refills | Status: DC
  Filled 2022-08-19: qty 1, fill #0

## 2022-08-19 MED ORDER — METFORMIN HCL ER 500 MG PO TB24
500.0000 mg | ORAL_TABLET | Freq: Every day | ORAL | 3 refills | Status: DC
  Filled 2022-08-19 – 2022-11-02 (×2): qty 90, 90d supply, fill #0

## 2022-08-19 NOTE — Assessment & Plan Note (Signed)
Blood pressure elevated to 159/68 with repeat BP of 145/76. She saw [facility] with Dr. Johnney Ou in august and was restarted on chlorthalidone. A/P: Continue olmesartan 40 mg and chlorthalidone 12.5 mg. Revisit at follow-up. Majority of visit focused on diabetes management.

## 2022-08-19 NOTE — Patient Instructions (Addendum)
Thank you, Amanda [name] for allowing us to provide your care today. Today we discussed:  Diabetes: Your HgbA1c has increased from 7.9 to 10.9 today. Our main medication to quickly bring your insulin back under control is insulin. Continue taking lantus 12 units at bedside and I am restarting you on mealtime insulin. Take 5 units as you are sitting down to eat a meal. Please follow-up in 2 weeks.  Hypertension: For now we will continue chlorthalidone and olmesartan.     Thigh pain: Please follow-up in 2 weeks about this.  I have ordered the following labs for you:   Lab Orders         Glucose, capillary         POC Hbg A1C     Referrals ordered today:   Referral Orders  No referral(s) requested today     I have ordered the following medication/changed the following medications:   Stop the following medications: Medications Discontinued During This Encounter  Medication Reason   metFORMIN (GLUCOPHAGE-XR) 500 MG 24 hr tablet      Start the following medications: Meds ordered this encounter  Medications   metFORMIN (GLUCOPHAGE-XR) 500 MG 24 hr tablet    Sig: Take 1 tablet (500 mg total) by mouth daily with breakfast.    Dispense:  90 tablet    Refill:  3    Dr. Steen is aware of poor kidney function & per secure chat would like to move forward w/ metfomrin alr 12.15.22 IM program   Continuous Glucose Monitor Sup KIT    Sig: 1 Application by Does not apply route every 14 (fourteen) days.    Dispense:  1 kit    Refill:  3   insulin lispro (HUMALOG KWIKPEN) 100 UNIT/ML KwikPen    Sig: Inject 5 Units into the skin 3 (three) times daily.    Dispense:  15 mL    Refill:  11     Follow up:  2 weeks    We look forward to seeing you next time. Please call our clinic at [phone] if you have any questions or concerns. The best time to call is Monday-Friday from 9am-4pm, but there is someone available 24/7. If after hours or the weekend, call the main hospital number and  ask for the Internal Medicine Resident On-Call. If you need medication refills, please notify your pharmacy one week in advance and they will send us a request.   Thank you for trusting me with your care. Wishing you the best!    , DO [facility]   

## 2022-08-19 NOTE — Assessment & Plan Note (Signed)
Medications include Lantus 12 units, empagliflozin 10 mg, metformin 500 mg BID, semaglutide 7 mg.  She was restarted on insulin 7/23 with worsening morning blood sugars. She has had difficulty with obtaining medications over the last few months 2/2 to cost. Shes been getting samples of semaglutide. Her renal function is consistent with CKD stage 3b and hgbAqc worsened form 7.9 to 10.9 over the last 3 months. Fasting blood sugar are between 65-177, but mostly in 100-130 range. She had previously worn at CGM and is open to the idea. A/P: Start humalog 5units TID Continue Lantus 12 units Decrease metformin to '500mg'$  qd Referral to dietician, will message donna about CGM preferred with her insurance -continue empagliflozin 10 mg qd -continue jardiance 10 mg qd, she was given 14 tabs of samples today.

## 2022-08-19 NOTE — Progress Notes (Signed)
Subjective:  CC: follow-up on diabetes, hypertension and CKD stage 3b  HPI:  [name] is a 71 y.o. female with a past medical history stated below and presents today for follow-up on diabetes, hypertension and CKD stage 3b. Please see problem based assessment and plan for additional details.  Past Medical History:  Diagnosis Date   Allergy    SEASONAL   Arthritis    Cervical radiculopathy due to degenerative joint disease of spine 05/03/2017   CKD (chronic kidney disease) stage 3, GFR 30-59 ml/min (HCC) 01/01/2021   Diabetes mellitus 1996   Glaucoma    Hyperlipidemia    Hypertension    Normocytic anemia 02/14/2013   Osteopenia 05/04/2016   Pain of both shoulder joints 11/14/2016    Current Outpatient Medications on File Prior to Visit  Medication Sig Dispense Refill   acetaminophen (TYLENOL) 325 MG tablet Take 2 tablets (650 mg total) by mouth every 6 (six) hours as needed (or Fever >/= 101). 60 tablet 0   atorvastatin (LIPITOR) 40 MG tablet Take 1 tablet (40 mg total) by mouth daily. 90 tablet 2   blood glucose meter kit and supplies KIT Dispense based on patient and insurance preference. Use up to four times daily as directed. . 1 each 0   cetirizine (ZYRTEC) 5 MG tablet Take 1 tablet (5 mg total) by mouth daily. (Patient taking differently: Take 5 mg by mouth daily as needed.) 30 tablet 1   chlorthalidone (HYGROTON) 25 MG tablet Take 0.5 tablets (12.5 mg total) by mouth daily. 45 tablet 3   diclofenac Sodium (VOLTAREN) 1 % GEL Apply 2 g topically daily as needed (pain).     empagliflozin (JARDIANCE) 10 MG TABS tablet Take 1 tablet (10 mg total) by mouth daily before breakfast. 90 tablet 2   fluticasone (FLONASE) 50 MCG/ACT nasal spray Place 1 spray into both nostrils daily. 16 g 2   glucose blood test strip You do not medically need to check blood sugar while not taking insulin. You can check your blood sugar if you are concerned when you want, but do not need to  100 each 12   insulin glargine (LANTUS) 100 UNIT/ML Solostar Pen Inject 12 Units into the skin at bedtime. 15 mL 3   Insulin Pen Needle (PEN NEEDLES) 32G X 4 MM MISC Use as directed. 100 each 10   latanoprost (XALATAN) 0.005 % ophthalmic solution Place 1 drop into both eyes at bedtime.     Multiple Vitamins-Minerals (WOMENS 50+ MULTI VITAMIN/MIN) TABS Take 1 tablet by mouth daily. 30 tablet    olmesartan (BENICAR) 40 MG tablet Take 1 tablet (40 mg total) by mouth daily. 30 tablet 2   Semaglutide (RYBELSUS) 7 MG TABS Take 7 mg by mouth daily. 90 tablet 5   [DISCONTINUED] glipiZIDE (GLUCOTROL) 5 MG tablet Take 0.5 tablets (2.5 mg total) by mouth daily before breakfast. 15 tablet 0   No current facility-administered medications on file prior to visit.    Family History  Problem Relation Age of Onset   Diabetes Mother    Hypertension Mother    Breast cancer Mother    Cancer Mother    Diabetes Sister    Hypertension Sister    Kidney disease Sister        One Kidney transplant, one sister on HD   Breast cancer Sister    Cancer Sister    Hypertension Sister    Diabetes Sister    Cerebral palsy Brother  Cancer Brother        Unknown   Hypertension Daughter    Colon cancer Neg Hx    Esophageal cancer Neg Hx    Stomach cancer Neg Hx    Rectal cancer Neg Hx     Social History   Socioeconomic History   Marital status: Married    Spouse name: Not on file   Number of children: 1   Years of education: Not on file   Highest education level: Not on file  Occupational History   Occupation: Retired    Comment: Printmaker  Tobacco Use   Smoking status: Never   Smokeless tobacco: Never  Vaping Use   Vaping Use: Never used  Substance and Sexual Activity   Alcohol use: No    Alcohol/week: 0.0 standard drinks of alcohol   Drug use: No   Sexual activity: Not Currently    Birth control/protection: Surgical  Other Topics Concern   Not on file  Social History Narrative   Current  Social History 04/23/2021        Patient lives with husband in a one level home with ramp      Patient's method of transportation is personal car shared with her mother.      The highest level of education was high school diploma.      The patient currently retired from [employer].   Social Determinants of Health   Financial Resource Strain: Medium Risk (07/01/2022)   Overall Financial Resource Strain (CARDIA)    Difficulty of Paying Living Expenses: Somewhat hard  Food Insecurity: No Food Insecurity (07/01/2022)   Hunger Vital Sign    Worried About Running Out of Food in the Last Year: Never true    Ran Out of Food in the Last Year: Never true  Transportation Needs: No Transportation Needs (07/01/2022)   PRAPARE - Hydrologist (Medical): No    Lack of Transportation (Non-Medical): No  Physical Activity: Inactive (07/01/2022)   Exercise Vital Sign    Days of Exercise per Week: 0 days    Minutes of Exercise per Session: 0 min  Stress: No Stress Concern Present (07/01/2022)   [place]    Feeling of Stress : Not at all  Social Connections: [place] (07/01/2022)   Social Connection and Isolation Panel [NHANES]    Frequency of Communication with Friends and Family: More than three times a week    Frequency of Social Gatherings with Friends and Family: More than three times a week    Attends Religious Services: More than 4 times per year    Active Member of [employer] or Organizations: Yes    Attends Music therapist: More than 4 times per year    Marital Status: Married  Human resources officer Violence: Not At Risk (07/01/2022)   Humiliation, Afraid, Rape, and Kick questionnaire    Fear of Current or Ex-Partner: No    Emotionally Abused: No    Physically Abused: No    Sexually Abused: No    Review of Systems: ROS negative except for what is noted on the assessment and  plan.  Objective:   Vitals:   08/19/22 1017 08/19/22 1118 08/19/22 1119  BP: (!) 159/68 (!) 145/76 (!) 155/67  Pulse: 76 76 74  Temp: 98 F (36.7 C)    TempSrc: Oral    SpO2: 100%    Weight: 136 lb 6.4 oz (61.9 kg)  Height: '5\' 2"'  (1.575 m)      Physical Exam: Constitutional: well-appearing, in no acute distress Cardiovascular: regular rate and rhythm, no m/r/g Pulmonary/Chest: normal work of breathing on room air, lungs clear to auscultation bilaterally Abdominal: soft, non-tender, non-distended MSK: normal bulk and tone,no swelling to lower extremity bilaterally, dorsalis pedis pulses 2+ in left foot Neurological: alert & oriented x 3, normal gait Skin: warm and dry   Assessment & Plan:  DM II (diabetes mellitus, type II), controlled (HCC) Medications include Lantus 12 units, empagliflozin 10 mg, metformin 500 mg BID, semaglutide 7 mg.  She was restarted on insulin 7/23 with worsening morning blood sugars. She has had difficulty with obtaining medications over the last few months 2/2 to cost. Shes been getting samples of semaglutide. Her renal function is consistent with CKD stage 3b and hgbAqc worsened form 7.9 to 10.9 over the last 3 months. Fasting blood sugar are between 65-177, but mostly in 100-130 range. She had previously worn at CGM and is open to the idea. A/P: Start humalog 5units TID Continue Lantus 12 units Decrease metformin to 552m qd Referral to dietician, will message donna about CGM preferred with her insurance -continue empagliflozin 10 mg qd -continue jardiance 10 mg qd, she was given 14 tabs of samples today.  Essential hypertension Blood pressure elevated to 159/68 with repeat BP of 145/76. [facility]. KJohnney Ouin august and was restarted on chlorthalidone. A/P: Continue olmesartan 40 mg and chlorthalidone 12.5 mg. Revisit at follow-up. Majority of visit focused on diabetes management.  Pain of left calf Pain present in calf for  several weeks. No trauma to leg recently. Present at rest and with ambulation. She is concerned it is swollen. On exam no notable swelling present bilaterally to lower extremities. No erythema. Mild tenderness present to calf. Dorsalis pedis pulse palpable. A/P: Low suspicion for DVT or fracture. If pain continues, follow-up at visit in 2 weeks.   Patient discussed with Dr. VJay SchlichterMasters, D.O. [facility]  PGY-2 Pager: [phone] Phone: [phone] 08/19/2022  Time 6:35 PM

## 2022-08-19 NOTE — Assessment & Plan Note (Signed)
Pain present in calf for several weeks. No trauma to leg recently. Present at rest and with ambulation. She is concerned it is swollen. On exam no notable swelling present bilaterally to lower extremities. No erythema. Mild tenderness present to calf. Dorsalis pedis pulse palpable. A/P: Low suspicion for DVT or fracture. If pain continues, follow-up at visit in 2 weeks.

## 2022-08-23 ENCOUNTER — Other Ambulatory Visit (HOSPITAL_COMMUNITY): Payer: Self-pay

## 2022-08-23 NOTE — Progress Notes (Signed)
Internal Medicine Clinic Attending  Case discussed with Dr. [name]  At the time of the visit.  We reviewed the resident's history and exam and pertinent patient test results.  I agree with the assessment, diagnosis, and plan of care documented in the resident's note.  

## 2022-08-24 ENCOUNTER — Other Ambulatory Visit: Payer: Self-pay | Admitting: Internal Medicine

## 2022-08-24 ENCOUNTER — Other Ambulatory Visit (HOSPITAL_COMMUNITY): Payer: Self-pay

## 2022-08-24 DIAGNOSIS — Z794 Long term (current) use of insulin: Secondary | ICD-10-CM

## 2022-08-24 MED ORDER — DEXCOM G6 SENSOR MISC
6 refills | Status: DC
  Filled 2022-08-24: qty 3, 30d supply, fill #0
  Filled 2022-09-26: qty 3, 30d supply, fill #1

## 2022-08-24 MED ORDER — DEXCOM G6 TRANSMITTER MISC
1.0000 | 0 refills | Status: DC
  Filled 2022-08-24: qty 1, 90d supply, fill #0

## 2022-08-25 ENCOUNTER — Other Ambulatory Visit (HOSPITAL_COMMUNITY): Payer: Self-pay

## 2022-08-30 ENCOUNTER — Telehealth: Payer: Self-pay | Admitting: Dietician

## 2022-08-30 ENCOUNTER — Other Ambulatory Visit (HOSPITAL_COMMUNITY): Payer: Self-pay

## 2022-08-30 DIAGNOSIS — Z794 Long term (current) use of insulin: Secondary | ICD-10-CM

## 2022-08-30 MED ORDER — DEXCOM G6 RECEIVER DEVI
0 refills | Status: DC
  Filled 2022-08-30: qty 1, 30d supply, fill #0

## 2022-08-30 NOTE — Telephone Encounter (Signed)
Called Amanda [name] about referral for personal CGM from Dr. Howie Ill. She has already picked up Dexcom G6 sensors and transmitter and would also like a receiver. She set up and appointment with me for Tuesday, 9/19 to learn how to use it.

## 2022-09-02 ENCOUNTER — Ambulatory Visit (INDEPENDENT_AMBULATORY_CARE_PROVIDER_SITE_OTHER): Payer: Medicare Other | Admitting: Podiatry

## 2022-09-02 ENCOUNTER — Encounter: Payer: Self-pay | Admitting: Podiatry

## 2022-09-02 ENCOUNTER — Other Ambulatory Visit: Payer: Self-pay

## 2022-09-02 ENCOUNTER — Other Ambulatory Visit (HOSPITAL_COMMUNITY): Payer: Self-pay

## 2022-09-02 ENCOUNTER — Ambulatory Visit (INDEPENDENT_AMBULATORY_CARE_PROVIDER_SITE_OTHER): Payer: Medicare Other | Admitting: Student

## 2022-09-02 ENCOUNTER — Encounter: Payer: Self-pay | Admitting: Student

## 2022-09-02 VITALS — BP 138/73 | HR 73 | Temp 97.9°F | Ht 61.0 in | Wt 141.9 lb

## 2022-09-02 DIAGNOSIS — M79674 Pain in right toe(s): Secondary | ICD-10-CM

## 2022-09-02 DIAGNOSIS — Z599 Problem related to housing and economic circumstances, unspecified: Secondary | ICD-10-CM | POA: Diagnosis not present

## 2022-09-02 DIAGNOSIS — Z794 Long term (current) use of insulin: Secondary | ICD-10-CM

## 2022-09-02 DIAGNOSIS — M79675 Pain in left toe(s): Secondary | ICD-10-CM

## 2022-09-02 DIAGNOSIS — I10 Essential (primary) hypertension: Secondary | ICD-10-CM

## 2022-09-02 DIAGNOSIS — B351 Tinea unguium: Secondary | ICD-10-CM | POA: Diagnosis not present

## 2022-09-02 DIAGNOSIS — Z7984 Long term (current) use of oral hypoglycemic drugs: Secondary | ICD-10-CM

## 2022-09-02 DIAGNOSIS — E1142 Type 2 diabetes mellitus with diabetic polyneuropathy: Secondary | ICD-10-CM | POA: Diagnosis not present

## 2022-09-02 DIAGNOSIS — M79662 Pain in left lower leg: Secondary | ICD-10-CM

## 2022-09-02 MED ORDER — EMPAGLIFLOZIN 10 MG PO TABS
10.0000 mg | ORAL_TABLET | Freq: Every day | ORAL | 2 refills | Status: DC
  Filled 2022-09-02 – 2022-10-24 (×2): qty 30, 30d supply, fill #0

## 2022-09-02 MED ORDER — RYBELSUS 7 MG PO TABS
7.0000 mg | ORAL_TABLET | Freq: Every day | ORAL | 5 refills | Status: DC
  Filled 2022-09-02: qty 30, 30d supply, fill #0

## 2022-09-02 MED ORDER — DICLOFENAC SODIUM 1 % EX GEL
2.0000 g | Freq: Every day | CUTANEOUS | 2 refills | Status: DC | PRN
  Filled 2022-09-02: qty 100, 30d supply, fill #0

## 2022-09-02 NOTE — Assessment & Plan Note (Signed)
Clinic blood pressure today is 138/73.  She is currently taking olmesartan 40 mg and chlorthalidone 12.5 mg.  She reports good adherence to this medication.  Room to improve with her blood pressure.  However given her diabetes is the main problem right now we did not talk about medication changes for hypertension today.  Follow-up with next visit.

## 2022-09-02 NOTE — Assessment & Plan Note (Signed)
Jardiance and rybelsus are not affordable with her current insurance plan. Patient is not eligible for IM program $4 med list and the medicines she needs are not on $5, $10, or $15 list.  Referral to care management placed for help affording needed medications.

## 2022-09-02 NOTE — Progress Notes (Signed)
Subjective:  Reason for visit: Follow up for diabetes and insulin management  HPI:  [name] is a 71 y.o. female with history of CKD stage III, type 2 diabetes, hypertension, PAD who presents for follow-up of diabetes and insulin management. Please see problem based assessment and plan for additional details.  Past Medical History:  Diagnosis Date   Allergy    SEASONAL   Arthritis    Cervical radiculopathy due to degenerative joint disease of spine 05/03/2017   CKD (chronic kidney disease) stage 3, GFR 30-59 ml/min ([place]) 01/01/2021   Diabetes mellitus 1996   Glaucoma    H. pylori infection    Hyperlipidemia    Hypertension    Normocytic anemia 02/14/2013   Osteopenia 05/04/2016   Pain of both shoulder joints 11/14/2016    Current Outpatient Medications on File Prior to Visit  Medication Sig Dispense Refill   acetaminophen (TYLENOL) 325 MG tablet Take 2 tablets (650 mg total) by mouth every 6 (six) hours as needed (or Fever >/= 101). 60 tablet 0   atorvastatin (LIPITOR) 40 MG tablet Take 1 tablet (40 mg total) by mouth daily. 90 tablet 2   blood glucose meter kit and supplies KIT Dispense based on patient and insurance preference. Use up to four times daily as directed. . 1 each 0   chlorthalidone (HYGROTON) 25 MG tablet Take 0.5 tablets (12.5 mg total) by mouth daily. 45 tablet 3   Continuous Blood Gluc Receiver (DEXCOM G6 RECEIVER) DEVI Use to check blood sugar continuously 1 each 0   Continuous Blood Gluc Sensor (DEXCOM G6 SENSOR) MISC Change sensor every 10 days 3 each 6   Continuous Blood Gluc Transmit (DEXCOM G6 TRANSMITTER) MISC Change every 90 days 1 each 0   fluticasone (FLONASE) 50 MCG/ACT nasal spray Place 1 spray into both nostrils daily. 16 g 2   glucose blood test strip You do not medically need to check blood sugar while not taking insulin. You can check your blood sugar if you are concerned when you want, but do not need to 100 each 12   insulin  glargine (LANTUS) 100 UNIT/ML Solostar Pen Inject 12 Units into the skin at bedtime. 15 mL 3   insulin lispro (HUMALOG KWIKPEN) 100 UNIT/ML KwikPen Inject 5 Units into the skin 3 (three) times daily. 15 mL 11   Insulin Pen Needle (PEN NEEDLES) 32G X 4 MM MISC Use as directed. 100 each 10   latanoprost (XALATAN) 0.005 % ophthalmic solution Place 1 drop into both eyes at bedtime.     metFORMIN (GLUCOPHAGE-XR) 500 MG 24 hr tablet Take 1 tablet (500 mg total) by mouth daily with breakfast. 90 tablet 3   Multiple Vitamins-Minerals (WOMENS 50+ MULTI VITAMIN/MIN) TABS Take 1 tablet by mouth daily. 30 tablet    olmesartan (BENICAR) 40 MG tablet Take 1 tablet (40 mg total) by mouth daily. 30 tablet 2   [DISCONTINUED] glipiZIDE (GLUCOTROL) 5 MG tablet Take 0.5 tablets (2.5 mg total) by mouth daily before breakfast. 15 tablet 0   No current facility-administered medications on file prior to visit.    Family History  Problem Relation Age of Onset   Diabetes Mother    Hypertension Mother    Breast cancer Mother    Cancer Mother    Diabetes Sister    Hypertension Sister    Kidney disease Sister        One Kidney transplant, one sister on HD   Breast cancer Sister  Cancer Sister    Hypertension Sister    Diabetes Sister    Cerebral palsy Brother    Cancer Brother        Unknown   Hypertension Daughter    Colon cancer Neg Hx    Esophageal cancer Neg Hx    Stomach cancer Neg Hx    Rectal cancer Neg Hx     Social History   Socioeconomic History   Marital status: Married    Spouse name: Not on file   Number of children: 1   Years of education: Not on file   Highest education level: Not on file  Occupational History   Occupation: Retired    Comment: Printmaker  Tobacco Use   Smoking status: Never   Smokeless tobacco: Never  Vaping Use   Vaping Use: Never used  Substance and Sexual Activity   Alcohol use: No    Alcohol/week: 0.0 standard drinks of alcohol   Drug use: No    Sexual activity: Not Currently    Birth control/protection: Surgical  Other Topics Concern   Not on file  Social History Narrative   Current Social History 04/23/2021        Patient lives with husband in a one level home with ramp      Patient's method of transportation is personal car shared with her mother.      The highest level of education was high school diploma.      The patient currently retired from [employer].   Social Determinants of Health   Financial Resource Strain: Medium Risk (07/01/2022)   Overall Financial Resource Strain (CARDIA)    Difficulty of Paying Living Expenses: Somewhat hard  Food Insecurity: No Food Insecurity (07/01/2022)   Hunger Vital Sign    Worried About Running Out of Food in the Last Year: Never true    Ran Out of Food in the Last Year: Never true  Transportation Needs: No Transportation Needs (07/01/2022)   PRAPARE - Hydrologist (Medical): No    Lack of Transportation (Non-Medical): No  Physical Activity: Inactive (07/01/2022)   Exercise Vital Sign    Days of Exercise per Week: 0 days    Minutes of Exercise per Session: 0 min  Stress: No Stress Concern Present (07/01/2022)   [place]    Feeling of Stress : Not at all  Social Connections: [place] (07/01/2022)   Social Connection and Isolation Panel [NHANES]    Frequency of Communication with Friends and Family: More than three times a week    Frequency of Social Gatherings with Friends and Family: More than three times a week    Attends Religious Services: More than 4 times per year    Active Member of [employer] or Organizations: Yes    Attends Music therapist: More than 4 times per year    Marital Status: Married  Human resources officer Violence: Not At Risk (07/01/2022)   Humiliation, Afraid, Rape, and Kick questionnaire    Fear of Current or Ex-Partner: No    Emotionally Abused:  No    Physically Abused: No    Sexually Abused: No    Review of Systems: ROS negative except for what is noted on the assessment and plan.  Objective:   Vitals:   09/02/22 1003  BP: 138/73  Pulse: 73  Temp: 97.9 F (36.6 C)  TempSrc: Oral  SpO2: 99%  Weight: 141 lb 14.4 oz (  64.4 kg)  Height: _0  (1.549 m)    Physical Exam Constitutional:      General: She is not in acute distress.    Appearance: Normal appearance.  HENT:     Mouth/Throat:     Mouth: Mucous membranes are moist.  Cardiovascular:     Rate and Rhythm: Normal rate and regular rhythm.     Pulses: Normal pulses.  Pulmonary:     Effort: Pulmonary effort is normal.     Breath sounds: Normal breath sounds. No stridor.  Abdominal:     Palpations: Abdomen is soft.     Tenderness: There is no abdominal tenderness.  Musculoskeletal:     Right lower leg: No edema.     Left lower leg: No edema.  Lymphadenopathy:     Cervical: No cervical adenopathy.  Skin:    General: Skin is warm and dry.  Neurological:     Mental Status: She is alert. Mental status is at baseline.  Psychiatric:        Mood and Affect: Mood normal.        Behavior: Behavior normal.       Assessment & Plan:  DM II (diabetes mellitus, type II), controlled ([place]) Patient is currently on 12 units of long-acting insulin nightly, Jardiance, and Rybelsus.  At her last visit she was prescribed short acting insulin 5 units 3 times daily.  She reports that she has not been using her short acting insulin as prescribed, often with holding doses or using 2 to 3 units rather than 5, for fear of hypoglycemia.  We reviewed her recent blood glucose records together, which she brought in on a notepad.  It was notable for an early morning low in the 60s without symptoms and hyperglycemia towards midday and end of day.  Generally she says that her blood sugar is higher after meals.  Overall she feels well.  Based on her handwritten blood glucose log for the  last 2 weeks I get the sense that she is still experiencing a lot of postprandial highs.  Her early morning lows are not too worrisome as she was asymptomatic and they only occurred once.  Her refined the short acting insulin regimen and instructed her to take 5 units with her main meals of the day.  She has an appointment with Ms. Plyler, our dietitian next week for fitting of a CGM.  I have also reached out to our pharmacy technician to see if they have any ideas regarding discounts for Jardiance and Rybelsus.  Instructed to come back in 2 weeks for follow-up visit for diabetes and hypertension. - Continue Lantus 12 units at bedtime - Continue Humalog 5 units with main meals of the day - Refilled Jardiance and Rybelsus which she can pick up pending affordability  Essential hypertension Clinic blood pressure today is 138/73.  She is currently taking olmesartan 40 mg and chlorthalidone 12.5 mg.  She reports good adherence to this medication.  Room to improve with her blood pressure.  However given her diabetes is the main problem right now we did not talk about medication changes for hypertension today.  Follow-up with next visit.  Financial difficulties Jardiance and rybelsus are not affordable with her current insurance plan. Patient is not eligible for IM program $4 med list and the medicines she needs are not on $5, $10, or $15 list.  Referral to care management placed for help affording needed medications.  Follow up in 2 weeks.  Patient seen with  Dr. Loree Fee, M.D. [facility]  PGY-1 Pager: [phone] Date 09/02/2022  Time 3:51 PM

## 2022-09-02 NOTE — Patient Instructions (Addendum)
Today we discussed diabetes and high blood pressure.  For your diabetes, continue your long acting insulin 12 units at bedtime. Take 5 units of short acting insulin with your main meals of the day.  I will ask our pharmacy colleagues about [place] and Rybelsus.   You'll see Ms. Plyler on Tuesday for more information about diabetes.  Return to the clinic in 2 weeks for a follow-up visit.    I will call you with the results of the following laboratory tests:  Lab Orders         BMP8+Anion Gap       Expect a call from the offices of the following departments:  Referral Orders         AMB Referral to [facility]      Please call our clinic at [phone] Monday through Friday from 9 am to 4 pm if you have questions or concerns about your health. If after hours or on the weekend, call the main hospital number and ask for the Internal Medicine Resident On-Call. If you need medication refills, please notify your pharmacy one week in advance and they will send [place] a request.   Best, Nani Gasser, [place]

## 2022-09-02 NOTE — Assessment & Plan Note (Signed)
Patient is currently on 12 units of long-acting insulin nightly, Jardiance, and Rybelsus.  At her last visit she was prescribed short acting insulin 5 units 3 times daily.  She reports that she has not been using her short acting insulin as prescribed, often with holding doses or using 2 to 3 units rather than 5, for fear of hypoglycemia.  We reviewed her recent blood glucose records together, which she brought in on a notepad.  It was notable for an early morning low in the 60s without symptoms and hyperglycemia towards midday and end of day.  Generally she says that her blood sugar is higher after meals.  Overall she feels well.  Based on her handwritten blood glucose log for the last 2 weeks I get the sense that she is still experiencing a lot of postprandial highs.  Her early morning lows are not too worrisome as she was asymptomatic and they only occurred once.  Her refined the short acting insulin regimen and instructed her to take 5 units with her main meals of the day.  She has an appointment with Ms. Plyler, our dietitian next week for fitting of a CGM.  I have also reached out to our pharmacy technician to see if they have any ideas regarding discounts for Jardiance and Rybelsus.  Instructed to come back in 2 weeks for follow-up visit for diabetes and hypertension. - Continue Lantus 12 units at bedtime - Continue Humalog 5 units with main meals of the day - Refilled Jardiance and Rybelsus which she can pick up pending affordability

## 2022-09-03 LAB — BMP8+ANION GAP
Anion Gap: 12 mmol/L (ref 10.0–18.0)
BUN/Creatinine Ratio: 25 (ref 12–28)
BUN: 42 mg/dL — ABNORMAL HIGH (ref 8–27)
CO2: 23 mmol/L (ref 20–29)
Calcium: 9.2 mg/dL (ref 8.7–10.3)
Chloride: 108 mmol/L — ABNORMAL HIGH (ref 96–106)
Creatinine, Ser: 1.69 mg/dL — ABNORMAL HIGH (ref 0.57–1.00)
Glucose: 105 mg/dL — ABNORMAL HIGH (ref 70–99)
Potassium: 5.2 mmol/L (ref 3.5–5.2)
Sodium: 143 mmol/L (ref 134–144)
eGFR: 32 mL/min/{1.73_m2} — ABNORMAL LOW (ref >59)

## 2022-09-05 NOTE — Progress Notes (Signed)
Patient was called and informed of stable creatinine. No changes to her medical management at this time.

## 2022-09-05 NOTE — Progress Notes (Signed)
Internal Medicine Clinic Attending  I saw and evaluated the patient.  I personally confirmed the key portions of the history and exam documented by Dr. McLendon and I reviewed pertinent patient test results.  The assessment, diagnosis, and plan were formulated together and I agree with the documentation in the resident's note.  

## 2022-09-06 ENCOUNTER — Ambulatory Visit (INDEPENDENT_AMBULATORY_CARE_PROVIDER_SITE_OTHER): Payer: Medicare Other | Admitting: Dietician

## 2022-09-06 ENCOUNTER — Telehealth: Payer: Self-pay | Admitting: Dietician

## 2022-09-06 ENCOUNTER — Telehealth: Payer: Self-pay

## 2022-09-06 DIAGNOSIS — Z794 Long term (current) use of insulin: Secondary | ICD-10-CM | POA: Diagnosis not present

## 2022-09-06 DIAGNOSIS — E1142 Type 2 diabetes mellitus with diabetic polyneuropathy: Secondary | ICD-10-CM | POA: Diagnosis not present

## 2022-09-06 NOTE — Patient Instructions (Signed)
Wear your new Dexcom G6 Continuous glucose monitor for the next 10 days and have fun!!!   Note the colors of the circle and what they mean   Note if you have both a number and an arrow.   Watch the graph populate with dots (every 5 minutes a new dot will appear with your new reading) .  Call [facility] to see what that support is about.   Call Dexcom tech for any questions or if a sensor does not last a full 10 days.   Butch Penny [phone]

## 2022-09-06 NOTE — Telephone Encounter (Signed)
Pt picked up 2 boxes of Jardiance and 1 box of Rybelsus.

## 2022-09-06 NOTE — Telephone Encounter (Signed)
Call to patient to be sure her sensor is working and to tel her samples have been signed out to her and she can pick them up. She acknowledged the information about her samples and says her reader has both numbers and arrows and she keeps looking to see what it is.

## 2022-09-06 NOTE — Telephone Encounter (Signed)
Received notification from [place] regarding approval for RYBELSUS. Patient assistance approved from 08/17/22 to 11/17/22.  MEDICATION WILL SHIP TO OFFICE  Phone: [phone]

## 2022-09-07 NOTE — Telephone Encounter (Signed)
*  SAMPLE INFO*  Medication Samples have been provided to the patient.  Drug name: RYBELSUS       Strength: '7MG'$         Qty: 30 TABS  LOT: [id number]  Exp.Date: 05/19/23  Drug name: JARDIANCE       Strength: '10MG'$         Qty: 14 TABS  LOT: [id number]  Exp.Date: 05/2023  [place]

## 2022-09-07 NOTE — Progress Notes (Signed)
Diabetes Self-Management Education  Visit Type:  Annual Follow-Up  Appt. Start Time: 930 Appt. End Time: 1030  09/07/2022  [name], identified by name and date of birth, is a 71 y.o. female with a diagnosis of Diabetes:  [name] Type 2  ASSESSMENT  Lab Results  Component Value Date   HGBA1C 10.9 (A) 08/19/2022   HGBA1C 7.9 (A) 05/17/2022   HGBA1C 8.4 (A) 02/03/2022   HGBA1C 7.4 (A) 11/17/2021   HGBA1C 6.9 (A) 09/02/2021    Wt Readings from Last 10 Encounters:  09/02/22 141 lb 14.4 oz (64.4 kg)  08/19/22 136 lb 6.4 oz (61.9 kg)  07/20/22 132 lb (59.9 kg)  07/12/22 123 lb 4.8 oz (55.9 kg)  06/23/22 131 lb 3.2 oz (59.5 kg)  05/27/22 135 lb 8 oz (61.5 kg)  05/17/22 134 lb (60.8 kg)  04/12/22 125 lb 6.4 oz (56.9 kg)  03/16/22 125 lb 12.8 oz (57.1 kg)  02/16/22 128 lb (58.1 kg)   BP Readings from Last 3 Encounters:  09/02/22 138/73  08/19/22 (!) 155/67  07/20/22 (!) 141/72   Dexcom G6 Personal CGM Training  Amanda [name] brought her Dexcom G6 receiver, sensor an transmitters and was educated about the following:  -Getting to know device    Software engineer ) -Setting up device (high alert  400  , low alert 75  ) -Rise alert set at   off mg/dL  -Fall alert set at    off  mg/dL  -Setting alert profile -Inserting sensor (upper abdomen per patient WNL) -Calibrating- none required for G6 unless sensor code not entered -Ending sensor session -Trouble shooting -Patch guide -Reviewed insulin dosing from Dexcom using trend arrows. Sh verbalized that she is to discuss this with her physician before doing  Patient has [place] and my contact information.  [name], RD 09/07/2022 11:19 AM.     Diabetes Self-Management Education - 09/07/22 1100       Psychosocial Assessment   Patient Belief/Attitude about Diabetes Motivated to manage diabetes    Self-care barriers Lack of material resources    Self-management support Family;Doctor's office;CDE  visits    Patient Concerns Monitoring    Special Needs None    Preferred Learning Style Hands on    Learning Readiness Ready      Pre-Education Assessment   Patient understands the diabetes disease and treatment process. N/A (Comment)    Patient understands incorporating nutritional management into lifestyle. NA (comment)    Patient undertands incorporating physical activity into lifestyle. N/A (comment)    Patient understands using medications safely. N/A (comment)    Patient understands monitoring blood glucose, interpreting and using results Needs Instruction    Patient understands prevention, detection, and treatment of acute complications. Needs Review    Patient understands prevention, detection, and treatment of chronic complications. N/A (comment)    Patient understands how to develop strategies to address psychosocial issues. Comprehends key points    Patient understands how to develop strategies to promote health/change behavior. Comprehends key points      Complications   Last HgB A1C per patient/outside source 10.9 %    How often do you check your blood sugar? 1-2 times/day    Fasting Blood glucose range (mg/dL) --   need to assess at follow up   Postprandial Blood glucose range (mg/dL) --   need to assess at follow up   Number of hypoglycemic episodes per month --   need to assess at follow up  Number of hyperglycemic episodes ( >'200mg'$ /dL): --   need to assess at follow up   Have you had a dilated eye exam in the past 12 months? Yes    Have you had a dental exam in the past 12 months? No    Are you checking your feet? Yes    How many days per week are you checking your feet? 7      Dietary Intake   Breakfast need to assess at follow up      Activity / Exercise   Activity / Exercise Type --   need to assess at follow up     Patient Education   Previous Diabetes Education Yes (please comment)   here specifically for CGM education   Monitoring Taught/evaluated CGM  (comment)   see notes under assessment     Individualized Goals (developed by patient)   Monitoring  Consistenly use CGM      Post-Education Assessment   Patient understands monitoring blood glucose, interpreting and using results Comprehends key points      Outcomes   Program Status Not Completed      Subsequent Visit   Since your last visit have you continued or begun to take your medications as prescribed? Yes    Since your last visit have you had your blood pressure checked? Yes    Is your most recent blood pressure lower, unchanged, or higher since your last visit? Lower    Since your last visit have you experienced any weight changes? Gain    Weight Gain (lbs) 14    Since your last visit, are you checking your blood glucose at least once a day? Yes             Learning Objective:  Patient will have a greater understanding of diabetes self-management. Patient education plan is to attend individual and/or group sessions per assessed needs and concerns.   Plan:   Patient Instructions  Wear your new Dexcom G6 Continuous glucose monitor for the next 10 days and have fun!!!   Note the colors of the circle and what they mean   Note if you have both a number and an arrow.   Watch the graph populate with dots (every 5 minutes a new dot will appear with your new reading) .  Call [facility] to see what that support is about.   Call Dexcom tech for any questions or if a sensor does not last a full 10 days.   Amanda [name] [phone]    Expected Outcomes:  Demonstrated interest in learning. Expect positive outcomes  Education material provided: Diabetes Resources  If problems or questions, patient to contact team via:  Phone  Future DSME appointment: - 2 wks [name], RD 09/07/2022 11:22 AM.

## 2022-09-08 NOTE — Progress Notes (Signed)
  Subjective:  Patient ID: Amanda [name], female    DOB: Jul 26, 1951,  MRN: [id number]  Amanda [name] presents to clinic today for at risk foot care with history of diabetic neuropathy and painful elongated mycotic toenails 1-5 bilaterally which are tender when wearing enclosed shoe gear. Pain is relieved with periodic professional debridement.  New problem(s): None.   PCP is [name], Joellen Jersey, DO , and last visit was  August 19, 2022.  Allergies  Allergen Reactions   Gabapentin Other (See Comments)    Pt states medication made her feel "out of it".   Pt states medication made her feel "out of it".      Review of Systems: Negative except as noted in the HPI.  Objective: No changes noted in today's physical examination. Amanda [name] is a pleasant 71 y.o. female in NAD. AAO x 3.  Neurovascular Examination: Capillary refill time to digits immediate b/l. Palpable pedal pulses b/l LE. Pedal hair present. No pain with calf compression b/l. Lower extremity skin temperature gradient within normal limits. No edema noted b/l LE. No cyanosis or clubbing noted b/l LE.  Pt has subjective symptoms of neuropathy. Protective sensation intact 5/5 intact bilaterally with 10g monofilament b/l. Vibratory sensation intact b/l.  Dermatological:  Pedal integument with normal turgor, texture and tone BLE. No open wounds b/l LE. No interdigital macerations noted b/l LE. Toenail(s) 1-5 bilaterally elongated, discolored, dystrophic, thickened. Nails are crumbly with subungual debris and there is exquisite tenderness to dorsal palpation. No subungual wound(s) noted. No hyperkeratotic nor porokeratotic lesions present on today's visit.  Musculoskeletal:  Muscle strength 5/5 to all lower extremity muscle groups bilaterally. HAV with bunion deformity noted b/l LE. Hammertoe(s) noted to the R 5th toe.  Assessment/Plan: 1. Pain due to onychomycosis of toenails of both feet   2. Diabetic peripheral neuropathy  associated with type 2 diabetes mellitus ([place])   -Patient was evaluated and treated. All patient's and/or POA's questions/concerns answered on today's visit. -Continue foot and shoe inspections daily. Monitor blood glucose per PCP/Endocrinologist's recommendations. -Toenails 1-5 b/l were debrided in length and girth with sterile nail nippers and dremel without iatrogenic bleeding.  -Patient/POA to call should there be question/concern in the interim.   Return in about 3 months (around 12/02/2022).  Marzetta Board, DPM

## 2022-09-08 NOTE — Telephone Encounter (Signed)
Informed pt her medication is ready for pickup.  4 boxes of Rybelsus are labeled and ready in med room.

## 2022-09-08 NOTE — Telephone Encounter (Signed)
Pt picked up 4 boxes of Rybelsus.

## 2022-09-13 ENCOUNTER — Other Ambulatory Visit (HOSPITAL_COMMUNITY): Payer: Self-pay

## 2022-09-15 ENCOUNTER — Encounter: Payer: Self-pay | Admitting: Internal Medicine

## 2022-09-15 ENCOUNTER — Ambulatory Visit (INDEPENDENT_AMBULATORY_CARE_PROVIDER_SITE_OTHER): Payer: Medicare Other | Admitting: Internal Medicine

## 2022-09-15 ENCOUNTER — Ambulatory Visit (INDEPENDENT_AMBULATORY_CARE_PROVIDER_SITE_OTHER): Payer: Medicare Other | Admitting: Dietician

## 2022-09-15 ENCOUNTER — Other Ambulatory Visit: Payer: Self-pay

## 2022-09-15 DIAGNOSIS — Z794 Long term (current) use of insulin: Secondary | ICD-10-CM

## 2022-09-15 DIAGNOSIS — E1142 Type 2 diabetes mellitus with diabetic polyneuropathy: Secondary | ICD-10-CM | POA: Diagnosis not present

## 2022-09-15 DIAGNOSIS — Z23 Encounter for immunization: Secondary | ICD-10-CM

## 2022-09-15 NOTE — Progress Notes (Addendum)
Diabetes Self-Management Education  Visit Type:  Annual Follow-Up (#2)  Appt. Start Time: 900 Appt. End Time: 930  09/15/2022  Amanda [name], identified by name and date of birth, is a 71 y.o. female with a diagnosis of Diabetes:  T2 .   ASSESSMENT  Care discussed with Dr. Elliot Gurney. Goal is to lower A1C to preserve kidney function per Dr. Howie Ill. Blood sugars improving with CGM. Suggested increased lag time between mealtime injection and meal..   Lab Results  Component Value Date   HGBA1C 10.9 (A) 08/19/2022   HGBA1C 7.9 (A) 05/17/2022   HGBA1C 8.4 (A) 02/03/2022   HGBA1C 7.4 (A) 11/17/2021   HGBA1C 6.9 (A) 09/02/2021     CGM Results from download: 9/15 to 09/15/22  % Time CGM active:   100 %   (Goal >70%)  Average glucose:   192 mg/dL for 9 days  Glucose management indicator:   7.9 %  Time in range (70-180 mg/dL):   49 %   (Goal >70%)  Time High (181-250 mg/dL) and Time Very High (>250 mg/dL):    50 %   (Goal < 25%)  Time Low (54-69 mg/dL) and Time Very Low (<54 mg/dL):   1 %   (Goal <4%)  Coefficient of variation:   37.5 %   (Goal <36%)  Having postprandial spikes and post prandial hypoglycemia at lunch and dinner  I suspect her bolus amount is too high at times when she eats less like this am broth and 4 salines (should only  need maybe 1-2 units) compared to last night's dinner 12 oz regular soda, 2 bowls of soup and a croissant (probably needs the whole 5 units).  we need her to eat consistent carbs//meal sizes from day to day so we can dose her insulin correctly (I do not think she is ready to figure up her own mealtime insulin doses).  I plan to ask her to write down what she eats an drinks for a few days and bring it in for review.     Diabetes Self-Management Education - 09/15/22 1000       Health Coping   How would you rate your overall health? Good      Complications   How often do you check your blood sugar? > 4 times/day    Fasting Blood glucose range  (mg/dL) 70-129;130-179    Postprandial Blood glucose range (mg/dL) 180-200;>200      Dietary Intake   Breakfast broth, watwer , 4 saltines    Lunch she could remember    Dinner croissant, 2 bowls [place] chicken noddle souip, can o regular soda    Snack (evening) 2 regular sized cookies and water      Activity / Exercise   Activity / Exercise Type --   need to assess as follow up     Patient Education   Previous Diabetes Education Yes (please comment)   here 10 days ago   Monitoring Taught/evaluated CGM (comment)   patient is overwhelmed by the task of puttng on teh sensor and transmitter, unable to finish all aspects of using her CGM     Individualized Goals (developed by patient)   Monitoring  Consistenly use CGM      Patient Self-Evaluation of Goals - Patient rates self as meeting previously set goals (% of time)   Monitoring >75% (most of the time)      Outcomes   Program Status Re-entered      Subsequent Visit  Since your last visit have you continued or begun to take your medications as prescribed? Yes    Since your last visit have you had your blood pressure checked? Yes    Is your most recent blood pressure lower, unchanged, or higher since your last visit? Unchanged    Since your last visit have you experienced any weight changes? Gain    Weight Gain (lbs) 2    Since your last visit, are you checking your blood glucose at least once a day? Yes   using continuous glucose monitor            Learning Objective:  Patient will have a greater understanding of diabetes self-management. Patient education plan is to attend individual and/or group sessions per assessed needs and concerns.   Plan:   There are no Patient Instructions on file for this visit.   Expected Outcomes:  Demonstrated interest in learning. Expect positive outcomes  Education material provided: Diabetes Resources  If problems or questions, patient to contact team via:  Phone  Future DSME  appointment: - Other (comment) (10 days) Amanda [name], RD 09/15/2022 2:06 PM.

## 2022-09-15 NOTE — Progress Notes (Signed)
   CC: 2 wk recheck  HPI:[name] is a 71 y.o. female who presents for evaluation of 2 week recheck. Please see individual problem based A/P for details.  Patient seen in clinic for diabetes follow up. Has been having post-prandial highs followed by symptomatic lows. Reports that she feels jittery, nervous, sweats during periods of lower glucose levels. Lowest cbg seems to be 70. Post-prandial glucose typically in 300s. Has been compliant with her lantus, humalog, jardiance, and rybelsus. She has been taking full 5 units of humalog. She has not yet been approved for Jardiance, but has called [employer] [facility]. Social work assisting too. Has been using samples in meantime.    Depression, PHQ-9: Based on the patients  [place] Visit from 09/15/2022 in [place]  PHQ-9 Total Score 2      score we have .  Past Medical History:  Diagnosis Date   Allergy    SEASONAL   Arthritis    Cervical radiculopathy due to degenerative joint disease of spine 05/03/2017   CKD (chronic kidney disease) stage 3, GFR 30-59 ml/min ([place]) 01/01/2021   Diabetes mellitus 1996   Glaucoma    H. pylori infection    Hyperlipidemia    Hypertension    Normocytic anemia 02/14/2013   Osteopenia 05/04/2016   Pain of both shoulder joints 11/14/2016   Review of Systems:   Review of Systems  Constitutional: Negative.   Eyes: Negative.   Genitourinary: Negative.   Neurological: Negative.      Physical Exam: Vitals:   09/15/22 1019  BP: (!) 131/53  Pulse: 74  Temp: 98.6 F (37 C)  TempSrc: Oral  SpO2: 100%  Weight: 142 lb 11.2 oz (64.7 kg)  Height: 5' 1.5" (1.562 m)     General: NAD HEENT: Conjunctiva nl , antiicteric sclerae, moist mucous membranes, no exudate or erythema Cardiovascular: Normal rate, regular rhythm.  No murmurs, rubs, or gallops Pulmonary : Equal breath sounds, No wheezes, rales, or rhonchi Abdominal: soft, nontender,  bowel sounds  present Ext: No edema in lower extremities, no tenderness to palpation of lower extremities.   Assessment & Plan:   See Encounters Tab for problem based charting.  CGM monitor indicated post-prandial hyperglycemia into 300s with symptomatic lows to 70s. She is taking humalod for mealtime insulin and may benefit from allowing more time between meals and insulin administration - f/u with diabetes educator  Patient discussed with Dr. Evette Doffing

## 2022-09-15 NOTE — Assessment & Plan Note (Addendum)
Patient seen in clinic for diabetes follow up. Has been having post-prandial highs followed by symptomatic lows. Reports that she feels jittery, nervous, sweats during periods of lower glucose levels. Lowest cbg seems to be 70. Post-prandial glucose typically in 300s. Has been compliant with her lantus, humalog, jardiance, and rybelsus. She has been taking full 5 units of humalog. She has not yet been approved for Jardiance, but has called [employer] [facility]. Social work assisting too. Has been using samples in meantime.   CGM monitor indicated post-prandial hyperglycemia into 300s with symptomatic lows to 70s. She is taking humalod for mealtime insulin and may benefit from allowing more time between meals and insulin administration - f/u with diabetes educator

## 2022-09-15 NOTE — Patient Instructions (Addendum)
Dear Mrs. Amanda [name],  Thank you for trusting [place] with your care today. We discussed your diabetes. We recommend you continue taking your regimen as prescribed for right now. To help prevent the high blood sugars after meals followed by the lows, recommend you try takin gyour insulin several minutes earlier before eating.  Please return to the clinic for your appointment with Butch Penny on the 9th.  We would like to see you back in 3 months to follow up on your diabetes as well.

## 2022-09-16 ENCOUNTER — Encounter: Payer: Medicare Other | Admitting: Internal Medicine

## 2022-09-17 ENCOUNTER — Encounter: Payer: Self-pay | Admitting: Internal Medicine

## 2022-09-19 NOTE — Progress Notes (Signed)
Internal Medicine Clinic Attending ° °Case discussed with Dr. Gawaluck  At the time of the visit.  We reviewed the resident’s history and exam and pertinent patient test results.  I agree with the assessment, diagnosis, and plan of care documented in the resident’s note.  °

## 2022-09-22 ENCOUNTER — Telehealth: Payer: Self-pay | Admitting: Dietician

## 2022-09-22 NOTE — Telephone Encounter (Signed)
Called to provided support for use of dexcom Continuous glucose monitoring and interpretation. She is having hypoglycemia but unsure what is causing it. She says she thinks she may be overtreating her low blood sugar when it does happen.  She agree to keep food and activity records over the weekend before we meet on Monday

## 2022-09-26 ENCOUNTER — Encounter: Payer: Self-pay | Admitting: Dietician

## 2022-09-26 ENCOUNTER — Ambulatory Visit (INDEPENDENT_AMBULATORY_CARE_PROVIDER_SITE_OTHER): Payer: Medicare Other | Admitting: Dietician

## 2022-09-26 ENCOUNTER — Other Ambulatory Visit (HOSPITAL_COMMUNITY): Payer: Self-pay

## 2022-09-26 DIAGNOSIS — Z794 Long term (current) use of insulin: Secondary | ICD-10-CM | POA: Diagnosis not present

## 2022-09-26 DIAGNOSIS — E1142 Type 2 diabetes mellitus with diabetic polyneuropathy: Secondary | ICD-10-CM | POA: Diagnosis not present

## 2022-09-26 NOTE — Patient Instructions (Addendum)
Great job putting on a new Public relations account executive today!  To help prevent low blood sugars you said you wan to begin taking your lang acting insulin in the morning.   Dr. Elliot Gurney approved you to decrease your Humalog rapid acting insulin before meals to 4 units each meal.   You can consider adjusting your mealtime insulin according to the size of your meal- for example, a smaller meal might need less insulin, but not no insulin,  a larger meal with more starch and sugary foods might need more insulin. The total amount of Humalog/mealtime insulin you take should be 12 units/day as we discussed today.   Butch Penny [phone]

## 2022-09-26 NOTE — Progress Notes (Signed)
Estimated body mass index is 26.62 kg/m as calculated from the following:   Height as of 09/15/22: 5' 1.5" (1.562 m).   Weight as of this encounter: 143 lb 3.2 oz (65 kg).  Wt Readings from Last 10 Encounters:  09/26/22 143 lb 3.2 oz (65 kg)  09/15/22 142 lb 11.2 oz (64.7 kg)  09/02/22 141 lb 14.4 oz (64.4 kg)  08/19/22 136 lb 6.4 oz (61.9 kg)  07/20/22 132 lb (59.9 kg)  07/12/22 123 lb 4.8 oz (55.9 kg)  06/23/22 131 lb 3.2 oz (59.5 kg)  05/27/22 135 lb 8 oz (61.5 kg)  05/17/22 134 lb (60.8 kg)  04/12/22 125 lb 6.4 oz (56.9 kg)   Diabetes Self-Management Education  Visit Type:  Annual Follow-Up (3rd visit)  Appt. Start Time: 1030 Appt. End Time: 1120  09/26/2022  [name], identified by name and date of birth, is a 71 y.o. female with a diagnosis of Diabetes:  .   ASSESSMENT  Her lows do not appear to be significant, but are frequent ~9 low blood sugars in the past 2 weeks.Overall, her blood sugars are much improved for the previous 2 weeks they have dropped about 15 mg/dL CGM Results from download:   % Time CGM active:   99.2 %   (Goal >70%)  Average glucose:   178 mg/dL for 14 days  Glucose management indicator:   7.6 %  Time in range (70-180 mg/dL):   59 %   (Goal >70%)  Time High (181-250 mg/dL):   25 %   (Goal < 25%)  Time Very High (>250 mg/dL):    15 %   (Goal < 5%)  Time Low (54-69 mg/dL):   1 %   (Goal <4%)  Time Very Low (<54 mg/dL):   <1 %   (Goal <1%)  Coefficient of variation:   36.3 %   (Goal <36%)   Frida came today to restart her Dexcom CGM that stopped yesterday. She is feeling overwhelmed at learning how to use it . She says she is afraid to take both of her insulins in the evening so often skips her mealtime insulin. She is also having low blood sugars post meal ~ 5-6 hours despite taking her lispro about 15 minutes before eating to the best of her ability. She is starting to ask question about meal planning. She did not keep  food records and was  not ready today to discuss meal planning or details of her CGM download.  I spoke to dr Elliot Gurney about her desire to move her long acting insulin to the morning and to reduce her meal time insulin to 4 units three times a day. She agrees that she is eating defensively due to to fear of hypoglycemia.   Her current weight is acceptable, but would not recommend any more weight gain.   Weight 143 lb 3.2 oz (65 kg). Body mass index is 26.62 kg/m.    Diabetes Self-Management Education - 09/26/22 1100       Health Coping   How would you rate your overall health? Good      Psychosocial Assessment   What is the hardest part about your diabetes right now, causing you the most concern, or is the most worrisome to you about your diabetes?   Taking/obtaining medications;Making healty food and beverage choices      Dietary Intake   Breakfast smallest meal    Lunch largest meal    Dinner says she eats too late  Snack (evening) eats due to fear of hypoglycemia    Beverage(s) water, soda      Activity / Exercise   Activity / Exercise Type ADL's;Light (walking / raking leaves)    How many days per week do you exercise? 3    How many minutes per day do you exercise? 20    Total minutes per week of exercise 60      Patient Education   Previous Diabetes Education Yes (please comment)    Monitoring Taught/evaluated CGM (comment)    Acute complications Taught prevention, symptoms, and  treatment of hypoglycemia - the 15 rule.;Discussed and identified patients' prevention, symptoms, and treatment of hyperglycemia.      Individualized Goals (developed by patient)   Monitoring  Consistenly use CGM      Patient Self-Evaluation of Goals - Patient rates self as meeting previously set goals (% of time)   Monitoring >75% (most of the time)   frustrated that she feels overwhelmed with CGM and is thinking of going back to only using finger stick     Outcomes   Program Status Completed   will refer her  for Medical Nutrition Therapy as she is interested in more meal planning today.     Subsequent Visit   Since your last visit have you continued or begun to take your medications as prescribed? Yes   misses taking Humalog ~ 3 meals per week reportd due to fear of taking it at night with her long acting insulin. she also misses tkaing rybelsus on an empty stomach   Since your last visit have you had your blood pressure checked? No    Since your last visit have you experienced any weight changes? Gain    Weight Gain (lbs) 1   she is troubled with her weight gain. clothes do not fit, etc,   Since your last visit, are you checking your blood glucose at least once a day? Yes   usng dexcom and meter            Learning Objective:  Patient will have a greater understanding of diabetes self-management. Patient education plan is to attend individual and/or group sessions per assessed needs and concerns.   Plan:   Patient Instructions  Doristine Devoid job putting on a new sensor and transmitter today!  To help prevent low blood sugars you said you wan to begin taking your lang acting insulin in the morning.   Dr. Elliot Gurney approved you to decrease your Humalog rapid acting insulin before meals to 4 units each meal.   I encourage you to adjust your mealtine insulin accordingly to the size of your meal- for example, a smaller meal might need less insulin, but not no insulin, a larger meal with more starch and sugary foods might need you need more insulin.   Butch Penny [phone]    Expected Outcomes:  Demonstrated interest in learning. Expect positive outcomes  Education material provided: Diabetes Resources  If problems or questions, patient to contact team via:  Phone  Future DSME appointment: - Other (comment) 10 days for Medical Nutrition Therapy and CGM fu to help her build her self confidence [name], RD 09/26/2022 11:58 AM.

## 2022-09-27 ENCOUNTER — Other Ambulatory Visit (HOSPITAL_COMMUNITY): Payer: Self-pay

## 2022-09-28 ENCOUNTER — Other Ambulatory Visit (HOSPITAL_COMMUNITY): Payer: Self-pay

## 2022-09-28 ENCOUNTER — Other Ambulatory Visit: Payer: Self-pay | Admitting: Internal Medicine

## 2022-09-28 DIAGNOSIS — I10 Essential (primary) hypertension: Secondary | ICD-10-CM

## 2022-09-28 MED ORDER — OLMESARTAN MEDOXOMIL 40 MG PO TABS
40.0000 mg | ORAL_TABLET | Freq: Every day | ORAL | 2 refills | Status: DC
  Filled 2022-09-28: qty 30, 30d supply, fill #0
  Filled 2022-09-28 (×2): qty 90, 90d supply, fill #0
  Filled 2022-11-02: qty 30, 30d supply, fill #1
  Filled 2022-12-06: qty 30, 30d supply, fill #2

## 2022-09-29 ENCOUNTER — Other Ambulatory Visit (HOSPITAL_COMMUNITY): Payer: Self-pay

## 2022-10-06 ENCOUNTER — Encounter: Payer: Self-pay | Admitting: Dietician

## 2022-10-06 ENCOUNTER — Ambulatory Visit (INDEPENDENT_AMBULATORY_CARE_PROVIDER_SITE_OTHER): Payer: Medicare Other | Admitting: Dietician

## 2022-10-06 DIAGNOSIS — Z794 Long term (current) use of insulin: Secondary | ICD-10-CM | POA: Diagnosis not present

## 2022-10-06 DIAGNOSIS — E1142 Type 2 diabetes mellitus with diabetic polyneuropathy: Secondary | ICD-10-CM | POA: Diagnosis not present

## 2022-10-06 NOTE — Progress Notes (Signed)
Medical Nutrition Therapy:  Appt start time: 0815 end time:  0915. Total time: 60 Visit # 1/9th from 2022  Assessment:  Primary concerns today: glycemic control/hypoglycemia.  Amanda [name] is getting comfortable with using her CGM and understanding what the information it gives her means, but still needs to build her confidence. She has stress at home caring for her spouse which she state is adding to her burden of caring for here own diabetes. However, she states the CGM is helping and making it easier. Today she is asking questions about her food and how much insulin she needs for it. Gave her some examples using  ICR ~15  She states the hypoglycemia really scares her and she sometimes does not take her meal time insulin due to this fear.   Preferred Learning Style: No preference indicated  Learning Readiness: Ready and  Change in progress  ANTHROPOMETRICS: Estimated body mass index is 26.62 kg/m as calculated from the following:   Height as of 09/15/22: 5' 1.5" (1.562 m).   Weight as of 09/26/22: 143 lb 3.2 oz (65 kg).  WEIGHT HISTORY:  Wt Readings from Last 10 Encounters:  09/26/22 143 lb 3.2 oz (65 kg)  09/15/22 142 lb 11.2 oz (64.7 kg)  09/02/22 141 lb 14.4 oz (64.4 kg)  08/19/22 136 lb 6.4 oz (61.9 kg)  07/20/22 132 lb (59.9 kg)  07/12/22 123 lb 4.8 oz (55.9 kg)  06/23/22 131 lb 3.2 oz (59.5 kg)  05/27/22 135 lb 8 oz (61.5 kg)  05/17/22 134 lb (60.8 kg)  04/12/22 125 lb 6.4 oz (56.9 kg)   SLEEP:need to assess at future visit  MEDICATIONS: lantus 12 units qam, Humalog 4 units 2-3 times daily BLOOD SUGAR: Lab Results  Component Value Date   HGBA1C 10.9 (A) 08/19/2022   HGBA1C 7.9 (A) 05/17/2022   HGBA1C 8.4 (A) 02/03/2022   HGBA1C 7.4 (A) 11/17/2021   HGBA1C 6.9 (A) 09/02/2021    Await repeat a1c in December.   CGM Results from download:  last download 10 days ago  Last 14 days  % Time CGM active:   99.2 %   (Goal >70%) 91.5%  Average glucose:   178 mg/dL for 14 days   164 mg/dL for last 14 days  Glucose management indicator:   7.6 %  7.2%  Time in range (70-180 mg/dL):   59 %   (Goal >70%)  69%  Time High (181-250 mg/dL):   25 %   (Goal < 25%)  23%  Time Very High (>250 mg/dL):    15 %   (Goal < 5%)    8%  Time Low (54-69 mg/dL):   1 %   (Goal <4%)    0%  Time Very Low (<54 mg/dL):   <1 %   (Goal <1%)  <1%  Coefficient of variation:   36.3 %   (Goal <36%)   33.1%   Overall her blood glucose is improved from last download but again like last visit, her hypoglycemia is bothering and scaring her. It is happening mostly after meals, but as she says sometimes it happens even when  she has not taken her mealtime insulin.   DIETARY INTAKE: Usual eating pattern includes 3 meals and 0-1 snacks per day. Dining Out (times/week):need to assess at future visit 24-hr recall:  B ( AM): sometimes a small bowl of cornflakes, milk, sometimes half a banana L ( 11- 2 PM): 8 oz sweet tea, half a sandwich D (6-830 PM): regular soda,  sandwich Snk ( PM): cheerios and milk Beverages: 8 oz sweet tea, 12 oz regular soda at times, water  Usual physical activity: not discussed today  Progress Towards Goal(s):  In progress.   Nutritional Diagnosis:  NB-1.1 Food and nutrition-related knowledge deficit As related to new CGM and lack of sufficient diabetes self management training.  As evidenced by her report and questions about food intake.    Intervention:  Nutrition education about timing of insulin before meals, carb content of foods tht sh consumes, CGM report, causes, treatments and prevention of hypoglycemia. Action Goal:take some insulin even if less than the usual meal dose if you eat instead of skipping it, take  Outcome goal:  Coordination of care: recommend trying longer acting basal insulin Tresiba at 11 units and ultra rapid acting mealtime insulin (Lyumjev) 2-4- 4  Teaching Method Utilized: Visual, Auditory,Hands on Handouts given during visit include: After visit  summary Barriers to learning/adherence to lifestyle change: caring for very ill spouse who just got out of hospital Demonstrated degree of understanding via:  Teach Back   Monitoring/Evaluation:  Dietary intake, exercise, CGM, and body weight  11 daysDonna Xena [name], RD 10/06/2022 2:26 PM. .

## 2022-10-06 NOTE — Patient Instructions (Addendum)
Every 4 ounces of sweet drink= 1 unit Humalog  8 ounces of sweet drink= 2 units Humalog  12 ounces sweet drink = 3 units Humalog  Sweet drink = soda, juice or sweet tea   Half a sandwich = 1 unit Humalog  Whole sandwich = 2 units Humalog   Cereal and milk=2 units Humalog  Ceareal, milk and half banana= 3 units Humalog  See you at 8:15 AM October 30th Monday. (Your appointment says 10:15 am but disregard that)   Butch Penny [phone]

## 2022-10-10 ENCOUNTER — Other Ambulatory Visit (HOSPITAL_COMMUNITY): Payer: Self-pay

## 2022-10-11 ENCOUNTER — Other Ambulatory Visit (INDEPENDENT_AMBULATORY_CARE_PROVIDER_SITE_OTHER): Payer: Medicare Other | Admitting: Internal Medicine

## 2022-10-11 ENCOUNTER — Other Ambulatory Visit (HOSPITAL_COMMUNITY): Payer: Self-pay

## 2022-10-11 DIAGNOSIS — Z794 Long term (current) use of insulin: Secondary | ICD-10-CM

## 2022-10-11 DIAGNOSIS — E1121 Type 2 diabetes mellitus with diabetic nephropathy: Secondary | ICD-10-CM

## 2022-10-11 DIAGNOSIS — E1142 Type 2 diabetes mellitus with diabetic polyneuropathy: Secondary | ICD-10-CM

## 2022-10-11 MED ORDER — LYUMJEV KWIKPEN 100 UNIT/ML ~~LOC~~ SOPN
PEN_INJECTOR | SUBCUTANEOUS | 12 refills | Status: DC
  Filled 2022-10-11: qty 3, 30d supply, fill #0
  Filled 2022-11-14: qty 3, 30d supply, fill #1
  Filled 2023-01-12: qty 3, 30d supply, fill #2

## 2022-10-11 MED ORDER — TRESIBA FLEXTOUCH 100 UNIT/ML ~~LOC~~ SOPN
11.0000 [IU] | PEN_INJECTOR | Freq: Every day | SUBCUTANEOUS | 12 refills | Status: DC
  Filled 2022-10-11: qty 3, 27d supply, fill #0
  Filled 2022-11-14: qty 3, 27d supply, fill #1
  Filled 2022-12-16: qty 3, 27d supply, fill #2
  Filled 2023-01-12: qty 3, 27d supply, fill #3

## 2022-10-11 NOTE — Progress Notes (Signed)
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  She was recently started on insulin and has been following with diabetic coordinator within internal medicine clinic, Butch Penny.  Patient has been having difficulty with hypoglycemia episodes.  Has met with Butch Penny 2 times since last office visit in September.  Butch Penny making recommendations to switch to degludec and lispro in an effort to stop hypoglycemia episodes.  She has had difficulty obtaining medications in the past due to cost. Tyler Aas 27-day supply last is $33.58 and Lyumjev is $34.99.  I called and talked with patient about cost.  She states that Humalog and Lantus were less costly, but she is open to trying other insulins if they help decrease amount of hypoglycemia episodes. Plan: Tresiba pen 11 units nightly Lyumjev pen 2 units with breakfast, and 4 units with lunch, 4 units with dinner Insulin supply was ordered in 30-day increments due to cost. Discontinued Lantus and Humalog

## 2022-10-12 ENCOUNTER — Other Ambulatory Visit (HOSPITAL_COMMUNITY): Payer: Self-pay

## 2022-10-12 MED ORDER — DEXCOM G7 RECEIVER DEVI
0 refills | Status: DC
  Filled 2022-10-12: qty 1, 30d supply, fill #0
  Filled 2022-10-19: qty 1, 1d supply, fill #0

## 2022-10-12 MED ORDER — DEXCOM G7 SENSOR MISC
0 refills | Status: DC
  Filled 2022-10-12: qty 9, 90d supply, fill #0
  Filled 2022-10-19: qty 9, 84d supply, fill #0

## 2022-10-13 ENCOUNTER — Other Ambulatory Visit (HOSPITAL_COMMUNITY): Payer: Self-pay

## 2022-10-14 ENCOUNTER — Other Ambulatory Visit (HOSPITAL_COMMUNITY): Payer: Self-pay

## 2022-10-17 ENCOUNTER — Encounter: Payer: Self-pay | Admitting: Dietician

## 2022-10-17 ENCOUNTER — Ambulatory Visit (INDEPENDENT_AMBULATORY_CARE_PROVIDER_SITE_OTHER): Payer: Medicare Other | Admitting: Dietician

## 2022-10-17 DIAGNOSIS — Z794 Long term (current) use of insulin: Secondary | ICD-10-CM | POA: Diagnosis not present

## 2022-10-17 DIAGNOSIS — E1142 Type 2 diabetes mellitus with diabetic polyneuropathy: Secondary | ICD-10-CM | POA: Diagnosis not present

## 2022-10-17 NOTE — Patient Instructions (Addendum)
Ask [employer] if they are going to give you a free receiver for the Dexcom G7 or you can call me with their number.   Before eating check blood sugar:   To figure how much insulin to take for food: Basically 1 unit Lyumjev for about a carbohydrate serving,   2 units for 2 servings, 3 units for 3 servings, etc.   To correct high blood sugar:  Use the insulin adjustments provided today. Would use correction when blood sugar greater than 130-140 mg/dL before your meal and trend arrow is pointed straight upward.   ADD  the correction insulin to the food insulin to know how much to take before eating. Best to take Lyumjev  before or with first bite of food/carbs, but can take correction about 30-45 minutes after eating if you think you under dosed before your meal.   Butch Penny [phone]

## 2022-10-17 NOTE — Progress Notes (Signed)
Medical Nutrition Therapy:  Appt start time: 0815 end time:  0859. Total time: 44 minutes Visit # 2 in 2023  Assessment:  Primary concerns today: glycemic control/hypoglycemia.  Amanda [name] is getting comfortable with using her CGM and understanding what the information it gives her means, but still needs to build her confidence. She also started two new insulins on 10-13-22.    Today she is asking more questions about her food and how much insulin she needs for it. Gave her some examples using  ICR ~15. She does not want to gain weight. This is appropriate as her BMI is currently at target for her height and age.   She states the hypoglycemia really scares her and she sometimes does not take her meal time insulin due to this fear.   ANTHROPOMETRICS: Estimated body mass index is 26.62 kg/m as calculated from the following:   Height as of 09/15/22: 5' 1.5" (1.562 m).   Weight as of 09/26/22: 143 lb 3.2 oz (65 kg).  Total daily dose~ 20-22 units, use 22 Insulin to carb ratio ~ 20-23, use 20 Correction factor ~82, correct above 180 mg/dl   SLEEP:need to assess at future visit  MEDICATIONS: Tresiba 11 units nightly, Lyumjev 2-3 units 2-3 times daily BLOOD SUGAR: Await repeat a1c in December.   CGM Results from download:  10/22 through 10/25  10/26 through 10/29  % Time CGM active:   100 %   (Goal >70%)  75%  Average glucose:   149 mg/dL for 14 days  141 mg/dL for last 14 days      Time in range (70-180 mg/dL):   75 %   (Goal >70%)  79%  Time High (181-250 mg/dL):   21 %   (Goal < 25%)  18%  Time Very High (>250 mg/dL):    1 %   (Goal < 5%)    3%  Time Low (54-69 mg/dL):   1 %   (Goal <4%)    0%  Time Very Low (<54 mg/dL):   <1 %   (Goal <1%)    0%       Overall her blood glucose is improved from last download but again like last visit, her hypoglycemia is bothering her. She did not realize that she did not have any hypoglycemia since her new insulin until last night when she took 2 units  ~ 815 PM of lyumjev and only ate a half sandwich and drank water for a late dinner. Her blood sugar was ~ 85 -95 at bedtime 65 this am and then 79 after 4 o juice, then 71 here in the office. Will monitor as this seems to be more of a mismatch of prandial insulin to carb intake rather than her basal insulin dose.   Started new insulin on 10-13-22. Using 2-3 units lyumjev 2-3 times daily and 11 units [place]. No hypoglycemia noted since new regimen started.   DIETARY INTAKE:not done in detail today  Usual physical activity: discussed affect on blood sugar and insulin doses  Progress Towards Goal(s):  Some progress.   Nutritional Diagnosis:  NB-1.1 Food and nutrition-related knowledge deficit As related to new CGM and lack of sufficient diabetes self management training.  As evidenced by her report and questions about food intake.    Intervention:  Nutrition education about timing of insulin before meals, carb content of foods that she consumes, CGM report, causes, treatments and prevention of hypoglycemia. Action Goal:take some insulin even if less than the usual  meal dose if you eat instead of skipping it, take  Outcome goal: less hypoglcyemia and hyperglycemia Coordination of care: discussed with Dr. Howie Ill using a correction insulin starting at 140 mg/dl using her trend arrows.   Teaching Method Utilized: Visual, Auditory,Hands on Handouts given during visit include: After visit summary Barriers to learning/adherence to lifestyle change: caring for very ill spouse who just got out of hospital Demonstrated degree of understanding via:  Teach Back   Monitoring/Evaluation:  Dietary intake, exercise, CGM, and body weight  9 daysDonna Jeriyah [name], RD 10/17/2022 10:06 AM. .

## 2022-10-19 ENCOUNTER — Other Ambulatory Visit (HOSPITAL_COMMUNITY): Payer: Self-pay

## 2022-10-20 NOTE — Telephone Encounter (Signed)
ERROR

## 2022-10-24 ENCOUNTER — Other Ambulatory Visit (HOSPITAL_COMMUNITY): Payer: Self-pay

## 2022-10-26 ENCOUNTER — Other Ambulatory Visit (HOSPITAL_COMMUNITY): Payer: Self-pay

## 2022-10-26 ENCOUNTER — Encounter: Payer: Self-pay | Admitting: Dietician

## 2022-10-26 ENCOUNTER — Ambulatory Visit (INDEPENDENT_AMBULATORY_CARE_PROVIDER_SITE_OTHER): Payer: Medicare Other | Admitting: Dietician

## 2022-10-26 ENCOUNTER — Encounter: Payer: Self-pay | Admitting: Internal Medicine

## 2022-10-26 DIAGNOSIS — Z794 Long term (current) use of insulin: Secondary | ICD-10-CM | POA: Diagnosis not present

## 2022-10-26 DIAGNOSIS — E1142 Type 2 diabetes mellitus with diabetic polyneuropathy: Secondary | ICD-10-CM

## 2022-10-26 NOTE — Patient Instructions (Addendum)
Esparanza- you are doing GREAT!!!  Your blood sugars are greatly improved!   To prevent after meal spikes and low blood sugar between meals:   Please try to take Lyumjev with first bite of food every time you eat unless blood sugar is less than 80. If less than 80 eat a little bit to get blood sugar up, then take Lyumjev ad eat your usual meal.   You do not need to add extra insulin for high blood sugar unless your blood sugar is more than 140 before eating and your trend arrow is pointing upward.   When you walk in the evening, I suggest being sure you blood sugar is about 140-160 at bedtime by having a snack. Whole bread or crackers and peanut butter.   You can follow up with Dr. Howie Ill after the first of December.   Butch Penny [phone]

## 2022-10-26 NOTE — Progress Notes (Signed)
  Medical Nutrition Therapy:  Appt start time: 8299 end time:  1615. Total time: 60 minutes Visit # 3 in 2023  Assessment:  Primary concerns today: glycemic control/hypoglycemia and CGM training  Ms Awe is getting comfortable with using her Dexcom G6 CGM and brings a new Dexcom G7 CGM with her today to use. She states she only takes 2 units of meal time insulin and this is after eating and her blood sugar is higher with trend arrow understanding what the information it gives her means, but still needs to build her confidence. She also mentions that she is trying to walk more and is not doing it daily and started last week. This coincides with two episodes of nocturnal hypoglycemia in early am.   ANTHROPOMETRICS: Estimated body mass index is 26.62 kg/m as calculated from the following:   Height as of 09/15/22: 5' 1.5" (1.562 m).   Weight as of 09/26/22: 143 lb 3.2 oz (65 kg).  Total daily dose~ 20-22 units, using 17   SLEEP:need to assess at future visit  MEDICATIONS: Tresiba 11 units nightly, Lyumjev 2-3 units 2-3 times daily BLOOD SUGAR: Await repeat a1c in December.   CGM Results from download:  10/26 through 11/8  % Time CGM active:   92.1 %   (Goal >70%)  Average glucose:   142 mg/dL for 14 days  GMI   6.7%  Time in range (70-180 mg/dL):   79 %   (Goal >70%)  Time High (181-250 mg/dL):   16 %   (Goal < 25%)  Time Very High (>250 mg/dL):    4 %   (Goal < 5%)  Time Low (54-69 mg/dL):   1 %   (Goal <4%)  Time Very Low (<54 mg/dL):    0%   (Goal <1%)      Overall her blood glucose is improved from last download but again, her hypoglycemia is bothering her. "Do I still take the same amount of Tyler Aas? "  Her lows seems to be due to the timing of her insulin after her meals and her increased activity.  monitor as this seems to be more of a mismatch of prandial insulin to carb intake rather than her total basal insulin dose and her varying activity level.    DIETARY INTAKE:not done in  detail today  Usual physical activity: discussed affect on blood sugar and insulin doses  Progress Towards Goal(s):  Some progress.   Nutritional Diagnosis:  NB-1.1 Food and nutrition-related knowledge deficit As related to new CGM and lack of sufficient diabetes self management training.  As evidenced by her report and questions about food intake.    Intervention:  Nutrition education about timing of insulin before meals, carb content of foods that she consumes, CGM report, causes, treatments and prevention of hypoglycemia. Action Goal:take some insulin even if less than the usual meal dose if you eat instead of skipping it, take  Outcome goal: less hypoglcyemia and hyperglycemia Coordination of care: discuss CGM report with Dr. Howie Ill; request 2nd referral in same calendar year    Teaching Method Utilized: Visual, Auditory,Hands on Handouts given during visit include: After visit summary Barriers to learning/adherence to lifestyle change: caring for very ill spouse who just got out of hospital Demonstrated degree of understanding via:  Teach Back   Monitoring/Evaluation:  Dietary intake, exercise, CGM, and body weight  11 daysDonna Lumina Gitto, RD 10/26/2022 4:42 PM. .

## 2022-11-02 ENCOUNTER — Other Ambulatory Visit (HOSPITAL_COMMUNITY): Payer: Self-pay

## 2022-11-03 ENCOUNTER — Other Ambulatory Visit: Payer: Self-pay | Admitting: Internal Medicine

## 2022-11-03 DIAGNOSIS — R928 Other abnormal and inconclusive findings on diagnostic imaging of breast: Secondary | ICD-10-CM

## 2022-11-03 DIAGNOSIS — Z1231 Encounter for screening mammogram for malignant neoplasm of breast: Secondary | ICD-10-CM

## 2022-11-07 ENCOUNTER — Encounter: Payer: Self-pay | Admitting: Dietician

## 2022-11-07 ENCOUNTER — Other Ambulatory Visit (HOSPITAL_COMMUNITY): Payer: Self-pay

## 2022-11-07 ENCOUNTER — Other Ambulatory Visit: Payer: Self-pay | Admitting: Dietician

## 2022-11-07 ENCOUNTER — Ambulatory Visit: Payer: Medicare Other | Admitting: Dietician

## 2022-11-07 ENCOUNTER — Other Ambulatory Visit: Payer: Self-pay | Admitting: Internal Medicine

## 2022-11-07 DIAGNOSIS — Z794 Long term (current) use of insulin: Secondary | ICD-10-CM

## 2022-11-07 MED ORDER — DEXCOM G7 SENSOR MISC
2 refills | Status: DC
  Filled 2022-11-07: qty 9, 84d supply, fill #0
  Filled 2023-02-20: qty 9, 90d supply, fill #0
  Filled 2023-04-28 – 2023-05-01 (×2): qty 9, 90d supply, fill #1
  Filled 2023-08-16: qty 9, 90d supply, fill #2

## 2022-11-07 NOTE — Progress Notes (Signed)
Referral placed to diabetes coordinator.

## 2022-11-07 NOTE — Progress Notes (Signed)
  Medical Nutrition Therapy:  Appt start time: 5364 end time:  1450. Total time: 35 minutes Visit # 4 in 2023  Assessment:  Primary concerns today: glycemic control/hypoglycemia and CGM training  Ms Gelber is getting comfortable using Dexcom G7 CGM. She put a new sensor on at today's visits. She states she takes 2 units of Lyumjev at breakfast, usually 3 for lunch and dinner. This coincides with three episodes of hypoglycemia after lunch between 12-2 PM. She also had 4 overnight and fasting between 6-8 AM.  She states she is still working on building her confidence with CGM. She states the last week has been hectic with her family life.  She states she is interested in using the [employer] so she can dose in half units   SLEEP:need to assess at future visit MEDICATIONS: Total daily dose~ 11 tresiba and ~8 units of Lyumjev, using 19  BLOOD SUGAR: Await repeat a1c in December.   CGM Results from download:  11/6 through 11/19  % Time CGM active:   100 %   (Goal >70%)  Average glucose:   153 mg/dL for 14 days  GMI   7 %  Time in range (70-180 mg/dL):   73 %   (Goal >70%)  Time High (181-250 mg/dL):   20 %   (Goal < 25%)  Time Very High (>250 mg/dL):    6 %   (Goal < 5%)  Time Low (54-69 mg/dL):   1 %   (Goal <4%)  Time Very Low (<54 mg/dL):    <1%   (Goal <1%)      Overall her blood glucose is improved.  DIETARY INTAKE:not done in detail today Usual physical activity: discussed affect on blood sugar and insulin doses  Progress Towards Goal(s):  Some progress.   Nutritional Diagnosis:  NB-1.1 Food and nutrition-related knowledge deficit As related to new CGM and lack of sufficient diabetes self management training.  As evidenced by her report and questions about food intake.    Intervention:  Nutrition education about CGM report, causes, treatments and prevention of hypoglycemia. Action Goal:take some insulin even if less than the usual meal dose if you eat instead of skipping  it.   Outcome goal: less hypoglcyemia and hyperglycemia Coordination of care: roue note to Dr. Howie Ill; suggest changing to the [facility] Junior so she can dose 2.5 units with lunch.    Teaching Method Utilized: Visual, Auditory,Hands on Handouts given during visit include: After visit summary Barriers to learning/adherence to lifestyle change: caring for very ill spouse who just got out of hospital Demonstrated degree of understanding via:  Teach Back   Monitoring/Evaluation:  Dietary intake, exercise, CGM, and body weight  10 daysDonna Normal Recinos, RD 11/07/2022 2:14 PM. .

## 2022-11-07 NOTE — Telephone Encounter (Signed)
Refill send for sensor.

## 2022-11-07 NOTE — Telephone Encounter (Signed)
She called because she cannot find her sensors and hers ran out yesterday and she has an appointment to place one today. We agreed to request a refill and use a sample G7 sensor today.

## 2022-11-07 NOTE — Patient Instructions (Addendum)
Great Job!!!   Blood sugar are doing wonderful!!   I'll ask Dr. Howie Ill to order [employer] Junior so you can doe sin half units if needed.  Butch Penny [phone]

## 2022-11-08 ENCOUNTER — Telehealth: Payer: Self-pay | Admitting: Dietician

## 2022-11-08 NOTE — Telephone Encounter (Signed)
Ms. Stoll called asking about patient assistance or samples of Jardiance 10 mg as she cannot afford it and has been working with our pharmacy Technician to obtain it. We do not have samples of Jardiance 10 mg.

## 2022-11-09 ENCOUNTER — Other Ambulatory Visit (HOSPITAL_COMMUNITY): Payer: Self-pay

## 2022-11-09 ENCOUNTER — Telehealth: Payer: Self-pay

## 2022-11-09 NOTE — Telephone Encounter (Signed)
Submitted application for [employer] to [employer]) for patient assistance.   Phone: [phone]

## 2022-11-14 ENCOUNTER — Other Ambulatory Visit (HOSPITAL_COMMUNITY): Payer: Self-pay

## 2022-11-14 ENCOUNTER — Encounter: Payer: Self-pay | Admitting: Internal Medicine

## 2022-11-14 NOTE — Progress Notes (Signed)
Patient expressed interest in using Amanda [name] pen to allow her to dose insulin in 1/2 units while she was working with Butch Penny over the last few weeks. I attempted to order lyumjev Amanda [name] [employer], however this is not avaliable in [place] currently. P: She is due for 3 month A1c at beginning of December.  -appt scheduled with Dr. Lisabeth Devoid after patients appt with Butch Penny 11/30.   She can either switch to [employer] or continue with lyumjev.

## 2022-11-17 ENCOUNTER — Other Ambulatory Visit (HOSPITAL_COMMUNITY): Payer: Self-pay

## 2022-11-17 ENCOUNTER — Encounter: Payer: Self-pay | Admitting: Dietician

## 2022-11-17 ENCOUNTER — Encounter: Payer: Self-pay | Admitting: Student

## 2022-11-17 ENCOUNTER — Ambulatory Visit (INDEPENDENT_AMBULATORY_CARE_PROVIDER_SITE_OTHER): Payer: Medicare Other | Admitting: Student

## 2022-11-17 ENCOUNTER — Ambulatory Visit (INDEPENDENT_AMBULATORY_CARE_PROVIDER_SITE_OTHER): Payer: Medicare Other | Admitting: Dietician

## 2022-11-17 VITALS — BP 116/81 | HR 76 | Temp 98.0°F | Ht 61.5 in | Wt 145.8 lb

## 2022-11-17 VITALS — Wt 145.8 lb

## 2022-11-17 DIAGNOSIS — M79641 Pain in right hand: Secondary | ICD-10-CM

## 2022-11-17 DIAGNOSIS — Z794 Long term (current) use of insulin: Secondary | ICD-10-CM

## 2022-11-17 DIAGNOSIS — M79642 Pain in left hand: Secondary | ICD-10-CM | POA: Diagnosis not present

## 2022-11-17 DIAGNOSIS — E1142 Type 2 diabetes mellitus with diabetic polyneuropathy: Secondary | ICD-10-CM

## 2022-11-17 DIAGNOSIS — M79662 Pain in left lower leg: Secondary | ICD-10-CM | POA: Diagnosis not present

## 2022-11-17 LAB — GLUCOSE, CAPILLARY: Glucose-Capillary: 88 mg/dL (ref 70–99)

## 2022-11-17 LAB — POCT GLYCOSYLATED HEMOGLOBIN (HGB A1C): Hemoglobin A1C: 7.7 % — AB (ref 4.0–5.6)

## 2022-11-17 MED ORDER — DICLOFENAC SODIUM 1 % EX GEL
2.0000 g | Freq: Every day | CUTANEOUS | 2 refills | Status: AC | PRN
  Filled 2022-11-17: qty 100, 50d supply, fill #0

## 2022-11-17 MED ORDER — XULTOPHY 100-3.6 UNIT-MG/ML ~~LOC~~ SOPN
10.0000 [IU] | PEN_INJECTOR | Freq: Every day | SUBCUTANEOUS | 2 refills | Status: DC
  Filled 2022-11-17: qty 9, 90d supply, fill #0

## 2022-11-17 NOTE — Patient Instructions (Addendum)
Diabetes Please continue tresiba 11 units daily Continue the lyumjev 2-3-3 units with meal, go down by 1 units it not having a full meal Continue rybelsus 7 mg daily I will look into the assistance for jardiance. Please restart this when you are able to pick it up   I am also putting in a prescription of Xultophy it is a combination of tresiba and a medication like rybelsus If it is not too expensive please start using 10 units daily and stop tresiba and rybelsus.    Hand pain I think this is likely due to osteoarthritis. Please try voltaren gel to help with the pain. I would also look into other ways to crush medications to help avoid stress on your joints    Follow up in 4 weeks

## 2022-11-17 NOTE — Progress Notes (Signed)
Medical Nutrition Therapy:  Appt start time: 1572 end time:  1450. Total time: 35 minutes Visit # 5 in 2023  Assessment:  Primary concerns today: glycemic control/hypoglycemia; CGM training, weight management Ms Ponti is getting comfortable using Dexcom G7 CGM. She put a new sensor on at today's visit. She states she takes 2 units of Lyumjev at breakfast, usually 3 for lunch and dinner.  She states she is interested in using the [employer] so she can dose in half units.   She stats that she forgot to take [place] one night, is more of a morning person and would like to start taking it in the morning.  She states she did not miss mealtime insulin but sometimes took it after starting to eat.  She says she has been snacking more on chips, cookies and drinking more regular soda. However, she does not like the weight gain, so is planning to reduce her intake of these foods.   Had a low blood sugar between changing sensors today, her old sensor read 54 with flat trend arrow and 25 minutes later her new sensor read 47. Her symptom was blurred vision that cleared after treatment with 4 glucose tablets. She took 3 units for lunch today about 1230 PM which was earlier than usual and did not eat her usual amount. I encouraged her to adjust her meal time insulin according to her intake as we have been discussing.    Sleep- she describes it as "not good", bedtime 11 PM or after, wakes about 430 am, gets out of bed by 630 am, naps some days but not often   MEDICATIONS: Total daily dose: 19 units per day.  11 units Tresiba and ~8 units of Lyumjev.   BLOOD SUGAR: Lab Results  Component Value Date   HGBA1C 7.7 (A) 11/17/2022   HGBA1C 10.9 (A) 08/19/2022   HGBA1C 7.9 (A) 05/17/2022   HGBA1C 8.4 (A) 02/03/2022   HGBA1C 7.4 (A) 11/17/2021      CGM Results from download:  11/17 through 11/30  % Time CGM active:    89 %   (Goal >70%)  Average glucose:   161 mg/dL for 14 days  GMI   7.2 %   Time in range (70-180 mg/dL):   68 %   (Goal >70%)  Time High (181-250 mg/dL):   24 %   (Goal < 25%)  Time Very High (>250 mg/dL):    8 %   (Goal < 5%)  Time Low (54-69 mg/dL):   0 %   (Goal <4%)  Time Very Low (<54 mg/dL):    0 %   (Goal <1%)      Wt Readings from Last 10 Encounters:  11/17/22 145 lb 12.8 oz (66.1 kg)  11/17/22 145 lb 12.8 oz (66.1 kg)  09/26/22 143 lb 3.2 oz (65 kg)  09/15/22 142 lb 11.2 oz (64.7 kg)  09/02/22 141 lb 14.4 oz (64.4 kg)  08/19/22 136 lb 6.4 oz (61.9 kg)  07/20/22 132 lb (59.9 kg)  07/12/22 123 lb 4.8 oz (55.9 kg)  06/23/22 131 lb 3.2 oz (59.5 kg)  05/27/22 135 lb 8 oz (61.5 kg)    Overall her blood glucose is improved. Her weight is gradually rising.  DIETARY INTAKE:coffee with 2 sugar and creamer, lunch is largest meal usually, but has been drinking regular soda more often and snacking Usual physical activity: discussed affect on blood sugar and insulin doses  Progress Towards Goal(s):  Some progress.  Nutritional Diagnosis:  NB-1.1 Food and nutrition-related knowledge deficit As related to new CGM and lack of sufficient diabetes self management training.  As evidenced by her report and questions about food intake.    Intervention:  Nutrition education about CGM report, causes, treatments and prevention of hypoglycemia, ideas to help stop weight gain. Action Goal: has met her goal to take some insulin with each meal; now working on not gaining weight by making changes to her meal plan  Outcome goal: less hypoglcyemia and hyperglycemia Coordination of care: unable to get [employer] in the [place] yet discussed with Drs. Lisabeth Devoid and [name], blood sugars discussed with Dr. Lisabeth Devoid    Teaching Method Utilized: Visual, Auditory,Hands on Handouts given during visit include: After visit summary Barriers to learning/adherence to lifestyle change: caring for very ill spouse  Demonstrated degree of understanding via:  Teach Back   Monitoring/Evaluation:   Dietary intake, exercise, CGM, and body weight  10 daysDonna Justyna Timoney, RD 11/17/2022 3:43 PM. .

## 2022-11-17 NOTE — Patient Instructions (Addendum)
To help stop weight gain suggest decreasing starchy foods and sugary foods like:   Sugar in coffee  Soft drinks  Chips - can try something crunchy like Edamame, cashews or cucumbers  Cookies- try fruit  Call me if you put your sensor on on Sunday and want to cancel your appointment.   Butch Penny [phone]

## 2022-11-18 ENCOUNTER — Other Ambulatory Visit (HOSPITAL_COMMUNITY): Payer: Self-pay

## 2022-11-22 ENCOUNTER — Other Ambulatory Visit (HOSPITAL_COMMUNITY): Payer: Self-pay

## 2022-11-22 MED ORDER — SOLIQUA 100-33 UNT-MCG/ML ~~LOC~~ SOPN
15.0000 [IU] | PEN_INJECTOR | Freq: Every day | SUBCUTANEOUS | 11 refills | Status: DC
  Filled 2022-11-22: qty 15, 100d supply, fill #0

## 2022-11-24 DIAGNOSIS — M79641 Pain in right hand: Secondary | ICD-10-CM | POA: Insufficient documentation

## 2022-11-24 DIAGNOSIS — M79642 Pain in left hand: Secondary | ICD-10-CM | POA: Insufficient documentation

## 2022-11-24 NOTE — Progress Notes (Signed)
Established Patient Office Visit  Subjective   Patient ID: Amanda [name], female    DOB: September 01, 1951  Age: 71 y.o. MRN: [id number]  Chief Complaint  Patient presents with   Follow-up    FOLLOW UP DM / PAIN # 6- hands     Amanda [name] is a 71 y.o. person living with a history listed below who presents to clinic for diabetes follow up and bilateral hand pain. Please refer to problem based charting for further details and assessment and plan of current problem and chronic medical conditions.     Patient Active Problem List   Diagnosis Date Noted   Bilateral hand pain 11/24/2022   Financial difficulties 09/02/2022   Pain of left calf 08/19/2022   Diabetic nephropathy with proteinuria ([place]) 05/18/2022   Headache 04/12/2022   Iron deficiency anemia 12/02/2021   Peripheral artery disease ([place]) 10/28/2021   Lump in lower outer quadrant of right breast 10/28/2021   Chronic kidney disease (CKD), stage III (moderate) ([place]) 01/01/2021   Osteopenia 05/04/2016   MDD (major depressive disorder), recurrent episode, moderate ([place]) 02/08/2016   Pseudophakia of both eyes 07/27/2015   Preventive measure 06/03/2013   Diabetic neuropathy ([place]) 05/20/2013   Seasonal allergies 03/12/2013   Dyslipidemia 02/15/2013   DM II (diabetes mellitus, type II), controlled ([place]) 02/14/2013   Essential hypertension 05/10/2010      Review of Systems  Constitutional:  Negative for chills and fever.  Gastrointestinal:  Negative for abdominal pain, diarrhea and vomiting.  Genitourinary:  Negative for frequency and urgency.  Musculoskeletal:  Positive for joint pain.  All other systems reviewed and are negative.     Objective:     BP 116/81 (BP Location: Left Arm, Patient Position: Sitting, Cuff Size: Normal)   Pulse 76   Temp 98 F (36.7 C) (Oral)   Ht 5' 1.5" (1.562 m)   Wt 145 lb 12.8 oz (66.1 kg)   SpO2 100%   BMI 27.10 kg/m  BP Readings from Last 3 Encounters:  11/17/22 116/81  09/15/22  (!) 131/53  09/02/22 138/73      Physical Exam Constitutional:      Appearance: Normal appearance.  HENT:     Mouth/Throat:     Mouth: Mucous membranes are moist.     Pharynx: Oropharynx is clear.  Eyes:     Extraocular Movements: Extraocular movements intact.     Conjunctiva/sclera: Conjunctivae normal.     Pupils: Pupils are equal, round, and reactive to light.  Cardiovascular:     Rate and Rhythm: Normal rate and regular rhythm.     Heart sounds: No murmur heard. Pulmonary:     Effort: Pulmonary effort is normal.     Breath sounds: No rhonchi or rales.  Abdominal:     General: Abdomen is flat. Bowel sounds are normal. There is no distension.     Palpations: Abdomen is soft.     Tenderness: There is no abdominal tenderness.  Musculoskeletal:        General: Normal range of motion.     Right lower leg: No edema.     Left lower leg: No edema.     Comments: Tenderness with palpation of the MCPs and PCP and palms worse on the right side. No swelling of the joints, redness, warmth, no rashes, some stiffness with ROM   Skin:    General: Skin is warm and dry.     Capillary Refill: Capillary refill takes less than 2 seconds.  Neurological:  General: No focal deficit present.     Mental Status: She is alert and oriented to person, place, and time.  Psychiatric:        Mood and Affect: Mood normal.        Behavior: Behavior normal.      Results for orders placed or performed in visit on 11/17/22  Glucose, capillary  Result Value Ref Range   Glucose-Capillary 88 70 - 99 mg/dL  Results for orders placed or performed in visit on 11/17/22  POCT HgB A1C (CPT 83036)  Result Value Ref Range   Hemoglobin A1C 7.7 (A) 4.0 - 5.6 %   HbA1c POC (<> result, manual entry)     HbA1c, POC (prediabetic range)     HbA1c, POC (controlled diabetic range)      Last hemoglobin A1c Lab Results  Component Value Date   HGBA1C 7.7 (A) 11/17/2022      The 10-year ASCVD risk score  (Arnett DK, et al., 2019) is: 17.8%    Assessment & Plan:   Problem List Items Addressed This Visit       Endocrine   DM II (diabetes mellitus, type II), controlled ([place]) - Primary (Chronic)    Patient presents today for diabetes follow up today. Not having lows on her CGM, but did have low around 40 while in office with Amanda [name] due to missing lunch. A1c improved to 7.7%. Notes she has gained about 2 pounds since last visit. Currently on tresiba 11 units daily with Lyumjev 2 units for breakfast and 3 units for lunch and dinner. Also on rybelsus  and  waiting on medication assistance for Jardiance. Discussed changing insulin to GLP and insulin combination instead of rybelsus as this may be more weight neutral for her. Also had discussed the lyumjev Junior kwikpen  for half doses of her insulin, but just refilled her short acting. She will call if she would like to switch in the future.   - stop tresiba and rybelsus - start soliqua 15 units daily  - continue lyumjev with meals - restart jardiance if able to get medication assistance      Relevant Medications   Insulin Glargine-Lixisenatide (SOLIQUA) 100-33 UNT-MCG/ML SOPN     Other   Pain of left calf   Relevant Medications   diclofenac Sodium (VOLTAREN) 1 % GEL   Bilateral hand pain    Pain primarily in MCP and PCPs of the right hand. This is worse after crushing pills for her husband. Prior workup in 2016 for arthralgias and RA negative. No signs of synovitis on exam today. Suspect this is likely due to OA. Will treat symptoms with voltaren gel and have her try other methods to crush medications.        Return in about 4 weeks (around 12/15/2022).    Iona Beard, MD

## 2022-11-24 NOTE — Assessment & Plan Note (Signed)
Pain primarily in MCP and PCPs of the right hand. This is worse after crushing pills for her husband. Prior workup in 2016 for arthralgias and RA negative. No signs of synovitis on exam today. Suspect this is likely due to OA. Will treat symptoms with voltaren gel and have her try other methods to crush medications.

## 2022-11-24 NOTE — Assessment & Plan Note (Signed)
Patient presents today for diabetes follow up today. Not having lows on her CGM, but did have low around 40 while in office with Butch Penny due to missing lunch. A1c improved to 7.7%. Notes she has gained about 2 pounds since last visit. Currently on tresiba 11 units daily with Lyumjev 2 units for breakfast and 3 units for lunch and dinner. Also on rybelsus  and  waiting on medication assistance for Jardiance. Discussed changing insulin to GLP and insulin combination instead of rybelsus as this may be more weight neutral for her. Also had discussed the lyumjev Junior kwikpen  for half doses of her insulin, but just refilled her short acting. She will call if she would like to switch in the future.   - stop tresiba and rybelsus - start soliqua 15 units daily  - continue lyumjev with meals - restart jardiance if able to get medication assistance

## 2022-11-24 NOTE — Progress Notes (Signed)
Internal Medicine Clinic Attending ? ?Case discussed with Dr. Liang  At the time of the visit.  We reviewed the resident?s history and exam and pertinent patient test results.  I agree with the assessment, diagnosis, and plan of care documented in the resident?s note. ? ?

## 2022-11-30 ENCOUNTER — Other Ambulatory Visit (HOSPITAL_COMMUNITY): Payer: Self-pay

## 2022-11-30 ENCOUNTER — Ambulatory Visit (INDEPENDENT_AMBULATORY_CARE_PROVIDER_SITE_OTHER): Payer: Medicare Other | Admitting: Dietician

## 2022-11-30 DIAGNOSIS — E1142 Type 2 diabetes mellitus with diabetic polyneuropathy: Secondary | ICD-10-CM | POA: Diagnosis not present

## 2022-11-30 DIAGNOSIS — Z794 Long term (current) use of insulin: Secondary | ICD-10-CM | POA: Diagnosis not present

## 2022-11-30 NOTE — Patient Instructions (Signed)
Amanda [name], you are doing a great job keeping up your diabetes.   Your follow up is Wednesday December 27th at 24 am.   Happy holidays!  Butch Penny [phone]

## 2022-11-30 NOTE — Progress Notes (Unsigned)
  Medical Nutrition Therapy:  Appt start time: 1696 end time:  7893. Total time: 20 minutes Visit # 6 in 2023  Assessment:  Primary concerns today: glycemic control/hypoglycemia; CGM training, weight management Amanda [name] is getting comfortable using Dexcom G7 CGM. She put a new sensor on at today's visit.  .   She stats that she forgot to take [place] one night, is more of a morning person and would like to start taking it in the morning.  She states she did not miss mealtime insulin but sometimes took it after starting to eat.  MEDICATIONS: Total daily dose: 19 units per day.  11 units Tresiba and ~8 units of Lyumjev.   BLOOD SUGAR: CGM Results from download:  11/17 through 11/30  % Time CGM active:    89 %   (Goal >70%)  Average glucose:   161 mg/dL for 14 days  GMI   7.2 %  Time in range (70-180 mg/dL):   68 %   (Goal >70%)  Time High (181-250 mg/dL):   24 %   (Goal < 25%)  Time Very High (>250 mg/dL):    8 %   (Goal < 5%)  Time Low (54-69 mg/dL):   0 %   (Goal <4%)  Time Very Low (<54 mg/dL):    0 %   (Goal <1%)       Overall her blood glucose is improved. Her weight is gradually rising.  DIETARY INTAKE:not done in detail today  Progress Towards Goal(s):  Some progress.   Nutritional Diagnosis:  NB-1.1 Food and nutrition-related knowledge deficit As related to new CGM and lack of sufficient diabetes self management training.  As evidenced by her report and questions about food intake.    Intervention:  Nutrition education about CGM report, causes, treatments and prevention of hypoglycemia, ideas to help stop weight gain. Action Goal: has met her goal to take some insulin with each meal; now working on not gaining weight by making changes to her meal plan  Outcome goal: less hypoglcyemia and hyperglycemia Coordination of care: none    Teaching Method Utilized: Visual, Auditory,Hands on Handouts given during visit include: After visit summary Barriers to learning/adherence  to lifestyle change: caring for very ill spouse  Demonstrated degree of understanding via:  Teach Back   Monitoring/Evaluation:  Dietary intake, exercise, CGM, and body weight  10 daysDonna Kyarah [name], RD 11/30/2022 4:41 PM. .

## 2022-12-06 ENCOUNTER — Other Ambulatory Visit (HOSPITAL_COMMUNITY): Payer: Self-pay

## 2022-12-07 ENCOUNTER — Ambulatory Visit: Payer: Medicare Other | Admitting: Podiatry

## 2022-12-14 ENCOUNTER — Ambulatory Visit: Payer: Medicare Other | Admitting: Dietician

## 2022-12-14 NOTE — Progress Notes (Signed)
Assisted patient with CGM sensor change. She has an appointment tomorrow and CGM receiver wiil be downloaded. [name], RD 12/14/2022 3:28 PM.

## 2022-12-14 NOTE — Patient Instructions (Signed)
You are doing great at placing your own sensor.   Butch Penny [phone]

## 2022-12-15 ENCOUNTER — Encounter: Payer: Self-pay | Admitting: Student

## 2022-12-15 ENCOUNTER — Other Ambulatory Visit (HOSPITAL_COMMUNITY): Payer: Self-pay

## 2022-12-15 ENCOUNTER — Ambulatory Visit (INDEPENDENT_AMBULATORY_CARE_PROVIDER_SITE_OTHER): Payer: Medicare Other | Admitting: Student

## 2022-12-15 DIAGNOSIS — Z794 Long term (current) use of insulin: Secondary | ICD-10-CM

## 2022-12-15 DIAGNOSIS — E1142 Type 2 diabetes mellitus with diabetic polyneuropathy: Secondary | ICD-10-CM | POA: Diagnosis not present

## 2022-12-15 DIAGNOSIS — I10 Essential (primary) hypertension: Secondary | ICD-10-CM

## 2022-12-15 MED ORDER — EMPAGLIFLOZIN 10 MG PO TABS
10.0000 mg | ORAL_TABLET | Freq: Every day | ORAL | 2 refills | Status: DC
  Filled 2022-12-15 (×2): qty 30, 30d supply, fill #0

## 2022-12-15 MED ORDER — CHLORTHALIDONE 25 MG PO TABS
25.0000 mg | ORAL_TABLET | Freq: Every day | ORAL | 3 refills | Status: DC
  Filled 2022-12-15 – 2023-02-22 (×2): qty 90, 90d supply, fill #0
  Filled 2023-06-09 – 2023-07-03 (×2): qty 90, 90d supply, fill #1
  Filled 2023-10-18: qty 90, 90d supply, fill #2

## 2022-12-15 MED ORDER — RYBELSUS 7 MG PO TABS
7.0000 mg | ORAL_TABLET | Freq: Every day | ORAL | 5 refills | Status: DC
  Filled 2022-12-15 (×2): qty 30, 30d supply, fill #0
  Filled 2023-02-22: qty 90, 90d supply, fill #0

## 2022-12-15 MED ORDER — OLMESARTAN MEDOXOMIL 40 MG PO TABS
40.0000 mg | ORAL_TABLET | Freq: Every day | ORAL | 2 refills | Status: DC
  Filled 2022-12-15: qty 90, 90d supply, fill #0

## 2022-12-15 NOTE — Assessment & Plan Note (Addendum)
Diabetes is reasonably controlled.  Last A1c 7.7.  During last visit, she was started on [place] in place of Tresiba and Rybelsus.  Unfortunately she could not afford Soliqua due to cost.  She currently taking 11 units of Tresiba and sliding scale lispro.  She is not taking Jardiance and Rybelsus.   Patient was given Jardiance samples in September but never picked up this medication.  She is also not sure why she is not Rybelsus.  She is taking metformin 500 mg daily.  She was on Trulicity in the past but discontinued due to cost.  Her Dexcom report showed good sugar control with average CBG 165.  68% of her CBG is within goal with no lows.  -Continue Tresiba 11 units daily with lispro sliding scale -Add Jardiance 10 mg daily for diabetes and CKD benefits  -Resume Rybelsus -Continue DEXA,

## 2022-12-15 NOTE — Assessment & Plan Note (Addendum)
Initial blood pressure significantly elevated 195/85.  Repeat 178/84.  Patient reportedly took her medication the morning of the visit.  She reports adherence to the olmesartan and chlorthalidone.  Patient admits to eating salty chips the night before.  Her blood pressure was well-controlled prior to this visit.  Her high salt intake could be a trigger.  Patient denies any symptoms including chest pain, shortness of breath, headache, blurry vision.  -Increase chlorthalidone from 12.5 mg to 25 mg daily -Continue olmesartan 40 mg daily -Follow-up in 2 weeks for blood pressure recheck and BMP

## 2022-12-15 NOTE — Patient Instructions (Addendum)
Ms. Profit,  It was nice seeing you in the clinic today.  Here is a very well we talked about:  1.  Type 2 diabetes: Your Dexcom showed well blood sugar control.  Please continue Tresiba 11 units daily and lispro sliding scale.  I prescribed Jardiance 10 mg daily to help with your blood sugar.  Please also resume Rybelsus.  2.  Your blood pressure is high today.  One of the main causes of hyper pressure is soft intake.  Please cut back on salty food.  I will increase your chlorthalidone from half a tablet to 1 tablet a day.  Please continue olmesartan.  Please return in 2 weeks for blood pressure recheck and repeat blood work.  Take care,  Dr. Alfonse Spruce

## 2022-12-15 NOTE — Progress Notes (Signed)
Internal Medicine Clinic Attending  Case discussed with the resident at the time of the visit.  We reviewed the resident's history and exam and pertinent patient test results.  I agree with the assessment, diagnosis, and plan of care documented in the resident's note.  

## 2022-12-15 NOTE — Progress Notes (Signed)
CC: 2 weeks follow-up on diabetes  HPI:  [name] is a 71 y.o. living with hypertension, type 2 diabetes, PAD, who presents to the clinic for 2 weeks follow-up on her type 2 diabetes.  Please see problem based charting for detail  Past Medical History:  Diagnosis Date   Allergy    SEASONAL   Arthritis    Cervical radiculopathy due to degenerative joint disease of spine 05/03/2017   CKD (chronic kidney disease) stage 3, GFR 30-59 ml/min ([place]) 01/01/2021   Diabetes mellitus 1996   Glaucoma    H. pylori infection    Hyperlipidemia    Hypertension    Normocytic anemia 02/14/2013   Osteopenia 05/04/2016   Pain of both shoulder joints 11/14/2016   Review of Systems:  per HPI  Physical Exam:  Vitals:   12/15/22 1016 12/15/22 1036 12/15/22 1100  BP: (!) 195/85 (!) 195/88 (!) 178/84  Pulse: 76 78 73  Temp: 97.8 F (36.6 C)    TempSrc: Oral    SpO2: 100%    Weight: 145 lb 4.8 oz (65.9 kg)    Height: 5' 1.5" (1.562 m)     Physical Exam Constitutional:      General: She is not in acute distress.    Appearance: She is not ill-appearing.  HENT:     Head: Normocephalic.  Eyes:     General:        Right eye: No discharge.        Left eye: No discharge.     Conjunctiva/sclera: Conjunctivae normal.  Pulmonary:     Effort: Pulmonary effort is normal. No respiratory distress.  Musculoskeletal:        General: Normal range of motion.     Cervical back: Normal range of motion.  Neurological:     General: No focal deficit present.     Mental Status: She is alert.  Psychiatric:        Mood and Affect: Mood normal.      Assessment & Plan:   See Encounters Tab for problem based charting.  Essential hypertension Initial blood pressure significantly elevated 195/85.  Repeat 178/84.  Patient reportedly took her medication the morning of the visit.  She reports adherence to the olmesartan and chlorthalidone.  Patient admits to eating salty chips the night  before.  Her blood pressure was well-controlled prior to this visit.  Her high salt intake could be a trigger.  Patient denies any symptoms including chest pain, shortness of breath, headache, blurry vision.  -Increase chlorthalidone from 12.5 mg to 25 mg daily -Continue olmesartan 40 mg daily -Follow-up in 2 weeks for blood pressure recheck and BMP  DM II (diabetes mellitus, type II), controlled ([place]) Diabetes is reasonably controlled.  Last A1c 7.7.  During last visit, she was started on [place] in place of Tresiba and Rybelsus.  Unfortunately she could not afford Soliqua due to cost.  She currently taking 11 units of Tresiba and sliding scale lispro.  She is not taking Jardiance and Rybelsus.   Patient was given Jardiance samples in September but never picked up this medication.  She is also not sure why she is not Rybelsus.  She is taking metformin 500 mg daily.  She was on Trulicity in the past but discontinued due to cost.  Her Dexcom report showed good sugar control with average CBG 165.  68% of her CBG is within goal with no lows.  -Continue Tresiba 11 units daily with lispro sliding scale -  Add Jardiance 10 mg daily for diabetes and CKD benefits  -Resume Rybelsus -Continue DEXA,   Patient discussed with Dr. Heber [place]

## 2022-12-16 ENCOUNTER — Other Ambulatory Visit (HOSPITAL_COMMUNITY): Payer: Self-pay

## 2022-12-21 ENCOUNTER — Ambulatory Visit: Payer: Medicare Other | Admitting: Podiatry

## 2022-12-21 ENCOUNTER — Telehealth: Payer: Self-pay | Admitting: Dietician

## 2022-12-21 NOTE — Telephone Encounter (Signed)
Moved her appointment from Monday 1/8 to Thursday 12/29/22 per her request. Patient notified.

## 2022-12-26 ENCOUNTER — Ambulatory Visit: Payer: Self-pay | Admitting: Dietician

## 2022-12-27 ENCOUNTER — Other Ambulatory Visit (HOSPITAL_COMMUNITY): Payer: Self-pay

## 2022-12-27 ENCOUNTER — Ambulatory Visit (INDEPENDENT_AMBULATORY_CARE_PROVIDER_SITE_OTHER): Payer: Medicare Other | Admitting: Podiatry

## 2022-12-27 VITALS — BP 130/68

## 2022-12-27 DIAGNOSIS — M79675 Pain in left toe(s): Secondary | ICD-10-CM | POA: Diagnosis not present

## 2022-12-27 DIAGNOSIS — B351 Tinea unguium: Secondary | ICD-10-CM | POA: Diagnosis not present

## 2022-12-27 DIAGNOSIS — E1142 Type 2 diabetes mellitus with diabetic polyneuropathy: Secondary | ICD-10-CM | POA: Diagnosis not present

## 2022-12-27 DIAGNOSIS — M79674 Pain in right toe(s): Secondary | ICD-10-CM | POA: Diagnosis not present

## 2022-12-27 MED ORDER — CICLOPIROX 8 % EX SOLN
Freq: Every day | CUTANEOUS | 0 refills | Status: DC
  Filled 2022-12-27: qty 6.6, 30d supply, fill #0

## 2022-12-27 NOTE — Progress Notes (Signed)
  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 Pain is relieved with periodic professional debridement.  New problem(s): None.   PCP is [name], Joellen Jersey, DO , and last visit was  August 19, 2022.  Allergies  Allergen Reactions   Gabapentin Other (See Comments)    Pt states medication made her feel "out of it".   Pt states medication made her feel "out of it".      Review of Systems: Negative except as noted in the HPI.  Objective: No changes noted in today's physical examination. Amanda [name] is a pleasant 72 y.o. female in NAD. AAO x 3.  Neurovascular Examination: Capillary refill time to digits immediate b/l. Palpable pedal pulses b/l LE. Pedal hair present. No pain with calf compression b/l. Lower extremity skin temperature gradient within normal limits. No edema noted b/l LE. No cyanosis or clubbing noted b/l LE.  Pt has subjective symptoms of neuropathy. Protective sensation intact 5/5 intact bilaterally with 10g monofilament b/l. Vibratory sensation intact b/l.  Dermatological:  Pedal integument with normal turgor, texture and tone BLE. No open wounds b/l LE. No interdigital macerations noted b/l LE. Toenail(s) 1-5 bilaterally elongated, discolored, dystrophic, thickened. Nails are crumbly with subungual debris and there is exquisite tenderness to dorsal palpation. No subungual wound(s) noted. No hyperkeratotic nor porokeratotic lesions present on today's visit.  Musculoskeletal:  Muscle strength 5/5 to all lower extremity muscle groups bilaterally. HAV with bunion deformity noted b/l LE. Hammertoe(s) noted to the R 5th toe.  Assessment/Plan: No diagnosis found. -Patient was evaluated and treated. All patient's and/or POA's  questions/concerns answered on today's visit. -Continue foot and shoe inspections daily. Monitor blood glucose per PCP/Endocrinologist's recommendations. -Toenails 1-5 b/l were debrided in length and girth with sterile nail nippers and dremel without iatrogenic bleeding.  -Patient/POA to call should there be question/concern in the interim.   No follow-ups on file.  Amanda [name], DPM

## 2022-12-28 ENCOUNTER — Ambulatory Visit: Payer: Medicare Other

## 2022-12-29 ENCOUNTER — Other Ambulatory Visit: Payer: Self-pay

## 2022-12-29 ENCOUNTER — Other Ambulatory Visit (HOSPITAL_COMMUNITY): Payer: Self-pay

## 2022-12-29 ENCOUNTER — Encounter: Payer: Self-pay | Admitting: Student

## 2022-12-29 ENCOUNTER — Other Ambulatory Visit: Payer: Self-pay | Admitting: Dietician

## 2022-12-29 ENCOUNTER — Ambulatory Visit (INDEPENDENT_AMBULATORY_CARE_PROVIDER_SITE_OTHER): Payer: Medicare Other | Admitting: Dietician

## 2022-12-29 ENCOUNTER — Ambulatory Visit (INDEPENDENT_AMBULATORY_CARE_PROVIDER_SITE_OTHER): Payer: Medicare Other | Admitting: Student

## 2022-12-29 ENCOUNTER — Ambulatory Visit: Payer: Self-pay | Admitting: Dietician

## 2022-12-29 ENCOUNTER — Telehealth: Payer: Self-pay

## 2022-12-29 VITALS — BP 158/76 | HR 75 | Temp 97.9°F | Ht 61.0 in | Wt 146.2 lb

## 2022-12-29 DIAGNOSIS — E118 Type 2 diabetes mellitus with unspecified complications: Secondary | ICD-10-CM | POA: Diagnosis not present

## 2022-12-29 DIAGNOSIS — I10 Essential (primary) hypertension: Secondary | ICD-10-CM

## 2022-12-29 DIAGNOSIS — R0602 Shortness of breath: Secondary | ICD-10-CM | POA: Insufficient documentation

## 2022-12-29 DIAGNOSIS — E875 Hyperkalemia: Secondary | ICD-10-CM | POA: Diagnosis not present

## 2022-12-29 DIAGNOSIS — Z7984 Long term (current) use of oral hypoglycemic drugs: Secondary | ICD-10-CM

## 2022-12-29 MED ORDER — DAPAGLIFLOZIN PROPANEDIOL 5 MG PO TABS
5.0000 mg | ORAL_TABLET | Freq: Every day | ORAL | 11 refills | Status: DC
  Filled 2022-12-29: qty 30, 30d supply, fill #0

## 2022-12-29 MED ORDER — AMLODIPINE BESYLATE 10 MG PO TABS
10.0000 mg | ORAL_TABLET | Freq: Every day | ORAL | 11 refills | Status: DC
  Filled 2022-12-29: qty 30, 30d supply, fill #0

## 2022-12-29 MED ORDER — AMLODIPINE BESYLATE 5 MG PO TABS
5.0000 mg | ORAL_TABLET | Freq: Every day | ORAL | 11 refills | Status: DC
  Filled 2022-12-29: qty 30, 30d supply, fill #0

## 2022-12-29 NOTE — Assessment & Plan Note (Signed)
Adherent to insulin and Rybelsus but Amanda [name] is not affordable. She wonders if we have any samples to offer today. Medication Samples have been provided to the patient.  Drug name: Jardiance       Strength: 10 mg        Qty: 14 Dosing instructions: 1 tablet daily  The patient has been instructed regarding the correct time, dose, and frequency of taking this medication, including desired effects and most common side effects.  I also ordered Farxiga 5 mg daily and instructed patient to see what the cost of this medication is from her pharmacy. If this medicine is affordable, she is to finish the Jardiance samples prior to starting the [place].

## 2022-12-29 NOTE — Telephone Encounter (Signed)
Patient called regarding assistance with medications/samples you were helping her with please return patients call [phone]

## 2022-12-29 NOTE — Progress Notes (Signed)
Diabetes Self-Management Education  Visit Type:     Appt. Start Time: 1015 Appt. End Time: 6283  12/29/2022  [name], identified by name and date of birth, is a 72 y.o. female with a diagnosis of Diabetes:  .   ASSESSMENT  Assisted patient with removing and applyig a new dexcom G7 sensor.   Blood sugars are higher this past month. Patient mentions the holidays were difficult for her.  CGM Results from download:   % Time CGM active:   75.6 %   (Goal >70%)  Average glucose:   193 mg/dL for 14 days  Glucose management indicator:   7.9 %  Time in range (70-180 mg/dL):   51 %   (Goal >70%)  Time High (181-250 mg/dL):   33 %   (Goal < 25%)  Time Very High (>250 mg/dL):    16 %   (Goal < 5%)  Time Low (54-69 mg/dL):   0 %   (Goal <4%)  Time Very Low (<54 mg/dL):   0 %   (Goal <1%)  Coefficient of variation:   32.4 %   (Goal <36%)        Learning Objective:  Patient will have a greater understanding of diabetes self-management. Patient education plan is to attend individual and/or group sessions per assessed needs and concerns.   Plan:   There are no Patient Instructions on file for this visit.   Expected Outcomes:     Education material provided: Diabetes Resources  If problems or questions, patient to contact team via:  Phone  Future DSME appointment: -

## 2022-12-29 NOTE — Patient Instructions (Addendum)
Today we discussed high blood pressure and diabetes.  I have given you some samples of Jardiance. If you're able to afford the [place] prescription, finish the [place] before starting the [place].  For your blood pressure, I started amlodipine 5 mg daily.  Return in 2 weeks for follow up of high blood pressure, for and titration of antihypertensive medications.   I will call you with the results of the following laboratory test(s):  Lab Orders         BMP8+Anion Gap         Lipid Profile       Expect a call from the following department(s):  Referral Orders         Ambulatory referral to Cardiology      Please call our clinic at [phone] Monday through Friday from 9 am to 4 pm if you have questions or concerns about your health. If after hours or on the weekend, call the main hospital number and ask for the Internal Medicine Resident On-Call. If you need medication refills, please notify your pharmacy one week in advance and they will send [place] a request.   Best, Nani Gasser, [place]

## 2022-12-29 NOTE — Assessment & Plan Note (Addendum)
Ongoing for months now. No chest pain or pressure, but her story, risk factors, and personal history of PAD make me worry about angina. This sounds like stable angina. No indication for urgent referral but recommend referral to cardiology for stress testing.

## 2022-12-29 NOTE — Assessment & Plan Note (Addendum)
Improved since last visit but remains poorly controlled at 158/76 today. She reports intermittent adherence with chlorthalidone as she feels this medicine "dries her skin out." She takes it on average once every other day. She has been on amlodipine in the past but this was discontinued due to diarrhea. This appears to be an uncommon side-effect of this medication so I will restart amlodipine today at 5 mg daily, impression is that diarrhea may have been a coincidence last time.

## 2022-12-29 NOTE — Progress Notes (Addendum)
Subjective:  [name] is a 72 y.o. who presents to clinic for follow up of hypertension and diabetes.  Doing okay with medication changes.  Does acknowledge that she occasionally skips her chlorthalidone because she feels that it "dries her out."    Review of systems is positive for exertional dyspnea that is been ongoing for a while now.  She does not have dyspnea at rest.  Her dyspnea is worse when she is caring for her ailing husband, for example doing things like turns and cleaning.  She also reports that she has dyspnea when walking up stairs.  Sometimes she has to stop halfway up a flight of stairs because she gets winded.  She denies chest pain.  Patient Active Problem List   Diagnosis Date Noted   Exertional shortness of breath 12/29/2022   Bilateral hand pain 11/24/2022   Financial difficulties 09/02/2022   Pain of left calf 08/19/2022   Diabetic nephropathy with proteinuria ([place]) 05/18/2022   Headache 04/12/2022   Iron deficiency anemia 12/02/2021   Peripheral artery disease ([place]) 10/28/2021   Lump in lower outer quadrant of right breast 10/28/2021   Chronic kidney disease (CKD), stage III (moderate) ([place]) 01/01/2021   Hyperkalemia 12/24/2019   Osteopenia 05/04/2016   MDD (major depressive disorder), recurrent episode, moderate ([place]) 02/08/2016   Pseudophakia of both eyes 07/27/2015   Preventive measure 06/03/2013   Diabetic neuropathy ([place]) 05/20/2013   Seasonal allergies 03/12/2013   Dyslipidemia 02/15/2013   Type II diabetes mellitus with complication ([place]) 37/85/8850   Essential hypertension 05/10/2010    Family history: Reviewed in medical record.  Social history: Reviewed in medical record. Pertinent updates include difficulty obtaining diabetes medicines, specifically SGLT2 inhibitors due to cost.  Objective:   Vitals:   12/29/22 0938 12/29/22 0941  BP: (!) 166/73 (!) 158/76  Pulse: 77 75  Temp: 97.9 F (36.6 C)   TempSrc: Oral   SpO2:  100%   Weight: 146 lb 3.2 oz (66.3 kg)   Height: '5\' 1"'$  (1.549 m)     Physical Exam Vitals reviewed.  Constitutional:      General: She is not in acute distress.    Appearance: Normal appearance.  Cardiovascular:     Rate and Rhythm: Normal rate and regular rhythm.     Pulses: Normal pulses.     Heart sounds: No murmur heard. Pulmonary:     Effort: Pulmonary effort is normal.     Breath sounds: Normal breath sounds. No stridor.  Musculoskeletal:     Right lower leg: No edema.     Left lower leg: No edema.  Skin:    General: Skin is warm and dry.  Neurological:     Mental Status: She is alert. Mental status is at baseline.  Psychiatric:        Mood and Affect: Mood normal.        Behavior: Behavior normal.     Assessment & Plan:  The primary encounter diagnosis was Type II diabetes mellitus with complication ([place]). Diagnoses of Essential hypertension, Exertional shortness of breath, and Hyperkalemia were also pertinent to this visit.  Essential hypertension Improved since last visit but remains poorly controlled at 158/76 today. She reports intermittent adherence with chlorthalidone as she feels this medicine "dries her skin out." She takes it on average once every other day. She has been on amlodipine in the past but this was discontinued due to diarrhea. This appears to be an uncommon side-effect of this medication  so I will restart amlodipine today at 5 mg daily, impression is that diarrhea may have been a coincidence last time.  Exertional shortness of breath Ongoing for months now. No chest pain or pressure, but her story, risk factors, and personal history of PAD make me worry about angina. This sounds like stable angina. No indication for urgent referral but recommend referral to cardiology for stress testing.  Type II diabetes mellitus with complication (HCC) Adherent to insulin and Rybelsus but Jardiance is not affordable. She wonders if we have any samples to offer  today. Medication Samples have been provided to the patient.  Drug name: Jardiance       Strength: 10 mg        Qty: 14 Dosing instructions: 1 tablet daily  The patient has been instructed regarding the correct time, dose, and frequency of taking this medication, including desired effects and most common side effects.  I also ordered Farxiga 5 mg daily and instructed patient to see what the cost of this medication is from her pharmacy. If this medicine is affordable, she is to finish the Jardiance samples prior to starting the [place].   Hyperkalemia Addendum 12/30/2022 Potassium on BMP of 5.7.  In setting of decreased chlorthalidone use.  Recommend decreasing olmesartan to 20 mg daily and following up in a week's time for BMP.  At follow-up, discuss restarting chlorthalidone on a daily basis versus continuing olmesartan at a decreased dose.  Need to balance risks of hyperkalemia from antihypertensive therapy with benefits of better blood pressure control in this patient with risk factors for ASCVD.    Return in 2 weeks for follow up of high blood pressure, for and titration of antihypertensive medications.  Patient discussed with Dr. Loree Fee MD 12/30/2022, 11:32 AM  Pager: [phone]

## 2022-12-30 LAB — BMP8+ANION GAP
Anion Gap: 13 mmol/L (ref 10.0–18.0)
BUN/Creatinine Ratio: 21 (ref 12–28)
BUN: 30 mg/dL — ABNORMAL HIGH (ref 8–27)
CO2: 20 mmol/L (ref 20–29)
Calcium: 8.9 mg/dL (ref 8.7–10.3)
Chloride: 106 mmol/L (ref 96–106)
Creatinine, Ser: 1.45 mg/dL — ABNORMAL HIGH (ref 0.57–1.00)
Glucose: 108 mg/dL — ABNORMAL HIGH (ref 70–99)
Potassium: 5.7 mmol/L — ABNORMAL HIGH (ref 3.5–5.2)
Sodium: 139 mmol/L (ref 134–144)
eGFR: 39 mL/min/{1.73_m2} — ABNORMAL LOW (ref >59)

## 2022-12-30 LAB — LIPID PANEL
Chol/HDL Ratio: 1.9 ratio (ref 0.0–4.4)
Cholesterol, Total: 156 mg/dL (ref 100–199)
HDL: 83 mg/dL (ref >39)
LDL Chol Calc (NIH): 64 mg/dL (ref 0–99)
Triglycerides: 35 mg/dL (ref 0–149)
VLDL Cholesterol Cal: 9 mg/dL (ref 5–40)

## 2022-12-30 MED ORDER — OLMESARTAN MEDOXOMIL 40 MG PO TABS: 20.0000 mg | ORAL_TABLET | Freq: Every day | ORAL | 2 refills | Status: DC

## 2022-12-30 NOTE — Assessment & Plan Note (Addendum)
Addendum 12/30/2022 Potassium on BMP of 5.7.  In setting of decreased chlorthalidone use.  Recommend decreasing olmesartan to 20 mg daily and following up in a week's time for BMP.  At follow-up, discuss restarting chlorthalidone on a daily basis versus continuing olmesartan at a decreased dose.  Need to balance risks of hyperkalemia from antihypertensive therapy with benefits of better blood pressure control in this patient with risk factors for ASCVD.

## 2022-12-30 NOTE — Progress Notes (Signed)
Notable for improved eGFR and mild hyperkalemia.  Recommend decreasing olmesartan to 20 mg daily and following up next week for a repeat BMP. Called and discussed recommendation. Patient is amenable to this plan.

## 2022-12-30 NOTE — Addendum Note (Signed)
Addended by: Nani Gasser on: 12/30/2022 11:32 AM   Modules accepted: Orders

## 2022-12-30 NOTE — Progress Notes (Signed)
Internal Medicine Clinic Attending  Case discussed with Dr. Carter  At the time of the visit.  We reviewed the resident's history and exam and pertinent patient test results.  I agree with the assessment, diagnosis, and plan of care documented in the resident's note.  

## 2023-01-03 ENCOUNTER — Encounter: Payer: Self-pay | Admitting: Dietician

## 2023-01-05 NOTE — Telephone Encounter (Signed)
Faxed completed income section 11/22/22.  Followed up 01/05/23 - never rec'd  Refaxing page.  Will provide patient samples.

## 2023-01-05 NOTE — Telephone Encounter (Signed)
Left patient message with info.

## 2023-01-06 ENCOUNTER — Ambulatory Visit (INDEPENDENT_AMBULATORY_CARE_PROVIDER_SITE_OTHER): Payer: Medicare Other | Admitting: Student

## 2023-01-06 ENCOUNTER — Other Ambulatory Visit (HOSPITAL_COMMUNITY): Payer: Self-pay

## 2023-01-06 VITALS — BP 113/59 | HR 78 | Temp 97.6°F | Ht 61.0 in | Wt 143.5 lb

## 2023-01-06 DIAGNOSIS — Z9641 Presence of insulin pump (external) (internal): Secondary | ICD-10-CM

## 2023-01-06 DIAGNOSIS — I10 Essential (primary) hypertension: Secondary | ICD-10-CM | POA: Diagnosis not present

## 2023-01-06 DIAGNOSIS — E875 Hyperkalemia: Secondary | ICD-10-CM

## 2023-01-06 DIAGNOSIS — E118 Type 2 diabetes mellitus with unspecified complications: Secondary | ICD-10-CM | POA: Diagnosis not present

## 2023-01-06 DIAGNOSIS — Z794 Long term (current) use of insulin: Secondary | ICD-10-CM

## 2023-01-06 DIAGNOSIS — Z7984 Long term (current) use of oral hypoglycemic drugs: Secondary | ICD-10-CM

## 2023-01-06 MED ORDER — OLMESARTAN MEDOXOMIL 20 MG PO TABS
20.0000 mg | ORAL_TABLET | Freq: Every day | ORAL | 11 refills | Status: DC
  Filled 2023-01-06: qty 30, 30d supply, fill #0
  Filled 2023-02-17 (×2): qty 30, 30d supply, fill #1
  Filled 2023-03-15: qty 30, 30d supply, fill #2
  Filled 2023-04-21: qty 30, 30d supply, fill #3
  Filled 2023-06-01: qty 30, 30d supply, fill #4
  Filled 2023-07-26: qty 30, 30d supply, fill #5
  Filled 2023-08-29: qty 30, 30d supply, fill #6
  Filled 2023-10-04: qty 30, 30d supply, fill #7
  Filled 2023-11-10: qty 30, 30d supply, fill #8
  Filled 2023-12-21: qty 30, 30d supply, fill #9

## 2023-01-06 NOTE — Assessment & Plan Note (Signed)
Patient presented for hypertension follow up. Last OV 12/29/22. At that time BP 160 ->158/76. Patient was restarted on amlodipine 5 mg, decreased olmesartan 20 mg, and increased Chlorthalidone 25 mg daily. She mentions her  blood pressures have been much lower at home than they had been, with episodes of lightlessness when walking around without chest pain or DOE. She has been compliant with medications. She also notes that though her stools are not as loose as when she had been on amlodipine before, she has started having looser stools since restarting this medication.  Patient denies palpitations, chest pain, muscle weakness or spasms.   Vitals:   01/06/23 1011  BP: (!) 113/59    Patient BP is much lower than prior. Suspect lightheadedness may be related to hypotensive episodes. Given this and the risk associated with hypotensive episodes, will de-escalate antihypertensive regimen. Though rare, Gi side effects can happen with amlodipine, so I will stop this medication today. Will recheck BMP today to assess K and BMP. Will continue 20 mg olmesartan and current dose of chlorthalidone with strict instructions to reduce chlorthalidone dose if lightheadedness episodes continue to occur. -BMP today -Continue olmesartan 20 mg  -Continue chlorthalidone 25 mg -BMP today to recheck K  -Monitor BP daily

## 2023-01-06 NOTE — Assessment & Plan Note (Signed)
Patient denies palpitations, chest pain, muscle weakness or spasms. -BMP today

## 2023-01-06 NOTE — Patient Instructions (Addendum)
Thank you, Ms.Ajai R Hollywood for allowing [place] to provide your care today. Today we discussed your blood pressure  Stop taking amlodipine  2.  Continue taking olmesartan 20 mg 3. Continue taking the chlorthalidone 25 mg   Watch out for episodes of dizziness or tunnel vision that may tell you that your blood pressure is low. Take your blood pressure then. If you continue to have episodes where you feel lightheaded, call us back, make an appoitment, and change how you take the Chlorthalidone: take 12.5 mg (half a pill) instead of 25 mg (full pill)  But also, I want you keep checking your blood pressure daily. And write the numbers:  Blood pressure tips   It is best to check your BP 1-2 hours after taking your medications to see the medications effectiveness on your BP.    Here are some tips that our clinical pharmacists share for home BP monitoring:          Rest 10 minutes before taking your blood pressure.          Don't smoke or drink caffeinated beverages for at least 30 minutes before.          Take your blood pressure before (not after) you eat.          Sit comfortably with your back supported and both feet on the floor (don't cross your legs).          Elevate your arm to heart level on a table or a desk.          Use the proper sized cuff. It should fit smoothly and snugly around your bare upper arm. There should be enough room to slip a fingertip under the cuff. The bottom edge of the cuff should be 1 inch above the crease of the elbow.     I have ordered the following labs for you:  Lab Orders         BMP8+Anion Gap       I will call if any are abnormal. All of your labs can be accessed through "My Chart".   Please make sure to arrive 15 minutes prior to your next appointment. If you arrive late, you may be asked to reschedule.    We look forward to seeing you next time. Please call our clinic at [phone] if you have any questions or concerns. The best time to  call is Monday-Friday from 9am-4pm, but there is someone available 24/7. If after hours or the weekend, call the main hospital number and ask for the Internal Medicine Resident On-Call. If you need medication refills, please notify your pharmacy one week in advance and they will send [place] a request.   Thank you for letting us take part in your care. Wishing you the best!  Romana Juniper, MD 01/06/2023, 10:47 AM [facility] Internal [facility] Resident, PGY-1

## 2023-01-06 NOTE — Progress Notes (Signed)
Subjective:  CC: blood pressure follow up  HPI:  [name] is a 72 y.o. female with a past medical history stated below and presents today for blood pressure and potassium follow up. Please see problem based assessment and plan for additional details.  Past Medical History:  Diagnosis Date   Allergy    SEASONAL   Arthritis    Cervical radiculopathy due to degenerative joint disease of spine 05/03/2017   CKD (chronic kidney disease) stage 3, GFR 30-59 ml/min ([place]) 01/01/2021   Diabetes mellitus 1996   Glaucoma    H. pylori infection    Hyperlipidemia    Hypertension    Normocytic anemia 02/14/2013   Osteopenia 05/04/2016   Pain of both shoulder joints 11/14/2016    Current Outpatient Medications on File Prior to Visit  Medication Sig Dispense Refill   acetaminophen (TYLENOL) 325 MG tablet Take 2 tablets (650 mg total) by mouth every 6 (six) hours as needed (or Fever >/= 101). 60 tablet 0   amLODipine (NORVASC) 5 MG tablet Take 1 tablet (5 mg total) by mouth daily. 30 tablet 11   atorvastatin (LIPITOR) 40 MG tablet Take 1 tablet (40 mg total) by mouth daily. 90 tablet 2   blood glucose meter kit and supplies KIT Dispense based on patient and insurance preference. Use up to four times daily as directed. . 1 each 0   chlorthalidone (HYGROTON) 25 MG tablet Take 1 tablet (25 mg total) by mouth daily. 90 tablet 3   ciclopirox (PENLAC) 8 % solution Apply topically at bedtime. Apply over nail and surrounding skin. Apply daily over previous coat. After seven (7) days, may remove with alcohol and continue cycle. 6.6 mL 0   Continuous Blood Gluc Receiver (DEXCOM G7 RECEIVER) DEVI Use to check blood glucose before upon waking up, meals, at bedtime, and as needed, 1 each 0   Continuous Blood Gluc Sensor (DEXCOM G7 SENSOR) MISC Apply to back of arm as directed. Change every 10 days. 9 each 2   dapagliflozin propanediol (FARXIGA) 5 MG TABS tablet Take 1 tablet (5 mg total) by mouth  daily before breakfast. 30 tablet 11   diclofenac Sodium (VOLTAREN) 1 % GEL Apply 2 g topically daily as needed (hand pain). 100 g 2   fluticasone (FLONASE) 50 MCG/ACT nasal spray Place 1 spray into both nostrils daily. 16 g 2   glucose blood test strip You do not medically need to check blood sugar while not taking insulin. You can check your blood sugar if you are concerned when you want, but do not need to 100 each 12   insulin degludec (TRESIBA FLEXTOUCH) 100 UNIT/ML FlexTouch Pen Inject 11 Units into the skin daily. 3 mL 12   Insulin Lispro-aabc (LYUMJEV KWIKPEN) 100 UNIT/ML KwikPen Inject 2 Units into the skin daily with breakfast AND 4 Units daily with lunch AND 4 Units daily with supper. 3 mL 12   Insulin Pen Needle (PEN NEEDLES) 32G X 4 MM MISC Use as directed. 100 each 10   latanoprost (XALATAN) 0.005 % ophthalmic solution Place 1 drop into both eyes at bedtime.     metFORMIN (GLUCOPHAGE-XR) 500 MG 24 hr tablet Take 1 tablet (500 mg total) by mouth daily with breakfast. 90 tablet 3   Multiple Vitamins-Minerals (WOMENS 50+ MULTI VITAMIN/MIN) TABS Take 1 tablet by mouth daily. 30 tablet    Semaglutide (RYBELSUS) 7 MG TABS Take 1 tablet (7 mg) by mouth daily. 90 tablet 5   [DISCONTINUED]  glipiZIDE (GLUCOTROL) 5 MG tablet Take 0.5 tablets (2.5 mg total) by mouth daily before breakfast. 15 tablet 0   No current facility-administered medications on file prior to visit.    Family History  Problem Relation Age of Onset   Diabetes Mother    Hypertension Mother    Breast cancer Mother    Cancer Mother    Diabetes Sister    Hypertension Sister    Kidney disease Sister        One Kidney transplant, one sister on HD   Breast cancer Sister    Cancer Sister    Hypertension Sister    Diabetes Sister    Cerebral palsy Brother    Cancer Brother        Unknown   Hypertension Daughter    Colon cancer Neg Hx    Esophageal cancer Neg Hx    Stomach cancer Neg Hx    Rectal cancer Neg Hx      Social History   Socioeconomic History   Marital status: Married    Spouse name: Not on file   Number of children: 1   Years of education: Not on file   Highest education level: Not on file  Occupational History   Occupation: Retired    Comment: Printmaker  Tobacco Use   Smoking status: Never   Smokeless tobacco: Never  Vaping Use   Vaping Use: Never used  Substance and Sexual Activity   Alcohol use: No    Alcohol/week: 0.0 standard drinks of alcohol   Drug use: No   Sexual activity: Not Currently    Birth control/protection: Surgical  Other Topics Concern   Not on file  Social History Narrative   Current Social History 04/23/2021        Patient lives with husband in a one level home with ramp      Patient's method of transportation is personal car shared with her mother.      The highest level of education was high school diploma.      The patient currently retired from [employer].   Social Determinants of Health   Financial Resource Strain: Medium Risk (07/01/2022)   Overall Financial Resource Strain (CARDIA)    Difficulty of Paying Living Expenses: Somewhat hard  Food Insecurity: No Food Insecurity (11/17/2022)   Hunger Vital Sign    Worried About Running Out of Food in the Last Year: Never true    Ran Out of Food in the Last Year: Never true  Transportation Needs: No Transportation Needs (07/01/2022)   PRAPARE - Hydrologist (Medical): No    Lack of Transportation (Non-Medical): No  Physical Activity: Inactive (07/01/2022)   Exercise Vital Sign    Days of Exercise per Week: 0 days    Minutes of Exercise per Session: 0 min  Stress: No Stress Concern Present (07/01/2022)   [place]    Feeling of Stress : Not at all  Social Connections: [place] (11/17/2022)   Social Connection and Isolation Panel [NHANES]    Frequency of Communication with Friends  and Family: More than three times a week    Frequency of Social Gatherings with Friends and Family: More than three times a week    Attends Religious Services: More than 4 times per year    Active Member of [employer] or Organizations: Yes    Attends Archivist Meetings: More than 4 times per year  Marital Status: Married  Human resources officer Violence: Not At Risk (11/17/2022)   Humiliation, Afraid, Rape, and Kick questionnaire    Fear of Current or Ex-Partner: No    Emotionally Abused: No    Physically Abused: No    Sexually Abused: No    Review of Systems: ROS negative except for what is noted on the assessment and plan.  Objective:   Vitals:   01/06/23 1011  BP: (!) 113/59  Pulse: 78  Temp: 97.6 F (36.4 C)  TempSrc: Oral  SpO2: 100%  Weight: 143 lb 8 oz (65.1 kg)  Height: '5\' 1"'$  (1.549 m)    Physical Exam: Constitutional: well-appearing elderly woman sitting in chair, in no acute distress HENT: normocephalic atraumatic, mucous membranes moist Eyes: conjunctiva non-erythematous Neck: supple Cardiovascular: regular rate and rhythm, no m/r/g Pulmonary/Chest: normal work of breathing on room air, lungs clear to auscultation bilaterally Abdominal: soft, non-tender, non-distended MSK: normal bulk and tone Neurological: alert & oriented x 3, moving all extremities, normal gait Skin: warm and dry Psych: Pleasant mood and affect     Assessment & Plan:   Essential hypertension Patient presented for hypertension follow up. Last OV 12/29/22. At that time BP 160 ->158/76. Patient was restarted on amlodipine 5 mg, decreased olmesartan 20 mg, and increased Chlorthalidone 25 mg daily. She mentions her  blood pressures have been much lower at home than they had been, with episodes of lightlessness when walking around without chest pain or DOE. She has been compliant with medications. She also notes that though her stools are not as loose as when she had been on amlodipine before,  she has started having looser stools since restarting this medication.  Patient denies palpitations, chest pain, muscle weakness or spasms.   Vitals:   01/06/23 1011  BP: (!) 113/59    Patient BP is much lower than prior. Suspect lightheadedness may be related to hypotensive episodes. Given this and the risk associated with hypotensive episodes, will de-escalate antihypertensive regimen. Though rare, Gi side effects can happen with amlodipine, so I will stop this medication today. Will recheck BMP today to assess K and BMP. Will continue 20 mg olmesartan and current dose of chlorthalidone with strict instructions to reduce chlorthalidone dose if lightheadedness episodes continue to occur. -BMP today -Continue olmesartan 20 mg  -Continue chlorthalidone 25 mg -BMP today to recheck K  -Monitor BP daily   Type II diabetes mellitus with complication [facility]) Patient still having difficulty affording SGLT2 inhibitors. Offered other samples and emailed [facility] pharmacist Zane Herald for potential help with this medication moving forward. Last GFR 39 on 1/11 from 32.  Last urine microalbumin/Cr ratio 77 12/2021. Patient unable to provide urine sample today  Patient has a month supply of Rybelsus but will need medication assistance or veirify coverage at follow up. If none, will need to swap medications as she is not able to afford it.  Hyperkalemia Patient denies palpitations, chest pain, muscle weakness or spasms. -BMP today   Patient  discussed  Dr. Cain Sieve

## 2023-01-06 NOTE — Assessment & Plan Note (Addendum)
Patient still having difficulty affording SGLT2 inhibitors. Offered other samples and emailed [facility] pharmacist Zane Herald for potential help with this medication moving forward. Last GFR 39 on 1/11 from 32.  Last urine microalbumin/Cr ratio 77 12/2021. Patient unable to provide urine sample today  Patient has a month supply of Rybelsus but will need medication assistance or veirify coverage at follow up. If none, will need to swap medications as she is not able to afford it.

## 2023-01-07 LAB — BMP8+ANION GAP
Anion Gap: 15 mmol/L (ref 10.0–18.0)
BUN/Creatinine Ratio: 22 (ref 12–28)
BUN: 37 mg/dL — ABNORMAL HIGH (ref 8–27)
CO2: 21 mmol/L (ref 20–29)
Calcium: 9 mg/dL (ref 8.7–10.3)
Chloride: 106 mmol/L (ref 96–106)
Creatinine, Ser: 1.71 mg/dL — ABNORMAL HIGH (ref 0.57–1.00)
Glucose: 63 mg/dL — ABNORMAL LOW (ref 70–99)
Potassium: 4.9 mmol/L (ref 3.5–5.2)
Sodium: 142 mmol/L (ref 134–144)
eGFR: 32 mL/min/{1.73_m2} — ABNORMAL LOW (ref >59)

## 2023-01-09 ENCOUNTER — Other Ambulatory Visit (HOSPITAL_COMMUNITY): Payer: Self-pay

## 2023-01-09 NOTE — Progress Notes (Signed)
Patient called and aware. Slight increase in Cr from patient's 1.4-1.6 baseline, likely in the setting of recent medication changes. GFR in range. Will continue to monitor.   K wnl.   Per patient, no new lightheadedness or hypotensive episodes since discontinuing amlodipine. No changes in medications at this time.

## 2023-01-09 NOTE — Progress Notes (Signed)
Internal Medicine Clinic Attending  Case discussed with Dr. Altamease Oiler At the time of the visit.  We reviewed the resident's history and exam and pertinent patient test results.  I agree with the assessment, diagnosis, and plan of care documented in the resident's note.    We are stopping the amlodipine '5mg'$  daily today. She will continue on olmesartan & chlorthalidone. At next visit, please repeat blood pressure & BMP, and keep adjusting regimen as needed.

## 2023-01-11 ENCOUNTER — Ambulatory Visit (INDEPENDENT_AMBULATORY_CARE_PROVIDER_SITE_OTHER): Payer: Medicare Other | Admitting: Dietician

## 2023-01-11 ENCOUNTER — Encounter: Payer: Medicare Other | Admitting: Internal Medicine

## 2023-01-11 VITALS — Wt 145.0 lb

## 2023-01-11 DIAGNOSIS — E118 Type 2 diabetes mellitus with unspecified complications: Secondary | ICD-10-CM

## 2023-01-11 MED ORDER — EMPAGLIFLOZIN 10 MG PO TABS: 10.0000 mg | ORAL_TABLET | Freq: Every day | ORAL | 2 refills | Status: DC

## 2023-01-11 NOTE — Progress Notes (Signed)
Diabetes Self-Management Education  Visit Type:  Annual Follow-Up  Appt. Start Time: 1015 Appt. End Time: 11.00  01/11/2023  Ms. Airika Alkhatib, identified by name and date of birth, is a 72 y.o. female with a diagnosis of Diabetes:  .   ASSESSMENT  Weight 145 lb (65.8 kg). Body mass index is 27.4 kg/m.    Diabetes Self-Management Education - 01/11/23 1100       Health Coping   How would you rate your overall health? Good      Psychosocial Assessment   Patient Belief/Attitude about Diabetes Motivated to manage diabetes    What is the hardest part about your diabetes right now, causing you the most concern, or is the most worrisome to you about your diabetes?   --   Ask her in a futur visist.   Self-care barriers Lack of material resources    Self-management support Family    Patient Concerns Monitoring;Glycemic Control    Special Needs None    Preferred Learning Style Visual;Hands on    Learning Readiness Ready      Subsequent Visit   Since your last visit have you continued or begun to take your medications as prescribed? Yes   Her heavy meals is at lunch, she away from home, and she does not take with her.She keep her [place]um[place] in the refrigerator.   Since your last visit have you had your blood pressure checked? No    Since your last visit have you experienced any weight changes? No change    Since your last visit, are you checking your blood glucose at least once a day? Yes             Learning Objective:  Patient will have a greater understanding of diabetes self-management. Patient education plan is to attend individual and/or group sessions per assessed needs and concerns.   Plan:   Patient Instructions  Storage of your insulins:   1- Throw away the [place] you are using after 28 days, even if it still has insulin left in it. It does not have to be refrigerator after first use. It is best to carry it with you at all times with a few extra pen needles.    2- Throw away [place] after after 56 days, you should empty it every 27 days.  Your blood sugars are much better.     3- Continue Jardiance per her Dr Evette Doffing.    Expected Outcomes:     Education material provided: Diabetes Resources  If problems or questions, patient to contact team via:  Phone  Future DSME appointment: - 2 week [name], RD 01/11/2023 12:08 PM.

## 2023-01-11 NOTE — Telephone Encounter (Signed)
Resubmitted pg one with missing info 01/05/23 and 01/11/23.

## 2023-01-11 NOTE — Patient Instructions (Addendum)
Storage of your insulins:   1- Throw away the [place] you are using after 28 days, even if it still has insulin left in it. It does not have to be refrigerator after first use. It is best to carry it with you at all times with a few extra pen needles.   2- Throw away [place] after after 56 days, you should empty it every 27 days.  Your blood sugars are much better.     3- Continue Jardiance per her Dr Evette Doffing.

## 2023-01-12 ENCOUNTER — Other Ambulatory Visit (HOSPITAL_COMMUNITY): Payer: Self-pay

## 2023-01-12 ENCOUNTER — Telehealth: Payer: Self-pay

## 2023-01-12 NOTE — Telephone Encounter (Signed)
Received notification from [employer] [place] regarding RE-ENROLLMENT approval for RYBELSUS '7MG'$ . Patient assistance approved from 01/10/23 to 12/19/23.  MEDICATION WILL SHIP TO OFFICE  Phone: [phone]

## 2023-01-13 ENCOUNTER — Other Ambulatory Visit (HOSPITAL_COMMUNITY): Payer: Self-pay

## 2023-01-23 ENCOUNTER — Ambulatory Visit (INDEPENDENT_AMBULATORY_CARE_PROVIDER_SITE_OTHER): Payer: Medicare Other | Admitting: Dietician

## 2023-01-23 DIAGNOSIS — E119 Type 2 diabetes mellitus without complications: Secondary | ICD-10-CM | POA: Diagnosis not present

## 2023-01-23 NOTE — Progress Notes (Signed)
Diabetes Self-Management Education  Visit Type:  Annual Follow-Up (#3)  Appt. Start Time: 1115 Appt. End Time: 1150  01/23/2023  [name], identified by name and date of birth, is a 72 y.o. female with a diagnosis of Diabetes:  .   ASSESSMENT  There were no vitals taken for this visit. There is no height or weight on file to calculate BMI.    Diabetes Self-Management Education - 01/23/23 1300       Health Coping   How would you rate your overall health? Good      Pre-Education Assessment   Patient understands incorporating nutritional management into lifestyle. Needs Review    Patient understands using medications safely. Needs Review    Patient understands monitoring blood glucose, interpreting and using results Needs Review      Complications   Last HgB A1C per patient/outside source 7.7 %    How often do you check your blood sugar? > 4 times/day    Fasting Blood glucose range (mg/dL) 70-129;130-179    Postprandial Blood glucose range (mg/dL) 130-179;180-200;>200    Number of hypoglycemic episodes per month 10    Can you tell when your blood sugar is low? Yes    What do you do if your blood sugar is low? drinks sweet drink or eats candy    Number of hyperglycemic episodes ( >'200mg'$ /dL): Daily    Have you had a dilated eye exam in the past 12 months? Yes    Have you had a dental exam in the past 12 months? Yes    Are you checking your feet? Yes    How many days per week are you checking your feet? 7      Dietary Intake   Lunch peanut butter and jelly sandwich    Beverage(s) water, lemonade, sweet tea, cheerwine, pepsi      Activity / Exercise   Activity / Exercise Type ADL's;Light (walking / raking leaves)    How many days per week do you exercise? 5    How many minutes per day do you exercise? 30    Total minutes per week of exercise 150      Patient Education   Healthy Eating Meal timing in regards to the patients' current diabetes medication.;Information on  hints to eating out and maintain blood glucose control.;Carbohydrate counting    Medications Reviewed patients medication for diabetes, action, purpose, timing of dose and side effects.      Patient Self-Evaluation of Goals - Patient rates self as meeting previously set goals (% of time)   Problem Solving and behavior change strategies  50 - 75 % (half of the time)   she says she is doing better, but still forgets at times to take her insulin with her.     Post-Education Assessment   Patient understands incorporating nutritional management into lifestyle. Comprehends key points    Patient understands using medications safely. Comphrehends key points    Patient understands monitoring blood glucose, interpreting and using results Demonstrates understanding / competency      Outcomes   Program Status Completed      Subsequent Visit   Since your last visit have you continued or begun to take your medications as prescribed? Yes   only taking 3 units at lunch and dinner due to occasional  postmeal hypoglycemia   Since your last visit have you had your blood pressure checked? No    Since your last visit have you experienced any weight changes? --  not assessed today   Since your last visit, are you checking your blood glucose at least once a day? Yes             Learning Objective:  Patient will have a greater understanding of diabetes self-management. Patient education plan is to attend individual and/or group sessions per assessed needs and concerns.   Plan:   Patient Instructions  You've graduated by successfully applying your Dexcom Sensor today without assistance.   We talked about having your starches and sugary foods with your meals so the insulin you take will cover them. That way you do not have to take an injection for snacks or in between meals.   Good job Radio broadcast assistant with you along with pen needles so you can take it when you eat out.   I suggest taking your  usual meal time dose 10-15 minutes before eating when blood sugar is >90 mg/dl.   Each 4 ounces of soda is like eating a piece of bread.          8 ounces = like eating 2 slices bread         12 ounces (1 can ) = like eating 3 slices of bread         20 ounces (1 bottle) = like eating 5 slices of bread  I suggest talking to a doctor about taking 2 units for a light meal, 3 units for a "usual" meal amount and 4 units for a larger than usual meal or you have a sweet drink.   We can follow up as needed.   Butch Penny [phone]      Expected Outcomes:  Demonstrated interest in learning. Expect positive outcomes  Education material provided: Diabetes Resources  If problems or questions, patient to contact team via:  Phone  Future DSME appointment: - [place] Kehinde Totzke, RD 01/23/2023 1:29 PM.

## 2023-01-23 NOTE — Patient Instructions (Addendum)
You've graduated by successfully applying your Dexcom Sensor today without assistance.   We talked about having your starches and sugary foods with your meals so the insulin you take will cover them. That way you do not have to take an injection for snacks or in between meals.   Good job Radio broadcast assistant with you along with pen needles so you can take it when you eat out.   I suggest taking your usual meal time dose 10-15 minutes before eating when blood sugar is >90 mg/dl.   Each 4 ounces of soda is like eating a piece of bread.          8 ounces = like eating 2 slices bread         12 ounces (1 can ) = like eating 3 slices of bread         20 ounces (1 bottle) = like eating 5 slices of bread  I suggest talking to a doctor about taking 2 units for a light meal, 3 units for a "usual" meal amount and 4 units for a larger than usual meal or you have a sweet drink.   We can follow up as needed.   Butch Penny [phone]

## 2023-01-27 ENCOUNTER — Ambulatory Visit: Payer: Medicare Other | Admitting: Cardiovascular Disease

## 2023-01-30 ENCOUNTER — Telehealth: Payer: Self-pay

## 2023-01-30 NOTE — Telephone Encounter (Signed)
Rec'd 4 boxes of Rybelsus 49m - 01/25/23  Informed patient her novo nordisk shipment is ready for pickup.  Medication labeled and ready in med room.  Pt also wants to follow up on [employer] application for jardiance. Will need samples. Informed patient I would call her back with an update and attempt to get samples provided in meantime.

## 2023-01-30 NOTE — Telephone Encounter (Signed)
Reached out to company to f/u on application status. All information rec'd, however patient will need to apply for [place] with Social Security. If denied, denial letter will need to be faxed to [facility].   In meantime, patient will need samples.   Could 2 weeks worth of Jardiance 53m samples be put together for patient?

## 2023-01-31 NOTE — Progress Notes (Signed)
CARDIOLOGY CONSULT NOTE       Patient ID: Amanda [name] MRN: [id number] DOB/AGE: 1951-09-01 71 y.o.  Admit date: (Not on file) Referring Physician: [name] Primary Physician: Christiana Fuchs, DO Primary Cardiologist: New Reason for Consultation: Exertional Dyspnea     HPI:  72 y.o. referred by Dr Howie Ill for exertional dyspnea History of HTN, HLD, CKD, DM and anemia. She had some over Rx of her BP with dizziness and meds scaled back 01/06/23   DM poorly controlled A1c 10.9 LDL 64 on statin  Baseline Cr 1.7 as high as 2.1 in past   TTE done 12/30/20 EF 60-65% no LVH normal diastolic parameters mild MR  She cares for her disabled husband with ALS. She is active with more chronic exertional dyspnea for over a year No edema. No chest pain Diet has been poor and worse control of her DM No documented CAD but no recent stress test   ROS All other systems reviewed and negative except as noted above  Past Medical History:  Diagnosis Date   Allergy    SEASONAL   Arthritis    Cervical radiculopathy due to degenerative joint disease of spine 05/03/2017   CKD (chronic kidney disease) stage 3, GFR 30-59 ml/min ([place]) 01/01/2021   Diabetes mellitus 1996   Glaucoma    H. pylori infection    Hyperlipidemia    Hypertension    Normocytic anemia 02/14/2013   Osteopenia 05/04/2016   Pain of both shoulder joints 11/14/2016    Family History  Problem Relation Age of Onset   Diabetes Mother    Hypertension Mother    Breast cancer Mother    Cancer Mother    Diabetes Sister    Hypertension Sister    Kidney disease Sister        One Kidney transplant, one sister on HD   Breast cancer Sister    Cancer Sister    Hypertension Sister    Diabetes Sister    Cerebral palsy Brother    Cancer Brother        Unknown   Hypertension Daughter    Colon cancer Neg Hx    Esophageal cancer Neg Hx    Stomach cancer Neg Hx    Rectal cancer Neg Hx     Social History   Socioeconomic History    Marital status: Married    Spouse name: Not on file   Number of children: 1   Years of education: Not on file   Highest education level: Not on file  Occupational History   Occupation: Retired    Comment: Printmaker  Tobacco Use   Smoking status: Never   Smokeless tobacco: Never  Vaping Use   Vaping Use: Never used  Substance and Sexual Activity   Alcohol use: No    Alcohol/week: 0.0 standard drinks of alcohol   Drug use: No   Sexual activity: Not Currently    Birth control/protection: Surgical  Other Topics Concern   Not on file  Social History Narrative   Current Social History 04/23/2021        Patient lives with husband in a one level home with ramp      Patient's method of transportation is personal car shared with her mother.      The highest level of education was high school diploma.      The patient currently retired from Print[employer].   Social Determinants of Health   Financial Resource Strain: Medium Risk (07/01/2022)   Overall  Financial Resource Strain (CARDIA)    Difficulty of Paying Living Expenses: Somewhat hard  Food Insecurity: No Food Insecurity (11/17/2022)   Hunger Vital Sign    Worried About Running Out of Food in the Last Year: Never true    Ran Out of Food in the Last Year: Never true  Transportation Needs: No Transportation Needs (07/01/2022)   PRAPARE - Hydrologist (Medical): No    Lack of Transportation (Non-Medical): No  Physical Activity: Inactive (07/01/2022)   Exercise Vital Sign    Days of Exercise per Week: 0 days    Minutes of Exercise per Session: 0 min  Stress: No Stress Concern Present (07/01/2022)   [place]    Feeling of Stress : Not at all  Social Connections: [place] (11/17/2022)   Social Connection and Isolation Panel [NHANES]    Frequency of Communication with Friends and Family: More than three times a week     Frequency of Social Gatherings with Friends and Family: More than three times a week    Attends Religious Services: More than 4 times per year    Active Member of [employer] or Organizations: Yes    Attends Music therapist: More than 4 times per year    Marital Status: Married  Human resources officer Violence: Not At Risk (11/17/2022)   Humiliation, Afraid, Rape, and Kick questionnaire    Fear of Current or Ex-Partner: No    Emotionally Abused: No    Physically Abused: No    Sexually Abused: No    Past Surgical History:  Procedure Laterality Date   ABDOMINAL HYSTERECTOMY     10 years ago    BREAST BIOPSY     BREAST LUMPECTOMY WITH RADIOACTIVE SEED LOCALIZATION Right 02/16/2022   Procedure: RIGHT BREAST LUMPECTOMY WITH RADIOACTIVE SEED LOCALIZATION;  Surgeon: Erroll Luna, MD;  Location: [facility];  Service: General;  Laterality: Right;   Cataracts     ROTATOR CUFF REPAIR     SHOULDER SURGERY     Rotator cuff tear       Current Outpatient Medications:    acetaminophen (TYLENOL) 325 MG tablet, Take 2 tablets (650 mg total) by mouth every 6 (six) hours as needed (or Fever >/= 101)., Disp: 60 tablet, Rfl: 0   amLODipine (NORVASC) 5 MG tablet, Take 1 tablet (5 mg total) by mouth daily., Disp: 30 tablet, Rfl: 11   amoxicillin (AMOXIL) 500 MG capsule, Take 1 capsule (500 mg total) by mouth 3 (three) times daily., Disp: 18 capsule, Rfl: 0   atorvastatin (LIPITOR) 40 MG tablet, Take 1 tablet (40 mg total) by mouth daily., Disp: 90 tablet, Rfl: 2   blood glucose meter kit and supplies KIT, Dispense based on patient and insurance preference. Use up to four times daily as directed. ., Disp: 1 each, Rfl: 0   chlorthalidone (HYGROTON) 25 MG tablet, Take 1 tablet (25 mg total) by mouth daily., Disp: 90 tablet, Rfl: 3   ciclopirox (PENLAC) 8 % solution, Apply topically at bedtime. Apply over nail and surrounding skin. Apply daily over previous coat. After seven (7) days, may remove with alcohol and  continue cycle., Disp: 6.6 mL, Rfl: 0   Continuous Blood Gluc Receiver ([place]) DEVI, Use to check blood glucose before upon waking up, meals, at bedtime, and as needed,, Disp: 1 each, Rfl: 0   Continuous Blood Gluc Sensor ([place]) MISC, Apply to back of arm  as directed. Change every 10 days., Disp: 9 each, Rfl: 2   diclofenac Sodium (VOLTAREN) 1 % GEL, Apply 2 g topically daily as needed (hand pain)., Disp: 100 g, Rfl: 2   empagliflozin (JARDIANCE) 10 MG TABS tablet, Take 1 tablet (10 mg total) by mouth daily before breakfast., Disp: 30 tablet, Rfl: 2   fluticasone (FLONASE) 50 MCG/ACT nasal spray, Place 1 spray into both nostrils daily., Disp: 16 g, Rfl: 2   glucose 4 GM chewable tablet, Chew 1 tablet (4 g total) by mouth as needed for low blood sugar., Disp: 50 tablet, Rfl: 12   glucose blood test strip, You do not medically need to check blood sugar while not taking insulin. You can check your blood sugar if you are concerned when you want, but do not need to, Disp: 100 each, Rfl: 12   ibuprofen (ADVIL) 800 MG tablet, Take 1 tablet by mouth every six to eight hours as needed for pain, Disp: 18 tablet, Rfl: 0   insulin degludec (TRESIBA FLEXTOUCH) 100 UNIT/ML FlexTouch Pen, Inject 9 Units into the skin daily., Disp: 3 mL, Rfl: 6   Insulin Lispro-aabc (LYUMJEV KWIKPEN) 100 UNIT/ML KwikPen, Inject 2 Units into the skin daily with breakfast AND 3 Units daily with lunch AND 3 Units daily with supper., Disp: 3 mL, Rfl: 12   Insulin Pen Needle (PEN NEEDLES) 32G X 4 MM MISC, Use as directed., Disp: 100 each, Rfl: 10   latanoprost (XALATAN) 0.005 % ophthalmic solution, Place 1 drop into both eyes at bedtime., Disp: , Rfl:    metFORMIN (GLUCOPHAGE-XR) 500 MG 24 hr tablet, Take 1 tablet (500 mg total) by mouth daily with breakfast., Disp: 90 tablet, Rfl: 3   Multiple Vitamins-Minerals (WOMENS 50+ MULTI VITAMIN/MIN) TABS, Take 1 tablet by mouth daily., Disp: 30 tablet, Rfl:    olmesartan  (BENICAR) 20 MG tablet, Take 1 tablet (20 mg total) by mouth daily., Disp: 30 tablet, Rfl: 11   Semaglutide (RYBELSUS) 7 MG TABS, Take 1 tablet (7 mg) by mouth daily., Disp: 90 tablet, Rfl: 5   traMADol (ULTRAM) 50 MG tablet, Take 1 tablet (50 mg total) by mouth every 6 (six) hours as needed for dental pain, Disp: 10 tablet, Rfl: 0    Physical Exam: Blood pressure (!) 140/70, pulse 88, height 5' 1.5" (1.562 m), weight 154 lb (69.9 kg), SpO2 97 %.    Affect appropriate Healthy:  appears stated age 85: normal Neck supple with no adenopathy JVP norma left bruits no thyromegaly Lungs clear with no wheezing and good diaphragmatic motion Heart:  S1/S2 no murmur, no rub, gallop or click PMI normal Abdomen: benighn, BS positve, no tenderness, no AAA no bruit.  No HSM or HJR Distal pulses intact with no bruits No edema Neuro non-focal Skin warm and dry No muscular weakness  Labs:   Lab Results  Component Value Date   WBC 6.5 02/10/2022   HGB 10.8 (L) 02/10/2022   HCT 33.5 (L) 02/10/2022   MCV 93.3 02/10/2022   PLT 335 02/10/2022   No results for input(s): "NA", "K", "CL", "CO2", "BUN", "CREATININE", "CALCIUM", "PROT", "BILITOT", "ALKPHOS", "ALT", "AST", "GLUCOSE" in the last 168 hours.  Invalid input(s): "LABALBU" Lab Results  Component Value Date   CKTOTAL 309 (H) 09/16/2019   CKMB 7.2 (H) 09/14/2019   TROPONINI <0.30 01/02/2013    Lab Results  Component Value Date   CHOL 156 12/29/2022   CHOL 149 11/17/2021   CHOL 144 10/12/2020   Lab Results  Component Value Date   HDL 83 12/29/2022   HDL 71 11/17/2021   HDL 73 10/12/2020   Lab Results  Component Value Date   LDLCALC 64 12/29/2022   LDLCALC 68 11/17/2021   LDLCALC 59 10/12/2020   Lab Results  Component Value Date   TRIG 35 12/29/2022   TRIG 46 11/17/2021   TRIG 56 10/12/2020   Lab Results  Component Value Date   CHOLHDL 1.9 12/29/2022   CHOLHDL 2.1 11/17/2021   CHOLHDL 2.0 10/12/2020   No results  found for: "LDLDIRECT"    Radiology: No results found.  EKG: SR rate 82 normal 02/10/22  02/14/2023 SR rate 82 normal    ASSESSMENT AND PLAN:   Dyspnea:  etiology not clear ECG normal prior echo normal EF no diastolic abnormality update TTE Check BNP. Ex Myovue to r/o CAD  HTN:  well controlled on current meds HLD:  at goal on statin  CKD:  F/U [place] kidney baseline Cr 1.7 DM:  poor control discussed low carb diet f/u primary Bruit:  left carotid f/u duplex   BNP TTE Ex Myovue Carotid duplex   F/U cardiology PRN pending testing   Signed: Jenkins Rouge 02/14/2023, 11:19 AM

## 2023-02-01 NOTE — Telephone Encounter (Signed)
Informed patient she will need to apply for LIS.  Provided samples in meantime.  Medication Samples have been provided to the patient.  Drug name: JARDIANCE       Strength: 10MG        Qty: 14 TABS  LOT: [id number]  Exp.Date: 09/2024  Dosing instructions: TAKE 1 DAILY BEFORE BREAKFAST  Talbert Cage [place] 4:11 PM 02/01/2023

## 2023-02-02 ENCOUNTER — Telehealth: Payer: Self-pay

## 2023-02-02 ENCOUNTER — Other Ambulatory Visit (HOSPITAL_COMMUNITY): Payer: Self-pay

## 2023-02-02 NOTE — Telephone Encounter (Signed)
Able to put sensor on by herself today. Feels that blood sugars are doing well. Wants to get shingles vaccine and asked if A1C would be done Monday when she sees Dr. Howie Ill. I told her she could discuss these things at her appointment on Monday.

## 2023-02-02 NOTE — Telephone Encounter (Signed)
Patient here to pick up samples of Ryblsus 7 mg and Jardiance 10 mg. Given to patient by Dr.  Daryll Drown.

## 2023-02-02 NOTE — Telephone Encounter (Signed)
Patient called she stated everything is great and wanted to give you a report. Patient is requesting a call back.

## 2023-02-06 ENCOUNTER — Other Ambulatory Visit (HOSPITAL_COMMUNITY): Payer: Self-pay

## 2023-02-06 ENCOUNTER — Encounter: Payer: Self-pay | Admitting: Internal Medicine

## 2023-02-06 ENCOUNTER — Ambulatory Visit (INDEPENDENT_AMBULATORY_CARE_PROVIDER_SITE_OTHER): Payer: Medicare Other | Admitting: Internal Medicine

## 2023-02-06 VITALS — BP 143/64 | HR 74 | Ht 61.0 in | Wt 148.9 lb

## 2023-02-06 DIAGNOSIS — B029 Zoster without complications: Secondary | ICD-10-CM | POA: Insufficient documentation

## 2023-02-06 DIAGNOSIS — E1121 Type 2 diabetes mellitus with diabetic nephropathy: Secondary | ICD-10-CM

## 2023-02-06 DIAGNOSIS — Z23 Encounter for immunization: Secondary | ICD-10-CM

## 2023-02-06 DIAGNOSIS — F331 Major depressive disorder, recurrent, moderate: Secondary | ICD-10-CM

## 2023-02-06 DIAGNOSIS — E118 Type 2 diabetes mellitus with unspecified complications: Secondary | ICD-10-CM

## 2023-02-06 DIAGNOSIS — Z794 Long term (current) use of insulin: Secondary | ICD-10-CM

## 2023-02-06 DIAGNOSIS — E1142 Type 2 diabetes mellitus with diabetic polyneuropathy: Secondary | ICD-10-CM | POA: Diagnosis not present

## 2023-02-06 DIAGNOSIS — I10 Essential (primary) hypertension: Secondary | ICD-10-CM

## 2023-02-06 DIAGNOSIS — Z7984 Long term (current) use of oral hypoglycemic drugs: Secondary | ICD-10-CM

## 2023-02-06 DIAGNOSIS — Z9641 Presence of insulin pump (external) (internal): Secondary | ICD-10-CM

## 2023-02-06 LAB — POCT GLYCOSYLATED HEMOGLOBIN (HGB A1C): Hemoglobin A1C: 7.6 % — AB (ref 4.0–5.6)

## 2023-02-06 LAB — GLUCOSE, CAPILLARY
Glucose-Capillary: 61 mg/dL — ABNORMAL LOW (ref 70–99)
Glucose-Capillary: 121 mg/dL — ABNORMAL HIGH (ref 70–99)

## 2023-02-06 MED ORDER — TRESIBA FLEXTOUCH 100 UNIT/ML ~~LOC~~ SOPN
9.0000 [IU] | PEN_INJECTOR | Freq: Every day | SUBCUTANEOUS | 6 refills | Status: DC
  Filled 2023-02-06: qty 3, 33d supply, fill #0

## 2023-02-06 MED ORDER — AMLODIPINE BESYLATE 5 MG PO TABS
5.0000 mg | ORAL_TABLET | Freq: Every day | ORAL | 11 refills | Status: DC
  Filled 2023-02-06: qty 30, 30d supply, fill #0
  Filled 2023-03-15: qty 30, 30d supply, fill #1
  Filled 2023-04-18: qty 30, 30d supply, fill #2
  Filled 2023-05-26: qty 30, 30d supply, fill #3
  Filled 2023-07-15: qty 30, 30d supply, fill #4
  Filled 2023-08-16: qty 30, 30d supply, fill #5
  Filled 2023-09-20: qty 30, 30d supply, fill #6
  Filled 2023-10-25: qty 30, 30d supply, fill #7
  Filled 2023-11-13 – 2023-11-17 (×3): qty 30, 30d supply, fill #8
  Filled 2023-12-18: qty 30, 30d supply, fill #9
  Filled 2024-01-22 (×2): qty 30, 30d supply, fill #10

## 2023-02-06 MED ORDER — LYUMJEV KWIKPEN 100 UNIT/ML ~~LOC~~ SOPN
PEN_INJECTOR | SUBCUTANEOUS | 12 refills | Status: DC
  Filled 2023-02-06: qty 3, 28d supply, fill #0
  Filled 2023-04-26: qty 3, 28d supply, fill #1

## 2023-02-06 MED ORDER — GLUCOSE 4 G PO CHEW
1.0000 | CHEWABLE_TABLET | ORAL | 12 refills | Status: AC | PRN
  Filled 2023-02-06: qty 50, fill #0

## 2023-02-06 NOTE — Progress Notes (Signed)
Subjective:  CC: follow-up for diabetes  HPI:  Amanda [name] is a 72 y.o. female with a past medical history type 2 diabetes, hypertension and CKD stage III who presents today for diabetes and hypertension. Please see problem based assessment and plan for additional details.  Past Medical History:  Diagnosis Date   Allergy    SEASONAL   Arthritis    Cervical radiculopathy due to degenerative joint disease of spine 05/03/2017   CKD (chronic kidney disease) stage 3, GFR 30-59 ml/min ([place]) 01/01/2021   Diabetes mellitus 1996   Glaucoma    H. pylori infection    Hyperlipidemia    Hypertension    Normocytic anemia 02/14/2013   Osteopenia 05/04/2016   Pain of both shoulder joints 11/14/2016    Current Outpatient Medications on File Prior to Visit  Medication Sig Dispense Refill   acetaminophen (TYLENOL) 325 MG tablet Take 2 tablets (650 mg total) by mouth every 6 (six) hours as needed (or Fever >/= 101). 60 tablet 0   atorvastatin (LIPITOR) 40 MG tablet Take 1 tablet (40 mg total) by mouth daily. 90 tablet 2   blood glucose meter kit and supplies KIT Dispense based on patient and insurance preference. Use up to four times daily as directed. . 1 each 0   chlorthalidone (HYGROTON) 25 MG tablet Take 1 tablet (25 mg total) by mouth daily. 90 tablet 3   ciclopirox (PENLAC) 8 % solution Apply topically at bedtime. Apply over nail and surrounding skin. Apply daily over previous coat. After seven (7) days, may remove with alcohol and continue cycle. 6.6 mL 0   Continuous Blood Gluc Receiver (DEXCOM G7 RECEIVER) DEVI Use to check blood glucose before upon waking up, meals, at bedtime, and as needed, 1 each 0   Continuous Blood Gluc Sensor (DEXCOM G7 SENSOR) MISC Apply to back of arm as directed. Change every 10 days. 9 each 2   diclofenac Sodium (VOLTAREN) 1 % GEL Apply 2 g topically daily as needed (hand pain). 100 g 2   empagliflozin (JARDIANCE) 10 MG TABS tablet Take 1 tablet (10 mg  total) by mouth daily before breakfast. 30 tablet 2   fluticasone (FLONASE) 50 MCG/ACT nasal spray Place 1 spray into both nostrils daily. 16 g 2   glucose blood test strip You do not medically need to check blood sugar while not taking insulin. You can check your blood sugar if you are concerned when you want, but do not need to 100 each 12   Insulin Pen Needle (PEN NEEDLES) 32G X 4 MM MISC Use as directed. 100 each 10   latanoprost (XALATAN) 0.005 % ophthalmic solution Place 1 drop into both eyes at bedtime.     metFORMIN (GLUCOPHAGE-XR) 500 MG 24 hr tablet Take 1 tablet (500 mg total) by mouth daily with breakfast. 90 tablet 3   Multiple Vitamins-Minerals (WOMENS 50+ MULTI VITAMIN/MIN) TABS Take 1 tablet by mouth daily. 30 tablet    olmesartan (BENICAR) 20 MG tablet Take 1 tablet (20 mg total) by mouth daily. 30 tablet 11   Semaglutide (RYBELSUS) 7 MG TABS Take 1 tablet (7 mg) by mouth daily. 90 tablet 5   [DISCONTINUED] glipiZIDE (GLUCOTROL) 5 MG tablet Take 0.5 tablets (2.5 mg total) by mouth daily before breakfast. 15 tablet 0   No current facility-administered medications on file prior to visit.    Family History  Problem Relation Age of Onset   Diabetes Mother    Hypertension Mother  Breast cancer Mother    Cancer Mother    Diabetes Sister    Hypertension Sister    Kidney disease Sister        One Kidney transplant, one sister on HD   Breast cancer Sister    Cancer Sister    Hypertension Sister    Diabetes Sister    Cerebral palsy Brother    Cancer Brother        Unknown   Hypertension Daughter    Colon cancer Neg Hx    Esophageal cancer Neg Hx    Stomach cancer Neg Hx    Rectal cancer Neg Hx     Social History   Socioeconomic History   Marital status: Married    Spouse name: Not on file   Number of children: 1   Years of education: Not on file   Highest education level: Not on file  Occupational History   Occupation: Retired    Comment: Printmaker   Tobacco Use   Smoking status: Never   Smokeless tobacco: Never  Vaping Use   Vaping Use: Never used  Substance and Sexual Activity   Alcohol use: No    Alcohol/week: 0.0 standard drinks of alcohol   Drug use: No   Sexual activity: Not Currently    Birth control/protection: Surgical  Other Topics Concern   Not on file  Social History Narrative   Current Social History 04/23/2021        Patient lives with husband in a one level home with ramp      Patient's method of transportation is personal car shared with her mother.      The highest level of education was high school diploma.      The patient currently retired from [employer].   Social Determinants of Health   Financial Resource Strain: Medium Risk (07/01/2022)   Overall Financial Resource Strain (CARDIA)    Difficulty of Paying Living Expenses: Somewhat hard  Food Insecurity: No Food Insecurity (11/17/2022)   Hunger Vital Sign    Worried About Running Out of Food in the Last Year: Never true    Ran Out of Food in the Last Year: Never true  Transportation Needs: No Transportation Needs (07/01/2022)   PRAPARE - Hydrologist (Medical): No    Lack of Transportation (Non-Medical): No  Physical Activity: Inactive (07/01/2022)   Exercise Vital Sign    Days of Exercise per Week: 0 days    Minutes of Exercise per Session: 0 min  Stress: No Stress Concern Present (07/01/2022)   [place]    Feeling of Stress : Not at all  Social Connections: [place] (11/17/2022)   Social Connection and Isolation Panel [NHANES]    Frequency of Communication with Friends and Family: More than three times a week    Frequency of Social Gatherings with Friends and Family: More than three times a week    Attends Religious Services: More than 4 times per year    Active Member of [employer] or Organizations: Yes    Attends Archivist  Meetings: More than 4 times per year    Marital Status: Married  Human resources officer Violence: Not At Risk (11/17/2022)   Humiliation, Afraid, Rape, and Kick questionnaire    Fear of Current or Ex-Partner: No    Emotionally Abused: No    Physically Abused: No    Sexually Abused: No    Review of  Systems: ROS negative except for what is noted on the assessment and plan.  Objective:   Vitals:   02/06/23 1045 02/06/23 1112  BP: (!) 177/75 (!) 143/64  Pulse: 82 74  SpO2: 100%   Weight: 148 lb 14.4 oz (67.5 kg)   Height: 5' 1"$  (1.549 m)     Physical Exam: Constitutional: well-appearing  Cardiovascular: regular rate and rhythm, no m/r/g Pulmonary/Chest: normal work of breathing on room air, lungs clear to auscultation bilaterally MSK: No effusion or swelling present to third MCP for PIP bilaterally, tenderness to palpation present to third MCP and PIP bilaterally, full range of flexion and extension fingers, decreased grip strength left hand due to pain Neurological: alert & oriented x 3, 5/5 strength in bilateral upper and lower extremities, normal gait Skin: warm and dry   Assessment & Plan:  Essential hypertension Pressure elevated at 177/75. Repeat blood pressure at 123/64.  Current medications include olmesartan 73m and chlorthalidone 25 mg.  Her blood pressure medications have been titrated multiple times over the last few months.  It seems that she has difficulty tolerating her blood pressure medications.  With her history of CKD stage IIIb, blood pressure would be at goal at 120/80.  However it may be that patient is not able to tolerate systolic blood pressures over 2 months due to dizziness. A/P: Continue losartan and chlorthalidone Restart amlodipine 5 mg. Follow-up in 2 weeks to see if patient tolerated additional medication  Type II diabetes mellitus with complication (HCC) Repeat A1c is stable at 7.6 from 7.7.  Noted patient was hypoglycemic in clinic in the 60s, was  given glucose with repeat blood sugar in 120s.  She was able to place a CGM sensor by herself last week.  Her medications include Tresiba 11 units, lispro 2 at breakfast for lunch for dinner, metformin 500 mg daily, Rybelsus 7 g, Jardiance 10 mg.  She is getting samples for Rybelsus and Jardiance.  She has completed patient assistance paperwork for Rybelsus but we are waiting on medication to come to clinic.   Assessment: Review of CGM she is having several hypoglycemic episodes during the night into the 60s.  She is also having postprandial episodes consistently at lunch and dinner when she is using higher dosing of lispro. Plan: Increase Tresiba to 9 units Decrease lispro to 2 units with minor meals and 3 units with heavier meals, continue Rybelsus-given sample to take until patient assistance arrives, continue metformin 500 mg daily (difficulty tolerating this in the past at higher dosing), unable to get after Jardiance samples today I will send a message to [place] see about 1 additional sample will arrive. -Most tablets given and I talked with patient about keeping this with insulin pens in case she has low blood sugars while she is away from her house -Follow-up in 2 weeks  Need for shingles vaccine Order for shingles vaccine.  I talked with her about going to pharmacy to schedule appointment for this vaccination.   Patient discussed with Dr. MWalden FieldMasters, D.O. [facility]  PGY-2 Pager: [phone] Phone: [phone] 02/06/2023  Time 4:29 PM

## 2023-02-06 NOTE — Patient Instructions (Addendum)
Thank you, Amanda [name] for allowing [place] to provide your care today.   Diabetes A1c was 7.6 today.  Our goal is to have this less than 7.  From looking at your [facility] it looks like you are having low blood sugars in the middle of the night and after eating meals.  Goal is to not cause low blood sugars but to treat your high blood sugars.   1.  Decrease Tresiba from 11 units to 9 units 2.  Decrease Lyumjev to 3 units with larger meals and 2 units with lighter meals 3. Pick up glucose tabs to have with you in case your sugars dip while you are not at home. Keep those with the insulin pens 4. We do not have Jardiance samples currently, I will check with Rosendo Gros about when we will get more  Follow-up in 2 weeks.  Blood pressure Continue taking olmesartan 20 mg and chlorthalidone 25 mg.  Start taking amlodipine 5 mg.  Pain in hands From looking at your hands and your description of your symptoms I think you have osteoarthritis in your joints of your fingers.  Please try using Voltaren gel to see if this helps with the pain.  Please go to your pharmacy to get shingrex vaccine.  I have ordered the following labs for you:   Lab Orders         Glucose, capillary         POC Hbg A1C      I have ordered the following medication/changed the following medications:   Stop the following medications: There are no discontinued medications.   Start the following medications: Meds ordered this encounter  Medications   glucose 4 GM chewable tablet    Sig: Chew 1 tablet (4 g total) by mouth as needed for low blood sugar.    Dispense:  50 tablet    Refill:  12     Follow up:  2 weeks    We look forward to seeing you next time. Please call our clinic at [phone] if you have any questions or concerns. The best time to call is Monday-Friday from 9am-4pm, but there is someone available 24/7. If after hours or the weekend, call the main hospital number and ask for the Internal Medicine  Resident On-Call. If you need medication refills, please notify your pharmacy one week in advance and they will send [place] a request.   Thank you for trusting me with your care. Wishing you the best!   Christiana Fuchs, [place]

## 2023-02-06 NOTE — Assessment & Plan Note (Signed)
Repeat A1c is stable at 7.6 from 7.7.  Noted patient was hypoglycemic in clinic in the 60s, was given glucose with repeat blood sugar in 120s.  She was able to place a CGM sensor by herself last week.  Her medications include Tresiba 11 units, lispro 2 at breakfast for lunch for dinner, metformin 500 mg daily, Rybelsus 7 g, Jardiance 10 mg.  She is getting samples for Rybelsus and Jardiance.  She has completed patient assistance paperwork for Rybelsus but we are waiting on medication to come to clinic.   Assessment: Review of CGM she is having several hypoglycemic episodes during the night into the 60s.  She is also having postprandial episodes consistently at lunch and dinner when she is using higher dosing of lispro. Plan: Increase Tresiba to 9 units Decrease lispro to 2 units with minor meals and 3 units with heavier meals, continue Rybelsus-given sample to take until patient assistance arrives, continue metformin 500 mg daily (difficulty tolerating this in the past at higher dosing), unable to get after Jardiance samples today I will send a message to [place] to see about 1 additional sample will arrive. -Most tablets given and I talked with patient about keeping this with insulin pens in case she has low blood sugars while she is away from her house -Follow-up in 2 weeks

## 2023-02-06 NOTE — Assessment & Plan Note (Signed)
Pressure elevated at 177/75. Repeat blood pressure at 123/64.  Current medications include olmesartan 61m and chlorthalidone 25 mg.  Her blood pressure medications have been titrated multiple times over the last few months.  It seems that she has difficulty tolerating her blood pressure medications.  With her history of CKD stage IIIb, blood pressure would be at goal at 120/80.  However it may be that patient is not able to tolerate systolic blood pressures over 2 months due to dizziness. A/P: Continue losartan and chlorthalidone Restart amlodipine 5 mg. Follow-up in 2 weeks to see if patient tolerated additional medication

## 2023-02-06 NOTE — Assessment & Plan Note (Signed)
Order for shingles vaccine.  I talked with her about going to pharmacy to schedule appointment for this vaccination.

## 2023-02-08 ENCOUNTER — Telehealth: Payer: Self-pay | Admitting: *Deleted

## 2023-02-08 ENCOUNTER — Other Ambulatory Visit (HOSPITAL_COMMUNITY): Payer: Self-pay

## 2023-02-08 DIAGNOSIS — E1121 Type 2 diabetes mellitus with diabetic nephropathy: Secondary | ICD-10-CM

## 2023-02-08 NOTE — Telephone Encounter (Signed)
Patient called in stating that Tyler Aas is $70 at [facility] which she cannot afford. She is able to get lispro for $35. Call placed to [facility] at [facility]. States 3 mL is a 33 day supply and she thinks insurance may be charging 2 co-pays. There is no way to order 30 day supply as this is one pen. Morey Hummingbird advises patient to call her insurance company. Also, recommends looking for coupon on Tresiba website. Patient notified. She will call her insurance Co now.

## 2023-02-09 ENCOUNTER — Other Ambulatory Visit (HOSPITAL_COMMUNITY): Payer: Self-pay

## 2023-02-09 MED ORDER — TRESIBA FLEXTOUCH 100 UNIT/ML ~~LOC~~ SOPN
9.0000 [IU] | PEN_INJECTOR | Freq: Every day | SUBCUTANEOUS | 6 refills | Status: DC
  Filled 2023-04-26: qty 3, 30d supply, fill #0

## 2023-02-09 NOTE — Telephone Encounter (Signed)
Prescription for Amanda [name] updated for 3 mL refills.

## 2023-02-10 ENCOUNTER — Other Ambulatory Visit: Payer: Self-pay | Admitting: Internal Medicine

## 2023-02-10 ENCOUNTER — Other Ambulatory Visit (HOSPITAL_COMMUNITY): Payer: Self-pay

## 2023-02-10 NOTE — Progress Notes (Signed)
Samples of this drug were given to the patient, quantity 2pens, Lot Number [id number]  I called and talked with patient about samples. She will pick up on Monday.

## 2023-02-13 ENCOUNTER — Other Ambulatory Visit (HOSPITAL_COMMUNITY): Payer: Self-pay

## 2023-02-13 ENCOUNTER — Telehealth: Payer: Self-pay | Admitting: *Deleted

## 2023-02-13 MED ORDER — AMOXICILLIN 500 MG PO CAPS
500.0000 mg | ORAL_CAPSULE | Freq: Three times a day (TID) | ORAL | 0 refills | Status: DC
  Filled 2023-02-13: qty 18, 6d supply, fill #0

## 2023-02-13 MED ORDER — IBUPROFEN 800 MG PO TABS
800.0000 mg | ORAL_TABLET | Freq: Four times a day (QID) | ORAL | 0 refills | Status: DC
  Filled 2023-02-13: qty 18, 5d supply, fill #0

## 2023-02-13 MED ORDER — TRAMADOL HCL 50 MG PO TABS
50.0000 mg | ORAL_TABLET | Freq: Four times a day (QID) | ORAL | 0 refills | Status: DC
  Filled 2023-02-13: qty 10, 3d supply, fill #0

## 2023-02-13 NOTE — Telephone Encounter (Deleted)
Patient was given samples of Tresiba and not the Trulicity pr Dr. Alfonse Spruce.

## 2023-02-13 NOTE — Progress Notes (Signed)
Internal Medicine Clinic Attending  Case discussed with Dr. Howie Ill  at the time of the visit.  We reviewed the resident's history and exam and pertinent patient test results.  I agree with the assessment, diagnosis, and plan of care documented in the resident's note.

## 2023-02-13 NOTE — Telephone Encounter (Signed)
Patient here to pick up samples of Tresiba and not Trulicity as previously charted.  Given to patient by Dr. Alfonse Spruce.

## 2023-02-14 ENCOUNTER — Other Ambulatory Visit (HOSPITAL_COMMUNITY): Payer: Self-pay

## 2023-02-14 ENCOUNTER — Encounter: Payer: Self-pay | Admitting: Cardiovascular Disease

## 2023-02-14 ENCOUNTER — Ambulatory Visit: Payer: Medicare Other | Attending: Cardiovascular Disease | Admitting: Cardiovascular Disease

## 2023-02-14 VITALS — BP 140/70 | HR 88 | Ht 61.5 in | Wt 154.0 lb

## 2023-02-14 DIAGNOSIS — R072 Precordial pain: Secondary | ICD-10-CM | POA: Diagnosis not present

## 2023-02-14 DIAGNOSIS — E785 Hyperlipidemia, unspecified: Secondary | ICD-10-CM

## 2023-02-14 DIAGNOSIS — E118 Type 2 diabetes mellitus with unspecified complications: Secondary | ICD-10-CM

## 2023-02-14 DIAGNOSIS — R0600 Dyspnea, unspecified: Secondary | ICD-10-CM

## 2023-02-14 DIAGNOSIS — R0989 Other specified symptoms and signs involving the circulatory and respiratory systems: Secondary | ICD-10-CM

## 2023-02-14 DIAGNOSIS — N183 Chronic kidney disease, stage 3 unspecified: Secondary | ICD-10-CM

## 2023-02-14 DIAGNOSIS — I10 Essential (primary) hypertension: Secondary | ICD-10-CM

## 2023-02-14 NOTE — Patient Instructions (Addendum)
Medication Instructions:  Your physician recommends that you continue on your current medications as directed. Please refer to the Current Medication list given to you today.  *If you need a refill on your cardiac medications before your next appointment, please call your pharmacy*  Lab Work: Your physician recommends that you have lab work today- BNP and BMET  If you have labs (blood work) drawn today and your tests are completely normal, you will receive your results only by: MyChart Message (if you have [place]) OR A paper copy in the mail If you have any lab test that is abnormal or we need to change your treatment, we will call you to review the results.  Testing/Procedures: Your physician has requested that you have an echocardiogram. Echocardiography is a painless test that uses sound waves to create images of your heart. It provides your doctor with information about the size and shape of your heart and how well your heart's chambers and valves are working. This procedure takes approximately one hour. There are no restrictions for this procedure. Please do NOT wear cologne, perfume, aftershave, or lotions (deodorant is allowed). Please arrive 15 minutes prior to your appointment time.  Your physician has requested that you have en exercise stress myoview. For further information please visit [email or web address]. Please follow instruction sheet, as given.  Your physician has requested that you have a carotid duplex. This test is an ultrasound of the carotid arteries in your neck. It looks at blood flow through these arteries that supply the brain with blood. Allow one hour for this exam. There are no restrictions or special instructions.  Follow-Up: At [facility], you and your health needs are our priority.  As part of our continuing mission to provide you with exceptional heart care, we have created designated Provider Care Teams.  These Care Teams include your primary  Cardiologist (physician) and Advanced Practice Providers (APPs -  Physician Assistants and Nurse Practitioners) who all work together to provide you with the care you need, when you need it.  We recommend signing up for the patient portal called "MyChart".  Sign up information is provided on this After Visit Summary.  MyChart is used to connect with patients for Virtual Visits (Telemedicine).  Patients are able to view lab/test results, encounter notes, upcoming appointments, etc.  Non-urgent messages can be sent to your provider as well.   To learn more about what you can do with MyChart, go to [email or web address].    Your next appointment:   As needed  Provider:   Jenkins Rouge, MD

## 2023-02-14 NOTE — Telephone Encounter (Signed)
Call was transferred to me from triage nurse; patient states she accidentally took 9 units Humalog instead of her usual 2 units. Her blood sugar is currently 150 mg/dL and she has already drank orange juice in unknown quantity (" turned the bottle up and drank"). She was advised to check her blood sugar at least every 30 minutes/continuously(she is wearing a CGM) and keep a fast acting source of carbohydrate with her at all times. I also recommended she delay taking her long acting insulin and Tresiba until ~ 1-2 PM this afternoon when the 9 units of Humalog should have worn off/stopped lowering her blood sugar. . She verbalized understanding.

## 2023-02-15 ENCOUNTER — Other Ambulatory Visit (HOSPITAL_COMMUNITY): Payer: Self-pay

## 2023-02-15 ENCOUNTER — Ambulatory Visit
Admission: RE | Admit: 2023-02-15 | Discharge: 2023-02-15 | Disposition: A | Discharge status: Home / Self Care | Payer: Medicare Other | Source: Ambulatory Visit | Attending: Internal Medicine | Admitting: Internal Medicine

## 2023-02-15 DIAGNOSIS — Z1231 Encounter for screening mammogram for malignant neoplasm of breast: Secondary | ICD-10-CM

## 2023-02-15 LAB — BASIC METABOLIC PANEL
BUN/Creatinine Ratio: 19 (ref 12–28)
BUN: 36 mg/dL — ABNORMAL HIGH (ref 8–27)
CO2: 20 mmol/L (ref 20–29)
Calcium: 9.1 mg/dL (ref 8.7–10.3)
Chloride: 109 mmol/L — ABNORMAL HIGH (ref 96–106)
Creatinine, Ser: 1.91 mg/dL — ABNORMAL HIGH (ref 0.57–1.00)
Glucose: 45 mg/dL — ABNORMAL LOW (ref 70–99)
Potassium: 4.8 mmol/L (ref 3.5–5.2)
Sodium: 142 mmol/L (ref 134–144)
eGFR: 28 mL/min/{1.73_m2} — ABNORMAL LOW (ref >59)

## 2023-02-15 LAB — PRO B NATRIURETIC PEPTIDE: NT-Pro BNP: 177 pg/mL (ref 0–301)

## 2023-02-16 ENCOUNTER — Ambulatory Visit (HOSPITAL_COMMUNITY)
Admission: RE | Admit: 2023-02-16 | Discharge: 2023-02-16 | Disposition: A | Discharge status: Home / Self Care | Payer: Medicare Other | Source: Ambulatory Visit | Attending: Internal Medicine | Admitting: Internal Medicine

## 2023-02-16 ENCOUNTER — Other Ambulatory Visit: Payer: Self-pay

## 2023-02-16 DIAGNOSIS — R0600 Dyspnea, unspecified: Secondary | ICD-10-CM | POA: Diagnosis present

## 2023-02-16 DIAGNOSIS — N183 Chronic kidney disease, stage 3 unspecified: Secondary | ICD-10-CM | POA: Insufficient documentation

## 2023-02-16 DIAGNOSIS — R072 Precordial pain: Secondary | ICD-10-CM | POA: Diagnosis present

## 2023-02-16 DIAGNOSIS — E118 Type 2 diabetes mellitus with unspecified complications: Secondary | ICD-10-CM | POA: Diagnosis present

## 2023-02-16 DIAGNOSIS — I10 Essential (primary) hypertension: Secondary | ICD-10-CM | POA: Diagnosis present

## 2023-02-16 DIAGNOSIS — R0989 Other specified symptoms and signs involving the circulatory and respiratory systems: Secondary | ICD-10-CM | POA: Diagnosis present

## 2023-02-16 DIAGNOSIS — E785 Hyperlipidemia, unspecified: Secondary | ICD-10-CM | POA: Diagnosis present

## 2023-02-17 ENCOUNTER — Telehealth (HOSPITAL_COMMUNITY): Payer: Self-pay | Admitting: *Deleted

## 2023-02-17 ENCOUNTER — Telehealth: Payer: Self-pay | Admitting: Cardiovascular Disease

## 2023-02-17 ENCOUNTER — Other Ambulatory Visit (HOSPITAL_COMMUNITY): Payer: Self-pay

## 2023-02-17 DIAGNOSIS — R0989 Other specified symptoms and signs involving the circulatory and respiratory systems: Secondary | ICD-10-CM

## 2023-02-17 NOTE — Telephone Encounter (Signed)
Patient is returning phone call.  °

## 2023-02-17 NOTE — Telephone Encounter (Signed)
Patient given detailed instructions per Myocardial Perfusion Study Information Sheet for the test on 02/22/23 at 02/22/23. Patient notified to arrive 15 minutes early and that it is imperative to arrive on time for appointment to keep from having the test rescheduled.  If you need to cancel or reschedule your appointment, please call the office within 24 hours of your appointment. . Patient verbalized understanding.Amanda [name]

## 2023-02-17 NOTE — Telephone Encounter (Signed)
The patient has been notified of the result and verbalized understanding.  All questions (if any) were answered. Gershon Crane, LPN [id number] PM

## 2023-02-19 NOTE — Progress Notes (Unsigned)
CC: HTN follow up  HPI:  [name] is a 72 y.o. with medical history of HTN, HLD, DMII, CKD III presenting to [facility] for a follow up.   Please see problem-based list for further details, assessments, and plans.  Past Medical History:  Diagnosis Date   Allergy    SEASONAL   Arthritis    Cervical radiculopathy due to degenerative joint disease of spine 05/03/2017   CKD (chronic kidney disease) stage 3, GFR 30-59 ml/min ([place]) 01/01/2021   Diabetes mellitus 1996   Glaucoma    H. pylori infection    Hyperlipidemia    Hypertension    Normocytic anemia 02/14/2013   Osteopenia 05/04/2016   Pain of both shoulder joints 11/14/2016    Current Outpatient Medications (Endocrine & Metabolic):    empagliflozin (JARDIANCE) 10 MG TABS tablet, Take 1 tablet (10 mg total) by mouth daily before breakfast.   glucose 4 GM chewable tablet, Chew 1 tablet (4 g total) by mouth as needed for low blood sugar.   insulin degludec (TRESIBA FLEXTOUCH) 100 UNIT/ML FlexTouch Pen, Inject 9 Units into the skin daily.   Insulin Lispro-aabc (LYUMJEV KWIKPEN) 100 UNIT/ML KwikPen, Inject 2 Units into the skin daily with breakfast AND 3 Units daily with lunch AND 3 Units daily with supper.   metFORMIN (GLUCOPHAGE-XR) 500 MG 24 hr tablet, Take 1 tablet (500 mg total) by mouth daily with breakfast.   Semaglutide (RYBELSUS) 7 MG TABS, Take 1 tablet (7 mg) by mouth daily.  Current Outpatient Medications (Cardiovascular):    amLODipine (NORVASC) 5 MG tablet, Take 1 tablet (5 mg total) by mouth daily.   atorvastatin (LIPITOR) 40 MG tablet, Take 1 tablet (40 mg total) by mouth daily.   chlorthalidone (HYGROTON) 25 MG tablet, Take 1 tablet (25 mg total) by mouth daily.   olmesartan (BENICAR) 20 MG tablet, Take 1 tablet (20 mg total) by mouth daily.  Current Outpatient Medications (Respiratory):    fluticasone (FLONASE) 50 MCG/ACT nasal spray, Place 1 spray into both nostrils daily.  Current Outpatient Medications  (Analgesics):    acetaminophen (TYLENOL) 325 MG tablet, Take 2 tablets (650 mg total) by mouth every 6 (six) hours as needed (or Fever >/= 101).   traMADol (ULTRAM) 50 MG tablet, Take 1 tablet (50 mg total) by mouth every 6 (six) hours as needed for dental pain   Current Outpatient Medications (Other):    amoxicillin (AMOXIL) 500 MG capsule, Take 1 capsule (500 mg total) by mouth 3 (three) times daily.   blood glucose meter kit and supplies KIT, Dispense based on patient and insurance preference. Use up to four times daily as directed. .   Continuous Blood Gluc Receiver ([place]) DEVI, Use to check blood glucose before upon waking up, meals, at bedtime, and as needed,   Continuous Blood Gluc Sensor ([place]) MISC, Apply to back of arm as directed. Change every 10 days.   diclofenac Sodium (VOLTAREN) 1 % GEL, Apply 2 g topically daily as needed (hand pain).   glucose blood test strip, You do not medically need to check blood sugar while not taking insulin. You can check your blood sugar if you are concerned when you want, but do not need to   Insulin Pen Needle (PEN NEEDLES) 32G X 4 MM MISC, Use as directed.   latanoprost (XALATAN) 0.005 % ophthalmic solution, Place 1 drop into both eyes at bedtime.   Multiple Vitamins-Minerals (WOMENS 50+ MULTI VITAMIN/MIN) TABS, Take 1 tablet by  mouth daily.  Review of Systems:  Review of system negative unless stated in the problem list or HPI.    Physical Exam:  Vitals:   02/20/23 1023  BP: (!) 115/58  Pulse: 78  Temp: 98.3 F (36.8 C)  TempSrc: Oral  SpO2: 99%  Weight: 145 lb (65.8 kg)  Height: 5' 1.5" (1.562 m)    Physical Exam General: NAD HENT: NCAT Lungs: CTAB, no wheeze, rhonchi or rales.  Cardiovascular: Normal heart sounds, no r/m/g, 2+ pulses in all extremities. No LE edema Abdomen: No TTP, normal bowel sounds MSK: No asymmetry or muscle atrophy.  Skin: no lesions noted on exposed skin Neuro: Alert and oriented  x4. CN grossly intact Psych: Normal mood and normal affect   Assessment & Plan:   Essential hypertension Pt with HTN and on Amlodipine 5 mg, Chlorthalidone 25 mg qd, Olmesartan 20 mg qd. BMP well controlled on current regimen. Renal function with stable CKD IIIb/IV. Will continue current medications.   Lump in lower outer quadrant of right breast Pt with breast mass s/p lumpectomy. It was recommended by her surgeon that she may need annual MRI given her elevated risk. Pt was to follow up with surgery but was unaware. Advised pt to obtain follow up and she received an appointment in 03/2023. Recent MM within normal limits. Advised pt to check if pt needs MRI of breast vs routine MM of breast.  Type II diabetes mellitus with complication ([place]) Pt has DMII and has A1c of 7.6 one month ago. On Tresibha 9 units, and insulin lispro 2 units with meals. Metformin 500 mg qd, and Rybelsus 7 mg tablet qd, Jardiance 10 mg qd. Will discontinue metformin due to decrease in GFR and concern for lactic acidosis with its continuation. Plan is to follow up on rybelsus and see it we can increase it to 14 mg daily to get better glycemic control as needed to minimize her CKD progression.   CKD (chronic kidney disease), stage IV ([place]) Pt follows with [facility]. Advised her CKD is progressing and she will need optimal control of her HTN and DMII. She states she does not want to think about dialysis and she does not recall her nephrologist discussing this with her. CBC was checked that showed hgb 10.2 which is >10 so will check iron studies, retic count at next visit. If it becomes less than 10 and iron levels are optimized then we can consider ESA. Plan to check iron, retic count at follow up visit.   Exertional shortness of breath Pt getting worked up by cardiology for [place]. CBC obtained to rule out anemia as the cause and showed pt is anemic but not significantly to cause symptoms (CKD). Pt has further testing upcoming  including echo and stress testing which will be important to find the etiology of her DOE.     See Encounters Tab for problem based charting.  Patient Discussed with Dr. Odella Aquas, MD Tillie Rung. [facility] Internal Medicine Residency, PGY-2

## 2023-02-20 ENCOUNTER — Other Ambulatory Visit: Payer: Self-pay

## 2023-02-20 ENCOUNTER — Encounter: Payer: Self-pay | Admitting: Internal Medicine

## 2023-02-20 ENCOUNTER — Other Ambulatory Visit (HOSPITAL_COMMUNITY): Payer: Self-pay

## 2023-02-20 ENCOUNTER — Ambulatory Visit (INDEPENDENT_AMBULATORY_CARE_PROVIDER_SITE_OTHER): Payer: Medicare Other | Admitting: Internal Medicine

## 2023-02-20 VITALS — BP 115/58 | HR 78 | Temp 98.3°F | Ht 61.5 in | Wt 145.0 lb

## 2023-02-20 DIAGNOSIS — E1122 Type 2 diabetes mellitus with diabetic chronic kidney disease: Secondary | ICD-10-CM | POA: Diagnosis not present

## 2023-02-20 DIAGNOSIS — R0609 Other forms of dyspnea: Secondary | ICD-10-CM | POA: Diagnosis not present

## 2023-02-20 DIAGNOSIS — E118 Type 2 diabetes mellitus with unspecified complications: Secondary | ICD-10-CM

## 2023-02-20 DIAGNOSIS — I129 Hypertensive chronic kidney disease with stage 1 through stage 4 chronic kidney disease, or unspecified chronic kidney disease: Secondary | ICD-10-CM

## 2023-02-20 DIAGNOSIS — N6313 Unspecified lump in the right breast, lower outer quadrant: Secondary | ICD-10-CM

## 2023-02-20 DIAGNOSIS — I10 Essential (primary) hypertension: Secondary | ICD-10-CM

## 2023-02-20 DIAGNOSIS — R0602 Shortness of breath: Secondary | ICD-10-CM

## 2023-02-20 DIAGNOSIS — N184 Chronic kidney disease, stage 4 (severe): Secondary | ICD-10-CM

## 2023-02-20 NOTE — Patient Instructions (Addendum)
[name], it was a pleasure seeing you today! You endorsed feeling well today. Below are some of the things we talked about this visit. We look forward to seeing you in the follow up appointment!  Today we discussed: Please call your Surgeon to make your yearly appointment.  Amanda Francois, MD  [phone] (Work) [address] [place], [place] [address] He wanted to see you in one year given your previous lump. I would ask him if you need to get MRI of the breast or mammogram or both. He also discussed medical reduction which you wanted more information about.  For your BP, continue current meds. For your diabetes, please stop taking meformin as it can lead to problems with decreased kidney function.   I have ordered the following labs today:  Lab Orders  No laboratory test(s) ordered today      Referrals ordered today:   Referral Orders  No referral(s) requested today     I have ordered the following medication/changed the following medications:   Stop the following medications: Medications Discontinued During This Encounter  Medication Reason   ciclopirox (PENLAC) 8 % solution Patient has not taken in last 30 days   ibuprofen (ADVIL) 800 MG tablet Patient has not taken in last 30 days     Start the following medications: No orders of the defined types were placed in this encounter.    Follow-up: 1 month follow up Please make sure to arrive 15 minutes prior to your next appointment. If you arrive late, you may be asked to reschedule.   We look forward to seeing you next time. Please call our clinic at [phone] if you have any questions or concerns. The best time to call is Monday-Friday from 9am-4pm, but there is someone available 24/7. If after hours or the weekend, call the main hospital number and ask for the Internal Medicine Resident On-Call. If you need medication refills, please notify your pharmacy one week in advance and they will send  [place] a request.  Thank you for letting us take part in your care. Wishing you the best!  Thank you, Amanda Schuller, MD

## 2023-02-21 LAB — CBC
Hematocrit: 31.7 % — ABNORMAL LOW (ref 34.0–46.6)
Hemoglobin: 10.2 g/dL — ABNORMAL LOW (ref 11.1–15.9)
MCH: 29.8 pg (ref 26.6–33.0)
MCHC: 32.2 g/dL (ref 31.5–35.7)
MCV: 93 fL (ref 79–97)
Platelets: 343 10*3/uL (ref 150–450)
RBC: 3.42 x10E6/uL — ABNORMAL LOW (ref 3.77–5.28)
RDW: 12.2 % (ref 11.7–15.4)
WBC: 5.9 10*3/uL (ref 3.4–10.8)

## 2023-02-21 NOTE — Telephone Encounter (Signed)
Placed order for carotid.

## 2023-02-22 ENCOUNTER — Encounter (HOSPITAL_COMMUNITY): Payer: Medicare Other

## 2023-02-22 ENCOUNTER — Other Ambulatory Visit: Payer: Self-pay | Admitting: Internal Medicine

## 2023-02-22 ENCOUNTER — Other Ambulatory Visit (HOSPITAL_COMMUNITY): Payer: Self-pay

## 2023-02-22 DIAGNOSIS — E785 Hyperlipidemia, unspecified: Secondary | ICD-10-CM

## 2023-02-22 MED ORDER — ATORVASTATIN CALCIUM 40 MG PO TABS
40.0000 mg | ORAL_TABLET | Freq: Every day | ORAL | 2 refills | Status: DC
  Filled 2023-02-22: qty 90, 90d supply, fill #0
  Filled 2023-08-23: qty 90, 90d supply, fill #1
  Filled 2023-12-18: qty 90, 90d supply, fill #2

## 2023-02-22 NOTE — Assessment & Plan Note (Addendum)
Pt follows with [facility]. Advised her CKD is progressing and she will need optimal control of her HTN and DMII. She states she does not want to think about dialysis and she does not recall her nephrologist discussing this with her. CBC was checked that showed hgb 10.2 which is >10 so will check iron studies, retic count at next visit. If it becomes less than 10 and iron levels are optimized then we can consider ESA. Plan to check iron, retic count at follow up visit.

## 2023-02-22 NOTE — Assessment & Plan Note (Signed)
Pt with HTN and on Amlodipine 5 mg, Chlorthalidone 25 mg qd, Olmesartan 20 mg qd. BMP well controlled on current regimen. Renal function with stable CKD IIIb/IV. Will continue current medications.

## 2023-02-22 NOTE — Assessment & Plan Note (Addendum)
Pt getting worked up by cardiology for [place]. CBC obtained to rule out anemia as the cause and showed pt is anemic but not significantly to cause symptoms (CKD). Pt has further testing upcoming including echo and stress testing which will be important to find the etiology of her DOE.

## 2023-02-22 NOTE — Assessment & Plan Note (Signed)
Pt with breast mass s/p lumpectomy. It was recommended by her surgeon that she may need annual MRI given her elevated risk. Pt was to follow up with surgery but was unaware. Advised pt to obtain follow up and she received an appointment in 03/2023. Recent MM within normal limits. Advised pt to check if pt needs MRI of breast vs routine MM of breast.

## 2023-02-22 NOTE — Progress Notes (Incomplete)
CC: HTN follow up  HPI:  [name] is a 72 y.o. with medical history of HTN, HLD, DMII, CKD III presenting to [facility] for a follow up.   Please see problem-based list for further details, assessments, and plans.  Past Medical History:  Diagnosis Date  . Allergy    SEASONAL  . Arthritis   . Cervical radiculopathy due to degenerative joint disease of spine 05/03/2017  . CKD (chronic kidney disease) stage 3, GFR 30-59 ml/min (HCC) 01/01/2021  . Diabetes mellitus 1996  . Glaucoma   . H. pylori infection   . Hyperlipidemia   . Hypertension   . Normocytic anemia 02/14/2013  . Osteopenia 05/04/2016  . Pain of both shoulder joints 11/14/2016    Current Outpatient Medications (Endocrine & Metabolic):  .  empagliflozin (JARDIANCE) 10 MG TABS tablet, Take 1 tablet (10 mg total) by mouth daily before breakfast. .  glucose 4 GM chewable tablet, Chew 1 tablet (4 g total) by mouth as needed for low blood sugar. .  insulin degludec (TRESIBA FLEXTOUCH) 100 UNIT/ML FlexTouch Pen, Inject 9 Units into the skin daily. .  Insulin Lispro-aabc (LYUMJEV KWIKPEN) 100 UNIT/ML KwikPen, Inject 2 Units into the skin daily with breakfast AND 3 Units daily with lunch AND 3 Units daily with supper. .  metFORMIN (GLUCOPHAGE-XR) 500 MG 24 hr tablet, Take 1 tablet (500 mg total) by mouth daily with breakfast. .  Semaglutide (RYBELSUS) 7 MG TABS, Take 1 tablet (7 mg) by mouth daily.  Current Outpatient Medications (Cardiovascular):  .  amLODipine (NORVASC) 5 MG tablet, Take 1 tablet (5 mg total) by mouth daily. [name]  atorvastatin (LIPITOR) 40 MG tablet, Take 1 tablet (40 mg total) by mouth daily. .  chlorthalidone (HYGROTON) 25 MG tablet, Take 1 tablet (25 mg total) by mouth daily. [name]  olmesartan (BENICAR) 20 MG tablet, Take 1 tablet (20 mg total) by mouth daily.  Current Outpatient Medications (Respiratory):  .  fluticasone (FLONASE) 50 MCG/ACT nasal spray, Place 1 spray into both nostrils daily.  Current  Outpatient Medications (Analgesics):  .  acetaminophen (TYLENOL) 325 MG tablet, Take 2 tablets (650 mg total) by mouth every 6 (six) hours as needed (or Fever >/= 101). [name]  ibuprofen (ADVIL) 800 MG tablet, Take 1 tablet by mouth every six to eight hours as needed for pain .  traMADol (ULTRAM) 50 MG tablet, Take 1 tablet (50 mg total) by mouth every 6 (six) hours as needed for dental pain   Current Outpatient Medications (Other):  .  amoxicillin (AMOXIL) 500 MG capsule, Take 1 capsule (500 mg total) by mouth 3 (three) times daily. .  blood glucose meter kit and supplies KIT, Dispense based on patient and insurance preference. Use up to four times daily as directed. . .  ciclopirox (PENLAC) 8 % solution, Apply topically at bedtime. Apply over nail and surrounding skin. Apply daily over previous coat. After seven (7) days, may remove with alcohol and continue cycle. (Patient not taking: Reported on 02/14/2023) .  Continuous Blood Gluc Receiver ([place]) DEVI, Use to check blood glucose before upon waking up, meals, at bedtime, and as needed, .  Continuous Blood Gluc Sensor (DEXCOM G7 SENSOR) MISC, Apply to back of arm as directed. Change every 10 days. .  diclofenac Sodium (VOLTAREN) 1 % GEL, Apply 2 g topically daily as needed (hand pain). [name]  glucose blood test strip, You do not medically need to check blood sugar while not taking insulin. You can  check your blood sugar if you are concerned when you want, but do not need to .  Insulin Pen Needle (PEN NEEDLES) 32G X 4 MM MISC, Use as directed. .  latanoprost (XALATAN) 0.005 % ophthalmic solution, Place 1 drop into both eyes at bedtime. .  Multiple Vitamins-Minerals (WOMENS 50+ MULTI VITAMIN/MIN) TABS, Take 1 tablet by mouth daily.  Review of Systems:  Review of system negative unless stated in the problem list or HPI.    Physical Exam:  There were no vitals filed for this visit.  Physical Exam General: NAD HENT: NCAT Lungs: CTAB, no  wheeze, rhonchi or rales.  Cardiovascular: Normal heart sounds, no r/m/g, 2+ pulses in all extremities. No LE edema Abdomen: No TTP, normal bowel sounds MSK: No asymmetry or muscle atrophy.  Skin: no lesions noted on exposed skin Neuro: Alert and oriented x4. CN grossly intact Psych: Normal mood and normal affect   Assessment & Plan:   No problem-specific Assessment & Plan notes found for this encounter.   See Encounters Tab for problem based charting.  Patient Discussed with Dr. Odella Aquas, MD Tillie Rung. [facility] Internal Medicine Residency, PGY-2   HM  CKDIV Creatinine 1.9

## 2023-02-22 NOTE — Assessment & Plan Note (Signed)
>>  ASSESSMENT AND PLAN FOR CKD (CHRONIC KIDNEY DISEASE), STAGE IV (HCC) WRITTEN ON 02/22/2023  7:39 AM BY [name], MD  Pt follows with [facility]. Advised her CKD is progressing and she will need optimal control of her HTN and DMII. She states she does not want to think about dialysis and she does not recall her nephrologist discussing this with her. CBC was checked that showed hgb 10.2 which is >10 so will check iron studies, retic count at next visit. If it becomes less than 10 and iron levels are optimized then we can consider ESA. Plan to check iron, retic count at follow up visit.

## 2023-02-22 NOTE — Assessment & Plan Note (Addendum)
Pt has DMII and has A1c of 7.6 one month ago. On Tresibha 9 units, and insulin lispro 2 units with meals. Metformin 500 mg qd, and Rybelsus 7 mg tablet qd, Jardiance 10 mg qd. Will discontinue metformin due to decrease in GFR and concern for lactic acidosis with its continuation. Plan is to follow up on rybelsus and see it we can increase it to 14 mg daily to get better glycemic control as needed to minimize her CKD progression.

## 2023-02-23 ENCOUNTER — Other Ambulatory Visit (HOSPITAL_COMMUNITY): Payer: Self-pay

## 2023-02-23 ENCOUNTER — Ambulatory Visit (HOSPITAL_COMMUNITY): Payer: Medicare Other | Attending: Internal Medicine

## 2023-02-23 ENCOUNTER — Ambulatory Visit (HOSPITAL_BASED_OUTPATIENT_CLINIC_OR_DEPARTMENT_OTHER): Payer: Medicare Other

## 2023-02-23 DIAGNOSIS — R0600 Dyspnea, unspecified: Secondary | ICD-10-CM | POA: Diagnosis present

## 2023-02-23 DIAGNOSIS — R0609 Other forms of dyspnea: Secondary | ICD-10-CM

## 2023-02-23 DIAGNOSIS — R072 Precordial pain: Secondary | ICD-10-CM

## 2023-02-23 LAB — MYOCARDIAL PERFUSION IMAGING
LV dias vol: 54 mL (ref 46–106)
LV sys vol: 15 mL
Nuc Stress EF: 72 %
Peak HR: 97 {beats}/min
Rest HR: 82 {beats}/min
Rest Nuclear Isotope Dose: 10.8 mCi
SDS: 2
SRS: 2
SSS: 4
ST Depression (mm): 0 mm
Stress Nuclear Isotope Dose: 32.6 mCi
TID: 0.96

## 2023-02-23 LAB — ECHOCARDIOGRAM COMPLETE
Area-P 1/2: 3.71 cm2
S' Lateral: 2.7 cm

## 2023-02-23 MED ORDER — REGADENOSON 0.4 MG/5ML IV SOLN
0.4000 mg | Freq: Once | INTRAVENOUS | Status: AC
  Administered 2023-02-23: 0.4 mg via INTRAVENOUS

## 2023-02-23 MED ORDER — TECHNETIUM TC 99M TETROFOSMIN IV KIT
32.6000 | PACK | Freq: Once | INTRAVENOUS | Status: AC | PRN
  Administered 2023-02-23: 32.6 via INTRAVENOUS

## 2023-02-23 MED ORDER — TECHNETIUM TC 99M TETROFOSMIN IV KIT
10.8000 | PACK | Freq: Once | INTRAVENOUS | Status: AC | PRN
  Administered 2023-02-23: 10.8 via INTRAVENOUS

## 2023-02-27 ENCOUNTER — Telehealth: Payer: Self-pay | Admitting: *Deleted

## 2023-02-27 NOTE — Telephone Encounter (Signed)
Patient here to pick up samples of Jardiance.  Patient was given 1 bottle of Jardiance 10 mg tablets by Dr. Evette Doffing  [phone].

## 2023-03-01 NOTE — Progress Notes (Signed)
Internal Medicine Clinic Attending  Case discussed with Dr. Khan  at the time of the visit.  We reviewed the resident's history and exam and pertinent patient test results.  I agree with the assessment, diagnosis, and plan of care documented in the resident's note.  

## 2023-03-03 ENCOUNTER — Other Ambulatory Visit: Payer: Medicare Other

## 2023-03-07 ENCOUNTER — Ambulatory Visit (INDEPENDENT_AMBULATORY_CARE_PROVIDER_SITE_OTHER): Payer: Medicare Other | Admitting: Podiatry

## 2023-03-07 ENCOUNTER — Ambulatory Visit (INDEPENDENT_AMBULATORY_CARE_PROVIDER_SITE_OTHER): Payer: Medicare Other

## 2023-03-07 DIAGNOSIS — E1142 Type 2 diabetes mellitus with diabetic polyneuropathy: Secondary | ICD-10-CM | POA: Diagnosis not present

## 2023-03-07 DIAGNOSIS — M79675 Pain in left toe(s): Secondary | ICD-10-CM

## 2023-03-07 DIAGNOSIS — B351 Tinea unguium: Secondary | ICD-10-CM | POA: Diagnosis not present

## 2023-03-07 DIAGNOSIS — M79674 Pain in right toe(s): Secondary | ICD-10-CM | POA: Diagnosis not present

## 2023-03-07 DIAGNOSIS — M2012 Hallux valgus (acquired), left foot: Secondary | ICD-10-CM

## 2023-03-07 DIAGNOSIS — M2041 Other hammer toe(s) (acquired), right foot: Secondary | ICD-10-CM

## 2023-03-07 DIAGNOSIS — M2011 Hallux valgus (acquired), right foot: Secondary | ICD-10-CM

## 2023-03-07 NOTE — Progress Notes (Signed)
  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 Pain is relieved with periodic professional debridement.  New problem(s): None.   PCP is [name], Katie, DO , and last visit was  August 19, 2022.  Allergies  Allergen Reactions   Gabapentin Other (See Comments)    Pt states medication made her feel "out of it".   Pt states medication made her feel "out of it".      Review of Systems: Negative except as noted in the HPI.  Objective: No changes noted in today's physical examination. Anye R Siefring is a pleasant 72 y.o. female in NAD. AAO x 3.  Neurovascular Examination: Capillary refill time to digits immediate b/l. Palpable pedal pulses b/l LE. Pedal hair present. No pain with calf compression b/l. Lower extremity skin temperature gradient within normal limits. No edema noted b/l LE. No cyanosis or clubbing noted b/l LE.  Pt has subjective symptoms of neuropathy. Protective sensation intact 5/5 intact bilaterally with 10g monofilament b/l. Vibratory sensation intact b/l.  Dermatological:  Pedal integument with normal turgor, texture and tone BLE. No open wounds b/l LE. No interdigital macerations noted b/l LE. Toenail(s) 1-5 bilaterally elongated, discolored, dystrophic, thickened. Nails are crumbly with subungual debris and there is exquisite tenderness to dorsal palpation. No subungual wound(s) noted. No hyperkeratotic nor porokeratotic lesions present on today's visit.  Musculoskeletal:  Muscle strength 5/5 to all lower extremity muscle groups bilaterally. HAV with bunion deformity noted b/l LE. Hammertoe(s) noted to the R 5th toe.  Assessment/Plan: No diagnosis found. -Patient was evaluated and treated. All patient's and/or POA's  questions/concerns answered on today's visit. -Continue foot and shoe inspections daily. Monitor blood glucose per PCP/Endocrinologist's recommendations. -Toenails 1-5 b/l were debrided in length and girth with sterile nail nippers and dremel without iatrogenic bleeding.  -Patient/POA to call should there be question/concern in the interim.   No follow-ups on file.  Mackey Varricchio P Alfonsa Vaile, DPM  

## 2023-03-07 NOTE — Progress Notes (Signed)
Patient presents to the office today for diabetic shoe and insole measuring.  Patient was measured with brannock device to determine size and width for 1 pair of extra depth shoes and foam casted for 3 pair of insoles.   ABN signed.   Documentation of medical necessity will be sent to patient's treating diabetic doctor to verify and sign.   Patient's diabetic provider: KATIE [name], DO  Shoes and insoles will be ordered at that time and patient will be notified for an appointment for fitting when they arrive.    Patient shoe selection-   1st   Shoe choice:   [id number]  Shoe size ordered: 8 M

## 2023-03-13 ENCOUNTER — Other Ambulatory Visit: Payer: Self-pay

## 2023-03-13 NOTE — Progress Notes (Signed)
Patient outreached by Darrall Dears, [name] Candidate on 03/13/2023 to discuss hypertension.   Patient has an automated home blood pressure machine. She has not been taking her BP regularly, but plans to start taking it every day.   Medication review was performed. They are taking medications as prescribed.   The following barriers to adherence were noted:  - They does not have cost concerns. Patient did inquire about her insulin. Reports it costing around $35/month. This is not a big stretch for her, but she did ask if that is a typical price.  - They do not have transportation concerns.  - They do not need assistance obtaining refills.  - They do not occasionally forget to take some of their prescribed medications.  - They do not feel like one/some of their medications make them feel poorly.  - They do not have questions or concerns about their medications.  - They do have follow up scheduled with their primary care provider/cardiologist.   The following interventions were completed:  - Medications were reviewed  - Patient was educated on how to access home blood pressure machine  - Patient was counseled on lifestyle modifications to improve blood pressure, including diet and exercise.   The patient has follow up scheduled:  03/24/2023 with Dr. Donneta Romberg,  [name] Candidate   Joseph Art, Pharm.D. PGY-2 Ambulatory Care Pharmacy Resident

## 2023-03-15 ENCOUNTER — Other Ambulatory Visit (HOSPITAL_COMMUNITY): Payer: Self-pay

## 2023-03-24 ENCOUNTER — Encounter: Payer: Self-pay | Admitting: Student

## 2023-03-24 ENCOUNTER — Other Ambulatory Visit: Payer: Self-pay

## 2023-03-24 ENCOUNTER — Other Ambulatory Visit (HOSPITAL_COMMUNITY): Payer: Self-pay

## 2023-03-24 ENCOUNTER — Ambulatory Visit (INDEPENDENT_AMBULATORY_CARE_PROVIDER_SITE_OTHER): Payer: Medicare Other | Admitting: Student

## 2023-03-24 ENCOUNTER — Other Ambulatory Visit: Payer: Self-pay | Admitting: Internal Medicine

## 2023-03-24 VITALS — BP 119/56 | HR 75 | Temp 98.0°F | Ht 61.0 in | Wt 142.5 lb

## 2023-03-24 DIAGNOSIS — E118 Type 2 diabetes mellitus with unspecified complications: Secondary | ICD-10-CM

## 2023-03-24 DIAGNOSIS — I129 Hypertensive chronic kidney disease with stage 1 through stage 4 chronic kidney disease, or unspecified chronic kidney disease: Secondary | ICD-10-CM

## 2023-03-24 DIAGNOSIS — D509 Iron deficiency anemia, unspecified: Secondary | ICD-10-CM

## 2023-03-24 DIAGNOSIS — E1122 Type 2 diabetes mellitus with diabetic chronic kidney disease: Secondary | ICD-10-CM

## 2023-03-24 DIAGNOSIS — D631 Anemia in chronic kidney disease: Secondary | ICD-10-CM

## 2023-03-24 DIAGNOSIS — E1142 Type 2 diabetes mellitus with diabetic polyneuropathy: Secondary | ICD-10-CM

## 2023-03-24 DIAGNOSIS — N184 Chronic kidney disease, stage 4 (severe): Secondary | ICD-10-CM

## 2023-03-24 DIAGNOSIS — I10 Essential (primary) hypertension: Secondary | ICD-10-CM

## 2023-03-24 NOTE — Patient Instructions (Addendum)
It was a pleasure seeing you in clinic today  Please work on giving the meal time insulin right before eating and avoid giving it after meals to prevent low blood sugar Try to eat you meal immediately after giving insulin  We will check your blood levels today and I will call with results   You have an appointment on 4/15 at 10:50 to follow up with the breast surgery, Please ask how often they recommend MRI or mammograms for surveillance of breast cancer  Cornett, Sharl Ma, MD General Surgery NPI: [id number] [address] [place] [address]   Phone: [phone] Fax: [phone]  Follow up in 1 month

## 2023-03-25 LAB — IRON,TIBC AND FERRITIN PANEL
Ferritin: 62 ng/mL (ref 15–150)
Iron Saturation: 19 % (ref 15–55)
Iron: 53 ug/dL (ref 27–139)
Total Iron Binding Capacity: 277 ug/dL (ref 250–450)
UIBC: 224 ug/dL (ref 118–369)

## 2023-03-25 LAB — RETICULOCYTES: Retic Ct Pct: 1 % (ref 0.6–2.6)

## 2023-03-27 ENCOUNTER — Other Ambulatory Visit (HOSPITAL_COMMUNITY): Payer: Self-pay

## 2023-03-27 ENCOUNTER — Other Ambulatory Visit: Payer: Self-pay | Admitting: Student

## 2023-03-27 MED ORDER — ACCU-CHEK GUIDE VI STRP
ORAL_STRIP | 12 refills | Status: DC
  Filled 2023-03-27: qty 50, 50d supply, fill #0

## 2023-03-27 MED ORDER — FERROUS SULFATE 325 (65 FE) MG PO TABS
325.0000 mg | ORAL_TABLET | Freq: Every day | ORAL | 3 refills | Status: DC
  Filled 2023-03-27: qty 90, 90d supply, fill #0
  Filled 2023-08-04: qty 100, 100d supply, fill #1

## 2023-03-28 NOTE — Progress Notes (Signed)
Internal Medicine Clinic Attending ? ?Case discussed with Dr. Liang  At the time of the visit.  We reviewed the resident?s history and exam and pertinent patient test results.  I agree with the assessment, diagnosis, and plan of care documented in the resident?s note. ? ?

## 2023-03-28 NOTE — Assessment & Plan Note (Signed)
Patient reports hypoglycemia around 5-6 PM about 1 hour after meals. Glucose in the 60-70s. States she often give mealtime insulin after meal due to concern for hypoglycemia. Also notes she oftedn "grazes" for dinner and ears small amounts over the course to 30 minutes to and hour. Is taking tresiba 9 units, lispro 2 units with meals, metformin 500 mg daily, and rybelsus 7 mg daily. Discussed taking mealtime insulin after a meal can increase risk of hypoglycemia due to timing of glucose increase with meals. Discussed giving insulin just prior to meals and sitting down to eat a full meal at dinnertime to see if this improves her hypoglycemia. Could go down on her mealtime insulin at dinner if still having issues after making these changes.

## 2023-03-28 NOTE — Assessment & Plan Note (Signed)
BP well controlled, continue current medications

## 2023-03-28 NOTE — Progress Notes (Signed)
Established Patient Office Visit  Subjective   Patient ID: Amanda [name], female    DOB: 1951/07/07  Age: 72 y.o. MRN: [id number]  Chief Complaint  Patient presents with   Follow-up    FOLLOW  UP ON MEDICATION CHANGE / DM    Amanda [name] is a 72 y.o. person living with a history listed below who presents to clinic for diabetes follow up. Please refer to problem based charting for further details and assessment and plan of current problem and chronic medical conditions.     Patient Active Problem List   Diagnosis Date Noted   Exertional shortness of breath 12/29/2022   Bilateral hand pain 11/24/2022   Financial difficulties 09/02/2022   Diabetic nephropathy with proteinuria 05/18/2022   Iron deficiency anemia 12/02/2021   Peripheral artery disease 10/28/2021   Need for shingles vaccine 10/28/2021   Lump in lower outer quadrant of right breast 10/28/2021   CKD (chronic kidney disease), stage IV 01/01/2021   Osteopenia 05/04/2016   MDD (major depressive disorder), recurrent episode, moderate 02/08/2016   Pseudophakia of both eyes 07/27/2015   Preventive measure 06/03/2013   Diabetic neuropathy 05/20/2013   Seasonal allergies 03/12/2013   Dyslipidemia 02/15/2013   Type II diabetes mellitus with complication 02/14/2013   Essential hypertension 05/10/2010   ROS: negative as per HPI     Objective:     BP (!) 119/56 (BP Location: Left Arm, Patient Position: Sitting, Cuff Size: Normal)   Pulse 75   Temp 98 F (36.7 C) (Oral)   Ht 5\' 1"  (1.549 m)   Wt 142 lb 8 oz (64.6 kg)   SpO2 100%   BMI 26.93 kg/m  BP Readings from Last 3 Encounters:  03/24/23 (!) 119/56  02/20/23 (!) 115/58  02/14/23 (!) 140/70      Physical Exam Constitutional:      General: She is not in acute distress.    Appearance: Normal appearance. She is normal weight.  HENT:     Mouth/Throat:     Mouth: Mucous membranes are moist.     Pharynx: Oropharynx is clear.  Eyes:     Extraocular  Movements: Extraocular movements intact.     Conjunctiva/sclera: Conjunctivae normal.     Pupils: Pupils are equal, round, and reactive to light.  Cardiovascular:     Rate and Rhythm: Normal rate and regular rhythm.  Pulmonary:     Effort: Pulmonary effort is normal.     Breath sounds: No rhonchi or rales.  Abdominal:     General: Abdomen is flat. Bowel sounds are normal. There is no distension.     Palpations: Abdomen is soft.     Tenderness: There is no abdominal tenderness.  Musculoskeletal:        General: Normal range of motion.     Right lower leg: No edema.     Left lower leg: No edema.  Skin:    General: Skin is warm and dry.     Capillary Refill: Capillary refill takes less than 2 seconds.  Neurological:     General: No focal deficit present.     Mental Status: She is alert and oriented to person, place, and time.  Psychiatric:        Mood and Affect: Mood normal.        Behavior: Behavior normal.      Results for orders placed or performed in visit on 03/24/23  Iron, TIBC and Ferritin Panel  Result Value Ref Range  Total Iron Binding Capacity 277 250 - 450 ug/dL   UIBC 492 010 - 071 ug/dL   Iron 53 27 - 219 ug/dL   Iron Saturation 19 15 - 55 %   Ferritin 62 15 - 150 ng/mL  Reticulocytes Count  Result Value Ref Range   Retic Ct Pct 1.0 0.6 - 2.6 %       The 10-year ASCVD risk score (Arnett DK, et al., 2019) is: 19.6%    Assessment & Plan:   Problem List Items Addressed This Visit       Cardiovascular and Mediastinum   Essential hypertension (Chronic)    BP well controlled, continue current medications.         Endocrine   Type II diabetes mellitus with complication    Patient reports hypoglycemia around 5-6 PM about 1 hour after meals. Glucose in the 60-70s. States she often give mealtime insulin after meal due to concern for hypoglycemia. Also notes she oftedn "grazes" for dinner and ears small amounts over the course to 30 minutes to and hour.  Is taking tresiba 9 units, lispro 2 units with meals, metformin 500 mg daily, and rybelsus 7 mg daily. Discussed taking mealtime insulin after a meal can increase risk of hypoglycemia due to timing of glucose increase with meals. Discussed giving insulin just prior to meals and sitting down to eat a full meal at dinnertime to see if this improves her hypoglycemia. Could go down on her mealtime insulin at dinner if still having issues after making these changes.        Genitourinary   CKD (chronic kidney disease), stage IV - Primary     Other   Iron deficiency anemia    Hgb stable in lo 10s at last OV. Will check ferritin and iron panel.       Other Visit Diagnoses     Anemia in stage 4 chronic kidney disease       Relevant Orders   Iron, TIBC and Ferritin Panel (Completed)   Reticulocytes Count (Completed)       Return in about 4 weeks (around 04/21/2023).    Quincy Simmonds, MD

## 2023-03-28 NOTE — Assessment & Plan Note (Signed)
Hgb stable in lo 10s at last OV. Will check ferritin and iron panel.

## 2023-03-28 NOTE — Addendum Note (Signed)
Addended by: Burnell Blanks on: 03/28/2023 01:17 PM   Modules accepted: Level of Service

## 2023-03-29 ENCOUNTER — Other Ambulatory Visit: Payer: Self-pay | Admitting: Podiatry

## 2023-03-29 DIAGNOSIS — M2041 Other hammer toe(s) (acquired), right foot: Secondary | ICD-10-CM

## 2023-03-29 DIAGNOSIS — E1142 Type 2 diabetes mellitus with diabetic polyneuropathy: Secondary | ICD-10-CM

## 2023-04-18 ENCOUNTER — Other Ambulatory Visit (HOSPITAL_COMMUNITY): Payer: Self-pay

## 2023-04-21 ENCOUNTER — Encounter: Payer: Self-pay | Admitting: Internal Medicine

## 2023-04-21 ENCOUNTER — Ambulatory Visit (INDEPENDENT_AMBULATORY_CARE_PROVIDER_SITE_OTHER): Payer: Medicare Other | Admitting: Internal Medicine

## 2023-04-21 ENCOUNTER — Other Ambulatory Visit (HOSPITAL_COMMUNITY): Payer: Self-pay

## 2023-04-21 DIAGNOSIS — N184 Chronic kidney disease, stage 4 (severe): Secondary | ICD-10-CM | POA: Diagnosis not present

## 2023-04-21 NOTE — Patient Instructions (Addendum)
Dear Amanda [name],  Thank you for trusting [place] with your care.   Please continue to take your medications as prescribed.   Please make sure you take your meal time insulins before you eat. This is very important.   Use miralax to help with the constipation.   We will see you back in 1 month.

## 2023-04-21 NOTE — Progress Notes (Signed)
      N/A - please see attending note

## 2023-04-22 LAB — BMP8+ANION GAP
Anion Gap: 15 mmol/L (ref 10.0–18.0)
BUN/Creatinine Ratio: 23 (ref 12–28)
BUN: 43 mg/dL — ABNORMAL HIGH (ref 8–27)
CO2: 25 mmol/L (ref 20–29)
Calcium: 8.8 mg/dL (ref 8.7–10.3)
Chloride: 100 mmol/L (ref 96–106)
Creatinine, Ser: 1.84 mg/dL — ABNORMAL HIGH (ref 0.57–1.00)
Glucose: 215 mg/dL — ABNORMAL HIGH (ref 70–99)
Potassium: 4.6 mmol/L (ref 3.5–5.2)
Sodium: 140 mmol/L (ref 134–144)
eGFR: 29 mL/min/{1.73_m2} — ABNORMAL LOW (ref >59)

## 2023-04-24 ENCOUNTER — Telehealth: Payer: Self-pay

## 2023-04-24 ENCOUNTER — Other Ambulatory Visit (HOSPITAL_COMMUNITY): Payer: Self-pay

## 2023-04-24 NOTE — Telephone Encounter (Signed)
Thank you Dr.Williams I have called the patient and advised her regarding the previous message.

## 2023-04-24 NOTE — Telephone Encounter (Signed)
Patient called regarding her bp medications she wants to confirm which medications she is supposed to be taking. Patient stated Dr.Gawaluck told her to start back taking 1 of her bp meds but she doesn't remember which one. Please return patient call.

## 2023-04-25 ENCOUNTER — Telehealth: Payer: Self-pay | Admitting: Podiatry

## 2023-04-25 NOTE — Telephone Encounter (Signed)
Left message on voicemail returning call to patient , we have not received paperwork for her diabetic shoes , shoes can't be ordered without signed paperwork.

## 2023-04-26 ENCOUNTER — Telehealth: Payer: Self-pay

## 2023-04-26 ENCOUNTER — Other Ambulatory Visit (HOSPITAL_COMMUNITY): Payer: Self-pay

## 2023-04-26 NOTE — Telephone Encounter (Signed)
pt is requesting a call back about her meds jardiance and rybelsus she stated she was getting samples but now she is unsure where to get her meds

## 2023-04-26 NOTE — Telephone Encounter (Signed)
Returned patients phone call.  Informed pt her [facility] shipment of Rybelsus 7mg  are ready for pickup (4 boxes).  Also suggested patient reach back out to [facility] to see if her LIS/Extra Help application was denied being that she says she applied months ago. Informed patient she has [employer] but this doesn't cover prescriptions. I will supply her more Jardiance 10mg  samples as needed for now.   Pt will return call to me once she gets info from social security.

## 2023-04-27 ENCOUNTER — Other Ambulatory Visit (HOSPITAL_COMMUNITY): Payer: Self-pay

## 2023-04-27 NOTE — Telephone Encounter (Signed)
Pt picked up 4 boxes of Rybelsus 7 mg and 2 boxes of Jardiance 10 mg.

## 2023-04-28 ENCOUNTER — Other Ambulatory Visit (HOSPITAL_COMMUNITY): Payer: Self-pay

## 2023-05-02 NOTE — Progress Notes (Signed)
71yo woman with HTN, T2DM, and HLD here for follow up of her chronic medical conditions.   She has run out of her BP medicines, so her blood pressure of 180/96 today does not reflect her readings while on the medicines.   We will continue current medicine regimen, no changes made today.   She has had some constipation, and we counseled her to use miralax for this.  We will follow up in 1 month for HTN & T2DM.      Patient was seen by Dr. Burnice Logan and I discussed the care with him. He is unable to document the note.  This is a no charge encounter.

## 2023-05-04 ENCOUNTER — Telehealth: Payer: Self-pay

## 2023-05-04 NOTE — Telephone Encounter (Signed)
She has an appt 5/22 @ 1015AM with Dr Montez Morita.

## 2023-05-04 NOTE — Telephone Encounter (Signed)
Patient called she stated she is having foot pain, she wants to know what she can do to relieve her pain in the meantime, patient stated she has a appointment scheduled for next week.

## 2023-05-04 NOTE — Telephone Encounter (Addendum)
Return pt's call - stated c/o pain of left foot which started yesterday evening. She stated it feels like a muscle spasm;the pain only last a few minutes; it comes and goes. The pain starts on the outside and moves to mid bottom of her foot. She has been taking Tylenol which has not helped. It kept her awake last night. And it happens whether shoes on or off; walking or not. She also stated she knows she has neuropathy; she's unsure if it's related.

## 2023-05-10 ENCOUNTER — Encounter: Payer: Self-pay | Admitting: Student

## 2023-05-10 ENCOUNTER — Other Ambulatory Visit: Payer: Self-pay

## 2023-05-10 ENCOUNTER — Ambulatory Visit (INDEPENDENT_AMBULATORY_CARE_PROVIDER_SITE_OTHER): Payer: Medicare Other | Admitting: Student

## 2023-05-10 VITALS — BP 129/67 | HR 74 | Temp 98.2°F | Ht 61.0 in | Wt 146.2 lb

## 2023-05-10 DIAGNOSIS — I129 Hypertensive chronic kidney disease with stage 1 through stage 4 chronic kidney disease, or unspecified chronic kidney disease: Secondary | ICD-10-CM

## 2023-05-10 DIAGNOSIS — M25542 Pain in joints of left hand: Secondary | ICD-10-CM

## 2023-05-10 DIAGNOSIS — S86302A Unspecified injury of muscle(s) and tendon(s) of peroneal muscle group at lower leg level, left leg, initial encounter: Secondary | ICD-10-CM

## 2023-05-10 DIAGNOSIS — E1121 Type 2 diabetes mellitus with diabetic nephropathy: Secondary | ICD-10-CM

## 2023-05-10 DIAGNOSIS — N184 Chronic kidney disease, stage 4 (severe): Secondary | ICD-10-CM | POA: Diagnosis not present

## 2023-05-10 DIAGNOSIS — E1122 Type 2 diabetes mellitus with diabetic chronic kidney disease: Secondary | ICD-10-CM

## 2023-05-10 DIAGNOSIS — E118 Type 2 diabetes mellitus with unspecified complications: Secondary | ICD-10-CM

## 2023-05-10 DIAGNOSIS — M25541 Pain in joints of right hand: Secondary | ICD-10-CM

## 2023-05-10 DIAGNOSIS — I10 Essential (primary) hypertension: Secondary | ICD-10-CM

## 2023-05-10 LAB — POCT GLYCOSYLATED HEMOGLOBIN (HGB A1C): Hemoglobin A1C: 8.9 % — AB (ref 4.0–5.6)

## 2023-05-10 LAB — GLUCOSE, CAPILLARY: Glucose-Capillary: 133 mg/dL — ABNORMAL HIGH (ref 70–99)

## 2023-05-10 NOTE — Patient Instructions (Addendum)
We will increase your tresiba to 12u, if you have any low blood sugars (<70) please call our office.   Take a stool softener while you are on iron pills.   Please schedule an eye exam Please schedule an appointment with [facility].   For your ankle, please use ice, rest your foot when you can, You can also use topical voltaren gel for the pain. Finally you can use compression during the day (like a compression stocking).   For your fingers please use the voltaren gel as well. We will see you June 3rd.

## 2023-05-13 DIAGNOSIS — M25549 Pain in joints of unspecified hand: Secondary | ICD-10-CM | POA: Insufficient documentation

## 2023-05-13 DIAGNOSIS — S86309A Unspecified injury of muscle(s) and tendon(s) of peroneal muscle group at lower leg level, unspecified leg, initial encounter: Secondary | ICD-10-CM | POA: Insufficient documentation

## 2023-05-13 MED ORDER — EMPAGLIFLOZIN 25 MG PO TABS
25.0000 mg | ORAL_TABLET | Freq: Every day | ORAL | 2 refills | Status: DC
Start: 2023-05-13 — End: 2024-05-27
  Filled 2023-05-13 – 2023-11-21 (×2): qty 90, 90d supply, fill #0
  Filled 2024-02-28: qty 90, 90d supply, fill #1

## 2023-05-13 MED ORDER — TRESIBA FLEXTOUCH 100 UNIT/ML ~~LOC~~ SOPN
12.0000 [IU] | PEN_INJECTOR | Freq: Every day | SUBCUTANEOUS | 6 refills | Status: DC
Start: 2023-05-13 — End: 2023-07-05

## 2023-05-13 NOTE — Assessment & Plan Note (Signed)
>>  ASSESSMENT AND PLAN FOR CKD (CHRONIC KIDNEY DISEASE), STAGE IV (HCC) WRITTEN ON 05/13/2023  9:27 PM BY Marolyn Haller, MD  Patient notes that she will schedule follow up with [facility]. Recent BMP shows renal function is stable.

## 2023-05-13 NOTE — Assessment & Plan Note (Signed)
A1c 8.9 from 7.6 3 mo ago. Patient states that she has been had several family celebrations and consumed food where she did not control the ingredients. She suspects this is the reason for her worsening blood glucose control. She is currently on jardiance 10mg , rybelsus 7mg , tresiba 9u, and lispro 2u, 3u, 3u.   Her CGM data shows she has persistently elevated Bgs.   Plan:  Increase her 12u nightly and continue meal time insulin along with other orals. Will also increase jardiance to 25mg  qd. IM semaglutide not covered by insurance. Could see if tirzepatide covered during next clinic visit.   Addendum:  Discussed with patient on the phone that she will take TWO tabs of 7mg  rybelsus, She will pick up jardiance 25mg  samples 5/30. She will otherwise continue her current antidiabetic regimen. She will follow up 6/4 with her PCP to make further adjustments to her diabetes regimen.

## 2023-05-13 NOTE — Assessment & Plan Note (Signed)
Patient notes that she will schedule follow up with [facility]. Recent BMP shows renal function is stable.

## 2023-05-13 NOTE — Assessment & Plan Note (Signed)
Patient states that this has been ongoing for her for multiple years. She notices the pain after use. She denies trauma or changes of the overlying skin. She also denies any obvious deformities.   On exam: Interphalangeal Joints of bilateral hands and MCPs with no obvious deformity, fluid collection, erythema of overlying skin. Mildly TTP.   Discussed with patient that she likely has some osteoarthritis of her joints. This discomfort is not currently function limiting. Discussed that she could use voltaren gel for this discomfort as well.  We will see if this helps her symptoms at next clinic visit.

## 2023-05-13 NOTE — Progress Notes (Signed)
   CC: F/u of T2DM and for ankle and hand pain   HPI:  Amanda [name] is a 72 y.o. F with PMH per below who presents for follow up of her T2DM as well as ankle and hand pain. Please see problem based charting under encounters tab for further details.    Past Medical History:  Diagnosis Date   Allergy    SEASONAL   Arthritis    Cervical radiculopathy due to degenerative joint disease of spine 05/03/2017   CKD (chronic kidney disease) stage 3, GFR 30-59 ml/min (HCC) 01/01/2021   Diabetes mellitus 1996   Glaucoma    H. pylori infection    Hyperlipidemia    Hypertension    Normocytic anemia 02/14/2013   Osteopenia 05/04/2016   Pain of both shoulder joints 11/14/2016   Review of Systems:  Please see problem based charting under encounters tab for further details.    Physical Exam:  Vitals:   05/10/23 1032  BP: 129/67  Pulse: 74  Temp: 98.2 F (36.8 C)  TempSrc: Oral  SpO2: 100%  Weight: 146 lb 3.2 oz (66.3 kg)  Height: 5\' 1"  (1.549 m)    Constitutional: Well-developed, well-nourished, and in no distress.  HENT:  Head: Normocephalic and atraumatic.  Eyes: EOM are normal.  Neck: Normal range of motion.  Cardiovascular: Normal rate, regular rhythm, intact distal pulses. No gallop and no friction rub.  No murmur heard. No lower extremity edema  Pulmonary: Non labored breathing on room air, no wheezing or rales  Abdominal: Soft. Normal bowel sounds. Non distended and non tender Musculoskeletal: Normal range of motion.  TTP just anterior to L lateral malleolus. No erythema of overlying skin. No ecchymosis. Small Fluid collection in this region. Interphalangeal joints with no fluid collection, decreased mobility. No TTP. Neurological: Alert and oriented to person, place, and time. Non focal  Skin: Skin is warm and dry.    Assessment & Plan:   See Encounters Tab for problem based charting.  Patient discussed with Dr. Mayford Knife

## 2023-05-13 NOTE — Assessment & Plan Note (Addendum)
Will continue her olmesartan, chlorthalidone, and amlodipine.

## 2023-05-13 NOTE — Assessment & Plan Note (Signed)
Patient notes that for the past 6 days she has had sharp pain across the top of her foot. She does not recall any precipitating trauma. She states the pain occurs at rest, but is worsened with ambulation and standing. She denies any bruising or erythema to the overlying skin. She does not that there has been mild swelling in this area however.   On exam: Patient noted to have small fluid collection just anterior to the lateral malleolus. She also has TTP along peroneal tendon and peronal retinaculum. No erythema or ecchymosis.   Plan: Discussed with patient that she likely has a mild tendon injury. We discussed conservative management with rest, ice, compression and elevation. She will also use topical voltaren gel for this pain. Patient already has follow up scheduled 6/3.

## 2023-05-16 ENCOUNTER — Other Ambulatory Visit: Payer: Self-pay

## 2023-05-16 ENCOUNTER — Other Ambulatory Visit (HOSPITAL_COMMUNITY): Payer: Self-pay

## 2023-05-17 ENCOUNTER — Other Ambulatory Visit (HOSPITAL_COMMUNITY): Payer: Self-pay

## 2023-05-17 ENCOUNTER — Telehealth: Payer: Self-pay | Admitting: *Deleted

## 2023-05-17 ENCOUNTER — Other Ambulatory Visit: Payer: Self-pay | Admitting: Student

## 2023-05-17 DIAGNOSIS — Z794 Long term (current) use of insulin: Secondary | ICD-10-CM

## 2023-05-17 DIAGNOSIS — E118 Type 2 diabetes mellitus with unspecified complications: Secondary | ICD-10-CM

## 2023-05-17 MED ORDER — SEMAGLUTIDE 7 MG PO TABS
14.0000 mg | ORAL_TABLET | Freq: Every day | ORAL | 5 refills | Status: DC
Start: 2023-05-17 — End: 2023-05-17
  Filled 2023-05-17: qty 60, 30d supply, fill #0

## 2023-05-17 MED ORDER — RYBELSUS 14 MG PO TABS
14.0000 mg | ORAL_TABLET | Freq: Every day | ORAL | 2 refills | Status: DC
Start: 2023-05-17 — End: 2023-08-04
  Filled 2023-05-17 (×2): qty 30, 30d supply, fill #0

## 2023-05-17 NOTE — Progress Notes (Signed)
Discussed with patient to take two 7mg  tablets of her rybelsus.  She will pick up 25mg  sample of jardiance in the AM.   She has follow up scheduled 6/4 to see how are BG control is and whether her insulin should be decreased.

## 2023-05-17 NOTE — Telephone Encounter (Signed)
Pt receives Rybelsus from [facility], last shipment was picked up here at office at the beginning of May, for a 4 month supply. This medication ships to the office.

## 2023-05-17 NOTE — Telephone Encounter (Signed)
Amanda [name] with [facility] called in requesting Rx for Rybelsus be resent for 14 mg dose, take 1 tab daily as insurance will not pay for patient to take 2 tabs of 7 mg.

## 2023-05-18 ENCOUNTER — Other Ambulatory Visit (HOSPITAL_COMMUNITY): Payer: Self-pay

## 2023-05-18 ENCOUNTER — Telehealth: Payer: Self-pay | Admitting: *Deleted

## 2023-05-18 ENCOUNTER — Telehealth: Payer: Self-pay | Admitting: Dietician

## 2023-05-18 NOTE — Telephone Encounter (Signed)
Patient came to clinic for samples of Jardiance 25 mg. 4 boxes (28 tabs) handed to patient by Dr. Oswaldo Done with instructions to take 1 tab by mouth daily before breakfast. She is in the process of getting PAP with [facility].

## 2023-05-18 NOTE — Telephone Encounter (Signed)
Clarified jardiance and rybelsus doses and that I can help her with applying for Medicare Extra help at her appointment on Tuesday if needed.

## 2023-05-22 ENCOUNTER — Encounter: Payer: Medicare Other | Admitting: Student

## 2023-05-22 NOTE — Progress Notes (Signed)
Internal Medicine Clinic Attending  Case discussed with Dr. Carter  At the time of the visit.  We reviewed the resident's history and exam and pertinent patient test results.  I agree with the assessment, diagnosis, and plan of care documented in the resident's note.  

## 2023-05-23 ENCOUNTER — Ambulatory Visit: Payer: Medicare Other | Admitting: Dietician

## 2023-05-23 ENCOUNTER — Encounter: Payer: Self-pay | Admitting: Internal Medicine

## 2023-05-23 ENCOUNTER — Other Ambulatory Visit (HOSPITAL_COMMUNITY): Payer: Self-pay

## 2023-05-23 ENCOUNTER — Ambulatory Visit (INDEPENDENT_AMBULATORY_CARE_PROVIDER_SITE_OTHER): Payer: Medicare Other | Admitting: Internal Medicine

## 2023-05-23 VITALS — BP 129/79 | HR 79 | Temp 98.0°F | Wt 146.9 lb

## 2023-05-23 DIAGNOSIS — Z7984 Long term (current) use of oral hypoglycemic drugs: Secondary | ICD-10-CM

## 2023-05-23 DIAGNOSIS — I129 Hypertensive chronic kidney disease with stage 1 through stage 4 chronic kidney disease, or unspecified chronic kidney disease: Secondary | ICD-10-CM

## 2023-05-23 DIAGNOSIS — E1122 Type 2 diabetes mellitus with diabetic chronic kidney disease: Secondary | ICD-10-CM

## 2023-05-23 DIAGNOSIS — I10 Essential (primary) hypertension: Secondary | ICD-10-CM

## 2023-05-23 DIAGNOSIS — N1832 Chronic kidney disease, stage 3b: Secondary | ICD-10-CM | POA: Diagnosis not present

## 2023-05-23 DIAGNOSIS — E118 Type 2 diabetes mellitus with unspecified complications: Secondary | ICD-10-CM

## 2023-05-23 DIAGNOSIS — N183 Chronic kidney disease, stage 3 unspecified: Secondary | ICD-10-CM | POA: Insufficient documentation

## 2023-05-23 DIAGNOSIS — N184 Chronic kidney disease, stage 4 (severe): Secondary | ICD-10-CM

## 2023-05-23 DIAGNOSIS — Z794 Long term (current) use of insulin: Secondary | ICD-10-CM

## 2023-05-23 DIAGNOSIS — E1121 Type 2 diabetes mellitus with diabetic nephropathy: Secondary | ICD-10-CM

## 2023-05-23 MED ORDER — LYUMJEV KWIKPEN 100 UNIT/ML ~~LOC~~ SOPN
PEN_INJECTOR | SUBCUTANEOUS | 12 refills | Status: DC
Start: 2023-05-23 — End: 2023-07-05
  Filled 2023-05-23: qty 3, 30d supply, fill #0

## 2023-05-23 NOTE — Assessment & Plan Note (Addendum)
Patient is here for follow up on her type II DM. Her A1c once month ago was 8.9, increased from 7.6 1 month ago. CGM reveals she is in range 48% of the time, above goal 52% of the time, and low 0% of the time. She is adherent on her Jardiance 25 mg, Rybelsus 14 mg, Tresiba 12 units in the morning, and lispro 2u, 3u, 3u. She reports feeling hypoglycemic 1-2 times a week with alters from her CGM. Results from CGM one low around 70 several days ago. She typically eats 3 meals a day. CGM data reveals glucose levels above goal predominantly following lunch and dinner. Plan to increase her lispro from 3 to 4 units with lunch and dinner. She plans to follow up with her optometrist, Dr. Leveda Anna. Additionally, will obtain albumin/creatinine ratio today to assess  kidney function.  > continue Jardiance 25 mg > continue Rybelsus 14 mg > continue Tresiba 12 unit in the morning > continue 2 units lispro at breakfast, increase lunch and dinner lispro to 4 units > albumin/creatinine ratio.

## 2023-05-23 NOTE — Progress Notes (Signed)
Assisted patient with applying for Medicare extra help but she did not have all the information needed to complete it. She plans to get the information and reschedule her visit.

## 2023-05-23 NOTE — Progress Notes (Unsigned)
Subjective:   Patient ID: Amanda [name] female   DOB: [birth year] 72 y.o.   MRN: [id number]  HPI: Amanda [name] is a 72 y.o. female with a PMHx as listed below who presents for management of her type II DM and HTN. Please see problem based assessment and plan for further work up and management.   Patient Active Problem List   Diagnosis Date Noted   CKD (chronic kidney disease), stage III (HCC) 05/23/2023   Peroneal tendon injury 05/13/2023   Pain in joints of unspecified hand 05/13/2023   Exertional shortness of breath 12/29/2022   Bilateral hand pain 11/24/2022   Financial difficulties 09/02/2022   Diabetic nephropathy with proteinuria (HCC) 05/18/2022   Iron deficiency anemia 12/02/2021   Peripheral artery disease (HCC) 10/28/2021   Need for shingles vaccine 10/28/2021   Lump in lower outer quadrant of right breast 10/28/2021   CKD (chronic kidney disease), stage IV (HCC) 01/01/2021   Osteopenia 05/04/2016   MDD (major depressive disorder), recurrent episode, moderate (HCC) 02/08/2016   Pseudophakia of both eyes 07/27/2015   Preventive measure 06/03/2013   Diabetic neuropathy (HCC) 05/20/2013   Seasonal allergies 03/12/2013   Dyslipidemia 02/15/2013   Type II diabetes mellitus with complication (HCC) 02/14/2013   Essential hypertension 05/10/2010     Current Outpatient Medications  Medication Sig Dispense Refill   acetaminophen (TYLENOL) 325 MG tablet Take 2 tablets (650 mg total) by mouth every 6 (six) hours as needed (or Fever >/= 101). 60 tablet 0   amLODipine (NORVASC) 5 MG tablet Take 1 tablet (5 mg total) by mouth daily. 30 tablet 11   amoxicillin (AMOXIL) 500 MG capsule Take 1 capsule (500 mg total) by mouth 3 (three) times daily. 18 capsule 0   atorvastatin (LIPITOR) 40 MG tablet Take 1 tablet (40 mg total) by mouth daily. 90 tablet 2   blood glucose meter kit and supplies KIT Dispense based on patient and insurance preference. Use up to four times daily as  directed. . 1 each 0   chlorthalidone (HYGROTON) 25 MG tablet Take 1 tablet (25 mg total) by mouth daily. 90 tablet 3   Continuous Blood Gluc Receiver (DEXCOM G7 RECEIVER) DEVI Use to check blood glucose before upon waking up, meals, at bedtime, and as needed, 1 each 0   Continuous Glucose Sensor (DEXCOM G7 SENSOR) MISC Apply to back of arm as directed. Change every 10 days. 9 each 2   diclofenac Sodium (VOLTAREN) 1 % GEL Apply 2 g topically daily as needed (hand pain). 100 g 2   empagliflozin (JARDIANCE) 25 MG TABS tablet Take 1 tablet (25 mg total) by mouth daily before breakfast. 90 tablet 2   ferrous sulfate 325 (65 FE) MG tablet Take 1 tablet (325 mg total) by mouth daily. 90 tablet 3   fluticasone (FLONASE) 50 MCG/ACT nasal spray Place 1 spray into both nostrils daily. 16 g 2   glucose 4 GM chewable tablet Chew 1 tablet (4 g total) by mouth as needed for low blood sugar. 50 tablet 12   glucose blood (ACCU-CHEK GUIDE) test strip You do not medically need to check blood sugar while not taking insulin. You can check your blood sugar if you are concerned when you want, but do not need to. 100 each 12   insulin degludec (TRESIBA FLEXTOUCH) 100 UNIT/ML FlexTouch Pen Inject 12 Units into the skin daily. 3 mL 6   Insulin Lispro-aabc (LYUMJEV KWIKPEN) 100 UNIT/ML KwikPen Inject 2  Units into the skin daily with breakfast AND 4 Units daily with lunch AND 4 Units daily with supper. 3 mL 12   Insulin Pen Needle (PEN NEEDLES) 32G X 4 MM MISC Use as directed. 100 each 10   latanoprost (XALATAN) 0.005 % ophthalmic solution Place 1 drop into both eyes at bedtime.     Multiple Vitamins-Minerals (WOMENS 50+ MULTI VITAMIN/MIN) TABS Take 1 tablet by mouth daily. 30 tablet    olmesartan (BENICAR) 20 MG tablet Take 1 tablet (20 mg total) by mouth daily. 30 tablet 11   Semaglutide (RYBELSUS) 14 MG TABS Take 1 tablet (14 mg total) by mouth daily. 90 tablet 2   traMADol (ULTRAM) 50 MG tablet Take 1 tablet (50 mg total)  by mouth every 6 (six) hours as needed for dental pain 10 tablet 0   No current facility-administered medications for this visit.     Review of Systems: Pertinent ROS listed in the A&P. Otherwise negative.   Objective:   Physical Exam: Vitals:   05/23/23 1031  BP: 129/79  Pulse: 79  Temp: 98 F (36.7 C)  TempSrc: Oral  SpO2: 100%  Weight: 146 lb 14.4 oz (66.6 kg)  Physical Exam Constitutional:      Appearance: Normal appearance.  HENT:     Head: Normocephalic and atraumatic.  Cardiovascular:     Rate and Rhythm: Normal rate and regular rhythm.  Pulmonary:     Effort: Pulmonary effort is normal.     Breath sounds: Normal breath sounds.  Skin:    General: Skin is warm and dry.  Neurological:     General: No focal deficit present.     Mental Status: She is alert and oriented to person, place, and time.  Psychiatric:        Mood and Affect: Mood normal.        Behavior: Behavior normal.      Assessment & Plan:   Type II diabetes mellitus with complication [facility]) Patient is here for follow up on her type II DM. Her A1c once month ago was 8.9, increased from 7.6 1 month ago. CGM reveals she is in range 48% of the time, above goal 52% of the time, and low 0% of the time. She is adherent on her Jardiance 25 mg, Rybelsus 14 mg, Tresiba 12 units in the morning, and lispro 2u, 3u, 3u. She reports feeling hypoglycemic 1-2 times a week with alters from her CGM. Results from CGM one low around 70 several days ago. She typically eats 3 meals a day. CGM data reveals glucose levels above goal predominantly following lunch and dinner. Plan to increase her lispro from 3 to 4 units with lunch and dinner. She plans to follow up with her optometrist, Dr. Leveda Anna. Additionally, will obtain albumin/creatinine ratio today to assess  kidney function.  > continue Jardiance 25 mg > continue Rybelsus 14 mg > continue Tresiba 12 unit in the morning > continue 2 units lispro at breakfast, increase  lunch and dinner lispro to 4 units > albumin/creatinine ratio.    Essential hypertension Patient's blood pressure was in good range at 129/79 today. She checks her pressure at home with it normally in good range. She is adherent on her amlodipine 5 mg, chlorthalidone 25 mg, and olmesartan 20 mg. Will not make any changes today.   CKD (chronic kidney disease), stage III (HCC) Patient's last BMP 1 month ago was around her baseline with GFR 29. She is current on Jardiance 25 mg and  olmesartan 20 mg. She does follow up with nephrology and has upcoming appointment in August. Will continue current treatment regimen.  > Jardiance 25 mg > Olmesartan 20 mg

## 2023-05-23 NOTE — Assessment & Plan Note (Addendum)
Patient's last BMP 1 month ago was around her baseline with GFR 29. She is current on Jardiance 25 mg and olmesartan 20 mg. She does follow up with nephrology and has upcoming appointment in August. Will continue current treatment regimen.  > Jardiance 25 mg > Olmesartan 20 mg

## 2023-05-23 NOTE — Assessment & Plan Note (Addendum)
Patient's blood pressure was in good range at 129/79 today. She checks her pressure at home with it normally in good range. She is adherent on her amlodipine 5 mg, chlorthalidone 25 mg, and olmesartan 20 mg. Will not make any changes today.

## 2023-05-23 NOTE — Patient Instructions (Addendum)
Today we discussed the following:  1) Type II DM We have increased your lunch and dinner time lispro to 4 units. Please continue taking 2 units in the morning. Additionally, please continue taking Tresiba 12 units in the morning, Jardiance 25 mg daily, and Rybelsus 14 mg daily.   Please make and appointment to follow up with your eye Doctor, Dr. Leveda Anna.  2) High blood pressure  Your blood pressure was good today at 129/79. Please continue taking your amlodipine 5 mg, chlorthalidone 25 mg and Olmesartan 20 mg.  Please follow up in 1 month

## 2023-05-24 LAB — MICROALBUMIN / CREATININE URINE RATIO
Creatinine, Urine: 73.7 mg/dL
Microalb/Creat Ratio: 80 mg/g creat — ABNORMAL HIGH (ref 0–29)
Microalbumin, Urine: 58.6 ug/mL

## 2023-05-24 NOTE — Progress Notes (Signed)
Internal Medicine Clinic Attending  Case discussed with Dr. [name]  At the time of the visit.  We reviewed the resident's history and exam and pertinent patient test results.  I agree with the assessment, diagnosis, and plan of care documented in the resident's note.  

## 2023-05-26 ENCOUNTER — Other Ambulatory Visit (HOSPITAL_COMMUNITY): Payer: Self-pay

## 2023-05-29 ENCOUNTER — Other Ambulatory Visit (HOSPITAL_COMMUNITY): Payer: Self-pay

## 2023-05-31 ENCOUNTER — Other Ambulatory Visit (HOSPITAL_COMMUNITY): Payer: Self-pay

## 2023-05-31 ENCOUNTER — Other Ambulatory Visit: Payer: Self-pay | Admitting: Internal Medicine

## 2023-05-31 DIAGNOSIS — E1121 Type 2 diabetes mellitus with diabetic nephropathy: Secondary | ICD-10-CM

## 2023-05-31 MED ORDER — TRESIBA FLEXTOUCH 100 UNIT/ML ~~LOC~~ SOPN
12.0000 [IU] | PEN_INJECTOR | Freq: Every day | SUBCUTANEOUS | 6 refills | Status: DC
Start: 2023-05-31 — End: 2023-07-05
  Filled 2023-05-31: qty 3, 25d supply, fill #0
  Filled 2023-07-03: qty 3, 25d supply, fill #1

## 2023-06-01 ENCOUNTER — Other Ambulatory Visit (HOSPITAL_COMMUNITY): Payer: Self-pay

## 2023-06-01 ENCOUNTER — Other Ambulatory Visit: Payer: Self-pay

## 2023-06-01 MED ORDER — LATANOPROST 0.005 % OP SOLN
1.0000 [drp] | Freq: Every day | OPHTHALMIC | 3 refills | Status: DC
  Filled 2023-06-01: qty 2.5, 25d supply, fill #0
  Filled 2023-07-26: qty 2.5, 25d supply, fill #1
  Filled 2023-09-21: qty 2.5, 25d supply, fill #2
  Filled 2023-10-25: qty 2.5, 25d supply, fill #3

## 2023-06-05 ENCOUNTER — Encounter: Payer: Self-pay | Admitting: Dietician

## 2023-06-06 ENCOUNTER — Encounter: Payer: Self-pay | Admitting: Dietician

## 2023-06-06 ENCOUNTER — Ambulatory Visit: Payer: Medicare Other | Admitting: Podiatry

## 2023-06-06 VITALS — BP 138/68 | HR 75

## 2023-06-06 DIAGNOSIS — M79675 Pain in left toe(s): Secondary | ICD-10-CM | POA: Diagnosis not present

## 2023-06-06 DIAGNOSIS — M79674 Pain in right toe(s): Secondary | ICD-10-CM | POA: Diagnosis not present

## 2023-06-06 DIAGNOSIS — B351 Tinea unguium: Secondary | ICD-10-CM

## 2023-06-06 DIAGNOSIS — L84 Corns and callosities: Secondary | ICD-10-CM | POA: Diagnosis not present

## 2023-06-06 DIAGNOSIS — E1142 Type 2 diabetes mellitus with diabetic polyneuropathy: Secondary | ICD-10-CM

## 2023-06-08 ENCOUNTER — Encounter: Payer: Self-pay | Admitting: Dietician

## 2023-06-09 ENCOUNTER — Other Ambulatory Visit (HOSPITAL_COMMUNITY): Payer: Self-pay

## 2023-06-11 ENCOUNTER — Encounter: Payer: Self-pay | Admitting: Podiatry

## 2023-06-11 NOTE — Progress Notes (Signed)
  Subjective:  Patient ID: Amanda [name], female    DOB: 08-03-1951,  MRN: [id number]  Amanda [name] presents to clinic today for: corn(s) both feet and painful thick toenails that are difficult to trim. Painful toenails interfere with ambulation. Aggravating factors include wearing enclosed shoe gear. Pain is relieved with periodic professional debridement. Painful corns are aggravated when weightbearing when wearing enclosed shoe gear. Pain is relieved with periodic professional debridement.  Chief Complaint  Patient presents with   Diabetes    [facility] BS - 122 A1C - 8.2 LVPCP - 05/2023   Callouses    CORN 5TH BILAT    PCP is [name], Odon, DO.  Allergies  Allergen Reactions   Gabapentin Other (See Comments)    Pt states medication made her feel "out of it".   Pt states medication made her feel "out of it".      Review of Systems: Negative except as noted in the HPI.  Objective: No changes noted in today's physical examination. Vitals:   06/06/23 0918  BP: 138/68  Pulse: 75    Amanda [name] is a pleasant 72 y.o. female in NAD. AAO x 3.  Vascular Examination: Capillary refill time <3 seconds b/l LE. Palpable pedal pulses b/l LE. Digital hair present b/l. No pedal edema b/l. Skin temperature gradient WNL b/l. No varicosities b/l. No cyanosis or clubbing noted b/l LE.[name]  Dermatological Examination: Pedal skin with normal turgor, texture and tone b/l. No open wounds. No interdigital macerations b/l. Toenails 1-5 b/l thickened, discolored, dystrophic with subungual debris. There is pain on palpation to dorsal aspect of nailplates. Hyperkeratotic lesion(s) right fifth digit.  No erythema, no edema, no drainage, no fluctuance..  Neurological Examination: Protective sensation intact with 10 gram monofilament b/l LE. Vibratory sensation intact b/l LE. Pt has subjective symptoms of neuropathy.  Musculoskeletal Examination: Normal muscle strength 5/5 to all lower extremity muscle  groups bilaterally. HAV with bunion bilaterally and hammertoes 2-5 b/l.[name] No pain, crepitus or joint limitation noted with ROM b/l LE.  Patient ambulates independently without assistive aids.     Latest Ref Rng & Units 05/10/2023   10:46 AM 02/06/2023   10:53 AM 11/17/2022    3:20 PM 08/19/2022   10:27 AM  Hemoglobin A1C  Hemoglobin-A1c 4.0 - 5.6 % 8.9  7.6  7.7  10.9    Assessment/Plan: 1. Pain due to onychomycosis of toenails of both feet   2. Corns   3. Diabetic peripheral neuropathy associated with type 2 diabetes mellitus (HCC)    -Patient was evaluated and treated. All patient's and/or POA's questions/concerns answered on today's visit. -Continue foot and shoe inspections daily. Monitor blood glucose per PCP/Endocrinologist's recommendations. -Continue supportive shoe gear daily. -Toenails 1-5 b/l were debrided in length and girth with sterile nail nippers and dremel without iatrogenic bleeding.  -Corn(s) right fifth digit pared utilizing sharp debridement with sterile blade without complication or incident. Total number debrided=1. -Patient/POA to call should there be question/concern in the interim.   Return in about 3 months (around 09/06/2023).  Freddie Breech, DPM

## 2023-06-14 ENCOUNTER — Ambulatory Visit: Payer: Medicare Other | Admitting: Dietician

## 2023-06-14 NOTE — Progress Notes (Signed)
Assisted patient in completing her application for Medicare extra help per her request.  Norm Parcel, RD 06/14/2023 10:01 AM.

## 2023-06-20 ENCOUNTER — Other Ambulatory Visit (HOSPITAL_COMMUNITY): Payer: Self-pay

## 2023-06-23 ENCOUNTER — Encounter: Payer: Medicare Other | Admitting: Student

## 2023-06-27 ENCOUNTER — Telehealth: Payer: Self-pay | Admitting: Podiatry

## 2023-06-27 NOTE — Telephone Encounter (Signed)
Calling to get an update on shoes if they are in or what is going on with them.

## 2023-06-30 NOTE — Telephone Encounter (Signed)
I sent her a message in Mychart.

## 2023-07-03 ENCOUNTER — Other Ambulatory Visit (HOSPITAL_COMMUNITY): Payer: Self-pay

## 2023-07-05 ENCOUNTER — Encounter: Payer: Self-pay | Admitting: Internal Medicine

## 2023-07-05 ENCOUNTER — Ambulatory Visit (INDEPENDENT_AMBULATORY_CARE_PROVIDER_SITE_OTHER): Payer: Medicare Other | Admitting: Internal Medicine

## 2023-07-05 ENCOUNTER — Other Ambulatory Visit: Payer: Self-pay

## 2023-07-05 ENCOUNTER — Other Ambulatory Visit (HOSPITAL_COMMUNITY): Payer: Self-pay

## 2023-07-05 ENCOUNTER — Telehealth: Payer: Self-pay

## 2023-07-05 ENCOUNTER — Ambulatory Visit (INDEPENDENT_AMBULATORY_CARE_PROVIDER_SITE_OTHER): Payer: Medicare Other

## 2023-07-05 ENCOUNTER — Telehealth: Payer: Self-pay | Admitting: Dietician

## 2023-07-05 VITALS — BP 134/72 | HR 84 | Temp 98.3°F | Ht 61.0 in | Wt 145.7 lb

## 2023-07-05 DIAGNOSIS — Z0000 Encounter for general adult medical examination without abnormal findings: Secondary | ICD-10-CM | POA: Diagnosis not present

## 2023-07-05 DIAGNOSIS — R809 Proteinuria, unspecified: Secondary | ICD-10-CM

## 2023-07-05 DIAGNOSIS — I10 Essential (primary) hypertension: Secondary | ICD-10-CM

## 2023-07-05 DIAGNOSIS — E118 Type 2 diabetes mellitus with unspecified complications: Secondary | ICD-10-CM

## 2023-07-05 DIAGNOSIS — Z794 Long term (current) use of insulin: Secondary | ICD-10-CM

## 2023-07-05 DIAGNOSIS — E1121 Type 2 diabetes mellitus with diabetic nephropathy: Secondary | ICD-10-CM

## 2023-07-05 LAB — POCT GLYCOSYLATED HEMOGLOBIN (HGB A1C): Hemoglobin A1C: 8.9 % — AB (ref 4.0–5.6)

## 2023-07-05 LAB — GLUCOSE, CAPILLARY: Glucose-Capillary: 110 mg/dL — ABNORMAL HIGH (ref 70–99)

## 2023-07-05 MED ORDER — LYUMJEV KWIKPEN 100 UNIT/ML ~~LOC~~ SOPN
PEN_INJECTOR | SUBCUTANEOUS | 12 refills | Status: DC
Start: 2023-07-05 — End: 2024-02-13
  Filled 2023-07-05: qty 3, 30d supply, fill #0
  Filled 2023-08-09: qty 3, 28d supply, fill #0
  Filled 2023-09-07: qty 3, 28d supply, fill #1
  Filled 2023-10-11: qty 3, 28d supply, fill #2
  Filled 2024-01-22 (×2): qty 3, 28d supply, fill #3

## 2023-07-05 MED ORDER — TRESIBA FLEXTOUCH 100 UNIT/ML ~~LOC~~ SOPN
8.0000 [IU] | PEN_INJECTOR | Freq: Every day | SUBCUTANEOUS | Status: DC
Start: 2023-07-05 — End: 2023-08-04

## 2023-07-05 MED ORDER — LYUMJEV KWIKPEN 100 UNIT/ML ~~LOC~~ SOPN
PEN_INJECTOR | SUBCUTANEOUS | 12 refills | Status: DC
Start: 2023-07-05 — End: 2023-07-05
  Filled 2023-07-05: qty 3, 25d supply, fill #0

## 2023-07-05 NOTE — Assessment & Plan Note (Signed)
>>  ASSESSMENT AND PLAN FOR MICROALBUMINURIA WRITTEN ON 07/05/2023  1:32 PM BY [name], KATIE, DO  She follows with nephrology and has an appointment next month. She is adherent with jardiance, rybelsus and olmesartan.

## 2023-07-05 NOTE — Telephone Encounter (Signed)
Appointment made for 07/24/23 at 1:15 PM per Dr. Sloan Leiter. She said she was not planning to take 4 or 5 units but to continue with hr 2 and maybe 3 u nits before meals.  She thinks her problem is eating away from home and not taking her insulin with her, so she made a goal for the next 2 weeks of taking her meal time insulin with her when she eats out. We discussed recording her mealtime insulin doses in her Dexcom receiver. If she cannot figure out how to do that prior to our visit, will show her at her visit.

## 2023-07-05 NOTE — Patient Instructions (Signed)
Thank you, Ms.Dealva R Arseneau for allowing [place] to provide your care today.   Diabetes Your A1c was 8.9 today. I am adjusting your insulin today to see if we can cover for lunch and dinner. Your morning blood sugars have been low so I am decreasing tresiba.  Lispro- 2 units with breakfast, 5 units with lunch and dinenr Tresiba- 8 units nightly  I placed referral for eye doctor for yearly exam.  I will check with camille about next step with Jardiance. You were given samples today.   Rybelsus you will get refills from clinic when you run out. Please call when that happens.   I have ordered the following labs for you:   Lab Orders         Glucose, capillary         POC Hbg A1C       Referrals ordered today:    Referral Orders         Ambulatory referral to Ophthalmology      I have ordered the following medication/changed the following medications:   Stop the following medications: Medications Discontinued During This Encounter  Medication Reason   amoxicillin (AMOXIL) 500 MG capsule    insulin degludec (TRESIBA FLEXTOUCH) 100 UNIT/ML FlexTouch Pen    insulin degludec (TRESIBA FLEXTOUCH) 100 UNIT/ML FlexTouch Pen Reorder   Insulin Lispro-aabc (LYUMJEV KWIKPEN) 100 UNIT/ML KwikPen      Start the following medications: Meds ordered this encounter  Medications   insulin degludec (TRESIBA FLEXTOUCH) 100 UNIT/ML FlexTouch Pen    Sig: Inject 8 Units into the skin daily.   Insulin Lispro-aabc (LYUMJEV KWIKPEN) 100 UNIT/ML KwikPen    Sig: Inject 2 Units into the skin daily with breakfast AND 5 Units daily with lunch AND 5 Units daily with supper.    Dispense:  3 mL    Refill:  12     Follow up: 1 month   We look forward to seeing you next time. Please call our clinic at [phone] if you have any questions or concerns. The best time to call is Monday-Friday from 9am-4pm, but there is someone available 24/7. If after hours or the weekend, call the main hospital number and  ask for the Internal Medicine Resident On-Call. If you need medication refills, please notify your pharmacy one week in advance and they will send [place] a request.   Thank you for trusting me with your care. Wishing you the best!   Rudene Christians, DO [facility]

## 2023-07-05 NOTE — Assessment & Plan Note (Addendum)
CGM reviewed and active >93% of time. She has lows <1% of time, she continues to have elevated blood sugars following lunch and dinner. AM sugar normal with occasional low. Medications include Degludec 12 units every day, lispro 2, 4,4 units, Rybelsus 14 mg (getting through [employer] last picked up 5/24 for 4 months), Jardiance 25 mg (clinic samples, denied for Medicare extra help). A1c is unchanged from May at 8.9 P: Decrease degludec from 12 to 8 units On additional questioning it sounds like she is administering 3-4 units with lunch and dinner depending on what she is eating. Will not increase to 5 at this time and I talked with her about trying 4 units more often. Will plan for her to follow-up with Lupita Leash in 2 weeks to see how increasing meal time insulin is going. She is worried about having hypoglycemic episodes.  -message sent to [facility] to see if other option for jardiance since she was denied with Medicaid -referral for ophthalmology placed   Drug name: Jardiance       Strength: 25 mg         Qty: 28  LOT: [id number]  Exp.Date: Feb 2026  Dosing instructions: take one tab daily  The patient has been instructed regarding the correct time, dose, and frequency of taking this medication, including desired effects and most common side effects.

## 2023-07-05 NOTE — Assessment & Plan Note (Signed)
She follows with nephrology and has an appointment next month. She is adherent with jardiance, rybelsus and olmesartan.

## 2023-07-05 NOTE — Telephone Encounter (Signed)
Spoke with patient regarding LIS denial letter.  Pt rec'd denial letter from [facility] and will bring by the office asap. Pt will also sign the [employer] application for Jardiance. This will be placed with her Rybelsus shipment from [facility]. Pt aware to sign and return to front desk along with LIS denial letter.

## 2023-07-05 NOTE — Addendum Note (Signed)
Addended by: Lucille Passy on: 07/05/2023 01:06 PM   Modules accepted: Level of Service

## 2023-07-05 NOTE — Assessment & Plan Note (Addendum)
Home blood pressure well controlled with systolic in 110-120/ 80s. She is adherent with amlodipine 5 mg, chlorthalidone 25 mg, and olmesartan 20 mg. 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 She follows with nephrology. -continue current medications

## 2023-07-05 NOTE — Progress Notes (Signed)
Subjective:   Amanda [name] is a 72 y.o. female who presents for Medicare Annual (Subsequent) preventive examination.  Visit Complete: In person  Patient Medicare AWV questionnaire was completed by the patient on 07/05/2023; I have confirmed that all information answered by patient is correct and no changes since this date.  Review of Systems    Defer to PCP       Objective:    Today's Vitals   07/05/23 1110 07/05/23 1111  BP: 134/72   Pulse: 84   Temp: 98.3 F (36.8 C)   TempSrc: Oral   SpO2: 100%   Weight: 145 lb 11.2 oz (66.1 kg)   Height: 5\' 1"  (1.549 m)   PainSc:  7    Body mass index is 27.53 kg/m.     07/05/2023   11:12 AM 07/05/2023    9:49 AM 05/23/2023   10:42 AM 05/10/2023   10:34 AM 04/21/2023   10:19 AM 03/24/2023   10:16 AM 02/06/2023   10:47 AM  Advanced Directives  Does Patient Have a Medical Advance Directive? No No No No No No No  Would patient like information on creating a medical advance directive? No - Patient declined No - Patient declined No - Patient declined No - Patient declined No - Patient declined No - Patient declined No - Patient declined    Current Medications (verified) Outpatient Encounter Medications as of 07/05/2023  Medication Sig   acetaminophen (TYLENOL) 325 MG tablet Take 2 tablets (650 mg total) by mouth every 6 (six) hours as needed (or Fever >/= 101).   amLODipine (NORVASC) 5 MG tablet Take 1 tablet (5 mg total) by mouth daily.   atorvastatin (LIPITOR) 40 MG tablet Take 1 tablet (40 mg total) by mouth daily.   blood glucose meter kit and supplies KIT Dispense based on patient and insurance preference. Use up to four times daily as directed. .   chlorthalidone (HYGROTON) 25 MG tablet Take 1 tablet (25 mg total) by mouth daily.   Continuous Blood Gluc Receiver (DEXCOM G7 RECEIVER) DEVI Use to check blood glucose before upon waking up, meals, at bedtime, and as needed,   Continuous Glucose Sensor (DEXCOM G7 SENSOR) MISC Apply to  back of arm as directed. Change every 10 days.   diclofenac Sodium (VOLTAREN) 1 % GEL Apply 2 g topically daily as needed (hand pain).   empagliflozin (JARDIANCE) 25 MG TABS tablet Take 1 tablet (25 mg total) by mouth daily before breakfast.   ferrous sulfate 325 (65 FE) MG tablet Take 1 tablet (325 mg total) by mouth daily.   fluticasone (FLONASE) 50 MCG/ACT nasal spray Place 1 spray into both nostrils daily.   glucose 4 GM chewable tablet Chew 1 tablet (4 g total) by mouth as needed for low blood sugar.   glucose blood (ACCU-CHEK GUIDE) test strip You do not medically need to check blood sugar while not taking insulin. You can check your blood sugar if you are concerned when you want, but do not need to.   Insulin Pen Needle (PEN NEEDLES) 32G X 4 MM MISC Use as directed.   latanoprost (XALATAN) 0.005 % ophthalmic solution Place 1 drop into both eyes at bedtime.   latanoprost (XALATAN) 0.005 % ophthalmic solution Place 1 drop into both eyes daily.   Multiple Vitamins-Minerals (WOMENS 50+ MULTI VITAMIN/MIN) TABS Take 1 tablet by mouth daily.   olmesartan (BENICAR) 20 MG tablet Take 1 tablet (20 mg total) by mouth daily.   Semaglutide (RYBELSUS)  14 MG TABS Take 1 tablet (14 mg total) by mouth daily.   traMADol (ULTRAM) 50 MG tablet Take 1 tablet (50 mg total) by mouth every 6 (six) hours as needed for dental pain   [DISCONTINUED] glipiZIDE (GLUCOTROL) 5 MG tablet Take 0.5 tablets (2.5 mg total) by mouth daily before breakfast.   No facility-administered encounter medications on file as of 07/05/2023.    Allergies (verified) Gabapentin   History: Past Medical History:  Diagnosis Date   Allergy    SEASONAL   Arthritis    Cervical radiculopathy due to degenerative joint disease of spine 05/03/2017   CKD (chronic kidney disease) stage 3, GFR 30-59 ml/min (HCC) 01/01/2021   Diabetes mellitus 1996   Glaucoma    H. pylori infection    Hyperlipidemia    Hypertension    Normocytic anemia  02/14/2013   Osteopenia 05/04/2016   Pain of both shoulder joints 11/14/2016   Past Surgical History:  Procedure Laterality Date   ABDOMINAL HYSTERECTOMY     10 years ago    BREAST BIOPSY     BREAST LUMPECTOMY WITH RADIOACTIVE SEED LOCALIZATION Right 02/16/2022   Procedure: RIGHT BREAST LUMPECTOMY WITH RADIOACTIVE SEED LOCALIZATION;  Surgeon: Harriette Bouillon, MD;  Location: [facility] OR;  Service: General;  Laterality: Right;   Cataracts     ROTATOR CUFF REPAIR     SHOULDER SURGERY     Rotator cuff tear    Family History  Problem Relation Age of Onset   Diabetes Mother    Hypertension Mother    Breast cancer Mother    Cancer Mother    Diabetes Sister    Hypertension Sister    Kidney disease Sister        One Kidney transplant, one sister on HD   Breast cancer Sister    Cancer Sister    Hypertension Sister    Diabetes Sister    Cerebral palsy Brother    Cancer Brother        Unknown   Hypertension Daughter    Colon cancer Neg Hx    Esophageal cancer Neg Hx    Stomach cancer Neg Hx    Rectal cancer Neg Hx    Social History   Socioeconomic History   Marital status: Married    Spouse name: Not on file   Number of children: 1   Years of education: Not on file   Highest education level: Not on file  Occupational History   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      The highest level of education was high school diploma.      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   Social Determinants of Health   Financial Resource Strain: Medium Risk (07/05/2023)   Overall Financial Resource Strain (CARDIA)    Difficulty of Paying Living Expenses: Somewhat  hard  Food Insecurity: No Food Insecurity (07/05/2023)   Hunger Vital Sign    Worried About Running Out of Food in the Last Year: Never true    Ran Out of Food in the Last Year: Never true  Transportation Needs: No Transportation Needs (07/05/2023)   PRAPARE - Administrator, Civil Service (Medical): No    Lack of Transportation (Non-Medical): No  Physical Activity: Inactive (07/05/2023)   Exercise Vital Sign    Days of Exercise per Week: 0 days    Minutes of Exercise per Session: 0 min  Stress: Stress Concern Present (07/05/2023)   [employer] of Occupational Health - Occupational Stress Questionnaire    Feeling of Stress : To some extent  Social Connections: Socially Integrated (07/05/2023)   Social Connection and Isolation Panel [NHANES]    Frequency of Communication with Friends and Family: More than three times a week    Frequency of Social Gatherings with Friends and Family: More than three times a week    Attends Religious Services: More than 4 times per year    Active Member of Clubs or Organizations: Yes    Attends Banker Meetings: 1 to 4 times per year    Marital Status: Married    Tobacco Counseling Counseling given: Not Answered   Clinical Intake:  Pre-visit preparation completed: Yes  Pain : 0-10 Pain Score: 7  Pain Type: Chronic pain Pain Location: Leg Pain Orientation: Right, Left Pain Radiating Towards: feet Pain Descriptors / Indicators: Constant, Throbbing Pain Onset: More than a month ago Pain Frequency: Constant Pain Relieving Factors: tylenol  Pain Relieving Factors: tylenol  Nutritional Risks: None Diabetes: Yes CBG done?: Yes CBG resulted in Enter/ Edit results?: Yes Did pt. bring in CBG monitor  from home?: Yes Glucose Meter Downloaded?: Yes  How often do you need to have someone help you when you read instructions, pamphlets, or other written materials from your doctor or pharmacy?: 1 - Never What is the last grade level you completed in school?: 12th grade  Interpreter Needed?: No  Information entered by :: Vollie Brunty,cma   Activities of Daily Living    07/05/2023   11:12 AM 07/05/2023    9:49 AM  In your present state of health, do you have any difficulty performing the following activities:  Hearing? 0 0  Vision? 0 0  Difficulty concentrating or making decisions? 0 0  Walking or climbing stairs? 0 0  Dressing or bathing? 0 0  Doing errands, shopping? 0 0    Patient Care Team: [name], Florentina Addison, DO as PCP - General Wendall Stade, MD as PCP - Cardiology (Cardiology) Everette, Lamount Cohen, [name] (Pharmacy Technician) Freddie Breech, DPM as Consulting Physician (Podiatry) Eber Jones, MD as Referring Physician (Ophthalmology) Rachael Fee, MD as Attending Physician (Gastroenterology)  Indicate any recent Medical Services you may have received from other than [facility] providers in the past year (date may be approximate).     Assessment:   This is a routine wellness examination for [place].  Hearing/Vision screen No results found.  Dietary issues and exercise activities discussed:     Goals Addressed   None   Depression Screen    07/05/2023   11:12 AM 05/10/2023   10:34 AM 04/21/2023   10:19 AM 03/24/2023   10:15 AM 02/20/2023   10:21 AM 02/06/2023   10:47 AM 01/06/2023   10:18 AM  PHQ 2/9 Scores  PHQ - 2 Score 0 0 0 0  0  0  PHQ- 9 Score 0 0 0 0 0    Exception Documentation      Patient refusal     Fall Risk    07/05/2023   11:12 AM 07/05/2023    9:48 AM 05/23/2023   10:33 AM 05/10/2023   10:34 AM 04/21/2023   10:19 AM  Fall Risk   Falls in the past year? 0 0 0 0 0  Number falls in past yr: 0 0 0  0  Injury with Fall? 0 0 0  0  Risk for fall due to  : No Fall Risks No Fall Risks No Fall Risks No Fall Risks No Fall Risks  Follow up Falls evaluation completed;Falls prevention discussed Falls evaluation completed;Falls prevention discussed Falls evaluation completed Falls evaluation completed Falls evaluation completed;Falls prevention discussed    MEDICARE RISK AT HOME:  Medicare Risk at Home - 07/05/23 1115     Any stairs in or around the home? Yes    If so, are there any without handrails? No    Home free of loose throw rugs in walkways, pet beds, electrical cords, etc? No    Adequate lighting in your home to reduce risk of falls? Yes    Life alert? No    Use of a cane, walker or w/c? No    Grab bars in the bathroom? No    Shower chair or bench in shower? Yes    Elevated toilet seat or a handicapped toilet? No             TIMED UP AND GO:  Was the test performed?  No    Cognitive Function:        07/05/2023   11:12 AM 07/01/2022    8:55 AM  6CIT Screen  What Year? 0 points 0 points  What month? 0 points 0 points  What time? 0 points 0 points  Count back from 20 0 points 0 points  Months in reverse 0 points 0 points  Repeat phrase 0 points 0 points  Total Score 0 points 0 points    Immunizations Immunization History  Administered Date(s) Administered   Fluad Quad(high Dose 65+) 09/22/2019, 09/02/2021, 09/15/2022   Influenza, Seasonal, Injecte, Preservative Fre 02/14/2013   Influenza,inj,Quad PF,6+ Mos 02/19/2014, 09/16/2014, 10/27/2015, 08/15/2016, 01/29/2018, 08/28/2018, 10/12/2020   Moderna Sars-Covid-2 Vaccination 02/27/2020, 03/30/2020   Pneumococcal Conjugate-13 05/07/2018   Pneumococcal Polysaccharide-23 02/14/2013, 11/11/2019   Tdap 02/19/2014    TDAP status: Up to date  Flu Vaccine status: Up to date  Pneumococcal vaccine status: Up to date  Covid-19 vaccine status: Information provided on how to obtain vaccines.   Qualifies for Shingles Vaccine? No   Zostavax completed No   Shingrix  Completed?: No.    Education has been provided regarding the importance of this vaccine. Patient has been advised to call insurance company to determine out of pocket expense if they have not yet received this vaccine. Advised may also receive vaccine at local pharmacy or Health Dept. Verbalized acceptance and understanding.  Screening Tests Health Maintenance  Topic Date Due   Zoster Vaccines- Shingrix (1 of 2) Never done   COVID-19 Vaccine (3 - 2023-24 season) 08/19/2022   OPHTHALMOLOGY EXAM  04/07/2023   INFLUENZA VACCINE  07/20/2023   HEMOGLOBIN A1C  10/05/2023   DTaP/Tdap/Td (2 - Td or Tdap) 02/20/2024   FOOT EXAM  03/23/2024   Diabetic kidney evaluation - eGFR measurement  04/20/2024   Diabetic kidney evaluation - Urine ACR  05/22/2024   Medicare Annual Wellness (AWV)  07/04/2024   MAMMOGRAM  02/15/2025   Colonoscopy  01/11/2029   Pneumonia Vaccine 90+ Years old  Completed   DEXA SCAN  Completed   Hepatitis C Screening  Completed   HPV VACCINES  Aged Out    Health Maintenance  Health Maintenance Due  Topic Date Due   Zoster Vaccines- Shingrix (1 of 2) Never done   COVID-19 Vaccine (3 - 2023-24 season) 08/19/2022   OPHTHALMOLOGY EXAM  04/07/2023    Colorectal cancer screening: Type of screening: Colonoscopy. Completed 01/11/2019. Repeat every 10 years  Mammogram status: Completed 02/15/2023. Repeat every year:2  Bone Density status: Completed 02/29/2012. Results reflect: Bone density results: OSTEOPENIA. Repeat every 0 years.  Lung Cancer Screening: (Low Dose CT Chest recommended if Age 90-80 years, 20 pack-year currently smoking OR have quit w/in 15years.) does not qualify.   Lung Cancer Screening Referral: N/A  Additional Screening:  Hepatitis C Screening: does not qualify; Completed 11/24/2015  Vision Screening: Recommended annual ophthalmology exams for early detection of glaucoma and other disorders of the eye. Is the patient up to date with their annual eye  exam?  Yes  Who is the provider or what is the name of the office in which the patient attends annual eye exams? Dr.Hensel If pt is not established with a provider, would they like to be referred to a provider to establish care? No .   Dental Screening: Recommended annual dental exams for proper oral hygiene  Diabetic Foot Exam: Diabetic Foot Exam: Overdue, Pt has been advised about the importance in completing this exam. Pt is scheduled for diabetic foot exam on N/A.  Community Resource Referral / Chronic Care Management: CRR required this visit?  No   CCM required this visit?  No     Plan:     I have personally reviewed and noted the following in the patient's chart:   Medical and social history Use of alcohol, tobacco or illicit drugs  Current medications and supplements including opioid prescriptions. Patient is not currently taking opioid prescriptions. Functional ability and status Nutritional status Physical activity Advanced directives List of other physicians Hospitalizations, surgeries, and ER visits in previous 12 months Vitals Screenings to include cognitive, depression, and falls Referrals and appointments  In addition, I have reviewed and discussed with patient certain preventive protocols, quality metrics, and best practice recommendations. A written personalized care plan for preventive services as well as general preventive health recommendations were provided to patient.     Cala Bradford, CMA   07/05/2023   After Visit Summary: (MyChart) Due to this being a telephonic visit, the after visit summary with patients personalized plan was offered to patient via MyChart   Nurse Notes: Face-To-Face Visit  Ms. Manson Passey , Thank you for taking time to come for your Medicare Wellness Visit. I appreciate your ongoing commitment to your health goals. Please review the following plan we discussed and let me know if I can assist you in the future.   These are the goals we  discussed:  Goals      Blood Pressure < 140/90     BP Readings from Last 3 Encounters:  09/14/20 (!) 199/97  07/03/20 (!) 177/87  06/10/20 (!) 152/84    Not meeting blood pressure targets      Have 3 meals a day     HEMOGLOBIN A1C < 7     Lab Results  Component Value Date   HGBA1C 7.9 (A) 07/03/2020  Not  meeting Hgb A1C target.     LDL CALC < 100     Lab Results  Component Value Date   CHOL 127 09/10/2019   HDL 54 09/10/2019   LDLCALC 54 09/10/2019   TRIG 115 09/13/2019   CHOLHDL 2.4 09/10/2019    Meeting lipid targets         This is a list of the screening recommended for you and due dates:  Health Maintenance  Topic Date Due   Zoster (Shingles) Vaccine (1 of 2) Never done   COVID-19 Vaccine (3 - 2023-24 season) 08/19/2022   Eye exam for diabetics  04/07/2023   Flu Shot  07/20/2023   Hemoglobin A1C  10/05/2023   DTaP/Tdap/Td vaccine (2 - Td or Tdap) 02/20/2024   Complete foot exam   03/23/2024   Yearly kidney function blood test for diabetes  04/20/2024   Yearly kidney health urinalysis for diabetes  05/22/2024   Medicare Annual Wellness Visit  07/04/2024   Mammogram  02/15/2025   Colon Cancer Screening  01/11/2029   Pneumonia Vaccine  Completed   DEXA scan (bone density measurement)  Completed   Hepatitis C Screening  Completed   HPV Vaccine  Aged Out

## 2023-07-05 NOTE — Progress Notes (Signed)
Subjective:  CC: diabetes  HPI:  [name] is a 72 y.o. female with a past medical history stated below and presents today for follow-up on diabetes.  She has been doing well since last visit a month ago. Please see problem based assessment and plan for additional details.  Past Medical History:  Diagnosis Date   Allergy    SEASONAL   Arthritis    Cervical radiculopathy due to degenerative joint disease of spine 05/03/2017   CKD (chronic kidney disease) stage 3, GFR 30-59 ml/min (HCC) 01/01/2021   Diabetes mellitus 1996   Glaucoma    H. pylori infection    Hyperlipidemia    Hypertension    Normocytic anemia 02/14/2013   Osteopenia 05/04/2016   Pain of both shoulder joints 11/14/2016    Current Outpatient Medications on File Prior to Visit  Medication Sig Dispense Refill   acetaminophen (TYLENOL) 325 MG tablet Take 2 tablets (650 mg total) by mouth every 6 (six) hours as needed (or Fever >/= 101). 60 tablet 0   amLODipine (NORVASC) 5 MG tablet Take 1 tablet (5 mg total) by mouth daily. 30 tablet 11   atorvastatin (LIPITOR) 40 MG tablet Take 1 tablet (40 mg total) by mouth daily. 90 tablet 2   blood glucose meter kit and supplies KIT Dispense based on patient and insurance preference. Use up to four times daily as directed. . 1 each 0   chlorthalidone (HYGROTON) 25 MG tablet Take 1 tablet (25 mg total) by mouth daily. 90 tablet 3   Continuous Blood Gluc Receiver (DEXCOM G7 RECEIVER) DEVI Use to check blood glucose before upon waking up, meals, at bedtime, and as needed, 1 each 0   Continuous Glucose Sensor (DEXCOM G7 SENSOR) MISC Apply to back of arm as directed. Change every 10 days. 9 each 2   diclofenac Sodium (VOLTAREN) 1 % GEL Apply 2 g topically daily as needed (hand pain). 100 g 2   empagliflozin (JARDIANCE) 25 MG TABS tablet Take 1 tablet (25 mg total) by mouth daily before breakfast. 90 tablet 2   ferrous sulfate 325 (65 FE) MG tablet Take 1 tablet (325 mg  total) by mouth daily. 90 tablet 3   fluticasone (FLONASE) 50 MCG/ACT nasal spray Place 1 spray into both nostrils daily. 16 g 2   glucose 4 GM chewable tablet Chew 1 tablet (4 g total) by mouth as needed for low blood sugar. 50 tablet 12   glucose blood (ACCU-CHEK GUIDE) test strip You do not medically need to check blood sugar while not taking insulin. You can check your blood sugar if you are concerned when you want, but do not need to. 100 each 12   Insulin Pen Needle (PEN NEEDLES) 32G X 4 MM MISC Use as directed. 100 each 10   latanoprost (XALATAN) 0.005 % ophthalmic solution Place 1 drop into both eyes at bedtime.     latanoprost (XALATAN) 0.005 % ophthalmic solution Place 1 drop into both eyes daily. 2.5 mL 3   Multiple Vitamins-Minerals (WOMENS 50+ MULTI VITAMIN/MIN) TABS Take 1 tablet by mouth daily. 30 tablet    olmesartan (BENICAR) 20 MG tablet Take 1 tablet (20 mg total) by mouth daily. 30 tablet 11   Semaglutide (RYBELSUS) 14 MG TABS Take 1 tablet (14 mg total) by mouth daily. 90 tablet 2   traMADol (ULTRAM) 50 MG tablet Take 1 tablet (50 mg total) by mouth every 6 (six) hours as needed for dental pain 10 tablet  0   [DISCONTINUED] glipiZIDE (GLUCOTROL) 5 MG tablet Take 0.5 tablets (2.5 mg total) by mouth daily before breakfast. 15 tablet 0   No current facility-administered medications on file prior to visit.    Family History  Problem Relation Age of Onset   Diabetes Mother    Hypertension Mother    Breast cancer Mother    Cancer Mother    Diabetes Sister    Hypertension Sister    Kidney disease Sister        One Kidney transplant, one sister on HD   Breast cancer Sister    Cancer Sister    Hypertension Sister    Diabetes Sister    Cerebral palsy Brother    Cancer Brother        Unknown   Hypertension Daughter    Colon cancer Neg Hx    Esophageal cancer Neg Hx    Stomach cancer Neg Hx    Rectal cancer Neg Hx     Social History   Socioeconomic History   Marital  status: Married    Spouse name: Not on file   Number of children: 1   Years of education: Not on file   Highest education level: Not on file  Occupational History   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      The highest level of education was high school diploma.      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   Social Determinants of Health   Financial Resource Strain: Medium Risk (07/05/2023)   Overall Financial Resource Strain (CARDIA)    Difficulty of Paying Living Expenses: Somewhat hard  Food Insecurity: No Food Insecurity (07/05/2023)   Hunger Vital Sign    Worried About Running Out of Food in the Last Year: Never true    Ran Out of Food in the Last Year: Never true  Transportation Needs: No Transportation Needs (07/05/2023)   PRAPARE - Administrator, Civil Service (Medical): No    Lack of Transportation (Non-Medical): No  Physical Activity: Inactive (07/05/2023)   Exercise Vital Sign    Days of Exercise per Week: 0 days    Minutes of Exercise per Session: 0 min  Stress: Stress Concern Present (07/05/2023)   [employer] of Occupational Health - Occupational Stress Questionnaire    Feeling of Stress : To some extent  Social Connections: Socially Integrated (07/05/2023)   Social Connection and Isolation Panel [NHANES]    Frequency of Communication with Friends and Family: More than three times a week    Frequency of  Social Gatherings with Friends and Family: More than three times a week    Attends Religious Services: More than 4 times per year    Active Member of [employer] or Organizations: Yes    Attends Banker Meetings: 1 to 4 times  per year    Marital Status: Married  Catering manager Violence: Not At Risk (07/05/2023)   Humiliation, Afraid, Rape, and Kick questionnaire    Fear of Current or Ex-Partner: No    Emotionally Abused: No    Physically Abused: No    Sexually Abused: No    Review of Systems: ROS negative except for what is noted on the assessment and plan.  Objective:   Vitals:   07/05/23 0946  BP: 134/72  Pulse: 84  Temp: 98.3 F (36.8 C)  TempSrc: Oral  SpO2: 100%  Weight: 145 lb 11.2 oz (66.1 kg)  Height: 5\' 1"  (1.549 m)    Physical Exam: Constitutional: well-appearing  Cardiovascular: regular rate and rhythm, no m/r/g Pulmonary/Chest: normal work of breathing on room air, lungs clear to auscultation bilaterally Abdominal: soft, non-tender, non-distended MSK: normal bulk and tone Skin: warm and dry    Assessment & Plan:  Type II diabetes mellitus with complication (HCC) CGM reviewed and active >93% of time. She has lows <1% of time, she continues to have elevated blood sugars following lunch and dinner. AM sugar normal with occasional low. Medications include Degludec 12 units every day, lispro 2, 4,4 units, Rybelsus 14 mg (getting through [employer] last picked up 5/24 for 4 months), Jardiance 25 mg (clinic samples, denied for Medicare extra help). A1c is unchanged from May at 8.9 P: Decrease degludec from 12 to 8 units On additional questioning it sounds like she is administering 3-4 units with lunch and dinner depending on what she is eating. Will not increase to 5 at this time and I talked with her about trying 4 units more often. Will plan for her to follow-up with Lupita Leash in 2 weeks to see how increasing meal time insulin is going. She is worried about  having hypoglycemic episodes.  -message sent to [facility] to see if other option for jardiance since she was denied with Medicaid -referral for ophthalmology placed   Drug name: Jardiance       Strength: 25 mg         Qty: 28  LOT: [id number]  Exp.Date: Feb 2026  Dosing instructions: take one tab daily  The patient has been instructed regarding the correct time, dose, and frequency of taking this medication, including desired effects and most common side effects.   Essential hypertension Home blood pressure well controlled with systolic in 110-120/ 80s. She is adherent with amlodipine 20 mg, chlorthalidone 25 mg, and olmesartan 25 mg. 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 She follows with nephrology. -continue current medications  Microalbuminuria She follows with nephrology and has an appointment next month. She is adherent with jardiance, rybelsus and olmesartan.    Patient discussed with Dr. Dionne Bucy Jeriel Vivanco, D.O. [facility]  PGY-3 Pager: [phone]  Phone: [phone] Date 07/05/2023  Time 1:37 PM

## 2023-07-06 ENCOUNTER — Other Ambulatory Visit (HOSPITAL_COMMUNITY): Payer: Self-pay

## 2023-07-06 NOTE — Addendum Note (Signed)
Addended by: Earl Lagos on: 07/06/2023 10:41 AM   Modules accepted: Level of Service

## 2023-07-06 NOTE — Progress Notes (Signed)
Internal Medicine Clinic Attending  Case discussed with the resident at the time of the visit.  We reviewed the resident's history and exam and pertinent patient test results.  I agree with the assessment, diagnosis, and plan of care documented in the resident's note.  

## 2023-07-12 NOTE — Telephone Encounter (Signed)
Submitted application for [employer] to [employer]) for patient assistance.   Phone: [phone]

## 2023-07-12 NOTE — Progress Notes (Signed)
This encounter was created in error - please disregard. I called patient to have AWV with her and she informed me that she had to reschedule for another day.

## 2023-07-17 ENCOUNTER — Other Ambulatory Visit (HOSPITAL_COMMUNITY): Payer: Self-pay

## 2023-07-17 NOTE — Telephone Encounter (Signed)
Application decision pending.   Rec'd fax from company stating invalid RX page. Will call to follow up.

## 2023-07-18 ENCOUNTER — Other Ambulatory Visit (HOSPITAL_COMMUNITY): Payer: Self-pay

## 2023-07-20 NOTE — Telephone Encounter (Signed)
Received notification from [employer]) regarding approval for [employer]. Patient assistance approved from 07/19/23 to 12/19/23.  Medication will ship to patients home. Patient will need to call company for refills.  Phone: [phone]

## 2023-07-20 NOTE — Progress Notes (Signed)
Internal Medicine Clinic Attending  Case and documentation of Dr. [name]  reviewed.  I reviewed the AWV findings.  I agree with the assessment, diagnosis, and plan of care documented in the AWV note.     

## 2023-07-24 ENCOUNTER — Ambulatory Visit (INDEPENDENT_AMBULATORY_CARE_PROVIDER_SITE_OTHER): Payer: Medicare Other | Admitting: Dietician

## 2023-07-24 DIAGNOSIS — E118 Type 2 diabetes mellitus with unspecified complications: Secondary | ICD-10-CM

## 2023-07-24 NOTE — Patient Instructions (Addendum)
We'll work on trying to keep carbohydrates (CARBS)  consistent starting with breakfast.    Try to eat 40 grams carbs after taking 2 units of rapid acting insulin for breakfast.    I'd like you to write down the date and three meals you planned the 40 grams of carbs for breakfast.    You can write down at least 3 days and as many as you'd like to help  yourself try to eat 40 grams each morning for breakfast.  Amanda [name] [phone]

## 2023-07-24 NOTE — Progress Notes (Signed)
Medical Nutrition Therapy:  Appt start time: 1330 end time:  1430. Total time: 60 Visit # 1 in 2024  Last visit was 11/2022 working on CGM use Assessment:  Primary concerns today: glycemic control.  Ms Ireland states she had a birthday and lost her spouse in the past few weeks. She states he is determined to take better care of herself now that she has the time to do so. She used the new insulin regimen for 3-4 days after she left the office and then went back to her previous one of 12 units [place] daily with 2 units,  3 units, 3 units rapid acting at breakfast. Lunch and dinner respectively.   She is thinking about starting to swim and walk on a regular basis and was able to states the safety measure before starting activity.  Preferred Learning Style: No preference indicated  Learning Readiness: Ready and Change in progress  ANTHROPOMETRICS: Estimated body mass index is 27.53 kg/m as calculated from the following:   Height as of 07/05/23: 5\' 1"  (1.549 m).   Weight as of 07/05/23: 145 lb 11.2 oz (66.1 kg).  WEIGHT HISTORY: - she elected not to weigh today Wt Readings from Last 10 Encounters:  07/05/23 145 lb 11.2 oz (66.1 kg)  07/05/23 145 lb 11.2 oz (66.1 kg)  05/23/23 146 lb 14.4 oz (66.6 kg)  05/10/23 146 lb 3.2 oz (66.3 kg)  04/21/23 144 lb (65.3 kg)  03/24/23 142 lb 8 oz (64.6 kg)  02/23/23 145 lb (65.8 kg)  02/20/23 145 lb (65.8 kg)  02/14/23 154 lb (69.9 kg)  02/06/23 148 lb 14.4 oz (67.5 kg)    SLEEP:need to assess at future visit  MEDICATIONS: she reports taking all diabetes medicine as diredted except insulin as noted in assessment BLOOD SUGAR: She has two lows on July 13 and 29th. The 13th was before her insulin was changed and the 29th was after she says she went back to the previous regimen. CGM Results from download:   % Time CGM active:   83.6 %   (Goal >70%)  Average glucose:   199 mg/dL for 14 days  Glucose management indicator:   8.1 %  Time in range (70-180  mg/dL):   48 %   (Goal >09%)  Time High (181-250 mg/dL):   32 %   (Goal < 81%)  Time Very High (>250 mg/dL):    20 %   (Goal < 5%)  Time Low (54-69 mg/dL):   0 %   (Goal <1%)  Time Very Low (<54 mg/dL):   0 %   (Goal <9%)  Coefficient of variation:   33.8 %   (Goal <36%)   Lab Results  Component Value Date   HGBA1C 8.9 (A) 07/05/2023   HGBA1C 8.9 (A) 05/10/2023   HGBA1C 7.6 (A) 02/06/2023   HGBA1C 7.7 (A) 11/17/2022   HGBA1C 10.9 (A) 08/19/2022     DIETARY INTAKE: Usual eating pattern includes 3 meals and nibbling from 1-6 PM on soda, sweet tea, peanut butter crackers, fruit, mints, gum. Dining Out (times/week): 1-2 24-hr recall:  B ( 9-10 AM): cold cereal milk and 1/4 banana or eggs, bacon and grits with sometimes toast  L (1-2 PM): sometimes a sandwich Snk ( PM): see snacks listed above D ( PM): birthday meal: fried chicken 1/2 breast, fried okra 1c, sweet pot casserole 1/4 c, mashed pot gravy 1/4 c. Green beans and yeast roll Snk ( PM):  none Beverages: water. Sweet tea, regular  soda, milk, orange juice  Usual physical activity: see above- is thinking about starting back   Progress Towards Goal(s):  In progress.   Nutritional Diagnosis:  NB-1.1 Food and nutrition-related knowledge deficit As related to new CGM and lack of sufficient diabetes self management training.  As evidenced by her report and questions about food intake.   Intervention:  Nutrition education about carb counting using consistency as beginning goal. Consider lower carb snack ideas Action Goal: see patient instructions  Outcome goal: increase knowledge of carb content of food Coordination of care: share with Dr. Sloan Leiter, consider offering smart pen or insulin administration device like cecur  Teaching Method Utilized: Visual, Auditory,Hands on Handouts given during visit include:carb counting pamphlets Barriers to learning/adherence to lifestyle change: emotions and adjustment from recent death of  spouse Demonstrated degree of understanding via:  Teach Back   Monitoring/Evaluation:  Dietary intake, exercise, reader and breakfast meals, and body weight in 3 week(s). Norm Parcel, RD 07/24/2023 3:32 PM.

## 2023-07-25 NOTE — Addendum Note (Signed)
Addended by: Lucille Passy on: 07/25/2023 02:35 PM   Modules accepted: Orders

## 2023-07-26 ENCOUNTER — Other Ambulatory Visit (HOSPITAL_COMMUNITY): Payer: Self-pay

## 2023-07-27 ENCOUNTER — Other Ambulatory Visit (HOSPITAL_COMMUNITY): Payer: Self-pay

## 2023-08-01 ENCOUNTER — Ambulatory Visit (INDEPENDENT_AMBULATORY_CARE_PROVIDER_SITE_OTHER): Payer: Medicare Other

## 2023-08-01 DIAGNOSIS — E1142 Type 2 diabetes mellitus with diabetic polyneuropathy: Secondary | ICD-10-CM

## 2023-08-01 DIAGNOSIS — M2011 Hallux valgus (acquired), right foot: Secondary | ICD-10-CM

## 2023-08-01 DIAGNOSIS — M2042 Other hammer toe(s) (acquired), left foot: Secondary | ICD-10-CM

## 2023-08-01 DIAGNOSIS — M2041 Other hammer toe(s) (acquired), right foot: Secondary | ICD-10-CM

## 2023-08-01 DIAGNOSIS — M2012 Hallux valgus (acquired), left foot: Secondary | ICD-10-CM

## 2023-08-01 DIAGNOSIS — L84 Corns and callosities: Secondary | ICD-10-CM

## 2023-08-01 NOTE — Progress Notes (Signed)

## 2023-08-04 ENCOUNTER — Other Ambulatory Visit: Payer: Self-pay

## 2023-08-04 ENCOUNTER — Other Ambulatory Visit (HOSPITAL_COMMUNITY): Payer: Self-pay

## 2023-08-04 ENCOUNTER — Ambulatory Visit (INDEPENDENT_AMBULATORY_CARE_PROVIDER_SITE_OTHER): Payer: Medicare Other | Admitting: Student

## 2023-08-04 VITALS — BP 126/58 | HR 79 | Wt 150.0 lb

## 2023-08-04 DIAGNOSIS — E118 Type 2 diabetes mellitus with unspecified complications: Secondary | ICD-10-CM | POA: Diagnosis not present

## 2023-08-04 DIAGNOSIS — K5900 Constipation, unspecified: Secondary | ICD-10-CM

## 2023-08-04 DIAGNOSIS — E1121 Type 2 diabetes mellitus with diabetic nephropathy: Secondary | ICD-10-CM

## 2023-08-04 DIAGNOSIS — Z7984 Long term (current) use of oral hypoglycemic drugs: Secondary | ICD-10-CM | POA: Diagnosis not present

## 2023-08-04 DIAGNOSIS — F4321 Adjustment disorder with depressed mood: Secondary | ICD-10-CM | POA: Diagnosis not present

## 2023-08-04 DIAGNOSIS — Z794 Long term (current) use of insulin: Secondary | ICD-10-CM

## 2023-08-04 DIAGNOSIS — Z7189 Other specified counseling: Secondary | ICD-10-CM

## 2023-08-04 MED ORDER — TRESIBA FLEXTOUCH 100 UNIT/ML ~~LOC~~ SOPN
8.0000 [IU] | PEN_INJECTOR | Freq: Every day | SUBCUTANEOUS | Status: DC
Start: 2023-08-04 — End: 2023-08-11

## 2023-08-04 MED ORDER — POLYETHYLENE GLYCOL 3350 17 GM/SCOOP PO POWD
17.0000 g | Freq: Once | ORAL | 0 refills | Status: AC
  Filled 2023-08-04: qty 238, 14d supply, fill #0

## 2023-08-04 MED ORDER — RYBELSUS 14 MG PO TABS
14.0000 mg | ORAL_TABLET | Freq: Every day | ORAL | 2 refills | Status: DC
  Filled 2023-08-04: qty 90, 90d supply, fill #0

## 2023-08-04 NOTE — Progress Notes (Unsigned)
CC: Follow-up  HPI:  [name] is a 72 y.o. female living with a history stated below and presents today for a follow-up. Please see problem based assessment and plan for additional details.  Past Medical History:  Diagnosis Date   Allergy    SEASONAL   Arthritis    Cervical radiculopathy due to degenerative joint disease of spine 05/03/2017   CKD (chronic kidney disease) stage 3, GFR 30-59 ml/min (HCC) 01/01/2021   Diabetes mellitus 1996   Glaucoma    H. pylori infection    Hyperlipidemia    Hypertension    Normocytic anemia 02/14/2013   Osteopenia 05/04/2016   Pain of both shoulder joints 11/14/2016    Current Outpatient Medications on File Prior to Visit  Medication Sig Dispense Refill   acetaminophen (TYLENOL) 325 MG tablet Take 2 tablets (650 mg total) by mouth every 6 (six) hours as needed (or Fever >/= 101). 60 tablet 0   amLODipine (NORVASC) 5 MG tablet Take 1 tablet (5 mg total) by mouth daily. 30 tablet 11   atorvastatin (LIPITOR) 40 MG tablet Take 1 tablet (40 mg total) by mouth daily. 90 tablet 2   blood glucose meter kit and supplies KIT Dispense based on patient and insurance preference. Use up to four times daily as directed. . 1 each 0   chlorthalidone (HYGROTON) 25 MG tablet Take 1 tablet (25 mg total) by mouth daily. 90 tablet 3   Continuous Blood Gluc Receiver (DEXCOM G7 RECEIVER) DEVI Use to check blood glucose before upon waking up, meals, at bedtime, and as needed, 1 each 0   Continuous Glucose Sensor (DEXCOM G7 SENSOR) MISC Apply to back of arm as directed. Change every 10 days. 9 each 2   diclofenac Sodium (VOLTAREN) 1 % GEL Apply 2 g topically daily as needed (hand pain). 100 g 2   empagliflozin (JARDIANCE) 25 MG TABS tablet Take 1 tablet (25 mg total) by mouth daily before breakfast. 90 tablet 2   ferrous sulfate 325 (65 FE) MG tablet Take 1 tablet (325 mg total) by mouth daily. 90 tablet 3   fluticasone (FLONASE) 50 MCG/ACT nasal spray  Place 1 spray into both nostrils daily. 16 g 2   glucose 4 GM chewable tablet Chew 1 tablet (4 g total) by mouth as needed for low blood sugar. 50 tablet 12   glucose blood (ACCU-CHEK GUIDE) test strip You do not medically need to check blood sugar while not taking insulin. You can check your blood sugar if you are concerned when you want, but do not need to. 100 each 12   Insulin Lispro-aabc (LYUMJEV KWIKPEN) 100 UNIT/ML KwikPen Inject 2 Units into the skin daily with breakfast AND 4 Units daily with lunch AND 4 Units daily with supper. 3 mL 12   Insulin Pen Needle (PEN NEEDLES) 32G X 4 MM MISC Use as directed. 100 each 10   latanoprost (XALATAN) 0.005 % ophthalmic solution Place 1 drop into both eyes at bedtime.     latanoprost (XALATAN) 0.005 % ophthalmic solution Place 1 drop into both eyes daily. 2.5 mL 3   Multiple Vitamins-Minerals (WOMENS 50+ MULTI VITAMIN/MIN) TABS Take 1 tablet by mouth daily. 30 tablet    olmesartan (BENICAR) 20 MG tablet Take 1 tablet (20 mg total) by mouth daily. 30 tablet 11   traMADol (ULTRAM) 50 MG tablet Take 1 tablet (50 mg total) by mouth every 6 (six) hours as needed for dental pain 10 tablet 0   [  DISCONTINUED] glipiZIDE (GLUCOTROL) 5 MG tablet Take 0.5 tablets (2.5 mg total) by mouth daily before breakfast. 15 tablet 0   No current facility-administered medications on file prior to visit.    Family History  Problem Relation Age of Onset   Diabetes Mother    Hypertension Mother    Breast cancer Mother    Cancer Mother    Diabetes Sister    Hypertension Sister    Kidney disease Sister        One Kidney transplant, one sister on HD   Breast cancer Sister    Cancer Sister    Hypertension Sister    Diabetes Sister    Cerebral palsy Brother    Cancer Brother        Unknown   Hypertension Daughter    Colon cancer Neg Hx    Esophageal cancer Neg Hx    Stomach cancer Neg Hx    Rectal cancer Neg Hx     Social History   Socioeconomic History    Marital status: Married    Spouse name: Not on file   Number of children: 1   Years of education: Not on file   Highest education level: Not on file  Occupational History   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      The highest level of education was high school diploma.      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   Social Determinants of Health   Financial Resource Strain: Medium Risk (07/05/2023)   Overall Financial Resource Strain (CARDIA)    Difficulty of Paying Living Expenses: Somewhat hard  Food Insecurity: No Food Insecurity (07/05/2023)   Hunger Vital Sign    Worried About Running Out of Food in the Last Year: Never true    Ran Out of Food in the Last Year: Never true  Transportation Needs: No Transportation Needs (07/05/2023)   PRAPARE - Administrator, Civil Service (Medical): No    Lack of Transportation (Non-Medical): No  Physical Activity: Inactive (07/05/2023)   Exercise Vital Sign    Days of Exercise per Week: 0 days    Minutes of Exercise per Session: 0 min  Stress: Stress Concern Present (07/05/2023)   [employer] of Occupational Health - Occupational Stress Questionnaire    Feeling of Stress : To some extent  Social Connections: Socially Integrated (07/05/2023)   Social Connection and Isolation Panel [NHANES]    Frequency of Communication with Friends and Family: More than three times a week     Frequency of Social Gatherings with Friends and Family: More than three times a week    Attends Religious Services: More than 4 times per year    Active Member of [employer] or Organizations: Yes    Attends Banker Meetings: 1 to 4 times per year  Marital Status: Married  Catering manager Violence: Not At Risk (07/05/2023)   Humiliation, Afraid, Rape, and Kick questionnaire    Fear of Current or Ex-Partner: No    Emotionally Abused: No    Physically Abused: No    Sexually Abused: No    Review of Systems: ROS negative except for what is noted on the assessment and plan.  Vitals:   08/04/23 0938  BP: (!) 126/58  Pulse: 79  SpO2: 100%  Weight: 150 lb (68 kg)    Physical Exam: Constitutional: well-appearing, sitting in chair, in no acute distress Cardiovascular: regular rate and rhythm, no m/r/g Pulmonary/Chest: normal work of breathing on room air, lungs clear to auscultation bilaterally  Assessment & Plan:     Patient seen with Dr. Oswaldo Done  Type II diabetes mellitus with complication (HCC) A1c 06/2023 was 8.9. Managed with Lispro 2, 4,4 units, Rybelsus 14 mg, and Tresiba 8 units/daily. Patient brought CGM from past few weeks with improvement in % within target range with projected A1c of 7.0. On review of daily log, a few spikes into the higher 200's occur around lunch/dinner. Patient states that she has forgotten to give lispro or injected after she got home from meals on several occasions. Notably, patient's husband passed away last month which has been weighing heavily on her (see below). Patient instructed on proper use of insulin and further counseled on the importance of diet/exercise. - Medication refills - Re-evaluate A1c in 2 months  Grief Patient's husband passed away last month which has been a major stressor. Patient tearful during interaction and states it would be helpful to meet with counselor. [facility] referral  Constipation Patient endorses one-week  history of constipation. Asymptomatic otherwise. Patient has a history of diarrhea so she takes imodium, then get's constipated and takes a stool-softener. This has been a cycle for months. Up-to-date on colonoscopy. Patient instructed to discontinue imodium and stool softener. - Order Miralax and instructed to discontinue once BM has occurred.    Carmina Miller, D.O. [facility], PGY-1 Phone: [phone] Date 08/07/2023 Time 8:13 AM

## 2023-08-04 NOTE — Patient Instructions (Signed)
Thank you for allowing me to be a part of your care team. Today we discussed a few things:  Please add additional short-acting insulin with larger meals/those higher in sugar. I sent in a prescription for Miralax. You can take one dose today and one dose tomorrow if you have not yet had a bowel movement. I would hold off on any imodium/dulcolax as these are likely contributing to constipation/diarrhea. Also please keep a food journal when you experience diarrhea to see if we can better track offending foods.

## 2023-08-07 ENCOUNTER — Other Ambulatory Visit (HOSPITAL_COMMUNITY): Payer: Self-pay

## 2023-08-07 DIAGNOSIS — F4321 Adjustment disorder with depressed mood: Secondary | ICD-10-CM | POA: Insufficient documentation

## 2023-08-07 HISTORY — DX: Adjustment disorder with depressed mood: F43.21

## 2023-08-07 NOTE — Assessment & Plan Note (Signed)
A1c 06/2023 was 8.9. Managed with Lispro 2, 4,4 units, Rybelsus 14 mg, and Tresiba 8 units/daily. Patient brought CGM from past few weeks with improvement in % within target range with projected A1c of 7.0. On review of daily log, a few spikes into the higher 200's occur around lunch/dinner. Patient states that she has forgotten to give lispro or injected after she got home from meals on several occasions. Notably, patient's husband passed away last month which has been weighing heavily on her (see below). Patient instructed on proper use of insulin and further counseled on the importance of diet/exercise. - Medication refills - Re-evaluate A1c in 2 months

## 2023-08-07 NOTE — Assessment & Plan Note (Signed)
Patient endorses one-week history of constipation. Asymptomatic otherwise. Patient has a history of diarrhea so she takes imodium, then get's constipated and takes a stool-softener. This has been a cycle for months. Up-to-date on colonoscopy. Patient instructed to discontinue imodium and stool softener. - Order Miralax and instructed to discontinue once BM has occurred.

## 2023-08-07 NOTE — Progress Notes (Signed)
Internal Medicine Clinic Attending  I was physically present during the key portions of the resident provided service and participated in the medical decision making of patient's management care. I reviewed pertinent patient test results.  The assessment, diagnosis, and plan were formulated together and I agree with the documentation in the resident's note.  Erlinda Hong, MD FACP

## 2023-08-07 NOTE — Assessment & Plan Note (Signed)
Patient's husband passed away last month which has been a major stressor. Patient tearful during interaction and states it would be helpful to meet with counselor. [facility] referral

## 2023-08-09 ENCOUNTER — Other Ambulatory Visit (HOSPITAL_COMMUNITY): Payer: Self-pay

## 2023-08-09 ENCOUNTER — Other Ambulatory Visit: Payer: Self-pay | Admitting: Internal Medicine

## 2023-08-09 DIAGNOSIS — E1121 Type 2 diabetes mellitus with diabetic nephropathy: Secondary | ICD-10-CM

## 2023-08-10 ENCOUNTER — Other Ambulatory Visit (HOSPITAL_COMMUNITY): Payer: Self-pay

## 2023-08-10 NOTE — Telephone Encounter (Signed)
Pt states she needs tresiba to changed to 8 units instead 12 units. Please call pt back.

## 2023-08-11 ENCOUNTER — Other Ambulatory Visit (HOSPITAL_COMMUNITY): Payer: Self-pay

## 2023-08-11 MED ORDER — TRESIBA FLEXTOUCH 100 UNIT/ML ~~LOC~~ SOPN
8.0000 [IU] | PEN_INJECTOR | Freq: Every day | SUBCUTANEOUS | 9 refills | Status: DC
Start: 2023-08-11 — End: 2024-02-21
  Filled 2023-08-11: qty 3, 30d supply, fill #0
  Filled 2023-10-11: qty 3, 30d supply, fill #1
  Filled 2024-01-22: qty 3, 30d supply, fill #2

## 2023-08-15 ENCOUNTER — Ambulatory Visit (INDEPENDENT_AMBULATORY_CARE_PROVIDER_SITE_OTHER): Payer: Medicare Other | Admitting: Dietician

## 2023-08-15 ENCOUNTER — Encounter: Payer: Self-pay | Admitting: Dietician

## 2023-08-15 ENCOUNTER — Ambulatory Visit (INDEPENDENT_AMBULATORY_CARE_PROVIDER_SITE_OTHER): Payer: Medicare Other | Admitting: Licensed Clinical Social Worker

## 2023-08-15 VITALS — Wt 148.0 lb

## 2023-08-15 DIAGNOSIS — Z7189 Other specified counseling: Secondary | ICD-10-CM

## 2023-08-15 DIAGNOSIS — E118 Type 2 diabetes mellitus with unspecified complications: Secondary | ICD-10-CM

## 2023-08-15 NOTE — Progress Notes (Signed)
Medical Nutrition Therapy:  Appt start time: 1105 end time:  1205 Total time: 60 Visit # 2  Assessment:  Primary concerns today: glycemic control.  Ms Dillashaw states her last long acting insulin was 3 days ago due to a mix up in her dose and prescription.  Her blood sugar with no basal insulin today was 128 and 130 after a 2 units bolus of Lyumjev for 1 slice toast wit butter for breakfast. She states she was really very afraid of taking the mealtime doses as prescribe but feels she is becoming more comfortable with it. We discussed the lower basal rte may be helping prevent hypoglycemia.  Patient did not bring her notes about her meals today, but feels she has been eating consistent carbs for breakfasts. However, she is still having trouble remembering what carbs are and what foods contain carbs, she is also grieving after loss of spouse and eating meals at family's houses. She states she thinks her biggest problem with food intake is dinner and after dinner and she is still thinking about starting to  exercise regularly. Ms. Sundstrom states kidney doctor concerned about her rise in creatinine.   ANTHROPOMETRICS: Estimated body mass index is 27.96 kg/m as calculated from the following:   Height as of 07/05/23: 5\' 1"  (1.549 m).   Weight as of this encounter: 148 lb (67.1 kg).  WEIGHT HISTORY: she is trying to keep her weight down Wt Readings from Last 10 Encounters:  08/15/23 148 lb (67.1 kg)  08/04/23 150 lb (68 kg)  07/05/23 145 lb 11.2 oz (66.1 kg)  07/05/23 145 lb 11.2 oz (66.1 kg)  05/23/23 146 lb 14.4 oz (66.6 kg)  05/10/23 146 lb 3.2 oz (66.3 kg)  04/21/23 144 lb (65.3 kg)  03/24/23 142 lb 8 oz (64.6 kg)  02/23/23 145 lb (65.8 kg)  02/20/23 145 lb (65.8 kg)   SLEEP:need to assess at future visit  MEDICATIONS: she reports taking all mealtime insulin as directed for almost 1 week with no lows but some alarms waring her that her sugar was headed down BLOOD SUGAR:  improved  CGM Results  from download: Last 90 days  Last 14 days  % Time CGM active:   95 %   (Goal >70%)  97.9%  Average glucose:   184 mg/dL for 90 days  308 for 14 days  Glucose management indicator:   7.7%  7.3%  Time in range (70-180 mg/dL):   65%   (Goal >78%)  64%  Time High (181-250 mg/dL):   27 %   (Goal < 46%)  24 %  Time Very High (>250 mg/dL):    18 %   (Goal < 5%)  12%  Time Low (54-69 mg/dL):   0 %   (Goal <9%)  0%  Time Very Low (<54 mg/dL):   <6%   (Goal <2%)  0%  Coefficient of variation:   37.7%   (Goal <36%)  35.6%   Lab Results  Component Value Date   HGBA1C 8.9 (A) 07/05/2023   HGBA1C 8.9 (A) 05/10/2023   HGBA1C 7.6 (A) 02/06/2023   HGBA1C 7.7 (A) 11/17/2022   HGBA1C 10.9 (A) 08/19/2022     DIETARY INTAKE: Usual eating pattern includes 3 meals and nibbling from 1-6 PM on soda, sweet tea, peanut butter crackers, fruit, mints, gum. Dining Out (times/week): 1-2 24-hr recall:  B ( 9-10 AM): cold cereal milk and 1/4 banana or eggs, bacon and grits with sometimes toast. Biscuit, orange juice  L (1-2 PM): sometimes a sandwich, chips and soda or sweet tea or lemonade Snk ( PM): see snacks listed above D ( PM): fried or baked chicken. Salmon, potatoes, Greens, cornbread, sweet tea, soda or water or Burger, fries Snk ( PM):   discuss more at follow up Beverages: water. Sweet tea, regular soda, milk, orange juice  Usual physical activity: see above- is thinking about starting back   Progress Towards Goal(s):  Some progress.   Nutritional Diagnosis:  NB-1.1 Food and nutrition-related knowledge deficit As related to lack of sufficient diabetes self management training/meal planning in particular  As evidenced by her report and questions about food intake.   Intervention:  Nutrition education about carb counting using consistency as beginning goal. Consider lower carb snack ideas Action Goal: see patient instructions  Outcome goal: increase knowledge of carb content of food Coordination of  care: share with Dr. Sloan Leiter, consider offering smart pen or insulin administration device like cecur as needed  Teaching Method Utilized: Visual, Auditory,Hands on Handouts given during visit include:carb counting pamphlets Barriers to learning/adherence to lifestyle change: emotions and adjustment from recent death of spouse Demonstrated degree of understanding via:  Teach Back   Monitoring/Evaluation:  Dietary intake, exercise, reader and breakfast meals, and body weight in 4 week(s). Norm Parcel, RD 08/15/2023 1:45 PM.

## 2023-08-15 NOTE — Patient Instructions (Signed)
Today we worked more on carbohydrate consistency  Carbohydrate foods are: starchy and sugary foods like   starchy veggies,  fruits and fruits juice,  sweets like sweet drinks, cakes, pies grains- bread, cereal, rice, pasta, crackers, breading  Milk, yogurt, pudding, ice cream  Amanda [name] [phone]

## 2023-08-15 NOTE — BH Specialist Note (Unsigned)
Integrated Behavioral Health via Telemedicine Visit  08/15/2023 ELDA HARDMON [id number]  Number of Integrated Behavioral Health Clinician visits: 1- Initial Visit  Session Start time: 1500   Session End time: 1530  Total time in minutes: 30   Referring Provider: Rudene Christians, DO Patient/Family location: Home [facility] Provider location: Office All persons participating in visit: [facility] and Patient Types of Service: Telephone visit and Introduction only  I connected with Candace Gallus and/or Moshe Salisbury Merkey's  via  Telephone or Video Enabled Telemedicine Application  (Video is Caregility application) and verified that I am speaking with the correct person using two identifiers. Discussed confidentiality: Yes   I discussed the limitations of telemedicine and the availability of in person appointments.  Discussed there is a possibility of technology failure and discussed alternative modes of communication if that failure occurs.  I discussed that engaging in this telemedicine visit, they consent to the provision of behavioral healthcare and the services will be billed under their insurance.  Patient and/or legal guardian expressed understanding and consented to Telemedicine visit: Yes   [facility] introduced self to patient on today. Reviewed services with patient and patient stated she understood. Patient prefers in person appointments. [facility] scheduled patient for 09/04 at 2:30 pm .  I discussed the assessment and treatment plan with the patient and/or parent/guardian. They were provided an opportunity to ask questions and all were answered. They agreed with the plan and demonstrated an understanding of the instructions.   They were advised to call back or seek an in-person evaluation if the symptoms worsen or if the condition fails to improve as anticipated.  Christen Butter, MSW, LCSW-A She/Her Behavioral Health Clinician [facility]  Internal Medicine Center Direct [phone]  Fax  [phone] Main Office Phone: [phone] [address] [place] [place], [place] [address] Website: [facility] Internal Medicine [facility]  [place], [place]  [place]

## 2023-08-16 ENCOUNTER — Other Ambulatory Visit (HOSPITAL_COMMUNITY): Payer: Self-pay

## 2023-08-17 ENCOUNTER — Other Ambulatory Visit: Payer: Self-pay | Admitting: Dietician

## 2023-08-17 ENCOUNTER — Other Ambulatory Visit (HOSPITAL_COMMUNITY): Payer: Self-pay

## 2023-08-17 DIAGNOSIS — E118 Type 2 diabetes mellitus with unspecified complications: Secondary | ICD-10-CM

## 2023-08-17 NOTE — Progress Notes (Signed)
Second referral in same calendar year request

## 2023-08-22 ENCOUNTER — Other Ambulatory Visit (HOSPITAL_COMMUNITY): Payer: Self-pay

## 2023-08-23 ENCOUNTER — Ambulatory Visit: Payer: Medicare Other | Admitting: Licensed Clinical Social Worker

## 2023-08-23 ENCOUNTER — Ambulatory Visit (INDEPENDENT_AMBULATORY_CARE_PROVIDER_SITE_OTHER): Payer: Medicare Other | Admitting: Licensed Clinical Social Worker

## 2023-08-23 ENCOUNTER — Other Ambulatory Visit (HOSPITAL_COMMUNITY): Payer: Self-pay

## 2023-08-23 DIAGNOSIS — Z7189 Other specified counseling: Secondary | ICD-10-CM

## 2023-08-23 NOTE — BH Specialist Note (Signed)
Patient no-showed today's appointment; appointment was for Telephone visit at 3:30Pm. Appointment was reschedule from the originally time of 2:30pm  Behavioral Health Clinician [facility] from here on out)  attempted patient via Telephone    [facility] contacted patient from telephone number [phone]. [facility] left a  VM.   Patient will need to reschedule appointment by calling Internal medicine center [phone].  Christen Butter, MSW, LCSW-A She/Her Behavioral Health Clinician [facility]  Internal Medicine Center Direct Dial:[phone]  Fax [phone] Main Office Phone: [phone] [address] [place] [place], [place] [address] Website: [facility] Internal [facility]  [place], [place]  [place]

## 2023-08-29 ENCOUNTER — Other Ambulatory Visit (HOSPITAL_COMMUNITY): Payer: Self-pay

## 2023-08-30 ENCOUNTER — Encounter: Payer: Self-pay | Admitting: Student

## 2023-08-30 ENCOUNTER — Ambulatory Visit (INDEPENDENT_AMBULATORY_CARE_PROVIDER_SITE_OTHER): Payer: Medicare Other | Admitting: Student

## 2023-08-30 ENCOUNTER — Other Ambulatory Visit: Payer: Self-pay

## 2023-08-30 ENCOUNTER — Other Ambulatory Visit (HOSPITAL_COMMUNITY): Payer: Self-pay

## 2023-08-30 VITALS — BP 127/54 | HR 73 | Temp 97.8°F | Ht 61.0 in | Wt 152.0 lb

## 2023-08-30 DIAGNOSIS — Z794 Long term (current) use of insulin: Secondary | ICD-10-CM

## 2023-08-30 DIAGNOSIS — I10 Essential (primary) hypertension: Secondary | ICD-10-CM

## 2023-08-30 DIAGNOSIS — J302 Other seasonal allergic rhinitis: Secondary | ICD-10-CM

## 2023-08-30 DIAGNOSIS — Z23 Encounter for immunization: Secondary | ICD-10-CM | POA: Diagnosis not present

## 2023-08-30 DIAGNOSIS — E118 Type 2 diabetes mellitus with unspecified complications: Secondary | ICD-10-CM | POA: Diagnosis not present

## 2023-08-30 MED ORDER — FLUTICASONE PROPIONATE 50 MCG/ACT NA SUSP
1.0000 | Freq: Every day | NASAL | 2 refills | Status: AC
Start: 2023-08-30 — End: ?
  Filled 2023-08-30 – 2023-09-06 (×2): qty 16, 60d supply, fill #0

## 2023-08-30 MED ORDER — CETIRIZINE HCL 10 MG PO TABS
10.0000 mg | ORAL_TABLET | Freq: Every day | ORAL | 0 refills | Status: DC
  Filled 2023-08-30: qty 90, 90d supply, fill #0

## 2023-08-30 NOTE — Assessment & Plan Note (Addendum)
Blood pressure is currently well-controlled at 127/54.  She is taking amlodipine 5 mg, chlorthalidone 25 mg, and olmesartan 20 mg.  She has been taking her blood pressure at home and has noticed that it has been somewhat low (90s/40s), which has concerned her.  She has not felt any lightheadedness/dizziness and has not fainted. I gave her a BP log to record her home BP to bring to her next visit at which time we can re-evaluate her BP medications.

## 2023-08-30 NOTE — Assessment & Plan Note (Addendum)
Patient reports several weeks of eye itchiness and puffiness, bilateral ear pain, slight sore throat, mild cough, and nasal congestion.  She denies any recent fevers or sick contacts.  Her symptoms are very consistent with seasonal allergies, so I have prescribed her Flonase and Zyrtec, which I expect will improve her symptoms.

## 2023-08-30 NOTE — Assessment & Plan Note (Addendum)
Prior A1c on 8/19 was 8.9.  Will recheck her A1c in 2 months.  She is currently using Guinea-[place] 8u daily, Lispro 2/2/4, and Rybelsus 14 mg. She had some microalbuminuria on her most recent labs. She is on Jardiance and olmesartan. She had a recent appointment with Lupita Leash, the diabetes educator, to discuss dietary and lifestyle changes to improve her glycemic control. She has another appointment coming up in two weeks. She denies any new symptoms including poyuria/polydipsia, blurry vision and neuropathy. It is too soon at this visit to recheck her A1c, so we will defer that until her next visit.

## 2023-08-30 NOTE — Progress Notes (Signed)
CC: itchy eyes and congestion  HPI:  [name] is a 72 y.o. female living with a history stated below and presents today for itchy eyes and congestion. Please see problem based assessment and plan for additional details.  Past Medical History:  Diagnosis Date   Allergy    SEASONAL   Arthritis    Cervical radiculopathy due to degenerative joint disease of spine 05/03/2017   CKD (chronic kidney disease) stage 3, GFR 30-59 ml/min (HCC) 01/01/2021   Diabetes mellitus 1996   Glaucoma    H. pylori infection    Hyperlipidemia    Hypertension    Normocytic anemia 02/14/2013   Osteopenia 05/04/2016   Pain of both shoulder joints 11/14/2016    Current Outpatient Medications on File Prior to Visit  Medication Sig Dispense Refill   acetaminophen (TYLENOL) 325 MG tablet Take 2 tablets (650 mg total) by mouth every 6 (six) hours as needed (or Fever >/= 101). 60 tablet 0   amLODipine (NORVASC) 5 MG tablet Take 1 tablet (5 mg total) by mouth daily. 30 tablet 11   atorvastatin (LIPITOR) 40 MG tablet Take 1 tablet (40 mg total) by mouth daily. 90 tablet 2   blood glucose meter kit and supplies KIT Dispense based on patient and insurance preference. Use up to four times daily as directed. . 1 each 0   chlorthalidone (HYGROTON) 25 MG tablet Take 1 tablet (25 mg total) by mouth daily. 90 tablet 3   Continuous Blood Gluc Receiver (DEXCOM G7 RECEIVER) DEVI Use to check blood glucose before upon waking up, meals, at bedtime, and as needed, 1 each 0   Continuous Glucose Sensor (DEXCOM G7 SENSOR) MISC Apply to back of arm as directed. Change every 10 days. 9 each 2   diclofenac Sodium (VOLTAREN) 1 % GEL Apply 2 g topically daily as needed (hand pain). 100 g 2   empagliflozin (JARDIANCE) 25 MG TABS tablet Take 1 tablet (25 mg total) by mouth daily before breakfast. 90 tablet 2   ferrous sulfate 325 (65 FE) MG tablet Take 1 tablet (325 mg total) by mouth daily. 90 tablet 3   glucose 4 GM chewable  tablet Chew 1 tablet (4 g total) by mouth as needed for low blood sugar. 50 tablet 12   glucose blood (ACCU-CHEK GUIDE) test strip You do not medically need to check blood sugar while not taking insulin. You can check your blood sugar if you are concerned when you want, but do not need to. 100 each 12   insulin degludec (TRESIBA FLEXTOUCH) 100 UNIT/ML FlexTouch Pen Inject 8 Units into the skin daily. 3 mL 9   Insulin Lispro-aabc (LYUMJEV KWIKPEN) 100 UNIT/ML KwikPen Inject 2 Units into the skin daily with breakfast AND 4 Units daily with lunch AND 4 Units daily with supper. 3 mL 12   Insulin Pen Needle (PEN NEEDLES) 32G X 4 MM MISC Use as directed. 100 each 10   latanoprost (XALATAN) 0.005 % ophthalmic solution Place 1 drop into both eyes at bedtime.     latanoprost (XALATAN) 0.005 % ophthalmic solution Place 1 drop into both eyes daily. 2.5 mL 3   Multiple Vitamins-Minerals (WOMENS 50+ MULTI VITAMIN/MIN) TABS Take 1 tablet by mouth daily. 30 tablet    olmesartan (BENICAR) 20 MG tablet Take 1 tablet (20 mg total) by mouth daily. 30 tablet 11   Semaglutide (RYBELSUS) 14 MG TABS Take 1 tablet (14 mg total) by mouth daily. 90 tablet 2   traMADol (  ULTRAM) 50 MG tablet Take 1 tablet (50 mg total) by mouth every 6 (six) hours as needed for dental pain 10 tablet 0   [DISCONTINUED] glipiZIDE (GLUCOTROL) 5 MG tablet Take 0.5 tablets (2.5 mg total) by mouth daily before breakfast. 15 tablet 0   No current facility-administered medications on file prior to visit.    Family History  Problem Relation Age of Onset   Diabetes Mother    Hypertension Mother    Breast cancer Mother    Cancer Mother    Diabetes Sister    Hypertension Sister    Kidney disease Sister        One Kidney transplant, one sister on HD   Breast cancer Sister    Cancer Sister    Hypertension Sister    Diabetes Sister    Cerebral palsy Brother    Cancer Brother        Unknown   Hypertension Daughter    Colon cancer Neg Hx     Esophageal cancer Neg Hx    Stomach cancer Neg Hx    Rectal cancer Neg Hx     Social History   Socioeconomic History   Marital status: Married    Spouse name: Not on file   Number of children: 1   Years of education: Not on file   Highest education level: Not on file  Occupational History   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      The highest level of education was high school diploma.      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   Social Determinants of Health   Financial Resource Strain: Medium Risk (07/05/2023)   Overall Financial Resource Strain (CARDIA)    Difficulty of Paying Living Expenses: Somewhat hard  Food Insecurity: No Food Insecurity (07/05/2023)   Hunger Vital Sign    Worried About Running Out of Food in the Last Year: Never true    Ran Out of Food in the Last Year: Never true  Transportation Needs: No Transportation Needs (07/05/2023)   PRAPARE - Administrator, Civil Service (Medical): No    Lack of Transportation (Non-Medical): No  Physical Activity: Inactive (07/05/2023)   Exercise Vital Sign    Days of Exercise per Week: 0 days    Minutes of Exercise per Session: 0 min  Stress: Stress Concern Present (07/05/2023)   [employer] of Occupational Health - Occupational Stress Questionnaire    Feeling of Stress : To some extent  Social Connections: Socially Integrated (07/05/2023)   Social  Connection and Isolation Panel [NHANES]    Frequency of Communication with Friends and Family: More than three times a week    Frequency of Social Gatherings with Friends and Family: More than three times a week    Attends Religious Services: More than 4 times per  year    Active Member of Clubs or Organizations: Yes    Attends Banker Meetings: 1 to 4 times per year    Marital Status: Married  Catering manager Violence: Not At Risk (07/05/2023)   Humiliation, Afraid, Rape, and Kick questionnaire    Fear of Current or Ex-Partner: No    Emotionally Abused: No    Physically Abused: No    Sexually Abused: No   Review of Systems: ROS negative except for what is noted on the assessment and plan.  Vitals:   08/30/23 1452  BP: (!) 127/54  Pulse: 73  Temp: 97.8 F (36.6 C)  TempSrc: Oral  SpO2: 100%  Weight: 152 lb (68.9 kg)  Height: 5\' 1"  (1.549 m)   Physical Exam: Constitutional: well-appearing, in no acute distress HENT: ears unremarkable bilaterally; no signs of ear infection Eyes: conjunctiva non-erythematous Cardiovascular: regular rate and rhythm, no m/r/g Pulmonary/Chest: normal work of breathing on room air, lungs clear to auscultation bilaterally Neurological: alert & oriented x 3, no focal deficit Skin: warm and dry  Assessment & Plan:   Patient seen with Dr. Criselda Peaches  Essential hypertension Blood pressure is currently well-controlled at 127/54.  She is taking amlodipine 5 mg, chlorthalidone 25 mg, and olmesartan 20 mg.  She has been taking her blood pressure at home and has noticed that it has been somewhat low (90s/40s), which has concerned her.  She has not felt any lightheadedness/dizziness and has not fainted. I gave her a BP log to record her home BP to bring to her next visit at which time we can re-evaluate her BP medications.   Type II diabetes mellitus with complication (HCC) Prior A1c on 8/19 was 8.9.  Will recheck her A1c in 2 months.  She is  currently using Guinea-B[place]au 8u daily, Lispro 2/2/4, and Rybelsus 14 mg. She had some microalbuminuria on her most recent labs. She is on Jardiance and olmesartan. She had a recent appointment with Lupita Leash, the diabetes educator, to discuss dietary and lifestyle changes to improve her glycemic control. She has another appointment coming up in two weeks. She denies any new symptoms including poyuria/polydipsia, blurry vision and neuropathy. It is too soon at this visit to recheck her A1c, so we will defer that until her next visit.   Seasonal allergies Patient reports several weeks of eye itchiness and puffiness, bilateral ear pain, slight sore throat, mild cough, and nasal congestion.  She denies any recent fevers or sick contacts.  Her symptoms are very consistent with seasonal allergies, so I have prescribed her Flonase and Zyrtec, which I expect will improve her symptoms.   Annett Fabian, MD  [facility] Internal Medicine, PGY-1 Phone: [phone] Date 08/30/2023 Time 3:48 PM

## 2023-08-30 NOTE — Patient Instructions (Signed)
Thank you, Ms.Niajah R Whatley for allowing [place] to provide your care today. Today we discussed your seasonal allergies .  For your allergies, as we discussed, please take Zyrtec 10mg  daily for the next two weeks and continue it as needed. Please also use Flonase nasal spray in each nostril once daily.  I expect these to improve your symptoms, including your ear pain.  Let us know at your next visit with Dr. Sloan Leiter in October.  For your dry skin, as Dr. Criselda Peaches suggested, you may use Aveeno or Cetaphil body lotion (with no fragrance), or you can try vasoline or oatmeal baths. These should restore the moisture in your skin.   I have ordered the following medications:   Start the following medications: Meds ordered this encounter  Medications   cetirizine (ZYRTEC ALLERGY) 10 MG tablet    Sig: Take 1 tablet (10 mg total) by mouth daily.    Dispense:  90 tablet    Refill:  0   fluticasone (FLONASE) 50 MCG/ACT nasal spray    Sig: Place 1 spray into both nostrils daily.    Dispense:  16 g    Refill:  2     Follow up: 9/24 at 11:15 am with Plyler, Cecil Cranker, RD and 10/21 at 9:45am with Dr. Sloan Leiter.   We look forward to seeing you next time. Please call our clinic at [phone] if you have any questions or concerns. The best time to call is Monday-Friday from 9am-4pm, but there is someone available 24/7. If after hours or the weekend, call the main hospital number and ask for the Internal Medicine Resident On-Call. If you need medication refills, please notify your pharmacy one week in advance and they will send [place] a request.   Thank you for trusting me with your care. Wishing you the best!   Annett Fabian, MD [facility] Internal Medicine Center

## 2023-09-06 ENCOUNTER — Encounter: Payer: Self-pay | Admitting: Podiatry

## 2023-09-06 ENCOUNTER — Other Ambulatory Visit: Payer: Self-pay | Admitting: Internal Medicine

## 2023-09-06 ENCOUNTER — Ambulatory Visit (INDEPENDENT_AMBULATORY_CARE_PROVIDER_SITE_OTHER): Payer: Medicare Other | Admitting: Podiatry

## 2023-09-06 ENCOUNTER — Other Ambulatory Visit (HOSPITAL_COMMUNITY): Payer: Self-pay

## 2023-09-06 DIAGNOSIS — M79675 Pain in left toe(s): Secondary | ICD-10-CM

## 2023-09-06 DIAGNOSIS — M79674 Pain in right toe(s): Secondary | ICD-10-CM

## 2023-09-06 DIAGNOSIS — E1142 Type 2 diabetes mellitus with diabetic polyneuropathy: Secondary | ICD-10-CM | POA: Diagnosis not present

## 2023-09-06 DIAGNOSIS — B351 Tinea unguium: Secondary | ICD-10-CM | POA: Diagnosis not present

## 2023-09-06 DIAGNOSIS — L84 Corns and callosities: Secondary | ICD-10-CM | POA: Diagnosis not present

## 2023-09-06 MED ORDER — PEN NEEDLES 32G X 4 MM MISC
10 refills | Status: DC
Start: 2023-09-06 — End: 2024-02-21
  Filled 2023-09-06: qty 100, fill #0
  Filled 2023-10-25: qty 100, 30d supply, fill #0

## 2023-09-07 ENCOUNTER — Other Ambulatory Visit (HOSPITAL_COMMUNITY): Payer: Self-pay

## 2023-09-10 NOTE — Progress Notes (Signed)
Subjective:  Patient ID: Amanda [name], female    DOB: 17-Mar-1951,  MRN: [id number]  72 y.o. female presents at risk foot care with history of diabetic neuropathy and corn(s) right foot and painful thick toenails that are difficult to trim. Painful toenails interfere with ambulation. Aggravating factors include wearing enclosed shoe gear. Pain is relieved with periodic professional debridement. Painful corns are aggravated when weightbearing when wearing enclosed shoe gear. Pain is relieved with periodic professional debridement.  New problem(s): None   PCP is [name], [place], DO , and last visit was July 05, 2023.  Allergies  Allergen Reactions   Gabapentin Other (See Comments)    Pt states medication made her feel "out of it".   Pt states medication made her feel "out of it".      Review of Systems: Negative except as noted in the HPI.   Objective:  Amanda [name] is a pleasant 72 y.o. female WD, WN in NAD.[name] AAO x 3.  Vascular Examination: Vascular status intact b/l with palpable pedal pulses. CFT immediate b/l. Pedal hair present. No edema. No pain with calf compression b/l. Skin temperature gradient WNL b/l. No varicosities noted. No cyanosis or clubbing noted.  Neurological Examination: Pt has subjective symptoms of neuropathy. Sensation grossly intact b/l with 10 gram monofilament. Vibratory sensation intact b/l.  Dermatological Examination: Pedal skin with normal turgor, texture and tone b/l. No open wounds nor interdigital macerations noted. Toenails 1-5 b/l thick, discolored, elongated with subungual debris and pain on dorsal palpation. Hyperkeratotic lesion(s) R 5th toe.  No erythema, no edema, no drainage, no fluctuance.  Musculoskeletal Examination: Muscle strength 5/5 to b/l LE.  No pain, crepitus noted b/l. No gross pedal deformities. Patient ambulates independently without assistive aids.   Radiographs: None  Last A1c:      Latest Ref Rng & Units 07/05/2023     9:59 AM 05/10/2023   10:46 AM 02/06/2023   10:53 AM 11/17/2022    3:20 PM  Hemoglobin A1C  Hemoglobin-A1c 4.0 - 5.6 % 8.9  8.9  7.6  7.7     Assessment:   1. Pain due to onychomycosis of toenails of both feet   2. Corns   3. Diabetic peripheral neuropathy associated with type 2 diabetes mellitus (HCC)    Plan:  -Consent given for treatment as described below: -Examined patient. -Patient to continue soft, supportive shoe gear daily. -Mycotic toenails 1-5 bilaterally were debrided in length and girth with sterile nail nippers and dremel without incident. -Corn(s) R 5th toe pared utilizing sterile scalpel blade without complication or incident. Total number debrided=1. -Patient/POA to call should there be question/concern in the interim.  Return in about 9 weeks (around 11/08/2023).  Freddie Breech, DPM

## 2023-09-12 ENCOUNTER — Encounter: Payer: Self-pay | Admitting: Dietician

## 2023-09-12 NOTE — Progress Notes (Signed)
 Internal Medicine Clinic Attending  I was physically present during the key portions of the resident provided service and participated in the medical decision making of patient's management care. I reviewed pertinent patient test results.  The assessment, diagnosis, and plan were formulated together and I agree with the documentation in the resident's note.  Inez Catalina, MD

## 2023-09-14 ENCOUNTER — Telehealth: Payer: Self-pay | Admitting: *Deleted

## 2023-09-14 ENCOUNTER — Other Ambulatory Visit (HOSPITAL_COMMUNITY): Payer: Self-pay

## 2023-09-14 MED ORDER — CEPHALEXIN 250 MG PO CAPS
250.0000 mg | ORAL_CAPSULE | Freq: Two times a day (BID) | ORAL | 0 refills | Status: DC
  Filled 2023-09-14: qty 6, 3d supply, fill #0

## 2023-09-14 NOTE — Telephone Encounter (Signed)
Call from patient states is having some black stools.  Which she has had in the past as she is on Iron.  Patient is also c/o of Sharp pain in her back for about 2 weeks when she turns a certain way.  Has constipation sometimes and then sometimes it is soft and then has pellets.  No nausea or vomiting was discussed.  Not doing any extra burping.  Requesting an appointment .  None available at present .  Suggestion of going to Urgent Care as well.  Patient would like to see if 1 of the doctors will call her to see what she needs to do.

## 2023-09-15 ENCOUNTER — Ambulatory Visit: Payer: Medicare Other | Admitting: Podiatry

## 2023-09-15 NOTE — Telephone Encounter (Signed)
I spoke with Candace Gallus on the phone. Patient's identity was confirmed using two patient specific identifiers. We discussed her abdominal pain and dark stools.  Hematochezia, hematemesis.  She endorses occasional lightheadedness especially in the mornings, occasional cough and shortness of breath but these seem to be persistent issues.  She says her stool became dark after taking iron.  Last iron panel reviewed and unremarkable for iron deficiency.  Patient would like to hold iron as she feels like her symptoms are related to this supplement.  I think would be reasonable to hold iron until she sees Dr. Sloan Leiter on 10/09/2023.  Strict precautions given to go to the ED if the patient has hematemesis, hematochezia, lightheadedness with feelings of faintness or presyncope, syncope, chest pain, shortness of breath, or other alarming symptoms.  Patient states that many of her issues have been chronic and going on for many months to years.  Reassured patient that she would be safe to keep her current appointment.

## 2023-09-20 ENCOUNTER — Other Ambulatory Visit (HOSPITAL_COMMUNITY): Payer: Self-pay

## 2023-09-21 ENCOUNTER — Other Ambulatory Visit (HOSPITAL_COMMUNITY): Payer: Self-pay

## 2023-09-28 NOTE — Telephone Encounter (Signed)
Spoke with the pt. She is unable to get back in her My Chart. Pt knows

## 2023-09-29 ENCOUNTER — Telehealth: Payer: Self-pay

## 2023-09-29 NOTE — Telephone Encounter (Signed)
Left message informing patient that sample is ready for pickup  Medication Samples have been provided to the patient.  Drug name: TRESIBA       Strength: U100        Qty: (5 PENS)  LOT: [id number]  Exp.Date: 10/18/25  Dosing instructions: INJECT 8 UNITS DAILY   Amanda [name] 10:43 AM 09/29/2023

## 2023-10-02 NOTE — Telephone Encounter (Signed)
Pt pick up [place]  1 box of 5 pens.

## 2023-10-04 ENCOUNTER — Other Ambulatory Visit (HOSPITAL_COMMUNITY): Payer: Self-pay

## 2023-10-04 ENCOUNTER — Telehealth: Payer: Self-pay

## 2023-10-04 NOTE — Telephone Encounter (Signed)
Mailed BI Cares renewal application to patients home.

## 2023-10-09 ENCOUNTER — Telehealth: Payer: Self-pay

## 2023-10-09 ENCOUNTER — Encounter: Payer: Self-pay | Admitting: Internal Medicine

## 2023-10-09 ENCOUNTER — Ambulatory Visit: Payer: Medicare Other | Admitting: Internal Medicine

## 2023-10-09 ENCOUNTER — Other Ambulatory Visit: Payer: Self-pay

## 2023-10-09 VITALS — BP 103/52 | HR 85 | Temp 98.0°F | Ht 61.0 in | Wt 149.6 lb

## 2023-10-09 DIAGNOSIS — E118 Type 2 diabetes mellitus with unspecified complications: Secondary | ICD-10-CM | POA: Diagnosis not present

## 2023-10-09 DIAGNOSIS — Z794 Long term (current) use of insulin: Secondary | ICD-10-CM

## 2023-10-09 DIAGNOSIS — H6993 Unspecified Eustachian tube disorder, bilateral: Secondary | ICD-10-CM | POA: Insufficient documentation

## 2023-10-09 LAB — POCT GLYCOSYLATED HEMOGLOBIN (HGB A1C): Hemoglobin A1C: 8 % — AB (ref 4.0–5.6)

## 2023-10-09 LAB — GLUCOSE, CAPILLARY: Glucose-Capillary: 101 mg/dL — ABNORMAL HIGH (ref 70–99)

## 2023-10-09 NOTE — Patient Instructions (Signed)
Thank you, Ms.Soriya R Herbel for allowing [place] to provide your care today.   Kidney health We are focused on keeping great control of your blood pressure and diabetes. Blood pressure- looked great today! Continue taking your medications. Be sure you are drinking plenty of water on your medications. Diabetes- A1c is better from 8.9 to 8. I noticed you continue to have high blood sugars after lunch. I think you need additional insulin for your lunch time meals. Please try using 5-6 units at lunch rather than 4 units. Continue following closely with Ms. Lupita Leash, she can message me with updates on how the changes are going. For now we will leave the long acting at the same dose. I will message Durward Mallard about the insulin and get back to you about that.  Feet tingling This is neuropathy. Keep daily checks on your feet. If symptoms get worse we can talk about meds in the future  Ears Eustachian tube dysfunction is what I think you may have. Please try using the nasal spray like we talked about and restart your allergy medication.  I have ordered the following labs for you:   Lab Orders         Glucose, capillary         POC Hbg A1C       I have ordered the following medication/changed the following medications:   Stop the following medications: Medications Discontinued During This Encounter  Medication Reason   ferrous sulfate 325 (65 FE) MG tablet    traMADol (ULTRAM) 50 MG tablet    cephALEXin (KEFLEX) 250 MG capsule      Start the following medications: No orders of the defined types were placed in this encounter.    Follow up:  1 month  with Lupita Leash, 3 month follow-up  We look forward to seeing you next time. Please call our clinic at [phone] if you have any questions or concerns. The best time to call is Monday-Friday from 9am-4pm, but there is someone available 24/7. If after hours or the weekend, call the main hospital number and ask for the Internal Medicine Resident On-Call. If  you need medication refills, please notify your pharmacy one week in advance and they will send [place] a request.   Thank you for trusting me with your care. Wishing you the best!   Rudene Christians, DO [facility]

## 2023-10-09 NOTE — Telephone Encounter (Signed)
Submitted RE-ENROLLMENT application for RYBELSUS & TRESIBA (new med added) to [employer] for patient assistance via online portal.   Phone: [phone]

## 2023-10-09 NOTE — Progress Notes (Signed)
Subjective:  CC: diabetes  HPI:  [name] is a 72 y.o. female with a past medical history stated below and presents today for follow-up on diabetes management. Please see problem based assessment and plan for additional details.  Past Medical History:  Diagnosis Date   Allergy    SEASONAL   Arthritis    Cervical radiculopathy due to degenerative joint disease of spine 05/03/2017   CKD (chronic kidney disease) stage 3, GFR 30-59 ml/min (HCC) 01/01/2021   Diabetes mellitus 1996   Glaucoma    H. pylori infection    Hyperlipidemia    Hypertension    Normocytic anemia 02/14/2013   Osteopenia 05/04/2016   Pain of both shoulder joints 11/14/2016    Current Outpatient Medications on File Prior to Visit  Medication Sig Dispense Refill   acetaminophen (TYLENOL) 325 MG tablet Take 2 tablets (650 mg total) by mouth every 6 (six) hours as needed (or Fever >/= 101). 60 tablet 0   amLODipine (NORVASC) 5 MG tablet Take 1 tablet (5 mg total) by mouth daily. 30 tablet 11   atorvastatin (LIPITOR) 40 MG tablet Take 1 tablet (40 mg total) by mouth daily. 90 tablet 2   blood glucose meter kit and supplies KIT Dispense based on patient and insurance preference. Use up to four times daily as directed. . 1 each 0   cetirizine (ZYRTEC ALLERGY) 10 MG tablet Take 1 tablet (10 mg total) by mouth daily. 90 tablet 0   chlorthalidone (HYGROTON) 25 MG tablet Take 1 tablet (25 mg total) by mouth daily. 90 tablet 3   Continuous Blood Gluc Receiver (DEXCOM G7 RECEIVER) DEVI Use to check blood glucose before upon waking up, meals, at bedtime, and as needed, 1 each 0   Continuous Glucose Sensor (DEXCOM G7 SENSOR) MISC Apply to back of arm as directed. Change every 10 days. 9 each 2   diclofenac Sodium (VOLTAREN) 1 % GEL Apply 2 g topically daily as needed (hand pain). 100 g 2   empagliflozin (JARDIANCE) 25 MG TABS tablet Take 1 tablet (25 mg total) by mouth daily before breakfast. 90 tablet 2    fluticasone (FLONASE) 50 MCG/ACT nasal spray Place 1 spray into both nostrils daily. 16 g 2   glucose 4 GM chewable tablet Chew 1 tablet (4 g total) by mouth as needed for low blood sugar. 50 tablet 12   glucose blood (ACCU-CHEK GUIDE) test strip You do not medically need to check blood sugar while not taking insulin. You can check your blood sugar if you are concerned when you want, but do not need to. 100 each 12   insulin degludec (TRESIBA FLEXTOUCH) 100 UNIT/ML FlexTouch Pen Inject 8 Units into the skin daily. 3 mL 9   Insulin Lispro-aabc (LYUMJEV KWIKPEN) 100 UNIT/ML KwikPen Inject 2 Units into the skin daily with breakfast AND 4 Units daily with lunch AND 4 Units daily with supper. 3 mL 12   Insulin Pen Needle (PEN NEEDLES) 32G X 4 MM MISC Use as directed. 100 each 10   latanoprost (XALATAN) 0.005 % ophthalmic solution Place 1 drop into both eyes at bedtime.     latanoprost (XALATAN) 0.005 % ophthalmic solution Place 1 drop into both eyes daily. 2.5 mL 3   Multiple Vitamins-Minerals (WOMENS 50+ MULTI VITAMIN/MIN) TABS Take 1 tablet by mouth daily. 30 tablet    olmesartan (BENICAR) 20 MG tablet Take 1 tablet (20 mg total) by mouth daily. 30 tablet 11   Semaglutide (RYBELSUS)  14 MG TABS Take 1 tablet (14 mg total) by mouth daily. 90 tablet 2   [DISCONTINUED] glipiZIDE (GLUCOTROL) 5 MG tablet Take 0.5 tablets (2.5 mg total) by mouth daily before breakfast. 15 tablet 0   No current facility-administered medications on file prior to visit.    Family History  Problem Relation Age of Onset   Diabetes Mother    Hypertension Mother    Breast cancer Mother    Cancer Mother    Diabetes Sister    Hypertension Sister    Kidney disease Sister        One Kidney transplant, one sister on HD   Breast cancer Sister    Cancer Sister    Hypertension Sister    Diabetes Sister    Cerebral palsy Brother    Cancer Brother        Unknown   Hypertension Daughter    Colon cancer Neg Hx    Esophageal  cancer Neg Hx    Stomach cancer Neg Hx    Rectal cancer Neg Hx     Social History   Socioeconomic History   Marital status: Married    Spouse name: Not on file   Number of children: 1   Years of education: Not on file   Highest education level: Not on file  Occupational History   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      The highest level of education was high school diploma.      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   Social Determinants of Health   Financial Resource Strain: Medium Risk (07/05/2023)   Overall Financial Resource Strain (CARDIA)    Difficulty of Paying Living Expenses: Somewhat hard  Food Insecurity: No Food Insecurity (07/05/2023)   Hunger Vital Sign    Worried About Running Out of Food in the Last Year: Never true    Ran Out of Food in the Last Year: Never true  Transportation Needs: No Transportation Needs (07/05/2023)   PRAPARE - Administrator, Civil Service (Medical): No    Lack of Transportation (Non-Medical): No  Physical Activity: Inactive (07/05/2023)   Exercise Vital Sign    Days of Exercise per Week: 0 days    Minutes of Exercise per Session: 0 min  Stress: Stress Concern Present (07/05/2023)   [employer] of Occupational Health - Occupational Stress Questionnaire    Feeling of Stress : To some extent  Social Connections: Socially Integrated (07/05/2023)   Social Connection and  Isolation Panel [NHANES]    Frequency of Communication with Friends and Family: More than three times a week    Frequency of Social Gatherings with Friends and Family: More than three times a week    Attends Religious Services: More than 4 times per year    Active Member of Clubs or  Organizations: Yes    Attends Banker Meetings: 1 to 4 times per year    Marital Status: Married  Catering manager Violence: Not At Risk (07/05/2023)   Humiliation, Afraid, Rape, and Kick questionnaire    Fear of Current or Ex-Partner: No    Emotionally Abused: No    Physically Abused: No    Sexually Abused: No    Review of Systems: ROS negative except for what is noted on the assessment and plan.  Objective:   Vitals:   10/09/23 1005  BP: (!) 103/52  Pulse: 85  Temp: 98 F (36.7 C)  TempSrc: Oral  SpO2: 100%  Weight: 149 lb 9.6 oz (67.9 kg)  Height: 5\' 1"  (1.549 m)    Physical Exam: Constitutional: well-appearing  HENT: ear canals are nonerythematous, TM without effusion Cardiovascular: regular rate and rhythm, no m/r/g Pulmonary/Chest: normal work of breathing on room air, lungs clear to auscultation bilaterally MSK: normal bulk and tone Skin: warm and dry   Assessment & Plan:  Type II diabetes mellitus with complication (HCC) Lab Results  Component Value Date   HGBA1C 8.0 (A) 10/09/2023   A1c improved from 8.9 to 8.  Medications include tresiba 8 units, lispro 2/4/4 units, Rybelsus 14 mg, and jardiance 25 mg.  She has CGM which we reviewed together. Her use of CGM makes data reliable. She in in range 50% of time, high 31% of time, and very high 19%. She had one day in which she had low blood sugars around midnight to 60s but she was asymptomatic.  She follows closely with Lupita Leash and they have started talking about carb counting. P: Continue tresiba 8 units Increase lunch time insulin to 5 units for next week. She is hesitant to make changes with concerns for low blood  sugars. Lispro 2/5/4 and follow-up with donna within 2-3 weeks. Continue jardiance and rybelsus  Message sent to [place] to check about pricing of insulin pens. Patient pays $70 per month for both pens and with passing of her husband, she has difficulty affording this.  Eustachian tube dysfunction, bilateral Has some pain in R>L ears for last 4 weeks. Noted with change in weather. No signs of infection within ear. P: Continue flonase spray, patient education provided for technique Restart zyrtec    Patient discussed with Dr. Wille Celeste Amanda [name], D.O. [facility]  PGY-3 Pager: [phone]  Phone: [phone] Date 10/09/2023  Time 5:21 PM

## 2023-10-09 NOTE — Assessment & Plan Note (Signed)
Lab Results  Component Value Date   HGBA1C 8.0 (A) 10/09/2023   A1c improved from 8.9 to 8.  Medications include tresiba 8 units, lispro 2/4/4 units, Rybelsus 14 mg, and jardiance 25 mg.  She has CGM which we reviewed together. Her use of CGM makes data reliable. She in in range 50% of time, high 31% of time, and very high 19%. She had one day in which she had low blood sugars around midnight to 60s but she was asymptomatic.  She follows closely with Lupita Leash and they have started talking about carb counting. P: Continue tresiba 8 units Increase lunch time insulin to 5 units for next week. She is hesitant to make changes with concerns for low blood sugars. Lispro 2/5/4 and follow-up with donna within 2-3 weeks. Continue jardiance and rybelsus  Message sent to [place] to check about pricing of insulin pens. Patient pays $70 per month for both pens and with passing of her husband, she has difficulty affording this.  Addendum: [place] has submitted patient assistance for tresiba.

## 2023-10-09 NOTE — Assessment & Plan Note (Signed)
Has some pain in R>L ears for last 4 weeks. Noted with change in weather. No signs of infection within ear. P: Continue flonase spray, patient education provided for technique Restart zyrtec

## 2023-10-11 ENCOUNTER — Other Ambulatory Visit (HOSPITAL_COMMUNITY): Payer: Self-pay

## 2023-10-11 NOTE — Progress Notes (Signed)
Internal Medicine Clinic Attending  Case discussed with the resident at the time of the visit.  We reviewed the resident's history and exam and pertinent patient test results.  I agree with the assessment, diagnosis, and plan of care documented in the resident's note.  

## 2023-10-13 ENCOUNTER — Ambulatory Visit: Payer: Medicare Other | Admitting: Podiatry

## 2023-10-18 ENCOUNTER — Other Ambulatory Visit (HOSPITAL_COMMUNITY): Payer: Self-pay

## 2023-10-20 ENCOUNTER — Ambulatory Visit (INDEPENDENT_AMBULATORY_CARE_PROVIDER_SITE_OTHER): Payer: Medicare Other

## 2023-10-20 ENCOUNTER — Ambulatory Visit (INDEPENDENT_AMBULATORY_CARE_PROVIDER_SITE_OTHER): Payer: Medicare Other | Admitting: Podiatry

## 2023-10-20 DIAGNOSIS — M778 Other enthesopathies, not elsewhere classified: Secondary | ICD-10-CM | POA: Diagnosis not present

## 2023-10-20 DIAGNOSIS — M7672 Peroneal tendinitis, left leg: Secondary | ICD-10-CM

## 2023-10-20 NOTE — Progress Notes (Signed)
Subjective:  Patient ID: Amanda [name], female    DOB: [birth year],  MRN: [id number]  Chief Complaint  Patient presents with   Foot Pain    Left ankle pain no injuries started several months ago    72 y.o. female presents with the above complaint.  Patient presents with complaint of having left lateral foot pain.  Patient states that she does not have any injury she does have uneven surfaces that she walks on.  She wanted to get it evaluated.  Pain scale 7 out of 10 dull aching nature.   Review of Systems: Negative except as noted in the HPI. Denies N/V/F/Ch.  Past Medical History:  Diagnosis Date   Allergy    SEASONAL   Arthritis    Cervical radiculopathy due to degenerative joint disease of spine 05/03/2017   CKD (chronic kidney disease) stage 3, GFR 30-59 ml/min (HCC) 01/01/2021   Diabetes mellitus 1996   Glaucoma    H. pylori infection    Hyperlipidemia    Hypertension    Normocytic anemia 02/14/2013   Osteopenia 05/04/2016   Pain of both shoulder joints 11/14/2016    Current Outpatient Medications:    acetaminophen (TYLENOL) 325 MG tablet, Take 2 tablets (650 mg total) by mouth every 6 (six) hours as needed (or Fever >/= 101)., Disp: 60 tablet, Rfl: 0   amLODipine (NORVASC) 5 MG tablet, Take 1 tablet (5 mg total) by mouth daily., Disp: 30 tablet, Rfl: 11   atorvastatin (LIPITOR) 40 MG tablet, Take 1 tablet (40 mg total) by mouth daily., Disp: 90 tablet, Rfl: 2   blood glucose meter kit and supplies KIT, Dispense based on patient and insurance preference. Use up to four times daily as directed. ., Disp: 1 each, Rfl: 0   cetirizine (ZYRTEC ALLERGY) 10 MG tablet, Take 1 tablet (10 mg total) by mouth daily., Disp: 90 tablet, Rfl: 0   chlorthalidone (HYGROTON) 25 MG tablet, Take 1 tablet (25 mg total) by mouth daily., Disp: 90 tablet, Rfl: 3   Continuous Blood Gluc Receiver (DEXCOM G7 RECEIVER) DEVI, Use to check blood glucose before upon waking up, meals, at bedtime, and as  needed,, Disp: 1 each, Rfl: 0   Continuous Glucose Sensor (DEXCOM G7 SENSOR) MISC, Apply to back of arm as directed. Change every 10 days., Disp: 9 each, Rfl: 2   diclofenac Sodium (VOLTAREN) 1 % GEL, Apply 2 g topically daily as needed (hand pain)., Disp: 100 g, Rfl: 2   empagliflozin (JARDIANCE) 25 MG TABS tablet, Take 1 tablet (25 mg total) by mouth daily before breakfast., Disp: 90 tablet, Rfl: 2   fluticasone (FLONASE) 50 MCG/ACT nasal spray, Place 1 spray into both nostrils daily., Disp: 16 g, Rfl: 2   glucose 4 GM chewable tablet, Chew 1 tablet (4 g total) by mouth as needed for low blood sugar., Disp: 50 tablet, Rfl: 12   glucose blood (ACCU-CHEK GUIDE) test strip, You do not medically need to check blood sugar while not taking insulin. You can check your blood sugar if you are concerned when you want, but do not need to., Disp: 100 each, Rfl: 12   insulin degludec (TRESIBA FLEXTOUCH) 100 UNIT/ML FlexTouch Pen, Inject 8 Units into the skin daily., Disp: 3 mL, Rfl: 9   Insulin Lispro-aabc (LYUMJEV KWIKPEN) 100 UNIT/ML KwikPen, Inject 2 Units into the skin daily with breakfast AND 4 Units daily with lunch AND 4 Units daily with supper., Disp: 3 mL, Rfl: 12   Insulin Pen Needle (  PEN NEEDLES) 32G X 4 MM MISC, Use as directed., Disp: 100 each, Rfl: 10   latanoprost (XALATAN) 0.005 % ophthalmic solution, Place 1 drop into both eyes at bedtime., Disp: , Rfl:    latanoprost (XALATAN) 0.005 % ophthalmic solution, Place 1 drop into both eyes daily., Disp: 2.5 mL, Rfl: 3   Multiple Vitamins-Minerals (WOMENS 50+ MULTI VITAMIN/MIN) TABS, Take 1 tablet by mouth daily., Disp: 30 tablet, Rfl:    olmesartan (BENICAR) 20 MG tablet, Take 1 tablet (20 mg total) by mouth daily., Disp: 30 tablet, Rfl: 11   Semaglutide (RYBELSUS) 14 MG TABS, Take 1 tablet (14 mg total) by mouth daily., Disp: 90 tablet, Rfl: 2  Social History   Tobacco Use  Smoking Status Never  Smokeless Tobacco Never    Allergies  Allergen  Reactions   Gabapentin Other (See Comments)    Pt states medication made her feel "out of it".   Pt states medication made her feel "out of it".     Objective:  There were no vitals filed for this visit. There is no height or weight on file to calculate BMI. Constitutional Well developed. Well nourished.  Vascular Dorsalis pedis pulses palpable bilaterally. Posterior tibial pulses palpable bilaterally. Capillary refill normal to all digits.  No cyanosis or clubbing noted. Pedal hair growth normal.  Neurologic Normal speech. Oriented to person, place, and time. Epicritic sensation to light touch grossly present bilaterally.  Dermatologic Nails well groomed and normal in appearance. No open wounds. No skin lesions.  Orthopedic: Pain along the course of the left peroneal tendon pain with resisted dorsiflexion eversion of foot no pain with plantarflexion inversion of the foot.  Mild pain at the insertion.   Radiographs: 3 views of skeletally mature left ankle: No fracture noted no bony abnormalities noted midfoot arthritis noted. Assessment:   1. Peroneal tendinitis of left lower extremity    Plan:  Patient was evaluated and treated and all questions answered.  Left peroneal tendinitis -All questions and concerns were discussed with the patient extensively given the amount of peroneal tendinitis is present patient benefit from cam boot immobilization Cam boot was dispensed if any foot and ankle issues in future she will come back and see me.  No follow-ups on file.

## 2023-10-24 ENCOUNTER — Other Ambulatory Visit (HOSPITAL_COMMUNITY): Payer: Self-pay

## 2023-10-24 LAB — HM DIABETES EYE EXAM

## 2023-10-25 ENCOUNTER — Other Ambulatory Visit (HOSPITAL_COMMUNITY): Payer: Self-pay

## 2023-10-25 ENCOUNTER — Other Ambulatory Visit: Payer: Self-pay

## 2023-10-26 ENCOUNTER — Encounter: Payer: Medicare Other | Admitting: Dietician

## 2023-10-26 ENCOUNTER — Telehealth: Payer: Self-pay | Admitting: Dietician

## 2023-10-26 NOTE — Telephone Encounter (Signed)
Patient wanted to change her visit today to a telephone visit but I suggested that it would be helpful to review her CGM data and she needs to come in the office for that to happen. She agreed to reschedule for 11-08-23. We discussed how her 5 unit Humalog dose (increase) at lunch is going. She said she took it one time and it was too much. She is taking 2-3 units with lunch and dinner and feels this dose is better. She states she is now taking her insulin with her  everywhere she goes eats lunch out most days) and because of that is not omitting her lunchtime insulin any more.  She states when her blood sugars are higher than desired it may be caused by deciding to eat more after taking her meal dose or snacking between meals.

## 2023-11-01 ENCOUNTER — Encounter: Payer: Self-pay | Admitting: Podiatry

## 2023-11-01 ENCOUNTER — Ambulatory Visit (INDEPENDENT_AMBULATORY_CARE_PROVIDER_SITE_OTHER): Payer: Medicare Other | Admitting: Podiatry

## 2023-11-01 VITALS — Ht 61.0 in | Wt 149.6 lb

## 2023-11-01 DIAGNOSIS — M7672 Peroneal tendinitis, left leg: Secondary | ICD-10-CM | POA: Diagnosis not present

## 2023-11-01 NOTE — Progress Notes (Signed)
Subjective:  Patient ID: Amanda [name], female    DOB: 1951-08-21,  MRN: [id number]  Chief Complaint  Patient presents with   Foot Pain    Pt is here due to left foot pain, states the boot is helping but the pain is still there.    72 y.o. female presents with the above complaint.  Patient presents with complaint of having left lateral foot pain.  She states she is doing better cam boot helped.  She still has some residual pain denies any other acute complaints  Review of Systems: Negative except as noted in the HPI. Denies N/V/F/Ch.  Past Medical History:  Diagnosis Date   Allergy    SEASONAL   Arthritis    Cervical radiculopathy due to degenerative joint disease of spine 05/03/2017   CKD (chronic kidney disease) stage 3, GFR 30-59 ml/min (HCC) 01/01/2021   Diabetes mellitus 1996   Glaucoma    H. pylori infection    Hyperlipidemia    Hypertension    Normocytic anemia 02/14/2013   Osteopenia 05/04/2016   Pain of both shoulder joints 11/14/2016    Current Outpatient Medications:    acetaminophen (TYLENOL) 325 MG tablet, Take 2 tablets (650 mg total) by mouth every 6 (six) hours as needed (or Fever >/= 101)., Disp: 60 tablet, Rfl: 0   amLODipine (NORVASC) 5 MG tablet, Take 1 tablet (5 mg total) by mouth daily., Disp: 30 tablet, Rfl: 11   atorvastatin (LIPITOR) 40 MG tablet, Take 1 tablet (40 mg total) by mouth daily., Disp: 90 tablet, Rfl: 2   blood glucose meter kit and supplies KIT, Dispense based on patient and insurance preference. Use up to four times daily as directed. ., Disp: 1 each, Rfl: 0   cetirizine (ZYRTEC ALLERGY) 10 MG tablet, Take 1 tablet (10 mg total) by mouth daily., Disp: 90 tablet, Rfl: 0   chlorthalidone (HYGROTON) 25 MG tablet, Take 1 tablet (25 mg total) by mouth daily., Disp: 90 tablet, Rfl: 3   Continuous Blood Gluc Receiver (DEXCOM G7 RECEIVER) DEVI, Use to check blood glucose before upon waking up, meals, at bedtime, and as needed,, Disp: 1 each, Rfl:  0   Continuous Glucose Sensor (DEXCOM G7 SENSOR) MISC, Apply to back of arm as directed. Change every 10 days., Disp: 9 each, Rfl: 2   diclofenac Sodium (VOLTAREN) 1 % GEL, Apply 2 g topically daily as needed (hand pain)., Disp: 100 g, Rfl: 2   empagliflozin (JARDIANCE) 25 MG TABS tablet, Take 1 tablet (25 mg total) by mouth daily before breakfast., Disp: 90 tablet, Rfl: 2   fluticasone (FLONASE) 50 MCG/ACT nasal spray, Place 1 spray into both nostrils daily., Disp: 16 g, Rfl: 2   glucose 4 GM chewable tablet, Chew 1 tablet (4 g total) by mouth as needed for low blood sugar., Disp: 50 tablet, Rfl: 12   glucose blood (ACCU-CHEK GUIDE) test strip, You do not medically need to check blood sugar while not taking insulin. You can check your blood sugar if you are concerned when you want, but do not need to., Disp: 100 each, Rfl: 12   insulin degludec (TRESIBA FLEXTOUCH) 100 UNIT/ML FlexTouch Pen, Inject 8 Units into the skin daily., Disp: 3 mL, Rfl: 9   Insulin Lispro-aabc (LYUMJEV KWIKPEN) 100 UNIT/ML KwikPen, Inject 2 Units into the skin daily with breakfast AND 4 Units daily with lunch AND 4 Units daily with supper., Disp: 3 mL, Rfl: 12   Insulin Pen Needle (PEN NEEDLES) 32G X 4  MM MISC, Use as directed., Disp: 100 each, Rfl: 10   latanoprost (XALATAN) 0.005 % ophthalmic solution, Place 1 drop into both eyes at bedtime., Disp: , Rfl:    latanoprost (XALATAN) 0.005 % ophthalmic solution, Place 1 drop into both eyes daily., Disp: 2.5 mL, Rfl: 3   Multiple Vitamins-Minerals (WOMENS 50+ MULTI VITAMIN/MIN) TABS, Take 1 tablet by mouth daily., Disp: 30 tablet, Rfl:    olmesartan (BENICAR) 20 MG tablet, Take 1 tablet (20 mg total) by mouth daily., Disp: 30 tablet, Rfl: 11   Semaglutide (RYBELSUS) 14 MG TABS, Take 1 tablet (14 mg total) by mouth daily., Disp: 90 tablet, Rfl: 2  Social History   Tobacco Use  Smoking Status Never  Smokeless Tobacco Never    Allergies  Allergen Reactions   Gabapentin  Other (See Comments)    Pt states medication made her feel "out of it".   Pt states medication made her feel "out of it".     Objective:  There were no vitals filed for this visit. Body mass index is 28.27 kg/m. Constitutional Well developed. Well nourished.  Vascular Dorsalis pedis pulses palpable bilaterally. Posterior tibial pulses palpable bilaterally. Capillary refill normal to all digits.  No cyanosis or clubbing noted. Pedal hair growth normal.  Neurologic Normal speech. Oriented to person, place, and time. Epicritic sensation to light touch grossly present bilaterally.  Dermatologic Nails well groomed and normal in appearance. No open wounds. No skin lesions.  Orthopedic: Pain along the course of the left peroneal tendon pain with resisted dorsiflexion eversion of foot no pain with plantarflexion inversion of the foot.  Mild pain at the insertion.   Radiographs: 3 views of skeletally mature left ankle: No fracture noted no bony abnormalities noted midfoot arthritis noted. Assessment:   1. Peroneal tendinitis of left lower extremity     Plan:  Patient was evaluated and treated and all questions answered.  Left peroneal tendinitis -All questions and concerns were discussed with the patient extensively -Given the patient pain reduced in cam boot immobilization patient will benefit from transition out of cam boot into regular shoes with Tri-Lock ankle brace -Given that she still some residual pain she would benefit from steroid injection I discussed the risk of rupture with her in extensive detail she states her several to proceed despite the risks -A steroid injection was performed at left lateral foot at point of maximal tenderness using 1% plain Lidocaine and 10 mg of Kenalog. This was well tolerated.   No follow-ups on file.

## 2023-11-08 ENCOUNTER — Encounter: Payer: Medicare Other | Admitting: Dietician

## 2023-11-08 ENCOUNTER — Encounter: Payer: Self-pay | Admitting: Dietician

## 2023-11-09 ENCOUNTER — Ambulatory Visit: Payer: Medicare Other | Admitting: Podiatry

## 2023-11-10 ENCOUNTER — Other Ambulatory Visit (HOSPITAL_COMMUNITY): Payer: Self-pay

## 2023-11-10 ENCOUNTER — Encounter: Payer: Self-pay | Admitting: Pharmacist

## 2023-11-10 NOTE — Progress Notes (Signed)
Pharmacy Quality Measure Review  This patient is appearing on a report for being at risk of failing the adherence measure for hypertension (ACEi/ARB) medications this calendar year.   Medication: olmesartan 20 mg Last fill date: 10/04/23 for 30 day supply  Contacted pharmacy to facilitate refills.  Catie Eppie Gibson, [name], BCACP, CPP Clinical Pharmacist [facility] [phone]

## 2023-11-13 ENCOUNTER — Other Ambulatory Visit (HOSPITAL_COMMUNITY): Payer: Self-pay

## 2023-11-17 ENCOUNTER — Other Ambulatory Visit (HOSPITAL_COMMUNITY): Payer: Self-pay

## 2023-11-17 ENCOUNTER — Other Ambulatory Visit: Payer: Self-pay | Admitting: Internal Medicine

## 2023-11-17 DIAGNOSIS — E1142 Type 2 diabetes mellitus with diabetic polyneuropathy: Secondary | ICD-10-CM

## 2023-11-20 ENCOUNTER — Other Ambulatory Visit (HOSPITAL_COMMUNITY): Payer: Self-pay

## 2023-11-20 MED ORDER — DEXCOM G7 SENSOR MISC
0 refills | Status: DC
  Filled 2023-11-20: qty 9, 90d supply, fill #0

## 2023-11-21 ENCOUNTER — Other Ambulatory Visit (HOSPITAL_COMMUNITY): Payer: Self-pay

## 2023-11-23 ENCOUNTER — Encounter: Payer: Self-pay | Admitting: Podiatry

## 2023-11-23 ENCOUNTER — Ambulatory Visit: Payer: Medicare Other | Admitting: Podiatry

## 2023-11-23 DIAGNOSIS — M79675 Pain in left toe(s): Secondary | ICD-10-CM

## 2023-11-23 DIAGNOSIS — B351 Tinea unguium: Secondary | ICD-10-CM | POA: Diagnosis not present

## 2023-11-23 DIAGNOSIS — M79674 Pain in right toe(s): Secondary | ICD-10-CM

## 2023-11-23 DIAGNOSIS — E1142 Type 2 diabetes mellitus with diabetic polyneuropathy: Secondary | ICD-10-CM

## 2023-11-24 ENCOUNTER — Ambulatory Visit (HOSPITAL_COMMUNITY)
Admission: EM | Admit: 2023-11-24 | Discharge: 2023-11-24 | Disposition: A | Discharge status: Home / Self Care | Payer: Medicare Other | Attending: Emergency Medicine | Admitting: Emergency Medicine

## 2023-11-24 ENCOUNTER — Encounter (HOSPITAL_COMMUNITY): Payer: Self-pay | Admitting: Emergency Medicine

## 2023-11-24 ENCOUNTER — Other Ambulatory Visit (HOSPITAL_COMMUNITY): Payer: Self-pay

## 2023-11-24 DIAGNOSIS — B9689 Other specified bacterial agents as the cause of diseases classified elsewhere: Secondary | ICD-10-CM

## 2023-11-24 DIAGNOSIS — R051 Acute cough: Secondary | ICD-10-CM

## 2023-11-24 DIAGNOSIS — J019 Acute sinusitis, unspecified: Secondary | ICD-10-CM | POA: Diagnosis not present

## 2023-11-24 MED ORDER — DOXYCYCLINE HYCLATE 100 MG PO CAPS
100.0000 mg | ORAL_CAPSULE | Freq: Two times a day (BID) | ORAL | 0 refills | Status: DC
  Filled 2023-11-24: qty 20, 10d supply, fill #0

## 2023-11-24 MED ORDER — DIPHENHYDRAMINE HCL 25 MG PO TABS
25.0000 mg | ORAL_TABLET | Freq: Four times a day (QID) | ORAL | 0 refills | Status: DC | PRN
  Filled 2023-11-24: qty 30, 8d supply, fill #0

## 2023-11-24 MED ORDER — BENZONATATE 100 MG PO CAPS
100.0000 mg | ORAL_CAPSULE | Freq: Three times a day (TID) | ORAL | 0 refills | Status: DC
  Filled 2023-11-24: qty 21, 7d supply, fill #0

## 2023-11-24 NOTE — Discharge Instructions (Addendum)
Take the antibiotics for sinusitis.  Take them with food to help prevent abdominal pain or gastrointestinal upset.  Use the cough medicine as needed.  I also suggest sleeping with a humidifier and taking over-the-counter Mucinex, 1200 mg a day.  You can use the Benadryl as needed for any itching.  I also suggest using hypoallergenic soaps, lotions and detergents such as Dove sensitive skin, Tide Free and clear and Lubriderm.  Return to clinic if any changes or no improvement.

## 2023-11-24 NOTE — ED Triage Notes (Signed)
Pt c/o congestion, ear pain, body aches,roof of mouth hurting, cough, and itching all over that started first of this week.

## 2023-11-24 NOTE — ED Provider Notes (Signed)
[facility]    CSN: [id number] Arrival date & time: 11/24/23  1009      History   Chief Complaint Chief Complaint  Patient presents with   Nasal Congestion    HPI Amanda [name] is a 72 y.o. female.   Patient presents to clinic for complaints of nasal congestion, sinus pain and pressure, bilateral ear pain, the roof of her mouth hurting, and a dry nonproductive cough and bodyaches.  The symptoms have been present since the beginning of the week.  She has been using Vicks vapor rub to help loosen up her congestion and it is helping a little bit.  She has also been having ongoing itching for the past few weeks.  She did go to her kidney doctor for this and reports her labs and urine looked fine.  She has been using Benadryl as needed.  She is not having any rash.  No fevers.  No abdominal pain, nausea, vomiting or diarrhea.    The history is provided by the patient and medical records.    Past Medical History:  Diagnosis Date   Allergy    SEASONAL   Arthritis    Cervical radiculopathy due to degenerative joint disease of spine 05/03/2017   CKD (chronic kidney disease) stage 3, GFR 30-59 ml/min (HCC) 01/01/2021   Diabetes mellitus 1996   Glaucoma    H. pylori infection    Hyperlipidemia    Hypertension    Normocytic anemia 02/14/2013   Osteopenia 05/04/2016   Pain of both shoulder joints 11/14/2016    Patient Active Problem List   Diagnosis Date Noted   Eustachian tube dysfunction, bilateral 10/09/2023   Grief 08/07/2023   Microalbuminuria 07/05/2023   CKD (chronic kidney disease), stage III (HCC) 05/23/2023   Peroneal tendon injury 05/13/2023   Pain in joints of unspecified hand 05/13/2023   Exertional shortness of breath 12/29/2022   Bilateral hand pain 11/24/2022   Financial difficulties 09/02/2022   Diabetic nephropathy with proteinuria (HCC) 05/18/2022   Iron deficiency anemia 12/02/2021   Peripheral artery disease (HCC) 10/28/2021   Need  for shingles vaccine 10/28/2021   Lump in lower outer quadrant of right breast 10/28/2021   CKD (chronic kidney disease), stage IV (HCC) 01/01/2021   Osteopenia 05/04/2016   MDD (major depressive disorder), recurrent episode, moderate (HCC) 02/08/2016   Pseudophakia of both eyes 07/27/2015   Constipation 09/16/2014   Preventive measure 06/03/2013   Diabetic neuropathy (HCC) 05/20/2013   Seasonal allergies 03/12/2013   Dyslipidemia 02/15/2013   Type II diabetes mellitus with complication (HCC) 02/14/2013   Essential hypertension 05/10/2010    Past Surgical History:  Procedure Laterality Date   ABDOMINAL HYSTERECTOMY     10 years ago    BREAST BIOPSY     BREAST LUMPECTOMY WITH RADIOACTIVE SEED LOCALIZATION Right 02/16/2022   Procedure: RIGHT BREAST LUMPECTOMY WITH RADIOACTIVE SEED LOCALIZATION;  Surgeon: Harriette Bouillon, MD;  Location: [facility];  Service: General;  Laterality: Right;   Cataracts     ROTATOR CUFF REPAIR     SHOULDER SURGERY     Rotator cuff tear     OB History     Gravida  1   Para  1   Term  1   Preterm      AB      Living  1      SAB      IAB      Ectopic      Multiple  Live Births               Home Medications    Prior to Admission medications   Medication Sig Start Date End Date Taking? Authorizing Provider  benzonatate (TESSALON) 100 MG capsule Take 1 capsule (100 mg total) by mouth every 8 (eight) hours. 11/24/23  Yes Rinaldo Ratel, [place] N, FNP  diphenhydrAMINE (BENADRYL) 25 MG tablet Take 1 tablet (25 mg total) by mouth every 6 (six) hours as needed. 11/24/23  Yes Rinaldo Ratel, [place] N, FNP  doxycycline (VIBRAMYCIN) 100 MG capsule Take 1 capsule (100 mg total) by mouth 2 (two) times daily. 11/24/23  Yes Rinaldo Ratel, [place] N, FNP  acetaminophen (TYLENOL) 325 MG tablet Take 2 tablets (650 mg total) by mouth every 6 (six) hours as needed (or Fever >/= 101). 01/05/13   Kristie Cowman, MD  amLODipine (NORVASC) 5 MG tablet Take 1 tablet (5 mg  total) by mouth daily. 02/06/23   [name], Katie, DO  atorvastatin (LIPITOR) 40 MG tablet Take 1 tablet (40 mg total) by mouth daily. 02/22/23   [name], MD  blood glucose meter kit and supplies KIT Dispense based on patient and insurance preference. Use up to four times daily as directed. . 09/30/19   Angelita Ingles, MD  cetirizine (ZYRTEC ALLERGY) 10 MG tablet Take 1 tablet (10 mg total) by mouth daily. 08/30/23 11/28/23  Annett Fabian, MD  chlorthalidone (HYGROTON) 25 MG tablet Take 1 tablet (25 mg total) by mouth daily. 12/15/22   Doran Stabler, DO  Continuous Blood Gluc Receiver (DEXCOM G7 RECEIVER) DEVI Use to check blood glucose before upon waking up, meals, at bedtime, and as needed, 10/12/22     Continuous Glucose Sensor (DEXCOM G7 SENSOR) MISC Apply sensor to back of arm to continuously monitor blood glucose, changing the sensor every 10 days as directed. 11/20/23   [name], Katie, DO  diclofenac Sodium (VOLTAREN) 1 % GEL Apply 2 g topically daily as needed (hand pain). 11/17/22   Quincy Simmonds, MD  empagliflozin (JARDIANCE) 25 MG TABS tablet Take 1 tablet (25 mg total) by mouth daily before breakfast. 05/13/23   Marolyn Haller, MD  fluticasone [facility]) 50 MCG/ACT nasal spray Place 1 spray into both nostrils daily. 08/30/23   Annett Fabian, MD  glucose 4 GM chewable tablet Chew 1 tablet (4 g total) by mouth as needed for low blood sugar. 02/06/23   [name], Katie, DO  glucose blood (ACCU-CHEK GUIDE) test strip You do not medically need to check blood sugar while not taking insulin. You can check your blood sugar if you are concerned when you want, but do not need to. 03/27/23   [name], Florentina Addison, DO  insulin degludec (TRESIBA FLEXTOUCH) 100 UNIT/ML FlexTouch Pen Inject 8 Units into the skin daily. 08/11/23   [name], Katie, DO  Insulin Lispro-aabc (LYUMJEV KWIKPEN) 100 UNIT/ML KwikPen Inject 2 Units into the skin daily with breakfast AND 4 Units daily with lunch AND 4 Units daily with  supper. 07/05/23   [name], Katie, DO  Insulin Pen Needle (PEN NEEDLES) 32G X 4 MM MISC Use as directed. 09/06/23   [name], Katie, DO  latanoprost (XALATAN) 0.005 % ophthalmic solution Place 1 drop into both eyes at bedtime.    [provider]  latanoprost (XALATAN) 0.005 % ophthalmic solution Place 1 drop into both eyes daily. 06/01/23     Multiple Vitamins-Minerals (WOMENS 50+ MULTI VITAMIN/MIN) TABS Take 1 tablet by mouth daily. 03/18/14   Gust Rung, DO  olmesartan (BENICAR) 20 MG  tablet Take 1 tablet (20 mg total) by mouth daily. 01/06/23   Morene Crocker, MD  Semaglutide (RYBELSUS) 14 MG TABS Take 1 tablet (14 mg total) by mouth daily. 08/04/23   Carmina Miller, DO  glipiZIDE (GLUCOTROL) 5 MG tablet Take 0.5 tablets (2.5 mg total) by mouth daily before breakfast. 10/12/20 10/28/20  Dellia Cloud, MD    Family History Family History  Problem Relation Age of Onset   Diabetes Mother    Hypertension Mother    Breast cancer Mother    Cancer Mother    Diabetes Sister    Hypertension Sister    Kidney disease Sister        One Kidney transplant, one sister on HD   Breast cancer Sister    Cancer Sister    Hypertension Sister    Diabetes Sister    Cerebral palsy Brother    Cancer Brother        Unknown   Hypertension Daughter    Colon cancer Neg Hx    Esophageal cancer Neg Hx    Stomach cancer Neg Hx    Rectal cancer Neg Hx     Social History Social History   Tobacco Use   Smoking status: Never   Smokeless tobacco: Never  Vaping Use   Vaping status: Never Used  Substance Use Topics   Alcohol use: No    Alcohol/week: 0.0 standard drinks of alcohol   Drug use: No     Allergies   Gabapentin   Review of Systems Review of Systems  Per HPI   Physical Exam Triage Vital Signs ED Triage Vitals  Encounter Vitals Group     BP 11/24/23 1101 136/79     Systolic BP Percentile --      Diastolic BP Percentile --      Pulse Rate 11/24/23 1101 (!) 108      Resp 11/24/23 1101 17     Temp 11/24/23 1101 98.7 F (37.1 C)     Temp Source 11/24/23 1101 Oral     SpO2 11/24/23 1101 96 %     Weight --      Height --      Head Circumference --      Peak Flow --      Pain Score 11/24/23 1104 7     Pain Loc --      Pain Education --      Exclude from Growth Chart --    No data found.  Updated Vital Signs BP 136/79 (BP Location: Right Arm)   Pulse (!) 108   Temp 98.7 F (37.1 C) (Oral)   Resp 17   SpO2 96%   Visual Acuity Right Eye Distance:   Left Eye Distance:   Bilateral Distance:    Right Eye Near:   Left Eye Near:    Bilateral Near:     Physical Exam Vitals and nursing note reviewed.  Constitutional:      Appearance: Normal appearance.  HENT:     Head: Normocephalic and atraumatic.     Right Ear: Tympanic membrane, ear canal and external ear normal.     Left Ear: Tympanic membrane, ear canal and external ear normal.     Nose: Congestion and rhinorrhea present.     Right Sinus: Maxillary sinus tenderness and frontal sinus tenderness present.     Left Sinus: Maxillary sinus tenderness and frontal sinus tenderness present.     Mouth/Throat:     Mouth: Mucous membranes are moist.  Pharynx: No posterior oropharyngeal erythema.  Eyes:     Conjunctiva/sclera: Conjunctivae normal.  Cardiovascular:     Rate and Rhythm: Normal rate and regular rhythm.     Heart sounds: Normal heart sounds. No murmur heard. Pulmonary:     Effort: Pulmonary effort is normal. No respiratory distress.     Breath sounds: Normal breath sounds.  Musculoskeletal:        General: Normal range of motion.  Skin:    General: Skin is warm and dry.  Neurological:     General: No focal deficit present.     Mental Status: She is alert.  Psychiatric:        Mood and Affect: Mood normal.        Behavior: Behavior is cooperative.      UC Treatments / Results  Labs (all labs ordered are listed, but only abnormal results are displayed) Labs Reviewed  - No data to display  EKG   Radiology No results found.  Procedures Procedures (including critical care time)  Medications Ordered in UC Medications - No data to display  Initial Impression / Assessment and Plan / UC Course  I have reviewed the triage vital signs and the nursing notes.  Pertinent labs & imaging results that were available during my care of the patient were reviewed by me and considered in my medical decision making (see chart for details).  Vitals and triage reviewed, patient is hemodynamically stable.  Lungs are vesicular, heart with regular rate and rhythm.  Bilateral maxillary and frontal sinus tenderness to palpation, increased in sinus pressure with bending over and pain to the roof of her mouth consistent with acute bacterial sinusitis, will treat with doxycycline.  Symptomatic management for congestion discussed.  Discussed itching may be due to CKD, no obvious rash visualized.  Encouraged hypoallergenic soaps, detergents and lotions to see if this will help.  As needed Benadryl.  Plan of care, follow-up care and return precautions given, no questions at this time.     Final Clinical Impressions(s) / UC Diagnoses   Final diagnoses:  Acute bacterial sinusitis  Acute cough     Discharge Instructions      Take the antibiotics for sinusitis.  Take them with food to help prevent abdominal pain or gastrointestinal upset.  Use the cough medicine as needed.  I also suggest sleeping with a humidifier and taking over-the-counter Mucinex, 1200 mg a day.  You can use the Benadryl as needed for any itching.  I also suggest using hypoallergenic soaps, lotions and detergents such as Dove sensitive skin, Tide Free and clear and Lubriderm.  Return to clinic if any changes or no improvement.      ED Prescriptions     Medication Sig Dispense Auth. Provider   doxycycline (VIBRAMYCIN) 100 MG capsule Take 1 capsule (100 mg total) by mouth 2 (two) times daily. 20 capsule  Rinaldo Ratel, [place] N, [place]   diphenhydrAMINE (BENADRYL) 25 MG tablet Take 1 tablet (25 mg total) by mouth every 6 (six) hours as needed. 30 tablet Rinaldo Ratel, [place] N, [place]   benzonatate (TESSALON) 100 MG capsule Take 1 capsule (100 mg total) by mouth every 8 (eight) hours. 21 capsule Amanda [name], [place] N, [place]      PDMP not reviewed this encounter.   Amanda [name], [place] N, [place] 11/24/23 1131

## 2023-11-26 NOTE — Progress Notes (Signed)
  Subjective:  Patient ID: Amanda [name], female    DOB: 05-27-1951,  MRN: [id number]  72 y.o. female presents at risk foot care with history of diabetic neuropathy and corn(s) right foot and painful thick toenails that are difficult to trim. Painful toenails interfere with ambulation. Aggravating factors include wearing enclosed shoe gear. Pain is relieved with periodic professional debridement. Painful corns are aggravated when weightbearing when wearing enclosed shoe gear. Pain is relieved with periodic professional debridement.  She recently saw Dr. Allena Katz for left lower extremity peroneal tendinitis, states her pain has resolved and that she is doing great, ambulating with ankle brace.  New problem(s): None   PCP is [name], [place], DO , and last visit was July 05, 2023.  Allergies  Allergen Reactions   Gabapentin Other (See Comments)    Pt states medication made her feel "out of it".   Pt states medication made her feel "out of it".      Review of Systems: Negative except as noted in the HPI.   Objective:  Amanda [name] is a pleasant 72 y.o. female WD, WN in NAD.[name] AAO x 3.  Vascular Examination: Vascular status intact b/l with palpable pedal pulses. CFT immediate b/l. Pedal hair present. No edema. No pain with calf compression b/l. Skin temperature gradient WNL b/l. No varicosities noted. No cyanosis or clubbing noted.  Neurological Examination: Pt has subjective symptoms of neuropathy. Sensation grossly intact b/l with 10 gram monofilament. Vibratory sensation intact b/l.  Dermatological Examination: Pedal skin with normal turgor, texture and tone b/l. No open wounds nor interdigital macerations noted. Toenails 1-5 b/l thick, discolored, elongated with subungual debris and pain on dorsal palpation.   Musculoskeletal Examination: Muscle strength 5/5 to b/l LE.  No pain, crepitus noted b/l. No gross pedal deformities. Patient ambulates independently without assistive aids.    Radiographs: None  Last A1c:      Latest Ref Rng & Units 10/09/2023   10:10 AM 07/05/2023    9:59 AM 05/10/2023   10:46 AM 02/06/2023   10:53 AM  Hemoglobin A1C  Hemoglobin-A1c 4.0 - 5.6 % 8.0  8.9  8.9  7.6     Assessment:   1. Pain due to onychomycosis of toenails of both feet   2. Diabetic peripheral neuropathy associated with type 2 diabetes mellitus (HCC)    Plan:  -Consent given for treatment as described below: -Examined patient. -Patient to continue soft, supportive shoe gear daily. -Mycotic toenails 1-5 bilaterally were debrided in length and girth with sterile nail nippers and dremel without incident. -Patient/POA to call should there be question/concern in the interim.  Return in about 9 weeks (around 01/25/2024) for Diabetic Foot Care.  Barbaraann Share, DPM

## 2023-11-27 ENCOUNTER — Other Ambulatory Visit (HOSPITAL_COMMUNITY): Payer: Self-pay

## 2023-12-04 ENCOUNTER — Encounter: Payer: Medicare Other | Admitting: Dietician

## 2023-12-07 ENCOUNTER — Encounter: Payer: Self-pay | Admitting: Internal Medicine

## 2023-12-07 NOTE — Progress Notes (Unsigned)
CC: urgent care follow up  HPI:  Amanda [name] is a 72 y.o. with medical history of HTN, HLD, DMII, CKDIIIb, eustachian tube dysfunction presenting to [facility] for urgent care follow up for bacterial sinusitis.   Please see problem-based list for further details, assessments, and plans.  Past Medical History:  Diagnosis Date   Allergy    SEASONAL   Arthritis    Cervical radiculopathy due to degenerative joint disease of spine 05/03/2017   CKD (chronic kidney disease) stage 3, GFR 30-59 ml/min (HCC) 01/01/2021   Diabetes mellitus 1996   Diabetic neuropathy (HCC) 05/20/2013   Glaucoma    H. pylori infection    Hyperlipidemia    Hypertension    Normocytic anemia 02/14/2013   Osteopenia 05/04/2016   Pain of both shoulder joints 11/14/2016    Current Outpatient Medications (Endocrine & Metabolic):    empagliflozin (JARDIANCE) 25 MG TABS tablet, Take 1 tablet (25 mg total) by mouth daily before breakfast.   glucose 4 GM chewable tablet, Chew 1 tablet (4 g total) by mouth as needed for low blood sugar.   insulin degludec (TRESIBA FLEXTOUCH) 100 UNIT/ML FlexTouch Pen, Inject 8 Units into the skin daily.   Insulin Lispro-aabc (LYUMJEV KWIKPEN) 100 UNIT/ML KwikPen, Inject 2 Units into the skin daily with breakfast AND 4 Units daily with lunch AND 4 Units daily with supper.   Semaglutide (RYBELSUS) 14 MG TABS, Take 1 tablet (14 mg total) by mouth daily.  Current Outpatient Medications (Cardiovascular):    amLODipine (NORVASC) 5 MG tablet, Take 1 tablet (5 mg total) by mouth daily.   atorvastatin (LIPITOR) 40 MG tablet, Take 1 tablet (40 mg total) by mouth daily.   chlorthalidone (HYGROTON) 25 MG tablet, Take 1 tablet (25 mg total) by mouth daily.   olmesartan (BENICAR) 20 MG tablet, Take 1 tablet (20 mg total) by mouth daily.  Current Outpatient Medications (Respiratory):    benzonatate (TESSALON) 100 MG capsule, Take 1 capsule (100 mg total) by mouth every 8 (eight) hours.    cetirizine (ZYRTEC ALLERGY) 10 MG tablet, Take 1 tablet (10 mg total) by mouth daily.   diphenhydrAMINE (BENADRYL) 25 MG tablet, Take 1 tablet (25 mg total) by mouth every 6 (six) hours as needed.   fluticasone (FLONASE) 50 MCG/ACT nasal spray, Place 1 spray into both nostrils daily.  Current Outpatient Medications (Analgesics):    acetaminophen (TYLENOL) 325 MG tablet, Take 2 tablets (650 mg total) by mouth every 6 (six) hours as needed (or Fever >/= 101).   Current Outpatient Medications (Other):    blood glucose meter kit and supplies KIT, Dispense based on patient and insurance preference. Use up to four times daily as directed. .   Continuous Blood Gluc Receiver (DEXCOM G7 RECEIVER) DEVI, Use to check blood glucose before upon waking up, meals, at bedtime, and as needed,   Continuous Glucose Sensor (DEXCOM G7 SENSOR) MISC, Apply sensor to back of arm to continuously monitor blood glucose, changing the sensor every 10 days as directed.   diclofenac Sodium (VOLTAREN) 1 % GEL, Apply 2 g topically daily as needed (hand pain).   doxycycline (VIBRAMYCIN) 100 MG capsule, Take 1 capsule (100 mg total) by mouth 2 (two) times daily.   glucose blood (ACCU-CHEK GUIDE) test strip, You do not medically need to check blood sugar while not taking insulin. You can check your blood sugar if you are concerned when you want, but do not need to.   Insulin Pen Needle (PEN NEEDLES) 32G  X 4 MM MISC, Use as directed.   latanoprost (XALATAN) 0.005 % ophthalmic solution, Place 1 drop into both eyes at bedtime.   latanoprost (XALATAN) 0.005 % ophthalmic solution, Place 1 drop into both eyes daily.   Multiple Vitamins-Minerals (WOMENS 50+ MULTI VITAMIN/MIN) TABS, Take 1 tablet by mouth daily.  Review of Systems:  Review of system negative unless stated in the problem list or HPI.    Physical Exam:  Vitals:   12/08/23 0853  BP: 132/81  Pulse: 88  Temp: 97.8 F (36.6 C)  TempSrc: Oral  Weight: 145 lb 8 oz (66  kg)  Height: 5\' 1"  (1.549 m)   Physical Exam General: NAD HENT: NCAT Lungs: CTAB, no wheeze, rhonchi or rales.  Cardiovascular: Normal heart sounds, no r/m/g, 2+ pulses in all extremities. No LE edema Abdomen: No TTP, normal bowel sounds MSK: No asymmetry or muscle atrophy.  Skin: no lesions noted on exposed skin Neuro: Alert and oriented x4. CN grossly intact Psych: Normal mood and normal affect   Assessment & Plan:   No problem-specific Assessment & Plan notes found for this encounter.   See Encounters Tab for problem based charting.  Patient Discussed with Dr. {NAMES:[id number]::"Guilloud","Hoffman","Mullen","Narendra","Vincent","Machen","Lau","Hatcher","Williams"} [name], MD Eligha Bridegroom. [facility], PGY-3   HTN Amlodipine 5 mg, olmesartan 40 mg every day, and Chlorthalidone 25 mg every day. Last BMP showed elevated Creatinine 2.11, was to repeat BMP in 6 weeks per nephrology. 1.8 GFR 30  DMII On farxiga 25 mg every day, rybelsus 14 mg every day, insulin 8 units daily with lispro 2 units with breakfast and 4 units with lunch and dinner. A1c 8.0 2 months ago.   Bacterial sinusitis: 12/06: went to urgent care. given doxycycline 100 mg BID for 10 days. Decreased appetite, fatigue, congestion, sinus pressure, chills.   Itching started after taking doxycylcine. Using unscented lotions. Has not taking benadryl or other anti-itch.

## 2023-12-08 ENCOUNTER — Other Ambulatory Visit (HOSPITAL_COMMUNITY): Payer: Self-pay

## 2023-12-08 ENCOUNTER — Telehealth: Payer: Self-pay

## 2023-12-08 ENCOUNTER — Ambulatory Visit (INDEPENDENT_AMBULATORY_CARE_PROVIDER_SITE_OTHER): Payer: Medicare Other | Admitting: Internal Medicine

## 2023-12-08 ENCOUNTER — Encounter (HOSPITAL_COMMUNITY): Payer: Self-pay

## 2023-12-08 VITALS — BP 132/81 | HR 88 | Temp 97.8°F | Ht 61.0 in | Wt 145.5 lb

## 2023-12-08 DIAGNOSIS — L299 Pruritus, unspecified: Secondary | ICD-10-CM | POA: Diagnosis not present

## 2023-12-08 DIAGNOSIS — J329 Chronic sinusitis, unspecified: Secondary | ICD-10-CM

## 2023-12-08 DIAGNOSIS — I10 Essential (primary) hypertension: Secondary | ICD-10-CM | POA: Diagnosis not present

## 2023-12-08 DIAGNOSIS — Z7984 Long term (current) use of oral hypoglycemic drugs: Secondary | ICD-10-CM

## 2023-12-08 DIAGNOSIS — E1151 Type 2 diabetes mellitus with diabetic peripheral angiopathy without gangrene: Secondary | ICD-10-CM

## 2023-12-08 DIAGNOSIS — B9689 Other specified bacterial agents as the cause of diseases classified elsewhere: Secondary | ICD-10-CM

## 2023-12-08 DIAGNOSIS — E118 Type 2 diabetes mellitus with unspecified complications: Secondary | ICD-10-CM | POA: Diagnosis not present

## 2023-12-08 MED ORDER — HYDROXYZINE HCL 50 MG PO TABS
50.0000 mg | ORAL_TABLET | Freq: Three times a day (TID) | ORAL | 0 refills | Status: AC | PRN
  Filled 2023-12-08: qty 30, 10d supply, fill #0

## 2023-12-08 MED ORDER — SEMAGLUTIDE (1 MG/DOSE) 4 MG/3ML ~~LOC~~ SOPN
1.0000 mg | PEN_INJECTOR | SUBCUTANEOUS | 0 refills | Status: DC
  Filled 2023-12-08: qty 3, 28d supply, fill #0

## 2023-12-08 MED ORDER — HYDROCORTISONE 1 % EX LOTN
1.0000 | TOPICAL_LOTION | Freq: Two times a day (BID) | CUTANEOUS | 0 refills | Status: DC
  Filled 2023-12-08: qty 114, 11d supply, fill #0

## 2023-12-08 NOTE — Telephone Encounter (Signed)
RTC to patient message left that Clinics had returned her call. °

## 2023-12-08 NOTE — Telephone Encounter (Signed)
Pt is  requesting a  call back  .Amanda [name] She stated that the  pharmacy told her that Ozempic  is ready to  be picked up but at her visit this morning with [name] he did not say he was giving her another insulin  .Amanda KitchenMarland [name]

## 2023-12-08 NOTE — Patient Instructions (Signed)
Ms.Lesslie R Manson Passey, it was a pleasure seeing you today! You endorsed feeling well today. Below are some of the things we talked about this visit. We look forward to seeing you in the follow up appointment!  Today we discussed: Your URI symptoms have resolved. Continue zyrtec daily, flonase daily and start using neti pot daily.  For your diabetes medications, I will try to switch you over to an injection. Continue taking your rybelsus for now.   For your itching, the zyrtec will help, continue Mositurizing and I will send in a lotion that can help as well.   Continue following with your kidney doctor. For now you can cut your chlorthalidone tablet in half. If your blood pressure is over 140/80, call our office.     I have ordered the following labs today:  Lab Orders  No laboratory test(s) ordered today      Referrals ordered today:   Referral Orders  No referral(s) requested today     I have ordered the following medication/changed the following medications:   Stop the following medications: Medications Discontinued During This Encounter  Medication Reason   doxycycline (VIBRAMYCIN) 100 MG capsule Completed Course   Semaglutide (RYBELSUS) 14 MG TABS Change in therapy   diphenhydrAMINE (BENADRYL) 25 MG tablet Duplicate     Start the following medications: Meds ordered this encounter  Medications   Semaglutide, 1 MG/DOSE, 4 MG/3ML SOPN    Sig: Inject 1 mg into the skin once a week for 28 days.    Dispense:  3 mL    Refill:  0   hydrOXYzine (ATARAX) 50 MG tablet    Sig: Take 1 tablet (50 mg total) by mouth 3 (three) times daily as needed.    Dispense:  30 tablet    Refill:  0   hydrocortisone 1 % lotion    Sig: Apply 1 Application topically 2 (two) times daily.    Dispense:  118 mL    Refill:  0     Follow-up: 1 month follow up with Dr. Sloan Leiter   Please make sure to arrive 15 minutes prior to your next appointment. If you arrive late, you may be asked to  reschedule.   We look forward to seeing you next time. Please call our clinic at [phone] if you have any questions or concerns. The best time to call is Monday-Friday from 9am-4pm, but there is someone available 24/7. If after hours or the weekend, call the main hospital number and ask for the Internal Medicine Resident On-Call. If you need medication refills, please notify your pharmacy one week in advance and they will send [place] a request.  Thank you for letting us take part in your care. Wishing you the best!  Thank you, [name], MD

## 2023-12-09 DIAGNOSIS — L299 Pruritus, unspecified: Secondary | ICD-10-CM

## 2023-12-09 DIAGNOSIS — B9689 Other specified bacterial agents as the cause of diseases classified elsewhere: Secondary | ICD-10-CM | POA: Insufficient documentation

## 2023-12-09 DIAGNOSIS — J329 Chronic sinusitis, unspecified: Secondary | ICD-10-CM | POA: Insufficient documentation

## 2023-12-09 HISTORY — DX: Pruritus, unspecified: L29.9

## 2023-12-09 HISTORY — DX: Other specified bacterial agents as the cause of diseases classified elsewhere: B96.89

## 2023-12-09 NOTE — Assessment & Plan Note (Signed)
Pt has hx of DMII. She is on farxiga 25 mg every day, rybelsus 14 mg every day, insulin 8 units daily with lispro 2 units with breakfast and 4 units with lunch and dinner. A1c 8.0 2 months ago. She continues to work on lifestyle changes. Will defer major changes to her diabetic regimen until her PCP follow up next month when she will get an A1c. She is on Rybelsus and is asking for samples and states she gets samples often for this. I believe we can change her to Ozempic. Pt is agreeable. Will start at 1 mg weekly.

## 2023-12-09 NOTE — Assessment & Plan Note (Signed)
Pt reports diffuse itching since starting doxycycline. She reports using unscented lotions. She reports using benadryl once with relief. Ddx include mild drug reaction vs 2/2 to uremia vs dry skin. Advised pt to continue moisturizing her skin, and taking her zyrtec which she is not doing. No signs of rash on skin. Will give hydrocortisone lotion for symptomatic relief and hydroxyzine prn.

## 2023-12-09 NOTE — Assessment & Plan Note (Signed)
Pt presented for a follow up on urgent care visit for bacterial sinusitis. She went to urgent care on 12/06 and was given doxycycline 100 mg BID for 10 days. Her symptoms were decreased appetite, fatigue, congestion, sinus pressure, chills which have resolved. Pt only completed 6 days of antibiotics.

## 2023-12-09 NOTE — Assessment & Plan Note (Signed)
PT with hx of HTN currently on amlodipine 5 mg, olmesartan 40 mg every day, and Chlorthalidone 25 mg every day. Last BMP showed elevated Creatinine 2.11, was to repeat BMP in 6 weeks per nephrology. Recent BMP shows 1.8 GFR 30 which appears close to baseline. Pt brought a list of home BP which show softer BP around 90s/60s. She will benefit from decreasing her chlorthalidone to 12.5 mg. She has hx of orthostatic hypotension.

## 2023-12-09 NOTE — Assessment & Plan Note (Deleted)
Pt has hx of DMII. She is on farxiga 25 mg every day, rybelsus 14 mg every day, insulin 8 units daily with lispro 2 units with breakfast and 4 units with lunch and dinner. A1c 8.0 2 months ago. She continues to work on lifestyle changes. Will defer major changes to her diabetic regimen until her PCP follow up next month when she will get an A1c. She is on Rybelsus and is asking for samples and states she gets samples often for this. I believe we can change her to Ozempic. Pt is agreeable. Will start at 1 mg weekly.

## 2023-12-11 ENCOUNTER — Other Ambulatory Visit (HOSPITAL_COMMUNITY): Payer: Self-pay

## 2023-12-11 NOTE — Progress Notes (Signed)
Internal Medicine Clinic Attending  Case discussed with the resident at the time of the visit.  We reviewed the resident's history and exam and pertinent patient test results.  I agree with the assessment, diagnosis, and plan of care documented in the resident's note.  

## 2023-12-18 ENCOUNTER — Other Ambulatory Visit (HOSPITAL_COMMUNITY): Payer: Self-pay

## 2023-12-19 ENCOUNTER — Other Ambulatory Visit (HOSPITAL_COMMUNITY): Payer: Self-pay

## 2023-12-19 MED ORDER — LATANOPROST 0.005 % OP SOLN
1.0000 [drp] | Freq: Every day | OPHTHALMIC | 11 refills | Status: AC
  Filled 2023-12-19: qty 2.5, 25d supply, fill #0
  Filled 2024-01-26: qty 2.5, 25d supply, fill #1
  Filled 2024-02-16: qty 2.5, 25d supply, fill #2
  Filled 2024-03-15: qty 2.5, 25d supply, fill #3
  Filled 2024-04-16: qty 2.5, 25d supply, fill #4
  Filled 2024-05-27: qty 2.5, 25d supply, fill #5
  Filled 2024-07-31: qty 2.5, 25d supply, fill #6
  Filled 2024-08-09: qty 2.5, 25d supply, fill #7
  Filled 2024-08-21: qty 2.5, 25d supply, fill #8
  Filled 2024-10-08: qty 2.5, 25d supply, fill #9

## 2023-12-19 NOTE — Telephone Encounter (Signed)
 Received notification from [employer] regarding approval for OZEMPIC & TRESIBA. Patient assistance approved until 12/14/24.  Medication will ship to [facility]  Company phone: [phone]

## 2023-12-21 ENCOUNTER — Other Ambulatory Visit: Payer: Self-pay | Admitting: Internal Medicine

## 2023-12-21 ENCOUNTER — Other Ambulatory Visit (HOSPITAL_COMMUNITY): Payer: Self-pay

## 2023-12-21 DIAGNOSIS — Z1231 Encounter for screening mammogram for malignant neoplasm of breast: Secondary | ICD-10-CM

## 2024-01-08 ENCOUNTER — Other Ambulatory Visit: Payer: Self-pay | Admitting: Internal Medicine

## 2024-01-08 ENCOUNTER — Other Ambulatory Visit (HOSPITAL_COMMUNITY): Payer: Self-pay

## 2024-01-08 DIAGNOSIS — E118 Type 2 diabetes mellitus with unspecified complications: Secondary | ICD-10-CM

## 2024-01-09 ENCOUNTER — Other Ambulatory Visit: Payer: Self-pay

## 2024-01-09 ENCOUNTER — Other Ambulatory Visit (HOSPITAL_COMMUNITY): Payer: Self-pay

## 2024-01-09 ENCOUNTER — Ambulatory Visit (INDEPENDENT_AMBULATORY_CARE_PROVIDER_SITE_OTHER): Payer: Medicare Other | Admitting: Internal Medicine

## 2024-01-09 ENCOUNTER — Encounter: Payer: Self-pay | Admitting: Internal Medicine

## 2024-01-09 VITALS — BP 135/66 | HR 87 | Temp 97.9°F | Ht 61.0 in | Wt 143.3 lb

## 2024-01-09 DIAGNOSIS — N184 Chronic kidney disease, stage 4 (severe): Secondary | ICD-10-CM | POA: Diagnosis not present

## 2024-01-09 DIAGNOSIS — E1122 Type 2 diabetes mellitus with diabetic chronic kidney disease: Secondary | ICD-10-CM

## 2024-01-09 DIAGNOSIS — E118 Type 2 diabetes mellitus with unspecified complications: Secondary | ICD-10-CM | POA: Diagnosis not present

## 2024-01-09 DIAGNOSIS — S86302D Unspecified injury of muscle(s) and tendon(s) of peroneal muscle group at lower leg level, left leg, subsequent encounter: Secondary | ICD-10-CM

## 2024-01-09 DIAGNOSIS — L299 Pruritus, unspecified: Secondary | ICD-10-CM | POA: Diagnosis not present

## 2024-01-09 DIAGNOSIS — Z1231 Encounter for screening mammogram for malignant neoplasm of breast: Secondary | ICD-10-CM

## 2024-01-09 DIAGNOSIS — I129 Hypertensive chronic kidney disease with stage 1 through stage 4 chronic kidney disease, or unspecified chronic kidney disease: Secondary | ICD-10-CM | POA: Diagnosis not present

## 2024-01-09 DIAGNOSIS — E785 Hyperlipidemia, unspecified: Secondary | ICD-10-CM

## 2024-01-09 DIAGNOSIS — I10 Essential (primary) hypertension: Secondary | ICD-10-CM

## 2024-01-09 DIAGNOSIS — J302 Other seasonal allergic rhinitis: Secondary | ICD-10-CM

## 2024-01-09 DIAGNOSIS — E1121 Type 2 diabetes mellitus with diabetic nephropathy: Secondary | ICD-10-CM

## 2024-01-09 DIAGNOSIS — S86302A Unspecified injury of muscle(s) and tendon(s) of peroneal muscle group at lower leg level, left leg, initial encounter: Secondary | ICD-10-CM

## 2024-01-09 LAB — POCT GLYCOSYLATED HEMOGLOBIN (HGB A1C): Hemoglobin A1C: 7.3 % — AB (ref 4.0–5.6)

## 2024-01-09 LAB — GLUCOSE, CAPILLARY: Glucose-Capillary: 104 mg/dL — ABNORMAL HIGH (ref 70–99)

## 2024-01-09 MED ORDER — HYDROCORTISONE 1 % EX LOTN
1.0000 | TOPICAL_LOTION | Freq: Two times a day (BID) | CUTANEOUS | 2 refills | Status: DC
  Filled 2024-01-09: qty 114, 11d supply, fill #0

## 2024-01-09 MED ORDER — SEMAGLUTIDE (1 MG/DOSE) 4 MG/3ML ~~LOC~~ SOPN
1.0000 mg | PEN_INJECTOR | SUBCUTANEOUS | 3 refills | Status: DC
  Filled 2024-01-09: qty 3, 28d supply, fill #0

## 2024-01-09 NOTE — Assessment & Plan Note (Signed)
Current medications include amlodipine 5 mg, olmesartan 40 mg daily, and chlorthalidone 25 mg daily.  Pressure is well-controlled at 135/66 today.  She brings in home readings.  Diastolic consistently in the 60s to 80s.  She did have 1 or 2 episodes in 4 days but she did not have dizziness or symptoms that day. P: Continue current dosing of medications Repeat BMP

## 2024-01-09 NOTE — Assessment & Plan Note (Signed)
Continues to follow with podiatry for peroneal tendinitis.  She request continuation of handicap placard.

## 2024-01-09 NOTE — Progress Notes (Addendum)
Subjective:  CC: Follow-up  HPI:  [name] is a 73 y.o. female with a past medical history of insulin dependent diabetes, CKD stage IV, and  who presents today for follow-up on diabetes and hypertension.   Please see problem based assessment and plan for additional details.  Past Medical History:  Diagnosis Date   Allergy    SEASONAL   Arthritis    Cervical radiculopathy due to degenerative joint disease of spine 05/03/2017   CKD (chronic kidney disease) stage 3, GFR 30-59 ml/min (HCC) 01/01/2021   Diabetes mellitus 1996   Diabetic neuropathy (HCC) 05/20/2013   Glaucoma    H. pylori infection    Hyperlipidemia    Hypertension    Normocytic anemia 02/14/2013   Osteopenia 05/04/2016   Pain of both shoulder joints 11/14/2016    Current Outpatient Medications on File Prior to Visit  Medication Sig Dispense Refill   acetaminophen (TYLENOL) 325 MG tablet Take 2 tablets (650 mg total) by mouth every 6 (six) hours as needed (or Fever >/= 101). 60 tablet 0   amLODipine (NORVASC) 5 MG tablet Take 1 tablet (5 mg total) by mouth daily. 30 tablet 11   atorvastatin (LIPITOR) 40 MG tablet Take 1 tablet (40 mg total) by mouth daily. 90 tablet 2   benzonatate (TESSALON) 100 MG capsule Take 1 capsule (100 mg total) by mouth every 8 (eight) hours. 21 capsule 0   blood glucose meter kit and supplies KIT Dispense based on patient and insurance preference. Use up to four times daily as directed. . 1 each 0   cetirizine (ZYRTEC ALLERGY) 10 MG tablet Take 1 tablet (10 mg total) by mouth daily. 90 tablet 0   chlorthalidone (HYGROTON) 25 MG tablet Take 1 tablet (25 mg total) by mouth daily. 90 tablet 3   Continuous Blood Gluc Receiver (DEXCOM G7 RECEIVER) DEVI Use to check blood glucose before upon waking up, meals, at bedtime, and as needed, 1 each 0   Continuous Glucose Sensor (DEXCOM G7 SENSOR) MISC Apply sensor to back of arm to continuously monitor blood glucose, changing the sensor  every 10 days as directed. 9 each 0   diclofenac Sodium (VOLTAREN) 1 % GEL Apply 2 g topically daily as needed (hand pain). 100 g 2   empagliflozin (JARDIANCE) 25 MG TABS tablet Take 1 tablet (25 mg total) by mouth daily before breakfast. 90 tablet 2   fluticasone (FLONASE) 50 MCG/ACT nasal spray Place 1 spray into both nostrils daily. 16 g 2   glucose 4 GM chewable tablet Chew 1 tablet (4 g total) by mouth as needed for low blood sugar. 50 tablet 12   glucose blood (ACCU-CHEK GUIDE) test strip You do not medically need to check blood sugar while not taking insulin. You can check your blood sugar if you are concerned when you want, but do not need to. 100 each 12   hydrOXYzine (ATARAX) 50 MG tablet Take 1 tablet (50 mg total) by mouth 3 (three) times daily as needed. 30 tablet 0   insulin degludec (TRESIBA FLEXTOUCH) 100 UNIT/ML FlexTouch Pen Inject 8 Units into the skin daily. 3 mL 9   Insulin Lispro-aabc (LYUMJEV KWIKPEN) 100 UNIT/ML KwikPen Inject 2 Units into the skin daily with breakfast AND 4 Units daily with lunch AND 4 Units daily with supper. 3 mL 12   Insulin Pen Needle (PEN NEEDLES) 32G X 4 MM MISC Use as directed. 100 each 10   latanoprost (XALATAN) 0.005 % ophthalmic  solution Place 1 drop into both eyes at bedtime.     latanoprost (XALATAN) 0.005 % ophthalmic solution Place 1 drop into both eyes daily. 2.5 mL 11   Multiple Vitamins-Minerals (WOMENS 50+ MULTI VITAMIN/MIN) TABS Take 1 tablet by mouth daily. 30 tablet    olmesartan (BENICAR) 20 MG tablet Take 1 tablet (20 mg total) by mouth daily. 30 tablet 11   [DISCONTINUED] glipiZIDE (GLUCOTROL) 5 MG tablet Take 0.5 tablets (2.5 mg total) by mouth daily before breakfast. 15 tablet 0   No current facility-administered medications on file prior to visit.    Family History  Problem Relation Age of Onset   Diabetes Mother    Hypertension Mother    Breast cancer Mother    Cancer Mother    Diabetes Sister    Hypertension Sister     Kidney disease Sister        One Kidney transplant, one sister on HD   Breast cancer Sister    Cancer Sister    Hypertension Sister    Diabetes Sister    Cerebral palsy Brother    Cancer Brother        Unknown   Hypertension Daughter    Colon cancer Neg Hx    Esophageal cancer Neg Hx    Stomach cancer Neg Hx    Rectal cancer Neg Hx     Social History   Socioeconomic History   Marital status: Married    Spouse name: Not on file   Number of children: 1   Years of education: Not on file   Highest education level: Not on file  Occupational History   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      The highest level of education was high school diploma.      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   Social Drivers of Health   Financial Resource Strain: Medium Risk (01/09/2024)   Overall Financial Resource Strain (CARDIA)    Difficulty of Paying Living Expenses: Somewhat hard  Food Insecurity: No Food Insecurity (01/09/2024)   Hunger Vital Sign    Worried About Running Out of Food in the Last Year: Never true    Ran Out of Food in the Last Year: Never true  Transportation Needs: No Transportation Needs (01/09/2024)   PRAPARE - Administrator, Civil Service (Medical): No    Lack of Transportation (Non-Medical): No  Physical Activity: Inactive (01/09/2024)   Exercise Vital Sign    Days of Exercise per Week: 0 days    Minutes  of Exercise per Session: 0 min  Stress: Stress Concern Present (01/09/2024)   Amanda [name] of Occupational Health - Occupational Stress Questionnaire    Feeling of Stress : To some extent  Social Connections: Moderately Integrated (01/09/2024)   Social Connection and Isolation Panel [NHANES]  Frequency of Communication with Friends and Family: More than three times a week    Frequency of Social Gatherings with Friends and Family: More than three times a week    Attends Religious Services: More than 4 times per year    Active Member of [employer] or Organizations: Yes    Attends Banker Meetings: 1 to 4 times per year    Marital Status: Widowed  Intimate Partner Violence: Not At Risk (01/09/2024)   Humiliation, Afraid, Rape, and Kick questionnaire    Fear of Current or Ex-Partner: No    Emotionally Abused: No    Physically Abused: No    Sexually Abused: No    Review of Systems: ROS negative except for what is noted on the assessment and plan.  Objective:   Vitals:   01/09/24 1012  BP: 135/66  Pulse: 87  Temp: 97.9 F (36.6 C)  TempSrc: Oral  SpO2: 100%  Weight: 143 lb 4.8 oz (65 kg)  Height: 5\' 1"  (1.549 m)    Physical Exam: Constitutional: well-appearing, in no acute distress Cardiovascular: regular rate and rhythm, no m/r/g Pulmonary/Chest: normal work of breathing on room air, lungs clear to auscultation bilaterally Abdominal: soft, non-tender, non-distended MSK: normal bulk and tone  Assessment & Plan:  Type II diabetes mellitus with complication (HCC) Medications include Dexcom, Farxiga 25 mg, Ozempic 1 mg weekly, Tresiba 8 units daily with lispro 2 units with breakfast and 4 units with lunch and dinner.  A1c improved from 8-7.3 today.  She continues to work closely with Lupita Leash for lifestyle changes.  Medications are currently affordable for her.  She is tolerating medications well. In review of her CGM data she is 73% within range for her blood sugar.   She did have 1 episode of hypoglycemia overnight a few days ago but does recall that she ate less during the day that day.  We talked about not taking as much short acting insulin with her dinner if she is not eating as much.  She will call clinic if this becomes a trend.  P: Continue current medication dosing. Follow-up in 3 months, would consider titrating Ozempic up if she continues to not have side effects from medications.  Seasonal allergies She has had eye itchiness puffiness for the last several years.  More than 10 years ago she went to an allergist and had a panel done and would like to go back.  Currently she is taking Zyrtec on days that her symptoms are severe.  She feels like she has 3 to 4 months of the year where she does not feel well due to allergies.  In December she was prescribed a steroid cream which she used on her extremities and found this to be helpful.  She request a refill on this medication.  No current rashes at this time.  She has not changed laundry detergents or soaps or lotions. P: Referral to allergist Zyrtec daily Refill hydrocortisone cream  Dyslipidemia History of PAD.  Will recheck lipid panel today.  She is taking atorvastatin 40 mg and aspirin 81 mg. P: Pete lipid panel and continue atorvastatin 40 mg and aspirin 81 mg.  CKD (chronic kidney disease), stage IV (HCC) He follows closely with nephrology and has an appointment with them next month.  Last BMP was in August and GFR was 25 at that time.  Diabetes is under better control with A1c of 7.3.  She brings in home blood pressure readings which are low but normotensive.  She is asymptomatic. P: Continue diabetes and hypertension control Repeat BMP Follow-up with nephrology next month.  Essential hypertension Current medications include amlodipine 5 mg, olmesartan 40 mg daily, and chlorthalidone 25 mg daily.  Pressure is well-controlled at 135/66 today.  She brings in home readings.  Diastolic  consistently in the 16X to 80s.  She did have 1 or 2 episodes in 4 days but she did not have dizziness or symptoms that day. P: Continue current dosing of medications Repeat BMP  Peroneal tendon injury Continues to follow with podiatry for peroneal tendinitis.  She request continuation of handicap placard.    Patient discussed with Dr. Wille Celeste Yobana Culliton, D.O. [facility]  PGY-3 Pager: [phone]  Phone: [phone] Date 01/12/2024  Time 7:33 AM

## 2024-01-09 NOTE — Assessment & Plan Note (Signed)
>>  ASSESSMENT AND PLAN FOR CKD (CHRONIC KIDNEY DISEASE), STAGE IV (HCC) WRITTEN ON 01/09/2024 11:26 AM BY [name], KATIE, DO  He follows closely with nephrology and has an appointment with them next month.  Last BMP was in August and GFR was 25 at that time.  Diabetes is under better control with A1c of 7.3.  She brings in home blood pressure readings which are low but normotensive.  She is asymptomatic. P: Continue diabetes and hypertension control Repeat BMP Follow-up with nephrology next month.

## 2024-01-09 NOTE — Assessment & Plan Note (Signed)
History of PAD.  Will recheck lipid panel today.  She is taking atorvastatin 40 mg and aspirin 81 mg. P: Pete lipid panel and continue atorvastatin 40 mg and aspirin 81 mg.

## 2024-01-09 NOTE — Patient Instructions (Addendum)
Thank you, Amanda [name] for allowing [place] to provide your care today.   Diabetes Your A1c looks great today at 7.3.  Our goal is to have this less than 7 for the best success with the help of your kidneys.  Continue taking your medications.  Call if you notice more lows with starting Ozempic.  If you are doing well we consider increasing the dose at follow-up in 3 months.  Blood pressure Your blood pressure looked good in clinic today.  Your home values are okay as well.  I agree with you that the bottom number is on the lower end of normal.  If you start to have dizziness or feel like you are going to pass out please call the clinic and we will adjust her medications.  I am rechecking your kidney function today and will forward results to your nephrologist.  Cholesterol I will call with results from your lipid panel.  You are scheduled for mammogram in March.  I placed a referral for an allergist with your chronic allergy symptoms.  I do recommend starting taking Zyrtec daily until you follow-up with them and they can complete an allergy panel testing.  I have refilled the hydrocortisone from December.  I have ordered the following labs for you:  Lab Orders         Lipid Profile         Glucose, capillary         BMP8+Anion Gap         POCT glycosylated hemoglobin (Hb A1C)       I have ordered the following medication/changed the following medications:   Stop the following medications: Medications Discontinued During This Encounter  Medication Reason   hydrocortisone 1 % lotion Reorder     Start the following medications: Meds ordered this encounter  Medications   Semaglutide, 1 MG/DOSE, 4 MG/3ML SOPN    Sig: Inject 1 mg into the skin once a week.    Dispense:  3 mL    Refill:  3   hydrocortisone 1 % lotion    Sig: Apply 1 Application topically 2 (two) times daily.    Dispense:  114 g    Refill:  2     Follow up: 3 months   We look forward to seeing you next time.  Please call our clinic at [phone] if you have any questions or concerns. The best time to call is Monday-Friday from 9am-4pm, but there is someone available 24/7. If after hours or the weekend, call the main hospital number and ask for the Internal Medicine Resident On-Call. If you need medication refills, please notify your pharmacy one week in advance and they will send [place] a request.   Thank you for trusting me with your care. Wishing you the best!   Rudene Christians, DO [facility]

## 2024-01-09 NOTE — Assessment & Plan Note (Signed)
She has had eye itchiness puffiness for the last several years.  More than 10 years ago she went to an allergist and had a panel done and would like to go back.  Currently she is taking Zyrtec on days that her symptoms are severe.  She feels like she has 3 to 4 months of the year where she does not feel well due to allergies.  In December she was prescribed a steroid cream which she used on her extremities and found this to be helpful.  She request a refill on this medication.  No current rashes at this time.  She has not changed laundry detergents or soaps or lotions. P: Referral to allergist Zyrtec daily Refill hydrocortisone cream

## 2024-01-09 NOTE — Assessment & Plan Note (Addendum)
Medications include Dexcom, Farxiga 25 mg, Ozempic 1 mg weekly, Tresiba 8 units daily with lispro 2 units with breakfast and 4 units with lunch and dinner.  A1c improved from 8-7.3 today.  She continues to work closely with Lupita Leash for lifestyle changes.  Medications are currently affordable for her.  She is tolerating medications well. In review of her CGM data she is 73% within range for her blood sugar.  She did have 1 episode of hypoglycemia overnight a few days ago but does recall that she ate less during the day that day.  We talked about not taking as much short acting insulin with her dinner if she is not eating as much.  She will call clinic if this becomes a trend.  P: Continue current medication dosing. Follow-up in 3 months, would consider titrating Ozempic up if she continues to not have side effects from medications.

## 2024-01-09 NOTE — Assessment & Plan Note (Signed)
He follows closely with nephrology and has an appointment with them next month.  Last BMP was in August and GFR was 25 at that time.  Diabetes is under better control with A1c of 7.3.  She brings in home blood pressure readings which are low but normotensive.  She is asymptomatic. P: Continue diabetes and hypertension control Repeat BMP Follow-up with nephrology next month.

## 2024-01-10 ENCOUNTER — Ambulatory Visit: Payer: Medicare Other | Admitting: Podiatry

## 2024-01-10 ENCOUNTER — Other Ambulatory Visit (HOSPITAL_COMMUNITY): Payer: Self-pay

## 2024-01-10 NOTE — Progress Notes (Signed)
 Internal Medicine Clinic Attending  Case discussed with the resident at the time of the visit.  We reviewed the resident's history and exam and pertinent patient test results.  I agree with the assessment, diagnosis, and plan of care documented in the resident's note.

## 2024-01-12 ENCOUNTER — Telehealth: Payer: Self-pay | Admitting: Internal Medicine

## 2024-01-12 DIAGNOSIS — N184 Chronic kidney disease, stage 4 (severe): Secondary | ICD-10-CM

## 2024-01-12 LAB — BMP8+ANION GAP
Anion Gap: 22 mmol/L — ABNORMAL HIGH (ref 10.0–18.0)
BUN/Creatinine Ratio: 16 (ref 12–28)
BUN: 34 mg/dL — ABNORMAL HIGH (ref 8–27)
CO2: 15 mmol/L — ABNORMAL LOW (ref 20–29)
Calcium: 9.3 mg/dL (ref 8.7–10.3)
Chloride: 112 mmol/L — ABNORMAL HIGH (ref 96–106)
Creatinine, Ser: 2.13 mg/dL — ABNORMAL HIGH (ref 0.57–1.00)
Glucose: 110 mg/dL — ABNORMAL HIGH (ref 70–99)
Potassium: 5.3 mmol/L — ABNORMAL HIGH (ref 3.5–5.2)
Sodium: 149 mmol/L — ABNORMAL HIGH (ref 134–144)
eGFR: 24 mL/min/1.73 — ABNORMAL LOW

## 2024-01-12 LAB — LIPID PANEL
Chol/HDL Ratio: 2.3 {ratio} (ref 0.0–4.4)
Cholesterol, Total: 156 mg/dL (ref 100–199)
HDL: 69 mg/dL (ref >39)
LDL Chol Calc (NIH): 76 mg/dL (ref 0–99)
Triglycerides: 51 mg/dL (ref 0–149)
VLDL Cholesterol Cal: 11 mg/dL (ref 5–40)

## 2024-01-12 NOTE — Telephone Encounter (Signed)
Patient was seen on Tuesday for routine checkup.  BMP showed elevation of her creatinine from baseline of 1.6-2.1.  GFR at 24.  Bicarb of 15 with potassium of 5.3 and BUN in 30s.  I messaged her nephrologist Dr. Glenna Fellows.  It sounds like patient had similar episode last year where her numbers rapidly improved with a recheck the following week.  I called and reviewed result with patient.  She reports normal urine output and has been eating and drinking normally.  She has noted some dizziness and her diastolic blood pressures have remained in the 50s to 60s.  I asked her to hold amlodipine over the weekend and to follow-up on Wednesday morning for lab only check and nurse visit for blood pressure check.  P: BMP, Future order placed Follow-up on Wednesday, January 29 for lab only and nurse blood pressure check

## 2024-01-16 ENCOUNTER — Other Ambulatory Visit (HOSPITAL_COMMUNITY): Payer: Self-pay

## 2024-01-17 ENCOUNTER — Ambulatory Visit (INDEPENDENT_AMBULATORY_CARE_PROVIDER_SITE_OTHER): Payer: Medicare Other | Admitting: *Deleted

## 2024-01-17 DIAGNOSIS — N184 Chronic kidney disease, stage 4 (severe): Secondary | ICD-10-CM

## 2024-01-17 NOTE — Progress Notes (Signed)
    Amanda [name] presented today for blood pressure check. Patient is prescribed blood pressure medications and I confirmed that patient did take their blood pressure medication prior to today's appointment. Blood pressure was taken in the usual and appropriate manner using an automated BP cuff.     Vitals:   01/17/24 1005 01/17/24 1013  BP: (!) 147/71 132/78      Results of today's visit will be routed to Dr. Sloan Leiter for review and further management. Pt stated she had stopped Amlodipine.

## 2024-01-18 LAB — BMP8+ANION GAP
Anion Gap: 14 mmol/L (ref 10.0–18.0)
BUN/Creatinine Ratio: 17 (ref 12–28)
BUN: 28 mg/dL — ABNORMAL HIGH (ref 8–27)
CO2: 21 mmol/L (ref 20–29)
Calcium: 8.8 mg/dL (ref 8.7–10.3)
Chloride: 102 mmol/L (ref 96–106)
Creatinine, Ser: 1.63 mg/dL — ABNORMAL HIGH (ref 0.57–1.00)
Glucose: 119 mg/dL — ABNORMAL HIGH (ref 70–99)
Potassium: 5.3 mmol/L — ABNORMAL HIGH (ref 3.5–5.2)
Sodium: 137 mmol/L (ref 134–144)
eGFR: 34 mL/min/{1.73_m2} — ABNORMAL LOW (ref >59)

## 2024-01-19 ENCOUNTER — Telehealth: Payer: Self-pay

## 2024-01-19 NOTE — Telephone Encounter (Signed)
Pt is requesting a call back . She did  come in on 1/29 and repeat her labs and she is wanting the results

## 2024-01-22 ENCOUNTER — Other Ambulatory Visit: Payer: Self-pay

## 2024-01-22 ENCOUNTER — Other Ambulatory Visit: Payer: Self-pay | Admitting: Internal Medicine

## 2024-01-22 ENCOUNTER — Other Ambulatory Visit: Payer: Self-pay | Admitting: Student

## 2024-01-22 ENCOUNTER — Other Ambulatory Visit (HOSPITAL_COMMUNITY): Payer: Self-pay

## 2024-01-22 DIAGNOSIS — I10 Essential (primary) hypertension: Secondary | ICD-10-CM

## 2024-01-22 DIAGNOSIS — E118 Type 2 diabetes mellitus with unspecified complications: Secondary | ICD-10-CM

## 2024-01-22 MED ORDER — OLMESARTAN MEDOXOMIL 20 MG PO TABS
20.0000 mg | ORAL_TABLET | Freq: Every day | ORAL | 4 refills | Status: DC
  Filled 2024-01-22: qty 30, 30d supply, fill #0
  Filled 2024-02-26: qty 30, 30d supply, fill #1

## 2024-01-22 MED ORDER — OZEMPIC (1 MG/DOSE) 4 MG/3ML ~~LOC~~ SOPN
1.0000 mg | PEN_INJECTOR | SUBCUTANEOUS | 11 refills | Status: DC
  Filled 2024-01-22: qty 3, 28d supply, fill #0

## 2024-01-25 ENCOUNTER — Ambulatory Visit: Payer: Medicare Other | Admitting: Podiatry

## 2024-01-26 ENCOUNTER — Other Ambulatory Visit (HOSPITAL_COMMUNITY): Payer: Self-pay

## 2024-01-31 ENCOUNTER — Other Ambulatory Visit (HOSPITAL_COMMUNITY): Payer: Self-pay

## 2024-01-31 ENCOUNTER — Other Ambulatory Visit: Payer: Self-pay | Admitting: Student

## 2024-01-31 DIAGNOSIS — I10 Essential (primary) hypertension: Secondary | ICD-10-CM

## 2024-01-31 MED ORDER — CHLORTHALIDONE 25 MG PO TABS
25.0000 mg | ORAL_TABLET | Freq: Every day | ORAL | 3 refills | Status: DC
  Filled 2024-01-31: qty 90, 90d supply, fill #0

## 2024-02-01 ENCOUNTER — Encounter: Payer: Self-pay | Admitting: Podiatry

## 2024-02-01 ENCOUNTER — Ambulatory Visit (INDEPENDENT_AMBULATORY_CARE_PROVIDER_SITE_OTHER): Payer: Medicare Other | Admitting: Podiatry

## 2024-02-01 ENCOUNTER — Other Ambulatory Visit (HOSPITAL_COMMUNITY): Payer: Self-pay

## 2024-02-01 DIAGNOSIS — B351 Tinea unguium: Secondary | ICD-10-CM | POA: Diagnosis not present

## 2024-02-01 DIAGNOSIS — M2011 Hallux valgus (acquired), right foot: Secondary | ICD-10-CM

## 2024-02-01 DIAGNOSIS — M2012 Hallux valgus (acquired), left foot: Secondary | ICD-10-CM

## 2024-02-01 DIAGNOSIS — M79675 Pain in left toe(s): Secondary | ICD-10-CM

## 2024-02-01 DIAGNOSIS — E1142 Type 2 diabetes mellitus with diabetic polyneuropathy: Secondary | ICD-10-CM | POA: Diagnosis not present

## 2024-02-01 DIAGNOSIS — M79674 Pain in right toe(s): Secondary | ICD-10-CM

## 2024-02-01 NOTE — Patient Instructions (Signed)
You can combine 3 tablespoons of coconut oil and 10-15 drops of tea tree oil together and apply to the toenails once a day.

## 2024-02-01 NOTE — Progress Notes (Signed)
  Subjective:  Patient ID: Amanda [name], female    DOB: 08-21-1951,  MRN: [id number]  Chief Complaint  Patient presents with   [facility]    She is here for a nail trim for her diabetes foot care.  She is also here for some new pain in the bottom of the feet with an achy and little tingly feeling"    73 y.o. female presents with the above complaint. History confirmed with patient. Patient presenting with pain related to dystrophic thickened elongated nails. Patient is unable to trim own nails related to nail dystrophy and/or mobility issues. Patient does have a history of T2DM. No significant callus today.  Does report some subjective tingling to toes and bottoms of feet.  Objective:  Physical Exam: warm, good capillary refill nail exam onychomycosis of the toenails, onycholysis, and dystrophic nails DP pulses palpable and PT pulses palpable, vibratory sensation decreased, protective sensation intact. Left Foot:  Pain with palpation of nails due to elongation and dystrophic growth.  Right Foot: Pain with palpation of nails due to elongation and dystrophic growth.  Bilateral bunion deformities appreciated bilaterally with decreased first MPJ dorsiflexion.  Tracking in nature.  Assessment:   1. Pain due to onychomycosis of toenails of both feet   2. Hallux valgus, acquired, bilateral   3. Diabetic peripheral neuropathy associated with type 2 diabetes mellitus (HCC)      Plan:  Patient was evaluated and treated and all questions answered.  #Onychomycosis with pain  -Nails palliatively debrided as below. -Educated on self-care  Procedure: Nail Debridement Rationale: Pain Type of Debridement: manual, sharp debridement. Instrumentation: Nail nipper, rotary burr. Number of Nails: 10  # Diabetes with neuropathy # Bilateral bunions -Patient educated on diabetes. Discussed proper diabetic foot care and discussed risks and complications of disease. Educated patient in depth on reasons to  return to the office immediately should he/she discover anything concerning or new on the feet. All questions answered. Discussed proper shoes as well.  -Patient would benefit from diabetic shoes with custom inserts to offload the bunion deformities and prevent ulceration.  Prescription written for custom shoes with inserts x 3.  Return in about 3 months (around 04/30/2024) for Diabetic Foot Care.         Bronwen Betters, DPM [facility]

## 2024-02-13 ENCOUNTER — Other Ambulatory Visit (HOSPITAL_COMMUNITY): Payer: Self-pay

## 2024-02-13 ENCOUNTER — Ambulatory Visit (INDEPENDENT_AMBULATORY_CARE_PROVIDER_SITE_OTHER): Payer: Medicare Other | Admitting: Student

## 2024-02-13 VITALS — BP 122/70 | HR 86 | Temp 97.7°F | Ht 61.0 in | Wt 145.4 lb

## 2024-02-13 DIAGNOSIS — M79605 Pain in left leg: Secondary | ICD-10-CM | POA: Diagnosis not present

## 2024-02-13 DIAGNOSIS — M79604 Pain in right leg: Secondary | ICD-10-CM

## 2024-02-13 DIAGNOSIS — Z7985 Long-term (current) use of injectable non-insulin antidiabetic drugs: Secondary | ICD-10-CM

## 2024-02-13 DIAGNOSIS — E118 Type 2 diabetes mellitus with unspecified complications: Secondary | ICD-10-CM | POA: Diagnosis not present

## 2024-02-13 DIAGNOSIS — Z794 Long term (current) use of insulin: Secondary | ICD-10-CM

## 2024-02-13 DIAGNOSIS — I10 Essential (primary) hypertension: Secondary | ICD-10-CM

## 2024-02-13 MED ORDER — OZEMPIC (2 MG/DOSE) 8 MG/3ML ~~LOC~~ SOPN
2.0000 mg | PEN_INJECTOR | SUBCUTANEOUS | 6 refills | Status: AC
  Filled 2024-02-13: qty 3, 28d supply, fill #0
  Filled 2024-03-15: qty 3, 28d supply, fill #1
  Filled 2024-03-29 – 2024-04-16 (×2): qty 3, 28d supply, fill #2
  Filled 2024-07-05 – 2024-09-30 (×2): qty 3, 28d supply, fill #3
  Filled 2024-12-10: qty 3, 28d supply, fill #4
  Filled 2025-01-14: qty 3, 28d supply, fill #5

## 2024-02-13 NOTE — Progress Notes (Unsigned)
 CC: Acute Concern of bilateral nonspecific leg pain  HPI:  Amanda [name] is a 73 y.o. female with pertinent PMH of HTN, T2DM, PAD, CKD stage IIIb, MDD, and HLD who presents as above. Please see assessment and plan below for further details.  Review of Systems:   Pertinent items noted in HPI and/or A&P.  Physical Exam:  Vitals:   02/13/24 1502 02/13/24 1547  BP: (!) 88/51 122/70  Pulse: 95 86  Temp: 97.7 F (36.5 C)   TempSrc: Oral   SpO2: 100%   Weight: 145 lb 6.4 oz (66 kg)   Height: 5\' 1"  (1.549 m)     Constitutional: Well-appearing adult female. In no acute distress. HEENT: Normocephalic, atraumatic, Sclera non-icteric, PERRL, EOM intact Cardio:Regular rate and rhythm. 2+ bilateral radial and dorsalis pedis  pulses. Pulm:Clear to auscultation bilaterally. Normal work of breathing on room air. Abdomen: Soft, non-tender, non-distended, positive bowel sounds. MSK: Bilateral lower extremities without swelling, tenderness, rash, skin changes, joint effusion, or other abnormality. Skin:Warm and dry. Neuro:Alert and oriented x3. No focal deficit noted.  Bilateral lower extremity strength and sensation normal and equal. Psych:Pleasant mood and affect.   Assessment & Plan:   Essential hypertension Initial blood pressure today 88/51 with improvement to 122/70 after oral hydration.  However home blood pressures that she takes at least twice daily have been more consistent with the initial blood pressure with her highest systolic being 107.  She has not had any significant orthostatic symptoms to report.  She brings her medications with her and is not taking amlodipine but is taking chlorthalidone 25 mg daily and olmesartan 20 mg daily.  She previously has had mildly elevated potassium with her last value being 5.3.  BMP today shows improvement with potassium 4.7.  Since we have discontinued her chlorthalidone today we will repeat a BMP at her next visit in 3 weeks. - Stop  chlorthalidone and continue olmesartan 20 mg daily - Keep blood pressure log with goal blood pressure between 90/60 and 140/90  Type II diabetes mellitus with complication (HCC) Patient brings her Dexcom G7 CGM and reader with her and notes overnight lows in the 70s that wake her up with some mild symptoms that improved with small amount of orange juice.  She is currently on Tresiba 8 units daily, lispro 2/4/4 units with meals, Jardiance 25 mg daily, and Ozempic 1 mg weekly which she has been on for the past 5 weeks without side effects.  Due to the lows we will change her medications. - Stop insulin lispro - Increase Ozempic to 2 mg weekly - Continue Tresiba 8 units daily with instructions to call our clinic if she has anymore low blood sugars - Return in 3 weeks to review CGM data  Pain in both lower extremities Patient comes in today with concerns of few weeks of leg pain primarily in the medial calf on the left and medial knee on the right that is intermittent and feels like pins-and-needles mostly.  She has not had any change in the sensation in her feet or any of this pain in her feet.  Pain is primarily worsened by contact and she is started sleeping with a pillow between her legs which has helped.  She has tried Tylenol without much benefit.  Denies any low back pain or radicular pain.  On exam there is no overlying skin changes, lower extremity edema, reproduction of the pain on palpation, calf muscle tenderness, knee effusion, strength and sensation are normal  and equal in bilateral lower extremities, and she has strong dorsalis pedis pulses bilaterally.  Currently unsure of the etiology of this pain but it is not consistent with arterial insufficiency, venous clot, nerve entrapment or radicular pain, infection, or progressive diabetic neuropathy.  We will treat symptomatically and monitor for any development. - Tylenol 1000 mg every 8 hours as needed, topical Voltaren gel 4 times daily,  ice/heat as needed, and exercise as tolerated    Patient discussed with Dr. Duwayne Heck, DO [facility] Center Internal Medicine Resident PGY-2 Clinic Phone: [phone] Pager: [phone]

## 2024-02-13 NOTE — Patient Instructions (Addendum)
  Thank you, [name], for allowing [place] to provide your care today. Today we discussed . . .  > Diabetes       -Today we are going to increase your Ozempic to 2 mg weekly and stop the Lyumjev insulin.  You can continue taking the Tresiba 8 units daily.  If you have any low blood sugars below 70 with symptoms in the morning or below 60 please let us know and we will adjust this. > Blood pressure       -Your blood pressure here is slightly low and I am worried that may or overtreating you with your medications.  We are going to stop your chlorthalidone and I would like you to keep taking your blood pressure at home.  If your blood pressure is consistently below 90/60 please call us and let us know and we will talk about decreasing this medication further.  Also if your blood pressure is consistently above 130/90 let us know before your next visit. > Leg pain       -.  Leg pain I would like to try some symptomatic treatments since it does not sound like decreased blood flow, nerve pain, or infection.  Lets try Voltaren gel over the painful areas 4 times a day in addition to low back and hamstring stretches as well as getting a heating pad for your low back which she can try on your legs.  If this does not make it better you can also try ice over the area.  You can continue to take Tylenol 1000 mg every 8 hours as needed for pain but if you are taking 1300 mg (2x 650 mg tablets) then I would not to take this dose 3 times a day for more than 1 day.  Follow up:  3 Weeks     Remember:     Should you have any questions or concerns please call the internal medicine clinic at [phone].     Rocky Morel, DO [facility]

## 2024-02-14 DIAGNOSIS — M79604 Pain in right leg: Secondary | ICD-10-CM

## 2024-02-14 HISTORY — DX: Pain in right leg: M79.604

## 2024-02-14 LAB — BMP8+ANION GAP
Anion Gap: 13 mmol/L (ref 10.0–18.0)
BUN/Creatinine Ratio: 21 (ref 12–28)
BUN: 36 mg/dL — ABNORMAL HIGH (ref 8–27)
CO2: 19 mmol/L — ABNORMAL LOW (ref 20–29)
Calcium: 8.8 mg/dL (ref 8.7–10.3)
Chloride: 108 mmol/L — ABNORMAL HIGH (ref 96–106)
Creatinine, Ser: 1.68 mg/dL — ABNORMAL HIGH (ref 0.57–1.00)
Glucose: 111 mg/dL — ABNORMAL HIGH (ref 70–99)
Potassium: 4.7 mmol/L (ref 3.5–5.2)
Sodium: 140 mmol/L (ref 134–144)
eGFR: 32 mL/min/{1.73_m2} — ABNORMAL LOW (ref >59)

## 2024-02-14 NOTE — Assessment & Plan Note (Signed)
 Patient comes in today with concerns of few weeks of leg pain primarily in the medial calf on the left and medial knee on the right that is intermittent and feels like pins-and-needles mostly.  She has not had any change in the sensation in her feet or any of this pain in her feet.  Pain is primarily worsened by contact and she is started sleeping with a pillow between her legs which has helped.  She has tried Tylenol without much benefit.  Denies any low back pain or radicular pain.  On exam there is no overlying skin changes, lower extremity edema, reproduction of the pain on palpation, calf muscle tenderness, knee effusion, strength and sensation are normal and equal in bilateral lower extremities, and she has strong dorsalis pedis pulses bilaterally.  Currently unsure of the etiology of this pain but it is not consistent with arterial insufficiency, venous clot, nerve entrapment or radicular pain, infection, or progressive diabetic neuropathy.  We will treat symptomatically and monitor for any development. - Tylenol 1000 mg every 8 hours as needed, topical Voltaren gel 4 times daily, ice/heat as needed, and exercise as tolerated

## 2024-02-14 NOTE — Assessment & Plan Note (Signed)
 Patient brings her Dexcom G7 CGM and reader with her and notes overnight lows in the 70s that wake her up with some mild symptoms that improved with small amount of orange juice.  She is currently on Tresiba 8 units daily, lispro 2/4/4 units with meals, Jardiance 25 mg daily, and Ozempic 1 mg weekly which she has been on for the past 5 weeks without side effects.  Due to the lows we will change her medications. - Stop insulin lispro - Increase Ozempic to 2 mg weekly - Continue Tresiba 8 units daily with instructions to call our clinic if she has anymore low blood sugars - Return in 3 weeks to review CGM data

## 2024-02-14 NOTE — Assessment & Plan Note (Addendum)
 Initial blood pressure today 88/51 with improvement to 122/70 after oral hydration.  However home blood pressures that she takes at least twice daily have been more consistent with the initial blood pressure with her highest systolic being 107.  She has not had any significant orthostatic symptoms to report.  She brings her medications with her and is not taking amlodipine but is taking chlorthalidone 25 mg daily and olmesartan 20 mg daily.  She previously has had mildly elevated potassium with her last value being 5.3.  BMP today shows improvement with potassium 4.7.  Since we have discontinued her chlorthalidone today we will repeat a BMP at her next visit in 3 weeks. - Stop chlorthalidone and continue olmesartan 20 mg daily - Keep blood pressure log with goal blood pressure between 90/60 and 140/90

## 2024-02-14 NOTE — Progress Notes (Signed)
 Internal Medicine Clinic Attending  Case discussed with the resident at the time of the visit.  We reviewed the resident's history and exam and pertinent patient test results.  I agree with the assessment, diagnosis, and plan of care documented in the resident's note.    At next visit, please check BP and review her home log. She may need a lower dose of chlorthalidone.

## 2024-02-15 ENCOUNTER — Other Ambulatory Visit (HOSPITAL_COMMUNITY): Payer: Self-pay

## 2024-02-16 ENCOUNTER — Other Ambulatory Visit (HOSPITAL_COMMUNITY): Payer: Self-pay

## 2024-02-19 ENCOUNTER — Other Ambulatory Visit: Payer: Self-pay | Admitting: Internal Medicine

## 2024-02-19 ENCOUNTER — Ambulatory Visit
Admission: RE | Admit: 2024-02-19 | Discharge: 2024-02-19 | Disposition: A | Discharge status: Home / Self Care | Payer: Medicare Other | Source: Ambulatory Visit | Attending: Internal Medicine | Admitting: Internal Medicine

## 2024-02-19 ENCOUNTER — Other Ambulatory Visit (HOSPITAL_COMMUNITY): Payer: Self-pay

## 2024-02-19 ENCOUNTER — Telehealth: Payer: Self-pay

## 2024-02-19 DIAGNOSIS — E1142 Type 2 diabetes mellitus with diabetic polyneuropathy: Secondary | ICD-10-CM

## 2024-02-19 DIAGNOSIS — Z1231 Encounter for screening mammogram for malignant neoplasm of breast: Secondary | ICD-10-CM

## 2024-02-19 DIAGNOSIS — Z794 Long term (current) use of insulin: Secondary | ICD-10-CM

## 2024-02-19 DIAGNOSIS — E1121 Type 2 diabetes mellitus with diabetic nephropathy: Secondary | ICD-10-CM

## 2024-02-19 MED ORDER — DEXCOM G7 SENSOR MISC
0 refills | Status: DC
  Filled 2024-02-19: qty 9, 90d supply, fill #0

## 2024-02-19 NOTE — Telephone Encounter (Signed)
 Called [employer] to f/u on shipment status.  Company has multiple shipping delays. A voucher was provided for patient to use for a 30 day supply at the pharmacy.   TRESIBA U100 PENS BIN [id number] [employer]N [employer] [id number] ID [id number] *USE VOUCHER - DO NOT BILL INSURANCE*  NOVOFINE PEN NEEDLES BIN [id number] [employer] [id number] ID [id number] *USE VOUCHER - DO NOT BILL INSURANCE*  Please send in a month supply of both prescriptions to their preferred pharmacy with the above billing info and note. Thanks!  I will also be sending a refill form to [employer] for both Tresiba and Ozempic 2mg  dose pens.

## 2024-02-20 ENCOUNTER — Other Ambulatory Visit (HOSPITAL_COMMUNITY): Payer: Self-pay

## 2024-02-21 MED ORDER — PEN NEEDLES 32G X 4 MM MISC
10 refills | Status: DC
  Filled 2024-02-21: qty 100, 100d supply, fill #0
  Filled 2024-03-01: qty 100, 100d supply, fill #1

## 2024-02-21 MED ORDER — TRESIBA FLEXTOUCH 100 UNIT/ML ~~LOC~~ SOPN
8.0000 [IU] | PEN_INJECTOR | Freq: Every day | SUBCUTANEOUS | 9 refills | Status: DC
  Filled 2024-02-21 – 2024-03-01 (×2): qty 3, 37d supply, fill #0

## 2024-02-22 ENCOUNTER — Ambulatory Visit: Payer: Medicare Other | Admitting: Podiatry

## 2024-02-22 ENCOUNTER — Other Ambulatory Visit (HOSPITAL_COMMUNITY): Payer: Self-pay

## 2024-02-26 ENCOUNTER — Other Ambulatory Visit (HOSPITAL_COMMUNITY): Payer: Self-pay

## 2024-02-28 ENCOUNTER — Other Ambulatory Visit (HOSPITAL_COMMUNITY): Payer: Self-pay

## 2024-03-01 ENCOUNTER — Other Ambulatory Visit (HOSPITAL_COMMUNITY): Payer: Self-pay

## 2024-03-01 NOTE — Telephone Encounter (Signed)
 Evaristo Bury and Ozempic new meds/refills in process of getting completed.  Pharmacy was able to fill medications using voucher.

## 2024-03-05 ENCOUNTER — Other Ambulatory Visit (HOSPITAL_COMMUNITY): Payer: Self-pay

## 2024-03-05 ENCOUNTER — Ambulatory Visit (INDEPENDENT_AMBULATORY_CARE_PROVIDER_SITE_OTHER): Payer: Medicare Other | Admitting: Internal Medicine

## 2024-03-05 VITALS — BP 134/74 | HR 87 | Temp 97.9°F | Ht 61.0 in | Wt 144.4 lb

## 2024-03-05 DIAGNOSIS — E1121 Type 2 diabetes mellitus with diabetic nephropathy: Secondary | ICD-10-CM

## 2024-03-05 DIAGNOSIS — Z7985 Long-term (current) use of injectable non-insulin antidiabetic drugs: Secondary | ICD-10-CM | POA: Diagnosis not present

## 2024-03-05 DIAGNOSIS — E118 Type 2 diabetes mellitus with unspecified complications: Secondary | ICD-10-CM

## 2024-03-05 DIAGNOSIS — I10 Essential (primary) hypertension: Secondary | ICD-10-CM | POA: Diagnosis not present

## 2024-03-05 DIAGNOSIS — Z794 Long term (current) use of insulin: Secondary | ICD-10-CM

## 2024-03-05 DIAGNOSIS — E1169 Type 2 diabetes mellitus with other specified complication: Secondary | ICD-10-CM

## 2024-03-05 LAB — POCT GLYCOSYLATED HEMOGLOBIN (HGB A1C): Hemoglobin A1C: 7 % — AB (ref 4.0–5.6)

## 2024-03-05 LAB — GLUCOSE, CAPILLARY: Glucose-Capillary: 131 mg/dL — ABNORMAL HIGH (ref 70–99)

## 2024-03-05 MED ORDER — TRESIBA FLEXTOUCH 100 UNIT/ML ~~LOC~~ SOPN
6.0000 [IU] | PEN_INJECTOR | Freq: Every day | SUBCUTANEOUS | 9 refills | Status: DC
  Filled 2024-03-05: qty 3, 50d supply, fill #0

## 2024-03-05 MED ORDER — OLMESARTAN MEDOXOMIL 20 MG PO TABS
10.0000 mg | ORAL_TABLET | Freq: Every day | ORAL | 4 refills | Status: DC
  Filled 2024-03-05 – 2024-03-22 (×3): qty 30, 60d supply, fill #0

## 2024-03-05 NOTE — Assessment & Plan Note (Addendum)
 Blood pressure elevated at 155/78 and went to 138/74 when rechecked. Home medications include olmesartan 20 mg. Chlorthalidone was discontinued at office visit in Feb as blood pressure was 88/51. She has continued to check her blood pressures at home 1-2 times daily. She continues to have some blood pressures in 80s/ 50s. She has not noticed dizziness unless she stand up quickly.  Orthostatics checked and were positive Orthostatic VS for the past 24 hrs (Last 3 readings):  BP- Lying Pulse- Lying BP- Sitting Pulse- Sitting BP- Standing at 0 minutes Pulse- Standing at 0 minutes BP- Standing at 3 minutes Pulse- Standing at 3 minutes  03/05/24 1103 144/71 85 162/74 85 124/66 85 124/73 89  P: Decrease olmesartan from 20 mg to 10 mg. She has a pill cutter at home she will use Follow-up in 2 weeks for blood pressure check

## 2024-03-05 NOTE — Patient Instructions (Signed)
 Thank you, [name] for allowing [place] to provide your care today.   Diabetes A1c was 7 today. Decrease tresiba from 8 units to 6 units. Will plan to download data at follow-up with RN in 2 weeks.  Blood pressure Please decrease olmesartan to 1/2 tablet daily. Follow-up in 2 weeks for nurse only visit. I will ask them to get orthostatics at that visit.  I have ordered the following labs for you:  Lab Orders         Glucose, capillary         POC Hbg A1C      Referrals ordered today:   Referral Orders  No referral(s) requested today     I have ordered the following medication/changed the following medications:   Stop the following medications: Medications Discontinued During This Encounter  Medication Reason   insulin degludec (TRESIBA FLEXTOUCH) 100 UNIT/ML FlexTouch Pen    olmesartan (BENICAR) 20 MG tablet      Start the following medications: Meds ordered this encounter  Medications   insulin degludec (TRESIBA FLEXTOUCH) 100 UNIT/ML FlexTouch Pen    Sig: Inject 6 Units into the skin daily.    Dispense:  3 mL    Refill:  9    BIN [id number] PCN [employer] [id number] ID [id number] *USE VOUCHER - DO NOT BILL INSURANCE*   olmesartan (BENICAR) 20 MG tablet    Sig: Take 1/2 tablet (10 mg total) by mouth daily.    Dispense:  30 tablet    Refill:  4     Follow up: 2 weeks for nurse only visit   We look forward to seeing you next time. Please call our clinic at [phone] if you have any questions or concerns. The best time to call is Monday-Friday from 9am-4pm, but there is someone available 24/7. If after hours or the weekend, call the main hospital number and ask for the Internal Medicine Resident On-Call. If you need medication refills, please notify your pharmacy one week in advance and they will send [place] a request.   Thank you for trusting me with your care. Wishing you the best!   Rudene Christians, DO [facility]

## 2024-03-05 NOTE — Assessment & Plan Note (Signed)
 A1c from 7.3 to 7.  Home medications include ozempic 2 mg weekly, jardiance 25 mg, tresiba 8 units. She was having some hypoglycemic episodes overnight and meal time insulin was stopped in late Feb. Ozempic dose increased to 2 mg. She is having some constipation with this change and has started taking senna daily with improvement in symptoms. She continues to have hypoglycemic episodes nightly. She has tried moving back dinner to 8:30 or 9 pm but this has not seemed to help with her sugars. On review of CGM data she continues to have post prandial spikes after lunch and dinner. P: Decrease tresiba from 8 to 6 units Continue ozempic 2 mg weekly Likely blood sugars are lower in setting of weight loss after starting ozempic. Would titrate insulin carefully in setting of CKD stage 4 Follow-up in 2 weeks for nurse visit, will download dexcom information at that time and I have asked them to forward report to me

## 2024-03-05 NOTE — Progress Notes (Addendum)
 Subjective:  CC: diabetes  HPI:  [name] is a 73 y.o. female with a past medical history of diabetes, HTN, CKD who presents today for diabetes and blood pressure.  She was last seen 02/13/24. She was having hypoglycemic episodes and low blood pressure and medications were  adjusted. Since then she has continued to have some low blood pressures at home and overnight hypoglycemic episodes.  Please see problem based assessment and plan for additional details.  Past Medical History:  Diagnosis Date   Allergy    SEASONAL   Arthritis    Cervical radiculopathy due to degenerative joint disease of spine 05/03/2017   CKD (chronic kidney disease) stage 3, GFR 30-59 ml/min (HCC) 01/01/2021   Diabetes mellitus 1996   Diabetic neuropathy (HCC) 05/20/2013   Glaucoma    H. pylori infection    Hyperlipidemia    Hypertension    Normocytic anemia 02/14/2013   Osteopenia 05/04/2016   Pain of both shoulder joints 11/14/2016    MEDICATIONS:  Latanoprost eyedrops Semaglutide 2 mg weekly Olmesartan 20 mg daily Jardiance 25 mg daily Atorvastatin 40 mg daily tresiba 8 units daily  Family History  Problem Relation Age of Onset   Diabetes Mother    Hypertension Mother    Breast cancer Mother    Cancer Mother    Diabetes Sister    Hypertension Sister    Kidney disease Sister        One Kidney transplant, one sister on HD   Breast cancer Sister    Cancer Sister    Hypertension Sister    Diabetes Sister    Cerebral palsy Brother    Cancer Brother        Unknown   Hypertension Daughter    Colon cancer Neg Hx    Esophageal cancer Neg Hx    Stomach cancer Neg Hx    Rectal cancer Neg Hx     Past Surgical History:  Procedure Laterality Date   ABDOMINAL HYSTERECTOMY     10 years ago    BREAST BIOPSY     BREAST LUMPECTOMY WITH RADIOACTIVE SEED LOCALIZATION Right 02/16/2022   Procedure: RIGHT BREAST LUMPECTOMY WITH RADIOACTIVE SEED LOCALIZATION;  Surgeon: Harriette Bouillon,  MD;  Location: [facility] OR;  Service: General;  Laterality: Right;   Cataracts     ROTATOR CUFF REPAIR     SHOULDER SURGERY     Rotator cuff tear      Social History   Socioeconomic History   Marital status: Married    Spouse name: Not on file   Number of children: 1   Years of education: Not on file   Highest education level: Not on file  Occupational History   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      The highest level of education was high school diploma.  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   Social Drivers of Health   Financial Resource Strain: Medium Risk (01/09/2024)   Overall Financial Resource Strain (CARDIA)    Difficulty of Paying Living Expenses: Somewhat hard  Food Insecurity: No Food Insecurity (01/09/2024)   Hunger Vital Sign    Worried About Running Out of Food in the Last Year: Never true    Ran Out of Food in the Last Year: Never true  Transportation Needs: No Transportation Needs (01/09/2024)   PRAPARE - Administrator, Civil Service (Medical): No    Lack of Transportation (Non-Medical): No  Physical Activity: Inactive (01/09/2024)   Exercise Vital Sign    Days of Exercise per Week: 0 days    Minutes of Exercise per Session: 0 min  Stress: Stress Concern Present (01/09/2024)   [employer] of Occupational Health - Occupational Stress Questionnaire    Feeling of Stress : To some extent   Social Connections: Moderately Integrated (01/09/2024)   Social Connection and Isolation Panel [NHANES]    Frequency of Communication with Friends and Family: More than three times a week    Frequency of Social Gatherings with Friends and Family: More than three times a week    Attends Religious Services: More than 4 times per year    Active Member of [employer] or Organizations: Yes    Attends Banker Meetings: 1 to 4 times per year    Marital Status: Widowed  Intimate Partner Violence: Not At Risk (01/09/2024)   Humiliation, Afraid, Rape, and Kick questionnaire    Fear of Current or Ex-Partner: No    Emotionally Abused: No    Physically Abused: No    Sexually Abused: No    Review of Systems: ROS negative except for what is noted on the assessment and plan.  Objective:   Vitals:   03/05/24 1027 03/05/24 1040  BP: (!) 155/78 134/74  Pulse: 87   Temp: 97.9 F (36.6 C)   TempSrc: Oral   SpO2: 100%   Weight: 144 lb 6.4 oz (65.5 kg)   Height: 5\' 1"  (1.549 m)     Physical Exam: Constitutional: well-appearing, in no acute distress Cardiovascular: regular rate and rhythm, no m/r/g Pulmonary/Chest: normal work of breathing on room air, lungs clear to auscultation bilaterally Abdominal: soft, non-tender, non-distended MSK: normal bulk and tone Neurological: alert & oriented x 3, normal gait Skin: warm and dry   Assessment & Plan:  Type II diabetes mellitus with complication (HCC) A1c from 7.3 to 7.  Home medications include ozempic 2 mg weekly, jardiance 25 mg, tresiba 8 units. She was having some hypoglycemic episodes overnight and meal time insulin was stopped in late Feb. Ozempic dose increased to 2 mg. She is having some constipation with this change and has started taking senna daily with improvement in symptoms. She continues to have hypoglycemic episodes nightly. She has tried moving back dinner to 8:30 or 9 pm but this has not seemed to help with her sugars. On  review of CGM data she continues to have post prandial spikes after lunch and dinner. P: Decrease tresiba from 8 to 6 units Continue ozempic 2 mg weekly Likely blood sugars are lower in setting of weight loss after starting ozempic. Would titrate insulin carefully in setting of CKD stage 4 Follow-up in 2 weeks for nurse visit, will download dexcom information at that time and I have asked them to forward report to me  Essential hypertension Blood pressure elevated  at 155/78 and went to 138/74 when rechecked. Home medications include olmesartan 20 mg. Chlorthalidone was discontinued at office visit in Feb as blood pressure was 88/51. She has continued to check her blood pressures at home 1-2 times daily. She continues to have some blood pressures in 80s/ 50s. She has not noticed dizziness unless she stand up quickly.  Orthostatics checked and were positive Orthostatic VS for the past 24 hrs (Last 3 readings):  BP- Lying Pulse- Lying BP- Sitting Pulse- Sitting BP- Standing at 0 minutes Pulse- Standing at 0 minutes BP- Standing at 3 minutes Pulse- Standing at 3 minutes  03/05/24 1103 144/71 85 162/74 85 124/66 85 124/73 89  P: Decrease olmesartan from 20 mg to 10 mg. She has a pill cutter at home she will use Follow-up in 2 weeks for blood pressure check   Patient discussed with Dr. Polo Riley Zeva Leber, D.O. [facility]  PGY-3 Pager: [phone]  Phone: [phone] Date 03/19/2024  Time 4:48 PM

## 2024-03-06 ENCOUNTER — Telehealth: Payer: Self-pay

## 2024-03-06 ENCOUNTER — Ambulatory Visit: Payer: Medicare Other

## 2024-03-06 DIAGNOSIS — M2142 Flat foot [pes planus] (acquired), left foot: Secondary | ICD-10-CM

## 2024-03-06 DIAGNOSIS — E1142 Type 2 diabetes mellitus with diabetic polyneuropathy: Secondary | ICD-10-CM

## 2024-03-06 DIAGNOSIS — M2011 Hallux valgus (acquired), right foot: Secondary | ICD-10-CM

## 2024-03-06 DIAGNOSIS — M2041 Other hammer toe(s) (acquired), right foot: Secondary | ICD-10-CM

## 2024-03-06 DIAGNOSIS — L84 Corns and callosities: Secondary | ICD-10-CM

## 2024-03-06 NOTE — Progress Notes (Signed)
 Pharmacy Medication Assistance Program Note    03/13/2024  Patient ID: Amanda [name], female   DOB: 1951-06-13, 73 y.o.   MRN: [id number]     03/06/2024  Outreach Medication One  Manufacturer Medication One Novo Nordisk  Nordisk Drugs Ozempic   Dose of Ozempic  2MG   Type of Sport and exercise psychologist  Date Application Sent to Prescriber 03/06/2024  Name of Prescriber Sheree Dieter  Date Application Received From Provider 03/13/2024  Date Application Submitted to Manufacturer 03/13/2024     New med added. No longer on Rybelsus .  Tresiba  refills sent as well.

## 2024-03-06 NOTE — Progress Notes (Signed)
 Patient presents to the office today for diabetic shoe and insole measuring.  Patient was measured with brannock device to determine size and width for 1 pair of extra depth shoes and foam casted for 3 pair of insoles.   Documentation of medical necessity will be sent to patient's treating diabetic doctor to verify and sign.   Patient's diabetic provider: Florentina Addison masters DO   Shoes and insoles will be ordered at that time and patient will be notified for an appointment for fitting when they arrive.  Shoe size (per patient): 49M Shoe choice:   [id number] / [id number] Shoe size ordered:  Ppwk / ABN   Patient is aware Items could reject as it has not been 12 months

## 2024-03-07 NOTE — Progress Notes (Signed)
.  attestn

## 2024-03-15 ENCOUNTER — Other Ambulatory Visit: Payer: Self-pay | Admitting: Internal Medicine

## 2024-03-15 ENCOUNTER — Other Ambulatory Visit (HOSPITAL_COMMUNITY): Payer: Self-pay

## 2024-03-15 DIAGNOSIS — E785 Hyperlipidemia, unspecified: Secondary | ICD-10-CM

## 2024-03-15 MED ORDER — ATORVASTATIN CALCIUM 40 MG PO TABS
40.0000 mg | ORAL_TABLET | Freq: Every day | ORAL | 2 refills | Status: DC
  Filled 2024-03-15: qty 90, 90d supply, fill #0
  Filled 2024-07-09: qty 90, 90d supply, fill #1
  Filled 2024-10-08: qty 90, 90d supply, fill #2

## 2024-03-15 NOTE — Telephone Encounter (Signed)
 Medication sent to pharmacy

## 2024-03-19 ENCOUNTER — Ambulatory Visit: Admitting: *Deleted

## 2024-03-19 NOTE — Progress Notes (Signed)
 Blood  pressures were given to [name].  Patient instructed to hold blood pressure medication for today per Dr. Sloan Leiter order and will cal, patient today. Patient was also given water to drink. Had a bout of Diarrhea during the night.  Feels better now.

## 2024-03-19 NOTE — Progress Notes (Signed)
    Candace Gallus presented today for blood pressure check. Patient is prescribed blood pressure medications and I confirmed that patient did not take their blood pressure medication prior to today's appointment. Blood pressure was taken in the usual and appropriate manner using an automated BP cuff.     139 /62 P- 81    Results of today's visit will be routed to Dr. Sloan Leiter  for review and further management.    B/P stated had B/P last morning of 87/50 P- 85

## 2024-03-20 ENCOUNTER — Telehealth: Payer: Self-pay | Admitting: *Deleted

## 2024-03-20 NOTE — Telephone Encounter (Signed)
 Copied from CRM [id number]. Topic: General - Other >> Mar 20, 2024 10:42 AM Dondra Prader A wrote: Reason for CRM: Patient states that she was talking to her PCP Rudene Christians yesterday and the phone call dropped. She tried calling her PCP back and could not get through. Patient is wanting to know when she needs to come back in for her follow up and also would like to finish speaking with her PCP.

## 2024-03-21 NOTE — Telephone Encounter (Signed)
 I asked patient to continue holding olmesartan until she comes back for a nurse only visit next week.  I asked that she bring in home blood pressure cuff and we will compare to our readings in clinic.  I also asked that she bring in [facility] receiver and we will download CGM data.  Gladys-please see this patient for a nurse only visit to recheck her blood pressure, compare her home blood pressure cuff with what we see here, and download Dexcom data.  I will be off of clinic right now but I am happy to help with interpreting the data and going over her blood pressures.  Please forward results to me.  [place]- Can you please call patient to schedule RN only visit for next week?  Thank you both, [employer]

## 2024-03-22 ENCOUNTER — Other Ambulatory Visit (HOSPITAL_COMMUNITY): Payer: Self-pay

## 2024-03-27 ENCOUNTER — Telehealth: Payer: Self-pay

## 2024-03-27 NOTE — Telephone Encounter (Signed)
 Patient informed her novo nordisk shipment is ready for pickup.  1 box of Tresiba labeled and ready in med room fridge.

## 2024-03-28 ENCOUNTER — Ambulatory Visit: Payer: Self-pay | Admitting: *Deleted

## 2024-03-28 NOTE — Progress Notes (Unsigned)
    Candace Gallus presented today for blood pressure check. Patient is not prescribed blood pressure medications and I confirmed that patient did not take their blood pressure medication prior to today's appointment. Blood pressure was taken in the usual and appropriate manner using an automated BP cuff.     Vitals:   03/28/24 1101  BP: (!) 177/85      Results of today's visit will be routed to Dr. Ashley Royalty  for review and further management.    Repeat 164/87 P 87

## 2024-03-29 ENCOUNTER — Other Ambulatory Visit (HOSPITAL_COMMUNITY): Payer: Self-pay

## 2024-03-29 NOTE — Progress Notes (Signed)
 I called and talked with patient.  Her home blood pressure readings have been <130s/80s. She no longer has episodes of dizziness since stopping olmesartan. When she stopped by clinic for a blood pressure check her in office BP was 177/85.  I asked her to monitor her blood pressure at home and to bring log as well as home blood pressure monitor at follow-up visit in one months so that we can make sure home reading is accurate.  She will also bring in dexcom receiver at that visit. She has not had hypoglycemic episodes since last visit when insulin was decreased. P: Follow-up in 4 weeks for blood pressure and review of CGM Olmesartan held, with CKD stage 4 would like to control blood pressure as able without inducing symptoms of dizziness

## 2024-04-16 ENCOUNTER — Other Ambulatory Visit (HOSPITAL_COMMUNITY): Payer: Self-pay

## 2024-04-19 ENCOUNTER — Other Ambulatory Visit (HOSPITAL_COMMUNITY): Payer: Self-pay

## 2024-04-19 NOTE — Telephone Encounter (Signed)
 Refill order request for Ozempic  rec'd and processed per rep. Should arrive in 10-14 business days.  Rep will also cancel refills of Rybelsus .

## 2024-04-24 ENCOUNTER — Telehealth: Payer: Self-pay

## 2024-04-24 NOTE — Telephone Encounter (Signed)
 In process of completing address change/refill request form for patients assistance with Novo Nordisk. Shipments take 10-14 business days to arrive at office.   Refills for:   Ozempic  2mg  dose pens Tresiba  U100 - 6 units every day   Form in Dr. Letha Rav box awaiting signature.

## 2024-04-26 ENCOUNTER — Encounter: Payer: Self-pay | Admitting: Internal Medicine

## 2024-04-26 ENCOUNTER — Ambulatory Visit (INDEPENDENT_AMBULATORY_CARE_PROVIDER_SITE_OTHER): Payer: Self-pay | Admitting: Internal Medicine

## 2024-04-26 ENCOUNTER — Other Ambulatory Visit (HOSPITAL_COMMUNITY): Payer: Self-pay

## 2024-04-26 VITALS — BP 187/86 | HR 88 | Ht 61.0 in | Wt 140.0 lb

## 2024-04-26 DIAGNOSIS — L299 Pruritus, unspecified: Secondary | ICD-10-CM | POA: Diagnosis not present

## 2024-04-26 DIAGNOSIS — E118 Type 2 diabetes mellitus with unspecified complications: Secondary | ICD-10-CM

## 2024-04-26 DIAGNOSIS — J302 Other seasonal allergic rhinitis: Secondary | ICD-10-CM | POA: Diagnosis not present

## 2024-04-26 DIAGNOSIS — Z299 Encounter for prophylactic measures, unspecified: Secondary | ICD-10-CM

## 2024-04-26 DIAGNOSIS — I10 Essential (primary) hypertension: Secondary | ICD-10-CM

## 2024-04-26 MED ORDER — OLMESARTAN MEDOXOMIL 5 MG PO TABS
5.0000 mg | ORAL_TABLET | Freq: Every day | ORAL | 2 refills | Status: DC
  Filled 2024-04-26: qty 30, 30d supply, fill #0
  Filled 2024-05-27: qty 30, 30d supply, fill #1

## 2024-04-26 MED ORDER — CETIRIZINE HCL 10 MG PO TABS: 10.0000 mg | ORAL_TABLET | Freq: Every day | ORAL | Status: DC | PRN

## 2024-04-26 MED ORDER — HYDROCORTISONE 1 % EX LOTN: 1.0000 | TOPICAL_LOTION | Freq: Two times a day (BID) | CUTANEOUS | Status: AC | PRN

## 2024-04-26 NOTE — Progress Notes (Unsigned)
 CC: follow up appointment  HPI:  Amanda [name] is a 73 y.o. with medical history of HTN, HLD, DMII, CKDIIIb, DDD presenting to [facility] for a follow up. Last seen in 02/2024 by PCP and instructed to follow up in 2 weeks for her HTN and DMII but is coming in now.   Please see problem-based list for further details, assessments, and plans.  Past Medical History:  Diagnosis Date   Allergy    SEASONAL   Arthritis    Cervical radiculopathy due to degenerative joint disease of spine 05/03/2017   CKD (chronic kidney disease) stage 3, GFR 30-59 ml/min (HCC) 01/01/2021   Diabetes mellitus 1996   Diabetic neuropathy (HCC) 05/20/2013   Glaucoma    H. pylori infection    Hyperlipidemia    Hypertension    Normocytic anemia 02/14/2013   Osteopenia 05/04/2016   Pain of both shoulder joints 11/14/2016    Current Outpatient Medications (Endocrine & Metabolic):    empagliflozin  (JARDIANCE ) 25 MG TABS tablet, Take 1 tablet (25 mg total) by mouth daily before breakfast.   glucose 4 GM chewable tablet, Chew 1 tablet (4 g total) by mouth as needed for low blood sugar.   insulin  degludec (TRESIBA  FLEXTOUCH) 100 UNIT/ML FlexTouch Pen, Inject 6 Units into the skin daily.   Semaglutide , 2 MG/DOSE, (OZEMPIC , 2 MG/DOSE,) 8 MG/3ML SOPN, Inject 2 mg into the skin once a week.  Current Outpatient Medications (Cardiovascular):    atorvastatin  (LIPITOR) 40 MG tablet, Take 1 tablet (40 mg total) by mouth daily.  Current Outpatient Medications (Respiratory):    cetirizine  (ZYRTEC  ALLERGY) 10 MG tablet, Take 1 tablet (10 mg total) by mouth daily.   fluticasone  (FLONASE ) 50 MCG/ACT nasal spray, Place 1 spray into both nostrils daily.  Current Outpatient Medications (Analgesics):    acetaminophen  (TYLENOL ) 325 MG tablet, Take 2 tablets (650 mg total) by mouth every 6 (six) hours as needed (or Fever >/= 101).   Current Outpatient Medications (Other):    blood glucose meter kit and supplies KIT, Dispense  based on patient and insurance preference. Use up to four times daily as directed. .   Continuous Blood Gluc Receiver (DEXCOM G7 RECEIVER) DEVI, Use to check blood glucose before upon waking up, meals, at bedtime, and as needed,   Continuous Glucose Sensor (DEXCOM G7 SENSOR) MISC, Apply sensor to back of arm to continuously monitor blood glucose, changing the sensor every 10 days as directed.   diclofenac  Sodium (VOLTAREN ) 1 % GEL, Apply 2 g topically daily as needed (hand pain).   glucose blood (ACCU-CHEK GUIDE) test strip, You do not medically need to check blood sugar while not taking insulin . You can check your blood sugar if you are concerned when you want, but do not need to.   hydrocortisone  1 % lotion, Apply 1 Application topically 2 (two) times daily.   hydrOXYzine  (ATARAX ) 50 MG tablet, Take 1 tablet (50 mg total) by mouth 3 (three) times daily as needed.   Insulin  Pen Needle (PEN NEEDLES) 32G X 4 MM MISC, Use as directed.   latanoprost  (XALATAN ) 0.005 % ophthalmic solution, Place 1 drop into both eyes at bedtime.   latanoprost  (XALATAN ) 0.005 % ophthalmic solution, Place 1 drop into both eyes daily.   Multiple Vitamins-Minerals (WOMENS 50+ MULTI VITAMIN/MIN) TABS, Take 1 tablet by mouth daily.  Review of Systems:  Review of system negative unless stated in the problem list or HPI.    Physical Exam:  There were no vitals filed  for this visit. Physical Exam General: NAD HENT: NCAT Lungs: CTAB, no wheeze, rhonchi or rales.  Cardiovascular: Normal heart sounds, no r/m/g, 2+ pulses in all extremities. No LE edema Abdomen: No TTP, normal bowel sounds MSK: No asymmetry or muscle atrophy.  Skin: no lesions noted on exposed skin Neuro: Alert and oriented x4. CN grossly intact Psych: Normal mood and normal affect   Assessment & Plan:   No problem-specific Assessment & Plan notes found for this encounter.   See Encounters Tab for problem based charting.  Patient Discussed with Dr.  {NAMES:[id number]::"Amanda [name]","Amanda [name]","Amanda [name]","Amanda [name]","Amanda [name]","Amanda [name]","Amanda [name]","Amanda [name]","Amanda [name]"} Amanda Masker, MD Tom[facility] Fragmin. [name] Internal [facility] Res[facility], PGY-3   DMII Jardiance  25 mg every day, Insulin  6 units, Ozempic  2 mg weekly. Last A1c was 7.0 2 months ago.   HTN Noted to be orthostatic last visit. Olmesartan  reduced from 20 mg to 10 mg. Renal fxn stable 3 months ago.    Abdominal Pain  Care Gaps ACR Foot exam TDAP

## 2024-04-26 NOTE — Patient Instructions (Signed)
 [name], it was a pleasure seeing you today! You endorsed feeling well today. Below are some of the things we talked about this visit. We look forward to seeing you in the follow up appointment!  Today we discussed: We will restart a your blood pressure medicine at lower dose as your readings at home look pretty good. It is olmesartan  5 mg daily.   Continue taking your other medicines.   We will check your urine.   For your numbness/tingling in the feet. You can try over the counter capsaicin  cream. We can start a medicine called lyrica if you want but you wanted to hold off for now.   I have ordered the following labs today:   Lab Orders         Microalbumin / Creatinine Urine Ratio       Referrals ordered today:   Referral Orders  No referral(s) requested today     I have ordered the following medication/changed the following medications:   Stop the following medications: Medications Discontinued During This Encounter  Medication Reason   cetirizine  (ZYRTEC  ALLERGY) 10 MG tablet Reorder   hydrocortisone  1 % lotion      Start the following medications: Meds ordered this encounter  Medications   cetirizine  (ZYRTEC  ALLERGY) 10 MG tablet    Sig: Take 1 tablet (10 mg total) by mouth daily as needed for allergies.   hydrocortisone  1 % lotion    Sig: Apply 1 Application topically 2 (two) times daily as needed for itching.     Follow-up: 1 month follow up for diabetes/A1c   Please make sure to arrive 15 minutes prior to your next appointment. If you arrive late, you may be asked to reschedule.   We look forward to seeing you next time. Please call our clinic at [phone] if you have any questions or concerns. The best time to call is Monday-Friday from 9am-4pm, but there is someone available 24/7. If after hours or the weekend, call the main hospital number and ask for the Internal Medicine Resident On-Call. If you need medication refills, please notify your  pharmacy one week in advance and they will send us  a request.  Thank you for letting us  take part in your care. Wishing you the best!  Thank you, [name], MD

## 2024-04-28 LAB — MICROALBUMIN / CREATININE URINE RATIO
Creatinine, Urine: 55.9 mg/dL
Microalb/Creat Ratio: 209 mg/g{creat} — ABNORMAL HIGH (ref 0–29)
Microalbumin, Urine: 116.6 ug/mL

## 2024-04-28 NOTE — Assessment & Plan Note (Signed)
 Pt reports her generalized itching has resolved.

## 2024-04-28 NOTE — Assessment & Plan Note (Signed)
 Pt with elevated blood pressure here that showed improved on repeat but was still above goal. Pt brought a log of her home readings that show very well controlled blood pressures at home (see media tab). She is currently not taking any medication for her blood pressures. She was suppose to be taking 10 mg of olmesartan  per previous visit. Will start olmesartan  at 5 mg given she had some low blood pressure readings on her log. She will benefit from ARB due to her CKD and DMII.

## 2024-04-28 NOTE — Assessment & Plan Note (Signed)
 Pt states her allergies are well controlled. Continue as needed zyrtec  and flonase  spray.

## 2024-04-28 NOTE — Assessment & Plan Note (Signed)
 Foot exam done this visit ACR done this visit.

## 2024-04-29 ENCOUNTER — Telehealth: Payer: Self-pay | Admitting: *Deleted

## 2024-04-29 ENCOUNTER — Encounter: Admitting: Internal Medicine

## 2024-04-29 NOTE — Telephone Encounter (Signed)
 Will forward to Dr. Meredeth Stallion. Copied from CRM [id number]. Topic: Clinical - Lab/Test Results >> Apr 29, 2024  3:28 PM Brynn Caras wrote: Reason for CRM: The patient completed a urine sample on 05/09 during her OV with Dr.Khan, she states she was reviewing her results through her chart and her microalbumin/creatinine levels were abnormal. Advised once her results have been reviewed by her provider she will receive a direct callback to discuss her results further. The patient understood.  Callback [phone]

## 2024-04-29 NOTE — Progress Notes (Signed)
 Internal Medicine Clinic Attending  Case discussed with the resident at the time of the visit.  We reviewed the resident's history and exam and pertinent patient test results.  I agree with the assessment, diagnosis, and plan of care documented in the resident's note.

## 2024-04-30 ENCOUNTER — Telehealth: Payer: Self-pay | Admitting: *Deleted

## 2024-04-30 ENCOUNTER — Ambulatory Visit: Payer: Self-pay | Admitting: Internal Medicine

## 2024-04-30 NOTE — Telephone Encounter (Signed)
 Copied from CRM [id number]. Topic: Clinical - Lab/Test Results >> Apr 29, 2024  3:28 PM Brynn Caras wrote: Reason for CRM: The patient completed a urine sample on 05/09 during her OV with Dr.Khan, she states she was reviewing her results through her chart and her microalbumin/creatinine levels were abnormal. Advised once her results have been reviewed by her provider she will receive a direct callback to discuss her results further. The patient understood.  Callback #:[phone] >> Apr 30, 2024 10:07 AM Tisa Forester wrote: Transfer to [facility] patient returning call regarding labs

## 2024-05-01 ENCOUNTER — Telehealth: Payer: Self-pay | Admitting: *Deleted

## 2024-05-01 NOTE — Telephone Encounter (Signed)
 Copied from CRM [id number]. Topic: Clinical - Lab/Test Results >> Apr 29, 2024  3:28 PM Brynn Caras wrote: Reason for CRM: The patient completed a urine sample on 05/09 during her OV with Dr.Khan, she states she was reviewing her results through her chart and her microalbumin/creatinine levels were abnormal. Advised once her results have been reviewed by her provider she will receive a direct callback to discuss her results further. The patient understood.  Callback #:[phone] >> Apr 30, 2024 10:07 AM Tisa Forester wrote: Transfer to [facility] patient returning call regarding labs

## 2024-05-01 NOTE — Telephone Encounter (Signed)
 Faxed addr change/ provider change form to Novo Nordisk 05/01/24.

## 2024-05-03 ENCOUNTER — Telehealth: Payer: Self-pay

## 2024-05-03 NOTE — Telephone Encounter (Signed)
 Pt LVM asking about the status of her DM shoes. Shoes are here. I called pt back to let her know and to remind her that her appointment is 05/08/24 to come pick them up.

## 2024-05-06 ENCOUNTER — Other Ambulatory Visit (HOSPITAL_COMMUNITY): Payer: Self-pay

## 2024-05-06 MED ORDER — PATADAY 0.7 % OP SOLN
1.0000 [drp] | Freq: Every day | OPHTHALMIC | 2 refills | Status: AC
  Filled 2024-05-06: qty 5, 30d supply, fill #0

## 2024-05-08 ENCOUNTER — Telehealth: Payer: Self-pay

## 2024-05-08 ENCOUNTER — Ambulatory Visit (INDEPENDENT_AMBULATORY_CARE_PROVIDER_SITE_OTHER)

## 2024-05-08 DIAGNOSIS — E118 Type 2 diabetes mellitus with unspecified complications: Secondary | ICD-10-CM | POA: Diagnosis not present

## 2024-05-08 DIAGNOSIS — M2142 Flat foot [pes planus] (acquired), left foot: Secondary | ICD-10-CM | POA: Diagnosis not present

## 2024-05-08 DIAGNOSIS — M2141 Flat foot [pes planus] (acquired), right foot: Secondary | ICD-10-CM | POA: Diagnosis not present

## 2024-05-08 DIAGNOSIS — M2042 Other hammer toe(s) (acquired), left foot: Secondary | ICD-10-CM | POA: Diagnosis not present

## 2024-05-08 DIAGNOSIS — L84 Corns and callosities: Secondary | ICD-10-CM | POA: Diagnosis not present

## 2024-05-08 DIAGNOSIS — M2041 Other hammer toe(s) (acquired), right foot: Secondary | ICD-10-CM

## 2024-05-08 NOTE — Telephone Encounter (Signed)
 Advised patient that her 4 boxes of Ozempic  2mg  dose pens are ready for pick up. Also advised her that we are in the new location and provided address.

## 2024-05-08 NOTE — Telephone Encounter (Signed)
 Attempted to contact patient regarding novo nordisk shipment. Phone rings and hangs up, no voicemail available. Will call again.  4 boxes of Ozempic  2mg  dose pens labeled and ready in med room fridge.

## 2024-05-08 NOTE — Progress Notes (Signed)

## 2024-05-09 ENCOUNTER — Ambulatory Visit: Payer: Self-pay

## 2024-05-09 ENCOUNTER — Ambulatory Visit (HOSPITAL_COMMUNITY)
Admission: EM | Admit: 2024-05-09 | Discharge: 2024-05-09 | Disposition: A | Discharge status: Home / Self Care | Attending: Internal Medicine | Admitting: Internal Medicine

## 2024-05-09 ENCOUNTER — Other Ambulatory Visit (HOSPITAL_COMMUNITY): Payer: Self-pay

## 2024-05-09 ENCOUNTER — Other Ambulatory Visit: Payer: Self-pay

## 2024-05-09 ENCOUNTER — Encounter (HOSPITAL_COMMUNITY): Payer: Self-pay | Admitting: *Deleted

## 2024-05-09 DIAGNOSIS — R82998 Other abnormal findings in urine: Secondary | ICD-10-CM | POA: Insufficient documentation

## 2024-05-09 DIAGNOSIS — I959 Hypotension, unspecified: Secondary | ICD-10-CM | POA: Insufficient documentation

## 2024-05-09 LAB — POCT URINALYSIS DIP (MANUAL ENTRY)
Bilirubin, UA: NEGATIVE
Blood, UA: NEGATIVE
Glucose, UA: 500 mg/dL — AB
Ketones, POC UA: NEGATIVE mg/dL
Nitrite, UA: NEGATIVE
Protein Ur, POC: NEGATIVE mg/dL
Spec Grav, UA: <=1.005 — AB (ref 1.010–1.025)
Urobilinogen, UA: 0.2 U/dL
pH, UA: 5.5 (ref 5.0–8.0)

## 2024-05-09 MED ORDER — CEPHALEXIN 250 MG/5ML PO SUSR
250.0000 mg | Freq: Three times a day (TID) | ORAL | 0 refills | Status: AC
  Filled 2024-05-09: qty 200, 14d supply, fill #0

## 2024-05-09 NOTE — ED Provider Notes (Signed)
 [facility]    CSN: [id number] Arrival date & time: 05/09/24  1217      History   Chief Complaint Chief Complaint  Patient presents with   Hypotension    HPI Amanda [name] is a 73 y.o. female who presents concerned due to her BP being low this am at 101/49. She states she saw her PCP a few weeks ago and her BP was elevated and she did not realize she had not been  BP med for the prior 2 months, so she was restarted on BP med and placed on Benecar 5 am a qm. After a week of this she noticed feeling fatigued, sweating and felt  ran down and when she checked her BP's at home they were low. Her glucose was 96. So she spoke to her PCP and they advised her to take 1/2 at bedtime,  so she would not have those symptoms, but a few days later her BP was still low, so she started taking 1/4 at bedtime since 5/21 and her BP ranges when she showed her her BP diaries have been from 81-105/50-62 on 5/21 Then on 5/22 89-159/49-80  This past weekend she has noticed suprapubic pressure and her urine looking darker yellow than her normal. She has been out of town, so did not seek care. She denies currently having dysuria, fever, chills or URI symptoms.  She denies having any chest pains or SOB or fainting.     Past Medical History:  Diagnosis Date   Allergy    SEASONAL   Arthritis    Bacterial sinusitis 12/09/2023   Cervical radiculopathy due to degenerative joint disease of spine 05/03/2017   CKD (chronic kidney disease) stage 3, GFR 30-59 ml/min (HCC) 01/01/2021   Diabetes mellitus 1996   Diabetic neuropathy (HCC) 05/20/2013   Glaucoma    Grief 08/07/2023   H. pylori infection    Hyperlipidemia    Hypertension    Normocytic anemia 02/14/2013   Osteopenia 05/04/2016   Pain in both lower extremities 02/14/2024   Pain of both shoulder joints 11/14/2016   Pruritus 12/09/2023    Patient Active Problem List   Diagnosis Date Noted   Eustachian tube dysfunction, bilateral  10/09/2023   Peroneal tendon injury 05/13/2023   Pain in joints of unspecified hand 05/13/2023   Financial difficulties 09/02/2022   Diabetic nephropathy with proteinuria (HCC) 05/18/2022   Iron  deficiency anemia 12/02/2021   Peripheral arterial disease (HCC) 10/28/2021   Lump in lower outer quadrant of right breast 10/28/2021   CKD stage 3b, GFR 30-44 ml/min (HCC) 01/01/2021   Osteopenia 05/04/2016   MDD (major depressive disorder), recurrent episode, moderate (HCC) 02/08/2016   Pseudophakia of both eyes 07/27/2015   Constipation 09/16/2014   Preventive measure 06/03/2013   Seasonal allergies 03/12/2013   Hyperlipidemia associated with type 2 diabetes mellitus (HCC) 02/15/2013   Type II diabetes mellitus with complication (HCC) 02/14/2013   Essential hypertension 05/10/2010    Past Surgical History:  Procedure Laterality Date   ABDOMINAL HYSTERECTOMY     10 years ago    BREAST BIOPSY     BREAST LUMPECTOMY WITH RADIOACTIVE SEED LOCALIZATION Right 02/16/2022   Procedure: RIGHT BREAST LUMPECTOMY WITH RADIOACTIVE SEED LOCALIZATION;  Surgeon: Sim Dryer, MD;  Location: [facility];  Service: General;  Laterality: Right;   Cataracts     ROTATOR CUFF REPAIR     SHOULDER SURGERY     Rotator cuff tear     OB History  Gravida  1   Para  1   Term  1   Preterm      AB      Living  1      SAB      IAB      Ectopic      Multiple      Live Births               Home Medications    Prior to Admission medications   Medication Sig Start Date End Date Taking? Authorizing Provider  acetaminophen  (TYLENOL ) 325 MG tablet Take 2 tablets (650 mg total) by mouth every 6 (six) hours as needed (or Fever >/= 101). 01/05/13  Yes Shanon Darting, MD  atorvastatin  (LIPITOR) 40 MG tablet Take 1 tablet (40 mg total) by mouth daily. 03/15/24  Yes [name], Katie, DO  blood glucose meter kit and supplies KIT Dispense based on patient and insurance preference. Use up to four times  daily as directed. . 09/30/19  Yes Cherylene Corrente, MD  cephALEXin  (KEFLEX ) 250 MG/5ML suspension Take 5 mLs (250 mg total) by mouth 3 (three) times daily for 7 days. Discard remaining. 05/09/24 05/23/24 Yes Rodriguez-Southworth, Lamond Pilot, PA-C  cetirizine  (ZYRTEC  ALLERGY) 10 MG tablet Take 1 tablet (10 mg total) by mouth daily as needed for allergies. 04/26/24 07/25/24 Yes [name], MD  Continuous Blood Gluc Receiver (DEXCOM G7 RECEIVER) DEVI Use to check blood glucose before upon waking up, meals, at bedtime, and as needed, 10/12/22  Yes   Continuous Glucose Sensor (DEXCOM G7 SENSOR) MISC Apply sensor to back of arm to continuously monitor blood glucose, changing the sensor every 10 days as directed. 02/19/24  Yes [name], Katie, DO  diclofenac  Sodium (VOLTAREN ) 1 % GEL Apply 2 g topically daily as needed (hand pain). 11/17/22  Yes Barnetta Liberty, MD  empagliflozin  (JARDIANCE ) 25 MG TABS tablet Take 1 tablet (25 mg total) by mouth daily before breakfast. 05/13/23  Yes Joette Mustard, MD  fluticasone  (FLONASE ) 50 MCG/ACT nasal spray Place 1 spray into both nostrils daily. 08/30/23  Yes Dorthy Gavia, MD  insulin  degludec (TRESIBA  FLEXTOUCH) 100 UNIT/ML FlexTouch Pen Inject 6 Units into the skin daily. 03/05/24  Yes [name], Katie, DO  Insulin  Pen Needle (PEN NEEDLES) 32G X 4 MM MISC Use as directed. 02/21/24  Yes [name], Katie, DO  latanoprost  (XALATAN ) 0.005 % ophthalmic solution Place 1 drop into both eyes at bedtime.   Yes [provider]  latanoprost  (XALATAN ) 0.005 % ophthalmic solution Place 1 drop into both eyes daily. 12/19/23  Yes   Multiple Vitamins-Minerals (WOMENS 50+ MULTI VITAMIN/MIN) TABS Take 1 tablet by mouth daily. 03/18/14  Yes Priscella Brooms, DO  olmesartan  (BENICAR ) 5 MG tablet Take 1 tablet (5 mg total) by mouth daily. 04/26/24 07/25/24 Yes [name], MD  Semaglutide , 2 MG/DOSE, (OZEMPIC , 2 MG/DOSE,) 8 MG/3ML SOPN Inject 2 mg into the skin once a week. 02/13/24  Yes Cleven Dallas, DO  glucose 4 GM chewable tablet Chew 1 tablet (4 g total) by mouth as needed for low blood sugar. 02/06/23   [name], Katie, DO  glucose blood (ACCU-CHEK GUIDE) test strip You do not medically need to check blood sugar while not taking insulin . You can check your blood sugar if you are concerned when you want, but do not need to. 03/27/23   [name], [name], DO  hydrocortisone  1 % lotion Apply 1 Application topically 2 (two) times daily as needed for itching. 04/26/24   Meredeth Stallion,  Rogelio Clas, MD  hydrOXYzine  (ATARAX ) 50 MG tablet Take 1 tablet (50 mg total) by mouth 3 (three) times daily as needed. 12/08/23   [name], MD  Olopatadine  HCl (PATADAY ) 0.7 % SOLN Place 1 drop into both eyes daily. 05/06/24     glipiZIDE  (GLUCOTROL ) 5 MG tablet Take 0.5 tablets (2.5 mg total) by mouth daily before breakfast. 10/12/20 10/28/20  Zoanne Hinders, MD    Family History Family History  Problem Relation Age of Onset   Diabetes Mother    Hypertension Mother    Breast cancer Mother    Cancer Mother    Diabetes Sister    Hypertension Sister    Kidney disease Sister        One Kidney transplant, one sister on HD   Breast cancer Sister    Cancer Sister    Hypertension Sister    Diabetes Sister    Cerebral palsy Brother    Cancer Brother        Unknown   Hypertension Daughter    Colon cancer Neg Hx    Esophageal cancer Neg Hx    Stomach cancer Neg Hx    Rectal cancer Neg Hx     Social History Social History   Tobacco Use   Smoking status: Never   Smokeless tobacco: Never  Vaping Use   Vaping status: Never Used  Substance Use Topics   Alcohol use: No    Alcohol/week: 0.0 standard drinks of alcohol   Drug use: No     Allergies   Gabapentin    Review of Systems Review of Systems As noted in HPI  Physical Exam Triage Vital Signs ED Triage Vitals  Encounter Vitals Group     BP 05/09/24 1317 (!) 164/78     Systolic BP Percentile --      Diastolic BP Percentile --      Pulse Rate  05/09/24 1317 83     Resp 05/09/24 1317 20     Temp 05/09/24 1317 98.4 F (36.9 C)     Temp src --      SpO2 05/09/24 1317 95 %     Weight --      Height --      Head Circumference --      Peak Flow --      Pain Score 05/09/24 1320 7     Pain Loc --      Pain Education --      Exclude from Growth Chart --    No data found.  Updated Vital Signs BP (!) 164/78   Pulse 83   Temp 98.4 F (36.9 C)   Resp 20   SpO2 95%  I repeated her pulse ox without  a mask and was 98%  Visual Acuity Right Eye Distance:   Left Eye Distance:   Bilateral Distance:    Right Eye Near:   Left Eye Near:    Bilateral Near:     Physical Exam  Constitutional: She is oriented to person, place, and time. She appears well-developed and well-nourished. No distress.  HENT:  Head: Normocephalic and atraumatic.  Right Ear: External ear normal.  Left Ear: External ear normal.  Nose: Nose normal.  Eyes: Conjunctivae are normal. Right eye exhibits no discharge. Left eye exhibits no discharge. No scleral icterus.  Neck: Neck supple. No thyromegaly present.  No carotid bruits bilaterally  Cardiovascular: Normal rate and regular rhythm.  No murmur heard. Pulmonary/Chest: Effort normal and breath sounds normal. No respiratory distress.  Musculoskeletal: Normal range of motion. She exhibits no edema.  Lymphadenopathy: She has no cervical adenopathy.  Neurological: She is alert and oriented to person, place, and time.  Skin: Skin is warm and dry. Capillary refill takes less than 2 seconds. No rash noted. She is not diaphoretic.  Psychiatric: She has a normal mood and affect. Her behavior is normal. Judgment and thought content normal.  Nursing note reviewed.   UC Treatments / Results  Labs (all labs ordered are listed, but only abnormal results are displayed) Labs Reviewed  POCT URINALYSIS DIP (MANUAL ENTRY) - Abnormal; Notable for the following components:      Result Value   Glucose, UA =500 (*)     Spec Grav, UA <=1.005 (*)    Leukocytes, UA Small (1+) (*)    All other components within normal limits  URINE CULTURE    EKG   Radiology No results found.  Procedures Procedures (including critical care time)  Medications Ordered in UC Medications - No data to display  Initial Impression / Assessment and Plan / UC Course  I have reviewed the triage vital signs and the nursing notes.  Pertinent labs  results that were available during my care of the patient were reviewed by me and considered in my medical decision making (see chart for details).     Final Clinical Impressions(s) / UC Diagnoses  Possible early UTI Hypotension at home, but hypertensive here  She brought her BP cuff with her today and I compared the manual reading I did with her machine and they are the same. So I believe those hypotensive numbers are true. For tonight I advised her to skip the Benecar and call her PCP in the morning for advise.  I sent her urine for a culture and in the mean time I placed her on Keflex .  Final diagnoses:  Leukocytes in urine     Discharge Instructions      Due to your blood pressure machine and the manual blood pressure I took on you today matching numbers, I believe the low readings you got at home are acurate. So for now I do not recommend you take you blood pressure medication tonight, and check it tomorrow morning and call your primary care doctor to see what they want you to do I have sent antibiotic for a bladder infection and we will call you when the urine culture comes back if we need to make changes on the medication.   If you develop chest pain, weakness, shortness or breath, nausea, severe headache, please go to the ER to have further test ran.   After eating and hydrating check your blood pressure again, and call your doctor to tell them what you did last night and ask them to move your appointment from June to sooner.    ED Prescriptions     Medication  Sig Dispense Auth. Provider   cephALEXin  (KEFLEX ) 250 MG/5ML suspension Take 5 mLs (250 mg total) by mouth 3 (three) times daily for 7 days. Discard remaining. 200 mL Rodriguez-Southworth, Lamond Pilot, PA-C      PDMP not reviewed this encounter.   Vonda Guadeloupe, PA-C 05/09/24 2114

## 2024-05-09 NOTE — Telephone Encounter (Signed)
 RTC to patient informed her through voice mail to stop her new blood pressure medication since it is leading to low blood pressure.  Asked patient to call the Clinics to see how she was doing and if she went to an Urgent Care or ED.

## 2024-05-09 NOTE — ED Triage Notes (Signed)
 PT reports this morning her BP was low. BP 101/49. Pt takes a new BP med at night. Pt now takes 1/4 of tab Bencar 5mg  at bed time.

## 2024-05-09 NOTE — Telephone Encounter (Signed)
  Chief Complaint: hypotension Symptoms: BP' systolic 80's-100, diastolic 40's-60's Frequency: one week Pertinent Negatives: Patient denies  Disposition: [x] ED /[] Urgent Care (no appt availability in office) / [] Appointment(In office/virtual)/ []  [facility]/ [] Home Care/ [] Refused Recommended Disposition /[] [facility]/ []  Follow-up with PCP Additional Notes: patient has recently resumed BP meds to protect kidneys as directed by MD.  Patient experiencing hypotension, dizziness, weakness.  Directed to go to ED, patient plans to go to UC. Patient driving at time of call with passenger in car. Requested that patient allow passenger to drive to UC. She verbalized understanding Copied from CRM [id number]. Topic: Clinical - Red Word Triage >> May 09, 2024  9:33 AM Retta Caster wrote: Red Word that prompted transfer to Nurse Triage: Low BP 81/49 88 BPM as of this morning 05/22 for 1 week Reason for Disposition  [1] Systolic BP < 80 AND [2] NOT dizzy, lightheaded or weak  Answer Assessment - Initial Assessment Questions 1. BLOOD PRESSURE: "What is the blood pressure?" "Did you take at least two measurements 5 minutes apart?"     81/49 pulse 88 2. ONSET: "When did you take your blood pressure?"     0800 today 3. HOW: "How did you obtain the blood pressure?" (e.g., visiting nurse, automatic home BP monitor)     Automatic cuff at home 4. HISTORY: "Do you have a history of low blood pressure?" "What is your blood pressure normally?"     Since Friday, six days ago, BP's have been running 90's-100's/<60's 5. MEDICINES: "Are you taking any medications for blood pressure?" If Yes, ask: "Have they been changed recently?"     Takes meds for BP, recently resumed for kidney a couple of weeks ago 6. PULSE RATE: "Do you know what your pulse rate is?"      no 7. OTHER SYMPTOMS: "Have you been sick recently?" "Have you had a recent injury?"     Has been having sinus issues and feeling dizzy  during the day  Protocols used: Blood Pressure - Low-A-AH

## 2024-05-09 NOTE — Discharge Instructions (Addendum)
 Due to your blood pressure machine and the manual blood pressure I took on you today matching numbers, I believe the low readings you got at home are acurate. So for now I do not recommend you take you blood pressure medication tonight, and check it tomorrow morning and call your primary care doctor to see what they want you to do I have sent antibiotic for a bladder infection and we will call you when the urine culture comes back if we need to make changes on the medication.   If you develop chest pain, weakness, shortness or breath, nausea, severe headache, please go to the ER to have further test ran.   After eating and hydrating check your blood pressure again, and call your doctor to tell them what you did last night and ask them to move your appointment from June to sooner.

## 2024-05-09 NOTE — ED Triage Notes (Signed)
 DOB and full name confirmed

## 2024-05-10 ENCOUNTER — Telehealth: Payer: Self-pay | Admitting: *Deleted

## 2024-05-10 LAB — URINE CULTURE

## 2024-05-10 NOTE — Telephone Encounter (Signed)
 Copied from CRM [id number]. Topic: Clinical - Medical Advice >> May 10, 2024 10:50 AM Blair Bumpers wrote: Reason for CRM: Patient states she was instructed yesterday to go to the ER but she went to urgent care instead. Patient states she was told to call her provider this morning because they took her off her blood pressure medication and put her on a different medication that is not for her blood pressure. Patient wants to speak with nurse/provider on what she needs to do and also she wants to go over some things with her provider. Please give patient a call today, if possible. CB #: [id number].

## 2024-05-13 ENCOUNTER — Ambulatory Visit (HOSPITAL_COMMUNITY): Payer: Self-pay

## 2024-05-15 ENCOUNTER — Telehealth: Payer: Self-pay | Admitting: Dietician

## 2024-05-15 NOTE — Telephone Encounter (Signed)
 Left voicemail for return call

## 2024-05-23 ENCOUNTER — Ambulatory Visit: Payer: Self-pay

## 2024-05-23 ENCOUNTER — Emergency Department (HOSPITAL_COMMUNITY)

## 2024-05-23 ENCOUNTER — Encounter (HOSPITAL_COMMUNITY): Payer: Self-pay

## 2024-05-23 ENCOUNTER — Emergency Department (HOSPITAL_COMMUNITY)
Admission: EM | Admit: 2024-05-23 | Discharge: 2024-05-23 | Disposition: A | Discharge status: Home / Self Care | Attending: Student | Admitting: Student

## 2024-05-23 ENCOUNTER — Other Ambulatory Visit: Payer: Self-pay

## 2024-05-23 DIAGNOSIS — Z794 Long term (current) use of insulin: Secondary | ICD-10-CM | POA: Insufficient documentation

## 2024-05-23 DIAGNOSIS — I10 Essential (primary) hypertension: Secondary | ICD-10-CM | POA: Diagnosis not present

## 2024-05-23 DIAGNOSIS — Z79899 Other long term (current) drug therapy: Secondary | ICD-10-CM | POA: Diagnosis not present

## 2024-05-23 DIAGNOSIS — R519 Headache, unspecified: Secondary | ICD-10-CM | POA: Diagnosis not present

## 2024-05-23 DIAGNOSIS — R030 Elevated blood-pressure reading, without diagnosis of hypertension: Secondary | ICD-10-CM | POA: Diagnosis present

## 2024-05-23 LAB — CBC
HCT: 35.4 % — ABNORMAL LOW (ref 36.0–46.0)
Hemoglobin: 11.3 g/dL — ABNORMAL LOW (ref 12.0–15.0)
MCH: 30.5 pg (ref 26.0–34.0)
MCHC: 31.9 g/dL (ref 30.0–36.0)
MCV: 95.7 fL (ref 80.0–100.0)
Platelets: 255 10*3/uL (ref 150–400)
RBC: 3.7 MIL/uL — ABNORMAL LOW (ref 3.87–5.11)
RDW: 12 % (ref 11.5–15.5)
WBC: 5.2 10*3/uL (ref 4.0–10.5)
nRBC: 0 % (ref 0.0–0.2)

## 2024-05-23 LAB — BASIC METABOLIC PANEL WITH GFR
Anion gap: 8 (ref 5–15)
BUN: 29 mg/dL — ABNORMAL HIGH (ref 8–23)
CO2: 25 mmol/L (ref 22–32)
Calcium: 9.2 mg/dL (ref 8.9–10.3)
Chloride: 105 mmol/L (ref 98–111)
Creatinine, Ser: 1.41 mg/dL — ABNORMAL HIGH (ref 0.44–1.00)
GFR, Estimated: 40 mL/min — ABNORMAL LOW (ref >60)
Glucose, Bld: 106 mg/dL — ABNORMAL HIGH (ref 70–99)
Potassium: 4.3 mmol/L (ref 3.5–5.1)
Sodium: 138 mmol/L (ref 135–145)

## 2024-05-23 MED ORDER — ACETAMINOPHEN 500 MG PO TABS
1000.0000 mg | ORAL_TABLET | Freq: Once | ORAL | Status: AC
  Administered 2024-05-23: 1000 mg via ORAL
  Filled 2024-05-23: qty 2

## 2024-05-23 MED ORDER — AMLODIPINE BESYLATE 5 MG PO TABS
10.0000 mg | ORAL_TABLET | Freq: Once | ORAL | Status: AC
  Administered 2024-05-23: 10 mg via ORAL
  Filled 2024-05-23: qty 2

## 2024-05-23 NOTE — ED Provider Notes (Signed)
 [place] EMERGENCY DEPARTMENT AT [facility] Provider Note   CSN: [id number] Arrival date & time: 05/23/24  1416     History HTN ( not on medications) Chief Complaint  Patient presents with   Hypertension    Amanda [name] is a 73 y.o. female.  73 year old female with a past medical history of prior hypertension (not on medication) presents to the ED with a chief complaint of elevated blood pressure, headache that is been ongoing for today.  Reports she woke up this morning with a headache, felt that she was somewhat off balance, worsened with any type of ambulation.  States that she was on 3 regimen for blood pressure control a year ago however these have now been discontinued.  She does reports she has been eating more sodium lately.  She did take some Tylenol  yesterday but there has not been any changes to her headache.  She reports systolics in the 200s at home.  No nausea, no vomiting, no blurry vision.  No prior history of CVA.   The history is provided by the patient.  Hypertension This is a recurrent problem. Associated symptoms include headaches. Pertinent negatives include no chest pain, no abdominal pain and no shortness of breath.       Home Medications Prior to Admission medications   Medication Sig Start Date End Date Taking? Authorizing Provider  acetaminophen  (TYLENOL ) 325 MG tablet Take 2 tablets (650 mg total) by mouth every 6 (six) hours as needed (or Fever >/= 101). 01/05/13   Shanon Darting, MD  atorvastatin  (LIPITOR) 40 MG tablet Take 1 tablet (40 mg total) by mouth daily. 03/15/24   [name], Katie, DO  blood glucose meter kit and supplies KIT Dispense based on patient and insurance preference. Use up to four times daily as directed. . 09/30/19   Cherylene Corrente, MD  cephALEXin  (KEFLEX ) 250 MG/5ML suspension Take 5 mLs (250 mg total) by mouth 3 (three) times daily for 7 days. Discard remaining. 05/09/24 05/24/24  Rodriguez-Southworth, Lamond Pilot, PA-C   cetirizine  (ZYRTEC  ALLERGY) 10 MG tablet Take 1 tablet (10 mg total) by mouth daily as needed for allergies. 04/26/24 07/25/24  [name], MD  Continuous Blood Gluc Receiver (DEXCOM G7 RECEIVER) DEVI Use to check blood glucose before upon waking up, meals, at bedtime, and as needed, 10/12/22     Continuous Glucose Sensor (DEXCOM G7 SENSOR) MISC Apply sensor to back of arm to continuously monitor blood glucose, changing the sensor every 10 days as directed. 02/19/24   [name], Katie, DO  diclofenac  Sodium (VOLTAREN ) 1 % GEL Apply 2 g topically daily as needed (hand pain). 11/17/22   Barnetta Liberty, MD  empagliflozin  (JARDIANCE ) 25 MG TABS tablet Take 1 tablet (25 mg total) by mouth daily before breakfast. 05/13/23   Joette Mustard, MD  fluticasone  (FLONASE ) 50 MCG/ACT nasal spray Place 1 spray into both nostrils daily. 08/30/23   Dorthy Gavia, MD  glucose 4 GM chewable tablet Chew 1 tablet (4 g total) by mouth as needed for low blood sugar. 02/06/23   [name], Katie, DO  glucose blood (ACCU-CHEK GUIDE) test strip You do not medically need to check blood sugar while not taking insulin . You can check your blood sugar if you are concerned when you want, but do not need to. 03/27/23   [name], [name], DO  hydrocortisone  1 % lotion Apply 1 Application topically 2 (two) times daily as needed for itching. 04/26/24   [name], MD  hydrOXYzine  (ATARAX ) 50 MG tablet  Take 1 tablet (50 mg total) by mouth 3 (three) times daily as needed. 12/08/23   [name], MD  insulin  degludec (TRESIBA  FLEXTOUCH) 100 UNIT/ML FlexTouch Pen Inject 6 Units into the skin daily. 03/05/24   [name], Katie, DO  Insulin  Pen Needle (PEN NEEDLES) 32G X 4 MM MISC Use as directed. 02/21/24   [name], Katie, DO  latanoprost  (XALATAN ) 0.005 % ophthalmic solution Place 1 drop into both eyes at bedtime.    [provider]  latanoprost  (XALATAN ) 0.005 % ophthalmic solution Place 1 drop into both eyes daily. 12/19/23     Multiple  Vitamins-Minerals (WOMENS 50+ MULTI VITAMIN/MIN) TABS Take 1 tablet by mouth daily. 03/18/14   Priscella Brooms, DO  olmesartan  (BENICAR ) 5 MG tablet Take 1 tablet (5 mg total) by mouth daily. 04/26/24 07/25/24  [name], MD  Olopatadine  HCl (PATADAY ) 0.7 % SOLN Place 1 drop into both eyes daily. 05/06/24     Semaglutide , 2 MG/DOSE, (OZEMPIC , 2 MG/DOSE,) 8 MG/3ML SOPN Inject 2 mg into the skin once a week. 02/13/24   Cleven Dallas, DO  glipiZIDE  (GLUCOTROL ) 5 MG tablet Take 0.5 tablets (2.5 mg total) by mouth daily before breakfast. 10/12/20 10/28/20  Zoanne Hinders, MD      Allergies    Gabapentin     Review of Systems   Review of Systems  Constitutional:  Negative for chills and fever.  Respiratory:  Negative for shortness of breath.   Cardiovascular:  Negative for chest pain and palpitations.  Gastrointestinal:  Negative for abdominal pain, nausea and vomiting.  Musculoskeletal:  Negative for back pain.  Neurological:  Positive for headaches. Negative for dizziness.  All other systems reviewed and are negative.   Physical Exam Updated Vital Signs BP (!) 168/90   Pulse 82   Temp 98.2 F (36.8 C) (Oral)   Resp 18   SpO2 97%  Physical Exam Vitals and nursing note reviewed.  Constitutional:      Appearance: Normal appearance.  HENT:     Head: Normocephalic and atraumatic.     Mouth/Throat:     Mouth: Mucous membranes are moist.  Eyes:     Pupils: Pupils are equal, round, and reactive to light.  Cardiovascular:     Rate and Rhythm: Normal rate.  Pulmonary:     Effort: Pulmonary effort is normal.     Breath sounds: No wheezing.  Abdominal:     General: Abdomen is flat.     Palpations: Abdomen is soft.     Tenderness: There is no abdominal tenderness.  Musculoskeletal:     Cervical back: Normal range of motion and neck supple.  Skin:    General: Skin is warm and dry.  Neurological:     Mental Status: She is alert and oriented to person, place, and time.     Comments:  Alert, oriented, thought content appropriate. Speech fluent without evidence of aphasia. Able to follow 2 step commands without difficulty.  Cranial Nerves:  II:  Peripheral visual fields grossly normal, pupils, round, reactive to light III,IV, VI: ptosis not present, extra-ocular motions intact bilaterally  V,VII: smile symmetric, facial light touch sensation equal VIII: hearing grossly normal bilaterally  IX,X: midline uvula rise  XI: bilateral shoulder shrug equal and strong XII: midline tongue extension  Motor:  5/5 in upper and lower extremities bilaterally including strong and equal grip strength and dorsiflexion/plantar flexion Sensory: light touch normal in all extremities.  Cerebellar: normal finger-to-nose with bilateral upper extremities, pronator drift negative Gait: normal  gait and balance       ED Results / Procedures / Treatments   Labs (all labs ordered are listed, but only abnormal results are displayed) Labs Reviewed  BASIC METABOLIC PANEL WITH GFR - Abnormal; Notable for the following components:      Result Value   Glucose, Bld 106 (*)    BUN 29 (*)    Creatinine, Ser 1.41 (*)    GFR, Estimated 40 (*)    All other components within normal limits  CBC - Abnormal; Notable for the following components:   RBC 3.70 (*)    Hemoglobin 11.3 (*)    HCT 35.4 (*)    All other components within normal limits    EKG None  Radiology CT Head Wo Contrast Result Date: 05/23/2024 CLINICAL DATA:  Headache. EXAM: CT HEAD WITHOUT CONTRAST TECHNIQUE: Contiguous axial images were obtained from the base of the skull through the vertex without intravenous contrast. RADIATION DOSE REDUCTION: This exam was performed according to the departmental dose-optimization program which includes automated exposure control, adjustment of the mA and/or kV according to patient size and/or use of iterative reconstruction technique. COMPARISON:  September 09, 2019 FINDINGS: Brain: No evidence of  acute infarction, hemorrhage, hydrocephalus, extra-axial collection or mass lesion/mass effect. Vascular: Marked severity bilateral cavernous carotid artery calcification is noted. Skull: Normal. Negative for fracture or focal lesion. Sinuses/Orbits: No acute finding. Other: None. IMPRESSION: No acute intracranial abnormality. Electronically Signed   By: Virgle Grime M.D.   On: 05/23/2024 16:03    Procedures Procedures    Medications Ordered in ED Medications  acetaminophen  (TYLENOL ) tablet 1,000 mg (1,000 mg Oral Given 05/23/24 1814)  amLODipine  (NORVASC ) tablet 10 mg (10 mg Oral Given 05/23/24 1846)    ED Course/ Medical Decision Making/ A&P                                 Medical Decision Making Amount and/or Complexity of Data Reviewed Labs: ordered.  Risk OTC drugs. Prescription drug management.   Patient presented to the ED with a chief complaint of elevated blood pressure at home with readings of systolics in the 200s, not currently on any medication for blood pressure control.  She does report previously being on 3 different regimens, however discontinued approximately 1 year ago.  She does report an increase in her sodium intake.  She does endorse a headache this morning, reports she feels somewhat wobbly walking around, however has not had any vision changes, no nausea, no vomiting, did not take anything for her headache.  She is here afebrile, no neck rigidity to suggest meningitis.  Blood pressure here in the ED initially was 210/95, subsequently 161/83, rechecked by me with some elevation of her blood pressure.  Given told to help address headache.  6:43 PM patient blood pressure continues to be elevated, therefore given amlodipine  10 mg to help with blood pressure control, she remains asymptomatic at this time.   7:57 PM patient blood pressure rechecked 158/90, significantly improved than her prior arrival.  She continues to be asymptomatic.  I discussed with her  following up with her PCP at her scheduled appointment on Monday and finding of her blood pressure over the weekend to keep monitoring this.  She is agreeable to plan and treatment, return precautions discussed at length.  Patient stable for discharge.   Portions of this note were generated with Scientist, clinical (histocompatibility and immunogenetics). Dictation errors may occur  despite best attempts at proofreading.   Final Clinical Impression(s) / ED Diagnoses Final diagnoses:  Elevated blood pressure reading    Rx / DC Orders ED Discharge Orders     None         Negin Hegg, PA-C 05/23/24 2007    Karlyn Overman, MD 05/24/24 [id number]

## 2024-05-23 NOTE — Telephone Encounter (Signed)
 This encounter was created in error - please disregard.

## 2024-05-23 NOTE — ED Triage Notes (Signed)
 Pt came in for hypertension that spiked this morning. Pt was taken off of blood pressure medication a couple weeks ago because it would become too low. Pt c/o headache and a slight imbalance while ambulating.

## 2024-05-23 NOTE — Addendum Note (Signed)
 Addended by: Alayne Allis on: 05/23/2024 04:34 PM   Modules accepted: Orders, Level of Service

## 2024-05-23 NOTE — Discharge Instructions (Addendum)
 Your laboratories also within normal limits today.  The CT of your head did not show any acute findings.  You will need to discuss blood pressure management with your primary care physician on your scheduled appointment on Monday morning.

## 2024-05-23 NOTE — Telephone Encounter (Addendum)
      Attempt x 3 to reach patient by phone for questions about her blood pressure. VM left with each attempt to return call to [phone]

## 2024-05-23 NOTE — Telephone Encounter (Addendum)
 FYI Only or Action Required?: FYI only for provider  Patient was last seen in primary care on 04/26/2024 by [name], MD. Called Nurse Triage reporting Hypertension, Dizziness, and Headache. Symptoms began today. Interventions attempted: Nothing. Symptoms are: rapidly worsening.  Triage Disposition: Go to ED Now (Notify PCP)  Patient/caregiver understands and will follow disposition?: Yes        Copied from CRM [id number]. Topic: Clinical - Red Word Triage >> May 23, 2024  2:03 PM Tisa Forester wrote: Red Word that prompted transfer to Nurse Triage: blood pressure  reading 203/107  with lightheaded [phone] Patient called this morning with blood pressure issues  was put in as a pink word for call back , since than it was gotten higher Reason for Disposition  [1] Systolic BP  >= 160 OR Diastolic >= 100 AND [2] cardiac (e.g., breathing difficulty, chest pain) or neurologic symptoms (e.g., new-onset blurred or double vision, unsteady gait)  Answer Assessment - Initial Assessment Questions 1. BLOOD PRESSURE: "What is the blood pressure?" "Did you take at least two measurements 5 minutes apart?"     Just took it at 1:45 pm 204/102 9:28 am 177/90 9:24 am 179/107 193/102 203/105 3. HOW: "How did you take your blood pressure?" (e.g., automatic home BP monitor, visiting nurse)     2 different machines 5. MEDICINES: "Are you taking any medicines for blood pressure?" "Have you missed any doses recently?"     Was taken off the med a few weeks ago, was placed on it and taken off of it because began to drop lower, have appt scheduled for Monday 6. OTHER SYMPTOMS: "Do you have any symptoms?" (e.g., blurred vision, chest pain, difficulty breathing, headache, weakness)     Feel off-balance don't feel dizzy, no chest pain, SOB, weakness, or blurred vision, having headache 5/10, body just feels some kind of way, just feel a little something different, like something's there  Already at hospital with mom  for her mom not herself, BP been going up all day, pt left to hospital parking lot to take BP since felt off Advised pt head directly to ED at hospital, call 911 if worsening while outside of the hospital, otherwise tell any provider she sees if symptoms worsen  Protocols used: Blood Pressure - High-A-AH

## 2024-05-24 ENCOUNTER — Ambulatory Visit: Payer: Self-pay | Admitting: *Deleted

## 2024-05-24 NOTE — Telephone Encounter (Signed)
 Amanda [name] is a 73 year old with PMH of CKD stage IV and Diabetes who called with concerns about blood pressure.   She went to ED yesterday as BP was elevated with systolic >200s with new headache.  She was given amlodipine  10 mg and BP improved. Then instructed to follow-up in clinic on Monday.  She checked BP this morning at it was 103/71, HR 97. She felt dizzy with standing earlier today.  She has not taken any medications since leaving the ED.  P: I asked that she continue to monitor blood pressure over weekend and if systolic are >150 on Saturday or Sunday she will take olmesartan  5 mg.

## 2024-05-24 NOTE — Telephone Encounter (Signed)
                   FYI Only or Action Required?: Action required by provider  Patient was last seen in primary care on 04/26/2024 by [name], MD. Called Nurse Triage reporting Medication Problem. Symptoms began today. Interventions attempted: Prescription medications: did not take benicar  5 mg today . Symptoms are: stable.  Triage Disposition: Call PCP Now  Patient/caregiver understands and will follow disposition?: yes . Patient requesting call back today .        Copied from CRM [id number]. Topic: Clinical - Medical Advice >> May 24, 2024 10:29 AM Adrianna P wrote: Reason for CRM: Patient went to er yesterday about her bp being high and she was having headaches, they gave her tylenol  and a 10 mg bp pill. She woke up with a good bp reading this morning. Patient wants to know if her bp runs high over the weekend if she needs to cut her olmesartan  (BENICAR ) 5 MG tablet in half. Please call [phone] Reason for Disposition  [1] Caller has URGENT medicine question about med that PCP or specialist prescribed AND [2] triager unable to answer question  Answer Assessment - Initial Assessment Questions 1. NAME of MEDICINE: "What medicine(s) are you calling about?"     Benicar  5 mg  2. QUESTION: "What is your question?" (e.g., double dose of medicine, side effect)     If BP elevates over weekend, should I take benicar  5 mg or take 1/2 tablet?  3. PRESCRIBER: "Who prescribed the medicine?" Reason: if prescribed by specialist, call should be referred to that group.     PCP 4. SYMPTOMS: "Do you have any symptoms?" If Yes, ask: "What symptoms are you having?"  "How bad are the symptoms (e.g., mild, moderate, severe)     None now BP 111/69 this am  5. PREGNANCY:  "Is there any chance that you are pregnant?" "When was your last menstrual period?"     Na  Please see ED visit yesterday for elevated BP with sx. Hx hypotension as well. Patient was given amlodipine  10 mg in ED yesterday  . BP this am 111/69 and did not take prescribed olmesartan  5mg . Patient would like advise what to do over weekend if BP elevated and what parameters on BP and heart rate PCP suggests. Patient requesting call back today please.  Protocols used: Medication Question Call-A-AH

## 2024-05-27 ENCOUNTER — Other Ambulatory Visit: Payer: Self-pay | Admitting: Internal Medicine

## 2024-05-27 ENCOUNTER — Encounter: Payer: Self-pay | Admitting: Student

## 2024-05-27 ENCOUNTER — Encounter: Admitting: Student

## 2024-05-27 ENCOUNTER — Other Ambulatory Visit: Payer: Self-pay

## 2024-05-27 ENCOUNTER — Ambulatory Visit (INDEPENDENT_AMBULATORY_CARE_PROVIDER_SITE_OTHER): Admitting: Student

## 2024-05-27 ENCOUNTER — Other Ambulatory Visit (HOSPITAL_COMMUNITY): Payer: Self-pay

## 2024-05-27 VITALS — BP 132/82 | HR 85 | Temp 98.0°F | Ht 61.5 in | Wt 136.2 lb

## 2024-05-27 DIAGNOSIS — Z794 Long term (current) use of insulin: Secondary | ICD-10-CM

## 2024-05-27 DIAGNOSIS — N1832 Chronic kidney disease, stage 3b: Secondary | ICD-10-CM | POA: Diagnosis not present

## 2024-05-27 DIAGNOSIS — E1122 Type 2 diabetes mellitus with diabetic chronic kidney disease: Secondary | ICD-10-CM

## 2024-05-27 DIAGNOSIS — J302 Other seasonal allergic rhinitis: Secondary | ICD-10-CM

## 2024-05-27 DIAGNOSIS — E118 Type 2 diabetes mellitus with unspecified complications: Secondary | ICD-10-CM

## 2024-05-27 DIAGNOSIS — I10 Essential (primary) hypertension: Secondary | ICD-10-CM

## 2024-05-27 DIAGNOSIS — N184 Chronic kidney disease, stage 4 (severe): Secondary | ICD-10-CM

## 2024-05-27 DIAGNOSIS — I129 Hypertensive chronic kidney disease with stage 1 through stage 4 chronic kidney disease, or unspecified chronic kidney disease: Secondary | ICD-10-CM | POA: Diagnosis not present

## 2024-05-27 DIAGNOSIS — Z7985 Long-term (current) use of injectable non-insulin antidiabetic drugs: Secondary | ICD-10-CM

## 2024-05-27 DIAGNOSIS — Z7984 Long term (current) use of oral hypoglycemic drugs: Secondary | ICD-10-CM

## 2024-05-27 MED ORDER — OLMESARTAN MEDOXOMIL 5 MG PO TABS
2.5000 mg | ORAL_TABLET | Freq: Every day | ORAL | 3 refills | Status: DC
  Filled 2024-05-27 – 2024-07-05 (×2): qty 45, 90d supply, fill #0

## 2024-05-27 MED ORDER — DEXCOM G7 SENSOR MISC
0 refills | Status: DC
  Filled 2024-05-27: qty 9, 90d supply, fill #0

## 2024-05-27 MED ORDER — EMPAGLIFLOZIN 25 MG PO TABS
25.0000 mg | ORAL_TABLET | Freq: Every day | ORAL | 3 refills | Status: AC
  Filled 2024-05-27: qty 90, 90d supply, fill #0
  Filled 2024-08-21: qty 90, 90d supply, fill #1
  Filled 2024-12-09: qty 90, 90d supply, fill #2

## 2024-05-27 NOTE — Telephone Encounter (Signed)
 Medication sent to pharmacy

## 2024-05-27 NOTE — Assessment & Plan Note (Signed)
 Reports taking diphenhydramine  on daily basis for allergies. Also takes Flonase  intermittently. Discussed stopping Benadryl  for daily allergy use and consider taking main line therapy such as Zyrtec , Allegra  or Claritin . Was dispensed Zyrtec  but not taking it anymore. No acute symptoms at this time.   Plan -STOP benadryl  daily use -Start OTC Zyrtec , Allegra  or Claritin  (info provided to patient on AVS) -Continue Flonase  (encourage daily use)

## 2024-05-27 NOTE — Assessment & Plan Note (Signed)
 Here for ED f/u, was seen in ED 6/5 for elevated BP in SBP 180-200s. Had headache at that time, got amlodipine  and BP improved to SBP 160 and discharged on no new meds. Previously prescribed olmesartan  5 mg at 5/11 OV but on 5/22 went to UC for hypotension and did not continue taking it. At this visit, currently not taking any home antihypertensives. BP on repeat today remains elevated 130-150/60-80. Patient brought home BP monitors and readings were comparable to office readings (home BP monitor 158/93 and 143/82).    Did not tolerate amlodipine  due to reported GI effects. No thiazide due to hx of orthostatic hypotension on chlorthalidone . No ACEI due to hx of dry cough on lisinopril .   Discussed with patient, she is agreeable to restart olmesartan  at lowest dose, 2.5 mg. Would like to restart ARB for renal protection given hx of microalbuminuria. Last BMP 6/5 with normal electrolytes and stable CKD3B.  Plan -Restart olmesartan  at 2.5 mg daily -Patient to monitor BP daily at home, return precautions given to call back -F/u in 2-3 weeks for BP reassess and repeat BMP

## 2024-05-27 NOTE — Telephone Encounter (Signed)
 The patient has arrived and is being seen.  Copied from CRM [id number]. Topic: General - Running Late >> May 27, 2024 10:49 AM Adrianna P wrote: Patient/patient representative is calling because they are running late for an appointment.

## 2024-05-27 NOTE — Telephone Encounter (Signed)
 4 boxes of Ozempic  2 mg given atyo pt at her OV this am.

## 2024-05-27 NOTE — Patient Instructions (Addendum)
 Thank you, [name] for allowing us  to provide your care today. Today we discussed   -DECREASE to Olmesartan  2.5 mg once a day  -Follow up in 2 weeks to recheck blood pressure and blood work -STOP Tresiba  (insulin ) -STOP benadryl  -Try taking Claritin , Allegra  or Zyrtec  for seasonal allergies along with your Flonase    Follow up: 2 weeks   Remember: Call our office if you have low blood sugar or low blood pressure  Should you have any questions or concerns please call the internal medicine clinic at [phone].    Breyah Akhter, D.O. [facility]

## 2024-05-27 NOTE — Progress Notes (Signed)
 CC: ED f/u for HTN  HPI:  [name] is a 73 y.o. female living with a history stated below and presents today for  ED f/u for HTN. Please see problem based assessment and plan for additional details.  Past Medical History:  Diagnosis Date   Allergy    SEASONAL   Arthritis    Bacterial sinusitis 12/09/2023   Cervical radiculopathy due to degenerative joint disease of spine 05/03/2017   CKD (chronic kidney disease) stage 3, GFR 30-59 ml/min (HCC) 01/01/2021   Diabetes mellitus 1996   Diabetic neuropathy (HCC) 05/20/2013   Glaucoma    Grief 08/07/2023   H. pylori infection    Hyperlipidemia    Hypertension    Normocytic anemia 02/14/2013   Osteopenia 05/04/2016   Pain in both lower extremities 02/14/2024   Pain of both shoulder joints 11/14/2016   Pruritus 12/09/2023    Current Outpatient Medications on File Prior to Visit  Medication Sig Dispense Refill   acetaminophen  (TYLENOL ) 325 MG tablet Take 2 tablets (650 mg total) by mouth every 6 (six) hours as needed (or Fever >/= 101). 60 tablet 0   atorvastatin  (LIPITOR) 40 MG tablet Take 1 tablet (40 mg total) by mouth daily. 90 tablet 2   blood glucose meter kit and supplies KIT Dispense based on patient and insurance preference. Use up to four times daily as directed. . 1 each 0   cetirizine  (ZYRTEC  ALLERGY) 10 MG tablet Take 1 tablet (10 mg total) by mouth daily as needed for allergies.     Continuous Blood Gluc Receiver (DEXCOM G7 RECEIVER) DEVI Use to check blood glucose before upon waking up, meals, at bedtime, and as needed, 1 each 0   diclofenac  Sodium (VOLTAREN ) 1 % GEL Apply 2 g topically daily as needed (hand pain). 100 g 2   fluticasone  (FLONASE ) 50 MCG/ACT nasal spray Place 1 spray into both nostrils daily. 16 g 2   glucose 4 GM chewable tablet Chew 1 tablet (4 g total) by mouth as needed for low blood sugar. 50 tablet 12   glucose blood (ACCU-CHEK GUIDE) test strip You do not medically need to check blood  sugar while not taking insulin . You can check your blood sugar if you are concerned when you want, but do not need to. 100 each 12   hydrocortisone  1 % lotion Apply 1 Application topically 2 (two) times daily as needed for itching.     hydrOXYzine  (ATARAX ) 50 MG tablet Take 1 tablet (50 mg total) by mouth 3 (three) times daily as needed. 30 tablet 0   insulin  degludec (TRESIBA  FLEXTOUCH) 100 UNIT/ML FlexTouch Pen Inject 6 Units into the skin daily. 3 mL 9   Insulin  Pen Needle (PEN NEEDLES) 32G X 4 MM MISC Use as directed. 100 each 10   latanoprost  (XALATAN ) 0.005 % ophthalmic solution Place 1 drop into both eyes at bedtime.     latanoprost  (XALATAN ) 0.005 % ophthalmic solution Place 1 drop into both eyes daily. 2.5 mL 11   Multiple Vitamins-Minerals (WOMENS 50+ MULTI VITAMIN/MIN) TABS Take 1 tablet by mouth daily. 30 tablet    olmesartan  (BENICAR ) 5 MG tablet Take 1 tablet (5 mg total) by mouth daily. 30 tablet 2   Olopatadine  HCl (PATADAY ) 0.7 % SOLN Place 1 drop into both eyes daily. 5 mL 2   Semaglutide , 2 MG/DOSE, (OZEMPIC , 2 MG/DOSE,) 8 MG/3ML SOPN Inject 2 mg into the skin once a week. 3 mL 6   [DISCONTINUED] glipiZIDE  (GLUCOTROL )  5 MG tablet Take 0.5 tablets (2.5 mg total) by mouth daily before breakfast. 15 tablet 0   No current facility-administered medications on file prior to visit.    Family History  Problem Relation Age of Onset   Diabetes Mother    Hypertension Mother    Breast cancer Mother    Cancer Mother    Diabetes Sister    Hypertension Sister    Kidney disease Sister        One Kidney transplant, one sister on HD   Breast cancer Sister    Cancer Sister    Hypertension Sister    Diabetes Sister    Cerebral palsy Brother    Cancer Brother        Unknown   Hypertension Daughter    Colon cancer Neg Hx    Esophageal cancer Neg Hx    Stomach cancer Neg Hx    Rectal cancer Neg Hx     Social History   Socioeconomic History   Marital status: Married    Spouse  name: Not on file   Number of children: 1   Years of education: Not on file   Highest education level: Not on file  Occupational History   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      The highest level of education was high school diploma.      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   Social Drivers of Health   Financial Resource Strain: Medium Risk (01/09/2024)   Overall Financial Resource Strain (CARDIA)    Difficulty of Paying Living Expenses: Somewhat hard  Food Insecurity: No Food Insecurity (01/09/2024)   Hunger Vital Sign    Worried About Running Out of Food in the Last Year: Never true    Ran Out of Food in the Last Year: Never true  Transportation Needs: No Transportation Needs (01/09/2024)   PRAPARE - Administrator, Civil Service (Medical): No    Lack of Transportation (Non-Medical): No  Physical Activity: Inactive (01/09/2024)   Exercise Vital Sign    Days of Exercise per Week: 0 days    Minutes of Exercise per Session: 0 min  Stress: Stress Concern Present (01/09/2024)   [employer] of Occupational Health - Occupational Stress Questionnaire    Feeling of Stress : To some extent  Social Connections: Moderately Integrated (01/09/2024)   Social Connection and Isolation Panel [NHANES]    Frequency of Communication with Friends and Family: More than three times a week    Frequency of Social Gatherings with  Friends and Family: More than three times a week    Attends Religious Services: More than 4 times per year    Active Member of [employer] or Organizations: Yes    Attends Banker Meetings: 1 to 4 times per year    Marital  Status: Widowed  Intimate Partner Violence: Not At Risk (01/09/2024)   Humiliation, Afraid, Rape, and Kick questionnaire    Fear of Current or Ex-Partner: No    Emotionally Abused: No    Physically Abused: No    Sexually Abused: No    Review of Systems: ROS negative except for what is noted on the assessment and plan.  Vitals:   05/27/24 1108 05/27/24 1110 05/27/24 1140  BP: (!) 152/58 (!) 144/61 132/82  Pulse: 90 86 85  Temp: 98 F (36.7 C)    TempSrc: Oral    SpO2: 100%    Weight: 136 lb 3.2 oz (61.8 kg)    Height: 5' 1.5" (1.562 m)     Physical Exam: Constitutional: well-appearing female sitting in chair comfortably, in no acute distress Cardiovascular: regular rate and rhythm Pulmonary/Chest: normal work of breathing on room air Neurological: alert & oriented x 3 Psych: pleasant mood  Assessment & Plan:   Essential hypertension Here for ED f/u, was seen in ED 6/5 for elevated BP in SBP 180-200s. Had headache at that time, got amlodipine  and BP improved to SBP 160 and discharged on no new meds. Previously prescribed olmesartan  5 mg at 5/11 OV but on 5/22 went to UC for hypotension and did not continue taking it. At this visit, currently not taking any home antihypertensives. BP on repeat today remains elevated 130-150/60-80. Patient brought home BP monitors and readings were comparable to office readings (home BP monitor 158/93 and 143/82).    Did not tolerate amlodipine  due to reported GI effects. No thiazide due to hx of orthostatic hypotension on chlorthalidone . No ACEI due to hx of dry cough on lisinopril .   Discussed with patient, she is agreeable to restart olmesartan  at lowest dose, 2.5 mg. Would like to restart ARB for renal protection  given hx of microalbuminuria. Last BMP 6/5 with normal electrolytes and stable CKD3B.  Plan -Restart olmesartan  at 2.5 mg daily -Patient to monitor BP daily at home, return precautions given to call back -F/u in 2-3 weeks for BP reassess and repeat BMP  Type II diabetes mellitus with complication (HCC) Status: controlled. Last A1c of 7.0% 2 months ago. Currently taking ozempic  2 mg, jardiance  25 mg, and decreased Tresiba  to 6 units from 8 at prior OV.  Patient endorses overnight hypoglycemic episodes of 60s. Has Dexcom, reviewed device for last 14 days: 76% in target with 1% low. Since on minimal long-acting insulin  and still has hypoglycemia, will stop insulin  therapy today.   Plan -Continue Ozempic  2 mg, Jardiance  25 mg  -STOP Tresiba   -A1c in 2 weeks  -Ophthalmology referral: done 10/2023 -Urine ACR 04/2024 elevated, restarted ARB -Continue atorvastatin  40 mg (Last LDL 76)   Seasonal allergies Reports taking diphenhydramine  on daily basis for allergies. Also takes Flonase  intermittently. Discussed stopping Benadryl  for daily allergy use and consider taking main line therapy such as Zyrtec , Allegra  or Claritin . Was dispensed Zyrtec  but not taking it anymore. No acute symptoms at this time.   Plan -STOP benadryl  daily use -Start OTC Zyrtec , Allegra  or Claritin  (info provided to patient on AVS) -Continue Flonase  (encourage daily use)    Patient discussed with Dr. Gerica Levins, D.O. [facility], PGY-2 Phone: [phone] Date 05/27/2024 Time 8:10 PM

## 2024-05-27 NOTE — Assessment & Plan Note (Signed)
 Status: controlled. Last A1c of 7.0% 2 months ago. Currently taking ozempic  2 mg, jardiance  25 mg, and decreased Tresiba  to 6 units from 8 at prior OV.  Patient endorses overnight hypoglycemic episodes of 60s. Has Dexcom, reviewed device for last 14 days: 76% in target with 1% low. Since on minimal long-acting insulin  and still has hypoglycemia, will stop insulin  therapy today.   Plan -Continue Ozempic  2 mg, Jardiance  25 mg  -STOP Tresiba   -A1c in 2 weeks  -Ophthalmology referral: done 10/2023 -Urine ACR 04/2024 elevated, restarted ARB -Continue atorvastatin  40 mg (Last LDL 76)

## 2024-05-28 ENCOUNTER — Encounter: Payer: Self-pay | Admitting: Podiatry

## 2024-05-28 ENCOUNTER — Other Ambulatory Visit (HOSPITAL_COMMUNITY): Payer: Self-pay

## 2024-05-28 ENCOUNTER — Ambulatory Visit (INDEPENDENT_AMBULATORY_CARE_PROVIDER_SITE_OTHER): Payer: Medicare Other | Admitting: Podiatry

## 2024-05-28 DIAGNOSIS — M79675 Pain in left toe(s): Secondary | ICD-10-CM | POA: Diagnosis not present

## 2024-05-28 DIAGNOSIS — M2041 Other hammer toe(s) (acquired), right foot: Secondary | ICD-10-CM | POA: Diagnosis not present

## 2024-05-28 DIAGNOSIS — M2141 Flat foot [pes planus] (acquired), right foot: Secondary | ICD-10-CM | POA: Diagnosis not present

## 2024-05-28 DIAGNOSIS — M79674 Pain in right toe(s): Secondary | ICD-10-CM

## 2024-05-28 DIAGNOSIS — M2042 Other hammer toe(s) (acquired), left foot: Secondary | ICD-10-CM

## 2024-05-28 DIAGNOSIS — E119 Type 2 diabetes mellitus without complications: Secondary | ICD-10-CM

## 2024-05-28 DIAGNOSIS — M2142 Flat foot [pes planus] (acquired), left foot: Secondary | ICD-10-CM

## 2024-05-28 DIAGNOSIS — E1142 Type 2 diabetes mellitus with diabetic polyneuropathy: Secondary | ICD-10-CM

## 2024-05-28 DIAGNOSIS — B351 Tinea unguium: Secondary | ICD-10-CM | POA: Diagnosis not present

## 2024-05-28 DIAGNOSIS — L988 Other specified disorders of the skin and subcutaneous tissue: Secondary | ICD-10-CM | POA: Diagnosis not present

## 2024-05-28 NOTE — Progress Notes (Signed)
 ANNUAL DIABETIC FOOT EXAM  Subjective: Amanda [name] presents today for annual diabetic foot exam. Chief Complaint  Patient presents with   Diabetes    Do my toenails, cut them for me.  Dr. Jannice [name] - 05/28/2023; A1c - 7.4   Patient confirms h/o diabetes.  Patient denies any h/o foot wounds.  Patient has been diagnosed with neuropathy.  [name], [name], DO is patient's PCP.  Past Medical History:  Diagnosis Date   Allergy    SEASONAL   Arthritis    Bacterial sinusitis 12/09/2023   Cervical radiculopathy due to degenerative joint disease of spine 05/03/2017   CKD (chronic kidney disease) stage 3, GFR 30-59 ml/min (HCC) 01/01/2021   Diabetes mellitus 1996   Diabetic neuropathy (HCC) 05/20/2013   Glaucoma    Grief 08/07/2023   H. pylori infection    Hyperlipidemia    Hypertension    Normocytic anemia 02/14/2013   Osteopenia 05/04/2016   Pain in both lower extremities 02/14/2024   Pain of both shoulder joints 11/14/2016   Pruritus 12/09/2023   Patient Active Problem List   Diagnosis Date Noted   Eustachian tube dysfunction, bilateral 10/09/2023   Peroneal tendon injury 05/13/2023   Pain in joints of unspecified hand 05/13/2023   Financial difficulties 09/02/2022   Diabetic nephropathy with proteinuria (HCC) 05/18/2022   Iron  deficiency anemia 12/02/2021   Peripheral arterial disease (HCC) 10/28/2021   Lump in lower outer quadrant of right breast 10/28/2021   CKD stage 3b, GFR 30-44 ml/min (HCC) 01/01/2021   Osteopenia 05/04/2016   MDD (major depressive disorder), recurrent episode, moderate (HCC) 02/08/2016   Pseudophakia of both eyes 07/27/2015   Constipation 09/16/2014   Preventive measure 06/03/2013   Seasonal allergies 03/12/2013   Hyperlipidemia associated with type 2 diabetes mellitus (HCC) 02/15/2013   Type II diabetes mellitus with complication (HCC) 02/14/2013   Essential hypertension 05/10/2010   Past Surgical History:  Procedure Laterality Date    ABDOMINAL HYSTERECTOMY     10 years ago    BREAST BIOPSY     BREAST LUMPECTOMY WITH RADIOACTIVE SEED LOCALIZATION Right 02/16/2022   Procedure: RIGHT BREAST LUMPECTOMY WITH RADIOACTIVE SEED LOCALIZATION;  Surgeon: Sim Dryer, MD;  Location: [facility] OR;  Service: General;  Laterality: Right;   Cataracts     ROTATOR CUFF REPAIR     SHOULDER SURGERY     Rotator cuff tear    Current Outpatient Medications on File Prior to Visit  Medication Sig Dispense Refill   acetaminophen  (TYLENOL ) 325 MG tablet Take 2 tablets (650 mg total) by mouth every 6 (six) hours as needed (or Fever >/= 101). 60 tablet 0   atorvastatin  (LIPITOR) 40 MG tablet Take 1 tablet (40 mg total) by mouth daily. 90 tablet 2   blood glucose meter kit and supplies KIT Dispense based on patient and insurance preference. Use up to four times daily as directed. . 1 each 0   cetirizine  (ZYRTEC  ALLERGY) 10 MG tablet Take 1 tablet (10 mg total) by mouth daily as needed for allergies.     Continuous Blood Gluc Receiver (DEXCOM G7 RECEIVER) DEVI Use to check blood glucose before upon waking up, meals, at bedtime, and as needed, 1 each 0   Continuous Glucose Sensor (DEXCOM G7 SENSOR) MISC Apply sensor to back of arm to continuously monitor blood glucose, changing the sensor every 10 days as directed. 9 each 0   diclofenac  Sodium (VOLTAREN ) 1 % GEL Apply 2 g topically daily as needed (hand pain). 100  g 2   empagliflozin  (JARDIANCE ) 25 MG TABS tablet Take 1 tablet (25 mg total) by mouth daily before breakfast. 90 tablet 3   fluticasone  (FLONASE ) 50 MCG/ACT nasal spray Place 1 spray into both nostrils daily. 16 g 2   glucose 4 GM chewable tablet Chew 1 tablet (4 g total) by mouth as needed for low blood sugar. 50 tablet 12   glucose blood (ACCU-CHEK GUIDE) test strip You do not medically need to check blood sugar while not taking insulin . You can check your blood sugar if you are concerned when you want, but do not need to. 100 each 12    hydrocortisone  1 % lotion Apply 1 Application topically 2 (two) times daily as needed for itching.     hydrOXYzine  (ATARAX ) 50 MG tablet Take 1 tablet (50 mg total) by mouth 3 (three) times daily as needed. 30 tablet 0   latanoprost  (XALATAN ) 0.005 % ophthalmic solution Place 1 drop into both eyes at bedtime.     latanoprost  (XALATAN ) 0.005 % ophthalmic solution Place 1 drop into both eyes daily. 2.5 mL 11   Multiple Vitamins-Minerals (WOMENS 50+ MULTI VITAMIN/MIN) TABS Take 1 tablet by mouth daily. 30 tablet    olmesartan  (BENICAR ) 5 MG tablet Take 0.5 tablets (2.5 mg total) by mouth daily. 45 tablet 3   Olopatadine  HCl (PATADAY ) 0.7 % SOLN Place 1 drop into both eyes daily. 5 mL 2   Semaglutide , 2 MG/DOSE, (OZEMPIC , 2 MG/DOSE,) 8 MG/3ML SOPN Inject 2 mg into the skin once a week. 3 mL 6   No current facility-administered medications on file prior to visit.    Allergies  Allergen Reactions   Gabapentin  Other (See Comments)    Pt states medication made her feel out of it.   Pt states medication made her feel out of it.     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  Tobacco Use   Smoking status: Never   Smokeless tobacco: Never  Vaping Use   Vaping status: Never Used  Substance and Sexual Activity   Alcohol use: No    Alcohol/week: 0.0 standard drinks of alcohol   Drug use: No   Sexual activity: Not Currently    Birth control/protection: Surgical   Family History  Problem Relation Age of Onset   Diabetes Mother    Hypertension Mother    Breast cancer Mother    Cancer Mother    Diabetes Sister    Hypertension Sister    Kidney disease Sister        One Kidney transplant, one sister on HD   Breast cancer Sister    Cancer Sister    Hypertension Sister    Diabetes Sister    Cerebral palsy Brother    Cancer Brother        Unknown   Hypertension Daughter    Colon cancer Neg Hx    Esophageal cancer Neg Hx    Stomach cancer  Neg Hx    Rectal cancer Neg Hx    Immunization History  Administered Date(s) Administered   Fluad Quad(high Dose 65+) 09/22/2019, 09/02/2021, 09/15/2022   Fluad Trivalent(High Dose 65+) 08/30/2023   Influenza, Seasonal, Injecte, Preservative Fre 02/14/2013   Influenza,inj,Quad PF,6+ Mos 02/19/2014, 09/16/2014, 10/27/2015, 08/15/2016, 01/29/2018, 08/28/2018, 10/12/2020   Influenza-Unspecified 10/09/2019   Moderna Sars-Covid-2 Vaccination 02/27/2020, 03/30/2020   PFIZER(Purple Top)SARS-COV-2 Vaccination 03/02/2020, 03/19/2020   Pneumococcal Conjugate-13 05/07/2018   Pneumococcal Polysaccharide-23 02/14/2013, 11/11/2019  Tdap 02/19/2014     Review of Systems: Negative except as noted in the HPI.   Objective: There were no vitals filed for this visit.  Amanda [name] is a pleasant 73 y.o. female in NAD. AAO X 3.  Diabetic foot exam was performed with the following findings:   Vascular Examination: Capillary refill time immediate b/l. Palpable pedal pulses. Pedal hair present b/l. No pain with calf compression b/l. Skin temperature gradient WNL b/l. No cyanosis or clubbing b/l. No ischemia or gangrene noted b/l. No edema noted b/l LE.  Neurological Examination: Sensation grossly intact b/l with 10 gram monofilament. Vibratory sensation intact b/l. Pt has subjective symptoms of neuropathy.  Dermatological Examination: Brownish macule noted plantar sulcus under 5th digit. Mild asymmetry, normal borders. No palpable depth, no elevation, no bleeding.  Pedal skin with normal turgor, texture and tone b/l.  No open wounds. No interdigital macerations.   Toenails 1-5 b/l thick, discolored, elongated with subungual debris and pain on dorsal palpation.   Minimal hyperkeratos(is/es) noted R 5th toe.  Musculoskeletal Examination: Muscle strength 5/5 to all lower extremity muscle groups bilaterally. Hammertoe(s) b/l 5th toes.. No pain, crepitus or joint limitation noted with ROM b/l LE.   Patient ambulates independently without assistive aids.  Radiographs: None     Lab Results  Component Value Date   HGBA1C 7.0 (A) 03/05/2024   ADA Risk Categorization: Low Risk :  Patient has all of the following: Intact protective sensation No prior foot ulcer  No severe deformity Pedal pulses present  Assessment: 1. Pain due to onychomycosis of toenails of both feet   2. Skin macule   3. Pes planus of both feet   4. Hammertoe, bilateral   5. Diabetic peripheral neuropathy associated with type 2 diabetes mellitus (HCC)   6. Encounter for diabetic foot exam (HCC)     Plan: Orders Placed This Encounter  Procedures   Ambulatory referral to Dermatology    Referral Priority:   Routine    Referral Type:   Consultation    Referral Reason:   Specialty Services Required    Referred to Provider:   Dellar Fenton, DO    Requested Specialty:   Dermatology    Number of Visits Requested:   1   AMB REFERRAL TO DERMATOLOGY  Diabetic foot examination performed today. All patient's and/or POA's questions/concerns addressed on today's visit. Toenails 1-5 debrided in length and girth without incident. Continue foot and shoe inspections daily. Monitor blood glucose per PCP/Endocrinologist's recommendations. Continue soft, supportive shoe gear daily. Report any pedal injuries to medical professional. Call office if there are any questions/concerns. -Dermatology consultation for evaluation/treatment of macule right foot. -Patient/POA to call should there be question/concern in the interim. Return in about 3 months (around 08/28/2024).  Amanda [name], DPM       LOCATION: [address] [place] [address]                   Office [phone]   [facility] LOCATION: [address] [place], [place] [address] Office [phone]

## 2024-05-28 NOTE — Progress Notes (Signed)
 Internal Medicine Clinic Attending  Case discussed with the resident at the time of the visit.  We reviewed the resident's history and exam and pertinent patient test results.  I agree with the assessment, diagnosis, and plan of care documented in the resident's note.

## 2024-05-29 ENCOUNTER — Telehealth: Payer: Self-pay | Admitting: Podiatry

## 2024-05-29 NOTE — Telephone Encounter (Signed)
 Patient stated the referral for dermatologist next available schedule is in January. Patient would like to know if it is wise to wait that long or can you refer her to another dermatologist.

## 2024-06-04 ENCOUNTER — Encounter: Payer: Self-pay | Admitting: *Deleted

## 2024-06-05 ENCOUNTER — Ambulatory Visit: Payer: Self-pay

## 2024-06-05 ENCOUNTER — Ambulatory Visit (INDEPENDENT_AMBULATORY_CARE_PROVIDER_SITE_OTHER): Admitting: Student

## 2024-06-05 ENCOUNTER — Other Ambulatory Visit (HOSPITAL_COMMUNITY): Payer: Self-pay

## 2024-06-05 VITALS — BP 149/68 | HR 91 | Temp 98.4°F | Wt 136.2 lb

## 2024-06-05 DIAGNOSIS — J302 Other seasonal allergic rhinitis: Secondary | ICD-10-CM | POA: Diagnosis not present

## 2024-06-05 DIAGNOSIS — J069 Acute upper respiratory infection, unspecified: Secondary | ICD-10-CM

## 2024-06-05 MED ORDER — FLUTICASONE PROPIONATE 50 MCG/ACT NA SUSP
1.0000 | Freq: Every day | NASAL | 2 refills | Status: AC
  Filled 2024-06-05: qty 16, 60d supply, fill #0
  Filled 2024-12-09: qty 16, 60d supply, fill #1

## 2024-06-05 MED ORDER — GUAIFENESIN ER 600 MG PO TB12
600.0000 mg | ORAL_TABLET | Freq: Two times a day (BID) | ORAL | 0 refills | Status: DC
  Filled 2024-06-05: qty 20, 10d supply, fill #0

## 2024-06-05 MED ORDER — BENZONATATE 100 MG PO CAPS
100.0000 mg | ORAL_CAPSULE | Freq: Four times a day (QID) | ORAL | 1 refills | Status: AC | PRN
  Filled 2024-06-05: qty 20, 5d supply, fill #0

## 2024-06-05 NOTE — Assessment & Plan Note (Signed)
 Endorses taking Claritin  and using Flonase  intermittently over the last 5 days. Encouraged patient to continue taking and using to help with congestion and postnasal drip. Patient also encouraged to do saline nasal rinses.  Plan - Continue Claritin  daily for at least 2 weeks to help determine if helpful for underlying seasonal allergies - Continue Flonase  daily  - Handout on saline nasal rinses provided in AVS

## 2024-06-05 NOTE — Progress Notes (Signed)
 Acute Office Visit  Subjective:     Patient ID: Amanda [name], female    DOB: [birth year], 73 y.o.   MRN: [id number]  Chief Complaint  Patient presents with   URI    Cough (somewhat better) worse at night, sore throat hurts to swallow     Patient is a 73 y.o. with a past medical history stated below who presents today for acute cough and sore throat. Please see problem based assessment and plan for additional details.     Review of Systems  Constitutional:  Positive for malaise/fatigue. Negative for chills and fever.  HENT:  Positive for congestion and sore throat.   Respiratory:  Positive for cough. Negative for shortness of breath.   Cardiovascular:  Negative for chest pain.  Gastrointestinal:  Negative for abdominal pain, diarrhea, nausea and vomiting.      Objective:    BP (!) 149/68 (BP Location: Right Arm, Patient Position: Sitting, Cuff Size: Normal)   Pulse 91   Temp 98.4 F (36.9 C) (Oral)   Wt 136 lb 3.2 oz (61.8 kg)   BMI 25.32 kg/m  BP Readings from Last 3 Encounters:  06/05/24 (!) 149/68  05/27/24 132/82  05/23/24 (!) 168/90   Wt Readings from Last 3 Encounters:  06/05/24 136 lb 3.2 oz (61.8 kg)  05/27/24 136 lb 3.2 oz (61.8 kg)  04/26/24 140 lb (63.5 kg)   Physical Exam HENT:     Head: Normocephalic and atraumatic.     Right Ear: Tympanic membrane, ear canal and external ear normal.     Left Ear: Tympanic membrane, ear canal and external ear normal.     Ears:     Comments: Does have evidence of soft cerumen in both ears.     Nose: Congestion and rhinorrhea present.     Mouth/Throat:     Mouth: Mucous membranes are moist.     Pharynx: No oropharyngeal exudate or posterior oropharyngeal erythema.   Eyes:     Pupils: Pupils are equal, round, and reactive to light.    Cardiovascular:     Rate and Rhythm: Normal rate and regular rhythm.     Pulses: Normal pulses.     Heart sounds: Normal heart sounds.  Pulmonary:     Effort: Pulmonary  effort is normal.     Breath sounds: Normal breath sounds.  Abdominal:     General: Abdomen is flat. There is no distension.     Palpations: Abdomen is soft.     Tenderness: There is no abdominal tenderness.   Musculoskeletal:        General: No swelling.   Skin:    General: Skin is warm.   Neurological:     General: No focal deficit present.     Mental Status: She is alert.   Psychiatric:        Mood and Affect: Mood normal.    No results found for any visits on 06/05/24.     Assessment & Plan:   Problem List Items Addressed This Visit     Seasonal allergies   Endorses taking Claritin  and using Flonase  intermittently over the last 5 days. Encouraged patient to continue taking and using to help with congestion and postnasal drip. Patient also encouraged to do saline nasal rinses.  Plan - Continue Claritin  daily for at least 2 weeks to help determine if helpful for underlying seasonal allergies - Continue Flonase  daily  - Handout on saline nasal rinses provided in AVS  Relevant Medications   fluticasone  (FLONASE ) 50 MCG/ACT nasal spray   Viral URI with cough - Primary   Patient presents with about 4 days of worsening cough and sore throat. Has also felt fatigued, muscle aches, slightly decreased appetite, and has had some fullness in her ears. Cough seems to be worse at night, Feels some discomfort when swallowing both liquids and solids but this has not prevented her from eating and drinking every day. Denies fever, shortness of breath, chest pain, nausea, vomiting, diarrhea, or confusion. Experienced recent family loss. Was staying in the hospital for about a week while sister was hospitalized and although she tried to wear a mask consistently she may have been exposed to something. No other known sick contacts around her with similar symptoms. Has history of seasonal allergies which she tries to treat with over the counter Claritin  and Flonase  although has been using  intermittently. Both have provided some relief.  Patient's blood pressure 149/68 similar to previous visit, HR 91, breathing with normal respiratory effort on room air, and afebrile today. Appears well overall. Physical exam was reassuring. Patient did show upper airway congestion including rhinitis and intermittent wet sounding cough. Ear exam normal, throat exam without erythema or edema. External neck exam negative for asymmetry, bulging/swelling, or any lymphadenopathy. Lung exam with normal respiratory effort and good breath sounds throughout both lungs. Skin with normal turgor. Cardiovascular and abdominal exams unremarkable.   Discussed with patient that based on clinical presentation and onset of symptoms there is high suspicion for viral URI with cough and associated pharyngitis. Patient presents with symptoms for last 4 days, mostly outside of the window for antiviral treatment. Testing for respiratory viral panel will not change plan at this time. Low suspicion for bacterial pharyngitis, bacterial sinusitis, or pneumonia. Fatigue and decreased appetite may be related to recent family loss. Discussed symptomatic treatment and often extended course of lingering cough as viral illness improves. Recommended cough suppressant for night time and expectorant during the day to help loosen mucus. Encouraged continued use of Flonase , Claritin , salt water rinses, warm liquids, good hydration, Tylenol  for muscle aches, and sinus rinses as needed. Reviewed return precautions including any shortness of breath, persistent fever, or inability to swallow liquids/solids. Patient endorsed understanding, agreeable to plan. Has follow up appointment for chronic medical conditions scheduled on 6/25 that will attend. Will check in on her symptoms at that time.  Plan - Tessalon  Perles at night for cough suppression - Mucinex during the day to help loosen mucus - Tylenol  500 mg - 650 mg q6H as needed for muscle  aches/pain - Warm liquids, salt water gargling, sinus rinses as needed - Continue daily Claritin  and daily Flonase  - Return precautions provided, recommend continued use of mask around infants or the elderly until symptoms improve - Return to clinic on 6/25 for chronic medical management or earlier as needed      Meds ordered this encounter  Medications   benzonatate  (TESSALON  PERLES) 100 MG capsule    Sig: Take 1 capsule (100 mg total) by mouth every 6 (six) hours as needed for cough. Take around bedtime/at night to help suppress cough.    Dispense:  20 capsule    Refill:  1   guaiFENesin (MUCINEX) 600 MG 12 hr tablet    Sig: Take 1 tablet (600 mg total) by mouth 2 (two) times daily. Take during the day to help cough out sputum.    Dispense:  30 tablet    Refill:  0  fluticasone  (FLONASE ) 50 MCG/ACT nasal spray    Sig: Place 1 spray into both nostrils daily.    Dispense:  16 g    Refill:  2   Patient discussed with Dr. Ancil Balzarine.  Return 06/12/2024 at 10:45 AM for follow up on chronic medical conditions.  Cecely Rengel Arellano Zameza, MD PGY-1 [facility]

## 2024-06-05 NOTE — Assessment & Plan Note (Signed)
 Patient presents with about 4 days of worsening cough and sore throat. Has also felt fatigued, muscle aches, slightly decreased appetite, and has had some fullness in her ears. Cough seems to be worse at night, Feels some discomfort when swallowing both liquids and solids but this has not prevented her from eating and drinking every day. Denies fever, shortness of breath, chest pain, nausea, vomiting, diarrhea, or confusion. Experienced recent family loss. Was staying in the hospital for about a week while sister was hospitalized and although she tried to wear a mask consistently she may have been exposed to something. No other known sick contacts around her with similar symptoms. Has history of seasonal allergies which she tries to treat with over the counter Claritin  and Flonase  although has been using intermittently. Both have provided some relief.  Patient's blood pressure 149/68 similar to previous visit, HR 91, breathing with normal respiratory effort on room air, and afebrile today. Appears well overall. Physical exam was reassuring. Patient did show upper airway congestion including rhinitis and intermittent wet sounding cough. Ear exam normal, throat exam without erythema or edema. External neck exam negative for asymmetry, bulging/swelling, or any lymphadenopathy. Lung exam with normal respiratory effort and good breath sounds throughout both lungs. Skin with normal turgor. Cardiovascular and abdominal exams unremarkable.   Discussed with patient that based on clinical presentation and onset of symptoms there is high suspicion for viral URI with cough and associated pharyngitis. Patient presents with symptoms for last 4 days, mostly outside of the window for antiviral treatment. Testing for respiratory viral panel will not change plan at this time. Low suspicion for bacterial pharyngitis, bacterial sinusitis, or pneumonia. Fatigue and decreased appetite may be related to recent family loss. Discussed  symptomatic treatment and often extended course of lingering cough as viral illness improves. Recommended cough suppressant for night time and expectorant during the day to help loosen mucus. Encouraged continued use of Flonase , Claritin , salt water rinses, warm liquids, good hydration, Tylenol  for muscle aches, and sinus rinses as needed. Reviewed return precautions including any shortness of breath, persistent fever, or inability to swallow liquids/solids. Patient endorsed understanding, agreeable to plan. Has follow up appointment for chronic medical conditions scheduled on 6/25 that will attend. Will check in on her symptoms at that time.  Plan - Tessalon  Perles at night for cough suppression - Mucinex during the day to help loosen mucus - Tylenol  500 mg - 650 mg q6H as needed for muscle aches/pain - Warm liquids, salt water gargling, sinus rinses as needed - Continue daily Claritin  and daily Flonase  - Return precautions provided, recommend continued use of mask around infants or the elderly until symptoms improve - Return to clinic on 6/25 for chronic medical management or earlier as needed

## 2024-06-05 NOTE — Patient Instructions (Addendum)
 Thank you, Ms.Patryce R Cobb for allowing us  to provide your care today. Today we discussed your cough and throat pain.     I have ordered the following labs for you:  Lab Orders  No laboratory test(s) ordered today     Tests ordered today:  None  Referrals ordered today:   Referral Orders  No referral(s) requested today     I have ordered the following medication/changed the following medications:   Stop the following medications: Medications Discontinued During This Encounter  Medication Reason   cetirizine  (ZYRTEC  ALLERGY) 10 MG tablet    fluticasone  (FLONASE ) 50 MCG/ACT nasal spray Reorder     Start the following medications: Meds ordered this encounter  Medications   benzonatate  (TESSALON  PERLES) 100 MG capsule    Sig: Take 1 capsule (100 mg total) by mouth every 6 (six) hours as needed for cough. Take around bedtime/at night to help suppress cough.    Dispense:  20 capsule    Refill:  1   guaiFENesin (MUCINEX) 600 MG 12 hr tablet    Sig: Take 1 tablet (600 mg total) by mouth 2 (two) times daily. Take during the day to help cough out sputum.    Dispense:  30 tablet    Refill:  0   fluticasone  (FLONASE ) 50 MCG/ACT nasal spray    Sig: Place 1 spray into both nostrils daily.    Dispense:  16 g    Refill:  2     Follow up: 06/12/2024 at 10:45 AM for follow up on chronic medical conditions   Remember:   Your symptoms are likely due to a viral infection that is causing cough and throat discomfort. Your cough may be worsened by seasonal allergies. After a viral infection, a cough can linger for several weeks to a few months after. Your exam today was reassuring. You can treat your symptoms with over the counter medications like cough drops, daily Claritin  and prescription medications listed below. You can also drink warm liquids and gargle salt water to help with the throat inflammation. Instructions for sinus rinses are attached. You can also take Tylenol  500 or 650 mg  every 6 hours as needed for headache or body aches.   - Continue Flonase  nasal spray daily  - Take Mucinex 600 mg twice a day during the day to help get out phlegm with cough - Take Tessalon  Perles 100 mg every 6 hours at night time to help suppress the cough and help you sleep   We will check on your symptoms at your 6/25 visit. For now, there is low concern for a bacterial infection and you are outside of the window for antiviral treatment should your symptoms be related to flu or COVID. We recommend continuing to wear a mask around others, especially infants and the elderly, for about another week.   Please be aware that reasons to return to clinic or go to urgent care or the ED include shortness of breath, fever, or inability to keep drinks and food down.   We hope you feel better! Should you have any questions or concerns please call the internal medicine clinic at [phone].     Nicklaus Alviar Arellano Zameza, MD PGY-1 Internal Medicine Teaching Prog[facility]

## 2024-06-05 NOTE — Telephone Encounter (Signed)
 FYI Only or Action Required?: FYI only for provider  Patient was last seen in primary care on 05/27/2024 by Jearldine Mina, DO. Called Nurse Triage reporting Sore Throat. Symptoms began x 3 days. Interventions attempted: OTC medications: tylenol  & cough drops. Symptoms are: gradually worsening.  Triage Disposition: See PCP When Office is Open (Within 3 Days)  Patient/caregiver understands and will follow disposition?: Yes   Copied from CRM [id number]. Topic: Clinical - Red Word Triage >> Jun 05, 2024  8:41 AM Karole Pacer C wrote: Red Word that prompted transfer to Nurse Triage: Patient states for the last 3 days she has been having flu like symptoms: Body aches, sore throat, cough, ear pain. Patient states is feels like an upper respiratory problem Reason for Disposition  Cough has been present for > 3 weeks  [1] Sore throat with cough/cold symptoms AND [2] present > 5 days  Answer Assessment - Initial Assessment Questions 1. ONSET: When did the throat start hurting? (Hours or days ago)      X 3 days 2. SEVERITY: How bad is the sore throat? (Scale 1-10; mild, moderate or severe)   - MILD (1-3):  Doesn't interfere with eating or normal activities.   - MODERATE (4-7): Interferes with eating some solids and normal activities.   - SEVERE (8-10):  Excruciating pain, interferes with most normal activities.   - SEVERE WITH DYSPHAGIA (10): Can't swallow liquids, drooling.     moderate 3. STREP EXPOSURE: Has there been any exposure to strep within the past week? If Yes, ask: What type of contact occurred?      N/a 4.  VIRAL SYMPTOMS: Are there any symptoms of a cold, such as a runny nose, cough, hoarse voice or red eyes?      Cough-clear,bilateral ear pain, runny nose, body aches 5. FEVER: Do you have a fever? If Yes, ask: What is your temperature, how was it measured, and when did it start?     no 6. PUS ON THE TONSILS: Is there pus on the tonsils in the back of your throat?      unknown 7. OTHER SYMPTOMS: Do you have any other symptoms? (e.g., difficulty breathing, headache, rash)     no 8. PREGNANCY: Is there any chance you are pregnant? When was your last menstrual period?     N/a  Answer Assessment - Initial Assessment Questions 1. ONSET: When did the cough begin?      X 3 days 2. SEVERITY: How bad is the cough today?      Mild to moderate 3. SPUTUM: Describe the color of your sputum (none, dry cough; clear, white, yellow, green)     clear 4. HEMOPTYSIS: Are you coughing up any blood? If so ask: How much? (flecks, streaks, tablespoons, etc.)     no 5. DIFFICULTY BREATHING: Are you having difficulty breathing? If Yes, ask: How bad is it? (e.g., mild, moderate, severe)    - MILD: No SOB at rest, mild SOB with walking, speaks normally in sentences, can lie down, no retractions, pulse < 100.    - MODERATE: SOB at rest, SOB with minimal exertion and prefers to sit, cannot lie down flat, speaks in phrases, mild retractions, audible wheezing, pulse 100-120.    - SEVERE: Very SOB at rest, speaks in single words, struggling to breathe, sitting hunched forward, retractions, pulse > 120      no 6. FEVER: Do you have a fever? If Yes, ask: What is your temperature, how was it measured, and  when did it start?     no 7. CARDIAC HISTORY: Do you have any history of heart disease? (e.g., heart attack, congestive heart failure)      N/a 8. LUNG HISTORY: Do you have any history of lung disease?  (e.g., pulmonary embolus, asthma, emphysema)     N/a 9. PE RISK FACTORS: Do you have a history of blood clots? (or: recent major surgery, recent prolonged travel, bedridden)     N/a 10. OTHER SYMPTOMS: Do you have any other symptoms? (e.g., runny nose, wheezing, chest pain)       Runny nose, body aches, bilateral ear pain 11. PREGNANCY: Is there any chance you are pregnant? When was your last menstrual period?       N/a 12. TRAVEL: Have you  traveled out of the country in the last month? (e.g., travel history, exposures)       N/a  Protocols used: Sore Throat-A-AH, Cough - Acute Non-Productive-A-AH

## 2024-06-12 ENCOUNTER — Inpatient Hospital Stay: Payer: Self-pay | Admitting: Student

## 2024-06-18 ENCOUNTER — Encounter

## 2024-06-18 NOTE — Telephone Encounter (Signed)
 Due to provider changing and location changing, pages 7-9 of original app will need to be sent instead of refill form.  In process of completing this. App in [facility] box.  Only for Ozempic  2mg  dose pens.  Tresiba  stopped at previous appt.

## 2024-06-19 NOTE — Telephone Encounter (Signed)
 Form signed by Dr. Rosan. Faxed to [facility]

## 2024-06-24 NOTE — Telephone Encounter (Signed)
 Per rep: Aware that tresiba  refills not needed at this moment. Ozempic  shipment will process on 07/22/24.

## 2024-07-05 ENCOUNTER — Other Ambulatory Visit (HOSPITAL_COMMUNITY): Payer: Self-pay

## 2024-07-08 ENCOUNTER — Other Ambulatory Visit (HOSPITAL_COMMUNITY): Payer: Self-pay

## 2024-07-09 ENCOUNTER — Other Ambulatory Visit (HOSPITAL_COMMUNITY): Payer: Self-pay

## 2024-07-09 ENCOUNTER — Other Ambulatory Visit: Payer: Self-pay

## 2024-07-09 ENCOUNTER — Other Ambulatory Visit: Payer: Self-pay | Admitting: *Deleted

## 2024-07-09 MED ORDER — DEXCOM G7 RECEIVER DEVI
0 refills | Status: DC
  Filled 2024-07-09: qty 1, 90d supply, fill #0

## 2024-07-09 NOTE — Telephone Encounter (Signed)
 Copied from CRM [id number]. Topic: Clinical - Medication Question >> Jul 09, 2024  9:46 AM Merlynn A wrote: Reason for CRM: Patient misplaced Continuous Blood Gluc Receiver (DEXCOM G7 RECEIVER) DEVI. Unable to locate device. Patient is requesting replacement. Please submit request for replacement for patient. Patient is scheduled for appointment for tomorrow 07/10/24. Please contact patient at [phone] with any questions.

## 2024-07-10 ENCOUNTER — Encounter

## 2024-07-10 NOTE — Assessment & Plan Note (Signed)
 Patient was restarted on Olmesartan  2.5 mg daily following her [facility] visit on 05/27/2024.  Today she reports complete adherence to this regimen.  Home blood pressures have been running ...  Today in the office blood pressure is ... and ... on repeat. In the past, she has not able to tolerate Amlodipine  due to GI effects. She cannot take thiazides due to history of orthostatic hypotension on chlorthalidone . She is unable to tolerate ACE inhibitor due to history of dry cough on Lisinopril .  Today we will plan to ....  We will repeat BMP following initiation of her ARB. ... - f/u BMP results

## 2024-07-10 NOTE — Assessment & Plan Note (Signed)
 Patient has a history of PAD.  Last lipid panel obtained 12/2023 was 76.  She takes atorvastatin  40 mg daily and reports complete adherence to this regimen.  We will obtain an updated lipid panel today and if LDL is still not below goal < ??? we will consider up titration. - f/u lipid panel results

## 2024-07-10 NOTE — Assessment & Plan Note (Signed)
 Today A1c of ???  Increase/decrease from 02/2024 which was 7.0.  At her last visit in the [facility] on 05/27/2024 she was discontinued from insulin  therapy due to hypoglycemic episodes of 60s seen on Dexcom device.  She reports complete adherence to her regimen Ozempic  2 mg weekly and Jardiance  25 mg daily. ???

## 2024-07-10 NOTE — Progress Notes (Deleted)
 Patient name: Amanda [name] Date of birth: 1951-07-05 Date of visit: 07/10/24  Type of visit: Established Patient Office Visit   Subjective   Chief concern: No chief complaint on file.   Amanda [name] is a 73 y.o. female with a history of HTN, DMII and CKD3b, who presents to [facility] clinic {imcrsn:[id number]}  She last was seen in the [facility] on 05/27/24 for ED f/u following presentation to the ED for elevated BP with systolics to the 180-200s. In the past, she is not able to tolerate Amlodipine  due to GI effects. Cannot take thiazide due to history of orthostatic hypotension on chlorthalidone . She is unable to tolerate ACE inhibitor due to history of dry cough on lisinopril .  At that time she resumed olmesartan  2.5 mg daily and was given strict return precautions.  She was supposed to follow-up in the [facility] 2-3 weeks later for blood pressure recheck and repeat BMP.   Today she reports home blood pressures ranging from ***.  She did *** bring her home blood pressure log.  A1c in 02/2024 was 7.0.  At her visit in the [facility] on 05/27/2024 she was discontinued from insulin  therapy due to hypoglycemic episodes of 60s seen on Dexcom device.  She reports complete adherence to her regimen of Ozempic  2 mg weekly and Jardiance  25 mg daily.  Diabetic eye exam completed November 2024.  Due to elevated urine ACR in 04/2024, patient was restarted on olmesartan .  She currently takes atorvastatin  40 mg, last LDL in 12/2023 was 76.   - J8R - BMP, lipid panel - offer Tdap  ***ROS: Denies headaches, dizziness, fever, chills, runny nose, sore throat, vision changes, hearing changes, chest pain, shortness of breath, difficulty breathing, nausea, vomiting, abdominal pain. Denies increased urinary frequency, pain with urination, constipation or diarrhea. No recent falls.   Patient Active Problem List   Diagnosis Date Noted   Eustachian tube dysfunction, bilateral 10/09/2023   Peroneal tendon injury 05/13/2023   Pain in joints  of unspecified hand 05/13/2023   Financial difficulties 09/02/2022   Diabetic nephropathy with proteinuria (HCC) 05/18/2022   Iron  deficiency anemia 12/02/2021   Peripheral arterial disease (HCC) 10/28/2021   Lump in lower outer quadrant of right breast 10/28/2021   CKD stage 3b, GFR 30-44 ml/min (HCC) 01/01/2021   Viral URI with cough 12/20/2016   Osteopenia 05/04/2016   MDD (major depressive disorder), recurrent episode, moderate (HCC) 02/08/2016   Pseudophakia of both eyes 07/27/2015   Constipation 09/16/2014   Preventive measure 06/03/2013   Seasonal allergies 03/12/2013   Hyperlipidemia associated with type 2 diabetes mellitus (HCC) 02/15/2013   Type II diabetes mellitus with complication (HCC) 02/14/2013   Essential hypertension 05/10/2010     Past Surgical History:  Procedure Laterality Date   ABDOMINAL HYSTERECTOMY     10 years ago    BREAST BIOPSY     BREAST LUMPECTOMY WITH RADIOACTIVE SEED LOCALIZATION Right 02/16/2022   Procedure: RIGHT BREAST LUMPECTOMY WITH RADIOACTIVE SEED LOCALIZATION;  Surgeon: Vanderbilt Ned, MD;  Location: [facility] OR;  Service: General;  Laterality: Right;   Cataracts     ROTATOR CUFF REPAIR     SHOULDER SURGERY     Rotator cuff tear      Current Outpatient Medications  Medication Instructions   acetaminophen  (TYLENOL ) 650 mg, Oral, Every 6 hours PRN   atorvastatin  (LIPITOR) 40 mg, Oral, Daily   benzonatate  (TESSALON  PERLES) 100 mg, Oral, Every 6 hours PRN, Take around bedtime/at night to help suppress cough.  blood glucose meter kit and supplies KIT Dispense based on patient and insurance preference. Use up to four times daily as directed. .   Continuous Glucose Receiver (DEXCOM G7 RECEIVER) DEVI Use to check blood glucose before upon waking up, meals, at bedtime, and as needed,   Continuous Glucose Sensor (DEXCOM G7 SENSOR) MISC Apply sensor to back of arm to continuously monitor blood glucose, changing the sensor every 10 days as directed.    diclofenac  Sodium (VOLTAREN ) 2 g, Topical, Daily PRN   fluticasone  (FLONASE ) 50 MCG/ACT nasal spray 1 spray, Each Nare, Daily   FT Mucus Relief 12HR 600 mg, Oral, 2 times daily, Take during the day to help cough out sputum.   glucose blood (ACCU-CHEK GUIDE) test strip You do not medically need to check blood sugar while not taking insulin . You can check your blood sugar if you are concerned when you want, but do not need to.   glucose 4 g, Oral, As needed   hydrocortisone  1 % lotion 1 Application, Topical, 2 times daily PRN   hydrOXYzine  (ATARAX ) 50 mg, Oral, 3 times daily PRN   Jardiance  25 mg, Oral, Daily before breakfast   latanoprost  (XALATAN ) 0.005 % ophthalmic solution 1 drop, Daily at bedtime   latanoprost  (XALATAN ) 0.005 % ophthalmic solution 1 drop, Both Eyes, Daily   Multiple Vitamins-Minerals (WOMENS 50+ MULTI VITAMIN/MIN) TABS 1 tablet, Oral, Daily   olmesartan  (BENICAR ) 5 MG tablet Take 1/2 tablet (2.5 mg total) by mouth daily.   Olopatadine  HCl (PATADAY ) 0.7 % SOLN 1 drop, Both Eyes, Daily   Ozempic  (2 MG/DOSE) 2 mg, Subcutaneous, Weekly    Social History   Tobacco Use   Smoking status: Never   Smokeless tobacco: Never  Vaping Use   Vaping status: Never Used  Substance Use Topics   Alcohol use: No    Alcohol/week: 0.0 standard drinks of alcohol   Drug use: No      Objective  There were no vitals filed for this visit.There is no height or weight on file to calculate BMI.   Physical Exam:   Constitutional: well-appearing *** sitting in exam chair, in no acute distress. Ambulates without use of assistance device *** HEENT: normocephalic atraumatic, mucous membranes moist Eyes: conjunctiva non-erythematous Neck: supple Cardiovascular: regular rate and rhythm, bilateral radial pulses 2+, bilateral dorsal pedal pulses 2+, brisk capillary refill bilateral feet and hands  Pulmonary/Chest: normal work of breathing on room air, lungs clear to auscultation  bilaterally Abdominal: soft, non-tender, non-distended MSK: normal bulk and tone. Neurological: alert & oriented x 3, sensation intact bilateral feet to monofilament*** Skin: warm and dry, no ulcers or lesions on bilateral feet*** Psych: mood calm, behavior normal, thought content normal, judgement normal    {Labs (Optional):23779}  The 10-year ASCVD risk score (Arnett DK, et al., 2019) is: 29.7%   Values used to calculate the score:     Age: 89 years     Clincally relevant sex: Female     Is Non-Hispanic African American: Yes     Diabetic: Yes     Tobacco smoker: No     Systolic Blood Pressure: 149 mmHg     Is BP treated: Yes     HDL Cholesterol: 69 mg/dL     Total Cholesterol: 156 mg/dL      Assessment & Plan  Problem List Items Addressed This Visit       Cardiovascular and Mediastinum   Essential hypertension (Chronic)   Patient was restarted on Olmesartan  2.5 mg  daily following her [facility] visit on 05/27/2024.  Today she reports complete adherence to this regimen.  Home blood pressures have been running ...  Today in the office blood pressure is ... and ... on repeat. In the past, she has not able to tolerate Amlodipine  due to GI effects. She cannot take thiazides due to history of orthostatic hypotension on chlorthalidone . She is unable to tolerate ACE inhibitor due to history of dry cough on Lisinopril .  Today we will plan to ....  We will repeat BMP following initiation of her ARB. ... - f/u BMP results         Endocrine   Type II diabetes mellitus with complication (HCC) - Primary   Today A1c of ???  Increase/decrease from 02/2024 which was 7.0.  At her last visit in the [facility] on 05/27/2024 she was discontinued from insulin  therapy due to hypoglycemic episodes of 60s seen on Dexcom device.  She reports complete adherence to her regimen Ozempic  2 mg weekly and Jardiance  25 mg daily. ???      Hyperlipidemia associated with type 2 diabetes mellitus (HCC)   Patient has a history  of PAD.  Last lipid panel obtained 12/2023 was 76.  She takes atorvastatin  40 mg daily and reports complete adherence to this regimen.  We will obtain an updated lipid panel today and if LDL is still not below goal < ??? we will consider up titration. - f/u lipid panel results        No follow-ups on file.  Patient discussed with Dr. {[id number]}, who also saw and evaluated the patient.  Doyal Miyamoto, MD [facility]  PGY-1 07/10/2024, 8:28 AM

## 2024-07-12 ENCOUNTER — Emergency Department (HOSPITAL_COMMUNITY)
Admission: EM | Admit: 2024-07-12 | Discharge: 2024-07-12 | Disposition: A | Discharge status: Home / Self Care | Attending: Emergency Medicine | Admitting: Emergency Medicine

## 2024-07-12 ENCOUNTER — Other Ambulatory Visit: Payer: Self-pay

## 2024-07-12 ENCOUNTER — Encounter (HOSPITAL_COMMUNITY): Payer: Self-pay

## 2024-07-12 ENCOUNTER — Emergency Department (HOSPITAL_COMMUNITY)

## 2024-07-12 DIAGNOSIS — R519 Headache, unspecified: Secondary | ICD-10-CM | POA: Diagnosis present

## 2024-07-12 DIAGNOSIS — E1122 Type 2 diabetes mellitus with diabetic chronic kidney disease: Secondary | ICD-10-CM | POA: Diagnosis not present

## 2024-07-12 DIAGNOSIS — I129 Hypertensive chronic kidney disease with stage 1 through stage 4 chronic kidney disease, or unspecified chronic kidney disease: Secondary | ICD-10-CM | POA: Diagnosis not present

## 2024-07-12 DIAGNOSIS — N189 Chronic kidney disease, unspecified: Secondary | ICD-10-CM | POA: Insufficient documentation

## 2024-07-12 DIAGNOSIS — U071 COVID-19: Secondary | ICD-10-CM | POA: Diagnosis not present

## 2024-07-12 DIAGNOSIS — Z79899 Other long term (current) drug therapy: Secondary | ICD-10-CM | POA: Diagnosis not present

## 2024-07-12 LAB — CBC WITH DIFFERENTIAL/PLATELET
Abs Immature Granulocytes: 0.01 K/uL (ref 0.00–0.07)
Basophils Absolute: 0 K/uL (ref 0.0–0.1)
Basophils Relative: 0 %
Eosinophils Absolute: 0 K/uL (ref 0.0–0.5)
Eosinophils Relative: 0 %
HCT: 34 % — ABNORMAL LOW (ref 36.0–46.0)
Hemoglobin: 10.9 g/dL — ABNORMAL LOW (ref 12.0–15.0)
Immature Granulocytes: 0 %
Lymphocytes Relative: 33 %
Lymphs Abs: 1.5 K/uL (ref 0.7–4.0)
MCH: 29.9 pg (ref 26.0–34.0)
MCHC: 32.1 g/dL (ref 30.0–36.0)
MCV: 93.2 fL (ref 80.0–100.0)
Monocytes Absolute: 0.6 K/uL (ref 0.1–1.0)
Monocytes Relative: 13 %
Neutro Abs: 2.4 K/uL (ref 1.7–7.7)
Neutrophils Relative %: 54 %
Platelets: 241 K/uL (ref 150–400)
RBC: 3.65 MIL/uL — ABNORMAL LOW (ref 3.87–5.11)
RDW: 11.9 % (ref 11.5–15.5)
WBC: 4.5 K/uL (ref 4.0–10.5)
nRBC: 0 % (ref 0.0–0.2)

## 2024-07-12 LAB — COMPREHENSIVE METABOLIC PANEL WITH GFR
ALT: 13 U/L (ref 0–44)
AST: 22 U/L (ref 15–41)
Albumin: 3.7 g/dL (ref 3.5–5.0)
Alkaline Phosphatase: 54 U/L (ref 38–126)
Anion gap: 8 (ref 5–15)
BUN: 35 mg/dL — ABNORMAL HIGH (ref 8–23)
CO2: 23 mmol/L (ref 22–32)
Calcium: 8.8 mg/dL — ABNORMAL LOW (ref 8.9–10.3)
Chloride: 106 mmol/L (ref 98–111)
Creatinine, Ser: 1.79 mg/dL — ABNORMAL HIGH (ref 0.44–1.00)
GFR, Estimated: 30 mL/min — ABNORMAL LOW (ref >60)
Glucose, Bld: 110 mg/dL — ABNORMAL HIGH (ref 70–99)
Potassium: 4 mmol/L (ref 3.5–5.1)
Sodium: 137 mmol/L (ref 135–145)
Total Bilirubin: 0.9 mg/dL (ref 0.0–1.2)
Total Protein: 7.2 g/dL (ref 6.5–8.1)

## 2024-07-12 LAB — URINALYSIS, ROUTINE W REFLEX MICROSCOPIC
Bilirubin Urine: NEGATIVE
Glucose, UA: >=500 mg/dL — AB
Hgb urine dipstick: NEGATIVE
Ketones, ur: 5 mg/dL — AB
Nitrite: NEGATIVE
Protein, ur: 30 mg/dL — AB
Specific Gravity, Urine: 1.012 (ref 1.005–1.030)
pH: 5 (ref 5.0–8.0)

## 2024-07-12 LAB — TROPONIN I (HIGH SENSITIVITY): Troponin I (High Sensitivity): 7 ng/L (ref <18)

## 2024-07-12 LAB — CBG MONITORING, ED: Glucose-Capillary: 104 mg/dL — ABNORMAL HIGH (ref 70–99)

## 2024-07-12 LAB — RESP PANEL BY RT-PCR (RSV, FLU A&B, COVID)  RVPGX2
Influenza A by PCR: NEGATIVE
Influenza B by PCR: NEGATIVE
Resp Syncytial Virus by PCR: NEGATIVE
SARS Coronavirus 2 by RT PCR: POSITIVE — AB

## 2024-07-12 MED ORDER — LACTATED RINGERS IV BOLUS
1000.0000 mL | Freq: Once | INTRAVENOUS | Status: AC
  Administered 2024-07-12: 1000 mL via INTRAVENOUS

## 2024-07-12 NOTE — ED Provider Triage Note (Signed)
 Emergency Medicine Provider Triage Evaluation Note  Amanda [name] , a 72 y.o. female  was evaluated in triage.  Pt complains of URI Sx, hypotension.  Took benicar  this am  Review of Systems  Positive: Chills, HA, cough, congestion, body aches, ear pain, nausea, reflux, dizziness with standing, appetite change Negative: V/D, SHOB, CP, abd pain, trauma  Physical Exam  BP (!) 85/53   Pulse 95   Temp 98.3 F (36.8 C) (Oral)   Resp 18   SpO2 100%  Gen:   Awake, no distress   Resp:  Normal effort  MSK:   Moves extremities without difficulty  Other:  No BLE edema, A&Ox3  Medical Decision Making  Medically screening exam initiated at 2:03 PM.  Appropriate orders placed.  Amanda [name] was informed that the remainder of the evaluation will be completed by another provider, this initial triage assessment does not replace that evaluation, and the importance of remaining in the ED until their evaluation is complete.  Labs and imaging ordered   Amanda Ileana SAILOR, PA-C 07/12/24 1407

## 2024-07-12 NOTE — ED Provider Notes (Signed)
 [place] EMERGENCY DEPARTMENT AT [facility] Provider Note   CSN: [id number] Arrival date & time: 07/12/24  1332     Patient presents with: Hypotension and Fatigue   Amanda [name] is a 73 y.o. female.  With a past medical history of hypertension, type 2 diabetes, CKD and iron  deficiency anemia presents to the ED given concern for general malaise.  Patient reports headache, body aches, abdominal pain and nausea for the last 3 days.  Checked her blood pressure at home and noted to be much lower than usual with systolic blood pressure in the 70s.  Again, repeat prompting her to seek evaluation in the ED.  No chest pain shortness of breath vomiting, fevers, chills or changes in bowel habits.  Denies urinary symptoms.   HPI     Prior to Admission medications   Medication Sig Start Date End Date Taking? Authorizing Provider  acetaminophen  (TYLENOL ) 325 MG tablet Take 2 tablets (650 mg total) by mouth every 6 (six) hours as needed (or Fever >/= 101). 01/05/13   Dianna Pao, MD  atorvastatin  (LIPITOR) 40 MG tablet Take 1 tablet (40 mg total) by mouth daily. 03/15/24   [name], Katie, DO  benzonatate  (TESSALON  PERLES) 100 MG capsule Take 1 capsule (100 mg total) by mouth every 6 (six) hours as needed for cough. Take around bedtime/at night to help suppress cough. 06/05/24 06/05/25  Arellano Zameza, Priscila, MD  blood glucose meter kit and supplies KIT Dispense based on patient and insurance preference. Use up to four times daily as directed. . 09/30/19   Francesco Elsie NOVAK, MD  Continuous Glucose Receiver (DEXCOM G7 RECEIVER) DEVI Use to check blood glucose before upon waking up, meals, at bedtime, and as needed, 07/09/24   Elnora Ip, MD  Continuous Glucose Sensor (DEXCOM G7 SENSOR) MISC Apply sensor to back of arm to continuously monitor blood glucose, changing the sensor every 10 days as directed. 05/27/24   [name], Katie, DO  diclofenac  Sodium (VOLTAREN ) 1 % GEL Apply 2  g topically daily as needed (hand pain). 11/17/22   Lemon Raisin, MD  empagliflozin  (JARDIANCE ) 25 MG TABS tablet Take 1 tablet (25 mg total) by mouth daily before breakfast. 05/27/24   Elicia Sharper, DO  fluticasone  (FLONASE ) 50 MCG/ACT nasal spray Place 1 spray into both nostrils daily. 06/05/24   Arellano Zameza, Priscila, MD  glucose 4 GM chewable tablet Chew 1 tablet (4 g total) by mouth as needed for low blood sugar. 02/06/23   [name], Izetta, DO  glucose blood (ACCU-CHEK GUIDE) test strip You do not medically need to check blood sugar while not taking insulin . You can check your blood sugar if you are concerned when you want, but do not need to. 03/27/23   [name], Izetta, DO  guaiFENesin  (MUCINEX ) 600 MG 12 hr tablet Take 1 tablet (600 mg total) by mouth 2 (two) times daily. Take during the day to help cough out sputum. 06/05/24 06/05/25  Arellano Zameza, Priscila, MD  hydrocortisone  1 % lotion Apply 1 Application topically 2 (two) times daily as needed for itching. 04/26/24   Fernand Prost, MD  hydrOXYzine  (ATARAX ) 50 MG tablet Take 1 tablet (50 mg total) by mouth 3 (three) times daily as needed. 12/08/23   Fernand Prost, MD  latanoprost  (XALATAN ) 0.005 % ophthalmic solution Place 1 drop into both eyes at bedtime.    [provider]  latanoprost  (XALATAN ) 0.005 % ophthalmic solution Place 1 drop into both eyes daily. 12/19/23  Multiple Vitamins-Minerals (WOMENS 50+ MULTI VITAMIN/MIN) TABS Take 1 tablet by mouth daily. 03/18/14   Rosan Dayton BROCKS, DO  olmesartan  (BENICAR ) 5 MG tablet Take 1/2 tablet (2.5 mg total) by mouth daily. 05/27/24   Zheng, Gizzelle Lacomb, DO  Olopatadine  HCl (PATADAY ) 0.7 % SOLN Place 1 drop into both eyes daily. 05/06/24     Semaglutide , 2 MG/DOSE, (OZEMPIC , 2 MG/DOSE,) 8 MG/3ML SOPN Inject 2 mg into the skin once a week. 02/13/24   Jolaine Pac, DO    Allergies: Gabapentin     Review of Systems  Updated Vital Signs BP (!) 156/80 (BP Location: Right Arm)   Pulse 87    Temp 98.4 F (36.9 C) (Oral)   Resp 17   SpO2 96%   Physical Exam Vitals and nursing note reviewed.  HENT:     Head: Normocephalic and atraumatic.  Eyes:     Pupils: Pupils are equal, round, and reactive to light.  Cardiovascular:     Rate and Rhythm: Normal rate and regular rhythm.  Pulmonary:     Effort: Pulmonary effort is normal.     Breath sounds: Normal breath sounds.  Abdominal:     Palpations: Abdomen is soft.     Tenderness: There is abdominal tenderness.     Comments: Periumbilical tenderness without rebound or guarding  Skin:    General: Skin is warm and dry.  Neurological:     Mental Status: She is alert.  Psychiatric:        Mood and Affect: Mood normal.     (all labs ordered are listed, but only abnormal results are displayed) Labs Reviewed  RESP PANEL BY RT-PCR (RSV, FLU A&B, COVID)  RVPGX2 - Abnormal; Notable for the following components:      Result Value   SARS Coronavirus 2 by RT PCR POSITIVE (*)    All other components within normal limits  COMPREHENSIVE METABOLIC PANEL WITH GFR - Abnormal; Notable for the following components:   Glucose, Bld 110 (*)    BUN 35 (*)    Creatinine, Ser 1.79 (*)    Calcium  8.8 (*)    GFR, Estimated 30 (*)    All other components within normal limits  CBC WITH DIFFERENTIAL/PLATELET - Abnormal; Notable for the following components:   RBC 3.65 (*)    Hemoglobin 10.9 (*)    HCT 34.0 (*)    All other components within normal limits  URINALYSIS, ROUTINE W REFLEX MICROSCOPIC - Abnormal; Notable for the following components:   APPearance HAZY (*)    Glucose, UA >=500 (*)    Ketones, ur 5 (*)    Protein, ur 30 (*)    Leukocytes,Ua TRACE (*)    Bacteria, UA RARE (*)    All other components within normal limits  CBG MONITORING, ED - Abnormal; Notable for the following components:   Glucose-Capillary 104 (*)    All other components within normal limits  TROPONIN I (HIGH SENSITIVITY)    EKG: EKG  Interpretation Date/Time:  Friday July 12 2024 14:27:55 EDT Ventricular Rate:  91 PR Interval:  131 QRS Duration:  77 QT Interval:  354 QTC Calculation: 436 R Axis:   80  Text Interpretation: Sinus rhythm Confirmed by Pamella Sharper [phone]) on 07/12/2024 7:25:32 PM  Radiology: CT ABDOMEN PELVIS WO CONTRAST Result Date: 07/12/2024 CLINICAL DATA:  Abdominal pain and nausea. EXAM: CT ABDOMEN AND PELVIS WITHOUT CONTRAST TECHNIQUE: Multidetector CT imaging of the abdomen and pelvis was performed following the standard protocol without IV contrast. RADIATION  DOSE REDUCTION: This exam was performed according to the departmental dose-optimization program which includes automated exposure control, adjustment of the mA and/or kV according to patient size and/or use of iterative reconstruction technique. COMPARISON:  CT dated 09/09/2019. FINDINGS: Evaluation of this exam is limited in the absence of intravenous contrast. Lower chest: Trace right pleural effusion may be present. The visualized lung bases are otherwise clear. No intra-abdominal free air or free fluid. Hepatobiliary: The liver is unremarkable. No biliary dilatation. Layering stone in the gallbladder. No pericholecystic fluid or evidence of acute cholecystitis by CT. Pancreas: Unremarkable. No pancreatic ductal dilatation or surrounding inflammatory changes. Spleen: Normal in size without focal abnormality. Adrenals/Urinary Tract: The adrenal glands unremarkable. There is no hydronephrosis or nephrolithiasis on either side. The visualized ureters and urinary bladder appear unremarkable. Stomach/Bowel: There is no bowel obstruction or active inflammation. The appendix is normal. Vascular/Lymphatic: Mild aortoiliac atherosclerotic disease. The IVC is unremarkable. No portal venous gas. There is no adenopathy. Reproductive: The uterus is grossly unremarkable. No suspicious adnexal masses. Other: Midline vertical anterior pelvic wall incisional scar.  Musculoskeletal: Osteopenia with degenerative changes. No acute osseous pathology. IMPRESSION: 1. No acute intra-abdominal or pelvic pathology. No hydronephrosis or nephrolithiasis. 2. Cholelithiasis. 3.  Aortic Atherosclerosis (ICD10-I70.0). Electronically Signed   By: Vanetta Chou M.D.   On: 07/12/2024 17:48   DG Chest 2 View Result Date: 07/12/2024 CLINICAL DATA:  Fatigue. EXAM: CHEST - 2 VIEW COMPARISON:  September 14, 2019. FINDINGS: The heart size and mediastinal contours are within normal limits. Both lungs are clear. The visualized skeletal structures are unremarkable. IMPRESSION: No active cardiopulmonary disease. Electronically Signed   By: Lynwood Landy Raddle M.D.   On: 07/12/2024 15:31     Procedures   Medications Ordered in the ED  lactated ringers  bolus 1,000 mL (1,000 mLs Intravenous New Bag/Given 07/12/24 1451)    Clinical Course as of 07/12/24 1930  Fri Jul 12, 2024  1542 Renal function at baseline.  No leukocytosis.  No significant change in baseline anemia.  Troponin flat.  Not consistent with ACS.  Chest x-ray no active disease.  COVID test is positive which likely represents the cause of her symptoms. [MP]  1928 CT abdomen pelvis shows no acute abnormalities.  Instructed patient on symptomatic management of symptoms of acute COVID at home.  She will be discharged with instruction for PCP follow-up  [MP]    Clinical Course User Index [MP] Pamella Ozell LABOR, DO                                 Medical Decision Making 73 year old female with history above presents concern for hypotension and general malaise abdominal pain and nausea.  Hypotensive on arrival.  Afebrile.  Normotensive after 1 L IV fluids.  Now hypertensive upon my initial assessment.  Some mild abdominal tenderness in the periumbilical region.  No adventitious lung sounds.  Presentation most concerning for acute infectious process.  Potential sources would be pneumonia, intra-abdominal infection urinary tract.   Will obtain laboratory workup chest x-ray CT abdomen pelvis continue to monitor hemodynamic status closely and  Amount and/or Complexity of Data Reviewed Radiology: ordered.        Final diagnoses:  COVID-19    ED Discharge Orders     None          Pamella Ozell LABOR, DO 07/12/24 1930

## 2024-07-12 NOTE — ED Triage Notes (Addendum)
 Pt has c/o mild headache, body aches, weakness for 3 days, this AM, noticed her BP was low.

## 2024-07-12 NOTE — Discharge Instructions (Signed)
 You were seen in the emergency room for weakness and fatigue You tested positive for COVID This is likely the cause of your symptoms You can take Tylenol  or Motrin  as directed for aches and pains and fevers Keep well-hydrated Follow-up with your primary care doctor 1 week for reevaluation Return to the emergency department for chest pain trouble breathing or any other concerns

## 2024-07-16 ENCOUNTER — Other Ambulatory Visit: Payer: Self-pay

## 2024-07-16 ENCOUNTER — Ambulatory Visit: Admitting: Student

## 2024-07-16 VITALS — BP 156/84 | HR 93 | Ht 61.5 in | Wt 133.0 lb

## 2024-07-16 DIAGNOSIS — E785 Hyperlipidemia, unspecified: Secondary | ICD-10-CM

## 2024-07-16 DIAGNOSIS — E118 Type 2 diabetes mellitus with unspecified complications: Secondary | ICD-10-CM

## 2024-07-16 DIAGNOSIS — I10 Essential (primary) hypertension: Secondary | ICD-10-CM

## 2024-07-16 DIAGNOSIS — Z7985 Long-term (current) use of injectable non-insulin antidiabetic drugs: Secondary | ICD-10-CM

## 2024-07-16 DIAGNOSIS — Z7984 Long term (current) use of oral hypoglycemic drugs: Secondary | ICD-10-CM

## 2024-07-16 DIAGNOSIS — E1169 Type 2 diabetes mellitus with other specified complication: Secondary | ICD-10-CM | POA: Diagnosis not present

## 2024-07-16 LAB — POCT GLYCOSYLATED HEMOGLOBIN (HGB A1C): Hemoglobin A1C: 7.6 % — AB (ref 4.0–5.6)

## 2024-07-16 LAB — GLUCOSE, CAPILLARY: Glucose-Capillary: 93 mg/dL (ref 70–99)

## 2024-07-16 NOTE — Assessment & Plan Note (Addendum)
 Chronic recurrent history of hypotension with antihypertensive use. Blood pressure today is elevated. She is currently tolerating olmesartan  2.5 mg; however, dose titration is being held due to prior episodes of hypotension with the 5 mg dose. Additionally, she is still recuperating from a recent viral illness. The patient has previously trialed amlodipine  but was unable to tolerate it due to gastrointestinal side effects. Thiazide diuretics are avoided due to a history of orthostatic hypotension on chlorthalidone . ACE inhibitors are contraindicated given a prior history of dry cough on lisinopril . Plan: - Continue olmesartan  2.5 mg.

## 2024-07-16 NOTE — Progress Notes (Signed)
 CC: Hospital follow up   HPI:  [name] is a 73 y.o. female living with a history stated below and presents today for follow up on diabetes and hypertension..   Notably, the patient was seen in the ED on 07/12/2024 for hypotension and general malaise, and was found to be COVID-positive. Since discharge, she reports eating and drinking without difficulty. She denies fever, shortness of breath, or chest pain.  Please see problem based assessment and plan for additional details.  Past Medical History:  Diagnosis Date   Allergy    SEASONAL   Arthritis    Bacterial sinusitis 12/09/2023   Cervical radiculopathy due to degenerative joint disease of spine 05/03/2017   CKD (chronic kidney disease) stage 3, GFR 30-59 ml/min (HCC) 01/01/2021   Diabetes mellitus 1996   Diabetic neuropathy (HCC) 05/20/2013   Glaucoma    Grief 08/07/2023   H. pylori infection    Hyperlipidemia    Hypertension    Normocytic anemia 02/14/2013   Osteopenia 05/04/2016   Pain in both lower extremities 02/14/2024   Pain of both shoulder joints 11/14/2016   Pruritus 12/09/2023    Current Outpatient Medications on File Prior to Visit  Medication Sig Dispense Refill   acetaminophen  (TYLENOL ) 325 MG tablet Take 2 tablets (650 mg total) by mouth every 6 (six) hours as needed (or Fever >/= 101). 60 tablet 0   atorvastatin  (LIPITOR) 40 MG tablet Take 1 tablet (40 mg total) by mouth daily. 90 tablet 2   benzonatate  (TESSALON  PERLES) 100 MG capsule Take 1 capsule (100 mg total) by mouth every 6 (six) hours as needed for cough. Take around bedtime/at night to help suppress cough. 20 capsule 1   blood glucose meter kit and supplies KIT Dispense based on patient and insurance preference. Use up to four times daily as directed. . 1 each 0   Continuous Glucose Receiver (DEXCOM G7 RECEIVER) DEVI Use to check blood glucose before upon waking up, meals, at bedtime, and as needed, 1 each 0   Continuous Glucose Sensor  (DEXCOM G7 SENSOR) MISC Apply sensor to back of arm to continuously monitor blood glucose, changing the sensor every 10 days as directed. 9 each 0   diclofenac  Sodium (VOLTAREN ) 1 % GEL Apply 2 g topically daily as needed (hand pain). 100 g 2   empagliflozin  (JARDIANCE ) 25 MG TABS tablet Take 1 tablet (25 mg total) by mouth daily before breakfast. 90 tablet 3   fluticasone  (FLONASE ) 50 MCG/ACT nasal spray Place 1 spray into both nostrils daily. 16 g 2   glucose 4 GM chewable tablet Chew 1 tablet (4 g total) by mouth as needed for low blood sugar. 50 tablet 12   glucose blood (ACCU-CHEK GUIDE) test strip You do not medically need to check blood sugar while not taking insulin . You can check your blood sugar if you are concerned when you want, but do not need to. 100 each 12   guaiFENesin  (MUCINEX ) 600 MG 12 hr tablet Take 1 tablet (600 mg total) by mouth 2 (two) times daily. Take during the day to help cough out sputum. 30 tablet 0   hydrocortisone  1 % lotion Apply 1 Application topically 2 (two) times daily as needed for itching.     hydrOXYzine  (ATARAX ) 50 MG tablet Take 1 tablet (50 mg total) by mouth 3 (three) times daily as needed. 30 tablet 0   latanoprost  (XALATAN ) 0.005 % ophthalmic solution Place 1 drop into both eyes at bedtime.  latanoprost  (XALATAN ) 0.005 % ophthalmic solution Place 1 drop into both eyes daily. 2.5 mL 11   Multiple Vitamins-Minerals (WOMENS 50+ MULTI VITAMIN/MIN) TABS Take 1 tablet by mouth daily. 30 tablet    olmesartan  (BENICAR ) 5 MG tablet Take 1/2 tablet (2.5 mg total) by mouth daily. 45 tablet 3   Olopatadine  HCl (PATADAY ) 0.7 % SOLN Place 1 drop into both eyes daily. 5 mL 2   Semaglutide , 2 MG/DOSE, (OZEMPIC , 2 MG/DOSE,) 8 MG/3ML SOPN Inject 2 mg into the skin once a week. 3 mL 6   No current facility-administered medications on file prior to visit.    Review of Systems: ROS negative except for what is noted on the assessment and plan.  Vitals:   07/16/24  1453 07/16/24 1457  BP: (!) 158/80 (!) 156/84  Pulse: 93 93  TempSrc: Oral   SpO2: 93%   Weight: 133 lb (60.3 kg)   Height: 5' 1.5 (1.562 m)        Physical Exam: Constitutional: Not in acute distress. Cardiovascular: RRR, no murmurs. Pulmonary/Chest: Clear bilateral lungs Abdominal: soft, non-tender, non-distended.  Assessment & Plan:   Patient discussed with Dr.  Francesco Assessment & Plan Type II diabetes mellitus with complication (HCC) A1c has increased from 7.0% to 7.5%. Her current outpatient regimen includes Ozempic  2 mg weekly and Jardiance  25 mg daily. At her last office visit, Tresiba  was discontinued, as she was on a low dose and her A1c was at goal at that time. Blood glucose data from her Dexcom device was initially unavailable; [name] assisted in resolving the issue, though this resulted in the loss of prior glucose readings. Plan: - Follow up in 3 months for A1c check. - If A1c continues to trend upward, consider restarting Tresiba  at 6 units daily. - Continue current regimen of Ozempic  2 mg weekly and Jardiance  25 mg daily.  Essential hypertension Chronic recurrent history of hypotension with antihypertensive use. Blood pressure today is elevated. She is currently tolerating olmesartan  2.5 mg; however, dose titration is being held due to prior episodes of hypotension with the 5 mg dose. Additionally, she is still recuperating from a recent viral illness. The patient has previously trialed amlodipine  but was unable to tolerate it due to gastrointestinal side effects. Thiazide diuretics are avoided due to a history of orthostatic hypotension on chlorthalidone . ACE inhibitors are contraindicated given a prior history of dry cough on lisinopril . Plan: - Continue olmesartan  2.5 mg. Hyperlipidemia associated with type 2 diabetes mellitus (HCC) On atorvastatin  40 mg, last LDL was checked 6 months ago slightly above baseline. -Encourage healthy diet and  exercise.   Orders Placed This Encounter  Procedures   Glucose, capillary   POC Hbg A1C    Missy Sandhoff, MD [facility] Internal Medicine, PGY-2  Date 07/16/2024 Time 8:02 PM

## 2024-07-16 NOTE — Assessment & Plan Note (Addendum)
 A1c has increased from 7.0% to 7.5%. Her current outpatient regimen includes Ozempic  2 mg weekly and Jardiance  25 mg daily. At her last office visit, Tresiba  was discontinued, as she was on a low dose and her A1c was at goal at that time. Blood glucose data from her Dexcom device was initially unavailable; Amanda [name] assisted in resolving the issue, though this resulted in the loss of prior glucose readings. Plan: - Follow up in 3 months for A1c check. - If A1c continues to trend upward, consider restarting Tresiba  at 6 units daily. - Continue current regimen of Ozempic  2 mg weekly and Jardiance  25 mg daily.

## 2024-07-16 NOTE — Patient Instructions (Addendum)
 It was a pleasure taking care of you today!    For your blood pressure, continue to take 2.5 mg of olmerstan daily. (1/2 tablet)  2.  Diabetes, continue Ozempic  2 mg, and Jardiance  25 mg.   I have ordered the following labs for you:   Lab Orders         Glucose, capillary         POC Hbg A1C       Follow up: 3 months   Should you have any questions or concerns please call the internal medicine clinic at [phone].     Missy Sandhoff, MD  [facility]

## 2024-07-16 NOTE — Assessment & Plan Note (Signed)
 On atorvastatin  40 mg, last LDL was checked 6 months ago slightly above baseline. -Encourage healthy diet and exercise.

## 2024-07-18 NOTE — Progress Notes (Signed)
 Internal Medicine Clinic Attending  Case discussed with the resident at the time of the visit.  We reviewed the resident's history and exam and pertinent patient test results.  I agree with the assessment, diagnosis, and plan of care documented in the resident's note.

## 2024-07-31 ENCOUNTER — Other Ambulatory Visit (HOSPITAL_COMMUNITY): Payer: Self-pay

## 2024-07-31 NOTE — Progress Notes (Signed)
 07/29/24   CHIEF COMPLAINT Patient presents for  Chief Complaint  Patient presents with  . Glaucoma      HISTORY OF PRESENT ILLNESS: Amanda [name] is a 73 y.o. female who present to the clinic today for:  HPI     Glaucoma   The patient reports they are tolerating drops well.        Comments   73 y.o female patient here for VA, IOP, OCT RNFL Pt has Hx of: 1. Type 2 diabetes mellitus with both eyes affected by proliferative retinopathy and macular edema, with long-term current use of insulin   2. Vitreous hemorrhage of left eye  3. Bilateral ocular hypertension  4. Pseudophakia of both eyes 5. s/p YAG cap OD by Dr. Caresse    Current Ocular Meds: Latan at bedtime both eyes. Missed eye drops     Requests refraction today for new spec rx           Last edited by Inocente FORBES Pao, OD on 07/29/2024  3:19 PM.      HISTORICAL INFORMATION:  CURRENT MEDICATIONS: Medications Ordered Prior to Encounter[1]  Referring physician: Referral Self No address on file  REVIEW OF SYSTEMS ROS   Positive for: Eyes Last edited by Marny CHRISTELLA Eartha Jones, COA on 07/29/2024  2:47 PM.      ALLERGIES Allergies[2]  PAST MEDICAL HISTORY Medical History[3]  PAST SURGICAL HISTORY Surgical History[4]  FAMILY HISTORY Family History[5]  SOCIAL HISTORY Social History[6]  OPHTHALMIC EXAM Base Eye Exam     Visual Acuity (Snellen - Linear)       Right Left   Dist cc 20/250 20/25 -1   Dist ph cc NI     Correction: Glasses         Tonometry (Applanation: Fluress OU, 3:18 PM)       Right Left   Pressure 26 29         Pupils       Shape React APD   Right Irregular Minimal None   Left Irregular Minimal None         Neuro/Psych     Oriented x3: Yes   Mood/Affect: Normal           Slit Lamp and Fundus Exam     External Exam       Right Left   External Normal Normal         Slit Lamp Exam       Right Left   Lids/Lashes Normal Normal    Conjunctiva/Sclera White and quiet White and quiet   Cornea Clear Clear   Anterior Chamber Deep and quiet, no cell/flare  Deep and quiet, no cell/flare    Iris Round and reactive Round and reactive   Lens PCIOL PCIOL             Refraction     Wearing Rx       Sphere Cylinder Axis Add   Right -0.25 +0.50 004 +2.75   Left +0.25 +0.75 165 +2.75    Age: 71yrs   Type: PAL         Wearing Rx #2       Sphere Cylinder Axis Add   Right -0.25 +0.50 004 +2.75   Left +0.25 +0.75 165 +2.75         Manifest Refraction       Sphere Cylinder Axis Dist VA   Right -0.25 +0.50 004 NI   Left +0.25 +0.75 165 20/20-  Final Rx       Sphere Cylinder Axis Dist VA Add   Right -0.25 +0.50 004 NI +2.75   Left +0.25 +0.75 165 20/20- +2.75    Expiration Date: 07/29/2025   Comments: Polycarbonate             IMAGING AND PROCEDURES  Imaging and Procedures for 07/31/2024:  OCT RNFL - OU - Both Eyes       OCT RNFL Interpretation  Test Date: 07/29/2024.   Right Eye  Patient Cooperation: good.  Technically adequate study.  Findings: Temporally thinning, Superiorly thinning.  Progression: There was no previous test for comparison.   Left Eye  Patient Cooperation: good.  Technically adequate study.  Findings: Inferiorly thinning, Superiorly thinning.  Progression: There was no previous test for comparison.       Prior Imaging and Procedures:    ASSESSMENT/PLAN:  1. Primary open angle glaucoma (POAG) of both eyes, indeterminate stage (Primary)   2. Bilateral ocular hypertension  Oct today reviewed  Iop elevated today Did not take latanoprost  last night Advised importance of compliance with drops and using as prescribed Continue Latanoprost  x 1 both eyes nightly  Recheck IOP 4-6 weeks, if still high will need to add/adjust treatment   - OCT RNFL - OU - Both Eyes  Ophthalmic Meds Ordered this visit: No orders of the defined types were placed  in this encounter.     Return for 4-6 week IOP check with Hensel .  There are no Patient Instructions on file for this visit.   Explained the diagnoses, plan, and follow up with the patient and they expressed understanding.  Patient expressed understanding of the importance of proper follow up care.    Abbreviations: M myopia (nearsighted); A astigmatism; H hyperopia (farsighted); P presbyopia; Mrx spectacle prescription;  CTL contact lenses; OD right eye; OS left eye; OU both eyes  XT exotropia; ET esotropia; PEK punctate epithelial keratitis; PEE punctate epithelial erosions; DES dry eye syndrome; MGD meibomian gland dysfunction; ATs artificial tears; PFAT's preservative free artificial tears; NSC nuclear sclerotic cataract; PSC posterior subcapsular cataract; ERM epi-retinal membrane; PVD posterior vitreous detachment; RD retinal detachment; DM diabetes mellitus; DR diabetic retinopathy; NPDR non-proliferative diabetic retinopathy; PDR proliferative diabetic retinopathy; CSME clinically significant macular edema; DME diabetic macular edema; dbh dot blot hemorrhages; CWS cotton wool spot; POAG primary open angle glaucoma; C/D cup-to-disc ratio; HVF humphrey visual field; GVF goldmann visual field; OCT optical coherence tomography; IOP intraocular pressure; BRVO Branch retinal vein occlusion; CRVO central retinal vein occlusion; CRAO central retinal artery occlusion; BRAO branch retinal artery occlusion; RT retinal tear; SB scleral buckle; PPV pars plana vitrectomy; VH Vitreous hemorrhage; PRP panretinal laser photocoagulation; IVK intravitreal kenalog; VMT vitreomacular traction; MH Macular hole;  NVD neovascularization of the disc; NVE neovascularization elsewhere; AREDS age related eye disease study; ARMD age related macular degeneration; POAG primary open angle glaucoma; EBMD epithelial/anterior basement membrane dystrophy; ACIOL anterior chamber intraocular lens; IOL intraocular lens; PCIOL  posterior chamber intraocular lens; Phaco/IOL phacoemulsification with intraocular lens placement; PRK photorefractive keratectomy; LASIK laser assisted in situ keratomileusis; HTN hypertension; DM diabetes mellitus; COPD chronic obstructive pulmonary disease      [1] Current Outpatient Medications on File Prior to Visit  Medication Sig Dispense Refill  . latanoprost  (XALATAN ) 0.005 % ophthalmic solution Place 1 drop into both eyes daily. 2.5 mL 11  . acetaminophen  (TYLENOL ) 325 mg tablet Take 325 mg by mouth as needed.    . amoxicillin  (AMOXIL ) 500 mg capsule Take  500 mg by mouth 3 (three) times a day.    . aspirin  81 mg EC tablet Take 81 mg by mouth Once Daily.    . atorvastatin  (LIPITOR) 10 mg tablet Take 10 mg by mouth Once Daily.  0  . cholecalciferol (VITAMIN D3) 2,000 unit cap capsule Take 1 tablet by mouth Once Daily.    . diclofenac  sodium (Voltaren ) 1 % gel Apply 1 Application topically 2 (two) times a day.    . fexofenadine  (ALLEGRA ) 180 mg tablet Take 180 mg by mouth Once Daily.    . fluticasone  propionate (FLONASE ) 50 mcg/spray nasal spray 2 sprays as needed.    . hydroCHLOROthiazide  (HYDRODIURIL ) 12.5 mg tablet Take 12.5 mg by mouth Once Daily.    . loratadine  (Claritin  Liqui-Gel) 10 mg cap capsule Take 10 mg by mouth Once Daily.    . metFORMIN  (GLUMETZA ) 500 mg 24 hr tablet Take 1,000 mg by mouth daily with breakfast.    . multivit-min-ferrous fumarate (Complete Multivitamin-Mineral) 9 mg iron /15 mL liqd Take 1 tablet by mouth Once Daily.    . naproxen  (NAPROSYN ) 500 mg tablet Take 500 mg by mouth 2 (two) times a day.  1  . olopatadine  (PAZEO) 0.7 % ophthalmic solution Administer 1 drop into both eyes daily. 5 mL 2  . senna 8.6 mg tablet Take 1 tablet by mouth daily as needed.    . simethicone  (MYLICON) 80 mg chewable tablet Take 80 mg by mouth every 6 (six) hours as needed.     No current facility-administered medications on file prior to visit.  [2] Allergies Allergen  Reactions  . Gabapentin  Psychosis    Pt states medication made her feel out of it.    [3] Past Medical History: Diagnosis Date  . Arthritis   . Diabetes mellitus    (CMD)    LBS 144 AM AICHG 6.5  . Diabetic retinopathy    (CMD)   . Hypertension   [4] Past Surgical History: Procedure Laterality Date  . CATARACT EXTRACTION Bilateral 2015   Procedure: CATARACT EXTRACTION; Dr. Sylvie  . HYSTERECTOMY      Procedure: HYSTERECTOMY  . PARS PLANA VITRECTOMY Left 05/13/2016   Procedure: PARS PLANA VITRECTOMY 25G W/ LASER;  Surgeon: Paticia Allene Fairly, MD;  Location: [facility] OUTPATIENT OR;  Service: Ophthalmology;  Laterality: Left;  . SHOULDER SURGERY     Procedure: SHOULDER SURGERY  [5] Family History Problem Relation Name Age of Onset  . Diabetes Mother    . Glaucoma Father    . Cancer Sister    . Diabetes Sister    . Hypertension Sister    . Stroke Sister    . Cancer Brother    . Hypertension Brother    [6] Social History Tobacco Use  . Smoking status: Never  . Smokeless tobacco: Never  Substance Use Topics  . Alcohol use: No  . Drug use: No

## 2024-08-07 ENCOUNTER — Ambulatory Visit: Payer: Self-pay

## 2024-08-07 ENCOUNTER — Ambulatory Visit (INDEPENDENT_AMBULATORY_CARE_PROVIDER_SITE_OTHER): Admitting: Student

## 2024-08-07 DIAGNOSIS — Z7985 Long-term (current) use of injectable non-insulin antidiabetic drugs: Secondary | ICD-10-CM

## 2024-08-07 DIAGNOSIS — I10 Essential (primary) hypertension: Secondary | ICD-10-CM

## 2024-08-07 DIAGNOSIS — E118 Type 2 diabetes mellitus with unspecified complications: Secondary | ICD-10-CM

## 2024-08-07 NOTE — Telephone Encounter (Signed)
 Duplicate encounter. Reported to error log.

## 2024-08-07 NOTE — Progress Notes (Deleted)
 CC: Acute visit for hypotension, and generalized fatigue.  HPI:  [name] is a 73 y.o. female living with a history stated below and presents today for hypotension and generalized fatigue.   Dizziness feeling weak.  Ask about sick contacts, shortness of breath or fevers.   Please see problem based assessment and plan for additional details.  Past Medical History:  Diagnosis Date   Allergy    SEASONAL   Arthritis    Bacterial sinusitis 12/09/2023   Cervical radiculopathy due to degenerative joint disease of spine 05/03/2017   CKD (chronic kidney disease) stage 3, GFR 30-59 ml/min (HCC) 01/01/2021   Diabetes mellitus 1996   Diabetic neuropathy (HCC) 05/20/2013   Glaucoma    Grief 08/07/2023   H. pylori infection    Hyperlipidemia    Hypertension    Normocytic anemia 02/14/2013   Osteopenia 05/04/2016   Pain in both lower extremities 02/14/2024   Pain of both shoulder joints 11/14/2016   Pruritus 12/09/2023    Current Outpatient Medications on File Prior to Visit  Medication Sig Dispense Refill   acetaminophen  (TYLENOL ) 325 MG tablet Take 2 tablets (650 mg total) by mouth every 6 (six) hours as needed (or Fever >/= 101). 60 tablet 0   atorvastatin  (LIPITOR) 40 MG tablet Take 1 tablet (40 mg total) by mouth daily. 90 tablet 2   benzonatate  (TESSALON  PERLES) 100 MG capsule Take 1 capsule (100 mg total) by mouth every 6 (six) hours as needed for cough. Take around bedtime/at night to help suppress cough. 20 capsule 1   blood glucose meter kit and supplies KIT Dispense based on patient and insurance preference. Use up to four times daily as directed. . 1 each 0   Continuous Glucose Receiver (DEXCOM G7 RECEIVER) DEVI Use to check blood glucose before upon waking up, meals, at bedtime, and as needed, 1 each 0   Continuous Glucose Sensor (DEXCOM G7 SENSOR) MISC Apply sensor to back of arm to continuously monitor blood glucose, changing the sensor every 10 days as  directed. 9 each 0   diclofenac  Sodium (VOLTAREN ) 1 % GEL Apply 2 g topically daily as needed (hand pain). 100 g 2   empagliflozin  (JARDIANCE ) 25 MG TABS tablet Take 1 tablet (25 mg total) by mouth daily before breakfast. 90 tablet 3   fluticasone  (FLONASE ) 50 MCG/ACT nasal spray Place 1 spray into both nostrils daily. 16 g 2   glucose 4 GM chewable tablet Chew 1 tablet (4 g total) by mouth as needed for low blood sugar. 50 tablet 12   glucose blood (ACCU-CHEK GUIDE) test strip You do not medically need to check blood sugar while not taking insulin . You can check your blood sugar if you are concerned when you want, but do not need to. 100 each 12   guaiFENesin  (MUCINEX ) 600 MG 12 hr tablet Take 1 tablet (600 mg total) by mouth 2 (two) times daily. Take during the day to help cough out sputum. 30 tablet 0   hydrocortisone  1 % lotion Apply 1 Application topically 2 (two) times daily as needed for itching.     hydrOXYzine  (ATARAX ) 50 MG tablet Take 1 tablet (50 mg total) by mouth 3 (three) times daily as needed. 30 tablet 0   latanoprost  (XALATAN ) 0.005 % ophthalmic solution Place 1 drop into both eyes at bedtime.     latanoprost  (XALATAN ) 0.005 % ophthalmic solution Place 1 drop into both eyes daily. 2.5 mL 11   Multiple Vitamins-Minerals (WOMENS  50+ MULTI VITAMIN/MIN) TABS Take 1 tablet by mouth daily. 30 tablet    olmesartan  (BENICAR ) 5 MG tablet Take 1/2 tablet (2.5 mg total) by mouth daily. 45 tablet 3   Olopatadine  HCl (PATADAY ) 0.7 % SOLN Place 1 drop into both eyes daily. 5 mL 2   Semaglutide , 2 MG/DOSE, (OZEMPIC , 2 MG/DOSE,) 8 MG/3ML SOPN Inject 2 mg into the skin once a week. 3 mL 6   No current facility-administered medications on file prior to visit.    Review of Systems: ROS negative except for what is noted on the assessment and plan.  There were no vitals filed for this visit. {Labs (Optional):23779} {Vitals History (Optional):23777}  Physical Exam: Constitutional:  NAD Cardiovascular: RRR, no murmurs. Pulmonary/Chest: Clear bilateral lungs Abdominal: soft, non-tender, non-distended.  Assessment & Plan:   Patient {GC/GE:[id number]::discussed with,seen with} Dr. {[id number]::Tpoopjfd,Z. Hoffman,Chambliss, [name],Lau,Machen}  Assessment & Plan   No orders of the defined types were placed in this encounter.   Missy Sandhoff, MD [facility] Internal Medicine, PGY-2  Date 08/07/2024 Time 11:14 AM

## 2024-08-07 NOTE — Addendum Note (Signed)
 Addended by: CELESTINA CZAR on: 08/07/2024 03:24 PM   Modules accepted: Level of Service

## 2024-08-07 NOTE — Telephone Encounter (Signed)
 1st attempt [email or web address]  Copied from CRM #[id number]. Topic: Clinical - Pink Word Triage >> Aug 07, 2024  9:57 AM Diannia H wrote: Reason for Triage: Patient is calling in because her blood pressure is low. She feels fine minus coughing and a little sinus. She said her heart rate is good. She is wanting to know what she is needing to do. She said its low so she doesn't take the medicine and then it goes back to how it should be after about 3 days.  Patients callback number is [phone]. >> Aug 07, 2024 10:03 AM Diannia H wrote: Patient is calling in because her blood pressure is low. She feels fine minus coughing and a little sinus. She said her heart rate is good. She is wanting to know what she is needing to do. She said its low so she doesn't take the medicine and then it goes back to how it should be after about 3 days.  Patients callback number is [phone].

## 2024-08-07 NOTE — Progress Notes (Addendum)
   CC: Low blood pressures.  This is a telephone encounter between JAQUIA BENEDICTO and Careena Degraffenreid on 08/07/2024 for low blood pressure at home. The visit was conducted with the patient located at home and Missy Sandhoff at [facility]. The patient's identity was confirmed using their DOB and current address. The patient has consented to being evaluated through a telephone encounter and understands the associated risks (an examination cannot be done and the patient may need to come in for an appointment) / benefits (allows the patient to remain at home, decreasing exposure to coronavirus). I personally spent 15 minutes on medical discussion.   HPI:  Ms.Posie R Sundby is a 73 y.o. with PMH as below.   She was last seen in the resident clinic about 4 weeks ago.  At that visit her blood pressure was elevated on olmerstan 2.5mg , however titration was held due to history of hypotension and she was also getting over COVID-19.  She has been taking olmesartan  5 mg daily.  She endorses fatigue, generalized weakness.  Today her home blood pressure was the high 80s over 50.  No chest pain, dizziness or palpitation.  Her blood sugar have been normal.  She eats twice a day.  Please see A&P for assessment of the patient's acute and chronic medical conditions.   Past Medical History:  Diagnosis Date   Allergy    SEASONAL   Arthritis    Bacterial sinusitis 12/09/2023   Cervical radiculopathy due to degenerative joint disease of spine 05/03/2017   CKD (chronic kidney disease) stage 3, GFR 30-59 ml/min (HCC) 01/01/2021   Diabetes mellitus 1996   Diabetic neuropathy (HCC) 05/20/2013   Glaucoma    Grief 08/07/2023   H. pylori infection    Hyperlipidemia    Hypertension    Normocytic anemia 02/14/2013   Osteopenia 05/04/2016   Pain in both lower extremities 02/14/2024   Pain of both shoulder joints 11/14/2016   Pruritus 12/09/2023   Review of Systems: Negative as noted in HPI.    Assessment & Plan:    No problem-specific Assessment & Plan notes found for this encounter.   Hypertension Chronic history of intermittent hypotension on blood pressure medications. Patient is taking olmerstan 5 mg instead of the prescribed 2.5 mg.  We discussed today, the need  to keep a blood pressure log for the next 2 weeks and to hold  olmerstan on days when SBP is <90. Plan: - Take  tablet of olmesartan  5 mg daily, and skip dose if SBP< 90. - Follow up in 2 weeks. - Bring blood pressure machine to next office visit. - If blood pressure remains elevated, consider reducing Ozempic  dose to 1 mg.  Type 2 diabetes Last A1c was 7.5. She is tolerating Ozempic  2 mg without GI side effects and denies hypoglycemia. Blood sugars have been stable at 90-110, making Ozempic  unlikely to contribute to her hypotensive episodes. Plan: - At next office visit, consider decreasing Ozempic  to 1 mg if needed, particularly to address her ongoing generalized weakness.  Patient discussed with Dr. Karna Missy Sandhoff, MD  Internal Medicine Resident

## 2024-08-07 NOTE — Telephone Encounter (Signed)
 RTC to see what color the mucous is that patient is coughing up and if she is coughing up anything.  Also wanted to ask if she has a fever.   Message was left that the Clinics had called.

## 2024-08-07 NOTE — Telephone Encounter (Signed)
 FYI Only or Action Required?: FYI only for provider.  Patient was last seen in primary care on 07/16/2024 by Celestina Czar, MD.  Called Nurse Triage reporting Hypotension and Cough.  Symptoms began 2 weeks ago.  Interventions attempted: Rest, hydration, or home remedies.  Symptoms are: unchanged.  Triage Disposition: See HCP Within 4 Hours (Or PCP Triage)  Patient/caregiver understands and will follow disposition?: Yes  Reason for Disposition  [1] Systolic BP 90-110 AND [2] taking blood pressure medications AND [3] feeling weak or lightheaded  Answer Assessment - Initial Assessment Questions 1. BLOOD PRESSURE: What is your blood pressure? Did you take at least two measurements 5 minutes apart?     Patient reports low blood pressure readings in the morning SBP in the mid 80s-patient currently doesn't have her pressures in front of her. Patient reports bps in the mornings have been in mid 80s/50s. Patient rechecked her blood pressure on the phone with Nurse Triage-124/68 HR 88 2. ONSET: When did you take your blood pressure?     This morning 3. HOW: How did you take your blood pressure? (e.g., visiting nurse, automatic home BP monitor)     Automatic blood pressure monitor 4. HISTORY: Do you have a history of low blood pressure? What is your blood pressure normally?     Patient reports she has had issues with low blood pressure 5. MEDICINES: Are you taking any medicines for blood pressure? If Yes, ask: Have they been changed recently?     Benicar  6. PULSE RATE: Do you know what your pulse rate is?      Reports her HR this morning was above 60 7. OTHER SYMPTOMS: Have you been sick recently? Have you had a recent injury?     No  Patient reports low blood pressure readings have been going on for a couple of weeks. Patient reports she is taking benicar  5mg -patient did report holding her medication today.  Protocols used: Blood Pressure - Low-A-AH

## 2024-08-09 ENCOUNTER — Other Ambulatory Visit (HOSPITAL_COMMUNITY): Payer: Self-pay

## 2024-08-12 NOTE — Progress Notes (Signed)
 Internal Medicine Clinic Attending  Case discussed with the resident at the time of the visit.  We reviewed the resident's history and exam and pertinent patient test results.  I agree with the assessment, diagnosis, and plan of care documented in the resident's note.

## 2024-08-21 ENCOUNTER — Other Ambulatory Visit: Payer: Self-pay | Admitting: Internal Medicine

## 2024-08-21 ENCOUNTER — Other Ambulatory Visit (HOSPITAL_COMMUNITY): Payer: Self-pay

## 2024-08-21 ENCOUNTER — Ambulatory Visit

## 2024-08-21 VITALS — BP 152/81 | HR 74 | Temp 97.9°F | Ht 61.5 in | Wt 135.0 lb

## 2024-08-21 DIAGNOSIS — E118 Type 2 diabetes mellitus with unspecified complications: Secondary | ICD-10-CM | POA: Diagnosis not present

## 2024-08-21 DIAGNOSIS — Z23 Encounter for immunization: Secondary | ICD-10-CM

## 2024-08-21 DIAGNOSIS — I739 Peripheral vascular disease, unspecified: Secondary | ICD-10-CM | POA: Diagnosis not present

## 2024-08-21 DIAGNOSIS — I10 Essential (primary) hypertension: Secondary | ICD-10-CM

## 2024-08-21 DIAGNOSIS — E1142 Type 2 diabetes mellitus with diabetic polyneuropathy: Secondary | ICD-10-CM

## 2024-08-21 DIAGNOSIS — Z299 Encounter for prophylactic measures, unspecified: Secondary | ICD-10-CM | POA: Diagnosis not present

## 2024-08-21 DIAGNOSIS — Z7984 Long term (current) use of oral hypoglycemic drugs: Secondary | ICD-10-CM

## 2024-08-21 DIAGNOSIS — Z7985 Long-term (current) use of injectable non-insulin antidiabetic drugs: Secondary | ICD-10-CM

## 2024-08-21 MED ORDER — DEXCOM G7 SENSOR MISC
0 refills | Status: DC
  Filled 2024-08-21 – 2024-09-13 (×3): qty 9, 90d supply, fill #0

## 2024-08-21 NOTE — Telephone Encounter (Signed)
 Medication sent to pharmacy

## 2024-08-21 NOTE — Assessment & Plan Note (Addendum)
 Last A1c on 7/29 was 7.6%. Currently on jardiance  25 mg daily and ozempic  2 mg weekly. Discontinued degludec in the beginning of June.  Plan to recheck BMP and A1c at the end of October for DM and CKD3b. If A1c continues to rise, consider adding back insulin  6 units daily.

## 2024-08-21 NOTE — Assessment & Plan Note (Addendum)
 Patient last seen on 8/20 and told to check Bps at home, and take 2.5 mg olmesartan  when SBP >90. Today, patient is unable to consistently say how she's been taking the medicine. She did check her Bps several times a day with two different cuffs. She brought in her recordings and her cuffs today. Of note, we checked her BP with her at home cuff and it did match the reading from our [facility] cuff. She thinks she took the medicine about 6-7 times over the last two weeks. Many of her recordings are lower, 80s/60s, mostly 110s/90s, only a couple elevated readings. When she has low Bps, patient denies brain fog, dizziness, weakness, or persistent nausea. States she sometimes has to wait a few seconds when she first stands up before moving, but otherwise she has felt normal. Discussed importance of eating small meals throughout the day and making sure to stay hydrated and the importance of eating enough salt without overeating salt.  Plan to have patient stop the olmesartan  all together for the next two weeks to see if she even needs a BP medicine. Instructed to only check her bp (using the gray cuff) in the morning before taking any other medications and then again sometime after 4 pm. We will see her back in two weeks to review her recordings. If she's consistently elevated, can add back olmesartan  2.5 mg.

## 2024-08-21 NOTE — Progress Notes (Signed)
 N     Established Patient Office Visit  Subjective   Patient ID: Amanda [name], female    DOB: 02/12/1951  Age: 73 y.o. MRN: [id number]  Patient presents for a two week follow up, last seen on 8/20, to discuss blood pressure and review home BP records.         Objective:     BP (!) 152/81 (BP Location: Left Arm, Patient Position: Sitting, Cuff Size: Normal)   Pulse 74   Temp 97.9 F (36.6 C) (Oral)   Ht 5' 1.5 (1.562 m)   Wt 135 lb (61.2 kg)   SpO2 98%   BMI 25.09 kg/m    Physical Exam Vitals reviewed.  Constitutional:      Appearance: Normal appearance.  HENT:     Nose: Nose normal.     Mouth/Throat:     Mouth: Mucous membranes are moist.     Pharynx: Oropharynx is clear.  Eyes:     Conjunctiva/sclera: Conjunctivae normal.  Cardiovascular:     Rate and Rhythm: Normal rate and regular rhythm.     Heart sounds: Normal heart sounds.  Pulmonary:     Effort: Pulmonary effort is normal.     Breath sounds: Normal breath sounds.  Skin:    General: Skin is warm and dry.  Neurological:     General: No focal deficit present.     Mental Status: She is alert and oriented to person, place, and time.  Psychiatric:        Mood and Affect: Mood normal.        Behavior: Behavior normal.      No results found for any visits on 08/21/24.    The 10-year ASCVD risk score (Arnett DK, et al., 2019) is: 31.9%    Assessment & Plan:   Assessment & Plan Essential hypertension Patient last seen on 8/20 and told to check Bps at home, and take 2.5 mg olmesartan  when SBP >90. Today, patient is unable to consistently say how she's been taking the medicine. She did check her Bps several times a day with two different cuffs. She brought in her recordings and her cuffs today. Of note, we checked her BP with her at home cuff and it did match the reading from our [facility] cuff. She thinks she took the medicine about 6-7 times over the last two weeks. Many of her recordings are lower,  80s/60s, mostly 110s/90s, only a couple elevated readings. When she has low Bps, patient denies brain fog, dizziness, weakness, or persistent nausea. States she sometimes has to wait a few seconds when she first stands up before moving, but otherwise she has felt normal. Discussed importance of eating small meals throughout the day and making sure to stay hydrated and the importance of eating enough salt without overeating salt.  Plan to have patient stop the olmesartan  all together for the next two weeks to see if she even needs a BP medicine. Instructed to only check her bp (using the gray cuff) in the morning before taking any other medications and then again sometime after 4 pm. We will see her back in two weeks to review her recordings. If she's consistently elevated, can add back olmesartan  2.5 mg.      Type II diabetes mellitus with complication (HCC) Last A1c on 7/29 was 7.6%. Currently on jardiance  25 mg daily and ozempic  2 mg weekly. Discontinued degludec in the beginning of June.  Plan to recheck BMP and A1c at the end of  October for DM and CKD3b. If A1c continues to rise, consider adding back insulin  6 units daily.     Preventive measure Given today. Orders:   Flu vaccine HIGH DOSE PF(Fluzone Trivalent)  Peripheral arterial disease (HCC) Last documented in 10/2021. Patient taking atorvastatin  40 mg daily. Recommended to also be taking aspirin  81 mg daily but patient is not currently. Plan to start aspirin  81 mg at next complete office visit.      Return in about 2 weeks (around 09/04/2024) for BP check .    Viktoria King, DO

## 2024-08-21 NOTE — Patient Instructions (Addendum)
 It was wonderful seeing you today!   Please remember to...  1) STOP taking your blood pressure medicine.   2) For your blood pressure, check your pressures on your left arm between 7 - 9 am and write it down. Sometime after 4 pm, recheck your blood pressure on your left arm and write it down.   3) We will see you back in two weeks for a short appointment to recheck your blood pressure and see what you've written down over the last two weeks.   If you have any questions please feel free to the call the clinic at anytime at [phone].  Have a blessed day,  Dr. Charmayne

## 2024-08-21 NOTE — Assessment & Plan Note (Signed)
 Last documented in 10/2021. Patient taking atorvastatin  40 mg daily. Recommended to also be taking aspirin  81 mg daily but patient is not currently. Plan to start aspirin  81 mg at next complete office visit.

## 2024-08-21 NOTE — Assessment & Plan Note (Addendum)
 Given today. Orders:   Flu vaccine HIGH DOSE PF(Fluzone Trivalent)

## 2024-08-22 ENCOUNTER — Telehealth: Payer: Self-pay

## 2024-08-22 NOTE — Telephone Encounter (Signed)
 Prior Authorization for patient (Dexcom G7 Sensor) came through on cover my meds was submitted with last office notes and labs awaiting approval or denial.  [id number]

## 2024-08-23 NOTE — Progress Notes (Signed)
 Internal Medicine Clinic Attending  Case discussed with the resident at the time of the visit.  We reviewed the resident's history and exam and pertinent patient test results.  I agree with the assessment, diagnosis, and plan of care documented in the resident's note. Priority is having some consistency with a safe regimen.  No benefit to overly controlled BP and I agree with the adjustments recommended.  She is on a good DM regimen and with appropriate adherence to meds and diet should be able to avoid insulin  (she was on a very low dose).

## 2024-08-26 ENCOUNTER — Other Ambulatory Visit (HOSPITAL_COMMUNITY): Payer: Self-pay

## 2024-08-26 NOTE — Telephone Encounter (Signed)
 Patient Name: Amanda [name] Patient DOB: 1951/01/24 Patient ID: [id number] Status of Request: Deny Medication Name: Amanda [name] PANTHER Mis Sensor GPI/NDC: [id number] Decision Notes: Continuous glucose monitor system (receiver, transmitter, and sensor) is denied for not meeting the prior authorization requirement(s). Product authorization requires the following: (1) Submission of medical records (for example: chart notes, laboratory values) or claims history documenting one of the following: (A) You are being treated with insulin . (B) You have a history of problematic hypoglycemia with documentation of recurrent level 2 hypoglycemic events [glucose less than 54mg /dl (3.0 mmol/L)] that persist despite multiple attempts to adjust medication(s) and/or modify the diabetes treatment plan. (C) You have a history of problematic hypoglycemia with documentation of a history of a level 3 hypoglycemic event [glucose less than 54mg /dl (3.0 mmol/L)] characterized by altered mental and/or physical state requiring third-party assistance for treatment of hypoglycemia. (2) The treating provider has concluded that you or your caregiver has sufficient training using the continuous glucose monitor (CGM). Reviewed by: R.Ph. **Please note: This review applies to Dexcom G6, Dexcom G7, Freestyle [place], [address] [place] [address], [address] [place] [address], [place] [address], [place] 3 and 4.

## 2024-08-27 ENCOUNTER — Other Ambulatory Visit (HOSPITAL_COMMUNITY): Payer: Self-pay

## 2024-08-28 ENCOUNTER — Ambulatory Visit

## 2024-08-28 ENCOUNTER — Telehealth: Payer: Self-pay

## 2024-08-28 VITALS — Ht 61.0 in | Wt 135.0 lb

## 2024-08-28 DIAGNOSIS — Z0000 Encounter for general adult medical examination without abnormal findings: Secondary | ICD-10-CM | POA: Diagnosis not present

## 2024-08-28 NOTE — Telephone Encounter (Signed)
 Reached out to [employer] regarding duplicate Ozempic  refill form rec'd.  Per rep, current shipment was cancelled for an unknown reason. Rep was able to reprocess shipment and it should arrive to office in 10-14 business day.

## 2024-08-28 NOTE — Patient Instructions (Signed)
 Ms. Picinich,  Thank you for taking the time for your Medicare Wellness Visit. I appreciate your continued commitment to your health goals. Please review the care plan we discussed, and feel free to reach out if I can assist you further.  Medicare recommends these wellness visits once per year to help you and your care team stay ahead of potential health issues. These visits are designed to focus on prevention, allowing your provider to concentrate on managing your acute and chronic conditions during your regular appointments.  Please note that Annual Wellness Visits do not include a physical exam. Some assessments may be limited, especially if the visit was conducted virtually. If needed, we may recommend a separate in-person follow-up with your provider.  Ongoing Care Seeing your primary care provider every 3 to 6 months helps us  monitor your health and provide consistent, personalized care.  Referrals If a referral was made during today's visit and you haven't received any updates within two weeks, please contact the referred provider directly to check on the status.  Recommended Screenings:  Health Maintenance  Topic Date Due   Zoster (Shingles) Vaccine (1 of 2) Never done   DTaP/Tdap/Td vaccine (2 - Td or Tdap) 02/20/2024   COVID-19 Vaccine (5 - 2025-26 season) 08/19/2024   Hemoglobin A1C  10/16/2024   Eye exam for diabetics  10/23/2024   Yearly kidney health urinalysis for diabetes  04/26/2025   Complete foot exam   05/28/2025   Yearly kidney function blood test for diabetes  07/12/2025   Medicare Annual Wellness Visit  08/28/2025   Mammogram  02/18/2026   Colon Cancer Screening  01/11/2029   Pneumococcal Vaccine for age over 49  Completed   Flu Shot  Completed   DEXA scan (bone density measurement)  Completed   Hepatitis C Screening  Completed   HPV Vaccine  Aged Out   Meningitis B Vaccine  Aged Out       08/28/2024   11:59 AM  Advanced Directives  Does Patient Have a  Medical Advance Directive? No  Would patient like information on creating a medical advance directive? No - Patient declined   Advance Care Planning is important because it: Ensures you receive medical care that aligns with your values, goals, and preferences. Provides guidance to your family and loved ones, reducing the emotional burden of decision-making during critical moments.  Vision: Annual vision screenings are recommended for early detection of glaucoma, cataracts, and diabetic retinopathy. These exams can also reveal signs of chronic conditions such as diabetes and high blood pressure.  Dental: Annual dental screenings help detect early signs of oral cancer, gum disease, and other conditions linked to overall health, including heart disease and diabetes.  Please see the attached documents for additional preventive care recommendations.

## 2024-08-28 NOTE — Progress Notes (Signed)
 Because this visit was a virtual/telehealth visit,  certain criteria was not obtained, such a blood pressure, CBG if applicable, and timed get up and go. Any medications not marked as taking were not mentioned during the medication reconciliation part of the visit. Any vitals not documented were not able to be obtained due to this being a telehealth visit or patient was unable to self-report a recent blood pressure reading due to a lack of equipment at home via telehealth. Vitals that have been documented are verbally provided by the patient.   Subjective:   Amanda [name] is a 73 y.o. who presents for a Medicare Wellness preventive visit.  As a reminder, Annual Wellness Visits don't include a physical exam, and some assessments may be limited, especially if this visit is performed virtually. We may recommend an in-person follow-up visit with your provider if needed.  Visit Complete: Virtual I connected with  Amanda [name] on 08/28/24 by a video and audio enabled telemedicine application and verified that I am speaking with the correct person using two identifiers.  Patient Location: Home  Provider Location: Office/Clinic  I discussed the limitations of evaluation and management by telemedicine. The patient expressed understanding and agreed to proceed.  Vital Signs: Because this visit was a virtual/telehealth visit, some criteria may be missing or patient reported. Any vitals not documented were not able to be obtained and vitals that have been documented are patient reported.  VideoDeclined- This patient declined Librarian, academic. Therefore the visit was completed with audio only.  Persons Participating in Visit: Patient.  AWV Questionnaire: No: Patient Medicare AWV questionnaire was not completed prior to this visit.  Cardiac Risk Factors include: advanced age (>27men, >31 women);diabetes mellitus;hypertension;dyslipidemia;family history of premature  cardiovascular disease     Objective:    Today's Vitals   08/28/24 1154 08/28/24 1156  Weight: 135 lb (61.2 kg)   Height: 5' 1 (1.549 m)   PainSc: 4  4   PainLoc: Foot    Body mass index is 25.51 kg/m.     08/28/2024   11:59 AM 07/16/2024    2:58 PM 05/27/2024   11:09 AM 05/23/2024    2:37 PM 01/09/2024   10:17 AM 10/09/2023   10:08 AM 08/30/2023    2:55 PM  Advanced Directives  Does Patient Have a Medical Advance Directive? No No No No No No No  Would patient like information on creating a medical advance directive? No - Patient declined No - Patient declined No - Patient declined  No - Patient declined No - Patient declined No - Patient declined    Current Medications (verified) Outpatient Encounter Medications as of 08/28/2024  Medication Sig   acetaminophen  (TYLENOL ) 325 MG tablet Take 2 tablets (650 mg total) by mouth every 6 (six) hours as needed (or Fever >/= 101).   atorvastatin  (LIPITOR) 40 MG tablet Take 1 tablet (40 mg total) by mouth daily.   benzonatate  (TESSALON  PERLES) 100 MG capsule Take 1 capsule (100 mg total) by mouth every 6 (six) hours as needed for cough. Take around bedtime/at night to help suppress cough.   blood glucose meter kit and supplies KIT Dispense based on patient and insurance preference. Use up to four times daily as directed. .   Continuous Glucose Receiver (DEXCOM G7 RECEIVER) DEVI Use to check blood glucose before upon waking up, meals, at bedtime, and as needed,   Continuous Glucose Sensor (DEXCOM G7 SENSOR) MISC Apply sensor to back of arm  to continuously monitor blood glucose, changing the sensor every 10 days as directed.   diclofenac  Sodium (VOLTAREN ) 1 % GEL Apply 2 g topically daily as needed (hand pain).   empagliflozin  (JARDIANCE ) 25 MG TABS tablet Take 1 tablet (25 mg total) by mouth daily before breakfast.   fluticasone  (FLONASE ) 50 MCG/ACT nasal spray Place 1 spray into both nostrils daily.   glucose 4 GM chewable tablet Chew 1 tablet  (4 g total) by mouth as needed for low blood sugar.   glucose blood (ACCU-CHEK GUIDE) test strip You do not medically need to check blood sugar while not taking insulin . You can check your blood sugar if you are concerned when you want, but do not need to.   guaiFENesin  (MUCINEX ) 600 MG 12 hr tablet Take 1 tablet (600 mg total) by mouth 2 (two) times daily. Take during the day to help cough out sputum.   hydrocortisone  1 % lotion Apply 1 Application topically 2 (two) times daily as needed for itching.   hydrOXYzine  (ATARAX ) 50 MG tablet Take 1 tablet (50 mg total) by mouth 3 (three) times daily as needed.   latanoprost  (XALATAN ) 0.005 % ophthalmic solution Place 1 drop into both eyes at bedtime.   latanoprost  (XALATAN ) 0.005 % ophthalmic solution Place 1 drop into both eyes daily.   Multiple Vitamins-Minerals (WOMENS 50+ MULTI VITAMIN/MIN) TABS Take 1 tablet by mouth daily.   olmesartan  (BENICAR ) 5 MG tablet Take 1/2 tablet (2.5 mg total) by mouth daily.   Olopatadine  HCl (PATADAY ) 0.7 % SOLN Place 1 drop into both eyes daily.   Semaglutide , 2 MG/DOSE, (OZEMPIC , 2 MG/DOSE,) 8 MG/3ML SOPN Inject 2 mg into the skin once a week.   No facility-administered encounter medications on file as of 08/28/2024.    Allergies (verified) Gabapentin    History: Past Medical History:  Diagnosis Date   Allergy    SEASONAL   Arthritis    Bacterial sinusitis 12/09/2023   Cervical radiculopathy due to degenerative joint disease of spine 05/03/2017   CKD (chronic kidney disease) stage 3, GFR 30-59 ml/min (HCC) 01/01/2021   Diabetes mellitus 1996   Diabetic neuropathy (HCC) 05/20/2013   Glaucoma    Grief 08/07/2023   H. pylori infection    Hyperlipidemia    Hypertension    Normocytic anemia 02/14/2013   Osteopenia 05/04/2016   Pain in both lower extremities 02/14/2024   Pain of both shoulder joints 11/14/2016   Pruritus 12/09/2023   Past Surgical History:  Procedure Laterality Date   ABDOMINAL  HYSTERECTOMY     10 years ago    BREAST BIOPSY     BREAST LUMPECTOMY WITH RADIOACTIVE SEED LOCALIZATION Right 02/16/2022   Procedure: RIGHT BREAST LUMPECTOMY WITH RADIOACTIVE SEED LOCALIZATION;  Surgeon: Vanderbilt Ned, MD;  Location: [facility] OR;  Service: General;  Laterality: Right;   Cataracts     ROTATOR CUFF REPAIR     SHOULDER SURGERY     Rotator cuff tear    Family History  Problem Relation Age of Onset   Diabetes Mother    Hypertension Mother    Breast cancer Mother    Cancer Mother    Diabetes Sister    Hypertension Sister    Kidney disease Sister        One Kidney transplant, one sister on HD   Breast cancer Sister    Cancer Sister    Hypertension Sister    Diabetes Sister    Cerebral palsy Brother    Cancer Brother  Unknown   Hypertension Daughter    Colon cancer Neg Hx    Esophageal cancer Neg Hx    Stomach cancer Neg Hx    Rectal cancer Neg Hx    Social History   Socioeconomic History   Marital status: Widowed    Spouse name: Not on file   Number of children: 1   Years of education: Not on file   Highest education level: Not on file  Occupational History   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      The highest level of education was high school diploma.      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   Social Drivers of Corporate investment banker Strain: Low Risk  (08/28/2024)   Overall Financial  Resource Strain (CARDIA)    Difficulty of Paying Living Expenses: Not very hard  Food Insecurity: No Food Insecurity (08/28/2024)   Hunger Vital Sign    Worried About Running Out of Food in the Last Year: Never true    Ran Out of Food in the Last Year: Never true  Transportation Needs: No Transportation Needs (08/28/2024)   PRAPARE - Administrator, Civil Service (Medical): No    Lack of Transportation (Non-Medical): No  Physical Activity: Inactive (08/28/2024)   Exercise Vital Sign    Days of Exercise per Week: 0 days    Minutes of Exercise per Session: 0 min  Stress: No Stress Concern Present (08/28/2024)   [employer] of Occupational Health - Occupational Stress Questionnaire    Feeling of Stress: Not at all  Social Connections: Moderately Integrated (08/28/2024)   Social Connection and Isolation Panel    Frequency of Communication with Friends and Family: More than three times a week    Frequency of Social Gatherings with Friends and Family: More than three times a week    Attends Religious Services: More than 4 times per year    Active Member of [employer] or Organizations: Yes    Attends Banker Meetings: 1 to 4 times per year    Marital Status: Widowed    Tobacco Counseling Counseling given: Not Answered    Clinical Intake:  Pre-visit preparation completed: Yes  Pain : 0-10 Pain Score: 4  Pain Type: Neuropathic pain Pain Location: Foot Pain Orientation: Left, Right Pain Descriptors / Indicators: Tingling, Numbness Pain Onset: More than a month ago Pain Frequency: Constant     BMI - recorded: 25.51 Nutritional Status: BMI 25 -29 Overweight Nutritional Risks: None Diabetes: Yes CBG done?: No Did pt. bring in CBG monitor from home?: No  Lab Results  Component Value Date   HGBA1C 7.6 (A) 07/16/2024   HGBA1C 7.0 (A) 03/05/2024   HGBA1C 7.3 (A) 01/09/2024     How often do you need to have someone help you when you  read instructions,  pamphlets, or other written materials from your doctor or pharmacy?: 1 - Never What is the last grade level you completed in school?: HSG  Interpreter Needed?: No  Information entered by :: Roz Fuller, LPN.   Activities of Daily Living     08/28/2024   12:07 PM 05/27/2024   11:09 AM  In your present state of health, do you have any difficulty performing the following activities:  Hearing? 0 0  Vision? 0 0  Difficulty concentrating or making decisions? 0 0  Walking or climbing stairs? 0 0  Dressing or bathing? 0 0  Doing errands, shopping? 0 0  Preparing Food and eating ? N   Using the Toilet? N   In the past six months, have you accidently leaked urine? N   Do you have problems with loss of bowel control? N   Managing your Medications? N   Managing your Finances? N   Housekeeping or managing your Housekeeping? N     Patient Care Team: D'Mello, Rosalyn, DO as PCP - General (Internal Medicine) Delford Maude BROCKS, MD as PCP - Cardiology (Cardiology) Rosina Lavern SAILOR, [name] (Pharmacy Technician) Gaynel Delon CROME, DPM as Consulting Physician (Podiatry) Maree Paticia BRAVO, MD as Referring Physician (Ophthalmology) Teressa Toribio SQUIBB, MD (Inactive) as Attending Physician (Gastroenterology) Leila Bound, OD as Consulting Physician (Optometry)  I have updated your Care Teams any recent Medical Services you may have received from other providers in the past year.     Assessment:   This is a routine wellness examination for [place].  Hearing/Vision screen Hearing Screening - Comments:: Patient has adequate hearing, no hearing aids. Vision Screening - Comments:: Patient has adequate vision, wears eyeglasses.  Up to date with eye exam with Dr. Bound Pao   Goals Addressed             This Visit's Progress    08/28/24: My goal is to get back into water aerobics at the [facility] and try natural things to help have a better life.         Depression Screen     08/28/2024   12:06  PM 05/27/2024   11:09 AM 03/05/2024   10:25 AM 01/09/2024   10:35 AM 10/09/2023   10:09 AM 08/30/2023    2:56 PM 07/05/2023   11:12 AM  PHQ 2/9 Scores  PHQ - 2 Score 0 0 0 0 0 0 0  PHQ- 9 Score 0    0 0 0    Fall Risk     08/28/2024   12:10 PM 05/27/2024   11:09 AM 03/05/2024   10:25 AM 10/09/2023   10:08 AM 08/30/2023    2:55 PM  Fall Risk   Falls in the past year? 0 0 0 0 0  Number falls in past yr: 0 0 0    Injury with Fall? 0 0 0    Risk for fall due to : No Fall Risks No Fall Risks No Fall Risks No Fall Risks No Fall Risks  Follow up Falls evaluation completed Falls prevention discussed;Falls evaluation completed Falls evaluation completed Falls evaluation completed Falls evaluation completed    MEDICARE RISK AT HOME:  Medicare Risk at Home Any stairs in or around the home?: No (RAMP ON FRONT ENTRY) If so, are there any without handrails?: No Home free of loose throw rugs in walkways, pet beds, electrical cords, etc?: Yes Adequate lighting in your home to reduce risk of falls?: Yes Life alert?: No Use of  a cane, walker or w/c?: No Grab bars in the bathroom?: Yes Shower chair or bench in shower?: Yes Elevated toilet seat or a handicapped toilet?: Yes  TIMED UP AND GO:  Was the test performed?  No  Cognitive Function: 6CIT completed    08/28/2024   11:59 AM  MMSE - Mini Mental State Exam  Not completed: Unable to complete        08/28/2024   12:01 PM 07/05/2023   11:12 AM 07/01/2022    8:55 AM  6CIT Screen  What Year? 0 points 0 points 0 points  What month? 0 points 0 points 0 points  What time? 0 points 0 points 0 points  Count back from 20 0 points 0 points 0 points  Months in reverse 0 points 0 points 0 points  Repeat phrase 0 points 0 points 0 points  Total Score 0 points 0 points 0 points    Immunizations Immunization History  Administered Date(s) Administered   Fluad Quad(high Dose 65+) 09/22/2019, 09/02/2021, 09/15/2022   Fluad Trivalent(High Dose 65+)  08/30/2023   INFLUENZA, HIGH DOSE SEASONAL PF 08/21/2024   Influenza, Seasonal, Injecte, Preservative Fre 02/14/2013   Influenza,inj,Quad PF,6+ Mos 02/19/2014, 09/16/2014, 10/27/2015, 08/15/2016, 01/29/2018, 08/28/2018, 10/12/2020   Influenza-Unspecified 10/09/2019   Moderna Sars-Covid-2 Vaccination 02/27/2020, 03/30/2020   PFIZER(Purple Top)SARS-COV-2 Vaccination 03/02/2020, 03/19/2020   Pneumococcal Conjugate-13 05/07/2018   Pneumococcal Polysaccharide-23 02/14/2013, 11/11/2019   Tdap 02/19/2014    Screening Tests Health Maintenance  Topic Date Due   Zoster Vaccines- Shingrix (1 of 2) Never done   DTaP/Tdap/Td (2 - Td or Tdap) 02/20/2024   COVID-19 Vaccine (5 - 2025-26 season) 08/19/2024   HEMOGLOBIN A1C  10/16/2024   OPHTHALMOLOGY EXAM  10/23/2024   Diabetic kidney evaluation - Urine ACR  04/26/2025   FOOT EXAM  05/28/2025   Diabetic kidney evaluation - eGFR measurement  07/12/2025   Medicare Annual Wellness (AWV)  08/28/2025   MAMMOGRAM  02/18/2026   Colonoscopy  01/11/2029   Pneumococcal Vaccine: 50+ Years  Completed   Influenza Vaccine  Completed   DEXA SCAN  Completed   Hepatitis C Screening  Completed   HPV VACCINES  Aged Out   Meningococcal B Vaccine  Aged Out    Health Maintenance Items Addressed: Yes Patient aware of current care gaps. Patient is due for Dtap, Covid-19 and Shingrix vaccines.  Additional Screening:  Vision Screening: Recommended annual ophthalmology exams for early detection of glaucoma and other disorders of the eye. Is the patient up to date with their annual eye exam?  Yes  Who is the provider or what is the name of the office in which the patient attends annual eye exams? Dr. Inocente Pao and Dr. Paticia Fairly  Dental Screening: Recommended annual dental exams for proper oral hygiene  Community Resource Referral / Chronic Care Management: CRR required this visit?  No   CCM required this visit?  No   Plan:    I have personally reviewed  and noted the following in the patient's chart:   Medical and social history Use of alcohol, tobacco or illicit drugs  Current medications and supplements including opioid prescriptions. Patient is not currently taking opioid prescriptions. Functional ability and status Nutritional status Physical activity Advanced directives List of other physicians Hospitalizations, surgeries, and ER visits in previous 12 months Vitals Screenings to include cognitive, depression, and falls Referrals and appointments  In addition, I have reviewed and discussed with patient certain preventive protocols, quality metrics, and best practice recommendations.  A written personalized care plan for preventive services as well as general preventive health recommendations were provided to patient.   Roz LOISE Fuller, LPN   [id number]   After Visit Summary: (MyChart) Due to this being a telephonic visit, the after visit summary with patients personalized plan was offered to patient via MyChart   Notes: Nothing significant to report at this time.

## 2024-09-04 ENCOUNTER — Encounter

## 2024-09-04 ENCOUNTER — Ambulatory Visit: Admitting: Student

## 2024-09-04 VITALS — BP 152/84 | HR 78 | Temp 97.8°F | Ht 61.0 in | Wt 135.2 lb

## 2024-09-04 DIAGNOSIS — E1142 Type 2 diabetes mellitus with diabetic polyneuropathy: Secondary | ICD-10-CM | POA: Diagnosis not present

## 2024-09-04 DIAGNOSIS — I10 Essential (primary) hypertension: Secondary | ICD-10-CM

## 2024-09-04 DIAGNOSIS — I739 Peripheral vascular disease, unspecified: Secondary | ICD-10-CM | POA: Diagnosis not present

## 2024-09-04 DIAGNOSIS — E1121 Type 2 diabetes mellitus with diabetic nephropathy: Secondary | ICD-10-CM | POA: Diagnosis not present

## 2024-09-04 DIAGNOSIS — Z79899 Other long term (current) drug therapy: Secondary | ICD-10-CM

## 2024-09-04 DIAGNOSIS — Z7984 Long term (current) use of oral hypoglycemic drugs: Secondary | ICD-10-CM

## 2024-09-04 DIAGNOSIS — Z7985 Long-term (current) use of injectable non-insulin antidiabetic drugs: Secondary | ICD-10-CM

## 2024-09-04 NOTE — Assessment & Plan Note (Signed)
 Patient's last urine ACR was measured at 209, 4 months prior.  She is on Ozempic  and Jardiance  25 mg.  Since we are discontinuing olmesartan  2.5 mg due to symptoms of hypotension, I do suspect that her proteinuria will slightly increase.

## 2024-09-04 NOTE — Assessment & Plan Note (Signed)
 Patient is willing to start aspirin  81 mg for peripheral arterial disease.  She denies history of major bleeding or need of transfusions due to bleeding.  Fortunately, patient does not smoke.  Plan: - Continue aspirin  81 mg, continue Lipitor 40 mg

## 2024-09-04 NOTE — Patient Instructions (Signed)
 Thank you, [name] for allowing us  to provide your care today. Today we discussed blood pressure.  For your blood pressure: STOP the olmesartan  2.5 mg. Call the office if your blood pressure continues to be higher than 150/90 for more than 3 days in a row  For your peripheral artery disease: Please start 81 mg   For diabetic neuropathy: Please let us  know if you start to have pain and we can start a medicine         I have ordered the following medication/changed the following medications:   Stop the following medications: Medications Discontinued During This Encounter  Medication Reason   olmesartan  (BENICAR ) 5 MG tablet        Follow up: 3 months     Should you have any questions or concerns please call the internal medicine clinic at [phone].     Please note that our late policy has changed.  If you are more than 15 minutes late to your appointment, you may be asked to reschedule your appointment.  Dr. Kandis, D.O. [facility]

## 2024-09-04 NOTE — Progress Notes (Signed)
 Established Patient Office Visit  Subjective   Patient ID: Amanda [name], female    DOB: February 19, 1951  Age: 73 y.o. MRN: [id number]  Chief Complaint  Patient presents with   Hypertension   Follow-up    Amanda [name] is a 73 y.o. who presents to the clinic for a 2-week follow-up of hypertension and to discuss starting aspirin  for peripheral arterial disease.Amanda [name] Please see problem based assessment and plan for additional details.    Patient Active Problem List   Diagnosis Date Noted   Financial difficulties 09/02/2022   Diabetic nephropathy with proteinuria (HCC) 05/18/2022   Iron  deficiency anemia 12/02/2021   Peripheral arterial disease (HCC) 10/28/2021   CKD stage 3b, GFR 30-44 ml/min (HCC) 01/01/2021   Osteopenia 05/04/2016   MDD (major depressive disorder), recurrent episode, moderate (HCC) 02/08/2016   Pseudophakia of both eyes 07/27/2015   Preventive measure 06/03/2013   Diabetic neuropathy (HCC) 05/20/2013   Seasonal allergies 03/12/2013   Hyperlipidemia associated with type 2 diabetes mellitus (HCC) 02/15/2013   Type II diabetes mellitus with complication (HCC) 02/14/2013   Essential hypertension 05/10/2010       Objective:     BP (!) 152/84 (BP Location: Left Arm, Patient Position: Sitting, Cuff Size: Normal)   Pulse 78   Temp 97.8 F (36.6 C) (Oral)   Ht 5' 1 (1.549 m)   Wt 135 lb 3.2 oz (61.3 kg)   SpO2 100%   BMI 25.55 kg/m  BP Readings from Last 3 Encounters:  09/04/24 (!) 152/84  08/21/24 (!) 152/81  07/16/24 (!) 156/84   Wt Readings from Last 3 Encounters:  09/04/24 135 lb 3.2 oz (61.3 kg)  08/28/24 135 lb (61.2 kg)  08/21/24 135 lb (61.2 kg)      Physical Exam Vitals reviewed.  Constitutional:      General: She is not in acute distress.    Appearance: She is not ill-appearing, toxic-appearing or diaphoretic.  Cardiovascular:     Rate and Rhythm: Normal rate and regular rhythm.  Pulmonary:     Effort: Pulmonary effort is normal. No  respiratory distress.     Breath sounds: Normal breath sounds. No wheezing or rales.  Skin:    General: Skin is warm and dry.  Neurological:     Mental Status: She is alert.     Comments: Decreased pinprick sensation of the left foot greater than the right.  Sensation was soft touch is intact in the bilateral feet.  Psychiatric:        Mood and Affect: Mood normal.     Last metabolic panel Lab Results  Component Value Date   GLUCOSE 110 (H) 07/12/2024   NA 137 07/12/2024   K 4.0 07/12/2024   CL 106 07/12/2024   CO2 23 07/12/2024   BUN 35 (H) 07/12/2024   CREATININE 1.79 (H) 07/12/2024   GFRNONAA 30 (L) 07/12/2024   CALCIUM  8.8 (L) 07/12/2024   PHOS 3.4 09/20/2019   PROT 7.2 07/12/2024   ALBUMIN 3.7 07/12/2024   LABGLOB 2.7 10/23/2017   AGRATIO 1.5 10/23/2017   BILITOT 0.9 07/12/2024   ALKPHOS 54 07/12/2024   AST 22 07/12/2024   ALT 13 07/12/2024   ANIONGAP 8 07/12/2024   Last lipids Lab Results  Component Value Date   CHOL 156 01/09/2024   HDL 69 01/09/2024   LDLCALC 76 01/09/2024   TRIG 51 01/09/2024   CHOLHDL 2.3 01/09/2024   Last hemoglobin A1c Lab Results  Component Value Date   HGBA1C  7.6 (A) 07/16/2024      The 10-year ASCVD risk score (Arnett DK, et al., 2019) is: 31.9%    Assessment & Plan:   Problem List Items Addressed This Visit       Cardiovascular and Mediastinum   Essential hypertension - Primary (Chronic)   Patient is here for 2-week follow-up of hypertension.  Patient brings in her home blood pressure cuff which is accurate and was checked at the last visit.  At the last visit, it was noted that her blood pressures were on the low side of normal and there was concerns about symptoms of hypotension.  At that time, she was taken off of olmesartan  2.5 mg and asked to keep track of her blood pressure over 2 weeks and return.  Patient returns with a blood pressure log over the past 2 weeks with averages primarily around 120s to 130s over 60s  to 70s.  There are some sporadic highs ranging in the 160s and 170s but these are not often.  She denies lightheadedness or dizziness at this time.  Plan: - Will discontinue olmesartan  2.5 mg at this time      Peripheral arterial disease (HCC)   Patient is willing to start aspirin  81 mg for peripheral arterial disease.  She denies history of major bleeding or need of transfusions due to bleeding.  Fortunately, patient does not smoke.  Plan: - Continue aspirin  81 mg, continue Lipitor 40 mg         Endocrine   Diabetic neuropathy (HCC)   Patient reports a longstanding history of diabetic neuropathy with symptoms of paresthesia greater in the left foot versus right.  She denies pain.  On exam, there is decreased sensation to pinprick on the left foot compared to the right.  Plan: - Will continue to work on glycemic control -Without pain being present, there is not a great benefit of starting duloxetine .  If patient returns with complaints of painful neuropathy, would recommend starting Cymbalta  at that time      Diabetic nephropathy with proteinuria (HCC)   Patient's last urine ACR was measured at 209, 4 months prior.  She is on Ozempic  and Jardiance  25 mg.  Since we are discontinuing olmesartan  2.5 mg due to symptoms of hypotension, I do suspect that her proteinuria will slightly increase.       Return in about 3 months (around 12/04/2024) for HTN, DM.    Amanda Lease, DO

## 2024-09-04 NOTE — Assessment & Plan Note (Signed)
 Patient is here for 2-week follow-up of hypertension.  Patient brings in her home blood pressure cuff which is accurate and was checked at the last visit.  At the last visit, it was noted that her blood pressures were on the low side of normal and there was concerns about symptoms of hypotension.  At that time, she was taken off of olmesartan  2.5 mg and asked to keep track of her blood pressure over 2 weeks and return.  Patient returns with a blood pressure log over the past 2 weeks with averages primarily around 120s to 130s over 60s to 70s.  There are some sporadic highs ranging in the 160s and 170s but these are not often.  She denies lightheadedness or dizziness at this time.  Plan: - Will discontinue olmesartan  2.5 mg at this time

## 2024-09-04 NOTE — Assessment & Plan Note (Signed)
 Patient reports a longstanding history of diabetic neuropathy with symptoms of paresthesia greater in the left foot versus right.  She denies pain.  On exam, there is decreased sensation to pinprick on the left foot compared to the right.  Plan: - Will continue to work on glycemic control -Without pain being present, there is not a great benefit of starting duloxetine .  If patient returns with complaints of painful neuropathy, would recommend starting Cymbalta  at that time

## 2024-09-10 ENCOUNTER — Ambulatory Visit (INDEPENDENT_AMBULATORY_CARE_PROVIDER_SITE_OTHER): Admitting: Podiatry

## 2024-09-10 ENCOUNTER — Encounter: Payer: Self-pay | Admitting: Podiatry

## 2024-09-10 VITALS — Ht 61.0 in | Wt 135.2 lb

## 2024-09-10 DIAGNOSIS — B351 Tinea unguium: Secondary | ICD-10-CM | POA: Diagnosis not present

## 2024-09-10 DIAGNOSIS — M79674 Pain in right toe(s): Secondary | ICD-10-CM | POA: Diagnosis not present

## 2024-09-10 DIAGNOSIS — E1142 Type 2 diabetes mellitus with diabetic polyneuropathy: Secondary | ICD-10-CM | POA: Diagnosis not present

## 2024-09-10 DIAGNOSIS — M79675 Pain in left toe(s): Secondary | ICD-10-CM

## 2024-09-10 NOTE — Progress Notes (Signed)
 Internal Medicine Attending:  I reviewed the AWV findings of the medical professional who conducted the visit. I was present in the office suite and immediately available to provide assistance and direction throughout the time the service was provided.

## 2024-09-11 NOTE — Telephone Encounter (Signed)
 Rec'd 4 boxes Ozempic  2mg  dose pens.

## 2024-09-11 NOTE — Progress Notes (Signed)
  Subjective:  Patient ID: Amanda [name], female    DOB: Aug 31, 1951,  MRN: [id number]  Amanda [name] presents to clinic today for: at risk foot care with history of diabetic neuropathy and painful thick toenails that are difficult to trim. Pain interferes with ambulation. Aggravating factors include wearing enclosed shoe gear. Pain is relieved with periodic professional debridement. She has her Dermatology appointment scheduled for January, 2026. Chief Complaint  Patient presents with   Nail Problem    RM 17 RFC. Pt. Is diabetic (A1C 7.5) PCP- Dr. Kenn, last visit 09/04/24.    PCP is D'Mello, Rosalyn, DO.  Allergies  Allergen Reactions   Gabapentin  Other (See Comments)    Pt states medication made her feel out of it.   Pt states medication made her feel out of it.      Review of Systems: Negative except as noted in the HPI.  Objective: No changes noted in today's physical examination. There were no vitals filed for this visit.  Amanda [name] is a pleasant 73 y.o. female in NAD. AAO x 3.  Vascular Examination: Capillary refill time <3 seconds b/l LE. Palpable pedal pulses b/l LE. Digital hair present b/l. No pedal edema b/l. Skin temperature gradient WNL b/l. No varicosities b/l. No cyanosis or clubbing. No ischemia or gangrene. .  Dermatological Examination: Pedal skin with normal turgor, texture and tone b/l. No open wounds. No interdigital macerations b/l. Toenails 1-5 b/l thickened, discolored, dystrophic with subungual debris. There is pain on palpation to dorsal aspect of nailplates. Brownish macule noted plantar sulcus under 5th digit. Mild asymmetry, normal borders. No palpable depth, no elevation, no bleeding.  Neurological Examination: Protective sensation intact with 10 gram monofilament b/l LE. Vibratory sensation intact b/l LE. Pt has subjective symptoms of neuropathy.  Musculoskeletal Examination: Muscle strength 5/5 to all lower extremity muscle groups  bilaterally. Hammertoe(s) b/l 5th toes.. No pain, crepitus or joint limitation noted with ROM b/l LE.  Patient ambulates independently without assistive aids.      Latest Ref Rng & Units 07/16/2024    3:04 PM 03/05/2024   10:39 AM 01/09/2024   10:26 AM 10/09/2023   10:10 AM  Hemoglobin A1C  Hemoglobin-A1c 4.0 - 5.6 % 7.6  7.0  7.3  8.0    Assessment/Plan: 1. Pain due to onychomycosis of toenails of both feet   2. Diabetic peripheral neuropathy associated with type 2 diabetes mellitus (HCC)   Patient was evaluated and treated. All patient's and/or POA's questions/concerns addressed on today's visit. Mycotic toenails 1-5 debrided in length and girth without incident.  Continue daily foot inspections and monitor blood glucose per PCP/Endocrinologist's recommendations.Continue soft, supportive shoe gear daily. Report any pedal injuries to medical professional. Call office if there are any quesitons/concerns. -Patient/POA to call should there be question/concern in the interim.   Return in about 3 months (around 12/10/2024).  Delon LITTIE Merlin, DPM      [place] LOCATION: [address] [place] [address]                   Office [phone]   [facility] LOCATION: [address] [place], [place] [address] Office [phone]

## 2024-09-12 ENCOUNTER — Other Ambulatory Visit (HOSPITAL_COMMUNITY): Payer: Self-pay

## 2024-09-12 ENCOUNTER — Telehealth: Payer: Self-pay | Admitting: *Deleted

## 2024-09-12 ENCOUNTER — Other Ambulatory Visit: Payer: Self-pay

## 2024-09-12 DIAGNOSIS — E118 Type 2 diabetes mellitus with unspecified complications: Secondary | ICD-10-CM

## 2024-09-12 MED ORDER — ACCU-CHEK SOFTCLIX LANCETS MISC
12 refills | Status: AC
  Filled 2024-09-12: qty 102, 91d supply, fill #0
  Filled 2024-09-13: qty 100, 90d supply, fill #0
  Filled 2024-12-23: qty 100, 90d supply, fill #1

## 2024-09-12 MED ORDER — ACCU-CHEK GUIDE TEST VI STRP
ORAL_STRIP | 3 refills | Status: AC
  Filled 2024-09-12 – 2024-09-13 (×2): qty 100, 90d supply, fill #0
  Filled 2024-12-09: qty 100, 100d supply, fill #1

## 2024-09-12 MED ORDER — ACCU-CHEK GUIDE W/DEVICE KIT
PACK | 1 refills | Status: AC
  Filled 2024-09-12 – 2024-09-13 (×2): qty 1, 30d supply, fill #0

## 2024-09-12 NOTE — Telephone Encounter (Signed)
 Left voicemail for return call I am not aware the Dexcom CGM is no longer covered by [facility] ( will ask pharmacy. However, recently the stopped covering the One touch meters and supplies and wonder if this is the letter she has.

## 2024-09-12 NOTE — Telephone Encounter (Signed)
 Called patient and let her know that she can use a meter if her CGM is no longer covered. She requested prescription for same and supplies be sent to [facility].

## 2024-09-12 NOTE — Telephone Encounter (Signed)
 I see now, this was previously denied (on 08/22/24 telephone note).   Patient requires one of the following:   (1) Submission of medical records (for example: chart notes, laboratory values) or claims historydocumenting one of the following:  (A) You are being treated with insulin .  (B) You have a history of problematic hypoglycemia with documentation of recurrent level 2 hypoglycemic events [glucose less than 54mg /dl (3.0 mmol/L)] that persist despite multiple attempts to adjust medication(s) and/or modify the diabetes treatment plan.  (C) You have a history of problematic hypoglycemia with documentation of a history of a level 3 hypoglycemic event [glucose less than 54mg /dl (3.0 mmol/L)] characterized by altered mental and/or physical state requiring third-party assistance for treatment of hypoglycemia.  (2) The treating provider has concluded that you or your caregiver has sufficient training using the continuous glucose monitor (CGM).

## 2024-09-12 NOTE — Telephone Encounter (Signed)
She is on insulin

## 2024-09-12 NOTE — Telephone Encounter (Signed)
 Left message informing patient her shipment is ready for pickup.

## 2024-09-12 NOTE — Telephone Encounter (Signed)
 Copied from CRM #[id number]. Topic: Clinical - Medication Question >> Sep 12, 2024 11:25 AM Merlynn A wrote: Reason for CRM: Patient called in wanting to know if she should use her last sensor for her glucose monitor or if she should be using something else. Patient received letter from [employer] that they are no longer covering the monitor that she has but provided a listing of ones that they are covering. Please contact patient for clarification at [phone]. Patient stated that she is used to and familiar with utilizing the monitor she has now for her readings. >> Sep 12, 2024  1:48 PM Susanna ORN wrote: Patient returning missed call. Called [facility] & spoke with [facility] and she stated that the pharmacist was just calling to let patient know they are working on [place]. Patient stated that's fine. States she has another monitor that she will use until then & she will be awaiting an update.

## 2024-09-12 NOTE — Telephone Encounter (Signed)
 Thank you. I did not realize that she was taken off insulin . That means she may need to use a blood glucose meter now.accu chek or contour I believe. She needs updated test strips and a new rx for softclix lancets whether her CGM is denied or not. [name]

## 2024-09-12 NOTE — Telephone Encounter (Addendum)
 Arland can you assist with this?

## 2024-09-12 NOTE — Telephone Encounter (Signed)
 Copied from CRM #[id number]. Topic: Clinical - Medication Question >> Sep 12, 2024 11:25 AM Merlynn A wrote: Reason for CRM: Patient called in wanting to know if she should use her last sensor for her glucose monitor or if she should be using something else. Patient received letter from [employer] that they are no longer covering the monitor that she has but provided a listing of ones that they are covering. Please contact patient for clarification at [phone]. Patient stated that she is used to and familiar with utilizing the monitor she has now for her readings.

## 2024-09-13 ENCOUNTER — Other Ambulatory Visit (HOSPITAL_COMMUNITY): Payer: Self-pay

## 2024-09-13 ENCOUNTER — Other Ambulatory Visit: Payer: Self-pay

## 2024-09-13 ENCOUNTER — Telehealth: Payer: Self-pay | Admitting: *Deleted

## 2024-09-13 NOTE — Progress Notes (Signed)
 Internal Medicine Clinic Attending  Case discussed with the resident at the time of the visit.  We reviewed the resident's history and exam and pertinent patient test results.  I agree with the assessment, diagnosis, and plan of care documented in the resident's note.

## 2024-09-13 NOTE — Telephone Encounter (Signed)
 Copied from CRM [id number]. Topic: Clinical - Prescription Issue >> Sep 13, 2024  8:21 AM Diannia H wrote: Reason for CRM: Patient called and stated that the pharmacy stated they needed authorization from the provider to fill the last refill for the dezcom. Patient thought she had one more sensor and she does not.

## 2024-09-16 ENCOUNTER — Other Ambulatory Visit: Payer: Self-pay

## 2024-09-16 NOTE — Telephone Encounter (Signed)
 PA was submitted 08/22/24 and was denied please see note.

## 2024-09-24 ENCOUNTER — Telehealth: Payer: Self-pay | Admitting: *Deleted

## 2024-09-24 NOTE — Telephone Encounter (Signed)
 Copied from CRM [id number]. Topic: Clinical - Medication Question >> Sep 24, 2024  2:40 PM Cherylann RAMAN wrote: Reason for CRM: Patient called in with complaints of loose stool. She states that she may have an infection from the stool getting into the vaginal area. She is tender to the touch in the abdomin area. Patient does not want to go to the ER. She denies having a fever and felt nauseated and body sweats. Please advise. Patient can be contacted at [phone]

## 2024-09-24 NOTE — Telephone Encounter (Signed)
 I called pt - no answer. An appt has been scheduled 10/15 with Dr Kem.

## 2024-09-25 NOTE — Telephone Encounter (Signed)
 Talked to pt who state she no longer has loose stools. Feels a little weak, no fever, and decreased abd bloating. Pt made aware to go to ER if symptoms worsen.

## 2024-09-30 ENCOUNTER — Other Ambulatory Visit (HOSPITAL_COMMUNITY): Payer: Self-pay

## 2024-10-01 ENCOUNTER — Other Ambulatory Visit (HOSPITAL_COMMUNITY): Payer: Self-pay

## 2024-10-02 ENCOUNTER — Ambulatory Visit (INDEPENDENT_AMBULATORY_CARE_PROVIDER_SITE_OTHER): Payer: Self-pay

## 2024-10-02 VITALS — BP 167/81 | HR 77 | Wt 130.8 lb

## 2024-10-02 DIAGNOSIS — K5901 Slow transit constipation: Secondary | ICD-10-CM

## 2024-10-02 DIAGNOSIS — Z7985 Long-term (current) use of injectable non-insulin antidiabetic drugs: Secondary | ICD-10-CM | POA: Diagnosis not present

## 2024-10-02 DIAGNOSIS — L299 Pruritus, unspecified: Secondary | ICD-10-CM | POA: Diagnosis not present

## 2024-10-02 DIAGNOSIS — I10 Essential (primary) hypertension: Secondary | ICD-10-CM | POA: Diagnosis not present

## 2024-10-02 NOTE — Patient Instructions (Signed)
 Today we discussed the following medical conditions and plan:   For the itchiness under your armpits and in your breast. For this we will try clotrimazole  .1 % ointment once a day in those areas.   For fiber supplement, try eating berries. You can try metamucil, benefiber or miralax    We look forward to seeing you next time. Please call our clinic at [phone] if you have any questions or concerns. The best time to call is Monday-Friday from 9am-4pm, but there is someone available 24/7. If you need medication refills, please notify your pharmacy one week in advance and they will send us  a request.   Thank you for trusting me with your care. Wishing you the best!   Karene Bracken D'Mello, DO  [facility]

## 2024-10-02 NOTE — Telephone Encounter (Signed)
 Patient given medication at appt. Also given the following info:   We wanted to let you know about an important upcoming change that may impact one of your prescribed medications. [employer], the maker of Ozempic  and Rybelsus , has announced that they will no longer offer these medications to Medicare beneficiaries through their Patient Assistance Program in 2026.    This means that if you currently receive Ozempic  or Rybelsus  at no cost through the program, starting in January 2026 you'll need to use your insurance to continue on these medicines. Please review the below information. If you have questions, please respond to this message and one of our pharmacists will reach out.

## 2024-10-02 NOTE — Progress Notes (Signed)
 Established Patient Office Visit  Subjective   Patient ID: Amanda [name], female    DOB: 06-02-1951  Age: 73 y.o. MRN: [id number]  No chief complaint on file.   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  See problem-based assessment for more details   ROS See problem-based assessment for more detail   Objective:     BP (!) 167/81 (BP Location: Left Arm, Patient Position: Sitting, Cuff Size: Small)   Pulse 77   Wt 130 lb 12.8 oz (59.3 kg)   SpO2 98%   BMI 24.71 kg/m  BP Readings from Last 3 Encounters:  10/02/24 (!) 167/81  09/04/24 (!) 152/84  08/21/24 (!) 152/81   Wt Readings from Last 3 Encounters:  10/02/24 130 lb 12.8 oz (59.3 kg)  09/10/24 135 lb 3.2 oz (61.3 kg)  09/04/24 135 lb 3.2 oz (61.3 kg)      Physical Exam Constitution: Alert, no acute distress Heart: Regular rate and rhythm, no murmurs. Lungs: Respiratory effort normal, lungs clear to auscultation Armpits bilaterally are clear of any open wounds, boils, blisters or rashes.  No erythema noted bilaterally. Area under breast does not show any open wounds, erythema, rashes.  Skin tone even across the area under breast.  No results found for any visits on 10/02/24.    The 10-year ASCVD risk score (Arnett DK, et al., 2019) is: 36.6%    Assessment & Plan:     Assessment & Plan Essential hypertension Patient's initial blood pressure was 144/88 and repeat was 167/81.  Patient did bring in her blood pressure log which did show blood pressures this morning in the 110s 120s.  Does seem like she gets some elevated blood pressures in the morning around 7 AM.  Was previously on olmesartan  2.5 mg and reports having dizziness with blood pressures in the 90s systolics.  Plan: Encouraged patient to  bring in her blood pressure cuff to the next visit and also her blood pressure log.  Did tell her that if she is having consistent readings in the 140s 150s that she should schedule appointment sooner than her next appointment. Itching Patient states that she has had itch under bilateral armpits and breast for the last month and a half.  She states that she has tried Vaseline, ointment, also changing deodorant to spray from stick, [employer] baby powder.  She says the only thing that has given her any relief is triple antibiotic cream.  States that this works within 30 minutes.  She denies any lumps or skin changes that she has noticed to her breast.  Physical exam does not show any rashes in the area of her armpits or under her breast.  Plan: This is likely fungal infection.  Will prescribe clotrimazole  0.1% for patient to use daily in these areas. Constipation, slow transit Patient reports that she has alternating constipation and diarrhea.  Upon chart review reveals that patient has had a history of chronic diarrhea which she is taking Imodium before for.  Denies any hematochezia or melena.  She is up-to-date on her colonoscopies.  Was shown to have H. pylori previously and completed treatment for this.  Patient states that she has noticed that this constipation causes her to not have a bowel movement for about 4 days and then she gets bloating and feels  gassy.  Also has noticed some bad breath.  Denies any reflux symptoms.  Discussed that this is likely a side effect of her Ozempic  and she does state that she has noticed the symptoms more since going up to the 2 mg dose.  Plan: Did discuss adding fiber either through diet or with Benefiber, Metamucil or MiraLAX  to aid in daily soft bowel movement.    Return in about 9 weeks (around 12/04/2024).    Jerone Cudmore D'Mello, DO Patient seen with Dr. Rosan

## 2024-10-02 NOTE — Assessment & Plan Note (Addendum)
 Patient reports that she has alternating constipation and diarrhea.  Upon chart review reveals that patient has had a history of chronic diarrhea which she is taking Imodium before for.  Denies any hematochezia or melena.  She is up-to-date on her colonoscopies.  Was shown to have H. pylori previously and completed treatment for this.  Patient states that she has noticed that this constipation causes her to not have a bowel movement for about 4 days and then she gets bloating and feels gassy.  Also has noticed some bad breath.  Denies any reflux symptoms.  Discussed that this is likely a side effect of her Ozempic  and she does state that she has noticed the symptoms more since going up to the 2 mg dose.  Plan: Did discuss adding fiber either through diet or with Benefiber, Metamucil or MiraLAX  to aid in daily soft bowel movement.

## 2024-10-02 NOTE — Assessment & Plan Note (Addendum)
 Patient's initial blood pressure was 144/88 and repeat was 167/81.  Patient did bring in her blood pressure log which did show blood pressures this morning in the 110s 120s.  Does seem like she gets some elevated blood pressures in the morning around 7 AM.  Was previously on olmesartan  2.5 mg and reports having dizziness with blood pressures in the 90s systolics.  Plan: Encouraged patient to bring in her blood pressure cuff to the next visit and also her blood pressure log.  Did tell her that if she is having consistent readings in the 140s 150s that she should schedule appointment sooner than her next appointment.

## 2024-10-02 NOTE — Progress Notes (Signed)
 Internal Medicine Clinic Attending  I was physically present during the key portions of the resident provided service and participated in the medical decision making of patient's management care. I reviewed pertinent patient test results.  The assessment, diagnosis, and plan were formulated together and I agree with the documentation in the resident's note.  Rosan Dayton BROCKS, DO

## 2024-10-08 ENCOUNTER — Other Ambulatory Visit (HOSPITAL_COMMUNITY): Payer: Self-pay

## 2024-10-09 ENCOUNTER — Other Ambulatory Visit (HOSPITAL_COMMUNITY): Payer: Self-pay

## 2024-10-09 LAB — HM DIABETES EYE EXAM

## 2024-10-09 MED ORDER — TIMOLOL MALEATE 0.5 % OP SOLN
1.0000 [drp] | Freq: Two times a day (BID) | OPHTHALMIC | 2 refills | Status: AC
  Filled 2024-10-09: qty 10, 50d supply, fill #0
  Filled 2024-12-09: qty 10, 50d supply, fill #1
  Filled 2025-01-14: qty 10, 50d supply, fill #2

## 2024-10-29 NOTE — Progress Notes (Deleted)
 Ellouise Console, PA-C [address] [place], [place]  [address] Phone: [phone]   Gastroenterology Consultation  Referring Provider:     Kem Na, DO Primary Care Physician:  D'Mello, Rosalyn, DO Primary Gastroenterologist:  Ellouise Console, PA-C / *** Reason for Consultation:     Abdominal pain        HPI:   Discussed the use of AI scribe software for clinical note transcription with the patient, who gave verbal consent to proceed.  07/12/2024 CT abdomen pelvis without contrast: 1. No acute intra-abdominal or pelvic pathology. No hydronephrosis or nephrolithiasis. 2. Cholelithiasis. 3.  Aortic Atherosclerosis  History of Present Illness   PMH: Hypertension, PAD, constipation, type 2 diabetes, diabetic nephropathy and neuropathy, CKD 3B, IDA.  05/2021 EGD by Dr. Teressa: Small to medium amount of food in the stomach.  Mild gastritis.  Otherwise normal.  Suspected gastroparesis.  Biopsies positive for H. pylori.  Treated with Pylera.  Biopsies negative for celiac.  12/2018 colonoscopy: 1 tiny 1 mm colon mucosal polyp removed.  Good prep.  Biopsies negative for microscopic colitis.  10-year repeat (due 12/2028).  05/2014 colonoscopy: Biopsies negative for microscopic colitis.    Past Medical History:  Diagnosis Date   Allergy    SEASONAL   Arthritis    Bacterial sinusitis 12/09/2023   Cervical radiculopathy due to degenerative joint disease of spine 05/03/2017   CKD (chronic kidney disease) stage 3, GFR 30-59 ml/min (HCC) 01/01/2021   Diabetes mellitus 1996   Diabetic neuropathy (HCC) 05/20/2013   Glaucoma    Grief 08/07/2023   H. pylori infection    Hyperlipidemia    Hypertension    Normocytic anemia 02/14/2013   Osteopenia 05/04/2016   Pain in both lower extremities 02/14/2024   Pain of both shoulder joints 11/14/2016   Pruritus 12/09/2023    Past Surgical History:  Procedure Laterality Date   ABDOMINAL HYSTERECTOMY     10 years ago    BREAST  BIOPSY     BREAST LUMPECTOMY WITH RADIOACTIVE SEED LOCALIZATION Right 02/16/2022   Procedure: RIGHT BREAST LUMPECTOMY WITH RADIOACTIVE SEED LOCALIZATION;  Surgeon: Vanderbilt Ned, MD;  Location: [facility];  Service: General;  Laterality: Right;   Cataracts     ROTATOR CUFF REPAIR     SHOULDER SURGERY     Rotator cuff tear     Prior to Admission medications   Medication Sig Start Date End Date Taking? Authorizing Provider  Accu-Chek Softclix Lancets lancets Use to check blood sugar up to 1 time a day 09/12/24   Zheng, Michael, DO  acetaminophen  (TYLENOL ) 325 MG tablet Take 2 tablets (650 mg total) by mouth every 6 (six) hours as needed (or Fever >/= 101). 01/05/13   Dianna Pao, MD  atorvastatin  (LIPITOR) 40 MG tablet Take 1 tablet (40 mg total) by mouth daily. 03/15/24   [name], Katie, DO  benzonatate  (TESSALON  PERLES) 100 MG capsule Take 1 capsule (100 mg total) by mouth every 6 (six) hours as needed for cough. Take around bedtime/at night to help suppress cough. 06/05/24 06/05/25  Arellano Zameza, Priscila, MD  blood glucose meter kit and supplies KIT Dispense based on patient and insurance preference. Use up to four times daily as directed. . 09/30/19   Francesco Elsie NOVAK, MD  Blood Glucose Monitoring Suppl (ACCU-CHEK GUIDE) w/Device KIT Use as directed to check glood sugar levels once daily 09/12/24   Elicia Sharper, DO  Continuous Glucose Receiver (DEXCOM G7 RECEIVER) DEVI Use to check blood  glucose before upon waking up, meals, at bedtime, and as needed, 07/09/24   Elnora Ip, MD  Continuous Glucose Sensor (DEXCOM G7 SENSOR) MISC Apply sensor to back of arm to continuously monitor blood glucose, changing the sensor every 10 days as directed. 08/21/24   Gomez-Caraballo, Maria, MD  diclofenac  Sodium (VOLTAREN ) 1 % GEL Apply 2 g topically daily as needed (hand pain). 11/17/22   Lemon Raisin, MD  empagliflozin  (JARDIANCE ) 25 MG TABS tablet Take 1 tablet (25 mg total) by mouth daily before  breakfast. 05/27/24   Elicia Sharper, DO  fluticasone  (FLONASE ) 50 MCG/ACT nasal spray Place 1 spray into both nostrils daily. 06/05/24   Arellano Zameza, Priscila, MD  glucose 4 GM chewable tablet Chew 1 tablet (4 g total) by mouth as needed for low blood sugar. 02/06/23   [name], Izetta, DO  glucose blood (ACCU-CHEK GUIDE TEST) test strip Check blood sugar up to 1 time a day 09/12/24   Zheng, Michael, DO  guaiFENesin  (MUCINEX ) 600 MG 12 hr tablet Take 1 tablet (600 mg total) by mouth 2 (two) times daily. Take during the day to help cough out sputum. 06/05/24 06/05/25  Arellano Zameza, Priscila, MD  hydrocortisone  1 % lotion Apply 1 Application topically 2 (two) times daily as needed for itching. 04/26/24   Fernand Prost, MD  hydrOXYzine  (ATARAX ) 50 MG tablet Take 1 tablet (50 mg total) by mouth 3 (three) times daily as needed. 12/08/23   Fernand Prost, MD  latanoprost  (XALATAN ) 0.005 % ophthalmic solution Place 1 drop into both eyes at bedtime.    [provider]  latanoprost  (XALATAN ) 0.005 % ophthalmic solution Place 1 drop into both eyes daily. 12/19/23     Multiple Vitamins-Minerals (WOMENS 50+ MULTI VITAMIN/MIN) TABS Take 1 tablet by mouth daily. 03/18/14   Rosan Dayton BROCKS, DO  Olopatadine  HCl (PATADAY ) 0.7 % SOLN Place 1 drop into both eyes daily. 05/06/24     Semaglutide , 2 MG/DOSE, (OZEMPIC , 2 MG/DOSE,) 8 MG/3ML SOPN Inject 2 mg into the skin once a week. 02/13/24   Jolaine Pac, DO  timolol (TIMOPTIC) 0.5 % ophthalmic solution Place 1 drop into both eyes 2 (two) times daily. 10/09/24       Family History  Problem Relation Age of Onset   Diabetes Mother    Hypertension Mother    Breast cancer Mother    Cancer Mother    Diabetes Sister    Hypertension Sister    Kidney disease Sister        One Kidney transplant, one sister on HD   Breast cancer Sister    Cancer Sister    Hypertension Sister    Diabetes Sister    Cerebral palsy Brother    Cancer Brother        Unknown    Hypertension Daughter    Colon cancer Neg Hx    Esophageal cancer Neg Hx    Stomach cancer Neg Hx    Rectal cancer Neg Hx      Social History   Tobacco Use   Smoking status: Never   Smokeless tobacco: Never  Vaping Use   Vaping status: Never Used  Substance Use Topics   Alcohol use: No    Alcohol/week: 0.0 standard drinks of alcohol   Drug use: No    Allergies as of 10/30/2024 - Review Complete 09/10/2024  Allergen Reaction Noted   Gabapentin  Other (See Comments) 01/17/2014    Review of Systems:    All systems reviewed and negative except where  noted in HPI.   Physical Exam:  There were no vitals taken for this visit. No LMP recorded. Patient has had a hysterectomy.  General:   Alert,  Well-developed, well-nourished, pleasant and cooperative in NAD Lungs:  Respirations even and unlabored.  Clear throughout to auscultation.   No wheezes, crackles, or rhonchi. No acute distress. Heart:  Regular rate and rhythm; no murmurs, clicks, rubs, or gallops. Abdomen:  Normal bowel sounds.  No bruits.  Soft, and non-distended without masses, hepatosplenomegaly or hernias noted.  No Tenderness.  No guarding or rebound tenderness.    Neurologic:  Alert and oriented x3;  grossly normal neurologically. Psych:  Alert and cooperative. Normal mood and affect.   Imaging Studies: No results found.  Labs: CBC    Component Value Date/Time   WBC 4.5 07/12/2024 1407   RBC 3.65 (L) 07/12/2024 1407   HGB 10.9 (L) 07/12/2024 1407   HGB 10.2 (L) 02/20/2023 1117   HCT 34.0 (L) 07/12/2024 1407   HCT 31.7 (L) 02/20/2023 1117   PLT 241 07/12/2024 1407   PLT 343 02/20/2023 1117   MCV 93.2 07/12/2024 1407   MCV 93 02/20/2023 1117    CMP     Component Value Date/Time   NA 137 07/12/2024 1407   NA 140 02/13/2024 1559   K 4.0 07/12/2024 1407   CL 106 07/12/2024 1407   CO2 23 07/12/2024 1407   GLUCOSE 110 (H) 07/12/2024 1407   BUN 35 (H) 07/12/2024 1407   BUN 36 (H) 02/13/2024 1559    CREATININE 1.79 (H) 07/12/2024 1407   CREATININE 0.97 06/16/2015 1553   CALCIUM  8.8 (L) 07/12/2024 1407   PROT 7.2 07/12/2024 1407   PROT 6.7 10/23/2017 1505   ALBUMIN 3.7 07/12/2024 1407   ALBUMIN 4.0 10/23/2017 1505   AST 22 07/12/2024 1407   ALT 13 07/12/2024 1407   ALKPHOS 54 07/12/2024 1407   BILITOT 0.9 07/12/2024 1407   BILITOT 0.2 10/23/2017 1505   GFRNONAA 30 (L) 07/12/2024 1407   GFRNONAA 63 06/16/2015 1553   GFRAA 43 (L) 12/30/2020 1349   GFRAA 72 06/16/2015 1553    Assessment and Plan:   Amanda [name] is a 73 y.o. y/o female has been referred for   1.  Abdominal pain  2.  Cholelithiasis  3.  History of H. pylori gastritis - H. pylori stool test  4. Diabetic gastroparesis  5.  Chronic intermittent diarrhea: Prior colonoscopy showed biopsies negative for microscopic colitis. - Check fecal pancreatic elastase for EPI  Assessment and Plan Assessment & Plan       Follow up ***  Ellouise Console, PA-C

## 2024-10-30 ENCOUNTER — Ambulatory Visit: Admitting: Physician Assistant

## 2024-11-22 ENCOUNTER — Ambulatory Visit: Payer: Self-pay

## 2024-11-27 ENCOUNTER — Encounter: Payer: Self-pay | Admitting: Dietician

## 2024-11-27 ENCOUNTER — Other Ambulatory Visit: Payer: Self-pay

## 2024-11-27 ENCOUNTER — Ambulatory Visit: Payer: Self-pay

## 2024-11-27 VITALS — BP 144/84 | HR 83 | Temp 98.3°F | Ht 61.5 in | Wt 128.7 lb

## 2024-11-27 DIAGNOSIS — E118 Type 2 diabetes mellitus with unspecified complications: Secondary | ICD-10-CM

## 2024-11-27 DIAGNOSIS — I10 Essential (primary) hypertension: Secondary | ICD-10-CM

## 2024-11-27 DIAGNOSIS — Z299 Encounter for prophylactic measures, unspecified: Secondary | ICD-10-CM

## 2024-11-27 DIAGNOSIS — E1169 Type 2 diabetes mellitus with other specified complication: Secondary | ICD-10-CM

## 2024-11-27 LAB — GLUCOSE, CAPILLARY: Glucose-Capillary: 93 mg/dL (ref 70–99)

## 2024-11-27 LAB — POCT GLYCOSYLATED HEMOGLOBIN (HGB A1C): HbA1c, POC (controlled diabetic range): 7.5 % — AB (ref 0.0–7.0)

## 2024-11-27 NOTE — Progress Notes (Signed)
 Patient name: Amanda [name] Date of birth: 1950-12-22 Date of visit: 11/28/2024  Type of visit: Established Patient Office Visit   Subjective   Chief concern:  Chief Complaint  Patient presents with   Follow-up    Routine office visit for bp and dm check  / still having abd pain    Amanda [name] is a 73 y.o. female with a history of DMII c/b neuropathy, HTN, HLD, CKD3b, MDD, IDA,  who presents to [facility] clinic for follow up of chronic conditions.  Patient presents to clinic today for follow-up regarding diabetes. Last A1c from 07/16/2024 was 7.6.  She currently takes Jardiance  25 mg daily and Ozempic  2 mg weekly injection.  She was previously on insulin  degludec 60 units daily, though this was discontinued in 05/2024 as A1c was improving.  She reports that she was notified by [employer] they were going to discontinue Ozempic  as well as discontinued her CGM.  She has gone back to taking manual glucose checks.  Her average sugars in the morning are in the 150s.  Based on chart review it is unclear if she is getting Ozempic  through [facility], or our [facility] med assistance program.  Her blood pressure is elevated today 157/93 and 144/84 on repeat.  She was previously on amlodipine , then trialed chlorthalidone , then a low-dose of olmesartan , but she had to discontinue all of these due to episodes of orthostatic hypotension and symptomatic hypotension.  She brings in her blood pressure log today, and roughly 85% of her blood pressures are low-normal range, with very few that are elevated to SBP 140s to 150s.   She is due for her Tdap and is willing to obtain this today.  ROS: Denies headaches, dizziness, fever, chills, runny nose, sore throat, vision changes, hearing changes, chest pain, shortness of breath, difficulty breathing, nausea, vomiting, abdominal pain. Denies increased urinary frequency, pain with urination, constipation or diarrhea. No recent falls.   Patient Active Problem List    Diagnosis Date Noted   Financial difficulties 09/02/2022   Diabetic nephropathy with proteinuria (HCC) 05/18/2022   Iron  deficiency anemia 12/02/2021   Peripheral arterial disease 10/28/2021   CKD stage 3b, GFR 30-44 ml/min (HCC) 01/01/2021   Osteopenia 05/04/2016   MDD (major depressive disorder), recurrent episode, moderate (HCC) 02/08/2016   Pseudophakia of both eyes 07/27/2015   Constipation, slow transit 09/16/2014   Preventive measure 06/03/2013   Diabetic neuropathy (HCC) 05/20/2013   Seasonal allergies 03/12/2013   Hyperlipidemia associated with type 2 diabetes mellitus (HCC) 02/15/2013   Type II diabetes mellitus with complication (HCC) 02/14/2013   Essential hypertension 05/10/2010     Past Surgical History:  Procedure Laterality Date   ABDOMINAL HYSTERECTOMY     10 years ago    BREAST BIOPSY     BREAST LUMPECTOMY WITH RADIOACTIVE SEED LOCALIZATION Right 02/16/2022   Procedure: RIGHT BREAST LUMPECTOMY WITH RADIOACTIVE SEED LOCALIZATION;  Surgeon: Vanderbilt Ned, MD;  Location: [facility] OR;  Service: General;  Laterality: Right;   Cataracts     ROTATOR CUFF REPAIR     SHOULDER SURGERY     Rotator cuff tear      Current Outpatient Medications  Medication Instructions   Accu-Chek Softclix Lancets lancets Use to check blood sugar up to 1 time a day   acetaminophen  (TYLENOL ) 650 mg, Oral, Every 6 hours PRN   atorvastatin  (LIPITOR) 40 mg, Oral, Daily   benzonatate  (TESSALON  PERLES) 100 mg, Oral, Every 6 hours PRN, Take around bedtime/at night to  help suppress cough.   blood glucose meter kit and supplies KIT Dispense based on patient and insurance preference. Use up to four times daily as directed. .   Blood Glucose Monitoring Suppl (ACCU-CHEK GUIDE) w/Device KIT Use as directed to check glood sugar levels once daily   diclofenac  Sodium (VOLTAREN ) 2 g, Topical, Daily PRN   fluticasone  (FLONASE ) 50 MCG/ACT nasal spray 1 spray, Each Nare, Daily   glucose blood (ACCU-CHEK GUIDE  TEST) test strip Check blood sugar up to 1 time a day   glucose 4 g, Oral, As needed   hydrocortisone  1 % lotion 1 Application, Topical, 2 times daily PRN   hydrOXYzine  (ATARAX ) 50 mg, Oral, 3 times daily PRN   Jardiance  25 mg, Oral, Daily before breakfast   latanoprost  (XALATAN ) 0.005 % ophthalmic solution 1 drop, Daily at bedtime   latanoprost  (XALATAN ) 0.005 % ophthalmic solution 1 drop, Both Eyes, Daily   Multiple Vitamins-Minerals (WOMENS 50+ MULTI VITAMIN/MIN) TABS 1 tablet, Oral, Daily   Olopatadine  HCl (PATADAY ) 0.7 % SOLN 1 drop, Both Eyes, Daily   Ozempic  (2 MG/DOSE) 2 mg, Subcutaneous, Weekly   timolol  (TIMOPTIC ) 0.5 % ophthalmic solution 1 drop, Both Eyes, 2 times daily    Social History   Tobacco Use   Smoking status: Never   Smokeless tobacco: Never  Vaping Use   Vaping status: Never Used  Substance Use Topics   Alcohol use: No    Alcohol/week: 0.0 standard drinks of alcohol   Drug use: No      Objective  Today's Vitals   11/27/24 1336 11/27/24 1343  BP: (!) 157/93 (!) 144/84  Pulse: 83 83  Temp: 98.3 F (36.8 C)   TempSrc: Oral   SpO2: 94%   Weight: 128 lb 11.2 oz (58.4 kg)   Height: 5' 1.5 (1.562 m)   PainSc: 8    PainLoc: Abdomen   Body mass index is 23.92 kg/m.   Physical Exam:   Constitutional: well-appearing female sitting in exam chair, in no acute distress. Ambulates without use of assistance device HEENT: normocephalic atraumatic, mucous membranes moist Eyes: conjunctiva non-erythematous Cardiovascular: regular rate and rhythm, bilateral radial pulses 2+, bilateral dorsal pedal pulses 2+, brisk capillary refill bilateral feet and hands  Pulmonary/Chest: normal work of breathing on room air, lungs clear to auscultation bilaterally Abdominal: soft, non-tender, non-distended MSK: normal bulk and tone. Neurological: alert & oriented x 3 Skin: warm and dry Psych: mood calm, behavior normal, thought content normal, judgement normal      The  10-year ASCVD risk score (Arnett DK, et al., 2019) is: 29.2%   Values used to calculate the score:     Age: 59 years     Clinically relevant sex: Female     Is Non-Hispanic African American: Yes     Diabetic: Yes     Tobacco smoker: No     Systolic Blood Pressure: 144 mmHg     Is BP treated: Yes     HDL Cholesterol: 73 mg/dL     Total Cholesterol: 152 mg/dL      Assessment & Plan  Problem List Items Addressed This Visit       Cardiovascular and Mediastinum   Essential hypertension (Chronic)   Her blood pressure is elevated today 157/93 and 144/84 on repeat.  She was previously on amlodipine , then trialed chlorthalidone , then a low-dose of olmesartan , but she had to discontinue all of these due to episodes of orthostatic hypotension and symptomatic hypotension.  She brings in her  blood pressure log today, and roughly 85% of her blood pressures are low-normal range, with very few that are elevated to SBP 140s to 150s.  I have asked her to continue to monitor her blood pressure, and if the elevated readings are persistent or more frequent, to let us  know and come back into the office to discuss further, though with the majority of her readings being low normal do not feel indicated for her to begin medication at this time especially with her history of hypotensive episodes while on other BP medications. - Continue to monitor home BP readings, if they are consistently SBP > 140s then she will let us  know         Endocrine   Type II diabetes mellitus with complication (HCC) - Primary   Last A1c from 06/26/2024 was 7.6, it has stayed mostly stable at 7.5 today.  She currently takes Jardiance  25 mg daily and Ozempic  2 mg weekly injection.  She was previously on insulin  degludec 60 units daily, though this was discontinued in 05/2024 as A1c was improving.  She reports that she was notified by [employer] they were going to discontinue Ozempic  as well as discontinued her CGM.  She has gone back  to taking manual glucose checks.  Her average sugars in the morning are in the 150s.  Based on chart review it is unclear if she is getting Ozempic  through [facility], or our [facility] med assistance program.  I have asked her to reach out to [facility] to determine if there will be alternative coverage for GLP-1, and I have reached out to [place] to see if she in fact receives her Ozempic  through the [facility] med assistance program, and if there is anything on our end that we can do to continue GLP-1 treatment as she has had good response to this.  I discussed with her that for her age, she has at goal for her A1c being 7.5, though encouraged her to continue her dietary and lifestyle modifications.  I discussed with her about meeting with RD Arland Hole again, and she is willing to do this to further discuss nutrition. - Continue Jardiance  25 mg daily - Continue Ozempic  2 mg weekly - She will reach out to [facility] to find out if GLP-1 alternative coverage for 2026, I have reached out to [place] regarding this as well - Referral placed for return visit with RD Arland Plyler       Relevant Orders   POCT glycosylated hemoglobin (Hb A1C) (Completed)   Lipid Profile (Completed)   Referral to Nutrition and Diabetes Services   Hyperlipidemia associated with type 2 diabetes mellitus (HCC)   Patient takes atorvastatin  40 mg daily which she reports complete adherence to.  Last LDL in 12/2023 was 76.  Given her history of diabetes, goal LDL of < 70.  We will recheck lipid panel today, and make adjustments to statin if needed though otherwise we will continue her on atorvastatin  40 mg daily. - Continue Atorvastatin  40 mg daily - f/u lipid panel       Relevant Orders   Lipid Profile (Completed)   Referral to Nutrition and Diabetes Services     Other   Preventive measure   Tdap given today.  She received her flu vaccine on 08/21/2024 in the [facility]. - Tdap given today        Return in about 3 months (around 02/25/2025) for A1C  recheck.  Patient discussed with Dr. Rosan, who also saw and evaluated the  patient.  Doyal Miyamoto, MD [facility]  PGY-1 11/28/2024, 11:35 AM

## 2024-11-27 NOTE — Patient Instructions (Addendum)
 Thank you, [name] for allowing us  to provide your care today. Today we discussed the following:  - I will reach out to [facility] regarding the Ozempic , but if you will please call [employer] to check with them as well! - I do recommend obtaining your RSV vaccine from the pharmacy when able - I also recommend obtaining your Shingles vaccine as well - I will call or send you a MyChart message with your lab results - Continue to take Aspirin  81 mg daily for your Peripheral Arterial Disease (PAD)   I have ordered the following labs for you:   Lab Orders         Glucose, capillary         POCT glycosylated hemoglobin (Hb A1C)       Follow up: 3 months    Remember: Please discuss with [facility] whether they will pay for alternative GLP-1's (Ozempic ).   Should you have any questions or concerns please call the Internal Medicine Clinic at [phone].    Hope you have a Happy Holidays!  Doyal Miyamoto, MD [facility]

## 2024-11-28 ENCOUNTER — Ambulatory Visit: Payer: Self-pay

## 2024-11-28 ENCOUNTER — Telehealth: Payer: Self-pay | Admitting: *Deleted

## 2024-11-28 LAB — LIPID PANEL
Chol/HDL Ratio: 2.1 ratio (ref 0.0–4.4)
Cholesterol, Total: 152 mg/dL (ref 100–199)
HDL: 73 mg/dL (ref >39)
LDL Chol Calc (NIH): 67 mg/dL (ref 0–99)
Triglycerides: 58 mg/dL (ref 0–149)
VLDL Cholesterol Cal: 12 mg/dL (ref 5–40)

## 2024-11-28 NOTE — Assessment & Plan Note (Signed)
 Last A1c from 06/26/2024 was 7.6, it has stayed mostly stable at 7.5 today.  She currently takes Jardiance  25 mg daily and Ozempic  2 mg weekly injection.  She was previously on insulin  degludec 60 units daily, though this was discontinued in 05/2024 as A1c was improving.  She reports that she was notified by [employer] they were going to discontinue Ozempic  as well as discontinued her CGM.  She has gone back to taking manual glucose checks.  Her average sugars in the morning are in the 150s.  Based on chart review it is unclear if she is getting Ozempic  through [facility], or our [facility] med assistance program.  I have asked her to reach out to [facility] to determine if there will be alternative coverage for GLP-1, and I have reached out to [place] to see if she in fact receives her Ozempic  through the [facility] med assistance program, and if there is anything on our end that we can do to continue GLP-1 treatment as she has had good response to this.  I discussed with her that for her age, she has at goal for her A1c being 7.5, though encouraged her to continue her dietary and lifestyle modifications.  I discussed with her about meeting with RD Arland Hole again, and she is willing to do this to further discuss nutrition. - Continue Jardiance  25 mg daily - Continue Ozempic  2 mg weekly - She will reach out to [facility] to find out if GLP-1 alternative coverage for 2026, I have reached out to [place] regarding this as well - Referral placed for return visit with RD Arland Hole

## 2024-11-28 NOTE — Assessment & Plan Note (Signed)
 Patient takes atorvastatin  40 mg daily which she reports complete adherence to.  Last LDL in 12/2023 was 76.  Given her history of diabetes, goal LDL of < 70.  We will recheck lipid panel today, and make adjustments to statin if needed though otherwise we will continue her on atorvastatin  40 mg daily. - Continue Atorvastatin  40 mg daily - f/u lipid panel

## 2024-11-28 NOTE — Assessment & Plan Note (Signed)
 Her blood pressure is elevated today 157/93 and 144/84 on repeat.  She was previously on amlodipine , then trialed chlorthalidone , then a low-dose of olmesartan , but she had to discontinue all of these due to episodes of orthostatic hypotension and symptomatic hypotension.  She brings in her blood pressure log today, and roughly 85% of her blood pressures are low-normal range, with very few that are elevated to SBP 140s to 150s.  I have asked her to continue to monitor her blood pressure, and if the elevated readings are persistent or more frequent, to let us  know and come back into the office to discuss further, though with the majority of her readings being low normal do not feel indicated for her to begin medication at this time especially with her history of hypotensive episodes while on other BP medications. - Continue to monitor home BP readings, if they are consistently SBP > 140s then she will let us  know

## 2024-11-28 NOTE — Telephone Encounter (Signed)
 Will forward to Dr. Nguyen.                Copied from CRM #[id number]. Topic: Clinical - Lab/Test Results >> Nov 28, 2024  1:38 PM Susanna ORN wrote: Reason for CRM: Patient returning missed call. Informed her that it was the provider, Diana Nguyen, giving her a call to provide results on her cholesterol panel that she had. Read results to patient per chart note. Patient wanted to thank Dr. Nguyen as well.

## 2024-11-28 NOTE — Assessment & Plan Note (Signed)
 Tdap given today.  She received her flu vaccine on 08/21/2024 in the [facility]. - Tdap given today

## 2024-12-05 NOTE — Progress Notes (Signed)
 Internal Medicine Clinic Attending  Case discussed with the resident at the time of the visit.  We reviewed the resident's history and exam and pertinent patient test results.  I agree with the assessment, diagnosis, and plan of care documented in the resident's note.

## 2024-12-09 ENCOUNTER — Other Ambulatory Visit (HOSPITAL_COMMUNITY): Payer: Self-pay

## 2024-12-10 ENCOUNTER — Other Ambulatory Visit (HOSPITAL_COMMUNITY): Payer: Self-pay

## 2024-12-10 ENCOUNTER — Other Ambulatory Visit (HOSPITAL_BASED_OUTPATIENT_CLINIC_OR_DEPARTMENT_OTHER): Payer: Self-pay

## 2024-12-16 NOTE — Progress Notes (Unsigned)
 "     Amanda Console, Amanda [name] [address] [place], [place]  [address] Phone: [phone]   Gastroenterology Consultation  Referring Provider:     D'Mello, Rosalyn, DO Primary Care Physician:  D'Mello, Rosalyn, DO Primary Gastroenterologist:  Amanda Console, Amanda [name] / Elspeth Naval, MD  Reason for Consultation:     Abdominal pain, constipation, history of colon polyps        HPI:   Discussed the use of AI scribe software for clinical note transcription with the patient, who gave verbal consent to proceed.  Amanda [name] is a 73 year old female with type 2 diabetes mellitus and a history of colonic polyps who presents for evaluation of constipation and lower abdominal pain.  History of IBS.  Previous patient Dr. Teressa.  Wants to schedule repeat colonoscopy.  She has History of constipation, history of adenomatous colon polyps, diverticulosis, chronic intermittent diarrhea, history of H. Pylori gastritis (treated in 2022 with Pylera, eradicated).  07/12/2024 CT abdomen pelvis without contrast: 1. No acute intra-abdominal or pelvic pathology. No hydronephrosis or nephrolithiasis. 2. Cholelithiasis. 3.  Aortic Atherosclerosis  History of Present Illness Altered bowel habits - Recent transition from severe diarrhea to constipation. - Bowel movements occur as infrequently as two times per week. - Stool consistency varies from soft to hard with intermittent straining. - No blood in stool. - No current or recent black stools since discontinuing iron  supplementation two years ago. - No constipation medications used in the past two months; previously used a stool softener.  Abdominal symptoms - Mild, intermittent lower abdominal pain rated 3-4/10. - No current abdominal pain at the time of visit. - No heartburn or upper abdominal discomfort. - Increased burping without acid symptoms.  Weight loss - Unintentional weight loss of approximately 10 pounds over the past year. - No  intentional dieting. - Currently taking Jardiance  and Ozempic  for diabetes; Ozempic  started less than a year ago. - Attributes some weight loss to diabetes medications.  Colonic polyps and colonoscopy history - Last colonoscopy in 2020 with removal of one small benign polyp. - In 2015, two small precancerous polyps were removed. - No family history of colon cancer. - Patient is requesting to schedule 5-year repeat colonoscopy due to history of colon polyps.  Iron  supplementation history - Previously experienced black stools while on iron  supplementation, which resolved after discontinuing iron  two years ago. - No current iron  supplementation. - Iron  levels are stable.  05/2014 colonoscopy by Dr. Teressa: 2 small tubular adenoma polyps removed.  5-year repeat.  Biopsies negative for microscopic colitis.  12/2018 colonoscopy:  small benign polyp removed.  Biopsies negative for microscopic colitis.    05/2021 EGD by Dr. Teressa:  Small to medium amount of retained solid food was noted.  Mild nonspecific gastritis was found and biopsied.  Normal duodenum was biopsied.  Pathology was positive for H. pylori and she was called in a prescription for 10 days of pylera.  Biopsies negative for celiac.  Follow-up H. pylori stool test 08/2021 was negative, confirming eradication.  02/2023 LVEF 55 to 60%.  Past Medical History:  Diagnosis Date   Allergy    SEASONAL   Arthritis    Bacterial sinusitis 12/09/2023   Cervical radiculopathy due to degenerative joint disease of spine 05/03/2017   CKD (chronic kidney disease) stage 3, GFR 30-59 ml/min (HCC) 01/01/2021   Diabetes mellitus 1996   Diabetic neuropathy (HCC) 05/20/2013   Glaucoma    Grief 08/07/2023   H. pylori infection  Hyperlipidemia    Hypertension    Normocytic anemia 02/14/2013   Osteopenia 05/04/2016   Pain in both lower extremities 02/14/2024   Pain of both shoulder joints 11/14/2016   Pruritus 12/09/2023    Past Surgical  History:  Procedure Laterality Date   ABDOMINAL HYSTERECTOMY     10 years ago    BREAST BIOPSY     BREAST LUMPECTOMY WITH RADIOACTIVE SEED LOCALIZATION Right 02/16/2022   Procedure: RIGHT BREAST LUMPECTOMY WITH RADIOACTIVE SEED LOCALIZATION;  Surgeon: Vanderbilt Ned, MD;  Location: [facility] OR;  Service: General;  Laterality: Right;   Cataracts     ROTATOR CUFF REPAIR     SHOULDER SURGERY     Rotator cuff tear     Prior to Admission medications  Medication Sig Start Date End Date Taking? Authorizing Provider  Accu-Chek Softclix Lancets lancets Use to check blood sugar up to 1 time a day 09/12/24   Zheng, Michael, DO  acetaminophen  (TYLENOL ) 325 MG tablet Take 2 tablets (650 mg total) by mouth every 6 (six) hours as needed (or Fever >/= 101). 01/05/13   Dianna Pao, MD  atorvastatin  (LIPITOR) 40 MG tablet Take 1 tablet (40 mg total) by mouth daily. 03/15/24   [name], Katie, DO  benzonatate  (TESSALON  PERLES) 100 MG capsule Take 1 capsule (100 mg total) by mouth every 6 (six) hours as needed for cough. Take around bedtime/at night to help suppress cough. 06/05/24 06/05/25  Arellano Zameza, Priscila, MD  blood glucose meter kit and supplies KIT Dispense based on patient and insurance preference. Use up to four times daily as directed. . 09/30/19   Francesco Elsie NOVAK, MD  Blood Glucose Monitoring Suppl (ACCU-CHEK GUIDE) w/Device KIT Use as directed to check glood sugar levels once daily 09/12/24   Zheng, Michael, DO  diclofenac  Sodium (VOLTAREN ) 1 % GEL Apply 2 g topically daily as needed (hand pain). 11/17/22   Lemon Raisin, MD  empagliflozin  (JARDIANCE ) 25 MG TABS tablet Take 1 tablet (25 mg total) by mouth daily before breakfast. 05/27/24   Elicia Sharper, DO  fluticasone  (FLONASE ) 50 MCG/ACT nasal spray Place 1 spray into both nostrils daily. 06/05/24   Arellano Zameza, Priscila, MD  glucose 4 GM chewable tablet Chew 1 tablet (4 g total) by mouth as needed for low blood sugar. 02/06/23   [name], Izetta,  DO  glucose blood (ACCU-CHEK GUIDE TEST) test strip Check blood sugar up to 1 time a day 09/12/24   Elicia Sharper, DO  hydrocortisone  1 % lotion Apply 1 Application topically 2 (two) times daily as needed for itching. 04/26/24   Fernand Prost, MD  hydrOXYzine  (ATARAX ) 50 MG tablet Take 1 tablet (50 mg total) by mouth 3 (three) times daily as needed. 12/08/23   Fernand Prost, MD  latanoprost  (XALATAN ) 0.005 % ophthalmic solution Place 1 drop into both eyes at bedtime.    [provider]  latanoprost  (XALATAN ) 0.005 % ophthalmic solution Place 1 drop into both eyes daily. 12/19/23     Multiple Vitamins-Minerals (WOMENS 50+ MULTI VITAMIN/MIN) TABS Take 1 tablet by mouth daily. 03/18/14   Rosan Dayton BROCKS, DO  Olopatadine  HCl (PATADAY ) 0.7 % SOLN Place 1 drop into both eyes daily. 05/06/24     Semaglutide , 2 MG/DOSE, (OZEMPIC , 2 MG/DOSE,) 8 MG/3ML SOPN Inject 2 mg into the skin once a week. 02/13/24   Jolaine Pac, DO  timolol  (TIMOPTIC ) 0.5 % ophthalmic solution Place 1 drop into both eyes 2 (two) times daily. 10/09/24  Family History  Problem Relation Age of Onset   Diabetes Mother    Hypertension Mother    Breast cancer Mother    Cancer Mother    Diabetes Sister    Hypertension Sister    Kidney disease Sister        One Kidney transplant, one sister on HD   Breast cancer Sister    Cancer Sister    Hypertension Sister    Diabetes Sister    Cerebral palsy Brother    Cancer Brother        Unknown   Hypertension Daughter    Colon cancer Neg Hx    Esophageal cancer Neg Hx    Stomach cancer Neg Hx    Rectal cancer Neg Hx      Social History[1]  Allergies as of 12/17/2024 - Review Complete 12/17/2024  Allergen Reaction Noted   Gabapentin  Other (See Comments) 01/17/2014    Review of Systems:    All systems reviewed and negative except where noted in HPI.   Physical Exam:  BP 122/70   Pulse 78   Ht 5' 1.5 (1.562 m)   Wt 128 lb (58.1 kg)   BMI 23.79 kg/m  No LMP  recorded. Patient has had a hysterectomy.  General:   Alert,  Well-developed, well-nourished, pleasant and cooperative in NAD Lungs:  Respirations even and unlabored.  Clear throughout to auscultation.   No wheezes, crackles, or rhonchi. No acute distress. Heart:  Regular rate and rhythm; no murmurs, clicks, rubs, or gallops. Abdomen:  Normal bowel sounds.  No bruits.  Soft, and non-distended without masses, hepatosplenomegaly or hernias noted.  There is mild RLQ and LLQ, bilateral lower abdominal tenderness.  No upper abdominal tenderness.  No guarding or rebound tenderness.    Neurologic:  Alert and oriented x3;  grossly normal neurologically. Psych:  Alert and cooperative. Normal mood and affect.   Imaging Studies: No results found.  Labs: CBC    Component Value Date/Time   WBC 4.5 07/12/2024 1407   RBC 3.65 (L) 07/12/2024 1407   HGB 10.9 (L) 07/12/2024 1407   HGB 10.2 (L) 02/20/2023 1117   HCT 34.0 (L) 07/12/2024 1407   HCT 31.7 (L) 02/20/2023 1117   PLT 241 07/12/2024 1407   PLT 343 02/20/2023 1117   MCV 93.2 07/12/2024 1407   MCV 93 02/20/2023 1117    CMP     Component Value Date/Time   NA 137 07/12/2024 1407   NA 140 02/13/2024 1559   K 4.0 07/12/2024 1407   CL 106 07/12/2024 1407   CO2 23 07/12/2024 1407   GLUCOSE 110 (H) 07/12/2024 1407   BUN 35 (H) 07/12/2024 1407   BUN 36 (H) 02/13/2024 1559   CREATININE 1.79 (H) 07/12/2024 1407   CREATININE 0.97 06/16/2015 1553   CALCIUM  8.8 (L) 07/12/2024 1407   PROT 7.2 07/12/2024 1407   PROT 6.7 10/23/2017 1505   ALBUMIN 3.7 07/12/2024 1407   ALBUMIN 4.0 10/23/2017 1505   AST 22 07/12/2024 1407   ALT 13 07/12/2024 1407   ALKPHOS 54 07/12/2024 1407   BILITOT 0.9 07/12/2024 1407   BILITOT 0.2 10/23/2017 1505   GFRNONAA 30 (L) 07/12/2024 1407   GFRNONAA 63 06/16/2015 1553   GFRAA 43 (L) 12/30/2020 1349   GFRAA 72 06/16/2015 1553    Assessment and Plan:   Amanda [name] is a 73 y.o. y/o female has been referred  for: Assessment & Plan 1.  Constipation; suspect irritable bowel syndrome -constipation predominant  with episodes of diarrhea. Chronic constipation with recent changes in bowel habits and mild lower abdominal pain, likely due to decreased bowel frequency and altered stool consistency. - Prescribed docusate sodium  senna 50\8.6 mg, two tablets once daily at bedtime. - Recommend High Fiber diet with fruits, vegetables, and whole grains. - Drink 64 ounces of Fluids Daily.   2.  Personal history of colonic polyps Two small precancerous polyps removed in 2015 and one benign polyp in 2020. No family history of colorectal cancer.  -Scheduling Colonoscopy I discussed risks of colonoscopy with patient to include risk of bleeding, colon perforation, and risk of sedation.  Patient expressed understanding and agrees to proceed with colonoscopy.   3.  Anemia, unspecified History of iron  deficiency.  She has been off oral iron  for 2 years. - Labs: CBC, iron  panel, ferritin, B12, folate  4.  Chronic kidney disease - Lab: BMP.   Follow up in 3 months with TG for constipation and IBS.  Amanda Console, Amanda [name]       [1]  Social History Tobacco Use   Smoking status: Never   Smokeless tobacco: Never  Vaping Use   Vaping status: Never Used  Substance Use Topics   Alcohol use: No    Alcohol/week: 0.0 standard drinks of alcohol   Drug use: No   "

## 2024-12-17 ENCOUNTER — Encounter: Payer: Self-pay | Admitting: Physician Assistant

## 2024-12-17 ENCOUNTER — Ambulatory Visit (INDEPENDENT_AMBULATORY_CARE_PROVIDER_SITE_OTHER): Admitting: Physician Assistant

## 2024-12-17 ENCOUNTER — Other Ambulatory Visit (HOSPITAL_COMMUNITY): Payer: Self-pay

## 2024-12-17 ENCOUNTER — Other Ambulatory Visit (INDEPENDENT_AMBULATORY_CARE_PROVIDER_SITE_OTHER)

## 2024-12-17 VITALS — BP 122/70 | HR 78 | Ht 61.5 in | Wt 128.0 lb

## 2024-12-17 DIAGNOSIS — N1832 Chronic kidney disease, stage 3b: Secondary | ICD-10-CM

## 2024-12-17 DIAGNOSIS — N189 Chronic kidney disease, unspecified: Secondary | ICD-10-CM | POA: Diagnosis not present

## 2024-12-17 DIAGNOSIS — D649 Anemia, unspecified: Secondary | ICD-10-CM

## 2024-12-17 DIAGNOSIS — K5909 Other constipation: Secondary | ICD-10-CM

## 2024-12-17 DIAGNOSIS — K5901 Slow transit constipation: Secondary | ICD-10-CM

## 2024-12-17 DIAGNOSIS — Z860100 Personal history of colon polyps, unspecified: Secondary | ICD-10-CM | POA: Diagnosis not present

## 2024-12-17 DIAGNOSIS — R1030 Lower abdominal pain, unspecified: Secondary | ICD-10-CM | POA: Diagnosis not present

## 2024-12-17 DIAGNOSIS — R109 Unspecified abdominal pain: Secondary | ICD-10-CM

## 2024-12-17 LAB — B12 AND FOLATE PANEL
Folate: 11.9 ng/mL
Vitamin B-12: 752 pg/mL (ref 211–911)

## 2024-12-17 LAB — BASIC METABOLIC PANEL WITH GFR
BUN: 27 mg/dL — ABNORMAL HIGH (ref 6–23)
CO2: 29 meq/L (ref 19–32)
Calcium: 9.2 mg/dL (ref 8.4–10.5)
Chloride: 108 meq/L (ref 96–112)
Creatinine, Ser: 1.61 mg/dL — ABNORMAL HIGH (ref 0.40–1.20)
GFR: 31.81 mL/min — ABNORMAL LOW
Glucose, Bld: 137 mg/dL — ABNORMAL HIGH (ref 70–99)
Potassium: 4.6 meq/L (ref 3.5–5.1)
Sodium: 143 meq/L (ref 135–145)

## 2024-12-17 LAB — CBC WITH DIFFERENTIAL/PLATELET
Basophils Absolute: 0 K/uL (ref 0.0–0.1)
Basophils Relative: 0.5 % (ref 0.0–3.0)
Eosinophils Absolute: 0.1 K/uL (ref 0.0–0.7)
Eosinophils Relative: 1 % (ref 0.0–5.0)
HCT: 33.7 % — ABNORMAL LOW (ref 36.0–46.0)
Hemoglobin: 11.6 g/dL — ABNORMAL LOW (ref 12.0–15.0)
Lymphocytes Relative: 30.2 % (ref 12.0–46.0)
Lymphs Abs: 1.7 K/uL (ref 0.7–4.0)
MCHC: 34.4 g/dL (ref 30.0–36.0)
MCV: 89.9 fl (ref 78.0–100.0)
Monocytes Absolute: 0.4 K/uL (ref 0.1–1.0)
Monocytes Relative: 7.4 % (ref 3.0–12.0)
Neutro Abs: 3.4 K/uL (ref 1.4–7.7)
Neutrophils Relative %: 60.9 % (ref 43.0–77.0)
Platelets: 284 K/uL (ref 150.0–400.0)
RBC: 3.75 Mil/uL — ABNORMAL LOW (ref 3.87–5.11)
RDW: 13.1 % (ref 11.5–15.5)
WBC: 5.7 K/uL (ref 4.0–10.5)

## 2024-12-17 MED ORDER — SENNOSIDES-DOCUSATE SODIUM 8.6-50 MG PO TABS
2.0000 | ORAL_TABLET | Freq: Every day | ORAL | 11 refills | Status: AC
  Filled 2024-12-17: qty 60, 30d supply, fill #0

## 2024-12-17 NOTE — Patient Instructions (Addendum)
 We have sent the following medications to your pharmacy for you to pick up at your convenience: Senokot-S 8.6-50 mg take 2 tablets at bedtime  Please bring your Glucose Chewable tablets with you on the day of your procedure.  You have been scheduled for a Colonoscopy. Please follow written instructions given to you at your visit today.   If you use inhalers (even only as needed), please bring them with you on the day of your procedure.  DO NOT TAKE 7 DAYS PRIOR TO TEST- Trulicity  (dulaglutide ) Ozempic , Wegovy  (semaglutide ) Mounjaro (tirzepatide) Bydureon Bcise (exanatide extended release)  DO NOT TAKE 1 DAY PRIOR TO YOUR TEST Rybelsus  (semaglutide ) Adlyxin  (lixisenatide ) Victoza  (liraglutide ) Byetta (exanatide) ___________________________________________________________________________  Please follow up sooner if symptoms increase or worsen   Due to recent changes in healthcare laws, you may see the results of your imaging and laboratory studies on MyChart before your provider has had a chance to review them.  We understand that in some cases there may be results that are confusing or concerning to you. Not all laboratory results come back in the same time frame and the provider may be waiting for multiple results in order to interpret others.  Please give us  48 hours in order for your provider to thoroughly review all the results before contacting the office for clarification of your results.   Thank you for trusting me with your gastrointestinal care!   Ellouise Console, PA-C _______________________________________________________  If your blood pressure at your visit was 140/90 or greater, please contact your primary care physician to follow up on this.  _______________________________________________________  If you are age 72 or older, your body mass index should be between 23-30. Your Body mass index is 23.79 kg/m. If this is out of the aforementioned range listed, please  consider follow up with your Primary Care Provider.  If you are age 72 or younger, your body mass index should be between 19-25. Your Body mass index is 23.79 kg/m. If this is out of the aformentioned range listed, please consider follow up with your Primary Care Provider.   ________________________________________________________  The [facility] providers would like to encourage you to use MYCHART to communicate with providers for non-urgent requests or questions.  Due to long hold times on the telephone, sending your provider a message by [facility] may be a faster and more efficient way to get a response.  Please allow 48 business hours for a response.  Please remember that this is for non-urgent requests.  _______________________________________________________

## 2024-12-18 LAB — IRON,TIBC AND FERRITIN PANEL
%SAT: 28 % (ref 16–45)
Ferritin: 55 ng/mL (ref 16–288)
Iron: 71 ug/dL (ref 45–160)
TIBC: 256 ug/dL (ref 250–450)

## 2024-12-19 NOTE — Progress Notes (Signed)
 Agree with assessment and plan as outlined.

## 2024-12-20 ENCOUNTER — Telehealth: Payer: Self-pay | Admitting: Physician Assistant

## 2024-12-20 NOTE — Telephone Encounter (Signed)
 Inbound call from patient stating that she is not able to get access to her MyChart and would like to speak to the nurse in regards to some lab work she had done. Patient is requesting a call back. Please advise.

## 2024-12-20 NOTE — Telephone Encounter (Signed)
 I have spoken to patient to advise that labs have not yet been reviewed by T.Garrett, PA-C, but as soon as reviewed, we will reach out with those results and recommendations. She verbalizes understanding. Also provided password reset information for MyChart.

## 2024-12-20 NOTE — Telephone Encounter (Signed)
 Inbound call from patient calling to schedule her colonoscopy. Patient was schedule for February the 18 th at 2:00 PM. Patient is requesting to get her prep medication sent over to the pharmacy. Please advise.

## 2024-12-23 ENCOUNTER — Other Ambulatory Visit (HOSPITAL_COMMUNITY): Payer: Self-pay

## 2024-12-23 ENCOUNTER — Ambulatory Visit: Payer: Self-pay | Admitting: Physician Assistant

## 2024-12-23 MED ORDER — NA SULFATE-K SULFATE-MG SULF 17.5-3.13-1.6 GM/177ML PO SOLN
1.0000 | Freq: Once | ORAL | 0 refills | Status: AC
  Filled 2024-12-23: qty 354, 2d supply, fill #0

## 2024-12-23 NOTE — Telephone Encounter (Signed)
 I have sent the prep to the pharmacy. The patient was given blank instructions on the day of her visit and new instructions with the date and time have been sent through MyChart.

## 2024-12-23 NOTE — Progress Notes (Signed)
 Call and notify patient labs show: 1.  Chronic kidney disease.  Kidney function is decreased, yet stable and improved compared to labs done 5 months ago.  Continue to avoid all NSAIDs.   2.  Mild low hemoglobin 11.6, which is stable and improved compared to previous labs.  This is most likely anemia of chronic kidney disease. 3.  Iron  studies, vitamin B12, and folate are all normal.  No evidence of iron , B12, or folate deficiencies.  I recommend: - Continue with plan for colonoscopy.   - Continue constipation medication. - Follow-up with PCP to monitor kidney function labs.  Ellouise Console, PA-C

## 2024-12-23 NOTE — Addendum Note (Signed)
 Addended by: LANETTE ALETHEA CROME on: 12/23/2024 08:29 AM   Modules accepted: Orders

## 2024-12-24 ENCOUNTER — Other Ambulatory Visit (HOSPITAL_COMMUNITY): Payer: Self-pay

## 2024-12-25 ENCOUNTER — Ambulatory Visit: Admitting: Physician Assistant

## 2024-12-25 ENCOUNTER — Encounter: Payer: Self-pay | Admitting: Physician Assistant

## 2024-12-25 ENCOUNTER — Encounter: Payer: Self-pay | Admitting: Podiatry

## 2024-12-25 ENCOUNTER — Other Ambulatory Visit: Payer: Self-pay | Admitting: Physician Assistant

## 2024-12-25 ENCOUNTER — Ambulatory Visit: Admitting: Podiatry

## 2024-12-25 VITALS — BP 147/89

## 2024-12-25 DIAGNOSIS — D229 Melanocytic nevi, unspecified: Secondary | ICD-10-CM

## 2024-12-25 DIAGNOSIS — M79675 Pain in left toe(s): Secondary | ICD-10-CM

## 2024-12-25 DIAGNOSIS — B351 Tinea unguium: Secondary | ICD-10-CM | POA: Diagnosis not present

## 2024-12-25 DIAGNOSIS — M79674 Pain in right toe(s): Secondary | ICD-10-CM

## 2024-12-25 DIAGNOSIS — D2271 Melanocytic nevi of right lower limb, including hip: Secondary | ICD-10-CM | POA: Diagnosis not present

## 2024-12-25 DIAGNOSIS — E1142 Type 2 diabetes mellitus with diabetic polyneuropathy: Secondary | ICD-10-CM | POA: Diagnosis not present

## 2024-12-25 DIAGNOSIS — D2272 Melanocytic nevi of left lower limb, including hip: Secondary | ICD-10-CM | POA: Diagnosis not present

## 2024-12-25 DIAGNOSIS — Z1231 Encounter for screening mammogram for malignant neoplasm of breast: Secondary | ICD-10-CM

## 2024-12-25 NOTE — Progress Notes (Signed)
" ° °  New Patient Visit   Subjective  Amanda [name] is a 74 y.o. female NEW PATIENT who presents for the following: Spot of right foot that she was not aware of - her foot doctor noticed and referred.     The following portions of the chart were reviewed this encounter and updated as appropriate: medications, allergies, medical history  Review of Systems:  No other skin or systemic complaints except as noted in HPI or Assessment and Plan.  Objective  Well appearing patient in no apparent distress; mood and affect are within normal limits.   A focused examination was performed of the following areas: Right foot  Relevant exam findings are noted in the Assessment and Plan.  Bilateral feet Lemonds macules  Assessment & Plan     ACRAL COMPOUND NEVUS Bilateral feet Benign-appearing.  Observation.  Call clinic for new or changing lesions.  Recommend daily use of broad spectrum spf 30+ sunscreen to sun-exposed areas.    Return if symptoms worsen or fail to improve.  I, Roseline Hutchinson, CMA, am acting as scribe for Ted Leonhart K, PA-C .   Documentation: I have reviewed the above documentation for accuracy and completeness, and I agree with the above.  Tesean Stump K, PA-C    "

## 2024-12-25 NOTE — Patient Instructions (Signed)

## 2025-01-01 NOTE — Progress Notes (Signed)
"  °  Subjective:  Patient ID: Amanda [name], female    DOB: 08/15/1951,  MRN: [id number]  Amanda [name] presents to clinic today for at risk foot care with history of diabetic neuropathy and painful mycotic toenails of both feet that are difficult to trim. Pain interferes with daily activities and wearing enclosed shoe gear comfortably.  Chief Complaint  Patient presents with   [facility]    Rm16 Diabetic foot care A1c 7.5/ Dr. Remonia D'Mello last visit October 2025   New problem(s): None.   PCP is D'Mello, Rosalyn, DO.  Allergies[1]  Review of Systems: Negative except as noted in the HPI.  Objective: No changes noted in today's physical examination. There were no vitals filed for this visit. Amanda [name] is a pleasant 74 y.o. female WD, WN in NAD. AAO x 3.  Vascular Examination: Capillary refill time <3 seconds b/l LE. Palpable pedal pulses b/l LE. Digital hair present b/l. No pedal edema b/l. Skin temperature gradient WNL b/l. No varicosities b/l. No cyanosis or clubbing. No ischemia or gangrene. .  Dermatological Examination: Pedal skin with normal turgor, texture and tone b/l. No open wounds. No interdigital macerations b/l. Toenails 1-5 b/l thickened, discolored, dystrophic with subungual debris. There is pain on palpation to dorsal aspect of nailplates. Brownish macule noted plantar sulcus under 5th digit. Mild asymmetry, normal borders. No palpable depth, no elevation, no bleeding.  Neurological Examination: Protective sensation intact with 10 gram monofilament b/l LE. Vibratory sensation intact b/l LE. Pt has subjective symptoms of neuropathy.  Musculoskeletal Examination: Muscle strength 5/5 to all lower extremity muscle groups bilaterally. Hammertoe(s) b/l 5th toes.. No pain, crepitus or joint limitation noted with ROM b/l LE.  Patient ambulates independently without assistive aids.  Assessment/Plan: 1. Pain due to onychomycosis of toenails of both feet   2. Diabetic  peripheral neuropathy associated with type 2 diabetes mellitus [facility])     Consent given for treatment. Patient examined. All patient's and/or POA's questions/concerns addressed on today's visit. Toenails 1-5 b/l debrided in length and girth without incident. Continue foot and shoe inspections daily. Monitor blood glucose per PCP/Endocrinologist's recommendations. Continue soft, supportive shoe gear daily. Report any pedal injuries to medical professional. Call office if there are any questions/concerns. -Patient/POA to call should there be question/concern in the interim.   Return in about 3 months (around 03/25/2025).  Delon LITTIE Merlin, DPM      [place] LOCATION: [address], [place] [address]                   Office [phone]   [place] LOCATION: [address] [place], [place] [address] Office [phone]     [1]  Allergies Allergen Reactions   Gabapentin  Other (See Comments)    Pt states medication made her feel out of it.   Pt states medication made her feel out of it.     "

## 2025-01-02 ENCOUNTER — Ambulatory Visit: Payer: Self-pay

## 2025-01-02 NOTE — Telephone Encounter (Signed)
" ° °  FYI Only or Action Required?: FYI only for provider: appointment scheduled on 1/16.  Patient was last seen in primary care on 11/27/2024 by Leontine Lapine, MD.  Called Nurse Triage reporting Hypertension.  Symptoms began today.  Interventions attempted: Nothing.  Symptoms are: stable.  Triage Disposition: See PCP When Office is Open (Within 3 Days)  Patient/caregiver understands and will follow disposition?: Yes   Reason for Triage: Patient has been experiencing elevated levels of blood pressure for the past couple of days but today her readings have been the highest. She states she's not taking any medications for BP.  Todays readings were 200/103  Yesterdays 174/99   Reason for Disposition  Systolic BP >= 160 OR Diastolic >= 100  Answer Assessment - Initial Assessment Questions 1. BLOOD PRESSURE: What is your blood pressure? Did you take at least two measurements 5 minutes apart?     174/99 2. ONSET: When did you take your blood pressure?     today 3. HOW: How did you take your blood pressure? (e.g., automatic home BP monitor, visiting nurse)     Auto cuff on arm 4. HISTORY: Do you have a history of high blood pressure?     yes 5. MEDICINES: Are you taking any medicines for blood pressure? Have you missed any doses recently?     Not taking meds at this time 6. OTHER SYMPTOMS: Do you have any symptoms? (e.g., blurred vision, chest pain, difficulty breathing, headache, weakness)     Denies CP, SOB, numbness/ tingling/ weakness.  7. PREGNANCY: Is there any chance you are pregnant? When was your last menstrual period?  Protocols used: Blood Pressure - High-A-AH  "

## 2025-01-02 NOTE — Telephone Encounter (Signed)
 RTC to patient.  Stated has been off her B/P med for about  3 months.   No headaches or dizziness or blurried  noted.   Patient stated has been under a lot of stress lately.

## 2025-01-03 ENCOUNTER — Other Ambulatory Visit (HOSPITAL_COMMUNITY): Payer: Self-pay

## 2025-01-03 ENCOUNTER — Ambulatory Visit: Payer: Self-pay

## 2025-01-03 ENCOUNTER — Ambulatory Visit: Admitting: *Deleted

## 2025-01-03 MED ORDER — LOSARTAN POTASSIUM 25 MG PO TABS
25.0000 mg | ORAL_TABLET | Freq: Every day | ORAL | 0 refills | Status: DC
  Filled 2025-01-03 – 2025-01-09 (×2): qty 30, 30d supply, fill #0

## 2025-01-03 NOTE — Progress Notes (Addendum)
" ° ° °  Amanda [name] presented today for blood pressure check. Patient is not prescribed blood pressure medications; stated she has been off medication for about 3 months. Blood pressure was taken in the usual and appropriate manner using an automated BP cuff.    Vitals:   01/03/25 1042 01/03/25 1050  BP: (!) 171/84 (!) 186/78   Dr Kem is in the office and made aware of BP readings. Pt has scheduled an appointment to return on 01/10/25.   Results of todays visit will be routed to Dr.D'Mello for review and further management.     BP visit, but needs appointment for next week for further management. Denies any chest pain, SOB, lower extremity edema, major headache.  Started losartan  25 mg and asked patient to bring in BP cuff and log at next visit - Dr. Kem     "

## 2025-01-06 ENCOUNTER — Telehealth: Payer: Self-pay | Admitting: *Deleted

## 2025-01-06 NOTE — Telephone Encounter (Signed)
 Mammogram appointment 02-19-2025 11:20 am to arrive 10:50 am / breast center [phone]. Appointment reminder mailed to the patient -attached is the information regarding the  75.00 no show fee.

## 2025-01-09 ENCOUNTER — Other Ambulatory Visit (HOSPITAL_COMMUNITY): Payer: Self-pay

## 2025-01-10 ENCOUNTER — Other Ambulatory Visit (HOSPITAL_COMMUNITY): Payer: Self-pay

## 2025-01-10 ENCOUNTER — Ambulatory Visit: Payer: Self-pay

## 2025-01-10 VITALS — BP 152/85 | HR 69 | Temp 97.7°F | Ht 61.5 in | Wt 129.0 lb

## 2025-01-10 DIAGNOSIS — I10 Essential (primary) hypertension: Secondary | ICD-10-CM | POA: Diagnosis not present

## 2025-01-10 DIAGNOSIS — Z79899 Other long term (current) drug therapy: Secondary | ICD-10-CM

## 2025-01-10 MED ORDER — LOSARTAN POTASSIUM 25 MG PO TABS
12.5000 mg | ORAL_TABLET | Freq: Every day | ORAL | 3 refills | Status: DC
  Filled 2025-01-10: qty 30, 60d supply, fill #0

## 2025-01-10 MED ORDER — LOSARTAN POTASSIUM 25 MG PO TABS
12.5000 mg | ORAL_TABLET | Freq: Two times a day (BID) | ORAL | 3 refills | Status: AC
  Filled 2025-01-10: qty 30, 30d supply, fill #0

## 2025-01-10 NOTE — Progress Notes (Addendum)
 BP- 152/85 P-69 (Office's). Pt checked with her own meters BP -146/85 P- 74 and BP - 166/85 P-73.

## 2025-01-10 NOTE — Patient Instructions (Addendum)
 Today we discussed the following medical conditions and plan:   For your blood pressure take losartan  12.5 mg twice a day! Thank you for noting down all your blood pressures. Try to take it 3 times a day if you can  We will get labs at your next visit   We look forward to seeing you next time. Please call our clinic at [phone] if you have any questions or concerns. The best time to call is Monday-Friday from 9am-4pm, but there is someone available 24/7. If you need medication refills, please notify your pharmacy one week in advance and they will send us  a request.   Thank you for trusting me with your care. Wishing you the best!   Letina Luckett D'Mello, DO  [facility]

## 2025-01-10 NOTE — Progress Notes (Unsigned)
" ° °  Established Patient Office Visit  Subjective   Patient ID: Amanda [name], female    DOB: 04-Aug-1951  Age: 74 y.o. MRN: [id number]  Chief Complaint  Patient presents with   Follow-up    Blood pressure.    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  {Labs (Optional):23779}  The 10-year ASCVD risk score (Arnett DK, et al., 2019) is: 32.8%    Assessment & Plan:   Problem List Items Addressed This Visit   None A1c  7.5 last July   BP Losartan  25 mg started last week, takes it at the same time  GFR 31 February 18   No follow-ups on file.    Gerad Cornelio D'Mello, DO  "

## 2025-01-14 ENCOUNTER — Other Ambulatory Visit: Payer: Self-pay

## 2025-01-14 DIAGNOSIS — E785 Hyperlipidemia, unspecified: Secondary | ICD-10-CM

## 2025-01-14 MED ORDER — ATORVASTATIN CALCIUM 40 MG PO TABS
40.0000 mg | ORAL_TABLET | Freq: Every day | ORAL | 2 refills | Status: AC
  Filled 2025-01-14: qty 90, 90d supply, fill #0

## 2025-01-14 NOTE — Telephone Encounter (Signed)
 Medication sent to pharmacy

## 2025-01-14 NOTE — Assessment & Plan Note (Signed)
 Patient was started on losartan  25 mg last week.  Patient denies any dizziness or lightheadedness since starting the losartan . She brought in both of the blood pressure cuffs she uses and her BP log. One machine was showing about 10 systolic higher and one machine was about 10 systolic lower.  Likely actual blood pressure reading is an average of both of these. Does seem like from log that patient's BP is highest in the morning right before she takes her medicine, with systolic in the 170s-180s. Likely needs to split this dose, to take one dose in the morning and then the evening. Can be titrated up from there  Plan: Losartan  12.5 mg BID Continue checking BP and bring in BP log at next visit BMP at next visit

## 2025-01-15 NOTE — Progress Notes (Signed)
 Internal Medicine Clinic Attending  Case discussed with the resident at the time of the visit.  We reviewed the resident's history and exam and pertinent patient test results.  I agree with the assessment, diagnosis, and plan of care documented in the resident's note.

## 2025-01-27 ENCOUNTER — Ambulatory Visit: Payer: Self-pay | Admitting: Dietician

## 2025-02-05 ENCOUNTER — Encounter: Admitting: Gastroenterology

## 2025-02-19 ENCOUNTER — Ambulatory Visit

## 2025-02-24 ENCOUNTER — Ambulatory Visit: Payer: Self-pay

## 2025-04-02 ENCOUNTER — Ambulatory Visit: Admitting: Podiatry

## 2025-09-03 ENCOUNTER — Ambulatory Visit
